# Patient Record
Sex: Male | Born: 1950
Health system: Southern US, Community
[De-identification: ages and names within clinical notes are randomized; demographics above are authoritative.]

## PROBLEM LIST (undated history)

## (undated) DIAGNOSIS — J9600 Acute respiratory failure, unspecified whether with hypoxia or hypercapnia: Secondary | ICD-10-CM

## (undated) DIAGNOSIS — R05 Cough: Secondary | ICD-10-CM

## (undated) DIAGNOSIS — K922 Gastrointestinal hemorrhage, unspecified: Secondary | ICD-10-CM

## (undated) DIAGNOSIS — I509 Heart failure, unspecified: Secondary | ICD-10-CM

## (undated) DIAGNOSIS — J189 Pneumonia, unspecified organism: Secondary | ICD-10-CM

## (undated) DIAGNOSIS — G4700 Insomnia, unspecified: Secondary | ICD-10-CM

## (undated) DIAGNOSIS — I729 Aneurysm of unspecified site: Secondary | ICD-10-CM

## (undated) DIAGNOSIS — E876 Hypokalemia: Secondary | ICD-10-CM

## (undated) DIAGNOSIS — R059 Cough, unspecified: Secondary | ICD-10-CM

## (undated) DIAGNOSIS — J449 Chronic obstructive pulmonary disease, unspecified: Secondary | ICD-10-CM

## (undated) DIAGNOSIS — M109 Gout, unspecified: Secondary | ICD-10-CM

## (undated) DIAGNOSIS — K219 Gastro-esophageal reflux disease without esophagitis: Secondary | ICD-10-CM

## (undated) DIAGNOSIS — R040 Epistaxis: Secondary | ICD-10-CM

## (undated) DIAGNOSIS — D649 Anemia, unspecified: Secondary | ICD-10-CM

## (undated) DIAGNOSIS — T7840XA Allergy, unspecified, initial encounter: Secondary | ICD-10-CM

## (undated) DIAGNOSIS — M6240 Contracture of muscle, unspecified site: Secondary | ICD-10-CM

## (undated) DIAGNOSIS — E785 Hyperlipidemia, unspecified: Secondary | ICD-10-CM

## (undated) DIAGNOSIS — J40 Bronchitis, not specified as acute or chronic: Secondary | ICD-10-CM

## (undated) DIAGNOSIS — I10 Essential (primary) hypertension: Secondary | ICD-10-CM

## (undated) DIAGNOSIS — I739 Peripheral vascular disease, unspecified: Secondary | ICD-10-CM

## (undated) DIAGNOSIS — L8990 Pressure ulcer of unspecified site, unspecified stage: Secondary | ICD-10-CM

## (undated) DIAGNOSIS — I82409 Acute embolism and thrombosis of unspecified deep veins of unspecified lower extremity: Secondary | ICD-10-CM

## (undated) HISTORY — PX: EYE SURGERY: SHX253

## (undated) HISTORY — DX: Allergy, unspecified, initial encounter: T78.40XA

## (undated) HISTORY — DX: Anemia, unspecified: D64.9

## (undated) HISTORY — DX: Gastrointestinal hemorrhage, unspecified: K92.2

## (undated) HISTORY — PX: CARDIAC CATHETERIZATION: SHX172

---

## 2001-10-25 ENCOUNTER — Inpatient Hospital Stay (HOSPITAL_COMMUNITY): Admission: EM | Admit: 2001-10-25 | Discharge: 2001-11-02 | Payer: Self-pay | Admitting: Emergency Medicine

## 2001-10-25 ENCOUNTER — Encounter: Payer: Self-pay | Admitting: Emergency Medicine

## 2001-10-26 ENCOUNTER — Encounter: Payer: Self-pay | Admitting: Internal Medicine

## 2001-10-29 ENCOUNTER — Encounter: Payer: Self-pay | Admitting: Internal Medicine

## 2012-11-29 ENCOUNTER — Emergency Department: Payer: Self-pay | Admitting: Emergency Medicine

## 2012-11-29 LAB — COMPREHENSIVE METABOLIC PANEL
Albumin: 3.7 g/dL (ref 3.4–5.0)
Calcium, Total: 9.1 mg/dL (ref 8.5–10.1)
Chloride: 104 mmol/L (ref 98–107)
Creatinine: 0.98 mg/dL (ref 0.60–1.30)
EGFR (Non-African Amer.): >60
Glucose: 129 mg/dL — ABNORMAL HIGH (ref 65–99)
Osmolality: 272 (ref 275–301)
Potassium: 5.3 mmol/L — ABNORMAL HIGH (ref 3.5–5.1)
SGPT (ALT): 28 U/L (ref 12–78)
Sodium: 135 mmol/L — ABNORMAL LOW (ref 136–145)
Total Protein: 8.2 g/dL (ref 6.4–8.2)

## 2012-11-29 LAB — URINALYSIS, COMPLETE
Hyaline Cast: 3
Ph: 5 (ref 4.5–8.0)
Protein: NEGATIVE
RBC,UR: 3 /HPF (ref 0–5)
Specific Gravity: 1.017 (ref 1.003–1.030)
Squamous Epithelial: NONE SEEN

## 2012-11-29 LAB — CBC
HGB: 15.1 g/dL (ref 13.0–18.0)
MCH: 28.4 pg (ref 26.0–34.0)
MCHC: 32.8 g/dL (ref 32.0–36.0)
Platelet: 230 10*3/uL (ref 150–440)
RBC: 5.32 10*6/uL (ref 4.40–5.90)
RDW: 15.6 % — ABNORMAL HIGH (ref 11.5–14.5)
WBC: 12.9 10*3/uL — ABNORMAL HIGH (ref 3.8–10.6)

## 2012-11-29 LAB — ETHANOL: Ethanol: <3 mg/dL

## 2013-03-18 ENCOUNTER — Ambulatory Visit: Payer: Self-pay | Admitting: Internal Medicine

## 2013-03-21 ENCOUNTER — Inpatient Hospital Stay: Payer: Self-pay | Admitting: Internal Medicine

## 2013-03-21 LAB — COMPREHENSIVE METABOLIC PANEL
Albumin: 1.5 g/dL — ABNORMAL LOW (ref 3.4–5.0)
Alkaline Phosphatase: 86 U/L (ref 50–136)
Anion Gap: 7 (ref 7–16)
BUN: 38 mg/dL — ABNORMAL HIGH (ref 7–18)
Bilirubin,Total: 0.8 mg/dL (ref 0.2–1.0)
Calcium, Total: 8.6 mg/dL (ref 8.5–10.1)
Chloride: 98 mmol/L (ref 98–107)
Co2: 29 mmol/L (ref 21–32)
Creatinine: 1.44 mg/dL — ABNORMAL HIGH (ref 0.60–1.30)
EGFR (African American): 60 — ABNORMAL LOW
EGFR (Non-African Amer.): 52 — ABNORMAL LOW
Glucose: 150 mg/dL — ABNORMAL HIGH (ref 65–99)
Osmolality: 280 (ref 275–301)
Potassium: 4.6 mmol/L (ref 3.5–5.1)
SGOT(AST): 63 U/L — ABNORMAL HIGH (ref 15–37)
SGPT (ALT): 32 U/L (ref 12–78)
Sodium: 134 mmol/L — ABNORMAL LOW (ref 136–145)
Total Protein: 8.2 g/dL (ref 6.4–8.2)

## 2013-03-21 LAB — TROPONIN I: Troponin-I: <0.02 ng/mL

## 2013-03-21 LAB — CBC
HCT: 32.9 % — ABNORMAL LOW (ref 40.0–52.0)
HGB: 11 g/dL — ABNORMAL LOW (ref 13.0–18.0)
MCH: 27.2 pg (ref 26.0–34.0)
MCHC: 33.4 g/dL (ref 32.0–36.0)
MCV: 81 fL (ref 80–100)
Platelet: 362 10*3/uL (ref 150–440)
RBC: 4.04 10*6/uL — ABNORMAL LOW (ref 4.40–5.90)
RDW: 13.7 % (ref 11.5–14.5)
WBC: 28 10*3/uL — ABNORMAL HIGH (ref 3.8–10.6)

## 2013-03-21 LAB — CK TOTAL AND CKMB (NOT AT ARMC)
CK, Total: 95 U/L (ref 35–232)
CK-MB: 1.5 ng/mL (ref 0.5–3.6)

## 2013-03-21 LAB — PRO B NATRIURETIC PEPTIDE: B-Type Natriuretic Peptide: 1862 pg/mL — ABNORMAL HIGH (ref 0–125)

## 2013-03-22 LAB — CBC WITH DIFFERENTIAL/PLATELET
Basophil %: 0.1 %
Eosinophil #: 0 10*3/uL (ref 0.0–0.7)
Eosinophil %: 0 %
HCT: 29.8 % — ABNORMAL LOW (ref 40.0–52.0)
Lymphocyte #: 0.9 10*3/uL — ABNORMAL LOW (ref 1.0–3.6)
Lymphocyte %: 3.7 %
MCH: 27 pg (ref 26.0–34.0)
MCHC: 33 g/dL (ref 32.0–36.0)
MCV: 82 fL (ref 80–100)
Monocyte #: 1.5 x10 3/mm — ABNORMAL HIGH (ref 0.2–1.0)
Monocyte %: 6.3 %
Neutrophil #: 21.7 10*3/uL — ABNORMAL HIGH (ref 1.4–6.5)
Platelet: 335 10*3/uL (ref 150–440)
RBC: 3.64 10*6/uL — ABNORMAL LOW (ref 4.40–5.90)
RDW: 13.8 % (ref 11.5–14.5)

## 2013-03-22 LAB — BASIC METABOLIC PANEL
Calcium, Total: 8 mg/dL — ABNORMAL LOW (ref 8.5–10.1)
Co2: 25 mmol/L (ref 21–32)
Creatinine: 1.03 mg/dL (ref 0.60–1.30)
EGFR (African American): >60
EGFR (Non-African Amer.): >60
Glucose: 157 mg/dL — ABNORMAL HIGH (ref 65–99)
Osmolality: 278 (ref 275–301)
Potassium: 4 mmol/L (ref 3.5–5.1)

## 2013-03-23 LAB — CBC WITH DIFFERENTIAL/PLATELET
Basophil %: 0.3 %
HCT: 31.4 % — ABNORMAL LOW (ref 40.0–52.0)
Lymphocyte #: 1 10*3/uL (ref 1.0–3.6)
Lymphocyte %: 4.9 %
MCV: 83 fL (ref 80–100)
Neutrophil #: 17.9 10*3/uL — ABNORMAL HIGH (ref 1.4–6.5)
RBC: 3.8 10*6/uL — ABNORMAL LOW (ref 4.40–5.90)
RDW: 14 % (ref 11.5–14.5)
WBC: 20.5 10*3/uL — ABNORMAL HIGH (ref 3.8–10.6)

## 2013-03-24 DIAGNOSIS — I369 Nonrheumatic tricuspid valve disorder, unspecified: Secondary | ICD-10-CM

## 2013-03-24 LAB — BASIC METABOLIC PANEL
Anion Gap: 6 — ABNORMAL LOW (ref 7–16)
BUN: 25 mg/dL — ABNORMAL HIGH (ref 7–18)
Calcium, Total: 8.2 mg/dL — ABNORMAL LOW (ref 8.5–10.1)
Co2: 31 mmol/L (ref 21–32)
Creatinine: 1.14 mg/dL (ref 0.60–1.30)
EGFR (African American): >60
EGFR (Non-African Amer.): >60
Glucose: 190 mg/dL — ABNORMAL HIGH (ref 65–99)
Osmolality: 287 (ref 275–301)

## 2013-03-24 LAB — PRO B NATRIURETIC PEPTIDE: B-Type Natriuretic Peptide: 7175 pg/mL — ABNORMAL HIGH (ref 0–125)

## 2013-03-25 LAB — CBC WITH DIFFERENTIAL/PLATELET
Basophil %: 0.2 %
Eosinophil #: 0 10*3/uL (ref 0.0–0.7)
Eosinophil %: 0 %
HCT: 29 % — ABNORMAL LOW (ref 40.0–52.0)
Lymphocyte #: 1.4 10*3/uL (ref 1.0–3.6)
MCHC: 33.1 g/dL (ref 32.0–36.0)
MCV: 81 fL (ref 80–100)
Neutrophil %: 89.4 %
Platelet: 354 10*3/uL (ref 150–440)
RBC: 3.59 10*6/uL — ABNORMAL LOW (ref 4.40–5.90)
WBC: 20.2 10*3/uL — ABNORMAL HIGH (ref 3.8–10.6)

## 2013-03-25 LAB — MAGNESIUM: Magnesium: 2.5 mg/dL — ABNORMAL HIGH

## 2013-03-25 LAB — PHOSPHORUS: Phosphorus: 2.5 mg/dL (ref 2.5–4.9)

## 2013-03-25 LAB — COMPREHENSIVE METABOLIC PANEL
Anion Gap: 6 — ABNORMAL LOW (ref 7–16)
BUN: 27 mg/dL — ABNORMAL HIGH (ref 7–18)
Bilirubin,Total: 0.3 mg/dL (ref 0.2–1.0)
Chloride: 101 mmol/L (ref 98–107)
Co2: 29 mmol/L (ref 21–32)
Creatinine: 1.07 mg/dL (ref 0.60–1.30)
EGFR (Non-African Amer.): >60
Glucose: 148 mg/dL — ABNORMAL HIGH (ref 65–99)
Total Protein: 6.5 g/dL (ref 6.4–8.2)

## 2013-03-25 LAB — VANCOMYCIN, TROUGH: Vancomycin, Trough: 24 ug/mL (ref 10–20)

## 2013-03-25 LAB — TRIGLYCERIDES: Triglycerides: 177 mg/dL (ref 0–200)

## 2013-03-26 LAB — BASIC METABOLIC PANEL
Anion Gap: 4 — ABNORMAL LOW (ref 7–16)
BUN: 22 mg/dL — ABNORMAL HIGH (ref 7–18)
Calcium, Total: 7.8 mg/dL — ABNORMAL LOW (ref 8.5–10.1)
Co2: 33 mmol/L — ABNORMAL HIGH (ref 21–32)
Creatinine: 1.05 mg/dL (ref 0.60–1.30)
EGFR (African American): >60
EGFR (Non-African Amer.): >60
Glucose: 167 mg/dL — ABNORMAL HIGH (ref 65–99)
Osmolality: 279 (ref 275–301)
Potassium: 3.7 mmol/L (ref 3.5–5.1)
Sodium: 136 mmol/L (ref 136–145)

## 2013-03-26 LAB — CBC WITH DIFFERENTIAL/PLATELET
Basophil %: 0.1 %
Eosinophil %: 0 %
HCT: 28.7 % — ABNORMAL LOW (ref 40.0–52.0)
HGB: 9.6 g/dL — ABNORMAL LOW (ref 13.0–18.0)
Lymphocyte #: 1.4 10*3/uL (ref 1.0–3.6)
Lymphocyte %: 7.3 %
MCH: 26.8 pg (ref 26.0–34.0)
MCHC: 33.4 g/dL (ref 32.0–36.0)
MCV: 80 fL (ref 80–100)
Monocyte %: 4 %
Neutrophil %: 88.6 %
RBC: 3.58 10*6/uL — ABNORMAL LOW (ref 4.40–5.90)
RDW: 13.8 % (ref 11.5–14.5)
WBC: 19 10*3/uL — ABNORMAL HIGH (ref 3.8–10.6)

## 2013-03-26 LAB — CULTURE, BLOOD (SINGLE)

## 2013-03-27 LAB — CBC WITH DIFFERENTIAL/PLATELET
Basophil #: 0.1 10*3/uL (ref 0.0–0.1)
Basophil %: 0.4 %
Eosinophil #: 0 10*3/uL (ref 0.0–0.7)
Eosinophil %: 0.1 %
HGB: 9.8 g/dL — ABNORMAL LOW (ref 13.0–18.0)
Lymphocyte %: 14.3 %
MCH: 27.1 pg (ref 26.0–34.0)
MCV: 80 fL (ref 80–100)
Neutrophil %: 80.4 %
Platelet: 400 10*3/uL (ref 150–440)
RBC: 3.62 10*6/uL — ABNORMAL LOW (ref 4.40–5.90)
RDW: 13.9 % (ref 11.5–14.5)
WBC: 18.4 10*3/uL — ABNORMAL HIGH (ref 3.8–10.6)

## 2013-03-27 LAB — BASIC METABOLIC PANEL
BUN: 28 mg/dL — ABNORMAL HIGH (ref 7–18)
Calcium, Total: 7.8 mg/dL — ABNORMAL LOW (ref 8.5–10.1)
Chloride: 98 mmol/L (ref 98–107)
Co2: 36 mmol/L — ABNORMAL HIGH (ref 21–32)
EGFR (African American): >60
Glucose: 100 mg/dL — ABNORMAL HIGH (ref 65–99)
Osmolality: 279 (ref 275–301)
Potassium: 3.5 mmol/L (ref 3.5–5.1)

## 2013-03-28 LAB — BASIC METABOLIC PANEL
Anion Gap: 7 (ref 7–16)
BUN: 29 mg/dL — ABNORMAL HIGH (ref 7–18)
Calcium, Total: 8 mg/dL — ABNORMAL LOW (ref 8.5–10.1)
Co2: 34 mmol/L — ABNORMAL HIGH (ref 21–32)
Creatinine: 1.13 mg/dL (ref 0.60–1.30)
Glucose: 131 mg/dL — ABNORMAL HIGH (ref 65–99)
Osmolality: 283 (ref 275–301)
Sodium: 138 mmol/L (ref 136–145)

## 2013-03-28 LAB — CBC WITH DIFFERENTIAL/PLATELET
Eosinophil %: 0.1 %
Lymphocyte #: 2.8 10*3/uL (ref 1.0–3.6)
MCV: 80 fL (ref 80–100)
Monocyte %: 5.6 %
RBC: 3.69 10*6/uL — ABNORMAL LOW (ref 4.40–5.90)
RDW: 14.1 % (ref 11.5–14.5)

## 2013-03-28 LAB — CLOSTRIDIUM DIFFICILE BY PCR

## 2013-03-29 LAB — CBC WITH DIFFERENTIAL/PLATELET
Lymphocytes: 15 %
MCHC: 33 g/dL (ref 32.0–36.0)
Platelet: 429 10*3/uL (ref 150–440)
RBC: 3.77 10*6/uL — ABNORMAL LOW (ref 4.40–5.90)
RDW: 13.8 % (ref 11.5–14.5)
Segmented Neutrophils: 74 %
Variant Lymphocyte - H1-Rlymph: 1 %
WBC: 25.2 10*3/uL — ABNORMAL HIGH (ref 3.8–10.6)

## 2013-03-29 LAB — BASIC METABOLIC PANEL
BUN: 29 mg/dL — ABNORMAL HIGH (ref 7–18)
Creatinine: 1.18 mg/dL (ref 0.60–1.30)
EGFR (African American): >60
EGFR (Non-African Amer.): >60
Osmolality: 285 (ref 275–301)
Sodium: 139 mmol/L (ref 136–145)

## 2013-03-29 LAB — MAGNESIUM: Magnesium: 2.4 mg/dL

## 2013-03-29 LAB — PHOSPHORUS: Phosphorus: 3.9 mg/dL (ref 2.5–4.9)

## 2013-03-30 LAB — CBC WITH DIFFERENTIAL/PLATELET
Bands: 2 %
HCT: 30.8 % — ABNORMAL LOW (ref 40.0–52.0)
Lymphocytes: 21 %
MCHC: 33.1 g/dL (ref 32.0–36.0)
MCV: 81 fL (ref 80–100)
Metamyelocyte: 2 %
Monocytes: 6 %
RBC: 3.82 10*6/uL — ABNORMAL LOW (ref 4.40–5.90)
Segmented Neutrophils: 67 %

## 2013-03-31 LAB — BASIC METABOLIC PANEL
Anion Gap: 5 — ABNORMAL LOW (ref 7–16)
BUN: 35 mg/dL — ABNORMAL HIGH (ref 7–18)
Calcium, Total: 8.3 mg/dL — ABNORMAL LOW (ref 8.5–10.1)
Co2: 34 mmol/L — ABNORMAL HIGH (ref 21–32)
EGFR (African American): >60
EGFR (Non-African Amer.): >60
Glucose: 105 mg/dL — ABNORMAL HIGH (ref 65–99)
Osmolality: 286 (ref 275–301)
Sodium: 139 mmol/L (ref 136–145)

## 2013-03-31 LAB — CBC WITH DIFFERENTIAL/PLATELET
Basophil #: 0 10*3/uL (ref 0.0–0.1)
Eosinophil #: 0.2 10*3/uL (ref 0.0–0.7)
HCT: 28.9 % — ABNORMAL LOW (ref 40.0–52.0)
HGB: 9.7 g/dL — ABNORMAL LOW (ref 13.0–18.0)
Lymphocyte #: 1.6 10*3/uL (ref 1.0–3.6)
MCH: 27 pg (ref 26.0–34.0)
MCHC: 33.7 g/dL (ref 32.0–36.0)
MCV: 80 fL (ref 80–100)
Monocyte #: 1 x10 3/mm (ref 0.2–1.0)
Neutrophil %: 87.8 %
Platelet: 432 10*3/uL (ref 150–440)
RBC: 3.61 10*6/uL — ABNORMAL LOW (ref 4.40–5.90)

## 2013-03-31 LAB — MAGNESIUM: Magnesium: 2.4 mg/dL

## 2013-04-01 LAB — POTASSIUM: Potassium: 3.1 mmol/L — ABNORMAL LOW (ref 3.5–5.1)

## 2013-04-01 LAB — MAGNESIUM: Magnesium: 2.2 mg/dL

## 2013-04-03 LAB — CBC WITH DIFFERENTIAL/PLATELET
Basophil #: 0.1 10*3/uL (ref 0.0–0.1)
Eosinophil %: 1.2 %
HCT: 30.2 % — ABNORMAL LOW (ref 40.0–52.0)
HGB: 10 g/dL — ABNORMAL LOW (ref 13.0–18.0)
Lymphocyte %: 16.1 %
MCHC: 32.9 g/dL (ref 32.0–36.0)
MCV: 81 fL (ref 80–100)
Monocyte %: 6.5 %
Neutrophil #: 14.8 10*3/uL — ABNORMAL HIGH (ref 1.4–6.5)
Neutrophil %: 75.7 %
Platelet: 362 10*3/uL (ref 150–440)
RBC: 3.73 10*6/uL — ABNORMAL LOW (ref 4.40–5.90)
RDW: 14.6 % — ABNORMAL HIGH (ref 11.5–14.5)

## 2013-04-03 LAB — EXPECTORATED SPUTUM ASSESSMENT W GRAM STAIN, RFLX TO RESP C

## 2013-04-03 LAB — CLOSTRIDIUM DIFFICILE BY PCR

## 2013-04-04 LAB — BASIC METABOLIC PANEL
Anion Gap: 5 — ABNORMAL LOW (ref 7–16)
BUN: 25 mg/dL — ABNORMAL HIGH (ref 7–18)
Calcium, Total: 8.2 mg/dL — ABNORMAL LOW (ref 8.5–10.1)
Chloride: 107 mmol/L (ref 98–107)
Co2: 31 mmol/L (ref 21–32)
EGFR (African American): >60
Osmolality: 288 (ref 275–301)
Potassium: 3.3 mmol/L — ABNORMAL LOW (ref 3.5–5.1)
Sodium: 143 mmol/L (ref 136–145)

## 2013-04-18 ENCOUNTER — Ambulatory Visit: Payer: Self-pay | Admitting: Internal Medicine

## 2013-04-28 ENCOUNTER — Ambulatory Visit: Payer: Self-pay | Admitting: Vascular Surgery

## 2013-04-28 LAB — BASIC METABOLIC PANEL
Anion Gap: 4 — ABNORMAL LOW (ref 7–16)
Calcium, Total: 9.4 mg/dL (ref 8.5–10.1)
Co2: 31 mmol/L (ref 21–32)
EGFR (African American): >60
Glucose: 88 mg/dL (ref 65–99)
Osmolality: 277 (ref 275–301)
Potassium: 4.3 mmol/L (ref 3.5–5.1)
Sodium: 139 mmol/L (ref 136–145)

## 2013-05-05 ENCOUNTER — Ambulatory Visit: Payer: Self-pay | Admitting: Vascular Surgery

## 2013-05-05 LAB — CREATININE, SERUM
Creatinine: 0.97 mg/dL (ref 0.60–1.30)
EGFR (African American): >60
EGFR (Non-African Amer.): >60

## 2013-05-05 LAB — BUN: BUN: 12 mg/dL (ref 7–18)

## 2013-07-04 ENCOUNTER — Ambulatory Visit: Payer: Self-pay | Admitting: Vascular Surgery

## 2013-07-04 LAB — CREATININE, SERUM
EGFR (African American): >60
EGFR (Non-African Amer.): >60

## 2013-08-22 ENCOUNTER — Ambulatory Visit: Payer: Self-pay | Admitting: Vascular Surgery

## 2013-08-22 LAB — CREATININE, SERUM
CREATININE: 0.98 mg/dL (ref 0.60–1.30)
EGFR (Non-African Amer.): >60

## 2013-08-22 LAB — BUN: BUN: 20 mg/dL — AB (ref 7–18)

## 2013-12-01 ENCOUNTER — Ambulatory Visit: Payer: Self-pay | Admitting: Vascular Surgery

## 2013-12-01 LAB — CBC
HCT: 43.9 % (ref 40.0–52.0)
HGB: 14.3 g/dL (ref 13.0–18.0)
MCH: 27.4 pg (ref 26.0–34.0)
MCHC: 32.5 g/dL (ref 32.0–36.0)
MCV: 84 fL (ref 80–100)
Platelet: 250 10*3/uL (ref 150–440)
RBC: 5.2 10*6/uL (ref 4.40–5.90)
RDW: 13.9 % (ref 11.5–14.5)
WBC: 11.1 10*3/uL — ABNORMAL HIGH (ref 3.8–10.6)

## 2013-12-01 LAB — BASIC METABOLIC PANEL
Anion Gap: 3 — ABNORMAL LOW (ref 7–16)
BUN: 19 mg/dL — AB (ref 7–18)
CHLORIDE: 100 mmol/L (ref 98–107)
Calcium, Total: 9.4 mg/dL (ref 8.5–10.1)
Co2: 33 mmol/L — ABNORMAL HIGH (ref 21–32)
Creatinine: 1.29 mg/dL (ref 0.60–1.30)
EGFR (African American): >60
EGFR (Non-African Amer.): 59 — ABNORMAL LOW
Glucose: 91 mg/dL (ref 65–99)
Osmolality: 274 (ref 275–301)
Potassium: 3.9 mmol/L (ref 3.5–5.1)
SODIUM: 136 mmol/L (ref 136–145)

## 2013-12-05 ENCOUNTER — Ambulatory Visit: Payer: Self-pay | Admitting: Vascular Surgery

## 2013-12-05 LAB — CREATININE, SERUM
CREATININE: 0.98 mg/dL (ref 0.60–1.30)
EGFR (African American): >60
EGFR (Non-African Amer.): >60

## 2013-12-05 LAB — BUN: BUN: 20 mg/dL — AB (ref 7–18)

## 2013-12-07 ENCOUNTER — Ambulatory Visit: Payer: Self-pay | Admitting: Vascular Surgery

## 2013-12-08 LAB — PATHOLOGY REPORT

## 2014-03-16 ENCOUNTER — Ambulatory Visit: Payer: Self-pay | Admitting: Vascular Surgery

## 2014-03-16 LAB — BUN: BUN: 11 mg/dL (ref 7–18)

## 2014-03-16 LAB — CREATININE, SERUM
Creatinine: 1.2 mg/dL (ref 0.60–1.30)
EGFR (Non-African Amer.): >60

## 2014-08-14 ENCOUNTER — Ambulatory Visit: Payer: Self-pay | Admitting: Vascular Surgery

## 2014-08-14 LAB — CREATININE, SERUM
Creatinine: 1.19 mg/dL (ref 0.60–1.30)
EGFR (African American): >60
EGFR (Non-African Amer.): >60

## 2014-08-14 LAB — BUN: BUN: 15 mg/dL (ref 7–18)

## 2014-09-25 ENCOUNTER — Ambulatory Visit: Payer: Self-pay | Admitting: Vascular Surgery

## 2014-09-25 LAB — CREATININE, SERUM
Creatinine: 1.29 mg/dL (ref 0.60–1.30)
EGFR (African American): >60
GFR CALC NON AF AMER: 60 — AB

## 2014-09-25 LAB — BUN: BUN: 24 mg/dL — ABNORMAL HIGH (ref 7–18)

## 2014-12-08 NOTE — Consult Note (Signed)
PATIENT NAME:  Timothy Juarez, Timothy Juarez MR#:  [id number] DATE OF BIRTH:  January 20, 1951  DATE OF CONSULTATION:  03/27/2013  REFERRING PHYSICIAN:  Dr. Posey Pronto CONSULTING PHYSICIAN:  Corey Skains, MD  REASON FOR CONSULTATION: Acute systolic congestive heart failure, septic shock, left pneumonia and respiratory failure.   CHIEF COMPLAINT: The patient is intubated.    HISTORY OF PRESENT ILLNESS: This is a 64 year old male who is disheveled and has had no apparent cardiovascular history who has had new onset left lower pneumonia and respiratory failure with hypotension and sepsis. During his hypotension and sepsis, the patient has been using Levophed and intravenous fluids, although has some cough by basilar pulmonary edema. The patient has had no evidence of myocardial infarction with a normal troponin and currently has some anemia with a hemoglobin of 9.8. The patient does have reasonable heart rate control at 70 beats per minute. The patient now has had reasonable oxygenation. He has had an echocardiogram showing severe LV systolic dysfunction with a global fashion with moderate mitral and tricuspid regurgitation likely contributing to his hypotension.   Remainder review of systems cannot be assessed due to intubation.   PAST MEDICAL HISTORY:  Pneumonia.   FAMILY HISTORY: No apparent family history of cardiovascular disease.   SOCIAL HISTORY: The patient has an unknown tobacco or alcohol use.   ALLERGIES: As listed.   MEDICATIONS: As listed.   PHYSICAL EXAMINATION: VITAL SIGNS: Blood pressure is 90/60, heart rate is 78 and regular.  GENERAL: He is an intubated, disheveled elderly male in no acute distress.  HEENT: No icterus, thyromegaly, ulcers, hemorrhage, or xanthelasma.   CARDIOVASCULAR: Regular rate and rhythm. Normal S1, S2. Two out of six apical murmur consistent with mitral regurgitation. PMI is diffuse. Carotid upstroke normal without bruit. Jugular venous pressure is normal.  LUNGS:  Lungs have bibasilar decreased breath sounds and crackles with some expiratory rhonchi.  ABDOMEN: Soft.  EXTREMITIES: Trace lower extremity edema, but mainly upper extremity of 1+. NEUROLOGIC: The patient is intubated and sedated.   ASSESSMENT: A 64 year old male with no apparent history of cardiovascular disease with left pneumonia and respiratory failure with sepsis without evidence of acute myocardial infarction with acute systolic congestive heart failure with pulmonary edema.   RECOMMENDATIONS: 1.  No additional beta blocker, ACE inhibitor at this time due to sepsis and hypotension.  2.  Gentle diuresis if able and/or if necessary at a later date if hypoxia has occurred, although with intubation, would continue oxygenation to help  above.  3.  Further treatment of sepsis and cardio and left lower pneumonia.  4.  No further cardiac diagnostics necessary at this time until further evaluation and after recovery from above.   ____________________________ Corey Skains, MD bjk:cc D: 03/27/2013 20:05:45 ET T: 03/27/2013 21:49:50 ET JOB#: [id number]  cc: Corey Skains, MD, <Dictator> Corey Skains MD ELECTRONICALLY SIGNED 03/28/2013 7:31

## 2014-12-08 NOTE — Discharge Summary (Signed)
PATIENT NAME:  Timothy Juarez, Timothy Juarez MR#:  [id number] DATE OF BIRTH:  1951/04/29  DATE OF ADMISSION:  03/21/2013 DATE OF DISCHARGE:  04/07/2013   Please refer to the interim discharge summary done on August 12th by Dr. Vianne Bulls and then Dr. Bridgette Habermann on August 20th.   ADMITTING DIAGNOSIS: Shortness of breath.   DISCHARGE DIAGNOSES:  1. Acute respiratory failure due to acute chronic obstructive pulmonary disease exacerbation with pneumonia, with acute systolic congestive heart failure.  2. Acute chronic obstructive pulmonary disease exacerbation.  3. Previous history of emphysema.  4. Septic shock secondary to pneumonia, now resolved.  5. Acute renal failure, felt to be due to acute tubular necrosis.  6. Hypokalemia with premature ventricular contractions.  7. Acute congestive heart failure with an ejection fraction of 20% to 25%, with the patient's blood pressure being low, unable to do angiotensin-converting enzyme or Coreg.  8. Tobacco abuse. The patient counseled during the hospitalization.   PERTINENT EVALUATIONS: Most recent BMP: Glucose was 79, BUN 25, creatinine 1.16, sodium 143, potassium 3.3, chloride was 107, CO2 was 31, calcium was 8.2. His WBC count was 19.6 on August 17, hemoglobin 10, platelet count 362. C. difficile was negative on August  17th. Sputum culture showed no growth. CT of the chest with contrast on August 19th showed COPD with areas of atelectasis, bronchiectasis with bullous changes in the left lung.   HOSPITAL COURSE: Please refer 2 interim summaries, done initially by Dr. Vianne Bulls and then Dr. Bridgette Habermann. Also refer to their labs and evaluations done by them.   The patient is a 64 year old African-American male who apparently was not on any medications, who presented to the hospital on August 4th with shortness of breath. He does have a history of tobacco abuse for 40 years. The patient's white blood cell count when he was brought to the ED was 28,000, with left upper  lobe infiltrate. The patient was admitted for acute respiratory failure, started on vancomycin and Zosyn. He was getting nebulizers. Then, his respiratory status got worse during the hospitalization, and he became unresponsive and had hypoxic hypercarbic respiratory failure requiring intubation. The patient was emergently transferred to the ICU. He was kept on the ventilator and was hard to wean off the ventilator. He failed multiple attempts. Dr. Mortimer Fries spoke with the family. They did not want trach or PEG. The patient was subsequently terminally extubated after being seen by palliative care. However, he started to improve, and now, he is on room air. The patient is very weak and deconditioned, and he needs further rehab. The patient also received IV antibiotics during the hospitalization for his pneumonia. He is not having any fevers. He also had an echocardiogram which showed a decreased EF on his echo, and due to his blood pressure being low, he could not tolerate ACE or Coreg. The patient also wanted to be a DNR, which is continued per his wishes. At this time, he is being arranged for discharge to a rehab facility. Discharge instructions for CHF given.   DISCHARGE MEDICATIONS:  1. Alprazolam 0.25 q.6 p.r.n. for anxiety. 2. Albuterol Atrovent nebulizers q.6 scheduled and use p.r.n. if he is doing okay with his respiratory status.  3. Advair 250/50 one puff b.i.d.  4. Tiotropium 18 mcg daily.  5. Guaifenesin 10 mL q.6 p.r.n.  6. Ensure 240 mL 3 times a day.   DIET: Low-sodium. Diet consistency: Mechanical soft, thin liquids, with aspiration precautions.   ACTIVITY: As tolerated. PT evaluation and treat.   FOLLOWUP:  Follow with [facility] in 2 to 4 weeks once discharged from the nursing home.    TIME SPENT: Note, 45 minutes spent on his discharge.   ____________________________ Lafonda Mosses. Posey Pronto, MD shp:OSi D: 04/07/2013 13:33:19 ET T: 04/07/2013 13:59:31  ET JOB#: [id number]  cc: Aubryanna Nesheim H. Posey Pronto, MD, <Dictator> Alric Seton MD ELECTRONICALLY SIGNED 04/12/2013 18:22

## 2014-12-08 NOTE — Op Note (Signed)
PATIENT NAME:  Timothy Juarez, Timothy Juarez MR#:  [id number] DATE OF BIRTH:  1951-03-01  DATE OF PROCEDURE:  07/04/2013  PREOPERATIVE DIAGNOSES: 1.  Peripheral arterial disease with gangrene, bilateral lower extremities.  2.  Hypertension  3.  Chronic obstructive pulmonary disease.   POSTOPERATIVE DIAGNOSES: 1.  Peripheral arterial disease with gangrene, bilateral lower extremities.  2.  Hypertension  3.  Chronic obstructive pulmonary disease.   PROCEDURES: 1.  Ultrasound guidance for vascular access, left femoral artery.  2.  Catheter placement to right anterior tibial and right peroneal arteries, left femoral approach.  3.  Aortogram and selective right lower extremity angiogram.  4.  Percutaneous transluminal angioplasty of right tibioperoneal trunk and peroneal artery with 3 mm diameter angioplasty balloon.  5.  Percutaneous transluminal angioplasty of proximal right anterior tibial artery.  6.  StarClose closure device, left femoral artery.   SURGEON: [name], M.D.   ANESTHESIA: Local with moderate conscious sedation.   ESTIMATED BLOOD LOSS: 25 mL.   FLUOROSCOPY TIME: Approximately 8 minutes and 70 mL of contrast was used.   INDICATION FOR PROCEDURE: This is a 64 year old gentleman with gangrenous feet. He has had bilateral lower extremity interventions. His left lower extremity perfusion appears good at this time. His right lower extremity has improved perfusion, but not normal.  We are attempting to improve this. Noninvasive study showed a couple of areas of stenosis that may be able to be improved upon, and he is brought in for angiography for further evaluation and treatment. Risks and benefits were discussed. Informed consent was obtained.   DESCRIPTION OF PROCEDURE: The patient is brought to the vascular interventional radiology suite. Groins were shaved and prepped and a sterile surgical field was created. Due to his previous accesses, ultrasound was used to visualize a patent left  femoral artery and access was obtained without difficulty with a Seldinger needle. A J-wire and 5-French sheath were placed. Pigtail catheter was placed in the aortic L1 and L2 level and AP aortogram is performed. This showed normal renal arteries, normal aorta and iliac segments. I then hooked the aortic bifurcation and advanced to the right femoral head. Selective right lower extremity angiogram was then performed. This demonstrated the right SFA to be patent. The previously placed SFA stent was patent. The popliteal artery had some mild irregularity but not flow limiting. He then had a moderate stenosis in the proximal portion of the anterior tibial artery. This is the dominant runoff distally. The peroneal artery had a near occlusive stenosis at its origin and in the tibioperoneal trunk. I decided to try to treat both tibial vessels to improve perfusion. The patient was heparinized. A 6-French Ansel sheath was placed over a Terumo Advantage wire and CXI catheter was exchanged for a 0.018 wire that was used. I initially got across the peroneal lesion and parked a wire into a the distal peroneal artery. A 3 mm diameter angioplasty balloon was inflated from the mid peroneal artery up encompassing the entire tibioperoneal trunk back to the popliteal artery. Waists were taken, which resolved with angioplasty, and an additional angiogram following this showed significantly improved flow through the peroneal artery. I then turned my attention to the anterior tibial artery, which was the dominant runoff initially. A wire and the CXI catheter were used to help gain access and cross the proximal stenosis. A 3 mm diameter angioplasty balloon was inflated in the anterior tibial artery with completion angiogram now showing the anterior tibial artery,  again, being the dominant flow  to the foot. At this point, I elected to terminate the procedure, and the sheath was pulled back to the ipsilateral external iliac artery and an  oblique arteriogram was performed. StarClose closure device was deployed in the usual fashion with excellent hemostatic result. The patient tolerated the procedure well and was taken to the recovery room in stable condition.     ____________________________ Algernon Huxley, MD jsd:dmm D: 07/04/2013 12:16:13 ET T: 07/04/2013 12:43:03 ET JOB#: [id number]  cc: Algernon Huxley, MD, <Dictator> Marisa Hua, MD Algernon Huxley MD ELECTRONICALLY SIGNED 07/06/2013 8:42

## 2014-12-08 NOTE — Op Note (Signed)
PATIENT NAME:  Timothy Juarez, Timothy Juarez MR#:  [id number] DATE OF BIRTH:  July 29, 1951  DATE OF PROCEDURE:  04/28/2013  PREOPERATIVE DIAGNOSES: 1.  Peripheral arterial disease with ulceration and gangrene to bilateral lower extremities.  2.  Chronic obstructive pulmonary disease.  3.  Urinary tract infections.  POSTOPERATIVE DIAGNOSES:  1.  Peripheral arterial disease with ulceration and gangrene to bilateral lower extremities.  2.  Chronic obstructive pulmonary disease.  3.  Urinary tract infections.  PROCEDURES: 1.  Ultrasound guidance for vascular access, left femoral artery.  2.  Catheter placement to right anterior tibial artery from left femoral approach.  3.  Aortogram and selective right lower extremity angiogram.  4.  Percutaneous transluminal angioplasty of nearly entire right anterior tibial artery with 3 mm diameter angioplasty balloon.  5.  Percutaneous transluminal angioplasty of right popliteal artery with 5 mm diameter angioplasty balloon.  6.  Percutaneous transluminal angioplasty of mid right superficial femoral artery for separate and distinct lesion with 5 mm diameter angioplasty balloon.  7.  Placement of 6 mm diameter self-expanding stent into the right superficial femoral artery for greater than 50% residual stenosis and dissection after angioplasty.  8.  StarClose closure device, left femoral artery.   SURGEON: Algernon Huxley, M.D.   ANESTHESIA: Local with moderate conscious sedation.   ESTIMATED BLOOD LOSS: Minimal.   INDICATION FOR PROCEDURE: This is a 64 year old African American male who has been in the hospital in critical condition for several weeks. He has developed severe bilateral foot ulcers and gangrenous changes to several toes. He has heal ulceration and ankle ulcerations. He has multiple wounds on both feet and is at high risk of limb loss. To address his arterial insufficiency, angiogram is performed in hope of limb salvage. The risks and benefits were  discussed and informed consent was obtained. It is known that the legs will have to be treated in a staged fashion.   DESCRIPTION OF PROCEDURE: The patient is brought to the vascular and interventional radiology suite. Groins were shaved and prepped and a sterile surgical field was created. The left femoral artery was visualized with ultrasound and found to widely patent. It was then accessed under direct ultrasound guidance without difficulty with a Seldinger needle. A J-wire and 5-French sheath were placed. A pigtail catheter was then placed in the aorta at the L1-L2 level and AP aortogram was performed. This showed no flow-limiting stenosis in the aortoiliac segments. I then hooked the aortic bifurcation and advanced to the right femoral head and selective right lower extremity angiogram was then performed. This demonstrated about a 70% stenosis in a focal segment of the mid superficial femoral artery. The popliteal artery had moderate disease in the 50 to 60% range around the knee and then there was severe tibial disease. All 3 vessels were occluded at some point. The peroneal artery and anterior tibial artery is reconstituted distally and the anterior tibial artery was the largest of 3 arteries. The patient was systemically heparinized and a 6-French Ansell sheath was placed over a Terumo Advantage wire. I crossed the SFA and popliteal lesions without difficulty. I probed the peroneal artery but was unable to gain access and cross the occlusions in the peroneal artery. I then turned my attention to the anterior tibial artery. With a CXI catheter and an 0.018 wire I was able to cross the occlusion and gain access all the way into the foot, in the dorsalis pedis artery. The 0.018 wire was then placed and the catheter removed  and a 3 mm diameter angioplasty balloon was inflated from the dorsalis pedis artery up to the origin of the anterior tibial artery. Multiple areas of wastes were taken which resolved with  angioplasty. Completion angiogram following angioplasty showed in-line flow in the anterior tibial artery into the foot. I then treated the popliteal artery and the superficial femoral artery which was a separate and distinct lesion both with inflations with a 5 mm diameter angioplasty balloon. Inflation in each location was held for approximately 1 minute. The popliteal artery had about a 40% residual stenosis after angioplasty that did not appear flow-limiting, and I elected not to treat this as it was right around the knee and not flow limiting. The mid superficial femoral artery had a dissection and greater than 50% residual stenosis after angioplasty with essentially no improvement in flow after the initial angioplasty. I elected to treat this area with a 6 mm diameter x 6 cm in length self-expanding stent. This was ironed out with a 5 mm balloon. Completion angiogram following stent placement showed this now to be widely patent with significantly improved flow, and I elected to terminate the procedure. The sheath was pulled back to the ipsilateral external iliac artery and oblique arteriogram was performed. StarClose closure device was deployed in the usual fashion with excellent hemostatic result. The patient tolerated the procedure well and was taken to the recovery room in stable condition.  ____________________________ Algernon Huxley, MD jsd:sb D: 04/28/2013 15:57:35 ET T: 04/28/2013 17:00:11 ET JOB#: [id number]  cc: Algernon Huxley, MD, <Dictator> Algernon Huxley MD ELECTRONICALLY SIGNED 05/11/2013 11:38

## 2014-12-08 NOTE — H&P (Signed)
PATIENT NAME:  Timothy Juarez, Timothy Juarez MR#:  [id number] DATE OF BIRTH:  1951-04-10  DATE OF ADMISSION:  03/21/2013  PRIMARY CARE PHYSICIAN: Does not have one.   CHIEF COMPLAINT: Shortness of breath.   HISTORY OF PRESENT ILLNESS: This is a 64 year old male who presented to the hospital due to shortness of breath, progressively getting worse over the past week or so. The patient has a long-time history of tobacco abuse for about 40 years. He has not seen a doctor in many, many years and denies any history of chronic medical problems. He presented to the hospital with worsening shortness of breath and audible wheezing and productive sputum, which has been green-yellow in color. The patient in the ER was noted to have a leukocytosis with white cell count of 28,000 and also noted to have a significant left upper lobe infiltrate. Hospital services were contacted for further treatment and evaluation. The patient does admit to a cough, which has been productive with green-yellow sputum, and does admit to some pleuritic chest pain. No nausea. No vomiting. No abdominal pain. No weight loss. No night sweats. No hemoptysis. No other associated symptoms presently.   REVIEW OF SYSTEMS:    CONSTITUTIONAL: No documented fever. Positive weakness and fatigue. No weight loss.  EYES: No blurred or double vision.  EARS, NOSE, THROAT: No tinnitus. No postnasal drip. No redness of the oropharynx.  RESPIRATORY: Positive cough. Positive wheeze. Positive COPD. No hemoptysis.  CARDIOVASCULAR: Positive pleuritic chest pain. No orthopnea, no palpitations, no syncope.  GASTROINTESTINAL: No nausea, no vomiting, no diarrhea. No abdominal pain, no melena, no hematochezia.  GENITOURINARY: No dysuria, no hematuria.  ENDOCRINE: No polyuria, nocturia, heat or heat intolerance.  HEMATOLOGIC: No anemia, no bruising, no bleeding.  INTEGUMENTARY: No rashes, no lesions.  MUSCULOSKELETAL: No arthritis, no swelling, no gout.  NEUROLOGIC: No  numbness or tingling. No ataxia. No seizure-type activity.  PSYCHIATRIC: No anxiety, no insomnia, no ADD.   PAST MEDICAL HISTORY: He has no significant past medical history.   ALLERGIES: No known drug allergies.   SOCIAL HISTORY: Does smoke about 1/2 pack per day, has smoked for the past 40+ years. Occasional alcohol use. No illicit drug abuse. Lives at home with his aunt.   FAMILY HISTORY: Mother died from an MI. Father died from a brain tumor.   CURRENT MEDICATIONS: He is currently on no medications.   PHYSICAL EXAMINATION:  VITAL SIGNS: Temperature is 98.5, pulse 110, respirations 20, blood pressure 103/55, sats 95% on 2 liters nasal cannula.  GENERAL: He is a very unkempt-appearing male, cachectic-appearing, but in no apparent distress.  HEENT: He is atraumatic, normocephalic. His extraocular muscles are intact. His pupils are equal and reactive to light. His sclerae are anicteric. There is no conjunctival injection. No oropharyngeal erythema. He has significant plaque in his dentition, very poor dentition.  NECK: Supple. There is no jugular venous distention. No bruits, no lymphadenopathy, no thyromegaly.  HEART: Tachycardic, regular. No murmurs, no rubs, no clicks.  LUNGS: He has some coarse rhonchi and wheezing diffusely, more on the left than the right. Negative use of accessory muscles. No dullness to percussion. Good air entry bilaterally.  ABDOMEN: Soft, flat, nontender, nondistended. Has good bowel sounds. No hepatosplenomegaly appreciated.  EXTREMITIES: No evidence of any cyanosis. Positive clubbing. No peripheral edema, +2 pedal and radial pulses bilaterally.  NEUROLOGICAL: The patient is alert, awake, oriented x3 with no focal motor or sensory deficits appreciated bilaterally.  SKIN: Moist and warm with no rashes appreciated.  LYMPHATIC:  There is no cervical or axillary lymphadenopathy.   LABORATORY AND RADIOLOGICAL DATA: Serum glucose of 150, BUN 38, creatinine 1.4, sodium  134, potassium 4.6, chloride 98, bicarbonate 29. The patient's albumin is 1.5. Troponin less than 0.02. White cell count 28, hemoglobin 11.0, hematocrit 32.9, platelet count 362. The patient did have a chest x-ray done, which showed extensive density in the left hemithorax concerning for possible underlying pneumonia, hazy contralateral density in the right mid lung. Malignancy is not excluded. The patient did have a CT scan of the chest done, the results of which are still currently pending.   ASSESSMENT AND PLAN: This is a 64 year old male who is very unkempt and has not seen a doctor in many, many years, presented to the hospital due to shortness of breath, cough and productive sputum.  1.  Acute respiratory failure: This is likely secondary to chronic obstructive pulmonary disease exacerbation with underlying pneumonia. I will start treatment with intravenous steroids, around-the-clock nebulizer treatments. Will also start him on some Symbicort, Spiriva. Will give him empiric antibiotics with vancomycin and Zosyn. Follow sputum and blood cultures and follow him clinically. 2.  Pneumonia: This is likely the cause of the patient's respiratory failure along with chronic obstructive pulmonary disease exacerbation. Will treat with broad-spectrum iv2 antibiotics with vancomycin and Zosyn and follow him clinically. Follow blood and sputum cultures. The patient is currently afebrile and hemodynamically stable. 3.  Chronic obstructive pulmonary disease exacerbation: This is likely secondary to underlying pneumonia. I will treat with intravenous steroids, around-the-clock nebulizer treatments. Start him on Symbicort, Spiriva. I will also give him empiric antibiotics as mentioned above with vancomycin and Zosyn. Follow sputum and blood cultures. I will get a pulmonary consult given his significantly abnormal CT scan, the results of which are officially still pending. The patient will likely need to be addressed for  home oxygen prior to discharge.  4.  Acute renal failure: This is likely secondary to dehydration and systemic inflammatory response syndrome. I will give him intravenous fluids. Follow BUN and creatinine and urine output.  5.  Leukocytosis: This is likely secondary to the pneumonia. I will treat him with intravenous antibiotics and follow white cell count. 6.  Systemic inflammatory response syndrome: This is likely secondary to the pneumonia. The patient presented with hypoxia, tachycardia, leukocytosis secondary to the pneumonia. I will give him intravenous fluids, intravenous antibiotics, and follow his hemodynamics and follow fever curve and white cell count. Follow blood and sputum cultures as mentioned.   CODE STATUS: The patient is a full code.   CRITICAL CARE TIME SPENT: 50 minutes.    ____________________________ Belia Heman. Verdell Carmine, MD vjs:jm D: 03/21/2013 15:27:25 ET T: 03/21/2013 15:59:51 ET JOB#: [id number]  cc: Belia Heman. Verdell Carmine, MD, <Dictator> Henreitta Leber MD ELECTRONICALLY SIGNED 03/29/2013 12:36

## 2014-12-08 NOTE — Consult Note (Signed)
   Comments   Follow up visit made. Pt is now extubated. Awake, slightly lethargic but answers questions appropriately and follows simple commands. Will d/c comfort care status. Will discuss restarting meds with Dr Leslye Peer.   Electronic Signatures: Hidaya Daniel, Izora Gala (MD)  (Signed 15-Aug-14 13:49)  Authored: Palliative Care   Last Updated: 15-Aug-14 13:49 by Dedric Ethington, Izora Gala (MD)

## 2014-12-08 NOTE — Discharge Summary (Signed)
PATIENT NAME:  BAILEY, WALDECKER MR#:  [id number] DATE OF BIRTH:  1950-11-27  DATE OF ADMISSION:  03/21/2013 DATE OF DISCHARGE:  04/08/2013   ADDENDUM: Please refer to the patient's discharge summary done yesterday.   ADMITTING DIAGNOSIS: Shortness of breath.   DISCHARGE DIAGNOSES: 1.  Acute respiratory failure due to acute on chronic, chronic obstructive pulmonary disease exacerbation with pneumonia with acute systolic congestive heart failure.  2.  Acute on chronic, chronic obstructive pulmonary disease exacerbation.  3.  Previous history of emphysema.  4.  Septic shock secondary to pneumonia, now resolved.  5.  Acute renal failure due to acute tubular necrosis.  6.  Hypokalemia with premature ventricular contractions. 7.  Acute congestive heart failure with ejection fraction of 20% to 25%.   Please refer to discharge summary done yesterday for details. The patient was kept an additional day in the hospital because there was no availability of a skilled nursing facility bed yesterday. At this time, arrangements have been made for rehab. The patient is stable for discharge. Discharge instructions and everything has been completed.   TIME SPENT: 35 minutes spent on the discharge.   ____________________________ Lafonda Mosses. Posey Pronto, MD shp:jm D: 04/08/2013 11:19:51 ET T: 04/08/2013 13:16:58 ET JOB#: [id number]  cc: Missie Gehrig H. Posey Pronto, MD, <Dictator> Alric Seton MD ELECTRONICALLY SIGNED 04/12/2013 18:22

## 2014-12-08 NOTE — Consult Note (Signed)
   Comments   I met with pt's son, brother and sister. We discussed conflict among family members. All are in agreement that pt would not want trach/PEG and that he would not even want to be on ventilator. They are ready to proceed with extubation as soon as they have visited with pt. Chaplain present and helped facilitate discussion.   Electronic Signatures: Iyona Pehrson, Izora Gala (MD)  (Signed 15-Aug-14 10:59)  Authored: Palliative Care   Last Updated: 15-Aug-14 10:59 by Tyrian Peart, Izora Gala (MD)

## 2014-12-08 NOTE — Op Note (Signed)
PATIENT NAME:  Timothy Juarez, Timothy Juarez MR#:  [id number] DATE OF BIRTH:  1951/01/02  DATE OF PROCEDURE:  05/05/2013  PREOPERATIVE DIAGNOSES:  1.  Peripheral arterial disease with ulceration and gangrene, bilateral lower extremities.  2.  Status post right lower extremity revascularization.  3.  Chronic obstructive pulmonary disease.  4.  Congestive heart failure.   POSTOPERATIVE DIAGNOSES: 1.  Peripheral arterial disease with ulceration and gangrene, bilateral lower extremities.  2.  Status post right lower extremity revascularization.  3.  Chronic obstructive pulmonary disease.  4.  Congestive heart failure.   PROCEDURES:  1.  Ultrasound guidance for vascular access, right femoral artery.  2.  Catheter placement into left dorsalis pedis artery from right femoral approach.  3.  Aortogram with selective left lower extremity angiogram.  4.  Percutaneous transluminal angioplasty of left dorsalis pedis artery and distal anterior tibial artery with a 3 mm diameter angioplasty balloon.  5.  Percutaneous transluminal angioplasty of proximal anterior tibial artery with 3 mm diameter angioplasty balloon.  6.  Percutaneous transluminal angioplasty of left distal superficial femoral artery and proximal popliteal artery with 5 mm diameter angioplasty balloon.  7.  Self-expanding stent placement to the distal left superficial femoral artery and popliteal artery with 6 mm diameter self-expanding stent for greater than 50% residual stenosis and dissection after angioplasty.  8.  Percutaneous transluminal angioplasty of proximal left superficial femoral artery with 5 mm diameter angioplasty balloon.  9.  Self-expanding stent placement for greater than 50% residual stenosis and dissection after angioplasty of the proximal superficial femoral artery.  10. StarClose closure device, right femoral artery.   SURGEON: [name], M.D.   ANESTHESIA: Local with moderate conscious sedation.   ESTIMATED BLOOD LOSS:  Approximately 25 mL.  FLUOROSCOPY TIME: About 10 minutes.   INDICATION FOR PROCEDURE: This 64 year old male with severe ulcerations and gangrenous changes of both lower extremities. He has undergone right lower extremity revascularization previously and is brought back for evaluation of his left lower extremity with possible treatment. Risks and benefits were discussed. Informed consent was obtained.   DESCRIPTION OF PROCEDURE: The patient is brought to the vascular interventional radiology suite. Groins were shaved and prepped and a sterile surgical field was created. The right femoral artery was localized with fluoroscopy. Ultrasound was used to access a patent right femoral artery. This was done without difficulty with a Seldinger needle under direct ultrasound guidance and a permanent image was recorded. A 5-French sheath was placed. Pigtail catheter was used and an aortogram was performed. I then crossed the aortic bifurcation and advanced to the left femoral head. Selective left lower extremity angiogram was then performed. This showed an approximately 70% to 80% stenosis in the proximal superficial femoral artery. The distal superficial femoral artery at Hunter's canal had a near occlusive stenosis. The anterior tibial artery was then the only runoff to the foot and it had an area of 70% to 75% stenosis at the curvature of the anterior tibial artery proximally. The distal anterior tibial artery was occluded into the dorsalis pedis artery. The patient was systemically heparinized. A 6-French Ansell sheath was placed over a Terumo Advantage wire. A Terumo Advantage wire and Kumpe catheter were used to gain access to the tibial vessels, and then I exchanged for a 0.018 wire. This was then used to cross into the dorsalis pedis artery with a CXI catheter to confirm intraluminal flow in the foot.   Angioplasty was then performed with a 3 mm diameter angioplasty balloon in the  foot and the dorsalis pedis  artery up to the ankle to the distal anterior tibial artery. The balloon was then pulled back to the proximal anterior tibial artery and treated as well. There was still spasm, but improved flow in the distal area and in the dorsalis pedis artery. The proximal anterior tibial artery was significantly improved with less than 30% residual stenosis. These were separate and distinct lesions that were treated. I then turned my attention to the two separate distinct lesions in the superficial femoral artery with the lower lesion tracking in the popliteal artery. These lesions were separately treated with a 5 mm diameter angioplasty balloon with waist taken, which resolved with angioplasty. Both lesions after angioplasty had significant residual stenosis greater than 50% and dissection and I elected to re-exchange for a 0.035 wire and place stents in both locations separately. A 6 mm diameter by 10 cm in length was placed distally initially and a 6 mm diameter by 10 cm in length stent was placed proximally. These were then post dilated with a 5 mm balloon. Completion angiogram following stent placement showed both these areas to be widely patent without significant residual stenosis. At this point, I elected to terminate the procedure. The sheath was pulled back to the ipsilateral external iliac artery and oblique arteriogram was performed. StarClose closure device was deployed in the usual fashion with excellent hemostatic result. The patient tolerated the procedure well and was taken to the recovery room in stable condition.    ____________________________ Algernon Huxley, MD jsd:aw D: 05/05/2013 11:47:27 ET T: 05/05/2013 12:03:53 ET JOB#: [id number]  cc: Algernon Huxley, MD, <Dictator> Algernon Huxley MD ELECTRONICALLY SIGNED 05/11/2013 11:39

## 2014-12-09 NOTE — Op Note (Signed)
PATIENT NAME:  Timothy Juarez, BROSH MR#:  [id number] DATE OF BIRTH:  [birth year]  DATE OF PROCEDURE:  12/07/2013  PREOPERATIVE DIAGNOSIS: Peripheral arterial disease with gangrene right second toe, status post multiple lower extremity revascularizations.  POSTOPERATIVE DIAGNOSIS: Peripheral arterial disease with gangrene right second toe, status post multiple lower extremity revascularizations.  PROCEDURE: Right second toe ray amputation.   SURGEON: [name], M.D.    ANESTHESIA: General.   ESTIMATED BLOOD LOSS: Minimal.   INDICATION FOR PROCEDURE: This is a gentleman well-known to me for his peripheral vascular disease, ulcerations and gangrene. His right second toe is very painful. It has gangrene at the tip and skin breakdown in the mid toe, now this is painful. He has non-salvageable tissue and amputation will be performed. Risks and benefits were discussed. Informed consent was obtained.   DESCRIPTION OF PROCEDURE: The patient is brought to the operative suite and after an adequate level of general anesthesia was obtained, the right foot was sterilely prepped and draped and a sterile surgical field was created. A fishmouth incision was created near the base of the right second toe. We dissected down removing the toe without difficulty with electrocautery and blunt dissection. The metatarsal head was then removed with rongeurs. The wound was then irrigated. It was closed with 3 interrupted 3-0 Vicryl sutures to close the deep space. The skin was closed with interrupted mattress nylon sutures. Sterile dressings were placed. The patient tolerated the procedure well and was taken to the recovery room in stable condition.   ____________________________ Algernon Huxley, MD jsd:aw D: 12/07/2013 08:36:50 ET T: 12/07/2013 08:49:33 ET JOB#: [id number]  cc: Algernon Huxley, MD, <Dictator> Algernon Huxley MD ELECTRONICALLY SIGNED 12/12/2013 9:36

## 2014-12-09 NOTE — Op Note (Signed)
PATIENT NAME:  Timothy Juarez, Timothy Juarez MR#:  [id number] DATE OF BIRTH:  12-22-1950  DATE OF PROCEDURE:  12/05/2013  PREOPERATIVE DIAGNOSES:  1. Peripheral arterial disease with ulceration and gangrenous changes to right foot.  2. Status post multiple previous interventions for peripheral arterial disease, bilateral lower extremities.   POSTOPERATIVE DIAGNOSES:  1. Peripheral arterial disease with ulceration and gangrenous changes to right foot.  2. Status post multiple previous interventions for peripheral arterial disease, bilateral lower extremities.   PROCEDURES:  1. Ultrasound guidance for vascular access, left femoral artery.  2. Catheter placement in the right anterior tibial artery from a left femoral approach.  3. Aortogram and selective right lower extremity angiogram.  4. Percutaneous transluminal angioplasty of the dorsalis pedis artery with 2 mm diameter angioplasty balloon.  5. Percutaneous transluminal angioplasty of right anterior tibial artery with 3 mm diameter angioplasty balloon.  6. Percutaneous transluminal angioplasty with drug-coated angioplasty balloon to the proximal anterior tibial artery and distal popliteal artery with 4 mm diameter angioplasty balloon.  7. Percutaneous transluminal angioplasty of right popliteal artery and distal SFA with 5 mm diameter angioplasty balloon.  8. Percutaneous transluminal angioplasty for separate and distinct lesion in the proximal SFA and previously placed stent with a 6 mm diameter angioplasty balloon.  9. Self-expanding stent placement to the right distal SFA and popliteal artery for greater than 50% residual stenosis and dissection after angioplasty.  10. Self-expanding stent placement to the right SFA for greater than 50% residual stenosis and dissection after angioplasty.  11. StarClose closure device, left femoral artery.   SURGEON: Algernon Huxley, MD   ANESTHESIA: Local with moderate conscious sedation.   BLOOD LOSS:  25  mL.  INDICATION FOR PROCEDURE: This is a 64 year old gentleman with severe peripheral vascular disease.  He has had multiple previous interventions. He has had some improvement in his feet; however, he has worsening ulceration of his right heel and still some gangrenous changes on the tip of his toe as well as a persistent ulcer in the metatarsal head and toe area. At this point, noninvasive study showed recurrent significant arterial insufficiency of the right lower extremity and is brought back for an attempt at revascularization for limb salvage. The risks and benefits were discussed. Informed consent was obtained.   DESCRIPTION OF THE PROCEDURE: The patient is brought to the vascular suite. Groins were shaved and prepped and a sterile surgical field was created. The left femoral artery was visualized with ultrasound and found to be widely patent, was then accessed under ultrasound guidance without difficulty with a Seldinger needle. A J-wire and a 5-French sheath were then placed.  Pigtail catheter was placed at the aorta at the L1-L2 level and AP aortogram was performed. This showed patent renal arteries bilaterally with no flow-limiting stenosis in the aorta within the aorta or iliac segments. I then crossed the aortic bifurcation and advanced to the right femoral head and selective right lower extremity angiogram was then performed. This showed severe disease with recurrent stenosis within the proximal SFA.  The stent in the mid SFA had a near occlusive stenosis in its proximal portion. There was then about an 8 cm-10 cm segment in the distal SFA that was patent. The popliteal artery then had occlusion above the knee, reconstituted around the knee, and then had disease below the knee with stenoses and potential occlusion, although it was difficult to discern due to the slow flow. The anterior tibial was the only runoff distally and it had a  stenosis proximally and an occlusion distally. At this point, I  tediously began working on getting across the occlusions, and with a Kumpe catheter and CXI catheter and an advantage wire and a V18 wire, I was able to gain access and confirm intraluminal flow in the dorsalis pedis artery, which was diseased.  I replaced the 0.018 wire and treated the dorsalis pedis artery with a 2 mm diameter angioplasty balloon. The anterior tibial artery from the ankle up to the origin was treated with a 3 mm diameter angioplasty balloon.  This  seemed to improve the flow but was still somewhat limited proximally. I used a 4 mm diameter Lutonix angioplasty balloon to treat the proximal anterior tibial artery and the below-knee popliteal artery.  A 5 mm diameter Lutonix angioplasty balloon was then used to treat the distal SFA and above-knee popliteal artery for the occlusion that was seen on the original imaging and a 6 mm diameter Lutonix angioplasty balloon was used to treat the recurrent in-stent stenosis and then the proximal SFA stenosis. Following this, there was still severe multilevel stenoses and occlusion. The anterior tibial artery had better flow.  I had to take a 6 mm diameter by 20 cm length self-expanding stent from the proximal SFA to the distal SFA. I then used a 6 mm diameter by a 20 cm length self-expanding stent from the distal SFA to the popliteal artery just below the knee. The separate areas of original stenoses and occlusion, neither responded particularly well to angioplasty. Following stent placement, these were post dilated with a 5 mm balloon distally and a 6 mm balloon proximally with a reasonably good angiographic completion result. At this point, I elected to terminate the procedure. The patient tolerated the procedure well and was taken to the recovery room in stable condition.    ____________________________ Algernon Huxley, MD jsd:dd D: 12/05/2013 16:30:26 ET T: 12/05/2013 20:09:16 ET JOB#: [id number]  cc: Algernon Huxley, MD, <Dictator> Algernon Huxley  MD ELECTRONICALLY SIGNED 12/12/2013 9:36

## 2014-12-09 NOTE — Op Note (Signed)
PATIENT NAME:  Timothy Juarez, Timothy Juarez MR#:  [id number] DATE OF BIRTH:  Feb 20, 1951  DATE OF PROCEDURE:  03/16/2014  PREOPERATIVE DIAGNOSES: 1.  Peripheral arterial disease with ulceration bilateral lower extremities.  2.  History of gangrenous changes and multiple previous revascularizations.   POSTOPERATIVE DIAGNOSES:  1.  Peripheral arterial disease with ulceration bilateral lower extremities.  2.  History of gangrenous changes and multiple previous revascularizations.   PROCEDURES PERFORMED: 1.  Ultrasound guidance for vascular access, right femoral artery.  2.  Catheter placement to left peroneal artery from right femoral approach.  3.  Aortogram and selective left lower extremity angiogram.  4.  Percutaneous transluminal angioplasty of peroneal artery with 2 and 3 mm diameter angioplasty balloon.  5.  percutaneous transluminal angioplasty of tibioperoneal trunk with 2 and 3 mm diameter angioplasty balloon.  6.  Percutaneous transluminal angioplasty of above-knee popliteal artery with 5 mm diameter Lutonix drug-coated angioplasty balloon.  7.  Percutaneous transluminal angioplasty of proximal superficial femoral artery with 6 mm diameter Lutonix drug-coated angioplasty balloon. 8.  StarClose closure device right femoral artery.   SURGEON: Algernon Huxley, MD  ANESTHESIA: Local with moderate conscious sedation.   ESTIMATED BLOOD LOSS: About 25 mL.   INDICATION FOR PROCEDURE: This is a gentleman well-known to [place] for his multiple previous revascularizations, severe peripheral vascular disease and bilateral nonhealing ulcerations. He is brought back today for improvement of his left lower extremity perfusion due to noninvasive studies showing reduced flow with persistent ulcerations. Risks and benefits were discussed. Informed consent was obtained.   DESCRIPTION OF PROCEDURE: The patient was brought to the vascular suite. His groins were shaved and prepped and a sterile surgical field was  created. The right femoral head was localized with fluoroscopy. The right femoral artery was visualized on ultrasound and found to be widely patent. It was then accessed under direct ultrasound guidance without difficulty with a Seldinger needle. A J-wire and 5-French sheath were placed. Pigtail catheter was placed in the aorta at the L1 level and AP aortogram was performed. This demonstrated normal flow within the aorta and iliac segments and patent renal arteries bilaterally. I then crossed the aortic bifurcation, advanced to the left femoral head and selective left lower extremity angiogram was then performed. This demonstrated about a 60 to 65% stenosis over about a 5 to 6 cm area, in the proximal SFA, prior to the previously placed stent and just at the top of the previously placed stent. Just below another stent, in the distal SFA and above-knee popliteal artery, was about a 75% to 80% stenosis and this tapered over about a 5 to 6 cm segment. The tibials were then re-occluded with the peroneal artery having a target distally. I then placed a 6-French Ansell sheath over a Terumo Advantage wire and crossed the SFA and popliteal occlusions. After switching out for an 0.018 Advantage Wire and a CXI catheter, I was able to cross the peroneal occlusion and confirm intraluminal flow at the ankle. I then replaced the 0.018 wire. A 2 mm diameter angioplasty balloon was initially inflated, and then we upsized to a 3 mm diameter angioplasty balloon for the majority of the peroneal artery and tibioperoneal trunk to treat the occlusion. Following angioplasty this was markedly improved. I treated the popliteal lesion with a 5 mm diameter angioplasty balloon and the SFA lesion that was separate and distinct with 6 mm diameter angioplasty balloon. Following this both were markedly improved with less than 20% residual stenosis. At this point,  I elected to terminate the procedure. The sheath was pulled back to the ipsilateral  external iliac artery and oblique arteriogram was performed. StarClose closure device was deployed in the usual fashion with excellent hemostatic result. The patient tolerated the procedure well and was taken to the recovery room in stable condition.  ____________________________ Algernon Huxley, MD jsd:sb D: 03/16/2014 15:31:31 ET T: 03/16/2014 15:46:11 ET JOB#: [id number]  cc: Algernon Huxley, MD, <Dictator>

## 2014-12-09 NOTE — Op Note (Signed)
PATIENT NAME:  Timothy Juarez, Timothy Juarez MR#:  [id number] DATE OF BIRTH:  [birth year]  DATE OF PROCEDURE:  08/22/2013  PREOPERATIVE DIAGNOSES: 1.  Peripheral arterial disease with gangrene, bilateral lower extremities.  2.  Status post previous revascularization, bilateral lower extremities.  3.  Congestive heart failure.  4.  Chronic obstructive pulmonary disease.   POSTOPERATIVE DIAGNOSES:    1.  Peripheral arterial disease with gangrene, bilateral lower extremities.  2.  Status post previous revascularization, bilateral lower extremities.  3.  Congestive heart failure.  4.  Chronic obstructive pulmonary disease.   PROCEDURES: 1.  Ultrasound guidance for vascular access to right femoral artery.  2.  Catheter placement into left anterior tibial artery in the foot from right femoral approach.  3.  Aortogram and selective left lower extremity angiogram.  4.  Percutaneous transluminal angioplasty of 3 mm diameter angioplasty balloon to distal anterior tibial artery.  5.  Percutaneous transluminal angioplasty for separate and distinct lesion of the proximal anterior tibial artery with 3 mm diameter angioplasty balloon, and then with 4 mm diameter drug-coated balloon.  6.  Percutaneous transluminal angioplasty of distal superficial femoral artery for in-stent stenosis with 6 mm diameter drug-coated balloon.  7.  Percutaneous transluminal angioplasty of proximal superficial femoral artery for separate and distinct lesion and recurrent in-stent stenosis with 6 mm diameter drug-coated angioplasty balloon.  8.  StarClose closure device, right femoral artery.   SURGEON: Algernon Huxley, M.D.   ANESTHESIA: Local with moderate conscious sedation.   ESTIMATED BLOOD LOSS: Minimal.   CONTRAST USED: 90 mL Visipaque.   INDICATION FOR PROCEDURE: A 64 year old African American male with severe peripheral vascular disease. He has undergone multiple interventions. He has gangrene of both feet that we are trying to  salvage as much tissue as possible. He initially had a good response to his left lower extremity revascularization last year but had a drop in his ABI on his most recent study with no improvement in his wounds over several weeks. He is brought in for angiography for further evaluation and potential treatment. Risks and benefits were discussed. Informed consent was obtained.   DESCRIPTION OF PROCEDURE: The patient is brought to the vascular radiology suite. The patient's groins were sterilely prepped and draped and a sterile surgical field was created. The right femoral artery was visualized with ultrasound found to be widely patent. It was then accessed under direct ultrasound guidance without difficulty with a Seldinger needle. A J wire and 5-French sheath were then placed. Pigtail catheter was placed in the aorta at the L1-L2 level and AP aortogram was performed. This showed patent renal arteries and patent aortoiliac segments without flow-limiting stenosis. I then crossed the aortic bifurcation with the pigtail catheter and advanced to the left femoral head. Selective left lower extremity angiogram was then performed. This showed significant recurrent stenosis in multiple locations. His proximal SFA stent had about a 75% to 80% recurrent stenosis and some diffuse intimal hyperplasia. The distal SFA stent had 60% to 65% stenosis through diffuse intimal hyperplasia with an area of about a 70% stenosis at the proximal leading edge of the stent. The anterior tibial artery had a short segment of occlusion proximally. It was then a reasonably robust artery through collaterals in the mid segment into the distal anterior tibial artery where it again occluded before  reconstituting in the foot as another separate and distinct occlusive lesion. The patient was systemically heparinized. A 6-French Ansel sheath was placed over a Terumo Advantage wire, [employer] Advantage  wire, and initially a Kumpe catheter, were used to  navigate into the anterior tibial artery. The SFA stenoses and the proximal anterior tibial occlusion were navigated without difficulty, and then I exchanged for a CXI catheter and 0.018 wire. I was able to cross the distal AT occlusion and confirm intraluminal flow into the foot, and then replace the 0.018 wire. A 3 mm diameter angioplasty balloon was inflated in the distal anterior tibial artery and dorsalis pedis artery with good angiographic completion result after imaging. The proximal anterior tibial artery was also treated with a 3 mm diameter conventional angioplasty balloon with a suboptimal result and a greater than 50% residual stenosis, and so I elected to treat this area with a 4 mm diameter Lutonix drug-coated balloon. Following this, there was no greater than 20% residual stenosis and significantly improved flow. I then treated the distal SFA stent with a 6 mm diameter Lutonix drug-coated angioplasty balloon for the recurrent stenosis within the stent with a good angiographic completion result and about 30% residual stenosis. The separate and distinct proximal superficial femoral artery stent was then treated also with a Lutonix angioplasty balloon, 6 mm in diameter. There was about a 40% residual stenosis after angioplasty there.  At this point, no lesion appeared to be flow limiting. I felt our backup option would be a very long covered stent in the SFA and was not prepared to place this today and knock off many collaterals, and so I elected to leave the non-flow-limiting stenoses within the SFA stents. Should he have recurrent stenosis, I would consider placement at that time of a long covered stent. At this point, the sheath was pulled back to the ipsilateral external iliac artery and oblique arteriogram was performed. StarClose closure device was deployed in the usual fashion with excellent hemostatic result. The patient tolerated the procedure well and was taken to the recovery room in stable  condition.     ____________________________ Algernon Huxley, MD jsd:dmm D: 08/22/2013 11:21:34 ET T: 08/22/2013 12:25:51 ET JOB#: [id number]  cc: Algernon Huxley, MD, <Dictator> Melvyn Novas. Daneen Schick, MD Algernon Huxley MD ELECTRONICALLY SIGNED 08/25/2013 13:36

## 2014-12-13 NOTE — Op Note (Signed)
PATIENT NAME:  Timothy Juarez, Timothy Juarez MR#:  [id number] DATE OF BIRTH:  21-May-1951  DATE OF PROCEDURE:  08/14/2014  PREOPERATIVE DIAGNOSES:  1.  Peripheral arterial disease with ulceration bilateral lower extremities.  2.  Status post multiple revascularizations bilateral lower extremities.  3.  Hypertension.  4.  Chronic obstructive pulmonary disease.     POSTOPERATIVE DIAGNOSES:  1.  Peripheral arterial disease with ulceration bilateral lower extremities.  2.  Status post multiple revascularizations bilateral lower extremities.  3.  Hypertension.  4.  Chronic obstructive pulmonary disease.     PROCEDURES:   1.  Ultrasound guidance for vascular access to right femoral artery.  2.  Catheter placement into left dorsalis pedis artery from right femoral approach.  3.  Aortogram and selective left lower extremity angiogram.  4.  Percutaneous transluminal angioplasty of dorsalis pedis artery and entire anterior tibial artery with 3 mm diameter angioplasty balloon.  5.  Percutaneous transluminal angioplasty of distal popliteal artery and proximal anterior tibial artery with 3 mm diameter conventional and 4 mm diameter drug-coated angioplasty balloon.  6.  Percutaneous transluminal angioplasty of popliteal artery with 5 mm diameter drug-coated angioplasty balloon.  7.  Percutaneous transluminal angioplasty of distal superficial femoral artery and above-knee popliteal artery with 5 mm diameter drug-coated and 6 mm diameter conventional angioplasty balloon.  8.  Percutaneous transluminal angioplasty of proximal superficial femoral artery with 6 mm diameter drug-coated angioplasty balloon  9.  StarClose closure device right femoral artery.   SURGEON: Algernon Huxley, MD.   ANESTHESIA: Local with moderate conscious sedation.    ESTIMATED BLOOD LOSS: Approximately 25 mL.    CONTRAST:  95 mL of contrast used.     FLUOROSCOPY TIME: Approximately 15 minutes.   INDICATION FOR PROCEDURE: This is a  64 year old gentleman with severe peripheral vascular disease. He has undergone multiple previous revascularizations of both lower extremities for attempt at wound healing.  His wounds have worsened and his noninvasive study showed significant areas of disease in both lower extremities with more focal stenoses identified on the left. He is brought in for angiography for further evaluation and potential treatment. Risks and benefits were discussed and informed consent was obtained.   DESCRIPTION OF PROCEDURE: The patient was brought to the vascular suite. Groins were shaved and prepped and a sterile surgical field was created. The right femoral artery was localized with fluoroscopy. The right femoral artery was visualized with ultrasound and found to be patent. It was then accessed under direct ultrasound guidance without difficulty with a Seldinger needle. A J-wire and 5 French sheath were then placed. Pigtail catheter was placed in the aorta at the L1-L2 level and AP aortogram was performed that showed normal renal artery flow bilaterally, normal aorta and iliac segments. I then crossed the aortic bifurcation and advanced to the left femoral head and selective left lower extremity angiogram was then performed. This showed multiple areas of stenoses, there was a proximal SFA stenosis that was about 70-75% above the previously placed stent only about 1-2 cm in the SFA. The stent had some intimal hyperplasia, there was then an area between stents which showed no significant stenosis. The distal SFA stent had another area of 75-80% stenosis in the distal edge of the stent down into the above-knee popliteal artery. The popliteal artery then normalized for a few centimeters with separate distinct stenoses near occlusive in the popliteal artery below the knee were seen. This then occluded and the distal popliteal artery reconstituted the anterior tibial artery  in the midsegment, it then occluded in the distal segment  and reconstituted in the dorsalis pedis pulse in the foot. There were multiple areas of stenoses and to try to improve his perfusion for wound healing we attempted to treat these. I used a 6 Restaurant manager, fast food and a Terumo Advantage wire to get our platform. He was given 4000 units of intravenous heparin. I used a 100 cm Kumpe catheter, then exchanged for a 11 CXI catheter when we were more distal. The proximal SFA stenosis, distal SFA, and above-knee popliteal stenoses were easily traversed. The separate distinct stenosis and then occlusion in the popliteal artery was a little more difficult, but  I was able to gain access into the anterior tibial artery, which was the best target distally. This stenoses and then occlusion were entered, the anterior tibial artery we gained access to the midsegment where it reconstituted, then it in a separate and distinct area had an occlusion in the distal anterior tibial artery. We were able to get a CXI catheter and an 0.018 wire into the foot in the dorsalis pedis artery and confirm intraluminal flow. I then replaced the 0.018 wire. A 3 mm diameter angioplasty balloon was inflated from the dorsalis pedis artery up into the distal popliteal artery. The mid and distal anterior tibial artery occlusion was well treated with this and now appeared patent. The proximal anterior tibial artery had a high-grade residual stenosis and the 3 mm vessel was not large enough for the distal popliteal artery. I used a 4 mm diameter Lutonix drug-coated angioplasty balloon in the proximal anterior tibial artery and distal popliteal artery with an improved result although there was about a 40% residual stenosis, I did not think placing a stent in this location was optimal. For the remainder of the popliteal artery below the knee and up to just above the knee I used a 5 mm diameter Lutonix drug-coated angioplasty balloon with a good angiographic result and about a 20% residual stenosis.  For the  separate and distinct lesion within the distal SFA and above-knee popliteal artery I used a 5 mm diameter Lutonix drug-coated angioplasty balloon and then a 6 mm diameter conventional angioplasty balloon, again with a good result. The distal SFA stent had some diffuse intimal hyperplasia and I treated the entirety of the stent as well with a good angiographic completion result and less than 30% residual stenosis. I then turned my attention to the separate distinct lesion in the proximal SFA. This was well removed from the other lesions. A 6 mm diameter Lutonix drug-coated angioplasty balloon was inflated in the proximal SFA going into the initial portions of the previously placed stent in the proximal SFA. Narrowing was seen with angioplasty which resolved at about 8-10 atmospheres with the balloon and inflation was held for 1 minute. Completion angiogram showed this area to be widely patent without significant residual stenosis. All in all we had treated 4 separate and distinct lesions and I had improved his perfusion significantly. At this point I elected to terminate the procedure. The sheath was pulled back to the ipsilateral external iliac artery and completion arteriogram was performed. StarClose closure device was deployed in the usual fashion with excellent hemostatic result. The patient tolerated the procedure well and was taken to the recovery room in stable condition.   ____________________________ Algernon Huxley, MD jsd:bu D: 08/14/2014 15:46:39 ET T: 08/14/2014 19:44:20 ET JOB#: [id number]  cc: Algernon Huxley, MD, <Dictator> Algernon Huxley MD ELECTRONICALLY SIGNED  08/21/2014 14:13 

## 2014-12-17 NOTE — Op Note (Signed)
PATIENT NAME:  Timothy Juarez, Timothy Juarez MR#:  [id number] DATE OF BIRTH:  [birth year]  DATE OF PROCEDURE:  09/25/2014  PREOPERATIVE DIAGNOSES:  1.  Peripheral arterial disease with ulceration bilateral lower extremities.  2.  Status post multiple previous interventions.  3.  Bilateral lower extremity ulcerations.    POSTOPERATIVE DIAGNOSIS: 1.  Peripheral arterial disease with ulceration bilateral lower extremities.  2.  Status post multiple previous interventions.  3.  Bilateral lower extremity ulcerations.    PROCEDURES:  1.  Ultrasound guidance for vascular access to left femoral artery.  2.  Catheter placement to right anterior tibial artery from left femoral approach.  3.  Right lower extremity angiogram.  4.  Percutaneous transluminal angioplasty of proximal anterior tibial artery and distal popliteal artery with 4 mm diameter drug-coated Lutonix angioplasty balloon.  5.  Percutaneous transluminal angioplasty of above-knee popliteal artery for separate distinct lesion in recurrent stenosis within the stent with 6 mm diameter Lutonix drug-coated angioplasty balloon.  6.  Percutaneous transluminal angioplasty of proximal SFA and common femoral artery with a 6 mm diameter Lutonix drug-coated angioplasty balloon.  7.  Percutaneous transluminal angioplasty of right common femoral artery with an 8 mm diameter conventional angioplasty balloon after residual stenosis after 6 mm balloon.  8.  StarClose closure device, left femoral artery.   SURGEON: Algernon Huxley, M.D.   ANESTHESIA: Local with moderate conscious sedation.   ESTIMATED BLOOD LOSS: Minimal.   INDICATION FOR PROCEDURE: A 64 year old gentleman with extensive severe peripheral vascular disease. He has undergone multiple previous revascularization procedures. His left lower extremity was revascularized towards the end of last year with good results and improvement in his wounds. He still has residual ulcerations of the right foot that are  not healing and a significantly reduced ABI. He is brought in for angiography for further evaluation and potential treatment. Risks, benefits were discussed. Informed consent was obtained.   DESCRIPTION OF PROCEDURE: The patient is brought to the vascular suite. Groins were shaved and prepped, and a sterile surgical field was created. The left femoral artery was visualized with ultrasound and accessed under direct ultrasound guidance without difficulty with a Seldinger needle. J-wire and 5 French sheath were then placed as we had already performed an aortogram within the past three months,  I went ahead and crossed the aortic bifurcation _____. There was not proximal stenosis in the aorta proximal iliac segments.  This was advanced to the right femoral head and selective right lower extremity angiogram was then performed. This demonstrated about a 70% stenosis in the common femoral artery. The proximal SFA also had about a 65% to 70% stenosis at its origin and then about a 75% to 80% stenosis in a few centimeters beyond this. There was then stents in the SFA that were patent throughout much of the SFA at [facility] canal, in the above-knee popliteal artery there was recurrent stenosis within the stent that was in the 75% to 80% range. The popliteal artery then normalized at the distal end of the stent just below the knee was patent, but then the distal popliteal artery and the anterior tibial artery had significant stenosis of the first 5 to 6 cm in the anterior tibial artery. This vessel occluded distally, but was well collateralized at the ankle and was really the only runoff distally. I heparinized the patient and placed a 6 [place] high Flex Ansel sheath over a [place] advantage wire across the common femoral proximal SFA, popliteal lesions and using a Kumpe catheter, navigated  the wire and catheter into the anterior tibial artery beyond the stenosis. I then replaced the wire through the generous size of the  proximal anterior tibial artery. I used a 4 mm diameter Lutonix drug-coated angioplasty balloon to treat the anterior tibial artery proximally with the balloon taken up to the distal popliteal artery to cover the entire stenosis. Narrowing was seen, which resolved with angioplasty and good angiographic result was less than 25% residual stenosis was seen after inflation. I then turned my attention to the separate distinct lesion in the above-knee popliteal artery at [facility] canal.  A 6 mm diameter x 15 cm in length Lutonix drug-coated angioplasty balloon was used to treat this lesion. Again, the balloon was inflated to about 10 atmospheres with resolution of the narrowing. Once the balloon was deflated imaging was performed, which showed about a 20% residual stenosis that was all that remained was markedly improved flow. I then turned my attention to the separate distinct lesion in the common femoral artery and proximal superficial femoral artery. This was treated with a 6 mm diameter Lutonix drug-coated angioplasty balloon as well. This was proximal to the previously placed stent in the SFA and up to the common femoral artery. After imaging here there was still residual stenosis in the common femoral artery of greater than 50% and I upsized to an 8 mm diameter angioplasty balloon with a significantly improved result and about a 30% to 35% residual stenosis in the common femoral artery that did not appear flow limiting. The proximal SFA had about a 20% residual stenosis.  At this point I terminated the procedure. The sheath was pulled back to the ipsilateral external iliac artery and oblique arteriogram was performed. StarClose closure device _____ usual fashion with excellent hemostatic result. The patient tolerated the procedure well and was taken to the recovery room in stable condition.     ____________________________ Algernon Huxley, MD jsd:at D: 09/25/2014 10:22:44 ET T: 09/25/2014 10:44:09  ET JOB#: [id number]  cc: Algernon Huxley, MD, <Dictator> Algernon Huxley MD ELECTRONICALLY SIGNED 09/27/2014 14:06

## 2015-01-18 ENCOUNTER — Ambulatory Visit
Admission: RE | Admit: 2015-01-18 | Discharge: 2015-01-18 | Disposition: A | Discharge status: Home / Self Care | Payer: Medicaid Other | Source: Ambulatory Visit | Attending: Vascular Surgery | Admitting: Vascular Surgery

## 2015-01-18 ENCOUNTER — Encounter: Payer: Self-pay | Admitting: *Deleted

## 2015-01-18 ENCOUNTER — Encounter
Admission: RE | Disposition: A | Discharge status: Home / Self Care | Payer: Self-pay | Source: Ambulatory Visit | Attending: Vascular Surgery

## 2015-01-18 DIAGNOSIS — I70249 Atherosclerosis of native arteries of left leg with ulceration of unspecified site: Secondary | ICD-10-CM | POA: Insufficient documentation

## 2015-01-18 DIAGNOSIS — I10 Essential (primary) hypertension: Secondary | ICD-10-CM | POA: Insufficient documentation

## 2015-01-18 DIAGNOSIS — L97509 Non-pressure chronic ulcer of other part of unspecified foot with unspecified severity: Secondary | ICD-10-CM | POA: Diagnosis not present

## 2015-01-18 DIAGNOSIS — J449 Chronic obstructive pulmonary disease, unspecified: Secondary | ICD-10-CM | POA: Insufficient documentation

## 2015-01-18 DIAGNOSIS — L97409 Non-pressure chronic ulcer of unspecified heel and midfoot with unspecified severity: Secondary | ICD-10-CM | POA: Diagnosis not present

## 2015-01-18 DIAGNOSIS — Z7902 Long term (current) use of antithrombotics/antiplatelets: Secondary | ICD-10-CM | POA: Diagnosis not present

## 2015-01-18 DIAGNOSIS — L98499 Non-pressure chronic ulcer of skin of other sites with unspecified severity: Secondary | ICD-10-CM

## 2015-01-18 DIAGNOSIS — Z79899 Other long term (current) drug therapy: Secondary | ICD-10-CM | POA: Insufficient documentation

## 2015-01-18 DIAGNOSIS — I509 Heart failure, unspecified: Secondary | ICD-10-CM | POA: Diagnosis not present

## 2015-01-18 DIAGNOSIS — I70209 Unspecified atherosclerosis of native arteries of extremities, unspecified extremity: Secondary | ICD-10-CM

## 2015-01-18 HISTORY — DX: Chronic obstructive pulmonary disease, unspecified: J44.9

## 2015-01-18 HISTORY — DX: Essential (primary) hypertension: I10

## 2015-01-18 HISTORY — DX: Peripheral vascular disease, unspecified: I73.9

## 2015-01-18 HISTORY — PX: PERIPHERAL VASCULAR CATHETERIZATION: SHX172C

## 2015-01-18 HISTORY — DX: Heart failure, unspecified: I50.9

## 2015-01-18 LAB — BUN: BUN: 19 mg/dL (ref 6–20)

## 2015-01-18 LAB — CREATININE, SERUM
Creatinine, Ser: 1.31 mg/dL — ABNORMAL HIGH (ref 0.61–1.24)
GFR calc non Af Amer: 56 mL/min — ABNORMAL LOW (ref >60)

## 2015-01-18 SURGERY — LOWER EXTREMITY ANGIOGRAPHY
Anesthesia: Moderate Sedation

## 2015-01-18 MED ORDER — HEPARIN SODIUM (PORCINE) 1000 UNIT/ML IJ SOLN
INTRAMUSCULAR | Status: DC | PRN
  Administered 2015-01-18: 4000 [IU] via INTRAVENOUS

## 2015-01-18 MED ORDER — IOPAMIDOL (ISOVUE-300) INJECTION 61%
INTRAVENOUS | Status: DC | PRN
  Administered 2015-01-18: 85 mL

## 2015-01-18 MED ORDER — HEPARIN SODIUM (PORCINE) 1000 UNIT/ML IJ SOLN
INTRAMUSCULAR | Status: AC
  Filled 2015-01-18: qty 1

## 2015-01-18 MED ORDER — MIDAZOLAM HCL 2 MG/2ML IJ SOLN
INTRAMUSCULAR | Status: AC
  Filled 2015-01-18: qty 2

## 2015-01-18 MED ORDER — FENTANYL CITRATE (PF) 100 MCG/2ML IJ SOLN
INTRAMUSCULAR | Status: AC
  Filled 2015-01-18: qty 2

## 2015-01-18 MED ORDER — ONDANSETRON HCL 4 MG/2ML IJ SOLN: 4.0000 mg | INTRAMUSCULAR | Status: DC | PRN

## 2015-01-18 MED ORDER — SODIUM CHLORIDE 0.9 % IV SOLN
INTRAVENOUS | Status: DC
  Administered 2015-01-18: 09:00:00 via INTRAVENOUS

## 2015-01-18 MED ORDER — MIDAZOLAM HCL 5 MG/5ML IJ SOLN
INTRAMUSCULAR | Status: AC
  Filled 2015-01-18: qty 5

## 2015-01-18 MED ORDER — LIDOCAINE-EPINEPHRINE (PF) 1 %-1:200000 IJ SOLN
INTRAMUSCULAR | Status: AC
Start: 2015-01-18 — End: 2015-01-18
  Filled 2015-01-18: qty 30

## 2015-01-18 MED ORDER — HYDROMORPHONE HCL 1 MG/ML IJ SOLN: 1.0000 mg | Freq: Once | INTRAMUSCULAR | Status: AC | PRN

## 2015-01-18 MED ORDER — FENTANYL CITRATE (PF) 100 MCG/2ML IJ SOLN
INTRAMUSCULAR | Status: DC | PRN
  Administered 2015-01-18 (×7): 50 ug via INTRAVENOUS

## 2015-01-18 MED ORDER — MIDAZOLAM HCL 2 MG/2ML IJ SOLN
INTRAMUSCULAR | Status: DC | PRN
  Administered 2015-01-18 (×5): 1 mg via INTRAVENOUS
  Administered 2015-01-18: 2 mg via INTRAVENOUS
  Administered 2015-01-18: 1 mg via INTRAVENOUS

## 2015-01-18 MED ORDER — CEFAZOLIN SODIUM 1-5 GM-% IV SOLN
INTRAVENOUS | Status: AC
Start: 2015-01-18 — End: 2015-01-18
  Filled 2015-01-18: qty 50

## 2015-01-18 MED ORDER — ATROPINE SULFATE 0.1 MG/ML IJ SOLN: 0.5000 mg | INTRAMUSCULAR | Status: DC | PRN

## 2015-01-18 MED ORDER — CEFAZOLIN SODIUM 1-5 GM-% IV SOLN
1.0000 g | Freq: Once | INTRAVENOUS | Status: AC
  Administered 2015-01-18: 1 g via INTRAVENOUS

## 2015-01-18 MED ORDER — HEPARIN (PORCINE) IN NACL 2-0.9 UNIT/ML-% IJ SOLN
INTRAMUSCULAR | Status: AC
  Filled 2015-01-18: qty 1000

## 2015-01-18 SURGICAL SUPPLY — 25 items
BALLN LUTONIX 4X150X130 (BALLOONS) ×4
BALLN ULTRVRSE 2.5X300X150 (BALLOONS) ×4
BALLN ULTRVRSE 4X100X150 (BALLOONS) ×4
BALLN ULTRVRSE 6X220X130 (BALLOONS) ×4
BALLOON LUTONIX 4X150X130 (BALLOONS) ×2 IMPLANT
BALLOON ULTRVRSE 2.5X300X150 (BALLOONS) ×2 IMPLANT
BALLOON ULTRVRSE 4X100X150 (BALLOONS) ×2 IMPLANT
BALLOON ULTRVRSE 6X220X130 (BALLOONS) ×2 IMPLANT
CATH CXI SUPP ANG 4FR 135 (MICROCATHETER) ×2 IMPLANT
CATH CXI SUPP ANG 4FR 135CM (MICROCATHETER) ×4
CATH KMP 5.0X100 (CATHETERS) ×4 IMPLANT
CATH ROYAL FLUSH PIG 5F 70CM (CATHETERS) ×4 IMPLANT
DEVICE PRESTO INFLATION (MISCELLANEOUS) ×4 IMPLANT
DEVICE STARCLOSE SE CLOSURE (Vascular Products) ×2 IMPLANT
GLIDEWIRE ADV .035X260CM (WIRE) ×4 IMPLANT
GUIDEWIRE PFTE-COATED .018X300 (WIRE) ×4 IMPLANT
PACK ANGIOGRAPHY (CUSTOM PROCEDURE TRAY) ×4 IMPLANT
SHEATH ANL2 6FRX45 HC (SHEATH) ×4 IMPLANT
SHEATH BRITE TIP 5FRX11 (SHEATH) ×4 IMPLANT
STENT VIABAHN 6X150X120 (Permanent Stent) ×2 IMPLANT
STENT VIABAHN 6X250X120 (Permanent Stent) ×2 IMPLANT
SYR MEDRAD MARK V 150ML (SYRINGE) ×4 IMPLANT
TUBING CONTRAST HIGH PRESS 72 (TUBING) ×4 IMPLANT
WIRE G V18X300CM (WIRE) ×4 IMPLANT
WIRE J 3MM .035X145CM (WIRE) ×4 IMPLANT

## 2015-01-18 NOTE — Discharge Instructions (Signed)

## 2015-01-18 NOTE — H&P (Signed)
[facility] History & Physical Update  The patient was interviewed and re-examined.  The patient's previous History and Physical has been reviewed and is unchanged.  There is no change in the plan of care.  Felita Bump, MD  01/18/2015, 8:06 AM

## 2015-01-18 NOTE — OR Nursing (Signed)
pta with stent placement to left at, popliteal, sfa, and tp trunk starclose to rfa, sheath removal by dr. Lucky Cowboy.  c timmons, rtr managing rfa site and dressing.  Fentanyl 358mcg, versed 8mg , ancef 1gm and heparin 4000u given during procedure.  To spr for recovery, report given to spr nursing staff at bedside.

## 2015-01-18 NOTE — CV Procedure (Signed)
[facility] Percutaneous Study/Intervention Procedural Note   Date of Surgery: 01/01/2015 Surgeon(s):Sakiya Stepka  Assistants:none Pre-operative Diagnosis: PAD with  Ulceration left lower extremity Post-operative diagnosis: Same  Procedure(s) Performed: 1. Ultrasound guidance for vascular access  right femoral artery 2. Catheter placement into  Left anterior tibial artery from right femoral approach 3. Aortogram and selective  left lower extremity angiogram 4. Percutaneous transluminal angioplasty of  Anterior tibial artery distally with 2.5 mm diameter angioplasty balloon and a 4 mm diameter angioplasty balloon proximally 5.  Percutaneous transluminal angioplasty of below-knee popliteal artery with 4 mm diameter Lutonix drug-coated angioplasty balloon  6.   Percutaneous transluminal angioplasty of above-knee popliteal artery and entire superficial femoral artery with 5 mm diameter angioplasty balloon  7.   Viabahn covered stent placement 2 to the superficial femoral artery and above-knee popliteal artery with 6 mm diameter Viabahn stents. 8. StarClose closure device  right femoral artery   Indications: Patient is a  64 year old male with  Severe and extensive peripheral vascular disease requiring multiple previous interventions. The patient has noninvasive study showing  Recurrent disease in the left lower extremity with nonhealing of his ulcerations and worsening pain. The patient is brought in for angiography for further evaluation and potential treatment. Risks and benefits are discussed and informed consent is obtained  Procedure: The patient was identified and appropriate procedural time out was performed. The patient was then placed supine on the table and prepped and draped in the usual sterile fashion. Ultrasound was used to evaluate the  right common femoral artery.  It was patent . A digital ultrasound image was acquired. A Seldinger needle was used to access the  right common femoral artery under direct ultrasound guidance and a permanent image was performed. A 0.035 J wire was advanced without resistance and a 5Fr sheath was placed. Pigtail catheter was placed into the aorta and an AP aortogram was performed. This demonstrated normal renal arteries and normal aorta and iliac segments without significant stenosis. I then crossed the aortic bifurcation and advanced to the  left femoral head. Selective  left lower extremity angiogram was then performed. This demonstrated  Recurrent occlusion of the superficial femoral artery about 4-5 cm beyond its origin with some reconstitution in the popliteal artery just above the knee. There also appeared to be recurrent occlusion of all tibial vessels. The patient was systemically heparinized and a 6 [place] Ansell sheath was then placed over the [employer] wire. I then used a Kumpe catheter and the advantage wire to cross into the superficial femoral artery and navigate through the occlusion.  I was able to gain access to the popliteal artery and confirming intraluminal flow. I then exchanged for a CXI catheter and ultimately an  0.018 advantage wire to navigate through the anterior tibial artery occlusion. There was a large collateral at the level of the knee which helps supply the lower leg and this was protected from harm. I was able to advance the wire into the foot and then balloon the anterior tibial artery with a 2.5 mm diameter angioplasty balloon from the foot up to the popliteal artery. There was a suboptimal result in the proximal anterior tibial artery and disease in the below-knee popliteal artery that were treated with a 4 mm diameter Lutonix drug-coated angioplasty balloon with improvement in flow and less than 30% residual stenosis. From just below the knee to the origin of the superficial femoral artery , I  treated these areas with angioplasty using 2  inflations with a long 5 mm diameter angioplasty balloon. However, after angioplasty there were still multiple areas of high-grade stenosis or occlusion throughout the SFA and into the above-knee popliteal artery. I elected to treat these areas with Viabahn covered stents. I used a 6 mm diameter by 25 cm length Viabahn covered stent starting just above the knee protecting the geniculate collaterals for the lower leg. This extended to the proximal to mid SFA. I then used a 6 mm diameter by 15 cm length Viabahn covered stent from the proximal SFA about 3-4 cm beyond its origin to the stent just placed in the distal SFA. These were postdilated with a 6 mm balloon with an excellent angiographic completion result and less than 20% residual stenosis throughout the SFA and popliteal artery. At this point, I felt we had improved his circulation as much as possible.I elected to terminate the procedure. The sheath was removed and StarClose closure device was deployed in the left femoral artery with excellent hemostatic result. The patient was taken to the recovery room in stable condition having tolerated the procedure well.  Findings:  Aortogram:  Normal renal arteries. No significant aortoiliac stenosis.  left Lower Extremity:  Long segment SFA and popliteal artery occlusion with severe tibial disease. Improved with intervention as above.     Disposition: Patient was taken to the recovery room in stable condition having tolerated the procedure well.

## 2015-01-19 ENCOUNTER — Encounter: Payer: Self-pay | Admitting: Vascular Surgery

## 2015-07-25 ENCOUNTER — Other Ambulatory Visit: Payer: Self-pay | Admitting: Vascular Surgery

## 2015-07-26 ENCOUNTER — Other Ambulatory Visit: Payer: Self-pay | Admitting: Vascular Surgery

## 2015-07-27 ENCOUNTER — Other Ambulatory Visit
Admission: RE | Admit: 2015-07-27 | Discharge: 2015-07-27 | Disposition: A | Discharge status: Home / Self Care | Payer: Medicaid Other | Source: Ambulatory Visit | Attending: Vascular Surgery | Admitting: Vascular Surgery

## 2015-07-27 DIAGNOSIS — Z029 Encounter for administrative examinations, unspecified: Secondary | ICD-10-CM | POA: Diagnosis present

## 2015-07-27 LAB — CREATININE, SERUM
Creatinine, Ser: 1.23 mg/dL (ref 0.61–1.24)
GFR calc Af Amer: >60 mL/min (ref >60)
GFR calc non Af Amer: >60 mL/min (ref >60)

## 2015-07-27 LAB — BUN: BUN: 16 mg/dL (ref 6–20)

## 2015-07-30 ENCOUNTER — Encounter
Admission: RE | Disposition: A | Discharge status: Skilled Nursing Facility | Payer: Self-pay | Source: Ambulatory Visit | Attending: Vascular Surgery

## 2015-07-30 ENCOUNTER — Ambulatory Visit
Admission: RE | Admit: 2015-07-30 | Discharge: 2015-07-30 | Disposition: A | Discharge status: Skilled Nursing Facility | Payer: Medicaid Other | Source: Ambulatory Visit | Attending: Vascular Surgery | Admitting: Vascular Surgery

## 2015-07-30 ENCOUNTER — Encounter: Payer: Self-pay | Admitting: *Deleted

## 2015-07-30 DIAGNOSIS — Z7902 Long term (current) use of antithrombotics/antiplatelets: Secondary | ICD-10-CM | POA: Diagnosis not present

## 2015-07-30 DIAGNOSIS — Z79899 Other long term (current) drug therapy: Secondary | ICD-10-CM | POA: Insufficient documentation

## 2015-07-30 DIAGNOSIS — Z7901 Long term (current) use of anticoagulants: Secondary | ICD-10-CM | POA: Insufficient documentation

## 2015-07-30 DIAGNOSIS — I70223 Atherosclerosis of native arteries of extremities with rest pain, bilateral legs: Secondary | ICD-10-CM | POA: Diagnosis not present

## 2015-07-30 DIAGNOSIS — I509 Heart failure, unspecified: Secondary | ICD-10-CM | POA: Diagnosis not present

## 2015-07-30 DIAGNOSIS — J449 Chronic obstructive pulmonary disease, unspecified: Secondary | ICD-10-CM | POA: Diagnosis not present

## 2015-07-30 DIAGNOSIS — I70245 Atherosclerosis of native arteries of left leg with ulceration of other part of foot: Secondary | ICD-10-CM | POA: Insufficient documentation

## 2015-07-30 DIAGNOSIS — L97529 Non-pressure chronic ulcer of other part of left foot with unspecified severity: Secondary | ICD-10-CM | POA: Insufficient documentation

## 2015-07-30 DIAGNOSIS — I10 Essential (primary) hypertension: Secondary | ICD-10-CM | POA: Diagnosis not present

## 2015-07-30 HISTORY — PX: PERIPHERAL VASCULAR CATHETERIZATION: SHX172C

## 2015-07-30 SURGERY — LOWER EXTREMITY INTERVENTION
Wound class: Clean

## 2015-07-30 MED ORDER — MIDAZOLAM HCL 5 MG/5ML IJ SOLN
INTRAMUSCULAR | Status: AC
Start: 2015-07-30 — End: 2015-07-30
  Filled 2015-07-30: qty 5

## 2015-07-30 MED ORDER — FENTANYL CITRATE (PF) 100 MCG/2ML IJ SOLN
INTRAMUSCULAR | Status: AC
  Filled 2015-07-30: qty 2

## 2015-07-30 MED ORDER — DEXTROSE 5 % IV SOLN
1.5000 g | INTRAVENOUS | Status: AC
  Administered 2015-07-30: 1.5 g via INTRAVENOUS

## 2015-07-30 MED ORDER — SODIUM CHLORIDE 0.9 % IV SOLN
INTRAVENOUS | Status: DC
Start: 2015-07-30 — End: 2015-07-30
  Administered 2015-07-30: 09:00:00 via INTRAVENOUS

## 2015-07-30 MED ORDER — MIDAZOLAM HCL 2 MG/2ML IJ SOLN
INTRAMUSCULAR | Status: AC
  Filled 2015-07-30: qty 2

## 2015-07-30 MED ORDER — OXYCODONE-ACETAMINOPHEN 5-325 MG PO TABS: 1.0000 | ORAL_TABLET | ORAL | Status: DC | PRN

## 2015-07-30 MED ORDER — LIDOCAINE-EPINEPHRINE (PF) 1 %-1:200000 IJ SOLN
INTRAMUSCULAR | Status: AC
  Filled 2015-07-30: qty 30

## 2015-07-30 MED ORDER — IOHEXOL 300 MG/ML  SOLN
INTRAMUSCULAR | Status: DC | PRN
Start: 2015-07-30 — End: 2015-07-30
  Administered 2015-07-30: 110 mL via INTRA_ARTERIAL

## 2015-07-30 MED ORDER — ACETAMINOPHEN 325 MG PO TABS
325.0000 mg | ORAL_TABLET | ORAL | Status: DC | PRN
Start: 2015-07-30 — End: 2015-07-30

## 2015-07-30 MED ORDER — MIDAZOLAM HCL 2 MG/2ML IJ SOLN
INTRAMUSCULAR | Status: DC | PRN
  Administered 2015-07-30: 1 mg via INTRAVENOUS
  Administered 2015-07-30 (×3): 2 mg via INTRAVENOUS

## 2015-07-30 MED ORDER — HEPARIN SODIUM (PORCINE) 1000 UNIT/ML IJ SOLN
INTRAMUSCULAR | Status: AC
  Filled 2015-07-30: qty 1

## 2015-07-30 MED ORDER — ONDANSETRON HCL 4 MG/2ML IJ SOLN: 4.0000 mg | Freq: Four times a day (QID) | INTRAMUSCULAR | Status: DC | PRN

## 2015-07-30 MED ORDER — LIDOCAINE-EPINEPHRINE (PF) 1 %-1:200000 IJ SOLN
INTRAMUSCULAR | Status: DC | PRN
  Administered 2015-07-30: 10 mL via INTRADERMAL

## 2015-07-30 MED ORDER — FAMOTIDINE 20 MG PO TABS: 40.0000 mg | ORAL_TABLET | ORAL | Status: DC | PRN

## 2015-07-30 MED ORDER — ACETAMINOPHEN 325 MG RE SUPP
325.0000 mg | RECTAL | Status: DC | PRN
  Filled 2015-07-30: qty 2

## 2015-07-30 MED ORDER — HEPARIN (PORCINE) IN NACL 2-0.9 UNIT/ML-% IJ SOLN
INTRAMUSCULAR | Status: AC
  Filled 2015-07-30: qty 1000

## 2015-07-30 MED ORDER — HYDRALAZINE HCL 20 MG/ML IJ SOLN: 5.0000 mg | INTRAMUSCULAR | Status: DC | PRN

## 2015-07-30 MED ORDER — METOPROLOL TARTRATE 1 MG/ML IV SOLN: 2.0000 mg | INTRAVENOUS | Status: DC | PRN

## 2015-07-30 MED ORDER — SODIUM CHLORIDE 0.9 % IV SOLN: 500.0000 mL | Freq: Once | INTRAVENOUS | Status: DC | PRN

## 2015-07-30 MED ORDER — HYDROMORPHONE HCL 1 MG/ML IJ SOLN: 1.0000 mg | Freq: Once | INTRAMUSCULAR | Status: DC

## 2015-07-30 MED ORDER — GUAIFENESIN-DM 100-10 MG/5ML PO SYRP
15.0000 mL | ORAL_SOLUTION | ORAL | Status: DC | PRN
  Filled 2015-07-30: qty 15

## 2015-07-30 MED ORDER — METHYLPREDNISOLONE SODIUM SUCC 125 MG IJ SOLR: 125.0000 mg | INTRAMUSCULAR | Status: DC | PRN

## 2015-07-30 MED ORDER — HEPARIN SODIUM (PORCINE) 1000 UNIT/ML IJ SOLN
INTRAMUSCULAR | Status: DC | PRN
  Administered 2015-07-30: 5000 [IU] via INTRAVENOUS

## 2015-07-30 MED ORDER — HYDROMORPHONE HCL 1 MG/ML IJ SOLN: 0.5000 mg | INTRAMUSCULAR | Status: DC | PRN

## 2015-07-30 MED ORDER — FENTANYL CITRATE (PF) 100 MCG/2ML IJ SOLN
INTRAMUSCULAR | Status: DC | PRN
  Administered 2015-07-30 (×5): 50 ug via INTRAVENOUS
  Administered 2015-07-30: 100 ug via INTRAVENOUS

## 2015-07-30 MED ORDER — LABETALOL HCL 5 MG/ML IV SOLN: 10.0000 mg | INTRAVENOUS | Status: DC | PRN

## 2015-07-30 MED ORDER — PHENOL 1.4 % MT LIQD
1.0000 | OROMUCOSAL | Status: DC | PRN
  Filled 2015-07-30: qty 177

## 2015-07-30 SURGICAL SUPPLY — 29 items
BALLN LUTONIX 4X150X130 (BALLOONS) ×5
BALLN LUTONIX 6X150X130 (BALLOONS) ×5
BALLN LUTONIX DCB 5X60X130 (BALLOONS) ×5
BALLN LUTONIX DCB 7X60X130 (BALLOONS) ×10
BALLN ULTRVRSE 2.5X300X150 (BALLOONS) ×5
BALLN ULTRVRSE 3.5X100X150 (BALLOONS) ×5
BALLN ULTRVRSE 5X300X150 (BALLOONS) ×5
BALLOON LUTONIX 4X150X130 (BALLOONS) ×1 IMPLANT
BALLOON LUTONIX 6X150X130 (BALLOONS) ×1 IMPLANT
BALLOON LUTONIX DCB 5X60X130 (BALLOONS) ×1 IMPLANT
BALLOON LUTONIX DCB 7X60X130 (BALLOONS) ×2 IMPLANT
BALLOON ULTRVRSE 2.5X300X150 (BALLOONS) ×1 IMPLANT
BALLOON ULTRVRSE 3.5X100X150 (BALLOONS) ×1 IMPLANT
BALLOON ULTRVRSE 5X300X150 (BALLOONS) ×1 IMPLANT
CATH CXI SUPP ANG 4FR 135 (MICROCATHETER) ×1 IMPLANT
CATH CXI SUPP ANG 4FR 135CM (MICROCATHETER) ×5
CATH PIG 70CM (CATHETERS) ×5 IMPLANT
CATH VERT 100CM (CATHETERS) ×3 IMPLANT
DEVICE PRESTO INFLATION (MISCELLANEOUS) ×3 IMPLANT
DEVICE STARCLOSE SE CLOSURE (Vascular Products) ×3 IMPLANT
GLIDEWIRE ADV .035X260CM (WIRE) ×3 IMPLANT
GUIDEWIRE PFTE-COATED .018X300 (WIRE) ×3 IMPLANT
PACK ANGIOGRAPHY (CUSTOM PROCEDURE TRAY) ×5 IMPLANT
SHEATH ANL2 6FRX45 HC (SHEATH) ×3 IMPLANT
SHEATH BRITE TIP 5FRX11 (SHEATH) ×5 IMPLANT
SYR MEDRAD MARK V 150ML (SYRINGE) ×5 IMPLANT
TUBING CONTRAST HIGH PRESS 72 (TUBING) ×5 IMPLANT
WIRE G V18X300CM (WIRE) ×3 IMPLANT
WIRE J 3MM .035X145CM (WIRE) ×5 IMPLANT

## 2015-07-30 NOTE — Discharge Instructions (Signed)
Angiogram, Care After °Refer to this sheet in the next few weeks. These instructions provide you with information about caring for yourself after your procedure. Your health care provider may also give you more specific instructions. Your treatment has been planned according to current medical practices, but problems sometimes occur. Call your health care provider if you have any problems or questions after your procedure. °WHAT TO EXPECT AFTER THE PROCEDURE °After your procedure, it is typical to have the following: °· Bruising at the catheter insertion site that usually fades within 1-2 weeks. °· Blood collecting in the tissue (hematoma) that may be painful to the touch. It should usually decrease in size and tenderness within 1-2 weeks. °HOME CARE INSTRUCTIONS °· Take medicines only as directed by your health care provider. °· You may shower 24-48 hours after the procedure or as directed by your health care provider. Remove the bandage (dressing) and gently wash the site with plain soap and water. Pat the area dry with a clean towel. Do not rub the site, because this may cause bleeding. °· Do not take baths, swim, or use a hot tub until your health care provider approves. °· Check your insertion site every day for redness, swelling, or drainage. °· Do not apply powder or lotion to the site. °· Do not lift over 10 lb (4.5 kg) for 5 days after your procedure or as directed by your health care provider. °· Ask your health care provider when it is okay to: °¨ Return to work or school. °¨ Resume usual physical activities or sports. °¨ Resume sexual activity. °· Do not drive home if you are discharged the same day as the procedure. Have someone else drive you. °· You may drive 24 hours after the procedure unless otherwise instructed by your health care provider. °· Do not operate machinery or power tools for 24 hours after the procedure or as directed by your health care provider. °· If your procedure was done as an  outpatient procedure, which means that you went home the same day as your procedure, a responsible adult should be with you for the first 24 hours after you arrive home. °· Keep all follow-up visits as directed by your health care provider. This is important. °SEEK MEDICAL CARE IF: °· You have a fever. °· You have chills. °· You have increased bleeding from the catheter insertion site. Hold pressure on the site. °SEEK IMMEDIATE MEDICAL CARE IF: °· You have unusual pain at the catheter insertion site. °· You have redness, warmth, or swelling at the catheter insertion site. °· You have drainage (other than a small amount of blood on the dressing) from the catheter insertion site. °· The catheter insertion site is bleeding, and the bleeding does not stop after 30 minutes of holding steady pressure on the site. °· The area near or just beyond the catheter insertion site becomes pale, cool, tingly, or numb. °  °This information is not intended to replace advice given to you by your health care provider. Make sure you discuss any questions you have with your health care provider. °  °Document Released: 02/20/2005 Document Revised: 08/25/2014 Document Reviewed: 01/05/2013 °Elsevier Interactive Patient Education ©2016 [employer] ° °

## 2015-07-30 NOTE — H&P (Signed)
  [facility] History & Physical Update  The patient was interviewed and re-examined.  The patient's previous History and Physical has been reviewed and is unchanged.  There is no change in the plan of care. We plan to proceed with the scheduled procedure.  DEW,JASON, MD  07/30/2015, 9:13 AM

## 2015-07-30 NOTE — Op Note (Signed)
[facility] Percutaneous Study/Intervention Procedural Note   Date of Surgery: 07/30/2015  Surgeon(s):Timothy Juarez   Assistants:none  Pre-operative Diagnosis: PAD with ulceration [name], pain BLE  Post-operative diagnosis: Same  Procedure(s) Performed: 1. Ultrasound guidance for vascular access right femoral artery 2. Catheter placement into left peroneal artery from right femoral approach 3. Aortogram and selective left lower extremity angiogram 4. Percutaneous transluminal angioplasty of right external iliac artery with 7 mm diameter by 6 cm length Lutonix drug-coated angioplasty balloon 5. Percutaneous transluminal angioplasty of left external iliac artery with 7 mm diameter by 6 cm length Lutonix drug-coated angioplasty balloon  6.  Percutaneous transluminal angioplasty of left peroneal artery with 2.5 mm diameter angioplasty balloon from the distal peroneal artery up through the tibioperoneal trunk.  7.  Percutaneous transluminal angioplasty of proximal peroneal artery and tibioperoneal trunk with 3.5 mm diameter angioplasty balloon  8.  Percutaneous transluminal angioplasty of left popliteal artery with 4 mm diameter Lutonix drug-coated angioplasty balloon and 5 mm diameter conventional angioplasty balloon  9.  Percutaneous transluminal angioplasty of entire SFA for in-stent occlusion with 5 mm diameter by 30 cm length angioplasty balloon  10. Percutaneous transluminal angioplasty of proximal left SFA and common femoral artery with 6 mm diameter by 15 cm length Lutonix drug-coated angioplasty balloon  11. Percutaneous transluminal angioplasty of proximal left profunda femoris artery with 5 mm diameter by 6 cm length Lutonix drug-coated angioplasty balloon 12. StarClose closure device right femoral artery  EBL: 25 cc  Indications: Patient is [name] with extensive  PAD, pain in his feet, and non-healing ulceration of the left foot. The patient has noninvasive study showing multiple areas of stenosis in the [name] and reduced ABIs bilaterally. The patient is brought in for angiography for further evaluation and potential treatment. Risks and benefits are discussed and informed consent is obtained  Procedure: The patient was identified and appropriate procedural time out was performed. The patient was then placed supine on the table and prepped and draped in the usual sterile fashion. Ultrasound was used to evaluate the right common femoral artery. It was patent . A digital ultrasound image was acquired. A Seldinger needle was used to access the right common femoral artery under direct ultrasound guidance and a permanent image was performed. A 0.035 J wire was advanced without resistance and a 5Fr sheath was placed. Pigtail catheter was placed into the aorta and an AP aortogram was performed including pelvic obliques. This demonstrated normal renal arteries and normal aorta with about 65-70% stenosis in the external iliac arteries bilaterally just below each iliac bifurcations. I then crossed the aortic bifurcation and advanced to the left femoral head. Selective left lower extremity angiogram was then performed. This demonstrated a long SFA occlusion, an 85-90% stenosis in the proximal profunda femorus artery, disease in the popliteal artery below the occlusion, and occlusion of all 3 tibial vessels. He had extremely extensive disease which was going to be very difficult to treat. The patient was systemically heparinized and a 6 [place] Ansell sheath was then placed over the [employer] wire. I then used a Kumpe catheter and the advantage wire to initially navigate through the SFA occlusion and confirm intraluminal flow in the popliteal artery. With the short sheath still in the groin, I elected to treat both external iliac arteries. The right external iliac artery  stenosis was treated with a 7 mm diameter by 6 cm length Lutonix drug-coated angioplasty balloon inflated to 10 atm for 1 minute.  Completion angiogram showed a small, nonflow limiting dissection with only about a 20% residual stenosis. The left external iliac artery stenosis was also treated with a 7 mm diameter by 6 cm length Lutonix drug-coated angioplasty balloon. This was inflated to 12 atm for 1 minute. Completion and gram following this showed only about a 15-20% residual stenosis which was not flow limiting. I then navigated through the popliteal occlusion and was able to get into the peroneal artery distally with a 0.018 advantage wire and a CXI catheter. The peroneal artery was treated with a 2.5 mm diameter angioplasty balloon from just above the ankle up to the popliteal artery. This was inflated to 12 atm for 1 minute. The more proximal peroneal artery and tibioperoneal trunk were treated with a 3 mm diameter angioplasty balloon. The popliteal artery was treated with a 4 mm diameter by 15 cm length Lutonix drug-coated angioplasty balloon inflated to 10 atm for 1 minute. The entire SFA was then treated with a 5 mm diameter by 30 cm length angioplasty balloon inflated twice from the above-knee popliteal artery up to the common femoral artery. This was inflated to 12 atm. The most proximal SFA was treated with a 6 mm diameter by 15 cm length Lutonix drug-coated angioplasty balloon from the common femoral artery down into the proximal superficial femoral artery. Despite all these interventions, the dense disease in the chronic thrombus made flow sluggish and there was seen to be extravasation in the peroneal artery distally. He had extensive collaterals from his diseased profunda femoris artery, and particularly with the extravasation distally and felt to try to treat this with at least improve the perfusion to his foot and allow [place] to consider an intervention on another day. I then pulled the wire back to  the common femoral artery and selectively cannulated the profunda femoris artery confirming intraluminal flow beyond the stenosis. I then used a 5 mm diameter by 6 cm length Lutonix drug-coated angioplasty balloon inflated to 6 atm in the proximal profunda femoris artery for 1 minute. Completion angiogram showed mild residual stenosis of about 30-40% that was not flow limiting, and I did not want to be more aggressive in this vessel. At this point, although extensive disease remained, I felt we had done all that we could do from a percutaneous standpoint today. The sheath was removed and StarClose closure device was deployed in the usual fashion with excellent hemostatic result. The patient was taken to the recovery room in stable condition having tolerated the procedure well.  Findings:  Aortogram: bilateral moderate external iliac artery stenosis Left Lower Extremity: extensive multilevel disease   Disposition: Patient was taken to the recovery room in stable condition having tolerated the procedure well.  Complications: None  Timothy Juarez 07/30/2015 11:06 AM

## 2015-07-31 ENCOUNTER — Encounter: Payer: Self-pay | Admitting: Vascular Surgery

## 2015-08-22 ENCOUNTER — Encounter: Payer: Self-pay | Admitting: *Deleted

## 2015-08-22 ENCOUNTER — Encounter
Admission: RE | Disposition: A | Discharge status: Home / Self Care | Payer: Self-pay | Source: Ambulatory Visit | Attending: Vascular Surgery

## 2015-08-22 ENCOUNTER — Ambulatory Visit
Admission: RE | Admit: 2015-08-22 | Discharge: 2015-08-22 | Disposition: A | Discharge status: Home / Self Care | Payer: Medicaid Other | Source: Ambulatory Visit | Attending: Vascular Surgery | Admitting: Vascular Surgery

## 2015-08-22 DIAGNOSIS — J449 Chronic obstructive pulmonary disease, unspecified: Secondary | ICD-10-CM | POA: Insufficient documentation

## 2015-08-22 DIAGNOSIS — Z7901 Long term (current) use of anticoagulants: Secondary | ICD-10-CM | POA: Diagnosis not present

## 2015-08-22 DIAGNOSIS — L97529 Non-pressure chronic ulcer of other part of left foot with unspecified severity: Secondary | ICD-10-CM | POA: Insufficient documentation

## 2015-08-22 DIAGNOSIS — I10 Essential (primary) hypertension: Secondary | ICD-10-CM | POA: Diagnosis not present

## 2015-08-22 DIAGNOSIS — Z79899 Other long term (current) drug therapy: Secondary | ICD-10-CM | POA: Insufficient documentation

## 2015-08-22 DIAGNOSIS — I70262 Atherosclerosis of native arteries of extremities with gangrene, left leg: Secondary | ICD-10-CM | POA: Diagnosis present

## 2015-08-22 DIAGNOSIS — Z87891 Personal history of nicotine dependence: Secondary | ICD-10-CM | POA: Insufficient documentation

## 2015-08-22 DIAGNOSIS — Z7902 Long term (current) use of antithrombotics/antiplatelets: Secondary | ICD-10-CM | POA: Diagnosis not present

## 2015-08-22 DIAGNOSIS — I509 Heart failure, unspecified: Secondary | ICD-10-CM | POA: Diagnosis not present

## 2015-08-22 HISTORY — DX: Insomnia, unspecified: G47.00

## 2015-08-22 HISTORY — DX: Hypokalemia: E87.6

## 2015-08-22 HISTORY — DX: Pressure ulcer of unspecified site, unspecified stage: L89.90

## 2015-08-22 HISTORY — PX: PERIPHERAL VASCULAR CATHETERIZATION: SHX172C

## 2015-08-22 LAB — GLUCOSE, CAPILLARY: Glucose-Capillary: 113 mg/dL — ABNORMAL HIGH (ref 65–99)

## 2015-08-22 SURGERY — LOWER EXTREMITY ANGIOGRAPHY
Anesthesia: Moderate Sedation | Laterality: Left

## 2015-08-22 MED ORDER — HYDROMORPHONE HCL 1 MG/ML IJ SOLN
1.0000 mg | INTRAMUSCULAR | Status: DC | PRN
  Administered 2015-08-22: 1 mg via INTRAVENOUS

## 2015-08-22 MED ORDER — GUAIFENESIN-DM 100-10 MG/5ML PO SYRP
15.0000 mL | ORAL_SOLUTION | ORAL | Status: DC | PRN
  Filled 2015-08-22: qty 15

## 2015-08-22 MED ORDER — OXYCODONE-ACETAMINOPHEN 5-325 MG PO TABS: 1.0000 | ORAL_TABLET | ORAL | Status: DC | PRN

## 2015-08-22 MED ORDER — MIDAZOLAM HCL 2 MG/2ML IJ SOLN
INTRAMUSCULAR | Status: DC | PRN
  Administered 2015-08-22 (×2): 1 mg via INTRAVENOUS
  Administered 2015-08-22: 2 mg via INTRAVENOUS
  Administered 2015-08-22: 1 mg via INTRAVENOUS

## 2015-08-22 MED ORDER — METOPROLOL TARTRATE 1 MG/ML IV SOLN: 2.0000 mg | INTRAVENOUS | Status: DC | PRN

## 2015-08-22 MED ORDER — DEXTROSE 5 % IV SOLN
1.5000 g | Freq: Once | INTRAVENOUS | Status: AC
  Administered 2015-08-22: 1.5 g via INTRAVENOUS

## 2015-08-22 MED ORDER — SODIUM CHLORIDE 0.9 % IV SOLN: INTRAVENOUS | Status: DC

## 2015-08-22 MED ORDER — LABETALOL HCL 5 MG/ML IV SOLN: 10.0000 mg | INTRAVENOUS | Status: DC | PRN

## 2015-08-22 MED ORDER — ACETAMINOPHEN 325 MG PO TABS: 325.0000 mg | ORAL_TABLET | ORAL | Status: DC | PRN

## 2015-08-22 MED ORDER — MIDAZOLAM HCL 5 MG/5ML IJ SOLN
INTRAMUSCULAR | Status: AC
  Filled 2015-08-22: qty 5

## 2015-08-22 MED ORDER — HEPARIN SODIUM (PORCINE) 1000 UNIT/ML IJ SOLN
INTRAMUSCULAR | Status: DC | PRN
  Administered 2015-08-22: 4000 [IU] via INTRAVENOUS

## 2015-08-22 MED ORDER — PHENOL 1.4 % MT LIQD
1.0000 | OROMUCOSAL | Status: DC | PRN
  Filled 2015-08-22: qty 177

## 2015-08-22 MED ORDER — HYDROMORPHONE HCL 1 MG/ML IJ SOLN
INTRAMUSCULAR | Status: AC
  Administered 2015-08-22: 1 mg via INTRAVENOUS
  Filled 2015-08-22: qty 1

## 2015-08-22 MED ORDER — HYDRALAZINE HCL 20 MG/ML IJ SOLN: 5.0000 mg | INTRAMUSCULAR | Status: DC | PRN

## 2015-08-22 MED ORDER — ACETAMINOPHEN 325 MG RE SUPP
325.0000 mg | RECTAL | Status: DC | PRN
Start: 2015-08-22 — End: 2015-08-22
  Filled 2015-08-22: qty 2

## 2015-08-22 MED ORDER — FENTANYL CITRATE (PF) 100 MCG/2ML IJ SOLN
INTRAMUSCULAR | Status: DC | PRN
  Administered 2015-08-22 (×4): 50 ug via INTRAVENOUS

## 2015-08-22 MED ORDER — HEPARIN SODIUM (PORCINE) 1000 UNIT/ML IJ SOLN
INTRAMUSCULAR | Status: AC
  Filled 2015-08-22: qty 1

## 2015-08-22 MED ORDER — FENTANYL CITRATE (PF) 100 MCG/2ML IJ SOLN
INTRAMUSCULAR | Status: AC
  Filled 2015-08-22: qty 2

## 2015-08-22 MED ORDER — LIDOCAINE-EPINEPHRINE (PF) 1 %-1:200000 IJ SOLN
INTRAMUSCULAR | Status: DC | PRN
  Administered 2015-08-22: 10 mL via INTRADERMAL

## 2015-08-22 MED ORDER — ONDANSETRON HCL 4 MG/2ML IJ SOLN: 4.0000 mg | Freq: Four times a day (QID) | INTRAMUSCULAR | Status: DC | PRN

## 2015-08-22 MED ORDER — SODIUM CHLORIDE 0.9 % IJ SOLN
INTRAMUSCULAR | Status: AC
  Filled 2015-08-22: qty 15

## 2015-08-22 MED ORDER — HEPARIN (PORCINE) IN NACL 2-0.9 UNIT/ML-% IJ SOLN
INTRAMUSCULAR | Status: AC
  Filled 2015-08-22: qty 1000

## 2015-08-22 MED ORDER — LIDOCAINE-EPINEPHRINE (PF) 1 %-1:200000 IJ SOLN
INTRAMUSCULAR | Status: AC
  Filled 2015-08-22: qty 30

## 2015-08-22 MED ORDER — SODIUM CHLORIDE 0.9 % IV SOLN: 500.0000 mL | Freq: Once | INTRAVENOUS | Status: DC | PRN

## 2015-08-22 MED ORDER — HYDROMORPHONE HCL 1 MG/ML IJ SOLN: 0.5000 mg | INTRAMUSCULAR | Status: DC | PRN

## 2015-08-22 MED ORDER — FENTANYL CITRATE (PF) 100 MCG/2ML IJ SOLN
INTRAMUSCULAR | Status: AC
Start: 2015-08-22 — End: 2015-08-22
  Filled 2015-08-22: qty 2

## 2015-08-22 MED ORDER — ONDANSETRON HCL 4 MG/2ML IJ SOLN: 4.0000 mg | INTRAMUSCULAR | Status: DC | PRN

## 2015-08-22 SURGICAL SUPPLY — 20 items
BALLN LUTONIX DCB 5X60X130 (BALLOONS) ×3
BALLN LUTONIX DCB 7X40X130 (BALLOONS) ×3
BALLN ULTRVRSE 7X60X75C (BALLOONS) ×3
BALLOON LUTONIX DCB 5X60X130 (BALLOONS) IMPLANT
BALLOON LUTONIX DCB 7X40X130 (BALLOONS) IMPLANT
BALLOON ULTRVRSE 7X60X75C (BALLOONS) IMPLANT
CATH PIG 70CM (CATHETERS) ×3 IMPLANT
DEVICE PRESTO INFLATION (MISCELLANEOUS) ×2 IMPLANT
DEVICE STARCLOSE SE CLOSURE (Vascular Products) ×2 IMPLANT
GLIDECATH NONTAPER ANGL 5FR (CATHETERS) ×3 IMPLANT
GLIDEWIRE ADV .035X260CM (WIRE) ×3 IMPLANT
PACK ANGIOGRAPHY (CUSTOM PROCEDURE TRAY) ×3 IMPLANT
SHEATH ANL2 6FRX45 HC (SHEATH) ×3 IMPLANT
SHEATH BRITE TIP 4FRX11 (SHEATH) ×3 IMPLANT
SHEATH BRITE TIP 5FRX11 (SHEATH) ×3 IMPLANT
STENT LIFESTAR 8X60X80 (Permanent Stent) ×2 IMPLANT
SYR MEDRAD MARK V 150ML (SYRINGE) ×3 IMPLANT
TUBING CONTRAST HIGH PRESS 72 (TUBING) ×3 IMPLANT
WIRE AMPLATZ SS .035X180 (WIRE) ×3 IMPLANT
WIRE J 3MM .035X145CM (WIRE) ×3 IMPLANT

## 2015-08-22 NOTE — H&P (Signed)
  [facility] History & Physical Update  The patient was interviewed and re-examined.  The patient's previous History and Physical has been reviewed and is unchanged.  There is no change in the plan of care. We plan to proceed with the scheduled procedure.  Lendell Gallick, MD  08/22/2015, 4:21 PM

## 2015-08-22 NOTE — Discharge Instructions (Signed)

## 2015-08-22 NOTE — Op Note (Signed)
[facility] Percutaneous Study/Intervention Procedural Note   Date of Surgery: 08/22/2015  Surgeon(s):Lorene Klimas   Assistants:none  Pre-operative Diagnosis: PAD with ulceration/gangrene [name]  Post-operative diagnosis: Same  Procedure(s) Performed: 1. Ultrasound guidance for vascular access right femoral artery 2. Catheter placement into left profunda femoris artery from right femoral approach 3. Aortogram and selective left lower extremity angiogram 4. Percutaneous transluminal angioplasty of left profunda femoris artery with 5 mm diameter x 6 cm length Lutonix drug coated angioplasty balloon 5. Percutaneous transluminal angioplasty of the left common femoral artery with 7 mm diameter x 4 cm length Lutonix drug coated angioplasty balloon  6.  Self expanding stent to the left external iliac artery with 8 mm diameter x 6 cm length stent 7. StarClose closure device right femoral artery  EBL: 25 cc  Contrast: 55 cc  Moderate conscious sedation time: approximately 45 minutes  Indications: Patient is a 65 yo male with extensive PAD and ulceration and gangrenous changes of the left foot. The patient has noninvasive study showing minimal flow in the Left leg. The patient is brought in for angiography for further evaluation and potential treatment. Risks and benefits are discussed and informed consent is obtained  Procedure: The patient was identified and appropriate procedural time out was performed. The patient was then placed supine on the table and prepped and draped in the usual sterile fashion. Ultrasound was used to evaluate the right common femoral artery. It was patent . A digital ultrasound image was acquired. A Seldinger needle was used to access the right common femoral artery under direct ultrasound guidance and a permanent image was performed. A 0.035 J wire  was advanced without resistance and a 5Fr sheath was placed. Pigtail catheter was placed into the aorta and an AP aortogram was performed. This demonstrated normal renal arteries and normal aorta without significant stenosis. The right iliac appeared patent. The left external iliac artery had what appeared to be significant stenosis of about 60-70% an either a dissection or chronic thrombus limiting the inflow. I then crossed the aortic bifurcation and advanced to the left femoral head. Selective left lower extremity angiogram was then performed. This demonstrated a long occlusion of the left SFA and popliteal artery with essentially no identifiable tibial reconstitution distally at this point. He also had a high-grade stenosis of the profunda femoris artery near its origin of about 80-85% as well as about a 60% stenosis in the proximal common femoral artery. The patient was systemically heparinized and a 6 [place] Ansell sheath was then placed over the [employer] wire. I then used a Kumpe catheter and the advantage wire to navigate across the profunda femoris artery stenosis and to confirm intraluminal flow with a Kumpe catheter and the profunda beyond the stenosis. The profunda femoris stenosis was treated with a 5 mm diameter by 6 cm length Lutonix drug-coated angioplasty balloon inflated to 12 atm for 1 minute. Angiogram following this showed about a 30-40% residual stenosis and I did not want to increase the size of the balloon any larger. I then treated the left external iliac artery chronic dissection/thrombus and stenosis with an 8 mm diameter by 6 cm length self-expanding stent postdilated with a 7 mm balloon to just above the top of the femoral head. Less than 10% residual stenosis was identified after stent placement. To address the common femoral lesion, a 7 mm diameter by 4 cm length Lutonix drug-coated angioplasty balloon was inflated to 12 atm for 1 minute with only about  a 20-30% residual  stenosis identified. I elected to terminate the procedure. The sheath was removed and StarClose closure device was deployed in the left femoral artery with excellent hemostatic result. The patient was taken to the recovery room in stable condition having tolerated the procedure well.  Findings:  Aortogram: Renal arteries patent aorta patent. Right iliac without significant stenosis identified today. Left external iliac artery with chronic thrombus and dissection and stenosis of 60-70%. Left Lower Extremity: Long SFA and popliteal occlusion with no good tibial target seen distally. Common femoral stenosis which was moderate and a high-grade stenosis of the origin of the profunda femoris artery.   Disposition: Patient was taken to the recovery room in stable condition having tolerated the procedure well.  Complications: None  Jacqui Headen 08/22/2015 4:23 PM

## 2015-08-22 NOTE — Procedures (Signed)
Report called to Gwenette Greet, Therapist, sports at [facility].

## 2015-08-23 ENCOUNTER — Encounter: Payer: Self-pay | Admitting: Vascular Surgery

## 2015-09-14 ENCOUNTER — Other Ambulatory Visit: Payer: Self-pay | Admitting: Vascular Surgery

## 2015-09-14 ENCOUNTER — Inpatient Hospital Stay
Admission: AD | Admit: 2015-09-14 | Discharge: 2015-09-24 | DRG: 240 | Disposition: A | Discharge status: Skilled Nursing Facility | Payer: Medicaid Other | Source: Ambulatory Visit | Attending: Vascular Surgery | Admitting: Vascular Surgery

## 2015-09-14 ENCOUNTER — Encounter: Payer: Self-pay | Admitting: *Deleted

## 2015-09-14 DIAGNOSIS — Z87891 Personal history of nicotine dependence: Secondary | ICD-10-CM

## 2015-09-14 DIAGNOSIS — I5032 Chronic diastolic (congestive) heart failure: Secondary | ICD-10-CM | POA: Diagnosis present

## 2015-09-14 DIAGNOSIS — Z79899 Other long term (current) drug therapy: Secondary | ICD-10-CM

## 2015-09-14 DIAGNOSIS — I70262 Atherosclerosis of native arteries of extremities with gangrene, left leg: Secondary | ICD-10-CM | POA: Diagnosis present

## 2015-09-14 DIAGNOSIS — I70222 Atherosclerosis of native arteries of extremities with rest pain, left leg: Secondary | ICD-10-CM | POA: Diagnosis present

## 2015-09-14 DIAGNOSIS — I70244 Atherosclerosis of native arteries of left leg with ulceration of heel and midfoot: Secondary | ICD-10-CM | POA: Diagnosis not present

## 2015-09-14 DIAGNOSIS — I998 Other disorder of circulatory system: Secondary | ICD-10-CM | POA: Diagnosis present

## 2015-09-14 DIAGNOSIS — L97529 Non-pressure chronic ulcer of other part of left foot with unspecified severity: Secondary | ICD-10-CM | POA: Diagnosis present

## 2015-09-14 DIAGNOSIS — J449 Chronic obstructive pulmonary disease, unspecified: Secondary | ICD-10-CM | POA: Diagnosis present

## 2015-09-14 DIAGNOSIS — L98499 Non-pressure chronic ulcer of skin of other sites with unspecified severity: Secondary | ICD-10-CM

## 2015-09-14 DIAGNOSIS — I7025 Atherosclerosis of native arteries of other extremities with ulceration: Secondary | ICD-10-CM | POA: Diagnosis present

## 2015-09-14 DIAGNOSIS — D72829 Elevated white blood cell count, unspecified: Secondary | ICD-10-CM | POA: Diagnosis not present

## 2015-09-14 DIAGNOSIS — I110 Hypertensive heart disease with heart failure: Secondary | ICD-10-CM | POA: Diagnosis present

## 2015-09-14 DIAGNOSIS — Z7901 Long term (current) use of anticoagulants: Secondary | ICD-10-CM

## 2015-09-14 DIAGNOSIS — Z79891 Long term (current) use of opiate analgesic: Secondary | ICD-10-CM | POA: Diagnosis not present

## 2015-09-14 DIAGNOSIS — Z8249 Family history of ischemic heart disease and other diseases of the circulatory system: Secondary | ICD-10-CM

## 2015-09-14 DIAGNOSIS — I70209 Unspecified atherosclerosis of native arteries of extremities, unspecified extremity: Secondary | ICD-10-CM | POA: Diagnosis present

## 2015-09-14 DIAGNOSIS — Z7902 Long term (current) use of antithrombotics/antiplatelets: Secondary | ICD-10-CM

## 2015-09-14 LAB — APTT
aPTT: 38 seconds — ABNORMAL HIGH (ref 24–36)
aPTT: 60 seconds — ABNORMAL HIGH (ref 24–36)

## 2015-09-14 LAB — BASIC METABOLIC PANEL
ANION GAP: 11 (ref 5–15)
BUN: 20 mg/dL (ref 6–20)
CALCIUM: 9.3 mg/dL (ref 8.9–10.3)
CO2: 24 mmol/L (ref 22–32)
Chloride: 105 mmol/L (ref 101–111)
Creatinine, Ser: 1.34 mg/dL — ABNORMAL HIGH (ref 0.61–1.24)
GFR, EST NON AFRICAN AMERICAN: 54 mL/min — AB (ref >60)
GLUCOSE: 122 mg/dL — AB (ref 65–99)
POTASSIUM: 4.1 mmol/L (ref 3.5–5.1)
Sodium: 140 mmol/L (ref 135–145)

## 2015-09-14 LAB — CBC
HEMATOCRIT: 36.4 % — AB (ref 40.0–52.0)
HEMOGLOBIN: 11.3 g/dL — AB (ref 13.0–18.0)
MCH: 22.9 pg — ABNORMAL LOW (ref 26.0–34.0)
MCHC: 31.2 g/dL — AB (ref 32.0–36.0)
MCV: 73.5 fL — ABNORMAL LOW (ref 80.0–100.0)
Platelets: 443 10*3/uL — ABNORMAL HIGH (ref 150–440)
RBC: 4.95 MIL/uL (ref 4.40–5.90)
RDW: 16 % — ABNORMAL HIGH (ref 11.5–14.5)
WBC: 21.1 10*3/uL — ABNORMAL HIGH (ref 3.8–10.6)

## 2015-09-14 LAB — MAGNESIUM: MAGNESIUM: 2.3 mg/dL (ref 1.7–2.4)

## 2015-09-14 LAB — ABO/RH: ABO/RH(D): A POS

## 2015-09-14 LAB — TYPE AND SCREEN
ABO/RH(D): A POS
ANTIBODY SCREEN: NEGATIVE

## 2015-09-14 LAB — HEPARIN LEVEL (UNFRACTIONATED)
HEPARIN UNFRACTIONATED: 2.38 [IU]/mL — AB (ref 0.30–0.70)
Heparin Unfractionated: 3.14 IU/mL — ABNORMAL HIGH (ref 0.30–0.70)

## 2015-09-14 LAB — PROTIME-INR
INR: 1.38
PROTHROMBIN TIME: 17.1 s — AB (ref 11.4–15.0)

## 2015-09-14 MED ORDER — OXYCODONE-ACETAMINOPHEN 5-325 MG PO TABS
2.0000 | ORAL_TABLET | ORAL | Status: DC | PRN
  Administered 2015-09-14 – 2015-09-16 (×8): 2 via ORAL
  Filled 2015-09-14 (×8): qty 2

## 2015-09-14 MED ORDER — MOMETASONE FURO-FORMOTEROL FUM 100-5 MCG/ACT IN AERO
2.0000 | INHALATION_SPRAY | Freq: Two times a day (BID) | RESPIRATORY_TRACT | Status: DC
  Administered 2015-09-14 – 2015-09-24 (×20): 2 via RESPIRATORY_TRACT
  Filled 2015-09-14: qty 8.8

## 2015-09-14 MED ORDER — PIPERACILLIN-TAZOBACTAM 3.375 G IVPB
3.3750 g | Freq: Three times a day (TID) | INTRAVENOUS | Status: DC
  Administered 2015-09-14 – 2015-09-19 (×16): 3.375 g via INTRAVENOUS
  Filled 2015-09-14 (×18): qty 50

## 2015-09-14 MED ORDER — PANTOPRAZOLE SODIUM 40 MG PO TBEC
40.0000 mg | DELAYED_RELEASE_TABLET | Freq: Every day | ORAL | Status: DC
  Administered 2015-09-14 – 2015-09-24 (×10): 40 mg via ORAL
  Filled 2015-09-14 (×12): qty 1

## 2015-09-14 MED ORDER — ACETAMINOPHEN 325 MG PO TABS
650.0000 mg | ORAL_TABLET | Freq: Four times a day (QID) | ORAL | Status: DC | PRN
  Administered 2015-09-18: 650 mg via ORAL
  Filled 2015-09-14: qty 2

## 2015-09-14 MED ORDER — BACLOFEN 10 MG PO TABS
5.0000 mg | ORAL_TABLET | Freq: Every day | ORAL | Status: DC
  Administered 2015-09-14 – 2015-09-23 (×10): 5 mg via ORAL
  Filled 2015-09-14 (×6): qty 1
  Filled 2015-09-14: qty 2
  Filled 2015-09-14 (×3): qty 1

## 2015-09-14 MED ORDER — TEMAZEPAM 15 MG PO CAPS: 15.0000 mg | ORAL_CAPSULE | Freq: Every evening | ORAL | Status: DC | PRN

## 2015-09-14 MED ORDER — PIPERACILLIN-TAZOBACTAM 3.375 G IVPB
3.3750 g | Freq: Four times a day (QID) | INTRAVENOUS | Status: DC
Start: 2015-09-14 — End: 2015-09-14

## 2015-09-14 MED ORDER — DOCUSATE SODIUM 100 MG PO CAPS
100.0000 mg | ORAL_CAPSULE | Freq: Two times a day (BID) | ORAL | Status: DC
  Administered 2015-09-14 – 2015-09-24 (×18): 100 mg via ORAL
  Filled 2015-09-14 (×19): qty 1

## 2015-09-14 MED ORDER — ACETAMINOPHEN 650 MG RE SUPP: 650.0000 mg | Freq: Four times a day (QID) | RECTAL | Status: DC | PRN

## 2015-09-14 MED ORDER — OXYCODONE-ACETAMINOPHEN 5-325 MG PO TABS: 1.0000 | ORAL_TABLET | ORAL | Status: DC | PRN

## 2015-09-14 MED ORDER — HEPARIN (PORCINE) IN NACL 100-0.45 UNIT/ML-% IJ SOLN: 12.0000 [IU]/kg/h | INTRAMUSCULAR | Status: DC

## 2015-09-14 MED ORDER — ALUM & MAG HYDROXIDE-SIMETH 200-200-20 MG/5ML PO SUSP: 30.0000 mL | Freq: Four times a day (QID) | ORAL | Status: DC | PRN

## 2015-09-14 MED ORDER — SODIUM CHLORIDE 0.9 % IV SOLN
INTRAVENOUS | Status: DC
  Administered 2015-09-14 – 2015-09-15 (×3): via INTRAVENOUS

## 2015-09-14 MED ORDER — HEPARIN (PORCINE) IN NACL 100-0.45 UNIT/ML-% IJ SOLN
1600.0000 [IU]/h | INTRAMUSCULAR | Status: DC
  Administered 2015-09-14: 1100 [IU]/h via INTRAVENOUS
  Administered 2015-09-15: 1200 [IU]/h via INTRAVENOUS
  Administered 2015-09-16 – 2015-09-17 (×2): 1300 [IU]/h via INTRAVENOUS
  Administered 2015-09-18: 1450 [IU]/h via INTRAVENOUS
  Filled 2015-09-14 (×11): qty 250

## 2015-09-14 MED ORDER — ASPIRIN EC 81 MG PO TBEC
81.0000 mg | DELAYED_RELEASE_TABLET | Freq: Every day | ORAL | Status: DC
  Administered 2015-09-15 – 2015-09-24 (×9): 81 mg via ORAL
  Filled 2015-09-14 (×9): qty 1

## 2015-09-14 MED ORDER — OXYCODONE HCL 5 MG PO TABS: 5.0000 mg | ORAL_TABLET | ORAL | Status: DC | PRN

## 2015-09-14 MED ORDER — POTASSIUM CHLORIDE CRYS ER 20 MEQ PO TBCR
20.0000 meq | EXTENDED_RELEASE_TABLET | Freq: Every day | ORAL | Status: DC
  Administered 2015-09-14 – 2015-09-24 (×10): 20 meq via ORAL
  Filled 2015-09-14 (×2): qty 1
  Filled 2015-09-14: qty 2
  Filled 2015-09-14 (×2): qty 1
  Filled 2015-09-14: qty 2
  Filled 2015-09-14 (×3): qty 1
  Filled 2015-09-14: qty 2
  Filled 2015-09-14 (×2): qty 1

## 2015-09-14 MED ORDER — FUROSEMIDE 40 MG PO TABS
40.0000 mg | ORAL_TABLET | Freq: Two times a day (BID) | ORAL | Status: DC
  Administered 2015-09-14 – 2015-09-18 (×9): 40 mg via ORAL
  Filled 2015-09-14 (×9): qty 1

## 2015-09-14 MED ORDER — ONDANSETRON HCL 4 MG PO TABS: 4.0000 mg | ORAL_TABLET | Freq: Four times a day (QID) | ORAL | Status: DC | PRN

## 2015-09-14 MED ORDER — POLYETHYLENE GLYCOL 3350 17 G PO PACK: 17.0000 g | PACK | Freq: Every day | ORAL | Status: DC | PRN

## 2015-09-14 MED ORDER — TIOTROPIUM BROMIDE MONOHYDRATE 18 MCG IN CAPS
18.0000 ug | ORAL_CAPSULE | Freq: Every day | RESPIRATORY_TRACT | Status: DC
  Administered 2015-09-15 – 2015-09-24 (×10): 18 ug via RESPIRATORY_TRACT
  Filled 2015-09-14 (×2): qty 5

## 2015-09-14 MED ORDER — GABAPENTIN 100 MG PO CAPS
100.0000 mg | ORAL_CAPSULE | Freq: Every day | ORAL | Status: DC
  Administered 2015-09-14 – 2015-09-23 (×10): 100 mg via ORAL
  Filled 2015-09-14 (×10): qty 1

## 2015-09-14 MED ORDER — ONDANSETRON HCL 4 MG/2ML IJ SOLN: 4.0000 mg | Freq: Four times a day (QID) | INTRAMUSCULAR | Status: DC | PRN

## 2015-09-14 NOTE — Progress Notes (Signed)
ANTICOAGULATION CONSULT NOTE - Initial Consult  Pharmacy Consult for Heparin drip Indication: Ischemic lower extremity  No Known Allergies  Patient Measurements: Height: 6\' 1"  (185.4 cm) Weight: 199 lb 14.4 oz (90.674 kg) IBW/kg (Calculated) : 79.9 Heparin Dosing Weight: 90.7 kg  Vital Signs: Temp: 98.2 F (36.8 C) (01/27 1202) Temp Source: Oral (01/27 1202) BP: 147/75 mmHg (01/27 1202) Pulse Rate: 96 (01/27 1202)  Labs: No results for input(s): HGB, HCT, PLT, APTT, LABPROT, INR, HEPARINUNFRC, HEPRLOWMOCWT, CREATININE, CKTOTAL, CKMB, TROPONINI in the last 72 hours.  CrCl cannot be calculated (Patient has no serum creatinine result on file.).   Medical History: Past Medical History  Diagnosis Date  . Hypertension   . Peripheral vascular disease ([place])   . CHF (congestive heart failure) ([place])   . COPD (chronic obstructive pulmonary disease) ([place])   . Hypokalemia   . Insomnia   . Pressure ulcer     Medications:  Prescriptions prior to admission  Medication Sig Dispense Refill Last Dose  . acetaminophen (TYLENOL) 325 MG tablet Take 650 mg by mouth at bedtime.    08/21/2015 at Unknown time  . amoxicillin-clavulanate (AUGMENTIN) 875-125 MG tablet Take 1 tablet by mouth 2 (two) times daily.   08/22/2015 at Unknown time  . apixaban (ELIQUIS) 2.5 MG TABS tablet Take 2.5 mg by mouth 2 (two) times daily.   08/22/2015 at Unknown time  . baclofen (LIORESAL) 10 MG tablet Take 5 mg by mouth at bedtime.   08/21/2015 at Unknown time  . benzocaine (AMERICAINE) 20 % oral spray Use as directed 1 application in the mouth or throat 4 (four) times daily as needed for throat irritation / pain.     . benzocaine (AMERICAINE) 20 % rectal ointment Place rectally every 3 (three) hours as needed for pain.     [name] clopidogrel (PLAVIX) 75 MG tablet Take 75 mg by mouth daily.   08/22/2015 at Unknown time  . Fluticasone-Salmeterol (ADVAIR) 250-50 MCG/DOSE AEPB Inhale 1 puff into the lungs 2 (two) times daily.    08/22/2015 at Unknown time  . furosemide (LASIX) 40 MG tablet Take 40 mg by mouth 2 (two) times daily.    08/22/2015 at Unknown time  . gabapentin (NEURONTIN) 100 MG capsule Take 100 mg by mouth at bedtime.   08/21/2015 at Unknown time  . guaiFENesin (ROBITUSSIN) 100 MG/5ML liquid Take 100 mg by mouth every 6 (six) hours as needed for cough.   08/21/2015 at Unknown time  . Omega-3 Fatty Acids (FISH OIL) 1000 MG CAPS Take 1,000 mg by mouth every morning.   08/22/2015 at Unknown time  . oxycodone (OXY-IR) 5 MG capsule Take 5 mg by mouth every 4 (four) hours as needed.   07/30/2015 at Unknown time  . oxyCODONE-acetaminophen (PERCOCET) 10-325 MG tablet Take 2 tablets by mouth every 6 (six) hours as needed for pain.   08/21/2015 at Unknown time  . potassium chloride (K-DUR) 10 MEQ tablet Take 20 mEq by mouth daily.    08/22/2015 at Unknown time  . sodium chloride (OCEAN) 0.65 % SOLN nasal spray Place 1 spray into both nostrils as needed for congestion.   07/29/2015 at Unknown time  . temazepam (RESTORIL) 15 MG capsule Take 15 mg by mouth at bedtime as needed for sleep.   08/21/2015 at Unknown time  . tiotropium (SPIRIVA) 18 MCG inhalation capsule Place 18 mcg into inhaler and inhale daily.   08/22/2015 at Unknown time   Infusions:  . sodium chloride    .  heparin      Assessment: Patient is a 65yo male admitted for ischemic lower extremity to be initiated on Heparin drip. Patient was appears to have been taking apixaban 5mg  bid prior to admission but patient is unsure when last dose was.   Goal of Therapy:  Heparin level 0.3-0.7 units/ml aPTT 68-109 seconds Monitor platelets by anticoagulation protocol: Yes   Plan:  Start heparin infusion at 1100 units/hr Continue to monitor H&H and platelets  Have ordered a baseline heparin level since patient may have been taking apixaban prior to admission. Will check heparin level as well as an aPTT in 6hr as anticipate that heparin level will be elevated due to  apixaban.  Paulina Fusi, PharmD, BCPS 09/14/2015 12:27 PM

## 2015-09-14 NOTE — Consult Note (Signed)
WOC wound consult note Reason for Consult: Vascular wounds with critical limb ischemia to left lower leg.  Attempts at re-vascularization have not been successful.  Wound type:vascular  Pressure Ulcer POA: N/A Measurement:9 cm x 7 cm x 0.1 cm to left dorsal foot 1 cm x 1 cm x 0.2 cm left heel Necrotic toes left lower extremity Wound [id number] tissue Drainage (amount, consistency, odor) Minimal serosanguinous drainage Periwound:Intact Dressing procedure/placement/frequency:[place] ulcer to left dorsal foot with NS and pat gently dry.  Apply Xeroform gauze to wound bed.  Cover with 4x4 and kerlix.  Change daily.  Awaiting surgical consult.  Will not follow at this time.  Please re-consult if needed.  Domenic Moras RN BSN [place] Pager [phone]

## 2015-09-14 NOTE — H&P (Signed)
[facility] Admission History & Physical  MRN : [id number]  Timothy Juarez is a 65 y.o. (07-04-1951) male who presents with chief complaint of left lower extremity pain and non-healing ulcerations.   History of Present Illness:  Patient well known to our practice. He has a past medical history of severe PAD (claudication, ulcerations, gangrene) with multiple endovascular intervention on his bilateral lower extremities. Past medical history includes hypertension, CHF and COPD. He presented to our clinic today for a three week follow up after undergoing PTA of the left profunda, left CFA and stent placement in the left external iliac artery on 08/22/15. This procedure was performed as there was no improvement in the patient's [name] ulcerations and pain after his prior intervention on 01/18/15. He presents today complaining with worsening left lower extremity pain and "wounds" that are "getting worse". Patient states he has been receiving local wound care from his nursing home however does not see any improvement. Denies any fever, nausea of vomiting. The patient underwent an ABI in our office today which showed Right ABI: 0.68 and Left 0.28 - consistent with critical limb ishcemia (on 08/17/15, Right ABI: 0.61 and Left: unable to assess as patient could not tolerate).   Current Facility-Administered Medications  Medication Dose Route Frequency Provider Last Rate Last Dose  . 0.9 %  sodium chloride infusion   Intravenous Continuous Kimberly A Stegmayer, PA-C      . acetaminophen (TYLENOL) tablet 650 mg  650 mg Oral Q6H PRN Kimberly A Stegmayer, PA-C       Or  . acetaminophen (TYLENOL) suppository 650 mg  650 mg Rectal Q6H PRN Janalyn Harder Stegmayer, PA-C      . alum & mag hydroxide-simeth (MAALOX/MYLANTA) 200-200-20 MG/5ML suspension 30 mL  30 mL Oral Q6H PRN Kimberly A Stegmayer, PA-C      . baclofen (LIORESAL) tablet 5 mg  5 mg Oral QHS Kimberly A Stegmayer, PA-C      .  docusate sodium (COLACE) capsule 100 mg  100 mg Oral BID Kimberly A Stegmayer, PA-C      . furosemide (LASIX) tablet 40 mg  40 mg Oral BID Kimberly A Stegmayer, PA-C      . gabapentin (NEURONTIN) capsule 100 mg  100 mg Oral QHS Kimberly A Stegmayer, PA-C      . heparin ADULT infusion 100 units/mL (25000 units/250 mL)  1,100 Units/hr Intravenous Continuous Kimberly A Stegmayer, PA-C      . mometasone-formoterol (DULERA) 100-5 MCG/ACT inhaler 2 puff  2 puff Inhalation BID Kimberly A Stegmayer, PA-C      . ondansetron (ZOFRAN) tablet 4 mg  4 mg Oral Q6H PRN Kimberly A Stegmayer, PA-C       Or  . ondansetron (ZOFRAN) injection 4 mg  4 mg Intravenous Q6H PRN Kimberly A Stegmayer, PA-C      . oxyCODONE-acetaminophen (PERCOCET/ROXICET) 5-325 MG per tablet 1 tablet  1 tablet Oral Q4H PRN Kimberly A Stegmayer, PA-C      . oxyCODONE-acetaminophen (PERCOCET/ROXICET) 5-325 MG per tablet 2 tablet  2 tablet Oral Q4H PRN Kimberly A Stegmayer, PA-C      . pantoprazole (PROTONIX) EC tablet 40 mg  40 mg Oral Daily Kimberly A Stegmayer, PA-C      . piperacillin-tazobactam (ZOSYN) IVPB 3.375 g  3.375 g Intravenous 3 times per day [employer], PA-C      . polyethylene glycol (MIRALAX / GLYCOLAX) packet 17 g  17 g Oral Daily PRN Sela Hua,  PA-C      . potassium chloride (K-DUR) CR tablet 20 mEq  20 mEq Oral Daily Kimberly A Stegmayer, PA-C      . temazepam (RESTORIL) capsule 15 mg  15 mg Oral QHS PRN Kimberly A Stegmayer, PA-C      . tiotropium (SPIRIVA) inhalation capsule 18 mcg  18 mcg Inhalation Daily Sela Hua, PA-C        Past Medical History  Diagnosis Date  . Hypertension   . Peripheral vascular disease ([place])   . CHF (congestive heart failure) ([place])   . COPD (chronic obstructive pulmonary disease) ([place])   . Hypokalemia   . Insomnia   . Pressure ulcer    Past Surgical History  Procedure Laterality Date  . Peripheral vascular catheterization Left 01/18/2015    Procedure:  Lower Extremity Angiography;  Surgeon: Algernon Huxley, MD;  Location: [facility];  Service: Cardiovascular;  Laterality: Left;  . Peripheral vascular catheterization N/A 01/18/2015    Procedure: Lower Extremity Intervention;  Surgeon: Algernon Huxley, MD;  Location: [facility];  Service: Cardiovascular;  Laterality: N/A;  . Peripheral vascular catheterization  07/30/2015    Procedure: Lower Extremity Intervention;  Surgeon: Algernon Huxley, MD;  Location: [facility];  Service: Cardiovascular;;  . Peripheral vascular catheterization N/A 07/30/2015    Procedure: Abdominal Aortogram w/Lower Extremity;  Surgeon: Algernon Huxley, MD;  Location: [facility];  Service: Cardiovascular;  Laterality: N/A;  . Peripheral vascular catheterization Left 08/22/2015    Procedure: Lower Extremity Angiography;  Surgeon: Algernon Huxley, MD;  Location: [facility];  Service: Cardiovascular;  Laterality: Left;  . Peripheral vascular catheterization Left 08/22/2015    Procedure: Lower Extremity Intervention;  Surgeon: Algernon Huxley, MD;  Location: [facility];  Service: Cardiovascular;  Laterality: Left;   Social History Social History  Substance Use Topics  . Smoking status: Former Smoker -- 1.00 packs/day for 44 years    Types: Cigarettes    Quit date: 11/17/2012  . Smokeless tobacco: Not on file  . Alcohol Use: No   Family History Patient endorses a family history of CAD, hypertension and brain cancer. Denies any family history of coagulation / bleeding disorders, PAD or renal disease.  No Known Allergies   REVIEW OF SYSTEMS (Negative unless checked)  Constitutional: [] Weight loss  [] Fever  [] Chills Cardiac: [] Chest pain   [] Chest pressure   [] Palpitations   [] Shortness of breath when laying flat   [] Shortness of breath at rest   [] Shortness of breath with exertion. Vascular:  [x] Pain in legs with walking   [x] Pain in legs at rest   [x] Pain in legs when laying flat    [x] Claudication   [x] Pain in feet when walking  [x] Pain in feet at rest  [x] Pain in feet when laying flat   [] History of DVT   [] Phlebitis   [x] Swelling in legs   [] Varicose veins   [x] Non-healing ulcers Pulmonary:   [] Uses home oxygen   [] Productive cough   [] Hemoptysis   [] Wheeze  [x] COPD   [] Asthma Neurologic:  [] Dizziness  [] Blackouts   [] Seizures   [] History of stroke   [] History of TIA  [] Aphasia   [] Temporary blindness   [] Dysphagia   [] Weakness or numbness in arms   [] Weakness or numbness in legs Musculoskeletal:  [] Arthritis   [] Joint swelling   [] Joint pain   [] Low back pain Hematologic:  [] Easy bruising  [] Easy bleeding   [] Hypercoagulable state   []   Anemic  [] Hepatitis Gastrointestinal:  [] Blood in stool   [] Vomiting blood  [] Gastroesophageal reflux/heartburn   [] Difficulty swallowing. Genitourinary:  [] Chronic kidney disease   [] Difficult urination  [] Frequent urination  [] Burning with urination   [] Blood in urine Skin:  [] Rashes   [x] Ulcers   [x] Wounds Psychological:  [] History of anxiety   []  History of major depression.  Physical Examination  Filed Vitals:   09/14/15 1202  BP: 147/75  Pulse: 96  Temp: 98.2 F (36.8 C)  TempSrc: Oral  Resp: 18  Height: 6\' 1"  (1.854 m)  Weight: 90.674 kg (199 lb 14.4 oz)  SpO2: 97%   Body mass index is 26.38 kg/(m^2).  Physical Exam: General: Build and Nutrition- Well developed and well nourished. Appearance-  No acute distress.  Integumentary: Inspection of the skin reveals - ulcerations on the lower extremities. Left Lower Extremity: Big Toe, 4th and 5th toe necrotic Forefoot: worsening ulceration, draining serous fluid Heel Ulceration: worsening Very tender to palpation. No infection or cellulitis traveling up leg.  Head and Neck: Trachea- Midline. Head Inspection- Normocephalic, atraumatic. Face General Assessment- No facial droop.  Eye: Eye- EOMI. Conjunctiva clear and Sclera  non-icteric.  ENMT: Ears Assessment of hearing - Intact. Mouth and throat  Oral Cavity/Oropharynx- Oral mucosa moist.  Chest and Lung Exam: Auscultation :Breath sounds- Equal breath sounds bilateral. Inspection - Inspection of the chest reveals: - Prominent veins - None present. Respiratory effort- No increase in respiratory effort.  Cardiovascular: Auscultation - Rhythm- Irregular. Inspection - No JVD.  Abdomen: Palpitation- Soft, non-tender, non-distended.  Peripheral Vascular: Upper Extremity: Palpation:Radial pulse - Bilateral- 2+. Inspection of the upper extremities reveals - Bilateral- No edema present and Rapid capillary refill.  Lower Extremity: Palpation:Dorsalis pedis pulse - Right- 1+. Posterior tibial pulse - Bilateral- Not palpable. Inspection of the lower extremities - Left- Ulcerations present (See Above.) and Gangrenous changes present (multiple toes). Bilateral- Sluggish capillary refill. Edema - Left- Moderate. Right- Note: minimal  Neurologic: Neurologic evaluation reveals - CN 2-12 grossly intact, grips equal, muscle strength in the upper and lower extremities intact.  Neuropsychiatric: Normal exam - Good historian with calm affect and A&Ox3.  Musculoskeletal Gait :- Uses wheelchair. Inspection of the upper extremities reveals: - No gross deformities. Inspection of the lower extremities reveals: - No gross deformities.  Diagnostic Studies 09/14/15 Right ABI: 0.68 and Left 0.28 - consistent with critical limb ishcemia (on 08/17/15, Right ABI: 0.61 and Left: unable to assess as patient could not tolerate).   CBC Lab Results  Component Value Date   WBC 21.1* 09/14/2015   HGB 11.3* 09/14/2015   HCT 36.4* 09/14/2015   MCV 73.5* 09/14/2015   PLT 443* 09/14/2015    BMET    Component Value Date/Time   NA 136 12/01/2013 1317   K 3.9 12/01/2013 1317   CL 100 12/01/2013 1317   CO2 33* 12/01/2013 1317   GLUCOSE 91  12/01/2013 1317   BUN 16 07/27/2015 0811   BUN 24* 09/25/2014 0846   CREATININE 1.23 07/27/2015 0811   CREATININE 1.29 09/25/2014 0846   CALCIUM 9.4 12/01/2013 1317   GFRNONAA >60 07/27/2015 0811   GFRNONAA 60* 09/25/2014 0846   GFRNONAA >60 03/16/2014 0750   GFRAA >60 07/27/2015 0811   GFRAA >60 09/25/2014 0846   GFRAA >60 03/16/2014 0750   CrCl cannot be calculated (Patient has no serum creatinine result on file.).  COAG No results found for: INR, PROTIME  Radiology No results found.  Assessment/Plan The patient is a 65 year  old male who presents to our clinic today after two lower left extremity endovascular intervention with worsening pain and ulceration with gangrene.  1) Admit to Dr. Bunnie Domino service for IV ABX to prevent infection / sepsis in setting of limb ischemia with gangrene. 2) At this point, the patient may need surgical intervention with a [name] BKA as previous endovascular attempts to revascularize have been unsuccessful. 3) Heparin gtt to promote blood flow to limb 4) IV zosyn 5) Pain Control 6) Wound Care Consult 7) Discussed with Dr. Mayme Genta, PA-C  09/14/2015 12:47 PM

## 2015-09-14 NOTE — Progress Notes (Signed)
ANTICOAGULATION CONSULT NOTE - Initial Consult  Pharmacy Consult for Heparin drip Indication: Ischemic lower extremity  No Known Allergies  Patient Measurements: Height: 6\' 1"  (185.4 cm) Weight: 199 lb 14.4 oz (90.674 kg) IBW/kg (Calculated) : 79.9 Heparin Dosing Weight: 90.7 kg  Vital Signs: Temp: 98 F (36.7 C) (01/27 2021) Temp Source: Oral (01/27 2021) BP: 113/54 mmHg (01/27 2021) Pulse Rate: 88 (01/27 2021)  Labs:  Recent Labs  09/14/15 1206 09/14/15 2034  HGB 11.3*  --   HCT 36.4*  --   PLT 443*  --   APTT 38* 60*  LABPROT 17.1*  --   INR 1.38  --   HEPARINUNFRC 3.14* 2.38*  CREATININE 1.34*  --     Estimated Creatinine Clearance: 62.9 mL/min (by C-G formula based on Cr of 1.34).   Medical History: Past Medical History  Diagnosis Date  . Hypertension   . Peripheral vascular disease ([place])   . CHF (congestive heart failure) ([place])   . COPD (chronic obstructive pulmonary disease) ([place])   . Hypokalemia   . Insomnia   . Pressure ulcer     Medications:  Prescriptions prior to admission  Medication Sig Dispense Refill Last Dose  . amoxicillin-clavulanate (AUGMENTIN) 875-125 MG tablet Take 1 tablet by mouth 2 (two) times daily.   09/13/2015 at Unknown time  . apixaban (ELIQUIS) 2.5 MG TABS tablet Take 2.5 mg by mouth 2 (two) times daily.   09/13/2015 at Unknown time  . acetaminophen (TYLENOL) 325 MG tablet Take 650 mg by mouth at bedtime.    08/21/2015 at Unknown time  . baclofen (LIORESAL) 10 MG tablet Take 5 mg by mouth at bedtime. Reported on 09/14/2015   08/21/2015 at Unknown time  . benzocaine (AMERICAINE) 20 % oral spray Use as directed 1 application in the mouth or throat 4 (four) times daily as needed for throat irritation / pain. Reported on 09/14/2015   Not Taking  . benzocaine (AMERICAINE) 20 % rectal ointment Place rectally every 3 (three) hours as needed for pain. Reported on 09/14/2015   Not Taking at Unknown time  . clopidogrel (PLAVIX) 75 MG tablet Take  75 mg by mouth daily.   08/22/2015 at Unknown time  . Fluticasone-Salmeterol (ADVAIR) 250-50 MCG/DOSE AEPB Inhale 1 puff into the lungs 2 (two) times daily.   08/22/2015 at Unknown time  . furosemide (LASIX) 40 MG tablet Take 40 mg by mouth 2 (two) times daily.    08/22/2015 at Unknown time  . gabapentin (NEURONTIN) 100 MG capsule Take 100 mg by mouth at bedtime.   08/21/2015 at Unknown time  . guaiFENesin (ROBITUSSIN) 100 MG/5ML liquid Take 100 mg by mouth every 6 (six) hours as needed for cough.   08/21/2015 at Unknown time  . Omega-3 Fatty Acids (FISH OIL) 1000 MG CAPS Take 1,000 mg by mouth every morning.   08/22/2015 at Unknown time  . oxycodone (OXY-IR) 5 MG capsule Take 5 mg by mouth every 4 (four) hours as needed.   07/30/2015 at Unknown time  . oxyCODONE-acetaminophen (PERCOCET) 10-325 MG tablet Take 2 tablets by mouth every 6 (six) hours as needed for pain.   08/21/2015 at Unknown time  . potassium chloride (K-DUR) 10 MEQ tablet Take 20 mEq by mouth daily.    08/22/2015 at Unknown time  . sodium chloride (OCEAN) 0.65 % SOLN nasal spray Place 1 spray into both nostrils as needed for congestion.   07/29/2015 at Unknown time  . temazepam (RESTORIL) 15 MG capsule Take 15  mg by mouth at bedtime as needed for sleep.   08/21/2015 at Unknown time  . tiotropium (SPIRIVA) 18 MCG inhalation capsule Place 18 mcg into inhaler and inhale daily.   08/22/2015 at Unknown time   Infusions:  . sodium chloride 75 mL/hr at 09/14/15 1437  . heparin 1,100 Units/hr (09/14/15 1421)    Assessment: Patient is a 65yo male admitted for ischemic lower extremity to be initiated on Heparin drip. Patient was appears to have been taking apixaban 5mg  bid prior to admission but patient is unsure when last dose was.   Goal of Therapy:  Heparin level 0.3-0.7 units/ml aPTT 68-109 seconds Monitor platelets by anticoagulation protocol: Yes   Plan:  Start heparin infusion at 1100 units/hr Continue to monitor H&H and platelets  Have  ordered a baseline heparin level since patient may have been taking apixaban prior to admission. Will check heparin level as well as an aPTT in 6hr as anticipate that heparin level will be elevated due to apixaban.  1/27:  HL @ 20:00 = 2.38           APTT @ 20:00 = 60  Will increase drip rate to 1200 units/hr and recheck aPTT 6 hrs after rate change.  Will recheck HL on 1/28 with AM labs.   Paulina Fusi, PharmD, BCPS 09/14/2015 9:30 PM

## 2015-09-14 NOTE — Plan of Care (Signed)
Problem: Tissue Perfusion: Goal: Risk factors for ineffective tissue perfusion will decrease Outcome: Not Progressing Pt presents with poor perfusion, especially to [name] with a vascular wound on the foot and necrotic toes.

## 2015-09-15 DIAGNOSIS — I70244 Atherosclerosis of native arteries of left leg with ulceration of heel and midfoot: Secondary | ICD-10-CM

## 2015-09-15 LAB — MAGNESIUM: MAGNESIUM: 2.1 mg/dL (ref 1.7–2.4)

## 2015-09-15 LAB — BASIC METABOLIC PANEL
Anion gap: 8 (ref 5–15)
BUN: 18 mg/dL (ref 6–20)
CALCIUM: 8.4 mg/dL — AB (ref 8.9–10.3)
CO2: 22 mmol/L (ref 22–32)
CREATININE: 1.32 mg/dL — AB (ref 0.61–1.24)
Chloride: 107 mmol/L (ref 101–111)
GFR calc Af Amer: >60 mL/min (ref >60)
GFR, EST NON AFRICAN AMERICAN: 55 mL/min — AB (ref >60)
GLUCOSE: 102 mg/dL — AB (ref 65–99)
Potassium: 4 mmol/L (ref 3.5–5.1)
Sodium: 137 mmol/L (ref 135–145)

## 2015-09-15 LAB — CBC
HCT: 31.2 % — ABNORMAL LOW (ref 40.0–52.0)
Hemoglobin: 10.1 g/dL — ABNORMAL LOW (ref 13.0–18.0)
MCH: 24 pg — AB (ref 26.0–34.0)
MCHC: 32.5 g/dL (ref 32.0–36.0)
MCV: 73.7 fL — ABNORMAL LOW (ref 80.0–100.0)
PLATELETS: 394 10*3/uL (ref 150–440)
RBC: 4.23 MIL/uL — ABNORMAL LOW (ref 4.40–5.90)
RDW: 16.1 % — AB (ref 11.5–14.5)
WBC: 16.3 10*3/uL — ABNORMAL HIGH (ref 3.8–10.6)

## 2015-09-15 LAB — APTT
APTT: 87 s — AB (ref 24–36)
APTT: 68 s — AB (ref 24–36)

## 2015-09-15 LAB — HEPARIN LEVEL (UNFRACTIONATED): HEPARIN UNFRACTIONATED: 1.79 [IU]/mL — AB (ref 0.30–0.70)

## 2015-09-15 MED ORDER — ENSURE ENLIVE PO LIQD
237.0000 mL | Freq: Two times a day (BID) | ORAL | Status: DC
  Administered 2015-09-15 – 2015-09-24 (×13): 237 mL via ORAL

## 2015-09-15 MED ORDER — METOPROLOL TARTRATE 25 MG PO TABS
12.5000 mg | ORAL_TABLET | Freq: Two times a day (BID) | ORAL | Status: DC
  Administered 2015-09-15 – 2015-09-24 (×18): 12.5 mg via ORAL
  Filled 2015-09-15 (×18): qty 1

## 2015-09-15 NOTE — Progress Notes (Signed)
ANTICOAGULATION CONSULT NOTE - Initial Consult  Pharmacy Consult for Heparin drip Indication: Ischemic lower extremity  No Known Allergies  Patient Measurements: Height: 6\' 1"  (185.4 cm) Weight: 199 lb (90.266 kg) IBW/kg (Calculated) : 79.9 Heparin Dosing Weight: 90.7 kg  Vital Signs: Temp: 98 F (36.7 C) (01/28 1232) Temp Source: Oral (01/28 1232) BP: 137/55 mmHg (01/28 1232) Pulse Rate: 87 (01/28 1232)  Labs:  Recent Labs  09/14/15 1206 09/14/15 2034 09/15/15 0642 09/15/15 1257  HGB 11.3*  --  10.1*  --   HCT 36.4*  --  31.2*  --   PLT 443*  --  394  --   APTT 38* 60* 87* 68*  LABPROT 17.1*  --   --   --   INR 1.38  --   --   --   HEPARINUNFRC 3.14* 2.38* 1.79*  --   CREATININE 1.34*  --  1.32*  --     Estimated Creatinine Clearance: 63.9 mL/min (by C-G formula based on Cr of 1.32).   Medical History: Past Medical History  Diagnosis Date  . Hypertension   . Peripheral vascular disease ([place])   . CHF (congestive heart failure) ([place])   . COPD (chronic obstructive pulmonary disease) ([place])   . Hypokalemia   . Insomnia   . Pressure ulcer     Medications:  Prescriptions prior to admission  Medication Sig Dispense Refill Last Dose  . amoxicillin-clavulanate (AUGMENTIN) 875-125 MG tablet Take 1 tablet by mouth 2 (two) times daily.   09/13/2015 at Unknown time  . apixaban (ELIQUIS) 2.5 MG TABS tablet Take 2.5 mg by mouth 2 (two) times daily.   09/13/2015 at Unknown time  . acetaminophen (TYLENOL) 325 MG tablet Take 650 mg by mouth at bedtime.    08/21/2015 at Unknown time  . baclofen (LIORESAL) 10 MG tablet Take 5 mg by mouth at bedtime. Reported on 09/14/2015   08/21/2015 at Unknown time  . benzocaine (AMERICAINE) 20 % oral spray Use as directed 1 application in the mouth or throat 4 (four) times daily as needed for throat irritation / pain. Reported on 09/14/2015   Not Taking  . benzocaine (AMERICAINE) 20 % rectal ointment Place rectally every 3 (three) hours as needed  for pain. Reported on 09/14/2015   Not Taking at Unknown time  . clopidogrel (PLAVIX) 75 MG tablet Take 75 mg by mouth daily.   08/22/2015 at Unknown time  . Fluticasone-Salmeterol (ADVAIR) 250-50 MCG/DOSE AEPB Inhale 1 puff into the lungs 2 (two) times daily.   08/22/2015 at Unknown time  . furosemide (LASIX) 40 MG tablet Take 40 mg by mouth 2 (two) times daily.    08/22/2015 at Unknown time  . gabapentin (NEURONTIN) 100 MG capsule Take 100 mg by mouth at bedtime.   08/21/2015 at Unknown time  . guaiFENesin (ROBITUSSIN) 100 MG/5ML liquid Take 100 mg by mouth every 6 (six) hours as needed for cough.   08/21/2015 at Unknown time  . Omega-3 Fatty Acids (FISH OIL) 1000 MG CAPS Take 1,000 mg by mouth every morning.   08/22/2015 at Unknown time  . oxycodone (OXY-IR) 5 MG capsule Take 5 mg by mouth every 4 (four) hours as needed.   07/30/2015 at Unknown time  . oxyCODONE-acetaminophen (PERCOCET) 10-325 MG tablet Take 2 tablets by mouth every 6 (six) hours as needed for pain.   08/21/2015 at Unknown time  . potassium chloride (K-DUR) 10 MEQ tablet Take 20 mEq by mouth daily.    08/22/2015 at Unknown  time  . sodium chloride (OCEAN) 0.65 % SOLN nasal spray Place 1 spray into both nostrils as needed for congestion.   07/29/2015 at Unknown time  . temazepam (RESTORIL) 15 MG capsule Take 15 mg by mouth at bedtime as needed for sleep.   08/21/2015 at Unknown time  . tiotropium (SPIRIVA) 18 MCG inhalation capsule Place 18 mcg into inhaler and inhale daily.   08/22/2015 at Unknown time   Infusions:  . sodium chloride 75 mL/hr at 09/15/15 0303  . heparin 1,200 Units/hr (09/15/15 1335)    Assessment: Patient is a 65yo male admitted for ischemic lower extremity to be initiated on Heparin drip. Patient was appears to have been taking apixaban 5mg  bid prior to admission but patient is unsure when last dose was.   Goal of Therapy:  Heparin level 0.3-0.7 units/ml aPTT 68-109 seconds Monitor platelets by anticoagulation protocol:  Yes   Plan:  APTT level remains therapeutic although at the low end of goal range and dropping. Will increase heparin drip by 1 unit/kg/hr to 1300 units/hr and check HL, aPTT, and CBC with AM labs.   Ulice Dash, PharmD Clinical Pharmacist   09/15/2015 2:25 PM

## 2015-09-15 NOTE — Progress Notes (Signed)
    Subjective  - HD#2  Complaining of left foot pain   Physical Exam:  Dressing remains intact her left foot Pedal pulses not palpable Respirations nonlabored   Assessment/Plan:    Appreciate assistance from podiatry and internal medicine. Continue IV antibiotics Discussed the possibility of below knee amputation.  Dr. Lucky Cowboy will further discuss this on Monday  [name] 09/15/2015 5:44 PM --  Filed Vitals:   09/15/15 0520 09/15/15 1232  BP: 129/56 137/55  Pulse: 85 87  Temp: 98.4 F (36.9 C) 98 F (36.7 C)  Resp: 16     Intake/Output Summary (Last 24 hours) at 09/15/15 1744 Last data filed at 09/15/15 1648  Gross per 24 hour  Intake 2810.73 ml  Output   1900 ml  Net 910.73 ml     Laboratory CBC    Component Value Date/Time   WBC 16.3* 09/15/2015 0642   WBC 11.1* 12/01/2013 1317   HGB 10.1* 09/15/2015 0642   HGB 14.3 12/01/2013 1317   HCT 31.2* 09/15/2015 0642   HCT 43.9 12/01/2013 1317   PLT 394 09/15/2015 0642   PLT 250 12/01/2013 1317    BMET    Component Value Date/Time   NA 137 09/15/2015 0642   NA 136 12/01/2013 1317   K 4.0 09/15/2015 0642   K 3.9 12/01/2013 1317   CL 107 09/15/2015 0642   CL 100 12/01/2013 1317   CO2 22 09/15/2015 0642   CO2 33* 12/01/2013 1317   GLUCOSE 102* 09/15/2015 0642   GLUCOSE 91 12/01/2013 1317   BUN 18 09/15/2015 0642   BUN 24* 09/25/2014 0846   CREATININE 1.32* 09/15/2015 0642   CREATININE 1.29 09/25/2014 0846   CALCIUM 8.4* 09/15/2015 0642   CALCIUM 9.4 12/01/2013 1317   GFRNONAA 55* 09/15/2015 0642   GFRNONAA 60* 09/25/2014 0846   GFRNONAA >60 03/16/2014 0750   GFRAA >60 09/15/2015 0642   GFRAA >60 09/25/2014 0846   GFRAA >60 03/16/2014 0750    [facility] Lab Results  Component Value Date   INR 1.38 09/14/2015   No results found for: PTT  Antibiotics Anti-infectives    Start     Dose/Rate Route Frequency Ordered Stop   09/14/15 1400  piperacillin-tazobactam (ZOSYN) IVPB 3.375 g     3.375  g 12.5 mL/hr over 240 Minutes Intravenous 3 times per day 09/14/15 1218     09/14/15 1200  piperacillin-tazobactam (ZOSYN) IVPB 3.375 g  Status:  Discontinued     3.375 g 12.5 mL/hr over 240 Minutes Intravenous 4 times per day 09/14/15 1145 09/14/15 1218       V. Leia Alf, M.D. [facility] Office: [phone] Pager:  [phone]

## 2015-09-15 NOTE — Consult Note (Signed)
Reason for Consult: No chief complaint on file.  Referring Physician: dr.Dew  Timothy Juarez is an 65 y.o. male.  HPI: The past medical history of severe peripheral arterial disease was admitted to vascular surgery service for left lower extent the pain and nonhealing ulcers. Hospitalist team is consulted for medical management of hypertension, CHF and COPD. During my examination patient is resting comfortably and watching TV. Denies any chest pain or shortness of breath. Could not recall his home medication list  Past Medical History  Diagnosis Date  . Hypertension   . Peripheral vascular disease ([facility])   . CHF (congestive heart failure) ([facility])   . COPD (chronic obstructive pulmonary disease) ([facility])   . Hypokalemia   . Insomnia   . Pressure ulcer     Past Surgical History  Procedure Laterality Date  . Peripheral vascular catheterization Left 01/18/2015    Procedure: Lower Extremity Angiography;  Surgeon: Annice Needy, MD;  Location: [facility];  Service: Cardiovascular;  Laterality: Left;  . Peripheral vascular catheterization N/A 01/18/2015    Procedure: Lower Extremity Intervention;  Surgeon: Annice Needy, MD;  Location: [facility];  Service: Cardiovascular;  Laterality: N/A;  . Peripheral vascular catheterization  07/30/2015    Procedure: Lower Extremity Intervention;  Surgeon: Annice Needy, MD;  Location: [facility];  Service: Cardiovascular;;  . Peripheral vascular catheterization N/A 07/30/2015    Procedure: Abdominal Aortogram w/Lower Extremity;  Surgeon: Annice Needy, MD;  Location: [facility];  Service: Cardiovascular;  Laterality: N/A;  . Peripheral vascular catheterization Left 08/22/2015    Procedure: Lower Extremity Angiography;  Surgeon: Annice Needy, MD;  Location: [facility];  Service: Cardiovascular;  Laterality: Left;  . Peripheral vascular catheterization Left 08/22/2015    Procedure: Lower Extremity Intervention;  Surgeon:  Annice Needy, MD;  Location: [facility];  Service: Cardiovascular;  Laterality: Left;    Family history-hypertension and heart condition runs  in his family Social History:  reports that he quit smoking about 2 years ago. His smoking use included Cigarettes. He has a 44 pack-year smoking history. He does not have any smokeless tobacco history on file. He reports that he does not drink alcohol or use illicit drugs.  Allergies: No Known Allergies  Medications: I have reviewed the patient's current medications.  Results for orders placed or performed during the hospital encounter of 09/14/15 (from the past 48 hour(s))  APTT     Status: Abnormal   Collection Time: 09/14/15 12:06 PM  Result Value Ref Range   aPTT 38 (H) 24 - 36 seconds    Comment:        IF BASELINE aPTT IS ELEVATED, SUGGEST PATIENT RISK ASSESSMENT BE USED TO DETERMINE APPROPRIATE ANTICOAGULANT THERAPY.   Basic metabolic panel     Status: Abnormal   Collection Time: 09/14/15 12:06 PM  Result Value Ref Range   Sodium 140 135 - 145 mmol/L   Potassium 4.1 3.5 - 5.1 mmol/L   Chloride 105 101 - 111 mmol/L   CO2 24 22 - 32 mmol/L   Glucose, Bld 122 (H) 65 - 99 mg/dL   BUN 20 6 - 20 mg/dL   Creatinine, Ser 1.55 (H) 0.61 - 1.24 mg/dL   Calcium 9.3 8.9 - 20.8 mg/dL   GFR calc non Af Amer 54 (L) >60 mL/min   GFR calc Af Amer >60 >60 mL/min    Comment: (NOTE) The eGFR has been  calculated using the CKD EPI equation. This calculation has not been validated in all clinical situations. eGFR's persistently <60 mL/min signify possible Chronic Kidney Disease.    Anion gap 11 5 - 15  CBC     Status: Abnormal   Collection Time: 09/14/15 12:06 PM  Result Value Ref Range   WBC 21.1 (H) 3.8 - 10.6 K/uL   RBC 4.95 4.40 - 5.90 MIL/uL   Hemoglobin 11.3 (L) 13.0 - 18.0 g/dL   HCT 36.4 (L) 40.0 - 52.0 %   MCV 73.5 (L) 80.0 - 100.0 fL   MCH 22.9 (L) 26.0 - 34.0 pg   MCHC 31.2 (L) 32.0 - 36.0 g/dL   RDW 16.0 (H) 11.5 - 14.5  %   Platelets 443 (H) 150 - 440 K/uL  Magnesium     Status: None   Collection Time: 09/14/15 12:06 PM  Result Value Ref Range   Magnesium 2.3 1.7 - 2.4 mg/dL  Protime-INR     Status: Abnormal   Collection Time: 09/14/15 12:06 PM  Result Value Ref Range   Prothrombin Time 17.1 (H) 11.4 - 15.0 seconds   INR 1.38   Type and screen     Status: None   Collection Time: 09/14/15 12:06 PM  Result Value Ref Range   ABO/RH(D) A POS    Antibody Screen NEG    Sample Expiration 09/17/2015   Heparin level (unfractionated)     Status: Abnormal   Collection Time: 09/14/15 12:06 PM  Result Value Ref Range   Heparin Unfractionated 3.14 (H) 0.30 - 0.70 IU/mL    Comment:        IF HEPARIN RESULTS ARE BELOW EXPECTED VALUES, AND PATIENT DOSAGE HAS BEEN CONFIRMED, SUGGEST FOLLOW UP TESTING OF ANTITHROMBIN III LEVELS. RESULTS CONFIRMED BY MANUAL DILUTION   ABO/Rh     Status: None   Collection Time: 09/14/15 12:06 PM  Result Value Ref Range   ABO/RH(D) A POS   Heparin level (unfractionated)     Status: Abnormal   Collection Time: 09/14/15  8:34 PM  Result Value Ref Range   Heparin Unfractionated 2.38 (H) 0.30 - 0.70 IU/mL    Comment: RESULTS CONFIRMED BY MANUAL DILUTION        IF HEPARIN RESULTS ARE BELOW EXPECTED VALUES, AND PATIENT DOSAGE HAS BEEN CONFIRMED, SUGGEST FOLLOW UP TESTING OF ANTITHROMBIN III LEVELS.   APTT     Status: Abnormal   Collection Time: 09/14/15  8:34 PM  Result Value Ref Range   aPTT 60 (H) 24 - 36 seconds    Comment:        IF BASELINE aPTT IS ELEVATED, SUGGEST PATIENT RISK ASSESSMENT BE USED TO DETERMINE APPROPRIATE ANTICOAGULANT THERAPY.   Basic metabolic panel     Status: Abnormal   Collection Time: 09/15/15  6:42 AM  Result Value Ref Range   Sodium 137 135 - 145 mmol/L   Potassium 4.0 3.5 - 5.1 mmol/L   Chloride 107 101 - 111 mmol/L   CO2 22 22 - 32 mmol/L   Glucose, Bld 102 (H) 65 - 99 mg/dL   BUN 18 6 - 20 mg/dL   Creatinine, Ser 1.32 (H) 0.61 -  1.24 mg/dL   Calcium 8.4 (L) 8.9 - 10.3 mg/dL   GFR calc non Af Amer 55 (L) >60 mL/min   GFR calc Af Amer >60 >60 mL/min    Comment: (NOTE) The eGFR has been calculated using the CKD EPI equation. This calculation has not been validated in all clinical  situations. eGFR's persistently <60 mL/min signify possible Chronic Kidney Disease.    Anion gap 8 5 - 15  CBC     Status: Abnormal   Collection Time: 09/15/15  6:42 AM  Result Value Ref Range   WBC 16.3 (H) 3.8 - 10.6 K/uL   RBC 4.23 (L) 4.40 - 5.90 MIL/uL   Hemoglobin 10.1 (L) 13.0 - 18.0 g/dL   HCT 31.2 (L) 40.0 - 52.0 %   MCV 73.7 (L) 80.0 - 100.0 fL   MCH 24.0 (L) 26.0 - 34.0 pg   MCHC 32.5 32.0 - 36.0 g/dL   RDW 16.1 (H) 11.5 - 14.5 %   Platelets 394 150 - 440 K/uL  Magnesium     Status: None   Collection Time: 09/15/15  6:42 AM  Result Value Ref Range   Magnesium 2.1 1.7 - 2.4 mg/dL  Heparin level (unfractionated)     Status: Abnormal   Collection Time: 09/15/15  6:42 AM  Result Value Ref Range   Heparin Unfractionated 1.79 (H) 0.30 - 0.70 IU/mL    Comment:        IF HEPARIN RESULTS ARE BELOW EXPECTED VALUES, AND PATIENT DOSAGE HAS BEEN CONFIRMED, SUGGEST FOLLOW UP TESTING OF ANTITHROMBIN III LEVELS.   APTT     Status: Abnormal   Collection Time: 09/15/15  6:42 AM  Result Value Ref Range   aPTT 87 (H) 24 - 36 seconds    Comment:        IF BASELINE aPTT IS ELEVATED, SUGGEST PATIENT RISK ASSESSMENT BE USED TO DETERMINE APPROPRIATE ANTICOAGULANT THERAPY.   APTT     Status: Abnormal   Collection Time: 09/15/15 12:57 PM  Result Value Ref Range   aPTT 68 (H) 24 - 36 seconds    Comment:        IF BASELINE aPTT IS ELEVATED, SUGGEST PATIENT RISK ASSESSMENT BE USED TO DETERMINE APPROPRIATE ANTICOAGULANT THERAPY.     No results found.  ROS:  CONSTITUTIONAL: Denies fevers, chills. Denies any fatigue, weakness.  EYES: Denies blurry vision, double vision, eye pain. EARS, NOSE, THROAT: Denies tinnitus, ear pain,  hearing loss. RESPIRATORY: Denies cough, wheeze, shortness of breath.  CARDIOVASCULAR: Denies chest pain, palpitations, edema.  GASTROINTESTINAL: Denies nausea, vomiting, diarrhea, abdominal pain. Denies bright red blood per rectum. GENITOURINARY: Denies dysuria, hematuria. ENDOCRINE: Denies nocturia or thyroid problems. HEMATOLOGIC AND LYMPHATIC: Denies easy bruising or bleeding. SKIN: Reports nonhealing ulcers on the left lower extremity MUSCULOSKELETAL: Denies pain in neck, back, shoulder, knees, hips or arthritic symptoms.  NEUROLOGIC: Denies paralysis, paresthesias.  PSYCHIATRIC: Denies anxiety or depressive symptoms. Blood pressure 137/55, pulse 87, temperature 98 F (36.7 C), temperature source Oral, resp. rate 16, height '6\' 1"'$  (1.854 m), weight 90.266 kg (199 lb), SpO2 94 %.   PHYSICAL EXAMINATION:  GENERAL: Well-nourished, well-developed currently in no acute distress.  HEAD: Normocephalic, atraumatic.  EYES: Pupils equal, round, and reactive to light. Extraocular muscles intact. No scleral icterus.  MOUTH: Moist mucosal membranes. Dentition intact. No abscess noted. EARS, NOSE, THROAT: Clear without exudates. No external lesions.  NECK: Supple. No thyromegaly. No nodules. No JVD.  PULMONARY: Clear to auscultation bilaterally without wheezes, rales, or rhonchi. No use of accessory muscles. Good respiratory effort. CHEST: Nontender to palpation.  CARDIOVASCULAR: S1, S2, regular rate and rhythm. No murmurs, rubs, or gallops.  GASTROINTESTINAL: Soft, nontender, nondistended. No masses. Positive bowel sounds. No hepatosplenomegaly. MUSCULOSKELETAL: No swelling, clubbing, edema. Range of motion full in all extremities. NEUROLOGIC: Cranial nerves II-XII intact. No gross  focal neurological deficits. Sensation intact. Reflexes intact. SKIN: Left lower extremity with necrotic ulcers on the great toe, fourth and fifth toes, some discharge is also noticed.. Turgor intact. PSYCHIATRIC:  Mood, affect within normal limits. Patient awake, alert, oriented x 3. Insight and judgment intact.   Assessment/Plan: 1. Essential hypertension- Currently blood pressure is stable. Continue Lasix and will add small dose beta blocker for cardioprotective action  2. Chronic history of congestive heart failure Currently not fluid overloaded, patient on IV fluids normal saline at 75 cc/h Continue Lasix 40 mg by mouth twice a day and aspirin Monitor daily weights, intake and output Add small dose beta blocker  3. Chronic history of COPD with past medical history of smoking No COPD exacerbation at this time. Continue Dulera and Spiriva  4. Nonhealing left lower extremity ulcers with peripheral artery disease Management by vascular and podiatry Continue heparin drip and IV antibiotic Zosyn  GI prophylaxis with Protonix DVT prophylaxis-patient is on heparin drip  Thank you Dr. Lucky Cowboy  for allowing hospitalist team to be part  of  patient's medical management    TOTAL TIME TAKING CARE OF THIS PATIENT: 41 minutes.  '@[facility]'$ @ Pager - [phone] 09/15/2015, 3:11 PM

## 2015-09-15 NOTE — Progress Notes (Signed)
Initial Nutrition Assessment  INTERVENTION:   Meals and Snacks: Cater to patient preferences Medical Food Supplement Therapy: pt likes Ensure; recommend Ensure Enlive po BID between meals, each supplement provides 350 kcal and 20 grams of protein to maximize nutrition intake due to possible wt loss, increased needs due to wound healing   NUTRITION DIAGNOSIS:   Increased nutrient needs related to acute illness, wound healing as evidenced by estimated needs.  GOAL:   Patient will meet greater than or equal to 90% of their needs   MONITOR:    (Energy Intake, Anthropometrics, Electrolyte/Renal Profile, Digestive system)  REASON FOR ASSESSMENT:    (pressure ulcer)    ASSESSMENT:    Pt admitted with Left LE pain and non-healing ulcerations with hx of severe PAD; pt with hx of multiple vascular interventions on bilateral LE. Pt admitted for IV abx to prevent infection/sepsis in setting of limb ischemia with gangrene, possible need for surgical intervention  Past Medical History  Diagnosis Date  . Hypertension   . Peripheral vascular disease ([place])   . CHF (congestive heart failure) ([place])   . COPD (chronic obstructive pulmonary disease) ([place])   . Hypokalemia   . Insomnia   . Pressure ulcer      Diet Order:  Diet Heart Room service appropriate?: Yes; Fluid consistency:: Thin   Energy Intake: pt ate 100% at breakfast this AM; pt reports his appetite depends on whether or not he is in pain, if pain free he has a great appetite  Food and nutrition related history: pt reports appetite at home as been great, eating well  Electrolyte and Renal Profile:  Recent Labs Lab 09/14/15 1206 09/15/15 0642  BUN 20 18  CREATININE 1.34* 1.32*  NA 140 137  K 4.1 4.0  MG 2.3 2.1   Glucose Profile: No results for input(s): GLUCAP in the last 72 hours. Nutritional Anemia Profile:  CBC Latest Ref Rng 09/15/2015 09/14/2015 12/01/2013  WBC 3.8 - 10.6 K/uL 16.3(H) 21.1(H) 11.1(H)  Hemoglobin  13.0 - 18.0 g/dL 10.1(L) 11.3(L) 14.3  Hematocrit 40.0 - 52.0 % 31.2(L) 36.4(L) 43.9  Platelets 150 - 440 K/uL 394 443(H) 250    Nutrition Focused Physical Exam:  Unable to complete Nutrition-Focused physical exam at this time. Pt in a lot of pain on visit today, waiting on pain medication; deferred physical exam  Meds: NS at 75 ml/hr, lasix, potassium chloride  Height:   Ht Readings from Last 1 Encounters:  09/14/15 6\' 1"  (1.854 m)    Weight: pt did not know if he has lost or gained any weight recently; per weight encounters, pt with 10.4% wt loss in <1 month  Wt Readings from Last 1 Encounters:  09/15/15 199 lb (90.266 kg)    Wt Readings from Last 10 Encounters:  09/15/15 199 lb (90.266 kg)  08/22/15 222 lb (100.699 kg)  07/30/15 227 lb (102.967 kg)  01/18/15 218 lb (98.884 kg    BMI:  Body mass index is 26.26 kg/(m^2).  Estimated Nutritional Needs:   Kcal:  [id number] kcals (BEE 1724, 1.3 AF, 1.1-1.3 IF)   Protein:  109-127 g (1.2-1.4 g/kg)   Fluid:  2275-2730 mL (25-30 ml/kg)     MODERATE Care Level  Kerman Passey MS, RD, LDN [phone] Pager  [phone] Weekend/On-Call Pager

## 2015-09-15 NOTE — Progress Notes (Signed)
ANTICOAGULATION CONSULT NOTE - Initial Consult  Pharmacy Consult for Heparin drip Indication: Ischemic lower extremity  No Known Allergies  Patient Measurements: Height: 6\' 1"  (185.4 cm) Weight: 199 lb (90.266 kg) IBW/kg (Calculated) : 79.9 Heparin Dosing Weight: 90.7 kg  Vital Signs: Temp: 98.4 F (36.9 C) (01/28 0520) Temp Source: Oral (01/28 0520) BP: 129/56 mmHg (01/28 0520) Pulse Rate: 85 (01/28 0520)  Labs:  Recent Labs  09/14/15 1206 09/14/15 2034 09/15/15 0642  HGB 11.3*  --  10.1*  HCT 36.4*  --  31.2*  PLT 443*  --  394  APTT 38* 60* 87*  LABPROT 17.1*  --   --   INR 1.38  --   --   HEPARINUNFRC 3.14* 2.38* 1.79*  CREATININE 1.34*  --  1.32*    Estimated Creatinine Clearance: 63.9 mL/min (by C-G formula based on Cr of 1.32).   Medical History: Past Medical History  Diagnosis Date  . Hypertension   . Peripheral vascular disease ([place])   . CHF (congestive heart failure) ([place])   . COPD (chronic obstructive pulmonary disease) ([place])   . Hypokalemia   . Insomnia   . Pressure ulcer     Medications:  Prescriptions prior to admission  Medication Sig Dispense Refill Last Dose  . amoxicillin-clavulanate (AUGMENTIN) 875-125 MG tablet Take 1 tablet by mouth 2 (two) times daily.   09/13/2015 at Unknown time  . apixaban (ELIQUIS) 2.5 MG TABS tablet Take 2.5 mg by mouth 2 (two) times daily.   09/13/2015 at Unknown time  . acetaminophen (TYLENOL) 325 MG tablet Take 650 mg by mouth at bedtime.    08/21/2015 at Unknown time  . baclofen (LIORESAL) 10 MG tablet Take 5 mg by mouth at bedtime. Reported on 09/14/2015   08/21/2015 at Unknown time  . benzocaine (AMERICAINE) 20 % oral spray Use as directed 1 application in the mouth or throat 4 (four) times daily as needed for throat irritation / pain. Reported on 09/14/2015   Not Taking  . benzocaine (AMERICAINE) 20 % rectal ointment Place rectally every 3 (three) hours as needed for pain. Reported on 09/14/2015   Not Taking at  Unknown time  . clopidogrel (PLAVIX) 75 MG tablet Take 75 mg by mouth daily.   08/22/2015 at Unknown time  . Fluticasone-Salmeterol (ADVAIR) 250-50 MCG/DOSE AEPB Inhale 1 puff into the lungs 2 (two) times daily.   08/22/2015 at Unknown time  . furosemide (LASIX) 40 MG tablet Take 40 mg by mouth 2 (two) times daily.    08/22/2015 at Unknown time  . gabapentin (NEURONTIN) 100 MG capsule Take 100 mg by mouth at bedtime.   08/21/2015 at Unknown time  . guaiFENesin (ROBITUSSIN) 100 MG/5ML liquid Take 100 mg by mouth every 6 (six) hours as needed for cough.   08/21/2015 at Unknown time  . Omega-3 Fatty Acids (FISH OIL) 1000 MG CAPS Take 1,000 mg by mouth every morning.   08/22/2015 at Unknown time  . oxycodone (OXY-IR) 5 MG capsule Take 5 mg by mouth every 4 (four) hours as needed.   07/30/2015 at Unknown time  . oxyCODONE-acetaminophen (PERCOCET) 10-325 MG tablet Take 2 tablets by mouth every 6 (six) hours as needed for pain.   08/21/2015 at Unknown time  . potassium chloride (K-DUR) 10 MEQ tablet Take 20 mEq by mouth daily.    08/22/2015 at Unknown time  . sodium chloride (OCEAN) 0.65 % SOLN nasal spray Place 1 spray into both nostrils as needed for congestion.   07/29/2015  at Unknown time  . temazepam (RESTORIL) 15 MG capsule Take 15 mg by mouth at bedtime as needed for sleep.   08/21/2015 at Unknown time  . tiotropium (SPIRIVA) 18 MCG inhalation capsule Place 18 mcg into inhaler and inhale daily.   08/22/2015 at Unknown time   Infusions:  . sodium chloride 75 mL/hr at 09/15/15 0303  . heparin 1,200 Units/hr (09/15/15 0303)    Assessment: Patient is a 65yo male admitted for ischemic lower extremity to be initiated on Heparin drip. Patient was appears to have been taking apixaban 5mg  bid prior to admission but patient is unsure when last dose was.   Goal of Therapy:  Heparin level 0.3-0.7 units/ml aPTT 68-109 seconds Monitor platelets by anticoagulation protocol: Yes   Plan:  Heparin level remains elevated but  APTT at goal. Will continue heparin infusion at current rate of 1200 units/hr and check a confirmatory APTT in 6 hours. For now, will plan on checking another HL with AM labs.   Ulice Dash, PharmD Clinical Pharmacist   09/15/2015 7:52 AM

## 2015-09-15 NOTE — Consult Note (Signed)
Patient Demographics  Sotirios Radden, is a 65 y.o. male   MRN: [id number]   DOB - Aug 08, 1951  Admit Date - 09/14/2015    Outpatient Primary MD for the patient is Marisa Hua, MD  Consult requested in the Hospital by Algernon Huxley, MD, On 09/15/2015    Reason for consult assess open wounds and ischemic changes to the left foot toes and heel.   With History of -  Past Medical History  Diagnosis Date  . Hypertension   . Peripheral vascular disease ([place])   . CHF (congestive heart failure) ([place])   . COPD (chronic obstructive pulmonary disease) ([place])   . Hypokalemia   . Insomnia   . Pressure ulcer       Past Surgical History  Procedure Laterality Date  . Peripheral vascular catheterization Left 01/18/2015    Procedure: Lower Extremity Angiography;  Surgeon: Algernon Huxley, MD;  Location: [facility];  Service: Cardiovascular;  Laterality: Left;  . Peripheral vascular catheterization N/A 01/18/2015    Procedure: Lower Extremity Intervention;  Surgeon: Algernon Huxley, MD;  Location: [facility];  Service: Cardiovascular;  Laterality: N/A;  . Peripheral vascular catheterization  07/30/2015    Procedure: Lower Extremity Intervention;  Surgeon: Algernon Huxley, MD;  Location: [facility];  Service: Cardiovascular;;  . Peripheral vascular catheterization N/A 07/30/2015    Procedure: Abdominal Aortogram w/Lower Extremity;  Surgeon: Algernon Huxley, MD;  Location: [facility];  Service: Cardiovascular;  Laterality: N/A;  . Peripheral vascular catheterization Left 08/22/2015    Procedure: Lower Extremity Angiography;  Surgeon: Algernon Huxley, MD;  Location: [facility];  Service: Cardiovascular;  Laterality: Left;  . Peripheral vascular catheterization Left 08/22/2015    Procedure: Lower  Extremity Intervention;  Surgeon: Algernon Huxley, MD;  Location: [facility];  Service: Cardiovascular;  Laterality: Left;    in for   No chief complaint on file.    HPI  Marcello Kaczka  is a 65 y.o. male, chief complaint of chronic pain and open wounds and ulceration drainage to his left foot. He's had attempts at vascular reconstruction i, but he continues to show back down with these vessels and have pain and tissue damage. Today he currently complains of open wounds and severe pain to his left foot.   Social History Social History  Substance Use Topics  . Smoking status: Former Smoker -- 1.00 packs/day for 44 years    Types: Cigarettes    Quit date: 11/17/2012  . Smokeless tobacco: Not on file  . Alcohol Use: No    Family History History reviewed. No pertinent family history.   Prior to Admission medications   Medication Sig Start Date End Date Taking? Authorizing Provider  amoxicillin-clavulanate (AUGMENTIN) 875-125 MG tablet Take 1 tablet by mouth 2 (two) times daily.   Yes Historical Provider, MD  apixaban (ELIQUIS) 2.5 MG TABS tablet Take 2.5 mg by mouth 2 (two) times daily.   Yes Historical Provider, MD  acetaminophen (TYLENOL) 325 MG tablet Take 650 mg by mouth at bedtime.     Historical Provider, MD  baclofen (LIORESAL) 10 MG tablet Take  5 mg by mouth at bedtime. Reported on 09/14/2015    Historical Provider, MD  benzocaine (AMERICAINE) 20 % oral spray Use as directed 1 application in the mouth or throat 4 (four) times daily as needed for throat irritation / pain. Reported on 09/14/2015    Historical Provider, MD  benzocaine (AMERICAINE) 20 % rectal ointment Place rectally every 3 (three) hours as needed for pain. Reported on 09/14/2015    Historical Provider, MD  clopidogrel (PLAVIX) 75 MG tablet Take 75 mg by mouth daily.    Historical Provider, MD  Fluticasone-Salmeterol (ADVAIR) 250-50 MCG/DOSE AEPB Inhale 1 puff into the lungs 2 (two) times daily.    Historical  Provider, MD  furosemide (LASIX) 40 MG tablet Take 40 mg by mouth 2 (two) times daily.     Historical Provider, MD  gabapentin (NEURONTIN) 100 MG capsule Take 100 mg by mouth at bedtime.    Historical Provider, MD  guaiFENesin (ROBITUSSIN) 100 MG/5ML liquid Take 100 mg by mouth every 6 (six) hours as needed for cough.    Historical Provider, MD  Omega-3 Fatty Acids (FISH OIL) 1000 MG CAPS Take 1,000 mg by mouth every morning.    Historical Provider, MD  oxycodone (OXY-IR) 5 MG capsule Take 5 mg by mouth every 4 (four) hours as needed.    Historical Provider, MD  oxyCODONE-acetaminophen (PERCOCET) 10-325 MG tablet Take 2 tablets by mouth every 6 (six) hours as needed for pain.    Historical Provider, MD  potassium chloride (K-DUR) 10 MEQ tablet Take 20 mEq by mouth daily.     Historical Provider, MD  sodium chloride (OCEAN) 0.65 % SOLN nasal spray Place 1 spray into both nostrils as needed for congestion.    Historical Provider, MD  temazepam (RESTORIL) 15 MG capsule Take 15 mg by mouth at bedtime as needed for sleep.    Historical Provider, MD  tiotropium (SPIRIVA) 18 MCG inhalation capsule Place 18 mcg into inhaler and inhale daily.    Historical Provider, MD    Anti-infectives    Start     Dose/Rate Route Frequency Ordered Stop   09/14/15 1400  piperacillin-tazobactam (ZOSYN) IVPB 3.375 g     3.375 g 12.5 mL/hr over 240 Minutes Intravenous 3 times per day 09/14/15 1218     09/14/15 1200  piperacillin-tazobactam (ZOSYN) IVPB 3.375 g  Status:  Discontinued     3.375 g 12.5 mL/hr over 240 Minutes Intravenous 4 times per day 09/14/15 1145 09/14/15 1218      Scheduled Meds: . aspirin EC  81 mg Oral Daily  . baclofen  5 mg Oral QHS  . docusate sodium  100 mg Oral BID  . feeding supplement (ENSURE ENLIVE)  237 mL Oral BID BM  . furosemide  40 mg Oral BID  . gabapentin  100 mg Oral QHS  . mometasone-formoterol  2 puff Inhalation BID  . pantoprazole  40 mg Oral Daily  .  piperacillin-tazobactam (ZOSYN)  IV  3.375 g Intravenous 3 times per day  . potassium chloride SA  20 mEq Oral Daily  . tiotropium  18 mcg Inhalation Daily   Continuous Infusions: . sodium chloride 75 mL/hr at 09/15/15 0303  . heparin 1,200 Units/hr (09/15/15 1335)   PRN Meds:.acetaminophen **OR** acetaminophen, alum & mag hydroxide-simeth, ondansetron **OR** ondansetron (ZOFRAN) IV, oxyCODONE-acetaminophen, oxyCODONE-acetaminophen, polyethylene glycol, temazepam  No Known Allergies  Physical Exam  Vitals  Blood pressure 137/55, pulse 87, temperature 98 F (36.7 C), temperature source Oral, resp. rate 16, height  6\' 1"  (1.854 m), weight 90.266 kg (199 lb), SpO2 94 %.  Lower Extremity exam:  Vascular: Nonpalpable bilaterally. Patient has had vascular intervention in the recent past by Dr. [name]. Was readmitted January 26 because of continued pain and ulceration to his left foot. Ulcers failed to improve.  Dermatological: dorsally on the left foot there is evidence of ischemia to the skin on the dorsum of the foot and also the toes. Drainage from several different areas with several small ulcers that appeared to penetrate down to deeper tissue. Posterior left heel has a full-thickness wound through the dermis , epidermis and fatty tissue and possibly down to the Achilles tendon area. Difficult to evaluate or probe these because they are exquisitely painful for the patient. Several toes. Have some gangrenous changes across the region.   Neurological: patient appears to be fully sensate  Ortho: uncertain as to level of damage with the Achilles tendon on the left heel. Appears to have minimal flow from midfoot distally. Tissues. Be very unhealthy likely extensive tissue damage involving skin and soft tissue structures to the distal two thirds of the foot.  Data Review  CBC  Recent Labs Lab 09/14/15 1206 09/15/15 0642  WBC 21.1* 16.3*  HGB 11.3* 10.1*  HCT 36.4* 31.2*  PLT 443*  394  MCV 73.5* 73.7*  MCH 22.9* 24.0*  MCHC 31.2* 32.5  RDW 16.0* 16.1*   ------------------------------------------------------------------------------------------------------------------  Chemistries   Recent Labs Lab 09/14/15 1206 09/15/15 0642  NA 140 137  K 4.1 4.0  CL 105 107  CO2 24 22  GLUCOSE 122* 102*  BUN 20 18  CREATININE 1.34* 1.32*  CALCIUM 9.3 8.4*  MG 2.3 2.1   ------ ---------------------------------------------------------------------------------------------------------------   Assessment & Plan:. Patient appears to have significant peripheral arterial disease and ischemia to the left foot. Noted skin and soft tissue damage to the left foot including some gangrenous changes to the dorsum of the foot and of the toes. Posterior full-thickness ulcer to the left posterior heel possibly involving the Achilles tendon. Significant ischemic pain to the left foot. Plan: Patient has had attempts at vascular reconstruction and he continues to have ischemia to the left foot resulting in significant tissue damage. No imaging studies are done but I would not encourage these unless surgical debridement or amputation is planned for the foot. I think the likelihood of any attempts at digital or transmetatarsal amputation with the futile.with his level of pain in the foot and unsuccessful revascularization a BKA would likely be his most reasonable resolution to his ischemic painful left lower extremity. If future attempts at vascular reconstruction are successful please let me know and I will be happy to reevaluate patient.   Active Problems:   Atherosclerotic peripheral vascular disease with ulceration (HCC)   Ischemia of lower extremity     Family Communication: Plan discussed with patient.   Perry Mount M.D on 09/15/2015 at 2:00 PM  Thank you for the consult, we will follow the patient with you in the Hospital.

## 2015-09-16 LAB — CBC
HCT: 31.9 % — ABNORMAL LOW (ref 40.0–52.0)
Hemoglobin: 9.8 g/dL — ABNORMAL LOW (ref 13.0–18.0)
MCH: 22.6 pg — ABNORMAL LOW (ref 26.0–34.0)
MCHC: 30.9 g/dL — ABNORMAL LOW (ref 32.0–36.0)
MCV: 73.3 fL — ABNORMAL LOW (ref 80.0–100.0)
PLATELETS: 408 10*3/uL (ref 150–440)
RBC: 4.35 MIL/uL — AB (ref 4.40–5.90)
RDW: 16.3 % — AB (ref 11.5–14.5)
WBC: 16.8 10*3/uL — AB (ref 3.8–10.6)

## 2015-09-16 LAB — HEPARIN LEVEL (UNFRACTIONATED): HEPARIN UNFRACTIONATED: 1.01 [IU]/mL — AB (ref 0.30–0.70)

## 2015-09-16 LAB — APTT: APTT: 78 s — AB (ref 24–36)

## 2015-09-16 MED ORDER — MORPHINE SULFATE (PF) 2 MG/ML IV SOLN
2.0000 mg | INTRAVENOUS | Status: DC | PRN
  Administered 2015-09-16 – 2015-09-19 (×12): 2 mg via INTRAVENOUS
  Filled 2015-09-16 (×12): qty 1

## 2015-09-16 MED ORDER — OXYCODONE-ACETAMINOPHEN 5-325 MG PO TABS
2.0000 | ORAL_TABLET | ORAL | Status: DC | PRN
  Administered 2015-09-16: 2 via ORAL
  Filled 2015-09-16: qty 2

## 2015-09-16 NOTE — Progress Notes (Signed)
ANTICOAGULATION CONSULT NOTE - Initial Consult  Pharmacy Consult for Heparin drip Indication: Ischemic lower extremity  No Known Allergies  Patient Measurements: Height: 6\' 1"  (185.4 cm) Weight: 201 lb 1.6 oz (91.218 kg) IBW/kg (Calculated) : 79.9 Heparin Dosing Weight: 90.7 kg  Vital Signs: Temp: 98.7 F (37.1 C) (01/29 0617) Temp Source: Oral (01/29 0617) BP: 104/53 mmHg (01/29 0617) Pulse Rate: 79 (01/29 0617)  Labs:  Recent Labs  09/14/15 1206 09/14/15 2034 09/15/15 0642 09/15/15 1257 09/16/15 0535  HGB 11.3*  --  10.1*  --  9.8*  HCT 36.4*  --  31.2*  --  31.9*  PLT 443*  --  394  --  408  APTT 38* 60* 87* 68* 78*  LABPROT 17.1*  --   --   --   --   INR 1.38  --   --   --   --   HEPARINUNFRC 3.14* 2.38* 1.79*  --  1.01*  CREATININE 1.34*  --  1.32*  --   --     Estimated Creatinine Clearance: 63.9 mL/min (by C-G formula based on Cr of 1.32).   Medical History: Past Medical History  Diagnosis Date  . Hypertension   . Peripheral vascular disease ([place])   . CHF (congestive heart failure) ([place])   . COPD (chronic obstructive pulmonary disease) ([place])   . Hypokalemia   . Insomnia   . Pressure ulcer     Medications:  Prescriptions prior to admission  Medication Sig Dispense Refill Last Dose  . amoxicillin-clavulanate (AUGMENTIN) 875-125 MG tablet Take 1 tablet by mouth 2 (two) times daily.   09/13/2015 at Unknown time  . apixaban (ELIQUIS) 2.5 MG TABS tablet Take 2.5 mg by mouth 2 (two) times daily.   09/13/2015 at Unknown time  . acetaminophen (TYLENOL) 325 MG tablet Take 650 mg by mouth at bedtime.    08/21/2015 at Unknown time  . baclofen (LIORESAL) 10 MG tablet Take 5 mg by mouth at bedtime. Reported on 09/14/2015   08/21/2015 at Unknown time  . benzocaine (AMERICAINE) 20 % oral spray Use as directed 1 application in the mouth or throat 4 (four) times daily as needed for throat irritation / pain. Reported on 09/14/2015   Not Taking  . benzocaine (AMERICAINE) 20 %  rectal ointment Place rectally every 3 (three) hours as needed for pain. Reported on 09/14/2015   Not Taking at Unknown time  . clopidogrel (PLAVIX) 75 MG tablet Take 75 mg by mouth daily.   08/22/2015 at Unknown time  . Fluticasone-Salmeterol (ADVAIR) 250-50 MCG/DOSE AEPB Inhale 1 puff into the lungs 2 (two) times daily.   08/22/2015 at Unknown time  . furosemide (LASIX) 40 MG tablet Take 40 mg by mouth 2 (two) times daily.    08/22/2015 at Unknown time  . gabapentin (NEURONTIN) 100 MG capsule Take 100 mg by mouth at bedtime.   08/21/2015 at Unknown time  . guaiFENesin (ROBITUSSIN) 100 MG/5ML liquid Take 100 mg by mouth every 6 (six) hours as needed for cough.   08/21/2015 at Unknown time  . Omega-3 Fatty Acids (FISH OIL) 1000 MG CAPS Take 1,000 mg by mouth every morning.   08/22/2015 at Unknown time  . oxycodone (OXY-IR) 5 MG capsule Take 5 mg by mouth every 4 (four) hours as needed.   07/30/2015 at Unknown time  . oxyCODONE-acetaminophen (PERCOCET) 10-325 MG tablet Take 2 tablets by mouth every 6 (six) hours as needed for pain.   08/21/2015 at Unknown time  .  potassium chloride (K-DUR) 10 MEQ tablet Take 20 mEq by mouth daily.    08/22/2015 at Unknown time  . sodium chloride (OCEAN) 0.65 % SOLN nasal spray Place 1 spray into both nostrils as needed for congestion.   07/29/2015 at Unknown time  . temazepam (RESTORIL) 15 MG capsule Take 15 mg by mouth at bedtime as needed for sleep.   08/21/2015 at Unknown time  . tiotropium (SPIRIVA) 18 MCG inhalation capsule Place 18 mcg into inhaler and inhale daily.   08/22/2015 at Unknown time   Infusions:  . sodium chloride 75 mL/hr at 09/15/15 1729  . heparin 1,300 Units/hr (09/15/15 1459)    Assessment: Patient is a 65yo male admitted for ischemic lower extremity to be initiated on Heparin drip. Patient was appears to have been taking apixaban 5mg  bid prior to admission but patient is unsure when last dose was.   Goal of Therapy:  Heparin level 0.3-0.7 units/ml aPTT  68-109 seconds Monitor platelets by anticoagulation protocol: Yes   Plan:  Heparin level remains elevated but APTT at goal. Will continue heparin infusion at current rate of 1200 units/hr and check a confirmatory APTT in 6 hours. For now, will plan on checking another HL with AM labs.   1/29 AM aPTT 78, heparin level 1.01. Continue current regimen and recheck heparin level and aPTT in AM.  Sim Boast, PharmD, BCPS  09/16/2015   7:06 AM

## 2015-09-16 NOTE — Progress Notes (Addendum)
[facility] [place] NAME: Timothy Juarez    MR#:  [id number]  DATE OF BIRTH:  1951-06-04  SUBJECTIVE:  CHIEF COMPLAINT: Pt is resting comfortable but lt foot pain is not well controlled   REVIEW OF SYSTEMS:  CONSTITUTIONAL: No fever, fatigue or weakness.  EYES: No blurred or double vision.  EARS, NOSE, AND THROAT: No tinnitus or ear pain.  RESPIRATORY: No cough, shortness of breath, wheezing or hemoptysis.  CARDIOVASCULAR: No chest pain, orthopnea, edema.  GASTROINTESTINAL: No nausea, vomiting, diarrhea or abdominal pain.  GENITOURINARY: No dysuria, hematuria.  ENDOCRINE: No polyuria, nocturia,  HEMATOLOGY: No anemia, easy bruising or bleeding SKIN: No rash or lesion. MUSCULOSKELETAL: No joint pain or arthritis.   NEUROLOGIC: No tingling, numbness, weakness.  PSYCHIATRY: No anxiety or depression.   DRUG ALLERGIES:  No Known Allergies  VITALS:  Blood pressure 118/61, pulse 79, temperature 98.5 F (36.9 C), temperature source Oral, resp. rate 17, height 6\' 1"  (1.854 m), weight 91.218 kg (201 lb 1.6 oz), SpO2 96 %.  PHYSICAL EXAMINATION:  GENERAL:  65 y.o.-year-old patient lying in the bed with no acute distress.  EYES: Pupils equal, round, reactive to light and accommodation. No scleral icterus. Extraocular muscles intact.  HEENT: Head atraumatic, normocephalic. Oropharynx and nasopharynx clear.  NECK:  Supple, no jugular venous distention. No thyroid enlargement, no tenderness.  LUNGS: Normal breath sounds bilaterally, no wheezing, rales,rhonchi or crepitation. No use of accessory muscles of respiration.  CARDIOVASCULAR: S1, S2 normal. No murmurs, rubs, or gallops.  ABDOMEN: Soft, nontender, nondistended. Bowel sounds present. No organomegaly or mass.  EXTREMITIES: Left lower extremity with necrotic ulcers on the great toe, fourth and fifth toes, some discharge is also noticed.. Turgor intact No pedal edema, cyanosis, or  clubbing.  NEUROLOGIC: Cranial nerves II through XII are intact. Muscle strength 5/5 in all extremities. Sensation intact. Gait not checked.  PSYCHIATRIC: The patient is alert and oriented x 3.  SKIN: No obvious rash, lesion, or ulcer.    LABORATORY PANEL:   CBC  Recent Labs Lab 09/16/15 0535  WBC 16.8*  HGB 9.8*  HCT 31.9*  PLT 408   ------------------------------------------------------------------------------------------------------------------  Chemistries   Recent Labs Lab 09/15/15 0642  NA 137  K 4.0  CL 107  CO2 22  GLUCOSE 102*  BUN 18  CREATININE 1.32*  CALCIUM 8.4*  MG 2.1   ------------------------------------------------------------------------------------------------------------------  Cardiac Enzymes No results for input(s): TROPONINI in the last 168 hours. ------------------------------------------------------------------------------------------------------------------  RADIOLOGY:  No results found.  EKG:   Orders placed or performed in visit on 03/21/13  . EKG 12-Lead    ASSESSMENT AND PLAN:   Assessment/Plan: 1. Essential hypertension- Currently blood pressure is stable. Continue Lasix and added small dose beta blocker for cardioprotective action  2. Chronic history of congestive heart failure Currently not fluid overloaded Saline lock IVF , pt is eating and drinking well  Continue Lasix 40 mg by mouth twice a day and aspirin Monitor daily weights, intake and output Added small dose beta blocker- metoprolol  3. Chronic history of COPD with past medical history of smoking No COPD exacerbation at this time. Continue Dulera and Spiriva  4. Nonhealing left lower extremity ulcers with peripheral artery disease Management by vascular and podiatry Morphine IV added for better pain control Continue heparin drip and IV antibiotic Zosyn   GI prophylaxis with Protonix DVT prophylaxis-patient is on heparin drip      All the records  are reviewed and  case discussed with Care Management/Social Workerr. Management plans discussed with the patient, family and they are in agreement.  CODE STATUS: fc  TOTAL TIME TAKING CARE OF THIS PATIENT: 35  minutes.     Nicholes Mango M.D on 09/16/2015 at 9:47 PM  Between 7am to 6pm - Pager - [phone] After 6pm go to [email or web address] - password [email or web address] [facility]  Office  [phone]  CC: Primary care physician; Marisa Hua, MD

## 2015-09-16 NOTE — Progress Notes (Signed)
Paged Dr Trula Slade, and notified him that Dr Margaretmary Eddy requested the he call her. Dr Trula Slade acknowledged and stated that he would call Dr Margaretmary Eddy

## 2015-09-16 NOTE — Progress Notes (Signed)
    Subjective  - HD#3  Pain meds changed by [name], which have improved his pain.  Otherwise, he is comfortable   Physical Exam:  CV:RRR Pulm:  CTA Xerofirm to wound with kerlix       Assessment/Plan:   Definitive plan by Dr. Lucky Cowboy tomorrow.  Discussed the possibility of BKA  Lacretia Tindall, [place] 09/16/2015 7:17 PM --  Filed Vitals:   09/16/15 0940 09/16/15 1418  BP: 152/80 118/61  Pulse: 100 79  Temp:  98.5 F (36.9 C)  Resp:      Intake/Output Summary (Last 24 hours) at 09/16/15 1917 Last data filed at 09/16/15 1614  Gross per 24 hour  Intake   2113 ml  Output   2150 ml  Net    -37 ml     Laboratory CBC    Component Value Date/Time   WBC 16.8* 09/16/2015 0535   WBC 11.1* 12/01/2013 1317   HGB 9.8* 09/16/2015 0535   HGB 14.3 12/01/2013 1317   HCT 31.9* 09/16/2015 0535   HCT 43.9 12/01/2013 1317   PLT 408 09/16/2015 0535   PLT 250 12/01/2013 1317    BMET    Component Value Date/Time   NA 137 09/15/2015 0642   NA 136 12/01/2013 1317   K 4.0 09/15/2015 0642   K 3.9 12/01/2013 1317   CL 107 09/15/2015 0642   CL 100 12/01/2013 1317   CO2 22 09/15/2015 0642   CO2 33* 12/01/2013 1317   GLUCOSE 102* 09/15/2015 0642   GLUCOSE 91 12/01/2013 1317   BUN 18 09/15/2015 0642   BUN 24* 09/25/2014 0846   CREATININE 1.32* 09/15/2015 0642   CREATININE 1.29 09/25/2014 0846   CALCIUM 8.4* 09/15/2015 0642   CALCIUM 9.4 12/01/2013 1317   GFRNONAA 55* 09/15/2015 0642   GFRNONAA 60* 09/25/2014 0846   GFRNONAA >60 03/16/2014 0750   GFRAA >60 09/15/2015 0642   GFRAA >60 09/25/2014 0846   GFRAA >60 03/16/2014 0750    COAG Lab Results  Component Value Date   INR 1.38 09/14/2015   No results found for: PTT  Antibiotics Anti-infectives    Start     Dose/Rate Route Frequency Ordered Stop   09/14/15 1400  piperacillin-tazobactam (ZOSYN) IVPB 3.375 g     3.375 g 12.5 mL/hr over 240 Minutes Intravenous 3 times per day 09/14/15 1218     09/14/15 1200   piperacillin-tazobactam (ZOSYN) IVPB 3.375 g  Status:  Discontinued     3.375 g 12.5 mL/hr over 240 Minutes Intravenous 4 times per day 09/14/15 1145 09/14/15 1218       V. Leia Alf, M.D. [facility] Office: [phone] Pager:  [phone]

## 2015-09-17 LAB — CBC
HEMATOCRIT: 32 % — AB (ref 40.0–52.0)
HEMOGLOBIN: 10 g/dL — AB (ref 13.0–18.0)
MCH: 23.1 pg — ABNORMAL LOW (ref 26.0–34.0)
MCHC: 31.3 g/dL — ABNORMAL LOW (ref 32.0–36.0)
MCV: 73.8 fL — ABNORMAL LOW (ref 80.0–100.0)
Platelets: 411 10*3/uL (ref 150–440)
RBC: 4.33 MIL/uL — AB (ref 4.40–5.90)
RDW: 16.1 % — ABNORMAL HIGH (ref 11.5–14.5)
WBC: 17.3 10*3/uL — ABNORMAL HIGH (ref 3.8–10.6)

## 2015-09-17 LAB — APTT: APTT: 63 s — AB (ref 24–36)

## 2015-09-17 LAB — HEPARIN LEVEL (UNFRACTIONATED)
HEPARIN UNFRACTIONATED: 0.33 [IU]/mL (ref 0.30–0.70)
HEPARIN UNFRACTIONATED: 0.34 [IU]/mL (ref 0.30–0.70)

## 2015-09-17 NOTE — Progress Notes (Signed)
ANTICOAGULATION CONSULT NOTE - Initial Consult  Pharmacy Consult for Heparin drip Indication: Ischemic lower extremity  No Known Allergies  Patient Measurements: Height: 6\' 1"  (185.4 cm) Weight: 200 lb 6.4 oz (90.901 kg) IBW/kg (Calculated) : 79.9 Heparin Dosing Weight: 90.7 kg  Vital Signs: Temp: 98.3 F (36.8 C) (01/30 1239) Temp Source: Oral (01/30 1239) BP: 113/60 mmHg (01/30 1239) Pulse Rate: 93 (01/30 1239)  Labs:  Recent Labs  09/15/15 0642 09/15/15 1257 09/16/15 0535 09/17/15 0710 09/17/15 1249  HGB 10.1*  --  9.8* 10.0*  --   HCT 31.2*  --  31.9* 32.0*  --   PLT 394  --  408 411  --   APTT 87* 68* 78* 63*  --   HEPARINUNFRC 1.79*  --  1.01* 0.33 0.34  CREATININE 1.32*  --   --   --   --     Estimated Creatinine Clearance: 63.9 mL/min (by C-G formula based on Cr of 1.32).   Medical History: Past Medical History  Diagnosis Date  . Hypertension   . Peripheral vascular disease ([place])   . CHF (congestive heart failure) ([place])   . COPD (chronic obstructive pulmonary disease) ([place])   . Hypokalemia   . Insomnia   . Pressure ulcer     Medications:  Prescriptions prior to admission  Medication Sig Dispense Refill Last Dose  . amoxicillin-clavulanate (AUGMENTIN) 875-125 MG tablet Take 1 tablet by mouth 2 (two) times daily.   09/13/2015 at Unknown time  . apixaban (ELIQUIS) 2.5 MG TABS tablet Take 2.5 mg by mouth 2 (two) times daily.   09/13/2015 at Unknown time  . acetaminophen (TYLENOL) 325 MG tablet Take 650 mg by mouth at bedtime.    08/21/2015 at Unknown time  . baclofen (LIORESAL) 10 MG tablet Take 5 mg by mouth at bedtime. Reported on 09/14/2015   08/21/2015 at Unknown time  . benzocaine (AMERICAINE) 20 % oral spray Use as directed 1 application in the mouth or throat 4 (four) times daily as needed for throat irritation / pain. Reported on 09/14/2015   Not Taking  . benzocaine (AMERICAINE) 20 % rectal ointment Place rectally every 3 (three) hours as needed for  pain. Reported on 09/14/2015   Not Taking at Unknown time  . clopidogrel (PLAVIX) 75 MG tablet Take 75 mg by mouth daily.   08/22/2015 at Unknown time  . Fluticasone-Salmeterol (ADVAIR) 250-50 MCG/DOSE AEPB Inhale 1 puff into the lungs 2 (two) times daily.   08/22/2015 at Unknown time  . furosemide (LASIX) 40 MG tablet Take 40 mg by mouth 2 (two) times daily.    08/22/2015 at Unknown time  . gabapentin (NEURONTIN) 100 MG capsule Take 100 mg by mouth at bedtime.   08/21/2015 at Unknown time  . guaiFENesin (ROBITUSSIN) 100 MG/5ML liquid Take 100 mg by mouth every 6 (six) hours as needed for cough.   08/21/2015 at Unknown time  . Omega-3 Fatty Acids (FISH OIL) 1000 MG CAPS Take 1,000 mg by mouth every morning.   08/22/2015 at Unknown time  . oxycodone (OXY-IR) 5 MG capsule Take 5 mg by mouth every 4 (four) hours as needed.   07/30/2015 at Unknown time  . oxyCODONE-acetaminophen (PERCOCET) 10-325 MG tablet Take 2 tablets by mouth every 6 (six) hours as needed for pain.   08/21/2015 at Unknown time  . potassium chloride (K-DUR) 10 MEQ tablet Take 20 mEq by mouth daily.    08/22/2015 at Unknown time  . sodium chloride (OCEAN) 0.65 %  SOLN nasal spray Place 1 spray into both nostrils as needed for congestion.   07/29/2015 at Unknown time  . temazepam (RESTORIL) 15 MG capsule Take 15 mg by mouth at bedtime as needed for sleep.   08/21/2015 at Unknown time  . tiotropium (SPIRIVA) 18 MCG inhalation capsule Place 18 mcg into inhaler and inhale daily.   08/22/2015 at Unknown time   Infusions:  . heparin 1,300 Units/hr (09/16/15 1322)    Assessment: Patient is a 65yo male admitted for ischemic lower extremity to be initiated on Heparin drip. Patient appears to have been taking apixaban 5mg  bid prior to admission but patient is unsure when last dose was.   1/30 0700 HL=0.33, aPTT=63 1/30 1249 HL=0.34  Goal of Therapy:  Heparin level 0.3-0.7 units/ml aPTT 68-109 seconds Monitor platelets by anticoagulation protocol: Yes    Plan:  Heparin level is within range. Will continue with current rate of 1300 units/hr and recheck a HL and CBC with AM labs.  Paulina Fusi, PharmD, BCPS 09/17/2015 1:40 PM

## 2015-09-17 NOTE — Progress Notes (Signed)
ANTICOAGULATION CONSULT NOTE - Initial Consult  Pharmacy Consult for Heparin drip Indication: Ischemic lower extremity  No Known Allergies  Patient Measurements: Height: 6\' 1"  (185.4 cm) Weight: 200 lb 6.4 oz (90.901 kg) IBW/kg (Calculated) : 79.9 Heparin Dosing Weight: 90.7 kg  Vital Signs: Temp: 98.7 F (37.1 C) (01/30 0434) Temp Source: Oral (01/30 0434) BP: 107/53 mmHg (01/30 0434) Pulse Rate: 73 (01/30 0434)  Labs:  Recent Labs  09/14/15 1206  09/15/15 0642 09/15/15 1257 09/16/15 0535 09/17/15 0710  HGB 11.3*  --  10.1*  --  9.8* 10.0*  HCT 36.4*  --  31.2*  --  31.9* 32.0*  PLT 443*  --  394  --  408 411  APTT 38*  < > 87* 68* 78* 63*  LABPROT 17.1*  --   --   --   --   --   INR 1.38  --   --   --   --   --   HEPARINUNFRC 3.14*  < > 1.79*  --  1.01* 0.33  CREATININE 1.34*  --  1.32*  --   --   --   < > = values in this interval not displayed.  Estimated Creatinine Clearance: 63.9 mL/min (by C-G formula based on Cr of 1.32).   Medical History: Past Medical History  Diagnosis Date  . Hypertension   . Peripheral vascular disease ([place])   . CHF (congestive heart failure) ([place])   . COPD (chronic obstructive pulmonary disease) ([place])   . Hypokalemia   . Insomnia   . Pressure ulcer     Medications:  Prescriptions prior to admission  Medication Sig Dispense Refill Last Dose  . amoxicillin-clavulanate (AUGMENTIN) 875-125 MG tablet Take 1 tablet by mouth 2 (two) times daily.   09/13/2015 at Unknown time  . apixaban (ELIQUIS) 2.5 MG TABS tablet Take 2.5 mg by mouth 2 (two) times daily.   09/13/2015 at Unknown time  . acetaminophen (TYLENOL) 325 MG tablet Take 650 mg by mouth at bedtime.    08/21/2015 at Unknown time  . baclofen (LIORESAL) 10 MG tablet Take 5 mg by mouth at bedtime. Reported on 09/14/2015   08/21/2015 at Unknown time  . benzocaine (AMERICAINE) 20 % oral spray Use as directed 1 application in the mouth or throat 4 (four) times daily as needed for throat  irritation / pain. Reported on 09/14/2015   Not Taking  . benzocaine (AMERICAINE) 20 % rectal ointment Place rectally every 3 (three) hours as needed for pain. Reported on 09/14/2015   Not Taking at Unknown time  . clopidogrel (PLAVIX) 75 MG tablet Take 75 mg by mouth daily.   08/22/2015 at Unknown time  . Fluticasone-Salmeterol (ADVAIR) 250-50 MCG/DOSE AEPB Inhale 1 puff into the lungs 2 (two) times daily.   08/22/2015 at Unknown time  . furosemide (LASIX) 40 MG tablet Take 40 mg by mouth 2 (two) times daily.    08/22/2015 at Unknown time  . gabapentin (NEURONTIN) 100 MG capsule Take 100 mg by mouth at bedtime.   08/21/2015 at Unknown time  . guaiFENesin (ROBITUSSIN) 100 MG/5ML liquid Take 100 mg by mouth every 6 (six) hours as needed for cough.   08/21/2015 at Unknown time  . Omega-3 Fatty Acids (FISH OIL) 1000 MG CAPS Take 1,000 mg by mouth every morning.   08/22/2015 at Unknown time  . oxycodone (OXY-IR) 5 MG capsule Take 5 mg by mouth every 4 (four) hours as needed.   07/30/2015 at Unknown time  .  oxyCODONE-acetaminophen (PERCOCET) 10-325 MG tablet Take 2 tablets by mouth every 6 (six) hours as needed for pain.   08/21/2015 at Unknown time  . potassium chloride (K-DUR) 10 MEQ tablet Take 20 mEq by mouth daily.    08/22/2015 at Unknown time  . sodium chloride (OCEAN) 0.65 % SOLN nasal spray Place 1 spray into both nostrils as needed for congestion.   07/29/2015 at Unknown time  . temazepam (RESTORIL) 15 MG capsule Take 15 mg by mouth at bedtime as needed for sleep.   08/21/2015 at Unknown time  . tiotropium (SPIRIVA) 18 MCG inhalation capsule Place 18 mcg into inhaler and inhale daily.   08/22/2015 at Unknown time   Infusions:  . heparin 1,300 Units/hr (09/16/15 1322)    Assessment: Patient is a 65yo male admitted for ischemic lower extremity to be initiated on Heparin drip. Patient appears to have been taking apixaban 5mg  bid prior to admission but patient is unsure when last dose was.   1/30 0700 HL=0.33,  aPTT=63  Goal of Therapy:  Heparin level 0.3-0.7 units/ml aPTT 68-109 seconds Monitor platelets by anticoagulation protocol: Yes   Plan:  Heparin level and aPTT appear to be correlating now. Heparin level is within range. Will continue with current rate of 1300 units/hr and recheck a HL in 6 hours.  Paulina Fusi, PharmD, BCPS 09/17/2015 9:09 AM

## 2015-09-17 NOTE — Progress Notes (Signed)
[facility]  Daily Progress Note   Subjective  - * No surgery date entered *  Pain control fair. Foot remains painful and patient understands that he needs to proceed with an amputation. No major events overnight.  Objective Filed Vitals:   09/16/15 2155 09/17/15 0434 09/17/15 0500 09/17/15 1239  BP: 110/48 107/53  113/60  Pulse: 81 73  93  Temp:  98.7 F (37.1 C)  98.3 F (36.8 C)  TempSrc:  Oral  Oral  Resp: 18 17  16   Height:      Weight:   90.901 kg (200 lb 6.4 oz)   SpO2: 98% 95%  96%    Intake/Output Summary (Last 24 hours) at 09/17/15 1854 Last data filed at 09/17/15 1522  Gross per 24 hour  Intake   1623 ml  Output   1875 ml  Net   -252 ml    PULM  CTAB CV  RRR VASC  gangrenous changes present to the toes on the left foot with open ulcerations. He is warm to the lower calf area.  Laboratory CBC    Component Value Date/Time   WBC 17.3* 09/17/2015 0710   WBC 11.1* 12/01/2013 1317   HGB 10.0* 09/17/2015 0710   HGB 14.3 12/01/2013 1317   HCT 32.0* 09/17/2015 0710   HCT 43.9 12/01/2013 1317   PLT 411 09/17/2015 0710   PLT 250 12/01/2013 1317    BMET    Component Value Date/Time   NA 137 09/15/2015 0642   NA 136 12/01/2013 1317   K 4.0 09/15/2015 0642   K 3.9 12/01/2013 1317   CL 107 09/15/2015 0642   CL 100 12/01/2013 1317   CO2 22 09/15/2015 0642   CO2 33* 12/01/2013 1317   GLUCOSE 102* 09/15/2015 0642   GLUCOSE 91 12/01/2013 1317   BUN 18 09/15/2015 0642   BUN 24* 09/25/2014 0846   CREATININE 1.32* 09/15/2015 0642   CREATININE 1.29 09/25/2014 0846   CALCIUM 8.4* 09/15/2015 0642   CALCIUM 9.4 12/01/2013 1317   GFRNONAA 55* 09/15/2015 0642   GFRNONAA 60* 09/25/2014 0846   GFRNONAA >60 03/16/2014 0750   GFRAA >60 09/15/2015 0642   GFRAA >60 09/25/2014 0846   GFRAA >60 03/16/2014 0750    Assessment/Planning:    Non-reconstructable peripheral vascular disease left lower extremity with rest pain and gangrene. He is  undergone multiple previous interventions including an angiogram earlier this month, and no distal target is seen for further intervention. He understands that at this point, our only option would be amputation or pain control and antibiotics which would ultimately still lead to an amputation. He is warm to the calf and I believe he will heal a below-knee amputation based off a patent profunda femoris artery at his last angiogram. He would like to go ahead and proceed with a below-knee amputation. This will be scheduled for Wednesday, February 1. Risks and benefits were discussed and he is agreeable to proceed.  Right lower extremity peripheral vascular disease which is stable and his last ABI was reduced but stable. Small ulcerations here stable as well. No intervention planned at this time.    DEW,JASON  09/17/2015, 6:54 PM

## 2015-09-17 NOTE — Progress Notes (Signed)
[place] [facility] [place] NAME: Timothy Juarez    MR#:  [id number]  DATE OF BIRTH:  05/13/1951  SUBJECTIVE:  CHIEF COMPLAINT: Pt is resting comfortable but lt foot pain is better with morphine. Watching TV  REVIEW OF SYSTEMS:  CONSTITUTIONAL: No fever, fatigue or weakness.  EYES: No blurred or double vision.  EARS, NOSE, AND THROAT: No tinnitus or ear pain.  RESPIRATORY: No cough, shortness of breath, wheezing or hemoptysis.  CARDIOVASCULAR: No chest pain, orthopnea, edema.  GASTROINTESTINAL: No nausea, vomiting, diarrhea or abdominal pain.  GENITOURINARY: No dysuria, hematuria.  ENDOCRINE: No polyuria, nocturia,  HEMATOLOGY: No anemia, easy bruising or bleeding SKIN: Left lower extremity with nonhealing ulcers MUSCULOSKELETAL: No joint pain or arthritis.   NEUROLOGIC: No tingling, numbness, weakness.  PSYCHIATRY: No anxiety or depression.   DRUG ALLERGIES:  No Known Allergies  VITALS:  Blood pressure 113/60, pulse 93, temperature 98.3 F (36.8 C), temperature source Oral, resp. rate 16, height 6\' 1"  (1.854 m), weight 90.901 kg (200 lb 6.4 oz), SpO2 96 %.  PHYSICAL EXAMINATION:  GENERAL:  65 y.o.-year-old patient lying in the bed with no acute distress.  EYES: Pupils equal, round, reactive to light and accommodation. No scleral icterus. Extraocular muscles intact.  HEENT: Head atraumatic, normocephalic. Oropharynx and nasopharynx clear.  NECK:  Supple, no jugular venous distention. No thyroid enlargement, no tenderness.  LUNGS: Normal breath sounds bilaterally, no wheezing, rales,rhonchi or crepitation. No use of accessory muscles of respiration.  CARDIOVASCULAR: S1, S2 normal. No murmurs, rubs, or gallops.  ABDOMEN: Soft, nontender, nondistended. Bowel sounds present. No organomegaly or mass.  EXTREMITIES: Left lower extremity with necrotic ulcers on the great toe, fourth and fifth toes, some discharge is also noticed.. Turgor  intact No pedal edema, cyanosis, or clubbing.  NEUROLOGIC: Cranial nerves II through XII are intact. Muscle strength 5/5 in all extremities. Sensation intact. Gait not checked.  PSYCHIATRIC: The patient is alert and oriented x 3.  SKIN: No obvious rash, lesion, or ulcer.    LABORATORY PANEL:   CBC  Recent Labs Lab 09/17/15 0710  WBC 17.3*  HGB 10.0*  HCT 32.0*  PLT 411   ------------------------------------------------------------------------------------------------------------------  Chemistries   Recent Labs Lab 09/15/15 0642  NA 137  K 4.0  CL 107  CO2 22  GLUCOSE 102*  BUN 18  CREATININE 1.32*  CALCIUM 8.4*  MG 2.1   ------------------------------------------------------------------------------------------------------------------  Cardiac Enzymes No results for input(s): TROPONINI in the last 168 hours. ------------------------------------------------------------------------------------------------------------------  RADIOLOGY:  No results found.  EKG:   Orders placed or performed in visit on 03/21/13  . EKG 12-Lead    ASSESSMENT AND PLAN:   Assessment/Plan: 1. Essential hypertension- Currently blood pressure is stable. Continue Lasix and on small dose beta blocker for cardioprotective action  2. Chronic history of congestive heart failure Currently not fluid overloaded Saline lock IVF , pt is eating and drinking well  Continue Lasix 40 mg by mouth twice a day and aspirin Monitor daily weights, intake and output Added small dose beta blocker- metoprolol Check a.m. labs  3. Chronic history of COPD with past medical history of smoking No COPD exacerbation at this time. Continue Dulera and Spiriva  4. Nonhealing left lower extremity ulcers with peripheral artery disease Management by vascular and podiatry Morphine IV as needed for severe pain Continue heparin drip and IV antibiotic Zosyn Check a.m. labs including CBC and BMP   GI  prophylaxis with Protonix DVT prophylaxis-patient is on  heparin drip      All the records are reviewed and case discussed with Care Management/Social Workerr. Management plans discussed with the patient, family and they are in agreement.  CODE STATUS: fc  TOTAL TIME TAKING CARE OF THIS PATIENT: 35  minutes.     Nicholes Mango M.D on 09/17/2015 at 4:04 PM  Between 7am to 6pm - Pager - [phone] After 6pm go to [email or web address] - password [email or web address] [facility]  Office  [phone]  CC: Primary care physician; Marisa Hua, MD

## 2015-09-18 ENCOUNTER — Inpatient Hospital Stay
Admission: AD | Admit: 2015-09-18 | Discharge: 2015-09-18 | Disposition: A | Discharge status: Home / Self Care | Payer: Medicaid Other | Source: Ambulatory Visit | Attending: Internal Medicine | Admitting: Internal Medicine

## 2015-09-18 LAB — BASIC METABOLIC PANEL
Anion gap: 12 (ref 5–15)
BUN: 17 mg/dL (ref 6–20)
CHLORIDE: 99 mmol/L — AB (ref 101–111)
CO2: 23 mmol/L (ref 22–32)
CREATININE: 1.19 mg/dL (ref 0.61–1.24)
Calcium: 8.6 mg/dL — ABNORMAL LOW (ref 8.9–10.3)
GFR calc Af Amer: >60 mL/min (ref >60)
GFR calc non Af Amer: >60 mL/min (ref >60)
Glucose, Bld: 97 mg/dL (ref 65–99)
Potassium: 3.1 mmol/L — ABNORMAL LOW (ref 3.5–5.1)
Sodium: 134 mmol/L — ABNORMAL LOW (ref 135–145)

## 2015-09-18 LAB — HEPARIN LEVEL (UNFRACTIONATED)
HEPARIN UNFRACTIONATED: 0.24 [IU]/mL — AB (ref 0.30–0.70)
Heparin Unfractionated: 0.28 IU/mL — ABNORMAL LOW (ref 0.30–0.70)
Heparin Unfractionated: 0.43 IU/mL (ref 0.30–0.70)

## 2015-09-18 LAB — CBC
HEMATOCRIT: 31.4 % — AB (ref 40.0–52.0)
HEMOGLOBIN: 10.1 g/dL — AB (ref 13.0–18.0)
MCH: 23 pg — AB (ref 26.0–34.0)
MCHC: 32.1 g/dL (ref 32.0–36.0)
MCV: 71.6 fL — ABNORMAL LOW (ref 80.0–100.0)
Platelets: 426 10*3/uL (ref 150–440)
RBC: 4.39 MIL/uL — ABNORMAL LOW (ref 4.40–5.90)
RDW: 16.5 % — ABNORMAL HIGH (ref 11.5–14.5)
WBC: 18 10*3/uL — ABNORMAL HIGH (ref 3.8–10.6)

## 2015-09-18 MED ORDER — SODIUM CHLORIDE 0.9 % IV SOLN
INTRAVENOUS | Status: DC
  Administered 2015-09-18: 23:00:00 via INTRAVENOUS

## 2015-09-18 MED ORDER — HEPARIN BOLUS VIA INFUSION
1350.0000 [IU] | Freq: Once | INTRAVENOUS | Status: AC
  Administered 2015-09-18: 1350 [IU] via INTRAVENOUS
  Filled 2015-09-18: qty 1350

## 2015-09-18 MED ORDER — CHLORHEXIDINE GLUCONATE CLOTH 2 % EX PADS: 6.0000 | MEDICATED_PAD | Freq: Once | CUTANEOUS | Status: DC

## 2015-09-18 MED ORDER — OXYCODONE-ACETAMINOPHEN 5-325 MG PO TABS
2.0000 | ORAL_TABLET | ORAL | Status: DC | PRN
Start: 2015-09-18 — End: 2015-09-24
  Administered 2015-09-19 – 2015-09-24 (×17): 2 via ORAL
  Filled 2015-09-18 (×18): qty 2

## 2015-09-18 MED ORDER — HEPARIN BOLUS VIA INFUSION
1000.0000 [IU] | Freq: Once | INTRAVENOUS | Status: AC
  Administered 2015-09-18: 1000 [IU] via INTRAVENOUS
  Filled 2015-09-18: qty 1000

## 2015-09-18 MED ORDER — POTASSIUM CHLORIDE CRYS ER 20 MEQ PO TBCR
40.0000 meq | EXTENDED_RELEASE_TABLET | Freq: Once | ORAL | Status: AC
  Administered 2015-09-18: 40 meq via ORAL
  Filled 2015-09-18: qty 2

## 2015-09-18 NOTE — Progress Notes (Signed)
ANTICOAGULATION CONSULT NOTE - Follow up [facility] for Heparin drip Indication: Ischemic lower extremity  No Known Allergies  Patient Measurements: Height: 6\' 1"  (185.4 cm) Weight: 193 lb 11.2 oz (87.862 kg) IBW/kg (Calculated) : 79.9 Heparin Dosing Weight: 90.7 kg  Vital Signs: Temp: 98.7 F (37.1 C) (01/31 1300) Temp Source: Oral (01/31 1300) BP: 127/53 mmHg (01/31 1740) Pulse Rate: 82 (01/31 1740)  Labs:  Recent Labs  09/16/15 0535 09/17/15 0710  09/18/15 0406 09/18/15 1159 09/18/15 1808  HGB 9.8* 10.0*  --  10.1*  --   --   HCT 31.9* 32.0*  --  31.4*  --   --   PLT 408 411  --  426  --   --   APTT 78* 63*  --   --   --   --   HEPARINUNFRC 1.01* 0.33  < > 0.28* 0.43 0.24*  CREATININE  --   --   --  1.19  --   --   < > = values in this interval not displayed.  Estimated Creatinine Clearance: 70.9 mL/min (by C-G formula based on Cr of 1.19).   Medical History: Past Medical History  Diagnosis Date  . Hypertension   . Peripheral vascular disease ([place])   . CHF (congestive heart failure) ([place])   . COPD (chronic obstructive pulmonary disease) ([place])   . Hypokalemia   . Insomnia   . Pressure ulcer     Assessment: Patient is a 65yo male admitted for ischemic lower extremity to be initiated on Heparin drip. Patient appears to have been taking apixaban prior to admission.  1/31 1800 HL subtherapeutic @ 0.24    Goal of Therapy:  Heparin level 0.3-0.7 units/ml aPTT 68-109 seconds Monitor platelets by anticoagulation protocol: Yes   Plan:  Bolus 1350 units (~15 units/kg) Increase heparin infusion rate to 1600 units/hr (~18 units/kg/hr) per protocol  Will order heparin level in 6 hours   Pharmacy will continue to follow.   Theodis Shove, PharmD Clinical Pharmacist 09/18/2015 7:12 PM

## 2015-09-18 NOTE — Progress Notes (Signed)
*  PRELIMINARY RESULTS* Echocardiogram 2D Echocardiogram has been performed.  Timothy Juarez 09/18/2015, 8:03 PM

## 2015-09-18 NOTE — Progress Notes (Signed)
ANTICOAGULATION CONSULT NOTE - Follow up [facility] for Heparin drip Indication: Ischemic lower extremity  No Known Allergies  Patient Measurements: Height: 6\' 1"  (185.4 cm) Weight: 193 lb 11.2 oz (87.862 kg) IBW/kg (Calculated) : 79.9 Heparin Dosing Weight: 90.7 kg  Vital Signs: Temp: 98.8 F (37.1 C) (01/31 0448) Temp Source: Oral (01/31 0448) BP: 129/56 mmHg (01/31 0448) Pulse Rate: 76 (01/31 0448)  Labs:  Recent Labs  09/15/15 1257  09/16/15 0535 09/17/15 0710 09/17/15 1249 09/18/15 0406 09/18/15 1159  HGB  --   < > 9.8* 10.0*  --  10.1*  --   HCT  --   --  31.9* 32.0*  --  31.4*  --   PLT  --   --  408 411  --  426  --   APTT 68*  --  78* 63*  --   --   --   HEPARINUNFRC  --   < > 1.01* 0.33 0.34 0.28* 0.43  CREATININE  --   --   --   --   --  1.19  --   < > = values in this interval not displayed.  Estimated Creatinine Clearance: 70.9 mL/min (by C-G formula based on Cr of 1.19).   Medical History: Past Medical History  Diagnosis Date  . Hypertension   . Peripheral vascular disease ([place])   . CHF (congestive heart failure) ([place])   . COPD (chronic obstructive pulmonary disease) ([place])   . Hypokalemia   . Insomnia   . Pressure ulcer     Assessment: Patient is a 65yo male admitted for ischemic lower extremity to be initiated on Heparin drip. Patient appears to have been taking apixaban prior to admission.  1/31 1159 HL 0.43 Per RN, no line issues, no s/sx of bleeding; RN confirms heparin running at 14.5 ml/hr.   Goal of Therapy:  Heparin level 0.3-0.7 units/ml aPTT 68-109 seconds Monitor platelets by anticoagulation protocol: Yes   Plan:  Heparin level therapeutic. Will continue current rate. Hgb and Plt are stable from yesterday.  Will recheck confirmatory level in 6 hours. CBC in AM  Pharmacy will continue to follow.   Rayna Sexton, PharmD, BCPS Clinical Pharmacist 09/18/2015 1:04 PM

## 2015-09-18 NOTE — Progress Notes (Signed)
[facility] [place] NAME: Timothy Juarez    MR#:  [id number]  DATE OF BIRTH:  Sep 18, 1950  SUBJECTIVE:  CHIEF COMPLAINT: Pt with no complaints today. Scheduled for surgery tomorrow. Denies any shortness of breath  REVIEW OF SYSTEMS:  CONSTITUTIONAL: No fever, fatigue or weakness.  EYES: No blurred or double vision.  EARS, NOSE, AND THROAT: No tinnitus or ear pain.  RESPIRATORY: No cough, shortness of breath, wheezing or hemoptysis.  CARDIOVASCULAR: No chest pain, orthopnea, edema.  GASTROINTESTINAL: No nausea, vomiting, diarrhea or abdominal pain.  GENITOURINARY: No dysuria, hematuria.  ENDOCRINE: No polyuria, nocturia,  HEMATOLOGY: No anemia, easy bruising or bleeding SKIN: Left lower extremity with nonhealing ulcers MUSCULOSKELETAL: No joint pain or arthritis.   NEUROLOGIC: No tingling, numbness, weakness.  PSYCHIATRY: No anxiety or depression.   DRUG ALLERGIES:  No Known Allergies  VITALS:  Blood pressure 129/56, pulse 76, temperature 98.8 F (37.1 C), temperature source Oral, resp. rate 18, height 6\' 1"  (1.854 m), weight 87.862 kg (193 lb 11.2 oz), SpO2 100 %.  PHYSICAL EXAMINATION:  GENERAL:  65 y.o.-year-old patient lying in the bed with no acute distress.  EYES: Pupils equal, round, reactive to light and accommodation. No scleral icterus. Extraocular muscles intact.  HEENT: Head atraumatic, normocephalic. Oropharynx and nasopharynx clear.  NECK:  Supple, no jugular venous distention. No thyroid enlargement, no tenderness.  LUNGS: Normal breath sounds bilaterally, no wheezing, rales,rhonchi or crepitation. No use of accessory muscles of respiration.  CARDIOVASCULAR: S1, S2 normal. No murmurs, rubs, or gallops.  ABDOMEN: Soft, nontender, nondistended. Bowel sounds present. No organomegaly or mass.  EXTREMITIES: Left lower extremity with necrotic ulcers on the great toe, fourth and fifth toes, some discharge is also  noticed.. Turgor intact No pedal edema, cyanosis, or clubbing.  NEUROLOGIC: Cranial nerves II through XII are intact. Muscle strength 5/5 in all extremities. Sensation intact. Gait not checked.  PSYCHIATRIC: The patient is alert and oriented x 3.  SKIN: No obvious rash, lesion, or ulcer.    LABORATORY PANEL:   CBC  Recent Labs Lab 09/18/15 0406  WBC 18.0*  HGB 10.1*  HCT 31.4*  PLT 426   ------------------------------------------------------------------------------------------------------------------  Chemistries   Recent Labs Lab 09/15/15 0642 09/18/15 0406  NA 137 134*  K 4.0 3.1*  CL 107 99*  CO2 22 23  GLUCOSE 102* 97  BUN 18 17  CREATININE 1.32* 1.19  CALCIUM 8.4* 8.6*  MG 2.1  --    ------------------------------------------------------------------------------------------------------------------  Cardiac Enzymes No results for input(s): TROPONINI in the last 168 hours. ------------------------------------------------------------------------------------------------------------------  RADIOLOGY:  No results found.  EKG:   Orders placed or performed in visit on 03/21/13  . EKG 12-Lead    ASSESSMENT AND PLAN:   Assessment/Plan: 1. Essential hypertension- Currently blood pressure is stable. Continue Lasix and on small dose beta blocker for cardioprotective action  2. Chronic history of congestive heart failure Currently not fluid overloaded Echo ordered Saline lock IVF , pt is eating and drinking well  Continue Lasix 40 mg by mouth twice a day and aspirin Monitor daily weights, intake and output Added small dose beta blocker- metoprolol Check a.m. Labs Echo ordered  3. Chronic history of COPD with past medical history of smoking No COPD exacerbation at this time. Continue Dulera and Spiriva  4. Nonhealing left lower extremity ulcers with peripheral artery disease Management by vascular and podiatry, for surgery in a.m. Morphine IV as needed  for severe pain Continue heparin drip  and IV antibiotic Zosyn Check a.m. labs including CBC and BMP   GI prophylaxis with Protonix DVT prophylaxis-patient is on heparin drip      All the records are reviewed and case discussed with Care Management/Social Workerr. Management plans discussed with the patient, family and they are in agreement.  CODE STATUS: fc  TOTAL TIME TAKING CARE OF THIS PATIENT: 35  minutes.     Nicholes Mango M.D on 09/18/2015 at 4:29 PM  Between 7am to 6pm - Pager - [phone] After 6pm go to [email or web address] - password [email or web address] [email or web address]  Office  [phone]  CC: Primary care physician; Marisa Hua, MD

## 2015-09-18 NOTE — NC FL2 (Signed)
[place] LEVEL OF CARE SCREENING TOOL     IDENTIFICATION  Patient Name: Timothy Juarez Birthdate: 12/09/1950 Sex: male Admission Date (Current Location): 09/14/2015  [place] and [place] Number:  Engineering geologist and Address:  [facility], [address] [place], [place] [address]      Provider Number: [id number]  Attending Physician Name and Address:  Algernon Huxley, MD  Relative Name and Phone Number:       Current Level of Care: Hospital Recommended Level of Care: [place] Prior Approval Number:    Date Approved/Denied:   PASRR Number:    Discharge Plan: SNF    Current Diagnoses: Patient Active Problem List   Diagnosis Date Noted  . Ischemia of lower extremity 09/14/2015  . Atherosclerotic peripheral vascular disease with ulceration ([place]) 01/18/2015    Orientation RESPIRATION BLADDER Height & Weight        Normal Continent Weight: 193 lb 11.2 oz (87.862 kg) Height:  6\' 1"  (185.4 cm)  BEHAVIORAL SYMPTOMS/MOOD NEUROLOGICAL BOWEL NUTRITION STATUS     (none) Continent    AMBULATORY STATUS COMMUNICATION OF NEEDS Skin   Extensive Assist Verbally Normal, PU Stage and Appropriate Care                       Personal Care Assistance Level of Assistance              Functional Limitations Info             SPECIAL CARE FACTORS FREQUENCY                       Contractures Contractures Info: Not present    Additional Factors Info                  Current Medications (09/18/2015):  This is the current hospital active medication list Current Facility-Administered Medications  Medication Dose Route Frequency Provider Last Rate Last Dose  . acetaminophen (TYLENOL) tablet 650 mg  650 mg Oral Q6H PRN Janalyn Harder Stegmayer, PA-C   650 mg at 09/18/15 [id number]   Or  . acetaminophen (TYLENOL) suppository 650 mg  650 mg Rectal Q6H PRN Janalyn Harder Stegmayer, PA-C      . alum & mag  hydroxide-simeth (MAALOX/MYLANTA) 200-200-20 MG/5ML suspension 30 mL  30 mL Oral Q6H PRN Janalyn Harder Stegmayer, PA-C      . aspirin EC tablet 81 mg  81 mg Oral Daily Kimberly A Stegmayer, PA-C   81 mg at 09/18/15 0948  . baclofen (LIORESAL) tablet 5 mg  5 mg Oral QHS Kimberly A Stegmayer, PA-C   5 mg at 09/17/15 2122  . docusate sodium (COLACE) capsule 100 mg  100 mg Oral BID Janalyn Harder Stegmayer, PA-C   100 mg at 09/18/15 0947  . feeding supplement (ENSURE ENLIVE) (ENSURE ENLIVE) liquid 237 mL  237 mL Oral BID BM Algernon Huxley, MD   237 mL at 09/17/15 1000  . furosemide (LASIX) tablet 40 mg  40 mg Oral BID Janalyn Harder Stegmayer, PA-C   40 mg at 09/18/15 0948  . gabapentin (NEURONTIN) capsule 100 mg  100 mg Oral QHS Kimberly A Stegmayer, PA-C   100 mg at 09/17/15 2123  . heparin ADULT infusion 100 units/mL (25000 units/250 mL)  1,450 Units/hr Intravenous Continuous Algernon Huxley, MD 14.5 mL/hr at 09/18/15 1017 1,450 Units/hr at 09/18/15 1017  . metoprolol tartrate (LOPRESSOR) tablet 12.5 mg  12.5 mg Oral BID Nicholes Mango, MD   12.5 mg at 09/18/15 0948  . mometasone-formoterol (DULERA) 100-5 MCG/ACT inhaler 2 puff  2 puff Inhalation BID Janalyn Harder Stegmayer, PA-C   2 puff at 09/18/15 [phone]  . morphine 2 MG/ML injection 2 mg  2 mg Intravenous Q4H PRN Nicholes Mango, MD   2 mg at 09/18/15 1308  . ondansetron (ZOFRAN) tablet 4 mg  4 mg Oral Q6H PRN Kimberly A Stegmayer, PA-C       Or  . ondansetron (ZOFRAN) injection 4 mg  4 mg Intravenous Q6H PRN Kimberly A Stegmayer, PA-C      . oxyCODONE-acetaminophen (PERCOCET/ROXICET) 5-325 MG per tablet 2 tablet  2 tablet Oral Q4H PRN Nicholes Mango, MD   2 tablet at 09/16/15 1524  . pantoprazole (PROTONIX) EC tablet 40 mg  40 mg Oral Daily Kimberly A Stegmayer, PA-C   40 mg at 09/18/15 0948  . piperacillin-tazobactam (ZOSYN) IVPB 3.375 g  3.375 g Intravenous 3 times per day Sela Hua, PA-C   3.375 g at 09/18/15 0653  . polyethylene glycol (MIRALAX / GLYCOLAX) packet  17 g  17 g Oral Daily PRN Kimberly A Stegmayer, PA-C      . potassium chloride SA (K-DUR,KLOR-CON) CR tablet 20 mEq  20 mEq Oral Daily Kimberly A Stegmayer, PA-C   20 mEq at 09/18/15 0948  . temazepam (RESTORIL) capsule 15 mg  15 mg Oral QHS PRN Janalyn Harder Stegmayer, PA-C      . tiotropium [facility]) inhalation capsule 18 mcg  18 mcg Inhalation Daily Janalyn Harder Stegmayer, PA-C   18 mcg at 09/18/15 [id number]     Discharge Medications: Please see discharge summary for a list of discharge medications.  Relevant Imaging Results:  Relevant Lab Results:   Additional Information    Shela Leff, LCSW

## 2015-09-18 NOTE — Clinical Social Work Note (Signed)
Clinical Social Work Assessment  Patient Details  Name: Timothy Juarez MRN: [id number] Date of Birth: 1951/02/07  Date of referral:                  Reason for consult:                   Permission sought to share information with:    Permission granted to share information::     Name::        Agency::     Relationship::     Contact Information:     Housing/Transportation Living arrangements for the past 2 months:    Source of Information:    Patient Interpreter Needed:    Criminal Activity/Legal Involvement Pertinent to Current Situation/Hospitalization:    Significant Relationships:    Lives with:    Do you feel safe going back to the place where you live?    Need for family participation in patient care:     Care giving concerns: None   Social Worker assessment / plan:  Patient is a long term resident at [facility]. CSW attempted to speak with patient this afternoon but he was sleeping soundly. [name] at [facility] stated that patient can return at discharge and that he has been with them for years. Patient does have his wheelchair in his hospital room currently. CSW will return to attempt to speak with patient when he is awake. FL2 completed and in epic.  Employment status:    Insurance information:    PT Recommendations:    Information / Referral to community resources:     Patient/Family's Response to care:  Unable to determine at this time.  Patient/Family's Understanding of and Emotional Response to Diagnosis, Current Treatment, and Prognosis: Unable to determine at this time.  Emotional Assessment Appearance:    Attitude/Demeanor/Rapport:    Affect (typically observed):    Orientation:    Alcohol / Substance use:    Psych involvement (Current and /or in the community):     Discharge Needs  Concerns to be addressed:    Readmission within the last 30 days:    Current discharge risk:    Barriers to Discharge:      Shela Leff,  LCSW 09/18/2015, 3:16 PM

## 2015-09-18 NOTE — Progress Notes (Signed)
ANTICOAGULATION CONSULT NOTE - Initial Consult  Pharmacy Consult for Heparin drip Indication: Ischemic lower extremity  No Known Allergies  Patient Measurements: Height: 6\' 1"  (185.4 cm) Weight: 193 lb 11.2 oz (87.862 kg) IBW/kg (Calculated) : 79.9 Heparin Dosing Weight: 90.7 kg  Vital Signs: Temp: 98.8 F (37.1 C) (01/31 0448) Temp Source: Oral (01/31 0448) BP: 129/56 mmHg (01/31 0448) Pulse Rate: 76 (01/31 0448)  Labs:  Recent Labs  09/15/15 0642 09/15/15 1257 09/16/15 0535 09/17/15 0710 09/17/15 1249 09/18/15 0406  HGB 10.1*  --  9.8* 10.0*  --  10.1*  HCT 31.2*  --  31.9* 32.0*  --  31.4*  PLT 394  --  408 411  --  426  APTT 87* 68* 78* 63*  --   --   HEPARINUNFRC 1.79*  --  1.01* 0.33 0.34 0.28*  CREATININE 1.32*  --   --   --   --  1.19    Estimated Creatinine Clearance: 70.9 mL/min (by C-G formula based on Cr of 1.19).   Medical History: Past Medical History  Diagnosis Date  . Hypertension   . Peripheral vascular disease ([place])   . CHF (congestive heart failure) ([place])   . COPD (chronic obstructive pulmonary disease) ([place])   . Hypokalemia   . Insomnia   . Pressure ulcer     Medications:  Prescriptions prior to admission  Medication Sig Dispense Refill Last Dose  . amoxicillin-clavulanate (AUGMENTIN) 875-125 MG tablet Take 1 tablet by mouth 2 (two) times daily.   09/13/2015 at Unknown time  . apixaban (ELIQUIS) 2.5 MG TABS tablet Take 2.5 mg by mouth 2 (two) times daily.   09/13/2015 at Unknown time  . acetaminophen (TYLENOL) 325 MG tablet Take 650 mg by mouth at bedtime.    08/21/2015 at Unknown time  . baclofen (LIORESAL) 10 MG tablet Take 5 mg by mouth at bedtime. Reported on 09/14/2015   08/21/2015 at Unknown time  . benzocaine (AMERICAINE) 20 % oral spray Use as directed 1 application in the mouth or throat 4 (four) times daily as needed for throat irritation / pain. Reported on 09/14/2015   Not Taking  . benzocaine (AMERICAINE) 20 % rectal ointment Place  rectally every 3 (three) hours as needed for pain. Reported on 09/14/2015   Not Taking at Unknown time  . clopidogrel (PLAVIX) 75 MG tablet Take 75 mg by mouth daily.   08/22/2015 at Unknown time  . Fluticasone-Salmeterol (ADVAIR) 250-50 MCG/DOSE AEPB Inhale 1 puff into the lungs 2 (two) times daily.   08/22/2015 at Unknown time  . furosemide (LASIX) 40 MG tablet Take 40 mg by mouth 2 (two) times daily.    08/22/2015 at Unknown time  . gabapentin (NEURONTIN) 100 MG capsule Take 100 mg by mouth at bedtime.   08/21/2015 at Unknown time  . guaiFENesin (ROBITUSSIN) 100 MG/5ML liquid Take 100 mg by mouth every 6 (six) hours as needed for cough.   08/21/2015 at Unknown time  . Omega-3 Fatty Acids (FISH OIL) 1000 MG CAPS Take 1,000 mg by mouth every morning.   08/22/2015 at Unknown time  . oxycodone (OXY-IR) 5 MG capsule Take 5 mg by mouth every 4 (four) hours as needed.   07/30/2015 at Unknown time  . oxyCODONE-acetaminophen (PERCOCET) 10-325 MG tablet Take 2 tablets by mouth every 6 (six) hours as needed for pain.   08/21/2015 at Unknown time  . potassium chloride (K-DUR) 10 MEQ tablet Take 20 mEq by mouth daily.    08/22/2015  at Unknown time  . sodium chloride (OCEAN) 0.65 % SOLN nasal spray Place 1 spray into both nostrils as needed for congestion.   07/29/2015 at Unknown time  . temazepam (RESTORIL) 15 MG capsule Take 15 mg by mouth at bedtime as needed for sleep.   08/21/2015 at Unknown time  . tiotropium (SPIRIVA) 18 MCG inhalation capsule Place 18 mcg into inhaler and inhale daily.   08/22/2015 at Unknown time   Infusions:  . heparin 1,300 Units/hr (09/17/15 1448)    Assessment: Patient is a 65yo male admitted for ischemic lower extremity to be initiated on Heparin drip. Patient appears to have been taking apixaban 5mg  bid prior to admission but patient is unsure when last dose was.   1/30 0700 HL=0.33, aPTT=63 1/30 1249 HL=0.34  Goal of Therapy:  Heparin level 0.3-0.7 units/ml aPTT 68-109 seconds Monitor  platelets by anticoagulation protocol: Yes   Plan:  Heparin level subtherapeutic. 1000 unit IV x 1 bolus and increase rate to 1450 units/hr. Will recheck level in 6 hours.  Madelina Sanda A. [place], [place].D., BCPS Clinical Pharmacist 09/18/2015 5:49 AM

## 2015-09-18 NOTE — Progress Notes (Signed)
[facility]  Daily Progress Note   Subjective  - * No surgery date entered *  Pain control fair.  No major events. Ready for surgery tomorrow  Objective Filed Vitals:   09/17/15 1239 09/17/15 2018 09/18/15 0317 09/18/15 0448  BP: 113/60 146/73  129/56  Pulse: 93 98  76  Temp: 98.3 F (36.8 C) 99 F (37.2 C)  98.8 F (37.1 C)  TempSrc: Oral Oral  Oral  Resp: 16 20  18   Height:      Weight:   87.862 kg (193 lb 11.2 oz)   SpO2: 96% 95%  100%    Intake/Output Summary (Last 24 hours) at 09/18/15 0849 Last data filed at 09/18/15 0615  Gross per 24 hour  Intake    440 ml  Output   1750 ml  Net  -1310 ml    PULM  CTAB CV  RRR VASC  Gangrenous changes several toes left foot and open ulcerations.  Laboratory CBC    Component Value Date/Time   WBC 18.0* 09/18/2015 0406   WBC 11.1* 12/01/2013 1317   HGB 10.1* 09/18/2015 0406   HGB 14.3 12/01/2013 1317   HCT 31.4* 09/18/2015 0406   HCT 43.9 12/01/2013 1317   PLT 426 09/18/2015 0406   PLT 250 12/01/2013 1317    BMET    Component Value Date/Time   NA 134* 09/18/2015 0406   NA 136 12/01/2013 1317   K 3.1* 09/18/2015 0406   K 3.9 12/01/2013 1317   CL 99* 09/18/2015 0406   CL 100 12/01/2013 1317   CO2 23 09/18/2015 0406   CO2 33* 12/01/2013 1317   GLUCOSE 97 09/18/2015 0406   GLUCOSE 91 12/01/2013 1317   BUN 17 09/18/2015 0406   BUN 24* 09/25/2014 0846   CREATININE 1.19 09/18/2015 0406   CREATININE 1.29 09/25/2014 0846   CALCIUM 8.6* 09/18/2015 0406   CALCIUM 9.4 12/01/2013 1317   GFRNONAA >60 09/18/2015 0406   GFRNONAA 60* 09/25/2014 0846   GFRNONAA >60 03/16/2014 0750   GFRAA >60 09/18/2015 0406   GFRAA >60 09/25/2014 0846   GFRAA >60 03/16/2014 0750    Assessment/Planning:    Gangrene left foot with severe PAD.  For BKA tomorrow.  Risks and benefits discussed and he is agreeable to proceed.  Right leg PAD stable.  Monitor ongoing as an  outpatient    DEW,JASON  09/18/2015, 8:49 AM

## 2015-09-19 ENCOUNTER — Encounter
Admission: AD | Disposition: A | Discharge status: Skilled Nursing Facility | Payer: Self-pay | Source: Ambulatory Visit | Attending: Vascular Surgery

## 2015-09-19 ENCOUNTER — Encounter: Payer: Self-pay | Admitting: *Deleted

## 2015-09-19 ENCOUNTER — Inpatient Hospital Stay: Payer: Medicaid Other | Admitting: Anesthesiology

## 2015-09-19 HISTORY — PX: AMPUTATION: SHX166

## 2015-09-19 LAB — BASIC METABOLIC PANEL
Anion gap: 9 (ref 5–15)
BUN: 20 mg/dL (ref 6–20)
CALCIUM: 9 mg/dL (ref 8.9–10.3)
CO2: 23 mmol/L (ref 22–32)
Chloride: 106 mmol/L (ref 101–111)
Creatinine, Ser: 1.15 mg/dL (ref 0.61–1.24)
GFR calc Af Amer: >60 mL/min (ref >60)
GLUCOSE: 94 mg/dL (ref 65–99)
Potassium: 3.7 mmol/L (ref 3.5–5.1)
Sodium: 138 mmol/L (ref 135–145)

## 2015-09-19 LAB — CBC
HEMATOCRIT: 32.8 % — AB (ref 40.0–52.0)
Hemoglobin: 10.3 g/dL — ABNORMAL LOW (ref 13.0–18.0)
MCH: 22.9 pg — AB (ref 26.0–34.0)
MCHC: 31.3 g/dL — AB (ref 32.0–36.0)
MCV: 73.1 fL — AB (ref 80.0–100.0)
PLATELETS: 422 10*3/uL (ref 150–440)
RBC: 4.48 MIL/uL (ref 4.40–5.90)
RDW: 16.5 % — AB (ref 11.5–14.5)
WBC: 18.5 10*3/uL — ABNORMAL HIGH (ref 3.8–10.6)

## 2015-09-19 LAB — HEPARIN LEVEL (UNFRACTIONATED): Heparin Unfractionated: <0.1 IU/mL — ABNORMAL LOW (ref 0.30–0.70)

## 2015-09-19 LAB — SURGICAL PCR SCREEN
MRSA, PCR: NEGATIVE
Staphylococcus aureus: NEGATIVE

## 2015-09-19 LAB — MAGNESIUM: Magnesium: 2.2 mg/dL (ref 1.7–2.4)

## 2015-09-19 SURGERY — AMPUTATION BELOW KNEE
Anesthesia: General | Laterality: Left

## 2015-09-19 SURGERY — AMPUTATION BELOW KNEE
Anesthesia: General | Laterality: Left | Wound class: Clean

## 2015-09-19 MED ORDER — ESMOLOL HCL 100 MG/10ML IV SOLN
INTRAVENOUS | Status: DC | PRN
  Administered 2015-09-19: 20 mg via INTRAVENOUS

## 2015-09-19 MED ORDER — FENTANYL CITRATE (PF) 100 MCG/2ML IJ SOLN
INTRAMUSCULAR | Status: DC | PRN
  Administered 2015-09-19: 100 ug via INTRAVENOUS
  Administered 2015-09-19 (×2): 50 ug via INTRAVENOUS

## 2015-09-19 MED ORDER — ROCURONIUM BROMIDE 100 MG/10ML IV SOLN
INTRAVENOUS | Status: DC | PRN
  Administered 2015-09-19: 40 mg via INTRAVENOUS

## 2015-09-19 MED ORDER — PHENYLEPHRINE HCL 10 MG/ML IJ SOLN
INTRAMUSCULAR | Status: DC | PRN
  Administered 2015-09-19: 200 ug via INTRAVENOUS
  Administered 2015-09-19: 100 ug via INTRAVENOUS

## 2015-09-19 MED ORDER — LIDOCAINE HCL (CARDIAC) 20 MG/ML IV SOLN
INTRAVENOUS | Status: DC | PRN
  Administered 2015-09-19: 80 mg via INTRAVENOUS

## 2015-09-19 MED ORDER — FENTANYL CITRATE (PF) 100 MCG/2ML IJ SOLN
25.0000 ug | INTRAMUSCULAR | Status: DC | PRN
  Administered 2015-09-19 (×2): 50 ug via INTRAVENOUS
  Administered 2015-09-19 (×2): 25 ug via INTRAVENOUS

## 2015-09-19 MED ORDER — ONDANSETRON HCL 4 MG/2ML IJ SOLN
INTRAMUSCULAR | Status: DC | PRN
  Administered 2015-09-19: 4 mg via INTRAVENOUS

## 2015-09-19 MED ORDER — MIDAZOLAM HCL 2 MG/2ML IJ SOLN
INTRAMUSCULAR | Status: DC | PRN
  Administered 2015-09-19: 2 mg via INTRAVENOUS

## 2015-09-19 MED ORDER — NEOSTIGMINE METHYLSULFATE 10 MG/10ML IV SOLN
INTRAVENOUS | Status: DC | PRN
  Administered 2015-09-19: 3 mg via INTRAVENOUS

## 2015-09-19 MED ORDER — GLYCOPYRROLATE 0.2 MG/ML IJ SOLN
INTRAMUSCULAR | Status: DC | PRN
  Administered 2015-09-19: .4 mg via INTRAVENOUS

## 2015-09-19 MED ORDER — CEFAZOLIN SODIUM 1 G IJ SOLR
INTRAMUSCULAR | Status: DC | PRN
  Administered 2015-09-19: 2 g via INTRAMUSCULAR

## 2015-09-19 MED ORDER — FENTANYL CITRATE (PF) 100 MCG/2ML IJ SOLN
INTRAMUSCULAR | Status: AC
  Administered 2015-09-19: 25 ug via INTRAVENOUS
  Filled 2015-09-19: qty 2

## 2015-09-19 MED ORDER — OXYCODONE HCL 5 MG/5ML PO SOLN: 5.0000 mg | Freq: Once | ORAL | Status: DC | PRN

## 2015-09-19 MED ORDER — OXYCODONE HCL 5 MG PO TABS: 5.0000 mg | ORAL_TABLET | Freq: Once | ORAL | Status: DC | PRN

## 2015-09-19 MED ORDER — CEFAZOLIN SODIUM 1-5 GM-% IV SOLN
1.0000 g | Freq: Three times a day (TID) | INTRAVENOUS | Status: AC
  Administered 2015-09-19 – 2015-09-20 (×2): 1 g via INTRAVENOUS
  Filled 2015-09-19 (×3): qty 50

## 2015-09-19 MED ORDER — PROPOFOL 10 MG/ML IV BOLUS
INTRAVENOUS | Status: DC | PRN
  Administered 2015-09-19 (×2): 50 mg via INTRAVENOUS
  Administered 2015-09-19: 150 mg via INTRAVENOUS

## 2015-09-19 MED ORDER — FENTANYL CITRATE (PF) 100 MCG/2ML IJ SOLN
INTRAMUSCULAR | Status: AC
  Administered 2015-09-19: 50 ug via INTRAVENOUS
  Filled 2015-09-19: qty 2

## 2015-09-19 MED ORDER — MORPHINE SULFATE (PF) 2 MG/ML IV SOLN
2.0000 mg | INTRAVENOUS | Status: DC | PRN
  Administered 2015-09-19 – 2015-09-21 (×10): 2 mg via INTRAVENOUS
  Filled 2015-09-19 (×10): qty 1

## 2015-09-19 SURGICAL SUPPLY — 36 items
BANDAGE ELASTIC 6 LF NS (GAUZE/BANDAGES/DRESSINGS) ×3 IMPLANT
BLADE SAGITTAL WIDE XTHICK NO (BLADE) ×3 IMPLANT
BNDG CMPR MED 5X6 ELC HKLP NS (GAUZE/BANDAGES/DRESSINGS) ×1
BNDG COHESIVE 4X5 TAN STRL (GAUZE/BANDAGES/DRESSINGS) ×3 IMPLANT
BNDG GAUZE 4.5X4.1 6PLY STRL (MISCELLANEOUS) ×6 IMPLANT
BRUSH SCRUB 4% CHG (MISCELLANEOUS) ×3 IMPLANT
CANISTER SUCT 1200ML W/VALVE (MISCELLANEOUS) ×3 IMPLANT
DRAIN PENROSE 1/4X12 LTX (DRAIN) ×3 IMPLANT
DRAPE INCISE IOBAN 66X45 STRL (DRAPES) ×2 IMPLANT
DURAPREP 26ML APPLICATOR (WOUND CARE) ×3 IMPLANT
ELECT CAUTERY BLADE 6.4 (BLADE) ×3 IMPLANT
ELECT REM PT RETURN 9FT ADLT (ELECTROSURGICAL) ×3
ELECTRODE REM PT RTRN 9FT ADLT (ELECTROSURGICAL) ×1 IMPLANT
GAUZE PETRO XEROFOAM 1X8 (MISCELLANEOUS) ×6 IMPLANT
GLOVE BIO SURGEON STRL SZ7 (GLOVE) ×3 IMPLANT
GOWN STRL REUS W/ TWL LRG LVL3 (GOWN DISPOSABLE) ×1 IMPLANT
GOWN STRL REUS W/ TWL XL LVL3 (GOWN DISPOSABLE) ×1 IMPLANT
GOWN STRL REUS W/TWL LRG LVL3 (GOWN DISPOSABLE) ×3
GOWN STRL REUS W/TWL XL LVL3 (GOWN DISPOSABLE) ×3
HANDLE YANKAUER SUCT BULB TIP (MISCELLANEOUS) ×3 IMPLANT
KIT RM TURNOVER STRD PROC AR (KITS) ×3 IMPLANT
LABEL OR SOLS (LABEL) ×1 IMPLANT
NS IRRIG 1000ML POUR BTL (IV SOLUTION) ×3 IMPLANT
PACK EXTREMITY ARMC (MISCELLANEOUS) ×3 IMPLANT
PAD ABD DERMACEA PRESS 5X9 (GAUZE/BANDAGES/DRESSINGS) ×6 IMPLANT
PAD PREP 24X41 OB/GYN DISP (PERSONAL CARE ITEMS) ×3 IMPLANT
SPONGE LAP 18X18 5 PK (GAUZE/BANDAGES/DRESSINGS) ×3 IMPLANT
STAPLER SKIN PROX 35W (STAPLE) ×3 IMPLANT
STOCKINETTE M/LG 89821 (MISCELLANEOUS) ×3 IMPLANT
SUT SILK 2 0 (SUTURE) ×3
SUT SILK 2 0 SH (SUTURE) ×6 IMPLANT
SUT SILK 2-0 18XBRD TIE 12 (SUTURE) ×1 IMPLANT
SUT SILK 3 0 (SUTURE) ×3
SUT SILK 3-0 18XBRD TIE 12 (SUTURE) ×1 IMPLANT
SUT VIC AB 0 CT1 36 (SUTURE) ×6 IMPLANT
SUT VIC AB 2-0 CT1 (SUTURE) ×6 IMPLANT

## 2015-09-19 NOTE — Transfer of Care (Signed)
Immediate Anesthesia Transfer of Care Note  Patient: Timothy Juarez  Procedure(s) Performed: Procedure(s): AMPUTATION BELOW KNEE (Left)  Patient Location: PACU  Anesthesia Type:General  Level of Consciousness: awake, alert , oriented and patient cooperative  Airway & Oxygen Therapy: Patient Spontanous Breathing and Patient connected to face mask oxygen  Post-op Assessment: Report given to RN, Post -op Vital signs reviewed and stable and Patient moving all extremities X 4  Post vital signs: Reviewed and stable  Last Vitals:  Filed Vitals:   09/19/15 1356 09/19/15 1551  BP: 133/73 161/88  Pulse: 80 100  Temp: 36.2 C 36.2 C  Resp: 18 30    Complications: No apparent anesthesia complications

## 2015-09-19 NOTE — Op Note (Signed)
   OPERATIVE NOTE   PROCEDURE: left below-the-knee amputation  PRE-OPERATIVE DIAGNOSIS: left foot gangrene  POST-OPERATIVE DIAGNOSIS: same as above  SURGEON: [name], MD  ASSISTANT(S): None  ANESTHESIA: general  ESTIMATED BLOOD LOSS: 100 cc  FINDING(S): Viable calf muscle  SPECIMEN(S):  left below-the-knee amputation  INDICATIONS:   Timothy Juarez is a 65 y.o. male who presents with left leg gangrene.  The patient is scheduled for a left below-the-knee amputation.  I discussed in depth with the patient the risks, benefits, and alternatives to this procedure.  The patient is aware that the risk of this operation included but are not limited to:  bleeding, infection, myocardial infarction, stroke, death, failure to heal amputation wound, and possible need for more proximal amputation.  The patient is aware of the risks and agrees proceed forward with the procedure.  DESCRIPTION:  After full informed written consent was obtained from the patient, the patient was brought back to the operating room, and placed supine upon the operating table.  Prior to induction, the patient received IV antibiotics.  The patient was then prepped and draped in the standard fashion for a below-the-knee amputation.  After obtaining adequate anesthesia, the patient was prepped and draped in the standard fashion for a below-the-knee amputation.  I marked out the anterior incision two finger breadths below the tibial tuberosity and then the marked out a posterior flap that was one third of the circumference of the calf in length.   I made the incisions for these flaps, and then dissected through the subcutaneous tissue, fascia, and muscle anteriorly.  I elevated  the periosteal tissue superiorly so that the tibia was about 3-4 cm shorter than the anterior skin flap.  I then transected the tibia with a power saw and then took a wedge off the tibia anteriorly with the power saw.  Then I smoothed out the rough  edges.  In a similar fashion, I cut back the fibula about two centimeters higher than the level of the tibia with a bone cutter.  I put a bone hook into the distal tibia and then used a large amputation knife to sharply develop a tissue plane through the muscle along the fibula.  In such fashion, the posterior flap was developed.  At this point, the specimen was passed off the field as the below-the-knee amputation.  At this point, I clamped all visibly bleeding arteries and veins using a combination of suture ligation with Silk suture and electrocautery.  Bleeding continued to be controlled with electrocautery and suture ligature.  The stump was washed off with sterile normal saline and no further active bleeding was noted.  I reapproximated the anterior and posterior fascia  with interrupted stitches of 0 Vicryl.  This was completed along the entire length of anterior and posterior fascia until there were no more loose space in the fascial line. I then placed a layer of 2-0 Vicryl sutures in the subcutaneous tissue. The skin was then  reapproximated with staples.  The stump was washed off and dried.  The incision was dressed with Xeroform and  then fluffs were applied.  Kerlix was wrapped around the leg and then gently an ACE wrap was applied.    COMPLICATIONS: none  CONDITION: stable   DEW,JASON  09/19/2015, 3:44 PM

## 2015-09-19 NOTE — Progress Notes (Signed)
Per Dr Lucky Cowboy place order for regular diet

## 2015-09-19 NOTE — Anesthesia Postprocedure Evaluation (Signed)
Anesthesia Post Note  Patient: Timothy Juarez  Procedure(s) Performed: Procedure(s) (LRB): AMPUTATION BELOW KNEE (Left)  Patient location during evaluation: PACU Anesthesia Type: General Level of consciousness: awake and alert Pain management: pain level controlled Vital Signs Assessment: post-procedure vital signs reviewed and stable Respiratory status: spontaneous breathing, nonlabored ventilation, respiratory function stable and patient connected to nasal cannula oxygen Cardiovascular status: blood pressure returned to baseline and stable Postop Assessment: no signs of nausea or vomiting Anesthetic complications: no    Last Vitals:  Filed Vitals:   09/19/15 1636 09/19/15 1704  BP: 140/82 134/81  Pulse: 99 95  Temp:  36.9 C  Resp: 15 17    Last Pain:  Filed Vitals:   09/19/15 1733  PainSc: 10-Worst pain ever                 Precious Haws S[place]onia Hoots

## 2015-09-19 NOTE — Progress Notes (Signed)
[facility] [place] NAME: Timothy Juarez    MR#:  [id number]  DATE OF BIRTH:  [birth year]  SUBJECTIVE:  CHIEF COMPLAINT:  No chief complaint on file.  patient states he is doing well, wonders what time his surgery is Denies pain fever or chills  REVIEW OF SYSTEMS:  CONSTITUTIONAL: No fever, fatigue or weakness.  EYES: No blurred or double vision.  EARS, NOSE, AND THROAT: No tinnitus or ear pain.  RESPIRATORY: No cough, shortness of breath, wheezing or hemoptysis.  CARDIOVASCULAR: No chest pain, orthopnea, edema.  GASTROINTESTINAL: No nausea, vomiting, diarrhea or abdominal pain.  GENITOURINARY: No dysuria, hematuria.  ENDOCRINE: No polyuria, nocturia,  HEMATOLOGY: No anemia, easy bruising or bleeding SKIN: No rash or lesion. MUSCULOSKELETAL: No joint pain or arthritis.   NEUROLOGIC: No tingling, numbness, weakness.  PSYCHIATRY: No anxiety or depression.   DRUG ALLERGIES:  No Known Allergies  VITALS:  Blood pressure 112/59, pulse 85, temperature 98.6 F (37 C), temperature source Oral, resp. rate 20, height 6\' 1"  (1.854 m), weight 85.639 kg (188 lb 12.8 oz), SpO2 100 %.  PHYSICAL EXAMINATION:  VITAL SIGNS: Filed Vitals:   09/18/15 2055 09/19/15 0456  BP: 125/61 112/59  Pulse: 89 85  Temp:  98.6 F (37 C)  Resp:  20   GENERAL:64 y.o.male currently in no acute distress.  HEAD: Normocephalic, atraumatic.  EYES: Pupils equal, round, reactive to light. Extraocular muscles intact. No scleral icterus.  MOUTH: Moist mucosal membrane. Dentition intact. No abscess noted.  EAR, NOSE, THROAT: Clear without exudates. No external lesions.  NECK: Supple. No thyromegaly. No nodules. No JVD.  PULMONARY: Clear to ascultation, without wheeze rails or rhonci. No use of accessory muscles, Good respiratory effort. good air entry bilaterally CHEST: Nontender to palpation.  CARDIOVASCULAR: S1 and S2. Regular rate and rhythm. No murmurs,  rubs, or gallops. No edema. Pedal pulses 2+ bilaterally.  GASTROINTESTINAL: Soft, nontender, nondistended. No masses. Positive bowel sounds. No hepatosplenomegaly.  MUSCULOSKELETAL: Right lower extremity in dressing some serosanguineous discharge right heel soaking through dressing No swelling, clubbing, or edema. Range of motion full in all extremities.  NEUROLOGIC: Cranial nerves II through XII are intact. No gross focal neurological deficits. Sensation intact. Reflexes intact.  SKIN: Right heel ulceration nonhealing otherwise No ulceration, lesions, rashes, or cyanosis. Skin warm and dry. Turgor intact.  PSYCHIATRIC: Mood, affect within normal limits. The patient is awake, alert and oriented x 3. Insight, judgment intact.      LABORATORY PANEL:   CBC  Recent Labs Lab 09/19/15 0413  WBC 18.5*  HGB 10.3*  HCT 32.8*  PLT 422   ------------------------------------------------------------------------------------------------------------------  Chemistries   Recent Labs Lab 09/19/15 0413  NA 138  K 3.7  CL 106  CO2 23  GLUCOSE 94  BUN 20  CREATININE 1.15  CALCIUM 9.0  MG 2.2   ------------------------------------------------------------------------------------------------------------------  Cardiac Enzymes No results for input(s): TROPONINI in the last 168 hours. ------------------------------------------------------------------------------------------------------------------  RADIOLOGY:  No results found.  EKG:   Orders placed or performed in visit on 03/21/13  . EKG 12-Lead    ASSESSMENT AND PLAN:  65 year old African American gentleman history of essential hypertension,  peripheral arterial disease, COPD non-option requiring admitted 09/14/2015 for nonhealing ulcers   Assessment/Plan: 1. Essential hypertension- Currently blood pressure is stable. Continue metoprolol perioperatively. Hold Lasix this morning can restart postoperatively  2. Chronic history  of congestive heart failure unspecified ejection fraction Currently not fluid overloaded Echo ordered Saline lock IVF ,  pt is eating and drinking well  Hold Lasix preoperatively can start after surgery   3. Chronic history of COPD with past medical history of smoking No COPD exacerbation at this time. Continue Dulera and Spiriva  4. Nonhealing left lower extremity ulcers with peripheral artery disease Management by vascular and podiatry, for surgery today Morphine IV as needed for severe pain Continue heparin drip and IV antibiotic Zosyn    GI prophylaxis with Protonix DVT prophylaxis-patient is on heparin drip     All the records are reviewed and case discussed with Care Management/Social Workerr. Management plans discussed with the patient, family and they are in agreement.  CODE STATUS: Full code  TOTAL TIME TAKING CARE OF THIS PATIENT: 30 minutes.   POSSIBLE D/C IN 3-4 DAYS, DEPENDING ON CLINICAL CONDITION.   Hower,  Karenann Cai.D on 09/19/2015 at 10:50 AM  Between 7am to 6pm - Pager - [phone]  After 6pm: House Pager: - [phone]  [facility]  Office  [phone]  CC: Primary care physician; Marisa Hua, MD

## 2015-09-19 NOTE — H&P (Signed)
  [facility] History & Physical Update  The patient was interviewed and re-examined.  The patient's previous History and Physical has been reviewed and is unchanged.  There is no change in the plan of care. We plan to proceed with the scheduled procedure.  Monty Spicher, MD  09/19/2015, 11:53 AM

## 2015-09-19 NOTE — Anesthesia Procedure Notes (Addendum)
Procedure Name: Intubation Performed by: Jaycie Kregel Pre-anesthesia Checklist: Patient identified, Patient being monitored, Timeout performed, Emergency Drugs available and Suction available Patient Re-evaluated:Patient Re-evaluated prior to inductionOxygen Delivery Method: Circle system utilized Preoxygenation: Pre-oxygenation with 100% oxygen Intubation Type: IV induction Ventilation: Mask ventilation without difficulty Laryngoscope Size: Mac and 4 Grade View: Grade I Tube type: Oral Tube size: 7.5 mm Number of attempts: 1 Airway Equipment and Method: Stylet Placement Confirmation: ETT inserted through vocal cords under direct vision,  positive ETCO2 and breath sounds checked- equal and bilateral Secured at: 23 cm Tube secured with: Tape Dental Injury: Teeth and Oropharynx as per pre-operative assessment        

## 2015-09-19 NOTE — Progress Notes (Signed)
ANTICOAGULATION CONSULT NOTE - Follow up [facility] for Heparin drip Indication: Ischemic lower extremity  No Known Allergies  Patient Measurements: Height: 6\' 1"  (185.4 cm) Weight: 188 lb (85.276 kg) IBW/kg (Calculated) : 79.9 Heparin Dosing Weight: 90.7 kg  Vital Signs: Temp: 97.2 F (36.2 C) (02/01 1356) Temp Source: Tympanic (02/01 1356) BP: 133/73 mmHg (02/01 1356) Pulse Rate: 80 (02/01 1356)  Labs:  Recent Labs  09/17/15 0710  09/18/15 0406 09/18/15 1159 09/18/15 1808 09/19/15 0041 09/19/15 0413  HGB 10.0*  --  10.1*  --   --   --  10.3*  HCT 32.0*  --  31.4*  --   --   --  32.8*  PLT 411  --  426  --   --   --  422  APTT 63*  --   --   --   --   --   --   HEPARINUNFRC 0.33  < > 0.28* 0.43 0.24* <0.10*  --   CREATININE  --   --  1.19  --   --   --  1.15  < > = values in this interval not displayed.  Estimated Creatinine Clearance: 73.3 mL/min (by C-G formula based on Cr of 1.15).   Medical History: Past Medical History  Diagnosis Date  . Hypertension   . Peripheral vascular disease ([place])   . CHF (congestive heart failure) ([place])   . COPD (chronic obstructive pulmonary disease) ([place])   . Hypokalemia   . Insomnia   . Pressure ulcer     Assessment: Patient is a 65yo male admitted for ischemic lower extremity to be initiated on Heparin drip. Patient appears to have been taking apixaban prior to admission.  1/31 1800 HL subtherapeutic @ 0.24    Goal of Therapy:  Heparin level 0.3-0.7 units/ml aPTT 68-109 seconds Monitor platelets by anticoagulation protocol: Yes   Plan:  Bolus 1350 units (~15 units/kg) Increase heparin infusion rate to 1600 units/hr (~18 units/kg/hr) per protocol Will order heparin level in 6 hours   2/1: Heparin drip stopped ~0000 d/t planned vascular surgery today 2/1. F/u if Heparin drip is to be resumed after surgery.  Pharmacy will continue to follow.   Chinita Greenland PharmD Clinical  Pharmacist 09/19/2015 3:14 PM

## 2015-09-19 NOTE — Anesthesia Preprocedure Evaluation (Signed)
Anesthesia Evaluation  Patient identified by MRN, date of birth, ID band Patient awake    Reviewed: Allergy & Precautions, H&P , NPO status , Patient's Chart, lab work & pertinent test results  History of Anesthesia Complications Negative for: history of anesthetic complications  Airway Mallampati: III  TM Distance: >3 FB Neck ROM: limited    Dental  (+) Poor Dentition, Chipped, Missing   Pulmonary COPD, former smoker,    Pulmonary exam normal breath sounds clear to auscultation       Cardiovascular Exercise Tolerance: Poor hypertension, (-) angina+ Peripheral Vascular Disease, +CHF and + DOE  (-) Past MI Normal cardiovascular exam Rhythm:regular Rate:Normal     Neuro/Psych negative neurological ROS  negative psych ROS   GI/Hepatic negative GI ROS, Neg liver ROS, neg GERD  ,  Endo/Other  negative endocrine ROS  Renal/GU negative Renal ROS  negative genitourinary   Musculoskeletal   Abdominal   Peds  Hematology negative hematology ROS (+)   Anesthesia Other Findings Past Medical History:   Hypertension                                                 Peripheral vascular disease (HCC)                            CHF (congestive heart failure) (HCC)                         COPD (chronic obstructive pulmonary disease) (*              Hypokalemia                                                  Insomnia                                                     Pressure ulcer                                              Past Surgical History:   PERIPHERAL VASCULAR CATHETERIZATION             Left 01/18/2015       Comment:Procedure: Lower Extremity Angiography;                Surgeon: Algernon Huxley, MD;  Location: [facility];  Service: Cardiovascular;                Laterality: Left;   PERIPHERAL VASCULAR CATHETERIZATION             N/A 01/18/2015       Comment:Procedure: Lower Extremity Intervention;                 Surgeon: Algernon Huxley, MD;  Location: [facility]  INVASIVE [facility];  Service: Cardiovascular;                Laterality: N/A;   PERIPHERAL VASCULAR CATHETERIZATION              07/30/2015     Comment:Procedure: Lower Extremity Intervention;                Surgeon: Algernon Huxley, MD;  Location: [facility];  Service: Cardiovascular;;   PERIPHERAL VASCULAR CATHETERIZATION             N/A 07/30/2015     Comment:Procedure: Abdominal Aortogram w/Lower               Extremity;  Surgeon: Algernon Huxley, MD;                Location: [facility];  Service:               Cardiovascular;  Laterality: N/A;   PERIPHERAL VASCULAR CATHETERIZATION             Left 08/22/2015       Comment:Procedure: Lower Extremity Angiography;                Surgeon: Algernon Huxley, MD;  Location: [facility];  Service: Cardiovascular;                Laterality: Left;   PERIPHERAL VASCULAR CATHETERIZATION             Left 08/22/2015       Comment:Procedure: Lower Extremity Intervention;                Surgeon: Algernon Huxley, MD;  Location: [facility];  Service: Cardiovascular;                Laterality: Left;  BMI    Body Mass Index   24.80 kg/m 2      Reproductive/Obstetrics negative OB ROS                             Anesthesia Physical Anesthesia Plan  ASA: III  Anesthesia Plan: General ETT   Post-op Pain Management:    Induction:   Airway Management Planned:   Additional Equipment:   Intra-op Plan:   Post-operative Plan:   Informed Consent: I have reviewed the patients History and Physical, chart, labs and discussed the procedure including the risks, benefits and alternatives for the proposed anesthesia with the patient or authorized representative who has indicated his/her understanding and acceptance.   Dental Advisory Given  Plan Discussed with: Anesthesiologist, CRNA and  Surgeon  Anesthesia Plan Comments:         Anesthesia Quick Evaluation

## 2015-09-20 ENCOUNTER — Encounter: Payer: Self-pay | Admitting: Vascular Surgery

## 2015-09-20 LAB — CBC
HEMATOCRIT: 30 % — AB (ref 40.0–52.0)
Hemoglobin: 9.7 g/dL — ABNORMAL LOW (ref 13.0–18.0)
MCH: 23.5 pg — ABNORMAL LOW (ref 26.0–34.0)
MCHC: 32.3 g/dL (ref 32.0–36.0)
MCV: 72.8 fL — AB (ref 80.0–100.0)
PLATELETS: 403 10*3/uL (ref 150–440)
RBC: 4.12 MIL/uL — AB (ref 4.40–5.90)
RDW: 16.5 % — ABNORMAL HIGH (ref 11.5–14.5)
WBC: 15.9 10*3/uL — AB (ref 3.8–10.6)

## 2015-09-20 LAB — BASIC METABOLIC PANEL
Anion gap: 7 (ref 5–15)
BUN: 17 mg/dL (ref 6–20)
CHLORIDE: 109 mmol/L (ref 101–111)
CO2: 24 mmol/L (ref 22–32)
Calcium: 8.7 mg/dL — ABNORMAL LOW (ref 8.9–10.3)
Creatinine, Ser: 1.1 mg/dL (ref 0.61–1.24)
Glucose, Bld: 98 mg/dL (ref 65–99)
POTASSIUM: 3.8 mmol/L (ref 3.5–5.1)
SODIUM: 140 mmol/L (ref 135–145)

## 2015-09-20 NOTE — Clinical Documentation Improvement (Signed)
Internal Medicine Vascular Surgery  Can the diagnosis of "chronic history of congested heart failure " be further specified? Thank you     Type - Systolic, Diastolic, Systolic and Diastolic  Other  Clinically Undetermined   Document any associated diagnoses/conditions  Treatment/Evaluation  Continued PO lasix 40 mg/ daily weights ordered    Please exercise your independent, professional judgment when responding. A specific answer is not anticipated or expected.   Thank You,  [place] [phone]

## 2015-09-20 NOTE — Progress Notes (Addendum)
[facility] [place] NAME: Timothy Juarez    MR#:  [id number]  DATE OF BIRTH:  August 27, 1950  SUBJECTIVE:  Postoperative day one left below knee amputation No complaints at this time, states pain is well-controlled, no issues with bowel movements  REVIEW OF SYSTEMS:  CONSTITUTIONAL: No fever, fatigue or weakness.  EYES: No blurred or double vision.  EARS, NOSE, AND THROAT: No tinnitus or ear pain.  RESPIRATORY: No cough, shortness of breath, wheezing or hemoptysis.  CARDIOVASCULAR: No chest pain, orthopnea, edema.  GASTROINTESTINAL: No nausea, vomiting, diarrhea or abdominal pain.  GENITOURINARY: No dysuria, hematuria.  ENDOCRINE: No polyuria, nocturia,  HEMATOLOGY: No anemia, easy bruising or bleeding SKIN: No rash or lesion. MUSCULOSKELETAL: No joint pain or arthritis.   NEUROLOGIC: No tingling, numbness, weakness.  PSYCHIATRY: No anxiety or depression.   DRUG ALLERGIES:  No Known Allergies  VITALS:  Blood pressure 119/56, pulse 72, temperature 98.2 F (36.8 C), temperature source Oral, resp. rate 18, height 6\' 1"  (1.854 m), weight 84.34 kg (185 lb 15 oz), SpO2 98 %.  PHYSICAL EXAMINATION:  VITAL SIGNS: Filed Vitals:   09/20/15 0900 09/20/15 1059  BP: 122/54 119/56  Pulse: 81 72  Temp: 98.2 F (36.8 C)   Resp: 18    GENERAL:65 y.o.male currently in no acute distress.  HEAD: Normocephalic, atraumatic.  EYES: Pupils equal, round, reactive to light. Extraocular muscles intact. No scleral icterus.  MOUTH: Moist mucosal membrane. Dentition intact. No abscess noted.  EAR, NOSE, THROAT: Clear without exudates. No external lesions.  NECK: Supple. No thyromegaly. No nodules. No JVD.  PULMONARY: Clear to ascultation, without wheeze rails or rhonci. No use of accessory muscles, Good respiratory effort. good air entry bilaterally CHEST: Nontender to palpation.  CARDIOVASCULAR: S1 and S2. Regular rate and rhythm. No murmurs,  rubs, or gallops. No edema. Pedal pulses 2+ bilaterally.  GASTROINTESTINAL: Soft, nontender, nondistended. No masses. Positive bowel sounds. No hepatosplenomegaly.  MUSCULOSKELETAL: Status post left below-knee amputation dressing in place NEUROLOGIC: No gross focal neurological deficits. Sensation intact. Reflexes intact.  SKIN: No ulceration, lesions, rashes, or cyanosis. Skin warm and dry. Turgor intact.  PSYCHIATRIC: Mood, affect within normal limits. The patient is awake, alert and oriented x 3. Insight, judgment intact.      LABORATORY PANEL:   CBC  Recent Labs Lab 09/20/15 0459  WBC 15.9*  HGB 9.7*  HCT 30.0*  PLT 403   ------------------------------------------------------------------------------------------------------------------  Chemistries   Recent Labs Lab 09/19/15 0413 09/20/15 0459  NA 138 140  K 3.7 3.8  CL 106 109  CO2 23 24  GLUCOSE 94 98  BUN 20 17  CREATININE 1.15 1.10  CALCIUM 9.0 8.7*  MG 2.2  --    ------------------------------------------------------------------------------------------------------------------  Cardiac Enzymes No results for input(s): TROPONINI in the last 168 hours. ------------------------------------------------------------------------------------------------------------------  RADIOLOGY:  No results found.  EKG:   Orders placed or performed in visit on 03/21/13  . EKG 12-Lead    ASSESSMENT AND PLAN:   65 year old African American gentleman history of essential hypertension, peripheral arterial disease, COPD non-option requiring admitted 09/14/2015 for nonhealing ulcers now postop day 1 left below-knee amputation   Assessment/Plan: 1. Essential hypertension- Currently blood pressure is stable. Continue metoprolol perioperatively. Restart her Lasix  2. Chronic history of congestive heart failure diastolic, ef [id number] Currently not fluid overloaded  3. Chronic history of COPD with past medical history of  smoking No COPD exacerbation at this time. Continue Dulera and Spiriva  4.  Nonhealing left lower extremity ulcers with peripheral artery disease Management by vascular and podiatry,  Postop day 1 left below-knee amputation Morphine IV as needed for severe pain       All the records are reviewed and case discussed with Care Management/Social Workerr. Management plans discussed with the patient, family and they are in agreement.  CODE STATUS: Full  TOTAL TIME TAKING CARE OF THIS PATIENT: 28 minutes.   POSSIBLE D/C IN 2-3 DAYS, DEPENDING ON CLINICAL CONDITION.   Hower,  Karenann Cai.D on 09/20/2015 at 12:14 PM  Between 7am to 6pm - Pager - [phone]  After 6pm: House Pager: [phone]  [facility]  Office  [phone]  CC: Primary care physician; Marisa Hua, MD

## 2015-09-20 NOTE — Evaluation (Signed)
Physical Therapy Evaluation Patient Details Name: Timothy Juarez MRN: [id number] DOB: 1951-04-19 Today's Date: 09/20/2015   History of Present Illness  The past medical history of severe peripheral arterial disease was admitted to vascular surgery service for left lower extent the pain and nonhealing ulcers. Pt underwent L BKA secondary t o L foot gangrene. Pt is POD#1 at time of evaluation. AOx4 at time of evaluation. Pt reports that prior to November he was ambulating full facility distances with a quad cane at [facility] where is a LTC resident. Since November he has had nonhealing ulcers with severe [name] pain which has made him transfers only to wheelchair  Clinical Impression  Pt demonstrates good UE and RLE strength during evaluation. He was ambulating fairly recently but has been limited due to nonhealing [name] ulcers and pain. Pt demonstrates independence with bed mobility. He requires minA+1 for transfers and modA+2 for short forward/backward ambulation at EOB. Pt would benefit from SNF placement but per CSW no SNF benefit. Please arrange PT at LTC facility. RN and pt educated about positioning as upon arrival pt with pillow under knee. Please keep towel under stump to prevent L knee flexion contractions and hip ER to make [name] prosthesis an option. Pt in agreement. Pt will benefit from skilled PT services to address deficits in strength, balance, and mobility in order to return to full function at home.     Follow Up Recommendations SNF (Per CSW, no SNF benefit. Please arrange [facility] at LTC)    Equipment Recommendations  None recommended by PT    Recommendations for Other Services Rehab consult     Precautions / Restrictions Precautions Precautions: Fall Restrictions Weight Bearing Restrictions: No      Mobility  Bed Mobility Overal bed mobility: Modified Independent             General bed mobility comments: Pt demonstrates good speed and sequencing with use of bed  rails for both phases of bed mobility  Transfers Overall transfer level: Needs assistance Equipment used: Rolling walker (2 wheeled) Transfers: Sit to/from Stand Sit to Stand: Min assist         General transfer comment: Pt demonstrates good RLE strength during transfer but difficulty with single leg balance. Cues for proper hand placement. MinA+1 for balance required. Once upright requires bilateral UE support for balance  Ambulation/Gait Ambulation/Gait assistance: Mod assist;+2 physical assistance Ambulation Distance (Feet): 3 Feet Assistive device: Rolling walker (2 wheeled)   Gait velocity: Decreased Gait velocity interpretation: <1.8 ft/sec, indicative of risk for recurrent falls General Gait Details: Pt able to take a few steps forward/backward with rolling walker and cues for sequencing. Pt demonstrates good UE strength and ability to hop on RLE. Pt reports increase in [name] pain due to dependent positioning.  Stairs            Wheelchair Mobility    Modified Rankin (Stroke Patients Only)       Balance Overall balance assessment: Needs assistance Sitting-balance support: No upper extremity supported Sitting balance-Leahy Scale: Good     Standing balance support: Bilateral upper extremity supported Standing balance-Leahy Scale: Poor Standing balance comment: Requires UE support in standing due to L BKA                             Pertinent Vitals/Pain Pain Assessment: 0-10 Pain Score: 8  Pain Location: [name] surgical site Pain Intervention(s): Limited activity within patient's tolerance;Monitored during  session;Repositioned    Home Living Family/patient expects to be discharged to:: Assisted living               Home Equipment: Gilford Rile - 2 wheels;Cane - quad;Wheelchair - manual      Prior Function Level of Independence: Independent with assistive device(s)         Comments: Prior to November he was ambulating full facility distances  with quad cane. Since that time he has been transfer to wheelchair due to [name] ulcers/pain. Pt reports continued independence with ADLs.      Hand Dominance   Dominant Hand: Right    Extremity/Trunk Assessment   Upper Extremity Assessment: Overall WFL for tasks assessed           Lower Extremity Assessment: [name] deficits/detail   [name] Deficits / Details: RLE strength is grossly WFL. At least 4+/5 throughout. L hip flexion 4+/5. Active L knee flexion/extension against gravity. L BKA     Communication   Communication: No difficulties  Cognition Arousal/Alertness: Awake/alert Behavior During Therapy: WFL for tasks assessed/performed Overall Cognitive Status: Within Functional Limits for tasks assessed                      General Comments      Exercises General Exercises - Lower Extremity Long Arc Quad: Strengthening;Both;10 reps;Seated Hip ABduction/ADduction: Strengthening;Both;10 reps;Seated Hip Flexion/Marching: Strengthening;Both;10 reps;Seated Other Exercises Other Exercises: Seated resisted RLE knee flexion, seated AROM L knee flexion x 10 each      Assessment/Plan    PT Assessment Patient needs continued PT services  PT Diagnosis Difficulty walking;Abnormality of gait;Generalized weakness;Acute pain   PT Problem List Decreased strength;Decreased activity tolerance;Decreased balance;Decreased mobility;Decreased knowledge of use of DME;Pain  PT Treatment Interventions DME instruction;Gait training;Therapeutic activities;Functional mobility training;Balance training;Therapeutic exercise;Neuromuscular re-education;Patient/family education;Wheelchair mobility training   PT Goals (Current goals can be found in the Care Plan section) Acute Rehab PT Goals Patient Stated Goal: Return to walking PT Goal Formulation: With patient Time For Goal Achievement: 10/04/15 Potential to Achieve Goals: Fair    Frequency 7X/week   Barriers to discharge         Co-evaluation               End of Session Equipment Utilized During Treatment: Gait belt Activity Tolerance: Patient limited by pain Patient left: in bed;with call bell/phone within reach;with bed alarm set;with nursing/sitter in room (Towel roll under stump, no pillow under knee) Nurse Communication: Mobility status         Time: [id number] PT Time Calculation (min) (ACUTE ONLY): 21 min   Charges:   PT Evaluation $PT Eval Low Complexity: 1 Procedure PT Treatments $Therapeutic Exercise: 8-22 mins   PT G Codes:       Lyndel Safe Kentrel Clevenger PT, DPT    Jaydin Boniface 09/20/2015, 12:04 PM

## 2015-09-20 NOTE — Progress Notes (Signed)
 Vein and Vascular Surgery  Daily Progress Note   Subjective  - 1 Day Post-Op  Pain control was not good last night, better today Worked with PT and did well Keeping leg pretty straight  Objective Filed Vitals:   09/20/15 0500 09/20/15 0558 09/20/15 0900 09/20/15 1059  BP:  135/62 122/54 119/56  Pulse:  84 81 72  Temp:  98.7 F (37.1 C) 98.2 F (36.8 C)   TempSrc:  Oral Oral   Resp:  16 18   Height:      Weight: 84.34 kg (185 lb 15 oz)     SpO2:  96% 98%     Intake/Output Summary (Last 24 hours) at 09/20/15 1411 Last data filed at 09/20/15 1100  Gross per 24 hour  Intake   1240 ml  Output    750 ml  Net    490 ml    PULM  CTAB CV  RRR VASC  Dressing C/d/i  Laboratory CBC    Component Value Date/Time   WBC 15.9* 09/20/2015 0459   WBC 11.1* 12/01/2013 1317   HGB 9.7* 09/20/2015 0459   HGB 14.3 12/01/2013 1317   HCT 30.0* 09/20/2015 0459   HCT 43.9 12/01/2013 1317   PLT 403 09/20/2015 0459   PLT 250 12/01/2013 1317    BMET    Component Value Date/Time   NA 140 09/20/2015 0459   NA 136 12/01/2013 1317   K 3.8 09/20/2015 0459   K 3.9 12/01/2013 1317   CL 109 09/20/2015 0459   CL 100 12/01/2013 1317   CO2 24 09/20/2015 0459   CO2 33* 12/01/2013 1317   GLUCOSE 98 09/20/2015 0459   GLUCOSE 91 12/01/2013 1317   BUN 17 09/20/2015 0459   BUN 24* 09/25/2014 0846   CREATININE 1.10 09/20/2015 0459   CREATININE 1.29 09/25/2014 0846   CALCIUM 8.7* 09/20/2015 0459   CALCIUM 9.4 12/01/2013 1317   GFRNONAA >60 09/20/2015 0459   GFRNONAA 60* 09/25/2014 0846   GFRNONAA >60 03/16/2014 0750   GFRAA >60 09/20/2015 0459   GFRAA >60 09/25/2014 0846   GFRAA >60 03/16/2014 0750    Assessment/Planning: POD #1 s/p left BKA   Pain control better today  Did well with PT  Likely back to SNF this weekend or first of the week    Timothy Juarez  09/20/2015, 2:11 PM

## 2015-09-21 MED ORDER — MORPHINE SULFATE (PF) 2 MG/ML IV SOLN
2.0000 mg | INTRAVENOUS | Status: DC | PRN
  Administered 2015-09-21 – 2015-09-22 (×2): 2 mg via INTRAVENOUS
  Filled 2015-09-21 (×3): qty 1

## 2015-09-21 MED ORDER — FUROSEMIDE 40 MG PO TABS
40.0000 mg | ORAL_TABLET | Freq: Every day | ORAL | Status: DC
  Administered 2015-09-21 – 2015-09-24 (×4): 40 mg via ORAL
  Filled 2015-09-21 (×4): qty 1

## 2015-09-21 MED ORDER — POLYETHYLENE GLYCOL 3350 17 G PO PACK
17.0000 g | PACK | Freq: Every day | ORAL | Status: DC
Start: 2015-09-21 — End: 2015-09-24
  Administered 2015-09-21 – 2015-09-23 (×3): 17 g via ORAL
  Filled 2015-09-21 (×4): qty 1

## 2015-09-21 MED ORDER — SENNA 8.6 MG PO TABS
2.0000 | ORAL_TABLET | Freq: Every day | ORAL | Status: DC
  Administered 2015-09-21 – 2015-09-23 (×3): 17.2 mg via ORAL
  Filled 2015-09-21 (×4): qty 2

## 2015-09-21 NOTE — Progress Notes (Signed)
Physical Therapy Treatment Patient Details Name: Timothy Juarez MRN: [id number] DOB: June 17, 1951 Today's Date: 09/21/2015    History of Present Illness The past medical history of severe peripheral arterial disease was admitted to vascular surgery service for left lower extent the pain and nonhealing ulcers. Pt underwent L BKA secondary t o L foot gangrene. Pt is POD#1 at time of evaluation. AOx4 at time of evaluation. Pt reports that prior to November he was ambulating full facility distances with a quad cane at [facility] where is a LTC resident. Since November he has had nonhealing ulcers with severe [name] pain which has made him transfers only to wheelchair    PT Comments    Pt demonstrated good tolerance to session, demonstrated decreased assistance for bed mobility and was able to roll to R side to participate in sidelying therex.  Pt required modA x 2 for sit to/from stand transfers with cues for PHP and to maintain balance.  He was able to hop fwd/bkwd x 4 with mod A x 2 for support with no LOB noted.  He participated in seated and sidelying therex as noted and would con't to benefit from skilled PT to improve strength, balance and safety for improved functional mobility  Follow Up Recommendations  SNF     Equipment Recommendations  None recommended by PT    Recommendations for Other Services Rehab consult     Precautions / Restrictions Precautions Precautions: Fall Restrictions Weight Bearing Restrictions: No    Mobility  Bed Mobility Overal bed mobility: Modified Independent             General bed mobility comments: Features used add'l time required  Transfers Overall transfer level: Needs assistance Equipment used: Rolling walker (2 wheeled) Transfers: Sit to/from Stand Sit to Stand: Min assist         General transfer comment: Pt required cues for PHP  Ambulation/Gait Ambulation/Gait assistance: Mod assist;+2 physical assistance Ambulation  Distance (Feet): 4 Feet Assistive device: Rolling walker (2 wheeled)       General Gait Details: Pt able to hop fwd and backwards demonstrating fair balance   Stairs            Wheelchair Mobility    Modified Rankin (Stroke Patients Only)       Balance Overall balance assessment: Needs assistance Sitting-balance support: No upper extremity supported Sitting balance-Leahy Scale: Good     Standing balance support: Bilateral upper extremity supported Standing balance-Leahy Scale: Poor Standing balance comment: +2 for maintaining balance with use of RW                     Cognition Arousal/Alertness: Awake/alert Behavior During Therapy: WFL for tasks assessed/performed Overall Cognitive Status: Within Functional Limits for tasks assessed                      Exercises Other Exercises Other Exercises: Sitting at EOB, LAQ, pillow squeeze, hip flexion x 20 b/l, sidelying L hip ext x 10 with AA     General Comments        Pertinent Vitals/Pain Pain Assessment: 0-10 Pain Score: 7  Pain Location: [name] residual limb Pain Intervention(s): Limited activity within patient's tolerance;Monitored during session;Patient requesting pain meds-RN notified    Home Living                      Prior Function            PT  Goals (current goals can now be found in the care plan section) Acute Rehab PT Goals Patient Stated Goal: Return to walking PT Goal Formulation: With patient/family Time For Goal Achievement: 10/04/15 Potential to Achieve Goals: Fair    Frequency  7X/week    PT Plan      Co-evaluation             End of Session Equipment Utilized During Treatment: Gait belt Activity Tolerance: Patient tolerated treatment well;Patient limited by pain Patient left: in bed;with call bell/phone within reach     Time: 1530-1555 PT Time Calculation (min) (ACUTE ONLY): 25 min  Charges:  $Gait Training: 8-22 mins $Therapeutic Exercise:  8-22 mins                    G Codes:      Braydyn Schultes 2015/10/21, 4:52 PM Ely Ballen, PTA

## 2015-09-21 NOTE — Progress Notes (Signed)
[facility]  Daily Progress Note   Subjective  - 2 Days Post-Op  Pain control a little better No events overnight Working well with PT  Objective Filed Vitals:   09/20/15 1059 09/20/15 2103 09/21/15 0459 09/21/15 0500  BP: 119/56 132/68 136/93   Pulse: 72 92 87   Temp:  98.9 F (37.2 C) 98.2 F (36.8 C)   TempSrc:  Oral Oral   Resp:  16 18   Height:      Weight:    86.592 kg (190 lb 14.4 oz)  SpO2:  94% 98%     Intake/Output Summary (Last 24 hours) at 09/21/15 0839 Last data filed at 09/21/15 0459  Gross per 24 hour  Intake   1200 ml  Output    725 ml  Net    475 ml    PULM  CTAB CV  RRR VASC  Dressing C/D/I. Knee able to straighten well  Laboratory CBC    Component Value Date/Time   WBC 15.9* 09/20/2015 0459   WBC 11.1* 12/01/2013 1317   HGB 9.7* 09/20/2015 0459   HGB 14.3 12/01/2013 1317   HCT 30.0* 09/20/2015 0459   HCT 43.9 12/01/2013 1317   PLT 403 09/20/2015 0459   PLT 250 12/01/2013 1317    BMET    Component Value Date/Time   NA 140 09/20/2015 0459   NA 136 12/01/2013 1317   K 3.8 09/20/2015 0459   K 3.9 12/01/2013 1317   CL 109 09/20/2015 0459   CL 100 12/01/2013 1317   CO2 24 09/20/2015 0459   CO2 33* 12/01/2013 1317   GLUCOSE 98 09/20/2015 0459   GLUCOSE 91 12/01/2013 1317   BUN 17 09/20/2015 0459   BUN 24* 09/25/2014 0846   CREATININE 1.10 09/20/2015 0459   CREATININE 1.29 09/25/2014 0846   CALCIUM 8.7* 09/20/2015 0459   CALCIUM 9.4 12/01/2013 1317   GFRNONAA >60 09/20/2015 0459   GFRNONAA 60* 09/25/2014 0846   GFRNONAA >60 03/16/2014 0750   GFRAA >60 09/20/2015 0459   GFRAA >60 09/25/2014 0846   GFRAA >60 03/16/2014 0750    Assessment/Planning: POD #2 s/p left BKA   Dressing can come off tomorrow or Sunday  Can go back to SNF either this weekend or first of the week depending on how he does with PT and bed availability  Will need follow up visit in 3 weeks for staple  removal    Herny Scurlock  09/21/2015, 8:39 AM

## 2015-09-21 NOTE — Progress Notes (Deleted)
Patient discharged to home. Discharge instructions given to patient and her husband. Patient instructed to keep follow up appts. as ordered. Prescriptions given as ordered. No acute distress noted. Patient taken out via wheelchair to awaiting car.

## 2015-09-21 NOTE — Progress Notes (Signed)
[facility] at [place] NAME: Timothy Juarez    MR#:  [id number]  DATE OF BIRTH:  09-Dec-1950  SUBJECTIVE:  Complaints of pain this morning at surgery site described only as pain intensity 6/10 worse with movement and relieved with pain medications Work physical therapy yesterday  REVIEW OF SYSTEMS:  CONSTITUTIONAL: No fever, fatigue or weakness.  EYES: No blurred or double vision.  EARS, NOSE, AND THROAT: No tinnitus or ear pain.  RESPIRATORY: No cough, shortness of breath, wheezing or hemoptysis.  CARDIOVASCULAR: No chest pain, orthopnea, edema.  GASTROINTESTINAL: No nausea, vomiting, diarrhea or abdominal pain.  GENITOURINARY: No dysuria, hematuria.  ENDOCRINE: No polyuria, nocturia,  HEMATOLOGY: No anemia, easy bruising or bleeding SKIN: No rash or lesion. MUSCULOSKELETAL: No joint pain or arthritis.   NEUROLOGIC: No tingling, numbness, weakness.  PSYCHIATRY: No anxiety or depression.   DRUG ALLERGIES:  No Known Allergies  VITALS:  Blood pressure 140/90, pulse 87, temperature 98.2 F (36.8 C), temperature source Oral, resp. rate 18, height 6\' 1"  (1.854 m), weight 86.592 kg (190 lb 14.4 oz), SpO2 98 %.  PHYSICAL EXAMINATION:  VITAL SIGNS: Filed Vitals:   09/21/15 0459 09/21/15 0932  BP: 136/93 140/90  Pulse: 87   Temp: 98.2 F (36.8 C)   Resp: 18    GENERAL:64 y.o.male currently in no acute distress.  HEAD: Normocephalic, atraumatic.  EYES: Pupils equal, round, reactive to light. Extraocular muscles intact. No scleral icterus.  MOUTH: Moist mucosal membrane. Dentition intact. No abscess noted.  EAR, NOSE, THROAT: Clear without exudates. No external lesions.  NECK: Supple. No thyromegaly. No nodules. No JVD.  PULMONARY: Clear to ascultation, without wheeze rails or rhonci. No use of accessory muscles, Good respiratory effort. good air entry bilaterally CHEST: Nontender to palpation.  CARDIOVASCULAR: S1 and S2. Regular  rate and rhythm. No murmurs, rubs, or gallops. No edema. Pedal pulses 2+ bilaterally.  GASTROINTESTINAL: Soft, nontender, nondistended. No masses. Positive bowel sounds. No hepatosplenomegaly.  MUSCULOSKELETAL: Status post left BKA NEUROLOGIC: No gross focal neurological deficits.  SKIN: No ulceration, lesions, rashes, or cyanosis. Skin warm and dry. Turgor intact.  PSYCHIATRIC: Mood, affect within normal limits. The patient is awake, alert and oriented x 3. Insight, judgment intact.      LABORATORY PANEL:   CBC  Recent Labs Lab 09/20/15 0459  WBC 15.9*  HGB 9.7*  HCT 30.0*  PLT 403   ------------------------------------------------------------------------------------------------------------------  Chemistries   Recent Labs Lab 09/19/15 0413 09/20/15 0459  NA 138 140  K 3.7 3.8  CL 106 109  CO2 23 24  GLUCOSE 94 98  BUN 20 17  CREATININE 1.15 1.10  CALCIUM 9.0 8.7*  MG 2.2  --    ------------------------------------------------------------------------------------------------------------------  Cardiac Enzymes No results for input(s): TROPONINI in the last 168 hours. ------------------------------------------------------------------------------------------------------------------  RADIOLOGY:  No results found.  EKG:   Orders placed or performed in visit on 03/21/13  . EKG 12-Lead    ASSESSMENT AND PLAN:   65 year old African American gentleman history of essential hypertension, peripheral arterial disease, COPD non-option requiring admitted 09/14/2015 for nonhealing ulcers now postop day 1 left below-knee amputation   Assessment/Plan: 1. Essential hypertension- Currently blood pressure is stable. Continue metoprolol Lasix  2. Chronic history of congestive heart failure diastolic, ef [id number] Currently not fluid overloaded  3. Chronic history of COPD with past medical history of smoking No COPD exacerbation at this time. Continue Dulera and  Spiriva  4. Nonhealing left lower extremity ulcers with  peripheral artery disease Management by vascular and podiatry,  Postop day 2 left below-knee amputation Morphine IV as needed for severe pain  Disposition suspect discharge soon     All the records are reviewed and case discussed with Care Management/Social Workerr. Management plans discussed with the patient, family and they are in agreement.  CODE STATUS: full  TOTAL TIME TAKING CARE OF THIS PATIENT: 28 minutes.   POSSIBLE D/C IN 1 DAYS, DEPENDING ON CLINICAL CONDITION.   Hower,  Karenann Cai.D on 09/21/2015 at 12:32 PM  Between 7am to 6pm - Pager - [phone]  After 6pm: House Pager: - [phone]  [facility]  Office  [phone]  CC: Primary care physician; Marisa Hua, MD

## 2015-09-22 NOTE — Progress Notes (Signed)
[facility] at [place] NAME: Timothy Juarez    MR#:  [id number]  DATE OF BIRTH:  02-14-1951  SUBJECTIVE:  No complaints of significant pain or discomfort, feels okay Work physical therapy yesterday skilled nursing facility placement was recommended,   REVIEW OF SYSTEMS:  CONSTITUTIONAL: No fever, fatigue or weakness.  EYES: No blurred or double vision.  EARS, NOSE, AND THROAT: No tinnitus or ear pain.  RESPIRATORY: No cough, shortness of breath, wheezing or hemoptysis.  CARDIOVASCULAR: No chest pain, orthopnea, edema.  GASTROINTESTINAL: No nausea, vomiting, diarrhea or abdominal pain.  GENITOURINARY: No dysuria, hematuria.  ENDOCRINE: No polyuria, nocturia,  HEMATOLOGY: No anemia, easy bruising or bleeding SKIN: No rash or lesion. MUSCULOSKELETAL: No joint pain or arthritis.   NEUROLOGIC: No tingling, numbness, weakness.  PSYCHIATRY: No anxiety or depression.   DRUG ALLERGIES:  No Known Allergies  VITALS:  Blood pressure 112/68, pulse 81, temperature 98.2 F (36.8 C), temperature source Oral, resp. rate 16, height 6\' 1"  (1.854 m), weight 87.136 kg (192 lb 1.6 oz), SpO2 95 %.  PHYSICAL EXAMINATION:  VITAL SIGNS: Filed Vitals:   09/22/15 0534 09/22/15 1323  BP: 128/57 112/68  Pulse: 72 81  Temp: 98.1 F (36.7 C) 98.2 F (36.8 C)  Resp: 16 16   GENERAL:65 y.o.male currently in no acute distress.  HEAD: Normocephalic, atraumatic.  EYES: Pupils equal, round, reactive to light. Extraocular muscles intact. No scleral icterus.  MOUTH: Moist mucosal membrane. Dentition intact. No abscess noted.  EAR, NOSE, THROAT: Clear without exudates. No external lesions.  NECK: Supple. No thyromegaly. No nodules. No JVD.  PULMONARY: Clear to ascultation, without wheeze rails or rhonci. No use of accessory muscles, Good respiratory effort. good air entry bilaterally CHEST: Nontender to palpation.  CARDIOVASCULAR: S1 and S2. Regular rate  and rhythm. No murmurs, rubs, or gallops. No edema. Pedal pulses 2+ bilaterally.  GASTROINTESTINAL: Soft, nontender, nondistended. No masses. Positive bowel sounds. No hepatosplenomegaly.  MUSCULOSKELETAL: Status post left BKA NEUROLOGIC: No gross focal neurological deficits.  SKIN: No ulceration, lesions, rashes, or cyanosis. Skin warm and dry. Turgor intact.  PSYCHIATRIC: Mood, affect within normal limits. The patient is awake, alert and oriented x 3. Insight, judgment intact.      LABORATORY PANEL:   CBC  Recent Labs Lab 09/20/15 0459  WBC 15.9*  HGB 9.7*  HCT 30.0*  PLT 403   ------------------------------------------------------------------------------------------------------------------  Chemistries   Recent Labs Lab 09/19/15 0413 09/20/15 0459  NA 138 140  K 3.7 3.8  CL 106 109  CO2 23 24  GLUCOSE 94 98  BUN 20 17  CREATININE 1.15 1.10  CALCIUM 9.0 8.7*  MG 2.2  --    ------------------------------------------------------------------------------------------------------------------  Cardiac Enzymes No results for input(s): TROPONINI in the last 168 hours. ------------------------------------------------------------------------------------------------------------------  RADIOLOGY:  No results found.  EKG:   Orders placed or performed in visit on 03/21/13  . EKG 12-Lead    ASSESSMENT AND PLAN:   65 year old African American gentleman history of essential hypertension, peripheral arterial disease, COPD non-option requiring admitted 09/14/2015 for nonhealing ulcers now postop day 1 left below-knee amputation   Assessment/Plan: 1. Essential hypertension- Currently blood pressure is stable. Continue metoprolol Lasix  2. Chronic diastolic congestive heart  failure , ef 60% Currently not fluid overloaded  3. Chronic history of COPD with past medical history of smoking No COPD exacerbation at this time. Continue Dulera and Spiriva  4. Nonhealing  left lower extremity ulcers with peripheral artery disease,  gangrene , status post BKA  Management by vascular and podiatry,  Postop day 3 left below-knee amputation, Oxycodone orally as needed for severe painPhysical therapist recommended skilled nursing facility placement, likely next week .   Disposition suspect discharge soon     All the records are reviewed and case discussed with Care Management/Social Workerr. Management plans discussed with the patient, family and they are in agreement.  CODE STATUS: full  TOTAL TIME TAKING CARE OF THIS PATIENT:35 minutes .   POSSIBLE D/C IN 1-2 DAYS, DEPENDING ON CLINICAL CONDITION.   [name] M.D on 09/22/2015 at 1:26 PM  Between 7am to 6pm - Pager - [phone]  After 6pm: House Pager: - [phone]  [facility]  Office  [phone]  CC: Primary care physician; Marisa Hua, MD

## 2015-09-22 NOTE — Progress Notes (Signed)
Physical Therapy Treatment Patient Details Name: Timothy Juarez MRN: [id number] DOB: 05-Mar-1951 Today's Date: 09/22/2015    History of Present Illness The past medical history of severe peripheral arterial disease was admitted to vascular surgery service for left lower extent the pain and nonhealing ulcers. Pt underwent L BKA secondary t o L foot gangrene. Pt is POD#1 at time of evaluation. AOx4 at time of evaluation. Pt reports that prior to November he was ambulating full facility distances with a quad cane at [facility] where is a LTC resident. Since November he has had nonhealing ulcers with severe [name] pain which has made him transfers only to wheelchair    PT Comments    Patient demonstrates improved tolerance for standing transfers and attempts at mobility. At this time he is too limited by pain as well as weakness in RLE and UEs to attempt any further mobility than at the edge of the bed. He is able to complete scooting relatively easily, indicating he is likely most limited by pain with standing at this time. He is progressing with total time in standing as well as general RLE strength, and would continue to benefit from skilled PT services to address the listed mobility deficits.   Follow Up Recommendations  SNF     Equipment Recommendations  Rolling walker with 5" wheels    Recommendations for Other Services Rehab consult     Precautions / Restrictions Precautions Precautions: Fall Restrictions Weight Bearing Restrictions: No    Mobility  Bed Mobility Overal bed mobility: Modified Independent             General bed mobility comments: Patient relies heavily on UEs as he demonstrates RLE weakness.   Transfers Overall transfer level: Needs assistance Equipment used: Rolling walker (2 wheeled) Transfers: Sit to/from Stand Sit to Stand: Min assist;Mod assist;From elevated surface         General transfer comment: Patient required cuing for hand  placement, leaning forward, and elevated surface to complete.   Ambulation/Gait Ambulation/Gait assistance: Min guard Ambulation Distance (Feet): 4 Feet Assistive device: Rolling walker (2 wheeled)       General Gait Details: Lateral "hopping' performed x 3 steps in each direction, patient required cga x 1 for balance and cuing for minimal hop lengths.    Stairs            Wheelchair Mobility    Modified Rankin (Stroke Patients Only)       Balance Overall balance assessment: Needs assistance Sitting-balance support: No upper extremity supported Sitting balance-Leahy Scale: Good     Standing balance support: Bilateral upper extremity supported Standing balance-Leahy Scale: Fair Standing balance comment: No physical assistance required for maintaining balance from PT.                     Cognition Arousal/Alertness: Awake/alert Behavior During Therapy: WFL for tasks assessed/performed Overall Cognitive Status: Within Functional Limits for tasks assessed                      Exercises General Exercises - Lower Extremity Long Arc Quad: AROM;Right;10 reps Hip ABduction/ADduction: AROM;Both;10 reps Straight Leg Raises: AROM;Right;10 reps Hip Flexion/Marching: AROM;Right;10 reps;Seated Other Exercises Other Exercises: Scooting at EOB in sitting completed 2 sets of 2 repetitions with use of UEs and RLE. Bridging in supine with use of UEs and RLE to bring hips off bed.     General Comments        Pertinent Vitals/Pain  Pain Assessment: Faces Faces Pain Scale: Hurts whole lot Pain Location: [name] residual limb. Pain Descriptors / Indicators: Aching;Throbbing Pain Intervention(s): Limited activity within patient's tolerance;Monitored during session;Repositioned;Patient requesting pain meds-RN notified    Home Living                      Prior Function            PT Goals (current goals can now be found in the care plan section) Acute  Rehab PT Goals Patient Stated Goal: Return to walking PT Goal Formulation: With patient/family Time For Goal Achievement: 10/04/15 Potential to Achieve Goals: Fair Progress towards PT goals: Progressing toward goals    Frequency  7X/week    PT Plan Current plan remains appropriate    Co-evaluation             End of Session Equipment Utilized During Treatment: Gait belt Activity Tolerance: Patient tolerated treatment well;Patient limited by pain Patient left: in bed;with call bell/phone within reach     Time: 1347-1413 PT Time Calculation (min) (ACUTE ONLY): 26 min  Charges:  $Gait Training: 8-22 mins $Therapeutic Exercise: 8-22 mins                     G Codes:      Kerman Passey, PT, DPT    09/22/2015, 5:59 PM

## 2015-09-22 NOTE — Progress Notes (Signed)
09/22/2015  10:59 AM  Soap suds ordered for pt, but he had a small BM.  Pt desires to wait until later in the day to take the enema to see if he can have a larger BM first.  [name], RN

## 2015-09-23 LAB — CBC
HCT: 29.6 % — ABNORMAL LOW (ref 40.0–52.0)
Hemoglobin: 9.4 g/dL — ABNORMAL LOW (ref 13.0–18.0)
MCH: 23.1 pg — AB (ref 26.0–34.0)
MCHC: 31.9 g/dL — ABNORMAL LOW (ref 32.0–36.0)
MCV: 72.4 fL — AB (ref 80.0–100.0)
PLATELETS: 415 10*3/uL (ref 150–440)
RBC: 4.09 MIL/uL — ABNORMAL LOW (ref 4.40–5.90)
RDW: 16.7 % — AB (ref 11.5–14.5)
WBC: 18.5 10*3/uL — AB (ref 3.8–10.6)

## 2015-09-23 NOTE — Clinical Social Work Note (Signed)
Clinical Social Work Assessment  Patient Details  Name: Timothy Juarez MRN: [id number] Date of Birth: 02-Dec-1950  Date of referral:  09/23/15               Reason for consult:   (from [facility])                Permission sought to share information with:  Facility Art therapist granted to share information::  Yes, Verbal Permission Granted  Name::        Agency::     Relationship::     Contact Information:     Housing/Transportation Living arrangements for the past 2 months:  [place] of Information:  Patient, Medical Team Patient Interpreter Needed:  None Criminal Activity/Legal Involvement Pertinent to Current Situation/Hospitalization:    Significant Relationships:  Spouse, Adult Children Lives with:  Facility Resident Do you feel safe going back to the place where you live?  Yes Need for family participation in patient care:  No (Coment)  Care giving concerns:  None at this time   Facilities manager / plan:  Holiday representative ([place]) consult, patient is from [place].   Patient was alert, oriented  and engaged in conversation with CSW. CSW introduced self and explained role of CSW department.  Patient states he currently lives at [place] and plans to return at discharge.  He has lived there for about three years.  Patient's support system is his wife who lives local.  Patient states prior level of function he used a walker but will now use both wheel chair and walker.  Call to [facility] to confirm patient is a resident and able to return.     CSW will complete FL2, in anticipation of patient returning to [facility] at discharge.       Employment status:  Disabled (Comment on whether or not currently receiving Disability) Insurance information:  Medicaid In [place] PT Recommendations:  [place] / Referral to community resources:  [place]  Patient/Family's Response to care:  Patient was appreciative of talking with CSW.  Patient/Family's Understanding of and Emotional Response to Diagnosis, Current Treatment, and Prognosis:  Patient understands that he is under continued medical work up at this time.  Once medically stable he will discharge back to [facility].  Emotional Assessment Appearance:  Appears stated age Attitude/Demeanor/Rapport:    Affect (typically observed):  Accepting, Adaptable, Calm, Pleasant, Appropriate Orientation:  Oriented to Self, Oriented to Place, Oriented to  Time, Oriented to Situation Alcohol / Substance use:    Psych involvement (Current and /or in the community):  No (Comment)  Discharge Needs  Concerns to be addressed:  Care Coordination, Discharge Planning Concerns Readmission within the last 30 days:  No Current discharge risk:  Chronically ill, Dependent with Mobility Barriers to Discharge:  Continued Medical Work up, No Barriers Identified   Maurine Cane, LCSW 09/23/2015, 4:17 PM

## 2015-09-23 NOTE — Progress Notes (Signed)
Physical Therapy Treatment Patient Details Name: Timothy Juarez MRN: [id number] DOB: 1950-09-08 Today's Date: 09/23/2015    History of Present Illness The past medical history of severe peripheral arterial disease was admitted to vascular surgery service for left lower extent the pain and nonhealing ulcers. Pt underwent L BKA secondary t o L foot gangrene. Pt is POD#1 at time of evaluation. AOx4 at time of evaluation. Pt reports that prior to November he was ambulating full facility distances with a quad cane at [facility] where is a LTC resident. Since November he has had nonhealing ulcers with severe [name] pain which has made him transfers only to wheelchair    PT Comments    Patient tolerated his exercises well today, but is limited by pain in his [name]. He was I with bed mobility, but did need minA for sit to stand transfers due to balance and wanting to lean towards his [name]. He did have to sit after <30 seconds of standing due to increased pain in his [name]. Spoke with RN about increased pain following treatment.   Follow Up Recommendations  SNF     Equipment Recommendations  Rolling walker with 5" wheels    Recommendations for Other Services       Precautions / Restrictions Precautions Precautions: Fall Restrictions Weight Bearing Restrictions: No    Mobility  Bed Mobility Overal bed mobility: Independent             General bed mobility comments: Patient relies heavily on UEs as he demonstrates RLE weakness.   Transfers Overall transfer level: Needs assistance Equipment used: Standard walker Transfers: Sit to/from Stand Sit to Stand: Min assist         General transfer comment: Bed elevated slightly; pt. did appear annoyed when PT was holding gait belt-he felt that PT was helping him and he was going backwards instead of standing up. PT reassured pt. that she was just holding for safety and wouldn't physically help until he needed  it  Ambulation/Gait Ambulation/Gait assistance:  (Unable due to increased [name] pain)               Stairs            Wheelchair Mobility    Modified Rankin (Stroke Patients Only)       Balance Overall balance assessment: Needs assistance (with standing balance) Sitting-balance support: No upper extremity supported Sitting balance-Leahy Scale: Good     Standing balance support: Bilateral upper extremity supported Standing balance-Leahy Scale: Fair Standing balance comment: No physical assistance needed for maintaining balance from PT; increased assistance needed when going from sitting to standing due to decreased balance/needing to shift more towards RLE                    Cognition Arousal/Alertness: Awake/alert Behavior During Therapy: WFL for tasks assessed/performed Overall Cognitive Status: Within Functional Limits for tasks assessed                      Exercises General Exercises - Lower Extremity Hip ABduction/ADduction: Strengthening;Both;10 reps Straight Leg Raises: Strengthening;Both;10 reps Other Exercises Other Exercises:  (quad sets 10x each leg, strengthening)    General Comments        Pertinent Vitals/Pain Pain Assessment: No/denies pain Pain Score: 0-No pain (reports that his pain will increase in his [name] with standing)    Home Living  Prior Function            PT Goals (current goals can now be found in the care plan section) Acute Rehab PT Goals Potential to Achieve Goals: Fair Progress towards PT goals: Progressing toward goals    Frequency  7X/week    PT Plan Current plan remains appropriate    Co-evaluation             End of Session Equipment Utilized During Treatment: Gait belt Activity Tolerance: Patient limited by pain Patient left: with bed alarm set     Time: 1428-1440 PT Time Calculation (min) (ACUTE ONLY): 12 min  Charges:  $Therapeutic Exercise: 8-22  mins                    G Codes:      Timothy Juarez,PT, CWCE 09/23/2015, 3:45 PM

## 2015-09-23 NOTE — Progress Notes (Signed)
    Subjective  -   Pain controlled   Physical Exam:  Dressing dry       Assessment/Plan:  S/p L BKA  Pt doing well with PT Pain controlled on oral meds Hopefully to rehab soon  Timothy Juarez 09/23/2015 4:39 PM --  Filed Vitals:   09/23/15 0545 09/23/15 1259  BP: 128/61 100/61  Pulse: 85 86  Temp: 98.9 F (37.2 C) 97.7 F (36.5 C)  Resp: 17 19    Intake/Output Summary (Last 24 hours) at 09/23/15 1639 Last data filed at 09/23/15 1001  Gross per 24 hour  Intake    300 ml  Output      1 ml  Net    299 ml     Laboratory CBC    Component Value Date/Time   WBC 18.5* 09/23/2015 0623   WBC 11.1* 12/01/2013 1317   HGB 9.4* 09/23/2015 0623   HGB 14.3 12/01/2013 1317   HCT 29.6* 09/23/2015 0623   HCT 43.9 12/01/2013 1317   PLT 415 09/23/2015 0623   PLT 250 12/01/2013 1317    BMET    Component Value Date/Time   NA 140 09/20/2015 0459   NA 136 12/01/2013 1317   K 3.8 09/20/2015 0459   K 3.9 12/01/2013 1317   CL 109 09/20/2015 0459   CL 100 12/01/2013 1317   CO2 24 09/20/2015 0459   CO2 33* 12/01/2013 1317   GLUCOSE 98 09/20/2015 0459   GLUCOSE 91 12/01/2013 1317   BUN 17 09/20/2015 0459   BUN 24* 09/25/2014 0846   CREATININE 1.10 09/20/2015 0459   CREATININE 1.29 09/25/2014 0846   CALCIUM 8.7* 09/20/2015 0459   CALCIUM 9.4 12/01/2013 1317   GFRNONAA >60 09/20/2015 0459   GFRNONAA 60* 09/25/2014 0846   GFRNONAA >60 03/16/2014 0750   GFRAA >60 09/20/2015 0459   GFRAA >60 09/25/2014 0846   GFRAA >60 03/16/2014 0750    COAG Lab Results  Component Value Date   INR 1.38 09/14/2015   No results found for: PTT  Antibiotics Anti-infectives    Start     Dose/Rate Route Frequency Ordered Stop   09/19/15 1715  ceFAZolin (ANCEF) IVPB 1 g/50 mL premix    Comments:  Send with pt to OR   1 g 100 mL/hr over 30 Minutes Intravenous 3 times per day 09/19/15 1709 09/20/15 1359   09/14/15 1400  piperacillin-tazobactam (ZOSYN) IVPB 3.375 g  Status:   Discontinued     3.375 g 12.5 mL/hr over 240 Minutes Intravenous 3 times per day 09/14/15 1218 09/19/15 1709   09/14/15 1200  piperacillin-tazobactam (ZOSYN) IVPB 3.375 g  Status:  Discontinued     3.375 g 12.5 mL/hr over 240 Minutes Intravenous 4 times per day 09/14/15 1145 09/14/15 1218       V. Leia Alf, M.D. [facility] Office: [phone] Pager:  [phone]

## 2015-09-23 NOTE — Progress Notes (Addendum)
[facility] at [place] NAME: Timothy Juarez    MR#:  [id number]  DATE OF BIRTH:  [birth year]  SUBJECTIVE:  No complaints of significant pain or discomfort, feels okay Work physical therapy yesterday skilled nursing facility placement was recommended,  Patient feels good today. Denies any significant pain. He is not sure when his dressing change will be done., She does not know when he is supposed to go to skilled nursing facility  REVIEW OF SYSTEMS:  CONSTITUTIONAL: No fever, fatigue or weakness.  EYES: No blurred or double vision.  EARS, NOSE, AND THROAT: No tinnitus or ear pain.  RESPIRATORY: No cough, shortness of breath, wheezing or hemoptysis.  CARDIOVASCULAR: No chest pain, orthopnea, edema.  GASTROINTESTINAL: No nausea, vomiting, diarrhea or abdominal pain.  GENITOURINARY: No dysuria, hematuria.  ENDOCRINE: No polyuria, nocturia,  HEMATOLOGY: No anemia, easy bruising or bleeding SKIN: No rash or lesion. MUSCULOSKELETAL: No joint pain or arthritis.   NEUROLOGIC: No tingling, numbness, weakness.  PSYCHIATRY: No anxiety or depression.   DRUG ALLERGIES:  No Known Allergies  VITALS:  Blood pressure 100/61, pulse 86, temperature 97.7 F (36.5 C), temperature source Oral, resp. rate 19, height 6\' 1"  (1.854 m), weight 87 kg (191 lb 12.8 oz), SpO2 94 %.  PHYSICAL EXAMINATION:  VITAL SIGNS: Filed Vitals:   09/23/15 0545 09/23/15 1259  BP: 128/61 100/61  Pulse: 85 86  Temp: 98.9 F (37.2 C) 97.7 F (36.5 C)  Resp: 17 19   GENERAL:65 y.o.male currently in no acute distress.  HEAD: Normocephalic, atraumatic.  EYES: Pupils equal, round, reactive to light. Extraocular muscles intact. No scleral icterus.  MOUTH: Moist mucosal membrane. Dentition intact. No abscess noted.  EAR, NOSE, THROAT: Clear without exudates. No external lesions.  NECK: Supple. No thyromegaly. No nodules. No JVD.  PULMONARY: Clear to ascultation, without  wheeze rails or rhonci. No use of accessory muscles, Good respiratory effort. good air entry bilaterally CHEST: Nontender to palpation.  CARDIOVASCULAR: S1 and S2. Regular rate and rhythm. No murmurs, rubs, or gallops. No edema. Pedal pulses 2+ bilaterally.  GASTROINTESTINAL: Soft, nontender, nondistended. No masses. Positive bowel sounds. No hepatosplenomegaly.  MUSCULOSKELETAL: Status post left BKA NEUROLOGIC: No gross focal neurological deficits.  SKIN: No ulceration, lesions, rashes, or cyanosis. Skin warm and dry. Turgor intact.  PSYCHIATRIC: Mood, affect within normal limits. The patient is awake, alert and oriented x 3. Insight, judgment intact.      LABORATORY PANEL:   CBC  Recent Labs Lab 09/23/15 0623  WBC 18.5*  HGB 9.4*  HCT 29.6*  PLT 415   ------------------------------------------------------------------------------------------------------------------  Chemistries   Recent Labs Lab 09/19/15 0413 09/20/15 0459  NA 138 140  K 3.7 3.8  CL 106 109  CO2 23 24  GLUCOSE 94 98  BUN 20 17  CREATININE 1.15 1.10  CALCIUM 9.0 8.7*  MG 2.2  --    ------------------------------------------------------------------------------------------------------------------  Cardiac Enzymes No results for input(s): TROPONINI in the last 168 hours. ------------------------------------------------------------------------------------------------------------------  RADIOLOGY:  No results found.  EKG:   Orders placed or performed in visit on 03/21/13  . EKG 12-Lead    ASSESSMENT AND PLAN:   65 year old African American gentleman history of essential hypertension, peripheral arterial disease, COPD non-option requiring admitted 09/14/2015 for nonhealing ulcers now postop day 1 left below-knee amputation   Assessment/Plan: 1. Essential hypertension- Currently blood pressure is well controlled. Continue metoprolol Lasix  2. Chronic diastolic congestive heart  failure ,  ef 60% Currently not fluid  overloaded. Continue Lasix as well as metoprolol  3. Chronic history of COPD with past medical history of smoking No COPD exacerbation at this time. Continue Dulera and Spiriva  4. Nonhealing left lower extremity ulcers with peripheral artery disease, gangrene , status post BKA  Management by vascular and podiatry,  Postop day 4 left below-knee amputation, Oxycodone orally as needed for severe painPhysical therapist recommended skilled nursing facility placement, likely next week .  5. Leukocytosis, etiology is unclear. Patient is afebrile, he is off antibiotic as he is status post BKA, getting CBC tomorrow.  Morning. Obtain blood cultures if patient becomes febrile Disposition suspect discharge soon     All the records are reviewed and case discussed with Care Management/Social Workerr. Management plans discussed with the patient, family and they are in agreement.  CODE STATUS: full  TOTAL TIME TAKING CARE OF THIS PATIENT:30 minutes .   POSSIBLE D/C IN 1-2 DAYS, DEPENDING ON CLINICAL CONDITION.   [name] M.D on 09/23/2015 at 1:07 PM  Between 7am to 6pm - Pager - [phone]  After 6pm: House Pager: - [phone]  [facility]  Office  [phone]  CC: Primary care physician; Marisa Hua, MD

## 2015-09-23 NOTE — Progress Notes (Signed)
    Subjective  -   No complaints   Physical Exam:  Dressing changed Incision looks good       Assessment/Plan:    Continue post op care PT/OT  [name] 09/23/2015 4:41 PM --  Filed Vitals:   09/23/15 0545 09/23/15 1259  BP: 128/61 100/61  Pulse: 85 86  Temp: 98.9 F (37.2 C) 97.7 F (36.5 C)  Resp: 17 19    Intake/Output Summary (Last 24 hours) at 09/23/15 1641 Last data filed at 09/23/15 1001  Gross per 24 hour  Intake    300 ml  Output      1 ml  Net    299 ml     Laboratory CBC    Component Value Date/Time   WBC 18.5* 09/23/2015 0623   WBC 11.1* 12/01/2013 1317   HGB 9.4* 09/23/2015 0623   HGB 14.3 12/01/2013 1317   HCT 29.6* 09/23/2015 0623   HCT 43.9 12/01/2013 1317   PLT 415 09/23/2015 0623   PLT 250 12/01/2013 1317    BMET    Component Value Date/Time   NA 140 09/20/2015 0459   NA 136 12/01/2013 1317   K 3.8 09/20/2015 0459   K 3.9 12/01/2013 1317   CL 109 09/20/2015 0459   CL 100 12/01/2013 1317   CO2 24 09/20/2015 0459   CO2 33* 12/01/2013 1317   GLUCOSE 98 09/20/2015 0459   GLUCOSE 91 12/01/2013 1317   BUN 17 09/20/2015 0459   BUN 24* 09/25/2014 0846   CREATININE 1.10 09/20/2015 0459   CREATININE 1.29 09/25/2014 0846   CALCIUM 8.7* 09/20/2015 0459   CALCIUM 9.4 12/01/2013 1317   GFRNONAA >60 09/20/2015 0459   GFRNONAA 60* 09/25/2014 0846   GFRNONAA >60 03/16/2014 0750   GFRAA >60 09/20/2015 0459   GFRAA >60 09/25/2014 0846   GFRAA >60 03/16/2014 0750    COAG Lab Results  Component Value Date   INR 1.38 09/14/2015   No results found for: PTT  Antibiotics Anti-infectives    Start     Dose/Rate Route Frequency Ordered Stop   09/19/15 1715  ceFAZolin (ANCEF) IVPB 1 g/50 mL premix    Comments:  Send with pt to OR   1 g 100 mL/hr over 30 Minutes Intravenous 3 times per day 09/19/15 1709 09/20/15 1359   09/14/15 1400  piperacillin-tazobactam (ZOSYN) IVPB 3.375 g  Status:  Discontinued     3.375 g 12.5 mL/hr  over 240 Minutes Intravenous 3 times per day 09/14/15 1218 09/19/15 1709   09/14/15 1200  piperacillin-tazobactam (ZOSYN) IVPB 3.375 g  Status:  Discontinued     3.375 g 12.5 mL/hr over 240 Minutes Intravenous 4 times per day 09/14/15 1145 09/14/15 1218       V. Leia Alf, M.D. [facility] Office: [phone] Pager:  [phone]

## 2015-09-24 LAB — COMPREHENSIVE METABOLIC PANEL
ALBUMIN: 2.8 g/dL — AB (ref 3.5–5.0)
ALK PHOS: 117 U/L (ref 38–126)
ALT: 39 U/L (ref 17–63)
AST: 75 U/L — AB (ref 15–41)
Anion gap: 7 (ref 5–15)
BUN: 19 mg/dL (ref 6–20)
CALCIUM: 8.8 mg/dL — AB (ref 8.9–10.3)
CO2: 26 mmol/L (ref 22–32)
CREATININE: 0.88 mg/dL (ref 0.61–1.24)
Chloride: 105 mmol/L (ref 101–111)
GFR calc non Af Amer: >60 mL/min (ref >60)
GLUCOSE: 114 mg/dL — AB (ref 65–99)
Potassium: 4.2 mmol/L (ref 3.5–5.1)
Sodium: 138 mmol/L (ref 135–145)
Total Bilirubin: 0.3 mg/dL (ref 0.3–1.2)
Total Protein: 7.4 g/dL (ref 6.5–8.1)

## 2015-09-24 LAB — CBC
HCT: 28.6 % — ABNORMAL LOW (ref 40.0–52.0)
Hemoglobin: 8.8 g/dL — ABNORMAL LOW (ref 13.0–18.0)
MCH: 22.7 pg — AB (ref 26.0–34.0)
MCHC: 30.8 g/dL — AB (ref 32.0–36.0)
MCV: 73.8 fL — ABNORMAL LOW (ref 80.0–100.0)
PLATELETS: 419 10*3/uL (ref 150–440)
RBC: 3.88 MIL/uL — ABNORMAL LOW (ref 4.40–5.90)
RDW: 17.1 % — AB (ref 11.5–14.5)
WBC: 17.2 10*3/uL — ABNORMAL HIGH (ref 3.8–10.6)

## 2015-09-24 LAB — SURGICAL PATHOLOGY

## 2015-09-24 MED ORDER — OXYCODONE-ACETAMINOPHEN 5-325 MG PO TABS: 2.0000 | ORAL_TABLET | ORAL | Status: DC | PRN

## 2015-09-24 MED ORDER — ASPIRIN 81 MG PO TBEC: 81.0000 mg | DELAYED_RELEASE_TABLET | Freq: Every day | ORAL | Status: DC

## 2015-09-24 MED ORDER — METOPROLOL TARTRATE 25 MG PO TABS: 12.5000 mg | ORAL_TABLET | Freq: Two times a day (BID) | ORAL | Status: DC

## 2015-09-24 NOTE — Progress Notes (Signed)
[facility] at [place] NAME: Timothy Juarez    MR#:  [id number]  DATE OF BIRTH:  August 01, 1951  SUBJECTIVE:  No complaints of significant pain or discomfort, feels okay Work physical therapy yesterday skilled nursing facility placement was recommended,  Patient feels good today. Denies any pain. Left BKA stump was redressed yesterday by Dr. Trula Slade who felt that incision looked good.   REVIEW OF SYSTEMS:  CONSTITUTIONAL: No fever, fatigue or weakness.  EYES: No blurred or double vision.  EARS, NOSE, AND THROAT: No tinnitus or ear pain.  RESPIRATORY: No cough, shortness of breath, wheezing or hemoptysis.  CARDIOVASCULAR: No chest pain, orthopnea, edema.  GASTROINTESTINAL: No nausea, vomiting, diarrhea or abdominal pain.  GENITOURINARY: No dysuria, hematuria.  ENDOCRINE: No polyuria, nocturia,  HEMATOLOGY: No anemia, easy bruising or bleeding SKIN: No rash or lesion. MUSCULOSKELETAL: No joint pain or arthritis.   NEUROLOGIC: No tingling, numbness, weakness.  PSYCHIATRY: No anxiety or depression.   DRUG ALLERGIES:  No Known Allergies  VITALS:  Blood pressure 116/60, pulse 81, temperature 98.3 F (36.8 C), temperature source Oral, resp. rate 20, height 6\' 1"  (1.854 m), weight 88.043 kg (194 lb 1.6 oz), SpO2 92 %.  PHYSICAL EXAMINATION:  VITAL SIGNS: Filed Vitals:   09/24/15 0518 09/24/15 1022  BP: 113/57 116/60  Pulse: 76 81  Temp: 98.3 F (36.8 C)   Resp: 20    GENERAL:64 y.o.male currently in no acute distress.  HEAD: Normocephalic, atraumatic.  EYES: Pupils equal, round, reactive to light. Extraocular muscles intact. No scleral icterus.  MOUTH: Moist mucosal membrane. Dentition intact. No abscess noted.  EAR, NOSE, THROAT: Clear without exudates. No external lesions.  NECK: Supple. No thyromegaly. No nodules. No JVD.  PULMONARY: Clear to ascultation, without wheeze rails or rhonci. No use of accessory muscles, Good  respiratory effort. good air entry bilaterally CHEST: Nontender to palpation.  CARDIOVASCULAR: S1 and S2. Regular rate and rhythm. No murmurs, rubs, or gallops. No edema. Pedal pulses 2+ bilaterally.  GASTROINTESTINAL: Soft, nontender, nondistended. No masses. Positive bowel sounds. No hepatosplenomegaly.  MUSCULOSKELETAL: Status post left BKA, dressing was changed and no bleeding or drainage was noted. No significant swelling around the stump NEUROLOGIC: No gross focal neurological deficits.  SKIN: No ulceration, lesions, rashes, or cyanosis. Skin warm and dry. Turgor intact.  PSYCHIATRIC: Mood, affect within normal limits. The patient is awake, alert and oriented x 3. Insight, judgment intact.      LABORATORY PANEL:   CBC  Recent Labs Lab 09/24/15 0510  WBC 17.2*  HGB 8.8*  HCT 28.6*  PLT 419   ------------------------------------------------------------------------------------------------------------------  Chemistries   Recent Labs Lab 09/19/15 0413  09/24/15 0510  NA 138  < > 138  K 3.7  < > 4.2  CL 106  < > 105  CO2 23  < > 26  GLUCOSE 94  < > 114*  BUN 20  < > 19  CREATININE 1.15  < > 0.88  CALCIUM 9.0  < > 8.8*  MG 2.2  --   --   AST  --   --  75*  ALT  --   --  39  ALKPHOS  --   --  117  BILITOT  --   --  0.3  < > = values in this interval not displayed. ------------------------------------------------------------------------------------------------------------------  Cardiac Enzymes No results for input(s): TROPONINI in the last 168 hours. ------------------------------------------------------------------------------------------------------------------  RADIOLOGY:  No results found.  EKG:   Orders  placed or performed in visit on 03/21/13  . EKG 12-Lead    ASSESSMENT AND PLAN:   65 year old African American gentleman history of essential hypertension, peripheral arterial disease, COPD non-option requiring admitted 09/14/2015 for nonhealing  ulcers now postop day 1 left below-knee amputation   Assessment/Plan: 1. Essential hypertension- Currently blood pressure is well controlled. Continue metoprolol Lasix  2. Chronic diastolic congestive heart  failure , ef 60% Currently not fluid overloaded. Continue Lasix as well as metoprolol  3. Chronic history of COPD with past medical history of smoking No COPD exacerbation at this time. Continue Dulera and Spiriva  4. Nonhealing left lower extremity ulcers with peripheral artery disease, gangrene , status post BKA  Management by vascular and podiatry,  Postop day 4 left below-knee amputation, Oxycodone orally as needed for severe painPhysical therapist recommended skilled nursing facility placement, likely next week .   5. Leukocytosis, etiology is unclear. Patient is afebrile, he is off antibiotic as he is status post BKA, WBC count is stable at present , following periodically . Obtain blood cultures if patient becomes febrile Disposition suspect discharge soon to skilled nursing facility, discussed with care management, social worker consult order was placed     All the records are reviewed and case discussed with Care Management/Social Workerr. Management plans discussed with the patient, family and they are in agreement.  CODE STATUS: full  TOTAL TIME TAKING CARE OF THIS PATIENT:30 minutes .   POSSIBLE D/C IN 1-2 DAYS, DEPENDING ON CLINICAL CONDITION.   [name] M.D on 09/24/2015 at 12:18 PM  Between 7am to 6pm - Pager - [phone]  After 6pm: House Pager: - [phone]  [facility]  Office  [phone]  CC: Primary care physician; Marisa Hua, MD

## 2015-09-24 NOTE — Progress Notes (Signed)
Physical Therapy Treatment Patient Details Name: Florent Berhow MRN: [id number] DOB: December 14, 1950 Today's Date: 09/24/2015    History of Present Illness The past medical history of severe peripheral arterial disease was admitted to vascular surgery service for left lower extent the pain and nonhealing ulcers. Pt underwent L BKA secondary t o L foot gangrene. Pt is POD#1 at time of evaluation. AOx4 at time of evaluation. Pt reports that prior to November he was ambulating full facility distances with a quad cane at [facility] where is a LTC resident. Since November he has had nonhealing ulcers with severe [name] pain which has made him transfers only to wheelchair    PT Comments    Pt notes he was just up and out of bed to use the bathroom and leg pain is 7/10. Pt requesting pain medication. Pt agreeable to bed exercises. Nursing provides pain medication during session. Pt participates well with exercises. Progressing quad set with continued repetitions on the left. Resistance provided to most exercises on right for increased strengthening. Pt now has a discharge order back to [facility] today.   Follow Up Recommendations  SNF     Equipment Recommendations  Rolling walker with 5" wheels    Recommendations for Other Services       Precautions / Restrictions Precautions Precautions: Fall Restrictions Weight Bearing Restrictions:  ([name] NWB)    Mobility  Bed Mobility               General bed mobility comments: Pt notes he was just up to use the bathrrom and left leg is hurting; deferred out of bed  Transfers                 General transfer comment: Deferred; nursing notes during session that pt is now being discharged today.   Ambulation/Gait                 Stairs            Wheelchair Mobility    Modified Rankin (Stroke Patients Only)       Balance                                    Cognition Arousal/Alertness:  Awake/alert Behavior During Therapy: WFL for tasks assessed/performed Overall Cognitive Status: Within Functional Limits for tasks assessed                      Exercises General Exercises - Lower Extremity Ankle Circles/Pumps: AROM;Right;20 reps;Supine Quad Sets: Strengthening;Both;20 reps;Supine Gluteal Sets: Strengthening;Both;20 reps;Supine Short Arc Quad: AROM;Both;20 reps;Supine Heel Slides: AROM;Both;20 reps;Supine (resist on R) Hip ABduction/ADduction: AROM;Both;20 reps;Supine (resist on R) Straight Leg Raises: AROM;Both;20 reps;Supine    General Comments        Pertinent Vitals/Pain Pain Assessment: 0-10 Pain Score: 7  Pain Location: [name] Pain Intervention(s): Patient requesting pain meds-RN notified;RN gave pain meds during session;Limited activity within patient's tolerance    Home Living                      Prior Function            PT Goals (current goals can now be found in the care plan section) Progress towards PT goals: Progressing toward goals    Frequency  7X/week    PT Plan Current plan remains appropriate    Co-evaluation  End of Session   Activity Tolerance: Patient tolerated treatment well;No increased pain Patient left: in bed;with call bell/phone within reach;with bed alarm set     Time: [id number] PT Time Calculation (min) (ACUTE ONLY): 23 min  Charges:  $Therapeutic Exercise: 23-37 mins                    G Codes:      Charlaine Dalton 09/24/2015, 2:48 PM

## 2015-09-24 NOTE — Discharge Instructions (Signed)
Keep dry dressing on stump Resume normal activities as tolerated and can work with PT/OT RTC three weeks for staple removal and wound check

## 2015-09-24 NOTE — Progress Notes (Signed)
Initial Nutrition Assessment     INTERVENTION:  Meals and snacks: Cater to pt preferences Medical Nutrition Supplement Therapy:  Continue ensure for added nutrition   NUTRITION DIAGNOSIS:   Increased nutrient needs related to acute illness, wound healing as evidenced by estimated needs.    GOAL:   Patient will meet greater than or equal to 90% of their needs    MONITOR:    (Energy Intake, Anthropometrics, Electrolyte/Renal Profile, Digestive system)  REASON FOR ASSESSMENT:    (pressure ulcer)    ASSESSMENT:     Pt post op left BKA   Current Nutrition: eating 43% of meals and drinking 2 ensure per day per pt   Gastrointestinal Profile: Last BM: 2/5   Scheduled Medications:  . aspirin EC  81 mg Oral Daily  . baclofen  5 mg Oral QHS  . docusate sodium  100 mg Oral BID  . feeding supplement (ENSURE ENLIVE)  237 mL Oral BID BM  . furosemide  40 mg Oral Daily  . gabapentin  100 mg Oral QHS  . metoprolol tartrate  12.5 mg Oral BID  . mometasone-formoterol  2 puff Inhalation BID  . pantoprazole  40 mg Oral Daily  . polyethylene glycol  17 g Oral Daily  . potassium chloride SA  20 mEq Oral Daily  . senna  2 tablet Oral Daily  . tiotropium  18 mcg Inhalation Daily    Electrolyte/Renal Profile and Glucose Profile:   Recent Labs Lab 09/19/15 0413 09/20/15 0459 09/24/15 0510  NA 138 140 138  K 3.7 3.8 4.2  CL 106 109 105  CO2 23 24 26   BUN 20 17 19   CREATININE 1.15 1.10 0.88  CALCIUM 9.0 8.7* 8.8*  MG 2.2  --   --   GLUCOSE 94 98 114*   Protein Profile:   Recent Labs Lab 09/24/15 0510  ALBUMIN 2.8*      Weight Trend since Admission: Filed Weights   09/22/15 0534 09/23/15 0500 09/24/15 0237  Weight: 192 lb 1.6 oz (87.136 kg) 191 lb 12.8 oz (87 kg) 194 lb 1.6 oz (88.043 kg)       Diet Order:  Diet regular Room service appropriate?: Yes; Fluid consistency:: Thin  Skin:   (stage II pressure ulcer on foot documented)   Height:   Ht  Readings from Last 1 Encounters:  09/19/15 6\' 1"  (1.854 m)    Weight:   Wt Readings from Last 1 Encounters:  09/24/15 194 lb 1.6 oz (88.043 kg)    Ideal Body Weight:     BMI:  Body mass index is 25.61 kg/(m^2).  Estimated Nutritional Needs:   Kcal:  [id number] kcals (BEE 1724, 1.3 AF, 1.1-1.3 IF)   Protein:  109-127 g (1.2-1.4 g/kg)   Fluid:  2275-2730 mL (25-30 ml/kg)   EDUCATION NEEDS:   No education needs identified at this time  LOW Care Level  Jaymarie Yeakel B. Zenia Resides, [place], [place] (pager) Weekend/On-Call pager [phone])

## 2015-09-24 NOTE — Progress Notes (Signed)
Clinical Social Worker informed by Algernon Huxley, MD that patient is medically ready to discharge back to SNF. Patient is in a agreement with plan. Patient does not wish for CSW to call any family or friends to inform them that he will discharge back to [facility].     Call to [facility] to confirm that patient's bed is ready. Provided patient's room number 329-B and number to call for report [phone] . All discharge information faxed to  Facility. Rx's added to discharge packet.   RN will call report and patient will discharge to [facility] via [employer].  Casimer Lanius. [facility] Work Department [phone] 3:53 PM

## 2015-09-24 NOTE — Progress Notes (Signed)
Pt A and O x 4. VSS. Pt tolerating diet well. Complaints of pain with meds given to control. IV removed intact, prescriptions given. Pt had packet sent with him which contained prescription for pain meds. . Pt discharged via wheelchair with nurse aide and nurse tech. Pt picked up by [facility] transport. Report called to Tawni Levy at [facility].

## 2015-09-24 NOTE — Discharge Summary (Signed)
[facility]    Discharge Summary    Patient ID:  Timothy Juarez MRN: [id number] DOB/AGE: 08-31-1950 65 y.o.  Admit date: 09/14/2015 Discharge date: 09/24/2015 Date of Surgery: 09/14/2015 - 09/19/2015 Surgeon: Juliann Mule): Algernon Huxley, MD  Admission Diagnosis: AOS - with ulceration and necrotic toes gangrene left foot  Discharge Diagnoses:  AOS - with ulceration and necrotic toes gangrene left foot  Secondary Diagnoses: Past Medical History  Diagnosis Date  . Hypertension   . Peripheral vascular disease ([place])   . CHF (congestive heart failure) ([place])   . COPD (chronic obstructive pulmonary disease) ([place])   . Hypokalemia   . Insomnia   . Pressure ulcer     Procedure(s): AMPUTATION BELOW KNEE  Discharged Condition: good  HPI:  Patient admitted with non-reconstructable vascular disease and rest pain with gangrenous changes.  Little improvement with anticoagulation and antibiotics.  Proceeded with left BKA on 09/20/15  Hospital Course:  Timothy Juarez is a 65 y.o. male is S/P Left Procedure(s): AMPUTATION BELOW KNEE Extubated:in OR Physical exam: stump C/D/I Post-op wounds healing well Pt. Ambulating, voiding and taking PO diet without difficulty. Pt pain controlled with PO pain meds. Labs as below Complications:none Consults:  Treatment Team:  Albertine Patricia, DPM Nicholes Mango, MD  Significant Diagnostic Studies: CBC Lab Results  Component Value Date   WBC 17.2* 09/24/2015   HGB 8.8* 09/24/2015   HCT 28.6* 09/24/2015   MCV 73.8* 09/24/2015   PLT 419 09/24/2015    BMET    Component Value Date/Time   NA 138 09/24/2015 0510   NA 136 12/01/2013 1317   K 4.2 09/24/2015 0510   K 3.9 12/01/2013 1317   CL 105 09/24/2015 0510   CL 100 12/01/2013 1317   CO2 26 09/24/2015 0510   CO2 33* 12/01/2013 1317   GLUCOSE 114* 09/24/2015 0510   GLUCOSE 91 12/01/2013 1317   BUN 19 09/24/2015 0510   BUN 24* 09/25/2014 0846   CREATININE 0.88 09/24/2015 0510   CREATININE 1.29 09/25/2014 0846   CALCIUM 8.8* 09/24/2015 0510   CALCIUM 9.4 12/01/2013 1317   GFRNONAA >60 09/24/2015 0510   GFRNONAA 60* 09/25/2014 0846   GFRNONAA >60 03/16/2014 0750   GFRAA >60 09/24/2015 0510   GFRAA >60 09/25/2014 0846   GFRAA >60 03/16/2014 0750   COAG Lab Results  Component Value Date   INR 1.38 09/14/2015     Disposition:  Discharge to :[facility]    Medication List    STOP taking these medications        amoxicillin-clavulanate 875-125 MG tablet  Commonly known as:  AUGMENTIN     clopidogrel 75 MG tablet  Commonly known as:  PLAVIX     oxyCODONE-acetaminophen 10-325 MG tablet  Commonly known as:  PERCOCET  Replaced by:  oxyCODONE-acetaminophen 5-325 MG tablet      TAKE these medications        acetaminophen 325 MG tablet  Commonly known as:  TYLENOL  Take 650 mg by mouth at bedtime.     AMERICAINE 20 % oral spray  Generic drug:  benzocaine  Use as directed 1 application in the mouth or throat 4 (four) times daily as needed for throat irritation / pain. Reported on 09/14/2015     AMERICAINE 20 % rectal ointment  Generic drug:  benzocaine  Place rectally every 3 (three) hours as needed for pain. Reported on 09/14/2015     apixaban 2.5 MG Tabs tablet  Commonly known as:  ELIQUIS  Take 2.5 mg by mouth 2 (two) times daily.     aspirin 81 MG EC tablet  Take 1 tablet (81 mg total) by mouth daily.     baclofen 10 MG tablet  Commonly known as:  LIORESAL  Take 5 mg by mouth at bedtime. Reported on 09/14/2015     Fish Oil 1000 MG Caps  Take 1,000 mg by mouth every morning.     Fluticasone-Salmeterol 250-50 MCG/DOSE Aepb  Commonly known as:  ADVAIR  Inhale 1 puff into the lungs 2 (two) times daily.     furosemide 40 MG tablet  Commonly known as:  LASIX  Take 40 mg by mouth 2 (two) times daily.     gabapentin 100 MG capsule  Commonly known as:  NEURONTIN  Take 100 mg by mouth at bedtime.      guaiFENesin 100 MG/5ML liquid  Commonly known as:  ROBITUSSIN  Take 100 mg by mouth every 6 (six) hours as needed for cough.     metoprolol tartrate 25 MG tablet  Commonly known as:  LOPRESSOR  Take 0.5 tablets (12.5 mg total) by mouth 2 (two) times daily.     oxycodone 5 MG capsule  Commonly known as:  OXY-IR  Take 5 mg by mouth every 4 (four) hours as needed.     oxyCODONE-acetaminophen 5-325 MG tablet  Commonly known as:  PERCOCET/ROXICET  Take 2 tablets by mouth every 4 (four) hours as needed for moderate pain.     potassium chloride 10 MEQ tablet  Commonly known as:  K-DUR  Take 20 mEq by mouth daily.     sodium chloride 0.65 % Soln nasal spray  Commonly known as:  OCEAN  Place 1 spray into both nostrils as needed for congestion.     temazepam 15 MG capsule  Commonly known as:  RESTORIL  Take 15 mg by mouth at bedtime as needed for sleep.     tiotropium 18 MCG inhalation capsule  Commonly known as:  SPIRIVA  Place 18 mcg into inhaler and inhale daily.       Verbal and written Discharge instructions given to the patient. Wound care per Discharge AVS     Follow-up Information    Follow up with [facility] A STEGMAYER, PA-C In 3 weeks.   Specialty:  Physician Assistant   Contact information:   [place] [address] [phone]       Follow up with [facility] .   Specialty:  [place] information:   [address] [place] [phone]      Follow up with [employer] A STEGMAYER, PA-C In 3 weeks.   Specialty:  Physician Assistant   Why:  for wound check and staple removal   Contact information:   [address] [address] [id number]       Signed: [name], MD  09/24/2015, 2:40 PM

## 2015-10-11 ENCOUNTER — Other Ambulatory Visit: Payer: Self-pay | Admitting: Vascular Surgery

## 2015-10-11 ENCOUNTER — Encounter: Payer: Self-pay | Admitting: *Deleted

## 2015-10-11 NOTE — Patient Instructions (Signed)
  Your procedure is scheduled on: 10/18/15 Report to Day Surgery.[facility]OND FLOOR To find out your arrival time please call [phone] between 1PM - 3PM on3/1/17  Remember: Instructions that are not followed completely may result in serious medical risk, up to and including death, or upon the discretion of your surgeon and anesthesiologist your surgery may need to be rescheduled.    __X__ 1. Do not eat food or drink liquids after midnight. No gum chewing or hard candies.     __X__ 2. No Alcohol for 24 hours before or after surgery.   ____ 3. Bring all medications with you on the day of surgery if instructed.    X____ 4. Notify your doctor if there is any change in your medical condition     (cold, fever, infections).     Do not wear jewelry, make-up, hairpins, clips or nail polish.  Do not wear lotions, powders, or perfumes. You may wear deodorant.  Do not shave 48 hours prior to surgery. Men may shave face and neck.  Do not bring valuables to the hospital.    [facility] is not responsible for any belongings or valuables.               Contacts, dentures or bridgework may not be worn into surgery.  Leave your suitcase in the car. After surgery it may be brought to your room.  For patients admitted to the hospital, discharge time is determined by your                treatment team.   Patients discharged the day of surgery will not be allowed to drive home.   Please read over the following fact sheets that you were given:   Surgical Site Infection Prevention   __X__ Take these medicines the morning of surgery with A SIP OF WATER:    1. METOPROLOL  2.   3.   4.  5.  6.  ____ Fleet Enema (as directed)   ____ Use CHG Soap as directed  __X__ Use inhalers on the day of surgery  ____ Stop metformin 2 days prior to surgery    ____ Take 1/2 of usual insulin dose the night before surgery and none on the morning of surgery.   ____ Stop Coumadin/Plavix/aspirin  on  ____ Stop Anti-inflammatories on    ____ Stop supplements until after surgery.    ____ Bring C-Pap to the hospital.

## 2015-10-11 NOTE — Pre-Procedure Instructions (Signed)
INSTRUCTIONS FAXED TO [facility]

## 2015-10-18 ENCOUNTER — Ambulatory Visit
Admission: RE | Admit: 2015-10-18 | Discharge: 2015-10-18 | Disposition: A | Discharge status: Skilled Nursing Facility | Payer: Medicaid Other | Source: Ambulatory Visit | Attending: Vascular Surgery | Admitting: Vascular Surgery

## 2015-10-18 ENCOUNTER — Encounter: Payer: Self-pay | Admitting: *Deleted

## 2015-10-18 ENCOUNTER — Ambulatory Visit: Payer: Medicaid Other | Admitting: Anesthesiology

## 2015-10-18 ENCOUNTER — Encounter
Admission: RE | Disposition: A | Discharge status: Skilled Nursing Facility | Payer: Self-pay | Source: Ambulatory Visit | Attending: Vascular Surgery

## 2015-10-18 DIAGNOSIS — Z8249 Family history of ischemic heart disease and other diseases of the circulatory system: Secondary | ICD-10-CM | POA: Diagnosis not present

## 2015-10-18 DIAGNOSIS — I509 Heart failure, unspecified: Secondary | ICD-10-CM | POA: Diagnosis not present

## 2015-10-18 DIAGNOSIS — Z89512 Acquired absence of left leg below knee: Secondary | ICD-10-CM | POA: Insufficient documentation

## 2015-10-18 DIAGNOSIS — Z9861 Coronary angioplasty status: Secondary | ICD-10-CM | POA: Diagnosis not present

## 2015-10-18 DIAGNOSIS — I739 Peripheral vascular disease, unspecified: Secondary | ICD-10-CM | POA: Insufficient documentation

## 2015-10-18 DIAGNOSIS — T8781 Dehiscence of amputation stump: Secondary | ICD-10-CM | POA: Diagnosis present

## 2015-10-18 DIAGNOSIS — I10 Essential (primary) hypertension: Secondary | ICD-10-CM | POA: Diagnosis not present

## 2015-10-18 DIAGNOSIS — Z79899 Other long term (current) drug therapy: Secondary | ICD-10-CM | POA: Insufficient documentation

## 2015-10-18 DIAGNOSIS — Z87891 Personal history of nicotine dependence: Secondary | ICD-10-CM | POA: Diagnosis not present

## 2015-10-18 DIAGNOSIS — T8744 Infection of amputation stump, left lower extremity: Secondary | ICD-10-CM | POA: Insufficient documentation

## 2015-10-18 DIAGNOSIS — X58XXXA Exposure to other specified factors, initial encounter: Secondary | ICD-10-CM | POA: Insufficient documentation

## 2015-10-18 DIAGNOSIS — Z7901 Long term (current) use of anticoagulants: Secondary | ICD-10-CM | POA: Insufficient documentation

## 2015-10-18 DIAGNOSIS — J449 Chronic obstructive pulmonary disease, unspecified: Secondary | ICD-10-CM | POA: Diagnosis not present

## 2015-10-18 DIAGNOSIS — Z808 Family history of malignant neoplasm of other organs or systems: Secondary | ICD-10-CM | POA: Insufficient documentation

## 2015-10-18 HISTORY — PX: WOUND DEBRIDEMENT: SHX247

## 2015-10-18 HISTORY — PX: APPLICATION OF WOUND VAC: SHX5189

## 2015-10-18 LAB — BASIC METABOLIC PANEL
ANION GAP: 9 (ref 5–15)
BUN: 14 mg/dL (ref 6–20)
CALCIUM: 8.9 mg/dL (ref 8.9–10.3)
CHLORIDE: 103 mmol/L (ref 101–111)
CO2: 25 mmol/L (ref 22–32)
CREATININE: 1.02 mg/dL (ref 0.61–1.24)
GFR calc non Af Amer: >60 mL/min (ref >60)
Glucose, Bld: 102 mg/dL — ABNORMAL HIGH (ref 65–99)
Potassium: 3.6 mmol/L (ref 3.5–5.1)
SODIUM: 137 mmol/L (ref 135–145)

## 2015-10-18 LAB — CBC WITH DIFFERENTIAL/PLATELET
BASOS PCT: 1 %
Basophils Absolute: 0.1 10*3/uL (ref 0–0.1)
EOS ABS: 0.4 10*3/uL (ref 0–0.7)
EOS PCT: 3 %
HCT: 32 % — ABNORMAL LOW (ref 40.0–52.0)
Hemoglobin: 10.1 g/dL — ABNORMAL LOW (ref 13.0–18.0)
LYMPHS ABS: 2.1 10*3/uL (ref 1.0–3.6)
Lymphocytes Relative: 14 %
MCH: 22.4 pg — AB (ref 26.0–34.0)
MCHC: 31.8 g/dL — AB (ref 32.0–36.0)
MCV: 70.5 fL — ABNORMAL LOW (ref 80.0–100.0)
MONO ABS: 1.2 10*3/uL — AB (ref 0.2–1.0)
MONOS PCT: 8 %
Neutro Abs: 11 10*3/uL — ABNORMAL HIGH (ref 1.4–6.5)
Neutrophils Relative %: 74 %
PLATELETS: 339 10*3/uL (ref 150–440)
RBC: 4.53 MIL/uL (ref 4.40–5.90)
RDW: 18.1 % — AB (ref 11.5–14.5)
WBC: 14.7 10*3/uL — ABNORMAL HIGH (ref 3.8–10.6)

## 2015-10-18 LAB — PROTIME-INR
INR: 1.17
PROTHROMBIN TIME: 15.1 s — AB (ref 11.4–15.0)

## 2015-10-18 LAB — TYPE AND SCREEN
ABO/RH(D): A POS
Antibody Screen: NEGATIVE

## 2015-10-18 LAB — APTT: aPTT: 36 seconds (ref 24–36)

## 2015-10-18 SURGERY — DEBRIDEMENT, WOUND
Anesthesia: General | Laterality: Left

## 2015-10-18 MED ORDER — FAMOTIDINE 20 MG PO TABS
ORAL_TABLET | ORAL | Status: AC
  Administered 2015-10-18: 20 mg via ORAL
  Filled 2015-10-18: qty 1

## 2015-10-18 MED ORDER — CEFAZOLIN SODIUM-DEXTROSE 2-3 GM-% IV SOLR
INTRAVENOUS | Status: AC
  Filled 2015-10-18: qty 50

## 2015-10-18 MED ORDER — ONDANSETRON HCL 4 MG/2ML IJ SOLN: 4.0000 mg | Freq: Once | INTRAMUSCULAR | Status: DC | PRN

## 2015-10-18 MED ORDER — HYDROCODONE-ACETAMINOPHEN 5-325 MG PO TABS
1.0000 | ORAL_TABLET | Freq: Four times a day (QID) | ORAL | Status: DC | PRN
  Administered 2015-10-18: 1 via ORAL

## 2015-10-18 MED ORDER — CEFAZOLIN SODIUM-DEXTROSE 2-3 GM-% IV SOLR
2.0000 g | INTRAVENOUS | Status: AC
  Administered 2015-10-18: 2 g via INTRAVENOUS

## 2015-10-18 MED ORDER — PROPOFOL 10 MG/ML IV BOLUS
INTRAVENOUS | Status: DC | PRN
  Administered 2015-10-18: 150 mg via INTRAVENOUS

## 2015-10-18 MED ORDER — HYDROMORPHONE HCL 1 MG/ML IJ SOLN
0.5000 mg | INTRAMUSCULAR | Status: AC | PRN
  Administered 2015-10-18 (×4): 0.5 mg via INTRAVENOUS

## 2015-10-18 MED ORDER — HYDROCODONE-ACETAMINOPHEN 5-325 MG PO TABS: 1.0000 | ORAL_TABLET | Freq: Four times a day (QID) | ORAL | Status: DC | PRN

## 2015-10-18 MED ORDER — FENTANYL CITRATE (PF) 100 MCG/2ML IJ SOLN
INTRAMUSCULAR | Status: AC
  Administered 2015-10-18: 25 ug via INTRAVENOUS
  Filled 2015-10-18: qty 2

## 2015-10-18 MED ORDER — FAMOTIDINE 20 MG PO TABS
20.0000 mg | ORAL_TABLET | Freq: Once | ORAL | Status: AC
Start: 2015-10-18 — End: 2015-10-18
  Administered 2015-10-18: 20 mg via ORAL

## 2015-10-18 MED ORDER — PHENYLEPHRINE HCL 10 MG/ML IJ SOLN
INTRAMUSCULAR | Status: DC | PRN
  Administered 2015-10-18 (×3): 100 ug via INTRAVENOUS

## 2015-10-18 MED ORDER — FENTANYL CITRATE (PF) 100 MCG/2ML IJ SOLN
INTRAMUSCULAR | Status: DC | PRN
  Administered 2015-10-18: 25 ug via INTRAVENOUS
  Administered 2015-10-18: 50 ug via INTRAVENOUS
  Administered 2015-10-18: 25 ug via INTRAVENOUS

## 2015-10-18 MED ORDER — HYDROCODONE-ACETAMINOPHEN 5-325 MG PO TABS
ORAL_TABLET | ORAL | Status: AC
  Filled 2015-10-18: qty 1

## 2015-10-18 MED ORDER — FENTANYL CITRATE (PF) 100 MCG/2ML IJ SOLN
25.0000 ug | INTRAMUSCULAR | Status: AC | PRN
  Administered 2015-10-18 (×6): 25 ug via INTRAVENOUS

## 2015-10-18 MED ORDER — ONDANSETRON HCL 4 MG/2ML IJ SOLN
INTRAMUSCULAR | Status: DC | PRN
  Administered 2015-10-18: 4 mg via INTRAVENOUS

## 2015-10-18 MED ORDER — HYDROMORPHONE HCL 1 MG/ML IJ SOLN
INTRAMUSCULAR | Status: AC
  Administered 2015-10-18: 0.5 mg via INTRAVENOUS
  Filled 2015-10-18: qty 1

## 2015-10-18 MED ORDER — LACTATED RINGERS IV SOLN
INTRAVENOUS | Status: DC
  Administered 2015-10-18: 13:00:00 via INTRAVENOUS

## 2015-10-18 MED ORDER — EPHEDRINE SULFATE 50 MG/ML IJ SOLN
INTRAMUSCULAR | Status: DC | PRN
  Administered 2015-10-18 (×2): 5 mg via INTRAVENOUS

## 2015-10-18 MED ORDER — SODIUM CHLORIDE 0.9 % IR SOLN
Status: DC | PRN
  Administered 2015-10-18: 1000 mL

## 2015-10-18 MED ORDER — MIDAZOLAM HCL 2 MG/2ML IJ SOLN
INTRAMUSCULAR | Status: DC | PRN
  Administered 2015-10-18 (×2): 1 mg via INTRAVENOUS

## 2015-10-18 SURGICAL SUPPLY — 38 items
BRUSH SCRUB 4% CHG (MISCELLANEOUS) ×1 IMPLANT
CANISTER SUCT 1200ML W/VALVE (MISCELLANEOUS) ×3 IMPLANT
CHLORAPREP W/TINT 26ML (MISCELLANEOUS) ×1 IMPLANT
DRAPE INCISE IOBAN 66X45 STRL (DRAPES) ×5 IMPLANT
DRAPE LAPAROTOMY T 102X78X121 (DRAPES) ×3 IMPLANT
DRSG VAC ATS MED SENSATRAC (GAUZE/BANDAGES/DRESSINGS) ×3 IMPLANT
ELECT CAUTERY BLADE 6.4 (BLADE) ×3 IMPLANT
ELECT REM PT RETURN 9FT ADLT (ELECTROSURGICAL) ×3
ELECTRODE REM PT RTRN 9FT ADLT (ELECTROSURGICAL) ×1 IMPLANT
GLOVE BIO SURGEON STRL SZ7 (GLOVE) ×11 IMPLANT
GOWN STRL REUS W/ TWL LRG LVL3 (GOWN DISPOSABLE) ×1 IMPLANT
GOWN STRL REUS W/ TWL XL LVL3 (GOWN DISPOSABLE) ×1 IMPLANT
GOWN STRL REUS W/TWL LRG LVL3 (GOWN DISPOSABLE) ×9
GOWN STRL REUS W/TWL XL LVL3 (GOWN DISPOSABLE) ×3
HANDPIECE SUCTION TUBG SURGILV (MISCELLANEOUS) ×3 IMPLANT
IV NS 1000ML (IV SOLUTION) ×3
IV NS 1000ML BAXH (IV SOLUTION) ×1 IMPLANT
KIT RM TURNOVER STRD PROC AR (KITS) ×3 IMPLANT
LABEL OR SOLS (LABEL) ×3 IMPLANT
NS IRRIG 1000ML POUR BTL (IV SOLUTION) ×3 IMPLANT
NS IRRIG 500ML POUR BTL (IV SOLUTION) ×3 IMPLANT
PACK BASIN MINOR ARMC (MISCELLANEOUS) ×1 IMPLANT
PACK EXTREMITY ARMC (MISCELLANEOUS) ×3 IMPLANT
PAD PREP 24X41 OB/GYN DISP (PERSONAL CARE ITEMS) ×3 IMPLANT
REMOVER STAPLE SKIN (DISPOSABLE) ×2 IMPLANT
SOL PREP PVP 2OZ (MISCELLANEOUS)
SOLUTION PREP PVP 2OZ (MISCELLANEOUS) ×1 IMPLANT
SPONGE LAP 18X18 5 PK (GAUZE/BANDAGES/DRESSINGS) ×3 IMPLANT
SUT ETHILON 4-0 (SUTURE)
SUT ETHILON 4-0 FS2 18XMFL BLK (SUTURE)
SUT VIC AB 3-0 SH 27 (SUTURE)
SUT VIC AB 3-0 SH 27X BRD (SUTURE) ×2 IMPLANT
SUTURE ETHLN 4-0 FS2 18XMF BLK (SUTURE) ×1 IMPLANT
SWAB CULTURE AMIES ANAERIB BLU (MISCELLANEOUS) ×1 IMPLANT
SYR BULB EAR ULCER 3OZ GRN STR (SYRINGE) ×3 IMPLANT
SYS IRRIGATION SURGILAV PLUS (MISCELLANEOUS) ×2 IMPLANT
TRAY PREP VAG/GEN (MISCELLANEOUS) ×2 IMPLANT
WND VAC CANISTER 500ML (MISCELLANEOUS) ×1 IMPLANT

## 2015-10-18 NOTE — Anesthesia Procedure Notes (Signed)
Procedure Name: LMA Insertion Performed by: Sherlonda Flater Pre-anesthesia Checklist: Patient identified, Patient being monitored, Timeout performed, Emergency Drugs available and Suction available Patient Re-evaluated:Patient Re-evaluated prior to inductionOxygen Delivery Method: Circle system utilized Preoxygenation: Pre-oxygenation with 100% oxygen Intubation Type: IV induction Ventilation: Mask ventilation without difficulty LMA: LMA inserted LMA Size: 4.0 Tube type: Oral Number of attempts: 1 Placement Confirmation: positive ETCO2 and breath sounds checked- equal and bilateral Tube secured with: Tape Dental Injury: Teeth and Oropharynx as per pre-operative assessment        

## 2015-10-18 NOTE — Discharge Instructions (Signed)

## 2015-10-18 NOTE — Op Note (Signed)
    OPERATIVE NOTE   PROCEDURE: 1. Irrigation and debridement of left below-knee amputation stump for skin, soft tissue, and muscle for approximately 75 cm with negative pressure dressing placement   PRE-OPERATIVE DIAGNOSIS: Nonviable tissue and infection of the left below-knee amputation stump with muscle necrosis  POST-OPERATIVE DIAGNOSIS: Same as above  SURGEON: [name], MD  ASSISTANT(S): Hezzie Bump, PA-C  ANESTHESIA: Gen  ESTIMATED BLOOD LOSS: Minimal  FINDING(S): None  SPECIMEN(S):  None  INDICATIONS:   Timothy Juarez is a 65 y.o. male who presents with dehiscence of his left below-knee amputation stump with nonviable tissue and likely infection. He has necrosis of some of the visible muscle particularly laterally. He is brought to the operating room for surgical debridement and negative pressure dressing placement. Risks and benefits were discussed and informed consent was obtained.  DESCRIPTION: After obtaining full informed written consent, the patient was brought back to the operating room and placed supine upon the operating table.  The patient received IV antibiotics prior to induction.  After obtaining adequate anesthesia, the patient was prepped and draped in the standard fashion for.  The wound was then opened and excisional debridement was performed to the left below-knee amputation stump to remove all clearly non-viable tissue.  The tissue was taken back to bleeding tissue that appeared potentially although there was significant muscle that was marginal remaining viable.  The debridement was performed with a combination of electrocautery, scalpel, and Mayo scissors and encompassed an area of approximately 75 cm2. The wound was about 16-18 cm in length and about 5-6 cm wide with several centimeters of depth present as well and was somewhat elliptical in its shape. The wound was irrigated copiously with copious amounts of pulse lavage saline.  After all clearly  non-viable tissue was removed, I placed three 2-0 nylon sutures in a mattress fashion in the skin overlying the tibia to get better coverage . I then cut a negative pressure dressing in a fashion to fit the wound tucking it into the wound itself laterally and covering the nearly closed skin incision medially. Strips of out and were used to create an occlusive seal which was done without difficulty. This was connected to suction with a good seal obtained. The patient was then awakened from anesthesia and taken to the recovery room in stable condition having tolerated the procedure well.  COMPLICATIONS: none  CONDITION: stable  Timothy Juarez  10/18/2015, 2:43 PM

## 2015-10-18 NOTE — Anesthesia Preprocedure Evaluation (Signed)
Anesthesia Evaluation  Patient identified by MRN, date of birth, ID band Patient awake    Reviewed: Allergy & Precautions, H&P , NPO status , Patient's Chart, lab work & pertinent test results, reviewed documented beta blocker date and time   Airway Mallampati: II  TM Distance: >3 FB Neck ROM: full    Dental  (+) Teeth Intact, Poor Dentition   Pulmonary neg pulmonary ROS, COPD, former smoker,    Pulmonary exam normal        Cardiovascular Exercise Tolerance: Good hypertension, + Peripheral Vascular Disease and +CHF  negative cardio ROS Normal cardiovascular exam Rate:Normal     Neuro/Psych negative neurological ROS  negative psych ROS   GI/Hepatic negative GI ROS, Neg liver ROS,   Endo/Other  negative endocrine ROS  Renal/GU negative Renal ROS  negative genitourinary   Musculoskeletal   Abdominal   Peds  Hematology negative hematology ROS (+)   Anesthesia Other Findings   Reproductive/Obstetrics negative OB ROS                             Anesthesia Physical Anesthesia Plan  ASA: III  Anesthesia Plan: General LMA   Post-op Pain Management:    Induction:   Airway Management Planned:   Additional Equipment:   Intra-op Plan:   Post-operative Plan:   Informed Consent: I have reviewed the patients History and Physical, chart, labs and discussed the procedure including the risks, benefits and alternatives for the proposed anesthesia with the patient or authorized representative who has indicated his/her understanding and acceptance.   Dental Advisory Given  Plan Discussed with: CRNA  Anesthesia Plan Comments:         Anesthesia Quick Evaluation

## 2015-10-18 NOTE — Anesthesia Postprocedure Evaluation (Signed)
Anesthesia Post Note  Patient: Tonia Clowe  Procedure(s) Performed: Procedure(s) (LRB): DEBRIDEMENT WOUND   ( LEFT BKA DEBRIDEMENT ) (Left) APPLICATION OF WOUND VAC (Left)  Patient location during evaluation: PACU Anesthesia Type: General Level of consciousness: awake and alert Pain management: pain level controlled Vital Signs Assessment: post-procedure vital signs reviewed and stable Respiratory status: spontaneous breathing, nonlabored ventilation, respiratory function stable and patient connected to nasal cannula oxygen Cardiovascular status: blood pressure returned to baseline and stable Postop Assessment: no signs of nausea or vomiting Anesthetic complications: no    Last Vitals:  Filed Vitals:   10/18/15 1230 10/18/15 1445  BP: 129/55 128/67  Pulse: 71 58  Temp: 36.6 C 36.6 C  Resp:  14    Last Pain: There were no vitals filed for this visit.               Molli Barrows

## 2015-10-18 NOTE — H&P (Signed)
  [facility] History & Physical Update  The patient was interviewed and re-examined.  The patient's previous History and Physical has been reviewed and is unchanged.  There is no change in the plan of care. We plan to proceed with the scheduled procedure.  Vivan Agostino, MD  10/18/2015, 12:10 PM

## 2015-10-18 NOTE — Transfer of Care (Signed)
Immediate Anesthesia Transfer of Care Note  Patient: Timothy Juarez  Procedure(s) Performed: Procedure(s): DEBRIDEMENT WOUND   ( LEFT BKA DEBRIDEMENT ) (Left) APPLICATION OF WOUND VAC (Left)  Patient Location: PACU  Anesthesia Type:General  Level of Consciousness: sedated and responds to stimulation  Airway & Oxygen Therapy: Patient Spontanous Breathing and Patient connected to face mask oxygen  Post-op Assessment: Report given to RN and Post -op Vital signs reviewed and stable  Post vital signs: Reviewed and stable  Last Vitals:  Filed Vitals:   10/18/15 1230 10/18/15 1445  BP: 129/55 128/67  Pulse: 71 58  Temp: 36.6 C 36.6 C  Resp:  14    Complications: No apparent anesthesia complications

## 2015-10-22 ENCOUNTER — Encounter: Payer: Self-pay | Admitting: Vascular Surgery

## 2015-10-25 ENCOUNTER — Other Ambulatory Visit
Admission: RE | Admit: 2015-10-25 | Discharge: 2015-10-25 | Disposition: A | Discharge status: Home / Self Care | Payer: Medicaid Other | Source: Ambulatory Visit | Attending: Physician Assistant | Admitting: Physician Assistant

## 2015-10-25 DIAGNOSIS — R509 Fever, unspecified: Secondary | ICD-10-CM | POA: Insufficient documentation

## 2015-10-25 LAB — CBC WITH DIFFERENTIAL/PLATELET
BASOS ABS: 0.1 10*3/uL (ref 0–0.1)
Basophils Relative: 0 %
EOS PCT: 5 %
Eosinophils Absolute: 0.6 10*3/uL (ref 0–0.7)
HCT: 30.5 % — ABNORMAL LOW (ref 40.0–52.0)
Hemoglobin: 9.5 g/dL — ABNORMAL LOW (ref 13.0–18.0)
LYMPHS PCT: 17 %
Lymphs Abs: 2.2 10*3/uL (ref 1.0–3.6)
MCH: 21.9 pg — ABNORMAL LOW (ref 26.0–34.0)
MCHC: 31.2 g/dL — ABNORMAL LOW (ref 32.0–36.0)
MCV: 70.2 fL — ABNORMAL LOW (ref 80.0–100.0)
Monocytes Absolute: 1 10*3/uL (ref 0.2–1.0)
Monocytes Relative: 8 %
NEUTROS PCT: 70 %
Neutro Abs: 9 10*3/uL — ABNORMAL HIGH (ref 1.4–6.5)
PLATELETS: 389 10*3/uL (ref 150–440)
RBC: 4.35 MIL/uL — AB (ref 4.40–5.90)
RDW: 18.3 % — ABNORMAL HIGH (ref 11.5–14.5)
WBC: 12.8 10*3/uL — AB (ref 3.8–10.6)

## 2015-10-25 LAB — BASIC METABOLIC PANEL
ANION GAP: 7 (ref 5–15)
BUN: 15 mg/dL (ref 6–20)
CALCIUM: 8.8 mg/dL — AB (ref 8.9–10.3)
CO2: 31 mmol/L (ref 22–32)
Chloride: 102 mmol/L (ref 101–111)
Creatinine, Ser: 1.31 mg/dL — ABNORMAL HIGH (ref 0.61–1.24)
GFR, EST NON AFRICAN AMERICAN: 56 mL/min — AB (ref >60)
GLUCOSE: 98 mg/dL (ref 65–99)
Potassium: 3.9 mmol/L (ref 3.5–5.1)
SODIUM: 140 mmol/L (ref 135–145)

## 2015-10-25 LAB — SEDIMENTATION RATE: SED RATE: 76 mm/h — AB (ref 0–20)

## 2015-10-28 LAB — WOUND CULTURE

## 2015-10-29 ENCOUNTER — Other Ambulatory Visit: Payer: Self-pay | Admitting: Vascular Surgery

## 2015-10-29 LAB — ANAEROBIC CULTURE

## 2015-10-30 ENCOUNTER — Encounter
Admission: RE | Admit: 2015-10-30 | Discharge: 2015-10-30 | Disposition: A | Discharge status: Home / Self Care | Payer: Medicaid Other | Source: Ambulatory Visit | Attending: Vascular Surgery | Admitting: Vascular Surgery

## 2015-10-30 DIAGNOSIS — Z01812 Encounter for preprocedural laboratory examination: Secondary | ICD-10-CM | POA: Diagnosis present

## 2015-10-30 HISTORY — DX: Contracture of muscle, unspecified site: M62.40

## 2015-10-30 HISTORY — DX: Acute respiratory failure, unspecified whether with hypoxia or hypercapnia: J96.00

## 2015-10-30 LAB — CBC WITH DIFFERENTIAL/PLATELET
BASOS ABS: 0.1 10*3/uL (ref 0–0.1)
Basophils Relative: 1 %
EOS PCT: 3 %
Eosinophils Absolute: 0.3 10*3/uL (ref 0–0.7)
HEMATOCRIT: 33.2 % — AB (ref 40.0–52.0)
Hemoglobin: 10.1 g/dL — ABNORMAL LOW (ref 13.0–18.0)
Lymphocytes Relative: 17 %
Lymphs Abs: 2 10*3/uL (ref 1.0–3.6)
MCH: 21.3 pg — AB (ref 26.0–34.0)
MCHC: 30.3 g/dL — AB (ref 32.0–36.0)
MCV: 70.1 fL — ABNORMAL LOW (ref 80.0–100.0)
MONO ABS: 0.8 10*3/uL (ref 0.2–1.0)
MONOS PCT: 7 %
Neutro Abs: 8.9 10*3/uL — ABNORMAL HIGH (ref 1.4–6.5)
Neutrophils Relative %: 74 %
PLATELETS: 448 10*3/uL — AB (ref 150–440)
RBC: 4.73 MIL/uL (ref 4.40–5.90)
RDW: 18.2 % — AB (ref 11.5–14.5)
WBC: 12.2 10*3/uL — ABNORMAL HIGH (ref 3.8–10.6)

## 2015-10-30 LAB — BASIC METABOLIC PANEL
ANION GAP: 3 — AB (ref 5–15)
BUN: 17 mg/dL (ref 6–20)
CALCIUM: 9.2 mg/dL (ref 8.9–10.3)
CO2: 28 mmol/L (ref 22–32)
CREATININE: 0.95 mg/dL (ref 0.61–1.24)
Chloride: 106 mmol/L (ref 101–111)
GFR calc Af Amer: >60 mL/min (ref >60)
GLUCOSE: 110 mg/dL — AB (ref 65–99)
Potassium: 3.9 mmol/L (ref 3.5–5.1)
Sodium: 137 mmol/L (ref 135–145)

## 2015-10-30 LAB — PROTIME-INR
INR: 1.2
Prothrombin Time: 15.4 seconds — ABNORMAL HIGH (ref 11.4–15.0)

## 2015-10-30 LAB — APTT: APTT: 37 s — AB (ref 24–36)

## 2015-10-30 NOTE — Patient Instructions (Signed)
  Your procedure is scheduled on:11/01/15 Report to Day Surgery. [facility]OND FLOOR To find out your arrival time please call [phone] between 1PM - 3PM on3/15/17  Remember: Instructions that are not followed completely may result in serious medical risk, up to and including death, or upon the discretion of your surgeon and anesthesiologist your surgery may need to be rescheduled.    _X___ 1. Do not eat food or drink liquids after midnight. No gum chewing or hard candies.     __X__ 2. No Alcohol for 24 hours before or after surgery.   ____ 3. Bring all medications with you on the day of surgery if instructed.    __X__ 4. Notify your doctor if there is any change in your medical condition     (cold, fever, infections).     Do not wear jewelry, make-up, hairpins, clips or nail polish.  Do not wear lotions, powders, or perfumes. You may wear deodorant.  Do not shave 48 hours prior to surgery. Men may shave face and neck.  Do not bring valuables to the hospital.    [facility] is not responsible for any belongings or valuables.               Contacts, dentures or bridgework may not be worn into surgery.  Leave your suitcase in the car. After surgery it may be brought to your room.  For patients admitted to the hospital, discharge time is determined by your                treatment team.   Patients discharged the day of surgery will not be allowed to drive home.   Please read over the following fact sheets that you were given:   Surgical Site Infection Prevention   ____ Take these medicines the morning of surgery with A SIP OF WATER:    1. BACLOFEN  2. METOPROLOL  3.   4.  5.  6.  ____ Fleet Enema (as directed)   ____ Use CHG Soap as directed  __X__ Use inhalers on the day of surgery  ____ Stop metformin 2 days prior to surgery    ____ Take 1/2 of usual insulin dose the night before surgery and none on the morning of surgery.   _X___ Stop  Coumadin/Plavix/aspirin on   CALL DR DEW  [phone] IF YOU DO NOT HEAR FROM THEIR OFFICE BY THIS AFTERNOON ABOUT IF AND WHEN TO STOP ELIQUIS  ____ Stop Anti-inflammatories on   ____ Stop supplements until after surgery.    ____ Bring C-Pap to the hospital.

## 2015-11-01 ENCOUNTER — Inpatient Hospital Stay: Payer: Medicaid Other | Admitting: Anesthesiology

## 2015-11-01 ENCOUNTER — Inpatient Hospital Stay
Admission: RE | Admit: 2015-11-01 | Discharge: 2015-11-12 | DRG: 241 | Disposition: A | Discharge status: Skilled Nursing Facility | Payer: Medicaid Other | Source: Ambulatory Visit | Attending: Vascular Surgery | Admitting: Vascular Surgery

## 2015-11-01 ENCOUNTER — Encounter: Payer: Self-pay | Admitting: *Deleted

## 2015-11-01 ENCOUNTER — Encounter
Admission: RE | Disposition: A | Discharge status: Skilled Nursing Facility | Payer: Self-pay | Source: Ambulatory Visit | Attending: Vascular Surgery

## 2015-11-01 DIAGNOSIS — D649 Anemia, unspecified: Secondary | ICD-10-CM | POA: Diagnosis present

## 2015-11-01 DIAGNOSIS — Z7901 Long term (current) use of anticoagulants: Secondary | ICD-10-CM | POA: Diagnosis not present

## 2015-11-01 DIAGNOSIS — R059 Cough, unspecified: Secondary | ICD-10-CM

## 2015-11-01 DIAGNOSIS — J449 Chronic obstructive pulmonary disease, unspecified: Secondary | ICD-10-CM | POA: Diagnosis present

## 2015-11-01 DIAGNOSIS — I96 Gangrene, not elsewhere classified: Principal | ICD-10-CM | POA: Diagnosis present

## 2015-11-01 DIAGNOSIS — I110 Hypertensive heart disease with heart failure: Secondary | ICD-10-CM | POA: Diagnosis present

## 2015-11-01 DIAGNOSIS — Z8249 Family history of ischemic heart disease and other diseases of the circulatory system: Secondary | ICD-10-CM

## 2015-11-01 DIAGNOSIS — Z7951 Long term (current) use of inhaled steroids: Secondary | ICD-10-CM | POA: Diagnosis not present

## 2015-11-01 DIAGNOSIS — Z9889 Other specified postprocedural states: Secondary | ICD-10-CM

## 2015-11-01 DIAGNOSIS — I509 Heart failure, unspecified: Secondary | ICD-10-CM | POA: Diagnosis present

## 2015-11-01 DIAGNOSIS — I70262 Atherosclerosis of native arteries of extremities with gangrene, left leg: Secondary | ICD-10-CM | POA: Diagnosis present

## 2015-11-01 DIAGNOSIS — Z79899 Other long term (current) drug therapy: Secondary | ICD-10-CM

## 2015-11-01 DIAGNOSIS — R05 Cough: Secondary | ICD-10-CM

## 2015-11-01 DIAGNOSIS — Z808 Family history of malignant neoplasm of other organs or systems: Secondary | ICD-10-CM

## 2015-11-01 DIAGNOSIS — Z87891 Personal history of nicotine dependence: Secondary | ICD-10-CM

## 2015-11-01 DIAGNOSIS — R509 Fever, unspecified: Secondary | ICD-10-CM

## 2015-11-01 HISTORY — PX: AMPUTATION: SHX166

## 2015-11-01 LAB — SURGICAL PCR SCREEN
MRSA, PCR: NEGATIVE
Staphylococcus aureus: NEGATIVE

## 2015-11-01 SURGERY — AMPUTATION, ABOVE KNEE
Anesthesia: General | Laterality: Left | Wound class: Dirty or Infected

## 2015-11-01 MED ORDER — TEMAZEPAM 15 MG PO CAPS: 15.0000 mg | ORAL_CAPSULE | Freq: Every evening | ORAL | Status: DC | PRN

## 2015-11-01 MED ORDER — HYDRALAZINE HCL 20 MG/ML IJ SOLN: 5.0000 mg | INTRAMUSCULAR | Status: DC | PRN

## 2015-11-01 MED ORDER — OXYCODONE HCL 5 MG PO TABS
5.0000 mg | ORAL_TABLET | Freq: Once | ORAL | Status: AC | PRN
  Administered 2015-11-01: 5 mg via ORAL

## 2015-11-01 MED ORDER — GABAPENTIN 100 MG PO CAPS
100.0000 mg | ORAL_CAPSULE | Freq: Every day | ORAL | Status: DC
  Administered 2015-11-01 – 2015-11-11 (×11): 100 mg via ORAL
  Filled 2015-11-01 (×11): qty 1

## 2015-11-01 MED ORDER — FAMOTIDINE IN NACL 20-0.9 MG/50ML-% IV SOLN
20.0000 mg | Freq: Two times a day (BID) | INTRAVENOUS | Status: DC
  Administered 2015-11-01 – 2015-11-03 (×4): 20 mg via INTRAVENOUS
  Filled 2015-11-01 (×5): qty 50

## 2015-11-01 MED ORDER — BACLOFEN 10 MG PO TABS
5.0000 mg | ORAL_TABLET | Freq: Three times a day (TID) | ORAL | Status: DC
  Administered 2015-11-01 – 2015-11-12 (×32): 5 mg via ORAL
  Filled 2015-11-01 (×32): qty 1

## 2015-11-01 MED ORDER — HYDROMORPHONE HCL 1 MG/ML IJ SOLN
INTRAMUSCULAR | Status: AC
  Administered 2015-11-01: 0.5 mg via INTRAVENOUS
  Filled 2015-11-01: qty 1

## 2015-11-01 MED ORDER — APIXABAN 2.5 MG PO TABS
2.5000 mg | ORAL_TABLET | Freq: Two times a day (BID) | ORAL | Status: DC
  Administered 2015-11-01 – 2015-11-12 (×22): 2.5 mg via ORAL
  Filled 2015-11-01 (×22): qty 1

## 2015-11-01 MED ORDER — MOMETASONE FURO-FORMOTEROL FUM 200-5 MCG/ACT IN AERO
2.0000 | INHALATION_SPRAY | Freq: Two times a day (BID) | RESPIRATORY_TRACT | Status: DC
  Administered 2015-11-01 – 2015-11-12 (×22): 2 via RESPIRATORY_TRACT
  Filled 2015-11-01: qty 8.8

## 2015-11-01 MED ORDER — POTASSIUM CHLORIDE CRYS ER 20 MEQ PO TBCR: 20.0000 meq | EXTENDED_RELEASE_TABLET | Freq: Every day | ORAL | Status: DC | PRN

## 2015-11-01 MED ORDER — FAMOTIDINE 20 MG PO TABS
ORAL_TABLET | ORAL | Status: AC
  Administered 2015-11-01: 20 mg via ORAL
  Filled 2015-11-01: qty 1

## 2015-11-01 MED ORDER — LABETALOL HCL 5 MG/ML IV SOLN
10.0000 mg | INTRAVENOUS | Status: DC | PRN
  Filled 2015-11-01: qty 4

## 2015-11-01 MED ORDER — FENTANYL CITRATE (PF) 100 MCG/2ML IJ SOLN: 25.0000 ug | INTRAMUSCULAR | Status: DC | PRN

## 2015-11-01 MED ORDER — CEFAZOLIN SODIUM-DEXTROSE 2-3 GM-% IV SOLR
INTRAVENOUS | Status: AC
  Filled 2015-11-01: qty 50

## 2015-11-01 MED ORDER — ACETAMINOPHEN 325 MG PO TABS
650.0000 mg | ORAL_TABLET | Freq: Every day | ORAL | Status: DC
  Administered 2015-11-01 – 2015-11-11 (×9): 650 mg via ORAL
  Filled 2015-11-01 (×10): qty 2

## 2015-11-01 MED ORDER — ONDANSETRON HCL 4 MG/2ML IJ SOLN
INTRAMUSCULAR | Status: DC | PRN
  Administered 2015-11-01: 4 mg via INTRAVENOUS

## 2015-11-01 MED ORDER — ASPIRIN EC 81 MG PO TBEC
81.0000 mg | DELAYED_RELEASE_TABLET | Freq: Every day | ORAL | Status: DC
  Administered 2015-11-02 – 2015-11-12 (×11): 81 mg via ORAL
  Filled 2015-11-01 (×11): qty 1

## 2015-11-01 MED ORDER — DOXYCYCLINE HYCLATE 100 MG PO TABS
100.0000 mg | ORAL_TABLET | Freq: Two times a day (BID) | ORAL | Status: DC
  Administered 2015-11-01 – 2015-11-12 (×22): 100 mg via ORAL
  Filled 2015-11-01 (×22): qty 1

## 2015-11-01 MED ORDER — SODIUM CHLORIDE 0.9 % IV SOLN
INTRAVENOUS | Status: DC
  Administered 2015-11-01 – 2015-11-02 (×2): via INTRAVENOUS

## 2015-11-01 MED ORDER — METOPROLOL TARTRATE 25 MG PO TABS
12.5000 mg | ORAL_TABLET | Freq: Two times a day (BID) | ORAL | Status: DC
  Administered 2015-11-01 – 2015-11-11 (×20): 12.5 mg via ORAL
  Filled 2015-11-01 (×22): qty 1

## 2015-11-01 MED ORDER — BUPIVACAINE HCL (PF) 0.5 % IJ SOLN
INTRAMUSCULAR | Status: AC
  Filled 2015-11-01: qty 30

## 2015-11-01 MED ORDER — DEXTROSE 5 % IV SOLN
1.5000 g | Freq: Two times a day (BID) | INTRAVENOUS | Status: AC
  Administered 2015-11-01 – 2015-11-02 (×2): 1.5 g via INTRAVENOUS
  Filled 2015-11-01 (×2): qty 1.5

## 2015-11-01 MED ORDER — HYDROMORPHONE HCL 1 MG/ML IJ SOLN
0.5000 mg | INTRAMUSCULAR | Status: AC | PRN
  Administered 2015-11-01 (×4): 0.5 mg via INTRAVENOUS

## 2015-11-01 MED ORDER — FENTANYL CITRATE (PF) 100 MCG/2ML IJ SOLN
INTRAMUSCULAR | Status: DC | PRN
  Administered 2015-11-01 (×10): 50 ug via INTRAVENOUS

## 2015-11-01 MED ORDER — POLYETHYLENE GLYCOL 3350 17 G PO PACK
17.0000 g | PACK | Freq: Every day | ORAL | Status: DC | PRN
  Administered 2015-11-05: 17 g via ORAL
  Filled 2015-11-01: qty 1

## 2015-11-01 MED ORDER — LACTATED RINGERS IV SOLN
INTRAVENOUS | Status: DC
  Administered 2015-11-01: 15:00:00 via INTRAVENOUS

## 2015-11-01 MED ORDER — OXYCODONE HCL 5 MG PO TABS
ORAL_TABLET | ORAL | Status: AC
  Administered 2015-11-01: 5 mg via ORAL
  Filled 2015-11-01: qty 1

## 2015-11-01 MED ORDER — ALUM & MAG HYDROXIDE-SIMETH 200-200-20 MG/5ML PO SUSP: 15.0000 mL | ORAL | Status: DC | PRN

## 2015-11-01 MED ORDER — LACTATED RINGERS IV SOLN
INTRAVENOUS | Status: DC | PRN
Start: 2015-11-01 — End: 2015-11-01
  Administered 2015-11-01: 15:00:00 via INTRAVENOUS

## 2015-11-01 MED ORDER — OXYCODONE HCL 5 MG/5ML PO SOLN: 5.0000 mg | Freq: Once | ORAL | Status: AC | PRN

## 2015-11-01 MED ORDER — DOCUSATE SODIUM 100 MG PO CAPS
100.0000 mg | ORAL_CAPSULE | Freq: Every day | ORAL | Status: DC
  Administered 2015-11-02 – 2015-11-10 (×8): 100 mg via ORAL
  Filled 2015-11-01 (×11): qty 1

## 2015-11-01 MED ORDER — PHENOL 1.4 % MT LIQD
1.0000 | OROMUCOSAL | Status: DC | PRN
Start: 2015-11-01 — End: 2015-11-12
  Filled 2015-11-01: qty 177

## 2015-11-01 MED ORDER — FAMOTIDINE 20 MG PO TABS
20.0000 mg | ORAL_TABLET | Freq: Once | ORAL | Status: AC
  Administered 2015-11-01: 20 mg via ORAL

## 2015-11-01 MED ORDER — TIOTROPIUM BROMIDE MONOHYDRATE 18 MCG IN CAPS
18.0000 ug | ORAL_CAPSULE | Freq: Every day | RESPIRATORY_TRACT | Status: DC
  Administered 2015-11-02 – 2015-11-12 (×11): 18 ug via RESPIRATORY_TRACT
  Filled 2015-11-01 (×3): qty 5

## 2015-11-01 MED ORDER — OXYCODONE-ACETAMINOPHEN 5-325 MG PO TABS
1.0000 | ORAL_TABLET | ORAL | Status: DC | PRN
  Administered 2015-11-02 (×2): 2 via ORAL
  Administered 2015-11-02: 1 via ORAL
  Administered 2015-11-03 – 2015-11-04 (×8): 2 via ORAL
  Administered 2015-11-05: 1 via ORAL
  Administered 2015-11-05 (×2): 2 via ORAL
  Administered 2015-11-06 (×2): 1 via ORAL
  Administered 2015-11-06 – 2015-11-07 (×2): 2 via ORAL
  Administered 2015-11-08: 1 via ORAL
  Administered 2015-11-08: 2 via ORAL
  Administered 2015-11-09 (×2): 1 via ORAL
  Administered 2015-11-10 (×2): 2 via ORAL
  Administered 2015-11-11: 1 via ORAL
  Administered 2015-11-11: 2 via ORAL
  Filled 2015-11-01 (×4): qty 2
  Filled 2015-11-01 (×2): qty 1
  Filled 2015-11-01 (×2): qty 2
  Filled 2015-11-01: qty 1
  Filled 2015-11-01: qty 2
  Filled 2015-11-01: qty 1
  Filled 2015-11-01 (×2): qty 2
  Filled 2015-11-01: qty 1
  Filled 2015-11-01 (×7): qty 2
  Filled 2015-11-01 (×3): qty 1
  Filled 2015-11-01 (×2): qty 2

## 2015-11-01 MED ORDER — PROPOFOL 10 MG/ML IV BOLUS
INTRAVENOUS | Status: DC | PRN
  Administered 2015-11-01: 150 mg via INTRAVENOUS

## 2015-11-01 MED ORDER — ACETAMINOPHEN 325 MG PO TABS
325.0000 mg | ORAL_TABLET | ORAL | Status: DC | PRN
  Administered 2015-11-09: 650 mg via ORAL
  Filled 2015-11-01: qty 2

## 2015-11-01 MED ORDER — HYDROMORPHONE HCL 1 MG/ML IJ SOLN
0.2500 mg | INTRAMUSCULAR | Status: DC | PRN
  Administered 2015-11-01 (×4): 0.5 mg via INTRAVENOUS

## 2015-11-01 MED ORDER — SORBITOL 70 % SOLN
30.0000 mL | Freq: Every day | Status: DC | PRN
  Filled 2015-11-01: qty 30

## 2015-11-01 MED ORDER — LIDOCAINE HCL (PF) 1 % IJ SOLN
INTRAMUSCULAR | Status: AC
  Filled 2015-11-01: qty 30

## 2015-11-01 MED ORDER — POTASSIUM CHLORIDE CRYS ER 20 MEQ PO TBCR
20.0000 meq | EXTENDED_RELEASE_TABLET | Freq: Every day | ORAL | Status: DC
  Administered 2015-11-02 – 2015-11-12 (×11): 20 meq via ORAL
  Filled 2015-11-01 (×5): qty 2
  Filled 2015-11-01: qty 1
  Filled 2015-11-01 (×2): qty 2
  Filled 2015-11-01: qty 1
  Filled 2015-11-01 (×3): qty 2
  Filled 2015-11-01 (×5): qty 1

## 2015-11-01 MED ORDER — GUAIFENESIN-DM 100-10 MG/5ML PO SYRP: 15.0000 mL | ORAL_SOLUTION | ORAL | Status: DC | PRN

## 2015-11-01 MED ORDER — MORPHINE SULFATE (PF) 2 MG/ML IV SOLN
2.0000 mg | INTRAVENOUS | Status: DC | PRN
  Administered 2015-11-01: 4 mg via INTRAVENOUS
  Administered 2015-11-01 – 2015-11-02 (×2): 2 mg via INTRAVENOUS
  Administered 2015-11-03: 4 mg via INTRAVENOUS
  Filled 2015-11-01 (×3): qty 2
  Filled 2015-11-01: qty 1

## 2015-11-01 MED ORDER — METOPROLOL TARTRATE 1 MG/ML IV SOLN: 2.0000 mg | INTRAVENOUS | Status: DC | PRN

## 2015-11-01 MED ORDER — MAGNESIUM SULFATE 2 GM/50ML IV SOLN
2.0000 g | Freq: Every day | INTRAVENOUS | Status: DC | PRN
  Filled 2015-11-01: qty 50

## 2015-11-01 MED ORDER — ONDANSETRON HCL 4 MG/2ML IJ SOLN: 4.0000 mg | Freq: Four times a day (QID) | INTRAMUSCULAR | Status: DC | PRN

## 2015-11-01 MED ORDER — SALINE SPRAY 0.65 % NA SOLN
1.0000 | NASAL | Status: DC | PRN
  Filled 2015-11-01: qty 44

## 2015-11-01 MED ORDER — CEFAZOLIN SODIUM-DEXTROSE 2-3 GM-% IV SOLR
2.0000 g | INTRAVENOUS | Status: AC
  Administered 2015-11-01: 2 g via INTRAVENOUS

## 2015-11-01 MED ORDER — MIDAZOLAM HCL 5 MG/5ML IJ SOLN
INTRAMUSCULAR | Status: DC | PRN
  Administered 2015-11-01: 2 mg via INTRAVENOUS

## 2015-11-01 MED ORDER — LIDOCAINE HCL (CARDIAC) 20 MG/ML IV SOLN
INTRAVENOUS | Status: DC | PRN
  Administered 2015-11-01: 60 mg via INTRAVENOUS

## 2015-11-01 MED ORDER — ACETAMINOPHEN 325 MG RE SUPP: 325.0000 mg | RECTAL | Status: DC | PRN

## 2015-11-01 MED ORDER — OXYCODONE-ACETAMINOPHEN 5-325 MG PO TABS: 2.0000 | ORAL_TABLET | ORAL | Status: DC | PRN

## 2015-11-01 MED ORDER — FUROSEMIDE 40 MG PO TABS
40.0000 mg | ORAL_TABLET | Freq: Two times a day (BID) | ORAL | Status: DC
  Administered 2015-11-02 – 2015-11-12 (×21): 40 mg via ORAL
  Filled 2015-11-01 (×22): qty 1

## 2015-11-01 SURGICAL SUPPLY — 55 items
BANDAGE ELASTIC 6 LF NS (GAUZE/BANDAGES/DRESSINGS) ×3 IMPLANT
BLADE SAGITTAL WIDE XTHICK NO (BLADE) ×3 IMPLANT
BLADE SAW GIGLI 510 (BLADE) ×1 IMPLANT
BLADE SAW GIGLI 510MM (BLADE)
BNDG CMPR MED 5X6 ELC HKLP NS (GAUZE/BANDAGES/DRESSINGS) ×1
BNDG COHESIVE 4X5 TAN STRL (GAUZE/BANDAGES/DRESSINGS) ×1 IMPLANT
BNDG GAUZE 4.5X4.1 6PLY STRL (MISCELLANEOUS) ×6 IMPLANT
BRUSH SCRUB 4% CHG (MISCELLANEOUS) ×1 IMPLANT
CANISTER SUCT 1200ML W/VALVE (MISCELLANEOUS) ×3 IMPLANT
CHLORAPREP W/TINT 26ML (MISCELLANEOUS) ×3 IMPLANT
DRAPE INCISE IOBAN 66X45 STRL (DRAPES) ×3 IMPLANT
DRAPE INCISE IOBAN 66X60 STRL (DRAPES) ×3 IMPLANT
DRAPE LAPAROTOMY T 102X78X121 (DRAPES) ×3 IMPLANT
DRSG VAC ATS MED SENSATRAC (GAUZE/BANDAGES/DRESSINGS) ×1 IMPLANT
DURAPREP 26ML APPLICATOR (WOUND CARE) ×3 IMPLANT
ELECT CAUTERY BLADE 6.4 (BLADE) ×3 IMPLANT
ELECT REM PT RETURN 9FT ADLT (ELECTROSURGICAL) ×3
ELECTRODE REM PT RTRN 9FT ADLT (ELECTROSURGICAL) ×1 IMPLANT
GAUZE PETRO XEROFOAM 1X8 (MISCELLANEOUS) ×6 IMPLANT
GLOVE BIO SURGEON STRL SZ7 (GLOVE) ×6 IMPLANT
GLOVE INDICATOR 7.5 STRL GRN (GLOVE) ×3 IMPLANT
GOWN STRL REUS W/ TWL LRG LVL3 (GOWN DISPOSABLE) ×3 IMPLANT
GOWN STRL REUS W/ TWL XL LVL3 (GOWN DISPOSABLE) ×1 IMPLANT
GOWN STRL REUS W/TWL LRG LVL3 (GOWN DISPOSABLE) ×9
GOWN STRL REUS W/TWL XL LVL3 (GOWN DISPOSABLE) ×3
HANDLE YANKAUER SUCT BULB TIP (MISCELLANEOUS) ×3 IMPLANT
HANDPIECE SUCTION TUBG SURGILV (MISCELLANEOUS) ×1 IMPLANT
IV NS 1000ML (IV SOLUTION) ×3
IV NS 1000ML BAXH (IV SOLUTION) ×1 IMPLANT
KIT RM TURNOVER STRD PROC AR (KITS) ×3 IMPLANT
LABEL OR SOLS (LABEL) ×3 IMPLANT
NS IRRIG 1000ML POUR BTL (IV SOLUTION) ×3 IMPLANT
NS IRRIG 500ML POUR BTL (IV SOLUTION) ×3 IMPLANT
PACK BASIN MINOR ARMC (MISCELLANEOUS) ×3 IMPLANT
PACK EXTREMITY ARMC (MISCELLANEOUS) ×3 IMPLANT
PAD ABD DERMACEA PRESS 5X9 (GAUZE/BANDAGES/DRESSINGS) ×6 IMPLANT
PAD PREP 24X41 OB/GYN DISP (PERSONAL CARE ITEMS) ×3 IMPLANT
SOL PREP PVP 2OZ (MISCELLANEOUS) ×3
SOLUTION PREP PVP 2OZ (MISCELLANEOUS) ×1 IMPLANT
SPONGE LAP 18X18 5 PK (GAUZE/BANDAGES/DRESSINGS) ×7 IMPLANT
STAPLER SKIN PROX 35W (STAPLE) ×5 IMPLANT
STOCKINETTE M/LG 89821 (MISCELLANEOUS) ×3 IMPLANT
SUT ETHILON 4-0 (SUTURE)
SUT ETHILON 4-0 FS2 18XMFL BLK (SUTURE)
SUT SILK 2 0 (SUTURE) ×12
SUT SILK 2 0 SH (SUTURE) ×10 IMPLANT
SUT SILK 2-0 18XBRD TIE 12 (SUTURE) ×1 IMPLANT
SUT VIC AB 0 CT1 36 (SUTURE) ×10 IMPLANT
SUT VIC AB 2-0 CT1 (SUTURE) ×8 IMPLANT
SUT VIC AB 3-0 SH 27 (SUTURE) ×6
SUT VIC AB 3-0 SH 27X BRD (SUTURE) ×2 IMPLANT
SUTURE ETHLN 4-0 FS2 18XMF BLK (SUTURE) ×1 IMPLANT
SWAB CULTURE AMIES ANAERIB BLU (MISCELLANEOUS) ×1 IMPLANT
SYR BULB EAR ULCER 3OZ GRN STR (SYRINGE) ×3 IMPLANT
WND VAC CANISTER 500ML (MISCELLANEOUS) ×3 IMPLANT

## 2015-11-01 NOTE — Anesthesia Procedure Notes (Signed)
Procedure Name: LMA Insertion Date/Time: 11/01/2015 3:40 PM Performed by: Dionne Bucy Pre-anesthesia Checklist: Patient identified, Patient being monitored, Timeout performed, Emergency Drugs available and Suction available Patient Re-evaluated:Patient Re-evaluated prior to inductionOxygen Delivery Method: Circle system utilized Preoxygenation: Pre-oxygenation with 100% oxygen Intubation Type: IV induction Ventilation: Mask ventilation without difficulty LMA: LMA inserted LMA Size: 4.5 Tube type: Oral Number of attempts: 1 Placement Confirmation: positive ETCO2 and breath sounds checked- equal and bilateral Tube secured with: Tape Dental Injury: Teeth and Oropharynx as per pre-operative assessment

## 2015-11-01 NOTE — Transfer of Care (Signed)
Immediate Anesthesia Transfer of Care Note  Patient: Timothy Juarez  Procedure(s) Performed: Procedure(s): AMPUTATION ABOVE KNEE (Left)  Patient Location: PACU  Anesthesia Type:General  Level of Consciousness: sedated  Airway & Oxygen Therapy: Patient Spontanous Breathing and Patient connected to face mask oxygen  Post-op Assessment: Report given to RN  Post vital signs: Reviewed and stable  Last Vitals:  Filed Vitals:   11/01/15 1350 11/01/15 1657  BP: 112/64 111/69  Pulse: 82 84  Temp: 36.7 C 37.3 C  Resp: 16 25    Complications: No apparent anesthesia complications

## 2015-11-01 NOTE — Anesthesia Preprocedure Evaluation (Signed)
Anesthesia Evaluation  Patient identified by MRN, date of birth, ID band Patient awake    Reviewed: Allergy & Precautions, H&P , NPO status , Patient's Chart, lab work & pertinent test results  History of Anesthesia Complications Negative for: history of anesthetic complications  Airway Mallampati: III  TM Distance: >3 FB Neck ROM: limited    Dental  (+) Poor Dentition, Chipped, Missing   Pulmonary shortness of breath and with exertion, COPD, former smoker,    Pulmonary exam normal breath sounds clear to auscultation       Cardiovascular Exercise Tolerance: Poor hypertension, (-) angina+ Peripheral Vascular Disease, +CHF, + Orthopnea and + DOE  Normal cardiovascular exam Rhythm:regular Rate:Normal     Neuro/Psych  Neuromuscular disease negative psych ROS   GI/Hepatic negative GI ROS, Neg liver ROS,   Endo/Other  negative endocrine ROS  Renal/GU negative Renal ROS  negative genitourinary   Musculoskeletal   Abdominal   Peds  Hematology negative hematology ROS (+)   Anesthesia Other Findings Past Medical History:   Hypertension                                                 Peripheral vascular disease (HCC)                            CHF (congestive heart failure) (HCC)                         COPD (chronic obstructive pulmonary disease) (*              Hypokalemia                                                  Insomnia                                                     Pressure ulcer                                               ARF (acute respiratory failure) (HCC)                          Comment:H/O   Muscle contracture                                             Comment:MUSCLE SPASMS  Past Surgical History:   PERIPHERAL VASCULAR CATHETERIZATION             Left 01/18/2015       Comment:Procedure: Lower Extremity Angiography;                Surgeon: Algernon Huxley, MD;  Location: [place] [facility];  Service: Cardiovascular;                Laterality: Left;   PERIPHERAL VASCULAR CATHETERIZATION             N/A 01/18/2015       Comment:Procedure: Lower Extremity Intervention;                Surgeon: Algernon Huxley, MD;  Location: [place] [facility];  Service: Cardiovascular;                Laterality: N/A;   PERIPHERAL VASCULAR CATHETERIZATION              07/30/2015     Comment:Procedure: Lower Extremity Intervention;                Surgeon: Algernon Huxley, MD;  Location: [place] [facility];  Service: Cardiovascular;;   PERIPHERAL VASCULAR CATHETERIZATION             N/A 07/30/2015     Comment:Procedure: Abdominal Aortogram w/Lower               Extremity;  Surgeon: Algernon Huxley, MD;                Location: [place] [facility];  Service:               Cardiovascular;  Laterality: N/A;   PERIPHERAL VASCULAR CATHETERIZATION             Left 08/22/2015       Comment:Procedure: Lower Extremity Angiography;                Surgeon: Algernon Huxley, MD;  Location: [place] [facility];  Service: Cardiovascular;                Laterality: Left;   PERIPHERAL VASCULAR CATHETERIZATION             Left 08/22/2015       Comment:Procedure: Lower Extremity Intervention;                Surgeon: Algernon Huxley, MD;  Location: [place] [facility];  Service: Cardiovascular;                Laterality: Left;   AMPUTATION                                      Left 09/19/2015       Comment:Procedure: AMPUTATION BELOW KNEE;  Surgeon:               Algernon Huxley, MD;  Location: [facility];  Service:              Vascular;  Laterality: Left;   WOUND DEBRIDEMENT                               Left 10/18/2015       Comment:Procedure: DEBRIDEMENT WOUND   ( LEFT BKA  DEBRIDEMENT );  Surgeon: Algernon Huxley, MD;                Location: [facility];  Service: Vascular;                Laterality: Left;   APPLICATION OF WOUND VAC                         Left 10/18/2015       Comment:Procedure: APPLICATION OF WOUND VAC;  Surgeon:               Algernon Huxley, MD;  Location: [facility];  Service:              Vascular;  Laterality: Left;     Reproductive/Obstetrics negative OB ROS                             Anesthesia Physical Anesthesia Plan  ASA: IV  Anesthesia Plan: General ETT   Post-op Pain Management:    Induction:   Airway Management Planned:   Additional Equipment:   Intra-op Plan:   Post-operative Plan:   Informed Consent: I have reviewed the patients History and Physical, chart, labs and discussed the procedure including the risks, benefits and alternatives for the proposed anesthesia with the patient or authorized representative who has indicated his/her understanding and acceptance.   Dental Advisory Given  Plan Discussed with: Anesthesiologist, CRNA and Surgeon  Anesthesia Plan Comments:         Anesthesia Quick Evaluation

## 2015-11-01 NOTE — Anesthesia Postprocedure Evaluation (Signed)
Anesthesia Post Note  Patient: Timothy Juarez  Procedure(s) Performed: Procedure(s) (LRB): AMPUTATION ABOVE KNEE (Left)  Patient location during evaluation: PACU Anesthesia Type: General Level of consciousness: awake and alert Pain management: pain level controlled Vital Signs Assessment: post-procedure vital signs reviewed and stable Respiratory status: spontaneous breathing, nonlabored ventilation, respiratory function stable and patient connected to nasal cannula oxygen Cardiovascular status: blood pressure returned to baseline and stable Postop Assessment: no signs of nausea or vomiting Anesthetic complications: no    Last Vitals:  Filed Vitals:   11/01/15 1814 11/01/15 1857  BP: 164/76 158/80  Pulse: 103 111  Temp:  36.8 C  Resp:  17    Last Pain:  Filed Vitals:   11/01/15 1857  PainSc: [place]

## 2015-11-01 NOTE — H&P (Signed)
  [facility] History & Physical Update  The patient was interviewed and re-examined.  The patient's previous History and Physical has been reviewed and is unchanged.  There is no change in the plan of care. We plan to proceed with the scheduled procedure.  DEW,JASON, MD  11/01/2015, 2:12 PM

## 2015-11-01 NOTE — Op Note (Signed)
   Vein  and Vascular Surgery   OPERATIVE NOTE   PROCEDURE: left above-the-knee amputation  PRE-OPERATIVE DIAGNOSIS: left foot gangrene s/p left BKA with wound breakdown and necrosis  POST-OPERATIVE DIAGNOSIS: same as above  SURGEON:  [name], MD  ASSISTANT(S): Hezzie Bump, PA-C  ANESTHESIA: general  ESTIMATED BLOOD LOSS: 150 cc  FINDING(S): Viable tissue above the knee, not enough viable tissue to salvage the BKA  SPECIMEN(S):  left above-the-knee amputation  INDICATIONS:   Timothy Juarez is a 65 y.o. male who presents with left leg gangrene. He had a BKA but has opening of his wound with necrotic muscle and exposed bone and not enough tissue to salvage the BKA.  The patient is scheduled for a left above-the-knee amputation.  I discussed in depth with the patient the risks, benefits, and alternatives to this procedure.  The patient is aware that the risk of this operation included but are not limited to:  bleeding, infection, myocardial infarction, stroke, death, failure to heal amputation wound, and possible need for more proximal amputation.  The patient is aware of the risks and agrees proceed forward with the procedure.  DESCRIPTION: After full informed written consent was obtained from the patient, the patient was taken to the operating room, and placed supine upon the operating table.  Prior to induction, the patient received IV antibiotics.  The patient was then prepped and draped in the standard fashion for an above-the-knee amputation.  After obtaining adequate anesthesia, the patient was prepped and draped in the standard fashion for a above-the-knee amputation.  I marked out the anterior and posterior flaps for a fish-mouth type of amputation.  I made the incisions for these flaps, and then dissected through the subcutaneous tissue, fascia, and muscles circumferentially.  I elevated  the periosteal tissue 4-5 cm more proximal than the anterior skin flap.  I  then transected the femur with a power saw at this level.  Then I smoothed out the rough edges of the bone.  At this point, the specimen was passed off the field as the above-the-knee amputation.  At this point, I clamped all visibly bleeding arteries and veins using a combination of suture ligation with silk suture and electrocautery.   Bleeding continued to be controlled with electrocautery and suture ligature.  The stump was washed off with sterile normal saline and no further active bleeding was noted.  I reapproximated the anterior and posterior fascia  with interrupted stitches of 0 Vicryl.  This was completed along the entire length of anterior and posterior fascia until there were no more loose space in the fascial line. The subcutaneous tissue was then approximated with 2-0 vicryl sutures. The skin was then  reapproximated with staples.  The stump was washed off and dried.  The incision was dressed with Xeroform and ABD pads, and  then fluffs were applied.  Kerlix was wrapped around the leg and then gently an ACE wrap was applied.  A large Ioban was then placed over the ACE wrap to secure the dressing. The patient was then awakened and take to the recovery room in stable condition.   COMPLICATIONS: none  CONDITION: stable  DEW,JASON  11/01/2015, 4:47 PM

## 2015-11-01 NOTE — Progress Notes (Signed)
Discussed medications and last dosages with [place] via tele @2pm  [phone]).  Advises meds given w/sip water only and that patient has been npo otherwise since midnight

## 2015-11-02 ENCOUNTER — Encounter: Payer: Self-pay | Admitting: Vascular Surgery

## 2015-11-02 LAB — CBC WITH DIFFERENTIAL/PLATELET
BASOS ABS: 0.2 10*3/uL — AB (ref 0–0.1)
Basophils Relative: 1 %
EOS ABS: 0 10*3/uL (ref 0–0.7)
Eosinophils Relative: 0 %
HCT: 23.8 % — ABNORMAL LOW (ref 40.0–52.0)
HEMOGLOBIN: 7.9 g/dL — AB (ref 13.0–18.0)
LYMPHS PCT: 21 %
Lymphs Abs: 3.6 10*3/uL (ref 1.0–3.6)
MCH: 24.7 pg — ABNORMAL LOW (ref 26.0–34.0)
MCHC: 33.1 g/dL (ref 32.0–36.0)
MCV: 74.5 fL — ABNORMAL LOW (ref 80.0–100.0)
MONOS PCT: 13 %
Monocytes Absolute: 2.2 10*3/uL — ABNORMAL HIGH (ref 0.2–1.0)
NEUTROS ABS: 11.2 10*3/uL — AB (ref 1.4–6.5)
NEUTROS PCT: 65 %
PLATELETS: 273 10*3/uL (ref 150–440)
RBC: 3.19 MIL/uL — AB (ref 4.40–5.90)
RDW: 18.1 % — ABNORMAL HIGH (ref 11.5–14.5)
WBC: 17.2 10*3/uL — ABNORMAL HIGH (ref 3.8–10.6)

## 2015-11-02 LAB — CBC
HCT: 19.9 % — ABNORMAL LOW (ref 40.0–52.0)
Hemoglobin: 6.2 g/dL — ABNORMAL LOW (ref 13.0–18.0)
MCH: 22 pg — ABNORMAL LOW (ref 26.0–34.0)
MCHC: 31.2 g/dL — ABNORMAL LOW (ref 32.0–36.0)
MCV: 70.6 fL — AB (ref 80.0–100.0)
PLATELETS: 373 10*3/uL (ref 150–440)
RBC: 2.82 MIL/uL — AB (ref 4.40–5.90)
RDW: 18 % — ABNORMAL HIGH (ref 11.5–14.5)
WBC: 15.9 10*3/uL — AB (ref 3.8–10.6)

## 2015-11-02 LAB — BASIC METABOLIC PANEL
Anion gap: 4 — ABNORMAL LOW (ref 5–15)
BUN: 19 mg/dL (ref 6–20)
CHLORIDE: 111 mmol/L (ref 101–111)
CO2: 23 mmol/L (ref 22–32)
CREATININE: 0.99 mg/dL (ref 0.61–1.24)
Calcium: 7.7 mg/dL — ABNORMAL LOW (ref 8.9–10.3)
Glucose, Bld: 150 mg/dL — ABNORMAL HIGH (ref 65–99)
Potassium: 3.9 mmol/L (ref 3.5–5.1)
SODIUM: 138 mmol/L (ref 135–145)

## 2015-11-02 LAB — PREPARE RBC (CROSSMATCH)

## 2015-11-02 LAB — HEMOGLOBIN AND HEMATOCRIT, BLOOD
HEMATOCRIT: 22.3 % — AB (ref 40.0–52.0)
HEMOGLOBIN: 7 g/dL — AB (ref 13.0–18.0)

## 2015-11-02 MED ORDER — SODIUM CHLORIDE 0.9 % IV SOLN: Freq: Once | INTRAVENOUS | Status: DC

## 2015-11-02 MED ORDER — SODIUM CHLORIDE 0.9 % IV SOLN
Freq: Once | INTRAVENOUS | Status: AC
  Administered 2015-11-02: 10:00:00 via INTRAVENOUS

## 2015-11-02 NOTE — NC FL2 (Signed)
[place] LEVEL OF CARE SCREENING TOOL     IDENTIFICATION  Patient Name: Timothy Juarez Birthdate: 1951-08-16 Sex: male Admission Date (Current Location): 11/01/2015  [facility] and [place] Number:  Engineering geologist and Address:  [facility], [address] [place], [place] [address]      Provider Number: [id number]  Attending Physician Name and Address:  Algernon Huxley, MD  Relative Name and Phone Number:       Current Level of Care: Hospital Recommended Level of Care: [facility] Prior Approval Number:    Date Approved/Denied:   PASRR Number:    Discharge Plan: SNF    Current Diagnoses: Patient Active Problem List   Diagnosis Date Noted  . Atherosclerosis of native arteries of extremities with gangrene, left leg ([place]) 11/01/2015  . Status post below knee amputation ([place]) 09/20/2015  . Ischemia of lower extremity 09/14/2015  . Atherosclerotic peripheral vascular disease with ulceration ([place]) 01/18/2015    Orientation RESPIRATION BLADDER Height & Weight     Self, Time, Situation, Place  Normal Continent Weight: 190 lb (86.183 kg) Height:  6' (182.9 cm)  BEHAVIORAL SYMPTOMS/MOOD NEUROLOGICAL BOWEL NUTRITION STATUS   (none)  (none) Continent Diet (regular)  AMBULATORY STATUS COMMUNICATION OF NEEDS Skin   Total Care Verbally Surgical wounds                       Personal Care Assistance Level of Assistance  Dressing, Bathing Bathing Assistance: Limited assistance   Dressing Assistance: Limited assistance     Functional Limitations Info             SPECIAL CARE FACTORS FREQUENCY                       Contractures Contractures Info: Not present    Additional Factors Info                  Current Medications (11/02/2015):  This is the current hospital active medication list Current Facility-Administered Medications  Medication Dose Route Frequency Provider Last Rate Last Dose  .  0.9 %  sodium chloride infusion   Intravenous Continuous Algernon Huxley, MD 75 mL/hr at 11/02/15 [phone]    . 0.9 %  sodium chloride infusion   Intravenous Once Algernon Huxley, MD      . acetaminophen (TYLENOL) tablet 325-650 mg  325-650 mg Oral Q4H PRN Algernon Huxley, MD       Or  . acetaminophen (TYLENOL) suppository 325-650 mg  325-650 mg Rectal Q4H PRN Algernon Huxley, MD      . acetaminophen (TYLENOL) tablet 650 mg  650 mg Oral QHS Algernon Huxley, MD   650 mg at 11/01/15 2255  . alum & mag hydroxide-simeth (MAALOX/MYLANTA) 200-200-20 MG/5ML suspension 15-30 mL  15-30 mL Oral Q2H PRN Algernon Huxley, MD      . apixaban Arne [place]) tablet 2.5 mg  2.5 mg Oral BID Algernon Huxley, MD   2.5 mg at 11/02/15 0945  . aspirin EC tablet 81 mg  81 mg Oral Daily Algernon Huxley, MD   81 mg at 11/02/15 0944  . baclofen (LIORESAL) tablet 5 mg  5 mg Oral TID Algernon Huxley, MD   5 mg at 11/02/15 0944  . docusate sodium (COLACE) capsule 100 mg  100 mg Oral Daily Algernon Huxley, MD   100 mg at 11/02/15 0944  . doxycycline (  VIBRA-TABS) tablet 100 mg  100 mg Oral Q12H Algernon Huxley, MD   100 mg at 11/02/15 0944  . famotidine (PEPCID) IVPB 20 mg premix  20 mg Intravenous Q12H Algernon Huxley, MD   20 mg at 11/02/15 1224  . furosemide (LASIX) tablet 40 mg  40 mg Oral BID Algernon Huxley, MD   40 mg at 11/02/15 0944  . gabapentin (NEURONTIN) capsule 100 mg  100 mg Oral QHS Algernon Huxley, MD   100 mg at 11/01/15 2256  . guaiFENesin-dextromethorphan (ROBITUSSIN DM) 100-10 MG/5ML syrup 15 mL  15 mL Oral Q4H PRN Algernon Huxley, MD      . hydrALAZINE (APRESOLINE) injection 5 mg  5 mg Intravenous Q20 Min PRN Algernon Huxley, MD      . labetalol (NORMODYNE,TRANDATE) injection 10 mg  10 mg Intravenous Q10 min PRN Algernon Huxley, MD      . magnesium sulfate IVPB 2 g 50 mL  2 g Intravenous Daily PRN Algernon Huxley, MD      . metoprolol (LOPRESSOR) injection 2-5 mg  2-5 mg Intravenous Q2H PRN Algernon Huxley, MD      . metoprolol tartrate (LOPRESSOR) tablet 12.5 mg  12.5 mg Oral BID Algernon Huxley, MD   12.5 mg at 11/02/15 0945  . mometasone-formoterol (DULERA) 200-5 MCG/ACT inhaler 2 puff  2 puff Inhalation BID Algernon Huxley, MD   2 puff at 11/02/15 0933  . morphine 2 MG/ML injection 2-5 mg  2-5 mg Intravenous Q1H PRN Algernon Huxley, MD   4 mg at 11/01/15 2250  . ondansetron (ZOFRAN) injection 4 mg  4 mg Intravenous Q6H PRN Algernon Huxley, MD      . oxyCODONE-acetaminophen (PERCOCET/ROXICET) 5-325 MG per tablet 1-2 tablet  1-2 tablet Oral Q4H PRN Algernon Huxley, MD   1 tablet at 11/02/15 0700  . phenol (CHLORASEPTIC) mouth spray 1 spray  1 spray Mouth/Throat PRN Algernon Huxley, MD      . polyethylene glycol (MIRALAX / GLYCOLAX) packet 17 g  17 g Oral Daily PRN Algernon Huxley, MD      . potassium chloride (K-DUR) CR tablet 20 mEq  20 mEq Oral Daily Algernon Huxley, MD   20 mEq at 11/02/15 0945  . potassium chloride SA (K-DUR,KLOR-CON) CR tablet 20-40 mEq  20-40 mEq Oral Daily PRN Algernon Huxley, MD      . sodium chloride (OCEAN) 0.65 % nasal spray 1 spray  1 spray Each Nare PRN Algernon Huxley, MD      . sorbitol 70 % solution 30 mL  30 mL Oral Daily PRN Algernon Huxley, MD      . temazepam (RESTORIL) capsule 15 mg  15 mg Oral QHS PRN Algernon Huxley, MD      . tiotropium [facility]) inhalation capsule 18 mcg  18 mcg Inhalation Daily Algernon Huxley, MD   18 mcg at 11/02/15 [id number]     Discharge Medications: Please see discharge summary for a list of discharge medications.  Relevant Imaging Results:  Relevant Lab Results:   Additional Information    Shela Leff, LCSW

## 2015-11-02 NOTE — Evaluation (Signed)
Physical Therapy Evaluation Patient Details Name: Timothy Juarez MRN: [id number] DOB: 06-09-1951 Today's Date: 11/02/2015   History of Present Illness  Pt is admitted for L LE amputation. Pt initially with L foot gangere->BKA and is now s/p AKA secondary to wound complications. Pt currently receving blood transfusion with Hgb at 6.2.  Clinical Impression  Pt is a pleasant 65 year old male who was admitted for L LE AKA. Pt unable to perform OOB mobility at this time secondary to medical reason. Pt very anxious with all movements reporting cramping on L LE as well as phantom limb pain. Pt demonstrates deficits with strength/pain/mobility. Good endurance with there-ex and progress as able for transfers in/out of recliner. Would benefit from skilled PT to address above deficits and promote optimal return to PLOF; recommend transition to STR upon discharge from acute hospitalization.       Follow Up Recommendations SNF    Equipment Recommendations       Recommendations for Other Services       Precautions / Restrictions Precautions Precautions: Fall Restrictions Weight Bearing Restrictions: Yes [name] Weight Bearing: Non weight bearing      Mobility  Bed Mobility               General bed mobility comments: unable at this time as pt receiving blood transfusion  Transfers                    Ambulation/Gait                Stairs            Wheelchair Mobility    Modified Rankin (Stroke Patients Only)       Balance                                             Pertinent Vitals/Pain Pain Assessment: 0-10 Pain Score: 8  Pain Location: L LE Pain Descriptors / Indicators: Operative site guarding Pain Intervention(s): Limited activity within patient's tolerance    Home Living Family/patient expects to be discharged to:: Assisted living               Home Equipment: Walker - 2 wheels;Cane - quad;Wheelchair -  manual Additional Comments: lives at [facility] in [place]    Prior Function Level of Independence: Independent with assistive device(s)         Comments: Pt transfers in/out WC independently, however does not walk anymore     Hand Dominance        Extremity/Trunk Assessment   Upper Extremity Assessment: Overall WFL for tasks assessed           Lower Extremity Assessment: Generalized weakness (L LE grossly 2/5; R LE grossly 4+/5)         Communication   Communication: No difficulties  Cognition Arousal/Alertness: Awake/alert Behavior During Therapy: WFL for tasks assessed/performed Overall Cognitive Status: Within Functional Limits for tasks assessed                      General Comments      Exercises Other Exercises Other Exercises: Supine ther-ex performed including L LE isometrics including hip add squeezes, hip extension and R LE quad sets and ankle pumps. All ther-ex performed x 10 reps with cues for correct technique. Supervision given for exercises as well as further education  on positioning in bed and avoidance of contractures.      Assessment/Plan    PT Assessment Patient needs continued PT services  PT Diagnosis Difficulty walking;Generalized weakness;Acute pain   PT Problem List Decreased strength;Decreased activity tolerance;Decreased balance;Decreased mobility;Decreased knowledge of use of DME;Pain  PT Treatment Interventions DME instruction;Therapeutic exercise   PT Goals (Current goals can be found in the Care Plan section) Acute Rehab PT Goals Patient Stated Goal: to get stronger PT Goal Formulation: With patient Time For Goal Achievement: 11/16/15 Potential to Achieve Goals: Good    Frequency 7X/week   Barriers to discharge        Co-evaluation               End of Session   Activity Tolerance: Patient tolerated treatment well Patient left: in bed;with bed alarm set Nurse Communication: Mobility status          Time: [id number] PT Time Calculation (min) (ACUTE ONLY): 26 min   Charges:   PT Evaluation $PT Eval Moderate Complexity: 1 Procedure PT Treatments $Therapeutic Exercise: 8-22 mins   PT G Codes:        Foday Cone 13-Nov-2015, 5:04 PM  Greggory Stallion, PT, DPT [phone]

## 2015-11-02 NOTE — Progress Notes (Signed)
OT Cancellation Note  Patient Details Name: JAIVON NICOLOFF MRN: [id number] DOB: June 04, 1951   Cancelled Treatment:    Reason Eval/Treat Not Completed: Other (comment) (Pt. with low hemoglobin. Pt. was preparing to receive an additional unit of blood upon arrival. Will attempt  Eval on the next available treatment day.  Harrel Carina, MS, OTR/L  Harrel Carina 11/02/2015, 3:51 PM

## 2015-11-02 NOTE — Progress Notes (Signed)
Pharmacy Antibiotic Note  Timothy Juarez is a 65 y.o. male admitted on 11/01/2015 with surgey for AKA  Pharmacy has been consulted for Renal antibiotic dosing.  Plan: Patient currently on Doxycyline 100mg  po Q12h.  Doxycycline does not require renal adjustment  Height: 6' (182.9 cm) Weight: 190 lb (86.183 kg) IBW/kg (Calculated) : 77.6  Temp (24hrs), Avg:98.6 F (37 C), Min:97.9 F (36.6 C), Max:99.5 F (37.5 C)   Recent Labs Lab 10/30/15 0928 11/02/15 0436  WBC 12.2* 15.9*  CREATININE 0.95 0.99    Estimated Creatinine Clearance: 81.6 mL/min (by C-G formula based on Cr of 0.99).    No Known Allergies  Antimicrobials this admission: Cefuroxime x 2 doses Doxycycline  3/16 >>   Dose adjustments this admission:   Microbiology results: BCx: UCx:  Sputum: 3/16 MRSA PCR: neg.  Thank you for allowing pharmacy to be a part of this patient's care.  Mariyah Upshaw A 11/02/2015 3:39 PM

## 2015-11-02 NOTE — Clinical Social Work Note (Signed)
Clinical Social Work Assessment  Patient Details  Name: Timothy Juarez MRN: [id number] Date of Birth: 10-Oct-1950  Date of referral:  11/02/15               Reason for consult:  Facility Placement                Permission sought to share information with:  Facility Art therapist granted to share information::  Yes, Verbal Permission Granted  Name::        Agency::     Relationship::     Contact Information:     Housing/Transportation Living arrangements for the past 2 months:  [place] of Information:  Patient Patient Interpreter Needed:  None Criminal Activity/Legal Involvement Pertinent to Current Situation/Hospitalization:  No - Comment as needed Significant Relationships:  Adult Children Lives with:  Facility Resident Do you feel safe going back to the place where you live?  Yes Need for family participation in patient care:  No (Coment)  Care giving concerns:  None: Patient resides at [facility].   Social Worker assessment / plan:  CSW met with patient and patient was agreeable to speak with CSW but did not make much eye contact. Patient confirmed that he was from [facility] and that he had been there for 3 years. Patient reports he does have friends and family that visit as a support system. Patient is agreeable to return to [facility] when time.  Employment status:  Retired, Disabled (Comment on whether or not currently receiving Disability) Insurance information:    PT Recommendations:  Not assessed at this time Information / Referral to community resources:     Patient/Family's Response to care:  Patient expressed appreciation for CSW visit.   Patient/Family's Understanding of and Emotional Response to Diagnosis, Current Treatment, and Prognosis:  Patient with flat affect and showed little emotion during CSW assessment. Would answer questions willingly but did not initiate any conversation.  Emotional Assessment Appearance:   Appears stated age Attitude/Demeanor/Rapport:   (pleasant and cooperative) Affect (typically observed):  Accepting, Calm Orientation:  Oriented to Self, Oriented to Place, Oriented to  Time, Oriented to Situation Alcohol / Substance use:  Not Applicable Psych involvement (Current and /or in the community):  No (Comment)  Discharge Needs  Concerns to be addressed:  Care Coordination Readmission within the last 30 days:  No Current discharge risk:  None Barriers to Discharge:  No Barriers Identified   Shela Leff, LCSW 11/02/2015, 1:55 PM

## 2015-11-02 NOTE — Progress Notes (Addendum)
[facility]  Daily Progress Note  Subjective: 1 Day Post-Op: left bove-the-knee amputation   Patient without complaint. Resting comfortably in bed. Pain controlled with PO pain medications.   Objective: Filed Vitals:   11/02/15 0531 11/02/15 0922 11/02/15 0954 11/02/15 1210  BP: 95/55 91/54 99/57  110/54  Pulse: 115 120 114 110  Temp: 97.9 F (36.6 C) 98.9 F (37.2 C) 98.6 F (37 C) 98.3 F (36.8 C)  TempSrc: Oral Oral Oral Oral  Resp: 16 18 18 19   Height:      Weight:      SpO2: 93% 100% 96% 97%    Intake/Output Summary (Last 24 hours) at 11/02/15 1323 Last data filed at 11/02/15 1245  Gross per 24 hour  Intake   1875 ml  Output    450 ml  Net   1425 ml   Physical Exam: A&Ox3, NAD CV: RRR Pulmonary: CTA Bilaterally Abdomen: Soft, Nontender, Nondistended Vascular:  Left Lower Extremity: OR dressing in place - clean and dry. Minimal to moderate swelling noted of thigh.    Laboratory: CBC    Component Value Date/Time   WBC 15.9* 11/02/2015 0436   WBC 11.1* 12/01/2013 1317   HGB 6.2* 11/02/2015 0436   HGB 14.3 12/01/2013 1317   HCT 19.9* 11/02/2015 0436   HCT 43.9 12/01/2013 1317   PLT 373 11/02/2015 0436   PLT 250 12/01/2013 1317   BMET    Component Value Date/Time   NA 138 11/02/2015 0436   NA 136 12/01/2013 1317   K 3.9 11/02/2015 0436   K 3.9 12/01/2013 1317   CL 111 11/02/2015 0436   CL 100 12/01/2013 1317   CO2 23 11/02/2015 0436   CO2 33* 12/01/2013 1317   GLUCOSE 150* 11/02/2015 0436   GLUCOSE 91 12/01/2013 1317   BUN 19 11/02/2015 0436   BUN 24* 09/25/2014 0846   CREATININE 0.99 11/02/2015 0436   CREATININE 1.29 09/25/2014 0846   CALCIUM 7.7* 11/02/2015 0436   CALCIUM 9.4 12/01/2013 1317   GFRNONAA >60 11/02/2015 0436   GFRNONAA 60* 09/25/2014 0846   GFRNONAA >60 03/16/2014 0750   GFRAA >60 11/02/2015 0436   GFRAA >60 09/25/2014 0846   GFRAA >60 03/16/2014 0750   Assessment/Planning: The patient is 65 year old  male s/p 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 Discussed with Dr. Lucky Cowboy Will follow up post transfusion Hbg

## 2015-11-03 LAB — CBC
HEMATOCRIT: 22.5 % — AB (ref 40.0–52.0)
HEMOGLOBIN: 7.4 g/dL — AB (ref 13.0–18.0)
MCH: 24.4 pg — AB (ref 26.0–34.0)
MCHC: 32.8 g/dL (ref 32.0–36.0)
MCV: 74.3 fL — ABNORMAL LOW (ref 80.0–100.0)
Platelets: 258 10*3/uL (ref 150–440)
RBC: 3.03 MIL/uL — ABNORMAL LOW (ref 4.40–5.90)
RDW: 18.6 % — AB (ref 11.5–14.5)
WBC: 18.2 10*3/uL — ABNORMAL HIGH (ref 3.8–10.6)

## 2015-11-03 LAB — BASIC METABOLIC PANEL
Anion gap: 3 — ABNORMAL LOW (ref 5–15)
BUN: 25 mg/dL — AB (ref 6–20)
CALCIUM: 7.7 mg/dL — AB (ref 8.9–10.3)
CO2: 22 mmol/L (ref 22–32)
Chloride: 114 mmol/L — ABNORMAL HIGH (ref 101–111)
Creatinine, Ser: 0.84 mg/dL (ref 0.61–1.24)
GFR calc Af Amer: >60 mL/min (ref >60)
GLUCOSE: 120 mg/dL — AB (ref 65–99)
Potassium: 3.5 mmol/L (ref 3.5–5.1)
Sodium: 139 mmol/L (ref 135–145)

## 2015-11-03 LAB — TYPE AND SCREEN
ABO/RH(D): A POS
Antibody Screen: NEGATIVE
UNIT DIVISION: 0
Unit division: 0

## 2015-11-03 LAB — MAGNESIUM: MAGNESIUM: 1.8 mg/dL (ref 1.7–2.4)

## 2015-11-03 MED ORDER — FAMOTIDINE 20 MG PO TABS
20.0000 mg | ORAL_TABLET | Freq: Two times a day (BID) | ORAL | Status: DC
  Administered 2015-11-03 – 2015-11-12 (×18): 20 mg via ORAL
  Filled 2015-11-03 (×18): qty 1

## 2015-11-03 NOTE — Progress Notes (Signed)
Physical Therapy Treatment Patient Details Name: Timothy Juarez MRN: [id number] DOB: [birth year] Today's Date: 11/03/2015    History of Present Illness Pt is admitted for L  AKA. Pt initially with L foot gangere->BKA  few months ago and is now s/p AKA secondary to wound complications.     PT Comments    Patient with improved Hgb levels today and is appropriate for ability to transfer from bed to chair. Patient insists on completing bed mobility and lateral scoot transfer independently. Patient is able to use UEs to complete sit to stand transfer multiple times without loss of balance with RW and is able to laterally rotate with use of RW to transfer from bed to chair. Patient demonstrating improved strength and ability to perform OOB mobility without strength assistance, more balance in this session. He would benefit from STR upon discharge to assist with use of RW for more long distance mobility given his AKA (or education on prosthetic pending future mobility decisions).   Follow Up Recommendations  SNF     Equipment Recommendations  Rolling walker with 5" wheels;Wheelchair (measurements PT)    Recommendations for Other Services       Precautions / Restrictions Precautions Precautions: Fall Restrictions Weight Bearing Restrictions: Yes [name] Weight Bearing: Non weight bearing    Mobility  Bed Mobility Overal bed mobility: Needs Assistance Bed Mobility: Supine to Sit     Supine to sit: Supervision     General bed mobility comments: Patient declines assistance, he is able to bring himself upright with use of UEs.   Transfers Overall transfer level: Needs assistance Equipment used: Rolling walker (2 wheeled) Transfers: Sit to/from Stand;Lateral/Scoot Transfers Sit to Stand: Supervision        Lateral/Scoot Transfers: Min guard General transfer comment: Patient transferred from bed to chair on his R side via scooting transfer. He completes with UEs and RLE with no  assistance from PT. Patient also educated on sit to stand transfer, which he can complete with cuing for hand placement and cga x 1 for balance by using UEs to push through recliner hand rails. Patient able to take several  lateral pivots but no hopping steps on RLE.   Ambulation/Gait                 Stairs            Wheelchair Mobility    Modified Rankin (Stroke Patients Only)       Balance Overall balance assessment: Needs assistance Sitting-balance support: No upper extremity supported Sitting balance-Leahy Scale: Good     Standing balance support: Bilateral upper extremity supported Standing balance-Leahy Scale: Fair                      Cognition Arousal/Alertness: Awake/alert Behavior During Therapy: WFL for tasks assessed/performed Overall Cognitive Status: Within Functional Limits for tasks assessed                      Exercises General Exercises - Lower Extremity Ankle Circles/Pumps: AROM;Right;10 reps Gluteal Sets: AROM;Both;10 reps Long Arc Quad: AROM;Right;10 reps Hip ABduction/ADduction: AROM;Both;5 reps Straight Leg Raises: AROM;Right;5 reps Other Exercises Other Exercises: Bilateral UE ROM and strength WNL - pt did had some crepitis in R shoulder during some ther ex assessment overhead  Other Exercises: Pt was Ind in supine<> sit at EOB Other Exercises: UB dressing pt ind with setup - no LOB  Other Exercises: LB dressing max A  General Comments General comments (skin integrity, edema, etc.): Stump with bandage in place, no active bleeding noted.       Pertinent Vitals/Pain Pain Assessment:  (Patient reports intermittent onset of cramping sensation in [name], around stump. Patient asked for pain meds after session, PT informed RN.) Pain Score: 6  Pain Location: L stump    Home Living Family/patient expects to be discharged to:: Assisted living             Home Equipment: Walker - 2 wheels;Cane - quad;Wheelchair -  manual Additional Comments: lives at [facility] in [place]    Prior Function Level of Independence: Independent with assistive device(s)      Comments: Pt did own dressing and bathing - did not use walker - transfer from and used w/c   PT Goals (current goals can now be found in the care plan section) Acute Rehab PT Goals Patient Stated Goal: to get pain better , stronger - bend down again to put sock and shoe on R PT Goal Formulation: With patient Time For Goal Achievement: 11/16/15 Potential to Achieve Goals: Good Progress towards PT goals: Progressing toward goals    Frequency  7X/week    PT Plan Current plan remains appropriate    Co-evaluation             End of Session Equipment Utilized During Treatment: Gait belt Activity Tolerance: Patient tolerated treatment well Patient left: in chair;with call bell/phone within reach;with chair alarm set     Time: [id number] PT Time Calculation (min) (ACUTE ONLY): 29 min  Charges:  $Therapeutic Exercise: 8-22 mins $Therapeutic Activity: 8-22 mins                    G Codes:      Kerman Passey, PT, DPT    11/03/2015, 4:50 PM

## 2015-11-03 NOTE — Progress Notes (Signed)
POD 2 s/p left AKA  Pain is adequately controled  AF VSS Dressing CD&I  ASO bilateral lower extremities s/p left AKA  Continue as ordered

## 2015-11-03 NOTE — Evaluation (Signed)
Occupational Therapy Evaluation Patient Details Name: Timothy Juarez MRN: [id number] DOB: 11/03/1950 Today's Date: 11/03/2015    History of Present Illness Pt is admitted for L  AKA. Pt initially with L foot gangere->BKA  few months ago and is now s/p AKA secondary to wound complications.    Clinical Impression   Pt present with good bed mobility and sitting balance on EOB- decrease balance for performing LB dressing and bathing as well as transfers. Increase pain still this date but willing to participate - pt did had UE ROM and strength WNL - but with crepitus in R shoulder during R shoulder AROM. Pt can benefit from OT services to increase independency in ADL' S and transfers in ADL's.     Follow Up Recommendations  SNF    Equipment Recommendations       Recommendations for Other Services PT consult     Precautions / Restrictions        Mobility Bed Mobility                  Transfers                      Balance                                            ADL                                               Vision     Perception     Praxis      Pertinent Vitals/Pain Pain Assessment: 0-10 Pain Score: 6  Pain Location: L stump     Hand Dominance Right   Extremity/Trunk Assessment             Communication Communication Communication: No difficulties   Cognition Arousal/Alertness: Awake/alert Behavior During Therapy: WFL for tasks assessed/performed Overall Cognitive Status: Within Functional Limits for tasks assessed                     General Comments       Exercises   Other Exercises Other Exercises: Bilateral UE ROM and strength WNL - pt did had some crepitis in R shoulder during some ther ex assessment overhead  Other Exercises: Pt was Ind in supine<> sit at EOB Other Exercises: UB dressing pt ind with setup - no LOB  Other Exercises: LB dressing max A    Shoulder  Instructions      Home Living Family/patient expects to be discharged to:: Assisted living                             Home Equipment: Walker - 2 wheels;Cane - quad;Wheelchair - manual   Additional Comments: lives at [facility] in [place]      Prior Functioning/Environment Level of Independence: Independent with assistive device(s)        Comments: Pt did own dressing and bathing - did not use walker - transfer from and used w/c    OT Diagnosis: Generalized weakness   OT Problem List: Decreased strength;Decreased activity tolerance;Impaired balance (sitting and/or standing);Decreased knowledge of use of DME or AE;Impaired UE functional use  OT Treatment/Interventions: Self-care/ADL training;Balance training;DME and/or AE instruction;Patient/family education    OT Goals(Current goals can be found in the care plan section) Acute Rehab OT Goals Patient Stated Goal: to get pain better , stronger - bend down again to put sock and shoe on R OT Goal Formulation: With patient Time For Goal Achievement: 11/17/15 Potential to Achieve Goals: Good  OT Frequency: Min 3X/week   Barriers to D/C:            Co-evaluation              End of Session    Activity Tolerance: Patient tolerated treatment well Patient left: in bed;with bed alarm set;with call bell/phone within reach   Time: 0930-1001 OT Time Calculation (min): 31 min Charges:  OT Evaluation $OT Eval Low Complexity: 1 Procedure G-Codes:    Rosalyn Gess 2015-11-18, 1:40 PM

## 2015-11-04 LAB — BASIC METABOLIC PANEL
Anion gap: 3 — ABNORMAL LOW (ref 5–15)
BUN: 19 mg/dL (ref 6–20)
CHLORIDE: 112 mmol/L — AB (ref 101–111)
CO2: 24 mmol/L (ref 22–32)
CREATININE: 0.91 mg/dL (ref 0.61–1.24)
Calcium: 7.6 mg/dL — ABNORMAL LOW (ref 8.9–10.3)
GFR calc Af Amer: >60 mL/min (ref >60)
GFR calc non Af Amer: >60 mL/min (ref >60)
GLUCOSE: 104 mg/dL — AB (ref 65–99)
POTASSIUM: 3.3 mmol/L — AB (ref 3.5–5.1)
Sodium: 139 mmol/L (ref 135–145)

## 2015-11-04 LAB — CBC
HEMATOCRIT: 21.1 % — AB (ref 40.0–52.0)
Hemoglobin: 6.9 g/dL — ABNORMAL LOW (ref 13.0–18.0)
MCH: 24.6 pg — ABNORMAL LOW (ref 26.0–34.0)
MCHC: 32.6 g/dL (ref 32.0–36.0)
MCV: 75.5 fL — AB (ref 80.0–100.0)
PLATELETS: 254 10*3/uL (ref 150–440)
RBC: 2.79 MIL/uL — ABNORMAL LOW (ref 4.40–5.90)
RDW: 19.2 % — AB (ref 11.5–14.5)
WBC: 16.9 10*3/uL — AB (ref 3.8–10.6)

## 2015-11-04 LAB — MAGNESIUM: Magnesium: 1.9 mg/dL (ref 1.7–2.4)

## 2015-11-04 NOTE — Progress Notes (Signed)
PT Cancellation Note  Patient Details Name: Timothy Juarez MRN: [id number] DOB: 1950-08-31   Cancelled Treatment:    Reason Eval/Treat Not Completed: Medical issues which prohibited therapy. Upon thorough chart review, it was determined that patient is currently contraindicated for physical therapy due to hemoglobin levels. Will check back later to determine if treatment appropriate.   Dorice Lamas, PT, DPT 11/04/2015, 8:25 AM

## 2015-11-04 NOTE — Progress Notes (Signed)
POD 3 s/p left AKA  Pain is adequately controled he did not like his lunch but refused when I said we could get something else for him  AF VSS Dressing CD&I  ASO bilateral lower extremities s/p left AKA  Continue as ordered

## 2015-11-05 LAB — BASIC METABOLIC PANEL
ANION GAP: 3 — AB (ref 5–15)
BUN: 16 mg/dL (ref 6–20)
CALCIUM: 7.9 mg/dL — AB (ref 8.9–10.3)
CO2: 26 mmol/L (ref 22–32)
CREATININE: 0.93 mg/dL (ref 0.61–1.24)
Chloride: 108 mmol/L (ref 101–111)
GFR calc Af Amer: >60 mL/min (ref >60)
GFR calc non Af Amer: >60 mL/min (ref >60)
GLUCOSE: 103 mg/dL — AB (ref 65–99)
Potassium: 3.2 mmol/L — ABNORMAL LOW (ref 3.5–5.1)
Sodium: 137 mmol/L (ref 135–145)

## 2015-11-05 LAB — CBC
HCT: 23.1 % — ABNORMAL LOW (ref 40.0–52.0)
HEMOGLOBIN: 7.4 g/dL — AB (ref 13.0–18.0)
MCH: 24 pg — AB (ref 26.0–34.0)
MCHC: 32 g/dL (ref 32.0–36.0)
MCV: 75.1 fL — ABNORMAL LOW (ref 80.0–100.0)
Platelets: 339 10*3/uL (ref 150–440)
RBC: 3.07 MIL/uL — ABNORMAL LOW (ref 4.40–5.90)
RDW: 20 % — ABNORMAL HIGH (ref 11.5–14.5)
WBC: 19 10*3/uL — ABNORMAL HIGH (ref 3.8–10.6)

## 2015-11-05 LAB — SURGICAL PATHOLOGY

## 2015-11-05 LAB — MAGNESIUM: MAGNESIUM: 2 mg/dL (ref 1.7–2.4)

## 2015-11-05 NOTE — Clinical Documentation Improvement (Signed)
Vascular Surgery  Can the diagnosis of CHF be further specified?    Acuity - Acute, Chronic, Acute on Chronic   Type - Systolic, Diastolic, Systolic and Diastolic  Other  Clinically Undetermined   Document any associated diagnoses/conditions Please update your documentation within the medical record to reflect your response to this query. Thank you.  Supporting Information:(As per notes) " Congestive Heart Failure"  Please exercise your independent, professional judgment when responding. A specific answer is not anticipated or expected.  Thank You, Alessandra Grout, RN, BSN, CCDS,Clinical Documentation Specialist:  [phone]  [phone]=[facility] [place]- Health Information Management

## 2015-11-05 NOTE — Progress Notes (Signed)
Physical Therapy Treatment Patient Details Name: Timothy Juarez MRN: [id number] DOB: Apr 22, 1951 Today's Date: 11/05/2015    History of Present Illness Pt is admitted for L  AKA. Pt initially with L foot gangere->BKA  few months ago and is now s/p AKA secondary to wound complications.     PT Comments    Pt agreeable to PT, but refused out of bed. Pt notes cramping pain in Left lower extremity with movement. Pt encouraged to move within more comfortable ranges to prevent cramping pain. Performs bed exercises well with some effort and discomfort, but improved control over cramping. Encouraged continued work on exercises throughout the day as tolerated. Continue PT and encourage up in chair tomorrow. Progress range of motion, strength to improve functional mobility.   Follow Up Recommendations  SNF     Equipment Recommendations  Rolling walker with 5" wheels;Wheelchair (measurements PT)    Recommendations for Other Services       Precautions / Restrictions Precautions Precautions: Fall Restrictions Weight Bearing Restrictions: Yes [name] Weight Bearing: Non weight bearing    Mobility  Bed Mobility               General bed mobility comments: Modified independence for readjusting upward in bed.   Transfers                 General transfer comment: Refused out of bed  Ambulation/Gait                 Stairs            Wheelchair Mobility    Modified Rankin (Stroke Patients Only)       Balance                                    Cognition Arousal/Alertness: Awake/alert Behavior During Therapy: WFL for tasks assessed/performed Overall Cognitive Status: Within Functional Limits for tasks assessed                      Exercises General Exercises - Lower Extremity Ankle Circles/Pumps: AROM;Right;20 reps;Supine Quad Sets: Strengthening;Right;20 reps;Supine Gluteal Sets: Strengthening;Both;20 reps;Supine Short Arc Quad:  AROM;Right;20 reps;Supine Heel Slides: AROM;Right;20 reps;Supine (light resisted extension) Hip ABduction/ADduction: AROM;Both;20 reps;Supine (limited range on L) Straight Leg Raises: AROM;Both;20 reps;Supine (limited range on L) Other Exercises Other Exercises: L isometric hip extension 20x  Other Exercises: adductor squeeze 20x; pillow    General Comments General comments (skin integrity, edema, etc.): [name] bandage intact      Pertinent Vitals/Pain Pain Assessment: 0-10 Pain Score: 10-Worst pain ever (with cramping in L with more moving/resist on R ) Pain Location: [name] Pain Descriptors / Indicators: Cramping Pain Intervention(s): Limited activity within patient's tolerance    Home Living                      Prior Function            PT Goals (current goals can now be found in the care plan section) Progress towards PT goals: Progressing toward goals (slowly)    Frequency  7X/week    PT Plan Current plan remains appropriate    Co-evaluation             End of Session   Activity Tolerance: Patient limited by fatigue;Patient limited by pain Patient left: in bed;with bed alarm set;with call bell/phone within reach     Time:  [id number] PT Time Calculation (min) (ACUTE ONLY): 20 min  Charges:  $Therapeutic Exercise: 8-22 mins                    G Codes:      Charlaine Dalton, PTA 11/05/2015, 3:35 PM

## 2015-11-05 NOTE — Progress Notes (Signed)
Patient with fair pain control. Some foul-smelling drainage was on his dressing which was removed today. The incision itself does appear clean and dry and intact, but the purulent odor is worrisome for some underlying infection. We'll leave dressing off for now. Recheck CBC tomorrow and if the wound dries up he may be applicable to go back to his skilled nursing facility tomorrow.

## 2015-11-05 NOTE — Progress Notes (Signed)
Patient refused to get out of the bed to sit on the chair.

## 2015-11-06 LAB — CBC
HCT: 24.2 % — ABNORMAL LOW (ref 40.0–52.0)
HEMOGLOBIN: 7.8 g/dL — AB (ref 13.0–18.0)
MCH: 24.7 pg — AB (ref 26.0–34.0)
MCHC: 32.4 g/dL (ref 32.0–36.0)
MCV: 76.1 fL — AB (ref 80.0–100.0)
Platelets: 405 10*3/uL (ref 150–440)
RBC: 3.17 MIL/uL — AB (ref 4.40–5.90)
RDW: 20.2 % — ABNORMAL HIGH (ref 11.5–14.5)
WBC: 21.3 10*3/uL — ABNORMAL HIGH (ref 3.8–10.6)

## 2015-11-06 MED ORDER — PIPERACILLIN-TAZOBACTAM 3.375 G IVPB
3.3750 g | Freq: Four times a day (QID) | INTRAVENOUS | Status: DC
  Filled 2015-11-06 (×3): qty 50

## 2015-11-06 MED ORDER — PIPERACILLIN-TAZOBACTAM 3.375 G IVPB
3.3750 g | Freq: Three times a day (TID) | INTRAVENOUS | Status: DC
  Administered 2015-11-06 – 2015-11-09 (×9): 3.375 g via INTRAVENOUS
  Filled 2015-11-06 (×12): qty 50

## 2015-11-06 NOTE — Progress Notes (Signed)
[facility]  Daily Progress Note   Subjective  - 5 Days Post-Op  Moderate pain, control is fair. No fever overnight but WBC is up  Objective Filed Vitals:   11/05/15 1255 11/05/15 1515 11/05/15 2049 11/06/15 0542  BP: 102/51  122/63 116/58  Pulse: 85 85 101 93  Temp: 98.9 F (37.2 C)  98.6 F (37 C) 99.6 F (37.6 C)  TempSrc: Oral  Oral Oral  Resp: 17  17 16   Height:      Weight:      SpO2: 100% 99% 99% 99%    Intake/Output Summary (Last 24 hours) at 11/06/15 0846 Last data filed at 11/06/15 0542  Gross per 24 hour  Intake    720 ml  Output   1950 ml  Net  -1230 ml    PULM  CTAB CV  RRR VASC  Incision with mild foul odor, C/D/I  Laboratory CBC    Component Value Date/Time   WBC 21.3* 11/06/2015 0621   WBC 11.1* 12/01/2013 1317   HGB 7.8* 11/06/2015 0621   HGB 14.3 12/01/2013 1317   HCT 24.2* 11/06/2015 0621   HCT 43.9 12/01/2013 1317   PLT 405 11/06/2015 0621   PLT 250 12/01/2013 1317    BMET    Component Value Date/Time   NA 137 11/05/2015 0602   NA 136 12/01/2013 1317   K 3.2* 11/05/2015 0602   K 3.9 12/01/2013 1317   CL 108 11/05/2015 0602   CL 100 12/01/2013 1317   CO2 26 11/05/2015 0602   CO2 33* 12/01/2013 1317   GLUCOSE 103* 11/05/2015 0602   GLUCOSE 91 12/01/2013 1317   BUN 16 11/05/2015 0602   BUN 24* 09/25/2014 0846   CREATININE 0.93 11/05/2015 0602   CREATININE 1.29 09/25/2014 0846   CALCIUM 7.9* 11/05/2015 0602   CALCIUM 9.4 12/01/2013 1317   GFRNONAA >60 11/05/2015 0602   GFRNONAA 60* 09/25/2014 0846   GFRNONAA >60 03/16/2014 0750   GFRAA >60 11/05/2015 0602   GFRAA >60 09/25/2014 0846   GFRAA >60 03/16/2014 0750    Assessment/Planning: POD #5 s/p left AKA   WBC going up.  Foul smell yesterday on dressing, better today but with increasing WBC worry about infection in the stump  Will start IV Abx and recheck cbc tomorrow.    Hold discharge for now    DEW,JASON  11/06/2015, 8:46  AM

## 2015-11-06 NOTE — Consult Note (Signed)
Pharmacy Antibiotic Note  Timothy Juarez is a 65 y.o. male admitted on 11/01/2015 with wound infection.  Pharmacy has been consulted for zosyn dosing.  Plan: Zosyn 3.375g IV q8h (4 hour infusion).  Height: 6' (182.9 cm) Weight: 190 lb (86.183 kg) IBW/kg (Calculated) : 77.6  Temp (24hrs), Avg:99 F (37.2 C), Min:98.6 F (37 C), Max:99.6 F (37.6 C)   Recent Labs Lab 11/02/15 0436 11/02/15 1932 11/03/15 0657 11/04/15 0626 11/05/15 0602 11/06/15 0621  WBC 15.9* 17.2* 18.2* 16.9* 19.0* 21.3*  CREATININE 0.99  --  0.84 0.91 0.93  --     Estimated Creatinine Clearance: 86.9 mL/min (by C-G formula based on Cr of 0.93).    No Known Allergies  Antimicrobials this admission: zosyn 3/21 >>  Doxycycline 3/16>>  Dose adjustments this admission:   Microbiology results:   Thank you for allowing pharmacy to be a part of this patient's care.  Ramond Dial 11/06/2015 11:16 AM

## 2015-11-06 NOTE — Progress Notes (Signed)
Physical Therapy Treatment Patient Details Name: Timothy Juarez MRN: [id number] DOB: Mar 19, 1951 Today's Date: 11/06/2015    History of Present Illness Pt is admitted for L  AKA. Pt initially with L foot gangere->BKA  few months ago and is now s/p AKA secondary to wound complications.     PT Comments    Pt currently on IV antibiotics for suspected infection in Left lower extremity. Pt continues with cramping pain up to 8/10 with movement. Pt refuses out of bed again today due to pain and general malaise. Pt does participate well in supine bed exercises without increased pain demonstrating improved range in Left lower extremity. Added isometric ham curl to Right lower extremity today. Continue PT to progress strength, range of motion and up in bed out of bed activities to improve functional mobility.   Follow Up Recommendations  SNF     Equipment Recommendations  Rolling walker with 5" wheels;Wheelchair (measurements PT)    Recommendations for Other Services       Precautions / Restrictions Precautions Precautions: Fall Restrictions Weight Bearing Restrictions: Yes [name] Weight Bearing: Non weight bearing    Mobility  Bed Mobility               General bed mobility comments: Not tested; refused out of bed   Transfers                    Ambulation/Gait                 Stairs            Wheelchair Mobility    Modified Rankin (Stroke Patients Only)       Balance                                    Cognition Arousal/Alertness: Awake/alert Behavior During Therapy: WFL for tasks assessed/performed Overall Cognitive Status: Within Functional Limits for tasks assessed                      Exercises General Exercises - Lower Extremity Ankle Circles/Pumps: AROM;Right;20 reps;Supine Quad Sets: Strengthening;Right;20 reps;Supine Gluteal Sets: Strengthening;Both;20 reps;Supine Short Arc Quad: AROM;Right;20  reps;Supine Heel Slides: AROM;Right;20 reps;Supine Hip ABduction/ADduction: AROM;Both;20 reps;Supine Straight Leg Raises: AROM;Both;20 reps;Supine Other Exercises Other Exercises: L isometric hip extension 20x  Other Exercises: adductor squeeze 20x; pillow Other Exercises: R ham curl isometric 20x 5 sec hold    General Comments General comments (skin integrity, edema, etc.): [name] bandage with increased drainage noted under [name] on pad      Pertinent Vitals/Pain Pain Assessment: 0-10 Pain Score: 8  Pain Location: [name] (With cramping pain) Pain Descriptors / Indicators: Cramping Pain Intervention(s): Limited activity within patient's tolerance;Premedicated before session    Home Living                      Prior Function            PT Goals (current goals can now be found in the care plan section) Progress towards PT goals: Progressing toward goals (slowly)    Frequency  7X/week    PT Plan Current plan remains appropriate    Co-evaluation             End of Session   Activity Tolerance: No increased pain;Patient limited by pain Patient left: in bed;with bed alarm set;with call bell/phone within reach  Time: [id number] PT Time Calculation (min) (ACUTE ONLY): 24 min  Charges:  $Therapeutic Exercise: 23-37 mins                    G Codes:      Charlaine Dalton, PTA 11/06/2015, 1:31 PM

## 2015-11-07 ENCOUNTER — Inpatient Hospital Stay: Payer: Medicaid Other

## 2015-11-07 LAB — URINALYSIS COMPLETE WITH MICROSCOPIC (ARMC ONLY)
BACTERIA UA: NONE SEEN
Bilirubin Urine: NEGATIVE
Glucose, UA: NEGATIVE mg/dL
Leukocytes, UA: NEGATIVE
Nitrite: NEGATIVE
PH: 5 (ref 5.0–8.0)
PROTEIN: 30 mg/dL — AB
SQUAMOUS EPITHELIAL / LPF: NONE SEEN
Specific Gravity, Urine: 1.024 (ref 1.005–1.030)

## 2015-11-07 LAB — CBC
HEMATOCRIT: 24 % — AB (ref 40.0–52.0)
HEMOGLOBIN: 7.8 g/dL — AB (ref 13.0–18.0)
MCH: 24.4 pg — AB (ref 26.0–34.0)
MCHC: 32.7 g/dL (ref 32.0–36.0)
MCV: 74.5 fL — AB (ref 80.0–100.0)
Platelets: 450 10*3/uL — ABNORMAL HIGH (ref 150–440)
RBC: 3.22 MIL/uL — ABNORMAL LOW (ref 4.40–5.90)
RDW: 20.9 % — ABNORMAL HIGH (ref 11.5–14.5)
WBC: 24.8 10*3/uL — ABNORMAL HIGH (ref 3.8–10.6)

## 2015-11-07 NOTE — Progress Notes (Signed)
Occupational Therapy Treatment Patient Details Name: Timothy Juarez MRN: [id number] DOB: [birth year] Today's Date: 11/07/2015    History of present illness Pt is admitted for L  AKA. Pt initially with L foot gangere->BKA  few months ago and is now s/p AKA secondary to wound complications.    OT comments  Pt. Was able to demonstrate proper use of A/E use of for the retrieval of items from the bedside table, and manipulate reacher for arranging bed covers, and linens. Pt. continues to benefit from skilled OT services to work on improving reacher use for LE dressing skills.   Follow Up Recommendations  SNF    Equipment Recommendations       Recommendations for Other Services      Precautions / Restrictions Precautions Precautions: Fall Restrictions Weight Bearing Restrictions: Yes [name] Weight Bearing: Non weight bearing       Mobility Bed Mobility Overal bed mobility: Modified Independent Bed Mobility: Supine to Sit;Sit to Supine     Supine to sit: Modified independent (Device/Increase time) Sit to supine: Modified independent (Device/Increase time)   General bed mobility comments: Use of rails; increased time  Transfers Overall transfer level: Needs assistance Equipment used: Rolling walker (2 wheeled) Transfers: Sit to/from Stand Sit to Stand: Min assist;From elevated surface         General transfer comment: Bed slightly elevated, cues for hands    Balance   Sitting-balance support: Single extremity supported;Bilateral upper extremity supported Sitting balance-Leahy Scale: Good     Standing balance support: Bilateral upper extremity supported Standing balance-Leahy Scale: Fair                     ADL                                         General ADL Comments: Pt. education was provided about the general use of A/E for the retrieval of items and LE dressing. Pt. was able to demonstrate proper A/E use for tasks such as managing  covers, and retrieving items from the bedside table.       Vision                     Perception     Praxis      Cognition   Behavior During Therapy: [facility] for tasks assessed/performed Overall Cognitive Status: Within Functional Limits for tasks assessed                       Extremity/Trunk Assessment               Exercises General Exercises - Lower Extremity Long Arc Quad: Strengthening;Right;20 reps;Seated (2 sets of 10) Hip ABduction/ADduction: Strengthening;20 reps;Seated;Right (2 sets of 10; L AROM) Hip Flexion/Marching: Strengthening;Right;20 reps;Seated (2 sets of 10) Toe Raises: AROM;Right;20 reps;Seated Heel Raises: AROM;Right;20 reps;Seated Other Exercises Other Exercises: Adductor squeeze 20x Other Exercises: R resistd ham curls 20x    Shoulder Instructions       General Comments      Pertinent Vitals/ Pain       Pain Assessment: No/denies pain Pain Score: 4  Pain Location: [name] Pain Intervention(s): Monitored during session  Home Living  Prior Functioning/Environment              Frequency Min 1X/week     Progress Toward Goals  OT Goals(current goals can now be found in the care plan section)     Acute Rehab OT Goals Patient Stated Goal: to get pain better , stronger - bend down again to put sock and shoe on R OT Goal Formulation: With patient Time For Goal Achievement: 11/17/15 Potential to Achieve Goals: Good  Plan Discharge plan remains appropriate    Co-evaluation                 End of Session     Activity Tolerance Patient tolerated treatment well   Patient Left  Pt. Left with the bed alarm in place.   Nurse Communication          Time: [id number] OT Time Calculation (min): 13 min  Charges: OT General Charges $OT Visit: 1 Procedure OT Treatments $Self Care/Home Management : 8-22 mins   Harrel Carina, MS, OTR/L  Harrel Carina 11/07/2015, 11:25 AM

## 2015-11-07 NOTE — Progress Notes (Signed)
[facility]  Daily Progress Note   Subjective  - 6 Days Post-Op  White blood cell count higher today. Had a temperature of 100.5 overnight. He says that his stump is actually feeling better and his pain is going down. He does not have specific complaints  Objective Filed Vitals:   11/06/15 2044 11/07/15 0446 11/07/15 1027 11/07/15 1328  BP: 105/57 117/51  101/56  Pulse: 107 91 122 101  Temp: 100.5 F (38.1 C) 99.4 F (37.4 C)  99.5 F (37.5 C)  TempSrc: Oral Oral  Oral  Resp: 16 18  17   Height:      Weight:      SpO2: 100% 98% 98% 99%    Intake/Output Summary (Last 24 hours) at 11/07/15 1628 Last data filed at 11/07/15 1326  Gross per 24 hour  Intake    580 ml  Output   1300 ml  Net   -720 ml    PULM  CTAB CV  RRR VASC  Stump is clean, dry, and intact without any foul odor, erythema, or drainage at this point  Laboratory CBC    Component Value Date/Time   WBC 24.8* 11/07/2015 0937   WBC 11.1* 12/01/2013 1317   HGB 7.8* 11/07/2015 0937   HGB 14.3 12/01/2013 1317   HCT 24.0* 11/07/2015 0937   HCT 43.9 12/01/2013 1317   PLT 450* 11/07/2015 0937   PLT 250 12/01/2013 1317    BMET    Component Value Date/Time   NA 137 11/05/2015 0602   NA 136 12/01/2013 1317   K 3.2* 11/05/2015 0602   K 3.9 12/01/2013 1317   CL 108 11/05/2015 0602   CL 100 12/01/2013 1317   CO2 26 11/05/2015 0602   CO2 33* 12/01/2013 1317   GLUCOSE 103* 11/05/2015 0602   GLUCOSE 91 12/01/2013 1317   BUN 16 11/05/2015 0602   BUN 24* 09/25/2014 0846   CREATININE 0.93 11/05/2015 0602   CREATININE 1.29 09/25/2014 0846   CALCIUM 7.9* 11/05/2015 0602   CALCIUM 9.4 12/01/2013 1317   GFRNONAA >60 11/05/2015 0602   GFRNONAA 60* 09/25/2014 0846   GFRNONAA >60 03/16/2014 0750   GFRAA >60 11/05/2015 0602   GFRAA >60 09/25/2014 0846   GFRAA >60 03/16/2014 0750    Assessment/Planning: POD #6 s/p left AKA   White blood cell count continues to go up. Etiology is not  clear as the stump itself actually looks pretty good with no drainage, erythema, or foul odor at this point.  I'm going to get a full set of cultures as well as a chest x-ray. I am also worried about C. difficile given his multiple hospital admissions and previous antibiotics.  Not stable for discharge this point with increasing white blood cell count and now with low-grade fever.  Recheck CBC tomorrow. Continue IV antibiotics empirically at this point.    DEW,JASON  11/07/2015, 4:28 PM

## 2015-11-07 NOTE — Progress Notes (Signed)
Initial Nutrition Assessment     INTERVENTION:  Meals and snacks: Cater to pt preferences, discussed options, menus with pt.  Encouraged pt to call kitchen to place food order so kitchen would send up foods he likes. Offered to have kitchen chop foods and pt refused   NUTRITION DIAGNOSIS:    (none at this time) related to   as evidenced by  .    GOAL:   Patient will meet greater than or equal to 90% of their needs    MONITOR:   PO intake  REASON FOR ASSESSMENT:   LOS    ASSESSMENT:      Pt s/p left AKA with possible stump infection at this time, elevated WBC and started on antibiotics  Past Medical History  Diagnosis Date  . Hypertension   . Peripheral vascular disease ([place])   . CHF (congestive heart failure) ([place])   . COPD (chronic obstructive pulmonary disease) ([place])   . Hypokalemia   . Insomnia   . Pressure ulcer   . ARF (acute respiratory failure) ([place])     H/O  . Muscle contracture     MUSCLE SPASMS    Current Nutrition: eating on average 54% of meals per I and O sheet.  Pt does not really like hospital foods.    Food/Nutrition-Related History: normal intake prior to admission   Scheduled Medications:  . acetaminophen  650 mg Oral QHS  . apixaban  2.5 mg Oral BID  . aspirin EC  81 mg Oral Daily  . baclofen  5 mg Oral TID  . docusate sodium  100 mg Oral Daily  . doxycycline  100 mg Oral Q12H  . famotidine  20 mg Oral BID  . furosemide  40 mg Oral BID  . gabapentin  100 mg Oral QHS  . metoprolol tartrate  12.5 mg Oral BID  . mometasone-formoterol  2 puff Inhalation BID  . piperacillin-tazobactam (ZOSYN)  IV  3.375 g Intravenous 3 times per day  . potassium chloride SA  20 mEq Oral Daily  . tiotropium  18 mcg Inhalation Daily      Electrolyte/Renal Profile and Glucose Profile:   Recent Labs Lab 11/03/15 0657 11/04/15 0626 11/05/15 0602  NA 139 139 137  K 3.5 3.3* 3.2*  CL 114* 112* 108  CO2 22 24 26   BUN 25* 19 16  CREATININE 0.84  0.91 0.93  CALCIUM 7.7* 7.6* 7.9*  MG 1.8 1.9 2.0  GLUCOSE 120* 104* 103*    Gastrointestinal Profile: Last BM: 3/22    Weight Change: stable wt per pt    Diet Order:  Diet regular Room service appropriate?: Yes; Fluid consistency:: Thin  Skin:   reviewed   Height:   Ht Readings from Last 1 Encounters:  11/01/15 6' (1.829 m)    Weight:   Wt Readings from Last 1 Encounters:  11/01/15 190 lb (86.183 kg)    Ideal Body Weight:     BMI:  Body mass index is 25.76 kg/(m^2).   EDUCATION NEEDS:   No education needs identified at this time  LOW Care Level  Astaria Nanez B. Zenia Resides, [place], [place] (pager) Weekend/On-Call pager [phone])

## 2015-11-07 NOTE — Discharge Instructions (Addendum)
You may shower.

## 2015-11-07 NOTE — Progress Notes (Addendum)
Physical Therapy Treatment Patient Details Name: Timothy Juarez MRN: [id number] DOB: [birth year] Today's Date: 11/07/2015    History of Present Illness Pt is admitted for L  AKA. Pt initially with L foot gangere->BKA  few months ago and is now s/p AKA secondary to wound complications.     PT Comments    Pt reluctant to participate with therapy due to general malaise and weak feeling. Pt notes being up on commode today for approximately 30 minutes this morn. With gentle encouragement, agreeable. Pt denies pain at rest, but pain does increase to a 7 with exercise/stand. Pt sits edge of bed for exercises and tolerates resistive exercises on right. Pt denies cramping pain in left lower extremity that had been issue the past several days. Pt stands x 1 for 22 seconds with rolling walker. Min A to stand from elevated surface; Min guard during static stand. Pt refused up in chair and wished back to bed. Heart rate between 119 and 122 beats per minute throughout session; O2 saturation 92%.  Continue PT progressing strength and all functional mobility. Continue to encourage up in chair.    Follow Up Recommendations  SNF     Equipment Recommendations  Rolling walker with 5" wheels;Wheelchair (measurements PT)    Recommendations for Other Services       Precautions / Restrictions Precautions Precautions: Fall Restrictions Weight Bearing Restrictions: Yes [name] Weight Bearing: Non weight bearing    Mobility  Bed Mobility Overal bed mobility: Modified Independent Bed Mobility: Supine to Sit;Sit to Supine     Supine to sit: Modified independent (Device/Increase time) Sit to supine: Modified independent (Device/Increase time)   General bed mobility comments: Use of rails; increased time  Transfers Overall transfer level: Needs assistance Equipment used: Rolling walker (2 wheeled) Transfers: Sit to/from Stand Sit to Stand: Min assist;From elevated surface         General transfer  comment: Bed slightly elevated, cues for hands  Ambulation/Gait                 Stairs            Wheelchair Mobility    Modified Rankin (Stroke Patients Only)       Balance   Sitting-balance support: Single extremity supported;Bilateral upper extremity supported Sitting balance-Leahy Scale: Good     Standing balance support: Bilateral upper extremity supported Standing balance-Leahy Scale: Fair                      Cognition Arousal/Alertness: Awake/alert Behavior During Therapy: WFL for tasks assessed/performed Overall Cognitive Status: Within Functional Limits for tasks assessed                      Exercises General Exercises - Lower Extremity Long Arc Quad: Strengthening;Right;20 reps;Seated (2 sets of 10) Hip ABduction/ADduction: Strengthening;20 reps;Seated;Right (2 sets of 10; L AROM) Hip Flexion/Marching: Strengthening;Right;20 reps;Seated (2 sets of 10) Toe Raises: AROM;Right;20 reps;Seated Heel Raises: AROM;Right;20 reps;Seated Other Exercises Other Exercises: Adductor squeeze 20x Other Exercises: R resistd ham curls 20x     General Comments General comments (skin integrity, edema, etc.): incision dry/clean; intact      Pertinent Vitals/Pain Pain Assessment: No/denies pain Pain Score: 4  Pain Location: [name] Pain Intervention(s): Monitored during session    Home Living                      Prior Function  PT Goals (current goals can now be found in the care plan section) Progress towards PT goals: Progressing toward goals    Frequency  7X/week    PT Plan Current plan remains appropriate    Co-evaluation             End of Session   Activity Tolerance: Patient limited by fatigue (Pain increased to 7 with exercise/stand) Patient left: in bed;with call bell/phone within reach;with bed alarm set     Time: [id number] PT Time Calculation (min) (ACUTE ONLY): 29 min  Charges:   $Therapeutic Exercise: 8-22 mins $Therapeutic Activity: 8-22 mins                    G Codes:      Charlaine Dalton, PTA 11/07/2015, 11:24 AM

## 2015-11-08 LAB — CBC
HCT: 22.8 % — ABNORMAL LOW (ref 40.0–52.0)
HEMOGLOBIN: 7.4 g/dL — AB (ref 13.0–18.0)
MCH: 24.5 pg — AB (ref 26.0–34.0)
MCHC: 32.4 g/dL (ref 32.0–36.0)
MCV: 75.5 fL — ABNORMAL LOW (ref 80.0–100.0)
PLATELETS: 435 10*3/uL (ref 150–440)
RBC: 3.02 MIL/uL — ABNORMAL LOW (ref 4.40–5.90)
RDW: 21.4 % — AB (ref 11.5–14.5)
WBC: 20.5 10*3/uL — ABNORMAL HIGH (ref 3.8–10.6)

## 2015-11-08 LAB — C DIFFICILE QUICK SCREEN W PCR REFLEX
C DIFFICILE (CDIFF) INTERP: NEGATIVE
C DIFFICILE (CDIFF) TOXIN: NEGATIVE
C Diff antigen: NEGATIVE

## 2015-11-08 NOTE — Progress Notes (Signed)
[facility]  Daily Progress Note   Subjective  - 7 Days Post-Op  Feeling better.  Pain control adequate.  No events overnight.  No fever last 24 hours  Objective Filed Vitals:   11/07/15 1328 11/07/15 2022 11/08/15 0544 11/08/15 1154  BP: 101/56 128/57 110/51 101/49  Pulse: 101 102 98 98  Temp: 99.5 F (37.5 C) 98.9 F (37.2 C) 99.6 F (37.6 C) 98.6 F (37 C)  TempSrc: Oral Oral Oral Oral  Resp: 17 20  22   Height:      Weight:      SpO2: 99% 100% 97% 96%    Intake/Output Summary (Last 24 hours) at 11/08/15 1553 Last data filed at 11/08/15 1037  Gross per 24 hour  Intake    250 ml  Output    500 ml  Net   -250 ml    PULM  CTAB CV  RRR VASC  Stump is C/D/I  Laboratory CBC    Component Value Date/Time   WBC 20.5* 11/08/2015 0514   WBC 11.1* 12/01/2013 1317   HGB 7.4* 11/08/2015 0514   HGB 14.3 12/01/2013 1317   HCT 22.8* 11/08/2015 0514   HCT 43.9 12/01/2013 1317   PLT 435 11/08/2015 0514   PLT 250 12/01/2013 1317    BMET    Component Value Date/Time   NA 137 11/05/2015 0602   NA 136 12/01/2013 1317   K 3.2* 11/05/2015 0602   K 3.9 12/01/2013 1317   CL 108 11/05/2015 0602   CL 100 12/01/2013 1317   CO2 26 11/05/2015 0602   CO2 33* 12/01/2013 1317   GLUCOSE 103* 11/05/2015 0602   GLUCOSE 91 12/01/2013 1317   BUN 16 11/05/2015 0602   BUN 24* 09/25/2014 0846   CREATININE 0.93 11/05/2015 0602   CREATININE 1.29 09/25/2014 0846   CALCIUM 7.9* 11/05/2015 0602   CALCIUM 9.4 12/01/2013 1317   GFRNONAA >60 11/05/2015 0602   GFRNONAA 60* 09/25/2014 0846   GFRNONAA >60 03/16/2014 0750   GFRAA >60 11/05/2015 0602   GFRAA >60 09/25/2014 0846   GFRAA >60 03/16/2014 0750    Assessment/Planning: POD #7 s/p left AKA   Plan to recheck CBC tomorrow and if WBC trending down, discharge  Continue IV Abx for now     DEW,JASON  11/08/2015, 3:53 PM

## 2015-11-08 NOTE — Clinical Social Work Note (Signed)
Update provided to Timothy Juarez at [facility]. Patient may be nearing ready for discharge and wbc's are trending down. Shela Leff MSW,LCSW [phone]

## 2015-11-08 NOTE — Progress Notes (Signed)
Physical Therapy Treatment Patient Details Name: Timothy Juarez MRN: [id number] DOB: [birth year] Today's Date: 11/08/2015    History of Present Illness Pt is admitted for L  AKA. Pt initially with L foot gangere->BKA  few months ago and is now s/p AKA secondary to wound complications.     PT Comments    Pt now on enteric precautions. Pt begrudgingly agreeable to PT. Performs supine/seated exercises well without complains of pain in Left lower extremity. Pt does, however, note pain when gown brushes over residual limb. Pt reluctant to sit in chair and questions why therapist "pushes" this each day. Pt education provided on benefits to overall strength/well being by sitting up throughout the day. Pt encouraged to participate in stand/ambulation to chair versus sliding over and pt initially wishes, as pt did demonstrate ability to stand last session. Pt able to demonstrate several hops to chair with rolling walker and Min guard assist. Once situated in chair comfortably, pt questions how long he must sit and verbalizes disapproval of being "made" to sit in chair. Nursing assistant notified of pt status. Continue PT to progress strength and out of bed activities to improve all functional mobility.   Follow Up Recommendations  SNF     Equipment Recommendations  Rolling walker with 5" wheels;Wheelchair (measurements PT)    Recommendations for Other Services       Precautions / Restrictions Restrictions Weight Bearing Restrictions: Yes [name] Weight Bearing: Non weight bearing    Mobility  Bed Mobility Overal bed mobility: Modified Independent Bed Mobility: Supine to Sit     Supine to sit: Modified independent (Device/Increase time)     General bed mobility comments: Use of rails; increased time  Transfers Overall transfer level: Needs assistance Equipment used: Rolling walker (2 wheeled) Transfers: Sit to/from Stand Sit to Stand: Min assist;From elevated surface          General transfer comment: Slow to rise/attain full upright posture  Ambulation/Gait Ambulation/Gait assistance: Min guard Ambulation Distance (Feet): 3 Feet Assistive device: Rolling walker (2 wheeled) Gait Pattern/deviations:  (several hops RLE) Gait velocity: decreased Gait velocity interpretation: <1.8 ft/sec, indicative of risk for recurrent falls General Gait Details: Takes several hops to the R bed to chair   Stairs            Wheelchair Mobility    Modified Rankin (Stroke Patients Only)       Balance Overall balance assessment: Needs assistance Sitting-balance support: No upper extremity supported (R foot on floor) Sitting balance-Leahy Scale: Good     Standing balance support: Bilateral upper extremity supported Standing balance-Leahy Scale: Fair                      Cognition Arousal/Alertness: Awake/alert Behavior During Therapy: WFL for tasks assessed/performed Overall Cognitive Status: Within Functional Limits for tasks assessed                      Exercises General Exercises - Lower Extremity Ankle Circles/Pumps: AROM;Right;20 reps;Supine Quad Sets: Strengthening;Right;20 reps;Supine Gluteal Sets: Strengthening;Both;20 reps;Supine Long Arc Quad: Strengthening;Right;20 reps;Seated Hip ABduction/ADduction: AROM;Right;20 reps;Supine Straight Leg Raises: AROM;Both;20 reps;Supine Hip Flexion/Marching: Strengthening;Right;20 reps;Seated Other Exercises Other Exercises: R resistd ham curls 20x     General Comments General comments (skin integrity, edema, etc.): incision dry, clean, intact; sensitive to gown touching      Pertinent Vitals/Pain Pain Assessment: 0-10 Pain Score: 5  (with movement/touch; 0/10 at rest) Pain Location: [name]    Home Living  Prior Function            PT Goals (current goals can now be found in the care plan section) Progress towards PT goals: Progressing toward goals  (slowly)    Frequency  7X/week    PT Plan Current plan remains appropriate    Co-evaluation             End of Session Equipment Utilized During Treatment: Gait belt Activity Tolerance: Patient tolerated treatment well Patient left: in chair;with call bell/phone within reach;with chair alarm set     Time: [id number] PT Time Calculation (min) (ACUTE ONLY): 29 min  Charges:  $Gait Training: 8-22 mins $Therapeutic Exercise: 8-22 mins                    G Codes:      Charlaine Dalton, PTA 11/08/2015, 10:30 AM

## 2015-11-09 LAB — URINE CULTURE: Culture: NO GROWTH

## 2015-11-09 LAB — CBC
HEMATOCRIT: 23.2 % — AB (ref 40.0–52.0)
Hemoglobin: 7.4 g/dL — ABNORMAL LOW (ref 13.0–18.0)
MCH: 23.7 pg — AB (ref 26.0–34.0)
MCHC: 32 g/dL (ref 32.0–36.0)
MCV: 73.9 fL — AB (ref 80.0–100.0)
PLATELETS: 439 10*3/uL (ref 150–440)
RBC: 3.14 MIL/uL — ABNORMAL LOW (ref 4.40–5.90)
RDW: 21.2 % — AB (ref 11.5–14.5)
WBC: 14.8 10*3/uL — AB (ref 3.8–10.6)

## 2015-11-09 MED ORDER — AMOXICILLIN-POT CLAVULANATE 875-125 MG PO TABS
1.0000 | ORAL_TABLET | Freq: Two times a day (BID) | ORAL | Status: DC
  Administered 2015-11-09 – 2015-11-12 (×7): 1 via ORAL
  Filled 2015-11-09 (×7): qty 1

## 2015-11-09 MED ORDER — AMOXICILLIN-POT CLAVULANATE 875-125 MG PO TABS: 1.0000 | ORAL_TABLET | Freq: Two times a day (BID) | ORAL | Status: DC

## 2015-11-09 MED ORDER — OXYCODONE-ACETAMINOPHEN 5-325 MG PO TABS: 1.0000 | ORAL_TABLET | ORAL | Status: DC | PRN

## 2015-11-09 NOTE — Progress Notes (Signed)
MD ordered patient to be discharged back to SNF.  Report was called to Corene Cornea, Therapist, sports at [facility] and he voiced understanding.  Prescription and follow-up information was placed in the packet that will go with EMS.  IV was removed with catheter intact.  Waiting on EMS to get here to transport the patient.

## 2015-11-09 NOTE — Discharge Summary (Addendum)
[facility]    Discharge Summary    Patient ID:  Timothy Juarez MRN: [id number] DOB/AGE: 1950-08-24 65 y.o.  Admit date: 11/01/2015 Discharge date: 11/09/2015 Date of Surgery: 11/01/2015 Surgeon: Surgeon(s): Algernon Huxley, MD  Admission Diagnosis: ASO W ULCERATION , STUMP WOUND  Discharge Diagnoses:  ASO W ULCERATION , STUMP WOUND  Secondary Diagnoses: Past Medical History  Diagnosis Date  . Hypertension   . Peripheral vascular disease ([place])   . CHF (congestive heart failure) ([place])   . COPD (chronic obstructive pulmonary disease) ([place])   . Hypokalemia   . Insomnia   . Pressure ulcer   . ARF (acute respiratory failure) ([place])     H/O  . Muscle contracture     MUSCLE SPASMS    Procedure(s): AMPUTATION ABOVE KNEE  Discharged Condition: good  HPI:  The patient is a 65 year old male who presents with left BKA wound breakdown and necrosis who subsequently underwent a left above-the-knee amputation on 11/01/15. He tolerated the procedure well and was transferred from the PACU to the surgical floor without complication. Patient received two units PRBC for symptomatic anemia with a Hbg of 6.2 on POD #1. Patient with elevated WBC count on POD #5, low grade fever 100.5 on POD #6: all cultures and xray negative no signs of infection from stump however patient placed on IV Zosyn which was transitioned to Augmentin upon discharge. Patient received both occupation and physical therapy while a inpatient. POD#7 patient WBC trending down.   Hospital Course:  Timothy Juarez is a 65 y.o. male is S/P Left Procedure(s): AMPUTATION ABOVE KNEE  Extubated: POD # 0  Physical exam:  A&Ox3, NAD CV: RRR Pulmonary: CTA Bilaterally Abdomen: Soft, Nontender, Nondistended Vascular: Left Lower Extremity: staples intact, clean and dry. Minimal to moderate swelling noted of thigh. No drainage, no erythema, no signs of infection noted.   Post-op wounds  clean, dry, intact or healing well  Pt. Ambulating, voiding and taking PO diet without difficulty. Pt pain controlled with PO pain meds.  Labs as below  Complications:none  Consults:   Physical Therapy Occupational Therapy  Significant Diagnostic Studies: CBC Lab Results  Component Value Date   WBC 14.8* 11/09/2015   HGB 7.4* 11/09/2015   HCT 23.2* 11/09/2015   MCV 73.9* 11/09/2015   PLT 439 11/09/2015   BMET    Component Value Date/Time   NA 137 11/05/2015 0602   NA 136 12/01/2013 1317   K 3.2* 11/05/2015 0602   K 3.9 12/01/2013 1317   CL 108 11/05/2015 0602   CL 100 12/01/2013 1317   CO2 26 11/05/2015 0602   CO2 33* 12/01/2013 1317   GLUCOSE 103* 11/05/2015 0602   GLUCOSE 91 12/01/2013 1317   BUN 16 11/05/2015 0602   BUN 24* 09/25/2014 0846   CREATININE 0.93 11/05/2015 0602   CREATININE 1.29 09/25/2014 0846   CALCIUM 7.9* 11/05/2015 0602   CALCIUM 9.4 12/01/2013 1317   GFRNONAA >60 11/05/2015 0602   GFRNONAA 60* 09/25/2014 0846   GFRNONAA >60 03/16/2014 0750   GFRAA >60 11/05/2015 0602   GFRAA >60 09/25/2014 0846   GFRAA >60 03/16/2014 0750   COAG Lab Results  Component Value Date   INR 1.20 10/30/2015   INR 1.17 10/18/2015   INR 1.38 09/14/2015   Disposition:  Discharge to : SNF    Medication List    TAKE these medications        acetaminophen 325 MG tablet  Commonly  known as:  TYLENOL  Take 650 mg by mouth at bedtime.     amoxicillin-clavulanate 875-125 MG tablet  Commonly known as:  AUGMENTIN  Take 1 tablet by mouth every 12 (twelve) hours.     apixaban 2.5 MG Tabs tablet  Commonly known as:  ELIQUIS  Take 2.5 mg by mouth 2 (two) times daily.     aspirin 81 MG EC tablet  Take 1 tablet (81 mg total) by mouth daily.     baclofen 10 MG tablet  Commonly known as:  LIORESAL  Take 5 mg by mouth 3 (three) times daily. Reported on 09/14/2015     doxycycline 100 MG tablet  Commonly known as:  VIBRA-TABS  Take 100 mg by mouth.     Fish  Oil 1000 MG Caps  Take 1,000 mg by mouth every morning.     Fluticasone-Salmeterol 250-50 MCG/DOSE Aepb  Commonly known as:  ADVAIR  Inhale 1 puff into the lungs 2 (two) times daily.     furosemide 40 MG tablet  Commonly known as:  LASIX  Take 40 mg by mouth 2 (two) times daily.     gabapentin 100 MG capsule  Commonly known as:  NEURONTIN  Take 100 mg by mouth at bedtime.     metoprolol tartrate 25 MG tablet  Commonly known as:  LOPRESSOR  Take 0.5 tablets (12.5 mg total) by mouth 2 (two) times daily.     oxyCODONE-acetaminophen 5-325 MG tablet  Commonly known as:  PERCOCET/ROXICET  Take 2 tablets by mouth every 4 (four) hours as needed for moderate pain.     oxyCODONE-acetaminophen 5-325 MG tablet  Commonly known as:  PERCOCET/ROXICET  Take 1-2 tablets by mouth every 4 (four) hours as needed for moderate pain.     potassium chloride 10 MEQ tablet  Commonly known as:  K-DUR  Take 20 mEq by mouth daily.     sodium chloride 0.65 % Soln nasal spray  Commonly known as:  OCEAN  Place 1 spray into both nostrils as needed for congestion.     temazepam 15 MG capsule  Commonly known as:  RESTORIL  Take 15 mg by mouth at bedtime as needed for sleep.     tiotropium 18 MCG inhalation capsule  Commonly known as:  SPIRIVA  Place 18 mcg into inhaler and inhale daily.       Verbal and written Discharge instructions given to the patient. Wound care per Discharge AVS Follow-up Information    Follow up with DEW,JASON, MD In 1 week.   Specialties:  Vascular Surgery, Radiology, Interventional Cardiology   Why:  Staple Removal   Contact information:   [place] [address] [phone]      Signed: Sela Hua, PA-C  11/09/2015, 11:59 AM   Addendum. The patient's discharge was held for fevers. He began having fevers on March 24. At that time, his white blood cell count was elevated. This has since normalized. He has had 2 chest x-rays, urinalysis, C.  difficile, and blood cultures which were all negative. His stump is looked fine. I believe now he can be discharged with an exhaustive workup for his fevers that was unrevealing.

## 2015-11-09 NOTE — Clinical Social Work Note (Signed)
Patient to discharge to return to [facility]. CSW has sent discharge information and nurse to call report. Patient is aware and in agreement for return to [facility]. Shela Leff MSW,LCSW [phone]

## 2015-11-09 NOTE — Progress Notes (Signed)
EMS arrived on the unit to pick up the patient.  We obtained a set of vital signs prior to transfer and the patient had spiked a fever of 101.9.  I called Dr. Lucky Cowboy to let him know and he gave a verbal order to hold the discharge.  He also said it was ok to leave the IV out.  [place], SW called [facility] to let them know.

## 2015-11-09 NOTE — Progress Notes (Signed)
[facility]  Daily Progress Note   Subjective: 8 Days Post-Op: left above-the-knee amputation  Patient pain controlled through PO medication. Zosyn transitioned to PO Augmentin in preparation for discharge home today. WBC trending down.   Objective: Filed Vitals:   11/08/15 2053 11/09/15 0434 11/09/15 0954 11/09/15 0954  BP: 112/51 110/47 110/54 110/54  Pulse: 92 81 109 109  Temp: 100.2 F (37.9 C) 98.3 F (36.8 C)    TempSrc: Oral Oral    Resp: 20 20    Height:      Weight:      SpO2: 99% 98%      Intake/Output Summary (Last 24 hours) at 11/09/15 1144 Last data filed at 11/09/15 0900  Gross per 24 hour  Intake  257.3 ml  Output    775 ml  Net -517.7 ml   Physical Exam: Physical exam:  A&Ox3, NAD CV: RRR Pulmonary: CTA Bilaterally Abdomen: Soft, Nontender, Nondistended Vascular: Left Lower Extremity: staples intact, clean and dry. Minimal to moderate swelling noted of thigh. No drainage, no erythema, no signs of infection noted.    Laboratory: CBC    Component Value Date/Time   WBC 14.8* 11/09/2015 0442   WBC 11.1* 12/01/2013 1317   HGB 7.4* 11/09/2015 0442   HGB 14.3 12/01/2013 1317   HCT 23.2* 11/09/2015 0442   HCT 43.9 12/01/2013 1317   PLT 439 11/09/2015 0442   PLT 250 12/01/2013 1317   BMET    Component Value Date/Time   NA 137 11/05/2015 0602   NA 136 12/01/2013 1317   K 3.2* 11/05/2015 0602   K 3.9 12/01/2013 1317   CL 108 11/05/2015 0602   CL 100 12/01/2013 1317   CO2 26 11/05/2015 0602   CO2 33* 12/01/2013 1317   GLUCOSE 103* 11/05/2015 0602   GLUCOSE 91 12/01/2013 1317   BUN 16 11/05/2015 0602   BUN 24* 09/25/2014 0846   CREATININE 0.93 11/05/2015 0602   CREATININE 1.29 09/25/2014 0846   CALCIUM 7.9* 11/05/2015 0602   CALCIUM 9.4 12/01/2013 1317   GFRNONAA >60 11/05/2015 0602   GFRNONAA 60* 09/25/2014 0846   GFRNONAA >60 03/16/2014 0750   GFRAA >60 11/05/2015 0602   GFRAA >60 09/25/2014 0846   GFRAA >60 03/16/2014 0750   Assessment/Planning: The patient is a 65 year old male who presents left BKA wound breakdown and necrosis who subsequently underwent a left above-the-knee amputation on 11/01/15. He tolerated the procedure well and was transferred from the PACU to the surgical floor without complication. Patient received two units PRBC for symptomatic anemia with a Hbg of 6.2 on POD #1. Patient with elevated WBC count on POD #5, low grade fever 100.5 on POD #6: all cultures and xray negative no signs of infection from stump however patient placed on IV Zosyn which was transitioned to Augmentin upon discharge. Patient received both occupation and physical therapy while a inpatient. POD#7 patient WBC trending down.  1) OK to discharge back to nursing home. 2) Zosyn to Augmentin 3) Discussed with Dr. Ellis Parents [facility] PA-C 11/09/2015 11:44 AM

## 2015-11-10 ENCOUNTER — Inpatient Hospital Stay: Payer: Medicaid Other

## 2015-11-10 LAB — CREATININE, SERUM
CREATININE: 1.06 mg/dL (ref 0.61–1.24)
GFR calc Af Amer: >60 mL/min (ref >60)
GFR calc non Af Amer: >60 mL/min (ref >60)

## 2015-11-10 NOTE — Progress Notes (Signed)
PT Cancellation Note  Patient Details Name: Timothy Juarez MRN: [id number] DOB: [birth year]   Cancelled Treatment:    Reason Eval/Treat Not Completed: Patient at procedure or test/unavailable PT attempted treatment today. Pt was not in room. Nursing stated he is having a  Chest Xray.    Gorden Harms. Garrick Midgley, PT, DPT [phone]  Kaysea Raya 11/10/2015, 2:17 PM

## 2015-11-10 NOTE — Progress Notes (Signed)
Subjective  -   C/o cough and pain at R AKA stump   Physical Exam:  R AKA stump tender to touch.  No skin breakdown, No drainage       Assessment/Plan:    Continues to have low grade fevers, will check CXR given cough Will stay in hospital overnight  [place], [place] 11/10/2015 1:20 PM --  Filed Vitals:   11/10/15 0452 11/10/15 1054  BP: 102/42 115/58  Pulse: 78 99  Temp: 98.9 F (37.2 C) 100.6 F (38.1 C)  Resp: 20     Intake/Output Summary (Last 24 hours) at 11/10/15 1320 Last data filed at 11/10/15 1100  Gross per 24 hour  Intake    300 ml  Output    500 ml  Net   -200 ml     Laboratory CBC    Component Value Date/Time   WBC 14.8* 11/09/2015 0442   WBC 11.1* 12/01/2013 1317   HGB 7.4* 11/09/2015 0442   HGB 14.3 12/01/2013 1317   HCT 23.2* 11/09/2015 0442   HCT 43.9 12/01/2013 1317   PLT 439 11/09/2015 0442   PLT 250 12/01/2013 1317    BMET    Component Value Date/Time   NA 137 11/05/2015 0602   NA 136 12/01/2013 1317   K 3.2* 11/05/2015 0602   K 3.9 12/01/2013 1317   CL 108 11/05/2015 0602   CL 100 12/01/2013 1317   CO2 26 11/05/2015 0602   CO2 33* 12/01/2013 1317   GLUCOSE 103* 11/05/2015 0602   GLUCOSE 91 12/01/2013 1317   BUN 16 11/05/2015 0602   BUN 24* 09/25/2014 0846   CREATININE 1.06 11/10/2015 0746   CREATININE 1.29 09/25/2014 0846   CALCIUM 7.9* 11/05/2015 0602   CALCIUM 9.4 12/01/2013 1317   GFRNONAA >60 11/10/2015 0746   GFRNONAA 60* 09/25/2014 0846   GFRNONAA >60 03/16/2014 0750   GFRAA >60 11/10/2015 0746   GFRAA >60 09/25/2014 0846   GFRAA >60 03/16/2014 0750    COAG Lab Results  Component Value Date   INR 1.20 10/30/2015   INR 1.17 10/18/2015   INR 1.38 09/14/2015   No results found for: PTT  Antibiotics Anti-infectives    Start     Dose/Rate Route Frequency Ordered Stop   11/09/15 1145  amoxicillin-clavulanate (AUGMENTIN) 875-125 MG per tablet 1 tablet     1 tablet Oral Every 12 hours 11/09/15 1142      11/09/15 0000  amoxicillin-clavulanate (AUGMENTIN) 875-125 MG tablet     1 tablet Oral Every 12 hours 11/09/15 1152     11/06/15 1000  piperacillin-tazobactam (ZOSYN) IVPB 3.375 g  Status:  Discontinued     3.375 g 12.5 mL/hr over 240 Minutes Intravenous Every 6 hours 11/06/15 0845 11/06/15 0855   11/06/15 0900  piperacillin-tazobactam (ZOSYN) IVPB 3.375 g  Status:  Discontinued     3.375 g 12.5 mL/hr over 240 Minutes Intravenous 3 times per day 11/06/15 0855 11/09/15 1143   11/01/15 2200  doxycycline (VIBRA-TABS) tablet 100 mg     100 mg Oral Every 12 hours 11/01/15 1816     11/01/15 1930  cefUROXime (ZINACEF) 1.5 g in dextrose 5 % 50 mL IVPB     1.5 g 100 mL/hr over 30 Minutes Intravenous Every 12 hours 11/01/15 1816 11/02/15 0730   11/01/15 1343  ceFAZolin (ANCEF) 2-3 GM-% IVPB SOLR    Comments:  Idamae Lusher: cabinet override      11/01/15 1343 11/02/15 0144   11/01/15 [id number]  ceFAZolin (ANCEF) IVPB 2 g/50 mL premix     2 g 100 mL/hr over 30 Minutes Intravenous On call to O.R. 11/01/15 [id number] 11/01/15 1553       V. Leia Alf, M.D. [facility] Office: [phone] Pager:  [phone]

## 2015-11-11 LAB — CBC
HEMATOCRIT: 23.2 % — AB (ref 40.0–52.0)
HEMOGLOBIN: 7.6 g/dL — AB (ref 13.0–18.0)
MCH: 24.2 pg — ABNORMAL LOW (ref 26.0–34.0)
MCHC: 32.6 g/dL (ref 32.0–36.0)
MCV: 74.3 fL — ABNORMAL LOW (ref 80.0–100.0)
Platelets: 421 10*3/uL (ref 150–440)
RBC: 3.12 MIL/uL — ABNORMAL LOW (ref 4.40–5.90)
RDW: 21.4 % — ABNORMAL HIGH (ref 11.5–14.5)
WBC: 10.6 10*3/uL (ref 3.8–10.6)

## 2015-11-11 NOTE — Progress Notes (Signed)
Physical Therapy Treatment Patient Details Name: Timothy Juarez MRN: [id number] DOB: 18-Mar-1951 Today's Date: 11/11/2015    History of Present Illness Pt is admitted for L  AKA. Pt initially with L foot gangere->BKA  few months ago and is now s/p AKA secondary to wound complications.     PT Comments    Pt is not excited about doing too much with PT, but is willing to participate with exercises.  When we were going to get up PT noticed pt had a small stool and pt said he needed to go to use the [facility].  He did well hopping to get to commode, further PT/mobiltiy deferred.  Follow Up Recommendations  SNF     Equipment Recommendations  Rolling walker with 5" wheels;Wheelchair (measurements PT)    Recommendations for Other Services       Precautions / Restrictions Precautions Precautions: Fall Restrictions [name] Weight Bearing: Non weight bearing    Mobility  Bed Mobility Overal bed mobility: Modified Independent Bed Mobility: Supine to Sit     Supine to sit: Modified independent (Device/Increase time)     General bed mobility comments: Pt continues to be slow and cautious with getting up to EOB  Transfers Overall transfer level: Needs assistance Equipment used: Rolling walker (2 wheeled) Transfers: Sit to/from Stand Sit to Stand: Mod assist;Min assist         General transfer comment: Pt needs assist to rise from bed and maintain balance.  Pt does show good strength/control with the L LE.  Ambulation/Gait             General Gait Details: Pt is able to take a few little hopping steps to get to the commode, no true ambulation.   Stairs            Wheelchair Mobility    Modified Rankin (Stroke Patients Only)       Balance                                    Cognition Arousal/Alertness: Awake/alert Behavior During Therapy: WFL for tasks assessed/performed Overall Cognitive Status: Within Functional Limits for tasks assessed                       Exercises General Exercises - Lower Extremity Ankle Circles/Pumps: 10 reps;Right;AROM Long Arc Quad: Right;10 reps;Strengthening Hip ABduction/ADduction: Strengthening;Both;10 reps Hip Flexion/Marching: Strengthening;Both;10 reps    General Comments        Pertinent Vitals/Pain Pain Assessment:  (not rated, is sensitive with L limp movement/touch)    Home Living                      Prior Function            PT Goals (current goals can now be found in the care plan section) Progress towards PT goals: Progressing toward goals    Frequency  7X/week    PT Plan Current plan remains appropriate    Co-evaluation             End of Session Equipment Utilized During Treatment: Gait belt Activity Tolerance: Patient tolerated treatment well Patient left: in chair;with call bell/phone within reach;with nursing/sitter in room     Time: 1355-1422 PT Time Calculation (min) (ACUTE ONLY): 27 min  Charges:  $Therapeutic Exercise: 8-22 mins $Therapeutic Activity: 8-22 mins  G Codes:     Wayne Both, PT, DPT [phone]  Kreg Shropshire 11/11/2015, 3:51 PM

## 2015-11-11 NOTE — Progress Notes (Signed)
Subjective  -   A little better today   Physical Exam:  Stump still tender No drainage or erythema      Assessment/Plan:    CXR without infiltrate Fever curve better Anticipate d/c tomorrow  [name] 11/11/2015 12:07 PM --  Filed Vitals:   11/10/15 2002 11/11/15 0553  BP: 108/57 107/45  Pulse: 97 78  Temp: 99.3 F (37.4 C) 98.6 F (37 C)  Resp: 20 20    Intake/Output Summary (Last 24 hours) at 11/11/15 1207 Last data filed at 11/11/15 0830  Gross per 24 hour  Intake    460 ml  Output    200 ml  Net    260 ml     Laboratory CBC    Component Value Date/Time   WBC 10.6 11/11/2015 0734   WBC 11.1* 12/01/2013 1317   HGB 7.6* 11/11/2015 0734   HGB 14.3 12/01/2013 1317   HCT 23.2* 11/11/2015 0734   HCT 43.9 12/01/2013 1317   PLT 421 11/11/2015 0734   PLT 250 12/01/2013 1317    BMET    Component Value Date/Time   NA 137 11/05/2015 0602   NA 136 12/01/2013 1317   K 3.2* 11/05/2015 0602   K 3.9 12/01/2013 1317   CL 108 11/05/2015 0602   CL 100 12/01/2013 1317   CO2 26 11/05/2015 0602   CO2 33* 12/01/2013 1317   GLUCOSE 103* 11/05/2015 0602   GLUCOSE 91 12/01/2013 1317   BUN 16 11/05/2015 0602   BUN 24* 09/25/2014 0846   CREATININE 1.06 11/10/2015 0746   CREATININE 1.29 09/25/2014 0846   CALCIUM 7.9* 11/05/2015 0602   CALCIUM 9.4 12/01/2013 1317   GFRNONAA >60 11/10/2015 0746   GFRNONAA 60* 09/25/2014 0846   GFRNONAA >60 03/16/2014 0750   GFRAA >60 11/10/2015 0746   GFRAA >60 09/25/2014 0846   GFRAA >60 03/16/2014 0750    COAG Lab Results  Component Value Date   INR 1.20 10/30/2015   INR 1.17 10/18/2015   INR 1.38 09/14/2015   No results found for: PTT  Antibiotics Anti-infectives    Start     Dose/Rate Route Frequency Ordered Stop   11/09/15 1145  amoxicillin-clavulanate (AUGMENTIN) 875-125 MG per tablet 1 tablet     1 tablet Oral Every 12 hours 11/09/15 1142     11/09/15 0000  amoxicillin-clavulanate (AUGMENTIN)  875-125 MG tablet     1 tablet Oral Every 12 hours 11/09/15 1152     11/06/15 1000  piperacillin-tazobactam (ZOSYN) IVPB 3.375 g  Status:  Discontinued     3.375 g 12.5 mL/hr over 240 Minutes Intravenous Every 6 hours 11/06/15 0845 11/06/15 0855   11/06/15 0900  piperacillin-tazobactam (ZOSYN) IVPB 3.375 g  Status:  Discontinued     3.375 g 12.5 mL/hr over 240 Minutes Intravenous 3 times per day 11/06/15 0855 11/09/15 1143   11/01/15 2200  doxycycline (VIBRA-TABS) tablet 100 mg     100 mg Oral Every 12 hours 11/01/15 1816     11/01/15 1930  cefUROXime (ZINACEF) 1.5 g in dextrose 5 % 50 mL IVPB     1.5 g 100 mL/hr over 30 Minutes Intravenous Every 12 hours 11/01/15 1816 11/02/15 0730   11/01/15 1343  ceFAZolin (ANCEF) 2-3 GM-% IVPB SOLR    Comments:  Idamae Lusher: cabinet override      11/01/15 1343 11/02/15 0144   11/01/15 0208  ceFAZolin (ANCEF) IVPB 2 g/50 mL premix     2 g 100 mL/hr over  30 Minutes Intravenous On call to O.R. 11/01/15 [id number] 11/01/15 1553       V. Leia Alf, M.D. [facility] Office: [phone] Pager:  [phone]

## 2015-11-12 LAB — CULTURE, BLOOD (SINGLE): CULTURE: NO GROWTH

## 2015-11-12 NOTE — Progress Notes (Signed)
Per Dr. Lucky Cowboy hold metoprolol for low BP and HR of 100

## 2015-11-12 NOTE — Progress Notes (Signed)
Pt A and O x 4. VSS. Pt tolerating diet well. No complaints of pain or nausea.  Report called to Guerry Minors at [facility]. Packet given to EMS. Pt transferred. Incision is clean dry and intact with staples in place.

## 2015-11-12 NOTE — Progress Notes (Signed)
Physical Therapy Treatment Patient Details Name: Timothy Juarez MRN: [id number] DOB: 05/29/1951 Today's Date: 11/12/2015    History of Present Illness Pt is admitted for L  AKA. Pt initially with L foot gangere->BKA  few months ago and is now s/p AKA secondary to wound complications.     PT Comments    Pt. is 66 y.o. M with L AKA POD #11. Pt able to perform bed mobility with modified independence, demonstrating increased time and use of UE. Pt. able to perform sit-to-stand transfer using RW and min. assist. Pt required cueing for hand placement on walker and bed for transfer. Pt. able to ambulate ~3 ft by hopping on R LE to WC using RW and min assist. Pt. self-propelled WC approx. 160 ft, stopping twice due to fatigue. Pt performed supine there-ex on B LE with no assist.   Follow Up Recommendations  SNF     Equipment Recommendations  Rolling walker with 5" wheels;Wheelchair (measurements PT)    Recommendations for Other Services       Precautions / Restrictions Precautions Precautions: Fall Restrictions Weight Bearing Restrictions: Yes [name] Weight Bearing: Non weight bearing    Mobility  Bed Mobility Overal bed mobility: Modified Independent Bed Mobility: Supine to Sit     Supine to sit: Modified independent (Device/Increase time)     General bed mobility comments: Pt continues to be slow and cautious with getting up to EOB  Transfers Overall transfer level: Needs assistance Equipment used: Rolling walker (2 wheeled) Transfers: Sit to/from Stand Sit to Stand: Min assist         General transfer comment: Pt needs assist to rise from bed and maintain balance. Pt needed cues for proper hand placement. Pt shows good strength/control once standing.   Ambulation/Gait Ambulation/Gait assistance: Min assist Ambulation Distance (Feet): 3 Feet Assistive device: Rolling walker (2 wheeled)       General Gait Details: Pt. able to take a few hopping steps to WC.     Stairs            Information systems manager mobility: Yes Wheelchair propulsion: Both upper extremities;Right lower extremity Wheelchair parts: Supervision/cueing Distance: 160 ft Wheelchair Assistance Details (indicate cue type and reason): ambulates slowly, stopped twice due to fatigue   Modified Rankin (Stroke Patients Only)       Balance                                    Cognition Arousal/Alertness: Awake/alert Behavior During Therapy: WFL for tasks assessed/performed Overall Cognitive Status: Within Functional Limits for tasks assessed                      Exercises Other Exercises Other Exercises: Pt. performed supine ther-ex including R LE ankle pumps, SLR, and hip ab/ad with no assist. Pt performed R LE hip flexion and ab/ad with no assist. All ther-ex peformed x10 reps. Education provided regarding positioning in bed and avoidance of contractures.      General Comments General comments (skin integrity, edema, etc.): bandage removed, staples in place, no active bleeding noted, skin dry      Pertinent Vitals/Pain Pain Assessment: No/denies pain (no value, stated phantom limb pain only occasionally) Pain Location: L LE    Home Living                      Prior Function  PT Goals (current goals can now be found in the care plan section) Progress towards PT goals: Progressing toward goals    Frequency  7X/week    PT Plan Current plan remains appropriate    Co-evaluation             End of Session Equipment Utilized During Treatment: Gait belt Activity Tolerance: Patient tolerated treatment well Patient left: in bed;with call bell/phone within reach;with bed alarm set     Time: [id number] PT Time Calculation (min) (ACUTE ONLY): 24 min  Charges:                       G Codes:      Sherral Hammers Dec 06, 2015, 12:04 PM M. Barnett Abu, SPT

## 2015-11-12 NOTE — Clinical Social Work Note (Signed)
Patient discharging to [facility] today. Discharge information sent and nurse to call report. Patient is aware of his discharge and in agreement.  Patient to go via EMS and CSW has notified Mureena at [place] [facility] that patient may be a late admission. Shela Leff MSW,LCSW [phone]

## 2016-03-19 ENCOUNTER — Other Ambulatory Visit
Admission: RE | Admit: 2016-03-19 | Discharge: 2016-03-19 | Disposition: A | Discharge status: Home / Self Care | Payer: Medicaid Other | Source: Ambulatory Visit | Attending: Nurse Practitioner | Admitting: Nurse Practitioner

## 2016-03-19 DIAGNOSIS — D649 Anemia, unspecified: Secondary | ICD-10-CM | POA: Diagnosis not present

## 2016-03-19 LAB — CBC WITH DIFFERENTIAL/PLATELET
BASOS ABS: 0.2 10*3/uL — AB (ref 0–0.1)
BASOS PCT: 1 %
EOS ABS: 0.3 10*3/uL (ref 0–0.7)
EOS PCT: 2 %
HCT: 30.2 % — ABNORMAL LOW (ref 40.0–52.0)
Hemoglobin: 9.4 g/dL — ABNORMAL LOW (ref 13.0–18.0)
LYMPHS PCT: 15 %
Lymphs Abs: 2.3 10*3/uL (ref 1.0–3.6)
MCH: 19.2 pg — ABNORMAL LOW (ref 26.0–34.0)
MCHC: 31.3 g/dL — ABNORMAL LOW (ref 32.0–36.0)
MCV: 61.5 fL — ABNORMAL LOW (ref 80.0–100.0)
Monocytes Absolute: 0.9 10*3/uL (ref 0.2–1.0)
Monocytes Relative: 6 %
Neutro Abs: 12.1 10*3/uL — ABNORMAL HIGH (ref 1.4–6.5)
Neutrophils Relative %: 76 %
PLATELETS: 442 10*3/uL — AB (ref 150–440)
RBC: 4.91 MIL/uL (ref 4.40–5.90)
RDW: 21.4 % — ABNORMAL HIGH (ref 11.5–14.5)
WBC: 15.8 10*3/uL — AB (ref 3.8–10.6)

## 2016-03-21 DIAGNOSIS — I82412 Acute embolism and thrombosis of left femoral vein: Secondary | ICD-10-CM | POA: Diagnosis not present

## 2016-03-25 ENCOUNTER — Other Ambulatory Visit: Payer: Self-pay | Admitting: Vascular Surgery

## 2016-03-25 DIAGNOSIS — I739 Peripheral vascular disease, unspecified: Secondary | ICD-10-CM | POA: Diagnosis not present

## 2016-03-28 ENCOUNTER — Other Ambulatory Visit
Admission: RE | Admit: 2016-03-28 | Discharge: 2016-03-28 | Disposition: A | Discharge status: Home / Self Care | Payer: Medicaid Other | Source: Ambulatory Visit | Attending: Vascular Surgery | Admitting: Vascular Surgery

## 2016-03-28 DIAGNOSIS — Z0000 Encounter for general adult medical examination without abnormal findings: Secondary | ICD-10-CM | POA: Diagnosis present

## 2016-03-28 LAB — CREATININE, SERUM
Creatinine, Ser: 0.9 mg/dL (ref 0.61–1.24)
GFR calc non Af Amer: >60 mL/min (ref >60)

## 2016-03-28 LAB — BUN: BUN: 18 mg/dL (ref 6–20)

## 2016-03-31 ENCOUNTER — Encounter
Admission: RE | Disposition: A | Discharge status: Home / Self Care | Payer: Self-pay | Source: Ambulatory Visit | Attending: Vascular Surgery

## 2016-03-31 ENCOUNTER — Ambulatory Visit
Admission: RE | Admit: 2016-03-31 | Discharge: 2016-03-31 | Disposition: A | Discharge status: Home / Self Care | Payer: Medicaid Other | Source: Ambulatory Visit | Attending: Vascular Surgery | Admitting: Vascular Surgery

## 2016-03-31 DIAGNOSIS — M7989 Other specified soft tissue disorders: Secondary | ICD-10-CM | POA: Insufficient documentation

## 2016-03-31 DIAGNOSIS — J449 Chronic obstructive pulmonary disease, unspecified: Secondary | ICD-10-CM | POA: Diagnosis not present

## 2016-03-31 DIAGNOSIS — Z87891 Personal history of nicotine dependence: Secondary | ICD-10-CM | POA: Diagnosis not present

## 2016-03-31 DIAGNOSIS — Z7951 Long term (current) use of inhaled steroids: Secondary | ICD-10-CM | POA: Diagnosis not present

## 2016-03-31 DIAGNOSIS — I110 Hypertensive heart disease with heart failure: Secondary | ICD-10-CM | POA: Diagnosis not present

## 2016-03-31 DIAGNOSIS — Z8249 Family history of ischemic heart disease and other diseases of the circulatory system: Secondary | ICD-10-CM | POA: Diagnosis not present

## 2016-03-31 DIAGNOSIS — M2560 Stiffness of unspecified joint, not elsewhere classified: Secondary | ICD-10-CM | POA: Diagnosis not present

## 2016-03-31 DIAGNOSIS — Z808 Family history of malignant neoplasm of other organs or systems: Secondary | ICD-10-CM | POA: Insufficient documentation

## 2016-03-31 DIAGNOSIS — I70238 Atherosclerosis of native arteries of right leg with ulceration of other part of lower right leg: Secondary | ICD-10-CM | POA: Insufficient documentation

## 2016-03-31 DIAGNOSIS — Z89612 Acquired absence of left leg above knee: Secondary | ICD-10-CM | POA: Insufficient documentation

## 2016-03-31 DIAGNOSIS — I509 Heart failure, unspecified: Secondary | ICD-10-CM | POA: Insufficient documentation

## 2016-03-31 DIAGNOSIS — I739 Peripheral vascular disease, unspecified: Secondary | ICD-10-CM | POA: Diagnosis not present

## 2016-03-31 DIAGNOSIS — M79609 Pain in unspecified limb: Secondary | ICD-10-CM | POA: Insufficient documentation

## 2016-03-31 HISTORY — PX: PERIPHERAL VASCULAR CATHETERIZATION: SHX172C

## 2016-03-31 SURGERY — LOWER EXTREMITY ANGIOGRAPHY
Anesthesia: Moderate Sedation | Laterality: Right

## 2016-03-31 MED ORDER — ACETAMINOPHEN 325 MG RE SUPP: 325.0000 mg | RECTAL | Status: DC | PRN

## 2016-03-31 MED ORDER — MIDAZOLAM HCL 2 MG/2ML IJ SOLN
INTRAMUSCULAR | Status: AC
  Filled 2016-03-31: qty 2

## 2016-03-31 MED ORDER — IOPAMIDOL (ISOVUE-300) INJECTION 61%
INTRAVENOUS | Status: DC | PRN
  Administered 2016-03-31: 85 mL via INTRAVENOUS

## 2016-03-31 MED ORDER — DIPHENHYDRAMINE HCL 50 MG/ML IJ SOLN
INTRAMUSCULAR | Status: DC | PRN
  Administered 2016-03-31: 50 mg via INTRAVENOUS

## 2016-03-31 MED ORDER — DIPHENHYDRAMINE HCL 50 MG/ML IJ SOLN
INTRAMUSCULAR | Status: AC
  Filled 2016-03-31: qty 1

## 2016-03-31 MED ORDER — ATORVASTATIN CALCIUM 20 MG PO TABS: 20.0000 mg | ORAL_TABLET | Freq: Every day | ORAL | 3 refills | Status: DC

## 2016-03-31 MED ORDER — LABETALOL HCL 5 MG/ML IV SOLN: 10.0000 mg | INTRAVENOUS | Status: DC | PRN

## 2016-03-31 MED ORDER — MIDAZOLAM HCL 2 MG/2ML IJ SOLN
INTRAMUSCULAR | Status: AC
  Filled 2016-03-31: qty 4

## 2016-03-31 MED ORDER — HYDROMORPHONE HCL 1 MG/ML IJ SOLN: 1.0000 mg | Freq: Once | INTRAMUSCULAR | Status: DC

## 2016-03-31 MED ORDER — HYDRALAZINE HCL 20 MG/ML IJ SOLN: 5.0000 mg | INTRAMUSCULAR | Status: DC | PRN

## 2016-03-31 MED ORDER — FAMOTIDINE 20 MG PO TABS: 40.0000 mg | ORAL_TABLET | ORAL | Status: DC | PRN

## 2016-03-31 MED ORDER — HEPARIN SODIUM (PORCINE) 1000 UNIT/ML IJ SOLN
INTRAMUSCULAR | Status: AC
  Filled 2016-03-31: qty 1

## 2016-03-31 MED ORDER — LIDOCAINE-EPINEPHRINE (PF) 1 %-1:200000 IJ SOLN
INTRAMUSCULAR | Status: AC
  Filled 2016-03-31: qty 30

## 2016-03-31 MED ORDER — SODIUM CHLORIDE 0.9 % IV SOLN: 500.0000 mL | Freq: Once | INTRAVENOUS | Status: DC | PRN

## 2016-03-31 MED ORDER — HEPARIN SODIUM (PORCINE) 1000 UNIT/ML IJ SOLN
INTRAMUSCULAR | Status: DC | PRN
  Administered 2016-03-31: 5000 [IU] via INTRAVENOUS

## 2016-03-31 MED ORDER — ONDANSETRON HCL 4 MG/2ML IJ SOLN: 4.0000 mg | Freq: Four times a day (QID) | INTRAMUSCULAR | Status: DC | PRN

## 2016-03-31 MED ORDER — OXYCODONE-ACETAMINOPHEN 5-325 MG PO TABS: 1.0000 | ORAL_TABLET | ORAL | Status: DC | PRN

## 2016-03-31 MED ORDER — HEPARIN (PORCINE) IN NACL 2-0.9 UNIT/ML-% IJ SOLN
INTRAMUSCULAR | Status: AC
  Filled 2016-03-31: qty 1000

## 2016-03-31 MED ORDER — FENTANYL CITRATE (PF) 100 MCG/2ML IJ SOLN
INTRAMUSCULAR | Status: AC
  Filled 2016-03-31: qty 2

## 2016-03-31 MED ORDER — DEXTROSE 5 % IV SOLN
1.5000 g | INTRAVENOUS | Status: AC
  Administered 2016-03-31: 1.5 g via INTRAVENOUS

## 2016-03-31 MED ORDER — SODIUM CHLORIDE 0.9 % IV SOLN: INTRAVENOUS | Status: DC

## 2016-03-31 MED ORDER — FENTANYL CITRATE (PF) 100 MCG/2ML IJ SOLN
INTRAMUSCULAR | Status: AC
Start: 2016-03-31 — End: 2016-03-31
  Filled 2016-03-31: qty 2

## 2016-03-31 MED ORDER — ACETAMINOPHEN 325 MG PO TABS: 325.0000 mg | ORAL_TABLET | ORAL | Status: DC | PRN

## 2016-03-31 MED ORDER — METOPROLOL TARTRATE 5 MG/5ML IV SOLN: 2.0000 mg | INTRAVENOUS | Status: DC | PRN

## 2016-03-31 MED ORDER — METHYLPREDNISOLONE SODIUM SUCC 125 MG IJ SOLR: 125.0000 mg | INTRAMUSCULAR | Status: DC | PRN

## 2016-03-31 MED ORDER — SODIUM CHLORIDE FLUSH 0.9 % IV SOLN
INTRAVENOUS | Status: AC
  Filled 2016-03-31: qty 10

## 2016-03-31 MED ORDER — MIDAZOLAM HCL 2 MG/2ML IJ SOLN
INTRAMUSCULAR | Status: DC | PRN
  Administered 2016-03-31 (×4): 2 mg via INTRAVENOUS

## 2016-03-31 MED ORDER — FENTANYL CITRATE (PF) 100 MCG/2ML IJ SOLN
INTRAMUSCULAR | Status: DC | PRN
  Administered 2016-03-31 (×6): 50 ug via INTRAVENOUS

## 2016-03-31 MED ORDER — GUAIFENESIN-DM 100-10 MG/5ML PO SYRP: 15.0000 mL | ORAL_SOLUTION | ORAL | Status: DC | PRN

## 2016-03-31 MED ORDER — HYDROMORPHONE HCL 1 MG/ML IJ SOLN: 0.5000 mg | INTRAMUSCULAR | Status: DC | PRN

## 2016-03-31 MED ORDER — PHENOL 1.4 % MT LIQD: 1.0000 | OROMUCOSAL | Status: DC | PRN

## 2016-03-31 SURGICAL SUPPLY — 29 items
BALLN DORADO 6X200X135 (BALLOONS) ×4
BALLN LUTONIX 5X150X130 (BALLOONS) ×4
BALLN LUTONIX 6X150X130 (BALLOONS) ×4
BALLN LUTONIX DCB 7X60X130 (BALLOONS) ×4
BALLN ULTRVRSE 2X150X150 (BALLOONS) ×4
BALLN ULTRVRSE 2X150X150 OTW (BALLOONS) ×2
BALLN ULTRVRSE 3X300X150 (BALLOONS) ×4
BALLN ULTRVRSE 3X300X150 OTW (BALLOONS) ×2
BALLOON DORADO 6X200X135 (BALLOONS) IMPLANT
BALLOON LUTONIX 5X150X130 (BALLOONS) IMPLANT
BALLOON LUTONIX 6X150X130 (BALLOONS) IMPLANT
BALLOON LUTONIX DCB 7X60X130 (BALLOONS) IMPLANT
BALLOON ULTRVRSE 2X150X150 OTW (BALLOONS) IMPLANT
BALLOON ULTRVRSE 3X300X150 OTW (BALLOONS) IMPLANT
CATH CXI SUPP ANG 4FR 135 (MICROCATHETER) IMPLANT
CATH CXI SUPP ANG 4FR 135CM (MICROCATHETER) ×4
CATH PIG 70CM (CATHETERS) ×4 IMPLANT
CATH VERT 100CM (CATHETERS) ×2 IMPLANT
DEVICE PRESTO INFLATION (MISCELLANEOUS) ×2 IMPLANT
DEVICE STARCLOSE SE CLOSURE (Vascular Products) ×2 IMPLANT
GLIDEWIRE ADV .035X260CM (WIRE) ×2 IMPLANT
PACK ANGIOGRAPHY (CUSTOM PROCEDURE TRAY) ×4 IMPLANT
SHEATH ANL2 6FRX45 HC (SHEATH) ×2 IMPLANT
SHEATH BRITE TIP 5FRX11 (SHEATH) ×4 IMPLANT
STENT VIABAHN 6X250X120 (Permanent Stent) ×2 IMPLANT
SYR MEDRAD MARK V 150ML (SYRINGE) ×4 IMPLANT
TUBING CONTRAST HIGH PRESS 72 (TUBING) ×4 IMPLANT
WIRE G V18X300CM (WIRE) ×2 IMPLANT
WIRE J 3MM .035X145CM (WIRE) ×4 IMPLANT

## 2016-03-31 NOTE — H&P (Signed)
  [facility] History & Physical Update  The patient was interviewed and re-examined.  The patient's previous History and Physical has been reviewed and is unchanged.  There is no change in the plan of care. We plan to proceed with the scheduled procedure.  DEW,JASON, MD  03/31/2016, 8:13 AM

## 2016-03-31 NOTE — Op Note (Signed)
[facility] Percutaneous Study/Intervention Procedural Note   Date of Surgery: 03/31/2016  Surgeon(s):DEW,JASON   Assistants:none  Pre-operative Diagnosis: PAD with ulceration right lower extremity  Post-operative diagnosis: Same  Procedure(s) Performed: 1. Ultrasound guidance for vascular access left femoral artery 2. Catheter placement into right anterior tibial artery from left femoral approach 3. Aortogram and selective right lower extremity angiogram 4. Percutaneous transluminal angioplasty of the right anterior tibial artery with a 2 mm diameter angioplasty balloon distally and a 3 mm diameter angioplasty balloon proximally 5. Percutaneous transluminal angioplasty of the right popliteal artery with 5 mm diameter Lutonix drug-coated angioplasty balloon, mid and distal SFA with 5 mm diameter angioplasty balloon, and proximal SFA with 6 mm diameter Lutonix drug-coated angioplasty balloon  6.  Viabahn covered stent placement to the right SFA for residual stenosis after angioplasty using a 6 mm diameter by 25 cm length stent  7.  Percutaneous transluminal angioplasty of the right external iliac artery with 7 mm diameter by 6 cm length Lutonix drug-coated angioplasty balloon 8. StarClose closure device left femoral artery  EBL: 25 cc  Contrast: 85 cc  Fluoro Time: 10.7 minutes  Moderate Conscious Sedation Time: approximately 60 minutes using 8 mg of Versed and 300 mcg of Fentanyl  Indications: Patient is a 65 y.o.male with recurrent nonhealing ulcerations of the right leg despite multiple previous interventions. The patient has noninvasive study showing reduced perfusion in the right leg. The patient is brought in for angiography for further evaluation and potential treatment. Risks and benefits are discussed and informed consent is obtained  Procedure: The patient  was identified and appropriate procedural time out was performed. The patient was then placed supine on the table and prepped and draped in the usual sterile fashion.Moderate conscious sedation was administered during a face to face encounter with the patient throughout the procedure with my supervision of the RN administering medicines and monitoring the patient's vital signs, pulse oximetry, telemetry and mental status throughout from the start of the procedure until the patient was taken to the recovery room. Ultrasound was used to evaluate the left common femoral artery. It was patent . A digital ultrasound image was acquired. A Seldinger needle was used to access the left common femoral artery under direct ultrasound guidance and a permanent image was performed. A 0.035 J wire was advanced without resistance and a 5Fr sheath was placed. Pigtail catheter was placed into the aorta and an AP aortogram was performed. This demonstrated normal renal arteries and normal aorta and iliac segments without significant stenosis on the left and a moderate stenosis of about 60-65% in the right external iliac artery. I then crossed the aortic bifurcation and advanced to the right femoral head. Selective right lower extremity angiogram was then performed. This demonstrated occlusion of the SFA a couple centimeters beyond its origin above the previously placed stents with reconstitution of the popliteal artery. There was then 1 appeared to be a high-grade stenosis or occlusion in the proximal anterior tibial artery as well as an occlusion of the distal anterior tibial artery just above the ankle. This was the only runoff distally. The patient was systemically heparinized and a 6 [place] Ansell sheath was then placed over the [employer] wire. I then used a Kumpe catheter and the advantage wire to navigate through the SFA occlusion and confirm intraluminal flow in the anterior tibial artery after crossing the  high-grade stenosis or occlusion in the anterior tibial artery proximally as well. This helped demonstrate  the distal anterior tibial artery occlusion. I then exchanged for a 0.018 wire and a CXI catheter and was able to cross the occlusion in gaining access into the foot. I then proceed with treatment. The distal anterior tibial artery was treated with a 2 mm diameter by 15 cm length angioplasty balloon inflated to 10 atm for 1 minute. The proximal anterior tibial artery was treated with a 3 mm diameter by 30 cm length angioplasty balloon inflated to 12 atm for 1 minute. I then turned my attention to the popliteal and superficial femoral artery. I started with a 5 mm diameter by 15 cm length Lutonix drug-coated angioplasty balloon in the popliteal artery. This was inflated 8 atm for 1 minute. The same balloon was then pulled into the SFA to treat the distal SFA and mid SFA with inflations to 12-14 atm for 1 minute. The proximal SFA was treated with a 6 mm diameter by 15 cm length Lutonix drug-coated angioplasty balloon inflated to 8 atm for 1 minute. Completion angiogram showed moderate residual stenosis about 60% in the proximal SFA and within the previously placed stents in the mid SFA were 70-80% stenoses that extended for several centimeters. I elected to cover these areas with a covered stent to treat all of the residual stenosis. 6 mm diameter by 25 cm length stent was then deployed and postdilated with a 6 mm [place] balloon with less than 20% residual stenosis identified. The distal opacification was difficult due to continuous patient motion but there appeared to be a mild to moderate residual stenosis in the proximal anterior tibial artery as well as the distal anterior tibial artery. Although was unclear if this was more than 50%, flow was much more brisk than it was previously. I decided to treat his inflow with a 7 mm diameter by 6 cm length Lutonix drug-coated angioplasty balloon in the right external  iliac artery to treat the moderate stenosis in this location. This was inflated to 10 atm 1 minute. Completion angiogram showed about a 20% residual stenosis in the right external iliac artery with a nonflow limiting dissection, and I elected not to place a stent in this location. I elected to terminate the procedure. The sheath was removed and StarClose closure device was deployed in the left femoral artery with excellent hemostatic result. The patient was taken to the recovery room in stable condition having tolerated the procedure well.  Findings:  Aortogram: This demonstrated normal renal arteries and normal aorta and iliac segments without significant stenosis on the left and a moderate stenosis of about 60-65% in the right external iliac artery Right Lower Extremity: Occlusion of the SFA a couple centimeters beyond its origin above the previously placed stents with reconstitution of the popliteal artery. There was then 1 appeared to be a high-grade stenosis or occlusion in the proximal anterior tibial artery as well as an occlusion of the distal anterior tibial artery just above the ankle. This was the only runoff distally.   Disposition: Patient was taken to the recovery room in stable condition having tolerated the procedure well.  Complications: None  DEW,JASON 03/31/2016 10:52 AM

## 2016-03-31 NOTE — Discharge Instructions (Signed)

## 2016-04-03 ENCOUNTER — Emergency Department
Admission: EM | Admit: 2016-04-03 | Discharge: 2016-04-03 | Disposition: A | Discharge status: Home / Self Care | Payer: Medicaid Other | Attending: Student in an Organized Health Care Education/Training Program | Admitting: Student in an Organized Health Care Education/Training Program

## 2016-04-03 ENCOUNTER — Encounter: Payer: Self-pay | Admitting: Emergency Medicine

## 2016-04-03 ENCOUNTER — Emergency Department: Payer: Medicaid Other

## 2016-04-03 DIAGNOSIS — M79661 Pain in right lower leg: Secondary | ICD-10-CM | POA: Diagnosis present

## 2016-04-03 DIAGNOSIS — I130 Hypertensive heart and chronic kidney disease with heart failure and stage 1 through stage 4 chronic kidney disease, or unspecified chronic kidney disease: Secondary | ICD-10-CM | POA: Diagnosis not present

## 2016-04-03 DIAGNOSIS — N189 Chronic kidney disease, unspecified: Secondary | ICD-10-CM | POA: Insufficient documentation

## 2016-04-03 DIAGNOSIS — L97519 Non-pressure chronic ulcer of other part of right foot with unspecified severity: Secondary | ICD-10-CM | POA: Diagnosis not present

## 2016-04-03 DIAGNOSIS — G8918 Other acute postprocedural pain: Secondary | ICD-10-CM

## 2016-04-03 DIAGNOSIS — M7989 Other specified soft tissue disorders: Secondary | ICD-10-CM

## 2016-04-03 DIAGNOSIS — J449 Chronic obstructive pulmonary disease, unspecified: Secondary | ICD-10-CM | POA: Diagnosis not present

## 2016-04-03 DIAGNOSIS — I724 Aneurysm of artery of lower extremity: Secondary | ICD-10-CM | POA: Diagnosis not present

## 2016-04-03 DIAGNOSIS — Z87891 Personal history of nicotine dependence: Secondary | ICD-10-CM | POA: Insufficient documentation

## 2016-04-03 DIAGNOSIS — I509 Heart failure, unspecified: Secondary | ICD-10-CM | POA: Insufficient documentation

## 2016-04-03 DIAGNOSIS — M79604 Pain in right leg: Secondary | ICD-10-CM

## 2016-04-03 DIAGNOSIS — Z791 Long term (current) use of non-steroidal anti-inflammatories (NSAID): Secondary | ICD-10-CM | POA: Insufficient documentation

## 2016-04-03 DIAGNOSIS — Z79899 Other long term (current) drug therapy: Secondary | ICD-10-CM | POA: Diagnosis not present

## 2016-04-03 DIAGNOSIS — I739 Peripheral vascular disease, unspecified: Secondary | ICD-10-CM | POA: Diagnosis not present

## 2016-04-03 DIAGNOSIS — Z89512 Acquired absence of left leg below knee: Secondary | ICD-10-CM | POA: Insufficient documentation

## 2016-04-03 DIAGNOSIS — Z7982 Long term (current) use of aspirin: Secondary | ICD-10-CM | POA: Insufficient documentation

## 2016-04-03 LAB — BASIC METABOLIC PANEL
Anion gap: 9 (ref 5–15)
BUN: 21 mg/dL — AB (ref 6–20)
CALCIUM: 8.8 mg/dL — AB (ref 8.9–10.3)
CHLORIDE: 101 mmol/L (ref 101–111)
CO2: 26 mmol/L (ref 22–32)
CREATININE: 0.99 mg/dL (ref 0.61–1.24)
GFR calc non Af Amer: >60 mL/min (ref >60)
GLUCOSE: 94 mg/dL (ref 65–99)
Potassium: 4 mmol/L (ref 3.5–5.1)
Sodium: 136 mmol/L (ref 135–145)

## 2016-04-03 LAB — CBC WITH DIFFERENTIAL/PLATELET
BASOS PCT: 1 %
Basophils Absolute: 0.1 10*3/uL (ref 0–0.1)
Eosinophils Absolute: 0.6 10*3/uL (ref 0–0.7)
Eosinophils Relative: 4 %
HEMATOCRIT: 27.6 % — AB (ref 40.0–52.0)
Hemoglobin: 8.7 g/dL — ABNORMAL LOW (ref 13.0–18.0)
LYMPHS ABS: 2.1 10*3/uL (ref 1.0–3.6)
Lymphocytes Relative: 13 %
MCH: 19.5 pg — AB (ref 26.0–34.0)
MCHC: 31.6 g/dL — AB (ref 32.0–36.0)
MCV: 61.8 fL — ABNORMAL LOW (ref 80.0–100.0)
MONO ABS: 1.5 10*3/uL — AB (ref 0.2–1.0)
MONOS PCT: 9 %
NEUTROS ABS: 12.6 10*3/uL — AB (ref 1.4–6.5)
Neutrophils Relative %: 75 %
Platelets: 414 10*3/uL (ref 150–440)
RBC: 4.46 MIL/uL (ref 4.40–5.90)
RDW: 20.9 % — AB (ref 11.5–14.5)
WBC: 16.8 10*3/uL — ABNORMAL HIGH (ref 3.8–10.6)

## 2016-04-03 LAB — C-REACTIVE PROTEIN: CRP: 21.4 mg/dL — AB (ref <1.0)

## 2016-04-03 MED ORDER — AMOXICILLIN-POT CLAVULANATE 875-125 MG PO TABS: 1.0000 | ORAL_TABLET | Freq: Two times a day (BID) | ORAL | 0 refills | Status: DC

## 2016-04-03 MED ORDER — AMOXICILLIN-POT CLAVULANATE 875-125 MG PO TABS
1.0000 | ORAL_TABLET | Freq: Once | ORAL | Status: AC
  Administered 2016-04-03: 1 via ORAL
  Filled 2016-04-03: qty 1

## 2016-04-03 NOTE — ED Triage Notes (Signed)
Pt arrived via ems from [facility] with complaints of infected right foot. Upon assessment foot is swollen but pt denies pain.

## 2016-04-03 NOTE — Discharge Instructions (Signed)
Please call Dr. Bunnie Domino office in the a.m. for a same-day appointment to evaluate pseudoaneurysm in the left leg and for wound evaluation.

## 2016-04-03 NOTE — ED Notes (Signed)
Pt awaiting ems transportation, asked if he wanted any food or drink. Pt states he doesn't want any food or drink just wants to "get up out of here."

## 2016-04-03 NOTE — ED Provider Notes (Signed)
[facility] Emergency Department Provider Note    First MD Initiated Contact with Patient 04/03/16 1441     (approximate)  I have reviewed the triage vital signs and the nursing notes.   HISTORY  Chief Complaint Foot Pain    HPI Timothy Juarez is a 65 y.o. male [place] peripheral vascular disease as well as chronic kidney disease and congestive heart failure presents to the ER 3 days post right femoral artery angiography and stent due to severe right lower extremity S face stenosis. Patient was sent to the ER due to worsening swelling, pain and drainage from the wounds. She denies any fevers. No shortness of breath. States of the right lower extremity has started to swell more than previously. He is on Eliquis and has been taking that as directed.States that the nursing facility was providing basic wound care today and noted that he had increased weeping drainage from the foot and that his right great toe fell off today. Based on this they sent him to the ER for further evaluation.   Past Medical History:  Diagnosis Date  . ARF (acute respiratory failure) (HCC)    H/O  . CHF (congestive heart failure) ([place])   . COPD (chronic obstructive pulmonary disease) ([place])   . Hypertension   . Hypokalemia   . Insomnia   . Muscle contracture    MUSCLE SPASMS  . Peripheral vascular disease ([place])   . Pressure ulcer     Patient Active Problem List   Diagnosis Date Noted  . Atherosclerosis of native arteries of extremities with gangrene, left leg ([place]) 11/01/2015  . Status post below knee amputation ([place]) 09/20/2015  . Ischemia of lower extremity 09/14/2015  . Atherosclerotic peripheral vascular disease with ulceration ([place]) 01/18/2015    Past Surgical History:  Procedure Laterality Date  . AMPUTATION Left 09/19/2015   Procedure: AMPUTATION BELOW KNEE;  Surgeon: Algernon Huxley, MD;  Location: [facility] ORS;  Service: Vascular;  Laterality: Left;  . AMPUTATION Left  11/01/2015   Procedure: AMPUTATION ABOVE KNEE;  Surgeon: Algernon Huxley, MD;  Location: [facility] ORS;  Service: Vascular;  Laterality: Left;  . APPLICATION OF WOUND VAC Left 10/18/2015   Procedure: APPLICATION OF WOUND VAC;  Surgeon: Algernon Huxley, MD;  Location: [facility] ORS;  Service: Vascular;  Laterality: Left;  . PERIPHERAL VASCULAR CATHETERIZATION Left 01/18/2015   Procedure: Lower Extremity Angiography;  Surgeon: Algernon Huxley, MD;  Location: [place] [facility];  Service: Cardiovascular;  Laterality: Left;  . PERIPHERAL VASCULAR CATHETERIZATION N/A 01/18/2015   Procedure: Lower Extremity Intervention;  Surgeon: Algernon Huxley, MD;  Location: [place] [facility];  Service: Cardiovascular;  Laterality: N/A;  . PERIPHERAL VASCULAR CATHETERIZATION  07/30/2015   Procedure: Lower Extremity Intervention;  Surgeon: Algernon Huxley, MD;  Location: [place] [facility];  Service: Cardiovascular;;  . PERIPHERAL VASCULAR CATHETERIZATION N/A 07/30/2015   Procedure: Abdominal Aortogram w/Lower Extremity;  Surgeon: Algernon Huxley, MD;  Location: [place] [facility];  Service: Cardiovascular;  Laterality: N/A;  . PERIPHERAL VASCULAR CATHETERIZATION Left 08/22/2015   Procedure: Lower Extremity Angiography;  Surgeon: Algernon Huxley, MD;  Location: [place] [facility];  Service: Cardiovascular;  Laterality: Left;  . PERIPHERAL VASCULAR CATHETERIZATION Left 08/22/2015   Procedure: Lower Extremity Intervention;  Surgeon: Algernon Huxley, MD;  Location: [place] [facility];  Service: Cardiovascular;  Laterality: Left;  . PERIPHERAL VASCULAR CATHETERIZATION Right 03/31/2016   Procedure: Lower Extremity Angiography;  Surgeon: Corene Cornea  Bunnie Domino, MD;  Location: [place] [facility];  Service: Cardiovascular;  Laterality: Right;  . PERIPHERAL VASCULAR CATHETERIZATION  03/31/2016   Procedure: Lower Extremity Intervention;  Surgeon: Algernon Huxley, MD;  Location: [place] [facility];  Service: Cardiovascular;;  . WOUND DEBRIDEMENT Left 10/18/2015   Procedure:  DEBRIDEMENT WOUND   ( LEFT BKA DEBRIDEMENT );  Surgeon: Algernon Huxley, MD;  Location: [facility] ORS;  Service: Vascular;  Laterality: Left;    Prior to Admission medications   Medication Sig Start Date End Date Taking? Authorizing Provider  acetaminophen (TYLENOL) 325 MG tablet Take 650 mg by mouth at bedtime.     Historical Provider, MD  apixaban (ELIQUIS) 2.5 MG TABS tablet Take 2.5 mg by mouth 2 (two) times daily.    Historical Provider, MD  aspirin EC 81 MG EC tablet Take 1 tablet (81 mg total) by mouth daily. Patient not taking: Reported on 03/26/2016 09/24/15   Algernon Huxley, MD  atorvastatin (LIPITOR) 20 MG tablet Take 1 tablet (20 mg total) by mouth daily. 03/31/16   Algernon Huxley, MD  baclofen (LIORESAL) 10 MG tablet Take 5 mg by mouth 3 (three) times daily. Reported on 09/14/2015    Historical Provider, MD  doxycycline (VIBRA-TABS) 100 MG tablet Take 100 mg by mouth 2 (two) times daily.     Historical Provider, MD  ferrous sulfate 324 (65 Fe) MG TBEC Take 1 tablet by mouth 2 (two) times daily.    Historical Provider, MD  Fluticasone-Salmeterol (ADVAIR) 250-50 MCG/DOSE AEPB Inhale 1 puff into the lungs 2 (two) times daily.    Historical Provider, MD  furosemide (LASIX) 40 MG tablet Take 40 mg by mouth 2 (two) times daily.     Historical Provider, MD  gabapentin (NEURONTIN) 100 MG capsule Take 100 mg by mouth at bedtime.    Historical Provider, MD  metoprolol tartrate (LOPRESSOR) 25 MG tablet Take 0.5 tablets (12.5 mg total) by mouth 2 (two) times daily. 09/24/15   Algernon Huxley, MD  Omega-3 Fatty Acids (FISH OIL) 1000 MG CAPS Take 1,000 mg by mouth every morning.    Historical Provider, MD  oxyCODONE-acetaminophen (PERCOCET/ROXICET) 5-325 MG tablet Take 2 tablets by mouth every 4 (four) hours as needed for moderate pain. 09/24/15   Algernon Huxley, MD  oxyCODONE-acetaminophen (PERCOCET/ROXICET) 5-325 MG tablet Take 1-2 tablets by mouth every 4 (four) hours as needed for moderate pain. Patient taking differently:  Take 1 tablet by mouth 3 (three) times daily.  11/09/15   Kimberly A Stegmayer, PA-C  potassium chloride (K-DUR) 10 MEQ tablet Take 20 mEq by mouth daily.     Historical Provider, MD  sodium chloride (OCEAN) 0.65 % SOLN nasal spray Place 1 spray into both nostrils 4 (four) times daily as needed for congestion.     Historical Provider, MD  temazepam (RESTORIL) 15 MG capsule Take 15 mg by mouth at bedtime as needed for sleep.    Historical Provider, MD  tiotropium (SPIRIVA) 18 MCG inhalation capsule Place 18 mcg into inhaler and inhale daily.    Historical Provider, MD    Allergies Review of patient's allergies indicates no known allergies.  No family history on file.  Social History Social History  Substance Use Topics  . Smoking status: Former Smoker    Packs/day: 1.00    Years: 44.00    Types: Cigarettes    Quit date: 11/17/2012  . Smokeless tobacco: Never Used  . Alcohol use No    Review  of Systems Patient denies headaches, rhinorrhea, blurry vision, numbness, shortness of breath, chest pain, edema, cough, abdominal pain, nausea, vomiting, diarrhea, dysuria, fevers, rashes or hallucinations unless otherwise stated above in HPI. ____________________________________________   PHYSICAL EXAM:  VITAL SIGNS: Vitals:   04/03/16 1441  BP: 122/62  Pulse: 84  Resp: 18  Temp: 98.3 F (36.8 C)    Constitutional: Alert and oriented. Well appearing and in no acute distress. Eyes: Conjunctivae are normal. PERRL. EOMI. Head: Atraumatic. Nose: No congestion/rhinnorhea. Mouth/Throat: Mucous membranes are moist.  Oropharynx non-erythematous. Neck: No stridor. Painless ROM. No cervical spine tenderness to palpation Hematological/Lymphatic/Immunilogical: No cervical lymphadenopathy. Cardiovascular: Normal rate, regular rhythm. Grossly normal heart sounds.  Good peripheral circulation. Respiratory: Normal respiratory effort.  No retractions. Lungs CTAB. Gastrointestinal: Soft and  nontender. No distention. No abdominal bruits. No CVA tenderness.  Musculoskeletal: sp left aka,  Right le with 2+  Edema to above the knee.  Chronic venous stasis.  Chronic ischemic lesions to plantar aspect of foot, serous drainage.  No palpable pulses but biphasic PT signal on doppler and monophasic on Dp. cap  Neurologic:  Normal speech and language. No gross focal neurologic deficits are appreciated. No gait instability. Skin:  Skin is warm, dry and intact. No rash noted. Psychiatric: Mood and affect are normal. Speech and behavior are normal.  ____________________________________________   LABS (all labs ordered are listed, but only abnormal results are displayed)  Results for orders placed or performed during the hospital encounter of 04/03/16 (from the past 24 hour(s))  CBC with Differential/Platelet     Status: Abnormal   Collection Time: 04/03/16  4:08 PM  Result Value Ref Range   WBC 16.8 (H) 3.8 - 10.6 K/uL   RBC 4.46 4.40 - 5.90 MIL/uL   Hemoglobin 8.7 (L) 13.0 - 18.0 g/dL   HCT 27.6 (L) 40.0 - 52.0 %   MCV 61.8 (L) 80.0 - 100.0 fL   MCH 19.5 (L) 26.0 - 34.0 pg   MCHC 31.6 (L) 32.0 - 36.0 g/dL   RDW 20.9 (H) 11.5 - 14.5 %   Platelets 414 150 - 440 K/uL   Neutrophils Relative % 75 %   Neutro Abs 12.6 (H) 1.4 - 6.5 K/uL   Lymphocytes Relative 13 %   Lymphs Abs 2.1 1.0 - 3.6 K/uL   Monocytes Relative 9 %   Monocytes Absolute 1.5 (H) 0.2 - 1.0 K/uL   Eosinophils Relative 4 %   Eosinophils Absolute 0.6 0 - 0.7 K/uL   Basophils Relative 1 %   Basophils Absolute 0.1 0 - 0.1 K/uL   ____________________________________________   ____________________________________________  RADIOLOGY   ____________________________________________   PROCEDURES  Procedure(s) performed: none    Critical Care performed: no ____________________________________________   INITIAL IMPRESSION / ASSESSMENT AND PLAN / ED COURSE  Pertinent labs & imaging results that were available  during my care of the patient were reviewed by me and considered in my medical decision making (see chart for details).  DDX: DVT, hematoma, limits ischemia, cellulitis, abscess   Timothy Juarez is a 65 y.o. who presents to the ED with concern for worsening chronic foot ulcer several days status post femoral artery angiography and stenting. Patient is on Eliquis but does have worsening lower extremity edema. Will order ultrasound to evaluate for DVT. Patient does not have any crepitus or blistering of the lower extremity is afebrile without severe pain. I have a lower suspicion for necrotizing soft tissue infection based on this presentation. He does have  dopplerable pulses and brisk cap refill therefore a lower suspicion for acute ischemic limb particular status post stent. Based on presentation with this ulcer Will obtain wound culture as well as blood work in anticipation of starting antibiotics for infected ulcer.  Clinical Course  Comment By Time  I spoke with Dr. Lysbeth Galas regarding findings on ultrasound suggested the pseudoaneurysm of the left femoral artery status post AKA and left leg from March. Dr. Franchot Gallo  has recommended contacting Dr. Bunnie Domino office in the morning for same-day appointment to evaluate pseudoaneurysm.  Patient does have a leukocytosis which is mildly increased from previous. Based on these findings will start patient on Augmentin given the concern for infection. As the patient is otherwise afebrile and hemodynamically stable feel patient is appropriate for follow-up in clinic tomorrow.  The patient will be signed out to Dr. Joni Fears pending results of BMP. Anticipate discharge to [facility]. Merlyn Lot, MD 08/17 1705     ____________________________________________   FINAL CLINICAL IMPRESSION(S) / ED DIAGNOSES  Final diagnoses:  Peripheral arterial disease ([place])  Pseudoaneurysm of left femoral artery (HCC)  Chronic foot ulcer, right, with unspecified severity  ([place])      NEW MEDICATIONS STARTED DURING THIS VISIT:  New Prescriptions   No medications on file     Note:  This document was prepared using Dragon voice recognition software and may include unintentional dictation errors.    Merlyn Lot, MD 04/03/16 [phone]

## 2016-04-04 ENCOUNTER — Other Ambulatory Visit: Payer: Self-pay | Admitting: Vascular Surgery

## 2016-04-04 DIAGNOSIS — I724 Aneurysm of artery of lower extremity: Secondary | ICD-10-CM

## 2016-04-07 LAB — AEROBIC CULTURE W GRAM STAIN (SUPERFICIAL SPECIMEN)

## 2016-04-07 LAB — AEROBIC CULTURE  (SUPERFICIAL SPECIMEN)

## 2016-04-08 NOTE — Progress Notes (Signed)
65 yo male   Allergies:NKDA Visit Date: 04/03/17 CC: foot pain  Culture type: wound Culture results: staphylococcus aureus Original abx given: Augmentin Original abx sensitive, intermediate, resistant: sensitive Recommended abx: on appropriate therapy ED Physician: Contacted pt: Prescription called into:   Loree Fee, [facility] 1:45 PM 04/08/2016

## 2016-04-10 ENCOUNTER — Encounter
Admission: EM | Disposition: A | Discharge status: Home / Self Care | Payer: Self-pay | Source: Home / Self Care | Attending: Emergency Medicine

## 2016-04-10 ENCOUNTER — Encounter: Payer: Self-pay | Admitting: Emergency Medicine

## 2016-04-10 ENCOUNTER — Observation Stay
Admission: EM | Admit: 2016-04-10 | Discharge: 2016-04-11 | Disposition: A | Discharge status: Home / Self Care | Payer: Medicaid Other | Attending: Vascular Surgery | Admitting: Vascular Surgery

## 2016-04-10 ENCOUNTER — Emergency Department: Payer: Medicaid Other

## 2016-04-10 DIAGNOSIS — Z7982 Long term (current) use of aspirin: Secondary | ICD-10-CM | POA: Insufficient documentation

## 2016-04-10 DIAGNOSIS — J449 Chronic obstructive pulmonary disease, unspecified: Secondary | ICD-10-CM | POA: Diagnosis not present

## 2016-04-10 DIAGNOSIS — N189 Chronic kidney disease, unspecified: Secondary | ICD-10-CM | POA: Insufficient documentation

## 2016-04-10 DIAGNOSIS — Z9889 Other specified postprocedural states: Secondary | ICD-10-CM | POA: Diagnosis not present

## 2016-04-10 DIAGNOSIS — Z87891 Personal history of nicotine dependence: Secondary | ICD-10-CM | POA: Insufficient documentation

## 2016-04-10 DIAGNOSIS — I509 Heart failure, unspecified: Secondary | ICD-10-CM | POA: Diagnosis not present

## 2016-04-10 DIAGNOSIS — I724 Aneurysm of artery of lower extremity: Principal | ICD-10-CM | POA: Insufficient documentation

## 2016-04-10 DIAGNOSIS — E876 Hypokalemia: Secondary | ICD-10-CM | POA: Diagnosis not present

## 2016-04-10 DIAGNOSIS — L97519 Non-pressure chronic ulcer of other part of right foot with unspecified severity: Secondary | ICD-10-CM | POA: Diagnosis not present

## 2016-04-10 DIAGNOSIS — I7025 Atherosclerosis of native arteries of other extremities with ulceration: Secondary | ICD-10-CM | POA: Diagnosis not present

## 2016-04-10 DIAGNOSIS — I729 Aneurysm of unspecified site: Secondary | ICD-10-CM

## 2016-04-10 DIAGNOSIS — J9600 Acute respiratory failure, unspecified whether with hypoxia or hypercapnia: Secondary | ICD-10-CM | POA: Diagnosis not present

## 2016-04-10 DIAGNOSIS — Z89612 Acquired absence of left leg above knee: Secondary | ICD-10-CM | POA: Diagnosis not present

## 2016-04-10 DIAGNOSIS — L8990 Pressure ulcer of unspecified site, unspecified stage: Secondary | ICD-10-CM | POA: Insufficient documentation

## 2016-04-10 DIAGNOSIS — I130 Hypertensive heart and chronic kidney disease with heart failure and stage 1 through stage 4 chronic kidney disease, or unspecified chronic kidney disease: Secondary | ICD-10-CM | POA: Diagnosis not present

## 2016-04-10 DIAGNOSIS — Z89512 Acquired absence of left leg below knee: Secondary | ICD-10-CM | POA: Diagnosis not present

## 2016-04-10 DIAGNOSIS — I70229 Atherosclerosis of native arteries of extremities with rest pain, unspecified extremity: Secondary | ICD-10-CM | POA: Diagnosis present

## 2016-04-10 DIAGNOSIS — I739 Peripheral vascular disease, unspecified: Secondary | ICD-10-CM | POA: Diagnosis present

## 2016-04-10 HISTORY — PX: PERIPHERAL VASCULAR CATHETERIZATION: SHX172C

## 2016-04-10 LAB — COMPREHENSIVE METABOLIC PANEL
ALT: 22 U/L (ref 17–63)
ANION GAP: 6 (ref 5–15)
AST: 30 U/L (ref 15–41)
Albumin: 2.9 g/dL — ABNORMAL LOW (ref 3.5–5.0)
Alkaline Phosphatase: 100 U/L (ref 38–126)
BILIRUBIN TOTAL: 0.4 mg/dL (ref 0.3–1.2)
BUN: 15 mg/dL (ref 6–20)
CO2: 28 mmol/L (ref 22–32)
Calcium: 8.6 mg/dL — ABNORMAL LOW (ref 8.9–10.3)
Chloride: 103 mmol/L (ref 101–111)
Creatinine, Ser: 0.91 mg/dL (ref 0.61–1.24)
Glucose, Bld: 105 mg/dL — ABNORMAL HIGH (ref 65–99)
Potassium: 3.8 mmol/L (ref 3.5–5.1)
Sodium: 137 mmol/L (ref 135–145)
TOTAL PROTEIN: 7.7 g/dL (ref 6.5–8.1)

## 2016-04-10 LAB — CBC WITH DIFFERENTIAL/PLATELET
BASOS ABS: 0.2 10*3/uL — AB (ref 0–0.1)
Basophils Relative: 1 %
EOS PCT: 1 %
Eosinophils Absolute: 0.2 10*3/uL (ref 0–0.7)
HCT: 23.5 % — ABNORMAL LOW (ref 40.0–52.0)
HEMOGLOBIN: 7.5 g/dL — AB (ref 13.0–18.0)
LYMPHS PCT: 10 %
Lymphs Abs: 1.8 10*3/uL (ref 1.0–3.6)
MCH: 19.7 pg — ABNORMAL LOW (ref 26.0–34.0)
MCHC: 32 g/dL (ref 32.0–36.0)
MCV: 61.7 fL — AB (ref 80.0–100.0)
Monocytes Absolute: 1.2 10*3/uL — ABNORMAL HIGH (ref 0.2–1.0)
Monocytes Relative: 7 %
NEUTROS PCT: 81 %
Neutro Abs: 14.5 10*3/uL — ABNORMAL HIGH (ref 1.4–6.5)
PLATELETS: 601 10*3/uL — AB (ref 150–440)
RBC: 3.81 MIL/uL — AB (ref 4.40–5.90)
RDW: 20.6 % — ABNORMAL HIGH (ref 11.5–14.5)
WBC: 17.9 10*3/uL — AB (ref 3.8–10.6)

## 2016-04-10 LAB — TYPE AND SCREEN
ABO/RH(D): A POS
Antibody Screen: NEGATIVE

## 2016-04-10 LAB — PROTIME-INR
INR: 1.39
PROTHROMBIN TIME: 17.2 s — AB (ref 11.4–15.2)

## 2016-04-10 LAB — APTT: APTT: 41 s — AB (ref 24–36)

## 2016-04-10 SURGERY — LOWER EXTREMITY ANGIOGRAPHY
Anesthesia: Moderate Sedation | Laterality: Left

## 2016-04-10 MED ORDER — ACETAMINOPHEN 325 MG PO TABS: 325.0000 mg | ORAL_TABLET | ORAL | Status: DC | PRN

## 2016-04-10 MED ORDER — DIPHENHYDRAMINE HCL 50 MG/ML IJ SOLN
INTRAMUSCULAR | Status: AC
  Filled 2016-04-10: qty 1

## 2016-04-10 MED ORDER — MIDAZOLAM HCL 2 MG/2ML IJ SOLN
INTRAMUSCULAR | Status: DC | PRN
  Administered 2016-04-10 (×2): 2 mg via INTRAVENOUS

## 2016-04-10 MED ORDER — BACLOFEN 10 MG PO TABS
10.0000 mg | ORAL_TABLET | Freq: Three times a day (TID) | ORAL | Status: DC
  Administered 2016-04-10 – 2016-04-11 (×3): 10 mg via ORAL
  Filled 2016-04-10 (×3): qty 1

## 2016-04-10 MED ORDER — HEPARIN SODIUM (PORCINE) 1000 UNIT/ML IJ SOLN
INTRAMUSCULAR | Status: AC
  Filled 2016-04-10: qty 1

## 2016-04-10 MED ORDER — FERROUS SULFATE 325 (65 FE) MG PO TABS
325.0000 mg | ORAL_TABLET | Freq: Two times a day (BID) | ORAL | Status: DC
  Administered 2016-04-10 – 2016-04-11 (×2): 325 mg via ORAL
  Filled 2016-04-10 (×2): qty 1

## 2016-04-10 MED ORDER — IOPAMIDOL (ISOVUE-300) INJECTION 61%
INTRAVENOUS | Status: DC | PRN
  Administered 2016-04-10: 30 mL via INTRA_ARTERIAL

## 2016-04-10 MED ORDER — DIPHENHYDRAMINE HCL 50 MG/ML IJ SOLN
INTRAMUSCULAR | Status: DC | PRN
  Administered 2016-04-10: 50 mg via INTRAVENOUS

## 2016-04-10 MED ORDER — HYDROMORPHONE HCL 1 MG/ML IJ SOLN
INTRAMUSCULAR | Status: AC
  Administered 2016-04-10: 1 mg
  Filled 2016-04-10: qty 1

## 2016-04-10 MED ORDER — ACETAMINOPHEN 325 MG RE SUPP
325.0000 mg | RECTAL | Status: DC | PRN
  Filled 2016-04-10: qty 2

## 2016-04-10 MED ORDER — HYDROMORPHONE HCL 1 MG/ML IJ SOLN: 1.0000 mg | Freq: Once | INTRAMUSCULAR | Status: DC

## 2016-04-10 MED ORDER — PANTOPRAZOLE SODIUM 40 MG PO TBEC
40.0000 mg | DELAYED_RELEASE_TABLET | Freq: Every day | ORAL | Status: DC
  Administered 2016-04-10 – 2016-04-11 (×2): 40 mg via ORAL
  Filled 2016-04-10 (×2): qty 1

## 2016-04-10 MED ORDER — HEPARIN (PORCINE) IN NACL 2-0.9 UNIT/ML-% IJ SOLN
INTRAMUSCULAR | Status: AC
  Filled 2016-04-10: qty 1000

## 2016-04-10 MED ORDER — HYDROMORPHONE HCL 1 MG/ML IJ SOLN
1.0000 mg | Freq: Once | INTRAMUSCULAR | Status: AC
  Administered 2016-04-10: 1 mg via INTRAVENOUS
  Filled 2016-04-10: qty 1

## 2016-04-10 MED ORDER — ASPIRIN EC 81 MG PO TBEC
81.0000 mg | DELAYED_RELEASE_TABLET | Freq: Every day | ORAL | Status: DC
  Administered 2016-04-10 – 2016-04-11 (×2): 81 mg via ORAL
  Filled 2016-04-10 (×2): qty 1

## 2016-04-10 MED ORDER — ONDANSETRON HCL 4 MG/2ML IJ SOLN: 4.0000 mg | Freq: Four times a day (QID) | INTRAMUSCULAR | Status: DC | PRN

## 2016-04-10 MED ORDER — APIXABAN 2.5 MG PO TABS: 2.5000 mg | ORAL_TABLET | Freq: Two times a day (BID) | ORAL | Status: DC

## 2016-04-10 MED ORDER — LABETALOL HCL 5 MG/ML IV SOLN
10.0000 mg | INTRAVENOUS | Status: DC | PRN
Start: 2016-04-10 — End: 2016-04-11

## 2016-04-10 MED ORDER — MOMETASONE FURO-FORMOTEROL FUM 200-5 MCG/ACT IN AERO
2.0000 | INHALATION_SPRAY | Freq: Two times a day (BID) | RESPIRATORY_TRACT | Status: DC
  Administered 2016-04-10 – 2016-04-11 (×2): 2 via RESPIRATORY_TRACT
  Filled 2016-04-10: qty 8.8

## 2016-04-10 MED ORDER — TIOTROPIUM BROMIDE MONOHYDRATE 18 MCG IN CAPS
18.0000 ug | ORAL_CAPSULE | Freq: Every day | RESPIRATORY_TRACT | Status: DC
  Administered 2016-04-11: 18 ug via RESPIRATORY_TRACT
  Filled 2016-04-10: qty 5

## 2016-04-10 MED ORDER — FUROSEMIDE 40 MG PO TABS
40.0000 mg | ORAL_TABLET | Freq: Two times a day (BID) | ORAL | Status: DC
  Administered 2016-04-10 – 2016-04-11 (×2): 40 mg via ORAL
  Filled 2016-04-10 (×2): qty 1

## 2016-04-10 MED ORDER — SODIUM CHLORIDE 0.9 % IV SOLN: INTRAVENOUS | Status: DC

## 2016-04-10 MED ORDER — ATORVASTATIN CALCIUM 20 MG PO TABS
20.0000 mg | ORAL_TABLET | Freq: Every day | ORAL | Status: DC
  Administered 2016-04-10 – 2016-04-11 (×2): 20 mg via ORAL
  Filled 2016-04-10 (×2): qty 1

## 2016-04-10 MED ORDER — IOPAMIDOL (ISOVUE-370) INJECTION 76%
100.0000 mL | Freq: Once | INTRAVENOUS | Status: AC | PRN
  Administered 2016-04-10: 100 mL via INTRAVENOUS

## 2016-04-10 MED ORDER — HYDRALAZINE HCL 20 MG/ML IJ SOLN
5.0000 mg | INTRAMUSCULAR | Status: DC | PRN
Start: 2016-04-10 — End: 2016-04-11

## 2016-04-10 MED ORDER — MIDAZOLAM HCL 5 MG/5ML IJ SOLN
INTRAMUSCULAR | Status: AC
  Filled 2016-04-10: qty 5

## 2016-04-10 MED ORDER — TEMAZEPAM 15 MG PO CAPS: 15.0000 mg | ORAL_CAPSULE | Freq: Every evening | ORAL | Status: DC | PRN

## 2016-04-10 MED ORDER — POTASSIUM CHLORIDE CRYS ER 20 MEQ PO TBCR
20.0000 meq | EXTENDED_RELEASE_TABLET | Freq: Every day | ORAL | Status: DC
  Administered 2016-04-11: 20 meq via ORAL
  Filled 2016-04-10: qty 2
  Filled 2016-04-10: qty 1

## 2016-04-10 MED ORDER — MORPHINE SULFATE (PF) 2 MG/ML IV SOLN
2.0000 mg | INTRAVENOUS | Status: DC | PRN
  Administered 2016-04-11: 2 mg via INTRAVENOUS
  Filled 2016-04-10: qty 1

## 2016-04-10 MED ORDER — METOPROLOL TARTRATE 25 MG PO TABS
12.5000 mg | ORAL_TABLET | Freq: Two times a day (BID) | ORAL | Status: DC
  Administered 2016-04-11: 12.5 mg via ORAL
  Filled 2016-04-10 (×3): qty 1

## 2016-04-10 MED ORDER — METOPROLOL TARTRATE 5 MG/5ML IV SOLN: 2.0000 mg | INTRAVENOUS | Status: DC | PRN

## 2016-04-10 MED ORDER — GUAIFENESIN-DM 100-10 MG/5ML PO SYRP
15.0000 mL | ORAL_SOLUTION | ORAL | Status: DC | PRN
Start: 2016-04-10 — End: 2016-04-11

## 2016-04-10 MED ORDER — OXYCODONE HCL 5 MG PO TABS
5.0000 mg | ORAL_TABLET | ORAL | Status: DC | PRN
  Administered 2016-04-10 – 2016-04-11 (×3): 10 mg via ORAL
  Filled 2016-04-10 (×3): qty 2

## 2016-04-10 MED ORDER — ALUM & MAG HYDROXIDE-SIMETH 200-200-20 MG/5ML PO SUSP: 15.0000 mL | ORAL | Status: DC | PRN

## 2016-04-10 MED ORDER — LIDOCAINE-EPINEPHRINE (PF) 1 %-1:200000 IJ SOLN
INTRAMUSCULAR | Status: AC
  Filled 2016-04-10: qty 30

## 2016-04-10 MED ORDER — FENTANYL CITRATE (PF) 100 MCG/2ML IJ SOLN
INTRAMUSCULAR | Status: AC
  Filled 2016-04-10: qty 2

## 2016-04-10 MED ORDER — PHENOL 1.4 % MT LIQD
1.0000 | OROMUCOSAL | Status: DC | PRN
  Filled 2016-04-10: qty 177

## 2016-04-10 MED ORDER — FENTANYL CITRATE (PF) 100 MCG/2ML IJ SOLN
INTRAMUSCULAR | Status: DC | PRN
  Administered 2016-04-10 (×2): 50 ug via INTRAVENOUS

## 2016-04-10 MED ORDER — GABAPENTIN 100 MG PO CAPS
100.0000 mg | ORAL_CAPSULE | Freq: Every day | ORAL | Status: DC
  Administered 2016-04-10: 100 mg via ORAL
  Filled 2016-04-10: qty 1

## 2016-04-10 SURGICAL SUPPLY — 15 items
CATH PIG 70CM (CATHETERS) ×3 IMPLANT
CATH RIM 65CM (CATHETERS) ×2 IMPLANT
DEVICE PRESTO INFLATION (MISCELLANEOUS) ×2 IMPLANT
DEVICE STARCLOSE SE CLOSURE (Vascular Products) ×2 IMPLANT
GLIDEWIRE ADV .035X260CM (WIRE) ×2 IMPLANT
GUIDEWIRE SUPER STIFF .035X180 (WIRE) ×2 IMPLANT
PACK ANGIOGRAPHY (CUSTOM PROCEDURE TRAY) ×3 IMPLANT
SHEATH ANL2 6FRX45 HC (SHEATH) ×2 IMPLANT
SHEATH BRITE TIP 4FRX11 (SHEATH) ×2 IMPLANT
SHEATH BRITE TIP 5FRX11 (SHEATH) ×3 IMPLANT
STENT VIABAHN 5X25X120 (Permanent Stent) ×2 IMPLANT
SYR MEDRAD MARK V 150ML (SYRINGE) ×3 IMPLANT
TUBING CONTRAST HIGH PRESS 72 (TUBING) ×3 IMPLANT
WIRE G V18X300CM (WIRE) ×2 IMPLANT
WIRE J 3MM .035X145CM (WIRE) ×3 IMPLANT

## 2016-04-10 NOTE — ED Notes (Signed)
Pt to specials at this time

## 2016-04-10 NOTE — H&P (Signed)
  [place] [facility] History & Physical Update  The patient was interviewed and re-examined.  The patient's previous PA History and Physical has been reviewed and is unchanged.  There is no change in the plan of care. We plan to proceed with the scheduled procedure to treat his left leg pseudoaneurysm.  Kimoni Pagliarulo, MD  04/10/2016, 4:13 PM

## 2016-04-10 NOTE — Op Note (Signed)
[facility] Percutaneous Study/Intervention Procedural Note   Date of Surgery: 04/10/2016  Surgeon(s):DEW,JASON   Assistants:none  Pre-operative Diagnosis: Pseudoaneurysm left femoral artery, left leg amputation and PAD  Post-operative diagnosis: Same  Procedure(s) Performed: 1. Ultrasound guidance for vascular access right femoral artery 2. Catheter placement into left profunda femorus artery from right femoral approach 3. Selective left lower extremity angiogram 4. Viabahn covered stent placement to the left profunda femorus artery with 5 mm x 25 mm length covered stent 5. StarClose closure device right femoral artery  EBL: 15 cc  Contrast: 30 cc  Fluoro Time: 2.4 minutes  Moderate Conscious Sedation Time: approximately 25 minutes using 4 mg of Versed and 100 mcg of Fentanyl  Indications: Patient is a 65 y.o.male with an expanding pseudoaneurysm of the left femoral artery. The patient has a CT study showing the pseudoaneurysm, but the origin was not able to be determined. The patient is brought in for angiography for further evaluation and potential treatment. Risks and benefits are discussed and informed consent is obtained  Procedure: The patient was identified and appropriate procedural time out was performed. The patient was then placed supine on the table and prepped and draped in the usual sterile fashion.Moderate conscious sedation was administered during a face to face encounter with the patient throughout the procedure with my supervision of the RN administering medicines and monitoring the patient's vital signs, pulse oximetry, telemetry and mental status throughout from the start of the procedure until the patient was taken to the recovery room. Ultrasound was used to evaluate the right common femoral artery. It was patent . A digital ultrasound image was  acquired. A Seldinger needle was used to access the right common femoral artery under direct ultrasound guidance and a permanent image was performed. A 0.035 J wire was advanced without resistance and a 5Fr sheath was placed. I then crossed the aortic bifurcation and advanced to the left femoral head. Selective left lower extremity angiogram was then performed. This demonstrated pseudoaneurysm seen on the proximal profunda femorus artery with high grade stenosis just distal to the pseudoaneurysm.  SFA was occluded. The patient was lightly heparinized and a 6 [place] [employer] sheath was then placed over the [employer] wire. I then used a Kumpe catheter and the advantage wire to navigate across the pseudoaneurysm stenosis in the profunda femoris artery and confirm intraluminal flow in the secondary profunda femoris branches. I then exchanged for a 0.018 wire. A 5 mm diameter by 25 cm length covered stent was used to treat both the stenosis of the profunda femoris artery as well as the site of active extravasation. This was deployed in the proximal profunda femoris artery and postdilated with a 5 mm balloon with excellent angiographic completion result and less than 20% residual stenosis and no residual blush or sign of pseudoaneurysm. I elected to terminate the procedure. The sheath was removed and StarClose closure device was deployed in the right femoral artery with excellent hemostatic result. The patient was taken to the recovery room in stable condition having tolerated the procedure well.  Findings:   Left Lower Extremity: pseudoaneurysm seen on the proximal profunda femorus artery with high grade stenosis just distal to the pseudoaneurysm.  SFA was occluded.   Disposition: Patient was taken to the recovery room in stable condition having tolerated the procedure well.  Complications: None  DEW,JASON 04/10/2016 4:51 PM

## 2016-04-10 NOTE — H&P (Signed)
[facility] Admission History & Physical  MRN : [id number]  Timothy Juarez is a 65 y.o. (16-Feb-1951) male who presents with chief complaint of  Chief Complaint  Patient presents with  . Groin Pain   History of Present Illness:  Timothy Juarez is a 65 year old male well known to our practice with a past medical history of  peripheral vascular disease, chronic kidney disease, HTN, COPD and congestive heart failure. He presents to the [facility] ED s/p ultrasound guidance for vascular access left femoral artery, catheter placement into right anterior tibial artery from left femoral approach, aortogram and selective right lower extremity angiogram, percutaneous transluminal angioplasty of the right anterior tibial artery with a 2 mm diameter angioplasty balloon distally and a 3 mm diameter angioplasty balloon proximally, percutaneous transluminal angioplasty of the right popliteal artery with 5 mm diameter Lutonix drug-coated angioplasty balloon, mid and distal SFA with 5 mm diameter angioplasty balloon, and proximal SFA with 6 mm diameter Lutonix drug-coated angioplasty balloon, viabahn covered stent placement to the right SFA for residual stenosis after angioplasty using a 6 mm diameter by 25 cm length stent, percutaneous transluminal angioplasty of the right external iliac artery with 7 mm diameter by 6 cm length Lutonix drug-coated angioplasty balloon with StarClose closure device left femoral artery on 03/31/16 for peripheral artery disease with right lower extremity foot ulceration.   Patient presents to the [facility] ED with a chief complaint of "right lower extremity pain". Patient endorses a history of progressively worsening right lower extremity pain since his procedure on 03/31/16. He was seen in our ER on 04/03/16 found to have a pseudoaneurysm of the left femoral artery on ultrasound. Patient was scheduled to follow up with Dr. Lucky Cowboy tomorrow in our clinic for further evaluation.  He presents today with worsening pain located predominately in his left groin. Pain does not radiate. No alleviating factors. Activity worsens the pain. The patient pain has significantly worsened prompting him to seek medical attention in the ED. Patient denies any fever, nausea, vomiting, rest pain or ulceration to his right lower extremity.   CT scan completed today notable for "large hematoma is noted in left inguinal region measuring 21 x 9 x 8cm consistent with pseudoaneurysm mention the patient's history. Active extravasation of contrast into the pseudoaneurysm is noted."  Patient being admitted for pseudoaneurysm repair and possible hematoma evacuation.   Current Facility-Administered Medications  Medication Dose Route Frequency Provider Last Rate Last Dose  . acetaminophen (TYLENOL) tablet 325-650 mg  325-650 mg Oral Q4H PRN Janalyn Harder Ashwin Tibbs, PA-C       Or  . acetaminophen (TYLENOL) suppository 325-650 mg  325-650 mg Rectal Q4H PRN Janalyn Harder Galen Malkowski, PA-C      . alum & mag hydroxide-simeth (MAALOX/MYLANTA) 200-200-20 MG/5ML suspension 15-30 mL  15-30 mL Oral Q2H PRN Tykira Wachs A Nivin Braniff, PA-C      . apixaban (ELIQUIS) tablet 2.5 mg  2.5 mg Oral BID Astin Sayre A Aminah Zabawa, PA-C      . aspirin EC tablet 81 mg  81 mg Oral Daily Graciela Plato A Domnick Chervenak, PA-C      . atorvastatin (LIPITOR) tablet 20 mg  20 mg Oral Daily Shalee Paolo A Glenette Bookwalter, PA-C      . baclofen (LIORESAL) tablet 10 mg  10 mg Oral TID Janalyn Harder Siler Mavis, PA-C      . ferrous sulfate TBEC 325 mg  1 tablet Oral BID Tanay Misuraca A Ethelene Closser, PA-C      . furosemide (LASIX) tablet  40 mg  40 mg Oral BID Ardyce Heyer A Devan Danzer, PA-C      . gabapentin (NEURONTIN) capsule 100 mg  100 mg Oral QHS Clementina Mareno A Bibiana Gillean, PA-C      . guaiFENesin-dextromethorphan (ROBITUSSIN DM) 100-10 MG/5ML syrup 15 mL  15 mL Oral Q4H PRN Aidenjames Heckmann A Catricia Scheerer, PA-C      . hydrALAZINE (APRESOLINE) injection 5 mg  5 mg Intravenous Q20 Min PRN Janalyn Harder  Afrah Burlison, PA-C      . HYDROmorphone (DILAUDID) injection 1 mg  1 mg Intravenous Once Schuyler Amor, MD      . labetalol (NORMODYNE,TRANDATE) injection 10 mg  10 mg Intravenous Q10 min PRN Janalyn Harder Trenden Hazelrigg, PA-C      . metoprolol (LOPRESSOR) injection 2-5 mg  2-5 mg Intravenous Q2H PRN Junior Kenedy A Jelisha Weed, PA-C      . metoprolol tartrate (LOPRESSOR) tablet 12.5 mg  12.5 mg Oral BID Kastiel Simonian A Lilymae Swiech, PA-C      . mometasone-formoterol (DULERA) 200-5 MCG/ACT inhaler 2 puff  2 puff Inhalation BID Stanislav Gervase A Tiffiany Beadles, PA-C      . morphine 2 MG/ML injection 2 mg  2 mg Intravenous Q3H PRN Meshilem Machuca A Renesha Lizama, PA-C      . ondansetron (ZOFRAN) injection 4 mg  4 mg Intravenous Q6H PRN Jahni Paul A Macoy Rodwell, PA-C      . oxyCODONE (Oxy IR/ROXICODONE) immediate release tablet 5-10 mg  5-10 mg Oral Q4H PRN Sharlene Mccluskey A Tyrease Vandeberg, PA-C      . pantoprazole (PROTONIX) EC tablet 40 mg  40 mg Oral Daily Markesha Hannig A Malisha Mabey, PA-C      . phenol (CHLORASEPTIC) mouth spray 1 spray  1 spray Mouth/Throat PRN Kristl Morioka A Jacora Hopkins, PA-C      . potassium chloride (K-DUR) CR tablet 20 mEq  20 mEq Oral Daily Jacia Sickman A Rudine Rieger, PA-C      . temazepam (RESTORIL) capsule 15 mg  15 mg Oral QHS PRN Shaakira Borrero A Acen Craun, PA-C      . tiotropium (SPIRIVA) inhalation capsule 18 mcg  18 mcg Inhalation Daily Sela Hua, PA-C       Current Outpatient Prescriptions  Medication Sig Dispense Refill  . acetaminophen (TYLENOL) 325 MG tablet Take 650 mg by mouth at bedtime.     [name] amoxicillin-clavulanate (AUGMENTIN) 875-125 MG tablet Take 1 tablet by mouth 2 (two) times daily.    [name] apixaban (ELIQUIS) 2.5 MG TABS tablet Take 2.5 mg by mouth 2 (two) times daily.    [name] atorvastatin (LIPITOR) 20 MG tablet Take 1 tablet (20 mg total) by mouth daily. 30 tablet 3  . baclofen (LIORESAL) 10 MG tablet Take 10 mg by mouth 3 (three) times daily. Hold for sedation    . ferrous sulfate 324 (65 Fe) MG TBEC Take 1 tablet by mouth 2  (two) times daily.    . Fluticasone-Salmeterol (ADVAIR) 250-50 MCG/DOSE AEPB Inhale 1 puff into the lungs 2 (two) times daily.    . furosemide (LASIX) 40 MG tablet Take 40 mg by mouth 2 (two) times daily.     [name] gabapentin (NEURONTIN) 100 MG capsule Take 100 mg by mouth at bedtime.    . metoprolol tartrate (LOPRESSOR) 25 MG tablet Take 0.5 tablets (12.5 mg total) by mouth 2 (two) times daily. 60 tablet 5  . Omega-3 Fatty Acids (FISH OIL) 1000 MG CAPS Take 1,000 mg by mouth every morning.    [name] oxyCODONE (ROXICODONE) 15 MG immediate release tablet Take 15 mg by mouth 3 (three) times daily.    [name]  oxyCODONE-acetaminophen (PERCOCET/ROXICET) 5-325 MG tablet Take 1 tablet by mouth 2 (two) times daily as needed for severe pain.    . potassium chloride (K-DUR) 10 MEQ tablet Take 20 mEq by mouth daily.     . sodium chloride (OCEAN) 0.65 % SOLN nasal spray Place 1 spray into both nostrils 4 (four) times daily as needed for congestion.     . temazepam (RESTORIL) 15 MG capsule Take 15 mg by mouth at bedtime as needed for sleep.    [name] tiotropium (SPIRIVA) 18 MCG inhalation capsule Place 18 mcg into inhaler and inhale daily.    [name] aspirin EC 81 MG EC tablet Take 1 tablet (81 mg total) by mouth daily. (Patient not taking: Reported on 03/26/2016) 30 tablet 11  . doxycycline (VIBRA-TABS) 100 MG tablet Take 100 mg by mouth 2 (two) times daily.      Past Medical History:  Diagnosis Date  . ARF (acute respiratory failure) (HCC)    H/O  . CHF (congestive heart failure) ([place])   . COPD (chronic obstructive pulmonary disease) ([place])   . Hypertension   . Hypokalemia   . Insomnia   . Muscle contracture    MUSCLE SPASMS  . Peripheral vascular disease ([place])   . Pressure ulcer    Past Surgical History:  Procedure Laterality Date  . AMPUTATION Left 09/19/2015   Procedure: AMPUTATION BELOW KNEE;  Surgeon: Algernon Huxley, MD;  Location: [facility];  Service: Vascular;  Laterality: Left;  . AMPUTATION Left 11/01/2015   Procedure:  AMPUTATION ABOVE KNEE;  Surgeon: Algernon Huxley, MD;  Location: [facility];  Service: Vascular;  Laterality: Left;  . APPLICATION OF WOUND VAC Left 10/18/2015   Procedure: APPLICATION OF WOUND VAC;  Surgeon: Algernon Huxley, MD;  Location: [facility];  Service: Vascular;  Laterality: Left;  . PERIPHERAL VASCULAR CATHETERIZATION Left 01/18/2015   Procedure: Lower Extremity Angiography;  Surgeon: Algernon Huxley, MD;  Location: [place] [facility];  Service: Cardiovascular;  Laterality: Left;  . PERIPHERAL VASCULAR CATHETERIZATION N/A 01/18/2015   Procedure: Lower Extremity Intervention;  Surgeon: Algernon Huxley, MD;  Location: [place] [facility];  Service: Cardiovascular;  Laterality: N/A;  . PERIPHERAL VASCULAR CATHETERIZATION  07/30/2015   Procedure: Lower Extremity Intervention;  Surgeon: Algernon Huxley, MD;  Location: [place] [facility];  Service: Cardiovascular;;  . PERIPHERAL VASCULAR CATHETERIZATION N/A 07/30/2015   Procedure: Abdominal Aortogram w/Lower Extremity;  Surgeon: Algernon Huxley, MD;  Location: [place] [facility];  Service: Cardiovascular;  Laterality: N/A;  . PERIPHERAL VASCULAR CATHETERIZATION Left 08/22/2015   Procedure: Lower Extremity Angiography;  Surgeon: Algernon Huxley, MD;  Location: [place] [facility];  Service: Cardiovascular;  Laterality: Left;  . PERIPHERAL VASCULAR CATHETERIZATION Left 08/22/2015   Procedure: Lower Extremity Intervention;  Surgeon: Algernon Huxley, MD;  Location: [place] [facility];  Service: Cardiovascular;  Laterality: Left;  . PERIPHERAL VASCULAR CATHETERIZATION Right 03/31/2016   Procedure: Lower Extremity Angiography;  Surgeon: Algernon Huxley, MD;  Location: [place] [facility];  Service: Cardiovascular;  Laterality: Right;  . PERIPHERAL VASCULAR CATHETERIZATION  03/31/2016   Procedure: Lower Extremity Intervention;  Surgeon: Algernon Huxley, MD;  Location: [place] [facility];  Service: Cardiovascular;;  . WOUND DEBRIDEMENT Left 10/18/2015   Procedure: DEBRIDEMENT WOUND   ( LEFT  BKA DEBRIDEMENT );  Surgeon: Algernon Huxley, MD;  Location: [facility];  Service: Vascular;  Laterality: Left;   Social History Social History  Substance Use Topics  .  Smoking status: Former Smoker    Packs/day: 1.00    Years: 44.00    Types: Cigarettes    Quit date: 11/17/2012  . Smokeless tobacco: Never Used  . Alcohol use No   Family History Denies family history of bleeding / clotting disorders, PAD or renal disease.   No Known Allergies  REVIEW OF SYSTEMS (Negative unless checked)  Constitutional: [] Weight loss  [] Fever  [] Chills Cardiac: [] Chest pain   [] Chest pressure   [] Palpitations   [] Shortness of breath when laying flat   [] Shortness of breath at rest   [x] Shortness of breath with exertion. Vascular:  [x] Pain in legs with walking   [x] Pain in legs at rest   [x] Pain in legs when laying flat   [] Claudication   [x] Pain in feet when walking  [x] Pain in feet at rest  [x] Pain in feet when laying flat   [] History of DVT   [] Phlebitis   [x] Swelling in legs   [] Varicose veins   [x] Non-healing ulcers Pulmonary:   [] Uses home oxygen   [] Productive cough   [] Hemoptysis   [] Wheeze  [] COPD   [] Asthma Neurologic:  [] Dizziness  [] Blackouts   [] Seizures   [] History of stroke   [] History of TIA  [] Aphasia   [] Temporary blindness   [] Dysphagia   [] Weakness or numbness in arms   [] Weakness or numbness in legs Musculoskeletal:  [] Arthritis   [] Joint swelling   [] Joint pain   [] Low back pain Hematologic:  [] Easy bruising  [] Easy bleeding   [] Hypercoagulable state   [] Anemic  [] Hepatitis Gastrointestinal:  [] Blood in stool   [] Vomiting blood  [] Gastroesophageal reflux/heartburn   [] Difficulty swallowing. Genitourinary:  [x] Chronic kidney disease   [] Difficult urination  [] Frequent urination  [] Burning with urination   [] Blood in urine Skin:  [] Rashes   [] Ulcers   [] Wounds Psychological:  [] History of anxiety   []  History of major depression.  Physical Examination  Vitals:   04/10/16 1140 04/10/16  1141 04/10/16 1200  BP: 118/70  113/65  Pulse: 92  88  Resp: 20    Temp: 98.2 F (36.8 C)    TempSrc: Oral    SpO2: 100%  100%  Weight:  81.6 kg (180 lb)   Height:  6\' 1"  (1.854 m)    Body mass index is 23.75 kg/m. Gen: WD/WN, NAD Head: [place]/AT, No temporalis wasting.  Ear/Nose/Throat: Hearing grossly intact, nares w/o erythema or drainage, oropharynx w/o Erythema/Exudate, Eyes: PERRLA, EOMI.  Neck: Supple, no nuchal rigidity.  No bruit or JVD.  Pulmonary:  Good air movement, clear to auscultation bilaterally, no increased work of respiration or use of accessory muscles  Cardiac: RRR, normal S1, S2, no Murmurs, rubs or gallops. Vascular:  Vessel Right Left  Radial Palpable Palpable  Ulnar Palpable Palpable  Brachial Palpable Palpable  Carotid Palpable, without bruit Palpable, without bruit  Aorta Not palpable N/A  Femoral Palpable Palpable  Popliteal Palpable Palpable  PT Palpable Palpable  DP Palpable Palpable   Left Lower Extremity: No ulcer formation. AKA - stump healthy.   Left Groin: Palpable pulse noted in left groin. Tender to palpation. Swelling noted. No  drainage.   Gastrointestinal: soft, non-tender/non-distended. No guarding/reflex. No masses, surgical incisions, or scars. Neurologic: CN 2-12 intact. Pain and light touch intact in extremities.  Symmetrical.  Speech is fluent. Motor exam as listed above. Psychiatric: Judgment intact, Mood & affect appropriate for pt's clinical situation. Lymph : No Cervical, Axillary, or Inguinal lymphadenopathy.  CBC Lab Results  Component Value Date   WBC  17.9 (H) 04/10/2016   HGB 7.5 (L) 04/10/2016   HCT 23.5 (L) 04/10/2016   MCV 61.7 (L) 04/10/2016   PLT 601 (H) 04/10/2016   BMET    Component Value Date/Time   NA 137 04/10/2016 1219   NA 136 12/01/2013 1317   K 3.8 04/10/2016 1219   K 3.9 12/01/2013 1317   CL 103 04/10/2016 1219   CL 100 12/01/2013 1317   CO2 28 04/10/2016 1219   CO2 33 (H) 12/01/2013 1317    GLUCOSE 105 (H) 04/10/2016 1219   GLUCOSE 91 12/01/2013 1317   BUN 15 04/10/2016 1219   BUN 24 (H) 09/25/2014 0846   CREATININE 0.91 04/10/2016 1219   CREATININE 1.29 09/25/2014 0846   CALCIUM 8.6 (L) 04/10/2016 1219   CALCIUM 9.4 12/01/2013 1317   GFRNONAA >60 04/10/2016 1219   GFRNONAA 60 (L) 09/25/2014 0846   GFRNONAA >60 03/16/2014 0750   GFRAA >60 04/10/2016 1219   GFRAA >60 09/25/2014 0846   GFRAA >60 03/16/2014 0750   Estimated Creatinine Clearance: 91.5 mL/min (by C-G formula based on SCr of 0.91 mg/dL).  COAG Lab Results  Component Value Date   INR 1.39 04/10/2016   INR 1.20 10/30/2015   INR 1.17 10/18/2015    Radiology US Venous Img Lower Unilateral Right  Result Date: 04/03/2016 CLINICAL DATA:  Right leg pain and swelling, peripheral vascular disease, recent right lower extremity vascular prevention via of left femoral access EXAM: RIGHT LOWER EXTREMITY VENOUS DOPPLER ULTRASOUND TECHNIQUE: Gray-scale sonography with graded compression, as well as color Doppler and duplex ultrasound were performed to evaluate the lower extremity deep venous systems from the level of the common femoral vein and including the common femoral, femoral, profunda femoral, popliteal and calf veins including the posterior tibial, peroneal and gastrocnemius veins when visible. The superficial great saphenous vein was also interrogated. Spectral Doppler was utilized to evaluate flow at rest and with distal augmentation maneuvers in the common femoral, femoral and popliteal veins. COMPARISON:  None. FINDINGS: Contralateral Common Femoral Vein: Respiratory phasicity is normal and symmetric with the symptomatic side. No evidence of thrombus. Normal compressibility. Common Femoral Vein: No evidence of thrombus. Normal compressibility, respiratory phasicity and response to augmentation. Saphenofemoral Junction: No evidence of thrombus. Normal compressibility and flow on color Doppler imaging. Profunda Femoral  Vein: No evidence of thrombus. Normal compressibility and flow on color Doppler imaging. Femoral Vein: No evidence of thrombus. Normal compressibility, respiratory phasicity and response to augmentation. Popliteal Vein: No evidence of thrombus. Normal compressibility, respiratory phasicity and response to augmentation. Calf Veins: No evidence of thrombus. Normal compressibility and flow on color Doppler imaging. Superficial Great Saphenous Vein: No evidence of thrombus. Normal compressibility and flow on color Doppler imaging. Venous Reflux:  None. Other Findings: In the left inguinal region, there is a large complex vascular abnormality with residual internal flow and partial thrombosis compatible with a femoral pseudoaneurysm. There is surrounding hematoma throughout the left inguinal region. The entire hematoma/partially thrombosed pseudoaneurysm complex measures 15 x 5.3 x 6.8 cm by ultrasound. IMPRESSION: Left inguinal/femoral partially thrombosed pseudoaneurysm/hematoma measuring 15 x 5.3 x 6.8 cm. Negative for right lower extremity DVT. These results will be called to the ordering clinician or representative by the Radiology Department at the imaging location. Electronically Signed   By: Jerilynn Mages.  Shick M.D.   On: 04/03/2016 16:38   Ct Angio Abd/pel W And/or Wo Contrast  Result Date: 04/10/2016 CLINICAL DATA:  Pain secondary to left inguinal pseudoaneurysm. EXAM: CTA ABDOMEN AND PELVIS  wITHOUT AND WITH CONTRAST TECHNIQUE: Multidetector CT imaging of the abdomen and pelvis was performed using the standard protocol during bolus administration of intravenous contrast. Multiplanar reconstructed images and MIPs were obtained and reviewed to evaluate the vascular anatomy. CONTRAST:  100 mL of Isovue 370 intravenously. COMPARISON:  None. FINDINGS: VASCULAR Aorta: Atherosclerosis is noted without aneurysm or dissection. Celiac: Without significant stenosis. SMA: Mild calcified plaque is noted without significant  stenosis. Renals: Without significant stenosis. IMA: Patent. Inflow: Atherosclerosis of iliac arteries is noted with moderate focal stenosis seen in proximal right external iliac artery secondary to calcified plaque. Proximal Outflow: Patent stent is seen in left external iliac artery. Large hematoma is noted in left inguinal region measuring 21 x 9 x 8 cm. This is consistent with due to aneurysm as mentioned and patient's history. There appears to be occlusion of the proximal superficial femoral artery. Active extravasation of contrast into the pseudoaneurysm is noted. Veins: Portal, splenic and superior mesenteric veins are patent. Review of the MIP images confirms the above findings. NON-VASCULAR Lower chest: No abnormality seen. Hepatobiliary: No gallstones are noted.  Liver appears normal. Pancreas: Normal. Spleen: Normal. Adrenals/Urinary Tract: Adrenal glands and kidneys appear normal. Stomach/Bowel: There is no evidence of bowel obstruction. The appendix appears normal. Lymphatic: No significant adenopathy is noted. Reproductive: Prostate appears normal. 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 Other: Urinary bladder appears normal. IMPRESSION: Vascular Atherosclerosis of abdominal aorta without aneurysm or dissection. No significant mesenteric or renal artery stenosis is noted. Atherosclerosis of iliac arteries is noted, with moderate focal stenosis seen in proximal right external iliac artery and patent stent seen in left external iliac artery. Large hematoma is noted in left inguinal region measuring 21 x 9 x 8 cm consistent with pseudoaneurysm mention the patient's history. Active extravasation of contrast into the pseudoaneurysm is noted. The proximal left superficial femoral artery appears to be occluded. Multiple stents are noted which are tortuous in the proximal thigh, which potentially may represent the superficial femoral artery. 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 Critical Value/emergent results were called by telephone at the time of interpretation on 04/10/2016 at 2:46 pm to Dr. Charlotte Crumb , who verbally acknowledged these results. Electronically Signed   By: Marijo Conception, M.D.   On: 04/10/2016 14:47    Assessment/Plan Timothy Juarez is a 65 year old male well known to our practice with a past medical history of  peripheral vascular disease, chronic kidney disease, HTN, COPD and congestive heart failure s/p a right lower extremity angiogram 03/31/16 for severe PAD with right foot ulcer formation now with hematoma and left extremity pseudoaneurysm 1) To specials for endovascular repair of left lower extremity pseudoaneurysm. 2) Possible OR in near future for hematoma evacuation. 3) Pain control 4) HTN - home medication regimen restarted 5) COPD - home medication regimen restarted 6) Admit to med / surg 7) Discussed with Dr. Mayme Genta, PA-C  04/10/2016 3:52 PM

## 2016-04-10 NOTE — ED Triage Notes (Signed)
Pt was seen 8/17 in ED, followed up with Dr. Lucky Cowboy on 8/18 for evaluation of pseudo-aneurysm.  Pt continues with left groin pain.  Pt from [facility].

## 2016-04-10 NOTE — ED Provider Notes (Addendum)
[facility] Emergency Department Provider Note  ____________________________________________   I have reviewed the triage vital signs and the nursing notes.   HISTORY  Chief Complaint Groin Pain    HPI Timothy Juarez is a 65 y.o. male who presents today complaining of pain in his known left inguinal pseudoaneurysm. Patient had a procedure done on the 14th, in that region, stenting, which then was followed by an ultrasound on the 17th of this month which showed a senior aneurysm. Patient states his pain is getting worse he cannot bear it. He denies any other new or worrisome symptoms. Patient is somewhat of a poor historian.He denies any numbness or weakness distal to this area, he states that he is supposed to have an outpatient CT but could not wait.    Past Medical History:  Diagnosis Date  . ARF (acute respiratory failure) (HCC)    H/O  . CHF (congestive heart failure) ([place])   . COPD (chronic obstructive pulmonary disease) ([place])   . Hypertension   . Hypokalemia   . Insomnia   . Muscle contracture    MUSCLE SPASMS  . Peripheral vascular disease ([place])   . Pressure ulcer     Patient Active Problem List   Diagnosis Date Noted  . Atherosclerosis of native arteries of extremities with gangrene, left leg ([place]) 11/01/2015  . Status post below knee amputation ([place]) 09/20/2015  . Ischemia of lower extremity 09/14/2015  . Atherosclerotic peripheral vascular disease with ulceration ([place]) 01/18/2015    Past Surgical History:  Procedure Laterality Date  . AMPUTATION Left 09/19/2015   Procedure: AMPUTATION BELOW KNEE;  Surgeon: Algernon Huxley, MD;  Location: [facility] ORS;  Service: Vascular;  Laterality: Left;  . AMPUTATION Left 11/01/2015   Procedure: AMPUTATION ABOVE KNEE;  Surgeon: Algernon Huxley, MD;  Location: [facility] ORS;  Service: Vascular;  Laterality: Left;  . APPLICATION OF WOUND VAC Left 10/18/2015   Procedure: APPLICATION OF WOUND VAC;  Surgeon: Algernon Huxley,  MD;  Location: [facility] ORS;  Service: Vascular;  Laterality: Left;  . PERIPHERAL VASCULAR CATHETERIZATION Left 01/18/2015   Procedure: Lower Extremity Angiography;  Surgeon: Algernon Huxley, MD;  Location: [place] [facility];  Service: Cardiovascular;  Laterality: Left;  . PERIPHERAL VASCULAR CATHETERIZATION N/A 01/18/2015   Procedure: Lower Extremity Intervention;  Surgeon: Algernon Huxley, MD;  Location: [place] [facility];  Service: Cardiovascular;  Laterality: N/A;  . PERIPHERAL VASCULAR CATHETERIZATION  07/30/2015   Procedure: Lower Extremity Intervention;  Surgeon: Algernon Huxley, MD;  Location: [place] [facility];  Service: Cardiovascular;;  . PERIPHERAL VASCULAR CATHETERIZATION N/A 07/30/2015   Procedure: Abdominal Aortogram w/Lower Extremity;  Surgeon: Algernon Huxley, MD;  Location: [place] [facility] LAB;  Service: Cardiovascular;  Laterality: N/A;  . PERIPHERAL VASCULAR CATHETERIZATION Left 08/22/2015   Procedure: Lower Extremity Angiography;  Surgeon: Algernon Huxley, MD;  Location: [place] [facility];  Service: Cardiovascular;  Laterality: Left;  . PERIPHERAL VASCULAR CATHETERIZATION Left 08/22/2015   Procedure: Lower Extremity Intervention;  Surgeon: Algernon Huxley, MD;  Location: [place] [facility] LAB;  Service: Cardiovascular;  Laterality: Left;  . PERIPHERAL VASCULAR CATHETERIZATION Right 03/31/2016   Procedure: Lower Extremity Angiography;  Surgeon: Algernon Huxley, MD;  Location: [place] [facility];  Service: Cardiovascular;  Laterality: Right;  . PERIPHERAL VASCULAR CATHETERIZATION  03/31/2016   Procedure: Lower Extremity Intervention;  Surgeon: Algernon Huxley, MD;  Location: [place] [facility];  Service: Cardiovascular;;  . WOUND DEBRIDEMENT  Left 10/18/2015   Procedure: DEBRIDEMENT WOUND   ( LEFT BKA DEBRIDEMENT );  Surgeon: Algernon Huxley, MD;  Location: [facility] ORS;  Service: Vascular;  Laterality: Left;    Prior to Admission medications   Medication Sig Start Date End Date Taking? Authorizing Provider   acetaminophen (TYLENOL) 325 MG tablet Take 650 mg by mouth at bedtime.     Historical Provider, MD  amoxicillin-clavulanate (AUGMENTIN) 875-125 MG tablet Take 1 tablet by mouth 2 (two) times daily. 04/03/16 04/13/16  Merlyn Lot, MD  apixaban (ELIQUIS) 2.5 MG TABS tablet Take 2.5 mg by mouth 2 (two) times daily.    Historical Provider, MD  aspirin EC 81 MG EC tablet Take 1 tablet (81 mg total) by mouth daily. Patient not taking: Reported on 03/26/2016 09/24/15   Algernon Huxley, MD  atorvastatin (LIPITOR) 20 MG tablet Take 1 tablet (20 mg total) by mouth daily. 03/31/16   Algernon Huxley, MD  baclofen (LIORESAL) 10 MG tablet Take 5 mg by mouth 3 (three) times daily. Reported on 09/14/2015    Historical Provider, MD  doxycycline (VIBRA-TABS) 100 MG tablet Take 100 mg by mouth 2 (two) times daily.     Historical Provider, MD  ferrous sulfate 324 (65 Fe) MG TBEC Take 1 tablet by mouth 2 (two) times daily.    Historical Provider, MD  Fluticasone-Salmeterol (ADVAIR) 250-50 MCG/DOSE AEPB Inhale 1 puff into the lungs 2 (two) times daily.    Historical Provider, MD  furosemide (LASIX) 40 MG tablet Take 40 mg by mouth 2 (two) times daily.     Historical Provider, MD  gabapentin (NEURONTIN) 100 MG capsule Take 100 mg by mouth at bedtime.    Historical Provider, MD  metoprolol tartrate (LOPRESSOR) 25 MG tablet Take 0.5 tablets (12.5 mg total) by mouth 2 (two) times daily. 09/24/15   Algernon Huxley, MD  Omega-3 Fatty Acids (FISH OIL) 1000 MG CAPS Take 1,000 mg by mouth every morning.    Historical Provider, MD  oxyCODONE-acetaminophen (PERCOCET/ROXICET) 5-325 MG tablet Take 2 tablets by mouth every 4 (four) hours as needed for moderate pain. 09/24/15   Algernon Huxley, MD  oxyCODONE-acetaminophen (PERCOCET/ROXICET) 5-325 MG tablet Take 1-2 tablets by mouth every 4 (four) hours as needed for moderate pain. Patient taking differently: Take 1 tablet by mouth 3 (three) times daily.  11/09/15   Kimberly A Stegmayer, PA-C  potassium  chloride (K-DUR) 10 MEQ tablet Take 20 mEq by mouth daily.     Historical Provider, MD  sodium chloride (OCEAN) 0.65 % SOLN nasal spray Place 1 spray into both nostrils 4 (four) times daily as needed for congestion.     Historical Provider, MD  temazepam (RESTORIL) 15 MG capsule Take 15 mg by mouth at bedtime as needed for sleep.    Historical Provider, MD  tiotropium (SPIRIVA) 18 MCG inhalation capsule Place 18 mcg into inhaler and inhale daily.    Historical Provider, MD    Allergies Review of patient's allergies indicates no known allergies.  No family history on file.  Social History Social History  Substance Use Topics  . Smoking status: Former Smoker    Packs/day: 1.00    Years: 44.00    Types: Cigarettes    Quit date: 11/17/2012  . Smokeless tobacco: Never Used  . Alcohol use No    Review of Systems Constitutional: No fever/chills Eyes: No visual changes. ENT: No sore throat. No stiff neck no neck pain Cardiovascular: Denies chest pain. Respiratory: Denies shortness  of breath. Gastrointestinal:   no vomiting.  No diarrhea.  No constipation. Genitourinary: Negative for dysuria. Musculoskeletal: Negative lower extremity swelling Skin: Negative for rash. Neurological: Negative for severe headaches, focal weakness or numbness. 10-point ROS otherwise negative.  ____________________________________________   PHYSICAL EXAM:  VITAL SIGNS: ED Triage Vitals  Enc Vitals Group     BP 04/10/16 1140 118/70     Pulse Rate 04/10/16 1140 92     Resp 04/10/16 1140 20     Temp 04/10/16 1140 98.2 F (36.8 C)     Temp Source 04/10/16 1140 Oral     SpO2 04/10/16 1140 100 %     Weight 04/10/16 1141 180 lb (81.6 kg)     Height 04/10/16 1141 6\' 1"  (1.854 m)     Head Circumference --      Peak Flow --      Pain Score 04/10/16 1141 10     Pain Loc --      Pain Edu? --      Excl. in [place]? --     Constitutional: Alert and oriented. Well appearing and in no acute distress. Neck: No  stridor.   Nontender with no meningismus Cardiovascular: Normal rate, regular rhythm. Grossly normal heart sounds.  Good peripheral circulation. Respiratory: Normal respiratory effort.  No retractions. Lungs CTAB. Abdominal: Soft and nontender. No distention. No guarding no rebound Back:  There is no focal tenderness or step off.  there is no midline tenderness there are no lesions noted. there is no CVA tenderness There is a left inguinal/possible fight swelling which is tender, pulses are palpated, it is not red or hot to touch. Musculoskeletal: No lower extremity tenderness, no upper extremity tenderness. No joint effusions, no DVT signs strong distal pulses no edema, good pulses noted on the right, on the left, there is an AKA which appears warm and perfused. Neurologic:  Normal speech and language. No gross focal neurologic deficits are appreciated.  Skin:  Skin is warm, dry and intact. No rash noted. Psychiatric: Mood and affect are normal. Speech and behavior are normal.  ____________________________________________   LABS (all labs ordered are listed, but only abnormal results are displayed)  Labs Reviewed  COMPREHENSIVE METABOLIC PANEL - Abnormal; Notable for the following:       Result Value   Glucose, Bld 105 (*)    Calcium 8.6 (*)    Albumin 2.9 (*)    All other components within normal limits  CBC WITH DIFFERENTIAL/PLATELET - Abnormal; Notable for the following:    WBC 17.9 (*)    RBC 3.81 (*)    Hemoglobin 7.5 (*)    HCT 23.5 (*)    MCV 61.7 (*)    MCH 19.7 (*)    RDW 20.6 (*)    Platelets 601 (*)    Neutro Abs 14.5 (*)    Monocytes Absolute 1.2 (*)    Basophils Absolute 0.2 (*)    All other components within normal limits  PROTIME-INR - Abnormal; Notable for the following:    Prothrombin Time 17.2 (*)    All other components within normal limits  APTT - Abnormal; Notable for the following:    aPTT 41 (*)    All other components within normal limits  TYPE AND  SCREEN   ____________________________________________  EKG  I personally interpreted any EKGs ordered by me or triage  ____________________________________________  RADIOLOGY  I reviewed any imaging ordered by me or triage that were performed during my shift and, if  possible, patient and/or family made aware of any abnormal findings. ____________________________________________   PROCEDURES  Procedure(s) performed: None  Procedures  Critical Care performed: None  ____________________________________________   INITIAL IMPRESSION / ASSESSMENT AND PLAN / ED COURSE  Pertinent labs & imaging results that were available during my care of the patient were reviewed by me and considered in my medical decision making (see chart for details).  Patient with pain from a known pseudoaneurysm. I discussed with Dr. dew who requested blood work and a CT angiogram to evaluate the area which we will performed. Patient was given pain medication and feels better at this time.  ----------------------------------------- 3:17 PM on 04/10/2016 -----------------------------------------  Dr. Lucky Cowboy aware of ct findings and will admit pt.   Clinical Course   ____________________________________________   FINAL CLINICAL IMPRESSION(S) / ED DIAGNOSES  Final diagnoses:  None      This chart was dictated using voice recognition software.  Despite best efforts to proofread,  errors can occur which can change meaning.      Schuyler Amor, MD 04/10/16 [place], MD 04/10/16 [phone]

## 2016-04-11 ENCOUNTER — Encounter: Payer: Self-pay | Admitting: Vascular Surgery

## 2016-04-11 DIAGNOSIS — L8990 Pressure ulcer of unspecified site, unspecified stage: Secondary | ICD-10-CM | POA: Insufficient documentation

## 2016-04-11 DIAGNOSIS — I724 Aneurysm of artery of lower extremity: Secondary | ICD-10-CM | POA: Diagnosis present

## 2016-04-11 LAB — MRSA PCR SCREENING: MRSA by PCR: NEGATIVE

## 2016-04-11 MED ORDER — OXYCODONE HCL 5 MG PO TABS: 5.0000 mg | ORAL_TABLET | ORAL | 0 refills | Status: DC | PRN

## 2016-04-11 MED ORDER — APIXABAN 2.5 MG PO TABS
2.5000 mg | ORAL_TABLET | Freq: Two times a day (BID) | ORAL | Status: DC
  Administered 2016-04-11: 2.5 mg via ORAL
  Filled 2016-04-11: qty 1

## 2016-04-11 NOTE — Progress Notes (Signed)
[facility]  Daily Progress Note   Subjective: 1 Day Post-Op: Ultrasound guidance for vascular access right femoral artery, Catheter placement into left profunda femorus artery from right femoral approach, Selective left lower extremity angiogram, Viabahn covered stent placement to the left profunda femorus artery with 5 mm x 25 mm length covered stent and StarClose closure device right femoral artery for pseudoaneurysm left femoral artery with hematoma formation.   Patient without complaint this AM. Pain greatly improved since intervention. Asking for discharge home.   Objective: Vitals:   04/10/16 1825 04/10/16 2055 04/11/16 0010 04/11/16 0433  BP: (!) 108/58 (!) 105/50 (!) 111/51 123/61  Pulse: 94 94 89 81  Resp: 18 (!) 22 (!) 22 20  Temp: 98.2 F (36.8 C) 97.4 F (36.3 C) 98.2 F (36.8 C) 97.8 F (36.6 C)  TempSrc: Oral Oral Oral Oral  SpO2: 96% 96% 99% 98%  Weight:      Height:        Intake/Output Summary (Last 24 hours) at 04/11/16 1050 Last data filed at 04/11/16 [id number]  Gross per 24 hour  Intake                0 ml  Output              900 ml  Net             -900 ml   Physical Exam: A&Ox3, NAD CV: RRR Pulmonary: CTA Bilaterally Abdomen: Soft, Nontender, Nondistended Right Groin: OR dressing in place. No swelling or drainage noted. Left: Groin: No pulsating mass noted. Minimal swelling noted.  Vascular:  Right Lower Extremity: Stable right foot ulcerations.  Left Stump: Healthy no skin breakdown.   Laboratory: CBC    Component Value Date/Time   WBC 17.9 (H) 04/10/2016 1219   HGB 7.5 (L) 04/10/2016 1219   HGB 14.3 12/01/2013 1317   HCT 23.5 (L) 04/10/2016 1219   HCT 43.9 12/01/2013 1317   PLT 601 (H) 04/10/2016 1219   PLT 250 12/01/2013 1317   BMET    Component Value Date/Time   NA 137 04/10/2016 1219   NA 136 12/01/2013 1317   K 3.8 04/10/2016 1219   K 3.9 12/01/2013 1317   CL 103 04/10/2016 1219   CL 100 12/01/2013 1317   CO2 28 04/10/2016 1219   CO2 33 (H) 12/01/2013 1317   GLUCOSE 105 (H) 04/10/2016 1219   GLUCOSE 91 12/01/2013 1317   BUN 15 04/10/2016 1219   BUN 24 (H) 09/25/2014 0846   CREATININE 0.91 04/10/2016 1219   CREATININE 1.29 09/25/2014 0846   CALCIUM 8.6 (L) 04/10/2016 1219   CALCIUM 9.4 12/01/2013 1317   GFRNONAA >60 04/10/2016 1219   GFRNONAA 60 (L) 09/25/2014 0846   GFRNONAA >60 03/16/2014 0750   GFRAA >60 04/10/2016 1219   GFRAA >60 09/25/2014 0846   GFRAA >60 03/16/2014 0750   Assessment/Planning: Timothy Juarez a 15 year old[place]ewell known to our practice with a past medical history of peripheral vascular disease, chronic kidney disease, HTN, COPD and congestive heart failure s/p a right lower extremity angiogram 03/31/16 for severe PAD with right foot ulcer formation now with hematoma and left extremity pseudoaneurysm s/p left lower extremity angiogram with pseudoaneurysm repair - Stable 1) Repair of pseudoaneurysm and blood flow into hematoma 2) Patient with significant improvement in symptoms. 3) Will watch hematoma and hope body with reabsord. Patient will follow up in our office for hematoma check in one week - if any  signs of infection or worsening will plan on hematoma evacuation in OR. 4) Discussed with Dr. Lucky Cowboy 5) OK to discharge home   Marcelle Overlie PA-C 04/11/2016 10:50 AM

## 2016-04-11 NOTE — Discharge Instructions (Signed)
Patient may remove groin dressing and shower as of Sunday. No driving while on pain medication.

## 2016-04-11 NOTE — Consult Note (Signed)
[place] [place] Nurse wound consult note Reason for Consult: Chronic arterial ulcer to right foot/transmetatrsal.   Wound type:Chronic vascular ulcers between toes, right foot.  Previous amputation of right second toe.  Pressure Ulcer POA: Yes Measurement: 1 cm x 1 cm x 0.1 cm denuded skin between toes.  Devitalized tissue present and malodorous, chronic.  Stage  3 pressure injury sacrum 1 cm x 1 cm x 0.3 cm  Wound bed: pink moist with devitalized tissue to right foot.  Drainage (amount, consistency, odor) Moderate serosanguinous  Musty odor.  Periwound: Macerated sacrum.  Dressing procedure/placement/frequency:Cleanse right foot with NS and pat gently dry.  Apply Xeroform gauze between toes.  Cover with ABD pad and kerlix.  Tape.   Cleanse sacral wound with Ns and apply sacral silicone border foam dressing.  Change every 3 days and PRN soilage.  Will not follow at this time.  Please re-consult if needed.  Domenic Moras RN BSN [place] Pager [phone]

## 2016-04-11 NOTE — Progress Notes (Signed)
04/11/2016  3:08 PM  Spoke with Tawni Levy, receiving nurse at [facility].  She questioned why pt did not have orders for antibiotics despite WBC of 17.9.  Spoke with Dr. Lucky Cowboy.  His reasoning was that the elevated WBC was likely due to a stress response from his pseudoaneurysm and subsequent surgery.  Dr. Lucky Cowboy feels there is no need for antibiotics at this time.  Informed Tawni Levy.  [name], RN

## 2016-04-11 NOTE — Clinical Social Work Note (Signed)
Clinical Social Work Assessment  Patient Details  Name: Timothy Juarez MRN: [id number] Date of Birth: [birth year]  Date of referral:  04/11/16               Reason for consult:  Facility Placement                Permission sought to share information with:    Permission granted to share information::     Name::        Agency::     Relationship::     Contact Information:     Housing/Transportation Living arrangements for the past 2 months:  [place] of Information:  Adult Children Patient Interpreter Needed:  None Criminal Activity/Legal Involvement Pertinent to Current Situation/Hospitalization:  No - Comment as needed Significant Relationships:  Adult Children Lives with:  Facility Resident Do you feel safe going back to the place where you live?    Need for family participation in patient care:     Care giving concerns:  Patient is a long term resident at [facility]   Social Worker assessment / plan:  Patient admitted late yesterday and discharging today back to his facility. Patient is aware and patient's daughter has been contacted and aware of discharge today. Tiffany at [facility] is aware as well and discharge information sent.   Employment status:  Disabled (Comment on whether or not currently receiving Disability) Insurance information:  Medicaid In [place] PT Recommendations:    Information / Referral to community resources:     Patient/Family's Response to care:  Patient's daughter expressed appreciation for call.  Patient/Family's Understanding of and Emotional Response to Diagnosis, Current Treatment, and Prognosis:  Patient's daughter wanted to know how patient's procedure went and CSW offered to get the patient's nurse but she said she would call back in a little while before patient left.  Emotional Assessment Appearance:    Attitude/Demeanor/Rapport:    Affect (typically observed):    Orientation:    Alcohol / Substance  use:  Not Applicable Psych involvement (Current and /or in the community):  No (Comment)  Discharge Needs  Concerns to be addressed:  Care Coordination Readmission within the last 30 days:  No Current discharge risk:  None Barriers to Discharge:  No Barriers Identified   Shela Leff, LCSW 04/11/2016, 2:50 PM

## 2016-04-11 NOTE — Progress Notes (Signed)
04/11/2016 4:12 PM  Timothy Juarez to be D/C'd Skilled nursing facility per MD order.  Discussed prescriptions and follow up appointments with the patient. Prescriptions given to patient, medication list explained in detail. Pt verbalized understanding.    Medication List    STOP taking these medications   amoxicillin-clavulanate 875-125 MG tablet Commonly known as:  AUGMENTIN   doxycycline 100 MG tablet Commonly known as:  VIBRA-TABS     TAKE these medications   acetaminophen 325 MG tablet Commonly known as:  TYLENOL Take 650 mg by mouth at bedtime.   apixaban 2.5 MG Tabs tablet Commonly known as:  ELIQUIS Take 2.5 mg by mouth 2 (two) times daily.   aspirin 81 MG EC tablet Take 1 tablet (81 mg total) by mouth daily.   atorvastatin 20 MG tablet Commonly known as:  LIPITOR Take 1 tablet (20 mg total) by mouth daily.   baclofen 10 MG tablet Commonly known as:  LIORESAL Take 10 mg by mouth 3 (three) times daily. Hold for sedation   ferrous sulfate 324 (65 Fe) MG Tbec Take 1 tablet by mouth 2 (two) times daily.   Fish Oil 1000 MG Caps Take 1,000 mg by mouth every morning.   Fluticasone-Salmeterol 250-50 MCG/DOSE Aepb Commonly known as:  ADVAIR Inhale 1 puff into the lungs 2 (two) times daily.   furosemide 40 MG tablet Commonly known as:  LASIX Take 40 mg by mouth 2 (two) times daily.   gabapentin 100 MG capsule Commonly known as:  NEURONTIN Take 100 mg by mouth at bedtime.   metoprolol tartrate 25 MG tablet Commonly known as:  LOPRESSOR Take 0.5 tablets (12.5 mg total) by mouth 2 (two) times daily.   oxyCODONE 5 MG immediate release tablet Commonly known as:  Oxy IR/ROXICODONE Take 1-2 tablets (5-10 mg total) by mouth every 4 (four) hours as needed for moderate pain or severe pain.   potassium chloride 10 MEQ tablet Commonly known as:  K-DUR Take 20 mEq by mouth daily.   sodium chloride 0.65 % Soln nasal spray Commonly known as:  OCEAN Place 1 spray  into both nostrils 4 (four) times daily as needed for congestion.   temazepam 15 MG capsule Commonly known as:  RESTORIL Take 15 mg by mouth at bedtime as needed for sleep.   tiotropium 18 MCG inhalation capsule Commonly known as:  SPIRIVA Place 18 mcg into inhaler and inhale daily.       Vitals:   04/11/16 0433 04/11/16 1300  BP: 123/61 124/62  Pulse: 81 80  Resp: 20 20  Temp: 97.8 F (36.6 C) 98 F (36.7 C)    Skin clean, dry and intact without evidence of skin break down, no evidence of skin tears noted. IV catheter discontinued intact. Site without signs and symptoms of complications. Dressing and pressure applied. Pt denies pain at this time. No complaints noted.  An After Visit Summary was printed and given to the patient. Patient escorted and D?C via EMS  [name]

## 2016-04-11 NOTE — Progress Notes (Signed)
04/11/2016 2:16 PM  Called report to receiving nurse at [facility].  Answered all of her questions and gave phone number if additional information is required.  [name], RN

## 2016-04-11 NOTE — Discharge Summary (Signed)
[facility]    Discharge Summary   Patient ID:  Timothy Juarez MRN: [id number] DOB/AGE: 17-Dec-1950 65 y.o.  Admit date: 04/10/2016 Discharge date: 04/11/2016 Date of Surgery: 04/10/2016 Surgeon: Surgeon(s): Algernon Huxley, MD  Admission Diagnosis: Pseudoaneurysm [facility]) [I72.9]  Discharge Diagnoses:  Pseudoaneurysm ([place]) [I72.9]  Secondary Diagnoses: Past Medical History:  Diagnosis Date  . ARF (acute respiratory failure) (HCC)    H/O  . CHF (congestive heart failure) ([place])   . COPD (chronic obstructive pulmonary disease) ([place])   . Hypertension   . Hypokalemia   . Insomnia   . Muscle contracture    MUSCLE SPASMS  . Peripheral vascular disease ([place])   . Pressure ulcer     Procedure(s): Lower Extremity Angiography  Discharged Condition: good  HPI:  Timothy Juarez a 65 year oldmalewell known to our practice with a past medical history of  peripheral vascular disease, chronic kidney disease, HTN, COPD and congestive heart failure. He presents to the [facility] ED s/p ultrasound guidance for vascular access leftfemoral artery, catheter placement into right anterior tibial artery from left femoral approach, aortogram and selective rightlower extremity angiogram, percutaneous transluminal angioplasty of the right anterior tibial artery with a 2 mm diameter angioplasty balloon distally and a 3 mm diameter angioplasty balloon proximally, percutaneous transluminal angioplasty of the right popliteal artery with 5 mm diameter Lutonix drug-coated angioplasty balloon, mid and distal SFA with 5 mm diameter angioplasty balloon, and proximal SFA with 6 mm diameter Lutonix drug-coated angioplasty balloon, viabahn covered stent placement to the right SFA for residual stenosis after angioplasty using a 6 mm diameter by 25 cm length stent, percutaneous transluminal angioplasty of the right external iliac artery with 7 mm diameter by 6 cm length Lutonix drug-coated  angioplasty balloon with StarClose closure device leftfemoral artery on 03/31/16 for peripheral artery disease with right lower extremity foot ulceration.   Patient presents to the [facility] ED with a chief complaint of "right lower extremity pain". Patient endorses a history of progressively worsening right lower extremity pain since his procedure on 03/31/16. He was seen in our ER on 04/03/16 found to have a pseudoaneurysm of the left femoral artery on ultrasound. Patient was scheduled to follow up with Dr. Lucky Cowboy tomorrow in our clinic for further evaluation. He presents today with worsening pain located predominately in his left groin. Pain does not radiate. No alleviating factors. Activity worsens the pain. The patient pain has significantly worsened prompting him to seek medical attention in the ED. Patient denies any fever, nausea, vomiting, rest pain or ulceration to his right lower extremity.   CT scan completed today notable for "large hematoma is noted in left inguinal region measuring 21 x 9 x 8cm consistent with pseudoaneurysm mention the patient's history. Active extravasation of contrast into the pseudoaneurysm is noted."  On 04/10/16, patient taken to the endovascular suite and underwent a ultrasound guidance for vascular access rightfemoral artery, Catheter placement into left profunda femorus artery from right femoral approach, Selective leftlower extremity angiogram, Viabahn covered stent placement to the left profunda femorus artery with 5 mm x 25 mm length covered stent and StarClose closure device rightfemoral artery for pseudoaneurysm left femoral artery with hematoma formation.  Patient tolerated the procedure well and was transferred to the surgical floor for overnight observation without issue. Patient night of surgery was unremarkable. POD#1 patient with repair of pseudoaneurysm stopping blood flow into hematoma. Patient with significant improvement in symptoms. Will watch hematoma and  hope body with reabsorb.  Patient will follow up in our office for hematoma check in one week - if any signs of infection or worsening will plan on hematoma evacuation in OR.  Hospital Course:  Timothy Juarez is a 65 y.o. male is S/P Left Procedure(s): Lower Extremity Angiography  Physical exam:  A&Ox3, NAD CV: RRR Pulmonary: CTA Bilaterally Abdomen: Soft, Nontender, Nondistended Right Groin: OR dressing in place. No swelling or drainage noted. Left: Groin: No pulsating mass noted. Minimal swelling noted.  Vascular:                       Right Lower Extremity: Stable right foot ulcerations.                       Left Stump: Healthy no skin breakdown.  Post-op wounds clean, dry, intact or healing well  Pt. Ambulating, voiding and taking PO diet without difficulty.  Pt pain controlled with PO pain meds.  Labs as below  Complications:none  Consults:  None  Significant Diagnostic Studies: CBC Lab Results  Component Value Date   WBC 17.9 (H) 04/10/2016   HGB 7.5 (L) 04/10/2016   HCT 23.5 (L) 04/10/2016   MCV 61.7 (L) 04/10/2016   PLT 601 (H) 04/10/2016   BMET    Component Value Date/Time   NA 137 04/10/2016 1219   NA 136 12/01/2013 1317   K 3.8 04/10/2016 1219   K 3.9 12/01/2013 1317   CL 103 04/10/2016 1219   CL 100 12/01/2013 1317   CO2 28 04/10/2016 1219   CO2 33 (H) 12/01/2013 1317   GLUCOSE 105 (H) 04/10/2016 1219   GLUCOSE 91 12/01/2013 1317   BUN 15 04/10/2016 1219   BUN 24 (H) 09/25/2014 0846   CREATININE 0.91 04/10/2016 1219   CREATININE 1.29 09/25/2014 0846   CALCIUM 8.6 (L) 04/10/2016 1219   CALCIUM 9.4 12/01/2013 1317   GFRNONAA >60 04/10/2016 1219   GFRNONAA 60 (L) 09/25/2014 0846   GFRNONAA >60 03/16/2014 0750   GFRAA >60 04/10/2016 1219   GFRAA >60 09/25/2014 0846   GFRAA >60 03/16/2014 0750   COAG Lab Results  Component Value Date   INR 1.39 04/10/2016   INR 1.20 10/30/2015   INR 1.17 10/18/2015   Disposition:  Discharge to  :Nursing Home    Medication List    STOP taking these medications   amoxicillin-clavulanate 875-125 MG tablet Commonly known as:  AUGMENTIN   doxycycline 100 MG tablet Commonly known as:  VIBRA-TABS     TAKE these medications   acetaminophen 325 MG tablet Commonly known as:  TYLENOL Take 650 mg by mouth at bedtime.   apixaban 2.5 MG Tabs tablet Commonly known as:  ELIQUIS Take 2.5 mg by mouth 2 (two) times daily.   aspirin 81 MG EC tablet Take 1 tablet (81 mg total) by mouth daily.   atorvastatin 20 MG tablet Commonly known as:  LIPITOR Take 1 tablet (20 mg total) by mouth daily.   baclofen 10 MG tablet Commonly known as:  LIORESAL Take 10 mg by mouth 3 (three) times daily. Hold for sedation   ferrous sulfate 324 (65 Fe) MG Tbec Take 1 tablet by mouth 2 (two) times daily.   Fish Oil 1000 MG Caps Take 1,000 mg by mouth every morning.   Fluticasone-Salmeterol 250-50 MCG/DOSE Aepb Commonly known as:  ADVAIR Inhale 1 puff into the lungs 2 (two) times daily.   furosemide 40 MG tablet Commonly known as:  LASIX Take 40 mg by mouth 2 (two) times daily.   gabapentin 100 MG capsule Commonly known as:  NEURONTIN Take 100 mg by mouth at bedtime.   metoprolol tartrate 25 MG tablet Commonly known as:  LOPRESSOR Take 0.5 tablets (12.5 mg total) by mouth 2 (two) times daily.   oxyCODONE 5 MG immediate release tablet Commonly known as:  Oxy IR/ROXICODONE Take 1-2 tablets (5-10 mg total) by mouth every 4 (four) hours as needed for moderate pain or severe pain.   potassium chloride 10 MEQ tablet Commonly known as:  K-DUR Take 20 mEq by mouth daily.   sodium chloride 0.65 % Soln nasal spray Commonly known as:  OCEAN Place 1 spray into both nostrils 4 (four) times daily as needed for congestion.   temazepam 15 MG capsule Commonly known as:  RESTORIL Take 15 mg by mouth at bedtime as needed for sleep.   tiotropium 18 MCG inhalation capsule Commonly known as:   SPIRIVA Place 18 mcg into inhaler and inhale daily.      Verbal and written Discharge instructions given to the patient. Wound care per Discharge AVS Follow-up Information    DEW,JASON, MD Follow up in 1 week(s).   Specialties:  Vascular Surgery, Radiology, Interventional Cardiology Why:  Hematoma Check Contact information: [place] [address] [phone]          Signed: Sela Hua, PA-C  04/11/2016, 11:07 AM

## 2016-04-11 NOTE — Progress Notes (Signed)
PT Cancellation Note  Patient Details Name: Timothy Juarez MRN: [id number] DOB: [birth year]   Cancelled Treatment:    Reason Eval/Treat Not Completed: Patient declined, no reason specified. Order received, chart reviewed. Pt resting supine upon room entry, declines to participate in PT evaluation. PT encouraged pt to participate in bed level exercises or transfer training, pt reports "I just have to transfer from one bed to another". Will re-attempt PT evaluation at later date/time as able.   Cuauhtemoc Huegel, SPT 04/11/2016, 2:37 PM

## 2016-04-17 ENCOUNTER — Ambulatory Visit: Payer: Medicaid Other

## 2016-06-24 DIAGNOSIS — I7389 Other specified peripheral vascular diseases: Secondary | ICD-10-CM | POA: Diagnosis not present

## 2016-07-04 DIAGNOSIS — M6281 Muscle weakness (generalized): Secondary | ICD-10-CM | POA: Diagnosis not present

## 2016-07-06 DIAGNOSIS — M6281 Muscle weakness (generalized): Secondary | ICD-10-CM | POA: Diagnosis not present

## 2016-07-07 DIAGNOSIS — M6281 Muscle weakness (generalized): Secondary | ICD-10-CM | POA: Diagnosis not present

## 2016-07-08 DIAGNOSIS — M6281 Muscle weakness (generalized): Secondary | ICD-10-CM | POA: Diagnosis not present

## 2016-07-09 DIAGNOSIS — M6281 Muscle weakness (generalized): Secondary | ICD-10-CM | POA: Diagnosis not present

## 2016-07-12 DIAGNOSIS — M6281 Muscle weakness (generalized): Secondary | ICD-10-CM | POA: Diagnosis not present

## 2016-07-14 DIAGNOSIS — M6281 Muscle weakness (generalized): Secondary | ICD-10-CM | POA: Diagnosis not present

## 2016-07-15 DIAGNOSIS — M6281 Muscle weakness (generalized): Secondary | ICD-10-CM | POA: Diagnosis not present

## 2016-07-16 DIAGNOSIS — M6281 Muscle weakness (generalized): Secondary | ICD-10-CM | POA: Diagnosis not present

## 2016-07-17 DIAGNOSIS — M6281 Muscle weakness (generalized): Secondary | ICD-10-CM | POA: Diagnosis not present

## 2016-07-18 DIAGNOSIS — M6281 Muscle weakness (generalized): Secondary | ICD-10-CM | POA: Diagnosis not present

## 2016-07-20 DIAGNOSIS — M6281 Muscle weakness (generalized): Secondary | ICD-10-CM | POA: Diagnosis not present

## 2016-07-21 DIAGNOSIS — M6281 Muscle weakness (generalized): Secondary | ICD-10-CM | POA: Diagnosis not present

## 2016-07-22 DIAGNOSIS — M6281 Muscle weakness (generalized): Secondary | ICD-10-CM | POA: Diagnosis not present

## 2016-07-23 DIAGNOSIS — M6281 Muscle weakness (generalized): Secondary | ICD-10-CM | POA: Diagnosis not present

## 2016-07-25 DIAGNOSIS — M6281 Muscle weakness (generalized): Secondary | ICD-10-CM | POA: Diagnosis not present

## 2016-07-28 DIAGNOSIS — M6281 Muscle weakness (generalized): Secondary | ICD-10-CM | POA: Diagnosis not present

## 2016-07-29 ENCOUNTER — Encounter (INDEPENDENT_AMBULATORY_CARE_PROVIDER_SITE_OTHER): Payer: Self-pay

## 2016-07-29 ENCOUNTER — Ambulatory Visit (INDEPENDENT_AMBULATORY_CARE_PROVIDER_SITE_OTHER): Payer: Self-pay | Admitting: Vascular Surgery

## 2016-07-29 ENCOUNTER — Encounter (INDEPENDENT_AMBULATORY_CARE_PROVIDER_SITE_OTHER): Payer: Medicaid Other

## 2016-07-29 DIAGNOSIS — M6281 Muscle weakness (generalized): Secondary | ICD-10-CM | POA: Diagnosis not present

## 2016-07-30 DIAGNOSIS — M6281 Muscle weakness (generalized): Secondary | ICD-10-CM | POA: Diagnosis not present

## 2016-07-31 ENCOUNTER — Other Ambulatory Visit (INDEPENDENT_AMBULATORY_CARE_PROVIDER_SITE_OTHER): Payer: Self-pay | Admitting: Vascular Surgery

## 2016-07-31 DIAGNOSIS — M6281 Muscle weakness (generalized): Secondary | ICD-10-CM | POA: Diagnosis not present

## 2016-07-31 DIAGNOSIS — M79605 Pain in left leg: Secondary | ICD-10-CM

## 2016-07-31 DIAGNOSIS — M79604 Pain in right leg: Secondary | ICD-10-CM

## 2016-07-31 DIAGNOSIS — I739 Peripheral vascular disease, unspecified: Secondary | ICD-10-CM

## 2016-08-01 ENCOUNTER — Ambulatory Visit (INDEPENDENT_AMBULATORY_CARE_PROVIDER_SITE_OTHER): Payer: Medicare Other

## 2016-08-01 ENCOUNTER — Encounter (INDEPENDENT_AMBULATORY_CARE_PROVIDER_SITE_OTHER): Payer: Self-pay | Admitting: Vascular Surgery

## 2016-08-01 ENCOUNTER — Ambulatory Visit (INDEPENDENT_AMBULATORY_CARE_PROVIDER_SITE_OTHER): Payer: Medicare Other | Admitting: Vascular Surgery

## 2016-08-01 VITALS — BP 114/62 | HR 60 | Resp 16 | Ht 73.0 in | Wt 174.0 lb

## 2016-08-01 DIAGNOSIS — M79605 Pain in left leg: Secondary | ICD-10-CM

## 2016-08-01 DIAGNOSIS — M6281 Muscle weakness (generalized): Secondary | ICD-10-CM | POA: Diagnosis not present

## 2016-08-01 DIAGNOSIS — I70235 Atherosclerosis of native arteries of right leg with ulceration of other part of foot: Secondary | ICD-10-CM

## 2016-08-01 DIAGNOSIS — Z89512 Acquired absence of left leg below knee: Secondary | ICD-10-CM | POA: Diagnosis not present

## 2016-08-01 DIAGNOSIS — I724 Aneurysm of artery of lower extremity: Secondary | ICD-10-CM | POA: Diagnosis not present

## 2016-08-01 DIAGNOSIS — M79604 Pain in right leg: Secondary | ICD-10-CM | POA: Diagnosis not present

## 2016-08-01 DIAGNOSIS — I739 Peripheral vascular disease, unspecified: Secondary | ICD-10-CM | POA: Diagnosis not present

## 2016-08-01 NOTE — Assessment & Plan Note (Signed)
Much better after previous stent placement.  About a third of the size as original area. No more pain.

## 2016-08-01 NOTE — Progress Notes (Signed)
MRN : [id number]  Timothy Juarez is a 65 y.o. ([birth year]) male who presents with chief complaint of  Chief Complaint  Patient presents with  . Re-evaluation    3 month ultrasound follow up  .  History of Present Illness: Patient returns today in follow up of His severe peripheral vascular disease. He is status post amputation on the left leg and has undergone multiple previous interventions on both lower extremities. He is also undergone treatment for a femoral pseudoaneurysm on the left leg and this appears much better today. He is currently not having ischemic rest pain. His ABIs today are stable at 0.66 on the right with pretty decent waveforms. Although duplex shows some diminished flow distally, his right lower extremity stents are patent. There appears to be moderate stenosis in the right external iliac artery. These are all stable from his previous studies. His ulcers are actually doing pretty well in many have healed although he still has a couple of small shallow ulcerations present.  Current Outpatient Prescriptions  Medication Sig Dispense Refill  . acetaminophen (TYLENOL) 325 MG tablet Take 650 mg by mouth at bedtime.     [name] apixaban (ELIQUIS) 2.5 MG TABS tablet Take 2.5 mg by mouth 2 (two) times daily.    [name] aspirin EC 81 MG EC tablet Take 1 tablet (81 mg total) by mouth daily. 30 tablet 11  . baclofen (LIORESAL) 10 MG tablet Take 10 mg by mouth 3 (three) times daily. Hold for sedation    . ferrous sulfate 324 (65 Fe) MG TBEC Take 1 tablet by mouth 2 (two) times daily.    . Fluticasone-Salmeterol (ADVAIR) 250-50 MCG/DOSE AEPB Inhale 1 puff into the lungs 2 (two) times daily.    . furosemide (LASIX) 40 MG tablet Take 40 mg by mouth 2 (two) times daily.     [name] gabapentin (NEURONTIN) 100 MG capsule Take 100 mg by mouth at bedtime.    . metoprolol tartrate (LOPRESSOR) 25 MG tablet Take 0.5 tablets (12.5 mg total) by mouth 2 (two) times daily. 60 tablet 5  . Omega-3 Fatty Acids  (FISH OIL) 1000 MG CAPS Take 1,000 mg by mouth every morning.    [name] oxyCODONE (OXY IR/ROXICODONE) 5 MG immediate release tablet Take 1-2 tablets (5-10 mg total) by mouth every 4 (four) hours as needed for moderate pain or severe pain. 60 tablet 0  . potassium chloride (K-DUR) 10 MEQ tablet Take 20 mEq by mouth daily.     . sodium chloride (OCEAN) 0.65 % SOLN nasal spray Place 1 spray into both nostrils 4 (four) times daily as needed for congestion.     . temazepam (RESTORIL) 15 MG capsule Take 15 mg by mouth at bedtime as needed for sleep.    [name] tiotropium (SPIRIVA) 18 MCG inhalation capsule Place 18 mcg into inhaler and inhale daily.    [name] atorvastatin (LIPITOR) 20 MG tablet Take 1 tablet (20 mg total) by mouth daily. (Patient not taking: Reported on 08/01/2016) 30 tablet 3   No current facility-administered medications for this visit.     Past Medical History:  Diagnosis Date  . ARF (acute respiratory failure) (HCC)    H/O  . CHF (congestive heart failure) ([place])   . COPD (chronic obstructive pulmonary disease) ([place])   . Hypertension   . Hypokalemia   . Insomnia   . Muscle contracture    MUSCLE SPASMS  . Peripheral vascular disease ([place])   . Pressure ulcer  Past Surgical History:  Procedure Laterality Date  . AMPUTATION Left 09/19/2015   Procedure: AMPUTATION BELOW KNEE;  Surgeon: Algernon Huxley, MD;  Location: [facility];  Service: Vascular;  Laterality: Left;  . AMPUTATION Left 11/01/2015   Procedure: AMPUTATION ABOVE KNEE;  Surgeon: Algernon Huxley, MD;  Location: [facility];  Service: Vascular;  Laterality: Left;  . APPLICATION OF WOUND VAC Left 10/18/2015   Procedure: APPLICATION OF WOUND VAC;  Surgeon: Algernon Huxley, MD;  Location: [facility];  Service: Vascular;  Laterality: Left;  . PERIPHERAL VASCULAR CATHETERIZATION Left 01/18/2015   Procedure: Lower Extremity Angiography;  Surgeon: Algernon Huxley, MD;  Location: [place] [facility];  Service: Cardiovascular;  Laterality: Left;  . PERIPHERAL  VASCULAR CATHETERIZATION N/A 01/18/2015   Procedure: Lower Extremity Intervention;  Surgeon: Algernon Huxley, MD;  Location: [place] [facility];  Service: Cardiovascular;  Laterality: N/A;  . PERIPHERAL VASCULAR CATHETERIZATION  07/30/2015   Procedure: Lower Extremity Intervention;  Surgeon: Algernon Huxley, MD;  Location: [place] [facility];  Service: Cardiovascular;;  . PERIPHERAL VASCULAR CATHETERIZATION N/A 07/30/2015   Procedure: Abdominal Aortogram w/Lower Extremity;  Surgeon: Algernon Huxley, MD;  Location: [place] [facility];  Service: Cardiovascular;  Laterality: N/A;  . PERIPHERAL VASCULAR CATHETERIZATION Left 08/22/2015   Procedure: Lower Extremity Angiography;  Surgeon: Algernon Huxley, MD;  Location: [place] [facility];  Service: Cardiovascular;  Laterality: Left;  . PERIPHERAL VASCULAR CATHETERIZATION Left 08/22/2015   Procedure: Lower Extremity Intervention;  Surgeon: Algernon Huxley, MD;  Location: [place] [facility];  Service: Cardiovascular;  Laterality: Left;  . PERIPHERAL VASCULAR CATHETERIZATION Right 03/31/2016   Procedure: Lower Extremity Angiography;  Surgeon: Algernon Huxley, MD;  Location: [place] [facility];  Service: Cardiovascular;  Laterality: Right;  . PERIPHERAL VASCULAR CATHETERIZATION  03/31/2016   Procedure: Lower Extremity Intervention;  Surgeon: Algernon Huxley, MD;  Location: [place] [facility];  Service: Cardiovascular;;  . PERIPHERAL VASCULAR CATHETERIZATION Left 04/10/2016   Procedure: Lower Extremity Angiography;  Surgeon: Algernon Huxley, MD;  Location: [place] [facility];  Service: Cardiovascular;  Laterality: Left;  . WOUND DEBRIDEMENT Left 10/18/2015   Procedure: DEBRIDEMENT WOUND   ( LEFT BKA DEBRIDEMENT );  Surgeon: Algernon Huxley, MD;  Location: [facility];  Service: Vascular;  Laterality: Left;    Social History Social History  Substance Use Topics  . Smoking status: Former Smoker    Packs/day: 1.00    Years: 44.00    Types: Cigarettes    Quit date: 11/17/2012  . Smokeless  tobacco: Never Used  . Alcohol use No     Family History No bleeding disorders or clotting disorders.  No Known Allergies   REVIEW OF SYSTEMS (Negative unless checked)  Constitutional: [] Weight loss  [] Fever  [] Chills Cardiac: [] Chest pain   [] Chest pressure   [] Palpitations   [] Shortness of breath when laying flat   [] Shortness of breath at rest   [] Shortness of breath with exertion. Vascular:  [] Pain in legs with walking   [] Pain in legs at rest   [] Pain in legs when laying flat   [] Claudication   [] Pain in feet when walking  [] Pain in feet at rest  [] Pain in feet when laying flat   [] History of DVT   [] Phlebitis   [] Swelling in legs   [] Varicose veins   [x] Non-healing ulcers Pulmonary:   [] Uses home oxygen   [] Productive cough   [] Hemoptysis   [] Wheeze  [] COPD   []   Asthma Neurologic:  [] Dizziness  [] Blackouts   [] Seizures   [] History of stroke   [] History of TIA  [] Aphasia   [] Temporary blindness   [] Dysphagia   [] Weakness or numbness in arms   [] Weakness or numbness in legs Musculoskeletal:  [] Arthritis   [] Joint swelling   [] Joint pain   [] Low back pain Hematologic:  [] Easy bruising  [] Easy bleeding   [] Hypercoagulable state   [] Anemic   Gastrointestinal:  [] Blood in stool   [] Vomiting blood  [] Gastroesophageal reflux/heartburn   [] Abdominal pain Genitourinary:  [] Chronic kidney disease   [] Difficult urination  [] Frequent urination  [] Burning with urination   [] Hematuria Skin:  [] Rashes   [x] Ulcers   [x] Wounds Psychological:  [] History of anxiety   []  History of major depression.  Physical Examination  BP 114/62 (BP Location: Right Arm)   Pulse 60   Resp 16   Ht 6\' 1"  (1.854 m)   Wt 174 lb (78.9 kg)   BMI 22.96 kg/m  Gen:  WD/WN, NAD  Head: /AT, No temporalis wasting. Ear/Nose/Throat: Hearing grossly intact, nares w/o erythema or drainage, trachea midline Eyes: Conjunctiva clear. Sclera non-icteric Neck: Supple.  No JVD.  Pulmonary:  Good air movement, no use of accessory  muscles.  Cardiac: RRR, normal S1, S2 Vascular:  Vessel Right Left  Radial Palpable Palpable  Ulnar Palpable Palpable  Brachial Palpable Palpable  Carotid Palpable, without bruit Palpable, without bruit  Aorta Not palpable N/A  Femoral Palpable Palpable  Popliteal Not Palpable Palpable  PT 1+ Palpable Palpable  DP Not Palpable Palpable   Gastrointestinal: soft, non-tender/non-distended. No guarding/reflex.  Musculoskeletal: M/S 5/5 throughout.  Left leg amputation. Gets around in a wheelchair. Small ulceration on the midportion of the medial aspect of the right foot. Several small clean toe ulcerations as well. Neurologic: Sensation grossly intact in extremities.  Symmetrical.  Speech is fluent.  Psychiatric: Judgment intact, Mood & affect appropriate for pt's clinical situation. Dermatologic: Ulcerations on the right foot as described above Lymph : No Cervical, Axillary, or Inguinal lymphadenopathy.      Labs No results found for this or any previous visit (from the past 2160 hour(s)).  Radiology No results found.    Assessment/Plan  Status post below knee amputation ([place]) Stable and healed  Pseudoaneurysm of left femoral artery (HCC) Much better after previous stent placement.  About a third of the size as original area. No more pain.  Atherosclerotic peripheral vascular disease with ulceration (HCC) His ABIs today are stable at 0.66 on the right with pretty decent waveforms. Although duplex shows some diminished flow distally, his right lower extremity stents are patent. There appears to be moderate stenosis in the right external iliac artery. These are all stable from his previous studies. His ulcers are actually doing pretty well in many have healed although he still has a couple of small shallow ulcerations present. We will continue to monitor this closely because of his severe disease and multiple previous interventions as well as persistent ulceration. I will plan to  see him back in 3 months.    [name], MD  08/01/2016 11:33 AM    This note was created with Dragon medical transcription system.  Any errors from dictation are purely unintentional

## 2016-08-01 NOTE — Assessment & Plan Note (Signed)
Stable and healed

## 2016-08-01 NOTE — Assessment & Plan Note (Signed)
His ABIs today are stable at 0.66 on the right with pretty decent waveforms. Although duplex shows some diminished flow distally, his right lower extremity stents are patent. There appears to be moderate stenosis in the right external iliac artery. These are all stable from his previous studies. His ulcers are actually doing pretty well in many have healed although he still has a couple of small shallow ulcerations present. We will continue to monitor this closely because of his severe disease and multiple previous interventions as well as persistent ulceration. I will plan to see him back in 3 months.

## 2016-08-03 DIAGNOSIS — M6281 Muscle weakness (generalized): Secondary | ICD-10-CM | POA: Diagnosis not present

## 2016-08-04 DIAGNOSIS — M6281 Muscle weakness (generalized): Secondary | ICD-10-CM | POA: Diagnosis not present

## 2016-08-05 DIAGNOSIS — M6281 Muscle weakness (generalized): Secondary | ICD-10-CM | POA: Diagnosis not present

## 2016-08-07 DIAGNOSIS — Z79899 Other long term (current) drug therapy: Secondary | ICD-10-CM | POA: Diagnosis not present

## 2016-09-06 DIAGNOSIS — R05 Cough: Secondary | ICD-10-CM | POA: Diagnosis not present

## 2016-09-06 DIAGNOSIS — R509 Fever, unspecified: Secondary | ICD-10-CM | POA: Diagnosis not present

## 2016-09-07 ENCOUNTER — Other Ambulatory Visit
Admission: RE | Admit: 2016-09-07 | Discharge: 2016-09-07 | Disposition: A | Discharge status: Home / Self Care | Payer: Medicare Other | Source: Ambulatory Visit | Attending: Family Medicine | Admitting: Family Medicine

## 2016-09-07 DIAGNOSIS — Z89612 Acquired absence of left leg above knee: Secondary | ICD-10-CM | POA: Diagnosis not present

## 2016-09-07 LAB — URINALYSIS, COMPLETE (UACMP) WITH MICROSCOPIC
BILIRUBIN URINE: NEGATIVE
Bacteria, UA: NONE SEEN
GLUCOSE, UA: NEGATIVE mg/dL
HGB URINE DIPSTICK: NEGATIVE
Ketones, ur: NEGATIVE mg/dL
LEUKOCYTES UA: NEGATIVE
NITRITE: POSITIVE — AB
PH: 5 (ref 5.0–8.0)
Protein, ur: NEGATIVE mg/dL
SPECIFIC GRAVITY, URINE: 1.017 (ref 1.005–1.030)
SQUAMOUS EPITHELIAL / LPF: NONE SEEN

## 2016-09-08 LAB — URINE CULTURE

## 2016-09-11 DIAGNOSIS — R509 Fever, unspecified: Secondary | ICD-10-CM | POA: Diagnosis not present

## 2016-09-11 DIAGNOSIS — N390 Urinary tract infection, site not specified: Secondary | ICD-10-CM | POA: Diagnosis not present

## 2016-09-11 DIAGNOSIS — R319 Hematuria, unspecified: Secondary | ICD-10-CM | POA: Diagnosis not present

## 2016-09-16 DIAGNOSIS — I7389 Other specified peripheral vascular diseases: Secondary | ICD-10-CM | POA: Diagnosis not present

## 2016-09-16 DIAGNOSIS — J984 Other disorders of lung: Secondary | ICD-10-CM | POA: Diagnosis not present

## 2016-09-16 DIAGNOSIS — D6489 Other specified anemias: Secondary | ICD-10-CM | POA: Diagnosis not present

## 2016-09-16 DIAGNOSIS — I5022 Chronic systolic (congestive) heart failure: Secondary | ICD-10-CM | POA: Diagnosis not present

## 2016-09-19 DIAGNOSIS — R2681 Unsteadiness on feet: Secondary | ICD-10-CM | POA: Diagnosis not present

## 2016-09-25 DIAGNOSIS — J984 Other disorders of lung: Secondary | ICD-10-CM | POA: Diagnosis not present

## 2016-09-25 DIAGNOSIS — I7389 Other specified peripheral vascular diseases: Secondary | ICD-10-CM | POA: Diagnosis not present

## 2016-09-25 DIAGNOSIS — D508 Other iron deficiency anemias: Secondary | ICD-10-CM | POA: Diagnosis not present

## 2016-09-25 DIAGNOSIS — I5022 Chronic systolic (congestive) heart failure: Secondary | ICD-10-CM | POA: Diagnosis not present

## 2016-10-08 IMAGING — US US EXTREM LOW VENOUS*R*
2 series · 13 of 24 positions shown · non-contrast
Comparison: None.

CLINICAL DATA: Right leg pain and swelling, peripheral vascular
disease, recent right lower extremity vascular prevention via of
left femoral access



[Series 1: us extrem low venous*right* · 0.15mm/px · 11 of 40 slices shown (1 of 2)]
[im 1/40]
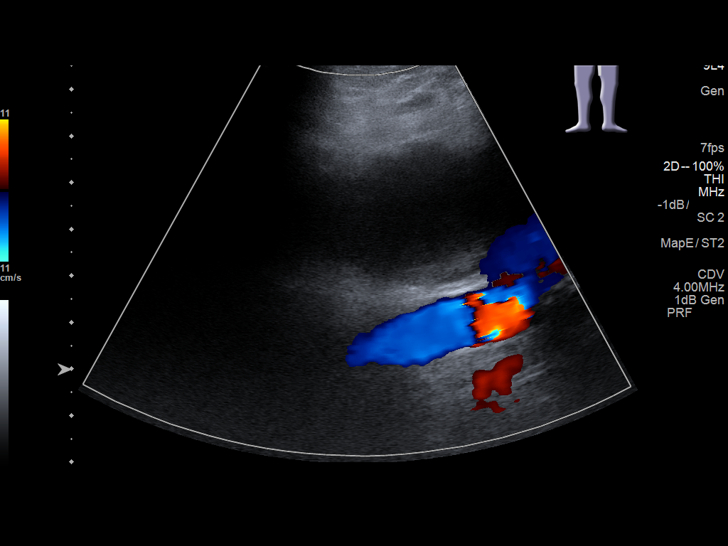
[im 4/40]
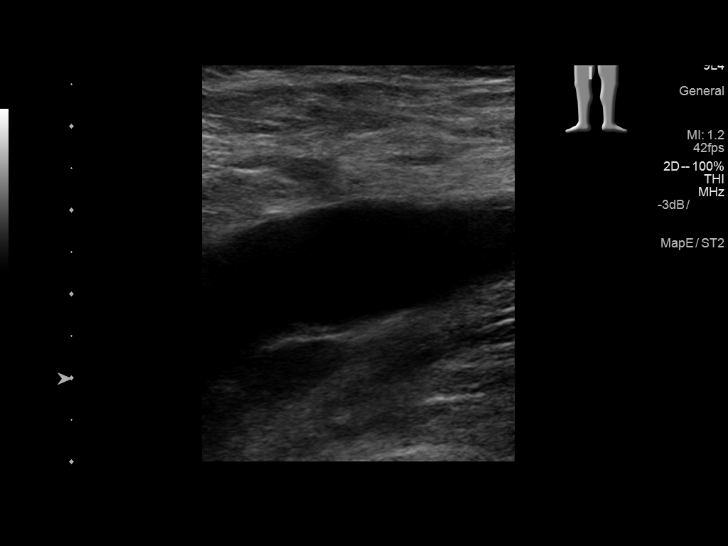
[im 8/40]
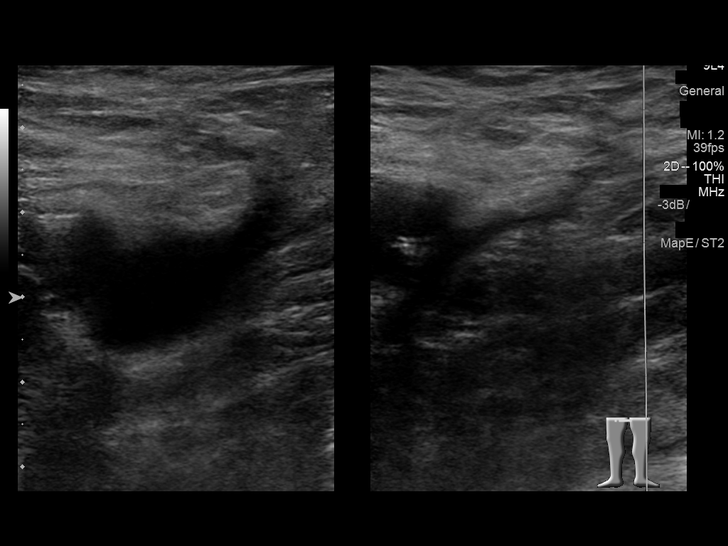
[im 14/40]
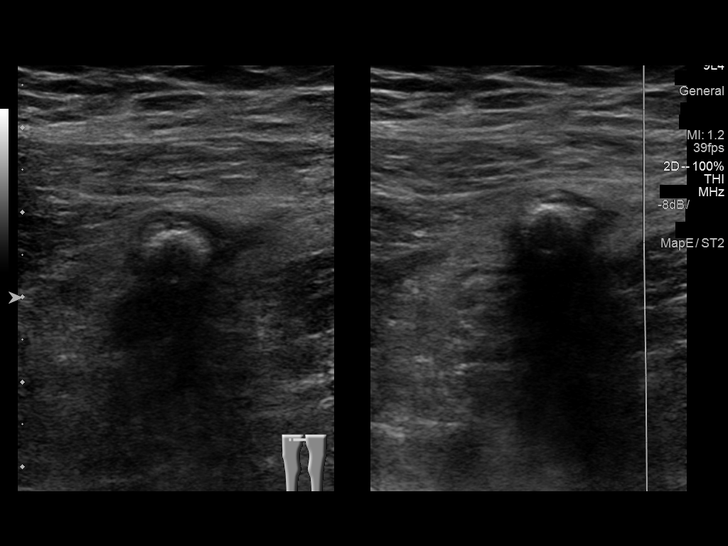
[im 17/40]
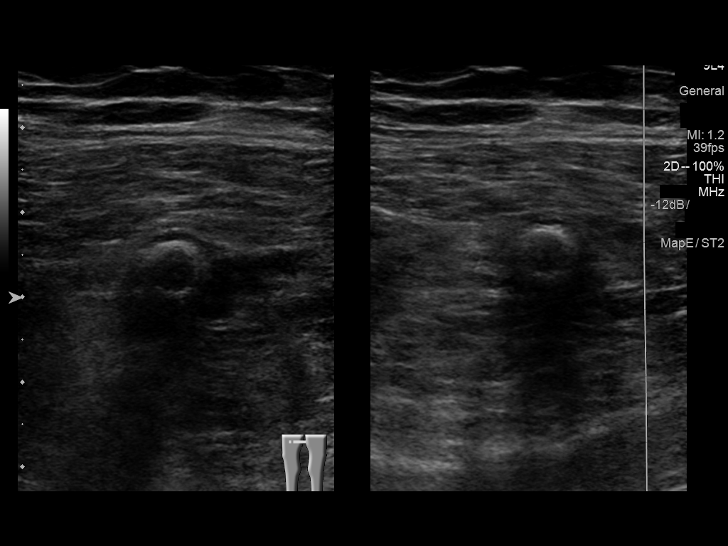
[im 21/40]
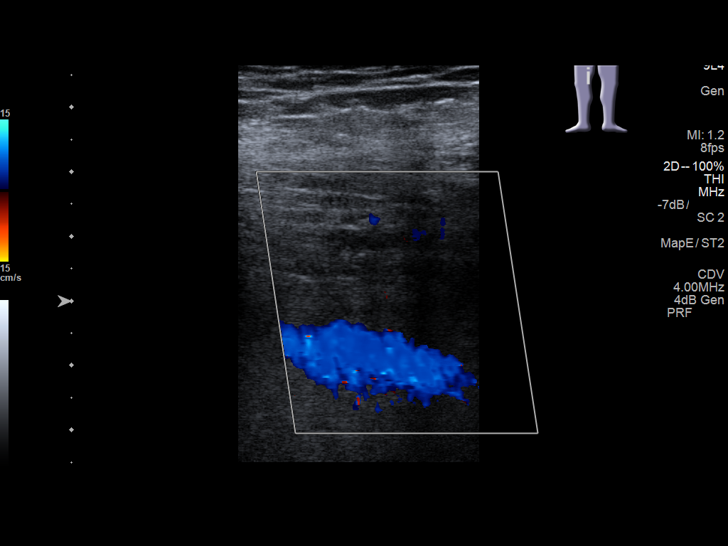
[im 25/40]
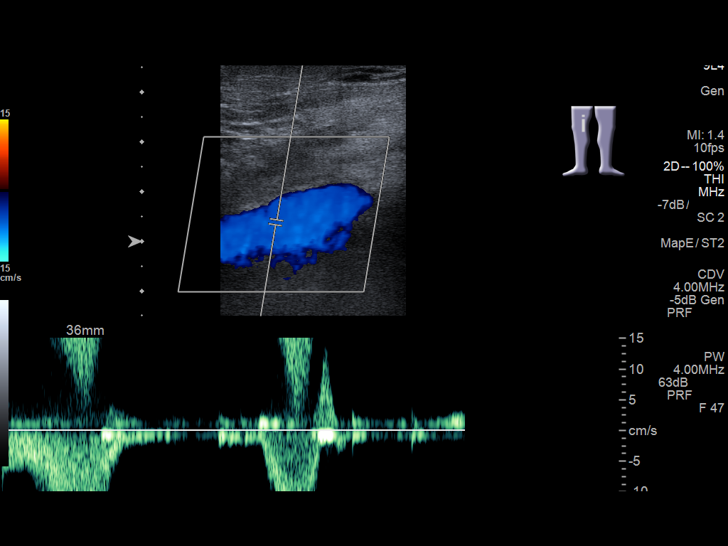
[im 27/40]
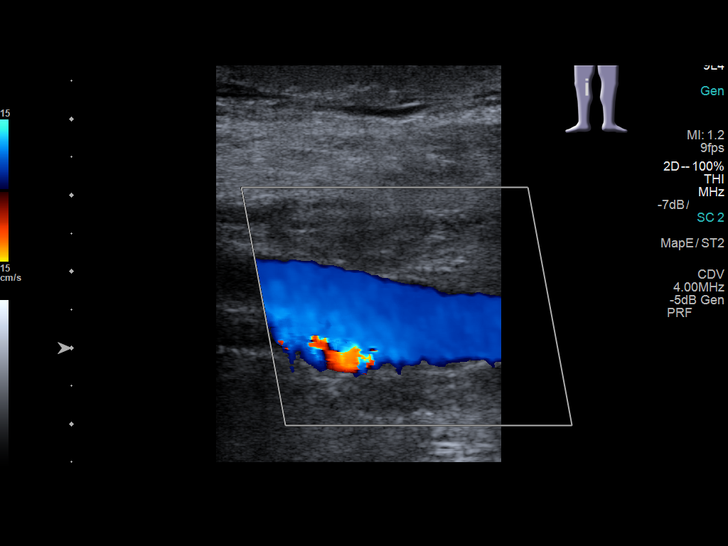
[im 30/40]
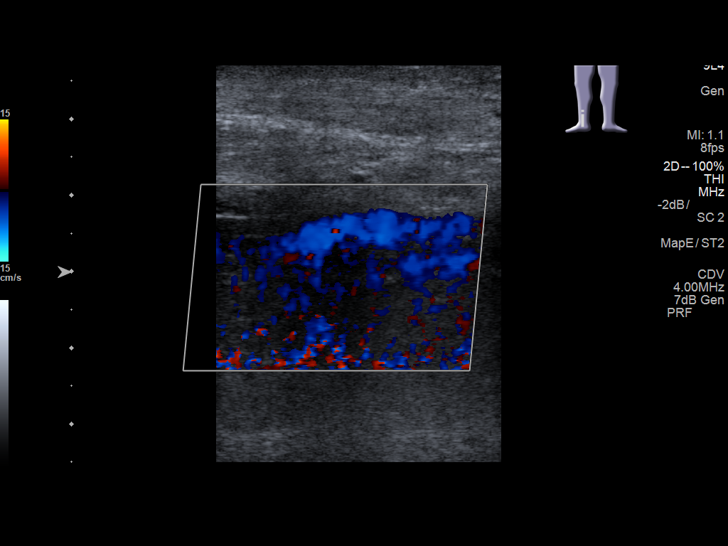
[im 34/40]
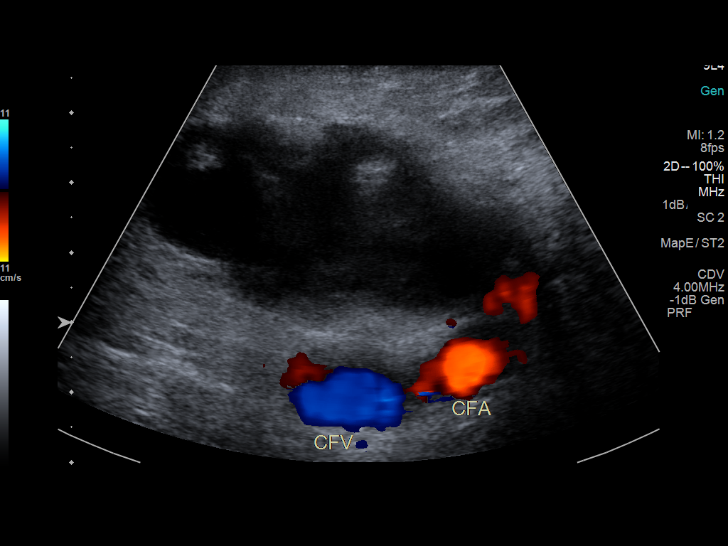
[im 38/40]
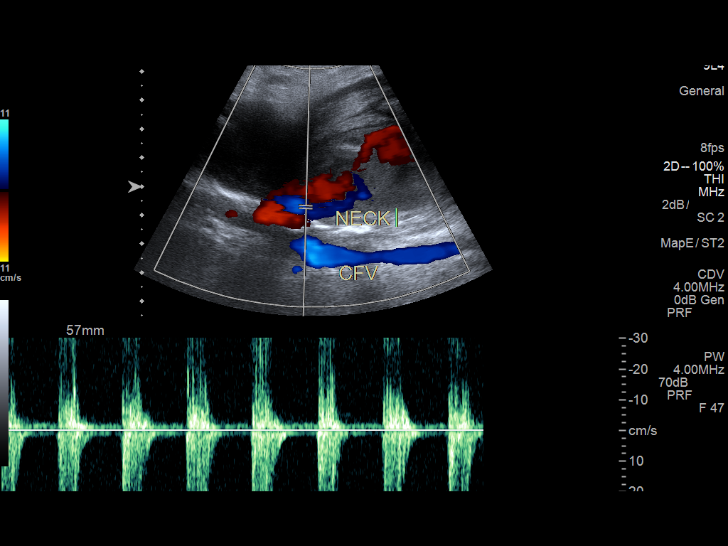

[Series 2: us extrem low venous*right* · 4 acquisitions, 2 frames shown (2 of 2)]
[im 1/4]
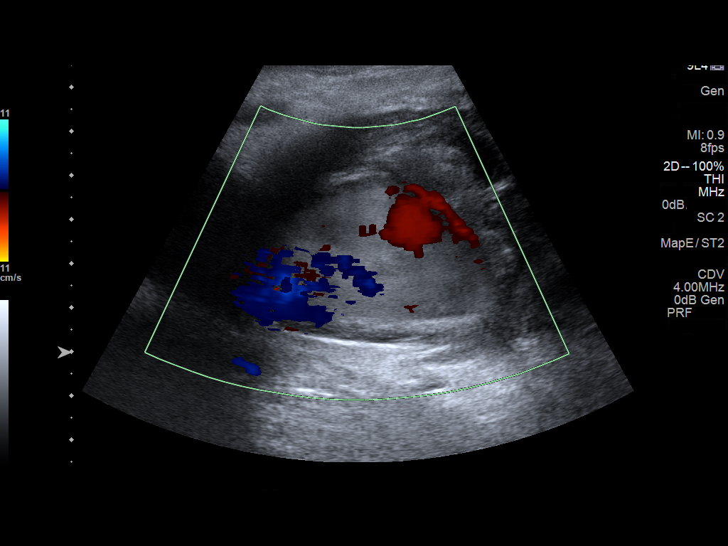
[im 4/4]
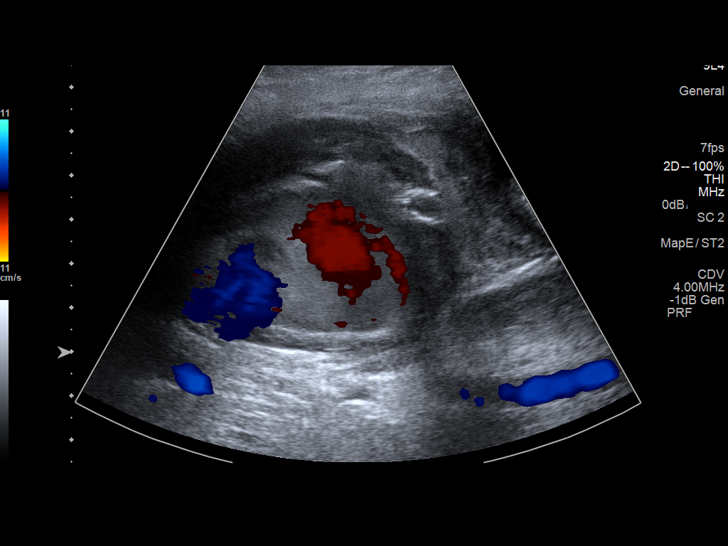

[13 of 24 positions shown; findings below may reference images not displayed]

FINDINGS: Contralateral Common Femoral Vein: Respiratory phasicity is normal
and symmetric with the symptomatic side. No evidence of thrombus.
Normal compressibility.

Common Femoral Vein: No evidence of thrombus. Normal
compressibility, respiratory phasicity and response to augmentation.

Saphenofemoral Junction: No evidence of thrombus. Normal
compressibility and flow on color Doppler imaging.

Profunda Femoral Vein: No evidence of thrombus. Normal
compressibility and flow on color Doppler imaging.

Femoral Vein: No evidence of thrombus. Normal compressibility,
respiratory phasicity and response to augmentation.

Popliteal Vein: No evidence of thrombus. Normal compressibility,
respiratory phasicity and response to augmentation.

Calf Veins: No evidence of thrombus. Normal compressibility and flow
on color Doppler imaging.

Superficial Great Saphenous Vein: No evidence of thrombus. Normal
compressibility and flow on color Doppler imaging.

Venous Reflux:  None.

Other Findings: In the left inguinal region, there is a large
complex vascular abnormality with residual internal flow and partial
thrombosis compatible with a femoral pseudoaneurysm. There is
surrounding hematoma throughout the left inguinal region. The entire
hematoma/partially thrombosed pseudoaneurysm complex measures 15 x
5.3 x 6.8 cm by ultrasound.
IMPRESSION: Left inguinal/femoral partially thrombosed pseudoaneurysm/hematoma
measuring 15 x 5.3 x 6.8 cm.

Negative for right lower extremity DVT.

These results will be called to the ordering clinician or
representative by the [HOSPITAL] at the imaging location.

## 2016-10-26 DIAGNOSIS — R509 Fever, unspecified: Secondary | ICD-10-CM | POA: Diagnosis not present

## 2016-10-28 DIAGNOSIS — J988 Other specified respiratory disorders: Secondary | ICD-10-CM | POA: Diagnosis not present

## 2016-10-28 DIAGNOSIS — D649 Anemia, unspecified: Secondary | ICD-10-CM | POA: Diagnosis not present

## 2016-10-30 DIAGNOSIS — D649 Anemia, unspecified: Secondary | ICD-10-CM | POA: Diagnosis not present

## 2016-11-04 ENCOUNTER — Encounter (INDEPENDENT_AMBULATORY_CARE_PROVIDER_SITE_OTHER): Payer: Self-pay | Admitting: Vascular Surgery

## 2016-11-04 ENCOUNTER — Ambulatory Visit (INDEPENDENT_AMBULATORY_CARE_PROVIDER_SITE_OTHER): Payer: Medicare Other

## 2016-11-04 ENCOUNTER — Ambulatory Visit (INDEPENDENT_AMBULATORY_CARE_PROVIDER_SITE_OTHER): Payer: Medicare Other | Admitting: Vascular Surgery

## 2016-11-04 VITALS — BP 101/60 | HR 59 | Resp 16 | Ht 73.0 in | Wt 181.0 lb

## 2016-11-04 DIAGNOSIS — E785 Hyperlipidemia, unspecified: Secondary | ICD-10-CM | POA: Diagnosis not present

## 2016-11-04 DIAGNOSIS — I739 Peripheral vascular disease, unspecified: Secondary | ICD-10-CM

## 2016-11-04 DIAGNOSIS — I70235 Atherosclerosis of native arteries of right leg with ulceration of other part of foot: Secondary | ICD-10-CM

## 2016-11-04 DIAGNOSIS — Z89512 Acquired absence of left leg below knee: Secondary | ICD-10-CM | POA: Diagnosis not present

## 2016-11-04 NOTE — Progress Notes (Signed)
Subjective:    Patient ID: Timothy Juarez, male    DOB: 1950/09/25, 66 y.o.   MRN: [id number] Chief Complaint  Patient presents with  . Re-evaluation    3-4 month follow up   Patient presents for a four month follow up for PAD. He is without complaint today. Denies any new ulcer formation. He also denies any claudication / rest pain to the RLE. States his left AKA stump is intact - no breakdown of skin. The patient underwent an ABI which showed Right ABI: 0.61 and Left AKA (on 08/01/16, Right ABI: 0.66 and Left AKA). A right lower extremity arterial duplex is notable for mixed biphasic and monophasic waveforms / patent stents. The patient denies any claudication like symptoms, rest pain or ulcers to his feet / toes.    Review of Systems  Constitutional: Negative.   HENT: Negative.   Eyes: Negative.   Respiratory: Negative.   Cardiovascular: Negative.   Gastrointestinal: Negative.   Endocrine: Negative.   Genitourinary: Negative.   Skin: Negative.   Allergic/Immunologic: Negative.   Neurological: Negative.   Hematological: Negative.   Psychiatric/Behavioral: Negative.        Objective:   Physical Exam  Constitutional: He is oriented to person, place, and time. He appears well-developed and well-nourished.  HENT:  Head: Normocephalic and atraumatic.  Right Ear: External ear normal.  Left Ear: External ear normal.  Eyes: Conjunctivae and EOM are normal. Pupils are equal, round, and reactive to light.  Neck: Normal range of motion.  Cardiovascular: Normal rate, regular rhythm, normal heart sounds and intact distal pulses.   Pulses:      Radial pulses are 2+ on the right side, and 2+ on the left side.       Dorsalis pedis pulses are 1+ on the right side.       Posterior tibial pulses are 1+ on the right side.  Left AKA: Stump healthy. Skin intact.   Pulmonary/Chest: Effort normal.  Musculoskeletal: Normal range of motion.  Neurological: He is alert and oriented to person,  place, and time.  Skin: Skin is warm and dry.  Psychiatric: He has a normal mood and affect. His behavior is normal. Judgment and thought content normal.   BP 101/60 (BP Location: Left Arm)   Pulse (!) 59   Resp 16   Ht 6\' 1"  (1.854 m)   Wt 181 lb (82.1 kg)   BMI 23.88 kg/m   Past Medical History:  Diagnosis Date  . ARF (acute respiratory failure) (HCC)    H/O  . CHF (congestive heart failure) ([place])   . COPD (chronic obstructive pulmonary disease) ([place])   . Hypertension   . Hypokalemia   . Insomnia   . Muscle contracture    MUSCLE SPASMS  . Peripheral vascular disease ([place])   . Pressure ulcer    Social History   Social History  . Marital status: Legally Separated    Spouse name: N/A  . Number of children: N/A  . Years of education: N/A   Occupational History  . Not on file.   Social History Main Topics  . Smoking status: Former Smoker    Packs/day: 1.00    Years: 44.00    Types: Cigarettes    Quit date: 11/17/2012  . Smokeless tobacco: Never Used  . Alcohol use No  . Drug use: No  . Sexual activity: Not on file   Other Topics Concern  . Not on file   Social History Narrative  .  No narrative on file   Past Surgical History:  Procedure Laterality Date  . AMPUTATION Left 09/19/2015   Procedure: AMPUTATION BELOW KNEE;  Surgeon: Algernon Huxley, MD;  Location: [facility];  Service: Vascular;  Laterality: Left;  . AMPUTATION Left 11/01/2015   Procedure: AMPUTATION ABOVE KNEE;  Surgeon: Algernon Huxley, MD;  Location: [facility];  Service: Vascular;  Laterality: Left;  . APPLICATION OF WOUND VAC Left 10/18/2015   Procedure: APPLICATION OF WOUND VAC;  Surgeon: Algernon Huxley, MD;  Location: [facility];  Service: Vascular;  Laterality: Left;  . PERIPHERAL VASCULAR CATHETERIZATION Left 01/18/2015   Procedure: Lower Extremity Angiography;  Surgeon: Algernon Huxley, MD;  Location: [place] [facility];  Service: Cardiovascular;  Laterality: Left;  . PERIPHERAL VASCULAR CATHETERIZATION N/A  01/18/2015   Procedure: Lower Extremity Intervention;  Surgeon: Algernon Huxley, MD;  Location: [place] [facility];  Service: Cardiovascular;  Laterality: N/A;  . PERIPHERAL VASCULAR CATHETERIZATION  07/30/2015   Procedure: Lower Extremity Intervention;  Surgeon: Algernon Huxley, MD;  Location: [place] [facility];  Service: Cardiovascular;;  . PERIPHERAL VASCULAR CATHETERIZATION N/A 07/30/2015   Procedure: Abdominal Aortogram w/Lower Extremity;  Surgeon: Algernon Huxley, MD;  Location: [place] [facility];  Service: Cardiovascular;  Laterality: N/A;  . PERIPHERAL VASCULAR CATHETERIZATION Left 08/22/2015   Procedure: Lower Extremity Angiography;  Surgeon: Algernon Huxley, MD;  Location: [place] [facility];  Service: Cardiovascular;  Laterality: Left;  . PERIPHERAL VASCULAR CATHETERIZATION Left 08/22/2015   Procedure: Lower Extremity Intervention;  Surgeon: Algernon Huxley, MD;  Location: [place] [facility];  Service: Cardiovascular;  Laterality: Left;  . PERIPHERAL VASCULAR CATHETERIZATION Right 03/31/2016   Procedure: Lower Extremity Angiography;  Surgeon: Algernon Huxley, MD;  Location: [place] [facility];  Service: Cardiovascular;  Laterality: Right;  . PERIPHERAL VASCULAR CATHETERIZATION  03/31/2016   Procedure: Lower Extremity Intervention;  Surgeon: Algernon Huxley, MD;  Location: [place] [facility];  Service: Cardiovascular;;  . PERIPHERAL VASCULAR CATHETERIZATION Left 04/10/2016   Procedure: Lower Extremity Angiography;  Surgeon: Algernon Huxley, MD;  Location: [place] [facility];  Service: Cardiovascular;  Laterality: Left;  . WOUND DEBRIDEMENT Left 10/18/2015   Procedure: DEBRIDEMENT WOUND   ( LEFT BKA DEBRIDEMENT );  Surgeon: Algernon Huxley, MD;  Location: [facility];  Service: Vascular;  Laterality: Left;   No family history on file.  No Known Allergies     Assessment & Plan:  Patient presents for a four month follow up for PAD. He is without complaint today. Denies any new ulcer formation. He also denies any  claudication / rest pain to the RLE. States his left AKA stump is intact - no breakdown of skin. The patient underwent an ABI which showed Right ABI: 0.61 and Left AKA (on 08/01/16, Right ABI: 0.66 and Left AKA). A right lower extremity arterial duplex is notable for mixed biphasic and monophasic waveforms / patent stents. The patient denies any claudication like symptoms, rest pain or ulcers to his feet / toes.   1. PAD (peripheral artery disease) ([place]) - Stable Patient asymptomatic. Stable ABI and duplex. Will bring patient back in six months and keep monitoring.  Patient will most likely need angiogram in the future.  I have discussed with the patient at length the risk factors for and pathogenesis of atherosclerotic disease and encouraged a healthy diet, regular exercise regimen and blood pressure / glucose control.  The patient was encouraged to call the  office in the interim if he experiences any claudication like symptoms, rest pain or ulcers to his feet / toes.  2. Status post below knee amputation of left lower extremity (HCC) - Stable Stump healthy. No indication for intervention.  3. Hyperlipidemia, unspecified hyperlipidemia type - Stable Encouraged good control as its slows the progression of atherosclerotic disease.  Current Outpatient Prescriptions on File Prior to Visit  Medication Sig Dispense Refill  . acetaminophen (TYLENOL) 325 MG tablet Take 650 mg by mouth at bedtime.     [name] apixaban (ELIQUIS) 2.5 MG TABS tablet Take 2.5 mg by mouth 2 (two) times daily.    [name] aspirin EC 81 MG EC tablet Take 1 tablet (81 mg total) by mouth daily. 30 tablet 11  . atorvastatin (LIPITOR) 20 MG tablet Take 1 tablet (20 mg total) by mouth daily. 30 tablet 3  . baclofen (LIORESAL) 10 MG tablet Take 10 mg by mouth 3 (three) times daily. Hold for sedation    . ferrous sulfate 324 (65 Fe) MG TBEC Take 1 tablet by mouth 2 (two) times daily.    . Fluticasone-Salmeterol (ADVAIR) 250-50 MCG/DOSE AEPB  Inhale 1 puff into the lungs 2 (two) times daily.    . furosemide (LASIX) 40 MG tablet Take 40 mg by mouth 2 (two) times daily.     [name] gabapentin (NEURONTIN) 100 MG capsule Take 100 mg by mouth at bedtime.    . metoprolol tartrate (LOPRESSOR) 25 MG tablet Take 0.5 tablets (12.5 mg total) by mouth 2 (two) times daily. 60 tablet 5  . Omega-3 Fatty Acids (FISH OIL) 1000 MG CAPS Take 1,000 mg by mouth every morning.    [name] oxyCODONE (OXY IR/ROXICODONE) 5 MG immediate release tablet Take 1-2 tablets (5-10 mg total) by mouth every 4 (four) hours as needed for moderate pain or severe pain. 60 tablet 0  . potassium chloride (K-DUR) 10 MEQ tablet Take 20 mEq by mouth daily.     . sodium chloride (OCEAN) 0.65 % SOLN nasal spray Place 1 spray into both nostrils 4 (four) times daily as needed for congestion.     . temazepam (RESTORIL) 15 MG capsule Take 15 mg by mouth at bedtime as needed for sleep.    [name] tiotropium (SPIRIVA) 18 MCG inhalation capsule Place 18 mcg into inhaler and inhale daily.     No current facility-administered medications on file prior to visit.     There are no Patient Instructions on file for this visit. No Follow-up on file.   KIMBERLY A STEGMAYER, PA-C

## 2016-11-06 DIAGNOSIS — I10 Essential (primary) hypertension: Secondary | ICD-10-CM | POA: Diagnosis not present

## 2016-11-06 DIAGNOSIS — I7389 Other specified peripheral vascular diseases: Secondary | ICD-10-CM | POA: Diagnosis not present

## 2016-11-25 DIAGNOSIS — M79605 Pain in left leg: Secondary | ICD-10-CM | POA: Diagnosis not present

## 2016-11-25 DIAGNOSIS — I7389 Other specified peripheral vascular diseases: Secondary | ICD-10-CM | POA: Diagnosis not present

## 2016-11-26 DIAGNOSIS — I82412 Acute embolism and thrombosis of left femoral vein: Secondary | ICD-10-CM | POA: Diagnosis not present

## 2016-11-27 DIAGNOSIS — D6489 Other specified anemias: Secondary | ICD-10-CM | POA: Diagnosis not present

## 2016-11-27 DIAGNOSIS — I82402 Acute embolism and thrombosis of unspecified deep veins of left lower extremity: Secondary | ICD-10-CM | POA: Diagnosis not present

## 2016-12-11 DIAGNOSIS — I7389 Other specified peripheral vascular diseases: Secondary | ICD-10-CM | POA: Diagnosis not present

## 2016-12-11 DIAGNOSIS — M25571 Pain in right ankle and joints of right foot: Secondary | ICD-10-CM | POA: Diagnosis not present

## 2016-12-11 DIAGNOSIS — I82402 Acute embolism and thrombosis of unspecified deep veins of left lower extremity: Secondary | ICD-10-CM | POA: Diagnosis not present

## 2016-12-11 DIAGNOSIS — S81802D Unspecified open wound, left lower leg, subsequent encounter: Secondary | ICD-10-CM | POA: Diagnosis not present

## 2017-01-06 ENCOUNTER — Other Ambulatory Visit (INDEPENDENT_AMBULATORY_CARE_PROVIDER_SITE_OTHER): Payer: Self-pay

## 2017-01-14 DIAGNOSIS — M6281 Muscle weakness (generalized): Secondary | ICD-10-CM | POA: Diagnosis not present

## 2017-01-15 DIAGNOSIS — M6281 Muscle weakness (generalized): Secondary | ICD-10-CM | POA: Diagnosis not present

## 2017-01-16 DIAGNOSIS — M6281 Muscle weakness (generalized): Secondary | ICD-10-CM | POA: Diagnosis not present

## 2017-01-18 DIAGNOSIS — M6281 Muscle weakness (generalized): Secondary | ICD-10-CM | POA: Diagnosis not present

## 2017-01-20 DIAGNOSIS — M6281 Muscle weakness (generalized): Secondary | ICD-10-CM | POA: Diagnosis not present

## 2017-01-21 DIAGNOSIS — M6281 Muscle weakness (generalized): Secondary | ICD-10-CM | POA: Diagnosis not present

## 2017-01-22 DIAGNOSIS — M6281 Muscle weakness (generalized): Secondary | ICD-10-CM | POA: Diagnosis not present

## 2017-01-23 DIAGNOSIS — M6281 Muscle weakness (generalized): Secondary | ICD-10-CM | POA: Diagnosis not present

## 2017-01-26 DIAGNOSIS — M6281 Muscle weakness (generalized): Secondary | ICD-10-CM | POA: Diagnosis not present

## 2017-01-26 IMAGING — CR DG CHEST 2V
2 series · 3 of 3 positions shown · non-contrast
Comparison: 11/07/2015

CLINICAL DATA: Cough, above the knee left knee amputation [REDACTED]

EXAM:
CHEST  2 VIEW

[Series 2: chest lat · 0.14mm/px · 2 of 2 slices shown]
[im 1/2]
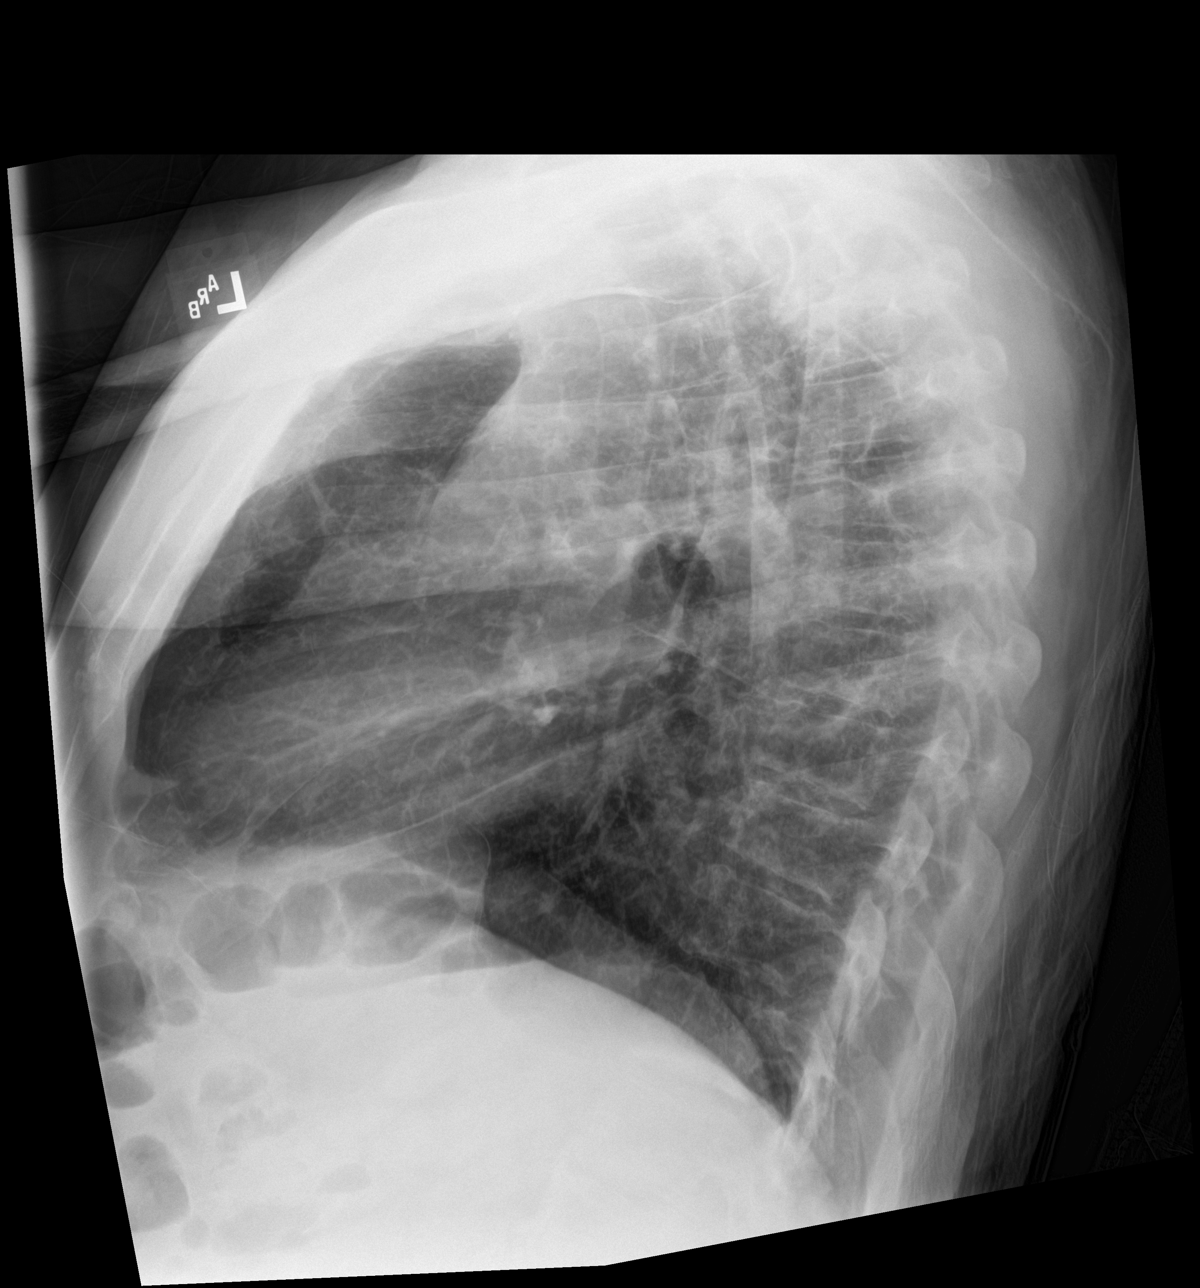
[im 2/2]
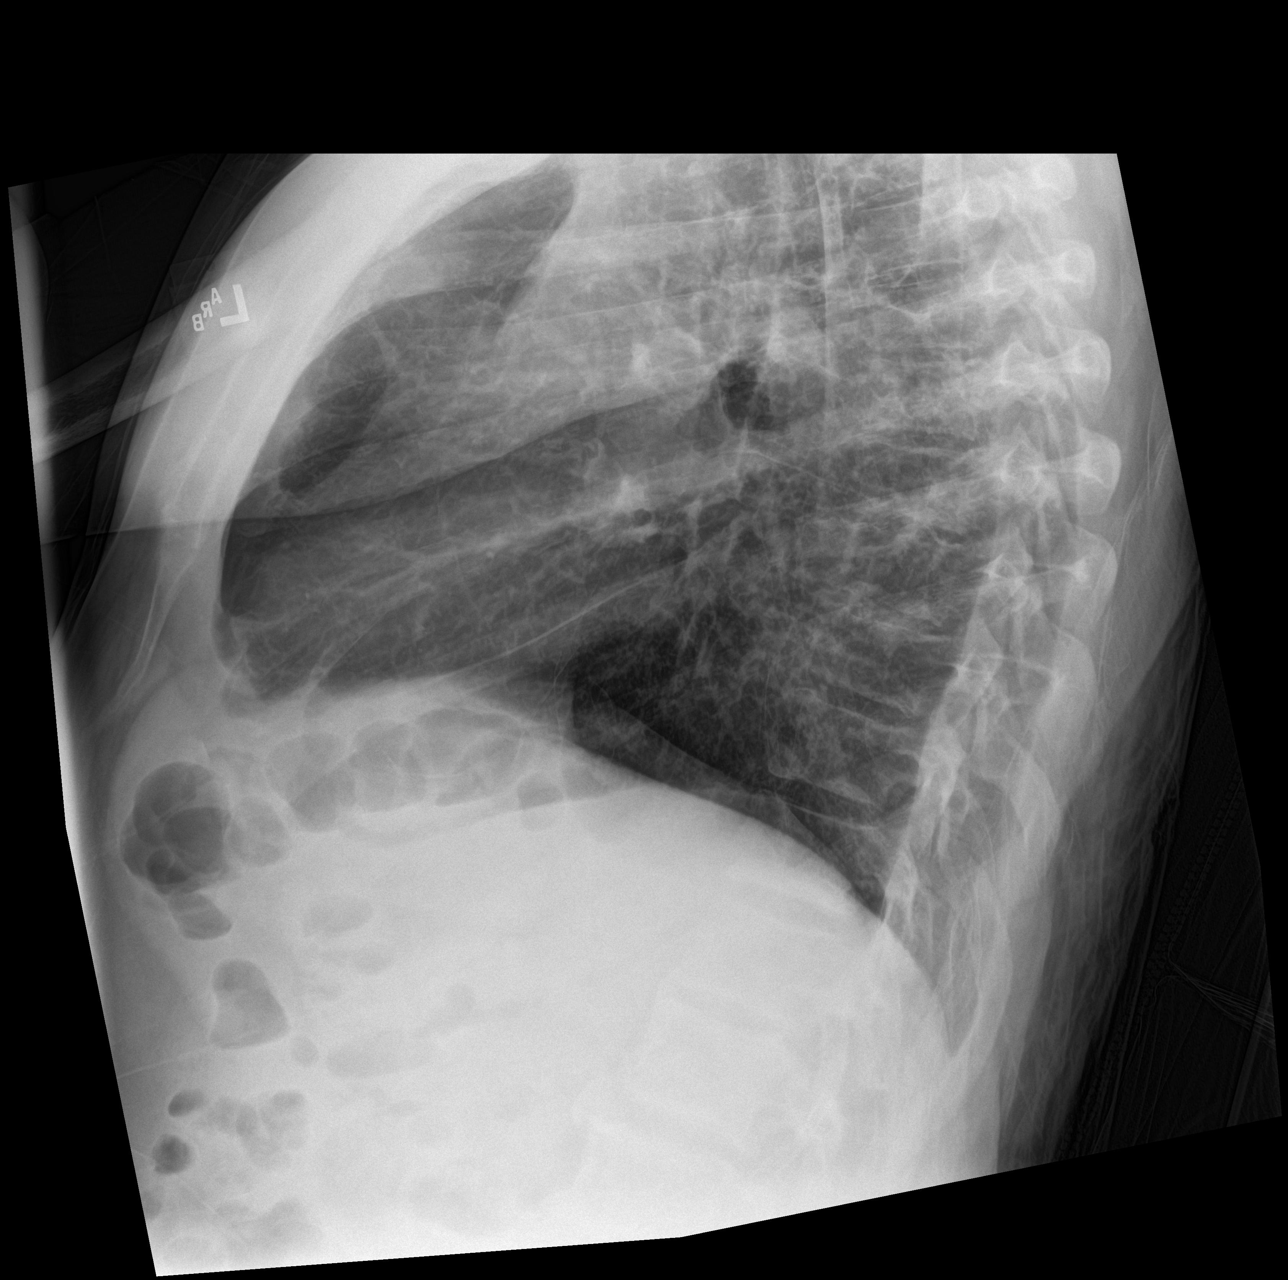

[chest ap]
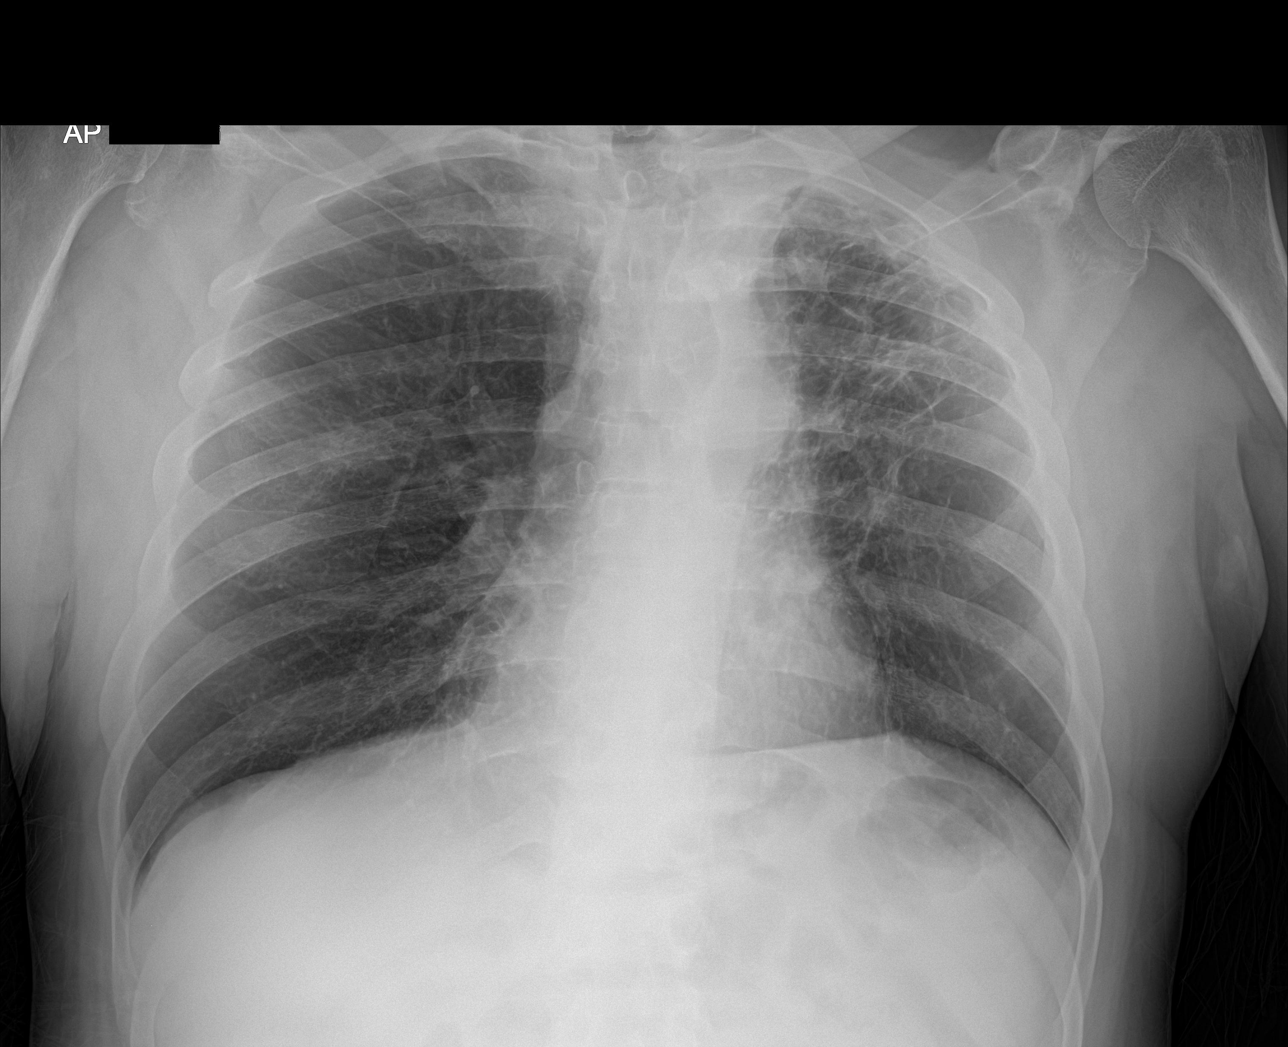

[3 of 3 positions shown; findings below may reference images not displayed]

FINDINGS: Cardiomediastinal silhouette is stable. Stable bullous changes and
scarring in left upper lobe. No definite superimposed infiltrate or
pulmonary edema. Bony thorax is stable.
IMPRESSION: Stable bullous changes and scarring in left upper lobe. No definite
superimposed infiltrate or pulmonary edema.

## 2017-01-27 DIAGNOSIS — M6281 Muscle weakness (generalized): Secondary | ICD-10-CM | POA: Diagnosis not present

## 2017-01-28 DIAGNOSIS — M6281 Muscle weakness (generalized): Secondary | ICD-10-CM | POA: Diagnosis not present

## 2017-01-29 DIAGNOSIS — I82402 Acute embolism and thrombosis of unspecified deep veins of left lower extremity: Secondary | ICD-10-CM | POA: Diagnosis not present

## 2017-01-29 DIAGNOSIS — M6281 Muscle weakness (generalized): Secondary | ICD-10-CM | POA: Diagnosis not present

## 2017-01-29 DIAGNOSIS — I7389 Other specified peripheral vascular diseases: Secondary | ICD-10-CM | POA: Diagnosis not present

## 2017-01-29 DIAGNOSIS — I5189 Other ill-defined heart diseases: Secondary | ICD-10-CM | POA: Diagnosis not present

## 2017-01-29 DIAGNOSIS — J984 Other disorders of lung: Secondary | ICD-10-CM | POA: Diagnosis not present

## 2017-02-01 DIAGNOSIS — M6281 Muscle weakness (generalized): Secondary | ICD-10-CM | POA: Diagnosis not present

## 2017-02-02 DIAGNOSIS — M6281 Muscle weakness (generalized): Secondary | ICD-10-CM | POA: Diagnosis not present

## 2017-02-03 DIAGNOSIS — M6281 Muscle weakness (generalized): Secondary | ICD-10-CM | POA: Diagnosis not present

## 2017-02-04 DIAGNOSIS — M6281 Muscle weakness (generalized): Secondary | ICD-10-CM | POA: Diagnosis not present

## 2017-02-05 DIAGNOSIS — I7389 Other specified peripheral vascular diseases: Secondary | ICD-10-CM | POA: Diagnosis not present

## 2017-02-05 DIAGNOSIS — M6281 Muscle weakness (generalized): Secondary | ICD-10-CM | POA: Diagnosis not present

## 2017-02-05 DIAGNOSIS — I5189 Other ill-defined heart diseases: Secondary | ICD-10-CM | POA: Diagnosis not present

## 2017-02-06 DIAGNOSIS — M6281 Muscle weakness (generalized): Secondary | ICD-10-CM | POA: Diagnosis not present

## 2017-02-06 DIAGNOSIS — I10 Essential (primary) hypertension: Secondary | ICD-10-CM | POA: Diagnosis not present

## 2017-02-09 DIAGNOSIS — M6281 Muscle weakness (generalized): Secondary | ICD-10-CM | POA: Diagnosis not present

## 2017-02-10 DIAGNOSIS — M6281 Muscle weakness (generalized): Secondary | ICD-10-CM | POA: Diagnosis not present

## 2017-02-22 DIAGNOSIS — D649 Anemia, unspecified: Secondary | ICD-10-CM | POA: Diagnosis not present

## 2017-02-22 DIAGNOSIS — I509 Heart failure, unspecified: Secondary | ICD-10-CM | POA: Diagnosis not present

## 2017-02-22 DIAGNOSIS — I4891 Unspecified atrial fibrillation: Secondary | ICD-10-CM | POA: Diagnosis not present

## 2017-03-03 DIAGNOSIS — I82412 Acute embolism and thrombosis of left femoral vein: Secondary | ICD-10-CM | POA: Diagnosis not present

## 2017-03-05 DIAGNOSIS — I82402 Acute embolism and thrombosis of unspecified deep veins of left lower extremity: Secondary | ICD-10-CM | POA: Diagnosis not present

## 2017-03-11 ENCOUNTER — Encounter: Payer: Self-pay | Admitting: Oncology

## 2017-03-11 ENCOUNTER — Inpatient Hospital Stay: Payer: Medicare Other | Attending: Oncology | Admitting: Oncology

## 2017-03-11 ENCOUNTER — Other Ambulatory Visit: Payer: Self-pay | Admitting: *Deleted

## 2017-03-11 ENCOUNTER — Inpatient Hospital Stay: Payer: Medicare Other

## 2017-03-11 VITALS — BP 117/66 | HR 53 | Temp 96.7°F | Wt 185.0 lb

## 2017-03-11 DIAGNOSIS — I739 Peripheral vascular disease, unspecified: Secondary | ICD-10-CM | POA: Diagnosis not present

## 2017-03-11 DIAGNOSIS — D509 Iron deficiency anemia, unspecified: Secondary | ICD-10-CM

## 2017-03-11 DIAGNOSIS — I82409 Acute embolism and thrombosis of unspecified deep veins of unspecified lower extremity: Secondary | ICD-10-CM | POA: Diagnosis not present

## 2017-03-11 DIAGNOSIS — Z7982 Long term (current) use of aspirin: Secondary | ICD-10-CM | POA: Insufficient documentation

## 2017-03-11 DIAGNOSIS — Z89512 Acquired absence of left leg below knee: Secondary | ICD-10-CM | POA: Diagnosis not present

## 2017-03-11 DIAGNOSIS — Z87891 Personal history of nicotine dependence: Secondary | ICD-10-CM | POA: Diagnosis not present

## 2017-03-11 DIAGNOSIS — Z86718 Personal history of other venous thrombosis and embolism: Secondary | ICD-10-CM

## 2017-03-11 DIAGNOSIS — Z720 Tobacco use: Secondary | ICD-10-CM

## 2017-03-11 DIAGNOSIS — Z7901 Long term (current) use of anticoagulants: Secondary | ICD-10-CM | POA: Diagnosis not present

## 2017-03-11 DIAGNOSIS — J449 Chronic obstructive pulmonary disease, unspecified: Secondary | ICD-10-CM | POA: Diagnosis not present

## 2017-03-11 DIAGNOSIS — I509 Heart failure, unspecified: Secondary | ICD-10-CM | POA: Insufficient documentation

## 2017-03-11 DIAGNOSIS — G4700 Insomnia, unspecified: Secondary | ICD-10-CM | POA: Diagnosis not present

## 2017-03-11 DIAGNOSIS — I110 Hypertensive heart disease with heart failure: Secondary | ICD-10-CM | POA: Diagnosis not present

## 2017-03-11 LAB — IRON AND TIBC
Iron: 49 ug/dL (ref 45–182)
SATURATION RATIOS: 14 % — AB (ref 17.9–39.5)
TIBC: 349 ug/dL (ref 250–450)
UIBC: 300 ug/dL

## 2017-03-11 LAB — CBC WITH DIFFERENTIAL/PLATELET
BASOS ABS: 0.1 10*3/uL (ref 0–0.1)
BASOS PCT: 1 %
EOS ABS: 0.3 10*3/uL (ref 0–0.7)
Eosinophils Relative: 3 %
HCT: 38.3 % — ABNORMAL LOW (ref 40.0–52.0)
HEMOGLOBIN: 12.6 g/dL — AB (ref 13.0–18.0)
Lymphocytes Relative: 22 %
Lymphs Abs: 2.9 10*3/uL (ref 1.0–3.6)
MCH: 25.5 pg — ABNORMAL LOW (ref 26.0–34.0)
MCHC: 33 g/dL (ref 32.0–36.0)
MCV: 77.3 fL — ABNORMAL LOW (ref 80.0–100.0)
Monocytes Absolute: 0.8 10*3/uL (ref 0.2–1.0)
Monocytes Relative: 6 %
NEUTROS ABS: 8.7 10*3/uL — AB (ref 1.4–6.5)
NEUTROS PCT: 68 %
Platelets: 319 10*3/uL (ref 150–440)
RBC: 4.95 MIL/uL (ref 4.40–5.90)
RDW: 16.7 % — ABNORMAL HIGH (ref 11.5–14.5)
WBC: 12.8 10*3/uL — AB (ref 3.8–10.6)

## 2017-03-11 LAB — COMPREHENSIVE METABOLIC PANEL
ALBUMIN: 4.2 g/dL (ref 3.5–5.0)
ALT: 22 U/L (ref 17–63)
AST: 24 U/L (ref 15–41)
Alkaline Phosphatase: 96 U/L (ref 38–126)
Anion gap: 7 (ref 5–15)
BUN: 18 mg/dL (ref 6–20)
CHLORIDE: 101 mmol/L (ref 101–111)
CO2: 29 mmol/L (ref 22–32)
CREATININE: 1.13 mg/dL (ref 0.61–1.24)
Calcium: 9.1 mg/dL (ref 8.9–10.3)
GFR calc Af Amer: >60 mL/min (ref >60)
GFR calc non Af Amer: >60 mL/min (ref >60)
GLUCOSE: 82 mg/dL (ref 65–99)
Potassium: 3.5 mmol/L (ref 3.5–5.1)
SODIUM: 137 mmol/L (ref 135–145)
Total Bilirubin: 0.7 mg/dL (ref 0.3–1.2)
Total Protein: 8.3 g/dL — ABNORMAL HIGH (ref 6.5–8.1)

## 2017-03-11 LAB — LACTATE DEHYDROGENASE: LDH: 142 U/L (ref 98–192)

## 2017-03-11 LAB — FERRITIN: FERRITIN: 40 ng/mL (ref 24–336)

## 2017-03-11 NOTE — Progress Notes (Signed)
[facility] Initial Visit:  Patient Care Team: Alvester Morin, MD as PCP - General (Family Medicine)  CHIEF COMPLAINTS/PURPOSE OF CONSULTATION: Recurrent DVT  HISTORY OF PRESENTING ILLNESS: Timothy Juarez 66 y.o. male with PMH listed as below is here for evaluation of recurrent DVT and management. Patient is accompanied by RN from [facility].  Patient is a poor historian. He remembers having leg clots for more than one time. He remembers taking Eliquis in the past and recently his anticoagulation has been switched to [place].  Reviewed the medical records from nursing home by patient's PCP Dr.Slade-Hartman, patient had been on Eliquis 38m BID for previous DVTs. His medical records showed he had acute DVT at the left common femoral vein on 11/26/2016, and he was placed on Eliquis 2.57mBID. Later when another venous doppler was obtained on 02/15/2017 due to persistent pain and swelling, doppler showed persist deep vein thrombosis involving proxima to distal femoral vein. Common femoral vein has normal compression. There was a note on the second doppler report on 03/03/2017 that patient was taking Eliquis 110m68mID, which was stopped on 7/19 and patient was placed on Lovenox. Patient had swelling and pain of his left stump and symptoms does not improve with being on Lovenox. Xarelto 1110m71mD was started on 03/05/2017 and Lovenox was discontinued on 03/09/2017 with a plan to switch to Xarelto 20mg54mly after 21 days.  It was mentioned in nursing home note that patient had been on Warfarin previously and acquired blood clot while on warfarin. Patient does not remember this and the details of his?warfarin resistance is unclear.  Patient reports he still have pain at his left stump.      Review of Systems  Constitutional: Negative.   HENT:  Negative.   Eyes: Negative.   Respiratory: Negative.   Cardiovascular: Negative.   Gastrointestinal:  Negative.   Endocrine: Negative.   Genitourinary: Negative.    Musculoskeletal:       Left stump pain and swelling.   Skin: Negative.   Neurological: Negative.   Hematological: Negative.   Psychiatric/Behavioral: Negative.     MEDICAL HISTORY: Past Medical History:  Diagnosis Date  . Allergy   . Anemia   . ARF (acute respiratory failure) (HCC)    H/O  . CHF (congestive heart failure) (HCC) [place] COPD (chronic obstructive pulmonary disease) (HCC) [place] Hypertension   . Hypokalemia   . Insomnia   . Muscle contracture    MUSCLE SPASMS  . Peripheral vascular disease (HCC) [place] Pressure ulcer     SURGICAL HISTORY: Past Surgical History:  Procedure Laterality Date  . AMPUTATION Left 09/19/2015   Procedure: AMPUTATION BELOW KNEE;  Surgeon: JasonAlgernon Huxley  Location: [facility];  Service: Vascular;  Laterality: Left;  . AMPUTATION Left 11/01/2015   Procedure: AMPUTATION ABOVE KNEE;  Surgeon: JasonAlgernon Huxley  Location: [facility];  Service: Vascular;  Laterality: Left;  . APPLICATION OF WOUND VAC Left 10/18/2015   Procedure: APPLICATION OF WOUND VAC;  Surgeon: JasonAlgernon Huxley  Location: [facility];  Service: Vascular;  Laterality: Left;  . PERIPHERAL VASCULAR CATHETERIZATION Left 01/18/2015   Procedure: Lower Extremity Angiography;  Surgeon: JasonAlgernon Huxley  [facility];  Service: Cardiovascular;  Laterality: Left;  . PERIPHERAL VASCULAR CATHETERIZATION N/A 01/18/2015   Procedure: Lower Extremity Intervention;  Surgeon: JasonAlgernon Huxley  [facility];  Service: Cardiovascular;  Laterality: N/A;  . PERIPHERAL VASCULAR CATHETERIZATION  07/30/2015   Procedure: Lower Extremity Intervention;  Surgeon: Algernon Huxley, MD;  Location: [place] [facility];  Service: Cardiovascular;;  . PERIPHERAL VASCULAR CATHETERIZATION N/A 07/30/2015   Procedure: Abdominal Aortogram w/Lower Extremity;  Surgeon: Algernon Huxley, MD;  Location: [place] [facility];  Service: Cardiovascular;   Laterality: N/A;  . PERIPHERAL VASCULAR CATHETERIZATION Left 08/22/2015   Procedure: Lower Extremity Angiography;  Surgeon: Algernon Huxley, MD;  Location: [place] [facility];  Service: Cardiovascular;  Laterality: Left;  . PERIPHERAL VASCULAR CATHETERIZATION Left 08/22/2015   Procedure: Lower Extremity Intervention;  Surgeon: Algernon Huxley, MD;  Location: [place] [facility];  Service: Cardiovascular;  Laterality: Left;  . PERIPHERAL VASCULAR CATHETERIZATION Right 03/31/2016   Procedure: Lower Extremity Angiography;  Surgeon: Algernon Huxley, MD;  Location: [place] [facility];  Service: Cardiovascular;  Laterality: Right;  . PERIPHERAL VASCULAR CATHETERIZATION  03/31/2016   Procedure: Lower Extremity Intervention;  Surgeon: Algernon Huxley, MD;  Location: [place] [facility];  Service: Cardiovascular;;  . PERIPHERAL VASCULAR CATHETERIZATION Left 04/10/2016   Procedure: Lower Extremity Angiography;  Surgeon: Algernon Huxley, MD;  Location: [place] [facility];  Service: Cardiovascular;  Laterality: Left;  . WOUND DEBRIDEMENT Left 10/18/2015   Procedure: DEBRIDEMENT WOUND   ( LEFT BKA DEBRIDEMENT );  Surgeon: Algernon Huxley, MD;  Location: [facility];  Service: Vascular;  Laterality: Left;    SOCIAL HISTORY: Social History   Social History  . Marital status: Legally Separated    Spouse name: N/A  . Number of children: N/A  . Years of education: N/A   Occupational History  . Not on file.   Social History Main Topics  . Smoking status: Former Smoker    Packs/day: 1.00    Years: 44.00    Types: Cigarettes    Quit date: 11/17/2012  . Smokeless tobacco: Never Used  . Alcohol use No  . Drug use: No  . Sexual activity: Not on file   Other Topics Concern  . Not on file   Social History Narrative  . No narrative on file    FAMILY HISTORY History reviewed. No pertinent family history.  ALLERGIES:  has No Known Allergies.  MEDICATIONS:  Current Outpatient Prescriptions  Medication Sig Dispense Refill   . acetaminophen (TYLENOL) 325 MG tablet Take 650 mg by mouth at bedtime.     [name] ADVAIR DISKUS 250-50 MCG/DOSE AEPB     . aspirin EC 81 MG EC tablet Take 1 tablet (81 mg total) by mouth daily. 30 tablet 11  . baclofen (LIORESAL) 10 MG tablet Take 10 mg by mouth 3 (three) times daily. Hold for sedation    . fentaNYL (DURAGESIC - DOSED MCG/HR) 12 MCG/HR     . furosemide (LASIX) 40 MG tablet Take 40 mg by mouth 2 (two) times daily.     [name] gabapentin (NEURONTIN) 100 MG capsule Take 100 mg by mouth at bedtime.    . iron polysaccharides (NIFEREX) 150 MG capsule Take 150 mg by mouth daily.    . Melatonin 5 MG CAPS Take 1 capsule by mouth at bedtime as needed.    . metoprolol tartrate (LOPRESSOR) 25 MG tablet Take 0.5 tablets (12.5 mg total) by mouth 2 (two) times daily. 60 tablet 5  . Omega-3 Fatty Acids (FISH OIL) 1000 MG CAPS Take 1,000 mg by mouth every morning.    . Oxycodone HCl 10 MG TABS  Take 10 mg by mouth 3 (three) times daily.    . potassium chloride (K-DUR) 10 MEQ tablet Take 20 mEq by mouth daily.     [name] tiotropium (SPIRIVA) 18 MCG inhalation capsule Place 18 mcg into inhaler and inhale daily.     No current facility-administered medications for this visit.     PHYSICAL EXAMINATION:  ECOG PERFORMANCE STATUS: 1 - Symptomatic but completely ambulatory   Vitals:   03/11/17 1355  BP: 117/66  Pulse: (!) 53  Temp: (!) 96.7 F (35.9 C)    Filed Weights   03/11/17 1355  Weight: 185 lb (83.9 kg)     Physical Exam GENERAL: No distress, well nourished.  SKIN:  No rashes or significant lesions  HEAD: Normocephalic, No masses, lesions, tenderness or abnormalities  EYES: Conjunctiva are pink, non icteric ENT: External ears normal ,lips , buccal mucosa, and tongue normal and mucous membranes are moist  LYMPH: No palpable cervical and axillary lymphadenopathy  LUNGS: Clear to auscultation, no crackles or wheezes HEART: Regular rate & rhythm, no murmurs, no gallops, S1 normal and S2  normal  ABDOMEN: Abdomen soft, non-tender, normal bowel sounds, I did not appreciate any  masses or organomegaly  MUSCULOSKELETAL: No CVA tenderness and no tenderness on percussion of the back or rib cage.  EXTREMITIES: left above knee amputation stump swelling and tender. No edema at right lower extremity.  NEURO: Alert & oriented, no focal motor/sensory deficits.   LABORATORY DATA: I have personally reviewed the data as listed:  Appointment on 03/11/2017  Component Date Value Ref Range Status  . WBC 03/11/2017 12.8* 3.8 - 10.6 K/uL Final  . RBC 03/11/2017 4.95  4.40 - 5.90 MIL/uL Final  . Hemoglobin 03/11/2017 12.6* 13.0 - 18.0 g/dL Final  . HCT 03/11/2017 38.3* 40.0 - 52.0 % Final  . MCV 03/11/2017 77.3* 80.0 - 100.0 fL Final  . MCH 03/11/2017 25.5* 26.0 - 34.0 pg Final  . MCHC 03/11/2017 33.0  32.0 - 36.0 g/dL Final  . RDW 03/11/2017 16.7* 11.5 - 14.5 % Final  . Platelets 03/11/2017 319  150 - 440 K/uL Final  . Neutrophils Relative % 03/11/2017 68  % Final  . Neutro Abs 03/11/2017 8.7* 1.4 - 6.5 K/uL Final  . Lymphocytes Relative 03/11/2017 22  % Final  . Lymphs Abs 03/11/2017 2.9  1.0 - 3.6 K/uL Final  . Monocytes Relative 03/11/2017 6  % Final  . Monocytes Absolute 03/11/2017 0.8  0.2 - 1.0 K/uL Final  . Eosinophils Relative 03/11/2017 3  % Final  . Eosinophils Absolute 03/11/2017 0.3  0 - 0.7 K/uL Final  . Basophils Relative 03/11/2017 1  % Final  . Basophils Absolute 03/11/2017 0.1  0 - 0.1 K/uL Final  . LDH 03/11/2017 142  98 - 192 U/L Final  . Sodium 03/11/2017 137  135 - 145 mmol/L Final  . Potassium 03/11/2017 3.5  3.5 - 5.1 mmol/L Final  . Chloride 03/11/2017 101  101 - 111 mmol/L Final  . CO2 03/11/2017 29  22 - 32 mmol/L Final  . Glucose, Bld 03/11/2017 82  65 - 99 mg/dL Final  . BUN 03/11/2017 18  6 - 20 mg/dL Final  . Creatinine, Ser 03/11/2017 1.13  0.61 - 1.24 mg/dL Final  . Calcium 03/11/2017 9.1  8.9 - 10.3 mg/dL Final  . Total Protein 03/11/2017 8.3* 6.5  - 8.1 g/dL Final  . Albumin 03/11/2017 4.2  3.5 - 5.0 g/dL Final  . AST 03/11/2017 24  15 -  41 U/L Final  . ALT 03/11/2017 22  17 - 63 U/L Final  . Alkaline Phosphatase 03/11/2017 96  38 - 126 U/L Final  . Total Bilirubin 03/11/2017 0.7  0.3 - 1.2 mg/dL Final  . GFR calc non Af Amer 03/11/2017 >60  >60 mL/min Final  . GFR calc Af Amer 03/11/2017 >60  >60 mL/min Final   Comment: (NOTE) The eGFR has been calculated using the CKD EPI equation. This calculation has not been validated in all clinical situations. eGFR's persistently <60 mL/min signify possible Chronic Kidney Disease.   . Anion gap 03/11/2017 7  5 - 15 Final    RADIOGRAPHIC STUDIES: I have personally reviewed the radiological images as listed and agree with the findings in the report 04/03/2017 US venous doppler IMPRESSION: Left inguinal/femoral partially thrombosed pseudoaneurysm/hematoma measuring 15 x 5.3 x 6.8 cm.  Reviewed image records from [facility] 11/26/2016 patient had a venous Doppler extremity left done which showed no occluding acute DVT at the left common femoral vein. 03/03/2017 patient had another venous Doppler extremity left which showed deep vein thromboses involving the proximal to distal femoral vein the common femoral artery now has normal compression.  04/05/2013 CT chest with contrast    Chronic pulmonary disease with areas of atelectasis and bronchiectasis. Continued significant opacification and bullous change in the left lung especially in the upper lobe and superior segment of the  left lower lobe with other patchy areas bilaterally. There may be mild interval improvement in the area of the lower lingula. Malignancy cannot be excluded in the abnormal areas of the lungs. No central pulmonary arterial filling defect is seen. The bolus is not optimal for evaluation of the lungs    otherwise.  ASSESSMENT/PLAN 1. Recurrent deep vein thrombosis (DVT) ([place])   2. Tobacco abuse    3. PAD (peripheral artery disease) ([place])   4. Status post below knee amputation of left lower extremity ([place])   5. Microcytic anemia   . #check hypercoagulable state work up. Can not exclude underlying malignancy. Check CBC, CMP today. And will order CT chest w contrast at next visit. Check iron TIBC and ferritin.  Recommend vascular surgery consultation. Will consider contrast CT scans to visualize if any vascular stasis caused by anatomy.    Orders Placed This Encounter  Procedures  . ANTIPHOSPHOLIPID SYNDROME PROF    Standing Status:   Future    Number of Occurrences:   1    Standing Expiration Date:   03/11/2018  . Prothrombin gene mutation    Standing Status:   Future    Number of Occurrences:   1    Standing Expiration Date:   03/11/2018  . Factor 5 leiden    Standing Status:   Future    Number of Occurrences:   1    Standing Expiration Date:   03/11/2018  . Protein S activity    Standing Status:   Future    Number of Occurrences:   1    Standing Expiration Date:   03/11/2018  . Protein C activity    Standing Status:   Future    Number of Occurrences:   1    Standing Expiration Date:   03/11/2018  . CBC with Differential/Platelet    Standing Status:   Future    Number of Occurrences:   1    Standing Expiration Date:   03/11/2018  . Lactate dehydrogenase    Standing Status:   Future  Number of Occurrences:   1    Standing Expiration Date:   03/11/2018  . Comprehensive metabolic panel    Standing Status:   Future    Number of Occurrences:   1    Standing Expiration Date:   03/11/2018    All questions were answered. The patient knows to call the clinic with any problems, questions or concerns. See me in 3 weeks.   Earlie Server, MD  03/11/2017 2:16 PM

## 2017-03-13 ENCOUNTER — Encounter (INDEPENDENT_AMBULATORY_CARE_PROVIDER_SITE_OTHER): Payer: Self-pay | Admitting: Vascular Surgery

## 2017-03-13 ENCOUNTER — Ambulatory Visit (INDEPENDENT_AMBULATORY_CARE_PROVIDER_SITE_OTHER): Payer: Medicare Other | Admitting: Vascular Surgery

## 2017-03-13 VITALS — BP 136/60 | HR 67 | Resp 16 | Wt 185.0 lb

## 2017-03-13 DIAGNOSIS — I82409 Acute embolism and thrombosis of unspecified deep veins of unspecified lower extremity: Secondary | ICD-10-CM

## 2017-03-13 DIAGNOSIS — I70235 Atherosclerosis of native arteries of right leg with ulceration of other part of foot: Secondary | ICD-10-CM

## 2017-03-13 DIAGNOSIS — E785 Hyperlipidemia, unspecified: Secondary | ICD-10-CM

## 2017-03-13 DIAGNOSIS — I739 Peripheral vascular disease, unspecified: Secondary | ICD-10-CM

## 2017-03-13 DIAGNOSIS — I10 Essential (primary) hypertension: Secondary | ICD-10-CM

## 2017-03-13 DIAGNOSIS — I724 Aneurysm of artery of lower extremity: Secondary | ICD-10-CM | POA: Diagnosis not present

## 2017-03-13 LAB — ANTIPHOSPHOLIPID SYNDROME PROF
Anticardiolipin IgM: <9 MPL U/mL (ref 0–12)
DRVVT: 126.3 s — AB (ref 0.0–47.0)
PTT LA: 61.2 s — AB (ref 0.0–51.9)

## 2017-03-13 LAB — DRVVT CONFIRM: dRVVT Confirm: 1.6 ratio — ABNORMAL HIGH (ref 0.8–1.2)

## 2017-03-13 LAB — PTT-LA MIX: PTT-LA MIX: 52.3 s — AB (ref 0.0–48.9)

## 2017-03-13 LAB — PROTEIN S ACTIVITY: PROTEIN S ACTIVITY: 121 % (ref 63–140)

## 2017-03-13 LAB — DRVVT MIX: dRVVT Mix: 97.3 s — ABNORMAL HIGH (ref 0.0–47.0)

## 2017-03-13 LAB — HEXAGONAL PHASE PHOSPHOLIPID: HEXAGONAL PHASE PHOSPHOLIPID: 1 s (ref 0–11)

## 2017-03-13 LAB — PROTEIN C ACTIVITY: Protein C Activity: 142 % (ref 73–180)

## 2017-03-13 NOTE — Assessment & Plan Note (Signed)
blood pressure control important in reducing the progression of atherosclerotic disease. On appropriate oral medications.  

## 2017-03-13 NOTE — Progress Notes (Signed)
MRN : [id number]  Timothy Juarez is a 66 y.o. (February 10, 1951) male who presents with chief complaint of  Chief Complaint  Patient presents with  . DVT  .  History of Present Illness: Patient returns today in follow up Today at the request of his hematologist for a recurrent left lower extremity DVT. This is second DVT and he is back on anticoagulation which she is tolerating well. This is in his amputated leg. He is status post left above-knee amputation previously. He was walking with his prosthesis until he began developing pain and swelling in that stump and the swelling is quite prominent. This is the second time it has now happened past year. He is scheduled to have a CT scan of the abdomen and pelvis a couple of weeks requesting his hematologist. He still has a lot of pain. His activity is minimal malady cannot wear his prosthesis. It is difficult to elevate the stump. He does not have open ulceration or infection. He has no fever or chills.  Current Outpatient Prescriptions  Medication Sig Dispense Refill  . acetaminophen (TYLENOL) 325 MG tablet Take 650 mg by mouth at bedtime.     [name] allopurinol (ZYLOPRIM) 100 MG tablet Take 100 mg by mouth daily.    [name] aspirin EC 81 MG EC tablet Take 1 tablet (81 mg total) by mouth daily. 30 tablet 11  . baclofen (LIORESAL) 10 MG tablet Take 10 mg by mouth 3 (three) times daily. Hold for sedation    . budesonide-formoterol (SYMBICORT) 80-4.5 MCG/ACT inhaler Inhale 2 puffs into the lungs 2 (two) times daily.    . fentaNYL (DURAGESIC - DOSED MCG/HR) 12 MCG/HR     . furosemide (LASIX) 40 MG tablet Take 40 mg by mouth 2 (two) times daily.     [name] gabapentin (NEURONTIN) 100 MG capsule Take 100 mg by mouth at bedtime.    . iron polysaccharides (NIFEREX) 150 MG capsule Take 150 mg by mouth daily.    . Melatonin 5 MG CAPS Take 1 capsule by mouth at bedtime as needed.    . metoprolol tartrate (LOPRESSOR) 25 MG tablet Take 0.5 tablets (12.5 mg total) by mouth 2  (two) times daily. 60 tablet 5  . Omega-3 Fatty Acids (FISH OIL) 1000 MG CAPS Take 1,000 mg by mouth every morning.    . Oxycodone HCl 10 MG TABS Take 10 mg by mouth 3 (three) times daily.    . potassium chloride (K-DUR) 10 MEQ tablet Take 20 mEq by mouth daily.     . Rivaroxaban (XARELTO) 15 MG TABS tablet Take 15 mg by mouth 2 (two) times daily with a meal.    . tiotropium (SPIRIVA) 18 MCG inhalation capsule Place 18 mcg into inhaler and inhale daily.    [name] ADVAIR DISKUS 250-50 MCG/DOSE AEPB      No current facility-administered medications for this visit.     Past Medical History:  Diagnosis Date  . Allergy   . Anemia   . ARF (acute respiratory failure) (HCC)    H/O  . CHF (congestive heart failure) ([place])   . COPD (chronic obstructive pulmonary disease) ([place])   . Hypertension   . Hypokalemia   . Insomnia   . Muscle contracture    MUSCLE SPASMS  . Peripheral vascular disease ([place])   . Pressure ulcer     Past Surgical History:  Procedure Laterality Date  . AMPUTATION Left 09/19/2015   Procedure: AMPUTATION BELOW KNEE;  Surgeon: Algernon Huxley,  MD;  Location: [facility];  Service: Vascular;  Laterality: Left;  . AMPUTATION Left 11/01/2015   Procedure: AMPUTATION ABOVE KNEE;  Surgeon: Algernon Huxley, MD;  Location: [facility];  Service: Vascular;  Laterality: Left;  . APPLICATION OF WOUND VAC Left 10/18/2015   Procedure: APPLICATION OF WOUND VAC;  Surgeon: Algernon Huxley, MD;  Location: [facility];  Service: Vascular;  Laterality: Left;  . PERIPHERAL VASCULAR CATHETERIZATION Left 01/18/2015   Procedure: Lower Extremity Angiography;  Surgeon: Algernon Huxley, MD;  Location: [place];  Service: Cardiovascular;  Laterality: Left;  . PERIPHERAL VASCULAR CATHETERIZATION N/A 01/18/2015   Procedure: Lower Extremity Intervention;  Surgeon: Algernon Huxley, MD;  Location: [facility];  Service: Cardiovascular;  Laterality: N/A;  . PERIPHERAL VASCULAR CATHETERIZATION  07/30/2015   Procedure: Lower  Extremity Intervention;  Surgeon: Algernon Huxley, MD;  Location: [facility];  Service: Cardiovascular;;  . PERIPHERAL VASCULAR CATHETERIZATION N/A 07/30/2015   Procedure: Abdominal Aortogram w/Lower Extremity;  Surgeon: Algernon Huxley, MD;  Location: [facility];  Service: Cardiovascular;  Laterality: N/A;  . PERIPHERAL VASCULAR CATHETERIZATION Left 08/22/2015   Procedure: Lower Extremity Angiography;  Surgeon: Algernon Huxley, MD;  Location: [facility];  Service: Cardiovascular;  Laterality: Left;  . PERIPHERAL VASCULAR CATHETERIZATION Left 08/22/2015   Procedure: Lower Extremity Intervention;  Surgeon: Algernon Huxley, MD;  Location: [facility];  Service: Cardiovascular;  Laterality: Left;  . PERIPHERAL VASCULAR CATHETERIZATION Right 03/31/2016   Procedure: Lower Extremity Angiography;  Surgeon: Algernon Huxley, MD;  Location: [place];  Service: Cardiovascular;  Laterality: Right;  . PERIPHERAL VASCULAR CATHETERIZATION  03/31/2016   Procedure: Lower Extremity Intervention;  Surgeon: Algernon Huxley, MD;  Location: [facility];  Service: Cardiovascular;;  . PERIPHERAL VASCULAR CATHETERIZATION Left 04/10/2016   Procedure: Lower Extremity Angiography;  Surgeon: Algernon Huxley, MD;  Location: [place] [facility];  Service: Cardiovascular;  Laterality: Left;  . WOUND DEBRIDEMENT Left 10/18/2015   Procedure: DEBRIDEMENT WOUND   ( LEFT BKA DEBRIDEMENT );  Surgeon: Algernon Huxley, MD;  Location: [facility];  Service: Vascular;  Laterality: Left;    Social History Social History  Substance Use Topics  . Smoking status: Former Smoker    Packs/day: 1.00    Years: 44.00    Types: Cigarettes    Quit date: 11/17/2012  . Smokeless tobacco: Never Used  . Alcohol use No  Lives in a facility  Family History No bleeding disorders, clotting disorders, autoimmune diseases, or aneurysms  No Known Allergies   REVIEW OF SYSTEMS (Negative unless checked)  Constitutional: '[]'$ Weight  loss  '[]'$ Fever  '[]'$ Chills Cardiac: '[]'$ Chest pain   '[]'$ Chest pressure   '[]'$ Palpitations   '[]'$ Shortness of breath when laying flat   '[]'$ Shortness of breath at rest   '[]'$ Shortness of breath with exertion. Vascular:  '[]'$ Pain in legs with walking   '[]'$ Pain in legs at rest   '[]'$ Pain in legs when laying flat   '[x]'$ Claudication   '[]'$ Pain in feet when walking  '[]'$ Pain in feet at rest  '[]'$ Pain in feet when laying flat   '[x]'$ History of DVT   '[]'$ Phlebitis   '[x]'$ Swelling in legs   '[]'$ Varicose veins   '[]'$ Non-healing ulcers Pulmonary:   '[]'$ Uses home oxygen   '[]'$ Productive cough   '[]'$ Hemoptysis   '[]'$ Wheeze  '[x]'$ COPD   '[]'$ Asthma Neurologic:  '[]'$ Dizziness  '[]'$ Blackouts   '[]'$ Seizures   '[]'$ History of stroke   '[]'$ History  of TIA  '[]'$ Aphasia   '[]'$ Temporary blindness   '[]'$ Dysphagia   '[]'$ Weakness or numbness in arms   '[]'$ Weakness or numbness in legs Musculoskeletal:  '[x]'$ Arthritis   '[]'$ Joint swelling   '[]'$ Joint pain   '[]'$ Low back pain Hematologic:  '[]'$ Easy bruising  '[]'$ Easy bleeding   '[]'$ Hypercoagulable state   '[]'$ Anemic   Gastrointestinal:  '[]'$ Blood in stool   '[]'$ Vomiting blood  '[]'$ Gastroesophageal reflux/heartburn   '[]'$ Abdominal pain Genitourinary:  '[]'$ Chronic kidney disease   '[]'$ Difficult urination  '[]'$ Frequent urination  '[]'$ Burning with urination   '[]'$ Hematuria Skin:  '[]'$ Rashes   '[]'$ Ulcers   '[]'$ Wounds Psychological:  '[]'$ History of anxiety   '[]'$  History of major depression.  Physical Examination  BP 136/60   Pulse 67   Resp 16   Wt 83.9 kg (185 lb)   BMI 24.41 kg/m  Gen:  WD/WN, NAD Head: [place]/AT, No temporalis wasting. Ear/Nose/Throat: Hearing grossly intact, nares w/o erythema or drainage, trachea midline Eyes: Conjunctiva clear. Sclera non-icteric Neck: Supple.  No JVD.  Pulmonary:  Good air movement, no use of accessory muscles.  Cardiac: RRR, normal S1, S2 Vascular:  Vessel Right Left  Radial Palpable Palpable                                    Musculoskeletal: M/S 5/5 throughout. Right transmetatarsal amputation and left above-knee amputation. Right  lower extremity with 1+ edema. Left lower extremity stump with 3+ edema. Neurologic: Sensation grossly intact in extremities.  Symmetrical.  Speech is fluent.  Psychiatric: Judgment intact, Mood & affect appropriate for pt's clinical situation. Dermatologic: No rashes or ulcers noted.  No cellulitis or open wounds.       Labs Recent Results (from the past 2160 hour(s))  ANTIPHOSPHOLIPID SYNDROME PROF     Status: Abnormal   Collection Time: 03/11/17  2:00 PM  Result Value Ref Range   Anticardiolipin IgG <9 0 - 14 GPL U/mL    Comment: (NOTE)                          Negative:              <15                          Indeterminate:     15 - 20                          Low-Med Positive: >20 - 80                          High Positive:         >80    Anticardiolipin IgM <9 0 - 12 MPL U/mL    Comment: (NOTE)                          Negative:              <13                          Indeterminate:     13 - 20                          Low-Med Positive: >20 -  80                          High Positive:         >80    PTT Lupus Anticoagulant 61.2 (H) 0.0 - 51.9 sec   DRVVT 126.3 (H) 0.0 - 47.0 sec   Lupus Anticoag Interp Comment:     Comment: (NOTE) Results are consistent with the presence of a lupus anticoagulant. NOTE: Only persistent lupus anticoagulants are thought to be of clinical significance. For this reason, repeat testing in 12 or more weeks after an initial positive result should be considered to confirm or refute the presence of a lupus anticoagulant, depending on clinical presentation. Results of lupus anticoagulant tests may be falsely positive in the presence of certain anticoagulant therapies. Performed At: [facility] [place], [place] [id number] Lindon Romp MD [id number]   Protein S activity     Status: None   Collection Time: 03/11/17  2:00 PM  Result Value Ref Range   Protein S Activity 121 63 - 140 %    Comment:  (NOTE) Protein S activity may be falsely increased (masking an abnormal, low result) in patients receiving direct Xa inhibitor (e.g., rivaroxaban, apixaban, edoxaban) or a direct thrombin inhibitor (e.g., dabigatran) anticoagulant treatment due to assay interference by these drugs. Performed At: [facility] [place], [place] [id number] Lindon Romp MD [id number]   Protein C activity     Status: None   Collection Time: 03/11/17  2:00 PM  Result Value Ref Range   Protein C Activity 142 73 - 180 %    Comment: (NOTE) Performed At: [facility] [place], [place] [id number] Lindon Romp MD [id number]   CBC with Differential/Platelet     Status: Abnormal   Collection Time: 03/11/17  2:00 PM  Result Value Ref Range   WBC 12.8 (H) 3.8 - 10.6 K/uL   RBC 4.95 4.40 - 5.90 MIL/uL   Hemoglobin 12.6 (L) 13.0 - 18.0 g/dL   HCT 38.3 (L) 40.0 - 52.0 %   MCV 77.3 (L) 80.0 - 100.0 fL   MCH 25.5 (L) 26.0 - 34.0 pg   MCHC 33.0 32.0 - 36.0 g/dL   RDW 16.7 (H) 11.5 - 14.5 %   Platelets 319 150 - 440 K/uL   Neutrophils Relative % 68 %   Neutro Abs 8.7 (H) 1.4 - 6.5 K/uL   Lymphocytes Relative 22 %   Lymphs Abs 2.9 1.0 - 3.6 K/uL   Monocytes Relative 6 %   Monocytes Absolute 0.8 0.2 - 1.0 K/uL   Eosinophils Relative 3 %   Eosinophils Absolute 0.3 0 - 0.7 K/uL   Basophils Relative 1 %   Basophils Absolute 0.1 0 - 0.1 K/uL  Lactate dehydrogenase     Status: None   Collection Time: 03/11/17  2:00 PM  Result Value Ref Range   LDH 142 98 - 192 U/L  Comprehensive metabolic panel     Status: Abnormal   Collection Time: 03/11/17  2:00 PM  Result Value Ref Range   Sodium 137 135 - 145 mmol/L   Potassium 3.5 3.5 - 5.1 mmol/L   Chloride 101 101 - 111 mmol/L   CO2 29 22 - 32 mmol/L   Glucose, Bld 82 65 - 99 mg/dL   BUN 18 6 - 20 mg/dL   Creatinine, Ser 1.13 0.61 - 1.24 mg/dL   Calcium 9.1 8.9 -  10.3 mg/dL   Total Protein 8.3 (H) 6.5 - 8.1  g/dL   Albumin 4.2 3.5 - 5.0 g/dL   AST 24 15 - 41 U/L   ALT 22 17 - 63 U/L   Alkaline Phosphatase 96 38 - 126 U/L   Total Bilirubin 0.7 0.3 - 1.2 mg/dL   GFR calc non Af Amer >60 >60 mL/min   GFR calc Af Amer >60 >60 mL/min    Comment: (NOTE) The eGFR has been calculated using the CKD EPI equation. This calculation has not been validated in all clinical situations. eGFR's persistently <60 mL/min signify possible Chronic Kidney Disease.    Anion gap 7 5 - 15  PTT-LA Mix     Status: Abnormal   Collection Time: 03/11/17  2:00 PM  Result Value Ref Range   PTT-LA Mix 52.3 (H) 0.0 - 48.9 sec    Comment: (NOTE) Performed At: [facility] [place], [place] [id number] Lindon Romp MD UD:[id number]   Hexagonal Phase Phospholipid     Status: None   Collection Time: 03/11/17  2:00 PM  Result Value Ref Range   Hexagonal Phase Phospholipid 1 0 - 11 sec    Comment: (NOTE) Performed At: [facility] [place], [place] [id number] Lindon Romp MD [id number]   dRVVT Mix     Status: Abnormal   Collection Time: 03/11/17  2:00 PM  Result Value Ref Range   dRVVT Mix 97.3 (H) 0.0 - 47.0 sec    Comment: (NOTE) Performed At: [facility] [place], [place] [id number] Lindon Romp MD [id number]   dRVVT Confirm     Status: Abnormal   Collection Time: 03/11/17  2:00 PM  Result Value Ref Range   dRVVT Confirm 1.6 (H) 0.8 - 1.2 ratio    Comment: (NOTE) Performed At: [facility] [place], [place] [id number] Lindon Romp MD [id number]   Iron and TIBC     Status: Abnormal   Collection Time: 03/11/17  2:06 PM  Result Value Ref Range   Iron 49 45 - 182 ug/dL   TIBC 349 250 - 450 ug/dL   Saturation Ratios 14 (L) 17.9 - 39.5 %   UIBC 300 ug/dL  Ferritin     Status: None   Collection Time: 03/11/17  2:06 PM  Result Value Ref Range   Ferritin 40 24 - 336 ng/mL    Radiology No  results found.   Assessment/Plan  Pseudoaneurysm of left femoral artery (HCC) It is possible that residual hematoma has cause compressive symptoms. That would be unlikely.  PAD (peripheral artery disease) (HCC) Extensive. Status post multiple interventions. 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  Hypertension blood pressure control important in reducing the progression of atherosclerotic disease. On appropriate oral medications.   Recurrent deep vein thrombosis (DVT) (HCC) 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 He is getting his CT scan in a couple of weeks and he is scheduled to see me already in the middle of next month. That would be a few days after the CT scan and we can review those results. I suspect he would benefit from a left lower extremity venogram with an expected left iliac intervention. Patient voices his understanding.    [name], MD  03/13/2017 2:03 PM    This  note was created with Dragon medical transcription system.  Any errors from dictation are purely unintentional

## 2017-03-13 NOTE — Patient Instructions (Signed)
Deep Vein Thrombosis A deep vein thrombosis (DVT) is a blood clot (thrombus) that usually occurs in a deep, larger vein of the lower leg or the pelvis, or in an upper extremity such as the arm. These are dangerous and can lead to serious and even life-threatening complications if the clot travels to the lungs. A DVT can damage the valves in your leg veins so that instead of flowing upward, the blood pools in the lower leg. This is called post-thrombotic syndrome, and it can result in pain, swelling, discoloration, and sores on the leg. What are the causes? A DVT is caused by the formation of a blood clot in your leg, pelvis, or arm. Usually, several things contribute to the formation of blood clots. A clot may develop when:  Your blood flow slows down.  Your vein becomes damaged in some way.  You have a condition that makes your blood clot more easily.  What increases the risk? A DVT is more likely to develop in:  People who are older, especially over 60 years of age.  People who are overweight (obese).  People who sit or lie still for a long time, such as during long-distance travel (over 4 hours), bed rest, hospitalization, or during recovery from certain medical conditions like a stroke.  People who do not engage in much physical activity (sedentary lifestyle).  People who have chronic breathing disorders.  People who have a personal or family history of blood clots or blood clotting disease.  People who have peripheral vascular disease (PVD), diabetes, or some types of cancer.  People who have heart disease, especially if the person had a recent heart attack or has congestive heart failure.  People who have neurological diseases that affect the legs (leg paresis).  People who have had a traumatic injury, such as breaking a hip or leg.  People who have recently had major or lengthy surgery, especially on the hip, knee, or abdomen.  People who have had a central line placed  inside a large vein.  People who take medicines that contain the hormone estrogen. These include birth control pills and hormone replacement therapy.  Pregnancy or during childbirth or the postpartum period.  Long plane flights (over 8 hours).  What are the signs or symptoms?  Symptoms of a DVT can include:  Swelling of your leg or arm, especially if one side is much worse.  Warmth and redness of your leg or arm, especially if one side is much worse.  Pain in your arm or leg. If the clot is in your leg, symptoms may be more noticeable or worse when you stand or walk.  A feeling of pins and needles, if the clot is in the arm.  The symptoms of a DVT that has traveled to the lungs (pulmonary embolism, PE) usually start suddenly and include:  Shortness of breath while active or at rest.  Coughing or coughing up blood or blood-tinged mucus.  Chest pain that is often worse with deep breaths.  Rapid or irregular heartbeat.  Feeling light-headed or dizzy.  Fainting.  Feeling anxious.  Sweating.  There may also be pain and swelling in a leg if that is where the blood clot started. These symptoms may represent a serious problem that is an emergency. Do not wait to see if the symptoms will go away. Get medical help right away. Call your local emergency services (911 in the [place] Do not drive yourself to the hospital. How is this diagnosed? Your health   care provider will take a medical history and perform a physical exam. You may also have other tests, including:  Blood tests to assess the clotting properties of your blood.  Imaging tests, such as CT, ultrasound, MRI, X-ray, and other tests to see if you have clots anywhere in your body.  How is this treated? After a DVT is identified, it can be treated. The type of treatment that you receive depends on many factors, such as the cause of your DVT, your risk for bleeding or developing more clots, and other medical conditions that  you have. Sometimes, a combination of treatments is necessary. Treatment options may be combined and include:  Monitoring the blood clot with ultrasound.  Taking medicines by mouth, such as newer blood thinners (anticoagulants), thrombolytics, or warfarin.  Taking anticoagulant medicine by injection or through an IV tube.  Wearing compression stockings or using different types ofdevices.  Surgery (rare) to remove the blood clot or to place a filter in your abdomen to stop the blood clot from traveling to your lungs.  Treatments for a DVT are often divided into immediate treatment and long-term treatment (up to 3 months after DVT). You can work with your health care provider to choose the treatment program that is best for you. Follow these instructions at home: If you are taking a newer oral anticoagulant:  Take the medicine every single day at the same time each day.  Understand what foods and drugs interact with this medicine.  Understand that there are no regular blood tests required when using this medicine.  Understand the side effects of this medicine, including excessive bruising or bleeding. Ask your health care provider or pharmacist about other possible side effects. If you are taking warfarin:  Understand how to take warfarin and know which foods can affect how warfarin works in your body.  Understand that it is dangerous to take too much or too little warfarin. Too much warfarin increases the risk of bleeding. Too little warfarin continues to allow the risk for blood clots.  Follow your PT and INR blood testing schedule. The PT and INR results allow your health care provider to adjust your dose of warfarin. It is very important that you have your PT and INR tested as often as told by your health care provider.  Avoid major changes in your diet, or tell your health care provider before you change your diet. Arrange a visit with a registered dietitian to answer your  questions. Many foods, especially foods that are high in vitamin K, can interfere with warfarin and affect the PT and INR results. Eat a consistent amount of foods that are high in vitamin K, such as: ? Spinach, kale, broccoli, cabbage, collard greens, turnip greens, Brussels sprouts, peas, cauliflower, seaweed, and parsley. ? Beef liver and pork liver. ? Green tea. ? Soybean oil.  Tell your health care provider about any and all medicines, vitamins, and supplements that you take, including aspirin and other over-the-counter anti-inflammatory medicines. Be especially cautious with aspirin and anti-inflammatory medicines. Do not take those before you ask your health care provider if it is safe to do so. This is important because many medicines can interfere with warfarin and affect the PT and INR results.  Do not start or stop taking any over-the-counter or prescription medicine unless your health care provider or pharmacist tells you to do so. If you take warfarin, you will also need to do these things:  Hold pressure over cuts for longer than   usual.  Tell your dentist and other health care providers that you are taking warfarin before you have any procedures in which bleeding may occur.  Avoid alcohol or drink very small amounts. Tell your health care provider if you change your alcohol intake.  Do not use tobacco products, including cigarettes, chewing tobacco, and e-cigarettes. If you need help quitting, ask your health care provider.  Avoid contact sports.  General instructions  Take over-the-counter and prescription medicines only as told by your health care provider. Anticoagulant medicines can have side effects, including easy bruising and difficulty stopping bleeding. If you are prescribed an anticoagulant, you will also need to do these things: ? Hold pressure over cuts for longer than usual. ? Tell your dentist and other health care providers that you are taking anticoagulants  before you have any procedures in which bleeding may occur. ? Avoid contact sports.  Wear a medical alert bracelet or carry a medical alert card that says you have had a PE.  Ask your health care provider how soon you can go back to your normal activities. Stay active to prevent new blood clots from forming.  Make sure to exercise while traveling or when you have been sitting or standing for a long period of time. It is very important to exercise. Exercise your legs by walking or by tightening and relaxing your leg muscles often. Take frequent walks.  Wear compression stockings as told by your health care provider to help prevent more blood clots from forming.  Do not use tobacco products, including cigarettes, chewing tobacco, and e-cigarettes. If you need help quitting, ask your health care provider.  Keep all follow-up appointments with your health care provider. This is important. How is this prevented? Take these actions to decrease your risk of developing another DVT:  Exercise regularly. For at least 30 minutes every day, engage in: ? Activity that involves moving your arms and legs. ? Activity that encourages good blood flow through your body by increasing your heart rate.  Exercise your arms and legs every hour during long-distance travel (over 4 hours). Drink plenty of water and avoid drinking alcohol while traveling.  Avoid sitting or lying in bed for long periods of time without moving your legs.  Maintain a weight that is appropriate for your height. Ask your health care provider what weight is healthy for you.  If you are a woman who is over 35 years of age, avoid unnecessary use of medicines that contain estrogen. These include birth control pills.  Do not smoke, especially if you take estrogen medicines. If you need help quitting, ask your health care provider.  If you are hospitalized, prevention measures may include:  Early walking after surgery, as soon as your  health care provider says that it is safe.  Receiving anticoagulants to prevent blood clots.If you cannot take anticoagulants, other options may be available, such as wearing compression stockings or using different types of devices.  Get help right away if:  You have new or increased pain, swelling, or redness in an arm or leg.  You have numbness or tingling in an arm or leg.  You have shortness of breath while active or at rest.  You have chest pain.  You have a rapid or irregular heartbeat.  You feel light-headed or dizzy.  You cough up blood.  You notice blood in your vomit, bowel movement, or urine. These symptoms may represent a serious problem that is an emergency. Do not wait to see   if the symptoms will go away. Get medical help right away. Call your local emergency services (911 in the [place] Do not drive yourself to the hospital. This information is not intended to replace advice given to you by your health care provider. Make sure you discuss any questions you have with your health care provider. Document Released: 08/04/2005 Document Revised: 01/10/2016 Document Reviewed: 11/29/2014 [employer] Interactive Patient Education  2017 [employer]  

## 2017-03-13 NOTE — Assessment & Plan Note (Signed)
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 He is getting his CT scan in a couple of weeks and he is scheduled to see me already in the middle of next month. That would be a few days after the CT scan and we can review those results. I suspect he would benefit from a left lower extremity venogram with an expected left iliac intervention. Patient voices his understanding.

## 2017-03-13 NOTE — Assessment & Plan Note (Signed)
It is possible that residual hematoma has cause compressive symptoms. That would be unlikely.

## 2017-03-13 NOTE — Assessment & Plan Note (Signed)
Extensive. Status post multiple interventions. 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

## 2017-03-16 LAB — FACTOR 5 LEIDEN

## 2017-03-16 LAB — PROTHROMBIN GENE MUTATION

## 2017-03-23 ENCOUNTER — Inpatient Hospital Stay: Payer: Medicare Other

## 2017-03-23 ENCOUNTER — Encounter: Payer: Self-pay | Admitting: Oncology

## 2017-03-23 ENCOUNTER — Inpatient Hospital Stay: Payer: Medicare Other | Attending: Oncology | Admitting: Oncology

## 2017-03-23 VITALS — BP 118/61 | HR 53 | Temp 97.6°F | Resp 16 | Wt 200.3 lb

## 2017-03-23 DIAGNOSIS — I739 Peripheral vascular disease, unspecified: Secondary | ICD-10-CM | POA: Insufficient documentation

## 2017-03-23 DIAGNOSIS — Z89512 Acquired absence of left leg below knee: Secondary | ICD-10-CM | POA: Diagnosis not present

## 2017-03-23 DIAGNOSIS — I82409 Acute embolism and thrombosis of unspecified deep veins of unspecified lower extremity: Secondary | ICD-10-CM | POA: Diagnosis not present

## 2017-03-23 DIAGNOSIS — Z7901 Long term (current) use of anticoagulants: Secondary | ICD-10-CM | POA: Insufficient documentation

## 2017-03-23 DIAGNOSIS — G4700 Insomnia, unspecified: Secondary | ICD-10-CM | POA: Insufficient documentation

## 2017-03-23 DIAGNOSIS — E876 Hypokalemia: Secondary | ICD-10-CM | POA: Diagnosis not present

## 2017-03-23 DIAGNOSIS — D509 Iron deficiency anemia, unspecified: Secondary | ICD-10-CM | POA: Diagnosis not present

## 2017-03-23 DIAGNOSIS — I110 Hypertensive heart disease with heart failure: Secondary | ICD-10-CM | POA: Insufficient documentation

## 2017-03-23 DIAGNOSIS — J449 Chronic obstructive pulmonary disease, unspecified: Secondary | ICD-10-CM | POA: Diagnosis not present

## 2017-03-23 DIAGNOSIS — Z87891 Personal history of nicotine dependence: Secondary | ICD-10-CM | POA: Diagnosis not present

## 2017-03-23 DIAGNOSIS — Z7982 Long term (current) use of aspirin: Secondary | ICD-10-CM | POA: Diagnosis not present

## 2017-03-23 MED ORDER — POLYSACCHARIDE IRON COMPLEX 150 MG PO CAPS: 150.0000 mg | ORAL_CAPSULE | Freq: Two times a day (BID) | ORAL | 3 refills | Status: DC

## 2017-03-23 NOTE — Progress Notes (Signed)
[facility] Initial Visit:  Patient Care Team: Alvester Morin, MD as PCP - General (Family Medicine)  CHIEF COMPLAINTS/PURPOSE OF CONSULTATION: Recurrent DVT  HISTORY OF PRESENTING ILLNESS: Timothy Juarez 66 y.o. male with PMH listed as below is here for evaluation of recurrent DVT and management. Patient is accompanied by RN from [facility] at [facility].  Patient is a poor historian. He remembers having leg clots for more than one time. He remembers taking Eliquis in the past and recently his anticoagulation has been switched to [place].  Reviewed the medical records from nursing home by patient's PCP Dr.Slade-Hartman, patient had been on Eliquis 32m BID for previous DVTs. His medical records showed he had acute DVT at the left common femoral vein on 11/26/2016, and he was placed on Eliquis 2.592mBID. Later when another venous doppler was obtained on 02/15/2017 due to persistent pain and swelling, doppler showed persist deep vein thrombosis involving proxima to distal femoral vein. Common femoral vein has normal compression. There was a note on the second doppler report on 03/03/2017 that patient was taking Eliquis 92m39mID, which was stopped on 7/19 and patient was placed on Lovenox. Patient had swelling and pain of his left stump and symptoms does not improve with being on Lovenox. Xarelto 192m37mD was started on 03/05/2017 and Lovenox was discontinued on 03/09/2017 with a plan to switch to Xarelto 20mg83mly after 21 days.  It was mentioned in nursing home note that patient had been on Warfarin previously and acquired blood clot while on warfarin. Patient does not remember this and the details of his?warfarin resistance is unclear.    INTERVAL HISTORY Today patient came back for follow-up and discuss about lab results. He has been on Xarelto 192mg 49m He reports his left stump pain and swelling has dramatically improved No more pain today. He denies  any bleeding events.he was seen by vascular surgeon Dr. Dew anLucky CowboyT anginal abdomen pelvis was ordered.   Review of Systems  Constitutional: Negative.   HENT:  Negative.   Eyes: Negative.   Respiratory: Negative.   Cardiovascular: Negative.   Gastrointestinal: Negative.   Endocrine: Negative.   Genitourinary: Negative.    Musculoskeletal:       Left stump pain and swelling much improved.   Skin: Negative.   Neurological: Negative.   Hematological: Negative.   Psychiatric/Behavioral: Negative.     MEDICAL HISTORY: Past Medical History:  Diagnosis Date  . Allergy   . Anemia   . ARF (acute respiratory failure) (HCC)    H/O  . CHF (congestive heart failure) (HCC)  BeckerCOPD (chronic obstructive pulmonary disease) (HCC)  [place]pertension   . Hypokalemia   . Insomnia   . Muscle contracture    MUSCLE SPASMS  . Peripheral vascular disease (HCC)  [place]ure ulcer     SURGICAL HISTORY: Past Surgical History:  Procedure Laterality Date  . AMPUTATION Left 09/19/2015   Procedure: AMPUTATION BELOW KNEE;  Surgeon: Jason Algernon Huxley Location: [facility] ORS;  Service: Vascular;  Laterality: Left;  . AMPUTATION Left 11/01/2015   Procedure: AMPUTATION ABOVE KNEE;  Surgeon: Jason Algernon Huxley Location: [facility] ORS;  Service: Vascular;  Laterality: Left;  . APPLICATION OF WOUND VAC Left 10/18/2015   Procedure: APPLICATION OF WOUND VAC;  Surgeon: Jason Algernon Huxley Location: [facility] ORS;  Service: Vascular;  Laterality: Left;  . PERIPHERAL VASCULAR CATHETERIZATION Left 01/18/2015   Procedure: Lower  Extremity Angiography;  Surgeon: Algernon Huxley, MD;  Location: [place] [facility];  Service: Cardiovascular;  Laterality: Left;  . PERIPHERAL VASCULAR CATHETERIZATION N/A 01/18/2015   Procedure: Lower Extremity Intervention;  Surgeon: Algernon Huxley, MD;  Location: [place] [facility];  Service: Cardiovascular;  Laterality: N/A;  . PERIPHERAL VASCULAR CATHETERIZATION  07/30/2015   Procedure: Lower Extremity  Intervention;  Surgeon: Algernon Huxley, MD;  Location: [place] [facility];  Service: Cardiovascular;;  . PERIPHERAL VASCULAR CATHETERIZATION N/A 07/30/2015   Procedure: Abdominal Aortogram w/Lower Extremity;  Surgeon: Algernon Huxley, MD;  Location: [place] [facility];  Service: Cardiovascular;  Laterality: N/A;  . PERIPHERAL VASCULAR CATHETERIZATION Left 08/22/2015   Procedure: Lower Extremity Angiography;  Surgeon: Algernon Huxley, MD;  Location: [place] [facility];  Service: Cardiovascular;  Laterality: Left;  . PERIPHERAL VASCULAR CATHETERIZATION Left 08/22/2015   Procedure: Lower Extremity Intervention;  Surgeon: Algernon Huxley, MD;  Location: [place] [facility];  Service: Cardiovascular;  Laterality: Left;  . PERIPHERAL VASCULAR CATHETERIZATION Right 03/31/2016   Procedure: Lower Extremity Angiography;  Surgeon: Algernon Huxley, MD;  Location: [place] [facility];  Service: Cardiovascular;  Laterality: Right;  . PERIPHERAL VASCULAR CATHETERIZATION  03/31/2016   Procedure: Lower Extremity Intervention;  Surgeon: Algernon Huxley, MD;  Location: [place] [facility];  Service: Cardiovascular;;  . PERIPHERAL VASCULAR CATHETERIZATION Left 04/10/2016   Procedure: Lower Extremity Angiography;  Surgeon: Algernon Huxley, MD;  Location: [place] [facility];  Service: Cardiovascular;  Laterality: Left;  . WOUND DEBRIDEMENT Left 10/18/2015   Procedure: DEBRIDEMENT WOUND   ( LEFT BKA DEBRIDEMENT );  Surgeon: Algernon Huxley, MD;  Location: [facility] ORS;  Service: Vascular;  Laterality: Left;    SOCIAL HISTORY: Social History   Social History  . Marital status: Legally Separated    Spouse name: N/A  . Number of children: N/A  . Years of education: N/A   Occupational History  . Not on file.   Social History Main Topics  . Smoking status: Former Smoker    Packs/day: 1.00    Years: 44.00    Types: Cigarettes    Quit date: 11/17/2012  . Smokeless tobacco: Never Used  . Alcohol use No  . Drug use: No  . Sexual activity: Not  on file   Other Topics Concern  . Not on file   Social History Narrative  . No narrative on file    FAMILY HISTORY No family history on file.  ALLERGIES:  has No Known Allergies.  MEDICATIONS:  Current Outpatient Prescriptions  Medication Sig Dispense Refill  . acetaminophen (TYLENOL) 325 MG tablet Take 650 mg by mouth at bedtime.     [name] allopurinol (ZYLOPRIM) 100 MG tablet Take 100 mg by mouth daily.    [name] aspirin EC 81 MG EC tablet Take 1 tablet (81 mg total) by mouth daily. 30 tablet 11  . baclofen (LIORESAL) 10 MG tablet Take 10 mg by mouth 3 (three) times daily. Hold for sedation    . budesonide-formoterol (SYMBICORT) 80-4.5 MCG/ACT inhaler Inhale 2 puffs into the lungs 2 (two) times daily.    . fentaNYL (DURAGESIC - DOSED MCG/HR) 12 MCG/HR     . furosemide (LASIX) 40 MG tablet Take 40 mg by mouth 2 (two) times daily.     [name] gabapentin (NEURONTIN) 100 MG capsule Take 100 mg by mouth at bedtime.    . iron polysaccharides (NIFEREX) 150 MG capsule Take 1 capsule (150 mg  total) by mouth 2 (two) times daily. 60 capsule 3  . Melatonin 5 MG CAPS Take 1 capsule by mouth at bedtime as needed.    . metoprolol tartrate (LOPRESSOR) 25 MG tablet Take 0.5 tablets (12.5 mg total) by mouth 2 (two) times daily. 60 tablet 5  . Omega-3 Fatty Acids (FISH OIL) 1000 MG CAPS Take 1,000 mg by mouth every morning.    . Oxycodone HCl 10 MG TABS Take 10 mg by mouth 3 (three) times daily.    . potassium chloride (K-DUR) 10 MEQ tablet Take 20 mEq by mouth daily.     . Rivaroxaban (XARELTO) 15 MG TABS tablet Take 15 mg by mouth 2 (two) times daily with a meal.    . tiotropium (SPIRIVA) 18 MCG inhalation capsule Place 18 mcg into inhaler and inhale daily.    [name] ADVAIR DISKUS 250-50 MCG/DOSE AEPB      No current facility-administered medications for this visit.     PHYSICAL EXAMINATION:  ECOG PERFORMANCE STATUS: 1 - Symptomatic but completely ambulatory   Vitals:   03/23/17 0958  BP: 118/61  Pulse:  (!) 53  Resp: 16  Temp: 97.6 F (36.4 C)    Filed Weights   03/23/17 0958  Weight: 200 lb 5 oz (90.9 kg)     Physical Exam GENERAL: No distress, well nourished.  SKIN:  No rashes or significant lesions  HEAD: Normocephalic, No masses, lesions, tenderness or abnormalities  EYES: Conjunctiva are pink, non icteric ENT: External ears normal ,lips , buccal mucosa, and tongue normal and mucous membranes are moist  LYMPH: No palpable cervical and axillary lymphadenopathy  LUNGS: Clear to auscultation, no crackles or wheezes HEART: Regular rate & rhythm, no murmurs, no gallops, S1 normal and S2 normal  ABDOMEN: Abdomen soft, non-tender, normal bowel sounds, I did not appreciate any  masses or organomegaly  MUSCULOSKELETAL: No CVA tenderness and no tenderness on percussion of the back or rib cage.  EXTREMITIES: left above knee amputation stump non tender. Swelling has improved since last visit. No edema at right lower extremity.  NEURO: Alert & oriented, no focal motor/sensory deficits.   LABORATORY DATA: I have personally reviewed the data as listed:  Orders Only on 03/11/2017  Component Date Value Ref Range Status  . Iron 03/11/2017 49  45 - 182 ug/dL Final  . TIBC 03/11/2017 349  250 - 450 ug/dL Final  . Saturation Ratios 03/11/2017 14* 17.9 - 39.5 % Final  . UIBC 03/11/2017 300  ug/dL Final  . Ferritin 03/11/2017 40  24 - 336 ng/mL Final  Clinical Support on 03/11/2017  Component Date Value Ref Range Status  . Anticardiolipin IgG 03/11/2017 <9  0 - 14 GPL U/mL Final   Comment: (NOTE)                          Negative:              <15                          Indeterminate:     15 - 20                          Low-Med Positive: >20 - 80                          High Positive:         >  80   . Anticardiolipin IgM 03/11/2017 <9  0 - 12 MPL U/mL Final   Comment: (NOTE)                          Negative:              <13                          Indeterminate:     13 - 20                           Low-Med Positive: >20 - 80                          High Positive:         >80   . PTT Lupus Anticoagulant 03/11/2017 61.2* 0.0 - 51.9 sec Final  . DRVVT 03/11/2017 126.3* 0.0 - 47.0 sec Final  . Lupus Anticoag Interp 03/11/2017 Comment:   Final   Comment: (NOTE) Results are consistent with the presence of a lupus anticoagulant. NOTE: Only persistent lupus anticoagulants are thought to be of clinical significance. For this reason, repeat testing in 12 or more weeks after an initial positive result should be considered to confirm or refute the presence of a lupus anticoagulant, depending on clinical presentation. Results of lupus anticoagulant tests may be falsely positive in the presence of certain anticoagulant therapies. Performed At: [facility] [place], [place] [id number] Lindon Romp MD FB:[id number]   . Recommendations-PTGENE: 03/11/2017 Comment   Final   Comment: (NOTE) NEGATIVE No mutation identified. Comment: A point mutation (G20210A) in the factor II (prothrombin) gene is the second most common cause of inherited thrombophilia. The incidence of this mutation in the [place] Caucasian population is about 2% and in the [place] American population it is approximately 0.5%. This mutation is rare in the [place] and Native American population. Being heterozygous for a prothrombin mutation increases the risk for developing venous thrombosis about 2 to 3 times above the general population risk. Being homozygous for the prothrombin gene mutation increases the relative risk for venous thrombosis further, although it is not yet known how much further the risk is increased. In women heterozygous for the prothrombin gene mutation, the use of estrogen containing oral contraceptives increases the relative risk of venous thrombosis about 16 times and the risk of developing cerebral thrombosis is also significantly increased. In pregnancy  the pr                          othrombin gene mutation increases risk for venous thrombosis and may increase risk for stillbirth, placental abruption, pre-eclampsia and fetal growth restriction. If the patient possesses two or more congenital or acquired thrombophilic risk factors, the risk for thrombosis may rise to more than the sum of the risk ratios for the individual mutations. This assay detects only the prothrombin G20210A mutation and does not measure genetic abnormalities elsewhere in the genome. Other thrombotic risk factors may be pursued through systematic clinical laboratory analysis. These factors include the R506Q (Leiden) mutation in the Factor V gene, plasma homocysteine levels, as well as testing for deficiencies of antithrombin III, protein C and protein S. Genetic Counselors are available for health care providers to discuss results at [phone] [phone]). Methodology: DNA analysis of  the Factor II gene was performed by PCR amplification followed by restriction analysis. The di                          agnostic sensitivity is >99% for both. All the tests must be combined with clinical information for the most accurate interpretation. Molecular-based testing is highly accurate, but as in any laboratory test, diagnostic errors may occur. This test was developed and its performance characteristics determined by [facility]. It has not been cleared or approved by the Food and Drug Administration. Poort SR, et al. Blood. 1996; 67:8938-1017. Varga EA. Circulation. 2004; 510:C58-N27. Mervin Hack, et [place]; 19:700-703. Allison Quarry, PhD, [facility] Ruben Reason, PhD, [facility] Annetta Maw, M.S., PhD, [facility] Alfredo Bach, PhD, [facility] Norva Riffle, PhD, [facility] Earlean Polka, PhD, [facility] Performed At: [facility] [address] [place], [place] [id number] Nechama Guard MD RW:[id number]   . Recommendations-F5LEID:  03/11/2017 Comment   Final   Comment: (NOTE) Result:  Negative (no mutation found) Factor V Leiden is a specific mutation (R506Q) in the factor V gene that is associated with an increased risk of venous thrombosis. Factor V Leiden is more resistant to inactivation by activated protein C.  As a result, factor V persists in the circulation leading to a mild hyper- coagulable state.  The Leiden mutation accounts for 90% - 95% of APC resistance.  Factor V Leiden has been reported in patients with deep vein thrombosis, pulmonary embolus, central retinal vein occlusion, cerebral sinus thrombosis and hepatic vein thrombosis. Other risk factors to be considered in the workup for venous thrombosis include the G20210A mutation in the factor II (prothrombin) gene, protein S and C deficiency, and antithrombin deficiencies. Anticardiolipin antibody and lupus anticoagulant analysis may be appropriate for certain patients, as well as homocysteine levels. Contact your local [facility] for information on how to order additi                          onal testing if desired. **Genetic counselors are available for health care providers to**  discuss results at [phone]). Methodology: DNA analysis of the Factor V gene was performed by allele-specific PCR. The diagnostic sensitivity and specificity is >99% for both. Molecular-based testing is highly accurate, but as in any laboratory test, diagnostic errors may occur. All test results must be combined with clinical information for the most accurate interpretation. This test was developed and its performance characteristics determined by [facility]. It has not been cleared or approved by the Food and Drug Administration. References: Voelkerding K (1996).  Clin Lab Med [phone]. Allison Quarry, PhD, [facility] Ruben Reason, PhD, [facility] Annetta Maw, M.S., PhD, [facility] Alfredo Bach, PhD, [facility] Norva Riffle, PhD, [facility] Earlean Polka PhD,  [facility] Performed At: [facility] [address] [place], [place] [id number] Nechama Guard MD AS:[id number]                          4   . Protein S Activity 03/11/2017 121  63 - 140 % Final   Comment: (NOTE) Protein S activity may be falsely increased (masking an abnormal, low result) in patients receiving direct Xa inhibitor (e.g., rivaroxaban, apixaban, edoxaban) or a direct thrombin inhibitor (e.g., dabigatran) anticoagulant treatment due to assay interference by these drugs. Performed At: [facility] [address] [place], [place] [id number] Evette Doffing  Darrick Penna MD LX:[id number]   . Protein C Activity 03/11/2017 142  73 - 180 % Final   Comment: (NOTE) Performed At: [facility] [place], [place] [id number] Lindon Romp MD IW:[id number]   . WBC 03/11/2017 12.8* 3.8 - 10.6 K/uL Final  . RBC 03/11/2017 4.95  4.40 - 5.90 MIL/uL Final  . Hemoglobin 03/11/2017 12.6* 13.0 - 18.0 g/dL Final  . HCT 03/11/2017 38.3* 40.0 - 52.0 % Final  . MCV 03/11/2017 77.3* 80.0 - 100.0 fL Final  . MCH 03/11/2017 25.5* 26.0 - 34.0 pg Final  . MCHC 03/11/2017 33.0  32.0 - 36.0 g/dL Final  . RDW 03/11/2017 16.7* 11.5 - 14.5 % Final  . Platelets 03/11/2017 319  150 - 440 K/uL Final  . Neutrophils Relative % 03/11/2017 68  % Final  . Neutro Abs 03/11/2017 8.7* 1.4 - 6.5 K/uL Final  . Lymphocytes Relative 03/11/2017 22  % Final  . Lymphs Abs 03/11/2017 2.9  1.0 - 3.6 K/uL Final  . Monocytes Relative 03/11/2017 6  % Final  . Monocytes Absolute 03/11/2017 0.8  0.2 - 1.0 K/uL Final  . Eosinophils Relative 03/11/2017 3  % Final  . Eosinophils Absolute 03/11/2017 0.3  0 - 0.7 K/uL Final  . Basophils Relative 03/11/2017 1  % Final  . Basophils Absolute 03/11/2017 0.1  0 - 0.1 K/uL Final  . LDH 03/11/2017 142  98 - 192 U/L Final  . Sodium 03/11/2017 137  135 - 145 mmol/L Final  . Potassium 03/11/2017 3.5  3.5 - 5.1 mmol/L Final  . Chloride 03/11/2017 101  101 - 111  mmol/L Final  . CO2 03/11/2017 29  22 - 32 mmol/L Final  . Glucose, Bld 03/11/2017 82  65 - 99 mg/dL Final  . BUN 03/11/2017 18  6 - 20 mg/dL Final  . Creatinine, Ser 03/11/2017 1.13  0.61 - 1.24 mg/dL Final  . Calcium 03/11/2017 9.1  8.9 - 10.3 mg/dL Final  . Total Protein 03/11/2017 8.3* 6.5 - 8.1 g/dL Final  . Albumin 03/11/2017 4.2  3.5 - 5.0 g/dL Final  . AST 03/11/2017 24  15 - 41 U/L Final  . ALT 03/11/2017 22  17 - 63 U/L Final  . Alkaline Phosphatase 03/11/2017 96  38 - 126 U/L Final  . Total Bilirubin 03/11/2017 0.7  0.3 - 1.2 mg/dL Final  . GFR calc non Af Amer 03/11/2017 >60  >60 mL/min Final  . GFR calc Af Amer 03/11/2017 >60  >60 mL/min Final   Comment: (NOTE) The eGFR has been calculated using the CKD EPI equation. This calculation has not been validated in all clinical situations. eGFR's persistently <60 mL/min signify possible Chronic Kidney Disease.   . Anion gap 03/11/2017 7  5 - 15 Final  . PTT-LA Mix 03/11/2017 52.3* 0.0 - 48.9 sec Final   Comment: (NOTE) Performed At: [facility] [place], [place] [id number] Lindon Romp MD QB:[id number]   . Hexagonal Phase Phospholipid 03/11/2017 1  0 - 11 sec Final   Comment: (NOTE) Performed At: [facility] [place], [place] [id number] Lindon Romp MD HX:[id number]   . dRVVT Mix 03/11/2017 97.3* 0.0 - 47.0 sec Final   Comment: (NOTE) Performed At: [facility] [place], [place] [id number] Lindon Romp MD LM:[id number]   . dRVVT Confirm 03/11/2017 1.6* 0.8 - 1.2 ratio Final   Comment: (NOTE) Performed At: [facility] [place], [place]  [id number] Lindon Romp MD MV:[id number]     RADIOGRAPHIC STUDIES: I have personally reviewed the radiological images as listed and agree with the findings in the report 04/03/2017 US venous doppler IMPRESSION: Left inguinal/femoral partially thrombosed  pseudoaneurysm/hematoma measuring 15 x 5.3 x 6.8 cm.  Reviewed image records from [facility] 11/26/2016 patient had a venous Doppler extremity left done which showed no occluding acute DVT at the left common femoral vein. 03/03/2017 patient had another venous Doppler extremity left which showed deep vein thromboses involving the proximal to distal femoral vein the common femoral artery now has normal compression.  04/05/2013 CT chest with contrast    Chronic pulmonary disease with areas of atelectasis and bronchiectasis. Continued significant opacification and bullous change in the left lung especially in the upper lobe and superior segment of the  left lower lobe with other patchy areas bilaterally. There may be mild interval improvement in the area of the lower lingula. Malignancy cannot be excluded in the abnormal areas of the lungs. No central pulmonary arterial filling defect is seen. The bolus is not optimal for evaluation of the lungs    otherwise.  ASSESSMENT/PLAN 1. Recurrent deep vein thrombosis (DVT) ([place])   2. Iron deficiency anemia, unspecified iron deficiency anemia type   . # I discussed with patient about his hypercoagulable workup. It showed that he has anti-lupus antibody. It's difficult to say for sure that he has antiphospholipid syndrome as this test needs to be repeated in 12 weeks.Also being on DOAC can potentially interfere with the test results.I discussed with him that if he truly has antiphospholipid syndrome, studies have shown that warfarin is superior than DOAC in preventing recurrent DVT/PE. Patient has cleared history of questionable developing DVT while taking Coumadin. The context was not clear whether he was subtherapeutic or not at that time. I suggest that he be started on Lovenox 1 mg /kg twice a day for now, while looking to his history of questionable warfarin resistance. Patient voices understanding about the recommendation and he  tells me he prefers staying on Xarelto as he is not interested in going back to Lovenox shots. I explained to him that Xarelto may not prevent clots coming back as efficiently as warfarin or Lovenox and he may be at risk of developing another clot, and sometimes can be life threatening if the clot travels to his lungs. Patient voices understanding and he is willing to take risks.  Patient is willing to be switched to Lovenox if he develops VTE on Xarelto in the future. Considering improved symptoms and patient's preference, continue Xarelto with close monitoring.   # patient doesn't remember when was his last colonoscopy. His blood work showed he has iron deficiency. I recommend to increase his iron supplementation to twice a day. Occult stool x 3. Check hemoglobinopathy.    Orders Placed This Encounter  Procedures  . Occult blood card to lab, stool    Standing Status:   Future    Standing Expiration Date:   03/23/2018  . Occult blood card to lab, stool    Standing Status:   Future    Standing Expiration Date:   03/23/2018  . Occult blood card to lab, stool    Standing Status:   Future    Standing Expiration Date:   03/23/2018  . Hemoglobinopathy evaluation    Standing Status:   Future    Number of Occurrences:   1    Standing Expiration Date:   03/23/2018  .  CBC with Differential/Platelet    Standing Status:   Future    Standing Expiration Date:   03/23/2018    All questions were answered. The patient knows to call the clinic with any problems, questions or concerns. Total face to face encounter time for this patient visit was 40 min. >50% of the time was  spent in counseling and coordination of care.  See me in 4 weeks.   Earlie Server, MD  03/23/2017 10:43 AM

## 2017-03-23 NOTE — Progress Notes (Signed)
Patient here today for follow up.  Patient states no new concerns today  

## 2017-03-24 LAB — HEMOGLOBINOPATHY EVALUATION
HGB A2 QUANT: 2.5 % (ref 1.8–3.2)
HGB A: 97.5 % (ref 96.4–98.8)
HGB S QUANTITAION: 0 %
Hgb C: 0 %
Hgb F Quant: 0 % (ref 0.0–2.0)
Hgb Variant: 0 %

## 2017-03-27 DIAGNOSIS — I82409 Acute embolism and thrombosis of unspecified deep veins of unspecified lower extremity: Secondary | ICD-10-CM | POA: Diagnosis not present

## 2017-03-27 DIAGNOSIS — D509 Iron deficiency anemia, unspecified: Secondary | ICD-10-CM | POA: Diagnosis not present

## 2017-03-27 DIAGNOSIS — I110 Hypertensive heart disease with heart failure: Secondary | ICD-10-CM | POA: Diagnosis not present

## 2017-03-27 DIAGNOSIS — Z7901 Long term (current) use of anticoagulants: Secondary | ICD-10-CM | POA: Diagnosis not present

## 2017-03-27 DIAGNOSIS — E876 Hypokalemia: Secondary | ICD-10-CM | POA: Diagnosis not present

## 2017-03-27 DIAGNOSIS — J449 Chronic obstructive pulmonary disease, unspecified: Secondary | ICD-10-CM | POA: Diagnosis not present

## 2017-03-28 DIAGNOSIS — D509 Iron deficiency anemia, unspecified: Secondary | ICD-10-CM | POA: Diagnosis not present

## 2017-03-28 DIAGNOSIS — J449 Chronic obstructive pulmonary disease, unspecified: Secondary | ICD-10-CM | POA: Diagnosis not present

## 2017-03-28 DIAGNOSIS — E876 Hypokalemia: Secondary | ICD-10-CM | POA: Diagnosis not present

## 2017-03-28 DIAGNOSIS — I82409 Acute embolism and thrombosis of unspecified deep veins of unspecified lower extremity: Secondary | ICD-10-CM | POA: Diagnosis not present

## 2017-03-28 DIAGNOSIS — Z7901 Long term (current) use of anticoagulants: Secondary | ICD-10-CM | POA: Diagnosis not present

## 2017-03-28 DIAGNOSIS — I110 Hypertensive heart disease with heart failure: Secondary | ICD-10-CM | POA: Diagnosis not present

## 2017-03-30 DIAGNOSIS — I82409 Acute embolism and thrombosis of unspecified deep veins of unspecified lower extremity: Secondary | ICD-10-CM | POA: Diagnosis not present

## 2017-03-30 DIAGNOSIS — D509 Iron deficiency anemia, unspecified: Secondary | ICD-10-CM | POA: Diagnosis not present

## 2017-03-30 DIAGNOSIS — Z7901 Long term (current) use of anticoagulants: Secondary | ICD-10-CM | POA: Diagnosis not present

## 2017-03-30 DIAGNOSIS — I110 Hypertensive heart disease with heart failure: Secondary | ICD-10-CM | POA: Diagnosis not present

## 2017-03-30 DIAGNOSIS — E876 Hypokalemia: Secondary | ICD-10-CM | POA: Diagnosis not present

## 2017-03-30 DIAGNOSIS — J449 Chronic obstructive pulmonary disease, unspecified: Secondary | ICD-10-CM | POA: Diagnosis not present

## 2017-03-31 ENCOUNTER — Other Ambulatory Visit: Payer: Self-pay | Admitting: *Deleted

## 2017-03-31 DIAGNOSIS — I82409 Acute embolism and thrombosis of unspecified deep veins of unspecified lower extremity: Secondary | ICD-10-CM

## 2017-03-31 LAB — OCCULT BLOOD X 1 CARD TO LAB, STOOL
FECAL OCCULT BLD: POSITIVE — AB
Fecal Occult Bld: POSITIVE — AB
Fecal Occult Bld: POSITIVE — AB

## 2017-04-01 ENCOUNTER — Ambulatory Visit: Payer: Medicare Other | Admitting: Oncology

## 2017-04-01 ENCOUNTER — Other Ambulatory Visit: Payer: Self-pay | Admitting: Oncology

## 2017-04-01 DIAGNOSIS — R195 Other fecal abnormalities: Secondary | ICD-10-CM

## 2017-04-03 ENCOUNTER — Ambulatory Visit (INDEPENDENT_AMBULATORY_CARE_PROVIDER_SITE_OTHER): Payer: Medicare Other | Admitting: Vascular Surgery

## 2017-04-03 ENCOUNTER — Other Ambulatory Visit: Payer: Self-pay | Admitting: *Deleted

## 2017-04-03 DIAGNOSIS — I82409 Acute embolism and thrombosis of unspecified deep veins of unspecified lower extremity: Secondary | ICD-10-CM

## 2017-04-21 ENCOUNTER — Inpatient Hospital Stay: Payer: Medicare Other | Attending: Oncology | Admitting: Oncology

## 2017-04-21 ENCOUNTER — Encounter: Payer: Self-pay | Admitting: Oncology

## 2017-04-21 ENCOUNTER — Inpatient Hospital Stay: Payer: Medicare Other

## 2017-04-21 VITALS — BP 129/67 | HR 85 | Temp 98.5°F | Wt 199.0 lb

## 2017-04-21 DIAGNOSIS — Z87891 Personal history of nicotine dependence: Secondary | ICD-10-CM | POA: Diagnosis not present

## 2017-04-21 DIAGNOSIS — Z79899 Other long term (current) drug therapy: Secondary | ICD-10-CM

## 2017-04-21 DIAGNOSIS — I110 Hypertensive heart disease with heart failure: Secondary | ICD-10-CM | POA: Insufficient documentation

## 2017-04-21 DIAGNOSIS — E876 Hypokalemia: Secondary | ICD-10-CM | POA: Diagnosis not present

## 2017-04-21 DIAGNOSIS — Z7982 Long term (current) use of aspirin: Secondary | ICD-10-CM | POA: Diagnosis not present

## 2017-04-21 DIAGNOSIS — Z89512 Acquired absence of left leg below knee: Secondary | ICD-10-CM | POA: Diagnosis not present

## 2017-04-21 DIAGNOSIS — D509 Iron deficiency anemia, unspecified: Secondary | ICD-10-CM

## 2017-04-21 DIAGNOSIS — I739 Peripheral vascular disease, unspecified: Secondary | ICD-10-CM | POA: Insufficient documentation

## 2017-04-21 DIAGNOSIS — Z7901 Long term (current) use of anticoagulants: Secondary | ICD-10-CM | POA: Insufficient documentation

## 2017-04-21 DIAGNOSIS — G4700 Insomnia, unspecified: Secondary | ICD-10-CM | POA: Insufficient documentation

## 2017-04-21 DIAGNOSIS — I82402 Acute embolism and thrombosis of unspecified deep veins of left lower extremity: Secondary | ICD-10-CM | POA: Insufficient documentation

## 2017-04-21 DIAGNOSIS — I82409 Acute embolism and thrombosis of unspecified deep veins of unspecified lower extremity: Secondary | ICD-10-CM

## 2017-04-21 DIAGNOSIS — D500 Iron deficiency anemia secondary to blood loss (chronic): Secondary | ICD-10-CM | POA: Insufficient documentation

## 2017-04-21 DIAGNOSIS — J449 Chronic obstructive pulmonary disease, unspecified: Secondary | ICD-10-CM | POA: Insufficient documentation

## 2017-04-21 DIAGNOSIS — D6862 Lupus anticoagulant syndrome: Secondary | ICD-10-CM | POA: Diagnosis not present

## 2017-04-21 DIAGNOSIS — I509 Heart failure, unspecified: Secondary | ICD-10-CM | POA: Insufficient documentation

## 2017-04-21 HISTORY — DX: Iron deficiency anemia, unspecified: D50.9

## 2017-04-21 LAB — CBC WITH DIFFERENTIAL/PLATELET
Basophils Absolute: 0.1 10*3/uL (ref 0–0.1)
Basophils Relative: 1 %
Eosinophils Absolute: 0.3 10*3/uL (ref 0–0.7)
Eosinophils Relative: 2 %
HEMATOCRIT: 35.9 % — AB (ref 40.0–52.0)
HEMOGLOBIN: 11.8 g/dL — AB (ref 13.0–18.0)
LYMPHS ABS: 2.1 10*3/uL (ref 1.0–3.6)
Lymphocytes Relative: 18 %
MCH: 25.5 pg — ABNORMAL LOW (ref 26.0–34.0)
MCHC: 33 g/dL (ref 32.0–36.0)
MCV: 77.4 fL — ABNORMAL LOW (ref 80.0–100.0)
MONO ABS: 0.8 10*3/uL (ref 0.2–1.0)
MONOS PCT: 7 %
NEUTROS ABS: 8.5 10*3/uL — AB (ref 1.4–6.5)
NEUTROS PCT: 72 %
Platelets: 310 10*3/uL (ref 150–440)
RBC: 4.63 MIL/uL (ref 4.40–5.90)
RDW: 17.4 % — AB (ref 11.5–14.5)
WBC: 11.8 10*3/uL — ABNORMAL HIGH (ref 3.8–10.6)

## 2017-04-21 NOTE — Progress Notes (Signed)
[facility] Initial Visit:  Patient Care Team: Alvester Morin, MD as PCP - General (Family Medicine)  CHIEF COMPLAINTS/PURPOSE OF CONSULTATION: Recurrent DVT  HISTORY OF PRESENTING ILLNESS: Timothy Juarez 66 y.o. male with PMH listed as below is here for evaluation of recurrent DVT and management. Patient is accompanied by RN from [facility].  Patient is a poor historian. He remembers having leg clots for more than one time. He remembers taking Eliquis in the past and recently his anticoagulation has been switched to [place].  Reviewed the medical records from nursing home by patient's PCP Dr.Slade-Hartman, patient had been on Eliquis 5mg  BID for previous DVTs. His medical records showed he had acute DVT at the left common femoral vein on 11/26/2016, and he was placed on Eliquis 2.5mg  BID. Later when another venous doppler was obtained on 02/15/2017 due to persistent pain and swelling, doppler showed persist deep vein thrombosis involving proxima to distal femoral vein. Common femoral vein has normal compression. There was a note on the second doppler report on 03/03/2017 that patient was taking Eliquis 5mg  BID, which was stopped on 7/19 and patient was placed on Lovenox. Patient had swelling and pain of his left stump and symptoms does not improve with being on Lovenox. Xarelto 15mg  BID was started on 03/05/2017 and Lovenox was discontinued on 03/09/2017 with a plan to switch to Xarelto 20mg  daily after 21 days.  It was mentioned in nursing home note that patient had been on Warfarin previously and acquired blood clot while on warfarin. Patient does not remember this and the details of his?warfarin resistance is unclear.    INTERVAL HISTORY Today patient came back for follow-up. He has been on Xarelto 20mg  daily.  He reports his left stump pain and swelling has dramatically improved No more pain today. He denies any bleeding events.he was  seen by vascular surgeon Dr. Lucky Cowboy and CT anginal abdomen pelvis was ordered and not being done yet.    Review of Systems  Constitutional: Negative.   HENT:  Negative.   Eyes: Negative.   Respiratory: Negative.   Cardiovascular: Negative.   Gastrointestinal: Negative.   Endocrine: Negative.   Genitourinary: Negative.    Musculoskeletal:       Left stump pain and swelling much improved.   Skin: Negative.   Neurological: Negative.   Hematological: Negative.   Psychiatric/Behavioral: Negative.     MEDICAL HISTORY: Past Medical History:  Diagnosis Date  . Allergy   . Anemia   . ARF (acute respiratory failure) (HCC)    H/O  . CHF (congestive heart failure) ([place])   . COPD (chronic obstructive pulmonary disease) ([place])   . Hypertension   . Hypokalemia   . Insomnia   . Muscle contracture    MUSCLE SPASMS  . Peripheral vascular disease ([place])   . Pressure ulcer     SURGICAL HISTORY: Past Surgical History:  Procedure Laterality Date  . AMPUTATION Left 09/19/2015   Procedure: AMPUTATION BELOW KNEE;  Surgeon: Algernon Huxley, MD;  Location: [facility];  Service: Vascular;  Laterality: Left;  . AMPUTATION Left 11/01/2015   Procedure: AMPUTATION ABOVE KNEE;  Surgeon: Algernon Huxley, MD;  Location: [facility];  Service: Vascular;  Laterality: Left;  . APPLICATION OF WOUND VAC Left 10/18/2015   Procedure: APPLICATION OF WOUND VAC;  Surgeon: Algernon Huxley, MD;  Location: [facility];  Service: Vascular;  Laterality: Left;  . PERIPHERAL VASCULAR CATHETERIZATION Left 01/18/2015  Procedure: Lower Extremity Angiography;  Surgeon: Algernon Huxley, MD;  Location: [place] [facility];  Service: Cardiovascular;  Laterality: Left;  . PERIPHERAL VASCULAR CATHETERIZATION N/A 01/18/2015   Procedure: Lower Extremity Intervention;  Surgeon: Algernon Huxley, MD;  Location: [place] [facility];  Service: Cardiovascular;  Laterality: N/A;  . PERIPHERAL VASCULAR CATHETERIZATION  07/30/2015   Procedure: Lower Extremity Intervention;   Surgeon: Algernon Huxley, MD;  Location: [place] [facility];  Service: Cardiovascular;;  . PERIPHERAL VASCULAR CATHETERIZATION N/A 07/30/2015   Procedure: Abdominal Aortogram w/Lower Extremity;  Surgeon: Algernon Huxley, MD;  Location: [place] [facility];  Service: Cardiovascular;  Laterality: N/A;  . PERIPHERAL VASCULAR CATHETERIZATION Left 08/22/2015   Procedure: Lower Extremity Angiography;  Surgeon: Algernon Huxley, MD;  Location: [place] [facility];  Service: Cardiovascular;  Laterality: Left;  . PERIPHERAL VASCULAR CATHETERIZATION Left 08/22/2015   Procedure: Lower Extremity Intervention;  Surgeon: Algernon Huxley, MD;  Location: [place] [facility];  Service: Cardiovascular;  Laterality: Left;  . PERIPHERAL VASCULAR CATHETERIZATION Right 03/31/2016   Procedure: Lower Extremity Angiography;  Surgeon: Algernon Huxley, MD;  Location: [place] [facility];  Service: Cardiovascular;  Laterality: Right;  . PERIPHERAL VASCULAR CATHETERIZATION  03/31/2016   Procedure: Lower Extremity Intervention;  Surgeon: Algernon Huxley, MD;  Location: [place] [facility];  Service: Cardiovascular;;  . PERIPHERAL VASCULAR CATHETERIZATION Left 04/10/2016   Procedure: Lower Extremity Angiography;  Surgeon: Algernon Huxley, MD;  Location: [place] [facility];  Service: Cardiovascular;  Laterality: Left;  . WOUND DEBRIDEMENT Left 10/18/2015   Procedure: DEBRIDEMENT WOUND   ( LEFT BKA DEBRIDEMENT );  Surgeon: Algernon Huxley, MD;  Location: [facility];  Service: Vascular;  Laterality: Left;    SOCIAL HISTORY: Social History   Social History  . Marital status: Legally Separated    Spouse name: N/A  . Number of children: N/A  . Years of education: N/A   Occupational History  . Not on file.   Social History Main Topics  . Smoking status: Former Smoker    Packs/day: 1.00    Years: 44.00    Types: Cigarettes    Quit date: 11/17/2012  . Smokeless tobacco: Never Used  . Alcohol use No  . Drug use: No  . Sexual activity: Not on file    Other Topics Concern  . Not on file   Social History Narrative  . No narrative on file    FAMILY HISTORY History reviewed. No pertinent family history.  ALLERGIES:  has No Known Allergies.  MEDICATIONS:  Current Outpatient Prescriptions  Medication Sig Dispense Refill  . acetaminophen (TYLENOL) 325 MG tablet Take 650 mg by mouth at bedtime.     [name] aspirin EC 81 MG EC tablet Take 1 tablet (81 mg total) by mouth daily. 30 tablet 11  . baclofen (LIORESAL) 10 MG tablet Take 10 mg by mouth 3 (three) times daily. Hold for sedation    . budesonide-formoterol (SYMBICORT) 80-4.5 MCG/ACT inhaler Inhale 2 puffs into the lungs 2 (two) times daily.    . fentaNYL (DURAGESIC - DOSED MCG/HR) 12 MCG/HR     . furosemide (LASIX) 40 MG tablet Take 40 mg by mouth 2 (two) times daily.     [name] gabapentin (NEURONTIN) 100 MG capsule Take 100 mg by mouth at bedtime.    . iron polysaccharides (NIFEREX) 150 MG capsule Take 1 capsule (150 mg total) by mouth 2 (two) times daily. 60 capsule 3  .  Melatonin 5 MG CAPS Take 1 capsule by mouth at bedtime as needed.    . metoprolol tartrate (LOPRESSOR) 25 MG tablet Take 0.5 tablets (12.5 mg total) by mouth 2 (two) times daily. 60 tablet 5  . Omega-3 Fatty Acids (FISH OIL) 1000 MG CAPS Take 1,000 mg by mouth every morning.    . Oxycodone HCl 10 MG TABS Take 10 mg by mouth 3 (three) times daily.    . potassium chloride (K-DUR) 10 MEQ tablet Take 20 mEq by mouth daily.     [name] tiotropium (SPIRIVA) 18 MCG inhalation capsule Place 18 mcg into inhaler and inhale daily.    Alveda Reasons 20 MG TABS tablet     . ADVAIR DISKUS 250-50 MCG/DOSE AEPB      No current facility-administered medications for this visit.     PHYSICAL EXAMINATION:  ECOG PERFORMANCE STATUS: 1 - Symptomatic but completely ambulatory   Vitals:   04/21/17 1050  BP: 129/67  Pulse: 85  Temp: 98.5 F (36.9 C)    Filed Weights   04/21/17 1050  Weight: 199 lb (90.3 kg)     Physical Exam GENERAL: No  distress, well nourished.  SKIN:  No rashes or significant lesions  HEAD: Normocephalic, No masses, lesions, tenderness or abnormalities  EYES: Conjunctiva are pink, non icteric ENT: External ears normal ,lips , buccal mucosa, and tongue normal and mucous membranes are moist  LYMPH: No palpable cervical and axillary lymphadenopathy  LUNGS: Clear to auscultation, no crackles or wheezes HEART: Regular rate & rhythm, no murmurs, no gallops, S1 normal and S2 normal  ABDOMEN: Abdomen soft, non-tender, normal bowel sounds, I did not appreciate any  masses or organomegaly  MUSCULOSKELETAL: No CVA tenderness and no tenderness on percussion of the back or rib cage.  EXTREMITIES: left above knee amputation stump non tender. Swelling has improved since last visit. No edema at right lower extremity.  NEURO: Alert & oriented, no focal motor/sensory deficits.   LABORATORY DATA: I have personally reviewed the data as listed:  Appointment on 04/21/2017  Component Date Value Ref Range Status  . WBC 04/21/2017 11.8* 3.8 - 10.6 K/uL Final  . RBC 04/21/2017 4.63  4.40 - 5.90 MIL/uL Final  . Hemoglobin 04/21/2017 11.8* 13.0 - 18.0 g/dL Final  . HCT 04/21/2017 35.9* 40.0 - 52.0 % Final  . MCV 04/21/2017 77.4* 80.0 - 100.0 fL Final  . MCH 04/21/2017 25.5* 26.0 - 34.0 pg Final  . MCHC 04/21/2017 33.0  32.0 - 36.0 g/dL Final  . RDW 04/21/2017 17.4* 11.5 - 14.5 % Final  . Platelets 04/21/2017 310  150 - 440 K/uL Final  . Neutrophils Relative % 04/21/2017 72  % Final  . Neutro Abs 04/21/2017 8.5* 1.4 - 6.5 K/uL Final  . Lymphocytes Relative 04/21/2017 18  % Final  . Lymphs Abs 04/21/2017 2.1  1.0 - 3.6 K/uL Final  . Monocytes Relative 04/21/2017 7  % Final  . Monocytes Absolute 04/21/2017 0.8  0.2 - 1.0 K/uL Final  . Eosinophils Relative 04/21/2017 2  % Final  . Eosinophils Absolute 04/21/2017 0.3  0 - 0.7 K/uL Final  . Basophils Relative 04/21/2017 1  % Final  . Basophils Absolute 04/21/2017 0.1  0 - 0.1  K/uL Final  Orders Only on 03/31/2017  Component Date Value Ref Range Status  . Fecal Occult Bld 03/27/2017 POSITIVE* NEGATIVE Final  . Fecal Occult Bld 03/28/2017 POSITIVE* NEGATIVE Final  . Fecal Occult Bld 03/30/2017 POSITIVE* NEGATIVE Final  Appointment on 03/23/2017  Component Date Value Ref Range Status  . Hgb A2 Quant 03/23/2017 2.5  1.8 - 3.2 % Final  . Hgb F Quant 03/23/2017 0.0  0.0 - 2.0 % Final  . Hgb S Quant 03/23/2017 0.0  0.0 % Final  . Hgb C 03/23/2017 0.0  0.0 % Corrected  . Hgb A 03/23/2017 97.5  96.4 - 98.8 % Final  . Hgb Variant 03/23/2017 0.0  0.0 % Corrected  . Please Note: 03/23/2017 Comment   Corrected   Comment: (NOTE) Normal adult hemoglobin present. Performed At: [facility] [place], [place] [id number] Lindon Romp MD RC:[id number]     RADIOGRAPHIC STUDIES: I have personally reviewed the radiological images as listed and agree with the findings in the report 04/03/2017 US venous doppler IMPRESSION: Left inguinal/femoral partially thrombosed pseudoaneurysm/hematoma measuring 15 x 5.3 x 6.8 cm.  Reviewed image records from [facility] home 11/26/2016 patient had a venous Doppler extremity left done which showed no occluding acute DVT at the left common femoral vein. 03/03/2017 patient had another venous Doppler extremity left which showed deep vein thromboses involving the proximal to distal femoral vein the common femoral artery now has normal compression.  04/05/2013 CT chest with contrast    Chronic pulmonary disease with areas of atelectasis and bronchiectasis. Continued significant opacification and bullous change in the left lung especially in the upper lobe and superior segment of the  left lower lobe with other patchy areas bilaterally. There may be mild interval improvement in the area of the lower lingula. Malignancy cannot be excluded in the abnormal areas of the lungs. No central pulmonary arterial  filling defect is seen. The bolus is not optimal for evaluation of the lungs    otherwise.  ASSESSMENT/PLAN 1. Recurrent deep vein thrombosis (DVT) ([place])   2. Iron deficiency anemia, unspecified iron deficiency anemia type   3. PAD (peripheral artery disease) ([place])   4. Iron deficiency anemia due to chronic blood loss   . # Continue Xarelto for anticoagulation. (patient preference). Lupus anticoagulant can be false positive due to Xarelto.  # Hb trending down despite being on oral iron supplementation. Stool occult positive. Plan IV venofer weekly x 4 doses. Side effects including allergic reactions/anaphylactic reactions discussed with patient and he is willing to proceed.  # Chronic blood loss from GI: patient doesn't remember when was his last colonoscopy. Referred to GI.  Orders Placed This Encounter  Procedures  . CBC with Differential/Platelet    Standing Status:   Future    Standing Expiration Date:   04/21/2018  . Iron and TIBC    Standing Status:   Future    Standing Expiration Date:   04/21/2018  . Ferritin    Standing Status:   Future    Standing Expiration Date:   04/21/2018  . Ambulatory referral to Gastroenterology    Referral Priority:   Routine    Referral Type:   Consultation    Referral Reason:   Specialty Services Required    Referred to Provider:   Jonathon Bellows, MD    Number of Visits Requested:   1    All questions were answered. The patient knows to call the clinic with any problems, questions or concerns. Total face to face encounter time for this patient visit was 25 min. >50% of the time was  spent in counseling and coordination of care.  See me in 5 weeks. Repeat CBC iron TIBC, ferritin.  Earlie Server, MD  04/21/2017 3:42 PM

## 2017-04-24 ENCOUNTER — Inpatient Hospital Stay: Payer: Medicare Other

## 2017-04-24 VITALS — BP 113/68 | HR 65

## 2017-04-24 DIAGNOSIS — D500 Iron deficiency anemia secondary to blood loss (chronic): Secondary | ICD-10-CM

## 2017-04-24 DIAGNOSIS — I82402 Acute embolism and thrombosis of unspecified deep veins of left lower extremity: Secondary | ICD-10-CM | POA: Diagnosis not present

## 2017-04-24 DIAGNOSIS — D509 Iron deficiency anemia, unspecified: Secondary | ICD-10-CM | POA: Diagnosis not present

## 2017-04-24 DIAGNOSIS — Z7901 Long term (current) use of anticoagulants: Secondary | ICD-10-CM | POA: Diagnosis not present

## 2017-04-24 DIAGNOSIS — I739 Peripheral vascular disease, unspecified: Secondary | ICD-10-CM | POA: Diagnosis not present

## 2017-04-24 DIAGNOSIS — I110 Hypertensive heart disease with heart failure: Secondary | ICD-10-CM | POA: Diagnosis not present

## 2017-04-24 MED ORDER — SODIUM CHLORIDE 0.9 % IV SOLN: 200.0000 mg | Freq: Once | INTRAVENOUS | Status: DC

## 2017-04-24 MED ORDER — IRON SUCROSE 20 MG/ML IV SOLN
200.0000 mg | Freq: Once | INTRAVENOUS | Status: AC
  Administered 2017-04-24: 200 mg via INTRAVENOUS
  Filled 2017-04-24: qty 10

## 2017-04-24 MED ORDER — SODIUM CHLORIDE 0.9 % IV SOLN
Freq: Once | INTRAVENOUS | Status: AC
  Administered 2017-04-24: 14:00:00 via INTRAVENOUS
  Filled 2017-04-24: qty 1000

## 2017-05-01 ENCOUNTER — Inpatient Hospital Stay: Payer: Medicare Other

## 2017-05-01 VITALS — BP 123/73 | HR 75 | Temp 96.2°F | Resp 20

## 2017-05-01 DIAGNOSIS — I739 Peripheral vascular disease, unspecified: Secondary | ICD-10-CM | POA: Diagnosis not present

## 2017-05-01 DIAGNOSIS — Z7901 Long term (current) use of anticoagulants: Secondary | ICD-10-CM | POA: Diagnosis not present

## 2017-05-01 DIAGNOSIS — D500 Iron deficiency anemia secondary to blood loss (chronic): Secondary | ICD-10-CM | POA: Diagnosis not present

## 2017-05-01 DIAGNOSIS — I82402 Acute embolism and thrombosis of unspecified deep veins of left lower extremity: Secondary | ICD-10-CM | POA: Diagnosis not present

## 2017-05-01 DIAGNOSIS — D509 Iron deficiency anemia, unspecified: Secondary | ICD-10-CM | POA: Diagnosis not present

## 2017-05-01 DIAGNOSIS — I110 Hypertensive heart disease with heart failure: Secondary | ICD-10-CM | POA: Diagnosis not present

## 2017-05-01 MED ORDER — SODIUM CHLORIDE 0.9 % IV SOLN
Freq: Once | INTRAVENOUS | Status: AC
  Administered 2017-05-01: 14:00:00 via INTRAVENOUS
  Filled 2017-05-01: qty 1000

## 2017-05-01 MED ORDER — IRON SUCROSE 20 MG/ML IV SOLN
200.0000 mg | Freq: Once | INTRAVENOUS | Status: AC
  Administered 2017-05-01: 200 mg via INTRAVENOUS
  Filled 2017-05-01: qty 10

## 2017-05-07 DIAGNOSIS — I5189 Other ill-defined heart diseases: Secondary | ICD-10-CM | POA: Diagnosis not present

## 2017-05-07 DIAGNOSIS — J984 Other disorders of lung: Secondary | ICD-10-CM | POA: Diagnosis not present

## 2017-05-07 DIAGNOSIS — D508 Other iron deficiency anemias: Secondary | ICD-10-CM | POA: Diagnosis not present

## 2017-05-07 DIAGNOSIS — I82402 Acute embolism and thrombosis of unspecified deep veins of left lower extremity: Secondary | ICD-10-CM | POA: Diagnosis not present

## 2017-05-08 ENCOUNTER — Inpatient Hospital Stay: Payer: Medicare Other

## 2017-05-08 VITALS — BP 126/72 | HR 59 | Temp 97.3°F | Resp 20

## 2017-05-08 DIAGNOSIS — D500 Iron deficiency anemia secondary to blood loss (chronic): Secondary | ICD-10-CM

## 2017-05-08 DIAGNOSIS — D509 Iron deficiency anemia, unspecified: Secondary | ICD-10-CM | POA: Diagnosis not present

## 2017-05-08 DIAGNOSIS — I739 Peripheral vascular disease, unspecified: Secondary | ICD-10-CM | POA: Diagnosis not present

## 2017-05-08 DIAGNOSIS — Z7901 Long term (current) use of anticoagulants: Secondary | ICD-10-CM | POA: Diagnosis not present

## 2017-05-08 DIAGNOSIS — I82402 Acute embolism and thrombosis of unspecified deep veins of left lower extremity: Secondary | ICD-10-CM | POA: Diagnosis not present

## 2017-05-08 DIAGNOSIS — I110 Hypertensive heart disease with heart failure: Secondary | ICD-10-CM | POA: Diagnosis not present

## 2017-05-08 MED ORDER — IRON SUCROSE 20 MG/ML IV SOLN
200.0000 mg | Freq: Once | INTRAVENOUS | Status: AC
  Administered 2017-05-08: 200 mg via INTRAVENOUS
  Filled 2017-05-08: qty 10

## 2017-05-08 MED ORDER — SODIUM CHLORIDE 0.9 % IV SOLN
Freq: Once | INTRAVENOUS | Status: AC
Start: 2017-05-08 — End: 2017-05-08
  Administered 2017-05-08: 14:00:00 via INTRAVENOUS
  Filled 2017-05-08: qty 1000

## 2017-05-12 ENCOUNTER — Encounter (INDEPENDENT_AMBULATORY_CARE_PROVIDER_SITE_OTHER): Payer: Self-pay

## 2017-05-12 ENCOUNTER — Ambulatory Visit (INDEPENDENT_AMBULATORY_CARE_PROVIDER_SITE_OTHER): Payer: Medicare Other | Admitting: Gastroenterology

## 2017-05-12 ENCOUNTER — Other Ambulatory Visit: Payer: Self-pay

## 2017-05-12 ENCOUNTER — Encounter: Payer: Self-pay | Admitting: Gastroenterology

## 2017-05-12 VITALS — BP 129/73 | HR 59 | Temp 98.3°F | Wt 202.0 lb

## 2017-05-12 DIAGNOSIS — D509 Iron deficiency anemia, unspecified: Secondary | ICD-10-CM

## 2017-05-12 DIAGNOSIS — I70235 Atherosclerosis of native arteries of right leg with ulceration of other part of foot: Secondary | ICD-10-CM | POA: Diagnosis not present

## 2017-05-12 NOTE — Addendum Note (Signed)
Addended by: Peggye Ley on: 05/12/2017 01:56 PM   Modules accepted: Orders

## 2017-05-12 NOTE — Progress Notes (Signed)
Jonathon Bellows MD, MRCP(U.K) [address]  [place]  [place], [place] [address]  Main: [phone]  Fax: [phone]   Gastroenterology Consultation  Referring Provider:     Earlie Server, MD Primary Care Physician:  Alvester Morin, MD Primary Gastroenterologist:  Dr. Jonathon Bellows  Reason for Consultation:     Blood in stool         HPI:   Timothy Juarez is a 66 y.o. y/o male referred for consultation & management  by Dr Tasia Catchings. He has a history of recurrent DVT , lives at a nursing home , on eloquis . Recent labs suggest microcytic anemia. FOBT positive x 3 . He is being evaluated for iron deficiency anemia. Hb has been trending down .He denies any change in bowel habits, no blood in stool , never had a colonoscopy . No family history of colon cancer. No weight loss. Cant recall which blood thinner he is on .    CBC Latest Ref Rng & Units 04/21/2017 03/11/2017 04/10/2016  WBC 3.8 - 10.6 K/uL 11.8(H) 12.8(H) 17.9(H)  Hemoglobin 13.0 - 18.0 g/dL 11.8(L) 12.6(L) 7.5(L)  Hematocrit 40.0 - 52.0 % 35.9(L) 38.3(L) 23.5(L)  Platelets 150 - 440 K/uL 310 319 601(H)     Past Medical History:  Diagnosis Date  . Allergy   . Anemia   . ARF (acute respiratory failure) (HCC)    H/O  . CHF (congestive heart failure) ([place])   . COPD (chronic obstructive pulmonary disease) ([place])   . Hypertension   . Hypokalemia   . Insomnia   . Muscle contracture    MUSCLE SPASMS  . Peripheral vascular disease ([place])   . Pressure ulcer     Past Surgical History:  Procedure Laterality Date  . AMPUTATION Left 09/19/2015   Procedure: AMPUTATION BELOW KNEE;  Surgeon: Algernon Huxley, MD;  Location: [facility];  Service: Vascular;  Laterality: Left;  . AMPUTATION Left 11/01/2015   Procedure: AMPUTATION ABOVE KNEE;  Surgeon: Algernon Huxley, MD;  Location: [facility];  Service: Vascular;  Laterality: Left;  . APPLICATION OF WOUND VAC Left 10/18/2015   Procedure: APPLICATION OF WOUND VAC;  Surgeon: Algernon Huxley, MD;   Location: [facility];  Service: Vascular;  Laterality: Left;  . PERIPHERAL VASCULAR CATHETERIZATION Left 01/18/2015   Procedure: Lower Extremity Angiography;  Surgeon: Algernon Huxley, MD;  Location: [place] [facility];  Service: Cardiovascular;  Laterality: Left;  . PERIPHERAL VASCULAR CATHETERIZATION N/A 01/18/2015   Procedure: Lower Extremity Intervention;  Surgeon: Algernon Huxley, MD;  Location: [place] [facility];  Service: Cardiovascular;  Laterality: N/A;  . PERIPHERAL VASCULAR CATHETERIZATION  07/30/2015   Procedure: Lower Extremity Intervention;  Surgeon: Algernon Huxley, MD;  Location: [place] [facility];  Service: Cardiovascular;;  . PERIPHERAL VASCULAR CATHETERIZATION N/A 07/30/2015   Procedure: Abdominal Aortogram w/Lower Extremity;  Surgeon: Algernon Huxley, MD;  Location: [place] [facility];  Service: Cardiovascular;  Laterality: N/A;  . PERIPHERAL VASCULAR CATHETERIZATION Left 08/22/2015   Procedure: Lower Extremity Angiography;  Surgeon: Algernon Huxley, MD;  Location: [place] [facility];  Service: Cardiovascular;  Laterality: Left;  . PERIPHERAL VASCULAR CATHETERIZATION Left 08/22/2015   Procedure: Lower Extremity Intervention;  Surgeon: Algernon Huxley, MD;  Location: [place] [facility];  Service: Cardiovascular;  Laterality: Left;  . PERIPHERAL VASCULAR CATHETERIZATION Right 03/31/2016   Procedure: Lower Extremity Angiography;  Surgeon: Algernon Huxley, MD;  Location: [place] [facility];  Service: Cardiovascular;  Laterality: Right;  . PERIPHERAL VASCULAR CATHETERIZATION  03/31/2016   Procedure: Lower Extremity Intervention;  Surgeon: Algernon Huxley, MD;  Location: [place] [facility];  Service: Cardiovascular;;  . PERIPHERAL VASCULAR CATHETERIZATION Left 04/10/2016   Procedure: Lower Extremity Angiography;  Surgeon: Algernon Huxley, MD;  Location: [place] [facility];  Service: Cardiovascular;  Laterality: Left;  . WOUND DEBRIDEMENT Left 10/18/2015   Procedure: DEBRIDEMENT WOUND   ( LEFT BKA DEBRIDEMENT );   Surgeon: Algernon Huxley, MD;  Location: [facility];  Service: Vascular;  Laterality: Left;    Prior to Admission medications   Medication Sig Start Date End Date Taking? Authorizing Provider  acetaminophen (TYLENOL) 325 MG tablet Take 650 mg by mouth at bedtime.     [provider]  ADVAIR DISKUS 250-50 MCG/DOSE AEPB  12/15/16   [provider]  aspirin EC 81 MG EC tablet Take 1 tablet (81 mg total) by mouth daily. 09/24/15   Algernon Huxley, MD  baclofen (LIORESAL) 10 MG tablet Take 10 mg by mouth 3 (three) times daily. Hold for sedation    [provider]  budesonide-formoterol (SYMBICORT) 80-4.5 MCG/ACT inhaler Inhale 2 puffs into the lungs 2 (two) times daily.    [provider]  fentaNYL (DURAGESIC - DOSED MCG/HR) 12 MCG/HR  10/28/16   [provider]  furosemide (LASIX) 40 MG tablet Take 40 mg by mouth 2 (two) times daily.     [provider]  gabapentin (NEURONTIN) 100 MG capsule Take 100 mg by mouth at bedtime.    [provider]  iron polysaccharides (NIFEREX) 150 MG capsule Take 1 capsule (150 mg total) by mouth 2 (two) times daily. 03/23/17   Earlie Server, MD  Melatonin 5 MG CAPS Take 1 capsule by mouth at bedtime as needed.    [provider]  metoprolol tartrate (LOPRESSOR) 25 MG tablet Take 0.5 tablets (12.5 mg total) by mouth 2 (two) times daily. 09/24/15   Algernon Huxley, MD  Omega-3 Fatty Acids (FISH OIL) 1000 MG CAPS Take 1,000 mg by mouth every morning.    [provider]  Oxycodone HCl 10 MG TABS Take 10 mg by mouth 3 (three) times daily.    [provider]  potassium chloride (K-DUR) 10 MEQ tablet Take 20 mEq by mouth daily.     [provider]  tiotropium (SPIRIVA) 18 MCG inhalation capsule Place 18 mcg into inhaler and inhale daily.    [provider]  XARELTO 20 MG TABS tablet  04/16/17   [provider]    No family history on file.   Social History  Substance Use Topics    . Smoking status: Former Smoker    Packs/day: 1.00    Years: 44.00    Types: Cigarettes    Quit date: 11/17/2012  . Smokeless tobacco: Never Used  . Alcohol use No    Allergies as of 05/12/2017  . (No Known Allergies)    Review of Systems:    All systems reviewed and negative except where noted in HPI.   Physical Exam:  There were no vitals taken for this visit. No LMP for male patient. Psych:  Alert and cooperative. Normal mood and affect.In a wheelchair  General:   Alert,  Well-developed, well-nourished, pleasant and cooperative in NAD Head:  Normocephalic and atraumatic. Eyes:  Sclera clear, no icterus.   Conjunctiva pink. Ears:  Normal auditory acuity. Nose:  No deformity, discharge, or lesions. Mouth:  No deformity  or lesions,oropharynx pink & moist. Neck:  Supple; no masses or thyromegaly. Lungs:  Respirations even and unlabored.  Clear throughout to auscultation.   No wheezes, crackles, or rhonchi. No acute distress. Heart:  Regular rate and rhythm; no murmurs, clicks, rubs, or gallops. Abdomen:  Normal bowel sounds.  No bruits.  Soft, non-tender and non-distended without masses, hepatosplenomegaly or hernias noted.  No guarding or rebound tenderness.    Extremities:  Left AKA Neurologic:  Alert and oriented x3;  grossly normal neurologically. Skin:  Intact without significant lesions or rashes. No jaundice. Lymph Nodes:  No significant cervical adenopathy. Psych:  Alert and cooperative. Normal mood and affect.  Imaging Studies: No results found.  Assessment and Plan:   KESTER STIMPSON is a 66 y.o. y/o male has been referred for iron deficiency anemia and FOBT positive x 3 . Follows with Dr Tasia Catchings for iron deficiency anemia and recurrent DVT's.   Plan  1. Celiac serology 2. EGD+colonoscopy to evaluate anemia. If negative will need a capsule study of the small bowel .  3. Xarelto holding instructions from Dr Tasia Catchings office   I have discussed alternative options, risks &  benefits,  which include, but are not limited to, bleeding, infection, perforation,respiratory complication & drug reaction.  The patient agrees with this plan & written consent will be obtained.    Follow up 3 months   Dr Jonathon Bellows MD,MRCP(U.K)

## 2017-05-13 ENCOUNTER — Telehealth: Payer: Self-pay | Admitting: Gastroenterology

## 2017-05-13 NOTE — Telephone Encounter (Signed)
Please call [name] at [facility] [phone] to reschedule patients colonoscopy. He has an infusion that day.

## 2017-05-14 NOTE — Telephone Encounter (Signed)
Nursing facility called to change patient's procedure from 10/2 to 10/8 due to infusion pre-set appointment.

## 2017-05-15 ENCOUNTER — Inpatient Hospital Stay: Payer: Medicare Other

## 2017-05-15 ENCOUNTER — Encounter (INDEPENDENT_AMBULATORY_CARE_PROVIDER_SITE_OTHER): Payer: Medicare Other

## 2017-05-15 ENCOUNTER — Ambulatory Visit (INDEPENDENT_AMBULATORY_CARE_PROVIDER_SITE_OTHER): Payer: Medicare Other | Admitting: Vascular Surgery

## 2017-05-15 ENCOUNTER — Other Ambulatory Visit: Payer: Self-pay | Admitting: Hematology and Oncology

## 2017-05-15 VITALS — BP 132/69 | HR 54 | Resp 20

## 2017-05-15 DIAGNOSIS — D500 Iron deficiency anemia secondary to blood loss (chronic): Secondary | ICD-10-CM | POA: Diagnosis not present

## 2017-05-15 DIAGNOSIS — I82402 Acute embolism and thrombosis of unspecified deep veins of left lower extremity: Secondary | ICD-10-CM | POA: Diagnosis not present

## 2017-05-15 DIAGNOSIS — I110 Hypertensive heart disease with heart failure: Secondary | ICD-10-CM | POA: Diagnosis not present

## 2017-05-15 DIAGNOSIS — I739 Peripheral vascular disease, unspecified: Secondary | ICD-10-CM | POA: Diagnosis not present

## 2017-05-15 DIAGNOSIS — D509 Iron deficiency anemia, unspecified: Secondary | ICD-10-CM | POA: Diagnosis not present

## 2017-05-15 DIAGNOSIS — Z7901 Long term (current) use of anticoagulants: Secondary | ICD-10-CM | POA: Diagnosis not present

## 2017-05-15 MED ORDER — IRON SUCROSE 20 MG/ML IV SOLN
200.0000 mg | Freq: Once | INTRAVENOUS | Status: AC
  Administered 2017-05-15: 200 mg via INTRAVENOUS
  Filled 2017-05-15: qty 10

## 2017-05-15 MED ORDER — SODIUM CHLORIDE 0.9 % IV SOLN
Freq: Once | INTRAVENOUS | Status: AC
  Administered 2017-05-15: 11:00:00 via INTRAVENOUS
  Filled 2017-05-15: qty 1000

## 2017-05-19 ENCOUNTER — Inpatient Hospital Stay: Payer: Medicare Other | Attending: Oncology | Admitting: Oncology

## 2017-05-19 ENCOUNTER — Encounter: Payer: Self-pay | Admitting: Oncology

## 2017-05-19 ENCOUNTER — Inpatient Hospital Stay: Payer: Medicare Other

## 2017-05-19 VITALS — BP 126/62 | HR 60 | Temp 97.0°F

## 2017-05-19 DIAGNOSIS — Z79899 Other long term (current) drug therapy: Secondary | ICD-10-CM | POA: Insufficient documentation

## 2017-05-19 DIAGNOSIS — I509 Heart failure, unspecified: Secondary | ICD-10-CM | POA: Insufficient documentation

## 2017-05-19 DIAGNOSIS — I739 Peripheral vascular disease, unspecified: Secondary | ICD-10-CM | POA: Diagnosis not present

## 2017-05-19 DIAGNOSIS — Z7901 Long term (current) use of anticoagulants: Secondary | ICD-10-CM | POA: Insufficient documentation

## 2017-05-19 DIAGNOSIS — Z5181 Encounter for therapeutic drug level monitoring: Secondary | ICD-10-CM

## 2017-05-19 DIAGNOSIS — K921 Melena: Secondary | ICD-10-CM | POA: Insufficient documentation

## 2017-05-19 DIAGNOSIS — J449 Chronic obstructive pulmonary disease, unspecified: Secondary | ICD-10-CM | POA: Diagnosis not present

## 2017-05-19 DIAGNOSIS — Z89512 Acquired absence of left leg below knee: Secondary | ICD-10-CM | POA: Insufficient documentation

## 2017-05-19 DIAGNOSIS — D509 Iron deficiency anemia, unspecified: Secondary | ICD-10-CM

## 2017-05-19 DIAGNOSIS — R195 Other fecal abnormalities: Secondary | ICD-10-CM

## 2017-05-19 DIAGNOSIS — I110 Hypertensive heart disease with heart failure: Secondary | ICD-10-CM | POA: Insufficient documentation

## 2017-05-19 DIAGNOSIS — Z7982 Long term (current) use of aspirin: Secondary | ICD-10-CM | POA: Diagnosis not present

## 2017-05-19 DIAGNOSIS — Z86718 Personal history of other venous thrombosis and embolism: Secondary | ICD-10-CM | POA: Diagnosis not present

## 2017-05-19 DIAGNOSIS — D500 Iron deficiency anemia secondary to blood loss (chronic): Secondary | ICD-10-CM | POA: Insufficient documentation

## 2017-05-19 DIAGNOSIS — D6862 Lupus anticoagulant syndrome: Secondary | ICD-10-CM | POA: Diagnosis not present

## 2017-05-19 DIAGNOSIS — I82409 Acute embolism and thrombosis of unspecified deep veins of unspecified lower extremity: Secondary | ICD-10-CM

## 2017-05-19 DIAGNOSIS — Z87891 Personal history of nicotine dependence: Secondary | ICD-10-CM | POA: Insufficient documentation

## 2017-05-19 DIAGNOSIS — G4700 Insomnia, unspecified: Secondary | ICD-10-CM | POA: Insufficient documentation

## 2017-05-19 DIAGNOSIS — E876 Hypokalemia: Secondary | ICD-10-CM | POA: Insufficient documentation

## 2017-05-19 DIAGNOSIS — J479 Bronchiectasis, uncomplicated: Secondary | ICD-10-CM | POA: Insufficient documentation

## 2017-05-19 LAB — CBC WITH DIFFERENTIAL/PLATELET
Basophils Absolute: 0.1 10*3/uL (ref 0–0.1)
Basophils Relative: 1 %
EOS ABS: 0.2 10*3/uL (ref 0–0.7)
Eosinophils Relative: 2 %
HCT: 35.4 % — ABNORMAL LOW (ref 40.0–52.0)
Hemoglobin: 11.4 g/dL — ABNORMAL LOW (ref 13.0–18.0)
LYMPHS ABS: 2 10*3/uL (ref 1.0–3.6)
LYMPHS PCT: 16 %
MCH: 25.5 pg — AB (ref 26.0–34.0)
MCHC: 32.2 g/dL (ref 32.0–36.0)
MCV: 79.4 fL — AB (ref 80.0–100.0)
MONOS PCT: 6 %
Monocytes Absolute: 0.7 10*3/uL (ref 0.2–1.0)
NEUTROS PCT: 75 %
Neutro Abs: 9.3 10*3/uL — ABNORMAL HIGH (ref 1.4–6.5)
Platelets: 323 10*3/uL (ref 150–440)
RBC: 4.47 MIL/uL (ref 4.40–5.90)
RDW: 17.4 % — ABNORMAL HIGH (ref 11.5–14.5)
WBC: 12.3 10*3/uL — ABNORMAL HIGH (ref 3.8–10.6)

## 2017-05-19 LAB — IRON AND TIBC
IRON: 31 ug/dL — AB (ref 45–182)
Saturation Ratios: 10 % — ABNORMAL LOW (ref 17.9–39.5)
TIBC: 323 ug/dL (ref 250–450)
UIBC: 292 ug/dL

## 2017-05-19 LAB — FERRITIN: FERRITIN: 134 ng/mL (ref 24–336)

## 2017-05-19 MED ORDER — ENOXAPARIN SODIUM 100 MG/ML ~~LOC~~ SOLN
100.0000 mg | Freq: Two times a day (BID) | SUBCUTANEOUS | 0 refills | Status: DC
Start: 2017-05-19 — End: 2017-06-30

## 2017-05-19 NOTE — Addendum Note (Signed)
Addended by: Vernetta Honey on: 05/19/2017 02:08 PM   Modules accepted: Orders

## 2017-05-19 NOTE — Progress Notes (Signed)
Patient here today for follow up.  Patient states no new concerns today  

## 2017-05-19 NOTE — Progress Notes (Addendum)
[facility] Follow up Visit:  Patient Care Team: Alvester Morin, MD as PCP - General (Family Medicine)  REASON FOR VISIT Follow up for treatment of Recurrent DVT and recommendation of perioperative anticoagulation instructions.  PERTINENT HEMATOLOGY HISTORY 1Robert L Warsame 66 y.o. male with PMH listed as below is here for evaluation of recurrent DVT and management. Patient is accompanied by RN from [facility] at [facility].  Patient is a poor historian. He remembers having leg clots for more than one time. He remembers taking Eliquis in the past and recently his anticoagulation has been switched to [place].  2 Medical records from nursing home by patient's PCP Dr.Slade-Hartman, patient had been on Eliquis 5mg  BID for previous DVTs. His medical records showed he had acute DVT at the left common femoral vein on 11/26/2016, and he was placed on Eliquis 2.5mg  BID. Later when another venous doppler was obtained on 02/15/2017 due to persistent pain and swelling, doppler showed persist deep vein thrombosis involving proxima to distal femoral vein. Common femoral vein has normal compression. There was a note on the second doppler report on 03/03/2017 that patient was taking Eliquis 5mg  BID, which was stopped on 7/19 and patient was placed on Lovenox. Patient had swelling and pain of his left stump and symptoms does not improve with being on Lovenox. Xarelto 15mg  BID was started on 03/05/2017 and Lovenox was discontinued on 03/09/2017 with a plan to switch to Xarelto 20mg  daily after 21 days.  It was mentioned in nursing home note that patient had been on Warfarin previously and acquired blood clot while on warfarin. Patient does not remember this and the details of his?warfarin resistance is unclear.  3 His Lupus anticoagulant testing was positive due to prolonged DRVVT, although possibly falsely positive due to [place]. He was offered to be switched to Lovenox shots  as alternative for treatment of possible lupus anticoagulant hypercoagulable state, patient prefers to stay on Xalreto 20mg  daily and has been doing well on that.  4 Iron deficiency anemia: Stool occult positive. Low ferritin. He is s/p IV venofer weekly x 4 doses.   Today patient who has above hematology history reviewed by me today presents to follow up on the iron deficiency treatment, as well as management of his chronic anticoagulation with Xarelto.  Chronic anticoagulation for recurrent DVT, stable,  he has been on Xarelto 20mg  daily tolerating well. No bleeding events.  He reports his left stump pain and swelling has dramatically improved, still have intermittent muscle spasm. He was evaluated by GI Dr.Anna, plan for EGD + colonoscopy next week. Patient.Needs perioperative anticoagulation instruction.   Iron deficiency anemia s/p IV iron infusion. Patient reports that his energy level has stayed the same after IV iron. He is not able to tell any difference.  Peripheral vascular disease: stable. Last seen by Dr. Dian Situ in July 2018.   Review of Systems  Constitutional: Negative.   HENT:  Negative.   Eyes: Negative.   Respiratory: Negative.   Cardiovascular: Negative.   Gastrointestinal: Negative.   Endocrine: Negative.   Genitourinary: Negative.    Musculoskeletal:       Left stump no swelling or pain except intermittent spasm.   Skin: Negative.   Neurological: Negative.   Hematological: Negative.   Psychiatric/Behavioral: Negative.     MEDICAL HISTORY: Past Medical History:  Diagnosis Date  . Allergy   . Anemia   . ARF (acute respiratory failure) (HCC)    H/O  . CHF (  congestive heart failure) ([place])   . COPD (chronic obstructive pulmonary disease) ([place])   . Hypertension   . Hypokalemia   . Insomnia   . Muscle contracture    MUSCLE SPASMS  . Peripheral vascular disease ([place])   . Pressure ulcer     SURGICAL HISTORY: Past Surgical History:  Procedure Laterality Date  .  AMPUTATION Left 09/19/2015   Procedure: AMPUTATION BELOW KNEE;  Surgeon: Algernon Huxley, MD;  Location: [facility] ORS;  Service: Vascular;  Laterality: Left;  . AMPUTATION Left 11/01/2015   Procedure: AMPUTATION ABOVE KNEE;  Surgeon: Algernon Huxley, MD;  Location: [facility] OR[facility];  Service: Vascular;  Laterality: Left;  . APPLICATION OF WOUND VAC Left 10/18/2015   Procedure: APPLICATION OF WOUND VAC;  Surgeon: Algernon Huxley, MD;  Location: [facility];  Service: Vascular;  Laterality: Left;  . PERIPHERAL VASCULAR CATHETERIZATION Left 01/18/2015   Procedure: Lower Extremity Angiography;  Surgeon: Algernon Huxley, MD;  Location: [place] [facility];  Service: Cardiovascular;  Laterality: Left;  . PERIPHERAL VASCULAR CATHETERIZATION N/A 01/18/2015   Procedure: Lower Extremity Intervention;  Surgeon: Algernon Huxley, MD;  Location: [place] [facility];  Service: Cardiovascular;  Laterality: N/A;  . PERIPHERAL VASCULAR CATHETERIZATION  07/30/2015   Procedure: Lower Extremity Intervention;  Surgeon: Algernon Huxley, MD;  Location: [place] [facility];  Service: Cardiovascular;;  . PERIPHERAL VASCULAR CATHETERIZATION N/A 07/30/2015   Procedure: Abdominal Aortogram w/Lower Extremity;  Surgeon: Algernon Huxley, MD;  Location: [place] [facility];  Service: Cardiovascular;  Laterality: N/A;  . PERIPHERAL VASCULAR CATHETERIZATION Left 08/22/2015   Procedure: Lower Extremity Angiography;  Surgeon: Algernon Huxley, MD;  Location: [place] [facility];  Service: Cardiovascular;  Laterality: Left;  . PERIPHERAL VASCULAR CATHETERIZATION Left 08/22/2015   Procedure: Lower Extremity Intervention;  Surgeon: Algernon Huxley, MD;  Location: [place] [facility];  Service: Cardiovascular;  Laterality: Left;  . PERIPHERAL VASCULAR CATHETERIZATION Right 03/31/2016   Procedure: Lower Extremity Angiography;  Surgeon: Algernon Huxley, MD;  Location: [place] [facility];  Service: Cardiovascular;  Laterality: Right;  . PERIPHERAL VASCULAR CATHETERIZATION  03/31/2016   Procedure:  Lower Extremity Intervention;  Surgeon: Algernon Huxley, MD;  Location: [place] [facility];  Service: Cardiovascular;;  . PERIPHERAL VASCULAR CATHETERIZATION Left 04/10/2016   Procedure: Lower Extremity Angiography;  Surgeon: Algernon Huxley, MD;  Location: [place] [facility];  Service: Cardiovascular;  Laterality: Left;  . WOUND DEBRIDEMENT Left 10/18/2015   Procedure: DEBRIDEMENT WOUND   ( LEFT BKA DEBRIDEMENT );  Surgeon: Algernon Huxley, MD;  Location: [facility];  Service: Vascular;  Laterality: Left;    SOCIAL HISTORY: Social History   Social History  . Marital status: Legally Separated    Spouse name: N/A  . Number of children: N/A  . Years of education: N/A   Occupational History  . Not on file.   Social History Main Topics  . Smoking status: Former Smoker    Packs/day: 1.00    Years: 44.00    Types: Cigarettes    Quit date: 11/17/2012  . Smokeless tobacco: Never Used  . Alcohol use No  . Drug use: No  . Sexual activity: Not on file   Other Topics Concern  . Not on file   Social History Narrative  . No narrative on file    FAMILY HISTORY: Mother died from an MI. Father died from a brain tumor  ALLERGIES:  has No Known Allergies.  MEDICATIONS:  Current Outpatient Prescriptions  Medication Sig Dispense Refill  . acetaminophen (TYLENOL) 325 MG tablet Take 650 mg by mouth at bedtime.     [name] aspirin EC 81 MG EC tablet Take 1 tablet (81 mg total) by mouth daily. 30 tablet 11  . baclofen (LIORESAL) 10 MG tablet Take 10 mg by mouth 3 (three) times daily. Hold for sedation    . budesonide-formoterol (SYMBICORT) 80-4.5 MCG/ACT inhaler Inhale 2 puffs into the lungs 2 (two) times daily.    . fentaNYL (DURAGESIC - DOSED MCG/HR) 12 MCG/HR Place 12 mcg onto the skin every 3 (three) days.     . furosemide (LASIX) 40 MG tablet Take 40 mg by mouth 2 (two) times daily.     [name] gabapentin (NEURONTIN) 100 MG capsule Take 100 mg by mouth at bedtime.    . Melatonin 5 MG CAPS Take 1 capsule by mouth  at bedtime as needed.    . metoprolol tartrate (LOPRESSOR) 25 MG tablet Take 0.5 tablets (12.5 mg total) by mouth 2 (two) times daily. 60 tablet 5  . Omega-3 Fatty Acids (FISH OIL) 1000 MG CAPS Take 1,000 mg by mouth every morning.    . Oxycodone HCl 10 MG TABS Take 10 mg by mouth 3 (three) times daily.    . potassium chloride (K-DUR) 10 MEQ tablet Take 20 mEq by mouth daily.     [name] tiotropium (SPIRIVA) 18 MCG inhalation capsule Place 18 mcg into inhaler and inhale daily.    Alveda Reasons 20 MG TABS tablet     . enoxaparin (LOVENOX) 100 MG/ML injection Inject 1 mL (100 mg total) into the skin every 12 (twelve) hours. 3 Syringe 0   No current facility-administered medications for this visit.     PHYSICAL EXAMINATION:  ECOG PERFORMANCE STATUS: 1 - Symptomatic but completely ambulatory  Vitals:   05/19/17 1016  BP: 126/62  Pulse: 60  Temp: (!) 97 F (36.1 C)    Filed Weights   Physical Exam GENERAL: No distress, well nourished.  SKIN:  No rashes or significant lesions  HEAD: Normocephalic, No masses, lesions, tenderness or abnormalities  EYES: Conjunctiva are pink, non icteric ENT: External ears normal ,lips , buccal mucosa, and tongue normal and mucous membranes are moist  LYMPH: No palpable cervical and axillary lymphadenopathy  LUNGS: Clear to auscultation, no crackles or wheezes HEART: Regular rate & rhythm, no murmurs, no gallops, S1 normal and S2 normal  ABDOMEN: Abdomen soft, non-tender, normal bowel sounds, I did not appreciate any  masses or organomegaly  MUSCULOSKELETAL: No CVA tenderness and no tenderness on percussion of the back or rib cage.  EXTREMITIES: left above knee amputation stump non tender or swelling. No edema at right lower extremity.  NEURO: Alert & oriented, no focal motor/sensory deficits.   LABORATORY DATA: I have personally reviewed the data as listed:  Appointment on 05/19/2017  Component Date Value Ref Range Status  . WBC 05/19/2017 12.3* 3.8 -  10.6 K/uL Final  . RBC 05/19/2017 4.47  4.40 - 5.90 MIL/uL Final  . Hemoglobin 05/19/2017 11.4* 13.0 - 18.0 g/dL Final  . HCT 05/19/2017 35.4* 40.0 - 52.0 % Final  . MCV 05/19/2017 79.4* 80.0 - 100.0 fL Final  . MCH 05/19/2017 25.5* 26.0 - 34.0 pg Final  . MCHC 05/19/2017 32.2  32.0 - 36.0 g/dL Final  . RDW 05/19/2017 17.4* 11.5 - 14.5 % Final  . Platelets 05/19/2017 323  150 - 440 K/uL Final  . Neutrophils Relative % 05/19/2017 75  %  Final  . Neutro Abs 05/19/2017 9.3* 1.4 - 6.5 K/uL Final  . Lymphocytes Relative 05/19/2017 16  % Final  . Lymphs Abs 05/19/2017 2.0  1.0 - 3.6 K/uL Final  . Monocytes Relative 05/19/2017 6  % Final  . Monocytes Absolute 05/19/2017 0.7  0.2 - 1.0 K/uL Final  . Eosinophils Relative 05/19/2017 2  % Final  . Eosinophils Absolute 05/19/2017 0.2  0 - 0.7 K/uL Final  . Basophils Relative 05/19/2017 1  % Final  . Basophils Absolute 05/19/2017 0.1  0 - 0.1 K/uL Final  Appointment on 04/21/2017  Component Date Value Ref Range Status  . WBC 04/21/2017 11.8* 3.8 - 10.6 K/uL Final  . RBC 04/21/2017 4.63  4.40 - 5.90 MIL/uL Final  . Hemoglobin 04/21/2017 11.8* 13.0 - 18.0 g/dL Final  . HCT 04/21/2017 35.9* 40.0 - 52.0 % Final  . MCV 04/21/2017 77.4* 80.0 - 100.0 fL Final  . MCH 04/21/2017 25.5* 26.0 - 34.0 pg Final  . MCHC 04/21/2017 33.0  32.0 - 36.0 g/dL Final  . RDW 04/21/2017 17.4* 11.5 - 14.5 % Final  . Platelets 04/21/2017 310  150 - 440 K/uL Final  . Neutrophils Relative % 04/21/2017 72  % Final  . Neutro Abs 04/21/2017 8.5* 1.4 - 6.5 K/uL Final  . Lymphocytes Relative 04/21/2017 18  % Final  . Lymphs Abs 04/21/2017 2.1  1.0 - 3.6 K/uL Final  . Monocytes Relative 04/21/2017 7  % Final  . Monocytes Absolute 04/21/2017 0.8  0.2 - 1.0 K/uL Final  . Eosinophils Relative 04/21/2017 2  % Final  . Eosinophils Absolute 04/21/2017 0.3  0 - 0.7 K/uL Final  . Basophils Relative 04/21/2017 1  % Final  . Basophils Absolute 04/21/2017 0.1  0 - 0.1 K/uL Final     RADIOGRAPHIC STUDIES: I have personally reviewed the radiological images as listed and agree with the findings in the report 04/03/2017 US venous doppler IMPRESSION: Left inguinal/femoral partially thrombosed pseudoaneurysm/hematoma measuring 15 x 5.3 x 6.8 cm.  Reviewed image records from [facility] home 11/26/2016 patient had a venous Doppler extremity left done which showed no occluding acute DVT at the left common femoral vein. 03/03/2017 patient had another venous Doppler extremity left which showed deep vein thromboses involving the proximal to distal femoral vein the common femoral artery now has normal compression.  04/05/2013 CT chest with contrast    Chronic pulmonary disease with areas of atelectasis and bronchiectasis. Continued significant opacification and bullous change in the left lung especially in the upper lobe and superior segment of the  left lower lobe with other patchy areas bilaterally. There may be mild interval improvement in the area of the lower lingula. Malignancy cannot be excluded in the abnormal areas of the lungs. No central pulmonary arterial filling defect is seen. The bolus is not optimal for evaluation of the lungs  otherwise.    ASSESSMENT/PLAN 1. Recurrent deep vein thrombosis (DVT) ([place])   2. Anticoagulation management encounter   3. Iron deficiency anemia due to chronic blood loss   4. Microcytic anemia   5. Positive occult stool blood test   . 1 Continue Xarelto for anticoagulation. (patient preference). Lupus anticoagulant can be false positive due to Xarelto.  2 For the upcoming colonoscopy/EGD +/- biopsy, as his recent DVT episode was about 3 months ago which put him in high risk thrombotic risk group.     Diagnostic EGD/Colonosocpy +/- biopsy considered to be low-moderate bleeding risk.  Recommend bridging with Lovenox.     Advise patient to stop Xarelto 2 days before scheduled procedure, and start Lovenox 1mg /kg  every 12 hours. Hold the evening dose of Lovenox the night before procedure. Xarelto 20mg  daily can be resumed postoperatively when hemostasis has achieved.    Above instruction explained to patient and nursing home staff who accompanied him today. Lovenox Rx (3 doses) was provided to them as well. A copy of today's note will be faxed to nursing home.  3, 4  Hemoglobin improved after IV iron. Iron panel reviewed. No need for additional IV iron for now.  5: he has appointment for EGD and colonoscopy.   All questions were answered. The patient knows to call the clinic with any problems, questions or concerns. A copy of the note will be routed to GI Dr.Anna. See me in 4 weeks.    Earlie Server, MD  05/19/2017 11:59 AM

## 2017-05-21 ENCOUNTER — Ambulatory Visit: Payer: Medicare Other | Admitting: Oncology

## 2017-05-25 ENCOUNTER — Encounter
Admission: RE | Disposition: A | Discharge status: Home / Self Care | Payer: Self-pay | Source: Ambulatory Visit | Attending: Gastroenterology

## 2017-05-25 ENCOUNTER — Ambulatory Visit
Admission: RE | Admit: 2017-05-25 | Discharge: 2017-05-25 | Disposition: A | Discharge status: Home / Self Care | Payer: Medicare Other | Source: Ambulatory Visit | Attending: Gastroenterology | Admitting: Gastroenterology

## 2017-05-25 ENCOUNTER — Encounter: Payer: Self-pay | Admitting: *Deleted

## 2017-05-25 ENCOUNTER — Ambulatory Visit: Payer: Medicare Other | Admitting: Anesthesiology

## 2017-05-25 DIAGNOSIS — K449 Diaphragmatic hernia without obstruction or gangrene: Secondary | ICD-10-CM | POA: Diagnosis not present

## 2017-05-25 DIAGNOSIS — Z89512 Acquired absence of left leg below knee: Secondary | ICD-10-CM | POA: Insufficient documentation

## 2017-05-25 DIAGNOSIS — I739 Peripheral vascular disease, unspecified: Secondary | ICD-10-CM | POA: Diagnosis not present

## 2017-05-25 DIAGNOSIS — Z79891 Long term (current) use of opiate analgesic: Secondary | ICD-10-CM | POA: Diagnosis not present

## 2017-05-25 DIAGNOSIS — D124 Benign neoplasm of descending colon: Secondary | ICD-10-CM | POA: Diagnosis not present

## 2017-05-25 DIAGNOSIS — G4700 Insomnia, unspecified: Secondary | ICD-10-CM | POA: Insufficient documentation

## 2017-05-25 DIAGNOSIS — J449 Chronic obstructive pulmonary disease, unspecified: Secondary | ICD-10-CM | POA: Diagnosis not present

## 2017-05-25 DIAGNOSIS — Z7982 Long term (current) use of aspirin: Secondary | ICD-10-CM | POA: Diagnosis not present

## 2017-05-25 DIAGNOSIS — I110 Hypertensive heart disease with heart failure: Secondary | ICD-10-CM | POA: Insufficient documentation

## 2017-05-25 DIAGNOSIS — B3781 Candidal esophagitis: Secondary | ICD-10-CM | POA: Diagnosis not present

## 2017-05-25 DIAGNOSIS — Z89611 Acquired absence of right leg above knee: Secondary | ICD-10-CM | POA: Insufficient documentation

## 2017-05-25 DIAGNOSIS — D126 Benign neoplasm of colon, unspecified: Secondary | ICD-10-CM | POA: Diagnosis not present

## 2017-05-25 DIAGNOSIS — Z87891 Personal history of nicotine dependence: Secondary | ICD-10-CM | POA: Insufficient documentation

## 2017-05-25 DIAGNOSIS — D122 Benign neoplasm of ascending colon: Secondary | ICD-10-CM | POA: Diagnosis not present

## 2017-05-25 DIAGNOSIS — D509 Iron deficiency anemia, unspecified: Secondary | ICD-10-CM | POA: Insufficient documentation

## 2017-05-25 DIAGNOSIS — D123 Benign neoplasm of transverse colon: Secondary | ICD-10-CM

## 2017-05-25 DIAGNOSIS — K2900 Acute gastritis without bleeding: Secondary | ICD-10-CM | POA: Insufficient documentation

## 2017-05-25 DIAGNOSIS — D649 Anemia, unspecified: Secondary | ICD-10-CM | POA: Diagnosis not present

## 2017-05-25 DIAGNOSIS — K635 Polyp of colon: Secondary | ICD-10-CM | POA: Diagnosis not present

## 2017-05-25 DIAGNOSIS — I509 Heart failure, unspecified: Secondary | ICD-10-CM | POA: Diagnosis not present

## 2017-05-25 DIAGNOSIS — Z79899 Other long term (current) drug therapy: Secondary | ICD-10-CM | POA: Insufficient documentation

## 2017-05-25 HISTORY — PX: ESOPHAGOGASTRODUODENOSCOPY (EGD) WITH PROPOFOL: SHX5813

## 2017-05-25 HISTORY — PX: COLONOSCOPY WITH PROPOFOL: SHX5780

## 2017-05-25 LAB — KOH PREP

## 2017-05-25 SURGERY — COLONOSCOPY WITH PROPOFOL
Anesthesia: General

## 2017-05-25 MED ORDER — PROPOFOL 10 MG/ML IV BOLUS
INTRAVENOUS | Status: DC | PRN
  Administered 2017-05-25: 60 mg via INTRAVENOUS

## 2017-05-25 MED ORDER — LIDOCAINE HCL (CARDIAC) 20 MG/ML IV SOLN
INTRAVENOUS | Status: DC | PRN
  Administered 2017-05-25: 40 mg via INTRAVENOUS

## 2017-05-25 MED ORDER — MIDAZOLAM HCL 2 MG/2ML IJ SOLN
INTRAMUSCULAR | Status: DC | PRN
  Administered 2017-05-25: 2 mg via INTRAVENOUS

## 2017-05-25 MED ORDER — PROPOFOL 500 MG/50ML IV EMUL
INTRAVENOUS | Status: AC
  Filled 2017-05-25: qty 50

## 2017-05-25 MED ORDER — MIDAZOLAM HCL 2 MG/2ML IJ SOLN
INTRAMUSCULAR | Status: AC
Start: 2017-05-25 — End: 2017-05-25
  Filled 2017-05-25: qty 2

## 2017-05-25 MED ORDER — PHENYLEPHRINE HCL 10 MG/ML IJ SOLN
INTRAMUSCULAR | Status: DC | PRN
  Administered 2017-05-25 (×3): 100 ug via INTRAVENOUS

## 2017-05-25 MED ORDER — GLYCOPYRROLATE 0.2 MG/ML IJ SOLN
INTRAMUSCULAR | Status: DC | PRN
  Administered 2017-05-25: 0.2 mg via INTRAVENOUS

## 2017-05-25 MED ORDER — GLYCOPYRROLATE 0.2 MG/ML IJ SOLN
INTRAMUSCULAR | Status: AC
  Filled 2017-05-25: qty 1

## 2017-05-25 MED ORDER — SODIUM CHLORIDE 0.9 % IV SOLN
INTRAVENOUS | Status: DC
  Administered 2017-05-25: 1000 mL via INTRAVENOUS
  Administered 2017-05-25: 11:00:00 via INTRAVENOUS

## 2017-05-25 MED ORDER — PROPOFOL 10 MG/ML IV BOLUS
INTRAVENOUS | Status: AC
Start: 2017-05-25 — End: 2017-05-25
  Filled 2017-05-25: qty 20

## 2017-05-25 MED ORDER — PROPOFOL 500 MG/50ML IV EMUL
INTRAVENOUS | Status: DC | PRN
  Administered 2017-05-25: 150 ug/kg/min via INTRAVENOUS

## 2017-05-25 NOTE — Anesthesia Post-op Follow-up Note (Signed)
Anesthesia QCDR form completed.        

## 2017-05-25 NOTE — Op Note (Signed)
[facility] Gastroenterology Patient Name: Timothy Juarez Procedure Date: 05/25/2017 10:52 AM MRN: [id number] Account #: [id number] Date of Birth: [birth year] Admit Type: Outpatient Age: 66 Room: [facility] ENDO ROOM 4 Gender: Male Note Status: Finalized Procedure:            Colonoscopy Indications:          Iron deficiency anemia Providers:            Jonathon Bellows MD, MD Medicines:            Monitored Anesthesia Care Complications:        No immediate complications. Procedure:            Pre-Anesthesia Assessment:                       - Prior to the procedure, a History and Physical was                        performed, and patient medications, allergies and                        sensitivities were reviewed. The patient's tolerance of                        previous anesthesia was reviewed.                       - The risks and benefits of the procedure and the                        sedation options and risks were discussed with the                        patient. All questions were answered and informed                        consent was obtained.                       - ASA Grade Assessment: III - A patient with severe                        systemic disease.                       After obtaining informed consent, the colonoscope was                        passed under direct vision. Throughout the procedure,                        the patient's blood pressure, pulse, and oxygen                        saturations were monitored continuously. The                        Colonoscope was introduced through the anus and                        advanced to the the cecum, identified by the  appendiceal orifice, IC valve and transillumination.                        The colonoscopy was performed with ease. The patient                        tolerated the procedure well. The quality of the bowel                        preparation was  good. Findings:      The perianal and digital rectal examinations were normal.      A 20 mm polyp was found in the ascending colon. The polyp was sessile.       Preparations were made for mucosal resection. Eleview 10 cc was injected       to raise the lesion. Snare mucosal resection was performed. Resection       and retrieval were complete. To close a defect after mucosal resection,       three hemostatic clips were successfully placed. There was no bleeding       during, or at the end, of the procedure.      A 10 mm polyp was found in the descending colon. The polyp was       semi-sessile. The polyp was removed with a hot snare. Resection and       retrieval were complete. To prevent bleeding after the polypectomy, one       hemostatic clip was successfully placed. There was no bleeding during,       or at the end, of the procedure.      A 8 mm polyp was found in the transverse colon. The polyp was sessile.       The polyp was removed with a hot snare. Resection and retrieval were       complete. To prevent bleeding after the polypectomy, one hemostatic clip       was successfully placed. There was no bleeding during, or at the end, of       the procedure.      Two sessile polyps were found in the transverse colon. The polyps were 5       to 8 mm in size. These polyps were removed with a cold snare. Resection       and retrieval were complete. To prevent bleeding after the polypectomy,       one hemostatic clip was successfully placed. There was no bleeding       during, or at the end, of the procedure.      The exam was otherwise without abnormality on direct and retroflexion       views. Impression:           - One 20 mm polyp in the ascending colon, removed with                        mucosal resection. Resected and retrieved. Clips were                        placed.                       - One 10 mm polyp in the descending colon, removed with  a hot snare.  Resected and retrieved. Clip was placed.                       - One 8 mm polyp in the transverse colon, removed with                        a hot snare. Resected and retrieved. Clip was placed.                       - Two 5 to 8 mm polyps in the transverse colon, removed                        with a cold snare. Resected and retrieved. Clip was                        placed.                       - The examination was otherwise normal on direct and                        retroflexion views.                       - Mucosal resection was performed. Resection and                        retrieval were complete. Recommendation:       - Discharge patient to home (with escort).                       - Resume previous diet.                       - Continue present medications.                       - Await pathology results.                       - Repeat colonoscopy in 6 months for surveillance after                        piecemeal polypectomy.                       - Return to GI office in 4 weeks. Procedure Code(s):    --- Professional ---                       [phone], 54, Colonoscopy, flexible; with endoscopic                        mucosal resection                       [phone], Colonoscopy, flexible; with removal of tumor(s),                        polyp(s), or other lesion(s) by snare technique Diagnosis Code(s):    --- Professional ---  D12.2, Benign neoplasm of ascending colon                       D12.4, Benign neoplasm of descending colon                       D12.3, Benign neoplasm of transverse colon (hepatic                        flexure or splenic flexure)                       D50.9, Iron deficiency anemia, unspecified CPT copyright 2016 American Medical Association. All rights reserved. The codes documented in this report are preliminary and upon coder review may  be revised to meet current compliance requirements. Jonathon Bellows, MD Jonathon Bellows MD,  MD 05/25/2017 11:55:06 AM This report has been signed electronically. Number of Addenda: 0 Note Initiated On: 05/25/2017 10:52 AM Scope Withdrawal Time: 0 hours 27 minutes 5 seconds  Total Procedure Duration: 0 hours 34 minutes 15 seconds       [facility]

## 2017-05-25 NOTE — Transfer of Care (Signed)
Immediate Anesthesia Transfer of Care Note  Patient: Timothy Juarez  Procedure(s) Performed: COLONOSCOPY WITH PROPOFOL (N/A ) ESOPHAGOGASTRODUODENOSCOPY (EGD) WITH PROPOFOL (N/A )  Patient Location: PACU  Anesthesia Type:General  Level of Consciousness: sedated  Airway & Oxygen Therapy: Patient Spontanous Breathing and Patient connected to nasal cannula oxygen  Post-op Assessment: Report given to RN and Post -op Vital signs reviewed and stable  Post vital signs: Reviewed and stable  Last Vitals:  Vitals:   05/25/17 0959  BP: (!) 133/54  Pulse: (!) 49  Resp: 18  Temp: (!) 36.2 C  SpO2: 98%    Last Pain:  Vitals:   05/25/17 0959  TempSrc: Tympanic         Complications: No apparent anesthesia complications

## 2017-05-25 NOTE — Op Note (Signed)
[facility] Gastroenterology Patient Name: Timothy Juarez Procedure Date: 05/25/2017 10:55 AM MRN: [id number] Account #: [id number] Date of Birth: [birth year] Admit Type: Outpatient Age: 66 Room: [facility] ENDO ROOM 4 Gender: Male Note Status: Finalized Procedure:            Upper GI endoscopy Indications:          Iron deficiency anemia Providers:            Jonathon Bellows MD, MD Medicines:            Monitored Anesthesia Care Complications:        No immediate complications. Procedure:            Pre-Anesthesia Assessment:                       - Prior to the procedure, a History and Physical was                        performed, and patient medications, allergies and                        sensitivities were reviewed. The patient's tolerance of                        previous anesthesia was reviewed.                       - The risks and benefits of the procedure and the                        sedation options and risks were discussed with the                        patient. All questions were answered and informed                        consent was obtained.                       - ASA Grade Assessment: III - A patient with severe                        systemic disease.                       After obtaining informed consent, the endoscope was                        passed under direct vision. Throughout the procedure,                        the patient's blood pressure, pulse, and oxygen                        saturations were monitored continuously. The                        Colonoscope was introduced through the mouth, and                        advanced to the third part of duodenum. The upper GI  endoscopy was accomplished with ease. The patient                        tolerated the procedure well. Findings:      The examined duodenum was normal.      The examined duodenum was normal. Biopsies for histology were taken with       a cold  forceps for evaluation of celiac disease.      A small hiatal hernia was present.      Patchy moderate inflammation characterized by congestion (edema) and       erythema was found in the gastric antrum. Biopsies were taken with a       cold forceps for histology.      Diffuse candidiasis was found in the entire esophagus. Brushings were       obtained in the entire esophagus. Impression:           - Normal examined duodenum.                       - Normal examined duodenum. Biopsied.                       - Small hiatal hernia.                       - Gastritis. Biopsied.                       - Monilial esophagitis. Brushings performed. Recommendation:       - Await pathology results.                       - Perform a colonoscopy today. Procedure Code(s):    --- Professional ---                       [id number], Esophagogastroduodenoscopy, flexible, transoral;                        with biopsy, single or multiple Diagnosis Code(s):    --- Professional ---                       K44.9, Diaphragmatic hernia without obstruction or                        gangrene                       K29.70, Gastritis, unspecified, without bleeding                       B37.81, Candidal esophagitis                       D50.9, Iron deficiency anemia, unspecified CPT copyright 2016 American Medical Association. All rights reserved. The codes documented in this report are preliminary and upon coder review may  be revised to meet current compliance requirements. Jonathon Bellows, MD Jonathon Bellows MD, MD 05/25/2017 11:11:04 AM This report has been signed electronically. Number of Addenda: 0 Note Initiated On: 05/25/2017 10:55 AM      [facility]

## 2017-05-25 NOTE — Anesthesia Postprocedure Evaluation (Signed)
Anesthesia Post Note  Patient: Timothy Juarez  Procedure(s) Performed: COLONOSCOPY WITH PROPOFOL (N/A ) ESOPHAGOGASTRODUODENOSCOPY (EGD) WITH PROPOFOL (N/A )  Patient location during evaluation: Endoscopy Anesthesia Type: General Level of consciousness: awake and alert Pain management: pain level controlled Vital Signs Assessment: post-procedure vital signs reviewed and stable Respiratory status: spontaneous breathing, nonlabored ventilation, respiratory function stable and patient connected to nasal cannula oxygen Cardiovascular status: blood pressure returned to baseline and stable Postop Assessment: no apparent nausea or vomiting Anesthetic complications: no     Last Vitals:  Vitals:   05/25/17 1216 05/25/17 1226  BP: 134/79 132/79  Pulse: 72 78  Resp: 16 17  Temp:    SpO2: 99% 100%    Last Pain:  Vitals:   05/25/17 1206  TempSrc:   PainSc: Toast S

## 2017-05-25 NOTE — Anesthesia Preprocedure Evaluation (Signed)
Anesthesia Evaluation  Patient identified by MRN, date of birth, ID band Patient awake    Reviewed: Allergy & Precautions, NPO status , Patient's Chart, lab work & pertinent test results, reviewed documented beta blocker date and time   Airway Mallampati: III  TM Distance: >3 FB     Dental  (+) Chipped   Pulmonary COPD, former smoker,           Cardiovascular hypertension, Pt. on medications and Pt. on home beta blockers + Peripheral Vascular Disease and +CHF       Neuro/Psych  Neuromuscular disease    GI/Hepatic   Endo/Other    Renal/GU      Musculoskeletal   Abdominal   Peds  Hematology  (+) anemia ,   Anesthesia Other Findings duragesic.  Reproductive/Obstetrics                             Anesthesia Physical Anesthesia Plan  ASA: III  Anesthesia Plan: General   Post-op Pain Management:    Induction: Intravenous  PONV Risk Score and Plan:   Airway Management Planned:   Additional Equipment:   Intra-op Plan:   Post-operative Plan:   Informed Consent: I have reviewed the patients History and Physical, chart, labs and discussed the procedure including the risks, benefits and alternatives for the proposed anesthesia with the patient or authorized representative who has indicated his/her understanding and acceptance.     Plan Discussed with: CRNA  Anesthesia Plan Comments:         Anesthesia Quick Evaluation

## 2017-05-25 NOTE — H&P (Signed)
Jonathon Bellows MD [address] [place], [place] [address] Phone: [phone] Fax : [phone]  Primary Care Physician:  Alvester Morin, MD Primary Gastroenterologist:  Dr. Jonathon Bellows   Pre-Procedure History & Physical: HPI:  ROOSVELT CHURCHWELL is a 66 y.o. male is here for an endoscopy and colonoscopy.   Past Medical History:  Diagnosis Date  . Allergy   . Anemia   . ARF (acute respiratory failure) (HCC)    H/O  . CHF (congestive heart failure) ([place])   . COPD (chronic obstructive pulmonary disease) ([place])   . Hypertension   . Hypokalemia   . Insomnia   . Muscle contracture    MUSCLE SPASMS  . Peripheral vascular disease ([place])   . Pressure ulcer     Past Surgical History:  Procedure Laterality Date  . AMPUTATION Left 09/19/2015   Procedure: AMPUTATION BELOW KNEE;  Surgeon: Algernon Huxley, MD;  Location: [facility] ORS;  Service: Vascular;  Laterality: Left;  . AMPUTATION Left 11/01/2015   Procedure: AMPUTATION ABOVE KNEE;  Surgeon: Algernon Huxley, MD;  Location: [facility] ORS;  Service: Vascular;  Laterality: Left;  . APPLICATION OF WOUND VAC Left 10/18/2015   Procedure: APPLICATION OF WOUND VAC;  Surgeon: Algernon Huxley, MD;  Location: [facility] OR[facility];  Service: Vascular;  Laterality: Left;  . PERIPHERAL VASCULAR CATHETERIZATION Left 01/18/2015   Procedure: Lower Extremity Angiography;  Surgeon: Algernon Huxley, MD;  Location: [place] [facility];  Service: Cardiovascular;  Laterality: Left;  . PERIPHERAL VASCULAR CATHETERIZATION N/A 01/18/2015   Procedure: Lower Extremity Intervention;  Surgeon: Algernon Huxley, MD;  Location: [place] [facility];  Service: Cardiovascular;  Laterality: N/A;  . PERIPHERAL VASCULAR CATHETERIZATION  07/30/2015   Procedure: Lower Extremity Intervention;  Surgeon: Algernon Huxley, MD;  Location: [place] [facility];  Service: Cardiovascular;;  . PERIPHERAL VASCULAR CATHETERIZATION N/A 07/30/2015   Procedure: Abdominal Aortogram w/Lower Extremity;  Surgeon: Algernon Huxley,  MD;  Location: [place] [facility];  Service: Cardiovascular;  Laterality: N/A;  . PERIPHERAL VASCULAR CATHETERIZATION Left 08/22/2015   Procedure: Lower Extremity Angiography;  Surgeon: Algernon Huxley, MD;  Location: [place] [facility];  Service: Cardiovascular;  Laterality: Left;  . PERIPHERAL VASCULAR CATHETERIZATION Left 08/22/2015   Procedure: Lower Extremity Intervention;  Surgeon: Algernon Huxley, MD;  Location: [place] [facility];  Service: Cardiovascular;  Laterality: Left;  . PERIPHERAL VASCULAR CATHETERIZATION Right 03/31/2016   Procedure: Lower Extremity Angiography;  Surgeon: Algernon Huxley, MD;  Location: [place] [facility];  Service: Cardiovascular;  Laterality: Right;  . PERIPHERAL VASCULAR CATHETERIZATION  03/31/2016   Procedure: Lower Extremity Intervention;  Surgeon: Algernon Huxley, MD;  Location: [place] [facility];  Service: Cardiovascular;;  . PERIPHERAL VASCULAR CATHETERIZATION Left 04/10/2016   Procedure: Lower Extremity Angiography;  Surgeon: Algernon Huxley, MD;  Location: [place] [facility];  Service: Cardiovascular;  Laterality: Left;  . WOUND DEBRIDEMENT Left 10/18/2015   Procedure: DEBRIDEMENT WOUND   ( LEFT BKA DEBRIDEMENT );  Surgeon: Algernon Huxley, MD;  Location: [facility] OR[facility];  Service: Vascular;  Laterality: Left;    Prior to Admission medications   Medication Sig Start Date End Date Taking? Authorizing Provider  acetaminophen (TYLENOL) 325 MG tablet Take 650 mg by mouth at bedtime.    Yes [provider]  aspirin EC 81 MG EC tablet Take 1 tablet (81 mg total) by mouth daily. 09/24/15  Yes Algernon Huxley, MD  budesonide-formoterol (SYMBICORT) 80-4.5 MCG/ACT  inhaler Inhale 2 puffs into the lungs 2 (two) times daily.   Yes [provider]  enoxaparin (LOVENOX) 100 MG/ML injection Inject 100 mg into the skin.   Yes [provider]  fentaNYL (DURAGESIC - DOSED MCG/HR) 12 MCG/HR Place 12 mcg onto the skin every 3 (three) days.  10/28/16  Yes [provider]   furosemide (LASIX) 40 MG tablet Take 40 mg by mouth 2 (two) times daily.    Yes [provider]  gabapentin (NEURONTIN) 100 MG capsule Take 100 mg by mouth at bedtime.   Yes [provider]  metoprolol tartrate (LOPRESSOR) 25 MG tablet Take 0.5 tablets (12.5 mg total) by mouth 2 (two) times daily. 09/24/15  Yes Dew, Erskine Squibb, MD  Oxycodone HCl 10 MG TABS Take 10 mg by mouth 3 (three) times daily.   Yes [provider]  potassium chloride (K-DUR) 10 MEQ tablet Take 20 mEq by mouth daily.    Yes [provider]  tiotropium (SPIRIVA) 18 MCG inhalation capsule Place 18 mcg into inhaler and inhale daily.   Yes [provider]  baclofen (LIORESAL) 10 MG tablet Take 10 mg by mouth 3 (three) times daily. Hold for sedation    [provider]  enoxaparin (LOVENOX) 100 MG/ML injection Inject 1 mL (100 mg total) into the skin every 12 (twelve) hours. 05/19/17 05/21/17  Earlie Server, MD  Melatonin 5 MG CAPS Take 1 capsule by mouth at bedtime as needed.    [provider]  Omega-3 Fatty Acids (FISH OIL) 1000 MG CAPS Take 1,000 mg by mouth every morning.    [provider]  XARELTO 20 MG TABS tablet  04/16/17   [provider]    Allergies as of 05/12/2017  . (No Known Allergies)    History reviewed. No pertinent family history.  Social History   Social History  . Marital status: Legally Separated    Spouse name: N/A  . Number of children: N/A  . Years of education: N/A   Occupational History  . Not on file.   Social History Main Topics  . Smoking status: Former Smoker    Packs/day: 1.00    Years: 44.00    Types: Cigarettes    Quit date: 11/17/2012  . Smokeless tobacco: Never Used  . Alcohol use No  . Drug use: No  . Sexual activity: Not on file   Other Topics Concern  . Not on file   Social History Narrative  . No narrative on file    Review of Systems: See HPI, otherwise negative ROS  Physical Exam: There  were no vitals taken for this visit. General:   Alert,  pleasant and cooperative in NAD Head:  Normocephalic and atraumatic. Neck:  Supple; no masses or thyromegaly. Lungs:  Clear throughout to auscultation.    Heart:  Regular rate and rhythm. Abdomen:  Soft, nontender and nondistended. Normal bowel sounds, without guarding, and without rebound.   Neurologic:  Alert and  oriented x4;  grossly normal neurologically.  Impression/Plan: Mikel Cella is here for an endoscopy and colonoscopy to be performed for iron deficiency anemia   Risks, benefits, limitations, and alternatives regarding  endoscopy and colonoscopy have been reviewed with the patient.  Questions have been answered.  All parties agreeable.   Jonathon Bellows, MD  05/25/2017, 10:18 AM

## 2017-05-26 ENCOUNTER — Encounter: Payer: Self-pay | Admitting: Gastroenterology

## 2017-05-26 LAB — SURGICAL PATHOLOGY

## 2017-06-02 DIAGNOSIS — Z23 Encounter for immunization: Secondary | ICD-10-CM | POA: Diagnosis not present

## 2017-06-18 ENCOUNTER — Inpatient Hospital Stay: Payer: Medicare Other | Attending: Oncology

## 2017-06-18 ENCOUNTER — Other Ambulatory Visit (INDEPENDENT_AMBULATORY_CARE_PROVIDER_SITE_OTHER): Payer: Self-pay | Admitting: Vascular Surgery

## 2017-06-18 ENCOUNTER — Inpatient Hospital Stay: Payer: Medicare Other | Admitting: Oncology

## 2017-06-18 DIAGNOSIS — I7090 Unspecified atherosclerosis: Secondary | ICD-10-CM

## 2017-06-18 DIAGNOSIS — I739 Peripheral vascular disease, unspecified: Secondary | ICD-10-CM

## 2017-06-22 ENCOUNTER — Encounter (INDEPENDENT_AMBULATORY_CARE_PROVIDER_SITE_OTHER): Payer: Self-pay | Admitting: Vascular Surgery

## 2017-06-22 ENCOUNTER — Ambulatory Visit (INDEPENDENT_AMBULATORY_CARE_PROVIDER_SITE_OTHER): Payer: Medicare Other

## 2017-06-22 ENCOUNTER — Ambulatory Visit (INDEPENDENT_AMBULATORY_CARE_PROVIDER_SITE_OTHER): Payer: Medicare Other | Admitting: Vascular Surgery

## 2017-06-22 VITALS — BP 128/64 | HR 60 | Resp 16 | Ht 71.0 in | Wt 205.0 lb

## 2017-06-22 DIAGNOSIS — I739 Peripheral vascular disease, unspecified: Secondary | ICD-10-CM

## 2017-06-22 DIAGNOSIS — I7090 Unspecified atherosclerosis: Secondary | ICD-10-CM

## 2017-06-22 DIAGNOSIS — Z89512 Acquired absence of left leg below knee: Secondary | ICD-10-CM | POA: Diagnosis not present

## 2017-06-22 DIAGNOSIS — I70235 Atherosclerosis of native arteries of right leg with ulceration of other part of foot: Secondary | ICD-10-CM | POA: Diagnosis not present

## 2017-06-22 DIAGNOSIS — I82409 Acute embolism and thrombosis of unspecified deep veins of unspecified lower extremity: Secondary | ICD-10-CM | POA: Diagnosis not present

## 2017-06-22 NOTE — Progress Notes (Signed)
Subjective:    Patient ID: Timothy Juarez, male    DOB: [birth year], 66 y.o.   MRN: [id number] Chief Complaint  Patient presents with  . Follow-up    6 month follow up   Patient presents for a 6 month peripheral artery disease follow-up. The patient presents with an aide from his residence. The patient presents today without complaint. The patient denies any claudication-like symptoms to the right extremity, rest pain or ulceration to the lower extremity. The patient has been seen by hematology for recurrent DVT to the left stump. Patient was placed on Xarelto. The patient underwent a right lower extremity ABI which was notable for monophasic tibials. Ankle-brachial index of 0.62 / last ABI on November 04, 2016 right ABI was 0.61. Patient underwent a right lower extremity arterial duplex which was notable for monophasic blood flow distally. No flow through the posterior  When compared to the previous exam on 11/04/2016 there is no significant change. The patient denies any fever, nausea or vomiting.Patient denies any issues with his stump.   Review of Systems  Constitutional: Negative.   HENT: Negative.   Eyes: Negative.   Respiratory: Negative.   Cardiovascular: Negative.   Gastrointestinal: Negative.   Endocrine: Negative.   Genitourinary: Negative.   Musculoskeletal: Negative.   Skin: Negative.   Allergic/Immunologic: Negative.   Neurological: Negative.   Hematological: Negative.   Psychiatric/Behavioral: Negative.       Objective:   Physical Exam  Constitutional: He is oriented to person, place, and time. He appears well-developed and well-nourished. No distress.  HENT:  Head: Normocephalic and atraumatic.  Eyes: Conjunctivae are normal. Pupils are equal, round, and reactive to light.  Neck: Normal range of motion.  Cardiovascular: Normal rate, regular rhythm, normal heart sounds and intact distal pulses.  Pulses:      Radial pulses are 2+ on the right side, and 2+ on the  left side.  Hard to palpate pedal pulses to the right lower extremity.  Pulmonary/Chest: Effort normal.  Musculoskeletal: Normal range of motion. He exhibits no edema.  Neurological: He is alert and oriented to person, place, and time.  Skin: He is not diaphoretic.  Right lower extremity: skin is intact. There are no ulcerations. Left stump: skin is intact. There are no ulcerations.  Psychiatric: He has a normal mood and affect. His behavior is normal. Judgment and thought content normal.  Vitals reviewed.  BP 128/64 (BP Location: Left Arm, Patient Position: Sitting)   Pulse 60   Resp 16   Ht 5\' 11"  (1.803 m)   Wt 205 lb (93 kg)   BMI 28.59 kg/m   Past Medical History:  Diagnosis Date  . Allergy   . Anemia   . ARF (acute respiratory failure) (HCC)    H/O  . CHF (congestive heart failure) ([place])   . COPD (chronic obstructive pulmonary disease) ([place])   . Hypertension   . Hypokalemia   . Insomnia   . Muscle contracture    MUSCLE SPASMS  . Peripheral vascular disease ([place])   . Pressure ulcer    Social History   Socioeconomic History  . Marital status: Legally Separated    Spouse name: Not on file  . Number of children: Not on file  . Years of education: Not on file  . Highest education level: Not on file  Social Needs  . Financial resource strain: Not on file  . Food insecurity - worry: Not on file  . Food insecurity - inability: Not  on file  . Transportation needs - medical: Not on file  . Transportation needs - non-medical: Not on file  Occupational History  . Not on file  Tobacco Use  . Smoking status: Former Smoker    Packs/day: 1.00    Years: 44.00    Pack years: 44.00    Types: Cigarettes    Last attempt to quit: 11/17/2012    Years since quitting: 4.5  . Smokeless tobacco: Never Used  Substance and Sexual Activity  . Alcohol use: No  . Drug use: No  . Sexual activity: Not on file  Other Topics Concern  . Not on file  Social History Narrative  . Not  on file   No past surgical history on file.  No family history on file.  No Known Allergies     Assessment & Plan:  Patient presents for a 6 month peripheral artery disease follow-up. The patient presents with an aide from his residence. The patient presents today without complaint. The patient denies any claudication-like symptoms to the right extremity, rest pain or ulceration to the lower extremity. The patient has been seen by hematology for recurrent DVT to the left stump. Patient was placed on Xarelto. The patient underwent a right lower extremity ABI which was notable for monophasic tibials. Ankle-brachial index of 0.62 / last ABI on November 04, 2016 right ABI was 0.61. Patient underwent a right lower extremity arterial duplex which was notable for monophasic blood flow distally. No flow through the posterior  When compared to the previous exam on 11/04/2016 there is no significant change. The patient denies any fever, nausea or vomiting.Patient denies any issues with his stump.  1. PAD (peripheral artery disease) ([place]) - Stable Patient presents today without complaint Patient with severe peripheral  Artery disease to the right lower extremity however his ABI and right lower extremity duplex are stable when compared to the previous in March 2018. Patient denies experiencing any claudication or rest pain. I examined his leg and stump and there are no ulcerations. Patient to follow up in 6 months for close observation of peripheral artery disease. The patient is to call before that appointment if he should experience claudication-like symptoms, rest pain or new ulceration to the lower extremity. The patient expresses his understanding  - VAS [place] ABI WITH/WO TBI; Future - VAS [place] LOWER EXTREMITY ARTERIAL DUPLEX; Future  2. Recurrent deep vein thrombosis (DVT) (HCC) - Stable Patient with recurrent DVT to the left stump. Patient has been seen by hematology and placed on xarelto This seems to  be managed by the nursing home MD  3. Status post below knee amputation of left lower extremity (HCC) - Stable His stump is healing well.  Current Outpatient Medications on File Prior to Visit  Medication Sig Dispense Refill  . acetaminophen (TYLENOL) 325 MG tablet Take 650 mg by mouth at bedtime.     [name] allopurinol (ZYLOPRIM) 100 MG tablet     . aspirin EC 81 MG EC tablet Take 1 tablet (81 mg total) by mouth daily. 30 tablet 11  . baclofen (LIORESAL) 10 MG tablet Take 10 mg by mouth 3 (three) times daily. Hold for sedation    . budesonide-formoterol (SYMBICORT) 80-4.5 MCG/ACT inhaler Inhale 2 puffs into the lungs 2 (two) times daily.    [name] enoxaparin (LOVENOX) 100 MG/ML injection Inject 1 mL (100 mg total) into the skin every 12 (twelve) hours. 3 Syringe 0  . enoxaparin (LOVENOX) 100 MG/ML injection Inject 100 mg into  the skin.    . fentaNYL (DURAGESIC - DOSED MCG/HR) 12 MCG/HR Place 12 mcg onto the skin every 3 (three) days.     . furosemide (LASIX) 40 MG tablet Take 40 mg by mouth 2 (two) times daily.     [name] gabapentin (NEURONTIN) 100 MG capsule Take 100 mg by mouth at bedtime.    . Melatonin 5 MG CAPS Take 1 capsule by mouth at bedtime as needed.    . metoprolol tartrate (LOPRESSOR) 25 MG tablet Take 0.5 tablets (12.5 mg total) by mouth 2 (two) times daily. 60 tablet 5  . Omega-3 Fatty Acids (FISH OIL) 1000 MG CAPS Take 1,000 mg by mouth every morning.    . Oxycodone HCl 10 MG TABS Take 10 mg by mouth 3 (three) times daily.    . potassium chloride (K-DUR) 10 MEQ tablet Take 20 mEq by mouth daily.     [name] tiotropium (SPIRIVA) 18 MCG inhalation capsule Place 18 mcg into inhaler and inhale daily.    Alveda Reasons 20 MG TABS tablet      No current facility-administered medications on file prior to visit.     There are no Patient Instructions on file for this visit. No Follow-up on file.   Bayne Fosnaugh A Kyrell Ruacho, PA-C

## 2017-06-30 ENCOUNTER — Encounter: Payer: Self-pay | Admitting: Gastroenterology

## 2017-06-30 ENCOUNTER — Ambulatory Visit (INDEPENDENT_AMBULATORY_CARE_PROVIDER_SITE_OTHER): Payer: Medicare Other | Admitting: Gastroenterology

## 2017-06-30 VITALS — BP 114/66 | HR 96 | Temp 98.7°F | Ht 71.0 in | Wt 202.0 lb

## 2017-06-30 DIAGNOSIS — I70235 Atherosclerosis of native arteries of right leg with ulceration of other part of foot: Secondary | ICD-10-CM | POA: Diagnosis not present

## 2017-06-30 DIAGNOSIS — D509 Iron deficiency anemia, unspecified: Secondary | ICD-10-CM | POA: Diagnosis not present

## 2017-06-30 DIAGNOSIS — K3189 Other diseases of stomach and duodenum: Secondary | ICD-10-CM

## 2017-06-30 DIAGNOSIS — K31A0 Gastric intestinal metaplasia, unspecified: Secondary | ICD-10-CM

## 2017-06-30 NOTE — Progress Notes (Signed)
Jonathon Bellows MD, MRCP(U.K) [address]  [place]  [place], [place] [address]  Main: [phone]  Fax: [phone]   Primary Care Physician: Alvester Morin, MD  Primary Gastroenterologist:  Dr. Jonathon Bellows   No chief complaint on file.   HPI: Timothy Juarez is a 66 y.o. male   Summary of history : He is being followed for iron deficiency anemia  initially seen on 05/12/17 . He has a history of recurrent DVT , lives at a nursing home , on eloquis . Recent labs suggest microcytic anemia. FOBT positive x 3 .Hb has been trending down .He denies any change in bowel habits, no blood in stool , never had a colonoscopy . No family history of colon cancer. No weight loss. Cant recall which blood thinner he is on .   Interval history   05/12/2017-  06/30/2017   05/25/17- EGD -hiatal hernia, diffuse candidiasis seen , dudoenal bx showed normal villu . Gastric bx showed erosive gastritis with intestinal metaplasia, negative for H pylori. Colonoscopy- polyps in transverse colon were tubulovillous with high grade dysplasia.  Ascending and descending colon polyps were tubular adenoma. Some polyps were taken out piece meal       Current Outpatient Medications  Medication Sig Dispense Refill  . acetaminophen (TYLENOL) 325 MG tablet Take 650 mg by mouth at bedtime.     [name] allopurinol (ZYLOPRIM) 100 MG tablet     . aspirin EC 81 MG EC tablet Take 1 tablet (81 mg total) by mouth daily. 30 tablet 11  . baclofen (LIORESAL) 10 MG tablet Take 10 mg by mouth 3 (three) times daily. Hold for sedation    . budesonide-formoterol (SYMBICORT) 80-4.5 MCG/ACT inhaler Inhale 2 puffs into the lungs 2 (two) times daily.    [name] enoxaparin (LOVENOX) 100 MG/ML injection Inject 1 mL (100 mg total) into the skin every 12 (twelve) hours. 3 Syringe 0  . enoxaparin (LOVENOX) 100 MG/ML injection Inject 100 mg into the skin.    . fentaNYL (DURAGESIC - DOSED MCG/HR) 12 MCG/HR Place 12 mcg onto the skin every 3  (three) days.     . furosemide (LASIX) 40 MG tablet Take 40 mg by mouth 2 (two) times daily.     [name] gabapentin (NEURONTIN) 100 MG capsule Take 100 mg by mouth at bedtime.    . Melatonin 5 MG CAPS Take 1 capsule by mouth at bedtime as needed.    . metoprolol tartrate (LOPRESSOR) 25 MG tablet Take 0.5 tablets (12.5 mg total) by mouth 2 (two) times daily. 60 tablet 5  . Omega-3 Fatty Acids (FISH OIL) 1000 MG CAPS Take 1,000 mg by mouth every morning.    . Oxycodone HCl 10 MG TABS Take 10 mg by mouth 3 (three) times daily.    . potassium chloride (K-DUR) 10 MEQ tablet Take 20 mEq by mouth daily.     [name] tiotropium (SPIRIVA) 18 MCG inhalation capsule Place 18 mcg into inhaler and inhale daily.    Alveda Reasons 20 MG TABS tablet      No current facility-administered medications for this visit.     Allergies as of 06/30/2017  . (No Known Allergies)    ROS:  General: Negative for anorexia, weight loss, fever, chills, fatigue, weakness. ENT: Negative for hoarseness, difficulty swallowing , nasal congestion. CV: Negative for chest pain, angina, palpitations, dyspnea on exertion, peripheral edema.  Respiratory: Negative for dyspnea at rest, dyspnea on exertion, cough, sputum, wheezing.  GI: See history of  present illness. GU:  Negative for dysuria, hematuria, urinary incontinence, urinary frequency, nocturnal urination.  Endo: Negative for unusual weight change.    Physical Examination:   There were no vitals taken for this visit.  General: Well-nourished, well-developed in no acute distress.  Eyes: No icterus. Conjunctivae pink. Mouth: Oropharyngeal mucosa moist and pink , no lesions erythema or exudate. Lungs: Clear to auscultation bilaterally. Non-labored. Heart: Regular rate and rhythm, no murmurs rubs or gallops.  Abdomen: Bowel sounds are normal, nontender, nondistended, no hepatosplenomegaly or masses, no abdominal bruits or hernia , no rebound or guarding.   Extremities: Left BKA Skin:  Warm and dry, no jaundice.   Psych: Alert and cooperative, normal mood and affect.   Imaging Studies: No results found.  Assessment and Plan:   Timothy Juarez is a 66 y.o. y/o male here to follow up  for iron deficiency anemia.  EGD showed gastritis with gastric intestinal metaplsia and no dysplasia. Multiple colon polyps and a few in the transverse colon had high grade dysplasia  Plan  1. Capsule study of the small bowel (can stay on blood thinners for the procedure) 2. Repeat EGD for gastric mapping and colonoscopy for evaluation of any residual polyps in 6 months 3. Xarelto holding instructions from Dr Tasia Catchings office for procedures in 6 months  4. Stool H pylori antigen 5. Copy of my note given to patient   Risks, benefits, alternatives of Givens capsule discussed with patient to include but not limited to the rare risk of Given's capsule becoming lodged in the GI tract requiring surgical removal.  The patient agrees with this plan & consent will be obtained.   I have discussed alternative options, risks & benefits,  which include, but are not limited to, bleeding, infection, perforation,respiratory complication & drug [id number].  The patient agrees with this plan & written consent will be obtained.   Dr Jonathon Bellows  MD,MRCP [facility]) Follow up in 3 months

## 2017-07-14 ENCOUNTER — Encounter
Admission: RE | Disposition: A | Discharge status: Home / Self Care | Payer: Self-pay | Source: Ambulatory Visit | Attending: Gastroenterology

## 2017-07-14 ENCOUNTER — Ambulatory Visit
Admission: RE | Admit: 2017-07-14 | Discharge: 2017-07-14 | Disposition: A | Discharge status: Home / Self Care | Payer: Medicare Other | Source: Ambulatory Visit | Attending: Gastroenterology | Admitting: Gastroenterology

## 2017-07-14 DIAGNOSIS — D508 Other iron deficiency anemias: Secondary | ICD-10-CM | POA: Diagnosis not present

## 2017-07-14 DIAGNOSIS — D509 Iron deficiency anemia, unspecified: Secondary | ICD-10-CM | POA: Insufficient documentation

## 2017-07-14 DIAGNOSIS — I5189 Other ill-defined heart diseases: Secondary | ICD-10-CM | POA: Diagnosis not present

## 2017-07-14 DIAGNOSIS — Z789 Other specified health status: Secondary | ICD-10-CM | POA: Diagnosis not present

## 2017-07-14 DIAGNOSIS — J984 Other disorders of lung: Secondary | ICD-10-CM | POA: Diagnosis not present

## 2017-07-14 HISTORY — PX: GIVENS CAPSULE STUDY: SHX5432

## 2017-07-14 SURGERY — IMAGING PROCEDURE, GI TRACT, INTRALUMINAL, VIA CAPSULE

## 2017-07-15 ENCOUNTER — Encounter: Payer: Self-pay | Admitting: Gastroenterology

## 2017-07-15 DIAGNOSIS — D509 Iron deficiency anemia, unspecified: Secondary | ICD-10-CM | POA: Diagnosis not present

## 2017-07-20 DIAGNOSIS — I10 Essential (primary) hypertension: Secondary | ICD-10-CM | POA: Diagnosis not present

## 2017-07-20 DIAGNOSIS — E785 Hyperlipidemia, unspecified: Secondary | ICD-10-CM | POA: Diagnosis not present

## 2017-07-23 DIAGNOSIS — I7389 Other specified peripheral vascular diseases: Secondary | ICD-10-CM | POA: Diagnosis not present

## 2017-07-23 DIAGNOSIS — J984 Other disorders of lung: Secondary | ICD-10-CM | POA: Diagnosis not present

## 2017-07-23 DIAGNOSIS — I10 Essential (primary) hypertension: Secondary | ICD-10-CM | POA: Diagnosis not present

## 2017-07-23 DIAGNOSIS — I5021 Acute systolic (congestive) heart failure: Secondary | ICD-10-CM | POA: Diagnosis not present

## 2017-07-28 DIAGNOSIS — D649 Anemia, unspecified: Secondary | ICD-10-CM | POA: Diagnosis not present

## 2017-07-29 DIAGNOSIS — R918 Other nonspecific abnormal finding of lung field: Secondary | ICD-10-CM | POA: Diagnosis not present

## 2017-07-30 DIAGNOSIS — J188 Other pneumonia, unspecified organism: Secondary | ICD-10-CM | POA: Diagnosis not present

## 2017-08-06 DIAGNOSIS — R2681 Unsteadiness on feet: Secondary | ICD-10-CM | POA: Diagnosis not present

## 2017-08-07 DIAGNOSIS — R2681 Unsteadiness on feet: Secondary | ICD-10-CM | POA: Diagnosis not present

## 2017-08-09 DIAGNOSIS — R2681 Unsteadiness on feet: Secondary | ICD-10-CM | POA: Diagnosis not present

## 2017-08-12 DIAGNOSIS — R2681 Unsteadiness on feet: Secondary | ICD-10-CM | POA: Diagnosis not present

## 2017-08-13 DIAGNOSIS — R2681 Unsteadiness on feet: Secondary | ICD-10-CM | POA: Diagnosis not present

## 2017-08-14 DIAGNOSIS — R2681 Unsteadiness on feet: Secondary | ICD-10-CM | POA: Diagnosis not present

## 2017-08-21 ENCOUNTER — Encounter: Payer: Self-pay | Admitting: Gastroenterology

## 2017-09-13 DIAGNOSIS — R079 Chest pain, unspecified: Secondary | ICD-10-CM | POA: Diagnosis not present

## 2017-09-15 DIAGNOSIS — R0789 Other chest pain: Secondary | ICD-10-CM | POA: Diagnosis not present

## 2017-10-10 ENCOUNTER — Other Ambulatory Visit: Payer: Self-pay

## 2017-10-10 ENCOUNTER — Encounter: Payer: Self-pay | Admitting: Emergency Medicine

## 2017-10-10 ENCOUNTER — Emergency Department: Payer: Medicare Other

## 2017-10-10 ENCOUNTER — Inpatient Hospital Stay
Admission: EM | Admit: 2017-10-10 | Discharge: 2017-10-15 | DRG: 356 | Disposition: A | Discharge status: Skilled Nursing Facility | Payer: Medicare Other | Attending: Internal Medicine | Admitting: Internal Medicine

## 2017-10-10 DIAGNOSIS — J449 Chronic obstructive pulmonary disease, unspecified: Secondary | ICD-10-CM | POA: Diagnosis not present

## 2017-10-10 DIAGNOSIS — I82512 Chronic embolism and thrombosis of left femoral vein: Secondary | ICD-10-CM

## 2017-10-10 DIAGNOSIS — K922 Gastrointestinal hemorrhage, unspecified: Secondary | ICD-10-CM

## 2017-10-10 DIAGNOSIS — Z7951 Long term (current) use of inhaled steroids: Secondary | ICD-10-CM

## 2017-10-10 DIAGNOSIS — I739 Peripheral vascular disease, unspecified: Secondary | ICD-10-CM | POA: Diagnosis not present

## 2017-10-10 DIAGNOSIS — I5032 Chronic diastolic (congestive) heart failure: Secondary | ICD-10-CM | POA: Diagnosis not present

## 2017-10-10 DIAGNOSIS — E538 Deficiency of other specified B group vitamins: Secondary | ICD-10-CM | POA: Diagnosis not present

## 2017-10-10 DIAGNOSIS — D62 Acute posthemorrhagic anemia: Secondary | ICD-10-CM | POA: Diagnosis not present

## 2017-10-10 DIAGNOSIS — Z87891 Personal history of nicotine dependence: Secondary | ICD-10-CM | POA: Diagnosis not present

## 2017-10-10 DIAGNOSIS — D124 Benign neoplasm of descending colon: Secondary | ICD-10-CM | POA: Diagnosis present

## 2017-10-10 DIAGNOSIS — K921 Melena: Principal | ICD-10-CM | POA: Diagnosis present

## 2017-10-10 DIAGNOSIS — Z8249 Family history of ischemic heart disease and other diseases of the circulatory system: Secondary | ICD-10-CM | POA: Diagnosis not present

## 2017-10-10 DIAGNOSIS — D500 Iron deficiency anemia secondary to blood loss (chronic): Secondary | ICD-10-CM | POA: Diagnosis not present

## 2017-10-10 DIAGNOSIS — J189 Pneumonia, unspecified organism: Secondary | ICD-10-CM | POA: Diagnosis present

## 2017-10-10 DIAGNOSIS — R079 Chest pain, unspecified: Secondary | ICD-10-CM | POA: Diagnosis not present

## 2017-10-10 DIAGNOSIS — D509 Iron deficiency anemia, unspecified: Secondary | ICD-10-CM

## 2017-10-10 DIAGNOSIS — Z7901 Long term (current) use of anticoagulants: Secondary | ICD-10-CM

## 2017-10-10 DIAGNOSIS — I110 Hypertensive heart disease with heart failure: Secondary | ICD-10-CM | POA: Diagnosis present

## 2017-10-10 DIAGNOSIS — D649 Anemia, unspecified: Secondary | ICD-10-CM

## 2017-10-10 DIAGNOSIS — Z7982 Long term (current) use of aspirin: Secondary | ICD-10-CM

## 2017-10-10 DIAGNOSIS — I248 Other forms of acute ischemic heart disease: Secondary | ICD-10-CM | POA: Diagnosis present

## 2017-10-10 DIAGNOSIS — I82502 Chronic embolism and thrombosis of unspecified deep veins of left lower extremity: Secondary | ICD-10-CM

## 2017-10-10 DIAGNOSIS — I82409 Acute embolism and thrombosis of unspecified deep veins of unspecified lower extremity: Secondary | ICD-10-CM | POA: Diagnosis not present

## 2017-10-10 DIAGNOSIS — Z89612 Acquired absence of left leg above knee: Secondary | ICD-10-CM

## 2017-10-10 DIAGNOSIS — Z86718 Personal history of other venous thrombosis and embolism: Secondary | ICD-10-CM | POA: Diagnosis not present

## 2017-10-10 DIAGNOSIS — K2970 Gastritis, unspecified, without bleeding: Secondary | ICD-10-CM

## 2017-10-10 DIAGNOSIS — E785 Hyperlipidemia, unspecified: Secondary | ICD-10-CM | POA: Diagnosis not present

## 2017-10-10 DIAGNOSIS — D6862 Lupus anticoagulant syndrome: Secondary | ICD-10-CM | POA: Diagnosis not present

## 2017-10-10 LAB — CBC
HEMATOCRIT: 19.1 % — AB (ref 40.0–52.0)
Hemoglobin: 5.3 g/dL — ABNORMAL LOW (ref 13.0–18.0)
MCH: 15.1 pg — ABNORMAL LOW (ref 26.0–34.0)
MCHC: 28 g/dL — AB (ref 32.0–36.0)
MCV: 54.2 fL — AB (ref 80.0–100.0)
Platelets: 422 10*3/uL (ref 150–440)
RBC: 3.52 MIL/uL — ABNORMAL LOW (ref 4.40–5.90)
RDW: 23 % — AB (ref 11.5–14.5)
WBC: 13 10*3/uL — ABNORMAL HIGH (ref 3.8–10.6)

## 2017-10-10 LAB — PROCALCITONIN: Procalcitonin: <0.1 ng/mL

## 2017-10-10 LAB — BASIC METABOLIC PANEL
Anion gap: 9 (ref 5–15)
BUN: 21 mg/dL — AB (ref 6–20)
CHLORIDE: 108 mmol/L (ref 101–111)
CO2: 21 mmol/L — AB (ref 22–32)
Calcium: 8.5 mg/dL — ABNORMAL LOW (ref 8.9–10.3)
Creatinine, Ser: 1.3 mg/dL — ABNORMAL HIGH (ref 0.61–1.24)
GFR calc Af Amer: >60 mL/min (ref >60)
GFR calc non Af Amer: 56 mL/min — ABNORMAL LOW (ref >60)
GLUCOSE: 121 mg/dL — AB (ref 65–99)
POTASSIUM: 3.8 mmol/L (ref 3.5–5.1)
Sodium: 138 mmol/L (ref 135–145)

## 2017-10-10 LAB — APTT: APTT: 46 s — AB (ref 24–36)

## 2017-10-10 LAB — HEMOGLOBIN AND HEMATOCRIT, BLOOD
HCT: 26.4 % — ABNORMAL LOW (ref 40.0–52.0)
Hemoglobin: 8.2 g/dL — ABNORMAL LOW (ref 13.0–18.0)

## 2017-10-10 LAB — TROPONIN I
Troponin I: 0.03 ng/mL (ref <0.03)
Troponin I: 0.56 ng/mL (ref <0.03)

## 2017-10-10 LAB — IRON AND TIBC
Iron: 24 ug/dL — ABNORMAL LOW (ref 45–182)
SATURATION RATIOS: 6 % — AB (ref 17.9–39.5)
TIBC: 419 ug/dL (ref 250–450)
UIBC: 395 ug/dL

## 2017-10-10 LAB — PROTIME-INR
INR: 2.39
Prothrombin Time: 25.9 seconds — ABNORMAL HIGH (ref 11.4–15.2)

## 2017-10-10 LAB — FERRITIN: Ferritin: 18 ng/mL — ABNORMAL LOW (ref 24–336)

## 2017-10-10 LAB — PREPARE RBC (CROSSMATCH)

## 2017-10-10 LAB — MRSA PCR SCREENING: MRSA by PCR: NEGATIVE

## 2017-10-10 MED ORDER — SODIUM CHLORIDE 0.9% FLUSH
3.0000 mL | Freq: Two times a day (BID) | INTRAVENOUS | Status: DC
  Administered 2017-10-10 – 2017-10-15 (×9): 3 mL via INTRAVENOUS

## 2017-10-10 MED ORDER — TIOTROPIUM BROMIDE MONOHYDRATE 18 MCG IN CAPS
18.0000 ug | ORAL_CAPSULE | Freq: Every day | RESPIRATORY_TRACT | Status: DC
  Administered 2017-10-10 – 2017-10-15 (×6): 18 ug via RESPIRATORY_TRACT
  Filled 2017-10-10: qty 5

## 2017-10-10 MED ORDER — ACETAMINOPHEN 650 MG RE SUPP: 650.0000 mg | Freq: Four times a day (QID) | RECTAL | Status: DC | PRN

## 2017-10-10 MED ORDER — IRON SUCROSE 20 MG/ML IV SOLN
200.0000 mg | Freq: Once | INTRAVENOUS | Status: AC
  Administered 2017-10-11: 200 mg via INTRAVENOUS
  Filled 2017-10-10: qty 10

## 2017-10-10 MED ORDER — OXYCODONE HCL 10 MG PO TABS: 10.0000 mg | ORAL_TABLET | Freq: Three times a day (TID) | ORAL | Status: DC

## 2017-10-10 MED ORDER — PANTOPRAZOLE SODIUM 40 MG IV SOLR
40.0000 mg | Freq: Once | INTRAVENOUS | Status: AC
  Administered 2017-10-10: 40 mg via INTRAVENOUS
  Filled 2017-10-10: qty 40

## 2017-10-10 MED ORDER — ONDANSETRON HCL 4 MG PO TABS: 4.0000 mg | ORAL_TABLET | Freq: Four times a day (QID) | ORAL | Status: DC | PRN

## 2017-10-10 MED ORDER — CEFTRIAXONE SODIUM 1 G IJ SOLR
1.0000 g | INTRAMUSCULAR | Status: DC
  Administered 2017-10-10: 1 g via INTRAVENOUS
  Filled 2017-10-10: qty 10

## 2017-10-10 MED ORDER — ONDANSETRON HCL 4 MG/2ML IJ SOLN: 4.0000 mg | Freq: Four times a day (QID) | INTRAMUSCULAR | Status: DC | PRN

## 2017-10-10 MED ORDER — FUROSEMIDE 20 MG PO TABS
20.0000 mg | ORAL_TABLET | Freq: Once | ORAL | Status: AC
  Administered 2017-10-10: 20 mg via ORAL
  Filled 2017-10-10: qty 1

## 2017-10-10 MED ORDER — OXYCODONE HCL 5 MG PO TABS: 10.0000 mg | ORAL_TABLET | Freq: Three times a day (TID) | ORAL | Status: DC | PRN

## 2017-10-10 MED ORDER — SODIUM CHLORIDE 0.9% FLUSH
3.0000 mL | INTRAVENOUS | Status: DC | PRN
  Administered 2017-10-10: 3 mL via INTRAVENOUS

## 2017-10-10 MED ORDER — SODIUM CHLORIDE 0.9 % IV SOLN
10.0000 mL/h | Freq: Once | INTRAVENOUS | Status: AC
  Administered 2017-10-10: 10 mL/h via INTRAVENOUS

## 2017-10-10 MED ORDER — AZITHROMYCIN 500 MG PO TABS
500.0000 mg | ORAL_TABLET | Freq: Every day | ORAL | Status: AC
  Administered 2017-10-10: 500 mg via ORAL
  Filled 2017-10-10: qty 1
  Filled 2017-10-10: qty 2

## 2017-10-10 MED ORDER — PANTOPRAZOLE SODIUM 40 MG IV SOLR
40.0000 mg | Freq: Two times a day (BID) | INTRAVENOUS | Status: DC
  Administered 2017-10-10 – 2017-10-15 (×10): 40 mg via INTRAVENOUS
  Filled 2017-10-10 (×10): qty 40

## 2017-10-10 MED ORDER — FENTANYL 12 MCG/HR TD PT72
12.5000 ug | MEDICATED_PATCH | TRANSDERMAL | Status: DC
  Administered 2017-10-10 – 2017-10-13 (×2): 12.5 ug via TRANSDERMAL
  Filled 2017-10-10 (×2): qty 1

## 2017-10-10 MED ORDER — MOMETASONE FURO-FORMOTEROL FUM 100-5 MCG/ACT IN AERO
2.0000 | INHALATION_SPRAY | Freq: Two times a day (BID) | RESPIRATORY_TRACT | Status: DC
  Administered 2017-10-10 – 2017-10-15 (×11): 2 via RESPIRATORY_TRACT
  Filled 2017-10-10: qty 8.8

## 2017-10-10 MED ORDER — ALBUTEROL SULFATE (2.5 MG/3ML) 0.083% IN NEBU: 2.5000 mg | INHALATION_SOLUTION | RESPIRATORY_TRACT | Status: DC | PRN

## 2017-10-10 MED ORDER — ACETAMINOPHEN 325 MG PO TABS: 650.0000 mg | ORAL_TABLET | Freq: Four times a day (QID) | ORAL | Status: DC | PRN

## 2017-10-10 MED ORDER — AZITHROMYCIN 250 MG PO TABS
250.0000 mg | ORAL_TABLET | Freq: Every day | ORAL | Status: DC
  Administered 2017-10-11 – 2017-10-13 (×3): 250 mg via ORAL
  Filled 2017-10-10 (×2): qty 1
  Filled 2017-10-10 (×2): qty 0.5

## 2017-10-10 NOTE — H&P (Signed)
[facility] [facility] [place] NAME: Timothy Juarez    MR#:  [id number]  DATE OF BIRTH:  December 16, 1950  DATE OF ADMISSION:  10/10/2017  PRIMARY CARE PHYSICIAN: Alvester Morin, MD   REQUESTING/REFERRING PHYSICIAN: Quale  CHIEF COMPLAINT:   Chest pain HISTORY OF PRESENT ILLNESS:  Timothy Juarez  is a 67 y.o. male with a known history of chronic anemia, recurrent DVTs, peripheral arterial disease, chronic congestive heart failure is presenting to the ED with a chief complaint of chest pain which is progressively getting worse for the past 2 weeks.  Initial troponin is negative but hemoglobin is at 5.3.  Patient is on Xarelto for recurrent DVTs and aspirin.  ED physician has ordered 2 units of blood and stool guaiac was positive.  Patient recently had a EGD as well as capsule study January 2019.  During my examination he is resting comfortably denies any chest pain or abdominal pain.  Denies any active bleeding.  No family members at bedside  PAST MEDICAL HISTORY:   Past Medical History:  Diagnosis Date  . Allergy   . Anemia   . ARF (acute respiratory failure) (HCC)    H/O  . CHF (congestive heart failure) ([place])   . COPD (chronic obstructive pulmonary disease) ([place])   . Hypertension   . Hypokalemia   . Insomnia   . Muscle contracture    MUSCLE SPASMS  . Peripheral vascular disease ([place])   . Pressure ulcer     PAST SURGICAL HISTOIRY:   Past Surgical History:  Procedure Laterality Date  . AMPUTATION Left 09/19/2015   Procedure: AMPUTATION BELOW KNEE;  Surgeon: Algernon Huxley, MD;  Location: [facility] ORS;  Service: Vascular;  Laterality: Left;  . AMPUTATION Left 11/01/2015   Procedure: AMPUTATION ABOVE KNEE;  Surgeon: Algernon Huxley, MD;  Location: [facility] ORS;  Service: Vascular;  Laterality: Left;  . APPLICATION OF WOUND VAC Left 10/18/2015   Procedure: APPLICATION OF WOUND VAC;  Surgeon: Algernon Huxley, MD;  Location: [facility] ORS;  Service: Vascular;   Laterality: Left;  . COLONOSCOPY WITH PROPOFOL N/A 05/25/2017   Procedure: COLONOSCOPY WITH PROPOFOL;  Surgeon: Jonathon Bellows, MD;  Location: [facility] ENDOSCOPY;  Service: Gastroenterology;  Laterality: N/A;  . ESOPHAGOGASTRODUODENOSCOPY (EGD) WITH PROPOFOL N/A 05/25/2017   Procedure: ESOPHAGOGASTRODUODENOSCOPY (EGD) WITH PROPOFOL;  Surgeon: Jonathon Bellows, MD;  Location: [facility] ENDOSCOPY;  Service: Gastroenterology;  Laterality: N/A;  . GIVENS CAPSULE STUDY N/A 07/14/2017   Procedure: GIVENS CAPSULE STUDY;  Surgeon: Jonathon Bellows, MD;  Location: [facility] ENDOSCOPY;  Service: Gastroenterology;  Laterality: N/A;  . PERIPHERAL VASCULAR CATHETERIZATION Left 01/18/2015   Procedure: Lower Extremity Angiography;  Surgeon: Algernon Huxley, MD;  Location: [place] CV LAB;  Service: Cardiovascular;  Laterality: Left;  . PERIPHERAL VASCULAR CATHETERIZATION N/A 01/18/2015   Procedure: Lower Extremity Intervention;  Surgeon: Algernon Huxley, MD;  Location: [place] CV LAB;  Service: Cardiovascular;  Laterality: N/A;  . PERIPHERAL VASCULAR CATHETERIZATION  07/30/2015   Procedure: Lower Extremity Intervention;  Surgeon: Algernon Huxley, MD;  Location: [place] CV LAB;  Service: Cardiovascular;;  . PERIPHERAL VASCULAR CATHETERIZATION N/A 07/30/2015   Procedure: Abdominal Aortogram w/Lower Extremity;  Surgeon: Algernon Huxley, MD;  Location: [place] CV LAB;  Service: Cardiovascular;  Laterality: N/A;  . PERIPHERAL VASCULAR CATHETERIZATION Left 08/22/2015   Procedure: Lower Extremity Angiography;  Surgeon: Algernon Huxley, MD;  Location: [place] CV LAB;  Service: Cardiovascular;  Laterality: Left;  . PERIPHERAL  VASCULAR CATHETERIZATION Left 08/22/2015   Procedure: Lower Extremity Intervention;  Surgeon: Algernon Huxley, MD;  Location: [place] CV LAB;  Service: Cardiovascular;  Laterality: Left;  . PERIPHERAL VASCULAR CATHETERIZATION Right 03/31/2016   Procedure: Lower Extremity Angiography;  Surgeon: Algernon Huxley, MD;  Location:  [place] CV LAB;  Service: Cardiovascular;  Laterality: Right;  . PERIPHERAL VASCULAR CATHETERIZATION  03/31/2016   Procedure: Lower Extremity Intervention;  Surgeon: Algernon Huxley, MD;  Location: [place] CV LAB;  Service: Cardiovascular;;  . PERIPHERAL VASCULAR CATHETERIZATION Left 04/10/2016   Procedure: Lower Extremity Angiography;  Surgeon: Algernon Huxley, MD;  Location: [place] CV LAB;  Service: Cardiovascular;  Laterality: Left;  . WOUND DEBRIDEMENT Left 10/18/2015   Procedure: DEBRIDEMENT WOUND   ( LEFT BKA DEBRIDEMENT );  Surgeon: Algernon Huxley, MD;  Location: [facility] ORS;  Service: Vascular;  Laterality: Left;    SOCIAL HISTORY:   Social History   Tobacco Use  . Smoking status: Former Smoker    Packs/day: 1.00    Years: 44.00    Pack years: 44.00    Types: Cigarettes    Last attempt to quit: 11/17/2012    Years since quitting: 4.8  . Smokeless tobacco: Never Used  Substance Use Topics  . Alcohol use: No    FAMILY HISTORY:  Hypertension runs in his family  DRUG ALLERGIES:  No Known Allergies  REVIEW OF SYSTEMS:  CONSTITUTIONAL: No fever, fatigue or weakness.  EYES: No blurred or double vision.  EARS, NOSE, AND THROAT: No tinnitus or ear pain.  RESPIRATORY: No cough, shortness of breath, wheezing or hemoptysis.  CARDIOVASCULAR: No chest pain, orthopnea, edema.  GASTROINTESTINAL: No nausea, vomiting, diarrhea or abdominal pain.  GENITOURINARY: No dysuria, hematuria.  ENDOCRINE: No polyuria, nocturia,  HEMATOLOGY: No anemia, easy bruising or bleeding SKIN: No rash or lesion. MUSCULOSKELETAL: No joint pain or arthritis.   NEUROLOGIC: No tingling, numbness, weakness.  PSYCHIATRY: No anxiety or depression.   MEDICATIONS AT HOME:   Prior to Admission medications   Medication Sig Start Date End Date Taking? Authorizing Provider  acetaminophen (TYLENOL) 325 MG tablet Take 650 mg by mouth at bedtime.     [provider]  allopurinol (ZYLOPRIM) 100 MG tablet   03/19/17   [provider]  aspirin EC 81 MG EC tablet Take 1 tablet (81 mg total) by mouth daily. 09/24/15   Algernon Huxley, MD  baclofen (LIORESAL) 10 MG tablet Take 10 mg by mouth 3 (three) times daily. Hold for sedation    [provider]  budesonide-formoterol (SYMBICORT) 80-4.5 MCG/ACT inhaler Inhale 2 puffs into the lungs 2 (two) times daily.    [provider]  enoxaparin (LOVENOX) 100 MG/ML injection  05/23/17   [provider]  fentaNYL (DURAGESIC - DOSED MCG/HR) 12 MCG/HR Place 12 mcg onto the skin every 3 (three) days.  10/28/16   [provider]  furosemide (LASIX) 40 MG tablet Take 40 mg by mouth 2 (two) times daily.     [provider]  gabapentin (NEURONTIN) 100 MG capsule Take 100 mg by mouth at bedtime.    [provider]  Melatonin 5 MG CAPS Take 1 capsule by mouth at bedtime as needed.    [provider]  metoprolol tartrate (LOPRESSOR) 25 MG tablet Take 0.5 tablets (12.5 mg total) by mouth 2 (two) times daily. 09/24/15   Algernon Huxley, MD  Omega-3 Fatty Acids (FISH OIL) 1000 MG CAPS Take 1,000 mg by  mouth every morning.    [provider]  Oxycodone HCl 10 MG TABS Take 10 mg by mouth 3 (three) times daily.    [provider]  potassium chloride (K-DUR) 10 MEQ tablet Take 20 mEq by mouth daily.     [provider]  tiotropium (SPIRIVA) 18 MCG inhalation capsule Place 18 mcg into inhaler and inhale daily.    [provider]  XARELTO 20 MG TABS tablet  04/16/17   [provider]      VITAL SIGNS:  Blood pressure 133/68, pulse 91, temperature 98.3 F (36.8 C), temperature source Oral, resp. rate 18, height 6\' 1"  (1.854 m), weight 88.6 kg (195 lb 4.8 oz), SpO2 100 %.  PHYSICAL EXAMINATION:  GENERAL:  67 y.o.-year-old patient lying in the bed with no acute distress.  EYES: Pupils equal, round, reactive to light and accommodation. No scleral icterus. Extraocular muscles  intact.  HEENT: Head atraumatic, normocephalic. Oropharynx and nasopharynx clear.  NECK:  Supple, no jugular venous distention. No thyroid enlargement, no tenderness.  LUNGS: Normal breath sounds bilaterally, no wheezing, rales,rhonchi or crepitation. No use of accessory muscles of respiration.  CARDIOVASCULAR: S1, S2 normal. No murmurs, rubs, or gallops.  ABDOMEN: Soft, nontender, nondistended. Bowel sounds present. No organomegaly or mass.  EXTREMITIES: Left BKA no pedal edema, cyanosis, or clubbing.  NEUROLOGIC: Cranial nerves II through XII are intact. Muscle strength 5/5 in all extremities. Sensation intact. Gait not checked.  PSYCHIATRIC: The patient is alert and oriented x 3.  SKIN: No obvious rash, lesion, or ulcer.   LABORATORY PANEL:   CBC Recent Labs  Lab 10/10/17 0706  WBC 13.0*  HGB 5.3*  HCT 19.1*  PLT 422   ------------------------------------------------------------------------------------------------------------------  Chemistries  Recent Labs  Lab 10/10/17 0706  NA 138  K 3.8  CL 108  CO2 21*  GLUCOSE 121*  BUN 21*  CREATININE 1.30*  CALCIUM 8.5*   ------------------------------------------------------------------------------------------------------------------  Cardiac Enzymes Recent Labs  Lab 10/10/17 0706  TROPONINI 0.03*   ------------------------------------------------------------------------------------------------------------------  RADIOLOGY:  Dg Chest 2 View  Result Date: 10/10/2017 CLINICAL DATA:  Chest pain. EXAM: CHEST  2 VIEW COMPARISON:  November 07, 2015 FINDINGS: Chronic changes in the left apex are stable. Reticular changes in the right upper lung are mildly more prominent the interval. No pneumothorax. The cardiomediastinal silhouette is stable. No other acute abnormalities. IMPRESSION: 1. Mild reticular changes in the right upper lung are more prominent since March 2017 but may have been present at that time. This may be a  progressive chronic process. However, a subtle acute infiltrate is not excluded. 2. Chronic changes in the left apex. Electronically Signed   By: Dorise Bullion III M.D   On: 10/10/2017 07:34    EKG:   Orders placed or performed during the hospital encounter of 10/10/17  . ED EKG within 10 minutes  . ED EKG within 10 minutes  . EKG 12-Lead  . EKG 12-Lead    IMPRESSION AND PLAN:     #Symptomatic anemia-acute on chronic Admit to telemetry Monitor hemoglobin and hematocrit, transfuse as needed Discontinuing Xarelto and aspirin at this time Protonix Patient is receiving 2 units of blood GI and oncology consult Patient recently had a EGD, capsule study by gastroenterology Stool guaiac was positive in the emergency department  #History of recurrent DVTs Hold Xarelto and aspirin at this time Consult placed to oncology Dr. Tasia Catchings  #Chest pain could be demand ischemia Monitor patient on telemetry, patient is a symptomatic during my  examination Cycle cardiac biomarkers Will get echocardiogram Cardiology consult if needed   #Community acquired pneumonia Chest x-ray with possible infiltrate and patient is reporting chest pain which could be pleuritic Rocephin and azithromycin and sputum culture and sensitivity. Supportive treatment   #Peripheral arterial disease left BKA Sees Dr. Lucky Cowboy as an outpatient Holding Xarelto and aspirin at this time    DVT reflexes with SCDs GI prophylaxis with Protonix    All the records are reviewed and case discussed with ED provider. Management plans discussed with the patient, family and they are in agreement.  CODE STATUS: fc   TOTAL TIME TAKING CARE OF THIS PATIENT: 43 minutes.   Note: This dictation was prepared with Dragon dictation along with smaller phrase technology. Any transcriptional errors that result from this process are unintentional.  Nicholes Mango M.D on 10/10/2017 at 9:51 AM  Between 7am to 6pm - Pager -  [phone]  After 6pm go to [email or web address] - password [email or web address]  Office  [phone]  CC: Primary care physician; Alvester Morin, MD

## 2017-10-10 NOTE — Progress Notes (Signed)
Family Meeting Note  Advance Directive:yes  Today a meeting took place with the Patient.     The following clinical team members were present during this meeting:MD  The following were discussed:Patient's diagnosis: Chest pain, symptomatic anemia, recurrent DVTs, congestive heart failure, peripheral arterial disease, left AKA , Patient's progosis: Unable to determine and Goals for treatment: Full Code,, son Zeb and daughter Doroteo Glassman of the healthcare power of attorney  Additional follow-up to be provided: Hospitalist, gastroenterology and oncology  Time spent during discussion:18 min   Nicholes Mango, MD

## 2017-10-10 NOTE — Consult Note (Signed)
Timothy Antigua, MD [address], [place], [place], [place], [id number], [place], [place], [place], [address] Phone: [phone]  Fax: [phone]  Consultation  Referring Provider:     [name] Primary Care Physician:  Timothy Morin, MD Primary Gastroenterologist: Dr. Vicente Juarez Reason for Consultation:     Anemia  Date of Admission:  10/10/2017 Date of Consultation:  10/10/2017         HPI:   Timothy Juarez is a 67 y.o. male presents for chest pain, with initial troponin mildly elevated to 0.03, and hemoglobin noted to be acutely dropped to 5.3.  Patient is on Xarelto at home for DVT. No episodes of bleeding, no melena, hematochezia, hematemesis, epistaxis, hematuria. Chest pain ongoing for 2 weeks, not related to exertion, dull, midchest, 5/10, no dysphagia or heartburn.  Patient was referred to Dr. Vicente Juarez, due to iron deficiency anemia and has undergone workup.  Underwent EGD and colonoscopy in October 2018 which as per Dr. Georgeann Oppenheim notes showed "hiatal hernia, diffuse candidiasis seen , dudoenal bx showed normal villu . Gastric bx showed erosive gastritis with intestinal metaplasia, negative for H pylori. Colonoscopy- polyps in transverse colon were tubulovillous with high grade dysplasia.  Ascending and descending colon polyps were tubular adenoma. Some polyps were taken out piece meal".  Dr. Vicente Juarez subsequently did a small bowel capsule on November 2018, hematin was seen at the 1 hour and 2-minute mark.  The exam was limited due to biliary opaque fluid present.  Recommendation was to repeat the capsule endoscopy in 12 hours after prep.    Past Medical History:  Diagnosis Date  . Allergy   . Anemia   . ARF (acute respiratory failure) (HCC)    H/O  . CHF (congestive heart failure) ([facility])   . COPD (chronic obstructive pulmonary disease) ([facility])   . Hypertension   . Hypokalemia   . Insomnia   . Muscle contracture    MUSCLE SPASMS  . Peripheral vascular  disease ([facility])   . Pressure ulcer     Past Surgical History:  Procedure Laterality Date  . AMPUTATION Left 09/19/2015   Procedure: AMPUTATION BELOW KNEE;  Surgeon: Algernon Huxley, MD;  Location: [facility] ORS;  Service: Vascular;  Laterality: Left;  . AMPUTATION Left 11/01/2015   Procedure: AMPUTATION ABOVE KNEE;  Surgeon: Algernon Huxley, MD;  Location: [facility]S;  Service: Vascular;  Laterality: Left;  . APPLICATION OF WOUND VAC Left 10/18/2015   Procedure: APPLICATION OF WOUND VAC;  Surgeon: Algernon Huxley, MD;  Location: [facility];  Service: Vascular;  Laterality: Left;  . COLONOSCOPY WITH PROPOFOL N/A 05/25/2017   Procedure: COLONOSCOPY WITH PROPOFOL;  Surgeon: Jonathon Bellows, MD;  Location: [facility] ENDOSCOPY;  Service: Gastroenterology;  Laterality: N/A;  . ESOPHAGOGASTRODUODENOSCOPY (EGD) WITH PROPOFOL N/A 05/25/2017   Procedure: ESOPHAGOGASTRODUODENOSCOPY (EGD) WITH PROPOFOL;  Surgeon: Jonathon Bellows, MD;  Location: [facility];  Service: Gastroenterology;  Laterality: N/A;  . GIVENS CAPSULE STUDY N/A 07/14/2017   Procedure: GIVENS CAPSULE STUDY;  Surgeon: Jonathon Bellows, MD;  Location: [facility] ENDOSCOPY;  Service: Gastroenterology;  Laterality: N/A;  . PERIPHERAL VASCULAR CATHETERIZATION Left 01/18/2015   Procedure: Lower Extremity Angiography;  Surgeon: Algernon Huxley, MD;  Location: [place] CV LAB;  Service: Cardiovascular;  Laterality: Left;  . PERIPHERAL VASCULAR CATHETERIZATION N/A 01/18/2015   Procedure: Lower Extremity Intervention;  Surgeon: Algernon Huxley, MD;  Location: [place] CV LAB;  Service: Cardiovascular;  Laterality: N/A;  . PERIPHERAL VASCULAR CATHETERIZATION  07/30/2015  Procedure: Lower Extremity Intervention;  Surgeon: Algernon Huxley, MD;  Location: [place] CV LAB;  Service: Cardiovascular;;  . PERIPHERAL VASCULAR CATHETERIZATION N/A 07/30/2015   Procedure: Abdominal Aortogram w/Lower Extremity;  Surgeon: Algernon Huxley, MD;  Location: [place] CV LAB;  Service: Cardiovascular;  Laterality:  N/A;  . PERIPHERAL VASCULAR CATHETERIZATION Left 08/22/2015   Procedure: Lower Extremity Angiography;  Surgeon: Algernon Huxley, MD;  Location: [place] CV LAB;  Service: Cardiovascular;  Laterality: Left;  . PERIPHERAL VASCULAR CATHETERIZATION Left 08/22/2015   Procedure: Lower Extremity Intervention;  Surgeon: Algernon Huxley, MD;  Location: [place] CV LAB;  Service: Cardiovascular;  Laterality: Left;  . PERIPHERAL VASCULAR CATHETERIZATION Right 03/31/2016   Procedure: Lower Extremity Angiography;  Surgeon: Algernon Huxley, MD;  Location: [place] CV LAB;  Service: Cardiovascular;  Laterality: Right;  . PERIPHERAL VASCULAR CATHETERIZATION  03/31/2016   Procedure: Lower Extremity Intervention;  Surgeon: Algernon Huxley, MD;  Location: [place] CV LAB;  Service: Cardiovascular;;  . PERIPHERAL VASCULAR CATHETERIZATION Left 04/10/2016   Procedure: Lower Extremity Angiography;  Surgeon: Algernon Huxley, MD;  Location: [place] CV LAB;  Service: Cardiovascular;  Laterality: Left;  . WOUND DEBRIDEMENT Left 10/18/2015   Procedure: DEBRIDEMENT WOUND   ( LEFT BKA DEBRIDEMENT );  Surgeon: Algernon Huxley, MD;  Location: [facility] ORS;  Service: Vascular;  Laterality: Left;    Prior to Admission medications   Medication Sig Start Date End Date Taking? Authorizing Provider  acetaminophen (TYLENOL) 325 MG tablet Take 650 mg by mouth at bedtime.     [provider]  allopurinol (ZYLOPRIM) 100 MG tablet  03/19/17   [provider]  aspirin EC 81 MG EC tablet Take 1 tablet (81 mg total) by mouth daily. 09/24/15   Algernon Huxley, MD  baclofen (LIORESAL) 10 MG tablet Take 10 mg by mouth 3 (three) times daily. Hold for sedation    [provider]  budesonide-formoterol (SYMBICORT) 80-4.5 MCG/ACT inhaler Inhale 2 puffs into the lungs 2 (two) times daily.    [provider]  enoxaparin (LOVENOX) 100 MG/ML injection  05/23/17   [provider]  fentaNYL (DURAGESIC - DOSED MCG/HR) 12  MCG/HR Place 12 mcg onto the skin every 3 (three) days.  10/28/16   [provider]  furosemide (LASIX) 40 MG tablet Take 40 mg by mouth 2 (two) times daily.     [provider]  gabapentin (NEURONTIN) 100 MG capsule Take 100 mg by mouth at bedtime.    [provider]  Melatonin 5 MG CAPS Take 1 capsule by mouth at bedtime as needed.    [provider]  metoprolol tartrate (LOPRESSOR) 25 MG tablet Take 0.5 tablets (12.5 mg total) by mouth 2 (two) times daily. 09/24/15   Algernon Huxley, MD  Omega-3 Fatty Acids (FISH OIL) 1000 MG CAPS Take 1,000 mg by mouth every morning.    [provider]  Oxycodone HCl 10 MG TABS Take 10 mg by mouth 3 (three) times daily.    [provider]  potassium chloride (K-DUR) 10 MEQ tablet Take 20 mEq by mouth daily.     [provider]  tiotropium (SPIRIVA) 18 MCG inhalation capsule Place 18 mcg into inhaler and inhale daily.    [provider]  XARELTO 20 MG TABS tablet  04/16/17   [provider]    History reviewed. No pertinent family history.   Social History   Tobacco Use  .  Smoking status: Former Smoker    Packs/day: 1.00    Years: 44.00    Pack years: 44.00    Types: Cigarettes    Last attempt to quit: 11/17/2012    Years since quitting: 4.8  . Smokeless tobacco: Never Used  Substance Use Topics  . Alcohol use: No  . Drug use: No    Allergies as of 10/10/2017  . (No Known Allergies)    Review of Systems:    All systems reviewed and negative except where noted in HPI.   Physical Exam:  Vital signs in last 24 hours: Vitals:   10/10/17 0945 10/10/17 0950 10/10/17 1014 10/10/17 1030  BP: 133/68  140/61 137/60  Pulse: 91  97 90  Resp: 18  20 20   Temp: 98.3 F (36.8 C)  98.6 F (37 C) 98.7 F (37.1 C)  TempSrc: Oral  Oral Oral  SpO2: 100%  95% 93%  Weight:  195 lb 4.8 oz (88.6 kg)    Height:  6\' 1"  (1.854 m)       General:   Pleasant, cooperative in NAD Head:   Normocephalic and atraumatic. Eyes:   No icterus.   Conjunctiva pink. PERRLA. Ears:  Normal auditory acuity. Neck:  Supple; no masses or thyroidomegaly Lungs: Respirations even and unlabored. Lungs clear to auscultation bilaterally.   No wheezes, crackles, or rhonchi.  Heart:  Regular rate and rhythm;  Without murmur, clicks, rubs or gallops Abdomen:  Soft, nondistended, nontender. Normal bowel sounds. No appreciable masses or hepatomegaly.  No rebound or guarding.  Neurologic:  Alert and oriented x3;  grossly normal neurologically. Skin:  Intact without significant lesions or rashes. Cervical Nodes:  No significant cervical adenopathy. Psych:  Alert and cooperative. Normal affect.  LAB RESULTS: Recent Labs    10/10/17 0706  WBC 13.0*  HGB 5.3*  HCT 19.1*  PLT 422   BMET Recent Labs    10/10/17 0706  NA 138  K 3.8  CL 108  CO2 21*  GLUCOSE 121*  BUN 21*  CREATININE 1.30*  CALCIUM 8.5*   LFT No results for input(s): PROT, ALBUMIN, AST, ALT, ALKPHOS, BILITOT, BILIDIR, IBILI in the last 72 hours. PT/INR Recent Labs    10/10/17 0827  LABPROT 25.9*  INR 2.39    STUDIES: Dg Chest 2 View  Result Date: 10/10/2017 CLINICAL DATA:  Chest pain. EXAM: CHEST  2 VIEW COMPARISON:  November 07, 2015 FINDINGS: Chronic changes in the left apex are stable. Reticular changes in the right upper lung are mildly more prominent the interval. No pneumothorax. The cardiomediastinal silhouette is stable. No other acute abnormalities. IMPRESSION: 1. Mild reticular changes in the right upper lung are more prominent since March 2017 but may have been present at that time. This may be a progressive chronic process. However, a subtle acute infiltrate is not excluded. 2. Chronic changes in the left apex. Electronically Signed   By: Dorise Bullion III M.D   On: 10/10/2017 07:34      Impression / Plan:   Timothy Juarez is a 67 y.o. y/o male with recent workup including EGD, colonoscopy, and small  bowel capsule for iron deficiency anemia, presents with chest pain and noted to have hemoglobin of 5.3  Given recent Xarelto use, endoscopy unable to be done today, as it is a high risk procedure in the setting of recent anticoagulation use. Xarelto will need to be held for 3-5 days prior to the procedure In addition patient has had complete workup  recently, and hematin was noted at the 1 hour 2-minute mark in the small bowel capsule. Would recommend RBC scan or CT angiogram to evaluate etiology of the hematin noted on the small bowel capsule in November 2018. If positive consult Dr. Lucky Cowboy for embolization Avoid NSAIDs PPI IV twice daily Continue serial CBCs and transfuse PRN Pt. Needs transfusion at this time for medical optimization If no active GI bleeding, can perform Push Enteroscopy in 3-5 days after holding Xarelto, to evaluate the site of hematin seen on small bowel capsule. But this area may not be able to reached with enteroscope and RBC scan would be better in this scenario if pt has active bleeding.   Pt will need cardiac workup completion with serial Trops and cardiac clearance prior to endoscopy as well  Thank you for involving me in the care of this patient.      LOS: 0 days   Virgel Manifold, MD  10/10/2017, 11:02 AM

## 2017-10-10 NOTE — Progress Notes (Signed)
Pharmacy Antibiotic Note  Timothy Juarez is a 67 y.o. male admitted on 10/10/2017 with pneumonia.  Pharmacy has been consulted for ceftriaxone dosing.  Plan: Ceftriaxone 1 g IV q24h PCT ordered per protocol  Height: 6\' 1"  (185.4 cm) Weight: 202 lb (91.6 kg) IBW/kg (Calculated) : 79.9  Temp (24hrs), Avg:98.4 F (36.9 C), Min:98.4 F (36.9 C), Max:98.4 F (36.9 C)  Recent Labs  Lab 10/10/17 0706  WBC 13.0*  CREATININE 1.30*    Estimated Creatinine Clearance: 63.2 mL/min (A) (by C-G formula based on SCr of 1.3 mg/dL (H)).    No Known Allergies  Antimicrobials this admission: Ceftriaxone/azithro 2/23 >>  Dose adjustments this admission:   Microbiology results:   Thank you for allowing pharmacy to be a part of this patient's care.  Rocky Morel 10/10/2017 9:27 AM

## 2017-10-10 NOTE — ED Notes (Signed)
Patient transported to X-ray 

## 2017-10-10 NOTE — Consult Note (Addendum)
Hematology/Oncology Consult note [facility] [phone] [phone]  Patient Care Team: Alvester Morin, MD as PCP - General (Family Medicine)   Name of the patient: Timothy Juarez  [id number]  07-29-1951   Date of visit: 10/10/17 REASON FOR COSULTATION:  Symptomatic anemia, recurrent DVT.  Pertinent Hematology History Medical records from nursing home by patient's PCP Dr.Slade-Hartman, patient had been on Eliquis 5mg  BID for previous DVTs. His medical records showed he had acute DVT at the left common femoral vein on 11/26/2016, and he was placed on Eliquis 2.5mg  BID. Later when another venous doppler was obtained on 02/15/2017 due to persistent pain and swelling, doppler showed persist deep vein thrombosis involving proxima to distal femoral vein. Common femoral vein has normal compression. There was a note on the second doppler report on 03/03/2017 that patient was taking Eliquis 5mg  BID, which was stopped on 7/19 and patient was placed on Lovenox. Patient had swelling and pain of his left stump and symptoms does not improve with being on Lovenox. Xarelto 15mg  BID was started on 03/05/2017 and Lovenox was discontinued on 03/09/2017 with a plan to switch to Xarelto 20mg  daily after 21 days.  It was mentioned in nursing home note that patient had been on Warfarin previously and acquired blood clot while on warfarin. Patient does not remember this and the details of his?warfarin resistance is unclear.   3 His Lupus anticoagulant testing was positive due to prolonged DRVVT, although possibly falsely positive due to [place]. He was offered to be switched to Lovenox shots as alternative for treatment of possible lupus anticoagulant hypercoagulable state, patient prefers to stay on Xalreto 20mg  daily and has been doing well on that.   4 He had a history of iron deficiency anemia and was referred to GI for evaluation.  05/25/17- EGD -hiatal hernia,  diffuse candidiasis seen , dudoenal bx showed normal villu . Gastric bx showed erosive gastritis with intestinal metaplasia, negative for H pylori. Colonoscopy- polyps in transverse colon were tubulovillous with high grade dysplasia.  Ascending and descending colon polyps were tubular adenoma 06/2017 Small bowl capsule study.   History of presenting illness-  This is a 67 year old male with known history of chronic anemia recurrent DVT, peripheral arterial disease chronic congestive heart failure currently is admitted to the hospital due to symptomatic anemia, chest pain, fatigue.  Patient takes Xarelto for recurrent DVT and also he is on aspirin.  Patient was found to have a low hemoglobin at 5.3.  Xarelto and aspirin currently hold.  And he received PRBC transfusion.  Occult stool was positive for blood. Patient reports no chest pain currently, getting blood transfusion. Feels slightly better since admission. Denies no lower extremity pain or swelling.   Review of systems- Review of Systems  Constitutional: Positive for malaise/fatigue. Negative for chills and fever.  HENT: Negative for nosebleeds.   Eyes: Negative for pain.  Respiratory: Negative for cough, hemoptysis and sputum production.   Cardiovascular: Negative for chest pain, orthopnea, claudication and leg swelling.  Gastrointestinal: Negative for abdominal pain, nausea and vomiting.  Genitourinary: Negative for dysuria.  Musculoskeletal: Negative for myalgias.  Skin: Negative for rash.  Neurological: Positive for weakness. Negative for dizziness.  Endo/Heme/Allergies: Does not bruise/bleed easily.  Psychiatric/Behavioral: Negative for depression.    No Known Allergies  Patient Active Problem List   Diagnosis Date Noted  . Symptomatic anemia 10/10/2017  . Iron deficiency anemia 04/21/2017  . Hypertension 03/13/2017  . Recurrent deep vein thrombosis (DVT) ([place]) 03/11/2017  .  Hyperlipidemia 11/04/2016  . Pressure ulcer  04/11/2016  . Pseudoaneurysm of left femoral artery ([place]) 04/11/2016  . PAD (peripheral artery disease) ([place]) 04/10/2016  . Atherosclerosis of native arteries of extremities with gangrene, left leg ([place]) 11/01/2015  . Status post below knee amputation ([place]) 09/20/2015  . Ischemia of lower extremity 09/14/2015  . Atherosclerotic peripheral vascular disease with ulceration ([place]) 01/18/2015     Past Medical History:  Diagnosis Date  . Allergy   . Anemia   . ARF (acute respiratory failure) (H[facility])    H/O  . CHF (congestive heart failure) ([place])   . COPD (chronic obstructive pulmonary disease) ([place])   . Hypertension   . Hypokalemia   . Insomnia   . Muscle contracture    MUSCLE SPASMS  . Peripheral vascular disease ([place])   . Pressure ulcer      Past Surgical History:  Procedure Laterality Date  . AMPUTATION Left 09/19/2015   Procedure: AMPUTATION BELOW KNEE;  Surgeon: Algernon Huxley, MD;  Location: [facility] ORS;  Service: Vascular;  Laterality: Left;  . AMPUTATION Left 11/01/2015   Procedure: AMPUTATION ABOVE KNEE;  Surgeon: Algernon Huxley, MD;  Location: [facility] ORS;  Service: Vascular;  Laterality: Left;  . APPLICATION OF WOUND VAC Left 10/18/2015   Procedure: APPLICATION OF WOUND VAC;  Surgeon: Algernon Huxley, MD;  Location: [facility] ORS;  Service: Vascular;  Laterality: Left;  . COLONOSCOPY WITH PROPOFOL N/A 05/25/2017   Procedure: COLONOSCOPY WITH PROPOFOL;  Surgeon: Jonathon Bellows, MD;  Location: [facility] ENDOSCOPY;  Service: Gastroenterology;  Laterality: N/A;  . ESOPHAGOGASTRODUODENOSCOPY (EGD) WITH PROPOFOL N/A 05/25/2017   Procedure: ESOPHAGOGASTRODUODENOSCOPY (EGD) WITH PROPOFOL;  Surgeon: Jonathon Bellows, MD;  Location: [facility] ENDOSCOPY;  Service: Gastroenterology;  Laterality: N/A;  . GIVENS CAPSULE STUDY N/A 07/14/2017   Procedure: GIVENS CAPSULE STUDY;  Surgeon: Jonathon Bellows, MD;  Location[facility] ENDOSCOPY;  Service: Gastroenterology;  Laterality: N/A;  . PERIPHERAL VASCULAR CATHETERIZATION Left 01/18/2015    Procedure: Lower Extremity Angiography;  Surgeon: Algernon Huxley, MD;  Location: [employer] [facility] CV LAB;  Service: Cardiovascular;  Laterality: Left;  . PERIPHERAL VASCULAR CATHETERIZATION N/A 01/18/2015   Procedure: Lower Extremity Intervention;  Surgeon: Algernon Huxley, MD;  Location: [place] CV LAB;  Service: Cardiovascular;  Laterality: N/A;  . PERIPHERAL VASCULAR CATHETERIZATION  07/30/2015   Procedure: Lower Extremity Intervention;  Surgeon: Algernon Huxley, MD;  Location: [place] CV LAB;  Service: Cardiovascular;;  . PERIPHERAL VASCULAR CATHETERIZATION N/A 07/30/2015   Procedure: Abdominal Aortogram w/Lower Extremity;  Surgeon: Algernon Huxley, MD;  Location: [place] CV LAB;  Service: Cardiovascular;  Laterality: N/A;  . PERIPHERAL VASCULAR CATHETERIZATION Left 08/22/2015   Procedure: Lower Extremity Angiography;  Surgeon: Algernon Huxley, MD;  Location: [place] CV LAB;  Service: Cardiovascular;  Laterality: Left;  . PERIPHERAL VASCULAR CATHETERIZATION Left 08/22/2015   Procedure: Lower Extremity Intervention;  Surgeon: Algernon Huxley, MD;  Location: [place] CV LAB;  Service: Cardiovascular;  Laterality: Left;  . PERIPHERAL VASCULAR CATHETERIZATION Right 03/31/2016   Procedure: Lower Extremity Angiography;  Surgeon: Algernon Huxley, MD;  Location: [place] CV LAB;  Service: Cardiovascular;  Laterality: Right;  . PERIPHERAL VASCULAR CATHETERIZATION  03/31/2016   Procedure: Lower Extremity Intervention;  Surgeon: Algernon Huxley, MD;  Location: [place] CV LAB;  Service: Cardiovascular;;  . PERIPHERAL VASCULAR CATHETERIZATION Left 04/10/2016   Procedure: Lower Extremity Angiography;  Surgeon: Algernon Huxley, MD;  Location: [place] CV LAB;  Service: Cardiovascular;  Laterality:  Left;  . WOUND DEBRIDEMENT Left 10/18/2015   Procedure: DEBRIDEMENT WOUND   ( LEFT BKA DEBRIDEMENT );  Surgeon: Algernon Huxley, MD;  Location: [facility] ORS;  Service: Vascular;  Laterality: Left;    Social History    Socioeconomic History  . Marital status: Legally Separated    Spouse name: Not on file  . Number of children: Not on file  . Years of education: Not on file  . Highest education level: Not on file  Social Needs  . Financial resource strain: Not on file  . Food insecurity - worry: Not on file  . Food insecurity - inability: Not on file  . Transportation needs - medical: Not on file  . Transportation needs - non-medical: Not on file  Occupational History  . Not on file  Tobacco Use  . Smoking status: Former Smoker    Packs/day: 1.00    Years: 44.00    Pack years: 44.00    Types: Cigarettes    Last attempt to quit: 11/17/2012    Years since quitting: 4.8  . Smokeless tobacco: Never Used  Substance and Sexual Activity  . Alcohol use: No  . Drug use: No  . Sexual activity: No  Other Topics Concern  . Not on file  Social History Narrative  . Not on file     History reviewed. No pertinent family history.   Current Facility-Administered Medications:  .  acetaminophen (TYLENOL) tablet 650 mg, 650 mg, Oral, Q6H PRN **OR** acetaminophen (TYLENOL) suppository 650 mg, 650 mg, Rectal, Q6H PRN, Gouru, Aruna, MD .  albuterol (PROVENTIL) (2.5 MG/3ML) 0.083% nebulizer solution 2.5 mg, 2.5 mg, Nebulization, Q4H PRN, Gouru, Aruna, MD .  [COMPLETED] azithromycin (ZITHROMAX) tablet 500 mg, 500 mg, Oral, Daily, 500 mg at 10/10/17 1230 **FOLLOWED BY** [START ON 10/11/2017] azithromycin (ZITHROMAX) tablet 250 mg, 250 mg, Oral, Daily, Gouru, Aruna, MD .  fentaNYL (DURAGESIC - dosed mcg/hr) 12.5 mcg, 12.5 mcg, Transdermal, Q72H, Gouru, Aruna, MD .  mometasone-formoterol (DULERA) 100-5 MCG/ACT inhaler 2 puff, 2 puff, Inhalation, BID, Gouru, Aruna, MD, 2 puff at 10/10/17 1235 .  ondansetron (ZOFRAN) tablet 4 mg, 4 mg, Oral, Q6H PRN **OR** ondansetron (ZOFRAN) injection 4 mg, 4 mg, Intravenous, Q6H PRN, Gouru, Aruna, MD .  oxyCODONE (Oxy IR/ROXICODONE) immediate release tablet 10 mg, 10 mg, Oral, TID  PRN, Gouru, Aruna, MD .  pantoprazole (PROTONIX) injection 40 mg, 40 mg, Intravenous, Q12H, Gouru, Aruna, MD, 40 mg at 10/10/17 1231 .  tiotropium (SPIRIVA) inhalation capsule 18 mcg, 18 mcg, Inhalation, Daily, Gouru, Aruna, MD, 18 mcg at 10/10/17 1235   Physical exam:  Vitals:   10/10/17 1030 10/10/17 1423 10/10/17 1438 10/10/17 1440  BP: 137/60 (!) 130/51 129/60 129/60  Pulse: 90 90 88 88  Resp: 20 20 18 18   Temp: 98.7 F (37.1 C) 99.1 F (37.3 C) 98.8 F (37.1 C) 98.8 F (37.1 C)  TempSrc: Oral Oral Oral Oral  SpO2: 93% 96% 99% 99%  Weight:      Height:       GENERAL: No distress, well nourished.  SKIN:  No rashes or significant lesions  HEAD: Normocephalic, No masses, lesions, tenderness or abnormalities  EYES: Conjunctiva are pale, non icteric ENT: External ears normal ,lips , buccal mucosa, and tongue normal and mucous membranes are moist  LYMPH: No palpable cervical and axillary lymphadenopathy  LUNGS: Clear to auscultation, no crackles or wheezes HEART: Regular rate & rhythm, no murmurs, no gallops, S1 normal and S2 normal  ABDOMEN:  Abdomen soft, non-tender, normal bowel sounds, MUSCULOSKELETAL: No CVA tenderness and no tenderness on percussion of the back or rib cage.  EXTREMITIES: left above knee amputation stump non tender or swelling. No edema at right lower extremity.  NEURO: Alert & oriented, no focal motor/sensory deficits.       CMP Latest Ref Rng & Units 10/10/2017  Glucose 65 - 99 mg/dL 121(H)  BUN 6 - 20 mg/dL 21(H)  Creatinine 0.61 - 1.24 mg/dL 1.30(H)  Sodium 135 - 145 mmol/L 138  Potassium 3.5 - 5.1 mmol/L 3.8  Chloride 101 - 111 mmol/L 108  CO2 22 - 32 mmol/L 21(L)  Calcium 8.9 - 10.3 mg/dL 8.5(L)  Total Protein 6.5 - 8.1 g/dL -  Total Bilirubin 0.3 - 1.2 mg/dL -  Alkaline Phos 38 - 126 U/L -  AST 15 - 41 U/L -  ALT 17 - 63 U/L -   CBC Latest Ref Rng & Units 10/10/2017  WBC 3.8 - 10.6 K/uL 13.0(H)  Hemoglobin 13.0 - 18.0 g/dL 5.3(L)   Hematocrit 40.0 - 52.0 % 19.1(L)  Platelets 150 - 440 K/uL 422      Dg Chest 2 View  Result Date: 10/10/2017 CLINICAL DATA:  Chest pain. EXAM: CHEST  2 VIEW COMPARISON:  November 07, 2015 FINDINGS: Chronic changes in the left apex are stable. Reticular changes in the right upper lung are mildly more prominent the interval. No pneumothorax. The cardiomediastinal silhouette is stable. No other acute abnormalities. IMPRESSION: 1. Mild reticular changes in the right upper lung are more prominent since March 2017 but may have been present at that time. This may be a progressive chronic process. However, a subtle acute infiltrate is not excluded. 2. Chronic changes in the left apex. Electronically Signed   By: Dorise Bullion III M.D   On: 10/10/2017 07:34    Assessment and plan- Patient is a 67 y.o. male with history of recurrent DVT on Xarelto 20mg  daily, peripheral artery disease, history of iron deficiency anemia, gastritis, high grade dysplastic colon presented with chest pain, symptomatic anemia, positive stool occult.   # Symptomatic anemia: agree with PRBC transfusion. Keep hemoglogbin above 7.  # History of iron deficiency, check ferritin, iron panel.  # GI bleeding: GI recommends RBC scan or CT angiogram, and possible embolization if indicated.  # Recurrent DVT: he is at high risk of recurrent DVT, agree with hold anticoagulation given blood loss. Consult vascular surgeon for IVC filter placement.  # Chest pain: likely demand mismatch. Appreciate cardiology input.    Addendum: Iron panel reviewed consistent with iron deficiency, will give IV venofer x 1. I have discussed with patient and he is in agreement. .  Thank you for this kind referral and the opportunity to participate in the care of this patient  Earlie Server, MD, PhD Hematology [place] at [facility] Pager- [phone] 10/10/2017

## 2017-10-10 NOTE — ED Triage Notes (Signed)
Arrives [facility]-- from [facility].  Awoke this morning at 0630 with chest pain.  Pain curretnly resolved.  States pain has been going on for several weeks.  VS wnl per EMS.

## 2017-10-10 NOTE — ED Provider Notes (Signed)
[facility] Emergency Department Provider Note   ____________________________________________   First MD Initiated Contact with Patient 10/10/17 0719     (approximate)  I have reviewed the triage vital signs and the nursing notes.   HISTORY  Chief Complaint Chest Pain    HPI Timothy Juarez is a 67 y.o. male been having intermittent chest pain for 2 weeks.  Patient reports his roommate died 2 weeks ago, since about that time is been noticing the has intermittent feeling of discomfort that is very hard to describe in his chest.  He cannot really describe it well.  It is not radiating it is not a heavy pain.  Denies sharp pain.  Is not short of breath with it.  Is also had increased fatigue.  Feeling tired.  Also reports he has had some blood in his stool that they have been checking over for the last few months, but he has not seen any obvious blood in his stool or diarrhea but has been told it is present.     Past Medical History:  Diagnosis Date  . Allergy   . Anemia   . ARF (acute respiratory failure) (HCC)    H/O  . CHF (congestive heart failure) ([place])   . COPD (chronic obstructive pulmonary disease) ([place])   . Hypertension   . Hypokalemia   . Insomnia   . Muscle contracture    MUSCLE SPASMS  . Peripheral vascular disease ([place])   . Pressure ulcer     Patient Active Problem List   Diagnosis Date Noted  . Iron deficiency anemia 04/21/2017  . Hypertension 03/13/2017  . Recurrent deep vein thrombosis (DVT) ([place]) 03/11/2017  . Hyperlipidemia 11/04/2016  . Pressure ulcer 04/11/2016  . Pseudoaneurysm of left femoral artery ([place]) 04/11/2016  . PAD (peripheral artery disease) ([place]) 04/10/2016  . Atherosclerosis of native arteries of extremities with gangrene, left leg ([place]) 11/01/2015  . Status post below knee amputation ([place]) 09/20/2015  . Ischemia of lower extremity 09/14/2015  . Atherosclerotic peripheral vascular disease with ulceration  ([place]) 01/18/2015    Past Surgical History:  Procedure Laterality Date  . AMPUTATION Left 09/19/2015   Procedure: AMPUTATION BELOW KNEE;  Surgeon: Algernon Huxley, MD;  Location: [facility] ORS;  Service: Vascular;  Laterality: Left;  . AMPUTATION Left 11/01/2015   Procedure: AMPUTATION ABOVE KNEE;  Surgeon: Algernon Huxley, MD;  Location: [facility] ORS;  Service: Vascular;  Laterality: Left;  . APPLICATION OF WOUND VAC Left 10/18/2015   Procedure: APPLICATION OF WOUND VAC;  Surgeon: Algernon Huxley, MD;  Location: [facility] ORS;  Service: Vascular;  Laterality: Left;  . COLONOSCOPY WITH PROPOFOL N/A 05/25/2017   Procedure: COLONOSCOPY WITH PROPOFOL;  Surgeon: Jonathon Bellows, MD;  Location: [facility] ENDOSCOPY;  Service: Gastroenterology;  Laterality: N/A;  . ESOPHAGOGASTRODUODENOSCOPY (EGD) WITH PROPOFOL N/A 05/25/2017   Procedure: ESOPHAGOGASTRODUODENOSCOPY (EGD) WITH PROPOFOL;  Surgeon: Jonathon Bellows, MD;  Location: [facility] ENDOSCOPY;  Service: Gastroenterology;  Laterality: N/A;  . GIVENS CAPSULE STUDY N/A 07/14/2017   Procedure: GIVENS CAPSULE STUDY;  Surgeon: Jonathon Bellows, MD;  Location: [facility] ENDOSCOPY;  Service: Gastroenterology;  Laterality: N/A;  . PERIPHERAL VASCULAR CATHETERIZATION Left 01/18/2015   Procedure: Lower Extremity Angiography;  Surgeon: Algernon Huxley, MD;  Location: [place] CV LAB;  Service: Cardiovascular;  Laterality: Left;  . PERIPHERAL VASCULAR CATHETERIZATION N/A 01/18/2015   Procedure: Lower Extremity Intervention;  Surgeon: Algernon Huxley, MD;  Location: [place] [facility] LAB;  Service: Cardiovascular;  Laterality: N/A;  .  PERIPHERAL VASCULAR CATHETERIZATION  07/30/2015   Procedure: Lower Extremity Intervention;  Surgeon: Algernon Huxley, MD;  Location: [place] CV LAB;  Service: Cardiovascular;;  . PERIPHERAL VASCULAR CATHETERIZATION N/A 07/30/2015   Procedure: Abdominal Aortogram w/Lower Extremity;  Surgeon: Algernon Huxley, MD;  Location: [place] [facility] LAB;  Service: Cardiovascular;  Laterality: N/A;  .  PERIPHERAL VASCULAR CATHETERIZATION Left 08/22/2015   Procedure: Lower Extremity Angiography;  Surgeon: Algernon Huxley, MD;  Location: [place] [facility] LAB;  Service: Cardiovascular;  Laterality: Left;  . PERIPHERAL VASCULAR CATHETERIZATION Left 08/22/2015   Procedure: Lower Extremity Intervention;  Surgeon: Algernon Huxley, MD;  Location: [place] CV LAB;  Service: Cardiovascular;  Laterality: Left;  . PERIPHERAL VASCULAR CATHETERIZATION Right 03/31/2016   Procedure: Lower Extremity Angiography;  Surgeon: Algernon Huxley, MD;  Location: [place] [facility] LAB;  Service: Cardiovascular;  Laterality: Right;  . PERIPHERAL VASCULAR CATHETERIZATION  03/31/2016   Procedure: Lower Extremity Intervention;  Surgeon: Algernon Huxley, MD;  Location: [place] CV LAB;  Service: Cardiovascular;;  . PERIPHERAL VASCULAR CATHETERIZATION Left 04/10/2016   Procedure: Lower Extremity Angiography;  Surgeon: Algernon Huxley, MD;  Location: [place] [facility] LAB;  Service: Cardiovascular;  Laterality: Left;  . WOUND DEBRIDEMENT Left 10/18/2015   Procedure: DEBRIDEMENT WOUND   ( LEFT BKA DEBRIDEMENT );  Surgeon: Algernon Huxley, MD;  Location: [facility] ORS;  Service: Vascular;  Laterality: Left;    Prior to Admission medications   Medication Sig Start Date End Date Taking? Authorizing Provider  acetaminophen (TYLENOL) 325 MG tablet Take 650 mg by mouth at bedtime.     [provider]  allopurinol (ZYLOPRIM) 100 MG tablet  03/19/17   [provider]  aspirin EC 81 MG EC tablet Take 1 tablet (81 mg total) by mouth daily. 09/24/15   Algernon Huxley, MD  baclofen (LIORESAL) 10 MG tablet Take 10 mg by mouth 3 (three) times daily. Hold for sedation    [provider]  budesonide-formoterol (SYMBICORT) 80-4.5 MCG/ACT inhaler Inhale 2 puffs into the lungs 2 (two) times daily.    [provider]  enoxaparin (LOVENOX) 100 MG/ML injection  05/23/17   [provider]  fentaNYL (DURAGESIC - DOSED MCG/HR) 12 MCG/HR Place  12 mcg onto the skin every 3 (three) days.  10/28/16   [provider]  furosemide (LASIX) 40 MG tablet Take 40 mg by mouth 2 (two) times daily.     [provider]  gabapentin (NEURONTIN) 100 MG capsule Take 100 mg by mouth at bedtime.    [provider]  Melatonin 5 MG CAPS Take 1 capsule by mouth at bedtime as needed.    [provider]  metoprolol tartrate (LOPRESSOR) 25 MG tablet Take 0.5 tablets (12.5 mg total) by mouth 2 (two) times daily. 09/24/15   Algernon Huxley, MD  Omega-3 Fatty Acids (FISH OIL) 1000 MG CAPS Take 1,000 mg by mouth every morning.    [provider]  Oxycodone HCl 10 MG TABS Take 10 mg by mouth 3 (three) times daily.    [provider]  potassium chloride (K-DUR) 10 MEQ tablet Take 20 mEq by mouth daily.     [provider]  tiotropium (SPIRIVA) 18 MCG inhalation capsule Place 18 mcg into inhaler and inhale daily.    [provider]  XARELTO 20 MG TABS tablet  04/16/17   [provider]    Allergies Patient has no known allergies.  No family  history on file.  Social History Social History   Tobacco Use  . Smoking status: Former Smoker    Packs/day: 1.00    Years: 44.00    Pack years: 44.00    Types: Cigarettes    Last attempt to quit: 11/17/2012    Years since quitting: 4.8  . Smokeless tobacco: Never Used  Substance Use Topics  . Alcohol use: No  . Drug use: No    Review of Systems Constitutional: No fever/chills Eyes: No visual changes. ENT: No sore throat. Cardiovascular: See HPI, very vague and hard to describe.  Currently denies chest pain present Respiratory: Denies shortness of breath. Gastrointestinal: No abdominal pain.  No nausea, no vomiting.  No diarrhea.  No constipation. Genitourinary: Negative for dysuria. Musculoskeletal: Negative for back pain. Skin: Negative for rash. Neurological: Negative for headaches, focal weakness or  numbness.    ____________________________________________   PHYSICAL EXAM:  VITAL SIGNS: ED Triage Vitals  Enc Vitals Group     BP 10/10/17 0714 (!) 126/54     Pulse Rate 10/10/17 0714 93     Resp 10/10/17 0714 18     Temp 10/10/17 0714 98.4 F (36.9 C)     Temp Source 10/10/17 0714 Oral     SpO2 10/10/17 0714 99 %     Weight 10/10/17 0709 202 lb (91.6 kg)     Height 10/10/17 0709 6\' 1"  (1.854 m)     Head Circumference --      Peak Flow --      Pain Score --      Pain Loc --      Pain Edu? --      Excl. in [place]? --     Constitutional: Alert and oriented. Well appearing likely chronically ill and in no acute distress. Eyes: Conjunctivae are slightly icteric. Head: Atraumatic. Nose: No congestion/rhinnorhea. Mouth/Throat: Mucous membranes are moist. Neck: No stridor.   Cardiovascular: Normal rate, regular rhythm. Grossly normal heart sounds.  Good peripheral circulation. Respiratory: Normal respiratory effort.  No retractions. Lungs CTAB. Gastrointestinal: Soft and nontender. No distention. Musculoskeletal: Chronic venous stasis changes right lower extremity.  Left above-the-knee amputation.. Neurologic:  Normal speech and language. No gross focal neurologic deficits are appreciated.  Skin:  Skin is warm, dry and intact. No rash noted. Psychiatric: Mood and affect are normal. Speech and behavior are normal.  ____________________________________________   LABS (all labs ordered are listed, but only abnormal results are displayed)  Labs Reviewed  BASIC METABOLIC PANEL - Abnormal; Notable for the following components:      Result Value   CO2 21 (*)    Glucose, Bld 121 (*)    BUN 21 (*)    Creatinine, Ser 1.30 (*)    Calcium 8.5 (*)    GFR calc non Af Amer 56 (*)    All other components within normal limits  CBC - Abnormal; Notable for the following components:   WBC 13.0 (*)    RBC 3.52 (*)    Hemoglobin 5.3 (*)    HCT 19.1 (*)    MCV 54.2 (*)    MCH 15.1 (*)     MCHC 28.0 (*)    RDW 23.0 (*)    All other components within normal limits  TROPONIN I - Abnormal; Notable for the following components:   Troponin I 0.03 (*)    All other components within normal limits  APTT  PROTIME-INR  PREPARE RBC (CROSSMATCH)  TYPE AND SCREEN   ____________________________________________  EKG  Reviewed and are by me at 7:15 AM Heart rate 90 QRS 99 QTC 440 Normal sinus rhythm, no evidence of ischemia denoted ____________________________________________  RADIOLOGY    Chest x-ray reviewed, reticular changes noted by radiology.  Progression of chronic process. ____________________________________________   PROCEDURES  Procedure(s) performed: None  Procedures  Critical Care performed: Yes, see critical care note(s)  CRITICAL CARE Performed by: Delman Kitten   Total critical care time: 35 minutes  Critical care time was exclusive of separately billable procedures and treating other patients.  Critical care was necessary to treat or prevent imminent or life-threatening deterioration.  Critical care was time spent personally by me on the following activities: development of treatment plan with patient and/or surrogate as well as nursing, discussions with consultants, evaluation of patient's response to treatment, examination of patient, obtaining history from patient or surrogate, ordering and performing treatments and interventions, ordering and review of laboratory studies, ordering and review of radiographic studies, pulse oximetry and re-evaluation of patient's condition.  Anemia requiring transfusion.  ____________________________________________   INITIAL IMPRESSION / ASSESSMENT AND PLAN / ED COURSE  Pertinent labs & imaging results that were available during my care of the patient were reviewed by me and considered in my medical decision making (see chart for details).  Patient with hard to describe chest discomfort.  EKG and first  troponin reassuring, however noted to have notable anemia that appears to be somewhat acute on chronic.  He is guaiac positive, occult blood detected, though stool is brown and well formed.  There is no obvious large amount of GI bleeding at this time, but I suspect a slow worsening of bleeding.  Been worked up by Dr. Vicente Males in the past, please see his consultation notes from prior.  I suspect his symptoms are related to his anemia.  Certainly would not recommend giving any further aspirin at this present time, as there is no clear indication of cardiac chest discomfort.  Discussed with the hospitalist, will admit the patient for further evaluation.  I personally reviewed blood consent with the patient, he is verbally consentable to obtaining blood product and nurse, Opal Sidles, will obtain written consent prior to transfusion.  2 units ordered.      ____________________________________________   FINAL CLINICAL IMPRESSION(S) / ED DIAGNOSES  Final diagnoses:  Symptomatic anemia  Gastrointestinal hemorrhage, unspecified gastrointestinal hemorrhage type      NEW MEDICATIONS STARTED DURING THIS VISIT:  New Prescriptions   No medications on file     Note:  This document was prepared using Dragon voice recognition software and may include unintentional dictation errors.     Delman Kitten, MD 10/10/17 [phone]

## 2017-10-10 NOTE — ED Notes (Signed)
AAOx3.  Skin warm and dry.  NAD 

## 2017-10-11 ENCOUNTER — Inpatient Hospital Stay: Payer: Medicare Other

## 2017-10-11 ENCOUNTER — Inpatient Hospital Stay (HOSPITAL_COMMUNITY)
Admit: 2017-10-11 | Discharge: 2017-10-11 | Disposition: A | Discharge status: Home / Self Care | Payer: Medicare Other | Attending: Internal Medicine | Admitting: Internal Medicine

## 2017-10-11 ENCOUNTER — Other Ambulatory Visit: Payer: Self-pay

## 2017-10-11 DIAGNOSIS — K921 Melena: Secondary | ICD-10-CM | POA: Diagnosis not present

## 2017-10-11 DIAGNOSIS — R079 Chest pain, unspecified: Secondary | ICD-10-CM | POA: Diagnosis not present

## 2017-10-11 DIAGNOSIS — I361 Nonrheumatic tricuspid (valve) insufficiency: Secondary | ICD-10-CM

## 2017-10-11 DIAGNOSIS — D649 Anemia, unspecified: Secondary | ICD-10-CM | POA: Diagnosis not present

## 2017-10-11 DIAGNOSIS — J189 Pneumonia, unspecified organism: Secondary | ICD-10-CM | POA: Diagnosis not present

## 2017-10-11 DIAGNOSIS — K2970 Gastritis, unspecified, without bleeding: Secondary | ICD-10-CM | POA: Diagnosis not present

## 2017-10-11 DIAGNOSIS — K922 Gastrointestinal hemorrhage, unspecified: Secondary | ICD-10-CM | POA: Diagnosis not present

## 2017-10-11 DIAGNOSIS — R748 Abnormal levels of other serum enzymes: Secondary | ICD-10-CM

## 2017-10-11 DIAGNOSIS — I82409 Acute embolism and thrombosis of unspecified deep veins of unspecified lower extremity: Secondary | ICD-10-CM | POA: Diagnosis not present

## 2017-10-11 DIAGNOSIS — D509 Iron deficiency anemia, unspecified: Secondary | ICD-10-CM

## 2017-10-11 DIAGNOSIS — D6862 Lupus anticoagulant syndrome: Secondary | ICD-10-CM | POA: Diagnosis not present

## 2017-10-11 DIAGNOSIS — I5032 Chronic diastolic (congestive) heart failure: Secondary | ICD-10-CM | POA: Diagnosis not present

## 2017-10-11 DIAGNOSIS — I248 Other forms of acute ischemic heart disease: Secondary | ICD-10-CM | POA: Diagnosis not present

## 2017-10-11 DIAGNOSIS — I82512 Chronic embolism and thrombosis of left femoral vein: Secondary | ICD-10-CM | POA: Diagnosis not present

## 2017-10-11 DIAGNOSIS — Z7951 Long term (current) use of inhaled steroids: Secondary | ICD-10-CM | POA: Diagnosis not present

## 2017-10-11 DIAGNOSIS — D62 Acute posthemorrhagic anemia: Secondary | ICD-10-CM | POA: Diagnosis not present

## 2017-10-11 LAB — ECHOCARDIOGRAM COMPLETE
CHL CUP MV DEC (S): 218
E/e' ratio: 9.86
EWDT: 218 ms
FS: 47 % — AB (ref 28–44)
Height: 73 in
IV/PV OW: 1.06
LA vol: 58.6 mL
LADIAMINDEX: 1.82 cm/m2
LASIZE: 39 mm
LAVOLA4C: 51.1 mL
LAVOLIN: 27.4 mL/m2
LEFT ATRIUM END SYS DIAM: 39 mm
LV E/e' medial: 9.86
LV TDI E'LATERAL: 7.94
LVEEAVG: 9.86
LVELAT: 7.94 cm/s
MV pk A vel: 116 m/s
MV pk E vel: 78.3 m/s
MVAP: 3.44 cm2
MVPG: 2 mmHg
MVSPHT: 64 ms
PW: 8.77 mm — AB (ref 0.6–1.1)
RV LATERAL S' VELOCITY: 15.4 cm/s
TAPSE: 36 mm
TDI e' medial: 5.33
Weight: 3108.8 oz

## 2017-10-11 LAB — COMPREHENSIVE METABOLIC PANEL
ALBUMIN: 3.4 g/dL — AB (ref 3.5–5.0)
ALT: 8 U/L — ABNORMAL LOW (ref 17–63)
ANION GAP: 7 (ref 5–15)
AST: 16 U/L (ref 15–41)
Alkaline Phosphatase: 89 U/L (ref 38–126)
BUN: 13 mg/dL (ref 6–20)
CO2: 23 mmol/L (ref 22–32)
Calcium: 8.2 mg/dL — ABNORMAL LOW (ref 8.9–10.3)
Chloride: 110 mmol/L (ref 101–111)
Creatinine, Ser: 0.88 mg/dL (ref 0.61–1.24)
GFR calc non Af Amer: >60 mL/min (ref >60)
Glucose, Bld: 101 mg/dL — ABNORMAL HIGH (ref 65–99)
Potassium: 3.4 mmol/L — ABNORMAL LOW (ref 3.5–5.1)
SODIUM: 140 mmol/L (ref 135–145)
Total Bilirubin: 2 mg/dL — ABNORMAL HIGH (ref 0.3–1.2)
Total Protein: 6.9 g/dL (ref 6.5–8.1)

## 2017-10-11 LAB — BPAM RBC
BLOOD PRODUCT EXPIRATION DATE: 201903202359
Blood Product Expiration Date: 201903202359
ISSUE DATE / TIME: 201902230959
ISSUE DATE / TIME: 201902231443
Unit Type and Rh: 6200
Unit Type and Rh: 6200

## 2017-10-11 LAB — CBC
HCT: 25.5 % — ABNORMAL LOW (ref 40.0–52.0)
Hemoglobin: 7.8 g/dL — ABNORMAL LOW (ref 13.0–18.0)
MCH: 19 pg — AB (ref 26.0–34.0)
MCHC: 30.7 g/dL — ABNORMAL LOW (ref 32.0–36.0)
MCV: 61.8 fL — ABNORMAL LOW (ref 80.0–100.0)
PLATELETS: 320 10*3/uL (ref 150–440)
RBC: 4.12 MIL/uL — AB (ref 4.40–5.90)
RDW: 33.9 % — ABNORMAL HIGH (ref 11.5–14.5)
WBC: 13.7 10*3/uL — ABNORMAL HIGH (ref 3.8–10.6)

## 2017-10-11 LAB — TROPONIN I
TROPONIN I: 0.44 ng/mL — AB (ref <0.03)
TROPONIN I: 0.31 ng/mL — AB (ref <0.03)
TROPONIN I: 0.25 ng/mL — AB (ref <0.03)

## 2017-10-11 LAB — TYPE AND SCREEN
ABO/RH(D): A POS
ANTIBODY SCREEN: NEGATIVE
UNIT DIVISION: 0
UNIT DIVISION: 0

## 2017-10-11 LAB — HEMOGLOBIN AND HEMATOCRIT, BLOOD
HCT: 26.4 % — ABNORMAL LOW (ref 40.0–52.0)
Hemoglobin: 8 g/dL — ABNORMAL LOW (ref 13.0–18.0)

## 2017-10-11 MED ORDER — IOPAMIDOL (ISOVUE-370) INJECTION 76%
100.0000 mL | Freq: Once | INTRAVENOUS | Status: AC | PRN
  Administered 2017-10-11: 100 mL via INTRAVENOUS

## 2017-10-11 MED ORDER — POTASSIUM CHLORIDE CRYS ER 20 MEQ PO TBCR
40.0000 meq | EXTENDED_RELEASE_TABLET | ORAL | Status: AC
  Administered 2017-10-11 (×2): 40 meq via ORAL
  Filled 2017-10-11 (×2): qty 2

## 2017-10-11 NOTE — Plan of Care (Signed)
No signs bleed noted this shift.

## 2017-10-11 NOTE — Progress Notes (Signed)
Vonda Antigua, MD [address] [place], [place], [place], [address] [address], [place], [place], [place], [address] Phone: [phone]  Fax: [phone]   Subjective: Patient has not had any further episodes of GI bleeding.  Denies any abdominal pain, no nausea vomiting.  Denies any chest pain at this time.   Objective: Vital signs in last 24 hours: Vitals:   10/10/17 1751 10/10/17 1752 10/10/17 2000 10/11/17 0349  BP: (!) 129/55 (!) 129/55 (!) 126/52 (!) 128/49  Pulse: 86 86 85 74  Resp: 20 20 19 17   Temp: 99.2 F (37.3 C) 99.2 F (37.3 C) 99.5 F (37.5 C) 98.2 F (36.8 C)  TempSrc: Oral Oral Oral Oral  SpO2: 100% 100% 98% 94%  Weight:    194 lb 4.8 oz (88.1 kg)  Height:       Weight change:   Intake/Output Summary (Last 24 hours) at 10/11/2017 6378 Last data filed at 10/11/2017 5885 Gross per 24 hour  Intake 1106.67 ml  Output 1100 ml  Net 6.67 ml     Exam: Pulmonary: CTA bilaterally, normal respiratory effort Abd: Soft, NT/ND, No HSM Skin: Warm, no rashes Neck: Supple, Trachea midline   Lab Results: Lab Results  Component Value Date   WBC 13.7 (H) 10/11/2017   HGB 7.8 (L) 10/11/2017   HCT 25.5 (L) 10/11/2017   MCV 61.8 (L) 10/11/2017   PLT 320 10/11/2017   Micro Results: Recent Results (from the past 240 hour(s))  MRSA PCR Screening     Status: None   Collection Time: 10/10/17 10:05 AM  Result Value Ref Range Status   MRSA by PCR NEGATIVE NEGATIVE Final    Comment:        The GeneXpert MRSA Assay (FDA approved for NASAL specimens only), is one component of a comprehensive MRSA colonization surveillance program. It is not intended to diagnose MRSA infection nor to guide or monitor treatment for MRSA infections. Performed at [facility], [address], [place], [place] [address]    Studies/Results: Dg Chest 2 View  Result Date: 10/10/2017 CLINICAL DATA:  Chest pain. EXAM: CHEST  2 VIEW COMPARISON:  November 07, 2015  FINDINGS: Chronic changes in the left apex are stable. Reticular changes in the right upper lung are mildly more prominent the interval. No pneumothorax. The cardiomediastinal silhouette is stable. No other acute abnormalities. IMPRESSION: 1. Mild reticular changes in the right upper lung are more prominent since March 2017 but may have been present at that time. This may be a progressive chronic process. However, a subtle acute infiltrate is not excluded. 2. Chronic changes in the left apex. Electronically Signed   By: Dorise Bullion III M.D   On: 10/10/2017 07:34   Medications:  Scheduled Meds: . azithromycin  250 mg Oral Daily  . fentaNYL  12.5 mcg Transdermal Q72H  . mometasone-formoterol  2 puff Inhalation BID  . pantoprazole (PROTONIX) IV  40 mg Intravenous Q12H  . potassium chloride  40 mEq Oral Q4H  . sodium chloride flush  3 mL Intravenous Q12H  . tiotropium  18 mcg Inhalation Daily   Continuous Infusions: . iron sucrose     PRN Meds:.acetaminophen **OR** acetaminophen, albuterol, ondansetron **OR** ondansetron (ZOFRAN) IV, oxyCODONE, sodium chloride flush   Assessment: Active Problems:   Symptomatic anemia   Gastrointestinal hemorrhage   Chronic deep vein thrombosis (DVT) of left lower extremity ([place])  67 year old male presents with chest pain, and chest pain has resolved at this time Acute on  chronic anemia with no signs of active GI bleeding at this time, with recent EGD and colonoscopy in October 2018 showing no evidence of bleeding, but reporting candidiasis, gastritis, and small bowel capsule showing hematin at 1 hour 2-minute mark into the study, with Xarelto use at home  Plan: Chest pain has resolved at this time  patient's troponin is noted to be elevated, peaked at 0.56 Patient will need cardiology consult and cardiology clearance prior to any endoscopy.  Would recommend cardiology consult for the same.   Endoscopy requires sedation, which carries risk of  bradycardia, hemodynamic changes, and cardiac events in the setting of elevated troponin Xarelto will need to be held for 3-5 days prior to the procedure Continue serial CBCs and transfuse PRN PPI IV twice daily  Hematin seen in the small bowel capsule, could represent possible bleeding for AVM in the small bowel, versus nonspecific finding Given that he is high risk for endoscopy this time, and Xarelto was used within the last 48 hours, If patient has signs of active GI bleeding, would recommend RBC scan versus CTA depending on how much bleeding he is having at the time.    If he is cleared from cardiology perspective, can consider push enteroscopy in 3-5 days after holding Xarelto, if etiology of bleeding has not been revealed by that time   LOS: 1 day   Vonda Antigua, MD 10/11/2017, 8:08 AM

## 2017-10-11 NOTE — Progress Notes (Signed)
[facility] [place] NAME: Timothy Juarez    MR#:  [id number]  DATE OF BIRTH:  1951-07-25  SUBJECTIVE:  CHIEF COMPLAINT: Is resting comfortably.  Denies any chest pain or abdominal pain.  No other episodes of bleeding he reports  REVIEW OF SYSTEMS:  CONSTITUTIONAL: No fever, fatigue or weakness.  EYES: No blurred or double vision.  EARS, NOSE, AND THROAT: No tinnitus or ear pain.  RESPIRATORY: No cough, shortness of breath, wheezing or hemoptysis.  CARDIOVASCULAR: No chest pain, orthopnea, edema.  GASTROINTESTINAL: No nausea, vomiting, diarrhea or abdominal pain.  GENITOURINARY: No dysuria, hematuria.  ENDOCRINE: No polyuria, nocturia,  HEMATOLOGY: No anemia, easy bruising or bleeding SKIN: No rash or lesion. MUSCULOSKELETAL: No joint pain or arthritis.   NEUROLOGIC: No tingling, numbness, weakness.  PSYCHIATRY: No anxiety or depression.   DRUG ALLERGIES:  No Known Allergies  VITALS:  Blood pressure 138/65, pulse 76, temperature 98.2 F (36.8 C), temperature source Oral, resp. rate 18, height 6\' 1"  (1.854 m), weight 88.1 kg (194 lb 4.8 oz), SpO2 99 %.  PHYSICAL EXAMINATION:  GENERAL:  67 y.o.-year-old patient lying in the bed with no acute distress.  EYES: Pupils equal, round, reactive to light and accommodation. No scleral icterus. Extraocular muscles intact.  HEENT: Head atraumatic, normocephalic. Oropharynx and nasopharynx clear.  NECK:  Supple, no jugular venous distention. No thyroid enlargement, no tenderness.  LUNGS: Normal breath sounds bilaterally, no wheezing, rales,rhonchi or crepitation. No use of accessory muscles of respiration.  CARDIOVASCULAR: S1, S2 normal. No murmurs, rubs, or gallops.  ABDOMEN: Soft, nontender, nondistended. Bowel sounds present. No organomegaly or mass.  EXTREMITIES: No pedal edema, cyanosis, or clubbing.  NEUROLOGIC: Cranial nerves II through XII are intact. Muscle strength 5/5 in all  extremities. Sensation intact. Gait not checked.  PSYCHIATRIC: The patient is alert and oriented x 3.  SKIN: No obvious rash, lesion, or ulcer.    LABORATORY PANEL:   CBC Recent Labs  Lab 10/11/17 0338  WBC 13.7*  HGB 7.8*  HCT 25.5*  PLT 320   ------------------------------------------------------------------------------------------------------------------  Chemistries  Recent Labs  Lab 10/11/17 0338  NA 140  K 3.4*  CL 110  CO2 23  GLUCOSE 101*  BUN 13  CREATININE 0.88  CALCIUM 8.2*  AST 16  ALT 8*  ALKPHOS 89  BILITOT 2.0*   ------------------------------------------------------------------------------------------------------------------  Cardiac Enzymes Recent Labs  Lab 10/11/17 1028  TROPONINI 0.31*   ------------------------------------------------------------------------------------------------------------------  RADIOLOGY:  Dg Chest 2 View  Result Date: 10/10/2017 CLINICAL DATA:  Chest pain. EXAM: CHEST  2 VIEW COMPARISON:  November 07, 2015 FINDINGS: Chronic changes in the left apex are stable. Reticular changes in the right upper lung are mildly more prominent the interval. No pneumothorax. The cardiomediastinal silhouette is stable. No other acute abnormalities. IMPRESSION: 1. Mild reticular changes in the right upper lung are more prominent since March 2017 but may have been present at that time. This may be a progressive chronic process. However, a subtle acute infiltrate is not excluded. 2. Chronic changes in the left apex. Electronically Signed   By: Dorise Bullion III M.D   On: 10/10/2017 07:34   Ct Angio Abd/pel W/ And/or W/o  Result Date: 10/11/2017 CLINICAL DATA:  Anemia EXAM: CTA ABDOMEN AND PELVIS wITHOUT AND WITH CONTRAST TECHNIQUE: Multidetector CT imaging of the abdomen and pelvis was performed using the standard protocol during bolus administration of intravenous contrast. Multiplanar reconstructed images and MIPs were obtained and reviewed  to  evaluate the vascular anatomy. CONTRAST:  143mL ISOVUE-370 IOPAMIDOL (ISOVUE-370) INJECTION 76% COMPARISON:  04/10/2016 FINDINGS: VASCULAR Aorta: There is soft smooth plaque throughout the abdominal aorta. Mild atherosclerotic calcifications. No luminal narrowing. No evidence of aneurysm. Celiac: Patent. Atherosclerotic calcifications. Branch vessels patent. SMA: Origin is patent. There is smooth plaque just beyond the origin of the SMA causing mild narrowing. Branch vessels grossly patent. Renals: Single bilateral renal arteries are patent with smooth soft plaque and some atherosclerotic calcification. IMA: Diminutive and patent. Inflow: There is soft and calcified plaque in the right common iliac artery without significant narrowing. There is mild narrowing involving the proximal right external iliac artery. There is significant narrowing of the distal right external iliac artery. See table position -1 142.6. Right internal iliac artery is moderately diseased. Left common iliac artery is patent with atherosclerotic changes. There is a stent in the left external iliac artery. The stent is patent. There is significant. This is best appreciated on coronal reformat images. See image 57 of series 10. Left internal iliac artery is moderately diseased. Narrowing just beyond the stent Proximal Outflow: Right common femoral and visualized superficial femoral arteries are grossly patent. Left common femoral artery is grossly patent. Left superficial femoral artery is occluded. Veins: No obvious DVT. Review of the MIP images confirms the above findings. NON-VASCULAR Lower chest: There is consolidation in the posterior basal left lower lobe. Hepatobiliary: Mild diffuse hepatic steatosis. Gallbladder is unremarkable. Pancreas: Unremarkable Spleen: Unremarkable Adrenals/Urinary Tract: Kidneys and adrenal glands are within normal limits. Mild diffuse bladder wall thickening. Stomach/Bowel: There is no convincing  diverticulosis scattered throughout the length of the colon is present. There is wall thickening of the rectum as well as stranding in the adjacent fat. An inflammatory process is not excluded. There is no evidence of small-bowel obstruction. Normal appendix. Stomach is decompressed. Small hiatal hernia is suspected. Duodenal C-loop is unremarkable. Focal contrast extravasation throughout the length of the colon to suggest active gastrointestinal bleeding. Lymphatic: No abnormal retroperitoneal adenopathy. Reproductive: Prostate is within normal limits. Other: There is no free fluid. The large hematoma in the proximal left thigh has largely resolved with some residual soft tissue density. Musculoskeletal: There is no vertebral compression deformity. IMPRESSION: VASCULAR Significant narrowing of the distal right external iliac artery. Significant narrowing of the distal left external iliac artery, beyond the stent. Left superficial femoral artery occlusion. Large hematoma in the left inguinal region and proximal left thigh has largely resolved with some residual chronic soft tissue density. NON-VASCULAR Patchy consolidation in the left lower lobe. Findings may represent pneumonia. Mild diffuse bladder wall thickening. Correlation with urinalysis is recommended. There is no convincing contrast extravasation within the colon to suggest active gastrointestinal bleeding. Wall thickening of the rectum with adjacent fatty stranding. Findings may represent an inflammatory process and could result in blood in stool. Correlate clinically. Electronically Signed   By: Marybelle Killings M.D.   On: 10/11/2017 10:56    EKG:   Orders placed or performed during the hospital encounter of 10/10/17  . ED EKG within 10 minutes  . ED EKG within 10 minutes  . EKG 12-Lead  . EKG 12-Lead    ASSESSMENT AND PLAN:    #Symptomatic anemia-acute on chronic Hemodynamically stable at this time Received 2 units of blood transfusion on  10/10/2017 Hemoglobin 5.3-8.2-7.8 Monitor hemoglobin and hematocrit, transfuse as needed Discontinuing Xarelto and aspirin at this time Protonix CT angiogram of the abdomen and pelvis with narrowing of the bilateral external iliac arteries and  resolving hematoma in the left pelvis, rectal wall thickening which could cause blood in the stool Appreciate GI recommendations might consider endoscopy after holding Xarelto at least for 48 hours Patient recently had a EGD, capsule study by gastroenterology Stool guaiac was positive in the emergency department  #History of recurrent DVTs Hold Xarelto and aspirin at this time Oncology is following As patient is at high risk for recurrent DVTs vascular surgery is consulted for possible IVC filter placement  #Chest pain could be demand ischemia Monitor patient on telemetry, patient is a symptomatic during my examination Cycle cardiac biomarkers 0.03-4.56-0.44 echocardiogram Cardiology recommending no interventions at this time as it is a demand ischemia   #Community acquired pneumonia Chest x-ray with possible infiltrate and patient is reporting chest pain which could be pleuritic Rocephin and azithromycin and sputum culture and sensitivity. Supportive treatment   #Peripheral arterial disease left BKA Sees Dr. Lucky Cowboy as an outpatient Holding Xarelto and aspirin at this time    DVT reflexes with SCDs GI prophylaxis with Protonix     All the records are reviewed and case discussed with Care Management/Social Workerr. Management plans discussed with the patient, family and they are in agreement.  CODE STATUS: FC   TOTAL TIME TAKING CARE OF THIS PATIENT: 36 minutes.   POSSIBLE D/C IN 1-2  DAYS, DEPENDING ON CLINICAL CONDITION.  Note: This dictation was prepared with Dragon dictation along with smaller phrase technology. Any transcriptional errors that result from this process are unintentional.   Nicholes Mango M.D on 10/11/2017  at 12:37 PM  Between 7am to 6pm - Pager - [phone] After 6pm go to [email or web address] - password [email or web address] [facility]  Office  [phone]  CC: Primary care physician; Alvester Morin, MD

## 2017-10-11 NOTE — Consult Note (Signed)
Cardiology Consultation:   Patient ID: Timothy Juarez; [id number]; 05/04/1951   Admit date: 10/10/2017 Date of Consult: 10/11/2017  Primary Care Provider: Alvester Morin, MD Primary Cardiologist: none Primary Electrophysiologist:  none   Patient Profile:   Timothy Juarez is a 67 y.o. male with a hx of severe peripheral vascular disease, who is being seen today for the evaluation of elevated troponin at the request of [name].  History of Present Illness:   Timothy Juarez is a67 yo man with a h/o severe peripheral vascular disease, s/p amputation, (Left AKA), who is admitted with anemia and hgb of 5 and noted to have a troponin of 0.56. He denies anginal symptoms.  He does have mild dyspnea.  No syncope.  His right lower leg has minimal swelling.  No neck or jaw pressure or pain.  Past Medical History:  Diagnosis Date  . Allergy   . Anemia   . ARF (acute respiratory failure) (HCC)    H/O  . CHF (congestive heart failure) ([place])   . COPD (chronic obstructive pulmonary disease) ([place])   . Hypertension   . Hypokalemia   . Insomnia   . Muscle contracture    MUSCLE SPASMS  . Peripheral vascular disease ([place])   . Pressure ulcer     Past Surgical History:  Procedure Laterality Date  . AMPUTATION Left 09/19/2015   Procedure: AMPUTATION BELOW KNEE;  Surgeon: Algernon Huxley, MD;  Location: [facility] ORS;  Service: Vascular;  Laterality: Left;  . AMPUTATION Left 11/01/2015   Procedure: AMPUTATION ABOVE KNEE;  Surgeon: Algernon Huxley, MD;  Location: [facility] ORS;  Service: Vascular;  Laterality: Left;  . APPLICATION OF WOUND VAC Left 10/18/2015   Procedure: APPLICATION OF WOUND VAC;  Surgeon: Algernon Huxley, MD;  Location: [facility] ORS;  Service: Vascular;  Laterality: Left;  . COLONOSCOPY WITH PROPOFOL N/A 05/25/2017   Procedure: COLONOSCOPY WITH PROPOFOL;  Surgeon: Jonathon Bellows, MD;  Location: [facility] ENDOSCOPY;  Service: Gastroenterology;  Laterality: N/A;  . ESOPHAGOGASTRODUODENOSCOPY (EGD) WITH  PROPOFOL N/A 05/25/2017   Procedure: ESOPHAGOGASTRODUODENOSCOPY (EGD) WITH PROPOFOL;  Surgeon: Jonathon Bellows, MD;  Location: [facility] ENDOSCOPY;  Service: Gastroenterology;  Laterality: N/A;  . GIVENS CAPSULE STUDY N/A 07/14/2017   Procedure: GIVENS CAPSULE STUDY;  Surgeon: Jonathon Bellows, MD;  Location: [facility] ENDOSCOPY;  Service: Gastroenterology;  Laterality: N/A;  . PERIPHERAL VASCULAR CATHETERIZATION Left 01/18/2015   Procedure: Lower Extremity Angiography;  Surgeon: Algernon Huxley, MD;  Location: [place] CV LAB;  Service: Cardiovascular;  Laterality: Left;  . PERIPHERAL VASCULAR CATHETERIZATION N/A 01/18/2015   Procedure: Lower Extremity Intervention;  Surgeon: Algernon Huxley, MD;  Location: [place] CV LAB;  Service: Cardiovascular;  Laterality: N/A;  . PERIPHERAL VASCULAR CATHETERIZATION  07/30/2015   Procedure: Lower Extremity Intervention;  Surgeon: Algernon Huxley, MD;  Location: [place] CV LAB;  Service: Cardiovascular;;  . PERIPHERAL VASCULAR CATHETERIZATION N/A 07/30/2015   Procedure: Abdominal Aortogram w/Lower Extremity;  Surgeon: Algernon Huxley, MD;  Location: [place] CV LAB;  Service: Cardiovascular;  Laterality: N/A;  . PERIPHERAL VASCULAR CATHETERIZATION Left 08/22/2015   Procedure: Lower Extremity Angiography;  Surgeon: Algernon Huxley, MD;  Location: [place] CV LAB;  Service: Cardiovascular;  Laterality: Left;  . PERIPHERAL VASCULAR CATHETERIZATION Left 08/22/2015   Procedure: Lower Extremity Intervention;  Surgeon: Algernon Huxley, MD;  Location: [place] CV LAB;  Service: Cardiovascular;  Laterality: Left;  . PERIPHERAL VASCULAR CATHETERIZATION Right 03/31/2016   Procedure: Lower Extremity Angiography;  Surgeon:  Algernon Huxley, MD;  Location: [place] CV LAB;  Service: Cardiovascular;  Laterality: Right;  . PERIPHERAL VASCULAR CATHETERIZATION  03/31/2016   Procedure: Lower Extremity Intervention;  Surgeon: Algernon Huxley, MD;  Location: [place] CV LAB;  Service:  Cardiovascular;;  . PERIPHERAL VASCULAR CATHETERIZATION Left 04/10/2016   Procedure: Lower Extremity Angiography;  Surgeon: Algernon Huxley, MD;  Location: [place] CV LAB;  Service: Cardiovascular;  Laterality: Left;  . WOUND DEBRIDEMENT Left 10/18/2015   Procedure: DEBRIDEMENT WOUND   ( LEFT BKA DEBRIDEMENT );  Surgeon: Algernon Huxley, MD;  Location: [facility] ORS;  Service: Vascular;  Laterality: Left;     Home Medications:  Prior to Admission medications   Medication Sig Start Date End Date Taking? Authorizing Provider  acetaminophen (TYLENOL) 325 MG tablet Take 650 mg by mouth at bedtime.     [provider]  allopurinol (ZYLOPRIM) 100 MG tablet  03/19/17   [provider]  aspirin EC 81 MG EC tablet Take 1 tablet (81 mg total) by mouth daily. 09/24/15   Algernon Huxley, MD  baclofen (LIORESAL) 10 MG tablet Take 10 mg by mouth 3 (three) times daily. Hold for sedation    [provider]  budesonide-formoterol (SYMBICORT) 80-4.5 MCG/ACT inhaler Inhale 2 puffs into the lungs 2 (two) times daily.    [provider]  enoxaparin (LOVENOX) 100 MG/ML injection  05/23/17   [provider]  fentaNYL (DURAGESIC - DOSED MCG/HR) 12 MCG/HR Place 12 mcg onto the skin every 3 (three) days.  10/28/16   [provider]  furosemide (LASIX) 40 MG tablet Take 40 mg by mouth 2 (two) times daily.     [provider]  gabapentin (NEURONTIN) 100 MG capsule Take 100 mg by mouth at bedtime.    [provider]  Melatonin 5 MG CAPS Take 1 capsule by mouth at bedtime as needed.    [provider]  metoprolol tartrate (LOPRESSOR) 25 MG tablet Take 0.5 tablets (12.5 mg total) by mouth 2 (two) times daily. 09/24/15   Algernon Huxley, MD  Omega-3 Fatty Acids (FISH OIL) 1000 MG CAPS Take 1,000 mg by mouth every morning.    [provider]  Oxycodone HCl 10 MG TABS Take 10 mg by mouth 3 (three) times daily.    [provider]  potassium chloride  (K-DUR) 10 MEQ tablet Take 20 mEq by mouth daily.     [provider]  tiotropium (SPIRIVA) 18 MCG inhalation capsule Place 18 mcg into inhaler and inhale daily.    [provider]  XARELTO 20 MG TABS tablet  04/16/17   [provider]    Inpatient Medications: Scheduled Meds: . azithromycin  250 mg Oral Daily  . fentaNYL  12.5 mcg Transdermal Q72H  . mometasone-formoterol  2 puff Inhalation BID  . pantoprazole (PROTONIX) IV  40 mg Intravenous Q12H  . potassium chloride  40 mEq Oral Q4H  . sodium chloride flush  3 mL Intravenous Q12H  . tiotropium  18 mcg Inhalation Daily   Continuous Infusions: . iron sucrose     PRN Meds: acetaminophen **OR** acetaminophen, albuterol, ondansetron **OR** ondansetron (ZOFRAN) IV, oxyCODONE, sodium chloride flush  Allergies:   No Known Allergies  Social History:   Social History   Socioeconomic History  . Marital status: Legally Separated    Spouse name: Not on file  . Number of children: Not on file  . Years of education: Not on file  .  Highest education level: Not on file  Social Needs  . Financial resource strain: Not on file  . Food insecurity - worry: Not on file  . Food insecurity - inability: Not on file  . Transportation needs - medical: Not on file  . Transportation needs - non-medical: Not on file  Occupational History  . Not on file  Tobacco Use  . Smoking status: Former Smoker    Packs/day: 1.00    Years: 44.00    Pack years: 44.00    Types: Cigarettes    Last attempt to quit: 11/17/2012    Years since quitting: 4.9  . Smokeless tobacco: Never Used  Substance and Sexual Activity  . Alcohol use: No  . Drug use: No  . Sexual activity: No  Other Topics Concern  . Not on file  Social History Narrative  . Not on file    Family History:   Hypertension  ROS:  Please see the history of present illness.  All other ROS reviewed and negative.     Physical Exam/Data:   Vitals:   10/10/17 1752  10/10/17 2000 10/11/17 0349 10/11/17 0755  BP: (!) 129/55 (!) 126/52 (!) 128/49 138/65  Pulse: 86 85 74 76  Resp: 20 19 17 18   Temp: 99.2 F (37.3 C) 99.5 F (37.5 C) 98.2 F (36.8 C)   TempSrc: Oral Oral Oral   SpO2: 100% 98% 94% 99%  Weight:   194 lb 4.8 oz (88.1 kg)   Height:        Intake/Output Summary (Last 24 hours) at 10/11/2017 1107 Last data filed at 10/11/2017 8242 Gross per 24 hour  Intake 786.67 ml  Output 1100 ml  Net -313.33 ml   Filed Weights   10/10/17 0709 10/10/17 0950 10/11/17 0349  Weight: 202 lb (91.6 kg) 195 lb 4.8 oz (88.6 kg) 194 lb 4.8 oz (88.1 kg)   Body mass index is 25.63 kg/m.  General:  Well nourished, well developed, in no acute distress HEENT: normal Lymph: no adenopathy Neck: 6 cm JVD Endocrine:  No thryomegaly Vascular: No carotid bruits; FA pulses 2+ bilaterally without bruits  Cardiac:  normal S1, S2; RRR; no murmur, soft S4 Lungs:  clear to auscultation bilaterally, no wheezing, rhonchi or rales  Abd: soft, nontender, no hepatomegaly  Ext: no edema, left AKA Musculoskeletal:  No deformities, BUE and BLE strength normal and equal Skin: warm and dry  Neuro:  CNs 2-12 intact, no focal abnormalities noted Psych:  Normal affect   EKG:  The EKG was personally reviewed and demonstrates: Normal sinus rhythm with noise artifact Telemetry:  Telemetry was personally reviewed and demonstrates:  normal sinus rhythm  Relevant CV Studies: 2D echo demonstrates normal left ventricular systolic function, PA pressure 42  Laboratory Data:  Chemistry Recent Labs  Lab 10/10/17 0706 10/11/17 0338  NA 138 140  K 3.8 3.4*  CL 108 110  CO2 21* 23  GLUCOSE 121* 101*  BUN 21* 13  CREATININE 1.30* 0.88  CALCIUM 8.5* 8.2*  GFRNONAA 56* >60  GFRAA >60 >60  ANIONGAP 9 7    Recent Labs  Lab 10/11/17 0338  PROT 6.9  ALBUMIN 3.4*  AST 16  ALT 8*  ALKPHOS 89  BILITOT 2.0*   Hematology Recent Labs  Lab 10/10/17 0706 10/10/17 2031  10/11/17 0338  WBC 13.0*  --  13.7*  RBC 3.52*  --  4.12*  HGB 5.3* 8.2* 7.8*  HCT 19.1* 26.4* 25.5*  MCV 54.2*  --  61.8*  MCH 15.1*  --  19.0*  MCHC 28.0*  --  30.7*  RDW 23.0*  --  33.9*  PLT 422  --  320   Cardiac Enzymes Recent Labs  Lab 10/10/17 0706 10/10/17 2031 10/11/17 0338 10/11/17 1028  TROPONINI 0.03* 0.56* 0.44* 0.31*   No results for input(s): TROPIPOC in the last 168 hours.  BNPNo results for input(s): BNP, PROBNP in the last 168 hours.  DDimer No results for input(s): DDIMER in the last 168 hours.  Radiology/Studies:  Dg Chest 2 View  Result Date: 10/10/2017 CLINICAL DATA:  Chest pain. EXAM: CHEST  2 VIEW COMPARISON:  November 07, 2015 FINDINGS: Chronic changes in the left apex are stable. Reticular changes in the right upper lung are mildly more prominent the interval. No pneumothorax. The cardiomediastinal silhouette is stable. No other acute abnormalities. IMPRESSION: 1. Mild reticular changes in the right upper lung are more prominent since March 2017 but may have been present at that time. This may be a progressive chronic process. However, a subtle acute infiltrate is not excluded. 2. Chronic changes in the left apex. Electronically Signed   By: Dorise Bullion III M.D   On: 10/10/2017 07:34   Ct Angio Abd/pel W/ And/or W/o  Result Date: 10/11/2017 CLINICAL DATA:  Anemia EXAM: CTA ABDOMEN AND PELVIS wITHOUT AND WITH CONTRAST TECHNIQUE: Multidetector CT imaging of the abdomen and pelvis was performed using the standard protocol during bolus administration of intravenous contrast. Multiplanar reconstructed images and MIPs were obtained and reviewed to evaluate the vascular anatomy. CONTRAST:  135mL ISOVUE-370 IOPAMIDOL (ISOVUE-370) INJECTION 76% COMPARISON:  04/10/2016 FINDINGS: VASCULAR Aorta: There is soft smooth plaque throughout the abdominal aorta. Mild atherosclerotic calcifications. No luminal narrowing. No evidence of aneurysm. Celiac: Patent. Atherosclerotic  calcifications. Branch vessels patent. SMA: Origin is patent. There is smooth plaque just beyond the origin of the SMA causing mild narrowing. Branch vessels grossly patent. Renals: Single bilateral renal arteries are patent with smooth soft plaque and some atherosclerotic calcification. IMA: Diminutive and patent. Inflow: There is soft and calcified plaque in the right common iliac artery without significant narrowing. There is mild narrowing involving the proximal right external iliac artery. There is significant narrowing of the distal right external iliac artery. See table position -1 142.6. Right internal iliac artery is moderately diseased. Left common iliac artery is patent with atherosclerotic changes. There is a stent in the left external iliac artery. The stent is patent. There is significant. This is best appreciated on coronal reformat images. See image 57 of series 10. Left internal iliac artery is moderately diseased. Narrowing just beyond the stent Proximal Outflow: Right common femoral and visualized superficial femoral arteries are grossly patent. Left common femoral artery is grossly patent. Left superficial femoral artery is occluded. Veins: No obvious DVT. Review of the MIP images confirms the above findings. NON-VASCULAR Lower chest: There is consolidation in the posterior basal left lower lobe. Hepatobiliary: Mild diffuse hepatic steatosis. Gallbladder is unremarkable. Pancreas: Unremarkable Spleen: Unremarkable Adrenals/Urinary Tract: Kidneys and adrenal glands are within normal limits. Mild diffuse bladder wall thickening. Stomach/Bowel: There is no convincing diverticulosis scattered throughout the length of the colon is present. There is wall thickening of the rectum as well as stranding in the adjacent fat. An inflammatory process is not excluded. There is no evidence of small-bowel obstruction. Normal appendix. Stomach is decompressed. Small hiatal hernia is suspected. Duodenal C-loop is  unremarkable. Focal contrast extravasation throughout the length of the colon to suggest active  gastrointestinal bleeding. Lymphatic: No abnormal retroperitoneal adenopathy. Reproductive: Prostate is within normal limits. Other: There is no free fluid. The large hematoma in the proximal left thigh has largely resolved with some residual soft tissue density. Musculoskeletal: There is no vertebral compression deformity. IMPRESSION: VASCULAR Significant narrowing of the distal right external iliac artery. Significant narrowing of the distal left external iliac artery, beyond the stent. Left superficial femoral artery occlusion. Large hematoma in the left inguinal region and proximal left thigh has largely resolved with some residual chronic soft tissue density. NON-VASCULAR Patchy consolidation in the left lower lobe. Findings may represent pneumonia. Mild diffuse bladder wall thickening. Correlation with urinalysis is recommended. There is no convincing contrast extravasation within the colon to suggest active gastrointestinal bleeding. Wall thickening of the rectum with adjacent fatty stranding. Findings may represent an inflammatory process and could result in blood in stool. Correlate clinically. Electronically Signed   By: Marybelle Killings M.D.   On: 10/11/2017 10:56    Assessment and Plan:   1. Elevated troponin -the patient has no clinical symptoms suggestive of an acute coronary syndrome.  He has a supply demand mismatch with his severe anemia, hemoglobin of 5.  Virtually everybody with a sudden drop in her hemoglobin down to 5 will have and elevate.  At this point despite his multiple cardiac risk factors, I would not recommend any additional evaluation as an inpatient.  I have reviewed his records and do not see any cardiac evaluation in the past and would recommend he follow-up in our practice in the next few weeks.   For questions or updates, please contact [place] Please consult [email or web address]  for contact info under Cardiology/STEMI.   Signed, Cristopher Peru, MD  10/11/2017 11:07 AM

## 2017-10-11 NOTE — Progress Notes (Signed)
Hematology/Oncology Progress Note [facility] Telephone:([phone] [phone]  Patient Care Team: Alvester Morin, MD as PCP - General (Family Medicine)   Name of the patient: Timothy Juarez  [id number]  07/26/1951  Date of visit: 10/11/17   INTERVAL HISTORY Patient was seen and examined at bedside. Feels better, no chest pain.  Review of systems- Review of Systems  Constitutional: Positive for malaise/fatigue. Negative for chills and fever.  HENT: Negative for hearing loss and tinnitus.   Eyes: Negative for pain.  Respiratory: Negative for cough and sputum production.   Cardiovascular: Negative for chest pain and palpitations.  Gastrointestinal: Negative for nausea and vomiting.  Genitourinary: Negative for dysuria.  Musculoskeletal: Negative for myalgias.  Skin: Negative for rash.  Neurological: Negative for dizziness, tingling and weakness.  Endo/Heme/Allergies: Does not bruise/bleed easily.  Psychiatric/Behavioral: Negative for hallucinations.    No Known Allergies  Patient Active Problem List   Diagnosis Date Noted  . Symptomatic anemia 10/10/2017  . Gastrointestinal hemorrhage   . Chronic deep vein thrombosis (DVT) of left lower extremity ([place])   . Iron deficiency anemia 04/21/2017  . Hypertension 03/13/2017  . Recurrent deep vein thrombosis (DVT) ([place]) 03/11/2017  . Hyperlipidemia 11/04/2016  . Pressure ulcer 04/11/2016  . Pseudoaneurysm of left femoral artery ([place]) 04/11/2016  . PAD (peripheral artery disease) ([place]) 04/10/2016  . Atherosclerosis of native arteries of extremities with gangrene, left leg ([place]) 11/01/2015  . Status post below knee amputation ([place]) 09/20/2015  . Ischemia of lower extremity 09/14/2015  . Atherosclerotic peripheral vascular disease with ulceration ([place]) 01/18/2015     Past Medical History:  Diagnosis Date  . Allergy   . Anemia   . ARF (acute respiratory failure) (HCC)    H/O  . CHF  (congestive heart failure) ([place])   . COPD (chronic obstructive pulmonary disease) ([place])   . Hypertension   . Hypokalemia   . Insomnia   . Muscle contracture    MUSCLE SPASMS  . Peripheral vascular disease ([place])   . Pressure ulcer      Past Surgical History:  Procedure Laterality Date  . AMPUTATION Left 09/19/2015   Procedure: AMPUTATION BELOW KNEE;  Surgeon: Algernon Huxley, MD;  Location: [facility] ORS;  Service: Vascular;  Laterality: Left;  . AMPUTATION Left 11/01/2015   Procedure: AMPUTATION ABOVE KNEE;  Surgeon: Algernon Huxley, MD;  Location: [facility] ORS;  Service: Vascular;  Laterality: Left;  . APPLICATION OF WOUND VAC Left 10/18/2015   Procedure: APPLICATION OF WOUND VAC;  Surgeon: Algernon Huxley, MD;  Location: [facility] ORS;  Service: Vascular;  Laterality: Left;  . COLONOSCOPY WITH PROPOFOL N/A 05/25/2017   Procedure: COLONOSCOPY WITH PROPOFOL;  Surgeon: Jonathon Bellows, MD;  Location: [facility] ENDOSCOPY;  Service: Gastroenterology;  Laterality: N/A;  . ESOPHAGOGASTRODUODENOSCOPY (EGD) WITH PROPOFOL N/A 05/25/2017   Procedure: ESOPHAGOGASTRODUODENOSCOPY (EGD) WITH PROPOFOL;  Surgeon: Jonathon Bellows, MD;  Location: [facility] ENDOSCOPY;  Service: Gastroenterology;  Laterality: N/A;  . GIVENS CAPSULE STUDY N/A 07/14/2017   Procedure: GIVENS CAPSULE STUDY;  Surgeon: Jonathon Bellows, MD;  Location: [facility] ENDOSCOPY;  Service: Gastroenterology;  Laterality: N/A;  . PERIPHERAL VASCULAR CATHETERIZATION Left 01/18/2015   Procedure: Lower Extremity Angiography;  Surgeon: Algernon Huxley, MD;  Location: [place] CV LAB;  Service: Cardiovascular;  Laterality: Left;  . PERIPHERAL VASCULAR CATHETERIZATION N/A 01/18/2015   Procedure: Lower Extremity Intervention;  Surgeon: Algernon Huxley, MD;  Location: [place] CV LAB;  Service: Cardiovascular;  Laterality: N/A;  . PERIPHERAL VASCULAR CATHETERIZATION  07/30/2015   Procedure: Lower Extremity Intervention;  Surgeon: Algernon Huxley, MD;  Location: [place] CV LAB;  Service: Cardiovascular;;    . PERIPHERAL VASCULAR CATHETERIZATION N/A 07/30/2015   Procedure: Abdominal Aortogram w/Lower Extremity;  Surgeon: Algernon Huxley, MD;  Location: [place] CV LAB;  Service: Cardiovascular;  Laterality: N/A;  . PERIPHERAL VASCULAR CATHETERIZATION Left 08/22/2015   Procedure: Lower Extremity Angiography;  Surgeon: Algernon Huxley, MD;  Location: [place] [facility] LAB;  Service: Cardiovascular;  Laterality: Left;  . PERIPHERAL VASCULAR CATHETERIZATION Left 08/22/2015   Procedure: Lower Extremity Intervention;  Surgeon: Algernon Huxley, MD;  Location: [place] CV LAB;  Service: Cardiovascular;  Laterality: Left;  . PERIPHERAL VASCULAR CATHETERIZATION Right 03/31/2016   Procedure: Lower Extremity Angiography;  Surgeon: Algernon Huxley, MD;  Location: [place] CV LAB;  Service: Cardiovascular;  Laterality: Right;  . PERIPHERAL VASCULAR CATHETERIZATION  03/31/2016   Procedure: Lower Extremity Intervention;  Surgeon: Algernon Huxley, MD;  Location: [place] CV LAB;  Service: Cardiovascular;;  . PERIPHERAL VASCULAR CATHETERIZATION Left 04/10/2016   Procedure: Lower Extremity Angiography;  Surgeon: Algernon Huxley, MD;  Location: [place] [facility] LAB;  Service: Cardiovascular;  Laterality: Left;  . WOUND DEBRIDEMENT Left 10/18/2015   Procedure: DEBRIDEMENT WOUND   ( LEFT BKA DEBRIDEMENT );  Surgeon: Algernon Huxley, MD;  Location: [facility] ORS;  Service: Vascular;  Laterality: Left;    Social History   Socioeconomic History  . Marital status: Legally Separated    Spouse name: Not on file  . Number of children: Not on file  . Years of education: Not on file  . Highest education level: Not on file  Social Needs  . Financial resource strain: Not on file  . Food insecurity - worry: Not on file  . Food insecurity - inability: Not on file  . Transportation needs - medical: Not on file  . Transportation needs - non-medical: Not on file  Occupational History  . Not on file  Tobacco Use  . Smoking status: Former Smoker     Packs/day: 1.00    Years: 44.00    Pack years: 44.00    Types: Cigarettes    Last attempt to quit: 11/17/2012    Years since quitting: 4.9  . Smokeless tobacco: Never Used  Substance and Sexual Activity  . Alcohol use: No  . Drug use: No  . Sexual activity: No  Other Topics Concern  . Not on file  Social History Narrative  . Not on file     History reviewed. No pertinent family history.   Current Facility-Administered Medications:  .  acetaminophen (TYLENOL) tablet 650 mg, 650 mg, Oral, Q6H PRN **OR** acetaminophen (TYLENOL) suppository 650 mg, 650 mg, Rectal, Q6H PRN, Gouru, Aruna, MD .  albuterol (PROVENTIL) (2.5 MG/3ML) 0.083% nebulizer solution 2.5 mg, 2.5 mg, Nebulization, Q4H PRN, Gouru, Aruna, MD .  [COMPLETED] azithromycin (ZITHROMAX) tablet 500 mg, 500 mg, Oral, Daily, 500 mg at 10/10/17 1230 **FOLLOWED BY** azithromycin (ZITHROMAX) tablet 250 mg, 250 mg, Oral, Daily, Gouru, Aruna, MD, 250 mg at 10/11/17 1131 .  fentaNYL (DURAGESIC - dosed mcg/hr) 12.5 mcg, 12.5 mcg, Transdermal, Q72H, Gouru, Aruna, MD, 12.5 mcg at 10/10/17 1619 .  iron sucrose (VENOFER) 200 mg in sodium chloride 0.9 % 150 mL IVPB, 200 mg, Intravenous, Once, Earlie Server, MD .  mometasone-formoterol [facility]) 100-5 MCG/ACT inhaler 2 puff, 2 puff, Inhalation, BID, Gouru, Aruna, MD, 2 puff at 10/11/17 1132 .  ondansetron (ZOFRAN) tablet  4 mg, 4 mg, Oral, Q6H PRN **OR** ondansetron (ZOFRAN) injection 4 mg, 4 mg, Intravenous, Q6H PRN, Gouru, Aruna, MD .  oxyCODONE (Oxy IR/ROXICODONE) immediate release tablet 10 mg, 10 mg, Oral, TID PRN, Gouru, Aruna, MD .  pantoprazole (PROTONIX) injection 40 mg, 40 mg, Intravenous, Q12H, Gouru, Aruna, MD, 40 mg at 10/11/17 1131 .  sodium chloride flush (NS) 0.9 % injection 3 mL, 3 mL, Intravenous, Q12H, Gouru, Aruna, MD, 3 mL at 10/11/17 1133 .  sodium chloride flush (NS) 0.9 % injection 3 mL, 3 mL, Intravenous, PRN, Gouru, Aruna, MD, 3 mL at 10/10/17 2229 .  tiotropium (SPIRIVA)  inhalation capsule 18 mcg, 18 mcg, Inhalation, Daily, Gouru, Aruna, MD, 18 mcg at 10/11/17 1132   Physical exam:  Vitals:   10/10/17 1752 10/10/17 2000 10/11/17 0349 10/11/17 0755  BP: (!) 129/55 (!) 126/52 (!) 128/49 138/65  Pulse: 86 85 74 76  Resp: 20 19 17 18   Temp: 99.2 F (37.3 C) 99.5 F (37.5 C) 98.2 F (36.8 C)   TempSrc: Oral Oral Oral   SpO2: 100% 98% 94% 99%  Weight:   194 lb 4.8 oz (88.1 kg)   Height:       GENERAL: No distress, well nourished.  SKIN: No rashes or significant lesions  HEAD: Normocephalic, No masses, lesions, tenderness or abnormalities  EYES: Conjunctiva are pale, non icteric ENT: External ears normal ,lips, buccal mucosa, and tongue normal and mucous membranes are moist  LYMPH: No palpable cervical and axillary lymphadenopathy  LUNGS: Clear to auscultation, no crackles or wheezes HEART: Regular rate &rhythm, no murmurs, no gallops, S1 normal and S2 normal  ABDOMEN: Abdomen soft, non-tender, normal bowel sounds, MUSCULOSKELETAL: No CVA tenderness and no tenderness on percussion of the back or rib cage. EXTREMITIES: left above knee amputation stump non tenderor swelling.No edema at right lower extremity.  NEURO: Alert & oriented, no focal motor/sensory deficits.        CMP Latest Ref Rng & Units 10/11/2017  Glucose 65 - 99 mg/dL 101(H)  BUN 6 - 20 mg/dL 13  Creatinine 0.61 - 1.24 mg/dL 0.88  Sodium 135 - 145 mmol/L 140  Potassium 3.5 - 5.1 mmol/L 3.4(L)  Chloride 101 - 111 mmol/L 110  CO2 22 - 32 mmol/L 23  Calcium 8.9 - 10.3 mg/dL 8.2(L)  Total Protein 6.5 - 8.1 g/dL 6.9  Total Bilirubin 0.3 - 1.2 mg/dL 2.0(H)  Alkaline Phos 38 - 126 U/L 89  AST 15 - 41 U/L 16  ALT 17 - 63 U/L 8(L)   CBC Latest Ref Rng & Units 10/11/2017  WBC 3.8 - 10.6 K/uL -  Hemoglobin 13.0 - 18.0 g/dL 8.0(L)  Hematocrit 40.0 - 52.0 % 26.4(L)  Platelets 150 - 440 K/uL -    @IMAGES @  Dg Chest 2 View  Result Date: 10/10/2017 CLINICAL DATA:  Chest pain.  EXAM: CHEST  2 VIEW COMPARISON:  November 07, 2015 FINDINGS: Chronic changes in the left apex are stable. Reticular changes in the right upper lung are mildly more prominent the interval. No pneumothorax. The cardiomediastinal silhouette is stable. No other acute abnormalities. IMPRESSION: 1. Mild reticular changes in the right upper lung are more prominent since March 2017 but may have been present at that time. This may be a progressive chronic process. However, a subtle acute infiltrate is not excluded. 2. Chronic changes in the left apex. Electronically Signed   By: Dorise Bullion III M.D   On: 10/10/2017 07:34   Ct Angio Abd/pel  W/ And/or W/o  Result Date: 10/11/2017 CLINICAL DATA:  Anemia EXAM: CTA ABDOMEN AND PELVIS wITHOUT AND WITH CONTRAST TECHNIQUE: Multidetector CT imaging of the abdomen and pelvis was performed using the standard protocol during bolus administration of intravenous contrast. Multiplanar reconstructed images and MIPs were obtained and reviewed to evaluate the vascular anatomy. CONTRAST:  117mL ISOVUE-370 IOPAMIDOL (ISOVUE-370) INJECTION 76% COMPARISON:  04/10/2016 FINDINGS: VASCULAR Aorta: There is soft smooth plaque throughout the abdominal aorta. Mild atherosclerotic calcifications. No luminal narrowing. No evidence of aneurysm. Celiac: Patent. Atherosclerotic calcifications. Branch vessels patent. SMA: Origin is patent. There is smooth plaque just beyond the origin of the SMA causing mild narrowing. Branch vessels grossly patent. Renals: Single bilateral renal arteries are patent with smooth soft plaque and some atherosclerotic calcification. IMA: Diminutive and patent. Inflow: There is soft and calcified plaque in the right common iliac artery without significant narrowing. There is mild narrowing involving the proximal right external iliac artery. There is significant narrowing of the distal right external iliac artery. See table position -1 142.6. Right internal iliac artery is  moderately diseased. Left common iliac artery is patent with atherosclerotic changes. There is a stent in the left external iliac artery. The stent is patent. There is significant. This is best appreciated on coronal reformat images. See image 57 of series 10. Left internal iliac artery is moderately diseased. Narrowing just beyond the stent Proximal Outflow: Right common femoral and visualized superficial femoral arteries are grossly patent. Left common femoral artery is grossly patent. Left superficial femoral artery is occluded. Veins: No obvious DVT. Review of the MIP images confirms the above findings. NON-VASCULAR Lower chest: There is consolidation in the posterior basal left lower lobe. Hepatobiliary: Mild diffuse hepatic steatosis. Gallbladder is unremarkable. Pancreas: Unremarkable Spleen: Unremarkable Adrenals/Urinary Tract: Kidneys and adrenal glands are within normal limits. Mild diffuse bladder wall thickening. Stomach/Bowel: There is no convincing diverticulosis scattered throughout the length of the colon is present. There is wall thickening of the rectum as well as stranding in the adjacent fat. An inflammatory process is not excluded. There is no evidence of small-bowel obstruction. Normal appendix. Stomach is decompressed. Small hiatal hernia is suspected. Duodenal C-loop is unremarkable. Focal contrast extravasation throughout the length of the colon to suggest active gastrointestinal bleeding. Lymphatic: No abnormal retroperitoneal adenopathy. Reproductive: Prostate is within normal limits. Other: There is no free fluid. The large hematoma in the proximal left thigh has largely resolved with some residual soft tissue density. Musculoskeletal: There is no vertebral compression deformity. IMPRESSION: VASCULAR Significant narrowing of the distal right external iliac artery. Significant narrowing of the distal left external iliac artery, beyond the stent. Left superficial femoral artery occlusion.  Large hematoma in the left inguinal region and proximal left thigh has largely resolved with some residual chronic soft tissue density. NON-VASCULAR Patchy consolidation in the left lower lobe. Findings may represent pneumonia. Mild diffuse bladder wall thickening. Correlation with urinalysis is recommended. There is no convincing contrast extravasation within the colon to suggest active gastrointestinal bleeding. Wall thickening of the rectum with adjacent fatty stranding. Findings may represent an inflammatory process and could result in blood in stool. Correlate clinically. Electronically Signed   By: Marybelle Killings M.D.   On: 10/11/2017 10:56    Assessment and plan- Patient is a 67 y.o. male with history of recurrent DVT on Xarelto 20mg  daily, peripheral artery disease, history of iron deficiency anemia, gastritis, high grade dysplastic colon presented with chest pain, symptomatic anemia, positive stool occult.   #  Symptomatic anemia:s/p PRBC transfusion. Hemoglobin improved appropriately. [name] Keep hemoglogbin above 7.  # Iron panel is consistent with iron deficiency, plan IV Venofer once while inpatient followed by outpatient Venofer. Patient has had venofer infusion in the past tolerated well. Communicated with RN.  # GI bleeding: GI recommends RBC scan or CT angiogram, and possible embolization if indicated.  # Recurrent DVT: he is at high risk of recurrent DVT, agree with hold anticoagulation given blood loss. Consult vascular surgeon for IVC filter placement.  # Chest pain: likely demand mismatch, cardiology recommended noted.  Thank you for this kind referral and the opportunity to participate in the care of this patient    Earlie Server, MD, PhD Hematology [place] at [facility] Pager- [phone] 10/11/2017

## 2017-10-12 ENCOUNTER — Encounter: Payer: Self-pay | Admitting: *Deleted

## 2017-10-12 ENCOUNTER — Encounter
Admission: EM | Disposition: A | Discharge status: Skilled Nursing Facility | Payer: Self-pay | Source: Home / Self Care | Attending: Internal Medicine

## 2017-10-12 DIAGNOSIS — K922 Gastrointestinal hemorrhage, unspecified: Secondary | ICD-10-CM | POA: Diagnosis not present

## 2017-10-12 DIAGNOSIS — R079 Chest pain, unspecified: Secondary | ICD-10-CM | POA: Diagnosis not present

## 2017-10-12 DIAGNOSIS — I82409 Acute embolism and thrombosis of unspecified deep veins of unspecified lower extremity: Secondary | ICD-10-CM | POA: Diagnosis not present

## 2017-10-12 DIAGNOSIS — J189 Pneumonia, unspecified organism: Secondary | ICD-10-CM | POA: Diagnosis not present

## 2017-10-12 DIAGNOSIS — D62 Acute posthemorrhagic anemia: Secondary | ICD-10-CM | POA: Diagnosis not present

## 2017-10-12 DIAGNOSIS — I248 Other forms of acute ischemic heart disease: Secondary | ICD-10-CM | POA: Diagnosis not present

## 2017-10-12 DIAGNOSIS — Z7951 Long term (current) use of inhaled steroids: Secondary | ICD-10-CM | POA: Diagnosis not present

## 2017-10-12 DIAGNOSIS — D649 Anemia, unspecified: Secondary | ICD-10-CM | POA: Diagnosis not present

## 2017-10-12 DIAGNOSIS — K921 Melena: Secondary | ICD-10-CM | POA: Diagnosis not present

## 2017-10-12 DIAGNOSIS — I5032 Chronic diastolic (congestive) heart failure: Secondary | ICD-10-CM | POA: Diagnosis not present

## 2017-10-12 HISTORY — PX: IVC FILTER INSERTION: CATH118245

## 2017-10-12 LAB — CBC
HEMATOCRIT: 26.2 % — AB (ref 40.0–52.0)
Hemoglobin: 7.8 g/dL — ABNORMAL LOW (ref 13.0–18.0)
MCH: 18.8 pg — AB (ref 26.0–34.0)
MCHC: 30 g/dL — ABNORMAL LOW (ref 32.0–36.0)
MCV: 62.8 fL — AB (ref 80.0–100.0)
Platelets: 332 10*3/uL (ref 150–440)
RBC: 4.16 MIL/uL — AB (ref 4.40–5.90)
RDW: 34 % — ABNORMAL HIGH (ref 11.5–14.5)
WBC: 13.5 10*3/uL — AB (ref 3.8–10.6)

## 2017-10-12 LAB — HEMOGLOBIN AND HEMATOCRIT, BLOOD
HCT: 24.7 % — ABNORMAL LOW (ref 40.0–52.0)
HCT: 26.6 % — ABNORMAL LOW (ref 40.0–52.0)
Hemoglobin: 7.4 g/dL — ABNORMAL LOW (ref 13.0–18.0)
Hemoglobin: 7.9 g/dL — ABNORMAL LOW (ref 13.0–18.0)

## 2017-10-12 SURGERY — IVC FILTER INSERTION
Anesthesia: Moderate Sedation

## 2017-10-12 MED ORDER — SODIUM CHLORIDE 0.9 % IV SOLN
INTRAVENOUS | Status: AC
  Filled 2017-10-12: qty 1.5

## 2017-10-12 MED ORDER — MIDAZOLAM HCL 2 MG/2ML IJ SOLN
INTRAMUSCULAR | Status: DC | PRN
  Administered 2017-10-12: 2 mg via INTRAVENOUS

## 2017-10-12 MED ORDER — LIDOCAINE-EPINEPHRINE (PF) 1 %-1:200000 IJ SOLN
INTRAMUSCULAR | Status: AC
  Filled 2017-10-12: qty 30

## 2017-10-12 MED ORDER — SODIUM CHLORIDE 0.9 % IV SOLN: 1.5000 g | Freq: Once | INTRAVENOUS | Status: DC

## 2017-10-12 MED ORDER — HEPARIN (PORCINE) IN NACL 2-0.9 UNIT/ML-% IJ SOLN
INTRAMUSCULAR | Status: AC
  Filled 2017-10-12: qty 500

## 2017-10-12 MED ORDER — IOPAMIDOL (ISOVUE-300) INJECTION 61%
INTRAVENOUS | Status: DC | PRN
  Administered 2017-10-12: 15 mL via INTRAVENOUS

## 2017-10-12 MED ORDER — MIDAZOLAM HCL 2 MG/2ML IJ SOLN
INTRAMUSCULAR | Status: AC
Start: 2017-10-12 — End: 2017-10-12
  Filled 2017-10-12: qty 2

## 2017-10-12 MED ORDER — SODIUM CHLORIDE 0.9 % IV SOLN
INTRAVENOUS | Status: DC
  Administered 2017-10-12: 09:00:00 via INTRAVENOUS

## 2017-10-12 MED ORDER — FENTANYL CITRATE (PF) 100 MCG/2ML IJ SOLN
INTRAMUSCULAR | Status: DC | PRN
  Administered 2017-10-12: 50 ug via INTRAVENOUS
  Administered 2017-10-12: 50 ug

## 2017-10-12 MED ORDER — FENTANYL CITRATE (PF) 100 MCG/2ML IJ SOLN
INTRAMUSCULAR | Status: AC
  Filled 2017-10-12: qty 2

## 2017-10-12 SURGICAL SUPPLY — 3 items
FILTER VC CELECT-FEMORAL (Filter) ×2 IMPLANT
PACK ANGIOGRAPHY (CUSTOM PROCEDURE TRAY) ×2 IMPLANT
WIRE J 3MM .035X145CM (WIRE) ×2 IMPLANT

## 2017-10-12 NOTE — Progress Notes (Signed)
[facility] NAME: Timothy Juarez    MR#:  [id number]  DATE OF BIRTH:  Feb 25, 1951  SUBJECTIVE:  CHIEF COMPLAINT: Is resting comfortably.  For IVC  filter placement today as per my discussion with vascular.  Patient with no complaints  REVIEW OF SYSTEMS:  CONSTITUTIONAL: No fever, fatigue or weakness.  EYES: No blurred or double vision.  EARS, NOSE, AND THROAT: No tinnitus or ear pain.  RESPIRATORY: No cough, shortness of breath, wheezing or hemoptysis.  CARDIOVASCULAR: No chest pain, orthopnea, edema.  GASTROINTESTINAL: No nausea, vomiting, diarrhea or abdominal pain.  GENITOURINARY: No dysuria, hematuria.  ENDOCRINE: No polyuria, nocturia,  HEMATOLOGY: No anemia, easy bruising or bleeding SKIN: No rash or lesion. MUSCULOSKELETAL: No joint pain or arthritis.   NEUROLOGIC: No tingling, numbness, weakness.  PSYCHIATRY: No anxiety or depression.   DRUG ALLERGIES:  No Known Allergies  VITALS:  Blood pressure (!) 157/75, pulse 84, temperature 98.3 F (36.8 C), temperature source Oral, resp. rate 16, height 6\' 1"  (1.854 m), weight 91.6 kg (202 lb), SpO2 96 %.  PHYSICAL EXAMINATION:  GENERAL:  67 y.o.-year-old patient lying in the bed with no acute distress.  EYES: Pupils equal, round, reactive to light and accommodation. No scleral icterus. Extraocular muscles intact.  HEENT: Head atraumatic, normocephalic. Oropharynx and nasopharynx clear.  NECK:  Supple, no jugular venous distention. No thyroid enlargement, no tenderness.  LUNGS: Normal breath sounds bilaterally, no wheezing, rales,rhonchi or crepitation. No use of accessory muscles of respiration.  CARDIOVASCULAR: S1, S2 normal. No murmurs, rubs, or gallops.  ABDOMEN: Soft, nontender, nondistended. Bowel sounds present. No organomegaly or mass.  EXTREMITIES: No pedal edema, cyanosis, or clubbing.  NEUROLOGIC: Cranial nerves II through XII are intact. Muscle strength 5/5  in all extremities. Sensation intact. Gait not checked.  PSYCHIATRIC: The patient is alert and oriented x 3.  SKIN: No obvious rash, lesion, or ulcer.    LABORATORY PANEL:   CBC Recent Labs  Lab 10/12/17 0548 10/12/17 1304  WBC 13.5*  --   HGB 7.8* 7.9*  HCT 26.2* 26.6*  PLT 332  --    ------------------------------------------------------------------------------------------------------------------  Chemistries  Recent Labs  Lab 10/11/17 0338  NA 140  K 3.4*  CL 110  CO2 23  GLUCOSE 101*  BUN 13  CREATININE 0.88  CALCIUM 8.2*  AST 16  ALT 8*  ALKPHOS 89  BILITOT 2.0*   ------------------------------------------------------------------------------------------------------------------  Cardiac Enzymes Recent Labs  Lab 10/11/17 1555  TROPONINI 0.25*   ------------------------------------------------------------------------------------------------------------------  RADIOLOGY:  Ct Angio Abd/pel W/ And/or W/o  Result Date: 10/11/2017 CLINICAL DATA:  Anemia EXAM: CTA ABDOMEN AND PELVIS wITHOUT AND WITH CONTRAST TECHNIQUE: Multidetector CT imaging of the abdomen and pelvis was performed using the standard protocol during bolus administration of intravenous contrast. Multiplanar reconstructed images and MIPs were obtained and reviewed to evaluate the vascular anatomy. CONTRAST:  169mL ISOVUE-370 IOPAMIDOL (ISOVUE-370) INJECTION 76% COMPARISON:  04/10/2016 FINDINGS: VASCULAR Aorta: There is soft smooth plaque throughout the abdominal aorta. Mild atherosclerotic calcifications. No luminal narrowing. No evidence of aneurysm. Celiac: Patent. Atherosclerotic calcifications. Branch vessels patent. SMA: Origin is patent. There is smooth plaque just beyond the origin of the SMA causing mild narrowing. Branch vessels grossly patent. Renals: Single bilateral renal arteries are patent with smooth soft plaque and some atherosclerotic calcification. IMA: Diminutive and patent. Inflow:  There is soft and calcified plaque in the right common iliac artery without significant narrowing. There is mild narrowing involving the proximal  right external iliac artery. There is significant narrowing of the distal right external iliac artery. See table position -1 142.6. Right internal iliac artery is moderately diseased. Left common iliac artery is patent with atherosclerotic changes. There is a stent in the left external iliac artery. The stent is patent. There is significant. This is best appreciated on coronal reformat images. See image 57 of series 10. Left internal iliac artery is moderately diseased. Narrowing just beyond the stent Proximal Outflow: Right common femoral and visualized superficial femoral arteries are grossly patent. Left common femoral artery is grossly patent. Left superficial femoral artery is occluded. Veins: No obvious DVT. Review of the MIP images confirms the above findings. NON-VASCULAR Lower chest: There is consolidation in the posterior basal left lower lobe. Hepatobiliary: Mild diffuse hepatic steatosis. Gallbladder is unremarkable. Pancreas: Unremarkable Spleen: Unremarkable Adrenals/Urinary Tract: Kidneys and adrenal glands are within normal limits. Mild diffuse bladder wall thickening. Stomach/Bowel: There is no convincing diverticulosis scattered throughout the length of the colon is present. There is wall thickening of the rectum as well as stranding in the adjacent fat. An inflammatory process is not excluded. There is no evidence of small-bowel obstruction. Normal appendix. Stomach is decompressed. Small hiatal hernia is suspected. Duodenal C-loop is unremarkable. Focal contrast extravasation throughout the length of the colon to suggest active gastrointestinal bleeding. Lymphatic: No abnormal retroperitoneal adenopathy. Reproductive: Prostate is within normal limits. Other: There is no free fluid. The large hematoma in the proximal left thigh has largely resolved with  some residual soft tissue density. Musculoskeletal: There is no vertebral compression deformity. IMPRESSION: VASCULAR Significant narrowing of the distal right external iliac artery. Significant narrowing of the distal left external iliac artery, beyond the stent. Left superficial femoral artery occlusion. Large hematoma in the left inguinal region and proximal left thigh has largely resolved with some residual chronic soft tissue density. NON-VASCULAR Patchy consolidation in the left lower lobe. Findings may represent pneumonia. Mild diffuse bladder wall thickening. Correlation with urinalysis is recommended. There is no convincing contrast extravasation within the colon to suggest active gastrointestinal bleeding. Wall thickening of the rectum with adjacent fatty stranding. Findings may represent an inflammatory process and could result in blood in stool. Correlate clinically. Electronically Signed   By: Marybelle Killings M.D.   On: 10/11/2017 10:56    EKG:   Orders placed or performed during the hospital encounter of 10/10/17  . ED EKG within 10 minutes  . ED EKG within 10 minutes  . EKG 12-Lead  . EKG 12-Lead    ASSESSMENT AND PLAN:    #Symptomatic anemia-acute on chronic Hemodynamically stable at this time Received 2 units of blood transfusion on 10/10/2017 Hemoglobin 5.3-8.2-7.8-7.9 Monitor hemoglobin and hematocrit, transfuse as needed Discontinuing Xarelto and aspirin at this time Protonix CT angiogram of the abdomen and pelvis with narrowing of the bilateral external iliac arteries and resolving hematoma in the left pelvis, rectal wall thickening which could cause blood in the stool Appreciate GI recommendations might consider endoscopy after holding Xarelto at least for 48 hours Patient recently had a EGD, capsule study by gastroenterology Stool guaiac was positive in the emergency department  #History of recurrent DVTs Hold Xarelto and aspirin at this time Oncology is following As  patient is at high risk for recurrent DVTs vascular surgery is consulted ,  patient is scheduled for IVC filter placement today afternoon  #Chest pain could be demand ischemia Monitor patient on telemetry, patient is a symptomatic during my examination Cycle cardiac biomarkers 0.03-4.56-0.44  Echocardiogram-60-65% ejection fraction Cardiology recommending no interventions at this time as it is a demand ischemia   #Community acquired pneumonia Chest x-ray with possible infiltrate and patient is reporting chest pain which could be pleuritic Rocephin and azithromycin given initially and eventually Rocephin discontinued and continue azithromycin for a total of 5 days and sputum culture and sensitivity if patient can give a sample Supportive treatment   #Peripheral arterial disease left BKA Sees Dr. Lucky Cowboy as an outpatient Holding Xarelto and aspirin at this time    DVT reflexes with SCDs GI prophylaxis with Protonix     All the records are reviewed and case discussed with Care Management/Social Workerr. Management plans discussed with the patient, family and they are in agreement.  CODE STATUS: FC   TOTAL TIME TAKING CARE OF THIS PATIENT: 36 minutes.   POSSIBLE D/C IN 1-2  DAYS, DEPENDING ON CLINICAL CONDITION.  Note: This dictation was prepared with Dragon dictation along with smaller phrase technology. Any transcriptional errors that result from this process are unintentional.   Nicholes Mango M.D on 10/12/2017 at 2:59 PM  Between 7am to 6pm - Pager - [phone] After 6pm go to [email or web address] - password [email or web address] [facility]  Office  [phone]  CC: Primary care physician; Alvester Morin, MD

## 2017-10-12 NOTE — Care Management (Signed)
From [facility]. Presented with chest pain and anemia requiring transfusion. Chronic Xarelto for chronic DVT.  Consults pending: gi- gi bleeding;  oncology: for  recurrent DVT, Vascular: IVC  filter placement; cardiology: chest pain with elevated troponins.

## 2017-10-12 NOTE — Progress Notes (Signed)
   Jonathon Bellows , MD [address] [place], [place], [place], [address] [address], [place], [place], [place], [address] Phone: [phone]  Fax: [phone]  Patient away in procedure , not on floor.    Timothy Juarez is a 67 y.o. y/o male with recent workup including EGD(gastric intestinal metaplasia), colonoscopy(multiple polyps seen ), and small bowel capsule(incomplete study ) for iron deficiency anemia, presents with chest pain and noted to have hemoglobin of 5.3 while on Xarelto . Presently being treated with a pneumonia. Receiving an IVC filter.     Plan: 1. Monitor CBC and transfuse 2. Check B12, obtain IV iron  3. Once received atleast 48 hours of antibiotics for pneumonia  can asses if safe for anesthesia to undergo EGD+/- colonoscopy. Will reassess tomorrow.      LOS: 2 days   Jonathon Bellows, MD 10/12/2017, 2:59 PM

## 2017-10-12 NOTE — Progress Notes (Signed)
Per Dr. Lucky Cowboy Pt to remain flat for 1 hr post procedure. Up time 1700.

## 2017-10-12 NOTE — Op Note (Signed)
[facility]   OPERATIVE NOTE    PRE-OPERATIVE DIAGNOSIS: Recurrent left lower extremity DVT, GI bleed with resultant anemia requiring cessation of anticoagulation  POST-OPERATIVE DIAGNOSIS: same as above  PROCEDURE: 1.   Ultrasound guidance for vascular access to the right femoral vein 2.   Catheter placement into the inferior vena cava 3.   Inferior venacavogram 4.   Placement of a Cook Celect IVC filter  SURGEON: [name], MD  ASSISTANT(S): None  ANESTHESIA: local with Moderate Conscious Sedation for approximately 15 minutes using 2 mg of Versed and 50 Mcg of Fentanyl  ESTIMATED BLOOD LOSS: 2 cc  CONTRAST: 15 cc  FLUORO TIME: less than one minute  FINDING(S): 1.  Patent IVC  SPECIMEN(S):  none  INDICATIONS:   Timothy Juarez is a 67 y.o. male who presents with anemia from GI bleeding after a recurrent DVT a few months ago.  Inferior vena cava filter is indicated for this reason.  Risks and benefits including filter thrombosis, migration, fracture, bleeding, and infection were all discussed.  We discussed that all IVC filters that we place can be removed if desired from the patient once the need for the filter has passed.    DESCRIPTION: After obtaining full informed written consent, the patient was brought back to the vascular suite. The skin was sterilely prepped and draped in a sterile surgical field was created. Moderate conscious sedation was administered during a face to face encounter with the patient throughout the procedure with my supervision of the RN administering medicines and monitoring the patient's vital signs, pulse oximetry, telemetry and mental status throughout from the start of the procedure until the patient was taken to the recovery room. The right femoral vein was accessed under direct ultrasound guidance without difficulty with a Seldinger needle and a J-wire was then placed. After skin nick and dilatation, the delivery sheath  was placed into the inferior vena cava and an inferior venacavogram was performed. This demonstrated a patent IVC with the level of the renal veins at L1.  The filter was then deployed into the inferior vena cava at the level of L2 just below the renal veins. The delivery sheath was then removed. Pressure was held. Sterile dressings were placed. The patient tolerated the procedure well and was taken to the recovery room in stable condition.  COMPLICATIONS: None  CONDITION: Stable  [name]  10/12/2017, 3:56 PM   This note was created with Dragon Medical transcription system. Any errors in dictation are purely unintentional.

## 2017-10-12 NOTE — H&P (Signed)
[facility] Consult Note  MRN : [id number]  Timothy Juarez is a 67 y.o. ([birth year]) male who presents with chief complaint of  Chief Complaint  Patient presents with  . Chest Pain  .  History of Present Illness: I am asked to see the patient by [name] for evaluation for an IVC filter.  He is a patient well-known to our practice for his peripheral arterial disease having had multiple interventions on both legs and ultimately losing his left leg.  He has had a recurrent DVT and has been on anticoagulation now both for his PAD and his DVT for many months.  His most recent DVT was about 5 or 6 months ago.  He is admitted with anemia and GI bleeding.  He has pain and some lethargy.  He is going to have to stop his anticoagulation and both the primary service and the gastroenterology service are in agreement that his anticoagulation must be stopped.  Given his recurrent somewhat recent DVT, we are consulted for IVC filter.  He does not have any current shortness of breath.  He did have some chest pain on admission but that has improved.  Current Facility-Administered Medications  Medication Dose Route Frequency Provider Last Rate Last Dose  . 0.9 %  sodium chloride infusion   Intravenous Continuous Algernon Huxley, MD 20 mL/hr at 10/12/17 0848    . [MAR Hold] acetaminophen (TYLENOL) tablet 650 mg  650 mg Oral Q6H PRN Gouru, Aruna, MD       Or  . Doug Sou Hold] acetaminophen (TYLENOL) suppository 650 mg  650 mg Rectal Q6H PRN Gouru, Aruna, MD      . Doug Sou Hold] albuterol (PROVENTIL) (2.5 MG/3ML) 0.083% nebulizer solution 2.5 mg  2.5 mg Nebulization Q4H PRN Gouru, Aruna, MD      . Doug Sou Hold] azithromycin (ZITHROMAX) tablet 250 mg  250 mg Oral Daily Gouru, Aruna, MD   250 mg at 10/12/17 0847  . cefUROXime (ZINACEF) 1.5 g in sodium chloride 0.9 % 100 mL IVPB  1.5 g Intravenous Once Algernon Huxley, MD      . Doug Sou Hold] fentaNYL ([place] - dosed mcg/hr) 12.5 mcg  12.5 mcg  Transdermal Q72H Gouru, Illene Silver, MD   12.5 mcg at 10/10/17 1619  . fentaNYL (SUBLIMAZE) injection    PRN Algernon Huxley, MD   50 mcg at 10/12/17 1526  . midazolam (VERSED) injection    PRN Algernon Huxley, MD   2 mg at 10/12/17 1526  . [MAR Hold] mometasone-formoterol (DULERA) 100-5 MCG/ACT inhaler 2 puff  2 puff Inhalation BID Nicholes Mango, MD   2 puff at 10/12/17 0847  . [MAR Hold] ondansetron (ZOFRAN) tablet 4 mg  4 mg Oral Q6H PRN Gouru, Aruna, MD       Or  . Doug Sou Hold] ondansetron (ZOFRAN) injection 4 mg  4 mg Intravenous Q6H PRN Gouru, Aruna, MD      . Doug Sou Hold] oxyCODONE (Oxy IR/ROXICODONE) immediate release tablet 10 mg  10 mg Oral TID PRN Gouru, Aruna, MD      . Doug Sou Hold] pantoprazole (PROTONIX) injection 40 mg  40 mg Intravenous Q12H Gouru, Aruna, MD   40 mg at 10/12/17 0847  . sodium chloride 0.9 % with cefUROXime (ZINACEF) ADS Med           . [MOQ Hold] sodium chloride flush (NS) 0.9 % injection 3 mL  3 mL Intravenous Q12H Gouru, Aruna, MD   3 mL at  10/11/17 2059  . [MAR Hold] sodium chloride flush (NS) 0.9 % injection 3 mL  3 mL Intravenous PRN Gouru, Aruna, MD   3 mL at 10/10/17 2229  . [MAR Hold] tiotropium (SPIRIVA) inhalation capsule 18 mcg  18 mcg Inhalation Daily Nicholes Mango, MD   18 mcg at 10/12/17 8101    Past Medical History:  Diagnosis Date  . Allergy   . Anemia   . ARF (acute respiratory failure) (HCC)    H/O  . CHF (congestive heart failure) ([place])   . COPD (chronic obstructive pulmonary disease) ([place])   . Hypertension   . Hypokalemia   . Insomnia   . Muscle contracture    MUSCLE SPASMS  . Peripheral vascular disease ([place])   . Pressure ulcer     Past Surgical History:  Procedure Laterality Date  . AMPUTATION Left 09/19/2015   Procedure: AMPUTATION BELOW KNEE;  Surgeon: Algernon Huxley, MD;  Location: [facility] ORS;  Service: Vascular;  Laterality: Left;  . AMPUTATION Left 11/01/2015   Procedure: AMPUTATION ABOVE KNEE;  Surgeon: Algernon Huxley, MD;  Location: [facility] ORS;   Service: Vascular;  Laterality: Left;  . APPLICATION OF WOUND VAC Left 10/18/2015   Procedure: APPLICATION OF WOUND VAC;  Surgeon: Algernon Huxley, MD;  Location: [facility] ORS;  Service: Vascular;  Laterality: Left;  . COLONOSCOPY WITH PROPOFOL N/A 05/25/2017   Procedure: COLONOSCOPY WITH PROPOFOL;  Surgeon: Jonathon Bellows, MD;  Location: [facility] ENDOSCOPY;  Service: Gastroenterology;  Laterality: N/A;  . ESOPHAGOGASTRODUODENOSCOPY (EGD) WITH PROPOFOL N/A 05/25/2017   Procedure: ESOPHAGOGASTRODUODENOSCOPY (EGD) WITH PROPOFOL;  Surgeon: Jonathon Bellows, MD;  Location: [facility] ENDOSCOPY;  Service: Gastroenterology;  Laterality: N/A;  . GIVENS CAPSULE STUDY N/A 07/14/2017   Procedure: GIVENS CAPSULE STUDY;  Surgeon: Jonathon Bellows, MD;  Location: [facility] ENDOSCOPY;  Service: Gastroenterology;  Laterality: N/A;  . PERIPHERAL VASCULAR CATHETERIZATION Left 01/18/2015   Procedure: Lower Extremity Angiography;  Surgeon: Algernon Huxley, MD;  Location: [place] CV LAB;  Service: Cardiovascular;  Laterality: Left;  . PERIPHERAL VASCULAR CATHETERIZATION N/A 01/18/2015   Procedure: Lower Extremity Intervention;  Surgeon: Algernon Huxley, MD;  Location: [place] CV LAB;  Service: Cardiovascular;  Laterality: N/A;  . PERIPHERAL VASCULAR CATHETERIZATION  07/30/2015   Procedure: Lower Extremity Intervention;  Surgeon: Algernon Huxley, MD;  Location: [place] CV LAB;  Service: Cardiovascular;;  . PERIPHERAL VASCULAR CATHETERIZATION N/A 07/30/2015   Procedure: Abdominal Aortogram w/Lower Extremity;  Surgeon: Algernon Huxley, MD;  Location: [place] CV LAB;  Service: Cardiovascular;  Laterality: N/A;  . PERIPHERAL VASCULAR CATHETERIZATION Left 08/22/2015   Procedure: Lower Extremity Angiography;  Surgeon: Algernon Huxley, MD;  Location: [place] CV LAB;  Service: Cardiovascular;  Laterality: Left;  . PERIPHERAL VASCULAR CATHETERIZATION Left 08/22/2015   Procedure: Lower Extremity Intervention;  Surgeon: Algernon Huxley, MD;  Location: [place] CV  LAB;  Service: Cardiovascular;  Laterality: Left;  . PERIPHERAL VASCULAR CATHETERIZATION Right 03/31/2016   Procedure: Lower Extremity Angiography;  Surgeon: Algernon Huxley, MD;  Location: [place] CV LAB;  Service: Cardiovascular;  Laterality: Right;  . PERIPHERAL VASCULAR CATHETERIZATION  03/31/2016   Procedure: Lower Extremity Intervention;  Surgeon: Algernon Huxley, MD;  Location: [place] CV LAB;  Service: Cardiovascular;;  . PERIPHERAL VASCULAR CATHETERIZATION Left 04/10/2016   Procedure: Lower Extremity Angiography;  Surgeon: Algernon Huxley, MD;  Location: [place] CV LAB;  Service: Cardiovascular;  Laterality: Left;  . WOUND DEBRIDEMENT Left 10/18/2015   Procedure: DEBRIDEMENT  WOUND   ( LEFT BKA DEBRIDEMENT );  Surgeon: Algernon Huxley, MD;  Location: [facility] ORS;  Service: Vascular;  Laterality: Left;    Social History Social History   Tobacco Use  . Smoking status: Former Smoker    Packs/day: 1.00    Years: 44.00    Pack years: 44.00    Types: Cigarettes    Last attempt to quit: 11/17/2012    Years since quitting: 4.9  . Smokeless tobacco: Never Used  Substance Use Topics  . Alcohol use: No  . Drug use: No    Family History No bleeding disorder, clotting disorder, autoimmune diseases, or aneurysms  No Known Allergies   REVIEW OF SYSTEMS (Negative unless checked)  Constitutional: [] Weight loss  [] Fever  [] Chills Cardiac: [x] Chest pain   [] Chest pressure   [] Palpitations   [] Shortness of breath when laying flat   [x] Shortness of breath at rest   [] Shortness of breath with exertion. Vascular:  [] Pain in legs with walking   [] Pain in legs at rest   [] Pain in legs when laying flat   [] Claudication   [] Pain in feet when walking  [] Pain in feet at rest  [] Pain in feet when laying flat   [x] History of DVT   [] Phlebitis   [x] Swelling in legs   [] Varicose veins   [x] Non-healing ulcers Pulmonary:   [] Uses home oxygen   [] Productive cough   [] Hemoptysis   [] Wheeze  [] COPD    [] Asthma Neurologic:  [] Dizziness  [] Blackouts   [] Seizures   [] History of stroke   [] History of TIA  [] Aphasia   [] Temporary blindness   [] Dysphagia   [] Weakness or numbness in arms   [] Weakness or numbness in legs Musculoskeletal:  [x] Arthritis   [] Joint swelling   [] Joint pain   [] Low back pain Hematologic:  [] Easy bruising  [] Easy bleeding   [] Hypercoagulable state   [x] Anemic  [] Hepatitis Gastrointestinal:  [x] Blood in stool   [] Vomiting blood  [] Gastroesophageal reflux/heartburn   [] Difficulty swallowing. Genitourinary:  [] Chronic kidney disease   [] Difficult urination  [] Frequent urination  [] Burning with urination   [] Blood in urine Skin:  [] Rashes   [x] Ulcers   [x] Wounds Psychological:  [] History of anxiety   []  History of major depression.  Physical Examination  Vitals:   10/11/17 1930 10/12/17 0337 10/12/17 0758 10/12/17 1452  BP: (!) 130/58 (!) 126/51 (!) 119/44 (!) 157/75  Pulse: 90 71 66 84  Resp: 17 18 18 16   Temp: 98.7 F (37.1 C) 98.3 F (36.8 C) 98.2 F (36.8 C) 98.3 F (36.8 C)  TempSrc: Oral Oral  Oral  SpO2: 92% 95% 94% 96%  Weight:  87.8 kg (193 lb 8 oz)  91.6 kg (202 lb)  Height:    6\' 1"  (1.854 m)   Body mass index is 26.65 kg/m. Gen:  WD/WN, NAD Head: [place]/AT, No temporalis wasting.  Ear/Nose/Throat: Hearing grossly intact, nares w/o erythema or drainage, oropharynx w/o Erythema/Exudate Eyes: Sclera non-icteric, conjunctiva clear Neck: Trachea midline.  No JVD.  Pulmonary:  Good air movement, respirations not labored, equal bilaterally.  Cardiac: RRR, normal S1, S2. Vascular:  Vessel Right Left  Radial Palpable Palpable                          PT  not palpable  not palpable  DP  not palpable  not palpable    Musculoskeletal: M/S 5/5 throughout.  Extremities without ischemic changes.  No deformity or atrophy.  1-2+ left lower  extremity edema in his amputation stump.  Mild right lower extremity edema as well.  Several ulcerations on the right  foot. Neurologic: Sensation grossly intact in extremities.  Symmetrical.  Speech is fluent. Motor exam as listed above. Psychiatric: Judgment intact, Mood & affect appropriate for pt's clinical situation.       CBC Lab Results  Component Value Date   WBC 13.5 (H) 10/12/2017   HGB 7.9 (L) 10/12/2017   HCT 26.6 (L) 10/12/2017   MCV 62.8 (L) 10/12/2017   PLT 332 10/12/2017    BMET    Component Value Date/Time   NA 140 10/11/2017 0338   NA 136 12/01/2013 1317   K 3.4 (L) 10/11/2017 0338   K 3.9 12/01/2013 1317   CL 110 10/11/2017 0338   CL 100 12/01/2013 1317   CO2 23 10/11/2017 0338   CO2 33 (H) 12/01/2013 1317   GLUCOSE 101 (H) 10/11/2017 0338   GLUCOSE 91 12/01/2013 1317   BUN 13 10/11/2017 0338   BUN 24 (H) 09/25/2014 0846   CREATININE 0.88 10/11/2017 0338   CREATININE 1.29 09/25/2014 0846   CALCIUM 8.2 (L) 10/11/2017 0338   CALCIUM 9.4 12/01/2013 1317   GFRNONAA >60 10/11/2017 0338   GFRNONAA 60 (L) 09/25/2014 0846   GFRNONAA >60 03/16/2014 0750   GFRAA >60 10/11/2017 0338   GFRAA >60 09/25/2014 0846   GFRAA >60 03/16/2014 0750   Estimated Creatinine Clearance: 92.1 mL/min (by C-G formula based on SCr of 0.88 mg/dL).  COAG Lab Results  Component Value Date   INR 2.39 10/10/2017   INR 1.39 04/10/2016   INR 1.20 10/30/2015    Radiology Dg Chest 2 View  Result Date: 10/10/2017 CLINICAL DATA:  Chest pain. EXAM: CHEST  2 VIEW COMPARISON:  November 07, 2015 FINDINGS: Chronic changes in the left apex are stable. Reticular changes in the right upper lung are mildly more prominent the interval. No pneumothorax. The cardiomediastinal silhouette is stable. No other acute abnormalities. IMPRESSION: 1. Mild reticular changes in the right upper lung are more prominent since March 2017 but may have been present at that time. This may be a progressive chronic process. However, a subtle acute infiltrate is not excluded. 2. Chronic changes in the left apex. Electronically Signed    By: Dorise Bullion III M.D   On: 10/10/2017 07:34   Ct Angio Abd/pel W/ And/or W/o  Result Date: 10/11/2017 CLINICAL DATA:  Anemia EXAM: CTA ABDOMEN AND PELVIS wITHOUT AND WITH CONTRAST TECHNIQUE: Multidetector CT imaging of the abdomen and pelvis was performed using the standard protocol during bolus administration of intravenous contrast. Multiplanar reconstructed images and MIPs were obtained and reviewed to evaluate the vascular anatomy. CONTRAST:  183mL ISOVUE-370 IOPAMIDOL (ISOVUE-370) INJECTION 76% COMPARISON:  04/10/2016 FINDINGS: VASCULAR Aorta: There is soft smooth plaque throughout the abdominal aorta. Mild atherosclerotic calcifications. No luminal narrowing. No evidence of aneurysm. Celiac: Patent. Atherosclerotic calcifications. Branch vessels patent. SMA: Origin is patent. There is smooth plaque just beyond the origin of the SMA causing mild narrowing. Branch vessels grossly patent. Renals: Single bilateral renal arteries are patent with smooth soft plaque and some atherosclerotic calcification. IMA: Diminutive and patent. Inflow: There is soft and calcified plaque in the right common iliac artery without significant narrowing. There is mild narrowing involving the proximal right external iliac artery. There is significant narrowing of the distal right external iliac artery. See table position -1 142.6. Right internal iliac artery is moderately diseased. Left common iliac artery is patent with atherosclerotic  changes. There is a stent in the left external iliac artery. The stent is patent. There is significant. This is best appreciated on coronal reformat images. See image 57 of series 10. Left internal iliac artery is moderately diseased. Narrowing just beyond the stent Proximal Outflow: Right common femoral and visualized superficial femoral arteries are grossly patent. Left common femoral artery is grossly patent. Left superficial femoral artery is occluded. Veins: No obvious DVT. Review of  the MIP images confirms the above findings. NON-VASCULAR Lower chest: There is consolidation in the posterior basal left lower lobe. Hepatobiliary: Mild diffuse hepatic steatosis. Gallbladder is unremarkable. Pancreas: Unremarkable Spleen: Unremarkable Adrenals/Urinary Tract: Kidneys and adrenal glands are within normal limits. Mild diffuse bladder wall thickening. Stomach/Bowel: There is no convincing diverticulosis scattered throughout the length of the colon is present. There is wall thickening of the rectum as well as stranding in the adjacent fat. An inflammatory process is not excluded. There is no evidence of small-bowel obstruction. Normal appendix. Stomach is decompressed. Small hiatal hernia is suspected. Duodenal C-loop is unremarkable. Focal contrast extravasation throughout the length of the colon to suggest active gastrointestinal bleeding. Lymphatic: No abnormal retroperitoneal adenopathy. Reproductive: Prostate is within normal limits. Other: There is no free fluid. The large hematoma in the proximal left thigh has largely resolved with some residual soft tissue density. Musculoskeletal: There is no vertebral compression deformity. IMPRESSION: VASCULAR Significant narrowing of the distal right external iliac artery. Significant narrowing of the distal left external iliac artery, beyond the stent. Left superficial femoral artery occlusion. Large hematoma in the left inguinal region and proximal left thigh has largely resolved with some residual chronic soft tissue density. NON-VASCULAR Patchy consolidation in the left lower lobe. Findings may represent pneumonia. Mild diffuse bladder wall thickening. Correlation with urinalysis is recommended. There is no convincing contrast extravasation within the colon to suggest active gastrointestinal bleeding. Wall thickening of the rectum with adjacent fatty stranding. Findings may represent an inflammatory process and could result in blood in stool. Correlate  clinically. Electronically Signed   By: Marybelle Killings M.D.   On: 10/11/2017 10:56      Assessment/Plan 1. DVT with GI bleed requiring cessation of anticoagulation.  Will need an IVC filter.  Is at high risk for filter thrombosis. May consider removal and restarting anticoagulation even at a lower dose at some point when bleeding resolved.  Also has severe PAD and has already had a unilateral amputation 2. PAD.  Severe. S/p multiple interventions bilaterally and an amputation.  Should continue antiplatelets if possible and maybe resume anticoagulation at a lower dose when bleeding resolved. 3. Anemia. Likely from GI bleed.  Requiring cessation of anticoagulation.  GI seeing.    [name], MD  10/12/2017 3:27 PM    This note was created with Dragon medical transcription system.  Any error is purely unintentional

## 2017-10-13 ENCOUNTER — Encounter: Payer: Self-pay | Admitting: Vascular Surgery

## 2017-10-13 DIAGNOSIS — I5032 Chronic diastolic (congestive) heart failure: Secondary | ICD-10-CM | POA: Diagnosis not present

## 2017-10-13 DIAGNOSIS — D509 Iron deficiency anemia, unspecified: Secondary | ICD-10-CM | POA: Diagnosis not present

## 2017-10-13 DIAGNOSIS — D62 Acute posthemorrhagic anemia: Secondary | ICD-10-CM | POA: Diagnosis not present

## 2017-10-13 DIAGNOSIS — D649 Anemia, unspecified: Secondary | ICD-10-CM | POA: Diagnosis not present

## 2017-10-13 DIAGNOSIS — Z7951 Long term (current) use of inhaled steroids: Secondary | ICD-10-CM | POA: Diagnosis not present

## 2017-10-13 DIAGNOSIS — K922 Gastrointestinal hemorrhage, unspecified: Secondary | ICD-10-CM | POA: Diagnosis not present

## 2017-10-13 DIAGNOSIS — I248 Other forms of acute ischemic heart disease: Secondary | ICD-10-CM | POA: Diagnosis not present

## 2017-10-13 DIAGNOSIS — K921 Melena: Secondary | ICD-10-CM | POA: Diagnosis not present

## 2017-10-13 DIAGNOSIS — I82409 Acute embolism and thrombosis of unspecified deep veins of unspecified lower extremity: Secondary | ICD-10-CM | POA: Diagnosis not present

## 2017-10-13 DIAGNOSIS — D6862 Lupus anticoagulant syndrome: Secondary | ICD-10-CM | POA: Diagnosis not present

## 2017-10-13 DIAGNOSIS — R079 Chest pain, unspecified: Secondary | ICD-10-CM | POA: Diagnosis not present

## 2017-10-13 DIAGNOSIS — K2970 Gastritis, unspecified, without bleeding: Secondary | ICD-10-CM | POA: Diagnosis not present

## 2017-10-13 DIAGNOSIS — J189 Pneumonia, unspecified organism: Secondary | ICD-10-CM | POA: Diagnosis not present

## 2017-10-13 DIAGNOSIS — I82512 Chronic embolism and thrombosis of left femoral vein: Secondary | ICD-10-CM | POA: Diagnosis not present

## 2017-10-13 LAB — CBC
HEMATOCRIT: 27.8 % — AB (ref 40.0–52.0)
Hemoglobin: 8.2 g/dL — ABNORMAL LOW (ref 13.0–18.0)
MCH: 18.6 pg — ABNORMAL LOW (ref 26.0–34.0)
MCHC: 29.5 g/dL — AB (ref 32.0–36.0)
MCV: 63.1 fL — AB (ref 80.0–100.0)
PLATELETS: 353 10*3/uL (ref 150–440)
RBC: 4.4 MIL/uL (ref 4.40–5.90)
RDW: 34.3 % — AB (ref 11.5–14.5)
WBC: 14.2 10*3/uL — AB (ref 3.8–10.6)

## 2017-10-13 MED ORDER — PEG 3350-KCL-NA BICARB-NACL 420 G PO SOLR
4000.0000 mL | Freq: Once | ORAL | Status: AC
  Administered 2017-10-13: 4000 mL via ORAL
  Filled 2017-10-13: qty 4000

## 2017-10-13 MED ORDER — SODIUM CHLORIDE 0.9 % IV SOLN
INTRAVENOUS | Status: DC
  Administered 2017-10-14: 16:00:00 via INTRAVENOUS

## 2017-10-13 MED ORDER — SENNOSIDES-DOCUSATE SODIUM 8.6-50 MG PO TABS
1.0000 | ORAL_TABLET | Freq: Two times a day (BID) | ORAL | Status: DC
  Administered 2017-10-14 – 2017-10-15 (×2): 1 via ORAL
  Filled 2017-10-13 (×4): qty 1

## 2017-10-13 NOTE — Progress Notes (Signed)
   Jonathon Bellows , MD [address], [place], [place], [place], [address] [address], [place], [place], [place], [address] Phone: [phone]  Fax: [phone]   Timothy Juarez is being followed for anemia    Subjective: Breathing better, no issues   Objective: Vital signs in last 24 hours: Vitals:   10/12/17 1642 10/12/17 1946 10/13/17 0350 10/13/17 0758  BP: (!) 122/59 (!) 125/50 119/65 125/81  Pulse: 70 88 78 77  Resp: 18 19 19    Temp:  98 F (36.7 C) 98.8 F (37.1 C) 98.6 F (37 C)  TempSrc:   Oral Oral  SpO2: 97% 96% 99% 95%  Weight:   189 lb 4.8 oz (85.9 kg)   Height:       Weight change: 8 lb 8 oz (3.856 kg)  Intake/Output Summary (Last 24 hours) at 10/13/2017 1549 Last data filed at 10/13/2017 1358 Gross per 24 hour  Intake 720 ml  Output -  Net 720 ml     Exam: Heart:: Regular rate and rhythm, S1S2 present or without murmur or extra heart sounds Lungs: normal, clear to auscultation and clear to auscultation and percussion Abdomen: soft, nontender, normal bowel sounds   Lab Results: @LABTEST2 @ Micro Results: Recent Results (from the past 240 hour(s))  MRSA PCR Screening     Status: None   Collection Time: 10/10/17 10:05 AM  Result Value Ref Range Status   MRSA by PCR NEGATIVE NEGATIVE Final    Comment:        The GeneXpert MRSA Assay (FDA approved for NASAL specimens only), is one component of a comprehensive MRSA colonization surveillance program. It is not intended to diagnose MRSA infection nor to guide or monitor treatment for MRSA infections. Performed at [facility], [address], [place], [place] [address]    Studies/Results: No results found. Medications: I have reviewed the patient's current medications. Scheduled Meds: . azithromycin  250 mg Oral Daily  . fentaNYL  12.5 mcg Transdermal Q72H  . mometasone-formoterol  2 puff Inhalation BID  . pantoprazole (PROTONIX) IV  40 mg Intravenous Q12H  .  senna-docusate  1 tablet Oral BID  . sodium chloride flush  3 mL Intravenous Q12H  . tiotropium  18 mcg Inhalation Daily   Continuous Infusions: PRN Meds:.acetaminophen **OR** acetaminophen, albuterol, ondansetron **OR** ondansetron (ZOFRAN) IV, oxyCODONE, sodium chloride flush   Assessment: Active Problems:   Symptomatic anemia   Gastrointestinal hemorrhage   Chronic deep vein thrombosis (DVT) of left lower extremity (HCC)  Timothy L Burnetteis a 67 y.o.y/o malewith recent workup including EGD(gastric intestinal metaplasia), colonoscopy(multiple polyps seen ), and small bowel capsule(incomplete study ) for iron deficiency anemia, presents with chest pain and noted to have hemoglobin of 5.3 while on Xarelto . Presently being treated with a pneumonia. Received an IVC filter.     Plan: 1. Monitor CBC and transfuse 2. Check B12, obtain IV iron  3. EGD+ colonoscopy tomorrow.   I have discussed alternative options, risks & benefits,  which include, but are not limited to, bleeding, infection, perforation,respiratory complication & drug reaction.  The patient agrees with this plan & written consent will be obtained.         LOS: 3 days   Jonathon Bellows, MD 10/13/2017, 3:49 PM

## 2017-10-13 NOTE — Plan of Care (Signed)
Encourage patient to use recliner while and awake and eating meals.

## 2017-10-13 NOTE — Clinical Social Work Note (Signed)
Clinical Social Work Assessment  Patient Details  Name: Timothy Juarez MRN: [id number] Date of Birth: 1951-07-29  Date of referral:  10/13/17               Reason for consult:  Facility Placement                Permission sought to share information with:  Facility Sport and exercise psychologist, Family Supports Permission granted to share information::  Yes, Verbal Permission Granted  Name::     Timothy, Juarez Daughter [phone] or Timothy, Juarez [phone]   Agency::  SNF admissions  Relationship::     Contact Information:     Housing/Transportation Living arrangements for the past 2 months:  [place] of Information:  Patient Patient Interpreter Needed:  None Criminal Activity/Legal Involvement Pertinent to Current Situation/Hospitalization:  No - Comment as needed Significant Relationships:  Adult Children Lives with:  Facility Resident Do you feel safe going back to the place where you live?  Yes Need for family participation in patient care:  No (Coment)  Care giving concerns:  Patient did not express any concerns about returning to [facility].   Social Worker assessment / plan:  Patient is a 67 year old male who is a long term care resident at [facility].  Patient is alert and oriented x4, patient states that he has been at [facility] for a few years, and does not have any complaints regarding SNF.  Patient was explained role of CSW and process for facilitating him to return back to SNF.  Patient expressed understanding, and gave CSW permission to facilitate discharge planning.  Patient did not have any other questions or concerns.  CSW was given permission to send information to SNF.  Employment status:  Retired Forensic scientist:  Medicare PT Recommendations:  [place] / Referral to community resources:  [place]  Patient/Family's Response to care:  Patient is in agreement to returning  back to SNF.  Patient/Family's Understanding of and Emotional Response to Diagnosis, Current Treatment, and Prognosis:  Patient was calm and relaxed, patient was watching TV, he is aware of current treatment plan based on him telling CSW what procedure will be done tomorrow.  Emotional Assessment Appearance:  Appears stated age Attitude/Demeanor/Rapport:    Affect (typically observed):  Appropriate, Calm Orientation:  Oriented to Self, Oriented to Place, Oriented to  Time, Oriented to Situation Alcohol / Substance use:  Not Applicable Psych involvement (Current and /or in the community):  No (Comment)  Discharge Needs  Concerns to be addressed:  Care Coordination Readmission within the last 30 days:  No Current discharge risk:  None Barriers to Discharge:  Continued Medical Work up   Anell Barr 10/13/2017, 5:48 PM

## 2017-10-13 NOTE — NC FL2 (Addendum)
[place] LEVEL OF CARE SCREENING TOOL     IDENTIFICATION  Patient Name: Timothy Juarez Birthdate: 1951/06/25 Sex: male Admission Date (Current Location): 10/10/2017  [place] and [place] Number:  Timothy Juarez  [id number] [place] and Address:  [facility], [address] [place], [place] [address]      Provider Number: [id number]  Attending Physician Name and Address:  Nicholes Mango, MD  Relative Name and Phone Number:  Lendell, Gallick Daughter [phone] or Gatsby, Chismar [phone]     Current Level of Care: Hospital Recommended Level of Care: [place] Prior Approval Number:    Date Approved/Denied:   PASRR Number:    Discharge Plan: SNF    Current Diagnoses: Patient Active Problem List   Diagnosis Date Noted  . Symptomatic anemia 10/10/2017  . Gastrointestinal hemorrhage   . Chronic deep vein thrombosis (DVT) of left lower extremity ([place])   . Iron deficiency anemia 04/21/2017  . Hypertension 03/13/2017  . Recurrent deep vein thrombosis (DVT) ([place]) 03/11/2017  . Hyperlipidemia 11/04/2016  . Pressure ulcer 04/11/2016  . Pseudoaneurysm of left femoral artery ([place]) 04/11/2016  . PAD (peripheral artery disease) ([place]) 04/10/2016  . Atherosclerosis of native arteries of extremities with gangrene, left leg ([place]) 11/01/2015  . Status post below knee amputation ([place]) 09/20/2015  . Ischemia of lower extremity 09/14/2015  . Atherosclerotic peripheral vascular disease with ulceration ([place]) 01/18/2015    Orientation RESPIRATION BLADDER Height & Weight     Self, Time, Situation, Place  Normal Continent Weight: 189 lb 4.8 oz (85.9 kg) Height:  6\' 1"  (185.4 cm)  BEHAVIORAL SYMPTOMS/MOOD NEUROLOGICAL BOWEL NUTRITION STATUS      Continent Diet  AMBULATORY STATUS COMMUNICATION OF NEEDS Skin   Extensive Assist Verbally Surgical wounds                       Personal Care Assistance Level of Assistance   Bathing, Feeding, Dressing Bathing Assistance: Limited assistance Feeding assistance: Independent Dressing Assistance: Limited assistance     Functional Limitations Info  Sight, Hearing, Speech Sight Info: Adequate Hearing Info: Adequate Speech Info: Adequate    SPECIAL CARE FACTORS FREQUENCY                       Contractures Contractures Info: Not present    Additional Factors Info  Code Status, Allergies Code Status Info: Full Code Allergies Info: NKA           Current Medications (10/13/2017):  This is the current hospital active medication list Current Facility-Administered Medications  Medication Dose Route Frequency Provider Last Rate Last Dose  . 0.9 %  sodium chloride infusion   Intravenous Continuous Jonathon Bellows, MD      . acetaminophen (TYLENOL) tablet 650 mg  650 mg Oral Q6H PRN Gouru, Aruna, MD       Or  . acetaminophen (TYLENOL) suppository 650 mg  650 mg Rectal Q6H PRN Gouru, Aruna, MD      . albuterol (PROVENTIL) (2.5 MG/3ML) 0.083% nebulizer solution 2.5 mg  2.5 mg Nebulization Q4H PRN Gouru, Aruna, MD      . azithromycin (ZITHROMAX) tablet 250 mg  250 mg Oral Daily Gouru, Aruna, MD   250 mg at 10/13/17 0804  . fentaNYL (DURAGESIC - dosed mcg/hr) 12.5 mcg  12.5 mcg Transdermal Q72H Gouru, Aruna, MD   12.5 mcg at 10/13/17 1252  . mometasone-formoterol (DULERA) 100-5 MCG/ACT inhaler 2 puff  2 puff  Inhalation BID Nicholes Mango, MD   2 puff at 10/13/17 0803  . ondansetron (ZOFRAN) tablet 4 mg  4 mg Oral Q6H PRN Gouru, Aruna, MD       Or  . ondansetron (ZOFRAN) injection 4 mg  4 mg Intravenous Q6H PRN Gouru, Aruna, MD      . oxyCODONE (Oxy IR/ROXICODONE) immediate release tablet 10 mg  10 mg Oral TID PRN Gouru, Aruna, MD      . pantoprazole (PROTONIX) injection 40 mg  40 mg Intravenous Q12H Gouru, Aruna, MD   40 mg at 10/13/17 0804  . polyethylene glycol-electrolytes (NuLYTELY/GoLYTELY) solution 4,000 mL  4,000 mL Oral Once Jonathon Bellows, MD   4,000 mL at  10/13/17 1716  . senna-docusate (Senokot-S) tablet 1 tablet  1 tablet Oral BID Gouru, Aruna, MD      . sodium chloride flush (NS) 0.9 % injection 3 mL  3 mL Intravenous Q12H Gouru, Aruna, MD   3 mL at 10/13/17 0811  . sodium chloride flush (NS) 0.9 % injection 3 mL  3 mL Intravenous PRN Gouru, Aruna, MD   3 mL at 10/10/17 2229  . tiotropium (SPIRIVA) inhalation capsule 18 mcg  18 mcg Inhalation Daily Gouru, Illene Silver, MD   18 mcg at 10/13/17 0804     Discharge Medications: Please see discharge summary for a list of discharge medications.  Relevant Imaging Results:  Relevant Lab Results:   Additional Information [id number]  Ross Ludwig, LCSWA

## 2017-10-13 NOTE — Progress Notes (Signed)
[place] [facility] [place] NAME: Timothy Juarez    MR#:  [id number]  DATE OF BIRTH:  Jun 28, 1951  SUBJECTIVE:  CHIEF COMPLAINT: Is resting comfortably.  Had IVC  filter placement 2/25   Patient with no complaints  REVIEW OF SYSTEMS:  CONSTITUTIONAL: No fever, fatigue or weakness.  EYES: No blurred or double vision.  EARS, NOSE, AND THROAT: No tinnitus or ear pain.  RESPIRATORY: No cough, shortness of breath, wheezing or hemoptysis.  CARDIOVASCULAR: No chest pain, orthopnea, edema.  GASTROINTESTINAL: No nausea, vomiting, diarrhea or abdominal pain.  GENITOURINARY: No dysuria, hematuria.  ENDOCRINE: No polyuria, nocturia,  HEMATOLOGY: No anemia, easy bruising or bleeding SKIN: No rash or lesion. MUSCULOSKELETAL: No joint pain or arthritis.   NEUROLOGIC: No tingling, numbness, weakness.  PSYCHIATRY: No anxiety or depression.   DRUG ALLERGIES:  No Known Allergies  VITALS:  Blood pressure 125/81, pulse 77, temperature 98.6 F (37 C), temperature source Oral, resp. rate 19, height 6\' 1"  (1.854 m), weight 85.9 kg (189 lb 4.8 oz), SpO2 95 %.  PHYSICAL EXAMINATION:  GENERAL:  67 y.o.-year-old patient lying in the bed with no acute distress.  EYES: Pupils equal, round, reactive to light and accommodation. No scleral icterus. Extraocular muscles intact.  HEENT: Head atraumatic, normocephalic. Oropharynx and nasopharynx clear.  NECK:  Supple, no jugular venous distention. No thyroid enlargement, no tenderness.  LUNGS: Normal breath sounds bilaterally, no wheezing, rales,rhonchi or crepitation. No use of accessory muscles of respiration.  CARDIOVASCULAR: S1, S2 normal. No murmurs, rubs, or gallops.  ABDOMEN: Soft, nontender, nondistended. Bowel sounds present. No organomegaly or mass.  EXTREMITIES: No pedal edema, cyanosis, or clubbing.  NEUROLOGIC: Cranial nerves II through XII are intact. Muscle strength 5/5 in all extremities. Sensation  intact. Gait not checked.  PSYCHIATRIC: The patient is alert and oriented x 3.  SKIN: No obvious rash, lesion, or ulcer.    LABORATORY PANEL:   CBC Recent Labs  Lab 10/13/17 0955  WBC 14.2*  HGB 8.2*  HCT 27.8*  PLT 353   ------------------------------------------------------------------------------------------------------------------  Chemistries  Recent Labs  Lab 10/11/17 0338  NA 140  K 3.4*  CL 110  CO2 23  GLUCOSE 101*  BUN 13  CREATININE 0.88  CALCIUM 8.2*  AST 16  ALT 8*  ALKPHOS 89  BILITOT 2.0*   ------------------------------------------------------------------------------------------------------------------  Cardiac Enzymes Recent Labs  Lab 10/11/17 1555  TROPONINI 0.25*   ------------------------------------------------------------------------------------------------------------------  RADIOLOGY:  No results found.  EKG:   Orders placed or performed during the hospital encounter of 10/10/17  . ED EKG within 10 minutes  . ED EKG within 10 minutes  . EKG 12-Lead  . EKG 12-Lead    ASSESSMENT AND PLAN:    #Symptomatic anemia-acute on chronic Hemodynamically stable at this time Received 2 units of blood transfusion on 10/10/2017 Hemoglobin 5.3-8.2-7.8-7.9--7.8-7.9-8.2 Monitor hemoglobin and hematocrit, transfuse as needed Discontinuing Xarelto and aspirin at this time Protonix CT angiogram of the abdomen and pelvis with narrowing of the bilateral external iliac arteries and resolving hematoma in the left pelvis, rectal wall thickening which could cause blood in the stool Appreciate GI recommendations discussed with Dr. Vicente Males might consider scoping the patient tomorrow Patient recently had a EGD, capsule study by gastroenterology Stool guaiac was positive in the emergency department  #History of recurrent DVTs Hold Xarelto and aspirin at this time Oncology is following As patient is at high risk for recurrent DVTs vascular surgery is  consulted , had  IVC  filter placement 2/25  #Chest pain could be demand ischemia Monitor patient on telemetry, patient is a symptomatic during my examination Cycle cardiac biomarkers 0.03-4.56-0.44 Echocardiogram-60-65% ejection fraction Cardiology recommending no interventions at this time as it is a demand ischemia   #Community acquired pneumonia Chest x-ray with possible infiltrate and patient is reporting chest pain which could be pleuritic Rocephin and azithromycin given initially and eventually Rocephin discontinued and continue azithromycin for a total of 5 days and sputum culture and sensitivity if patient can give a sample Supportive treatment   #Peripheral arterial disease left BKA Sees Dr. Lucky Cowboy as an outpatient Holding Xarelto and aspirin at this time    DVT reflexes with SCDs GI prophylaxis with Protonix     All the records are reviewed and case discussed with Care Management/Social Workerr. Management plans discussed with the patient, family and they are in agreement.  CODE STATUS: FC   TOTAL TIME TAKING CARE OF THIS PATIENT: 36 minutes.   POSSIBLE D/C IN 1-2  DAYS, DEPENDING ON CLINICAL CONDITION.  Note: This dictation was prepared with Dragon dictation along with smaller phrase technology. Any transcriptional errors that result from this process are unintentional.   Nicholes Mango M.D on 10/13/2017 at 3:00 PM  Between 7am to 6pm - Pager - [phone] After 6pm go to [email or web address] - password [email or web address] [facility]  Office  [phone]  CC: Primary care physician; Alvester Morin, MD

## 2017-10-13 NOTE — Progress Notes (Signed)
Hematology/Oncology Progress Note [facility] [phone] [phone]  Patient Care Team: Alvester Morin, MD as PCP - General (Family Medicine)   Name of the patient: Timothy Juarez  [id number]  [birth year]  Date of visit: 10/13/17   INTERVAL HISTORY Patient was seen and examined at bedside. Feels better, no chest pain. S/p IVC filter. Hemoglobin stable.    Review of systems- Review of Systems  Constitutional: Positive for malaise/fatigue. Negative for chills and fever.  HENT: Negative for hearing loss, nosebleeds and tinnitus.   Eyes: Negative for photophobia and pain.  Respiratory: Negative for cough and sputum production.   Cardiovascular: Negative for chest pain and palpitations.  Gastrointestinal: Negative for blood in stool, constipation, diarrhea, nausea and vomiting.  Genitourinary: Negative for dysuria.  Musculoskeletal: Negative for myalgias.  Skin: Negative for rash.  Neurological: Negative for dizziness, tingling, sensory change and weakness.  Endo/Heme/Allergies: Does not bruise/bleed easily.  Psychiatric/Behavioral: Negative for hallucinations and substance abuse.    No Known Allergies  Patient Active Problem List   Diagnosis Date Noted  . Symptomatic anemia 10/10/2017  . Gastrointestinal hemorrhage   . Chronic deep vein thrombosis (DVT) of left lower extremity ([place])   . Iron deficiency anemia 04/21/2017  . Hypertension 03/13/2017  . Recurrent deep vein thrombosis (DVT) ([place]) 03/11/2017  . Hyperlipidemia 11/04/2016  . Pressure ulcer 04/11/2016  . Pseudoaneurysm of left femoral artery ([place]) 04/11/2016  . PAD (peripheral artery disease) ([place]) 04/10/2016  . Atherosclerosis of native arteries of extremities with gangrene, left leg ([place]) 11/01/2015  . Status post below knee amputation ([place]) 09/20/2015  . Ischemia of lower extremity 09/14/2015  . Atherosclerotic peripheral vascular disease with ulceration  ([place]) 01/18/2015     Past Medical History:  Diagnosis Date  . Allergy   . Anemia   . ARF (acute respiratory failure) (HCC)    H/O  . CHF (congestive heart failure) ([place])   . COPD (chronic obstructive pulmonary disease) ([place])   . Hypertension   . Hypokalemia   . Insomnia   . Muscle contracture    MUSCLE SPASMS  . Peripheral vascular disease ([place])   . Pressure ulcer      Past Surgical History:  Procedure Laterality Date  . AMPUTATION Left 09/19/2015   Procedure: AMPUTATION BELOW KNEE;  Surgeon: Algernon Huxley, MD;  Location: [facility] ORS;  Service: Vascular;  Laterality: Left;  . AMPUTATION Left 11/01/2015   Procedure: AMPUTATION ABOVE KNEE;  Surgeon: Algernon Huxley, MD;  Location: [facility] ORS;  Service: Vascular;  Laterality: Left;  . APPLICATION OF WOUND VAC Left 10/18/2015   Procedure: APPLICATION OF WOUND VAC;  Surgeon: Algernon Huxley, MD;  Location: [facility] ORS;  Service: Vascular;  Laterality: Left;  . COLONOSCOPY WITH PROPOFOL N/A 05/25/2017   Procedure: COLONOSCOPY WITH PROPOFOL;  Surgeon: Jonathon Bellows, MD;  Location: [facility] ENDOSCOPY;  Service: Gastroenterology;  Laterality: N/A;  . ESOPHAGOGASTRODUODENOSCOPY (EGD) WITH PROPOFOL N/A 05/25/2017   Procedure: ESOPHAGOGASTRODUODENOSCOPY (EGD) WITH PROPOFOL;  Surgeon: Jonathon Bellows, MD;  Location: [facility] ENDOSCOPY;  Service: Gastroenterology;  Laterality: N/A;  . GIVENS CAPSULE STUDY N/A 07/14/2017   Procedure: GIVENS CAPSULE STUDY;  Surgeon: Jonathon Bellows, MD;  Location: [facility] ENDOSCOPY;  Service: Gastroenterology;  Laterality: N/A;  . IVC FILTER INSERTION N/A 10/12/2017   Procedure: IVC FILTER INSERTION;  Surgeon: Algernon Huxley, MD;  Location: [place] CV LAB;  Service: Cardiovascular;  Laterality: N/A;  . PERIPHERAL VASCULAR CATHETERIZATION Left 01/18/2015   Procedure: Lower Extremity Angiography;  Surgeon: Corene Cornea  Bunnie Domino, MD;  Location: [place] CV LAB;  Service: Cardiovascular;  Laterality: Left;  . PERIPHERAL VASCULAR CATHETERIZATION N/A 01/18/2015    Procedure: Lower Extremity Intervention;  Surgeon: Algernon Huxley, MD;  Location: [place] CV LAB;  Service: Cardiovascular;  Laterality: N/A;  . PERIPHERAL VASCULAR CATHETERIZATION  07/30/2015   Procedure: Lower Extremity Intervention;  Surgeon: Algernon Huxley, MD;  Location: [place] CV LAB;  Service: Cardiovascular;;  . PERIPHERAL VASCULAR CATHETERIZATION N/A 07/30/2015   Procedure: Abdominal Aortogram w/Lower Extremity;  Surgeon: Algernon Huxley, MD;  Location: [place] [facility] LAB;  Service: Cardiovascular;  Laterality: N/A;  . PERIPHERAL VASCULAR CATHETERIZATION Left 08/22/2015   Procedure: Lower Extremity Angiography;  Surgeon: Algernon Huxley, MD;  Location: [place] CV LAB;  Service: Cardiovascular;  Laterality: Left;  . PERIPHERAL VASCULAR CATHETERIZATION Left 08/22/2015   Procedure: Lower Extremity Intervention;  Surgeon: Algernon Huxley, MD;  Location: [place] CV LAB;  Service: Cardiovascular;  Laterality: Left;  . PERIPHERAL VASCULAR CATHETERIZATION Right 03/31/2016   Procedure: Lower Extremity Angiography;  Surgeon: Algernon Huxley, MD;  Location: [place] CV LAB;  Service: Cardiovascular;  Laterality: Right;  . PERIPHERAL VASCULAR CATHETERIZATION  03/31/2016   Procedure: Lower Extremity Intervention;  Surgeon: Algernon Huxley, MD;  Location: [place] CV LAB;  Service: Cardiovascular;;  . PERIPHERAL VASCULAR CATHETERIZATION Left 04/10/2016   Procedure: Lower Extremity Angiography;  Surgeon: Algernon Huxley, MD;  Location: [place] [facility] LAB;  Service: Cardiovascular;  Laterality: Left;  . WOUND DEBRIDEMENT Left 10/18/2015   Procedure: DEBRIDEMENT WOUND   ( LEFT BKA DEBRIDEMENT );  Surgeon: Algernon Huxley, MD;  Location: [facility] ORS;  Service: Vascular;  Laterality: Left;    Social History   Socioeconomic History  . Marital status: Legally Separated    Spouse name: Not on file  . Number of children: Not on file  . Years of education: Not on file  . Highest education level: Not on file    Social Needs  . Financial resource strain: Not on file  . Food insecurity - worry: Not on file  . Food insecurity - inability: Not on file  . Transportation needs - medical: Not on file  . Transportation needs - non-medical: Not on file  Occupational History  . Not on file  Tobacco Use  . Smoking status: Former Smoker    Packs/day: 1.00    Years: 44.00    Pack years: 44.00    Types: Cigarettes    Last attempt to quit: 11/17/2012    Years since quitting: 4.9  . Smokeless tobacco: Never Used  Substance and Sexual Activity  . Alcohol use: No  . Drug use: No  . Sexual activity: No  Other Topics Concern  . Not on file  Social History Narrative  . Not on file     History reviewed. No pertinent family history.   Current Facility-Administered Medications:  .  0.9 %  sodium chloride infusion, , Intravenous, Continuous, Jonathon Bellows, MD .  acetaminophen (TYLENOL) tablet 650 mg, 650 mg, Oral, Q6H PRN **OR** acetaminophen (TYLENOL) suppository 650 mg, 650 mg, Rectal, Q6H PRN, Gouru, Aruna, MD .  albuterol (PROVENTIL) (2.5 MG/3ML) 0.083% nebulizer solution 2.5 mg, 2.5 mg, Nebulization, Q4H PRN, Gouru, Aruna, MD .  [COMPLETED] azithromycin (ZITHROMAX) tablet 500 mg, 500 mg, Oral, Daily, 500 mg at 10/10/17 1230 **FOLLOWED BY** azithromycin (ZITHROMAX) tablet 250 mg, 250 mg, Oral, Daily, Gouru, Aruna, MD, 250 mg at 10/13/17 0804 .  fentaNYL (  [place] - dosed mcg/hr) 12.5 mcg, 12.5 mcg, Transdermal, Q72H, Gouru, Aruna, MD, 12.5 mcg at 10/13/17 1252 .  mometasone-formoterol (DULERA) 100-5 MCG/ACT inhaler 2 puff, 2 puff, Inhalation, BID, Gouru, Aruna, MD, 2 puff at 10/13/17 0803 .  ondansetron (ZOFRAN) tablet 4 mg, 4 mg, Oral, Q6H PRN **OR** ondansetron (ZOFRAN) injection 4 mg, 4 mg, Intravenous, Q6H PRN, Gouru, Aruna, MD .  oxyCODONE (Oxy IR/ROXICODONE) immediate release tablet 10 mg, 10 mg, Oral, TID PRN, Gouru, Aruna, MD .  pantoprazole (PROTONIX) injection 40 mg, 40 mg, Intravenous, Q12H,  Gouru, Aruna, MD, 40 mg at 10/13/17 0804 .  polyethylene glycol-electrolytes (NuLYTELY/GoLYTELY) solution 4,000 mL, 4,000 mL, Oral, Once, Jonathon Bellows, MD .  senna-docusate (Senokot-S) tablet 1 tablet, 1 tablet, Oral, BID, Gouru, Aruna, MD .  sodium chloride flush (NS) 0.9 % injection 3 mL, 3 mL, Intravenous, Q12H, Gouru, Aruna, MD, 3 mL at 10/13/17 0811 .  sodium chloride flush (NS) 0.9 % injection 3 mL, 3 mL, Intravenous, PRN, Gouru, Aruna, MD, 3 mL at 10/10/17 2229 .  tiotropium (SPIRIVA) inhalation capsule 18 mcg, 18 mcg, Inhalation, Daily, Gouru, Aruna, MD, 18 mcg at 10/13/17 0804   Physical exam:  Vitals:   10/12/17 1642 10/12/17 1946 10/13/17 0350 10/13/17 0758  BP: (!) 122/59 (!) 125/50 119/65 125/81  Pulse: 70 88 78 77  Resp: 18 19 19    Temp:  98 F (36.7 C) 98.8 F (37.1 C) 98.6 F (37 C)  TempSrc:   Oral Oral  SpO2: 97% 96% 99% 95%  Weight:   189 lb 4.8 oz (85.9 kg)   Height:        GENERAL: No distress, well nourished.  SKIN: No rashes or significant lesions  HEAD: Normocephalic, No masses, lesions, tenderness or abnormalities  EYES: Conjunctiva arepale, non icteric ENT: External ears normal ,lips, buccal mucosa, and tongue normal and mucous membranes are moist  LYMPH: No palpable cervical and axillary lymphadenopathy  LUNGS: Clear to auscultation, no crackles or wheezes HEART: Regular rate &rhythm, no murmurs, no gallops, S1 normal and S2 normal  ABDOMEN: Abdomen soft, non-tender, normal bowel sounds, MUSCULOSKELETAL: No CVA tenderness and no tenderness on percussion of the back or rib cage. EXTREMITIES: left above knee amputation stump non tenderor swelling.No edema at right lower extremity.  NEURO: Alert & oriented, no focal motor/sensory deficits.       CMP Latest Ref Rng & Units 10/11/2017  Glucose 65 - 99 mg/dL 101(H)  BUN 6 - 20 mg/dL 13  Creatinine 0.61 - 1.24 mg/dL 0.88  Sodium 135 - 145 mmol/L 140  Potassium 3.5 - 5.1 mmol/L 3.4(L)  Chloride  101 - 111 mmol/L 110  CO2 22 - 32 mmol/L 23  Calcium 8.9 - 10.3 mg/dL 8.2(L)  Total Protein 6.5 - 8.1 g/dL 6.9  Total Bilirubin 0.3 - 1.2 mg/dL 2.0(H)  Alkaline Phos 38 - 126 U/L 89  AST 15 - 41 U/L 16  ALT 17 - 63 U/L 8(L)   CBC Latest Ref Rng & Units 10/13/2017  WBC 3.8 - 10.6 K/uL 14.2(H)  Hemoglobin 13.0 - 18.0 g/dL 8.2(L)  Hematocrit 40.0 - 52.0 % 27.8(L)  Platelets 150 - 440 K/uL 353     Dg Chest 2 View  Result Date: 10/10/2017 CLINICAL DATA:  Chest pain. EXAM: CHEST  2 VIEW COMPARISON:  November 07, 2015 FINDINGS: Chronic changes in the left apex are stable. Reticular changes in the right upper lung are mildly more prominent the interval. No pneumothorax. The cardiomediastinal silhouette is stable.  No other acute abnormalities. IMPRESSION: 1. Mild reticular changes in the right upper lung are more prominent since March 2017 but may have been present at that time. This may be a progressive chronic process. However, a subtle acute infiltrate is not excluded. 2. Chronic changes in the left apex. Electronically Signed   By: Dorise Bullion III M.D   On: 10/10/2017 07:34   Ct Angio Abd/pel W/ And/or W/o  Result Date: 10/11/2017 CLINICAL DATA:  Anemia EXAM: CTA ABDOMEN AND PELVIS wITHOUT AND WITH CONTRAST TECHNIQUE: Multidetector CT imaging of the abdomen and pelvis was performed using the standard protocol during bolus administration of intravenous contrast. Multiplanar reconstructed images and MIPs were obtained and reviewed to evaluate the vascular anatomy. CONTRAST:  11mL ISOVUE-370 IOPAMIDOL (ISOVUE-370) INJECTION 76% COMPARISON:  04/10/2016 FINDINGS: VASCULAR Aorta: There is soft smooth plaque throughout the abdominal aorta. Mild atherosclerotic calcifications. No luminal narrowing. No evidence of aneurysm. Celiac: Patent. Atherosclerotic calcifications. Branch vessels patent. SMA: Origin is patent. There is smooth plaque just beyond the origin of the SMA causing mild narrowing. Branch  vessels grossly patent. Renals: Single bilateral renal arteries are patent with smooth soft plaque and some atherosclerotic calcification. IMA: Diminutive and patent. Inflow: There is soft and calcified plaque in the right common iliac artery without significant narrowing. There is mild narrowing involving the proximal right external iliac artery. There is significant narrowing of the distal right external iliac artery. See table position -1 142.6. Right internal iliac artery is moderately diseased. Left common iliac artery is patent with atherosclerotic changes. There is a stent in the left external iliac artery. The stent is patent. There is significant. This is best appreciated on coronal reformat images. See image 57 of series 10. Left internal iliac artery is moderately diseased. Narrowing just beyond the stent Proximal Outflow: Right common femoral and visualized superficial femoral arteries are grossly patent. Left common femoral artery is grossly patent. Left superficial femoral artery is occluded. Veins: No obvious DVT. Review of the MIP images confirms the above findings. NON-VASCULAR Lower chest: There is consolidation in the posterior basal left lower lobe. Hepatobiliary: Mild diffuse hepatic steatosis. Gallbladder is unremarkable. Pancreas: Unremarkable Spleen: Unremarkable Adrenals/Urinary Tract: Kidneys and adrenal glands are within normal limits. Mild diffuse bladder wall thickening. Stomach/Bowel: There is no convincing diverticulosis scattered throughout the length of the colon is present. There is wall thickening of the rectum as well as stranding in the adjacent fat. An inflammatory process is not excluded. There is no evidence of small-bowel obstruction. Normal appendix. Stomach is decompressed. Small hiatal hernia is suspected. Duodenal C-loop is unremarkable. Focal contrast extravasation throughout the length of the colon to suggest active gastrointestinal bleeding. Lymphatic: No abnormal  retroperitoneal adenopathy. Reproductive: Prostate is within normal limits. Other: There is no free fluid. The large hematoma in the proximal left thigh has largely resolved with some residual soft tissue density. Musculoskeletal: There is no vertebral compression deformity. IMPRESSION: VASCULAR Significant narrowing of the distal right external iliac artery. Significant narrowing of the distal left external iliac artery, beyond the stent. Left superficial femoral artery occlusion. Large hematoma in the left inguinal region and proximal left thigh has largely resolved with some residual chronic soft tissue density. NON-VASCULAR Patchy consolidation in the left lower lobe. Findings may represent pneumonia. Mild diffuse bladder wall thickening. Correlation with urinalysis is recommended. There is no convincing contrast extravasation within the colon to suggest active gastrointestinal bleeding. Wall thickening of the rectum with adjacent fatty stranding. Findings may represent an inflammatory  process and could result in blood in stool. Correlate clinically. Electronically Signed   By: Marybelle Killings M.D.   On: 10/11/2017 10:56    Assessment and plan- Patient is a 67 y.o. male with history of recurrent DVT on Xarelto 20mg  daily, peripheral artery disease, history of iron deficiency anemia, gastritis, high grade dysplastic colon presented with chest pain, symptomatic anemia, positive stool occult.   # Symptomatic anemia:s/p PRBC transfusion and one dose of IV venofer.   Hemoglobin improved appropriately and remains stable. I will check his folate and b12 level as well.    # Iron panel is consistent with iron deficiency, s/p IV Venofer x 1. Will follow up with him for additional IV vernofer outpatient.  # endoscopy colonoscopy tomorrow.    # Recurrent DVT: he is at high risk of recurrent DVT, anticoagulation. S/p IVC filter. .  Dr.Guru, thank you for allowing me to participate in the care of this  patient  Earlie Server, MD, PhD Hematology [place] at [facility] Pager- [phone] 10/13/2017

## 2017-10-14 ENCOUNTER — Encounter: Payer: Self-pay | Admitting: *Deleted

## 2017-10-14 ENCOUNTER — Inpatient Hospital Stay: Payer: Medicare Other | Admitting: Anesthesiology

## 2017-10-14 ENCOUNTER — Encounter
Admission: EM | Disposition: A | Discharge status: Skilled Nursing Facility | Payer: Self-pay | Source: Home / Self Care | Attending: Internal Medicine

## 2017-10-14 DIAGNOSIS — D509 Iron deficiency anemia, unspecified: Secondary | ICD-10-CM | POA: Diagnosis not present

## 2017-10-14 DIAGNOSIS — D124 Benign neoplasm of descending colon: Secondary | ICD-10-CM

## 2017-10-14 DIAGNOSIS — Z7951 Long term (current) use of inhaled steroids: Secondary | ICD-10-CM | POA: Diagnosis not present

## 2017-10-14 DIAGNOSIS — D649 Anemia, unspecified: Secondary | ICD-10-CM | POA: Diagnosis not present

## 2017-10-14 DIAGNOSIS — K921 Melena: Secondary | ICD-10-CM | POA: Diagnosis not present

## 2017-10-14 DIAGNOSIS — I82409 Acute embolism and thrombosis of unspecified deep veins of unspecified lower extremity: Secondary | ICD-10-CM | POA: Diagnosis not present

## 2017-10-14 DIAGNOSIS — I739 Peripheral vascular disease, unspecified: Secondary | ICD-10-CM | POA: Diagnosis not present

## 2017-10-14 DIAGNOSIS — I248 Other forms of acute ischemic heart disease: Secondary | ICD-10-CM | POA: Diagnosis not present

## 2017-10-14 DIAGNOSIS — K635 Polyp of colon: Secondary | ICD-10-CM | POA: Diagnosis not present

## 2017-10-14 DIAGNOSIS — Z13810 Encounter for screening for upper gastrointestinal disorder: Secondary | ICD-10-CM | POA: Diagnosis not present

## 2017-10-14 DIAGNOSIS — R079 Chest pain, unspecified: Secondary | ICD-10-CM | POA: Diagnosis not present

## 2017-10-14 DIAGNOSIS — D62 Acute posthemorrhagic anemia: Secondary | ICD-10-CM | POA: Diagnosis not present

## 2017-10-14 DIAGNOSIS — I110 Hypertensive heart disease with heart failure: Secondary | ICD-10-CM | POA: Diagnosis not present

## 2017-10-14 DIAGNOSIS — J449 Chronic obstructive pulmonary disease, unspecified: Secondary | ICD-10-CM | POA: Diagnosis not present

## 2017-10-14 DIAGNOSIS — I5032 Chronic diastolic (congestive) heart failure: Secondary | ICD-10-CM | POA: Diagnosis not present

## 2017-10-14 DIAGNOSIS — I509 Heart failure, unspecified: Secondary | ICD-10-CM | POA: Diagnosis not present

## 2017-10-14 DIAGNOSIS — E785 Hyperlipidemia, unspecified: Secondary | ICD-10-CM | POA: Diagnosis not present

## 2017-10-14 DIAGNOSIS — J189 Pneumonia, unspecified organism: Secondary | ICD-10-CM | POA: Diagnosis not present

## 2017-10-14 HISTORY — PX: ESOPHAGOGASTRODUODENOSCOPY (EGD) WITH PROPOFOL: SHX5813

## 2017-10-14 HISTORY — PX: COLONOSCOPY WITH PROPOFOL: SHX5780

## 2017-10-14 LAB — FOLATE: FOLATE: 8.3 ng/mL (ref >5.9)

## 2017-10-14 LAB — CBC
HCT: 27 % — ABNORMAL LOW (ref 40.0–52.0)
Hemoglobin: 8.1 g/dL — ABNORMAL LOW (ref 13.0–18.0)
MCH: 18.9 pg — AB (ref 26.0–34.0)
MCHC: 29.9 g/dL — AB (ref 32.0–36.0)
MCV: 63.3 fL — ABNORMAL LOW (ref 80.0–100.0)
PLATELETS: 343 10*3/uL (ref 150–440)
RBC: 4.27 MIL/uL — AB (ref 4.40–5.90)
RDW: 35.7 % — ABNORMAL HIGH (ref 11.5–14.5)
WBC: 13.2 10*3/uL — ABNORMAL HIGH (ref 3.8–10.6)

## 2017-10-14 LAB — VITAMIN B12: Vitamin B-12: 274 pg/mL (ref 180–914)

## 2017-10-14 SURGERY — ESOPHAGOGASTRODUODENOSCOPY (EGD) WITH PROPOFOL
Anesthesia: General

## 2017-10-14 MED ORDER — LIDOCAINE HCL (CARDIAC) 20 MG/ML IV SOLN
INTRAVENOUS | Status: DC | PRN
  Administered 2017-10-14: 40 mg via INTRAVENOUS

## 2017-10-14 MED ORDER — PROPOFOL 500 MG/50ML IV EMUL
INTRAVENOUS | Status: DC | PRN
  Administered 2017-10-14: 150 ug/kg/min via INTRAVENOUS

## 2017-10-14 MED ORDER — PROPOFOL 10 MG/ML IV BOLUS
INTRAVENOUS | Status: DC | PRN
  Administered 2017-10-14: 50 mg via INTRAVENOUS
  Administered 2017-10-14: 100 mg via INTRAVENOUS

## 2017-10-14 MED ORDER — SODIUM CHLORIDE 0.9 % IV SOLN
INTRAVENOUS | Status: DC
  Administered 2017-10-14: 15:00:00 via INTRAVENOUS

## 2017-10-14 NOTE — Transfer of Care (Signed)
Immediate Anesthesia Transfer of Care Note  Patient: Timothy Juarez  Procedure(s) Performed: Procedure(s): ESOPHAGOGASTRODUODENOSCOPY (EGD) WITH PROPOFOL (N/A) COLONOSCOPY WITH PROPOFOL (N/A)  Patient Location: PACU and Endoscopy Unit  Anesthesia Type:General  Level of Consciousness: sedated  Airway & Oxygen Therapy: Patient Spontanous Breathing and Patient connected to nasal cannula oxygen  Post-op Assessment: Report given to RN and Post -op Vital signs reviewed and stable  Post vital signs: Reviewed and stable  Last Vitals:  Vitals:   10/14/17 1440 10/14/17 1633  BP: (!) 138/52 134/74  Pulse: 72 89  Resp: 20 (!) 29  Temp: (!) 36.2 C (!) 36.3 C  SpO2: 56% 15%    Complications: No apparent anesthesia complications

## 2017-10-14 NOTE — Anesthesia Postprocedure Evaluation (Signed)
Anesthesia Post Note  Patient: Timothy Juarez  Procedure(s) Performed: ESOPHAGOGASTRODUODENOSCOPY (EGD) WITH PROPOFOL (N/A ) COLONOSCOPY WITH PROPOFOL (N/A )  Patient location during evaluation: Endoscopy Anesthesia Type: General Level of consciousness: awake and alert Pain management: pain level controlled Vital Signs Assessment: post-procedure vital signs reviewed and stable Respiratory status: spontaneous breathing and respiratory function stable Cardiovascular status: stable Anesthetic complications: no     Last Vitals:  Vitals:   10/14/17 1440 10/14/17 1633  BP: (!) 138/52 134/74  Pulse: 72 89  Resp: 20 (!) 29  Temp: (!) 36.2 C (!) 36.3 C  SpO2: 99% 96%    Last Pain:  Vitals:   10/14/17 1633  TempSrc: Temporal  PainSc:                  KEPHART,WILLIAM K

## 2017-10-14 NOTE — Anesthesia Procedure Notes (Signed)
Date/Time: 10/14/2017 4:01 PM Performed by: Doreen Salvage, CRNA Pre-anesthesia Checklist: Patient identified, Emergency Drugs available, Suction available and Patient being monitored Patient Re-evaluated:Patient Re-evaluated prior to induction Oxygen Delivery Method: Nasal cannula Induction Type: IV induction Dental Injury: Teeth and Oropharynx as per pre-operative assessment  Comments: Nasal cannula with etCO2 monitoring

## 2017-10-14 NOTE — Anesthesia Preprocedure Evaluation (Signed)
Anesthesia Evaluation  Patient identified by MRN, date of birth, ID band Patient awake    Reviewed: Allergy & Precautions, NPO status , Patient's Chart, lab work & pertinent test results  History of Anesthesia Complications Negative for: history of anesthetic complications  Airway Mallampati: III       Dental  (+) Missing, Chipped, Poor Dentition   Pulmonary neg shortness of breath, neg sleep apnea, COPD,  COPD inhaler, former smoker,           Cardiovascular hypertension, Pt. on home beta blockers and Pt. on medications + Peripheral Vascular Disease and +CHF (LVEF 60-65%)  (-) Past MI (-) dysrhythmias (-) Valvular Problems/Murmurs     Neuro/Psych neg Seizures    GI/Hepatic Neg liver ROS, neg GERD  ,  Endo/Other  neg diabetes  Renal/GU negative Renal ROS     Musculoskeletal   Abdominal   Peds  Hematology  (+) anemia ,   Anesthesia Other Findings   Reproductive/Obstetrics                             Anesthesia Physical Anesthesia Plan  ASA: III  Anesthesia Plan: General   Post-op Pain Management:    Induction: Intravenous  PONV Risk Score and Plan: 2 and Propofol infusion and TIVA  Airway Management Planned: Nasal Cannula  Additional Equipment:   Intra-op Plan:   Post-operative Plan:   Informed Consent: I have reviewed the patients History and Physical, chart, labs and discussed the procedure including the risks, benefits and alternatives for the proposed anesthesia with the patient or authorized representative who has indicated his/her understanding and acceptance.     Plan Discussed with:   Anesthesia Plan Comments:         Anesthesia Quick Evaluation

## 2017-10-14 NOTE — Care Management Important Message (Signed)
Important Message  Patient Details  Name: Timothy Juarez MRN: [id number] Date of Birth: [birth year]   Medicare Important Message Given:  Yes  Signed IM notice given   Katrina Stack, RN 10/14/2017, 3:02 PM

## 2017-10-14 NOTE — Care Management (Signed)
Patient is scheduled for EGD and colonoscopy around 3:30 pm. Anticipate return to skilled nursing within next twenty four hours

## 2017-10-14 NOTE — Op Note (Signed)
[facility] Gastroenterology Patient Name: Timothy Juarez Procedure Date: 10/14/2017 3:51 PM MRN: [id number] Account #: [id number] Date of Birth: 11/01/1950 Admit Type: Inpatient Age: 67 Room: [facility] ENDO ROOM 1 Gender: Male Note Status: Finalized Procedure:            Upper GI endoscopy Indications:          Iron deficiency anemia Providers:            Jonathon Bellows MD, MD Medicines:            Monitored Anesthesia Care Complications:        No immediate complications. Procedure:            Pre-Anesthesia Assessment:                       - Prior to the procedure, a History and Physical was                        performed, and patient medications, allergies and                        sensitivities were reviewed. The patient's tolerance of                        previous anesthesia was reviewed.                       - The risks and benefits of the procedure and the                        sedation options and risks were discussed with the                        patient. All questions were answered and informed                        consent was obtained.                       - ASA Grade Assessment: III - A patient with severe                        systemic disease.                       After obtaining informed consent, the endoscope was                        passed under direct vision. Throughout the procedure,                        the patient's blood pressure, pulse, and oxygen                        saturations were monitored continuously. The Endoscope                        was introduced through the mouth, and advanced to the                        third part of duodenum. The upper GI endoscopy was  accomplished with ease. The patient tolerated the                        procedure well. Findings:      The esophagus was normal.      The stomach was normal.      The examined duodenum was normal. Impression:           - Normal  esophagus.                       - Normal stomach.                       - Normal examined duodenum.                       - No specimens collected. Recommendation:       - Perform a colonoscopy today. Procedure Code(s):    --- Professional ---                       [phone], Esophagogastroduodenoscopy, flexible, transoral;                        diagnostic, including collection of specimen(s) by                        brushing or washing, when performed (separate procedure) Diagnosis Code(s):    --- Professional ---                       D50.9, Iron deficiency anemia, unspecified CPT copyright 2016 [employer]. All rights reserved. The codes documented in this report are preliminary and upon coder review may  be revised to meet current compliance requirements. Jonathon Bellows, MD Jonathon Bellows MD, MD 10/14/2017 4:10:04 PM This report has been signed electronically. Number of Addenda: 0 Note Initiated On: 10/14/2017 3:51 PM      [facility]

## 2017-10-14 NOTE — Op Note (Signed)
[facility] Gastroenterology Patient Name: Timothy Juarez Procedure Date: 10/14/2017 3:50 PM MRN: [id number] Account #: [id number] Date of Birth: 08-28-1950 Admit Type: Inpatient Age: 67 Room: [facility] ENDO ROOM 1 Gender: Male Note Status: Finalized Procedure:            Colonoscopy Indications:          Iron deficiency anemia Providers:            Jonathon Bellows MD, MD Medicines:            Monitored Anesthesia Care Complications:        No immediate complications. Procedure:            Pre-Anesthesia Assessment:                       - Prior to the procedure, a History and Physical was                        performed, and patient medications, allergies and                        sensitivities were reviewed. The patient's tolerance of                        previous anesthesia was reviewed.                       - The risks and benefits of the procedure and the                        sedation options and risks were discussed with the                        patient. All questions were answered and informed                        consent was obtained.                       - ASA Grade Assessment: III - A patient with severe                        systemic disease.                       After obtaining informed consent, the colonoscope was                        passed under direct vision. Throughout the procedure,                        the patient's blood pressure, pulse, and oxygen                        saturations were monitored continuously. The                        Colonoscope was introduced through the anus and                        advanced to the the cecum, identified by the  appendiceal orifice, IC valve and transillumination.                        The colonoscopy was performed with ease. The patient                        tolerated the procedure well. The quality of the bowel                        preparation was poor. Findings:     The perianal and digital rectal examinations were normal.      A 7 mm polyp was found in the descending colon. The polyp was sessile.       The polyp was removed with a cold snare. Resection and retrieval were       complete.      The exam was otherwise without abnormality on direct and retroflexion       views.      No blood seen in the colon Impression:           - Preparation of the colon was poor.                       - One 7 mm polyp in the descending colon, removed with                        a cold snare. Resected and retrieved.                       - The examination was otherwise normal on direct and                        retroflexion views. Recommendation:       - Return patient to hospital ward for ongoing care.                       - Advance diet as tolerated.                       - Continue present medications.                       - Await pathology results.                       - Repeat colonoscopy in 6 months because the bowel                        preparation was suboptimal.                       - 1. Proceed with 12 hour capsule study as an                        outpatient with bowel prep of golytely 209ml day prior                        due to incomplete prior study                       2. Advance diet and when stable can be discharged. Not  bleeding at this time. Procedure Code(s):    --- Professional ---                       [phone], Colonoscopy, flexible; with removal of tumor(s),                        polyp(s), or other lesion(s) by snare technique Diagnosis Code(s):    --- Professional ---                       D12.4, Benign neoplasm of descending colon                       D50.9, Iron deficiency anemia, unspecified CPT copyright 2016 American Medical Association. All rights reserved. The codes documented in this report are preliminary and upon coder review may  be revised to meet current compliance requirements. Jonathon Bellows,  MD Jonathon Bellows MD, MD 10/14/2017 4:29:40 PM This report has been signed electronically. Number of Addenda: 0 Note Initiated On: 10/14/2017 3:50 PM Scope Withdrawal Time: 0 hours 11 minutes 27 seconds  Total Procedure Duration: 0 hours 15 minutes 14 seconds       [facility]

## 2017-10-14 NOTE — Anesthesia Post-op Follow-up Note (Signed)
Anesthesia QCDR form completed.        

## 2017-10-14 NOTE — H&P (Signed)
Jonathon Bellows, MD [address] [place], [place], [place], [address] [address], [place], [place], [place], [address] Phone: [phone]  Fax: [phone]  Primary Care Physician:  Alvester Morin, MD   Pre-Procedure History & Physical: HPI:  JULIAS MOULD is a 67 y.o. male is here for an endoscopy and colonoscopy    Past Medical History:  Diagnosis Date  . Allergy   . Anemia   . ARF (acute respiratory failure) (HCC)    H/O  . CHF (congestive heart failure) ([place])   . COPD (chronic obstructive pulmonary disease) ([place])   . Hypertension   . Hypokalemia   . Insomnia   . Muscle contracture    MUSCLE SPASMS  . Peripheral vascular disease ([place])   . Pressure ulcer     Past Surgical History:  Procedure Laterality Date  . AMPUTATION Left 09/19/2015   Procedure: AMPUTATION BELOW KNEE;  Surgeon: Algernon Huxley, MD;  Location: [facility] ORS;  Service: Vascular;  Laterality: Left;  . AMPUTATION Left 11/01/2015   Procedure: AMPUTATION ABOVE KNEE;  Surgeon: Algernon Huxley, MD;  Location: [facility] ORS;  Service: Vascular;  Laterality: Left;  . APPLICATION OF WOUND VAC Left 10/18/2015   Procedure: APPLICATION OF WOUND VAC;  Surgeon: Algernon Huxley, MD;  Location: [facility] ORS;  Service: Vascular;  Laterality: Left;  . COLONOSCOPY WITH PROPOFOL N/A 05/25/2017   Procedure: COLONOSCOPY WITH PROPOFOL;  Surgeon: Jonathon Bellows, MD;  Location: [facility] ENDOSCOPY;  Service: Gastroenterology;  Laterality: N/A;  . ESOPHAGOGASTRODUODENOSCOPY (EGD) WITH PROPOFOL N/A 05/25/2017   Procedure: ESOPHAGOGASTRODUODENOSCOPY (EGD) WITH PROPOFOL;  Surgeon: Jonathon Bellows, MD;  Location: [facility] ENDOSCOPY;  Service: Gastroenterology;  Laterality: N/A;  . GIVENS CAPSULE STUDY N/A 07/14/2017   Procedure: GIVENS CAPSULE STUDY;  Surgeon: Jonathon Bellows, MD;  Location: [facility] ENDOSCOPY;  Service: Gastroenterology;  Laterality: N/A;  . IVC FILTER INSERTION N/A 10/12/2017   Procedure: IVC FILTER INSERTION;  Surgeon: Algernon Huxley,  MD;  Location: [place] CV LAB;  Service: Cardiovascular;  Laterality: N/A;  . PERIPHERAL VASCULAR CATHETERIZATION Left 01/18/2015   Procedure: Lower Extremity Angiography;  Surgeon: Algernon Huxley, MD;  Location: [place] CV LAB;  Service: Cardiovascular;  Laterality: Left;  . PERIPHERAL VASCULAR CATHETERIZATION N/A 01/18/2015   Procedure: Lower Extremity Intervention;  Surgeon: Algernon Huxley, MD;  Location: [place] CV LAB;  Service: Cardiovascular;  Laterality: N/A;  . PERIPHERAL VASCULAR CATHETERIZATION  07/30/2015   Procedure: Lower Extremity Intervention;  Surgeon: Algernon Huxley, MD;  Location: [place] CV LAB;  Service: Cardiovascular;;  . PERIPHERAL VASCULAR CATHETERIZATION N/A 07/30/2015   Procedure: Abdominal Aortogram w/Lower Extremity;  Surgeon: Algernon Huxley, MD;  Location: [place] CV LAB;  Service: Cardiovascular;  Laterality: N/A;  . PERIPHERAL VASCULAR CATHETERIZATION Left 08/22/2015   Procedure: Lower Extremity Angiography;  Surgeon: Algernon Huxley, MD;  Location: [place] CV LAB;  Service: Cardiovascular;  Laterality: Left;  . PERIPHERAL VASCULAR CATHETERIZATION Left 08/22/2015   Procedure: Lower Extremity Intervention;  Surgeon: Algernon Huxley, MD;  Location: [place] CV LAB;  Service: Cardiovascular;  Laterality: Left;  . PERIPHERAL VASCULAR CATHETERIZATION Right 03/31/2016   Procedure: Lower Extremity Angiography;  Surgeon: Algernon Huxley, MD;  Location: [place] CV LAB;  Service: Cardiovascular;  Laterality: Right;  . PERIPHERAL VASCULAR CATHETERIZATION  03/31/2016   Procedure: Lower Extremity Intervention;  Surgeon: Algernon Huxley, MD;  Location: [place] CV LAB;  Service: Cardiovascular;;  .  PERIPHERAL VASCULAR CATHETERIZATION Left 04/10/2016   Procedure: Lower Extremity Angiography;  Surgeon: Algernon Huxley, MD;  Location: [place] CV LAB;  Service: Cardiovascular;  Laterality: Left;  . WOUND DEBRIDEMENT Left 10/18/2015   Procedure: DEBRIDEMENT WOUND   (  LEFT BKA DEBRIDEMENT );  Surgeon: Algernon Huxley, MD;  Location: [facility] ORS;  Service: Vascular;  Laterality: Left;    Prior to Admission medications   Medication Sig Start Date End Date Taking? Authorizing Provider  aspirin EC 81 MG EC tablet Take 1 tablet (81 mg total) by mouth daily. 09/24/15  Yes Dew, Erskine Squibb, MD  baclofen (LIORESAL) 10 MG tablet Take 10 mg by mouth 3 (three) times daily. Hold for sedation   Yes [provider]  budesonide-formoterol (SYMBICORT) 80-4.5 MCG/ACT inhaler Inhale 2 puffs into the lungs 2 (two) times daily.   Yes [provider]  fentaNYL (DURAGESIC - DOSED MCG/HR) 12 MCG/HR Place 12 mcg onto the skin every 3 (three) days.  10/28/16  Yes [provider]  furosemide (LASIX) 40 MG tablet Take 40 mg by mouth 2 (two) times daily.    Yes [provider]  gabapentin (NEURONTIN) 100 MG capsule Take 100 mg by mouth at bedtime.   Yes [provider]  guaiFENesin (ROBITUSSIN) 100 MG/5ML SOLN Take 15 mLs by mouth 3 (three) times daily as needed for cough or to loosen phlegm.   Yes [provider]  Melatonin 5 MG CAPS Take 1 capsule by mouth at bedtime as needed.   Yes [provider]  metoprolol tartrate (LOPRESSOR) 25 MG tablet Take 0.5 tablets (12.5 mg total) by mouth 2 (two) times daily. 09/24/15  Yes Dew, Erskine Squibb, MD  nitroGLYCERIN (NITROSTAT) 0.4 MG SL tablet Place 0.14 mg under the tongue every 5 (five) minutes as needed for chest pain.   Yes [provider]  Omega-3 Fatty Acids (FISH OIL) 1000 MG CAPS Take 1,000 mg by mouth every morning.   Yes [provider]  Oxycodone HCl 10 MG TABS Take 10 mg by mouth 3 (three) times daily.   Yes [provider]  potassium chloride (K-DUR) 10 MEQ tablet Take 20 mEq by mouth daily.    Yes [provider]  ranitidine (ZANTAC) 150 MG capsule Take 150 mg by mouth every evening.   Yes [provider]  tiotropium (SPIRIVA) 18 MCG inhalation  capsule Place 18 mcg into inhaler and inhale daily.   Yes [provider]  XARELTO 20 MG TABS tablet Take 20 mg by mouth daily with supper.  04/16/17  Yes [provider]  acetaminophen (TYLENOL) 325 MG tablet Take 650 mg by mouth at bedtime.     [provider]  enoxaparin (LOVENOX) 100 MG/ML injection  05/23/17   [provider]    Allergies as of 10/10/2017  . (No Known Allergies)    History reviewed. No pertinent family history.  Social History   Socioeconomic History  . Marital status: Legally Separated    Spouse name: Not on file  . Number of children: Not on file  . Years of education: Not on file  . Highest education level: Not on file  Social Needs  . Financial resource strain: Not on file  . Food insecurity - worry: Not on file  . Food insecurity - inability: Not on file  . Transportation needs - medical: Not on file  . Transportation needs - non-medical: Not on file  Occupational History  . Not on file  Tobacco Use  . Smoking status: Former Smoker    Packs/day: 1.00    Years: 44.00    Pack years: 44.00    Types: Cigarettes    Last attempt to quit: 11/17/2012    Years since quitting: 4.9  . Smokeless tobacco: Never Used  Substance and Sexual Activity  . Alcohol use: No  . Drug use: No  . Sexual activity: No  Other Topics Concern  . Not on file  Social History Narrative  . Not on file    Review of Systems: See HPI, otherwise negative ROS  Physical Exam: BP (!) 138/52   Pulse 72   Temp (!) 97.2 F (36.2 C)   Resp 20   Ht 6\' 1"  (1.854 m)   Wt 190 lb (86.2 kg)   SpO2 99%   BMI 25.07 kg/m  General:   Alert,  pleasant and cooperative in NAD Head:  Normocephalic and atraumatic. Neck:  Supple; no masses or thyromegaly. Lungs:  Clear throughout to auscultation, normal respiratory effort.    Heart:  +S1, +S2, Regular rate and rhythm, No edema. Abdomen:  Soft, nontender and nondistended. Normal bowel sounds, without  guarding, and without rebound.   Neurologic:  Alert and  oriented x4;  grossly normal neurologically.  Impression/Plan: Mikel Cella is here for an endoscopy and colonoscopy  to be performed for  evaluation of iron deficiency anemia    Risks, benefits, limitations, and alternatives regarding endoscopy have been reviewed with the patient.  Questions have been answered.  All parties agreeable.   Jonathon Bellows, MD  10/14/2017, 3:02 PM

## 2017-10-14 NOTE — Progress Notes (Signed)
[facility] NAME: Timothy Juarez    MR#:  [id number]  DATE OF BIRTH:  14-Dec-1950  SUBJECTIVE:  CHIEF COMPLAINT: Is resting comfortably.  Had IVC  filter placement 2/25   Patient had colonoscopy today tolerated procedure well  REVIEW OF SYSTEMS:  CONSTITUTIONAL: No fever, fatigue or weakness.  EYES: No blurred or double vision.  EARS, NOSE, AND THROAT: No tinnitus or ear pain.  RESPIRATORY: No cough, shortness of breath, wheezing or hemoptysis.  CARDIOVASCULAR: No chest pain, orthopnea, edema.  GASTROINTESTINAL: No nausea, vomiting, diarrhea or abdominal pain.  GENITOURINARY: No dysuria, hematuria.  ENDOCRINE: No polyuria, nocturia,  HEMATOLOGY: No anemia, easy bruising or bleeding SKIN: No rash or lesion. MUSCULOSKELETAL: No joint pain or arthritis.   NEUROLOGIC: No tingling, numbness, weakness.  PSYCHIATRY: No anxiety or depression.   DRUG ALLERGIES:  No Known Allergies  VITALS:  Blood pressure (!) 131/53, pulse 65, temperature 98 F (36.7 C), temperature source Oral, resp. rate 15, height 6\' 1"  (1.854 m), weight 86.2 kg (190 lb), SpO2 98 %.  PHYSICAL EXAMINATION:  GENERAL:  67 y.o.-year-old patient lying in the bed with no acute distress.  EYES: Pupils equal, round, reactive to light and accommodation. No scleral icterus. Extraocular muscles intact.  HEENT: Head atraumatic, normocephalic. Oropharynx and nasopharynx clear.  NECK:  Supple, no jugular venous distention. No thyroid enlargement, no tenderness.  LUNGS: Normal breath sounds bilaterally, no wheezing, rales,rhonchi or crepitation. No use of accessory muscles of respiration.  CARDIOVASCULAR: S1, S2 normal. No murmurs, rubs, or gallops.  ABDOMEN: Soft, nontender, nondistended. Bowel sounds present. No organomegaly or mass.  EXTREMITIES: No pedal edema, cyanosis, or clubbing.  NEUROLOGIC: Cranial nerves II through XII are intact. Muscle strength 5/5 in all  extremities. Sensation intact. Gait not checked.  PSYCHIATRIC: The patient is alert and oriented x 3.  SKIN: No obvious rash, lesion, or ulcer.    LABORATORY PANEL:   CBC Recent Labs  Lab 10/14/17 0846  WBC 13.2*  HGB 8.1*  HCT 27.0*  PLT 343   ------------------------------------------------------------------------------------------------------------------  Chemistries  Recent Labs  Lab 10/11/17 0338  NA 140  K 3.4*  CL 110  CO2 23  GLUCOSE 101*  BUN 13  CREATININE 0.88  CALCIUM 8.2*  AST 16  ALT 8*  ALKPHOS 89  BILITOT 2.0*   ------------------------------------------------------------------------------------------------------------------  Cardiac Enzymes Recent Labs  Lab 10/11/17 1555  TROPONINI 0.25*   ------------------------------------------------------------------------------------------------------------------  RADIOLOGY:  No results found.  EKG:   Orders placed or performed during the hospital encounter of 10/10/17  . ED EKG within 10 minutes  . ED EKG within 10 minutes  . EKG 12-Lead  . EKG 12-Lead    ASSESSMENT AND PLAN:    #Symptomatic anemia-acute on chronic Hemodynamically stable at this time Stool guaiac was positive in the emergency department Received 2 units of blood transfusion on 10/10/2017 Hemoglobin 5.3-8.2-7.8-7.9--7.8-7.9-8.2 Monitor hemoglobin and hematocrit, transfuse as needed Discontinuing Xarelto and aspirin at this time Protonix CT angiogram of the abdomen and pelvis with narrowing of the bilateral external iliac arteries and resolving hematoma in the left pelvis, rectal wall thickening which could cause blood in the stool Appreciate GI recommendations discussed with Dr. Vicente Males  Patient  had a EGD today which is normal.  Colonoscopy with suboptimal preparation, 7 mm sessile descending colon polyp was removed.  Recommending outpatient capsule study and repeat colonoscopy by gastroenterology Advance diet as tolerated and  anticipating discharge tomorrow  #History of recurrent  DVTs Hold Xarelto and aspirin at this time Oncology is following As patient is at high risk for recurrent DVTs vascular surgery is consulted , had  IVC filter placement 2/25  #Chest pain could be demand ischemia Monitor patient on telemetry, patient is a symptomatic during my examination Cycle cardiac biomarkers 0.03-4.56-0.44 Echocardiogram-60-65% ejection fraction Cardiology recommending no interventions at this time as it is a demand ischemia   #Community acquired pneumonia Chest x-ray with possible infiltrate and patient is reporting chest pain which could be pleuritic Rocephin and azithromycin given initially and eventually Rocephin discontinued and continue azithromycin for a total of 5 days and sputum culture and sensitivity if patient can give a sample Supportive treatment   #Peripheral arterial disease left BKA Sees Dr. Lucky Cowboy as an outpatient Holding Xarelto and aspirin at this time    DVT reflexes with SCDs GI prophylaxis with Protonix     All the records are reviewed and case discussed with Care Management/Social Workerr. Management plans discussed with the patient, family and they are in agreement.  CODE STATUS: FC   TOTAL TIME TAKING CARE OF THIS PATIENT: 36 minutes.   POSSIBLE D/C IN 1- DAYS, DEPENDING ON CLINICAL CONDITION.  Note: This dictation was prepared with Dragon dictation along with smaller phrase technology. Any transcriptional errors that result from this process are unintentional.   Nicholes Mango M.D on 10/14/2017 at 6:31 PM  Between 7am to 6pm - Pager - [phone] After 6pm go to [email or web address] - password [email or web address] [facility]  Office  [phone]  CC: Primary care physician; Alvester Morin, MD

## 2017-10-15 ENCOUNTER — Encounter: Payer: Self-pay | Admitting: Gastroenterology

## 2017-10-15 DIAGNOSIS — D649 Anemia, unspecified: Secondary | ICD-10-CM | POA: Diagnosis not present

## 2017-10-15 DIAGNOSIS — S78112A Complete traumatic amputation at level between left hip and knee, initial encounter: Secondary | ICD-10-CM | POA: Diagnosis not present

## 2017-10-15 DIAGNOSIS — Z7951 Long term (current) use of inhaled steroids: Secondary | ICD-10-CM | POA: Diagnosis not present

## 2017-10-15 DIAGNOSIS — I82409 Acute embolism and thrombosis of unspecified deep veins of unspecified lower extremity: Secondary | ICD-10-CM | POA: Diagnosis not present

## 2017-10-15 DIAGNOSIS — I248 Other forms of acute ischemic heart disease: Secondary | ICD-10-CM | POA: Diagnosis not present

## 2017-10-15 DIAGNOSIS — Z7401 Bed confinement status: Secondary | ICD-10-CM | POA: Diagnosis not present

## 2017-10-15 DIAGNOSIS — K921 Melena: Secondary | ICD-10-CM | POA: Diagnosis not present

## 2017-10-15 DIAGNOSIS — K922 Gastrointestinal hemorrhage, unspecified: Secondary | ICD-10-CM | POA: Diagnosis not present

## 2017-10-15 DIAGNOSIS — J189 Pneumonia, unspecified organism: Secondary | ICD-10-CM | POA: Diagnosis not present

## 2017-10-15 DIAGNOSIS — D62 Acute posthemorrhagic anemia: Secondary | ICD-10-CM | POA: Diagnosis not present

## 2017-10-15 DIAGNOSIS — I5032 Chronic diastolic (congestive) heart failure: Secondary | ICD-10-CM | POA: Diagnosis not present

## 2017-10-15 LAB — CBC
HEMATOCRIT: 25.4 % — AB (ref 40.0–52.0)
Hemoglobin: 7.6 g/dL — ABNORMAL LOW (ref 13.0–18.0)
MCH: 19.2 pg — AB (ref 26.0–34.0)
MCHC: 30.1 g/dL — AB (ref 32.0–36.0)
MCV: 63.9 fL — AB (ref 80.0–100.0)
Platelets: 324 10*3/uL (ref 150–440)
RBC: 3.98 MIL/uL — ABNORMAL LOW (ref 4.40–5.90)
RDW: 36.1 % — ABNORMAL HIGH (ref 11.5–14.5)
WBC: 10.4 10*3/uL (ref 3.8–10.6)

## 2017-10-15 MED ORDER — CYANOCOBALAMIN 1000 MCG/ML IJ SOLN
1000.0000 ug | Freq: Once | INTRAMUSCULAR | Status: AC
  Administered 2017-10-15: 1000 ug via INTRAMUSCULAR
  Filled 2017-10-15: qty 1

## 2017-10-15 MED ORDER — CYANOCOBALAMIN 1000 MCG PO TABS: 1000.0000 ug | ORAL_TABLET | Freq: Every day | ORAL | 0 refills | Status: DC

## 2017-10-15 MED ORDER — VITAMIN B-12 1000 MCG PO TABS: 1000.0000 ug | ORAL_TABLET | Freq: Every day | ORAL | Status: DC

## 2017-10-15 MED ORDER — FENTANYL 12 MCG/HR TD PT72: 12.0000 ug | MEDICATED_PATCH | TRANSDERMAL | 0 refills | Status: DC

## 2017-10-15 MED ORDER — FUROSEMIDE 40 MG PO TABS: 40.0000 mg | ORAL_TABLET | Freq: Every day | ORAL | Status: DC

## 2017-10-15 MED ORDER — OXYCODONE HCL 10 MG PO TABS: 10.0000 mg | ORAL_TABLET | Freq: Three times a day (TID) | ORAL | 0 refills | Status: DC | PRN

## 2017-10-15 NOTE — Plan of Care (Signed)
Encourage activity.

## 2017-10-15 NOTE — Progress Notes (Signed)
Discharge report called to Jackelyn Poling, at [facility] verbalized an understanding/ iv and tele removed/ EMS called to transport

## 2017-10-15 NOTE — Discharge Summary (Signed)
[facility] [place] NAME: Timothy Juarez    MR#:  [id number]  DATE OF BIRTH:  April 13, 1951  DATE OF ADMISSION:  10/10/2017 ADMITTING PHYSICIAN: Nicholes Mango, MD  DATE OF DISCHARGE: 10/15/2017  PRIMARY CARE PHYSICIAN: Alvester Morin, MD    ADMISSION DIAGNOSIS:  Symptomatic anemia [D64.9] Gastrointestinal hemorrhage, unspecified gastrointestinal hemorrhage type [K92.2]  DISCHARGE DIAGNOSIS:  Active Problems:   Symptomatic anemia   Gastrointestinal hemorrhage   Chronic deep vein thrombosis (DVT) of left lower extremity ([place])   SECONDARY DIAGNOSIS:   Past Medical History:  Diagnosis Date  . Allergy   . Anemia   . ARF (acute respiratory failure) ([facility])    H/O  . CHF (congestive heart failure) ([place])   . COPD (chronic obstructive pulmonary disease) ([place])   . Hypertension   . Hypokalemia   . Insomnia   . Muscle contracture    MUSCLE SPASMS  . Peripheral vascular disease ([place])   . Pressure ulcer     HOSPITAL COURSE:   1.  Acute on chronic blood loss anemia which was symptomatic.  The patient received 2 units of packed red blood cells and hemoglobin has improved during the hospital course.  Hemoglobin was 5.3 on presentation and 7.6 upon discharge home.  He received IV iron during the hospital course.  Patient had an endoscopy which was unrevealing.  Colonoscopy did not see any blood in the colon and there was a polyp removed.  Case discussed with Dr. Vicente Males gastroenterology and the plan will be IV iron through Dr. Tasia Catchings as outpatient.  Capsule endoscopy will be needed as outpatient.  Hold off on anticoagulation at this time.  Recommend checking a CBC on a weekly basis. 2.  Recurrent DVTs.  Patient had an IVC filter placed on 10/12/2017.  Unable to give anticoagulation at this time. 3.  Chest pain with borderline troponin and he is demand ischemia from a hemoglobin of 5.3. 4.  Community-acquired pneumonia.  Patient received antibiotic  course while here in the hospital 5.  Peripheral arterial disease status post left BKA in the past. 6.  B12 deficiency.  Will give IM B12 here and oral B12 upon disposition. 7.  History of chronic diastolic congestive heart failure.  Can restart Lasix daily as outpatient.  Blood pressure little too low for beta-blocker.  Can consider restarting as outpatient.  Satisfactory  DISCHARGE CONDITIONS:   Satisfactory  CONSULTS OBTAINED:  Treatment Team:  Virgel Manifold, MD Twana First, MD Algernon Huxley, MD  DRUG ALLERGIES:  No Known Allergies  DISCHARGE MEDICATIONS:   Allergies as of 10/15/2017   No Known Allergies     Medication List    STOP taking these medications   aspirin 81 MG EC tablet   enoxaparin 100 MG/ML injection Commonly known as:  LOVENOX   metoprolol tartrate 25 MG tablet Commonly known as:  LOPRESSOR   XARELTO 20 MG Tabs tablet Generic drug:  rivaroxaban     TAKE these medications   acetaminophen 325 MG tablet Commonly known as:  TYLENOL Take 650 mg by mouth at bedtime.   baclofen 10 MG tablet Commonly known as:  LIORESAL Take 10 mg by mouth 3 (three) times daily. Hold for sedation   budesonide-formoterol 80-4.5 MCG/ACT inhaler Commonly known as:  SYMBICORT Inhale 2 puffs into the lungs 2 (two) times daily.   cyanocobalamin 1000 MCG tablet Take 1 tablet (1,000 mcg total) by mouth daily. Start taking on:  10/16/2017   fentaNYL  12 MCG/HR Commonly known as:  [place] - dosed mcg/hr Place 1 patch (12.5 mcg total) onto the skin every 3 (three) days. What changed:  how much to take   Fish Oil 1000 MG Caps Take 1,000 mg by mouth every morning.   furosemide 40 MG tablet Commonly known as:  LASIX Take 1 tablet (40 mg total) by mouth daily. What changed:  when to take this   gabapentin 100 MG capsule Commonly known as:  NEURONTIN Take 100 mg by mouth at bedtime.   guaiFENesin 100 MG/5ML Soln Commonly known as:  ROBITUSSIN Take 15 mLs by  mouth 3 (three) times daily as needed for cough or to loosen phlegm.   Melatonin 5 MG Caps Take 1 capsule by mouth at bedtime as needed.   nitroGLYCERIN 0.4 MG SL tablet Commonly known as:  NITROSTAT Place 0.14 mg under the tongue every 5 (five) minutes as needed for chest pain.   Oxycodone HCl 10 MG Tabs Take 1 tablet (10 mg total) by mouth 3 (three) times daily as needed (moderate pain). What changed:    when to take this  reasons to take this   potassium chloride 10 MEQ tablet Commonly known as:  K-DUR Take 20 mEq by mouth daily.   ranitidine 150 MG capsule Commonly known as:  ZANTAC Take 150 mg by mouth every evening.   tiotropium 18 MCG inhalation capsule Commonly known as:  SPIRIVA Place 18 mcg into inhaler and inhale daily.        DISCHARGE INSTRUCTIONS:   Follow-up with Dr. at [facility] 1 day Follow-up with gastroenterology Dr. Vicente Males 1 week for capsule endoscopy Follow-up with Dr. Tasia Catchings hematology for IV iron infusions  If you experience worsening of your admission symptoms, develop shortness of breath, life threatening emergency, suicidal or homicidal thoughts you must seek medical attention immediately by calling 911 or calling your MD immediately  if symptoms less severe.  You Must read complete instructions/literature along with all the possible adverse reactions/side effects for all the Medicines you take and that have been prescribed to you. Take any new Medicines after you have completely understood and accept all the possible adverse reactions/side effects.   Please note  You were cared for by a hospitalist during your hospital stay. If you have any questions about your discharge medications or the care you received while you were in the hospital after you are discharged, you can call the unit and asked to speak with the hospitalist on call if the hospitalist that took care of you is not available. Once you are discharged, your primary care physician  will handle any further medical issues. Please note that NO REFILLS for any discharge medications will be authorized once you are discharged, as it is imperative that you return to your primary care physician (or establish a relationship with a primary care physician if you do not have one) for your aftercare needs so that they can reassess your need for medications and monitor your lab values.    Today   CHIEF COMPLAINT:   Chief Complaint  Patient presents with  . Chest Pain    HISTORY OF PRESENT ILLNESS:  Illias Pantano  is a 67 y.o. male presented with chest pain and found to have symptomatic anemia   VITAL SIGNS:  Blood pressure (!) 134/55, pulse 69, temperature 98.7 F (37.1 C), temperature source Oral, resp. rate 18, height 6\' 1"  (1.854 m), weight 86.7 kg (191 lb 1.6 oz), SpO2 98 %.  PHYSICAL EXAMINATION:  GENERAL:  67 y.o.-year-old patient lying in the bed with no acute distress.  EYES: Pupils equal, round, reactive to light and accommodation. No scleral icterus. Extraocular muscles intact.  HEENT: Head atraumatic, normocephalic. Oropharynx and nasopharynx clear.  NECK:  Supple, no jugular venous distention. No thyroid enlargement, no tenderness.  LUNGS: Normal breath sounds bilaterally, no wheezing, rales,rhonchi or crepitation. No use of accessory muscles of respiration.  CARDIOVASCULAR: S1, S2 normal. No murmurs, rubs, or gallops.  ABDOMEN: Soft, non-tender, non-distended. Bowel sounds present. No organomegaly or mass.  EXTREMITIES: No pedal edema, cyanosis, or clubbing.  NEUROLOGIC: Cranial nerves II through XII are intact. Muscle strength 5/5 in all extremities. Sensation intact. Gait not checked.  PSYCHIATRIC: The patient is alert and oriented x 3.  SKIN: No obvious rash, lesion, or ulcer.   DATA REVIEW:   CBC Recent Labs  Lab 10/15/17 0532  WBC 10.4  HGB 7.6*  HCT 25.4*  PLT 324    Chemistries  Recent Labs  Lab 10/11/17 0338  NA 140  K 3.4*  CL  110  CO2 23  GLUCOSE 101*  BUN 13  CREATININE 0.88  CALCIUM 8.2*  AST 16  ALT 8*  ALKPHOS 89  BILITOT 2.0*    Cardiac Enzymes Recent Labs  Lab 10/11/17 1555  TROPONINI 0.25*    Microbiology Results  Results for orders placed or performed during the hospital encounter of 10/10/17  MRSA PCR Screening     Status: None   Collection Time: 10/10/17 10:05 AM  Result Value Ref Range Status   MRSA by PCR NEGATIVE NEGATIVE Final    Comment:        The GeneXpert MRSA Assay (FDA approved for NASAL specimens only), is one component of a comprehensive MRSA colonization surveillance program. It is not intended to diagnose MRSA infection nor to guide or monitor treatment for MRSA infections. Performed at [facility], [address], [place], [place] [address]        Management plans discussed with the patient, and he is in agreement.  CODE STATUS:     Code Status Orders  (From admission, onward)        Start     Ordered   10/10/17 0953  Full code  Continuous     10/10/17 0955    Code Status History    Date Active Date Inactive Code Status Order ID Comments User Context   04/10/2016 15:41 04/11/2016 19:54 Full Code [id number]  [facility] S[facility]z ED   03/31/2016 11:08 03/31/2016 15:51 Full Code [id number]  Algernon Huxley, MD Inpatient   09/14/2015 11:45 09/24/2015 19:15 Full Code [id number]  Leonides Schanz Inpatient   08/22/2015 17:12 08/22/2015 20:19 Full Code [id number]  Algernon Huxley, MD Inpatient   07/30/2015 11:16 07/30/2015 16:53 Full Code [id number]  Algernon Huxley, MD Inpatient    Advance Directive Documentation     Most Recent Value  Type of Advance Directive  Healthcare Power of Attorney  Pre-existing out of facility DNR order (yellow form or pink MOST form)  No data  "MOST" Form in Place?  No data      TOTAL TIME TAKING CARE OF THIS PATIENT: 35 minutes.    Loletha Grayer M.D on 10/15/2017 at 9:57 AM  Between 7am to 6pm - Pager -  [phone]  After 6pm go to [email or web address] - password [email or web address] [facility]  [phone]  CC: Primary care physician; Alvester Morin, MD

## 2017-10-15 NOTE — Discharge Instructions (Signed)
Gastrointestinal Bleeding °Gastrointestinal (GI) bleeding is bleeding somewhere along the digestive tract, between the mouth and anus. This can be caused by various problems. The severity of these problems can range from mild to serious or even life-threatening. If you have GI bleeding, you may find blood in your stools (feces), you may have black stools, or you may vomit blood. If there is a lot of bleeding, you may need to stay in the hospital. °What are the causes? °This condition may be caused by: °· Esophagitis. This is inflammation, irritation, or swelling of the esophagus. °· Hemorrhoids. These are swollen veins in the rectum. °· Anal fissures. These are areas of painful tearing that are often caused by passing hard stool. °· Diverticulosis. These are pouches that form on the colon over time, with age, and may bleed a lot. °· Diverticulitis. This is inflammation in areas with diverticulosis. It can cause pain, fever, and bloody stools, although bleeding may be mild. °· Polyps and cancer. Colon cancer often starts out as precancerous polyps. °· Gastritis and ulcers. With these, bleeding may come from the upper GI tract, near the stomach. ° °What are the signs or symptoms? °Symptoms of this condition may include: °· Bright red blood in your vomit, or vomit that looks like coffee grounds. °· Bloody, black, or tarry stools. °? Bleeding from the lower GI tract will usually cause red or maroon blood in the stools. °? Bleeding from the upper GI tract may cause black, tarry, often bad-smelling stools. °? In certain cases, if the bleeding is fast enough, the stools may be red. °· Pain or cramping in the abdomen. ° °How is this diagnosed? °This condition may be diagnosed based on: °· Medical history and physical exam. °· Various tests, such as: °? Blood tests. °? X-rays and other imaging tests. °? Esophagogastroduodenoscopy (EGD). In this test, a flexible, lighted tube is used to look at your esophagus, stomach, and  small intestine. °? Colonoscopy. In this test, a flexible, lighted tube is used to look at your colon. ° °How is this treated? °Treatment for this condition depends on the cause of the bleeding. For example: °· For bleeding from the esophagus, stomach, small intestine, or colon, the health care provider doing your EGD or colonoscopy may be able to stop the bleeding as part of the procedure. °· Inflammation or infection of the colon can be treated with medicines. °· Certain rectal problems can be treated with creams, suppositories, or warm baths. °· Surgery is sometimes needed. °· Blood transfusions are sometimes needed if a lot of blood has been lost. ° °If bleeding is slow, you may be allowed to go home. If there is a lot of bleeding, you will need to stay in the hospital for observation. °Follow these instructions at home: °· Take over-the-counter and prescription medicines only as told by your health care provider. °· Eat foods that are high in fiber. This will help to keep your stools soft. These foods include whole grains, legumes, fruits, and vegetables. Eating 1-3 prunes each day works well for many people. °· Drink enough fluid to keep your urine clear or pale yellow. °· Keep all follow-up visits as told by your health care provider. This is important. °Contact a health care provider if: °· Your symptoms do not improve. °Get help right away if: °· Your bleeding increases. °· You feel light-headed or you faint. °· You feel weak. °· You have severe cramps in your back or abdomen. °· You pass large blood clots in your stool. °·   Your symptoms are getting worse. °This information is not intended to replace advice given to you by your health care provider. Make sure you discuss any questions you have with your health care provider. °Document Released: 08/01/2000 Document Revised: 01/02/2016 Document Reviewed: 01/22/2015 °[employer] Interactive Patient Education © 2018 [employer] ° °

## 2017-10-16 DIAGNOSIS — M6281 Muscle weakness (generalized): Secondary | ICD-10-CM | POA: Diagnosis not present

## 2017-10-16 DIAGNOSIS — R293 Abnormal posture: Secondary | ICD-10-CM | POA: Diagnosis not present

## 2017-10-19 DIAGNOSIS — D649 Anemia, unspecified: Secondary | ICD-10-CM | POA: Diagnosis not present

## 2017-10-19 DIAGNOSIS — M6281 Muscle weakness (generalized): Secondary | ICD-10-CM | POA: Diagnosis not present

## 2017-10-19 DIAGNOSIS — R293 Abnormal posture: Secondary | ICD-10-CM | POA: Diagnosis not present

## 2017-10-19 LAB — SURGICAL PATHOLOGY

## 2017-10-20 DIAGNOSIS — R293 Abnormal posture: Secondary | ICD-10-CM | POA: Diagnosis not present

## 2017-10-20 DIAGNOSIS — M6281 Muscle weakness (generalized): Secondary | ICD-10-CM | POA: Diagnosis not present

## 2017-10-21 DIAGNOSIS — M6281 Muscle weakness (generalized): Secondary | ICD-10-CM | POA: Diagnosis not present

## 2017-10-21 DIAGNOSIS — R293 Abnormal posture: Secondary | ICD-10-CM | POA: Diagnosis not present

## 2017-10-22 ENCOUNTER — Ambulatory Visit (INDEPENDENT_AMBULATORY_CARE_PROVIDER_SITE_OTHER): Payer: Medicare Other | Admitting: Gastroenterology

## 2017-10-22 ENCOUNTER — Encounter: Payer: Self-pay | Admitting: Gastroenterology

## 2017-10-22 VITALS — BP 107/63 | HR 92 | Ht 71.0 in

## 2017-10-22 DIAGNOSIS — I7389 Other specified peripheral vascular diseases: Secondary | ICD-10-CM | POA: Diagnosis not present

## 2017-10-22 DIAGNOSIS — I5021 Acute systolic (congestive) heart failure: Secondary | ICD-10-CM | POA: Diagnosis not present

## 2017-10-22 DIAGNOSIS — R5381 Other malaise: Secondary | ICD-10-CM | POA: Diagnosis not present

## 2017-10-22 DIAGNOSIS — M6281 Muscle weakness (generalized): Secondary | ICD-10-CM | POA: Diagnosis not present

## 2017-10-22 DIAGNOSIS — R293 Abnormal posture: Secondary | ICD-10-CM | POA: Diagnosis not present

## 2017-10-22 DIAGNOSIS — D509 Iron deficiency anemia, unspecified: Secondary | ICD-10-CM

## 2017-10-22 DIAGNOSIS — D6489 Other specified anemias: Secondary | ICD-10-CM | POA: Diagnosis not present

## 2017-10-22 NOTE — Progress Notes (Signed)
[facility] Follow up Visit:  Patient Care Team: Alvester Morin, MD as PCP - General (Family Medicine)  REASON FOR VISIT Follow up for treatment of Recurrent DVT and recommendation of perioperative anticoagulation instructions.  PERTINENT HEMATOLOGY HISTORY [name] 67 y.o. male with PMH listed as below is here for evaluation of recurrent DVT and management. Patient is accompanied by RN from [facility] at [facility].  Patient is a poor historian. He remembers having leg clots for more than one time. He remembers taking Eliquis in the past and recently his anticoagulation has been switched to [place].  2 Medical records from nursing home by patient's PCP Dr.Slade-Hartman, patient had been on Eliquis '5mg'$  BID for previous DVTs. His medical records showed he had acute DVT at the left common femoral vein on 11/26/2016, and he was placed on Eliquis 2.'5mg'$  BID. Later when another venous doppler was obtained on 02/15/2017 due to persistent pain and swelling, doppler showed persist deep vein thrombosis involving proxima to distal femoral vein. Common femoral vein has normal compression. There was a note on the second doppler report on 03/03/2017 that patient was taking Eliquis '5mg'$  BID, which was stopped on 7/19 and patient was placed on Lovenox. Patient had swelling and pain of his left stump and symptoms does not improve with being on Lovenox. Xarelto '15mg'$  BID was started on 03/05/2017 and Lovenox was discontinued on 03/09/2017 with a plan to switch to Xarelto '20mg'$  daily after 21 days.  It was mentioned in nursing home note that patient had been on Warfarin previously and acquired blood clot while on warfarin. Patient does not remember this and the details of his?warfarin resistance is unclear.  3 His Lupus anticoagulant testing was positive due to prolonged DRVVT, although possibly falsely positive due to [place]. He was offered to be switched to Lovenox shots  as alternative for treatment of possible lupus anticoagulant hypercoagulable state, patient prefers to stay on Xalreto '20mg'$  daily and has been doing well on that.  4 Iron deficiency anemia  INTERVAL HISTORY Timothy Juarez is a 67 y.o. male who has above history reviewed by me today presents for follow up visit for management of iron deficiency anemia.  Patient was recently admitted due to acute on chronic blood loss anemia, hemoglobin 5.3 on presentation. He was transfused with 2 units of PRBC and IV iron infusion..  Xarelto was discontinued due to GI bleeding.  He was evaluated by Dr. Vicente Males.  Patient is set up for outpatient capsule endoscopy next week. IVC filter was placed on October 12, 2017 given patient's high risk of DVT recurrence. Today patient was accompanied by nursing home staff.  Patient has flat affect.  He says "I feel fine".  Denies any shortness of breath, chest pain, abdominal pain or bright blood in the stool.  No swelling or tenderness of lower left extremity amputation stump  Review of Systems  Constitutional: Negative for appetite change, chills and fatigue.  HENT:   Negative for lump/mass, mouth sores and nosebleeds.   Eyes: Negative for eye problems.  Respiratory: Negative for chest tightness, hemoptysis and shortness of breath.   Cardiovascular: Negative for chest pain and leg swelling.  Gastrointestinal: Negative for abdominal distention, abdominal pain, blood in stool, nausea and rectal pain.  Endocrine: Negative for hot flashes.  Genitourinary: Negative for difficulty urinating, dysuria, hematuria and nocturia.   Musculoskeletal: Negative for back pain and neck pain.       Left stump no swelling  or pain except intermittent spasm.   Skin: Negative for itching.  Neurological: Negative.  Negative for dizziness and numbness.  Psychiatric/Behavioral: Negative for confusion.    MEDICAL HISTORY: Past Medical History:  Diagnosis Date  . Allergy   . Anemia   . ARF  (acute respiratory failure) (HCC)    H/O  . CHF (congestive heart failure) ([place])   . COPD (chronic obstructive pulmonary disease) ([place])   . Hypertension   . Hypokalemia   . Insomnia   . Muscle contracture    MUSCLE SPASMS  . Peripheral vascular disease ([place])   . Pressure ulcer     SURGICAL HISTORY: Past Surgical History:  Procedure Laterality Date  . AMPUTATION Left 09/19/2015   Procedure: AMPUTATION BELOW KNEE;  Surgeon: Algernon Huxley, MD;  Location: [facility] ORS;  Service: Vascular;  Laterality: Left;  . AMPUTATION Left 11/01/2015   Procedure: AMPUTATION ABOVE KNEE;  Surgeon: Algernon Huxley, MD;  Location: [facility] ORS;  Service: Vascular;  Laterality: Left;  . APPLICATION OF WOUND VAC Left 10/18/2015   Procedure: APPLICATION OF WOUND VAC;  Surgeon: Algernon Huxley, MD;  Location: [facility] ORS;  Service: Vascular;  Laterality: Left;  . COLONOSCOPY WITH PROPOFOL N/A 05/25/2017   Procedure: COLONOSCOPY WITH PROPOFOL;  Surgeon: Jonathon Bellows, MD;  Location: [facility] ENDOSCOPY;  Service: Gastroenterology;  Laterality: N/A;  . COLONOSCOPY WITH PROPOFOL N/A 10/14/2017   Procedure: COLONOSCOPY WITH PROPOFOL;  Surgeon: Jonathon Bellows, MD;  Location: [facility] ENDOSCOPY;  Service: Gastroenterology;  Laterality: N/A;  . ESOPHAGOGASTRODUODENOSCOPY (EGD) WITH PROPOFOL N/A 05/25/2017   Procedure: ESOPHAGOGASTRODUODENOSCOPY (EGD) WITH PROPOFOL;  Surgeon: Jonathon Bellows, MD;  Location: [facility] ENDOSCOPY;  Service: Gastroenterology;  Laterality: N/A;  . ESOPHAGOGASTRODUODENOSCOPY (EGD) WITH PROPOFOL N/A 10/14/2017   Procedure: ESOPHAGOGASTRODUODENOSCOPY (EGD) WITH PROPOFOL;  Surgeon: Jonathon Bellows, MD;  Location: [facility] ENDOSCOPY;  Service: Gastroenterology;  Laterality: N/A;  . GIVENS CAPSULE STUDY N/A 07/14/2017   Procedure: GIVENS CAPSULE STUDY;  Surgeon: Jonathon Bellows, MD;  Location: [facility] ENDOSCOPY;  Service: Gastroenterology;  Laterality: N/A;  . IVC FILTER INSERTION N/A 10/12/2017   Procedure: IVC FILTER INSERTION;  Surgeon: Algernon Huxley, MD;   Location: [place] CV LAB;  Service: Cardiovascular;  Laterality: N/A;  . PERIPHERAL VASCULAR CATHETERIZATION Left 01/18/2015   Procedure: Lower Extremity Angiography;  Surgeon: Algernon Huxley, MD;  Location: [place] CV LAB;  Service: Cardiovascular;  Laterality: Left;  . PERIPHERAL VASCULAR CATHETERIZATION N/A 01/18/2015   Procedure: Lower Extremity Intervention;  Surgeon: Algernon Huxley, MD;  Location: [place] CV LAB;  Service: Cardiovascular;  Laterality: N/A;  . PERIPHERAL VASCULAR CATHETERIZATION  07/30/2015   Procedure: Lower Extremity Intervention;  Surgeon: Algernon Huxley, MD;  Location: [place] CV LAB;  Service: Cardiovascular;;  . PERIPHERAL VASCULAR CATHETERIZATION N/A 07/30/2015   Procedure: Abdominal Aortogram w/Lower Extremity;  Surgeon: Algernon Huxley, MD;  Location: [place] CV LAB;  Service: Cardiovascular;  Laterality: N/A;  . PERIPHERAL VASCULAR CATHETERIZATION Left 08/22/2015   Procedure: Lower Extremity Angiography;  Surgeon: Algernon Huxley, MD;  Location: [place] CV LAB;  Service: Cardiovascular;  Laterality: Left;  . PERIPHERAL VASCULAR CATHETERIZATION Left 08/22/2015   Procedure: Lower Extremity Intervention;  Surgeon: Algernon Huxley, MD;  Location: [place] CV LAB;  Service: Cardiovascular;  Laterality: Left;  . PERIPHERAL VASCULAR CATHETERIZATION Right 03/31/2016   Procedure: Lower Extremity Angiography;  Surgeon: Algernon Huxley, MD;  Location: [place] CV LAB;  Service: Cardiovascular;  Laterality: Right;  . PERIPHERAL VASCULAR CATHETERIZATION  03/31/2016   Procedure: Lower Extremity Intervention;  Surgeon: Algernon Huxley, MD;  Location: [place] CV LAB;  Service: Cardiovascular;;  . PERIPHERAL VASCULAR CATHETERIZATION Left 04/10/2016   Procedure: Lower Extremity Angiography;  Surgeon: Algernon Huxley, MD;  Location: [place] CV LAB;  Service: Cardiovascular;  Laterality: Left;  . WOUND DEBRIDEMENT Left 10/18/2015   Procedure: DEBRIDEMENT WOUND   ( LEFT BKA  DEBRIDEMENT );  Surgeon: Algernon Huxley, MD;  Location: [facility] ORS;  Service: Vascular;  Laterality: Left;    SOCIAL HISTORY: Social History   Socioeconomic History  . Marital status: Legally Separated    Spouse name: Not on file  . Number of children: Not on file  . Years of education: Not on file  . Highest education level: Not on file  Social Needs  . Financial resource strain: Not on file  . Food insecurity - worry: Not on file  . Food insecurity - inability: Not on file  . Transportation needs - medical: Not on file  . Transportation needs - non-medical: Not on file  Occupational History  . Not on file  Tobacco Use  . Smoking status: Former Smoker    Packs/day: 1.00    Years: 44.00    Pack years: 44.00    Types: Cigarettes    Last attempt to quit: 11/17/2012    Years since quitting: 4.9  . Smokeless tobacco: Never Used  Substance and Sexual Activity  . Alcohol use: No  . Drug use: No  . Sexual activity: No  Other Topics Concern  . Not on file  Social History Narrative  . Not on file    FAMILY HISTORY: Mother died from an MI. Father died from a brain tumor  ALLERGIES:  has No Known Allergies.  MEDICATIONS:  Current Outpatient Medications  Medication Sig Dispense Refill  . acetaminophen (TYLENOL) 325 MG tablet Take 650 mg by mouth at bedtime.     . ALPRAZolam (XANAX) 0.5 MG tablet     . baclofen (LIORESAL) 10 MG tablet Take 10 mg by mouth 3 (three) times daily. Hold for sedation    . budesonide-formoterol (SYMBICORT) 80-4.5 MCG/ACT inhaler Inhale 2 puffs into the lungs 2 (two) times daily.    . fentaNYL (DURAGESIC - DOSED MCG/HR) 12 MCG/HR Place 1 patch (12.5 mcg total) onto the skin every 3 (three) days. 5 patch 0  . furosemide (LASIX) 40 MG tablet Take 1 tablet (40 mg total) by mouth daily. 30 tablet   . gabapentin (NEURONTIN) 100 MG capsule Take 100 mg by mouth at bedtime.    [name] guaiFENesin (ROBITUSSIN) 100 MG/5ML SOLN Take 15 mLs by mouth 3 (three) times daily as  needed for cough or to loosen phlegm.    . INCRUSE ELLIPTA 62.5 MCG/INH AEPB     . ipratropium-albuterol (DUONEB) 0.5-2.5 (3) MG/3ML SOLN     . levofloxacin (LEVAQUIN) 500 MG tablet     . Melatonin 5 MG CAPS Take 1 capsule by mouth at bedtime as needed.    . nitroGLYCERIN (NITROSTAT) 0.4 MG SL tablet Place 0.14 mg under the tongue every 5 (five) minutes as needed for chest pain.    . Omega-3 Fatty Acids (FISH OIL) 1000 MG CAPS Take 1,000 mg by mouth every morning.    [name] oxyCODONE 10 MG TABS Take 1 tablet (10 mg total) by mouth 3 (three) times daily as needed (moderate pain). 15 tablet 0  . potassium chloride (K-DUR) 10 MEQ tablet Take 20 mEq by mouth daily.     [name]  potassium chloride SA (K-DUR,KLOR-CON) 20 MEQ tablet     . ranitidine (ZANTAC) 150 MG capsule Take 150 mg by mouth every evening.    . ranitidine (ZANTAC) 150 MG tablet     . tiotropium (SPIRIVA) 18 MCG inhalation capsule Place 18 mcg into inhaler and inhale daily.    . vitamin B-12 1000 MCG tablet Take 1 tablet (1,000 mcg total) by mouth daily. 30 tablet 0   No current facility-administered medications for this visit.     PHYSICAL EXAMINATION:  ECOG PERFORMANCE STATUS: 1 - Symptomatic but completely ambulatory  Vitals:   10/23/17 1116  BP: (!) 121/51  Pulse: (!) 58  Temp: (!) 97.4 F (36.3 C)    There were no vitals filed for this visit. Physical Exam  Constitutional: He is well-developed, well-nourished, and in no distress. No distress.  HENT:  Head: Normocephalic and atraumatic.  Mouth/Throat: Oropharynx is clear and moist. No oropharyngeal exudate.  Eyes: EOM are normal. Pupils are equal, round, and reactive to light.  Neck: Normal range of motion. Neck supple.  Cardiovascular: Normal rate and regular rhythm.  No murmur heard. Pulmonary/Chest: Effort normal and breath sounds normal. He has no wheezes. He has no rales.  Abdominal: Soft. Bowel sounds are normal. He exhibits no distension.  Musculoskeletal: He  exhibits no edema.  left above knee amputation stump non tender or swelling. No edema at right lower extremity  Neurological: He is alert.      LABORATORY DATA: I have personally reviewed the data as listed:  Admission on 10/10/2017, Discharged on 10/15/2017  Component Date Value Ref Range Status  . Sodium 10/10/2017 138  135 - 145 mmol/L Final  . Potassium 10/10/2017 3.8  3.5 - 5.1 mmol/L Final  . Chloride 10/10/2017 108  101 - 111 mmol/L Final  . CO2 10/10/2017 21* 22 - 32 mmol/L Final  . Glucose, Bld 10/10/2017 121* 65 - 99 mg/dL Final  . BUN 10/10/2017 21* 6 - 20 mg/dL Final  . Creatinine, Ser 10/10/2017 1.30* 0.61 - 1.24 mg/dL Final  . Calcium 10/10/2017 8.5* 8.9 - 10.3 mg/dL Final  . GFR calc non Af Amer 10/10/2017 56* >60 mL/min Final  . GFR calc Af Amer 10/10/2017 >60  >60 mL/min Final   Comment: (NOTE) The eGFR has been calculated using the CKD EPI equation. This calculation has not been validated in all clinical situations. eGFR's persistently <60 mL/min signify possible Chronic Kidney Disease.   Georgiann Hahn gap 10/10/2017 9  5 - 15 Final   Performed at [facility], [address] [place], [place] [address]  . WBC 10/10/2017 13.0* 3.8 - 10.6 K/uL Final  . RBC 10/10/2017 3.52* 4.40 - 5.90 MIL/uL Final  . Hemoglobin 10/10/2017 5.3* 13.0 - 18.0 g/dL Final   RESULT REPEATED AND VERIFIED  . HCT 10/10/2017 19.1* 40.0 - 52.0 % Final  . MCV 10/10/2017 54.2* 80.0 - 100.0 fL Final  . MCH 10/10/2017 15.1* 26.0 - 34.0 pg Final  . MCHC 10/10/2017 28.0* 32.0 - 36.0 g/dL Final  . RDW 10/10/2017 23.0* 11.5 - 14.5 % Final  . Platelets 10/10/2017 422  150 - 440 K/uL Final   Performed at [facility], [address], [place], [place] [address]  . Troponin I 10/10/2017 0.03* <0.03 ng/mL Final   Comment: CRITICAL RESULT CALLED TO, READ BACK BY AND VERIFIED WITH Gwynn Burly AT 0744 ON 10/10/17  BY [facility] Performed at [facility], [address]  [place], [place] [address]   .  Order Confirmation 10/10/2017    Final                   Value:ORDER PROCESSED BY BLOOD BANK Performed at [facility], [address] [place], [place] [address]   . ABO/RH(D) 10/10/2017 A POS   Final  . Antibody Screen 10/10/2017 NEG   Final  . Sample Expiration 10/10/2017 10/13/2017   Final  . Unit Number 10/10/2017 [id number]   Final  . Blood Component Type 10/10/2017 RED CELLS,LR   Final  . Unit division 10/10/2017 00   Final  . Status of Unit 10/10/2017 ISSUED,FINAL   Final  . Transfusion Status 10/10/2017 OK TO TRANSFUSE   Final  . Crossmatch Result 10/10/2017    Final                   Value:Compatible Performed at [facility], [address] [place], [place] [address]   . Unit Number 10/10/2017 [id number]   Final  . Blood Component Type 10/10/2017 RED CELLS,LR   Final  . Unit division 10/10/2017 00   Final  . Status of Unit 10/10/2017 ISSUED,FINAL   Final  . Transfusion Status 10/10/2017 OK TO TRANSFUSE   Final  . Crossmatch Result 10/10/2017 Compatible   Final  . aPTT 10/10/2017 46* 24 - 36 seconds Final   Comment:        IF BASELINE aPTT IS ELEVATED, SUGGEST PATIENT RISK ASSESSMENT BE USED TO DETERMINE APPROPRIATE ANTICOAGULANT THERAPY. Performed at [facility], [address] [place], [place] [address]   . Prothrombin Time 10/10/2017 25.9* 11.4 - 15.2 seconds Final  . INR 10/10/2017 2.39   Final   Performed at [facility], [address] [place], [place] [address]  . Weight 10/11/2017 3,108.8  oz Final  . Height 10/11/2017 73  in Final  . BP 10/11/2017 128/49  mmHg Final  . LV PW d 10/11/2017 8.77* 0.6 - 1.1 mm Final  . FS 10/11/2017 47* 28 - 44 % Final  . LA vol 10/11/2017 58.6  mL Final  . LA ID, A-P, ES 10/11/2017 39  mm Final  . IVS/LV PW RATIO, ED 10/11/2017 1.06   Final  . LV e' LATERAL 10/11/2017 7.94  cm/s Final  . LV E/e' medial 10/11/2017 9.86   Final  . LV E/e'average  10/11/2017 9.86   Final  . LA diam index 10/11/2017 1.82  cm/m2 Final  . LA vol A4C 10/11/2017 51.1  ml Final  . Area-P 1/2 10/11/2017 3.44  cm2 Final  . E decel time 10/11/2017 218  msec Final  . Peak grad 10/11/2017 2  mmHg Final  . E/e' ratio 10/11/2017 9.86   Final  . MV pk E vel 10/11/2017 78.3  m/s Final  . P 1/2 time 10/11/2017 64  ms Final  . MV pk A vel 10/11/2017 116  m/s Final  . LA vol index 10/11/2017 27.4  mL/m2 Final  . MV Dec 10/11/2017 218   Final  . LA diam end sys 10/11/2017 39.00  mm Final  . TDI e' medial 10/11/2017 5.33   Final  . TDI e' lateral 10/11/2017 7.94   Final  . Lateral S' vel 10/11/2017 15.40  cm/sec Final  . TAPSE 10/11/2017 36.00  mm Final  . Hemoglobin 10/10/2017 8.2* 13.0 - 18.0 g/dL Final   RESULT REPEATED AND VERIFIED  . HCT 10/10/2017 26.4* 40.0 - 52.0 % Final   Performed at [facility], [address], [place], [place] [address]  . Troponin I 10/10/2017 0.56* <0.03 ng/mL Final   Comment: CRITICAL RESULT CALLED TO, READ BACK BY AND VERIFIED WITH ALISA SCOTT AT 2136 ON 10/10/17 RWW Performed at [facility], [address] [place], [place] [address]   . Procalcitonin 10/10/2017 <0.10  ng/mL Final   Comment:        Interpretation: PCT (Procalcitonin) <= 0.5 ng/mL: Systemic infection (sepsis) is not likely. Local bacterial infection is possible. (NOTE)       Sepsis PCT Algorithm           Lower Respiratory Tract                                      Infection PCT Algorithm    ----------------------------     ----------------------------         PCT < 0.25 ng/mL                PCT < 0.10 ng/mL         Strongly encourage             Strongly discourage   discontinuation of antibiotics    initiation of antibiotics    ----------------------------     -----------------------------       PCT 0.25 - 0.50 ng/mL            PCT 0.10 - 0.25 ng/mL               OR       >80% decrease in PCT            Discourage initiation of                                             antibiotics      Encourage discontinuation           of antibiotics    ----------------------------     -----------------------------         PCT >= 0.50 ng/mL              PCT 0.26 - 0.50 ng/mL               AND                                 <80% decrease in PCT             Encourage initiation of                                             antibiotics       Encourage continuation           of antibiotics    ----------------------------     -----------------------------        PCT >= 0.50 ng/mL                  PCT > 0.50 ng/mL               AND         increase in PCT  Strongly encourage                                      initiation of antibiotics    Strongly encourage escalation           of antibiotics                                     -----------------------------                                           PCT <= 0.25 ng/mL                                                 OR                                        > 80% decrease in PCT                                     Discontinue / Do not initiate                                             antibiotics Performed at [facility], [address] [place], [place] [address]   . ISSUE DATE / TIME 10/10/2017 [id number]   Final  . Blood Product Unit Number 10/10/2017 [id number]   Final  . PRODUCT CODE 10/10/2017 [id number]   Final  . Unit Type and Rh 10/10/2017 6200   Final  . Blood Product Expiration Date 10/10/2017 [id number]   Final  . ISSUE DATE / TIME 10/10/2017 [id number]   Final  . Blood Product Unit Number 10/10/2017 [id number]   Final  . PRODUCT CODE 10/10/2017 [id number]   Final  . Unit Type and Rh 10/10/2017 6200   Final  . Blood Product Expiration Date 10/10/2017 [id number]   Final  . MRSA by PCR 10/10/2017 NEGATIVE  NEGATIVE Final   Comment:        The GeneXpert MRSA Assay (FDA approved for NASAL specimens only), is one component of  a comprehensive MRSA colonization surveillance program. It is not intended to diagnose MRSA infection nor to guide or monitor treatment for MRSA infections. Performed at [facility], [address] [place], [place] [address]   . Ferritin 10/10/2017 18* 24 - 336 ng/mL Final   Performed at [facility], [address] [place], [place] [address]  . Iron 10/10/2017 24* 45 - 182 ug/dL Final  . TIBC 10/10/2017 419  250 - 450 ug/dL Final  . Saturation Ratios 10/10/2017 6* 17.9 - 39.5 % Final  . UIBC 10/10/2017 395  ug/dL Final   Performed at [facility], [address] [place],  [address]  . WBC 10/11/2017 13.7* 3.8 - 10.6 K/uL Final  .  RBC 10/11/2017 4.12* 4.40 - 5.90 MIL/uL Final  . Hemoglobin 10/11/2017 7.8* 13.0 - 18.0 g/dL Final  . HCT 10/11/2017 25.5* 40.0 - 52.0 % Final  . MCV 10/11/2017 61.8* 80.0 - 100.0 fL Final  . MCH 10/11/2017 19.0* 26.0 - 34.0 pg Final  . MCHC 10/11/2017 30.7* 32.0 - 36.0 g/dL Final  . RDW 10/11/2017 33.9* 11.5 - 14.5 % Final  . Platelets 10/11/2017 320  150 - 440 K/uL Final   Performed at [facility], [address] [place], [place] [address]  . Sodium 10/11/2017 140  135 - 145 mmol/L Final  . Potassium 10/11/2017 3.4* 3.5 - 5.1 mmol/L Final  . Chloride 10/11/2017 110  101 - 111 mmol/L Final  . CO2 10/11/2017 23  22 - 32 mmol/L Final  . Glucose, Bld 10/11/2017 101* 65 - 99 mg/dL Final  . BUN 10/11/2017 13  6 - 20 mg/dL Final  . Creatinine, Ser 10/11/2017 0.88  0.61 - 1.24 mg/dL Final  . Calcium 10/11/2017 8.2* 8.9 - 10.3 mg/dL Final  . Total Protein 10/11/2017 6.9  6.5 - 8.1 g/dL Final  . Albumin 10/11/2017 3.4* 3.5 - 5.0 g/dL Final  . AST 10/11/2017 16  15 - 41 U/L Final  . ALT 10/11/2017 8* 17 - 63 U/L Final  . Alkaline Phosphatase 10/11/2017 89  38 - 126 U/L Final  . Total Bilirubin 10/11/2017 2.0* 0.3 - 1.2 mg/dL Final  . GFR calc non Af Amer 10/11/2017 >60  >60 mL/min Final  . GFR calc Af Amer  10/11/2017 >60  >60 mL/min Final   Comment: (NOTE) The eGFR has been calculated using the CKD EPI equation. This calculation has not been validated in all clinical situations. eGFR's persistently <60 mL/min signify possible Chronic Kidney Disease.   Georgiann Hahn gap 10/11/2017 7  5 - 15 Final   Performed at [facility], [address] [place], [place] [address]  . Troponin I 10/11/2017 0.44* <0.03 ng/mL Final   Comment: CRITICAL VALUE NOTED. VALUE IS CONSISTENT WITH PREVIOUSLY REPORTED/CALLED VALUE.PMH Performed at [facility], [address], [place], [place] [address]   . Troponin I 10/11/2017 0.31* <0.03 ng/mL Final   Comment: CRITICAL VALUE NOTED. VALUE IS CONSISTENT WITH PREVIOUSLY REPORTED/CALLED VALUE / [place] Performed at [facility], [address] [place], [place] [address]   . Troponin I 10/11/2017 0.25* <0.03 ng/mL Final   Comment: CRITICAL VALUE NOTED. VALUE IS CONSISTENT WITH PREVIOUSLY REPORTED/CALLED VALUE / [place] Performed at [facility], [address] [place], [place] [address]   . Hemoglobin 10/11/2017 8.0* 13.0 - 18.0 g/dL Final  . HCT 10/11/2017 26.4* 40.0 - 52.0 % Final   Performed at [facility], [address] [place], [place] [address]  . Hemoglobin 10/12/2017 7.4* 13.0 - 18.0 g/dL Final  . HCT 10/12/2017 24.7* 40.0 - 52.0 % Final   Performed at [facility], [address] [place],  [address]  . WBC 10/12/2017 13.5* 3.8 - 10.6 K/uL Final  . RBC 10/12/2017 4.16* 4.40 - 5.90 MIL/uL Final  . Hemoglobin 10/12/2017 7.8* 13.0 - 18.0 g/dL Final  . HCT 10/12/2017 26.2* 40.0 - 52.0 % Final  . MCV 10/12/2017 62.8* 80.0 - 100.0 fL Final  . MCH 10/12/2017 18.8* 26.0 - 34.0 pg Final  . MCHC 10/12/2017 30.0* 32.0 - 36.0 g/dL Final  . RDW 10/12/2017 34.0* 11.5 - 14.5 % Final  . Platelets 10/12/2017 332  150 - 440 K/uL Final  Performed at [facility], [address], [place], [place]  [address]  . Hemoglobin 10/12/2017 7.9* 13.0 - 18.0 g/dL Final  . HCT 10/12/2017 26.6* 40.0 - 52.0 % Final   Performed at [facility], [address] [place], [place] [address]  . WBC 10/13/2017 14.2* 3.8 - 10.6 K/uL Final  . RBC 10/13/2017 4.40  4.40 - 5.90 MIL/uL Final  . Hemoglobin 10/13/2017 8.2* 13.0 - 18.0 g/dL Final   RESULT REPEATED AND VERIFIED  . HCT 10/13/2017 27.8* 40.0 - 52.0 % Final  . MCV 10/13/2017 63.1* 80.0 - 100.0 fL Final  . MCH 10/13/2017 18.6* 26.0 - 34.0 pg Final  . MCHC 10/13/2017 29.5* 32.0 - 36.0 g/dL Final  . RDW 10/13/2017 34.3* 11.5 - 14.5 % Final  . Platelets 10/13/2017 353  150 - 440 K/uL Final   Performed at [facility], [address] [place], [place] [address]  . WBC 10/14/2017 13.2* 3.8 - 10.6 K/uL Final  . RBC 10/14/2017 4.27* 4.40 - 5.90 MIL/uL Final  . Hemoglobin 10/14/2017 8.1* 13.0 - 18.0 g/dL Final  . HCT 10/14/2017 27.0* 40.0 - 52.0 % Final  . MCV 10/14/2017 63.3* 80.0 - 100.0 fL Final  . MCH 10/14/2017 18.9* 26.0 - 34.0 pg Final  . MCHC 10/14/2017 29.9* 32.0 - 36.0 g/dL Final  . RDW 10/14/2017 35.7* 11.5 - 14.5 % Final  . Platelets 10/14/2017 343  150 - 440 K/uL Final   Performed at [facility], [address], [place], [place] [address]  . Folate 10/14/2017 8.3  >5.9 ng/mL Final   Performed at [facility], [address] [place], [place] [address]  . Vitamin B-12 10/14/2017 274  180 - 914 pg/mL Final   Comment: (NOTE) This assay is not validated for testing neonatal or myeloproliferative syndrome specimens for Vitamin B12 levels. Performed at [facility], [place] [address] [place], [place] [address]   . SURGICAL PATHOLOGY 10/14/2017    Final                   Value:Surgical Pathology CASE: [id number] PATIENT: Kennedy Bucker Surgical Pathology Report     SPECIMEN SUBMITTED: A. Colon polyp, descending; cold snare  CLINICAL HISTORY: None provided  PRE-OPERATIVE  DIAGNOSIS: Anemia  POST-OPERATIVE DIAGNOSIS: Normal EGD     DIAGNOSIS: A. COLON POLYP, DESCENDING; COLD SNARE: - TANGENTIAL SECTIONS OF EARLY TUBULAR ADENOMA. - NEGATIVE FOR HIGH GRADE DYSPLASIA AND MALIGNANCY. - DEEPER SECTIONS EXAMINED.   GROSS DESCRIPTION:  A. Labeled: Cold snare descending colon polyp  Tissue fragment(s): Multiple  Size: Aggregate, 1.0 x 0.7 x 0.1 cm  Description: Tan soft tissue fragment admixed with fecal material  Entirely submitted in 1 cassette(s).    Final Diagnosis performed by Quay Burow, MD.  Electronically signed 10/19/2017 1:06:05PM    The electronic signature indicates that the named Attending Pathologist has evaluated the specimen  Technical component performed at [facility], [address], [place] Lab: [phone] Dir: Rush Farmer, MD, MMM  Professional component performed at [facility], [facility], [place], [place], [place] [address] Lab: [phone] Dir: Dellia Nims. Rubinas, MD    . WBC 10/15/2017 10.4  3.8 - 10.6 K/uL Final  . RBC 10/15/2017 3.98* 4.40 - 5.90 MIL/uL Final  . Hemoglobin 10/15/2017 7.6* 13.0 - 18.0 g/dL Final  .  HCT 10/15/2017 25.4* 40.0 - 52.0 % Final  . MCV 10/15/2017 63.9* 80.0 - 100.0 fL Final  . MCH 10/15/2017 19.2* 26.0 - 34.0 pg Final  . MCHC 10/15/2017 30.1* 32.0 - 36.0 g/dL Final  . RDW 10/15/2017 36.1* 11.5 - 14.5 % Final  . Platelets 10/15/2017 324  150 - 440 K/uL Final   Performed at [facility], [address] [place], [place] [address]    RADIOGRAPHIC STUDIES: I have personally reviewed the radiological images as listed and agree with the findings in the report 04/03/2017 US venous doppler IMPRESSION: Left inguinal/femoral partially thrombosed pseudoaneurysm/hematoma measuring 15 x 5.3 x 6.8 cm.  Reviewed image records from [facility] [place] home 11/26/2016 patient had a venous Doppler extremity  left done which showed no occluding acute DVT at the left common femoral vein. 03/03/2017 patient had another venous Doppler extremity left which showed deep vein thromboses involving the proximal to distal femoral vein the common femoral artery now has normal compression.  04/05/2013 CT chest with contrast    Chronic pulmonary disease with areas of atelectasis and bronchiectasis. Continued significant opacification and bullous change in the left lung especially in the upper lobe and superior segment of the  left lower lobe with other patchy areas bilaterally. There may be mild interval improvement in the area of the lower lingula. Malignancy cannot be excluded in the abnormal areas of the lungs. No central pulmonary arterial filling defect is seen. The bolus is not optimal for evaluation of the lungs  otherwise.    ASSESSMENT/PLAN 1. Recurrent deep vein thrombosis (DVT) ([place])   2. Iron deficiency anemia due to chronic blood loss   3. B12 deficiency   . # Plan Venofer 200 mg weekly x3, first dose today.  His B12 levels are borderline most schedule him to get B12 1000 mcg IM weekly x2. Advised patient to continue follow-up with Dr. Vicente Males for capsule study. Off Anticoagulantion due to acute bleeding.  Status post IVC filter.  Follow-up in 4 weeks Earlie Server, MD  10/22/2017 10:10 PM

## 2017-10-22 NOTE — Progress Notes (Signed)
Jonathon Bellows MD, MRCP(U.K) [address]  [place]  [place], [place] [address]  Main: [phone]  Fax: [phone]   Primary Care Physician: Alvester Morin, MD  Primary Gastroenterologist:  Dr. Jonathon Bellows   No chief complaint on file.   HPI: Timothy Juarez is a 67 y.o. male     Summary of history : He is being followed for iron deficiency anemia  initially seen on 05/12/17 . He has a history of recurrent DVT , lives at a nursing home , on eloquis .   05/25/17- EGD -hiatal hernia, diffuse candidiasis seen , dudoenal bx showed normal villu . Gastric bx showed erosive gastritis with intestinal metaplasia, negative for H pylori. Colonoscopy- polyps in transverse colon were tubulovillous with high grade dysplasia.  Ascending and descending colon polyps were tubular adenoma. Some polyps were taken out piece meal   Interval history 06/30/2017 -10/22/17   08/21/17- Capsule study was incomplete as it did not exit the bowel and also had limited visualization . Poor bowel prpe   He was admitted back to the hospital 09/2017 with Hb 5.3 grams . I repeated EGD+colonoscopy which showed no gross lesion . He was deficient in b12 and iron and received supplementation .   Denis any overt blood loss. Doing well since hospital discharge . Brown stool.He has an appointment with Dr Tasia Catchings tomorrow.     Current Outpatient Medications  Medication Sig Dispense Refill  . acetaminophen (TYLENOL) 325 MG tablet Take 650 mg by mouth at bedtime.     . ALPRAZolam (XANAX) 0.5 MG tablet     . baclofen (LIORESAL) 10 MG tablet Take 10 mg by mouth 3 (three) times daily. Hold for sedation    . budesonide-formoterol (SYMBICORT) 80-4.5 MCG/ACT inhaler Inhale 2 puffs into the lungs 2 (two) times daily.    . fentaNYL (DURAGESIC - DOSED MCG/HR) 12 MCG/HR Place 1 patch (12.5 mcg total) onto the skin every 3 (three) days. 5 patch 0  . furosemide (LASIX) 40 MG tablet Take 1 tablet (40 mg total) by mouth daily.  30 tablet   . gabapentin (NEURONTIN) 100 MG capsule Take 100 mg by mouth at bedtime.    [name] guaiFENesin (ROBITUSSIN) 100 MG/5ML SOLN Take 15 mLs by mouth 3 (three) times daily as needed for cough or to loosen phlegm.    . INCRUSE ELLIPTA 62.5 MCG/INH AEPB     . ipratropium-albuterol (DUONEB) 0.5-2.5 (3) MG/3ML SOLN     . levofloxacin (LEVAQUIN) 500 MG tablet     . Melatonin 5 MG CAPS Take 1 capsule by mouth at bedtime as needed.    . nitroGLYCERIN (NITROSTAT) 0.4 MG SL tablet Place 0.14 mg under the tongue every 5 (five) minutes as needed for chest pain.    . Omega-3 Fatty Acids (FISH OIL) 1000 MG CAPS Take 1,000 mg by mouth every morning.    [name] oxyCODONE 10 MG TABS Take 1 tablet (10 mg total) by mouth 3 (three) times daily as needed (moderate pain). 15 tablet 0  . potassium chloride (K-DUR) 10 MEQ tablet Take 20 mEq by mouth daily.     . potassium chloride SA (K-DUR,KLOR-CON) 20 MEQ tablet     . ranitidine (ZANTAC) 150 MG capsule Take 150 mg by mouth every evening.    . ranitidine (ZANTAC) 150 MG tablet     . tiotropium (SPIRIVA) 18 MCG inhalation capsule Place 18 mcg into inhaler and inhale daily.    . vitamin B-12 1000 MCG tablet Take 1  tablet (1,000 mcg total) by mouth daily. 30 tablet 0   No current facility-administered medications for this visit.     Allergies as of 10/22/2017  . (No Known Allergies)    ROS:  General: Negative for anorexia, weight loss, fever, chills, fatigue, weakness. ENT: Negative for hoarseness, difficulty swallowing , nasal congestion. CV: Negative for chest pain, angina, palpitations, dyspnea on exertion, peripheral edema.  Respiratory: Negative for dyspnea at rest, dyspnea on exertion, cough, sputum, wheezing.  GI: See history of present illness. GU:  Negative for dysuria, hematuria, urinary incontinence, urinary frequency, nocturnal urination.  Endo: Negative for unusual weight change.    Physical Examination:   There were no vitals taken for this  visit.  General: Well-nourished, well-developed in no acute distress.  Eyes: No icterus. Conjunctivae pink. Mouth: Oropharyngeal mucosa moist and pink , no lesions erythema or exudate. Lungs: Clear to auscultation bilaterally. Non-labored. Heart: Regular rate and rhythm, no murmurs rubs or gallops.  Abdomen: Bowel sounds are normal, nontender, nondistended, no hepatosplenomegaly or masses, no abdominal bruits or hernia , no rebound or guarding.   Extremities: Left BKA, rt foot in cast , finger clubbing ++ Neuro: Alert and oriented x 3.  Grossly intact. Skin: Warm and dry, no jaundice.   Psych: Alert and cooperative, normal mood and affect.   Imaging Studies: Dg Chest 2 View  Result Date: 10/10/2017 CLINICAL DATA:  Chest pain. EXAM: CHEST  2 VIEW COMPARISON:  November 07, 2015 FINDINGS: Chronic changes in the left apex are stable. Reticular changes in the right upper lung are mildly more prominent the interval. No pneumothorax. The cardiomediastinal silhouette is stable. No other acute abnormalities. IMPRESSION: 1. Mild reticular changes in the right upper lung are more prominent since March 2017 but may have been present at that time. This may be a progressive chronic process. However, a subtle acute infiltrate is not excluded. 2. Chronic changes in the left apex. Electronically Signed   By: Dorise Bullion III M.D   On: 10/10/2017 07:34   Ct Angio Abd/pel W/ And/or W/o  Result Date: 10/11/2017 CLINICAL DATA:  Anemia EXAM: CTA ABDOMEN AND PELVIS wITHOUT AND WITH CONTRAST TECHNIQUE: Multidetector CT imaging of the abdomen and pelvis was performed using the standard protocol during bolus administration of intravenous contrast. Multiplanar reconstructed images and MIPs were obtained and reviewed to evaluate the vascular anatomy. CONTRAST:  158mL ISOVUE-370 IOPAMIDOL (ISOVUE-370) INJECTION 76% COMPARISON:  04/10/2016 FINDINGS: VASCULAR Aorta: There is soft smooth plaque throughout the abdominal aorta.  Mild atherosclerotic calcifications. No luminal narrowing. No evidence of aneurysm. Celiac: Patent. Atherosclerotic calcifications. Branch vessels patent. SMA: Origin is patent. There is smooth plaque just beyond the origin of the SMA causing mild narrowing. Branch vessels grossly patent. Renals: Single bilateral renal arteries are patent with smooth soft plaque and some atherosclerotic calcification. IMA: Diminutive and patent. Inflow: There is soft and calcified plaque in the right common iliac artery without significant narrowing. There is mild narrowing involving the proximal right external iliac artery. There is significant narrowing of the distal right external iliac artery. See table position -1 142.6. Right internal iliac artery is moderately diseased. Left common iliac artery is patent with atherosclerotic changes. There is a stent in the left external iliac artery. The stent is patent. There is significant. This is best appreciated on coronal reformat images. See image 57 of series 10. Left internal iliac artery is moderately diseased. Narrowing just beyond the stent Proximal Outflow: Right common femoral and visualized superficial femoral arteries  are grossly patent. Left common femoral artery is grossly patent. Left superficial femoral artery is occluded. Veins: No obvious DVT. Review of the MIP images confirms the above findings. NON-VASCULAR Lower chest: There is consolidation in the posterior basal left lower lobe. Hepatobiliary: Mild diffuse hepatic steatosis. Gallbladder is unremarkable. Pancreas: Unremarkable Spleen: Unremarkable Adrenals/Urinary Tract: Kidneys and adrenal glands are within normal limits. Mild diffuse bladder wall thickening. Stomach/Bowel: There is no convincing diverticulosis scattered throughout the length of the colon is present. There is wall thickening of the rectum as well as stranding in the adjacent fat. An inflammatory process is not excluded. There is no evidence of  small-bowel obstruction. Normal appendix. Stomach is decompressed. Small hiatal hernia is suspected. Duodenal C-loop is unremarkable. Focal contrast extravasation throughout the length of the colon to suggest active gastrointestinal bleeding. Lymphatic: No abnormal retroperitoneal adenopathy. Reproductive: Prostate is within normal limits. Other: There is no free fluid. The large hematoma in the proximal left thigh has largely resolved with some residual soft tissue density. Musculoskeletal: There is no vertebral compression deformity. IMPRESSION: VASCULAR Significant narrowing of the distal right external iliac artery. Significant narrowing of the distal left external iliac artery, beyond the stent. Left superficial femoral artery occlusion. Large hematoma in the left inguinal region and proximal left thigh has largely resolved with some residual chronic soft tissue density. NON-VASCULAR Patchy consolidation in the left lower lobe. Findings may represent pneumonia. Mild diffuse bladder wall thickening. Correlation with urinalysis is recommended. There is no convincing contrast extravasation within the colon to suggest active gastrointestinal bleeding. Wall thickening of the rectum with adjacent fatty stranding. Findings may represent an inflammatory process and could result in blood in stool. Correlate clinically. Electronically Signed   By: Marybelle Killings M.D.   On: 10/11/2017 10:56    Assessment and Plan:   Timothy Juarez is a 67 y.o. y/o male  here to follow up  for iron deficiency anemia, b12 deficiency .  EGD showed gastritis with gastric intestinal metaplsia and no dysplasia. Multiple colon polyps and a few in the transverse colon had high grade dysplasia.   Plan  1. Capsule study of the small bowel -12 hour duration with bottle of magnesium citrate the day before 2. Repeat EGD for gastric mapping and colonoscopy for evaluation of any residual polyps in 6-8  months due to poor prep in 09/2017  3.  Check CBC today  4. F/u with Dr Tasia Catchings for iron and b12 deficiency and supplementation  5. Copy of my note given to patient.    Risks, benefits, alternatives of Givens capsule discussed with patient to include but not limited to the rare risk of Given's capsule becoming lodged in the GI tract requiring surgical removal.  The patient agrees with this plan & consent will be obtained.   Dr Jonathon Bellows  MD,MRCP [facility]) Follow up in 6 months

## 2017-10-22 NOTE — Addendum Note (Signed)
Addended by: Peggye Ley on: 10/22/2017 01:29 PM   Modules accepted: Orders, SmartSet

## 2017-10-23 ENCOUNTER — Inpatient Hospital Stay: Payer: Medicare Other | Attending: Oncology | Admitting: Oncology

## 2017-10-23 ENCOUNTER — Other Ambulatory Visit: Payer: Self-pay

## 2017-10-23 ENCOUNTER — Inpatient Hospital Stay: Payer: Medicare Other

## 2017-10-23 ENCOUNTER — Encounter: Payer: Self-pay | Admitting: Oncology

## 2017-10-23 VITALS — BP 121/51 | HR 58 | Temp 97.4°F

## 2017-10-23 DIAGNOSIS — M6281 Muscle weakness (generalized): Secondary | ICD-10-CM | POA: Diagnosis not present

## 2017-10-23 DIAGNOSIS — D509 Iron deficiency anemia, unspecified: Secondary | ICD-10-CM | POA: Diagnosis not present

## 2017-10-23 DIAGNOSIS — D500 Iron deficiency anemia secondary to blood loss (chronic): Secondary | ICD-10-CM

## 2017-10-23 DIAGNOSIS — Z7901 Long term (current) use of anticoagulants: Secondary | ICD-10-CM

## 2017-10-23 DIAGNOSIS — E538 Deficiency of other specified B group vitamins: Secondary | ICD-10-CM

## 2017-10-23 DIAGNOSIS — I82409 Acute embolism and thrombosis of unspecified deep veins of unspecified lower extremity: Secondary | ICD-10-CM

## 2017-10-23 DIAGNOSIS — Z5181 Encounter for therapeutic drug level monitoring: Secondary | ICD-10-CM

## 2017-10-23 DIAGNOSIS — R293 Abnormal posture: Secondary | ICD-10-CM | POA: Diagnosis not present

## 2017-10-23 DIAGNOSIS — I82512 Chronic embolism and thrombosis of left femoral vein: Secondary | ICD-10-CM | POA: Diagnosis not present

## 2017-10-23 DIAGNOSIS — R195 Other fecal abnormalities: Secondary | ICD-10-CM

## 2017-10-23 LAB — CBC WITH DIFFERENTIAL/PLATELET
BASOS PCT: 1 %
Basophils Absolute: 0.1 10*3/uL (ref 0–0.1)
EOS ABS: 0.1 10*3/uL (ref 0–0.7)
EOS PCT: 1 %
HEMATOCRIT: 28.4 % — AB (ref 40.0–52.0)
Hemoglobin: 8.7 g/dL — ABNORMAL LOW (ref 13.0–18.0)
LYMPHS PCT: 26 %
Lymphs Abs: 2.3 10*3/uL (ref 1.0–3.6)
MCH: 19 pg — ABNORMAL LOW (ref 26.0–34.0)
MCHC: 30.6 g/dL — ABNORMAL LOW (ref 32.0–36.0)
MCV: 62.1 fL — ABNORMAL LOW (ref 80.0–100.0)
MONOS PCT: 6 %
Monocytes Absolute: 0.5 10*3/uL (ref 0.2–1.0)
NEUTROS ABS: 6 10*3/uL (ref 1.4–6.5)
NEUTROS PCT: 66 %
Platelets: 487 10*3/uL — ABNORMAL HIGH (ref 150–440)
RBC: 4.58 MIL/uL (ref 4.40–5.90)
RDW: 34.2 % — AB (ref 11.5–14.5)
WBC: 9 10*3/uL (ref 3.8–10.6)

## 2017-10-23 LAB — SAMPLE TO BLOOD BANK

## 2017-10-23 MED ORDER — SODIUM CHLORIDE 0.9 % IV SOLN
INTRAVENOUS | Status: DC
  Administered 2017-10-23: 12:00:00 via INTRAVENOUS
  Filled 2017-10-23: qty 1000

## 2017-10-23 MED ORDER — IRON SUCROSE 20 MG/ML IV SOLN
200.0000 mg | Freq: Once | INTRAVENOUS | Status: AC
  Administered 2017-10-23: 200 mg via INTRAVENOUS
  Filled 2017-10-23: qty 10

## 2017-10-26 DIAGNOSIS — M6281 Muscle weakness (generalized): Secondary | ICD-10-CM | POA: Diagnosis not present

## 2017-10-26 DIAGNOSIS — R293 Abnormal posture: Secondary | ICD-10-CM | POA: Diagnosis not present

## 2017-10-27 DIAGNOSIS — M6281 Muscle weakness (generalized): Secondary | ICD-10-CM | POA: Diagnosis not present

## 2017-10-27 DIAGNOSIS — R293 Abnormal posture: Secondary | ICD-10-CM | POA: Diagnosis not present

## 2017-10-28 DIAGNOSIS — M6281 Muscle weakness (generalized): Secondary | ICD-10-CM | POA: Diagnosis not present

## 2017-10-28 DIAGNOSIS — R293 Abnormal posture: Secondary | ICD-10-CM | POA: Diagnosis not present

## 2017-10-29 ENCOUNTER — Encounter: Payer: Self-pay | Admitting: Gastroenterology

## 2017-10-29 DIAGNOSIS — R293 Abnormal posture: Secondary | ICD-10-CM | POA: Diagnosis not present

## 2017-10-29 DIAGNOSIS — M6281 Muscle weakness (generalized): Secondary | ICD-10-CM | POA: Diagnosis not present

## 2017-10-30 ENCOUNTER — Inpatient Hospital Stay: Payer: Medicare Other

## 2017-10-30 ENCOUNTER — Encounter
Admission: RE | Disposition: A | Discharge status: Home / Self Care | Payer: Self-pay | Source: Ambulatory Visit | Attending: Gastroenterology

## 2017-10-30 ENCOUNTER — Ambulatory Visit
Admission: RE | Admit: 2017-10-30 | Discharge: 2017-10-30 | Disposition: A | Discharge status: Home / Self Care | Payer: Medicare Other | Source: Ambulatory Visit | Attending: Gastroenterology | Admitting: Gastroenterology

## 2017-10-30 DIAGNOSIS — D509 Iron deficiency anemia, unspecified: Secondary | ICD-10-CM | POA: Diagnosis not present

## 2017-10-30 HISTORY — PX: GIVENS CAPSULE STUDY: SHX5432

## 2017-10-30 SURGERY — IMAGING PROCEDURE, GI TRACT, INTRALUMINAL, VIA CAPSULE

## 2017-11-02 ENCOUNTER — Encounter: Payer: Self-pay | Admitting: Gastroenterology

## 2017-11-02 DIAGNOSIS — D649 Anemia, unspecified: Secondary | ICD-10-CM | POA: Diagnosis not present

## 2017-11-05 ENCOUNTER — Inpatient Hospital Stay: Payer: Medicare Other

## 2017-11-05 VITALS — BP 119/57 | HR 71 | Temp 97.0°F

## 2017-11-05 DIAGNOSIS — Z7901 Long term (current) use of anticoagulants: Secondary | ICD-10-CM | POA: Diagnosis not present

## 2017-11-05 DIAGNOSIS — D509 Iron deficiency anemia, unspecified: Secondary | ICD-10-CM | POA: Diagnosis not present

## 2017-11-05 DIAGNOSIS — D500 Iron deficiency anemia secondary to blood loss (chronic): Secondary | ICD-10-CM

## 2017-11-05 DIAGNOSIS — I82512 Chronic embolism and thrombosis of left femoral vein: Secondary | ICD-10-CM | POA: Diagnosis not present

## 2017-11-05 MED ORDER — IRON SUCROSE 20 MG/ML IV SOLN
200.0000 mg | Freq: Once | INTRAVENOUS | Status: AC
  Administered 2017-11-05: 200 mg via INTRAVENOUS
  Filled 2017-11-05: qty 10

## 2017-11-05 MED ORDER — SODIUM CHLORIDE 0.9 % IV SOLN
Freq: Once | INTRAVENOUS | Status: AC
Start: 2017-11-05 — End: 2017-11-05
  Administered 2017-11-05: 14:00:00 via INTRAVENOUS
  Filled 2017-11-05: qty 1000

## 2017-11-05 MED ORDER — CYANOCOBALAMIN 1000 MCG/ML IJ SOLN
1000.0000 ug | Freq: Once | INTRAMUSCULAR | Status: AC
  Administered 2017-11-05: 1000 ug via INTRAMUSCULAR
  Filled 2017-11-05: qty 1

## 2017-11-09 DIAGNOSIS — D649 Anemia, unspecified: Secondary | ICD-10-CM | POA: Diagnosis not present

## 2017-11-11 ENCOUNTER — Inpatient Hospital Stay: Payer: Medicare Other

## 2017-11-11 VITALS — BP 136/66 | HR 60 | Resp 18

## 2017-11-11 DIAGNOSIS — I82512 Chronic embolism and thrombosis of left femoral vein: Secondary | ICD-10-CM | POA: Diagnosis not present

## 2017-11-11 DIAGNOSIS — D500 Iron deficiency anemia secondary to blood loss (chronic): Secondary | ICD-10-CM

## 2017-11-11 DIAGNOSIS — D509 Iron deficiency anemia, unspecified: Secondary | ICD-10-CM | POA: Diagnosis not present

## 2017-11-11 DIAGNOSIS — Z7901 Long term (current) use of anticoagulants: Secondary | ICD-10-CM | POA: Diagnosis not present

## 2017-11-11 MED ORDER — SODIUM CHLORIDE 0.9 % IV SOLN
INTRAVENOUS | Status: DC
  Administered 2017-11-11: 11:00:00 via INTRAVENOUS
  Filled 2017-11-11: qty 1000

## 2017-11-11 MED ORDER — IRON SUCROSE 20 MG/ML IV SOLN
200.0000 mg | Freq: Once | INTRAVENOUS | Status: AC
  Administered 2017-11-11: 200 mg via INTRAVENOUS
  Filled 2017-11-11: qty 10

## 2017-11-13 ENCOUNTER — Encounter: Payer: Self-pay | Admitting: Gastroenterology

## 2017-11-13 DIAGNOSIS — R293 Abnormal posture: Secondary | ICD-10-CM | POA: Diagnosis not present

## 2017-11-13 DIAGNOSIS — M6281 Muscle weakness (generalized): Secondary | ICD-10-CM | POA: Diagnosis not present

## 2017-11-16 DIAGNOSIS — D649 Anemia, unspecified: Secondary | ICD-10-CM | POA: Diagnosis not present

## 2017-11-18 ENCOUNTER — Inpatient Hospital Stay: Payer: Medicare Other | Attending: Oncology

## 2017-11-18 DIAGNOSIS — I82409 Acute embolism and thrombosis of unspecified deep veins of unspecified lower extremity: Secondary | ICD-10-CM

## 2017-11-18 DIAGNOSIS — D6862 Lupus anticoagulant syndrome: Secondary | ICD-10-CM | POA: Diagnosis not present

## 2017-11-18 DIAGNOSIS — Z86718 Personal history of other venous thrombosis and embolism: Secondary | ICD-10-CM | POA: Insufficient documentation

## 2017-11-18 DIAGNOSIS — D509 Iron deficiency anemia, unspecified: Secondary | ICD-10-CM | POA: Diagnosis not present

## 2017-11-18 DIAGNOSIS — E538 Deficiency of other specified B group vitamins: Secondary | ICD-10-CM

## 2017-11-18 DIAGNOSIS — D500 Iron deficiency anemia secondary to blood loss (chronic): Secondary | ICD-10-CM

## 2017-11-18 LAB — CBC WITH DIFFERENTIAL/PLATELET
BASOS PCT: 1 %
Basophils Absolute: 0.1 10*3/uL (ref 0–0.1)
EOS ABS: 0.1 10*3/uL (ref 0–0.7)
Eosinophils Relative: 1 %
HEMATOCRIT: 35.9 % — AB (ref 40.0–52.0)
Hemoglobin: 11.2 g/dL — ABNORMAL LOW (ref 13.0–18.0)
LYMPHS ABS: 3.1 10*3/uL (ref 1.0–3.6)
LYMPHS PCT: 28 %
MCH: 20.1 pg — AB (ref 26.0–34.0)
MCHC: 31.2 g/dL — ABNORMAL LOW (ref 32.0–36.0)
MCV: 64.3 fL — AB (ref 80.0–100.0)
Monocytes Absolute: 0.7 10*3/uL (ref 0.2–1.0)
Monocytes Relative: 6 %
NEUTROS ABS: 7 10*3/uL — AB (ref 1.4–6.5)
Neutrophils Relative %: 64 %
Platelets: 285 10*3/uL (ref 150–440)
RBC: 5.58 MIL/uL (ref 4.40–5.90)
RDW: 35.2 % — ABNORMAL HIGH (ref 11.5–14.5)
WBC: 11 10*3/uL — ABNORMAL HIGH (ref 3.8–10.6)

## 2017-11-18 LAB — FERRITIN: FERRITIN: 56 ng/mL (ref 24–336)

## 2017-11-18 LAB — IRON AND TIBC
IRON: 38 ug/dL — AB (ref 45–182)
SATURATION RATIOS: 10 % — AB (ref 17.9–39.5)
TIBC: 368 ug/dL (ref 250–450)
UIBC: 330 ug/dL

## 2017-11-18 LAB — VITAMIN B12: VITAMIN B 12: 1314 pg/mL — AB (ref 180–914)

## 2017-11-19 ENCOUNTER — Inpatient Hospital Stay: Payer: Medicare Other

## 2017-11-19 ENCOUNTER — Inpatient Hospital Stay (HOSPITAL_BASED_OUTPATIENT_CLINIC_OR_DEPARTMENT_OTHER): Payer: Medicare Other | Admitting: Oncology

## 2017-11-19 ENCOUNTER — Encounter: Payer: Self-pay | Admitting: Oncology

## 2017-11-19 VITALS — BP 146/68 | HR 62 | Temp 97.6°F | Resp 16

## 2017-11-19 DIAGNOSIS — D6862 Lupus anticoagulant syndrome: Secondary | ICD-10-CM

## 2017-11-19 DIAGNOSIS — D500 Iron deficiency anemia secondary to blood loss (chronic): Secondary | ICD-10-CM

## 2017-11-19 DIAGNOSIS — E538 Deficiency of other specified B group vitamins: Secondary | ICD-10-CM

## 2017-11-19 DIAGNOSIS — Z86718 Personal history of other venous thrombosis and embolism: Secondary | ICD-10-CM | POA: Diagnosis not present

## 2017-11-19 DIAGNOSIS — D509 Iron deficiency anemia, unspecified: Secondary | ICD-10-CM

## 2017-11-19 DIAGNOSIS — I82409 Acute embolism and thrombosis of unspecified deep veins of unspecified lower extremity: Secondary | ICD-10-CM

## 2017-11-19 MED ORDER — IRON SUCROSE 20 MG/ML IV SOLN
200.0000 mg | Freq: Once | INTRAVENOUS | Status: AC
  Administered 2017-11-19: 200 mg via INTRAVENOUS
  Filled 2017-11-19: qty 10

## 2017-11-19 MED ORDER — CYANOCOBALAMIN 1000 MCG/ML IJ SOLN
1000.0000 ug | Freq: Once | INTRAMUSCULAR | Status: AC
  Administered 2017-11-19: 1000 ug via INTRAMUSCULAR
  Filled 2017-11-19: qty 1

## 2017-11-19 NOTE — Progress Notes (Signed)
[facility] Follow up Visit:  Patient Care Team: Alvester Morin, MD as PCP - General (Family Medicine)  REASON FOR VISIT Follow up for treatment of Recurrent DVT and recommendation of perioperative anticoagulation instructions.  PERTINENT HEMATOLOGY HISTORY 1Robert L Farace 67 y.o. male with PMH listed as below is here for evaluation of recurrent DVT and management. Patient is accompanied by RN from [facility] at [facility].  Patient is a poor historian. He remembers having leg clots for more than one time. He remembers taking Eliquis in the past and recently his anticoagulation has been switched to [place].  2 Medical records from nursing home by patient's PCP Dr.Slade-Hartman, patient had been on Eliquis 5mg  BID for previous DVTs. His medical records showed he had acute DVT at the left common femoral vein on 11/26/2016, and he was placed on Eliquis 2.5mg  BID. Later when another venous doppler was obtained on 02/15/2017 due to persistent pain and swelling, doppler showed persist deep vein thrombosis involving proxima to distal femoral vein. Common femoral vein has normal compression. There was a note on the second doppler report on 03/03/2017 that patient was taking Eliquis 5mg  BID, which was stopped on 7/19 and patient was placed on Lovenox. Patient had swelling and pain of his left stump and symptoms does not improve with being on Lovenox. Xarelto 15mg  BID was started on 03/05/2017 and Lovenox was discontinued on 03/09/2017 with a plan to switch to Xarelto 20mg  daily after 21 days.  It was mentioned in nursing home note that patient had been on Warfarin previously and acquired blood clot while on warfarin. Patient does not remember this and the details of his?warfarin resistance is unclear.  3 His Lupus anticoagulant testing was positive due to prolonged DRVVT, although possibly falsely positive due to [place]. He was offered to be switched to Lovenox shots  as alternative for treatment of possible lupus anticoagulant hypercoagulable state, patient prefers to stay on Xalreto 20mg  daily and has been doing well on that.  4 Iron deficiency anemia  Patient was admitted in Feb 2019 due to acute on chronic blood loss anemia, hemoglobin 5.3 on presentation.He was transfused with 2 units of PRBC and IV iron infusion..  Xarelto was discontinued due to GI bleeding.  He was evaluated by Dr. Vicente Males.  Patient is set up for outpatient capsule endoscopy next week. IVC filter was placed on 2/25/ 2019 given patient's high risk of DVT recurrence.  He has had extensive GI work up including upper and lower endoscopy and capsule study. Plan to repeat EGD for gastric mapping and colonoscopy in 6-8 months due to poor prep in 09/2017   INTERVAL HISTORY SAVINO WHISENANT is a 67 y.o. male who has above history reviewed by me today presents for follow up visit for management of iron deficiency anemia.  Patient has flat affect.  He says " I feel fine". Denies any shortness of breath, chest pain, abdominal pain or bright blood in the stool.  No swelling or tenderness of lower left extremity amputation stump  Review of Systems  Constitutional: Negative for appetite change, chills and fatigue.  HENT:   Negative for lump/mass, mouth sores and nosebleeds.   Eyes: Negative for eye problems.  Respiratory: Negative for chest tightness, hemoptysis and shortness of breath.   Cardiovascular: Negative for chest pain and leg swelling.  Gastrointestinal: Negative for abdominal distention, abdominal pain, blood in stool, nausea and rectal pain.  Endocrine: Negative for hot flashes.  Genitourinary: Negative  for difficulty urinating, dysuria, hematuria and nocturia.   Musculoskeletal: Negative for back pain and neck pain.       Left stump no swelling or pain except intermittent spasm.   Skin: Negative for itching.  Neurological: Negative.  Negative for dizziness and numbness.   Psychiatric/Behavioral: Negative for confusion.    MEDICAL HISTORY: Past Medical History:  Diagnosis Date  . Allergy   . Anemia   . ARF (acute respiratory failure) (HCC)    H/O  . CHF (congestive heart failure) ([place])   . COPD (chronic obstructive pulmonary disease) ([place])   . Hypertension   . Hypokalemia   . Insomnia   . Muscle contracture    MUSCLE SPASMS  . Peripheral vascular disease ([place])   . Pressure ulcer     SURGICAL HISTORY: Past Surgical History:  Procedure Laterality Date  . AMPUTATION Left 09/19/2015   Procedure: AMPUTATION BELOW KNEE;  Surgeon: Algernon Huxley, MD;  Location: [facility] ORS;  Service: Vascular;  Laterality: Left;  . AMPUTATION Left 11/01/2015   Procedure: AMPUTATION ABOVE KNEE;  Surgeon: Algernon Huxley, MD;  Location: [facility] ORS;  Service: Vascular;  Laterality: Left;  . APPLICATION OF WOUND VAC Left 10/18/2015   Procedure: APPLICATION OF WOUND VAC;  Surgeon: Algernon Huxley, MD;  Location: [facility] ORS;  Service: Vascular;  Laterality: Left;  . COLONOSCOPY WITH PROPOFOL N/A 05/25/2017   Procedure: COLONOSCOPY WITH PROPOFOL;  Surgeon: Jonathon Bellows, MD;  Location: [facility] ENDOSCOPY;  Service: Gastroenterology;  Laterality: N/A;  . COLONOSCOPY WITH PROPOFOL N/A 10/14/2017   Procedure: COLONOSCOPY WITH PROPOFOL;  Surgeon: Jonathon Bellows, MD;  Location: [facility] ENDOSCOPY;  Service: Gastroenterology;  Laterality: N/A;  . ESOPHAGOGASTRODUODENOSCOPY (EGD) WITH PROPOFOL N/A 05/25/2017   Procedure: ESOPHAGOGASTRODUODENOSCOPY (EGD) WITH PROPOFOL;  Surgeon: Jonathon Bellows, MD;  Location[facility] ENDOSCOPY;  Service: Gastroenterology;  Laterality: N/A;  . ESOPHAGOGASTRODUODENOSCOPY (EGD) WITH PROPOFOL N/A 10/14/2017   Procedure: ESOPHAGOGASTRODUODENOSCOPY (EGD) WITH PROPOFOL;  Surgeon: Jonathon Bellows, MD;  Location: [facility] ENDOSCOPY;  Service: Gastroenterology;  Laterality: N/A;  . GIVENS CAPSULE STUDY N/A 07/14/2017   Procedure: GIVENS CAPSULE STUDY;  Surgeon: Jonathon Bellows, MD;  Location: [facility]COPY;   Service: Gastroenterology;  Laterality: N/A;  . GIVENS CAPSULE STUDY N/A 10/30/2017   Procedure: GIVENS CAPSULE STUDY 12 HR;  Surgeon: Jonathon Bellows, MD;  Location: [facility] ENDOSCOPY;  Service: Gastroenterology;  Laterality: N/A;  . IVC FILTER INSERTION N/A 10/12/2017   Procedure: IVC FILTER INSERTION;  Surgeon: Algernon Huxley, MD;  Location: [place] CV LAB;  Service: Cardiovascular;  Laterality: N/A;  . PERIPHERAL VASCULAR CATHETERIZATION Left 01/18/2015   Procedure: Lower Extremity Angiography;  Surgeon: Algernon Huxley, MD;  Location: [place] CV LAB;  Service: Cardiovascular;  Laterality: Left;  . PERIPHERAL VASCULAR CATHETERIZATION N/A 01/18/2015   Procedure: Lower Extremity Intervention;  Surgeon: Algernon Huxley, MD;  Location: [place] CV LAB;  Service: Cardiovascular;  Laterality: N/A;  . PERIPHERAL VASCULAR CATHETERIZATION  07/30/2015   Procedure: Lower Extremity Intervention;  Surgeon: Algernon Huxley, MD;  Location: [place] CV LAB;  Service: Cardiovascular;;  . PERIPHERAL VASCULAR CATHETERIZATION N/A 07/30/2015   Procedure: Abdominal Aortogram w/Lower Extremity;  Surgeon: Algernon Huxley, MD;  Location: [place] CV LAB;  Service: Cardiovascular;  Laterality: N/A;  . PERIPHERAL VASCULAR CATHETERIZATION Left 08/22/2015   Procedure: Lower Extremity Angiography;  Surgeon: Algernon Huxley, MD;  Location: [place] CV LAB;  Service: Cardiovascular;  Laterality: Left;  . PERIPHERAL VASCULAR CATHETERIZATION Left 08/22/2015   Procedure: Lower  Extremity Intervention;  Surgeon: Algernon Huxley, MD;  Location: [place] CV LAB;  Service: Cardiovascular;  Laterality: Left;  . PERIPHERAL VASCULAR CATHETERIZATION Right 03/31/2016   Procedure: Lower Extremity Angiography;  Surgeon: Algernon Huxley, MD;  Location: [place] CV LAB;  Service: Cardiovascular;  Laterality: Right;  . PERIPHERAL VASCULAR CATHETERIZATION  03/31/2016   Procedure: Lower Extremity Intervention;  Surgeon: Algernon Huxley, MD;  Location:  [place] CV LAB;  Service: Cardiovascular;;  . PERIPHERAL VASCULAR CATHETERIZATION Left 04/10/2016   Procedure: Lower Extremity Angiography;  Surgeon: Algernon Huxley, MD;  Location: [place] CV LAB;  Service: Cardiovascular;  Laterality: Left;  . WOUND DEBRIDEMENT Left 10/18/2015   Procedure: DEBRIDEMENT WOUND   ( LEFT BKA DEBRIDEMENT );  Surgeon: Algernon Huxley, MD;  Location: [facility] ORS;  Service: Vascular;  Laterality: Left;    SOCIAL HISTORY: Social History   Socioeconomic History  . Marital status: Legally Separated    Spouse name: Not on file  . Number of children: Not on file  . Years of education: Not on file  . Highest education level: Not on file  Occupational History  . Not on file  Social Needs  . Financial resource strain: Not on file  . Food insecurity:    Worry: Not on file    Inability: Not on file  . Transportation needs:    Medical: Not on file    Non-medical: Not on file  Tobacco Use  . Smoking status: Former Smoker    Packs/day: 1.00    Years: 44.00    Pack years: 44.00    Types: Cigarettes    Last attempt to quit: 11/17/2012    Years since quitting: 5.0  . Smokeless tobacco: Never Used  Substance and Sexual Activity  . Alcohol use: No  . Drug use: No  . Sexual activity: Never  Lifestyle  . Physical activity:    Days per week: Not on file    Minutes per session: Not on file  . Stress: Not on file  Relationships  . Social connections:    Talks on phone: Not on file    Gets together: Not on file    Attends religious service: Not on file    Active member of club or organization: Not on file    Attends meetings of clubs or organizations: Not on file    Relationship status: Not on file  . Intimate partner violence:    Fear of current or ex partner: Not on file    Emotionally abused: Not on file    Physically abused: Not on file    Forced sexual activity: Not on file  Other Topics Concern  . Not on file  Social History Narrative  . Not on file     FAMILY HISTORY: Mother died from an MI. Father died from a brain tumor  ALLERGIES:  has No Known Allergies.  MEDICATIONS:  Current Outpatient Medications  Medication Sig Dispense Refill  . acetaminophen (TYLENOL) 325 MG tablet Take 650 mg by mouth at bedtime.     . ALPRAZolam (XANAX) 0.5 MG tablet     . baclofen (LIORESAL) 10 MG tablet Take 10 mg by mouth 3 (three) times daily. Hold for sedation    . budesonide-formoterol (SYMBICORT) 80-4.5 MCG/ACT inhaler Inhale 2 puffs into the lungs 2 (two) times daily.    . fentaNYL (DURAGESIC - DOSED MCG/HR) 12 MCG/HR Place 1 patch (12.5 mcg total) onto the skin every 3 (three) days. 5 patch  0  . furosemide (LASIX) 40 MG tablet Take 1 tablet (40 mg total) by mouth daily. 30 tablet   . gabapentin (NEURONTIN) 100 MG capsule Take 100 mg by mouth at bedtime.    . INCRUSE ELLIPTA 62.5 MCG/INH AEPB     . ipratropium-albuterol (DUONEB) 0.5-2.5 (3) MG/3ML SOLN     . Melatonin 5 MG CAPS Take 1 capsule by mouth at bedtime as needed.    . Omega-3 Fatty Acids (FISH OIL) 1000 MG CAPS Take 1,000 mg by mouth every morning.    . potassium chloride SA (K-DUR,KLOR-CON) 20 MEQ tablet     . ranitidine (ZANTAC) 150 MG capsule Take 150 mg by mouth every evening.    . tiotropium (SPIRIVA) 18 MCG inhalation capsule Place 18 mcg into inhaler and inhale daily.    . vitamin B-12 1000 MCG tablet Take 1 tablet (1,000 mcg total) by mouth daily. 30 tablet 0  . guaiFENesin (ROBITUSSIN) 100 MG/5ML SOLN Take 15 mLs by mouth 3 (three) times daily as needed for cough or to loosen phlegm.    . nitroGLYCERIN (NITROSTAT) 0.4 MG SL tablet Place 0.14 mg under the tongue every 5 (five) minutes as needed for chest pain.    [name] oxyCODONE 10 MG TABS Take 1 tablet (10 mg total) by mouth 3 (three) times daily as needed (moderate pain). (Patient not taking: Reported on 11/19/2017) 15 tablet 0   No current facility-administered medications for this visit.     PHYSICAL EXAMINATION:  ECOG  PERFORMANCE STATUS: 1 - Symptomatic but completely ambulatory  Vitals:   11/19/17 1422  BP: (!) 146/68  Pulse: 62  Resp: 16  Temp: 97.6 F (36.4 C)    Filed Weights   Physical Exam  Constitutional: He is well-developed, well-nourished, and in no distress. No distress.  HENT:  Head: Normocephalic and atraumatic.  Mouth/Throat: Oropharynx is clear and moist. No oropharyngeal exudate.  Eyes: Pupils are equal, round, and reactive to light. EOM are normal. No scleral icterus.  Neck: Normal range of motion. Neck supple.  Cardiovascular: Normal rate and regular rhythm.  No murmur heard. Pulmonary/Chest: Effort normal and breath sounds normal. He has no wheezes. He has no rales.  Abdominal: Soft. Bowel sounds are normal. He exhibits no distension. There is no tenderness.  Musculoskeletal: He exhibits no edema.  left above knee amputation stump non tender or swelling. No edema at right lower extremity  Neurological: He is alert. No cranial nerve deficit.  Skin: Skin is dry.  Psychiatric: He has a flat affect.      LABORATORY DATA: I have personally reviewed the data as listed:  Appointment on 11/18/2017  Component Date Value Ref Range Status  . Vitamin B-12 11/18/2017 1,314* 180 - 914 pg/mL Final   Comment: (NOTE) This assay is not validated for testing neonatal or myeloproliferative syndrome specimens for Vitamin B12 levels. Performed at [facility], [place] [address], [place], [place] [address]   . Ferritin 11/18/2017 56  24 - 336 ng/mL Final   Performed at [facility], [address] [place], [place] [address]  . Iron 11/18/2017 38* 45 - 182 ug/dL Final  . TIBC 11/18/2017 368  250 - 450 ug/dL Final  . Saturation Ratios 11/18/2017 10* 17.9 - 39.5 % Final  . UIBC 11/18/2017 330  ug/dL Final   Performed at [facility], [address], [place], [place] [address]  . WBC 11/18/2017 11.0* 3.8 - 10.6 K/uL Final  . RBC 11/18/2017 5.58  4.40 - 5.90  MIL/uL Final  . Hemoglobin 11/18/2017 11.2* 13.0 - 18.0 g/dL Final  . HCT 11/18/2017 35.9* 40.0 - 52.0 % Final  . MCV 11/18/2017 64.3* 80.0 - 100.0 fL Final  . MCH 11/18/2017 20.1* 26.0 - 34.0 pg Final  . MCHC 11/18/2017 31.2* 32.0 - 36.0 g/dL Final  . RDW 11/18/2017 35.2* 11.5 - 14.5 % Final  . Platelets 11/18/2017 285  150 - 440 K/uL Final  . Neutrophils Relative % 11/18/2017 64  % Final  . Lymphocytes Relative 11/18/2017 28  % Final  . Monocytes Relative 11/18/2017 6  % Final  . Eosinophils Relative 11/18/2017 1  % Final  . Basophils Relative 11/18/2017 1  % Final  . Neutro Abs 11/18/2017 7.0* 1.4 - 6.5 K/uL Final  . Lymphs Abs 11/18/2017 3.1  1.0 - 3.6 K/uL Final  . Monocytes Absolute 11/18/2017 0.7  0.2 - 1.0 K/uL Final  . Eosinophils Absolute 11/18/2017 0.1  0 - 0.7 K/uL Final  . Basophils Absolute 11/18/2017 0.1  0 - 0.1 K/uL Final  . RBC Morphology 11/18/2017 TARGET CELLS   Final  . WBC Morphology 11/18/2017 SMUDGE CELLS   Final  . Smear Review 11/18/2017 DIIF CONFIRMED BY MANUAL   Final   Comment: PLATELETS APPEAR ADEQUATE   PLATELETS VARY IN SIZE Performed at [facility], [address] [place], [place] [address]   Appointment on 10/23/2017  Component Date Value Ref Range Status  . Blood Bank Specimen 10/23/2017 SAMPLE AVAILABLE FOR TESTING   Final  . Sample Expiration 10/23/2017    Final                   Value:10/26/2017 Performed at [facility], [address], [place], [place] [address]   . WBC 10/23/2017 9.0  3.8 - 10.6 K/uL Final  . RBC 10/23/2017 4.58  4.40 - 5.90 MIL/uL Final  . Hemoglobin 10/23/2017 8.7* 13.0 - 18.0 g/dL Final  . HCT 10/23/2017 28.4* 40.0 - 52.0 % Final  . MCV 10/23/2017 62.1* 80.0 - 100.0 fL Final  . MCH 10/23/2017 19.0* 26.0 - 34.0 pg Final  . MCHC 10/23/2017 30.6* 32.0 - 36.0 g/dL Final  . RDW 10/23/2017 34.2* 11.5 - 14.5 % Final  . Platelets 10/23/2017 487* 150 - 440 K/uL Final  . Neutrophils Relative % 10/23/2017 66   % Final  . Lymphocytes Relative 10/23/2017 26  % Final  . Monocytes Relative 10/23/2017 6  % Final  . Eosinophils Relative 10/23/2017 1  % Final  . Basophils Relative 10/23/2017 1  % Final  . Neutro Abs 10/23/2017 6.0  1.4 - 6.5 K/uL Final  . Lymphs Abs 10/23/2017 2.3  1.0 - 3.6 K/uL Final  . Monocytes Absolute 10/23/2017 0.5  0.2 - 1.0 K/uL Final  . Eosinophils Absolute 10/23/2017 0.1  0 - 0.7 K/uL Final  . Basophils Absolute 10/23/2017 0.1  0 - 0.1 K/uL Final  . RBC Morphology 10/23/2017 POLYCHROMASIA PRESENT   Final   Comment: MIXED RBC POPULATION TARGET CELLS   . WBC Morphology 10/23/2017 SMUDGE CELLS   Final  . Smear Review 10/23/2017 GIANT PLATELETS SEEN   Final   Comment: DIFF CONFIRMED BY MANUAL Performed at [facility], [address] [place], [place] [address]     RADIOGRAPHIC STUDIES: I have personally reviewed the radiological images as listed and agree with the findings in the report 04/03/2017 US venous doppler IMPRESSION: Left inguinal/femoral partially thrombosed pseudoaneurysm/hematoma measuring 15 x 5.3 x  6.8 cm.  Reviewed image records from [facility] home 11/26/2016 patient had a venous Doppler extremity left done which showed no occluding acute DVT at the left common femoral vein. 03/03/2017 patient had another venous Doppler extremity left which showed deep vein thromboses involving the proximal to distal femoral vein the common femoral artery now has normal compression.  04/05/2013 CT chest with contrast    Chronic pulmonary disease with areas of atelectasis and bronchiectasis. Continued significant opacification and bullous change in the left lung especially in the upper lobe and superior segment of the  left lower lobe with other patchy areas bilaterally. There may be mild interval improvement in the area of the lower lingula. Malignancy cannot be excluded in the abnormal areas of the lungs. No central pulmonary arterial filling  defect is seen. The bolus is not optimal for evaluation of the lungs  otherwise.    ASSESSMENT/PLAN 1. Iron deficiency anemia due to chronic blood loss   2. Recurrent deep vein thrombosis (DVT) ([place])   . # Iron panel was reviewed, Saturation <10%, consistent with iron deficiency. Venofer 200 mg weekly x2,   # s/p parental B12 IM injections. B12 level improved. . Off Anticoagulantion due to acute bleeding.  Status post IVC filter. Will check his antiphospholipid antibody panel to clarify his previously prolonged DVRRT.    Follow-up in 6 weeks Earlie Server, MD  11/19/2017 2:49 PM

## 2017-11-23 DIAGNOSIS — D649 Anemia, unspecified: Secondary | ICD-10-CM | POA: Diagnosis not present

## 2017-11-26 ENCOUNTER — Other Ambulatory Visit: Payer: Self-pay | Admitting: Oncology

## 2017-11-26 ENCOUNTER — Inpatient Hospital Stay: Payer: Medicare Other

## 2017-11-26 VITALS — BP 107/69 | HR 68 | Temp 97.9°F | Resp 18

## 2017-11-26 DIAGNOSIS — D500 Iron deficiency anemia secondary to blood loss (chronic): Secondary | ICD-10-CM

## 2017-11-26 DIAGNOSIS — D6862 Lupus anticoagulant syndrome: Secondary | ICD-10-CM | POA: Diagnosis not present

## 2017-11-26 DIAGNOSIS — Z86718 Personal history of other venous thrombosis and embolism: Secondary | ICD-10-CM | POA: Diagnosis not present

## 2017-11-26 DIAGNOSIS — D509 Iron deficiency anemia, unspecified: Secondary | ICD-10-CM | POA: Diagnosis not present

## 2017-11-26 MED ORDER — IRON SUCROSE 20 MG/ML IV SOLN
200.0000 mg | INTRAVENOUS | Status: DC
  Administered 2017-11-26: 200 mg via INTRAVENOUS
  Filled 2017-11-26: qty 10

## 2017-11-26 NOTE — Progress Notes (Signed)
No Vitamin B 12 injection today per Dr. Tasia Catchings, since B 12 level was 1,314.

## 2017-11-30 DIAGNOSIS — D649 Anemia, unspecified: Secondary | ICD-10-CM | POA: Diagnosis not present

## 2017-12-07 DIAGNOSIS — D649 Anemia, unspecified: Secondary | ICD-10-CM | POA: Diagnosis not present

## 2017-12-14 DIAGNOSIS — D649 Anemia, unspecified: Secondary | ICD-10-CM | POA: Diagnosis not present

## 2017-12-22 ENCOUNTER — Encounter (INDEPENDENT_AMBULATORY_CARE_PROVIDER_SITE_OTHER): Payer: Medicare Other

## 2017-12-22 ENCOUNTER — Ambulatory Visit (INDEPENDENT_AMBULATORY_CARE_PROVIDER_SITE_OTHER): Payer: Medicare Other | Admitting: Vascular Surgery

## 2017-12-22 DIAGNOSIS — I7389 Other specified peripheral vascular diseases: Secondary | ICD-10-CM | POA: Diagnosis not present

## 2017-12-22 DIAGNOSIS — I5021 Acute systolic (congestive) heart failure: Secondary | ICD-10-CM | POA: Diagnosis not present

## 2017-12-22 DIAGNOSIS — R0789 Other chest pain: Secondary | ICD-10-CM | POA: Diagnosis not present

## 2017-12-22 DIAGNOSIS — D6489 Other specified anemias: Secondary | ICD-10-CM | POA: Diagnosis not present

## 2017-12-23 DIAGNOSIS — Z79899 Other long term (current) drug therapy: Secondary | ICD-10-CM | POA: Diagnosis not present

## 2017-12-23 DIAGNOSIS — D649 Anemia, unspecified: Secondary | ICD-10-CM | POA: Diagnosis not present

## 2017-12-23 DIAGNOSIS — D519 Vitamin B12 deficiency anemia, unspecified: Secondary | ICD-10-CM | POA: Diagnosis not present

## 2017-12-31 ENCOUNTER — Inpatient Hospital Stay: Payer: Medicare Other | Attending: Oncology

## 2017-12-31 DIAGNOSIS — D509 Iron deficiency anemia, unspecified: Secondary | ICD-10-CM | POA: Diagnosis not present

## 2017-12-31 DIAGNOSIS — D500 Iron deficiency anemia secondary to blood loss (chronic): Secondary | ICD-10-CM

## 2017-12-31 DIAGNOSIS — E538 Deficiency of other specified B group vitamins: Secondary | ICD-10-CM | POA: Insufficient documentation

## 2017-12-31 DIAGNOSIS — Z86718 Personal history of other venous thrombosis and embolism: Secondary | ICD-10-CM | POA: Insufficient documentation

## 2017-12-31 DIAGNOSIS — I82409 Acute embolism and thrombosis of unspecified deep veins of unspecified lower extremity: Secondary | ICD-10-CM

## 2017-12-31 LAB — CBC WITH DIFFERENTIAL/PLATELET
BASOS ABS: 0 10*3/uL (ref 0–0.1)
BASOS PCT: 0 %
EOS ABS: 0.2 10*3/uL (ref 0–0.7)
Eosinophils Relative: 1 %
HCT: 43 % (ref 40.0–52.0)
Hemoglobin: 13.7 g/dL (ref 13.0–18.0)
Lymphocytes Relative: 23 %
Lymphs Abs: 2.6 10*3/uL (ref 1.0–3.6)
MCH: 22.2 pg — ABNORMAL LOW (ref 26.0–34.0)
MCHC: 31.9 g/dL — AB (ref 32.0–36.0)
MCV: 69.5 fL — ABNORMAL LOW (ref 80.0–100.0)
MONO ABS: 0.8 10*3/uL (ref 0.2–1.0)
MONOS PCT: 7 %
NEUTROS ABS: 7.8 10*3/uL — AB (ref 1.4–6.5)
Neutrophils Relative %: 69 %
Platelets: 220 10*3/uL (ref 150–440)
RBC: 6.19 MIL/uL — ABNORMAL HIGH (ref 4.40–5.90)
RDW: 31.2 % — ABNORMAL HIGH (ref 11.5–14.5)
WBC: 11.3 10*3/uL — ABNORMAL HIGH (ref 3.8–10.6)

## 2017-12-31 LAB — IRON AND TIBC
Iron: 47 ug/dL (ref 45–182)
SATURATION RATIOS: 13 % — AB (ref 17.9–39.5)
TIBC: 368 ug/dL (ref 250–450)
UIBC: 321 ug/dL

## 2017-12-31 LAB — FERRITIN: Ferritin: 45 ng/mL (ref 24–336)

## 2018-01-06 ENCOUNTER — Inpatient Hospital Stay (HOSPITAL_BASED_OUTPATIENT_CLINIC_OR_DEPARTMENT_OTHER): Payer: Medicare Other | Admitting: Oncology

## 2018-01-06 ENCOUNTER — Encounter: Payer: Self-pay | Admitting: Oncology

## 2018-01-06 VITALS — BP 127/64 | HR 52 | Temp 97.1°F | Resp 18 | Wt 206.0 lb

## 2018-01-06 DIAGNOSIS — D509 Iron deficiency anemia, unspecified: Secondary | ICD-10-CM | POA: Diagnosis not present

## 2018-01-06 DIAGNOSIS — E538 Deficiency of other specified B group vitamins: Secondary | ICD-10-CM

## 2018-01-06 DIAGNOSIS — D500 Iron deficiency anemia secondary to blood loss (chronic): Secondary | ICD-10-CM

## 2018-01-06 DIAGNOSIS — Z86718 Personal history of other venous thrombosis and embolism: Secondary | ICD-10-CM | POA: Diagnosis not present

## 2018-01-06 DIAGNOSIS — I82409 Acute embolism and thrombosis of unspecified deep veins of unspecified lower extremity: Secondary | ICD-10-CM

## 2018-01-06 MED ORDER — FERROUS SULFATE 325 (65 FE) MG PO TBEC: 325.0000 mg | DELAYED_RELEASE_TABLET | Freq: Three times a day (TID) | ORAL | 3 refills | Status: DC

## 2018-01-06 MED ORDER — DOCUSATE SODIUM 100 MG PO CAPS: 100.0000 mg | ORAL_CAPSULE | Freq: Every day | ORAL | 3 refills | Status: DC | PRN

## 2018-01-06 NOTE — Progress Notes (Signed)
Timothy Juarez:  Patient Care Team: Alvester Morin, MD as PCP - General (Family Medicine)  REASON FOR Juarez Follow up for treatment of Recurrent DVT and recommendation of perioperative anticoagulation instructions.  PERTINENT HEMATOLOGY HISTORY Timothy Juarez 67 y.o. male with PMH listed as below is here for evaluation of recurrent DVT and management. Patient is accompanied by RN from [facility].  Patient is a poor historian. He remembers having leg clots for more than one time. He remembers taking Eliquis in the past and recently his anticoagulation has been switched to [place].  2 Medical records from nursing home by patient's PCP Dr.Slade-Hartman, patient had been on Eliquis 5mg  BID for previous DVTs. His medical records showed he had acute DVT at the left common femoral vein on 11/26/2016, and he was placed on Eliquis 2.5mg  BID. Later when another venous doppler was obtained on 02/15/2017 due to persistent pain and swelling, doppler showed persist deep vein thrombosis involving proxima to distal femoral vein. Common femoral vein has normal compression. There was a note on the second doppler report on 03/03/2017 that patient was taking Eliquis 5mg  BID, which was stopped on 7/19 and patient was placed on Lovenox. Patient had swelling and pain of his left stump and symptoms does not improve with being on Lovenox. Xarelto 15mg  BID was started on 03/05/2017 and Lovenox was discontinued on 03/09/2017 with a plan to switch to Xarelto 20mg  daily after 21 days.  It was mentioned in nursing home note that patient had been on Warfarin previously and acquired blood clot while on warfarin. Patient does not remember this and the details of his?warfarin resistance is unclear.  3 His Lupus anticoagulant testing was positive due to prolonged DRVVT, although possibly falsely positive due to [place]. He was offered to be switched to Lovenox shots  as alternative for treatment of possible lupus anticoagulant hypercoagulable state, patient prefers to stay on Xalreto 20mg  daily and has been doing well on that.  4 Iron deficiency anemia  Patient was admitted in Feb 2019 due to acute on chronic blood loss anemia, hemoglobin 5.3 on presentation.He was transfused with 2 units of PRBC and IV iron infusion..  Xarelto was discontinued due to GI bleeding.  He was evaluated by Dr. Vicente Males.  Patient is set up for outpatient capsule endoscopy next week. IVC filter was placed on 2/25/ 2019 given patient's high risk of DVT recurrence.  He has had extensive GI work up including upper and lower endoscopy and capsule study. Plan to repeat EGD for gastric mapping and colonoscopy in 6-8 months due to poor prep in 09/2017   INTERVAL HISTORY Timothy Juarez is a 67 y.o. male who has above history reviewed by me today presents for follow up Juarez for management of iron deficiency anemia.  Patient has flat affect.  He says " I feel fine". Denies any shortness of breath, chest pain, abdominal pain or bright blood in the stool.  No swelling or tenderness of lower left extremity amputation stump  Review of Systems  Constitutional: Negative for appetite change, chills and fatigue.  HENT:   Negative for lump/mass, mouth sores and nosebleeds.   Eyes: Negative for eye problems.  Respiratory: Negative for chest tightness, hemoptysis and shortness of breath.   Cardiovascular: Negative for chest pain and leg swelling.  Gastrointestinal: Negative for abdominal distention, abdominal pain, blood in stool, nausea and rectal pain.  Endocrine: Negative for hot flashes.  Genitourinary: Negative  for difficulty urinating, dysuria, hematuria and nocturia.   Musculoskeletal: Negative for back pain and neck pain.       Left stump no swelling or pain except intermittent spasm.   Skin: Negative for itching.  Neurological: Negative.  Negative for dizziness and numbness.   Psychiatric/Behavioral: Negative for confusion.    MEDICAL HISTORY: Past Medical History:  Diagnosis Date  . Allergy   . Anemia   . ARF (acute respiratory failure) (HCC)    H/O  . CHF (congestive heart failure) ([place])   . COPD (chronic obstructive pulmonary disease) ([place])   . Hypertension   . Hypokalemia   . Insomnia   . Muscle contracture    MUSCLE SPASMS  . Peripheral vascular disease ([place])   . Pressure ulcer     SURGICAL HISTORY: Past Surgical History:  Procedure Laterality Date  . AMPUTATION Left 09/19/2015   Procedure: AMPUTATION BELOW KNEE;  Surgeon: Algernon Huxley, MD;  Location: [facility] ORS;  Service: Vascular;  Laterality: Left;  . AMPUTATION Left 11/01/2015   Procedure: AMPUTATION ABOVE KNEE;  Surgeon: Algernon Huxley, MD;  Location: [facility] ORS;  Service: Vascular;  Laterality: Left;  . APPLICATION OF WOUND VAC Left 10/18/2015   Procedure: APPLICATION OF WOUND VAC;  Surgeon: Algernon Huxley, MD;  Location: [facility] ORS;  Service: Vascular;  Laterality: Left;  . COLONOSCOPY WITH PROPOFOL N/A 05/25/2017   Procedure: COLONOSCOPY WITH PROPOFOL;  Surgeon: Jonathon Bellows, MD;  Location: [facility] ENDOSCOPY;  Service: Gastroenterology;  Laterality: N/A;  . COLONOSCOPY WITH PROPOFOL N/A 10/14/2017   Procedure: COLONOSCOPY WITH PROPOFOL;  Surgeon: Jonathon Bellows, MD;  Location: [facility] ENDOSCOPY;  Service: Gastroenterology;  Laterality: N/A;  . ESOPHAGOGASTRODUODENOSCOPY (EGD) WITH PROPOFOL N/A 05/25/2017   Procedure: ESOPHAGOGASTRODUODENOSCOPY (EGD) WITH PROPOFOL;  Surgeon: Jonathon Bellows, MD;  Location[facility] ENDOSCOPY;  Service: Gastroenterology;  Laterality: N/A;  . ESOPHAGOGASTRODUODENOSCOPY (EGD) WITH PROPOFOL N/A 10/14/2017   Procedure: ESOPHAGOGASTRODUODENOSCOPY (EGD) WITH PROPOFOL;  Surgeon: Jonathon Bellows, MD;  Location: [facility] ENDOSCOPY;  Service: Gastroenterology;  Laterality: N/A;  . GIVENS CAPSULE STUDY N/A 07/14/2017   Procedure: GIVENS CAPSULE STUDY;  Surgeon: Jonathon Bellows, MD;  Location: [facility] ENDOSCOPY;   Service: Gastroenterology;  Laterality: N/A;  . GIVENS CAPSULE STUDY N/A 10/30/2017   Procedure: GIVENS CAPSULE STUDY 12 HR;  Surgeon: Jonathon Bellows, MD;  Location: [facility] ENDOSCOPY;  Service: Gastroenterology;  Laterality: N/A;  . IVC FILTER INSERTION N/A 10/12/2017   Procedure: IVC FILTER INSERTION;  Surgeon: Algernon Huxley, MD;  Location: [place] CV LAB;  Service: Cardiovascular;  Laterality: N/A;  . PERIPHERAL VASCULAR CATHETERIZATION Left 01/18/2015   Procedure: Lower Extremity Angiography;  Surgeon: Algernon Huxley, MD;  Location: [place] CV LAB;  Service: Cardiovascular;  Laterality: Left;  . PERIPHERAL VASCULAR CATHETERIZATION N/A 01/18/2015   Procedure: Lower Extremity Intervention;  Surgeon: Algernon Huxley, MD;  Location: [place] CV LAB;  Service: Cardiovascular;  Laterality: N/A;  . PERIPHERAL VASCULAR CATHETERIZATION  07/30/2015   Procedure: Lower Extremity Intervention;  Surgeon: Algernon Huxley, MD;  Location: [place] CV LAB;  Service: Cardiovascular;;  . PERIPHERAL VASCULAR CATHETERIZATION N/A 07/30/2015   Procedure: Abdominal Aortogram w/Lower Extremity;  Surgeon: Algernon Huxley, MD;  Location: [place] CV LAB;  Service: Cardiovascular;  Laterality: N/A;  . PERIPHERAL VASCULAR CATHETERIZATION Left 08/22/2015   Procedure: Lower Extremity Angiography;  Surgeon: Algernon Huxley, MD;  Location: [place] CV LAB;  Service: Cardiovascular;  Laterality: Left;  . PERIPHERAL VASCULAR CATHETERIZATION Left 08/22/2015   Procedure: Lower  Extremity Intervention;  Surgeon: Algernon Huxley, MD;  Location: [place] CV LAB;  Service: Cardiovascular;  Laterality: Left;  . PERIPHERAL VASCULAR CATHETERIZATION Right 03/31/2016   Procedure: Lower Extremity Angiography;  Surgeon: Algernon Huxley, MD;  Location: [place] CV LAB;  Service: Cardiovascular;  Laterality: Right;  . PERIPHERAL VASCULAR CATHETERIZATION  03/31/2016   Procedure: Lower Extremity Intervention;  Surgeon: Algernon Huxley, MD;  Location:  [place] CV LAB;  Service: Cardiovascular;;  . PERIPHERAL VASCULAR CATHETERIZATION Left 04/10/2016   Procedure: Lower Extremity Angiography;  Surgeon: Algernon Huxley, MD;  Location: [place] CV LAB;  Service: Cardiovascular;  Laterality: Left;  . WOUND DEBRIDEMENT Left 10/18/2015   Procedure: DEBRIDEMENT WOUND   ( LEFT BKA DEBRIDEMENT );  Surgeon: Algernon Huxley, MD;  Location: [facility] ORS;  Service: Vascular;  Laterality: Left;    SOCIAL HISTORY: Social History   Socioeconomic History  . Marital status: Legally Separated    Spouse name: Not on file  . Number of children: Not on file  . Years of education: Not on file  . Highest education level: Not on file  Occupational History  . Not on file  Social Needs  . Financial resource strain: Not on file  . Food insecurity:    Worry: Not on file    Inability: Not on file  . Transportation needs:    Medical: Not on file    Non-medical: Not on file  Tobacco Use  . Smoking status: Former Smoker    Packs/day: 1.00    Years: 44.00    Pack years: 44.00    Types: Cigarettes    Last attempt to quit: 11/17/2012    Years since quitting: 5.1  . Smokeless tobacco: Never Used  Substance and Sexual Activity  . Alcohol use: No  . Drug use: No  . Sexual activity: Never  Lifestyle  . Physical activity:    Days per week: Not on file    Minutes per session: Not on file  . Stress: Not on file  Relationships  . Social connections:    Talks on phone: Not on file    Gets together: Not on file    Attends religious service: Not on file    Active member of club or organization: Not on file    Attends meetings of clubs or organizations: Not on file    Relationship status: Not on file  . Intimate partner violence:    Fear of current or ex partner: Not on file    Emotionally abused: Not on file    Physically abused: Not on file    Forced sexual activity: Not on file  Other Topics Concern  . Not on file  Social History Narrative  . Not on file     FAMILY HISTORY: Mother died from an MI. Father died from a brain tumor  ALLERGIES:  has No Known Allergies.  MEDICATIONS:  Current Outpatient Medications  Medication Sig Dispense Refill  . acetaminophen (TYLENOL) 325 MG tablet Take 650 mg by mouth at bedtime.     . ALPRAZolam (XANAX) 0.5 MG tablet     . baclofen (LIORESAL) 10 MG tablet Take 10 mg by mouth 3 (three) times daily. Hold for sedation    . budesonide-formoterol (SYMBICORT) 80-4.5 MCG/ACT inhaler Inhale 2 puffs into the lungs 2 (two) times daily.    . fentaNYL (DURAGESIC - DOSED MCG/HR) 12 MCG/HR Place 1 patch (12.5 mcg total) onto the skin every 3 (three) days. 5 patch  0  . furosemide (LASIX) 40 MG tablet Take 1 tablet (40 mg total) by mouth daily. 30 tablet   . gabapentin (NEURONTIN) 100 MG capsule Take 100 mg by mouth at bedtime.    [name] guaiFENesin (ROBITUSSIN) 100 MG/5ML SOLN Take 15 mLs by mouth 3 (three) times daily as needed for cough or to loosen phlegm.    . INCRUSE ELLIPTA 62.5 MCG/INH AEPB     . ipratropium-albuterol (DUONEB) 0.5-2.5 (3) MG/3ML SOLN     . Melatonin 5 MG CAPS Take 1 capsule by mouth at bedtime as needed.    . nitroGLYCERIN (NITROSTAT) 0.4 MG SL tablet Place 0.14 mg under the tongue every 5 (five) minutes as needed for chest pain.    . Omega-3 Fatty Acids (FISH OIL) 1000 MG CAPS Take 1,000 mg by mouth every morning.    [name] oxyCODONE 10 MG TABS Take 1 tablet (10 mg total) by mouth 3 (three) times daily as needed (moderate pain). 15 tablet 0  . potassium chloride SA (K-DUR,KLOR-CON) 20 MEQ tablet     . ranitidine (ZANTAC) 150 MG capsule Take 150 mg by mouth every evening.    . tiotropium (SPIRIVA) 18 MCG inhalation capsule Place 18 mcg into inhaler and inhale daily.    . vitamin B-12 1000 MCG tablet Take 1 tablet (1,000 mcg total) by mouth daily. 30 tablet 0   No current facility-administered medications for this Juarez.     PHYSICAL EXAMINATION:  ECOG PERFORMANCE STATUS: 1 - Symptomatic but  completely ambulatory  Vitals:   01/06/18 1100  BP: 127/64  Pulse: (!) 52  Resp: 18  Temp: (!) 97.1 F (36.2 C)    Filed Weights   01/06/18 1100  Weight: 206 lb (93.4 kg)   Physical Exam  Constitutional: He is oriented to person, place, and time and well-developed, well-nourished, and in no distress. No distress.  HENT:  Head: Normocephalic and atraumatic.  Nose: Nose normal.  Mouth/Throat: Oropharynx is clear and moist. No oropharyngeal exudate.  Eyes: Pupils are equal, round, and reactive to light. EOM are normal. Left eye exhibits no discharge. No scleral icterus.  Neck: Normal range of motion. Neck supple. No JVD present.  Cardiovascular: Normal rate, regular rhythm and normal heart sounds.  No murmur heard. Pulmonary/Chest: Effort normal and breath sounds normal. No respiratory distress. He has no wheezes. He has no rales. He exhibits no tenderness.  Abdominal: Soft. Bowel sounds are normal. He exhibits no distension and no mass. There is no tenderness. There is no rebound.  Musculoskeletal: Normal range of motion. He exhibits no edema or tenderness.  left above knee amputation stump non tender or swelling. No edema at right lower extremity  Lymphadenopathy:    He has no cervical adenopathy.  Neurological: He is alert and oriented to person, place, and time. No cranial nerve deficit. He exhibits normal muscle tone. Coordination normal.  Skin: Skin is warm and dry. No rash noted. He is not diaphoretic. No erythema.  Psychiatric: Judgment normal. He has a flat affect.      LABORATORY DATA: I have personally reviewed the data as listed:  Appointment on 12/31/2017  Component Date Value Ref Range Status  . Ferritin 12/31/2017 45  24 - 336 ng/mL Final   Performed at [facility], [address] [place], [place] [address]  . Iron 12/31/2017 47  45 - 182 ug/dL Final  . TIBC 12/31/2017 368  250 - 450 ug/dL Final  . Saturation Ratios 12/31/2017 13* 17.9 - 39.5 %  Final  . UIBC 12/31/2017 321  ug/dL Final   Performed at [facility], [address] [place], [place] [address]  . WBC 12/31/2017 11.3* 3.8 - 10.6 K/uL Final  . RBC 12/31/2017 6.19* 4.40 - 5.90 MIL/uL Final  . Hemoglobin 12/31/2017 13.7  13.0 - 18.0 g/dL Final  . HCT 12/31/2017 43.0  40.0 - 52.0 % Final  . MCV 12/31/2017 69.5* 80.0 - 100.0 fL Final  . MCH 12/31/2017 22.2* 26.0 - 34.0 pg Final  . MCHC 12/31/2017 31.9* 32.0 - 36.0 g/dL Final  . RDW 12/31/2017 31.2* 11.5 - 14.5 % Final  . Platelets 12/31/2017 220  150 - 440 K/uL Final   Comment: PLATELET COUNT CONFIRMED BY SMEAR PLATELETS VARY IN SIZE LARGE PLATELETS SEEN  PLATELETS APPEAR ADEQUATE   . Neutrophils Relative % 12/31/2017 69  % Final  . Neutro Abs 12/31/2017 7.8* 1.4 - 6.5 K/uL Final  . Lymphocytes Relative 12/31/2017 23  % Final  . Lymphs Abs 12/31/2017 2.6  1.0 - 3.6 K/uL Final  . Monocytes Relative 12/31/2017 7  % Final  . Monocytes Absolute 12/31/2017 0.8  0.2 - 1.0 K/uL Final  . Eosinophils Relative 12/31/2017 1  % Final  . Eosinophils Absolute 12/31/2017 0.2  0 - 0.7 K/uL Final  . Basophils Relative 12/31/2017 0  % Final  . Basophils Absolute 12/31/2017 0.0  0 - 0.1 K/uL Final   Performed at [facility], [address] [place], [place] [address]    RADIOGRAPHIC STUDIES: I have personally reviewed the radiological images as listed and agree with the findings in the report 04/03/2017 US venous doppler IMPRESSION: Left inguinal/femoral partially thrombosed pseudoaneurysm/hematoma measuring 15 x 5.3 x 6.8 cm.  Reviewed image records from [facility] home 11/26/2016 patient had a venous Doppler extremity left done which showed no occluding acute DVT at the left common femoral vein. 03/03/2017 patient had another venous Doppler extremity left which showed deep vein thromboses involving the proximal to distal femoral vein the common femoral artery now has normal  compression.  04/05/2013 CT chest with contrast    Chronic pulmonary disease with areas of atelectasis and bronchiectasis. Continued significant opacification and bullous change in the left lung especially in the upper lobe and superior segment of the  left lower lobe with other patchy areas bilaterally. There may be mild interval improvement in the area of the lower lingula. Malignancy cannot be excluded in the abnormal areas of the lungs. No central pulmonary arterial filling defect is seen. The bolus is not optimal for evaluation of the lungs  otherwise.    ASSESSMENT/PLAN 1. Iron deficiency anemia due to chronic blood loss   2. Recurrent deep vein thrombosis (DVT) (HCC)   3. B12 deficiency   . # Iron panel was reviewed, Saturation improved to 13%. He has got multiple doses of IV venofer.  Hemoglobin has improved to 13.7. MCV improved to 69.5 Will hold additional IV iron for now. Start oral ferrous sulfate 325mg  BID and Colace PRN for constipation.   # Microcytosis: due to iron deficiency. Improved. Continue monitoring.     # s/p parental B12 IM injections. B12 level improved, last level 1314.  Off Anticoagulantion due to acute bleeding.  Status post IVC filter. Will check his antiphospholipid antibody panel to clarify his previously prolonged DVRRT. Previous ordered, not done.    Follow-up in 3 months.  Earlie Server, MD  01/06/2018 3:04 PM

## 2018-01-06 NOTE — Progress Notes (Signed)
Pt in for follow up, aide from [facility] with patient.  Pt denies any concerns today.

## 2018-02-23 ENCOUNTER — Ambulatory Visit (INDEPENDENT_AMBULATORY_CARE_PROVIDER_SITE_OTHER): Payer: Medicare Other | Admitting: Vascular Surgery

## 2018-02-23 ENCOUNTER — Encounter (INDEPENDENT_AMBULATORY_CARE_PROVIDER_SITE_OTHER): Payer: Medicare Other

## 2018-02-25 DIAGNOSIS — J984 Other disorders of lung: Secondary | ICD-10-CM | POA: Diagnosis not present

## 2018-02-25 DIAGNOSIS — I7389 Other specified peripheral vascular diseases: Secondary | ICD-10-CM | POA: Diagnosis not present

## 2018-02-25 DIAGNOSIS — I5021 Acute systolic (congestive) heart failure: Secondary | ICD-10-CM | POA: Diagnosis not present

## 2018-02-25 DIAGNOSIS — I10 Essential (primary) hypertension: Secondary | ICD-10-CM | POA: Diagnosis not present

## 2018-03-02 ENCOUNTER — Other Ambulatory Visit: Payer: Self-pay

## 2018-03-02 DIAGNOSIS — Z86010 Personal history of colonic polyps: Secondary | ICD-10-CM

## 2018-03-02 DIAGNOSIS — K31A0 Gastric intestinal metaplasia, unspecified: Secondary | ICD-10-CM

## 2018-03-02 DIAGNOSIS — K3189 Other diseases of stomach and duodenum: Secondary | ICD-10-CM

## 2018-03-03 ENCOUNTER — Telehealth: Payer: Self-pay | Admitting: Gastroenterology

## 2018-03-03 NOTE — Telephone Encounter (Signed)
[name] from [facility] called and stated patient is refusing a colonoscopy and EGD. I called [facility] to let them know.

## 2018-03-09 DIAGNOSIS — I7389 Other specified peripheral vascular diseases: Secondary | ICD-10-CM | POA: Diagnosis not present

## 2018-03-09 DIAGNOSIS — I82409 Acute embolism and thrombosis of unspecified deep veins of unspecified lower extremity: Secondary | ICD-10-CM | POA: Diagnosis not present

## 2018-03-09 DIAGNOSIS — I5189 Other ill-defined heart diseases: Secondary | ICD-10-CM | POA: Diagnosis not present

## 2018-03-09 DIAGNOSIS — J984 Other disorders of lung: Secondary | ICD-10-CM | POA: Diagnosis not present

## 2018-03-24 ENCOUNTER — Encounter (INDEPENDENT_AMBULATORY_CARE_PROVIDER_SITE_OTHER): Payer: Self-pay | Admitting: Vascular Surgery

## 2018-03-24 ENCOUNTER — Ambulatory Visit (INDEPENDENT_AMBULATORY_CARE_PROVIDER_SITE_OTHER): Payer: Medicare Other

## 2018-03-24 ENCOUNTER — Ambulatory Visit (INDEPENDENT_AMBULATORY_CARE_PROVIDER_SITE_OTHER): Payer: Medicare Other | Admitting: Nurse Practitioner

## 2018-03-24 ENCOUNTER — Encounter

## 2018-03-24 VITALS — BP 131/71 | HR 58 | Resp 12 | Ht 73.0 in | Wt 212.0 lb

## 2018-03-24 DIAGNOSIS — I739 Peripheral vascular disease, unspecified: Secondary | ICD-10-CM

## 2018-03-24 DIAGNOSIS — I70262 Atherosclerosis of native arteries of extremities with gangrene, left leg: Secondary | ICD-10-CM

## 2018-03-24 DIAGNOSIS — I70235 Atherosclerosis of native arteries of right leg with ulceration of other part of foot: Secondary | ICD-10-CM | POA: Diagnosis not present

## 2018-03-24 NOTE — Progress Notes (Signed)
Subjective:    Patient ID: Timothy Juarez, male    DOB: Feb 12, 1951, 67 y.o.   MRN: [id number] Chief Complaint  Patient presents with  . Follow-up    6 month ABI and Arterial    HPI   The patient returns to the office for followup and review of the noninvasive studies. There have been no interval changes in lower extremity symptoms. No interval shortening of the patient's claudication distance or development of rest pain symptoms. No new ulcers or wounds have occurred since the last visit.  There have been no significant changes to the patient's overall health care.  The patient denies amaurosis fugax or recent TIA symptoms. There are no recent neurological changes noted. The patient denies history of DVT, PE or superficial thrombophlebitis. The patient denies recent episodes of angina or shortness of breath.   ABI Rt=.50  (previous ABI's Rt=.62)  Duplex ultrasound of the right lower extremity.  Monophasic waveforms throughout.  Constitutional: [] Weight loss  [] Fever  [] Chills Cardiac: [] Chest pain   [] Chest pressure   [] Palpitations   [] Shortness of breath when laying flat   [] Shortness of breath with exertion. Vascular:  [] Pain in legs with walking   [] Pain in legs with standing  [] History of DVT   [] Phlebitis   [] Swelling in legs   [] Varicose veins   [] Non-healing ulcers  Pulmonary:   [] Uses home oxygen   [] Productive cough   [] Hemoptysis   [] Wheeze  [] COPD   [] Asthma Neurologic:  [] Dizziness   [] Seizures   [] History of stroke   [] History of TIA  [] Aphasia   [] Vissual changes   [] Weakness or numbness in arm   [] Weakness or numbness in leg Musculoskeletal:   [] Joint swelling   [] Joint pain   [] Low back pain Hematologic:  [] Easy bruising  [] Easy bleeding   [] Hypercoagulable state   [] Anemic Gastrointestinal:  [] Diarrhea   [] Vomiting  [] Gastroesophageal reflux/heartburn   [] Difficulty swallowing. Genitourinary:  [] Chronic kidney disease   [] Difficult urination  [] Frequent urination    [] Blood in urine Skin:  [] Rashes   [] Ulcers  Psychological:  [] History of anxiety   []  History of major depression.     Objective:   Physical Exam  BP 131/71 (BP Location: Left Arm, Patient Position: Sitting)   Pulse (!) 58   Resp 12   Ht 6\' 1"  (1.854 m)   Wt 212 lb (96.2 kg)   BMI 27.97 kg/m   Past Medical History:  Diagnosis Date  . Allergy   . Anemia   . ARF (acute respiratory failure) (HCC)    H/O  . CHF (congestive heart failure) ([place])   . COPD (chronic obstructive pulmonary disease) ([place])   . Hypertension   . Hypokalemia   . Insomnia   . Muscle contracture    MUSCLE SPASMS  . Peripheral vascular disease ([place])   . Pressure ulcer      Gen: WD/WN, NAD Head: [place]/AT, No temporalis wasting.  Ear/Nose/Throat: Hearing grossly intact, nares w/o erythema or drainage, poor dentition Eyes: PER, EOMI, sclera nonicteric.  Neck: Supple, no masses.  No bruit or JVD.  Pulmonary:  Good air movement, clear to auscultation bilaterally, no use of accessory muscles.  Cardiac: RRR, normal S1, S2, no Murmurs. Vascular:  Vessel Right Left  Radial    Brachial    Femoral    Popliteal    PT    DP Hard to palpate   Carotid    Gastrointestinal: soft, non-distended. No guarding/no peritoneal signs.  Musculoskeletal: M/S  5/5 throughout.  L BKA Neurologic: CN 2-12 intact. Pain and light touch intact in extremities.  Symmetrical.  Speech is fluent. Motor exam as listed above. Psychiatric: Judgment intact, Mood & affect appropriate for pt's clinical situation. Dermatologic: no Venous rashes no ulcers noted.  No changes consistent with cellulitis. Lymph : No Cervical lymphadenopathy, no lichenification or skin changes of chronic lymphedema.   Social History   Socioeconomic History  . Marital status: Legally Separated    Spouse name: Not on file  . Number of children: Not on file  . Years of education: Not on file  . Highest education level: Not on file  Occupational History  .  Not on file  Social Needs  . Financial resource strain: Not on file  . Food insecurity:    Worry: Not on file    Inability: Not on file  . Transportation needs:    Medical: Not on file    Non-medical: Not on file  Tobacco Use  . Smoking status: Former Smoker    Packs/day: 1.00    Years: 44.00    Pack years: 44.00    Types: Cigarettes    Last attempt to quit: 11/17/2012    Years since quitting: 5.3  . Smokeless tobacco: Never Used  Substance and Sexual Activity  . Alcohol use: No  . Drug use: No  . Sexual activity: Never  Lifestyle  . Physical activity:    Days per week: Not on file    Minutes per session: Not on file  . Stress: Not on file  Relationships  . Social connections:    Talks on phone: Not on file    Gets together: Not on file    Attends religious service: Not on file    Active member of club or organization: Not on file    Attends meetings of clubs or organizations: Not on file    Relationship status: Not on file  . Intimate partner violence:    Fear of current or ex partner: Not on file    Emotionally abused: Not on file    Physically abused: Not on file    Forced sexual activity: Not on file  Other Topics Concern  . Not on file  Social History Narrative  . Not on file    Past Surgical History:  Procedure Laterality Date  . AMPUTATION Left 09/19/2015   Procedure: AMPUTATION BELOW KNEE;  Surgeon: Algernon Huxley, MD;  Location: [facility] ORS;  Service: Vascular;  Laterality: Left;  . AMPUTATION Left 11/01/2015   Procedure: AMPUTATION ABOVE KNEE;  Surgeon: Algernon Huxley, MD;  Location: [facility] ORS;  Service: Vascular;  Laterality: Left;  . APPLICATION OF WOUND VAC Left 10/18/2015   Procedure: APPLICATION OF WOUND VAC;  Surgeon: Algernon Huxley, MD;  Location: [facility]S;  Service: Vascular;  Laterality: Left;  . COLONOSCOPY WITH PROPOFOL N/A 05/25/2017   Procedure: COLONOSCOPY WITH PROPOFOL;  Surgeon: Jonathon Bellows, MD;  Location: [facility] ENDOSCOPY;  Service: Gastroenterology;   Laterality: N/A;  . COLONOSCOPY WITH PROPOFOL N/A 10/14/2017   Procedure: COLONOSCOPY WITH PROPOFOL;  Surgeon: Jonathon Bellows, MD;  Location[facility];  Service: Gastroenterology;  Laterality: N/A;  . ESOPHAGOGASTRODUODENOSCOPY (EGD) WITH PROPOFOL N/A 05/25/2017   Procedure: ESOPHAGOGASTRODUODENOSCOPY (EGD) WITH PROPOFOL;  Surgeon: Jonathon Bellows, MD;  Location: [facility] ENDOSCOPY;  Service: Gastroenterology;  Laterality: N/A;  . ESOPHAGOGASTRODUODENOSCOPY (EGD) WITH PROPOFOL N/A 10/14/2017   Procedure: ESOPHAGOGASTRODUODENOSCOPY (EGD) WITH PROPOFOL;  Surgeon: Jonathon Bellows, MD;  Location: [facility] ENDOSCOPY;  Service: Gastroenterology;  Laterality: N/A;  . GIVENS CAPSULE STUDY N/A 07/14/2017   Procedure: GIVENS CAPSULE STUDY;  Surgeon: Jonathon Bellows, MD;  Location: [facility] ENDOSCOPY;  Service: Gastroenterology;  Laterality: N/A;  . GIVENS CAPSULE STUDY N/A 10/30/2017   Procedure: GIVENS CAPSULE STUDY 12 HR;  Surgeon: Jonathon Bellows, MD;  Location: [facility] ENDOSCOPY;  Service: Gastroenterology;  Laterality: N/A;  . IVC FILTER INSERTION N/A 10/12/2017   Procedure: IVC FILTER INSERTION;  Surgeon: Algernon Huxley, MD;  Location: [place] CV LAB;  Service: Cardiovascular;  Laterality: N/A;  . PERIPHERAL VASCULAR CATHETERIZATION Left 01/18/2015   Procedure: Lower Extremity Angiography;  Surgeon: Algernon Huxley, MD;  Location: [place] CV LAB;  Service: Cardiovascular;  Laterality: Left;  . PERIPHERAL VASCULAR CATHETERIZATION N/A 01/18/2015   Procedure: Lower Extremity Intervention;  Surgeon: Algernon Huxley, MD;  Location: [place] [facility] LAB;  Service: Cardiovascular;  Laterality: N/A;  . PERIPHERAL VASCULAR CATHETERIZATION  07/30/2015   Procedure: Lower Extremity Intervention;  Surgeon: Algernon Huxley, MD;  Location: [place] [facility] LAB;  Service: Cardiovascular;;  . PERIPHERAL VASCULAR CATHETERIZATION N/A 07/30/2015   Procedure: Abdominal Aortogram w/Lower Extremity;  Surgeon: Algernon Huxley, MD;  Location: [place] [facility] LAB;   Service: Cardiovascular;  Laterality: N/A;  . PERIPHERAL VASCULAR CATHETERIZATION Left 08/22/2015   Procedure: Lower Extremity Angiography;  Surgeon: Algernon Huxley, MD;  Location: [place] [facility] LAB;  Service: Cardiovascular;  Laterality: Left;  . PERIPHERAL VASCULAR CATHETERIZATION Left 08/22/2015   Procedure: Lower Extremity Intervention;  Surgeon: Algernon Huxley, MD;  Location: [place] CV LAB;  Service: Cardiovascular;  Laterality: Left;  . PERIPHERAL VASCULAR CATHETERIZATION Right 03/31/2016   Procedure: Lower Extremity Angiography;  Surgeon: Algernon Huxley, MD;  Location: [place] CV LAB;  Service: Cardiovascular;  Laterality: Right;  . PERIPHERAL VASCULAR CATHETERIZATION  03/31/2016   Procedure: Lower Extremity Intervention;  Surgeon: Algernon Huxley, MD;  Location: [place] CV LAB;  Service: Cardiovascular;;  . PERIPHERAL VASCULAR CATHETERIZATION Left 04/10/2016   Procedure: Lower Extremity Angiography;  Surgeon: Algernon Huxley, MD;  Location: [place] CV LAB;  Service: Cardiovascular;  Laterality: Left;  . WOUND DEBRIDEMENT Left 10/18/2015   Procedure: DEBRIDEMENT WOUND   ( LEFT BKA DEBRIDEMENT );  Surgeon: Algernon Huxley, MD;  Location: [facility] ORS;  Service: Vascular;  Laterality: Left;    History reviewed. No pertinent family history.  No Known Allergies     Assessment & Plan:   1. PAD (peripheral artery disease) (HCC)  Recommend:  The patient has evidence of atherosclerosis of the lower extremities with claudication.  The patient does not voice lifestyle limiting changes at this point in time.  Noninvasive studies do not suggest clinically significant change.  Patient does not wish for any intervention at this time.  Patient wishes to continue follow-up at regular intervals.  We will follow-up with patient in 6 months to reexamine status.  - VAS [place] ABI WITH/WO TBI; Future - VAS [place] LOWER EXTREMITY ARTERIAL DUPLEX; Future  2. Atherosclerosis of native arteries of extremities with  gangrene, left leg ([place]) See above  3. Atherosclerosis of native artery of right lower extremity with ulceration of other part of foot ([place]) No current ulcerations.  See above for current plan   Current Outpatient Medications on File Prior to Visit  Medication Sig Dispense Refill  . acetaminophen (TYLENOL) 325 MG tablet Take 650 mg by mouth at bedtime.     . ALPRAZolam (XANAX) 0.5 MG tablet     .  baclofen (LIORESAL) 10 MG tablet Take 10 mg by mouth 3 (three) times daily. Hold for sedation    . budesonide-formoterol (SYMBICORT) 80-4.5 MCG/ACT inhaler Inhale 2 puffs into the lungs 2 (two) times daily.    [name] docusate sodium (COLACE) 100 MG capsule Take 1 capsule (100 mg total) by mouth daily as needed for mild constipation. Do not take if you have loose stools 30 capsule 3  . fentaNYL (DURAGESIC - DOSED MCG/HR) 12 MCG/HR Place 1 patch (12.5 mcg total) onto the skin every 3 (three) days. 5 patch 0  . ferrous sulfate 325 (65 FE) MG EC tablet Take 1 tablet (325 mg total) by mouth 3 (three) times daily with meals. 90 tablet 3  . furosemide (LASIX) 40 MG tablet Take 1 tablet (40 mg total) by mouth daily. 30 tablet   . gabapentin (NEURONTIN) 100 MG capsule Take 100 mg by mouth at bedtime.    . INCRUSE ELLIPTA 62.5 MCG/INH AEPB     . ipratropium-albuterol (DUONEB) 0.5-2.5 (3) MG/3ML SOLN     . Melatonin 5 MG CAPS Take 1 capsule by mouth at bedtime as needed.    . nitroGLYCERIN (NITROSTAT) 0.4 MG SL tablet Place 0.14 mg under the tongue every 5 (five) minutes as needed for chest pain.    . Omega-3 Fatty Acids (FISH OIL) 1000 MG CAPS Take 1,000 mg by mouth every morning.    [name] oxyCODONE 10 MG TABS Take 1 tablet (10 mg total) by mouth 3 (three) times daily as needed (moderate pain). 15 tablet 0  . potassium chloride SA (K-DUR,KLOR-CON) 20 MEQ tablet     . ranitidine (ZANTAC) 150 MG capsule Take 150 mg by mouth every evening.    . ranitidine (ZANTAC) 150 MG tablet     . tiotropium (SPIRIVA) 18 MCG  inhalation capsule Place 18 mcg into inhaler and inhale daily.    . vitamin B-12 1000 MCG tablet Take 1 tablet (1,000 mcg total) by mouth daily. 30 tablet 0  . guaiFENesin (ROBITUSSIN) 100 MG/5ML SOLN Take 15 mLs by mouth 3 (three) times daily as needed for cough or to loosen phlegm.     No current facility-administered medications on file prior to visit.     There are no Patient Instructions on file for this visit. No follow-ups on file.   Kris Hartmann, NP

## 2018-04-05 ENCOUNTER — Inpatient Hospital Stay: Payer: Medicare Other | Attending: Oncology

## 2018-04-05 DIAGNOSIS — E538 Deficiency of other specified B group vitamins: Secondary | ICD-10-CM | POA: Insufficient documentation

## 2018-04-05 DIAGNOSIS — I82412 Acute embolism and thrombosis of left femoral vein: Secondary | ICD-10-CM | POA: Diagnosis not present

## 2018-04-05 DIAGNOSIS — I82409 Acute embolism and thrombosis of unspecified deep veins of unspecified lower extremity: Secondary | ICD-10-CM

## 2018-04-05 DIAGNOSIS — D500 Iron deficiency anemia secondary to blood loss (chronic): Secondary | ICD-10-CM

## 2018-04-05 DIAGNOSIS — D509 Iron deficiency anemia, unspecified: Secondary | ICD-10-CM | POA: Insufficient documentation

## 2018-04-05 LAB — CBC WITH DIFFERENTIAL/PLATELET
BASOS ABS: 0.1 10*3/uL (ref 0–0.1)
BASOS PCT: 1 %
Eosinophils Absolute: 0.2 10*3/uL (ref 0–0.7)
Eosinophils Relative: 2 %
HCT: 45.5 % (ref 40.0–52.0)
HEMOGLOBIN: 14.8 g/dL (ref 13.0–18.0)
Lymphocytes Relative: 21 %
Lymphs Abs: 2.2 10*3/uL (ref 1.0–3.6)
MCH: 25.3 pg — ABNORMAL LOW (ref 26.0–34.0)
MCHC: 32.5 g/dL (ref 32.0–36.0)
MCV: 77.9 fL — ABNORMAL LOW (ref 80.0–100.0)
MONO ABS: 0.9 10*3/uL (ref 0.2–1.0)
Monocytes Relative: 8 %
NEUTROS ABS: 7.4 10*3/uL — AB (ref 1.4–6.5)
NEUTROS PCT: 68 %
Platelets: 260 10*3/uL (ref 150–440)
RBC: 5.84 MIL/uL (ref 4.40–5.90)
RDW: 19.1 % — AB (ref 11.5–14.5)
WBC: 10.7 10*3/uL — AB (ref 3.8–10.6)

## 2018-04-05 LAB — IRON AND TIBC
Iron: 61 ug/dL (ref 45–182)
Saturation Ratios: 20 % (ref 17.9–39.5)
TIBC: 303 ug/dL (ref 250–450)
UIBC: 242 ug/dL

## 2018-04-05 LAB — FERRITIN: Ferritin: 66 ng/mL (ref 24–336)

## 2018-04-07 ENCOUNTER — Encounter: Payer: Self-pay | Admitting: Oncology

## 2018-04-07 ENCOUNTER — Inpatient Hospital Stay: Payer: Medicare Other

## 2018-04-07 ENCOUNTER — Other Ambulatory Visit: Payer: Self-pay

## 2018-04-07 ENCOUNTER — Inpatient Hospital Stay (HOSPITAL_BASED_OUTPATIENT_CLINIC_OR_DEPARTMENT_OTHER): Payer: Medicare Other | Admitting: Oncology

## 2018-04-07 VITALS — BP 145/84 | HR 64 | Temp 97.9°F | Resp 18 | Wt 212.0 lb

## 2018-04-07 DIAGNOSIS — D500 Iron deficiency anemia secondary to blood loss (chronic): Secondary | ICD-10-CM

## 2018-04-07 DIAGNOSIS — E538 Deficiency of other specified B group vitamins: Secondary | ICD-10-CM | POA: Diagnosis not present

## 2018-04-07 DIAGNOSIS — I82409 Acute embolism and thrombosis of unspecified deep veins of unspecified lower extremity: Secondary | ICD-10-CM

## 2018-04-07 DIAGNOSIS — D509 Iron deficiency anemia, unspecified: Secondary | ICD-10-CM | POA: Diagnosis not present

## 2018-04-07 DIAGNOSIS — I82412 Acute embolism and thrombosis of left femoral vein: Secondary | ICD-10-CM | POA: Diagnosis not present

## 2018-04-07 NOTE — Progress Notes (Signed)
[facility] Follow up Visit:  Patient Care Team: Alvester Morin, MD as PCP - General (Family Medicine)  REASON FOR VISIT Follow up for treatment of Recurrent DVT and recommendation of perioperative anticoagulation instructions.  PERTINENT HEMATOLOGY HISTORY [name] 67 y.o. male with PMH listed as below is here for evaluation of recurrent DVT and management. Patient is accompanied by RN from [facility] at [facility].  Patient is a poor historian. He remembers having leg clots for more than one time. He remembers taking Eliquis in the past and recently his anticoagulation has been switched to [place].  2 Medical records from nursing home by patient's PCP Dr.Slade-Hartman, patient had been on Eliquis 5mg  BID for previous DVTs. His medical records showed he had acute DVT at the left common femoral vein on 11/26/2016, and he was placed on Eliquis 2.5mg  BID. Later when another venous doppler was obtained on 02/15/2017 due to persistent pain and swelling, doppler showed persist deep vein thrombosis involving proxima to distal femoral vein. Common femoral vein has normal compression. There was a note on the second doppler report on 03/03/2017 that patient was taking Eliquis 5mg  BID, which was stopped on 7/19 and patient was placed on Lovenox. Patient had swelling and pain of his left stump and symptoms does not improve with being on Lovenox. Xarelto 15mg  BID was started on 03/05/2017 and Lovenox was discontinued on 03/09/2017 with a plan to switch to Xarelto 20mg  daily after 21 days.  It was mentioned in nursing home note that patient had been on Warfarin previously and acquired blood clot while on warfarin. Patient does not remember this and the details of his?warfarin resistance is unclear.  3 His Lupus anticoagulant testing was positive due to prolonged DRVVT, although possibly falsely positive due to [place]. He was offered to be switched to Lovenox shots  as alternative for treatment of possible lupus anticoagulant hypercoagulable state, patient prefers to stay on Xalreto 20mg  daily and has been doing well on that.  4 Iron deficiency anemia  Patient was admitted in Feb 2019 due to acute on chronic blood loss anemia, hemoglobin 5.3 on presentation.He was transfused with 2 units of PRBC and IV iron infusion..  Xarelto was discontinued due to GI bleeding.   IVC filter was placed on 2/25/ 2019 given patient's high risk of DVT recurrence.  He has had extensive GI work up including upper and lower endoscopy and capsule study, did not reveal active bleeding sites.   Plan to repeat EGD for gastric mapping and colonoscopy in 6-8 months due to poor prep in 09/2017   INTERVAL HISTORY Timothy Juarez is a 67 y.o. male who has above history reviewed by me today presents for follow up visit for iron deficiency anemia, and history of recurrent DVT.  He reports feeling well. Denies any lower extremity swelling or pain.  Denies any melena, blood in stool.    Review of Systems  Constitutional: Negative for appetite change, chills, diaphoresis, fatigue, fever and unexpected weight change.  HENT:   Negative for hearing loss, lump/mass, mouth sores, nosebleeds and sore throat.   Eyes: Negative for eye problems and icterus.  Respiratory: Negative for chest tightness, cough, hemoptysis, shortness of breath and wheezing.   Cardiovascular: Negative for chest pain and leg swelling.  Gastrointestinal: Negative for abdominal distention, abdominal pain, blood in stool, diarrhea, nausea and rectal pain.  Endocrine: Negative for hot flashes.  Genitourinary: Negative for bladder incontinence, difficulty urinating, dysuria, frequency, hematuria  and nocturia.   Musculoskeletal: Negative for back pain, flank pain, gait problem, myalgias and neck pain.       Left stump no swelling or pain except intermittent spasm.   Skin: Negative for itching and rash.  Neurological:  Negative.  Negative for dizziness, gait problem, headaches, numbness and seizures.  Hematological: Negative for adenopathy. Does not bruise/bleed easily.  Psychiatric/Behavioral: Negative for confusion and decreased concentration. The patient is not nervous/anxious.     MEDICAL HISTORY: Past Medical History:  Diagnosis Date  . Allergy   . Anemia   . ARF (acute respiratory failure) (HCC)    H/O  . CHF (congestive heart failure) ([place] [address])   . COPD (chronic obstructive pulmonary disease) ([place])   . Hypertension   . Hypokalemia   . Insomnia   . Muscle contracture    MUSCLE SPASMS  . Peripheral vascular disease ([place])   . Pressure ulcer     SURGICAL HISTORY: Past Surgical History:  Procedure Laterality Date  . AMPUTATION Left 09/19/2015   Procedure: AMPUTATION BELOW KNEE;  Surgeon: Algernon Huxley, MD;  Location: [facility] ORS;  Service: Vascular;  Laterality: Left;  . AMPUTATION Left 11/01/2015   Procedure: AMPUTATION ABOVE KNEE;  Surgeon: Algernon Huxley, MD;  Location: [facility] ORS;  Service: Vascular;  Laterality: Left;  . APPLICATION OF WOUND VAC Left 10/18/2015   Procedure: APPLICATION OF WOUND VAC;  Surgeon: Algernon Huxley, MD;  Location: [facility] ORS;  Service: Vascular;  Laterality: Left;  . COLONOSCOPY WITH PROPOFOL N/A 05/25/2017   Procedure: COLONOSCOPY WITH PROPOFOL;  Surgeon: Jonathon Bellows, MD;  Location: [facility] ENDOSCOPY;  Service: Gastroenterology;  Laterality: N/A;  . COLONOSCOPY WITH PROPOFOL N/A 10/14/2017   Procedure: COLONOSCOPY WITH PROPOFOL;  Surgeon: Jonathon Bellows, MD;  Location: [facility] ENDOSCOPY;  Service: Gastroenterology;  Laterality: N/A;  . ESOPHAGOGASTRODUODENOSCOPY (EGD) WITH PROPOFOL N/A 05/25/2017   Procedure: ESOPHAGOGASTRODUODENOSCOPY (EGD) WITH PROPOFOL;  Surgeon: Jonathon Bellows, MD;  Location: [facility] ENDOSCOPY;  Service: Gastroenterology;  Laterality: N/A;  . ESOPHAGOGASTRODUODENOSCOPY (EGD) WITH PROPOFOL N/A 10/14/2017   Procedure: ESOPHAGOGASTRODUODENOSCOPY (EGD) WITH PROPOFOL;  Surgeon: Jonathon Bellows, MD;  Location: [facility] ENDOSCOPY;  Service: Gastroenterology;  Laterality: N/A;  . GIVENS CAPSULE STUDY N/A 07/14/2017   Procedure: GIVENS CAPSULE STUDY;  Surgeon: Jonathon Bellows, MD;  Location: [facility] ENDOSCOPY;  Service: Gastroenterology;  Laterality: N/A;  . GIVENS CAPSULE STUDY N/A 10/30/2017   Procedure: GIVENS CAPSULE STUDY 12 HR;  Surgeon: Jonathon Bellows, MD;  Location: [facility] ENDOSCOPY;  Service: Gastroenterology;  Laterality: N/A;  . IVC FILTER INSERTION N/A 10/12/2017   Procedure: IVC FILTER INSERTION;  Surgeon: Algernon Huxley, MD;  Location: [place] CV LAB;  Service: Cardiovascular;  Laterality: N/A;  . PERIPHERAL VASCULAR CATHETERIZATION Left 01/18/2015   Procedure: Lower Extremity Angiography;  Surgeon: Algernon Huxley, MD;  Location: [place] CV LAB;  Service: Cardiovascular;  Laterality: Left;  . PERIPHERAL VASCULAR CATHETERIZATION N/A 01/18/2015   Procedure: Lower Extremity Intervention;  Surgeon: Algernon Huxley, MD;  Location: [place] CV LAB;  Service: Cardiovascular;  Laterality: N/A;  . PERIPHERAL VASCULAR CATHETERIZATION  07/30/2015   Procedure: Lower Extremity Intervention;  Surgeon: Algernon Huxley, MD;  Location: [place] CV LAB;  Service: Cardiovascular;;  . PERIPHERAL VASCULAR CATHETERIZATION N/A 07/30/2015   Procedure: Abdominal Aortogram w/Lower Extremity;  Surgeon: Algernon Huxley, MD;  Location: [place] CV LAB;  Service: Cardiovascular;  Laterality: N/A;  . PERIPHERAL VASCULAR CATHETERIZATION Left 08/22/2015   Procedure: Lower Extremity Angiography;  Surgeon: Algernon Huxley,  MD;  Location: [place] CV LAB;  Service: Cardiovascular;  Laterality: Left;  . PERIPHERAL VASCULAR CATHETERIZATION Left 08/22/2015   Procedure: Lower Extremity Intervention;  Surgeon: Algernon Huxley, MD;  Location: [place] CV LAB;  Service: Cardiovascular;  Laterality: Left;  . PERIPHERAL VASCULAR CATHETERIZATION Right 03/31/2016   Procedure: Lower Extremity Angiography;  Surgeon: Algernon Huxley, MD;   Location: [place] CV LAB;  Service: Cardiovascular;  Laterality: Right;  . PERIPHERAL VASCULAR CATHETERIZATION  03/31/2016   Procedure: Lower Extremity Intervention;  Surgeon: Algernon Huxley, MD;  Location: [place] CV LAB;  Service: Cardiovascular;;  . PERIPHERAL VASCULAR CATHETERIZATION Left 04/10/2016   Procedure: Lower Extremity Angiography;  Surgeon: Algernon Huxley, MD;  Location: [place] CV LAB;  Service: Cardiovascular;  Laterality: Left;  . WOUND DEBRIDEMENT Left 10/18/2015   Procedure: DEBRIDEMENT WOUND   ( LEFT BKA DEBRIDEMENT );  Surgeon: Algernon Huxley, MD;  Location: [facility] ORS;  Service: Vascular;  Laterality: Left;    SOCIAL HISTORY: Social History   Socioeconomic History  . Marital status: Legally Separated    Spouse name: Not on file  . Number of children: Not on file  . Years of education: Not on file  . Highest education level: Not on file  Occupational History  . Not on file  Social Needs  . Financial resource strain: Not on file  . Food insecurity:    Worry: Not on file    Inability: Not on file  . Transportation needs:    Medical: Not on file    Non-medical: Not on file  Tobacco Use  . Smoking status: Former Smoker    Packs/day: 1.00    Years: 44.00    Pack years: 44.00    Types: Cigarettes    Last attempt to quit: 11/17/2012    Years since quitting: 5.3  . Smokeless tobacco: Never Used  Substance and Sexual Activity  . Alcohol use: No  . Drug use: No  . Sexual activity: Never  Lifestyle  . Physical activity:    Days per week: Not on file    Minutes per session: Not on file  . Stress: Not on file  Relationships  . Social connections:    Talks on phone: Not on file    Gets together: Not on file    Attends religious service: Not on file    Active member of club or organization: Not on file    Attends meetings of clubs or organizations: Not on file    Relationship status: Not on file  . Intimate partner violence:    Fear of current or ex  partner: Not on file    Emotionally abused: Not on file    Physically abused: Not on file    Forced sexual activity: Not on file  Other Topics Concern  . Not on file  Social History Narrative  . Not on file    FAMILY HISTORY: Mother died from an MI. Father died from a brain tumor  ALLERGIES:  has No Known Allergies.  MEDICATIONS:  Current Outpatient Medications  Medication Sig Dispense Refill  . acetaminophen (TYLENOL) 325 MG tablet Take 650 mg by mouth at bedtime.     . baclofen (LIORESAL) 10 MG tablet Take 10 mg by mouth 3 (three) times daily. Hold for sedation    . budesonide-formoterol (SYMBICORT) 80-4.5 MCG/ACT inhaler Inhale 2 puffs into the lungs 2 (two) times daily.    [name] docusate sodium (COLACE) 100 MG capsule Take 1  capsule (100 mg total) by mouth daily as needed for mild constipation. Do not take if you have loose stools 30 capsule 3  . fentaNYL (DURAGESIC - DOSED MCG/HR) 12 MCG/HR Place 1 patch (12.5 mcg total) onto the skin every 3 (three) days. 5 patch 0  . ferrous sulfate 325 (65 FE) MG EC tablet Take 1 tablet (325 mg total) by mouth 3 (three) times daily with meals. 90 tablet 3  . furosemide (LASIX) 40 MG tablet Take 1 tablet (40 mg total) by mouth daily. 30 tablet   . gabapentin (NEURONTIN) 100 MG capsule Take 100 mg by mouth at bedtime.    [name] guaiFENesin (ROBITUSSIN) 100 MG/5ML SOLN Take 15 mLs by mouth 3 (three) times daily as needed for cough or to loosen phlegm.    . INCRUSE ELLIPTA 62.5 MCG/INH AEPB     . Melatonin 5 MG CAPS Take 1 capsule by mouth at bedtime as needed.    . nitroGLYCERIN (NITROSTAT) 0.4 MG SL tablet Place 0.14 mg under the tongue every 5 (five) minutes as needed for chest pain.    . Omega-3 Fatty Acids (FISH OIL) 1000 MG CAPS Take 1,000 mg by mouth every morning.    [name] oxyCODONE 10 MG TABS Take 1 tablet (10 mg total) by mouth 3 (three) times daily as needed (moderate pain). 15 tablet 0  . potassium chloride SA (K-DUR,KLOR-CON) 20 MEQ tablet     .  ranitidine (ZANTAC) 150 MG capsule Take 150 mg by mouth every evening.    [name] ALPRAZolam (XANAX) 0.5 MG tablet     . ipratropium-albuterol (DUONEB) 0.5-2.5 (3) MG/3ML SOLN     . tiotropium (SPIRIVA) 18 MCG inhalation capsule Place 18 mcg into inhaler and inhale daily.    . vitamin B-12 1000 MCG tablet Take 1 tablet (1,000 mcg total) by mouth daily. (Patient not taking: Reported on 04/07/2018) 30 tablet 0   No current facility-administered medications for this visit.     PHYSICAL EXAMINATION:  ECOG PERFORMANCE STATUS: 1 - Symptomatic but completely ambulatory  Vitals:   04/07/18 1332  BP: (!) 145/84  Pulse: 64  Resp: 18  Temp: 97.9 F (36.6 C)    Filed Weights   04/07/18 1332  Weight: 212 lb (96.2 kg)   Physical Exam  Constitutional: He is oriented to person, place, and time and well-developed, well-nourished, and in no distress. No distress.  HENT:  Head: Normocephalic and atraumatic.  Nose: Nose normal.  Mouth/Throat: Oropharynx is clear and moist. No oropharyngeal exudate.  Eyes: Pupils are equal, round, and reactive to light. EOM are normal. Left eye exhibits no discharge. No scleral icterus.  Neck: Normal range of motion. Neck supple. No JVD present.  Cardiovascular: Normal rate, regular rhythm and normal heart sounds.  No murmur heard. Pulmonary/Chest: Effort normal and breath sounds normal. No respiratory distress. He has no wheezes. He has no rales. He exhibits no tenderness.  Abdominal: Soft. Bowel sounds are normal. He exhibits no distension and no mass. There is no tenderness. There is no rebound.  Musculoskeletal: Normal range of motion. He exhibits no edema or tenderness.  left above knee amputation stump non tender or swelling. No edema at right lower extremity  Lymphadenopathy:    He has no cervical adenopathy.  Neurological: He is alert and oriented to person, place, and time. No cranial nerve deficit. He exhibits normal muscle tone. Coordination normal.  Skin:  Skin is warm and dry. No rash noted. He is not diaphoretic. No erythema.  Psychiatric: Judgment normal. He has a flat affect.      LABORATORY DATA: I have personally reviewed the data as listed:  Appointment on 04/05/2018  Component Date Value Ref Range Status  . Ferritin 04/05/2018 66  24 - 336 ng/mL Final   Performed at [facility], [address] [place], [place] [address]  . Iron 04/05/2018 61  45 - 182 ug/dL Final  . TIBC 04/05/2018 303  250 - 450 ug/dL Final  . Saturation Ratios 04/05/2018 20  17.9 - 39.5 % Final  . UIBC 04/05/2018 242  ug/dL Final   Performed at [facility], [address] [place], [place] [address]  . WBC 04/05/2018 10.7* 3.8 - 10.6 K/uL Final  . RBC 04/05/2018 5.84  4.40 - 5.90 MIL/uL Final  . Hemoglobin 04/05/2018 14.8  13.0 - 18.0 g/dL Final  . HCT 04/05/2018 45.5  40.0 - 52.0 % Final  . MCV 04/05/2018 77.9* 80.0 - 100.0 fL Final  . MCH 04/05/2018 25.3* 26.0 - 34.0 pg Final  . MCHC 04/05/2018 32.5  32.0 - 36.0 g/dL Final  . RDW 04/05/2018 19.1* 11.5 - 14.5 % Final  . Platelets 04/05/2018 260  150 - 440 K/uL Final  . Neutrophils Relative % 04/05/2018 68  % Final  . Neutro Abs 04/05/2018 7.4* 1.4 - 6.5 K/uL Final  . Lymphocytes Relative 04/05/2018 21  % Final  . Lymphs Abs 04/05/2018 2.2  1.0 - 3.6 K/uL Final  . Monocytes Relative 04/05/2018 8  % Final  . Monocytes Absolute 04/05/2018 0.9  0.2 - 1.0 K/uL Final  . Eosinophils Relative 04/05/2018 2  % Final  . Eosinophils Absolute 04/05/2018 0.2  0 - 0.7 K/uL Final  . Basophils Relative 04/05/2018 1  % Final  . Basophils Absolute 04/05/2018 0.1  0 - 0.1 K/uL Final   Performed at [facility], [address] [place],  [address]    RADIOGRAPHIC STUDIES: I have personally reviewed the radiological images as listed and agree with the findings in the report 04/03/2017 US venous doppler IMPRESSION: Left inguinal/femoral partially thrombosed pseudoaneurysm/hematoma  measuring 15 x 5.3 x 6.8 cm.  Reviewed image records from [facility] home 11/26/2016 patient had a venous Doppler extremity left done which showed no occluding acute DVT at the left common femoral vein. 03/03/2017 patient had another venous Doppler extremity left which showed deep vein thromboses involving the proximal to distal femoral vein the common femoral artery now has normal compression.  04/05/2013 CT chest with contrast    Chronic pulmonary disease with areas of atelectasis and bronchiectasis. Continued significant opacification and bullous change in the left lung especially in the upper lobe and superior segment of the  left lower lobe with other patchy areas bilaterally. There may be mild interval improvement in the area of the lower lingula. Malignancy cannot be excluded in the abnormal areas of the lungs. No central pulmonary arterial filling defect is seen. The bolus is not optimal for evaluation of the lungs  otherwise.    ASSESSMENT/PLAN 1. Iron deficiency anemia due to chronic blood loss   2. Recurrent deep vein thrombosis (DVT) (HCC)   3. B12 deficiency   . # labs are reviewed. Stable hemoglobin, improved iron store. Recommend patient to continue oral ferrous sulfate 325mg  BID.   # History of recurrent DVT Off anticoagulation due to previous GI bleeding. S/p IVC filter.  Patient suppose to follow up with GI for repeat endoscopy due to previous poor  prep.  He declines.  Clinically stable, no signs of bleeding. Iron panel improves, stable hemoglobin.  Recommend patient to resume Aspirin 81mg  daily.  Continue hold anticoagulation.  Previously ambiguous anti-phospholipid syndrome panel [ clarify his previously prolonged DVRRT. ] due to being on DOACs.  Will repeat panel while he is off.   # s/p parental B12 IM injections. B12 level improved, last level 1314.  Total face to face encounter time for this patient visit was 15 min. >50% of the time was   spent in counseling and coordination of care.    Follow-up in 3 months.  Earlie Server, MD  04/07/2018 9:08 PM

## 2018-04-12 LAB — ANTIPHOSPHOLIPID SYNDROME PROF
Anticardiolipin IgG: <9 GPL U/mL (ref 0–14)
Anticardiolipin IgM: <9 MPL U/mL (ref 0–12)
DRVVT: 33 s (ref 0.0–47.0)
PTT Lupus Anticoagulant: 34.7 s (ref 0.0–51.9)

## 2018-04-13 DIAGNOSIS — D649 Anemia, unspecified: Secondary | ICD-10-CM | POA: Diagnosis not present

## 2018-04-28 ENCOUNTER — Ambulatory Visit: Admit: 2018-04-28 | Payer: Medicare Other | Admitting: Gastroenterology

## 2018-04-28 SURGERY — COLONOSCOPY WITH PROPOFOL
Anesthesia: General

## 2018-05-13 DIAGNOSIS — K219 Gastro-esophageal reflux disease without esophagitis: Secondary | ICD-10-CM | POA: Diagnosis not present

## 2018-05-13 DIAGNOSIS — D508 Other iron deficiency anemias: Secondary | ICD-10-CM | POA: Diagnosis not present

## 2018-05-13 DIAGNOSIS — I5189 Other ill-defined heart diseases: Secondary | ICD-10-CM | POA: Diagnosis not present

## 2018-05-13 DIAGNOSIS — I7389 Other specified peripheral vascular diseases: Secondary | ICD-10-CM | POA: Diagnosis not present

## 2018-05-14 DIAGNOSIS — D649 Anemia, unspecified: Secondary | ICD-10-CM | POA: Diagnosis not present

## 2018-05-14 DIAGNOSIS — Z79899 Other long term (current) drug therapy: Secondary | ICD-10-CM | POA: Diagnosis not present

## 2018-05-27 DIAGNOSIS — D519 Vitamin B12 deficiency anemia, unspecified: Secondary | ICD-10-CM | POA: Diagnosis not present

## 2018-05-27 DIAGNOSIS — D649 Anemia, unspecified: Secondary | ICD-10-CM | POA: Diagnosis not present

## 2018-06-07 DIAGNOSIS — Z23 Encounter for immunization: Secondary | ICD-10-CM | POA: Diagnosis not present

## 2018-06-15 DIAGNOSIS — R600 Localized edema: Secondary | ICD-10-CM | POA: Diagnosis not present

## 2018-06-15 DIAGNOSIS — I82402 Acute embolism and thrombosis of unspecified deep veins of left lower extremity: Secondary | ICD-10-CM | POA: Diagnosis not present

## 2018-06-15 DIAGNOSIS — I82412 Acute embolism and thrombosis of left femoral vein: Secondary | ICD-10-CM | POA: Diagnosis not present

## 2018-06-15 DIAGNOSIS — Z9229 Personal history of other drug therapy: Secondary | ICD-10-CM | POA: Diagnosis not present

## 2018-06-16 ENCOUNTER — Encounter (INDEPENDENT_AMBULATORY_CARE_PROVIDER_SITE_OTHER): Payer: Medicare Other

## 2018-06-16 ENCOUNTER — Ambulatory Visit (INDEPENDENT_AMBULATORY_CARE_PROVIDER_SITE_OTHER): Payer: Medicare Other | Admitting: Vascular Surgery

## 2018-06-17 DIAGNOSIS — I82512 Chronic embolism and thrombosis of left femoral vein: Secondary | ICD-10-CM | POA: Diagnosis not present

## 2018-06-18 DIAGNOSIS — Z79899 Other long term (current) drug therapy: Secondary | ICD-10-CM | POA: Diagnosis not present

## 2018-06-18 DIAGNOSIS — D649 Anemia, unspecified: Secondary | ICD-10-CM | POA: Diagnosis not present

## 2018-06-28 ENCOUNTER — Other Ambulatory Visit: Payer: Self-pay

## 2018-06-28 DIAGNOSIS — D500 Iron deficiency anemia secondary to blood loss (chronic): Secondary | ICD-10-CM

## 2018-07-05 ENCOUNTER — Other Ambulatory Visit: Payer: Self-pay

## 2018-07-05 ENCOUNTER — Inpatient Hospital Stay: Payer: Medicare Other | Attending: Oncology

## 2018-07-05 DIAGNOSIS — D500 Iron deficiency anemia secondary to blood loss (chronic): Secondary | ICD-10-CM

## 2018-07-05 DIAGNOSIS — Z86718 Personal history of other venous thrombosis and embolism: Secondary | ICD-10-CM | POA: Insufficient documentation

## 2018-07-05 DIAGNOSIS — D509 Iron deficiency anemia, unspecified: Secondary | ICD-10-CM | POA: Insufficient documentation

## 2018-07-05 DIAGNOSIS — Z7901 Long term (current) use of anticoagulants: Secondary | ICD-10-CM | POA: Insufficient documentation

## 2018-07-05 LAB — CBC WITH DIFFERENTIAL/PLATELET
ABS IMMATURE GRANULOCYTES: 0.05 10*3/uL (ref 0.00–0.07)
BASOS ABS: 0.1 10*3/uL (ref 0.0–0.1)
BASOS PCT: 1 %
EOS ABS: 0.2 10*3/uL (ref 0.0–0.5)
EOS PCT: 2 %
HCT: 47.4 % (ref 39.0–52.0)
HEMOGLOBIN: 14.9 g/dL (ref 13.0–17.0)
Immature Granulocytes: 1 %
LYMPHS ABS: 2.2 10*3/uL (ref 0.7–4.0)
Lymphocytes Relative: 24 %
MCH: 26 pg (ref 26.0–34.0)
MCHC: 31.4 g/dL (ref 30.0–36.0)
MCV: 82.7 fL (ref 80.0–100.0)
MONOS PCT: 8 %
Monocytes Absolute: 0.8 10*3/uL (ref 0.1–1.0)
NEUTROS PCT: 64 %
NRBC: 0 % (ref 0.0–0.2)
Neutro Abs: 6.1 10*3/uL (ref 1.7–7.7)
Platelets: 247 10*3/uL (ref 150–400)
RBC: 5.73 MIL/uL (ref 4.22–5.81)
RDW: 15.3 % (ref 11.5–15.5)
WBC: 9.4 10*3/uL (ref 4.0–10.5)

## 2018-07-05 LAB — FERRITIN: FERRITIN: 88 ng/mL (ref 24–336)

## 2018-07-05 LAB — IRON AND TIBC
Iron: 56 ug/dL (ref 45–182)
SATURATION RATIOS: 20 % (ref 17.9–39.5)
TIBC: 285 ug/dL (ref 250–450)
UIBC: 229 ug/dL

## 2018-07-07 ENCOUNTER — Other Ambulatory Visit: Payer: Self-pay

## 2018-07-07 ENCOUNTER — Encounter: Payer: Self-pay | Admitting: Oncology

## 2018-07-07 ENCOUNTER — Inpatient Hospital Stay (HOSPITAL_BASED_OUTPATIENT_CLINIC_OR_DEPARTMENT_OTHER): Payer: Medicare Other | Admitting: Oncology

## 2018-07-07 VITALS — BP 131/70 | HR 66 | Temp 97.2°F | Resp 18 | Wt 217.0 lb

## 2018-07-07 DIAGNOSIS — Z5181 Encounter for therapeutic drug level monitoring: Secondary | ICD-10-CM

## 2018-07-07 DIAGNOSIS — D509 Iron deficiency anemia, unspecified: Secondary | ICD-10-CM

## 2018-07-07 DIAGNOSIS — D500 Iron deficiency anemia secondary to blood loss (chronic): Secondary | ICD-10-CM

## 2018-07-07 DIAGNOSIS — Z95828 Presence of other vascular implants and grafts: Secondary | ICD-10-CM

## 2018-07-07 DIAGNOSIS — Z7901 Long term (current) use of anticoagulants: Secondary | ICD-10-CM | POA: Diagnosis not present

## 2018-07-07 DIAGNOSIS — Z86718 Personal history of other venous thrombosis and embolism: Secondary | ICD-10-CM

## 2018-07-07 DIAGNOSIS — I82409 Acute embolism and thrombosis of unspecified deep veins of unspecified lower extremity: Secondary | ICD-10-CM

## 2018-07-07 NOTE — Progress Notes (Signed)
Patient here for follow up. Lives at [facility].

## 2018-07-09 NOTE — Progress Notes (Signed)
[facility] Follow up Visit:  Patient Care Team: Alvester Morin, MD as PCP - General (Family Medicine)  REASON FOR VISIT Follow up for treatment of Recurrent DVT, anticoagulation, iron deficiency anemia  PERTINENT HEMATOLOGY HISTORY 1Robert L Keener 67 y.o. male with PMH listed as below is here for evaluation of recurrent DVT and management. Patient is accompanied by RN from [facility].  Patient is a poor historian. He remembers having leg clots for more than one time. He remembers taking Eliquis in the past and recently his anticoagulation has been switched to [place].  2 Medical records from nursing home by patient's PCP Dr.Slade-Hartman, patient had been on Eliquis 5mg  BID for previous DVTs. His medical records showed he had acute DVT at the left common femoral vein on 11/26/2016, and he was placed on Eliquis 2.5mg  BID. Later when another venous doppler was obtained on 02/15/2017 due to persistent pain and swelling, doppler showed persist deep vein thrombosis involving proxima to distal femoral vein. Common femoral vein has normal compression. There was a note on the second doppler report on 03/03/2017 that patient was taking Eliquis 5mg  BID, which was stopped on 7/19 and patient was placed on Lovenox. Patient had swelling and pain of his left stump and symptoms does not improve with being on Lovenox. Xarelto 15mg  BID was started on 03/05/2017 and Lovenox was discontinued on 03/09/2017 with a plan to switch to Xarelto 20mg  daily after 21 days.  It was mentioned in nursing home note that patient had been on Warfarin previously and acquired blood clot while on warfarin. Patient does not remember this and the details of his?warfarin resistance is unclear.  3 His Lupus anticoagulant testing was positive due to prolonged DRVVT, although possibly falsely positive due to [place]. He was offered to be switched to Lovenox shots as alternative for  treatment of possible lupus anticoagulant hypercoagulable state, patient prefers to stay on Xalreto 20mg  daily and has been doing well on that.  4 Iron deficiency anemia  Patient was admitted in Feb 2019 due to acute on chronic blood loss anemia, hemoglobin 5.3 on presentation.He was transfused with 2 units of PRBC and IV iron infusion..  Xarelto was discontinued due to GI bleeding.   IVC filter was placed on 2/25/ 2019 given patient's high risk of DVT recurrence.  He has had extensive GI work up including upper and lower endoscopy and capsule study, did not reveal active bleeding sites.   Plan to repeat EGD for gastric mapping and colonoscopy in 6-8 months due to poor prep in 09/2017   INTERVAL HISTORY RANJIT ASHURST is a 67 y.o. male who has above history reviewed by me today presents for follow up visit for iron deficiency anemia, and history of recurrent DVT. Patient is a poor historian. Reviewed patient's medication, eliquis 5mg  BID was added. Patient reports that a few weeks ago, his left lower extremity stump was swelling again and [place] at nursing home facility showed recurrent DVT again and resumed anticoagulation with Eliquis.  Denies hematochezia, hematuria, hematemesis, epistaxis, black tarry stool or easy bruising.  He has no new complaints.   Review of Systems  Constitutional: Negative for appetite change, chills, fatigue and fever.  HENT:   Negative for hearing loss and voice change.   Eyes: Negative for eye problems and icterus.  Respiratory: Negative for chest tightness, cough and shortness of breath.   Cardiovascular: Negative for chest pain and leg swelling.  Gastrointestinal: Negative for  abdominal distention and abdominal pain.  Endocrine: Negative for hot flashes.  Genitourinary: Negative for difficulty urinating, dysuria and frequency.   Musculoskeletal: Negative for arthralgias.       Left lower extremity stump swelling improved.  Skin: Negative for itching and rash.   Neurological: Negative for light-headedness and numbness.  Hematological: Negative for adenopathy. Does not bruise/bleed easily.  Psychiatric/Behavioral: Negative for confusion.    MEDICAL HISTORY: Past Medical History:  Diagnosis Date  . Allergy   . Anemia   . ARF (acute respiratory failure) (HCC)    H/O  . CHF (congestive heart failure) ([place])   . COPD (chronic obstructive pulmonary disease) ([place])   . Hypertension   . Hypokalemia   . Insomnia   . Muscle contracture    MUSCLE SPASMS  . Peripheral vascular disease ([place])   . Pressure ulcer     SURGICAL HISTORY: Past Surgical History:  Procedure Laterality Date  . AMPUTATION Left 09/19/2015   Procedure: AMPUTATION BELOW KNEE;  Surgeon: Algernon Huxley, MD;  Location: [facility] ORS;  Service: Vascular;  Laterality: Left;  . AMPUTATION Left 11/01/2015   Procedure: AMPUTATION ABOVE KNEE;  Surgeon: Algernon Huxley, MD;  Location: [facility] ORS;  Service: Vascular;  Laterality: Left;  . APPLICATION OF WOUND VAC Left 10/18/2015   Procedure: APPLICATION OF WOUND VAC;  Surgeon: Algernon Huxley, MD;  Location: [facility] ORS;  Service: Vascular;  Laterality: Left;  . COLONOSCOPY WITH PROPOFOL N/A 05/25/2017   Procedure: COLONOSCOPY WITH PROPOFOL;  Surgeon: Jonathon Bellows, MD;  Location: [facility] ENDOSCOPY;  Service: Gastroenterology;  Laterality: N/A;  . COLONOSCOPY WITH PROPOFOL N/A 10/14/2017   Procedure: COLONOSCOPY WITH PROPOFOL;  Surgeon: Jonathon Bellows, MD;  Location: [facility] ENDOSCOPY;  Service: Gastroenterology;  Laterality: N/A;  . ESOPHAGOGASTRODUODENOSCOPY (EGD) WITH PROPOFOL N/A 05/25/2017   Procedure: ESOPHAGOGASTRODUODENOSCOPY (EGD) WITH PROPOFOL;  Surgeon: Jonathon Bellows, MD;  Location: [facility] ENDOSCOPY;  Service: Gastroenterology;  Laterality: N/A;  . ESOPHAGOGASTRODUODENOSCOPY (EGD) WITH PROPOFOL N/A 10/14/2017   Procedure: ESOPHAGOGASTRODUODENOSCOPY (EGD) WITH PROPOFOL;  Surgeon: Jonathon Bellows, MD;  Location: [facility] ENDOSCOPY;  Service: Gastroenterology;  Laterality: N/A;  .  GIVENS CAPSULE STUDY N/A 07/14/2017   Procedure: GIVENS CAPSULE STUDY;  Surgeon: Jonathon Bellows, MD;  Location: [facility] ENDOSCOPY;  Service: Gastroenterology;  Laterality: N/A;  . GIVENS CAPSULE STUDY N/A 10/30/2017   Procedure: GIVENS CAPSULE STUDY 12 HR;  Surgeon: Jonathon Bellows, MD;  Location: [facility] ENDOSCOPY;  Service: Gastroenterology;  Laterality: N/A;  . IVC FILTER INSERTION N/A 10/12/2017   Procedure: IVC FILTER INSERTION;  Surgeon: Algernon Huxley, MD;  Location: [place] CV LAB;  Service: Cardiovascular;  Laterality: N/A;  . PERIPHERAL VASCULAR CATHETERIZATION Left 01/18/2015   Procedure: Lower Extremity Angiography;  Surgeon: Algernon Huxley, MD;  Location: [place] CV LAB;  Service: Cardiovascular;  Laterality: Left;  . PERIPHERAL VASCULAR CATHETERIZATION N/A 01/18/2015   Procedure: Lower Extremity Intervention;  Surgeon: Algernon Huxley, MD;  Location: [place] CV LAB;  Service: Cardiovascular;  Laterality: N/A;  . PERIPHERAL VASCULAR CATHETERIZATION  07/30/2015   Procedure: Lower Extremity Intervention;  Surgeon: Algernon Huxley, MD;  Location: [place] CV LAB;  Service: Cardiovascular;;  . PERIPHERAL VASCULAR CATHETERIZATION N/A 07/30/2015   Procedure: Abdominal Aortogram w/Lower Extremity;  Surgeon: Algernon Huxley, MD;  Location: [place] CV LAB;  Service: Cardiovascular;  Laterality: N/A;  . PERIPHERAL VASCULAR CATHETERIZATION Left 08/22/2015   Procedure: Lower Extremity Angiography;  Surgeon: Algernon Huxley, MD;  Location: [place] CV LAB;  Service: Cardiovascular;  Laterality: Left;  . PERIPHERAL VASCULAR CATHETERIZATION Left 08/22/2015   Procedure: Lower Extremity Intervention;  Surgeon: Algernon Huxley, MD;  Location: [place] CV LAB;  Service: Cardiovascular;  Laterality: Left;  . PERIPHERAL VASCULAR CATHETERIZATION Right 03/31/2016   Procedure: Lower Extremity Angiography;  Surgeon: Algernon Huxley, MD;  Location: [place] CV LAB;  Service: Cardiovascular;  Laterality: Right;  .  PERIPHERAL VASCULAR CATHETERIZATION  03/31/2016   Procedure: Lower Extremity Intervention;  Surgeon: Algernon Huxley, MD;  Location: [place] CV LAB;  Service: Cardiovascular;;  . PERIPHERAL VASCULAR CATHETERIZATION Left 04/10/2016   Procedure: Lower Extremity Angiography;  Surgeon: Algernon Huxley, MD;  Location: [place] CV LAB;  Service: Cardiovascular;  Laterality: Left;  . WOUND DEBRIDEMENT Left 10/18/2015   Procedure: DEBRIDEMENT WOUND   ( LEFT BKA DEBRIDEMENT );  Surgeon: Algernon Huxley, MD;  Location: [facility] ORS;  Service: Vascular;  Laterality: Left;    SOCIAL HISTORY: Social History   Socioeconomic History  . Marital status: Legally Separated    Spouse name: Not on file  . Number of children: Not on file  . Years of education: Not on file  . Highest education level: Not on file  Occupational History  . Not on file  Social Needs  . Financial resource strain: Not on file  . Food insecurity:    Worry: Not on file    Inability: Not on file  . Transportation needs:    Medical: Not on file    Non-medical: Not on file  Tobacco Use  . Smoking status: Former Smoker    Packs/day: 1.00    Years: 44.00    Pack years: 44.00    Types: Cigarettes    Last attempt to quit: 11/17/2012    Years since quitting: 5.6  . Smokeless tobacco: Never Used  Substance and Sexual Activity  . Alcohol use: No  . Drug use: No  . Sexual activity: Never  Lifestyle  . Physical activity:    Days per week: Not on file    Minutes per session: Not on file  . Stress: Not on file  Relationships  . Social connections:    Talks on phone: Not on file    Gets together: Not on file    Attends religious service: Not on file    Active member of club or organization: Not on file    Attends meetings of clubs or organizations: Not on file    Relationship status: Not on file  . Intimate partner violence:    Fear of current or ex partner: Not on file    Emotionally abused: Not on file    Physically abused: Not on  file    Forced sexual activity: Not on file  Other Topics Concern  . Not on file  Social History Narrative  . Not on file    FAMILY HISTORY: Mother died from an MI. Father died from a brain tumor  ALLERGIES:  has No Known Allergies.  MEDICATIONS:  Current Outpatient Medications  Medication Sig Dispense Refill  . acetaminophen (TYLENOL) 325 MG tablet Take 650 mg by mouth at bedtime.     . baclofen (LIORESAL) 10 MG tablet Take 10 mg by mouth 3 (three) times daily. Hold for sedation    . budesonide-formoterol (SYMBICORT) 80-4.5 MCG/ACT inhaler Inhale 2 puffs into the lungs 2 (two) times daily.    [name] docusate sodium (COLACE) 100 MG capsule Take 1 capsule (100 mg total) by mouth daily as needed for mild  constipation. Do not take if you have loose stools 30 capsule 3  . ELIQUIS 5 MG TABS tablet Take 5 mg by mouth 2 (two) times daily.    . fentaNYL (DURAGESIC - DOSED MCG/HR) 12 MCG/HR Place 1 patch (12.5 mcg total) onto the skin every 3 (three) days. 5 patch 0  . ferrous sulfate 325 (65 FE) MG EC tablet Take 1 tablet (325 mg total) by mouth 3 (three) times daily with meals. (Patient taking differently: Take 325 mg by mouth daily with breakfast. ) 90 tablet 3  . furosemide (LASIX) 40 MG tablet Take 1 tablet (40 mg total) by mouth daily. 30 tablet   . gabapentin (NEURONTIN) 100 MG capsule Take 100 mg by mouth at bedtime.    [name] guaiFENesin (ROBITUSSIN) 100 MG/5ML SOLN Take 15 mLs by mouth 3 (three) times daily as needed for cough or to loosen phlegm.    . INCRUSE ELLIPTA 62.5 MCG/INH AEPB     . Melatonin 5 MG CAPS Take 1 capsule by mouth at bedtime as needed.    . nitroGLYCERIN (NITROSTAT) 0.4 MG SL tablet Place 0.14 mg under the tongue every 5 (five) minutes as needed for chest pain.    . Omega-3 Fatty Acids (FISH OIL) 1000 MG CAPS Take 1,000 mg by mouth every morning.    [name] oxyCODONE 10 MG TABS Take 1 tablet (10 mg total) by mouth 3 (three) times daily as needed (moderate pain). 15 tablet 0  .  potassium chloride SA (K-DUR,KLOR-CON) 20 MEQ tablet     . ALPRAZolam (XANAX) 0.5 MG tablet     . ipratropium-albuterol (DUONEB) 0.5-2.5 (3) MG/3ML SOLN     . ranitidine (ZANTAC) 150 MG capsule Take 150 mg by mouth every evening.    . tiotropium (SPIRIVA) 18 MCG inhalation capsule Place 18 mcg into inhaler and inhale daily.    . vitamin B-12 1000 MCG tablet Take 1 tablet (1,000 mcg total) by mouth daily. (Patient not taking: Reported on 04/07/2018) 30 tablet 0   No current facility-administered medications for this visit.     PHYSICAL EXAMINATION:  ECOG PERFORMANCE STATUS: 1 - Symptomatic but completely ambulatory  Vitals:   07/07/18 1408  BP: 131/70  Pulse: 66  Resp: 18  Temp: (!) 97.2 F (36.2 C)    Filed Weights   07/07/18 1408  Weight: 217 lb (98.4 kg)   Physical Exam  Constitutional: He is oriented to person, place, and time and well-developed, well-nourished, and in no distress. No distress.  HENT:  Head: Normocephalic and atraumatic.  Nose: Nose normal.  Mouth/Throat: Oropharynx is clear and moist. No oropharyngeal exudate.  Eyes: Pupils are equal, round, and reactive to light. EOM are normal. Left eye exhibits no discharge. No scleral icterus.  Neck: Normal range of motion. Neck supple. No JVD present.  Cardiovascular: Normal rate, regular rhythm and normal heart sounds.  No murmur heard. Pulmonary/Chest: Effort normal and breath sounds normal. No respiratory distress. He has no wheezes. He has no rales. He exhibits no tenderness.  Abdominal: Soft. Bowel sounds are normal. He exhibits no distension and no mass. There is no tenderness.  Musculoskeletal: Normal range of motion. He exhibits no edema or tenderness.  left above knee amputation stump non tender or swelling. No edema at right lower extremity  Lymphadenopathy:    He has no cervical adenopathy.  Neurological: He is alert and oriented to person, place, and time. No cranial nerve deficit. He exhibits normal  muscle tone. Coordination normal.  Skin:  Skin is warm and dry. No rash noted. He is not diaphoretic. No erythema.  Psychiatric: Judgment normal. He has a flat affect.      LABORATORY DATA: I have personally reviewed the data as listed:  Orders Only on 07/05/2018  Component Date Value Ref Range Status  . Iron 07/05/2018 56  45 - 182 ug/dL Final  . TIBC 07/05/2018 285  250 - 450 ug/dL Final  . Saturation Ratios 07/05/2018 20  17.9 - 39.5 % Final  . UIBC 07/05/2018 229  ug/dL Final   Performed at [facility], [address], [place], [place] [address]  . Ferritin 07/05/2018 88  24 - 336 ng/mL Final   Performed at [facility], [address] [place], [place] [address]  . WBC 07/05/2018 9.4  4.0 - 10.5 K/uL Final  . RBC 07/05/2018 5.73  4.22 - 5.81 MIL/uL Final  . Hemoglobin 07/05/2018 14.9  13.0 - 17.0 g/dL Final  . HCT 07/05/2018 47.4  39.0 - 52.0 % Final  . MCV 07/05/2018 82.7  80.0 - 100.0 fL Final  . MCH 07/05/2018 26.0  26.0 - 34.0 pg Final  . MCHC 07/05/2018 31.4  30.0 - 36.0 g/dL Final  . RDW 07/05/2018 15.3  11.5 - 15.5 % Final  . Platelets 07/05/2018 247  150 - 400 K/uL Final  . nRBC 07/05/2018 0.0  0.0 - 0.2 % Final  . Neutrophils Relative % 07/05/2018 64  % Final  . Neutro Abs 07/05/2018 6.1  1.7 - 7.7 K/uL Final  . Lymphocytes Relative 07/05/2018 24  % Final  . Lymphs Abs 07/05/2018 2.2  0.7 - 4.0 K/uL Final  . Monocytes Relative 07/05/2018 8  % Final  . Monocytes Absolute 07/05/2018 0.8  0.1 - 1.0 K/uL Final  . Eosinophils Relative 07/05/2018 2  % Final  . Eosinophils Absolute 07/05/2018 0.2  0.0 - 0.5 K/uL Final  . Basophils Relative 07/05/2018 1  % Final  . Basophils Absolute 07/05/2018 0.1  0.0 - 0.1 K/uL Final  . Immature Granulocytes 07/05/2018 1  % Final  . Abs Immature Granulocytes 07/05/2018 0.05  0.00 - 0.07 K/uL Final   Performed at [facility], [address] [place], [place] [address]    RADIOGRAPHIC STUDIES: I have  personally reviewed the radiological images as listed and agree with the findings in the report 04/03/2017 US venous doppler IMPRESSION: Left inguinal/femoral partially thrombosed pseudoaneurysm/hematoma measuring 15 x 5.3 x 6.8 cm.  Reviewed image records from [facility] [place] [facility] 11/26/2016 patient had a venous Doppler extremity left done which showed no occluding acute DVT at the left common femoral vein. 03/03/2017 patient had another venous Doppler extremity left which showed deep vein thromboses involving the proximal to distal femoral vein the common femoral artery now has normal compression.  04/05/2013 CT chest with contrast    Chronic pulmonary disease with areas of atelectasis and bronchiectasis. Continued significant opacification and bullous change in the left lung especially in the upper lobe and superior segment of the  left lower lobe with other patchy areas bilaterally. There may be mild interval improvement in the area of the lower lingula. Malignancy cannot be excluded in the abnormal areas of the lungs. No central pulmonary arterial filling defect is seen. The bolus is not optimal for evaluation of the lungs  otherwise.    ASSESSMENT/PLAN 1. Iron deficiency anemia due to chronic blood loss   2. Recurrent deep vein thrombosis (DVT) (HCC)   3. Anticoagulation  management encounter   4. Presence of IVC filter    # Labs reviewed and discussed with patient.   Hemoglobin has been stable as well as iron panel.   # He is back on anticoagulation with Eliquis 5mg  BID. US done at nursing home not available to me. Will request.  recommend Eliquis 5mg  BID for 6 months, followed by 2.5mg  BID for maintenance.   # History of GI bleeding while on anticoagulation He needs to follow up with GI for repeating endoscopy due to poor prep.   # Previously ambiguous anti-phospholipid syndrome panel [ clarify his previously prolonged DVRRT. ] due to being on DOACs.  Repeat anti  phospholipid panel negative.  # Presence of IVC filter, follow up with vascular surgery.   Follow up in 6 months.  Total face to face encounter time for this patient visit was 25 min. >50% of the time was  spent in counseling and coordination of care.   Earlie Server, MD, PhD Hematology Oncology [facility] at [facility] Pager- [phone]

## 2018-07-12 ENCOUNTER — Telehealth: Payer: Self-pay

## 2018-07-12 DIAGNOSIS — D649 Anemia, unspecified: Secondary | ICD-10-CM | POA: Diagnosis not present

## 2018-07-12 NOTE — Telephone Encounter (Signed)
Called [facility] [employer] and spoke to [place], nurse, to request patient's most recent ultrasound results.

## 2018-07-20 DIAGNOSIS — R05 Cough: Secondary | ICD-10-CM | POA: Diagnosis not present

## 2018-07-20 DIAGNOSIS — R040 Epistaxis: Secondary | ICD-10-CM | POA: Diagnosis not present

## 2018-07-22 DIAGNOSIS — R040 Epistaxis: Secondary | ICD-10-CM | POA: Diagnosis not present

## 2018-07-22 DIAGNOSIS — J984 Other disorders of lung: Secondary | ICD-10-CM | POA: Diagnosis not present

## 2018-07-22 DIAGNOSIS — M7989 Other specified soft tissue disorders: Secondary | ICD-10-CM | POA: Diagnosis not present

## 2018-07-22 DIAGNOSIS — J40 Bronchitis, not specified as acute or chronic: Secondary | ICD-10-CM | POA: Diagnosis not present

## 2018-07-23 DIAGNOSIS — I82412 Acute embolism and thrombosis of left femoral vein: Secondary | ICD-10-CM | POA: Diagnosis not present

## 2018-07-27 DIAGNOSIS — S39011A Strain of muscle, fascia and tendon of abdomen, initial encounter: Secondary | ICD-10-CM | POA: Diagnosis not present

## 2018-07-27 DIAGNOSIS — R600 Localized edema: Secondary | ICD-10-CM | POA: Diagnosis not present

## 2018-08-17 DIAGNOSIS — Z0189 Encounter for other specified special examinations: Secondary | ICD-10-CM | POA: Diagnosis not present

## 2018-08-17 DIAGNOSIS — Z79899 Other long term (current) drug therapy: Secondary | ICD-10-CM | POA: Diagnosis not present

## 2018-09-27 ENCOUNTER — Ambulatory Visit (INDEPENDENT_AMBULATORY_CARE_PROVIDER_SITE_OTHER): Payer: Medicare Other | Admitting: Nurse Practitioner

## 2018-09-27 ENCOUNTER — Encounter (INDEPENDENT_AMBULATORY_CARE_PROVIDER_SITE_OTHER): Payer: Self-pay | Admitting: Nurse Practitioner

## 2018-09-27 ENCOUNTER — Ambulatory Visit (INDEPENDENT_AMBULATORY_CARE_PROVIDER_SITE_OTHER): Payer: Medicare Other

## 2018-09-27 VITALS — BP 139/72 | HR 72 | Resp 14 | Wt 220.0 lb

## 2018-09-27 DIAGNOSIS — I10 Essential (primary) hypertension: Secondary | ICD-10-CM

## 2018-09-27 DIAGNOSIS — I739 Peripheral vascular disease, unspecified: Secondary | ICD-10-CM

## 2018-09-27 DIAGNOSIS — Z87891 Personal history of nicotine dependence: Secondary | ICD-10-CM | POA: Diagnosis not present

## 2018-09-27 DIAGNOSIS — E785 Hyperlipidemia, unspecified: Secondary | ICD-10-CM

## 2018-09-28 ENCOUNTER — Encounter (INDEPENDENT_AMBULATORY_CARE_PROVIDER_SITE_OTHER): Payer: Self-pay

## 2018-09-28 ENCOUNTER — Telehealth (INDEPENDENT_AMBULATORY_CARE_PROVIDER_SITE_OTHER): Payer: Self-pay

## 2018-09-28 NOTE — Telephone Encounter (Signed)
I spoke with [name] at [facility] and gave her the pre-procedure instructions for the patient I also faxed the paperwork over to [facility] attention.

## 2018-09-30 ENCOUNTER — Other Ambulatory Visit (INDEPENDENT_AMBULATORY_CARE_PROVIDER_SITE_OTHER): Payer: Self-pay | Admitting: Nurse Practitioner

## 2018-10-01 ENCOUNTER — Encounter
Admission: RE | Admit: 2018-10-01 | Discharge: 2018-10-01 | Disposition: A | Discharge status: Home / Self Care | Payer: Medicare Other | Source: Ambulatory Visit | Attending: Vascular Surgery | Admitting: Vascular Surgery

## 2018-10-01 DIAGNOSIS — J449 Chronic obstructive pulmonary disease, unspecified: Secondary | ICD-10-CM | POA: Diagnosis not present

## 2018-10-01 DIAGNOSIS — Z79899 Other long term (current) drug therapy: Secondary | ICD-10-CM | POA: Diagnosis not present

## 2018-10-01 DIAGNOSIS — Z86718 Personal history of other venous thrombosis and embolism: Secondary | ICD-10-CM | POA: Diagnosis not present

## 2018-10-01 DIAGNOSIS — Z87891 Personal history of nicotine dependence: Secondary | ICD-10-CM | POA: Diagnosis not present

## 2018-10-01 DIAGNOSIS — K219 Gastro-esophageal reflux disease without esophagitis: Secondary | ICD-10-CM | POA: Diagnosis not present

## 2018-10-01 DIAGNOSIS — I110 Hypertensive heart disease with heart failure: Secondary | ICD-10-CM | POA: Diagnosis not present

## 2018-10-01 DIAGNOSIS — I509 Heart failure, unspecified: Secondary | ICD-10-CM | POA: Diagnosis not present

## 2018-10-01 DIAGNOSIS — Z01812 Encounter for preprocedural laboratory examination: Secondary | ICD-10-CM | POA: Insufficient documentation

## 2018-10-01 DIAGNOSIS — M109 Gout, unspecified: Secondary | ICD-10-CM | POA: Diagnosis not present

## 2018-10-01 DIAGNOSIS — E785 Hyperlipidemia, unspecified: Secondary | ICD-10-CM | POA: Diagnosis not present

## 2018-10-01 DIAGNOSIS — I70238 Atherosclerosis of native arteries of right leg with ulceration of other part of lower right leg: Secondary | ICD-10-CM | POA: Diagnosis not present

## 2018-10-01 HISTORY — DX: Cough, unspecified: R05.9

## 2018-10-01 HISTORY — DX: Pneumonia, unspecified organism: J18.9

## 2018-10-01 HISTORY — DX: Hyperlipidemia, unspecified: E78.5

## 2018-10-01 HISTORY — DX: Aneurysm of unspecified site: I72.9

## 2018-10-01 HISTORY — DX: Bronchitis, not specified as acute or chronic: J40

## 2018-10-01 HISTORY — DX: Epistaxis: R04.0

## 2018-10-01 HISTORY — DX: Gout, unspecified: M10.9

## 2018-10-01 HISTORY — DX: Gastro-esophageal reflux disease without esophagitis: K21.9

## 2018-10-01 HISTORY — DX: Acute respiratory failure, unspecified whether with hypoxia or hypercapnia: J96.00

## 2018-10-01 HISTORY — DX: Cough: R05

## 2018-10-01 HISTORY — DX: Acute embolism and thrombosis of unspecified deep veins of unspecified lower extremity: I82.409

## 2018-10-01 LAB — CREATININE, SERUM
Creatinine, Ser: 1.21 mg/dL (ref 0.61–1.24)
GFR calc Af Amer: >60 mL/min (ref >60)
GFR calc non Af Amer: >60 mL/min (ref >60)

## 2018-10-01 LAB — BUN: BUN: 13 mg/dL (ref 8–23)

## 2018-10-03 MED ORDER — DEXTROSE 5 % IV SOLN
2.0000 g | Freq: Once | INTRAVENOUS | Status: AC
  Administered 2018-10-04: 2 g via INTRAVENOUS
  Filled 2018-10-03: qty 20

## 2018-10-04 ENCOUNTER — Encounter
Admission: RE | Disposition: A | Discharge status: Skilled Nursing Facility | Payer: Self-pay | Source: Ambulatory Visit | Attending: Vascular Surgery

## 2018-10-04 ENCOUNTER — Ambulatory Visit
Admission: RE | Admit: 2018-10-04 | Discharge: 2018-10-04 | Disposition: A | Discharge status: Skilled Nursing Facility | Payer: Medicare Other | Source: Ambulatory Visit | Attending: Vascular Surgery | Admitting: Vascular Surgery

## 2018-10-04 ENCOUNTER — Other Ambulatory Visit: Payer: Self-pay

## 2018-10-04 DIAGNOSIS — T82856A Stenosis of peripheral vascular stent, initial encounter: Secondary | ICD-10-CM | POA: Diagnosis not present

## 2018-10-04 DIAGNOSIS — I70235 Atherosclerosis of native arteries of right leg with ulceration of other part of foot: Secondary | ICD-10-CM | POA: Diagnosis not present

## 2018-10-04 DIAGNOSIS — I509 Heart failure, unspecified: Secondary | ICD-10-CM | POA: Insufficient documentation

## 2018-10-04 DIAGNOSIS — J449 Chronic obstructive pulmonary disease, unspecified: Secondary | ICD-10-CM | POA: Diagnosis not present

## 2018-10-04 DIAGNOSIS — Z79899 Other long term (current) drug therapy: Secondary | ICD-10-CM | POA: Insufficient documentation

## 2018-10-04 DIAGNOSIS — I70238 Atherosclerosis of native arteries of right leg with ulceration of other part of lower right leg: Secondary | ICD-10-CM | POA: Diagnosis not present

## 2018-10-04 DIAGNOSIS — L97519 Non-pressure chronic ulcer of other part of right foot with unspecified severity: Secondary | ICD-10-CM | POA: Diagnosis not present

## 2018-10-04 DIAGNOSIS — E785 Hyperlipidemia, unspecified: Secondary | ICD-10-CM | POA: Diagnosis not present

## 2018-10-04 DIAGNOSIS — I739 Peripheral vascular disease, unspecified: Secondary | ICD-10-CM

## 2018-10-04 DIAGNOSIS — Z87891 Personal history of nicotine dependence: Secondary | ICD-10-CM | POA: Insufficient documentation

## 2018-10-04 DIAGNOSIS — I110 Hypertensive heart disease with heart failure: Secondary | ICD-10-CM | POA: Diagnosis not present

## 2018-10-04 DIAGNOSIS — Z86718 Personal history of other venous thrombosis and embolism: Secondary | ICD-10-CM | POA: Insufficient documentation

## 2018-10-04 DIAGNOSIS — M109 Gout, unspecified: Secondary | ICD-10-CM | POA: Insufficient documentation

## 2018-10-04 DIAGNOSIS — K219 Gastro-esophageal reflux disease without esophagitis: Secondary | ICD-10-CM | POA: Insufficient documentation

## 2018-10-04 HISTORY — PX: LOWER EXTREMITY ANGIOGRAPHY: CATH118251

## 2018-10-04 SURGERY — LOWER EXTREMITY ANGIOGRAPHY
Anesthesia: Moderate Sedation | Site: Leg Lower | Laterality: Right

## 2018-10-04 MED ORDER — ASPIRIN EC 81 MG PO TBEC
DELAYED_RELEASE_TABLET | ORAL | Status: AC
  Filled 2018-10-04: qty 1

## 2018-10-04 MED ORDER — HEPARIN SODIUM (PORCINE) 1000 UNIT/ML IJ SOLN
INTRAMUSCULAR | Status: DC | PRN
  Administered 2018-10-04: 5000 [IU] via INTRAVENOUS

## 2018-10-04 MED ORDER — HYDRALAZINE HCL 20 MG/ML IJ SOLN: 5.0000 mg | INTRAMUSCULAR | Status: DC | PRN

## 2018-10-04 MED ORDER — LIDOCAINE-EPINEPHRINE (PF) 1 %-1:200000 IJ SOLN
INTRAMUSCULAR | Status: AC
  Filled 2018-10-04: qty 30

## 2018-10-04 MED ORDER — ATORVASTATIN CALCIUM 10 MG PO TABS: 10.0000 mg | ORAL_TABLET | Freq: Every day | ORAL | 11 refills | Status: AC

## 2018-10-04 MED ORDER — DIPHENHYDRAMINE HCL 50 MG/ML IJ SOLN
INTRAMUSCULAR | Status: DC | PRN
  Administered 2018-10-04: 25 mg via INTRAVENOUS

## 2018-10-04 MED ORDER — SODIUM CHLORIDE 0.9% FLUSH: 3.0000 mL | Freq: Two times a day (BID) | INTRAVENOUS | Status: DC

## 2018-10-04 MED ORDER — METHYLPREDNISOLONE SODIUM SUCC 125 MG IJ SOLR: 125.0000 mg | Freq: Once | INTRAMUSCULAR | Status: DC | PRN

## 2018-10-04 MED ORDER — ACETAMINOPHEN 325 MG PO TABS: 650.0000 mg | ORAL_TABLET | ORAL | Status: DC | PRN

## 2018-10-04 MED ORDER — ONDANSETRON HCL 4 MG/2ML IJ SOLN: 4.0000 mg | Freq: Four times a day (QID) | INTRAMUSCULAR | Status: DC | PRN

## 2018-10-04 MED ORDER — FENTANYL CITRATE (PF) 100 MCG/2ML IJ SOLN
INTRAMUSCULAR | Status: AC
  Filled 2018-10-04: qty 2

## 2018-10-04 MED ORDER — MIDAZOLAM HCL 5 MG/5ML IJ SOLN
INTRAMUSCULAR | Status: AC
  Filled 2018-10-04: qty 5

## 2018-10-04 MED ORDER — FENTANYL CITRATE (PF) 100 MCG/2ML IJ SOLN
INTRAMUSCULAR | Status: DC | PRN
  Administered 2018-10-04 (×5): 25 ug via INTRAVENOUS
  Administered 2018-10-04: 50 ug via INTRAVENOUS
  Administered 2018-10-04: 25 ug via INTRAVENOUS

## 2018-10-04 MED ORDER — SODIUM CHLORIDE 0.9% FLUSH: 3.0000 mL | INTRAVENOUS | Status: DC | PRN

## 2018-10-04 MED ORDER — ASPIRIN EC 81 MG PO TBEC: 81.0000 mg | DELAYED_RELEASE_TABLET | Freq: Every day | ORAL | 2 refills | Status: DC

## 2018-10-04 MED ORDER — HEPARIN SODIUM (PORCINE) 1000 UNIT/ML IJ SOLN
INTRAMUSCULAR | Status: AC
  Filled 2018-10-04: qty 1

## 2018-10-04 MED ORDER — DIPHENHYDRAMINE HCL 50 MG/ML IJ SOLN
INTRAMUSCULAR | Status: AC
  Filled 2018-10-04: qty 1

## 2018-10-04 MED ORDER — MIDAZOLAM HCL 2 MG/2ML IJ SOLN
INTRAMUSCULAR | Status: DC | PRN
  Administered 2018-10-04: 2 mg via INTRAVENOUS
  Administered 2018-10-04 (×6): 1 mg via INTRAVENOUS

## 2018-10-04 MED ORDER — SODIUM CHLORIDE 0.9 % IV SOLN: INTRAVENOUS | Status: DC

## 2018-10-04 MED ORDER — SODIUM CHLORIDE 0.9 % IV SOLN: 250.0000 mL | INTRAVENOUS | Status: DC | PRN

## 2018-10-04 MED ORDER — DIPHENHYDRAMINE HCL 50 MG/ML IJ SOLN: 50.0000 mg | Freq: Once | INTRAMUSCULAR | Status: DC | PRN

## 2018-10-04 MED ORDER — SODIUM CHLORIDE 0.9 % IV SOLN
INTRAVENOUS | Status: DC
  Administered 2018-10-04: 09:00:00 via INTRAVENOUS

## 2018-10-04 MED ORDER — ATORVASTATIN CALCIUM 10 MG PO TABS
10.0000 mg | ORAL_TABLET | Freq: Every day | ORAL | Status: DC
  Filled 2018-10-04: qty 1

## 2018-10-04 MED ORDER — FAMOTIDINE 20 MG PO TABS: 40.0000 mg | ORAL_TABLET | Freq: Once | ORAL | Status: DC | PRN

## 2018-10-04 MED ORDER — MIDAZOLAM HCL 2 MG/ML PO SYRP: 8.0000 mg | ORAL_SOLUTION | Freq: Once | ORAL | Status: DC | PRN

## 2018-10-04 MED ORDER — ASPIRIN EC 81 MG PO TBEC
81.0000 mg | DELAYED_RELEASE_TABLET | Freq: Every day | ORAL | Status: DC
  Administered 2018-10-04: 81 mg via ORAL

## 2018-10-04 MED ORDER — HYDROMORPHONE HCL 1 MG/ML IJ SOLN: 1.0000 mg | Freq: Once | INTRAMUSCULAR | Status: DC | PRN

## 2018-10-04 MED ORDER — IOPAMIDOL (ISOVUE-300) INJECTION 61%
INTRAVENOUS | Status: DC | PRN
  Administered 2018-10-04: 75 mL via INTRA_ARTERIAL

## 2018-10-04 MED ORDER — LABETALOL HCL 5 MG/ML IV SOLN: 10.0000 mg | INTRAVENOUS | Status: DC | PRN

## 2018-10-04 MED ORDER — HEPARIN (PORCINE) IN NACL 1000-0.9 UT/500ML-% IV SOLN
INTRAVENOUS | Status: AC
  Filled 2018-10-04: qty 1000

## 2018-10-04 SURGICAL SUPPLY — 30 items
BALLN LUTONIX 018 4X150X130 (BALLOONS) ×3
BALLN LUTONIX 5X220X130 (BALLOONS) ×3
BALLN LUTONIX DCB 6X80X130 (BALLOONS) ×3
BALLN LUTONIX DCB 7X40X130 (BALLOONS) ×3
BALLN LUTONIX DCB 7X60X130 (BALLOONS) ×3
BALLN ULTRVRSE 3X300X150 (BALLOONS) ×3
BALLN ULTRVRSE 3X300X150 OTW (BALLOONS) ×1
BALLOON LUTONIX 018 4X150X130 (BALLOONS) IMPLANT
BALLOON LUTONIX 5X220X130 (BALLOONS) IMPLANT
BALLOON LUTONIX DCB 6X80X130 (BALLOONS) IMPLANT
BALLOON LUTONIX DCB 7X40X130 (BALLOONS) IMPLANT
BALLOON LUTONIX DCB 7X60X130 (BALLOONS) IMPLANT
BALLOON ULTRVRSE 3X300X150 OTW (BALLOONS) IMPLANT
CANNULA 5F STIFF (CANNULA) ×2 IMPLANT
CATH CXI SUPP ANG 4FR 135 (CATHETERS) IMPLANT
CATH CXI SUPP ANG 4FR 135CM (CATHETERS) ×3
CATH PIG 70CM (CATHETERS) ×2 IMPLANT
CATH VERT 5FR 125CM (CATHETERS) ×2 IMPLANT
DEVICE PRESTO INFLATION (MISCELLANEOUS) ×2 IMPLANT
DEVICE STARCLOSE SE CLOSURE (Vascular Products) ×2 IMPLANT
DEVICE TORQUE .025-.038 (MISCELLANEOUS) ×2 IMPLANT
GLIDEWIRE ADV .035X260CM (WIRE) ×2 IMPLANT
PACK ANGIOGRAPHY (CUSTOM PROCEDURE TRAY) ×3 IMPLANT
SHEATH ANL2 6FRX45 HC (SHEATH) ×2 IMPLANT
SHEATH BRITE TIP 5FRX11 (SHEATH) ×2 IMPLANT
STENT LIFESTAR 8X40 (Permanent Stent) ×2 IMPLANT
SYR MEDRAD MARK V 150ML (SYRINGE) ×2 IMPLANT
TUBING CONTRAST HIGH PRESS 72 (TUBING) ×2 IMPLANT
WIRE G V18X300CM (WIRE) ×4 IMPLANT
WIRE J 3MM .035X145CM (WIRE) ×2 IMPLANT

## 2018-10-04 NOTE — Progress Notes (Signed)
Dr. Lucky Cowboy at bedside, speaking with pt. Re: procedural results. Report called to Amy, RN at [facility]. RX given to pt.. Pt. Verbalized understanding of conversation.

## 2018-10-04 NOTE — H&P (Signed)
[facility] Admission History & Physical  MRN : [id number]  Timothy Juarez is a 68 y.o. ([birth year]) male who presents with chief complaint of No chief complaint on file. [name]  History of Present Illness: Patient presents today for treatment of his right lower extremity ischemia.  He has markedly reduced ABI on the 0.5 range and nonhealing ulcerations.  Has already lost his left leg secondary to longstanding PAD.  Multiple other comorbidities as listed below.  Is having pain in the right leg as well as nonhealing ulcerations.  No fevers or chills.  Current Facility-Administered Medications  Medication Dose Route Frequency Provider Last Rate Last Dose  . ceFAZolin (ANCEF) 2 g in dextrose 5 % 50 mL IVPB  2 g Intravenous Once Kris Hartmann, NP        Past Medical History:  Diagnosis Date  . Acute embolism and thombos unsp deep vn unsp lower extremity ([place])   . Acute respiratory failure ([place])   . Allergy   . Anemia   . Aneurysm of unspecified site ([place])   . ARF (acute respiratory failure) ([place])    H/O  . Bronchitis   . CHF (congestive heart failure) ([place])   . COPD (chronic obstructive pulmonary disease) ([place])   . Cough   . Epistaxis   . GERD (gastroesophageal reflux disease)   . Gout   . Hyperlipidemia   . Hypertension   . Hypokalemia   . Insomnia   . Muscle contracture    MUSCLE SPASMS, muscle weakness  . Peripheral vascular disease ([place])   . Pneumonia   . Pressure ulcer     Past Surgical History:  Procedure Laterality Date  . AMPUTATION Left 09/19/2015   Procedure: AMPUTATION BELOW KNEE;  Surgeon: Algernon Huxley, MD;  Location: [facility] ORS;  Service: Vascular;  Laterality: Left;  . AMPUTATION Left 11/01/2015   Procedure: AMPUTATION ABOVE KNEE;  Surgeon: Algernon Huxley, MD;  Location: [facility] ORS;  Service: Vascular;  Laterality: Left;  . APPLICATION OF WOUND VAC Left 10/18/2015   Procedure: APPLICATION OF WOUND VAC;  Surgeon: Algernon Huxley, MD;  Location: [facility]  ORS;  Service: Vascular;  Laterality: Left;  . COLONOSCOPY WITH PROPOFOL N/A 05/25/2017   Procedure: COLONOSCOPY WITH PROPOFOL;  Surgeon: Jonathon Bellows, MD;  Location: [facility] ENDOSCOPY;  Service: Gastroenterology;  Laterality: N/A;  . COLONOSCOPY WITH PROPOFOL N/A 10/14/2017   Procedure: COLONOSCOPY WITH PROPOFOL;  Surgeon: Jonathon Bellows, MD;  Location: [facility] ENDOSCOPY;  Service: Gastroenterology;  Laterality: N/A;  . ESOPHAGOGASTRODUODENOSCOPY (EGD) WITH PROPOFOL N/A 05/25/2017   Procedure: ESOPHAGOGASTRODUODENOSCOPY (EGD) WITH PROPOFOL;  Surgeon: Jonathon Bellows, MD;  Location: [facility] ENDOSCOPY;  Service: Gastroenterology;  Laterality: N/A;  . ESOPHAGOGASTRODUODENOSCOPY (EGD) WITH PROPOFOL N/A 10/14/2017   Procedure: ESOPHAGOGASTRODUODENOSCOPY (EGD) WITH PROPOFOL;  Surgeon: Jonathon Bellows, MD;  Location: [facility] ENDOSCOPY;  Service: Gastroenterology;  Laterality: N/A;  . GIVENS CAPSULE STUDY N/A 07/14/2017   Procedure: GIVENS CAPSULE STUDY;  Surgeon: Jonathon Bellows, MD;  Location: [facility] ENDOSCOPY;  Service: Gastroenterology;  Laterality: N/A;  . GIVENS CAPSULE STUDY N/A 10/30/2017   Procedure: GIVENS CAPSULE STUDY 12 HR;  Surgeon: Jonathon Bellows, MD;  Location: [facility] ENDOSCOPY;  Service: Gastroenterology;  Laterality: N/A;  . IVC FILTER INSERTION N/A 10/12/2017   Procedure: IVC FILTER INSERTION;  Surgeon: Algernon Huxley, MD;  Location: [place] CV LAB;  Service: Cardiovascular;  Laterality: N/A;  . PERIPHERAL VASCULAR CATHETERIZATION Left 01/18/2015   Procedure: Lower Extremity Angiography;  Surgeon: Algernon Huxley,  MD;  Location: [place] [facility] LAB;  Service: Cardiovascular;  Laterality: Left;  . PERIPHERAL VASCULAR CATHETERIZATION N/A 01/18/2015   Procedure: Lower Extremity Intervention;  Surgeon: Algernon Huxley, MD;  Location: [place] CV LAB;  Service: Cardiovascular;  Laterality: N/A;  . PERIPHERAL VASCULAR CATHETERIZATION  07/30/2015   Procedure: Lower Extremity Intervention;  Surgeon: Algernon Huxley, MD;  Location: [place] CV LAB;  Service: Cardiovascular;;  . PERIPHERAL VASCULAR CATHETERIZATION N/A 07/30/2015   Procedure: Abdominal Aortogram w/Lower Extremity;  Surgeon: Algernon Huxley, MD;  Location: [place] CV LAB;  Service: Cardiovascular;  Laterality: N/A;  . PERIPHERAL VASCULAR CATHETERIZATION Left 08/22/2015   Procedure: Lower Extremity Angiography;  Surgeon: Algernon Huxley, MD;  Location: [place] CV LAB;  Service: Cardiovascular;  Laterality: Left;  . PERIPHERAL VASCULAR CATHETERIZATION Left 08/22/2015   Procedure: Lower Extremity Intervention;  Surgeon: Algernon Huxley, MD;  Location: [place] CV LAB;  Service: Cardiovascular;  Laterality: Left;  . PERIPHERAL VASCULAR CATHETERIZATION Right 03/31/2016   Procedure: Lower Extremity Angiography;  Surgeon: Algernon Huxley, MD;  Location: [place] CV LAB;  Service: Cardiovascular;  Laterality: Right;  . PERIPHERAL VASCULAR CATHETERIZATION  03/31/2016   Procedure: Lower Extremity Intervention;  Surgeon: Algernon Huxley, MD;  Location: [place] [facility];  Service: Cardiovascular;;  . PERIPHERAL VASCULAR CATHETERIZATION Left 04/10/2016   Procedure: Lower Extremity Angiography;  Surgeon: Algernon Huxley, MD;  Location: [place] [facility];  Service: Cardiovascular;  Laterality: Left;  . WOUND DEBRIDEMENT Left 10/18/2015   Procedure: DEBRIDEMENT WOUND   ( LEFT BKA DEBRIDEMENT );  Surgeon: Algernon Huxley, MD;  Location: [facility] ORS;  Service: Vascular;  Laterality: Left;    Social History Social History   Tobacco Use  . Smoking status: Former Smoker    Packs/day: 1.00    Years: 44.00    Pack years: 44.00    Types: Cigarettes    Last attempt to quit: 11/17/2012    Years since quitting: 5.8  . Smokeless tobacco: Never Used  Substance Use Topics  . Alcohol use: No  . Drug use: No    Family History No bleeding disorders, clotting disorders, autoimmune diseases, or aneurysms  No Known Allergies   REVIEW OF SYSTEMS (Negative unless checked)  Constitutional:  [] Weight loss  [] Fever  [] Chills Cardiac: [] Chest pain   [] Chest pressure   [] Palpitations   [] Shortness of breath when laying flat   [] Shortness of breath at rest   [x] Shortness of breath with exertion. Vascular:  [] Pain in legs with walking   [] Pain in legs at rest   [] Pain in legs when laying flat   [] Claudication   [x] Pain in feet when walking  [x] Pain in feet at rest  [] Pain in feet when laying flat   [] History of DVT   [] Phlebitis   [x] Swelling in legs   [] Varicose veins   [x] Non-healing ulcers Pulmonary:   [] Uses home oxygen   [] Productive cough   [] Hemoptysis   [] Wheeze  [x] COPD   [] Asthma Neurologic:  [] Dizziness  [] Blackouts   [] Seizures   [] History of stroke   [] History of TIA  [] Aphasia   [] Temporary blindness   [] Dysphagia   [] Weakness or numbness in arms   [] Weakness or numbness in legs Musculoskeletal:  [x] Arthritis   [] Joint swelling   [x] Joint pain   [] Low back pain Hematologic:  [] Easy bruising  [] Easy bleeding   [] Hypercoagulable state   [] Anemic  [] Hepatitis Gastrointestinal:  [] Blood in stool   [] Vomiting blood  []   Gastroesophageal reflux/heartburn   [] Difficulty swallowing. Genitourinary:  [] Chronic kidney disease   [] Difficult urination  [] Frequent urination  [] Burning with urination   [] Blood in urine Skin:  [] Rashes   [x] Ulcers   [x] Wounds Psychological:  [] History of anxiety   []  History of major depression.  Physical Examination  There were no vitals filed for this visit. There is no height or weight on file to calculate BMI. Gen: WD/WN, NAD Head: [place]/AT, No temporalis wasting. Ear/Nose/Throat: Hearing grossly intact, nares w/o erythema or drainage, oropharynx w/o Erythema/Exudate,  Eyes: Conjunctiva clear, sclera non-icteric Neck: Trachea midline.  No JVD.  Pulmonary:  Good air movement, respirations not labored, no use of accessory muscles.  Cardiac: RRR, normal S1, S2. Vascular:  Vessel Right Left  Radial Palpable Palpable                          PT  not  palpable  not palpable  DP  trace palpable  not palpable   Gastrointestinal: soft, non-tender/non-distended. No guarding/reflex.  Musculoskeletal: M/S 5/5 throughout.  Extremities without ischemic changes.  Left BKA Neurologic: Sensation grossly intact in extremities.  Symmetrical.  Speech is fluent. Motor exam as listed above. Psychiatric: Judgment intact, Mood & affect appropriate for pt's clinical situation. Dermatologic: Nonhealing wounds on the right foot     CBC Lab Results  Component Value Date   WBC 9.4 07/05/2018   HGB 14.9 07/05/2018   HCT 47.4 07/05/2018   MCV 82.7 07/05/2018   PLT 247 07/05/2018    BMET    Component Value Date/Time   NA 140 10/11/2017 0338   NA 136 12/01/2013 1317   K 3.4 (L) 10/11/2017 0338   K 3.9 12/01/2013 1317   CL 110 10/11/2017 0338   CL 100 12/01/2013 1317   CO2 23 10/11/2017 0338   CO2 33 (H) 12/01/2013 1317   GLUCOSE 101 (H) 10/11/2017 0338   GLUCOSE 91 12/01/2013 1317   BUN 13 10/01/2018 0824   BUN 24 (H) 09/25/2014 0846   CREATININE 1.21 10/01/2018 0824   CREATININE 1.29 09/25/2014 0846   CALCIUM 8.2 (L) 10/11/2017 0338   CALCIUM 9.4 12/01/2013 1317   GFRNONAA >60 10/01/2018 0824   GFRNONAA 60 (L) 09/25/2014 0846   GFRNONAA >60 03/16/2014 0750   GFRAA >60 10/01/2018 0824   GFRAA >60 09/25/2014 0846   GFRAA >60 03/16/2014 0750   Estimated Creatinine Clearance: 74 mL/min (by C-G formula based on SCr of 1.21 mg/dL).  COAG Lab Results  Component Value Date   INR 2.39 10/10/2017   INR 1.39 04/10/2016   INR 1.20 10/30/2015    Radiology Vas [place] Burnard Bunting With/wo Tbi  Result Date: 09/30/2018 LOWER EXTREMITY DOPPLER STUDY Indications: Peripheral artery disease, and Lt AKA. Right Great toe amputation.  Vascular Interventions: Multiple interventions 2014-17. Rt dis SFA-PTA stent. Performing Technologist: Almira Coaster RVS  Examination Guidelines: A complete evaluation includes at minimum, Doppler waveform signals and systolic blood  pressure reading at the level of bilateral brachial, anterior tibial, and posterior tibial arteries, when vessel segments are accessible. Bilateral testing is considered an integral part of a complete examination. Photoelectric Plethysmograph (PPG) waveforms and toe systolic pressure readings are included as required and additional duplex testing as needed. Limited examinations for reoccurring indications may be performed as noted.  ABI Findings: +---------+------------------+-----+----------+----------------------+ Right    Rt Pressure (mmHg)IndexWaveform  Comment                +---------+------------------+-----+----------+----------------------+ Brachial 136                                                     +---------+------------------+-----+----------+----------------------+  ATA      77                0.53 monophasic                       +---------+------------------+-----+----------+----------------------+ PTA      92                0.64 monophasic                       +---------+------------------+-----+----------+----------------------+ Great Toe                                 Amputated Rt Great Toe +---------+------------------+-----+----------+----------------------+ +--------+------------------+-----+--------+-------+ Left    Lt Pressure (mmHg)IndexWaveformComment +--------+------------------+-----+--------+-------+ [id number]                                    +--------+------------------+-----+--------+-------+ +-------+-----------+-----------+------------+------------+ ABI/TBIToday's ABIToday's TBIPrevious ABIPrevious TBI +-------+-----------+-----------+------------+------------+ Right  .64                   .50                      +-------+-----------+-----------+------------+------------+ Right ABIs appear essentially unchanged compared to prior study on 03/24/2018.  Summary: Right: Resting right ankle-brachial index indicates moderate  right lower extremity arterial disease.  *See table(s) above for measurements and observations.  Electronically signed by Hortencia Pilar MD on 09/30/2018 at 5:57:16 PM.   Final    Vas [place] Lower Extremity Arterial Duplex  Result Date: 09/30/2018 LOWER EXTREMITY ARTERIAL DUPLEX STUDY Indications: Peripheral artery disease.  Vascular Interventions: Multiple vasc interventions, Right pop-pta stent, lt                         AKA. Current ABI:            Rt.62 Comparison Study: 03/24/2018 Performing Technologist: Almira Coaster RVS  Examination Guidelines: A complete evaluation includes B-mode imaging, spectral Doppler, color Doppler, and power Doppler as needed of all accessible portions of each vessel. Bilateral testing is considered an integral part of a complete examination. Limited examinations for reoccurring indications may be performed as noted.  Right Duplex Findings: +-----------+--------+-----+--------+----------+--------+            PSV cm/sRatioStenosisWaveform  Comments +-----------+--------+-----+--------+----------+--------+ CFA Distal 287                  monophasic         +-----------+--------+-----+--------+----------+--------+ DFA        178                  monophasic         +-----------+--------+-----+--------+----------+--------+ SFA Prox   35                   monophasic         +-----------+--------+-----+--------+----------+--------+ SFA Mid    31                   monophasic         +-----------+--------+-----+--------+----------+--------+ SFA Distal 38                   monophasic         +-----------+--------+-----+--------+----------+--------+ POP Distal 47  monophasic         +-----------+--------+-----+--------+----------+--------+ ATA Distal 27                   monophasic         +-----------+--------+-----+--------+----------+--------+ PTA Distal 26                   monophasic          +-----------+--------+-----+--------+----------+--------+ PERO Distal22                   monophasic         +-----------+--------+-----+--------+----------+--------+  Right Stent(s): +--------------+---++----------+-----------------------+ Prox to Stent 96monophasic                        +--------------+---++----------+-----------------------+ Proximal Stent35 monophasic                        +--------------+---++----------+-----------------------+ Mid Stent     31 monophasic                        +--------------+---++----------+-----------------------+ Distal Stent  43 monophasicDistal Popliteal Artery +--------------+---++----------+-----------------------+  Summary: See table(s) above for measurements and observations. Electronically signed by Hortencia Pilar MD on 09/30/2018 at 5:57:23 PM.    Final     Assessment/Plan 1. PAD with ulceration RLE.  Flow to the right leg is markedly diminished at this point.  Already has left leg amputation from PAD.  At this point, should have a right leg angiogram with possible revascularization.  Risks and benefits are discussed. 2.  Hypertension.  Stable on outpatient medications and blood pressure control important in reducing the progression of atherosclerotic disease. On appropriate oral medications. 3.  Hyperlipidemia.  Stable on outpatient medications and lipid control important in reducing the progression of atherosclerotic disease. Continue statin therapy    [name], MD  10/04/2018 8:45 AM

## 2018-10-04 NOTE — Op Note (Signed)
[facility]  Percutaneous Study/Intervention Procedural Note   Date of Surgery: 10/04/2018  Surgeon(s):DEW,JASON    Assistants:none  Pre-operative Diagnosis: PAD with ulceration right lower extremity  Post-operative diagnosis:  Same  Procedure(s) Performed:             1.  Ultrasound guidance for vascular access left femoral artery             2.  Catheter placement into right common femoral artery from left femoral approach             3.  Aortogram and selective right lower extremity angiogram             4.  Percutaneous transluminal angioplasty of left external iliac artery with 6 mm diameter by 8 cm length Lutonix drug-coated angioplasty balloon             5.   Percutaneous transluminal angioplasty of right external iliac artery with 7 mm diameter by 6 cm length Lutonix drug-coated angioplasty balloon  6.  Percutaneous transluminal angioplasty of right popliteal artery with 5 mm diameter by 22 cm length Lutonix drug-coated angioplasty balloon  7.  Percutaneous transluminal angioplasty of the right proximal to mid anterior tibial artery with 4 mm diameter by 15 cm length Lutonix drug-coated angioplasty balloon  8.  Percutaneous transluminal angioplasty of the right posterior tibial artery from the tibioperoneal trunk to the foot with 2 inflations with a 3 mm diameter by 30 cm length angioplasty balloon  9.  Percutaneous transluminal angioplasty of the right peroneal artery and tibioperoneal trunk with a 3 mm diameter by 30 cm length angioplasty balloon  10.  Self-expanding stent placement to the right external iliac artery for greater than 50% residual stenosis after angioplasty with an 8 mm diameter by 4 cm length life star stent             11.  StarClose closure device left femoral artery  EBL: 10 cc  Contrast: 75 cc  Fluoro Time: 12.1 minutes  Moderate Conscious Sedation Time: approximately 60 minutes using 8 mg of Versed and 200 Mcg of Fentanyl               Indications:  Patient is a 68 y.o.male with nonhealing ulcerations on the right foot with a long history of severe peripheral arterial disease.  He has already lost his left leg. The patient has noninvasive study showing markedly reduced perfusion on the right. The patient is brought in for angiography for further evaluation and potential treatment.  Due to the limb threatening nature of the situation, angiogram was performed for attempted limb salvage. The patient is aware that if the procedure fails, amputation would be expected.  The patient also understands that even with successful revascularization, amputation may still be required due to the severity of the situation.  Risks and benefits are discussed and informed consent is obtained.   Procedure:  The patient was identified and appropriate procedural time out was performed.  The patient was then placed supine on the table and prepped and draped in the usual sterile fashion. Moderate conscious sedation was administered during a face to face encounter with the patient throughout the procedure with my supervision of the RN administering medicines and monitoring the patient's vital signs, pulse oximetry, telemetry and mental status throughout from the start of the procedure until the patient was taken to the recovery room. Ultrasound was used to evaluate the left common femoral artery.  It was patent  but diseased and had poor inflow.  A digital ultrasound image was acquired.  A Seldinger needle was used to access the left common femoral artery under direct ultrasound guidance and a permanent image was performed.  A 0.035 J wire was advanced without resistance and a 5Fr sheath was placed.  Pigtail catheter was placed into the aorta and an AP aortogram was performed. This demonstrated that the renal arteries appear to be patent.  The aorta was patent.  The left common iliac artery did not have any significant stenosis but the left external iliac artery  at the proximal portion of the previously placed stent had about a 90% stenosis.  The right common iliac artery had mild stenosis of less than 50% but the right external iliac artery in the mid to distal segment had about an 80% stenosis. I then crossed the aortic bifurcation and advanced to the right femoral head. Selective right lower extremity angiogram was then performed. This demonstrated what appeared to be mild stenosis of the common femoral artery.  He had a long segment stent placements throughout the right SFA and popliteal arteries.  Throughout the majority of the stents down to about Hunter's canal these were patent with less than 20 to 30% stenosis.  In the popliteal artery there was about 50% stenosis within the stent and then occlusion of the distal popliteal artery below the stent.  There was occlusion of all 3 tibial vessels on initial imaging with reconstitution of the anterior tibial artery and the proximal to mid segment but then occlusion distally in the anterior tibial artery.  The peroneal artery reconstituted in the proximal to mid segment and was then continuous distally.  The posterior tibial artery had a long segment occlusion to the foot. It was felt that it was in the patient's best interest to proceed with intervention after these images to avoid a second procedure and a larger amount of contrast and fluoroscopy based off of the findings from the initial angiogram. The patient was systemically heparinized and I started by addressing the iliac disease before putting the up and over sheath then.  The left external iliac artery was treated with a 6 mm diameter by 8 cm length Lutonix drug-coated angioplasty balloon inflated to 12 atm for 1 minute.  Completion imaging showed only about 10% residual stenosis in the left external iliac artery.  The right external iliac artery was then treated with a 7 mm diameter by 6 cm length Lutonix drug-coated angioplasty balloon inflated to 10 atm for 1  minute.  Completion imaging showed about a 50-60 % residual stenosis and I would place a stent in there at the completion of her right lower extremity intervention.  I then turned my attention to the right leg disease and a 6 [place] Ansell sheath was then placed over the [employer] wire. I then used a Kumpe catheter and the advantage wire to navigate down the SFA and then through the popliteal and anterior tibial occlusion.  I exchanged for a CXI catheter and confirmed intraluminal flow in the anterior tibial artery below the level of occlusion.  I then placed a 0.018 wire.  The anterior tibial artery was then treated with a 4 mm diameter by 15 cm length Lutonix drug-coated angioplasty balloon in the proximal to mid segment inflated to 8 atm for 1 minute.  The popliteal stenosis and occlusion was then treated with a 5 mm diameter by 22 cm length Lutonix drug-coated angioplasty balloon inflated to 12 atm for  1 minute.  The popliteal artery now had less than 20% residual stenosis in the tibioperoneal trunk could be seen.  The anterior tibial artery had about 40% residual stenosis in the proximal to mid segment but remained occluded distally with no reconstitution in the foot.  I then turned my attention to the tibioperoneal trunk and exchanged back for the advantage wire and the Kumpe catheter.  The wire preferentially went down the posterior tibial artery and went all the way into the foot with the help of the CXI catheter.  Intraluminal flow in the foot was confirmed with a CXI catheter in the posterior tibial artery and I replaced a 0.018 wire.  2 inflations with a 3 mm diameter by 30 cm length angioplasty balloon from the foot up to the tibioperoneal trunk and the posterior tibial artery were performed.  Both were about 8 to 10 atm for 1 minute.  This still had somewhat sluggish flow distally as the pedal portion of the posterior tibial artery remained occluded.  I then turned my attention to the peroneal  artery.  Using the Kumpe catheter and the advantage wire I was able to gain access to the peroneal artery and cross the occlusion without difficulty confirming intraluminal flow in the peroneal artery with selective imaging.  I then replaced a 0.018 wire and proceeded with treatment of the peroneal artery.  The 3 mm diameter by 30 cm length angioplasty balloon was inflated from the mid peroneal artery up through the proximal peroneal artery and the tibioperoneal trunk and inflated to 12 atm for 1 minute.  Completion imaging showed about a 20% residual stenosis in the peroneal artery that was not flow-limiting and this was now the best runoff distally.  I then pulled the sheath back to the mid iliac system and elected to place an 8 mm diameter by 4 cm length life star stent in the right external iliac artery for the residual lesion after angioplasty.  This was postdilated with a 7 mm balloon with excellent angiographic completion result and no significant residual stenosis. I elected to terminate the procedure. The sheath was removed and StarClose closure device was deployed in the left femoral artery with excellent hemostatic result. The patient was taken to the recovery room in stable condition having tolerated the procedure well.  Findings:               Aortogram:  The renal arteries appear to be patent.  The aorta was patent.  The left common iliac artery did not have any significant stenosis but the left external iliac artery at the proximal portion of the previously placed stent had about a 90% stenosis.  The right common iliac artery had mild stenosis of less than 50% but the right external iliac artery in the mid to distal segment had about an 80% stenosis.             Right lower Extremity:  This demonstrated what appeared to be mild stenosis of the common femoral artery.  He had a long segment stent placements throughout the right SFA and popliteal arteries.  Throughout the majority of the stents down  to about Hunter's canal these were patent with less than 20 to 30% stenosis.  In the popliteal artery there was about 50% stenosis within the stent and then occlusion of the distal popliteal artery below the stent.  There was occlusion of all 3 tibial vessels on initial imaging with reconstitution of the anterior tibial artery and the proximal  to mid segment but then occlusion distally in the anterior tibial artery.  The peroneal artery reconstituted in the proximal to mid segment and was then continuous distally.  The posterior tibial artery had a long segment occlusion to the foot   Disposition: Patient was taken to the recovery room in stable condition having tolerated the procedure well.  Complications: None  [name] 10/04/2018 11:58 AM   This note was created with Dragon Medical transcription system. Any errors in dictation are purely unintentional.

## 2018-10-05 ENCOUNTER — Encounter: Payer: Self-pay | Admitting: Vascular Surgery

## 2018-10-07 DIAGNOSIS — I5189 Other ill-defined heart diseases: Secondary | ICD-10-CM | POA: Diagnosis not present

## 2018-10-07 DIAGNOSIS — I82512 Chronic embolism and thrombosis of left femoral vein: Secondary | ICD-10-CM | POA: Diagnosis not present

## 2018-10-07 DIAGNOSIS — I10 Essential (primary) hypertension: Secondary | ICD-10-CM | POA: Diagnosis not present

## 2018-10-07 DIAGNOSIS — I7389 Other specified peripheral vascular diseases: Secondary | ICD-10-CM | POA: Diagnosis not present

## 2018-10-07 DIAGNOSIS — K219 Gastro-esophageal reflux disease without esophagitis: Secondary | ICD-10-CM | POA: Diagnosis not present

## 2018-10-07 DIAGNOSIS — J984 Other disorders of lung: Secondary | ICD-10-CM | POA: Diagnosis not present

## 2018-10-10 ENCOUNTER — Encounter (INDEPENDENT_AMBULATORY_CARE_PROVIDER_SITE_OTHER): Payer: Self-pay | Admitting: Nurse Practitioner

## 2018-10-10 NOTE — Progress Notes (Signed)
SUBJECTIVE:  Patient ID: Timothy Juarez, male    DOB: 11/20/1950, 68 y.o.   MRN: [id number] Chief Complaint  Patient presents with  . Follow-up    HPI  Timothy Juarez is a 68 y.o. male The patient returns to the office for followup and review of the noninvasive studies. There has been a significant deterioration in the lower extremity symptoms.  The patient notes interval shortening of their claudication distance and development of mild rest pain symptoms. No new ulcers or wounds have occurred since the last visit.  There have been no significant changes to the patient's overall health care.  The patient denies amaurosis fugax or recent TIA symptoms. There are no recent neurological changes noted. The patient denies history of DVT, PE or superficial thrombophlebitis. The patient denies recent episodes of angina or shortness of breath.   ABI's Rt=0.64 and Lt=n/a (previous ABI's Rt=0.50 and Lt=n/a) Duplex US of the lower extremity arterial system shows monophasic waveforms throught the right lower extremity.   Past Medical History:  Diagnosis Date  . Acute embolism and thombos unsp deep vn unsp lower extremity ([place])   . Acute respiratory failure ([place])   . Allergy   . Anemia   . Aneurysm of unspecified site ([place])   . ARF (acute respiratory failure) ([place])    H/O  . Bronchitis   . CHF (congestive heart failure) ([place])   . COPD (chronic obstructive pulmonary disease) ([place])   . Cough   . Epistaxis   . GERD (gastroesophageal reflux disease)   . Gout   . Hyperlipidemia   . Hypertension   . Hypokalemia   . Insomnia   . Muscle contracture    MUSCLE SPASMS, muscle weakness  . Peripheral vascular disease ([place])   . Pneumonia   . Pressure ulcer     Past Surgical History:  Procedure Laterality Date  . AMPUTATION Left 09/19/2015   Procedure: AMPUTATION BELOW KNEE;  Surgeon: Algernon Huxley, MD;  Location: [facility] ORS;  Service: Vascular;  Laterality: Left;  . AMPUTATION Left  11/01/2015   Procedure: AMPUTATION ABOVE KNEE;  Surgeon: Algernon Huxley, MD;  Location: [facility] ORS;  Service: Vascular;  Laterality: Left;  . APPLICATION OF WOUND VAC Left 10/18/2015   Procedure: APPLICATION OF WOUND VAC;  Surgeon: Algernon Huxley, MD;  Location: [facility] ORS;  Service: Vascular;  Laterality: Left;  . COLONOSCOPY WITH PROPOFOL N/A 05/25/2017   Procedure: COLONOSCOPY WITH PROPOFOL;  Surgeon: Jonathon Bellows, MD;  Location: [facility] ENDOSCOPY;  Service: Gastroenterology;  Laterality: N/A;  . COLONOSCOPY WITH PROPOFOL N/A 10/14/2017   Procedure: COLONOSCOPY WITH PROPOFOL;  Surgeon: Jonathon Bellows, MD;  Location: [facility] ENDOSCOPY;  Service: Gastroenterology;  Laterality: N/A;  . ESOPHAGOGASTRODUODENOSCOPY (EGD) WITH PROPOFOL N/A 05/25/2017   Procedure: ESOPHAGOGASTRODUODENOSCOPY (EGD) WITH PROPOFOL;  Surgeon: Jonathon Bellows, MD;  Location: [facility] ENDOSCOPY;  Service: Gastroenterology;  Laterality: N/A;  . ESOPHAGOGASTRODUODENOSCOPY (EGD) WITH PROPOFOL N/A 10/14/2017   Procedure: ESOPHAGOGASTRODUODENOSCOPY (EGD) WITH PROPOFOL;  Surgeon: Jonathon Bellows, MD;  Location: [facility] ENDOSCOPY;  Service: Gastroenterology;  Laterality: N/A;  . GIVENS CAPSULE STUDY N/A 07/14/2017   Procedure: GIVENS CAPSULE STUDY;  Surgeon: Jonathon Bellows, MD;  Location: [facility] ENDOSCOPY;  Service: Gastroenterology;  Laterality: N/A;  . GIVENS CAPSULE STUDY N/A 10/30/2017   Procedure: GIVENS CAPSULE STUDY 12 HR;  Surgeon: Jonathon Bellows, MD;  Location: [facility] ENDOSCOPY;  Service: Gastroenterology;  Laterality: N/A;  . IVC FILTER INSERTION N/A 10/12/2017   Procedure: IVC FILTER INSERTION;  Surgeon: Algernon Huxley,  MD;  Location: [place] CV LAB;  Service: Cardiovascular;  Laterality: N/A;  . LOWER EXTREMITY ANGIOGRAPHY Right 10/04/2018   Procedure: LOWER EXTREMITY ANGIOGRAPHY;  Surgeon: Algernon Huxley, MD;  Location: [place] CV LAB;  Service: Cardiovascular;  Laterality: Right;  . PERIPHERAL VASCULAR CATHETERIZATION Left 01/18/2015   Procedure: Lower Extremity  Angiography;  Surgeon: Algernon Huxley, MD;  Location: [place] CV LAB;  Service: Cardiovascular;  Laterality: Left;  . PERIPHERAL VASCULAR CATHETERIZATION N/A 01/18/2015   Procedure: Lower Extremity Intervention;  Surgeon: Algernon Huxley, MD;  Location: [place] CV LAB;  Service: Cardiovascular;  Laterality: N/A;  . PERIPHERAL VASCULAR CATHETERIZATION  07/30/2015   Procedure: Lower Extremity Intervention;  Surgeon: Algernon Huxley, MD;  Location: [place] CV LAB;  Service: Cardiovascular;;  . PERIPHERAL VASCULAR CATHETERIZATION N/A 07/30/2015   Procedure: Abdominal Aortogram w/Lower Extremity;  Surgeon: Algernon Huxley, MD;  Location: [place] CV LAB;  Service: Cardiovascular;  Laterality: N/A;  . PERIPHERAL VASCULAR CATHETERIZATION Left 08/22/2015   Procedure: Lower Extremity Angiography;  Surgeon: Algernon Huxley, MD;  Location: [place] CV LAB;  Service: Cardiovascular;  Laterality: Left;  . PERIPHERAL VASCULAR CATHETERIZATION Left 08/22/2015   Procedure: Lower Extremity Intervention;  Surgeon: Algernon Huxley, MD;  Location: [place] CV LAB;  Service: Cardiovascular;  Laterality: Left;  . PERIPHERAL VASCULAR CATHETERIZATION Right 03/31/2016   Procedure: Lower Extremity Angiography;  Surgeon: Algernon Huxley, MD;  Location: [place] CV LAB;  Service: Cardiovascular;  Laterality: Right;  . PERIPHERAL VASCULAR CATHETERIZATION  03/31/2016   Procedure: Lower Extremity Intervention;  Surgeon: Algernon Huxley, MD;  Location: [place] CV LAB;  Service: Cardiovascular;;  . PERIPHERAL VASCULAR CATHETERIZATION Left 04/10/2016   Procedure: Lower Extremity Angiography;  Surgeon: Algernon Huxley, MD;  Location: [place] CV LAB;  Service: Cardiovascular;  Laterality: Left;  . WOUND DEBRIDEMENT Left 10/18/2015   Procedure: DEBRIDEMENT WOUND   ( LEFT BKA DEBRIDEMENT );  Surgeon: Algernon Huxley, MD;  Location: [facility] ORS;  Service: Vascular;  Laterality: Left;    Social History   Socioeconomic History  .  Marital status: Legally Separated    Spouse name: Not on file  . Number of children: Not on file  . Years of education: Not on file  . Highest education level: Not on file  Occupational History  . Not on file  Social Needs  . Financial resource strain: Not on file  . Food insecurity:    Worry: Not on file    Inability: Not on file  . Transportation needs:    Medical: Not on file    Non-medical: Not on file  Tobacco Use  . Smoking status: Former Smoker    Packs/day: 1.00    Years: 44.00    Pack years: 44.00    Types: Cigarettes    Last attempt to quit: 11/17/2012    Years since quitting: 5.8  . Smokeless tobacco: Never Used  Substance and Sexual Activity  . Alcohol use: No  . Drug use: No  . Sexual activity: Never  Lifestyle  . Physical activity:    Days per week: Not on file    Minutes per session: Not on file  . Stress: Not on file  Relationships  . Social connections:    Talks on phone: Not on file    Gets together: Not on file    Attends religious service: Not on file    Active member of club or organization: Not on  file    Attends meetings of clubs or organizations: Not on file    Relationship status: Not on file  . Intimate partner violence:    Fear of current or ex partner: Not on file    Emotionally abused: Not on file    Physically abused: Not on file    Forced sexual activity: Not on file  Other Topics Concern  . Not on file  Social History Narrative  . Not on file    History reviewed. No pertinent family history.  No Known Allergies   Review of Systems   Review of Systems: Negative Unless Checked Constitutional: [] Weight loss  [] Fever  [] Chills Cardiac: [] Chest pain   []  Atrial Fibrillation  [] Palpitations   [] Shortness of breath when laying flat   [] Shortness of breath with exertion. [] Shortness of breath at rest Vascular:  [] Pain in legs with walking   [] Pain in legs with standing [] Pain in legs when laying flat   [] Claudication    [] Pain in  feet when laying flat    [x] History of DVT   [] Phlebitis   [] Swelling in legs   [] Varicose veins   [] Non-healing ulcers Pulmonary:   [] Uses home oxygen   [] Productive cough   [] Hemoptysis   [] Wheeze  [] COPD   [] Asthma Neurologic:  [] Dizziness   [] Seizures  [] Blackouts [] History of stroke   [] History of TIA  [] Aphasia   [] Temporary Blindness   [] Weakness or numbness in arm   [x] Weakness or numbness in leg Musculoskeletal:   [] Joint swelling   [] Joint pain   [] Low back pain  []  History of Knee Replacement [] Arthritis [] back Surgeries  []  Spinal Stenosis    Hematologic:  [] Easy bruising  [] Easy bleeding   [x] Hypercoagulable state   [x] Anemic Gastrointestinal:  [] Diarrhea   [] Vomiting  [] Gastroesophageal reflux/heartburn   [] Difficulty swallowing. [] Abdominal pain Genitourinary:  [] Chronic kidney disease   [] Difficult urination  [] Anuric   [] Blood in urine [] Frequent urination  [] Burning with urination   [] Hematuria Skin:  [] Rashes   [] Ulcers [] Wounds Psychological:  [] History of anxiety   []  History of major depression  [x]  Memory Difficulties      OBJECTIVE:   Physical Exam  BP 139/72 (BP Location: Right Arm, Patient Position: Sitting)   Pulse 72   Resp 14   Wt 220 lb (99.8 kg)   BMI 29.03 kg/m   Gen: WD/WN, NAD Head: [place]/AT, No temporalis wasting.  Ear/Nose/Throat: Hearing grossly intact, nares w/o erythema or drainage Eyes: PER, EOMI, sclera nonicteric.  Neck: Supple, no masses.  No JVD.  Pulmonary:  Good air movement, no use of accessory muscles.  Cardiac: RRR Vascular:  Vessel Right Left  Radial Palpable Palpable  Dorsalis Pedis Palpable   Posterior Tibial Palpable    Gastrointestinal: soft, non-distended. No guarding/no peritoneal signs.  Musculoskeletal: M/S 5/5 throughout.  Left AKA Neurologic: Pain and light touch intact in extremities.  Symmetrical.  Speech is fluent. Motor exam as listed above. Psychiatric: Judgment intact, Mood & affect appropriate for pt's clinical  situation. Dermatologic: No Venous rashes. No Ulcers Noted.  No changes consistent with cellulitis. Lymph : No Cervical lymphadenopathy, no lichenification or skin changes of chronic lymphedema.       ASSESSMENT AND PLAN:  1. PAD (peripheral artery disease) (HCC)  Recommend:  The patient has evidence of severe atherosclerotic changes of both lower extremities associated with ulceration and tissue loss of the foot.  This represents a limb threatening ischemia and places the patient at the risk for limb loss.  Patient should undergo angiography of the right lower extremities with the hope for intervention for limb salvage.  The risks and benefits as well as the alternative therapies was discussed in detail with the patient.  All questions were answered.  Patient agrees to proceed with angiography.  The patient will follow up with me in the office after the procedure.    2. Essential hypertension Continue antihypertensive medications as already ordered, these medications have been reviewed and there are no changes at this time.   3. Hyperlipidemia, unspecified hyperlipidemia type Continue statin as ordered and reviewed, no changes at this time    Current Outpatient Medications on File Prior to Visit  Medication Sig Dispense Refill  . acetaminophen (TYLENOL) 325 MG tablet Take 650 mg by mouth at bedtime.     . baclofen (LIORESAL) 10 MG tablet Take 10 mg by mouth 3 (three) times daily. Hold for sedation    . cyclobenzaprine (FLEXERIL) 5 MG tablet Take 5 mg by mouth at bedtime as needed for muscle spasms. Take 1.5 tabs =7.5mg  prn    . ELIQUIS 5 MG TABS tablet Take 5 mg by mouth 2 (two) times daily.    . fentaNYL (DURAGESIC - DOSED MCG/HR) 12 MCG/HR Place 1 patch (12.5 mcg total) onto the skin every 3 (three) days. 5 patch 0  . ferrous sulfate 325 (65 FE) MG EC tablet Take 1 tablet (325 mg total) by mouth 3 (three) times daily with meals. (Patient taking differently: Take 325 mg by mouth  daily with breakfast. ) 90 tablet 3  . furosemide (LASIX) 40 MG tablet Take 1 tablet (40 mg total) by mouth daily. 30 tablet   . gabapentin (NEURONTIN) 100 MG capsule Take 100 mg by mouth at bedtime.    [name] guaiFENesin (ROBITUSSIN) 100 MG/5ML SOLN Take 15 mLs by mouth 3 (three) times daily as needed for cough or to loosen phlegm.    . nitroGLYCERIN (NITROSTAT) 0.4 MG SL tablet Place 0.14 mg under the tongue every 5 (five) minutes as needed for chest pain.    . Omega-3 Fatty Acids (FISH OIL) 1000 MG CAPS Take 1,000 mg by mouth every morning.    [name] oxyCODONE 10 MG TABS Take 1 tablet (10 mg total) by mouth 3 (three) times daily as needed (moderate pain). 15 tablet 0  . potassium chloride SA (K-DUR,KLOR-CON) 20 MEQ tablet Take 20 mEq by mouth daily. With or after meal    . TRELEGY ELLIPTA 100-62.5-25 MCG/INH AEPB Inhale 1 puff into the lungs daily.      No current facility-administered medications on file prior to visit.     There are no Patient Instructions on file for this visit. No follow-ups on file.   Kris Hartmann, NP  This note was completed with Sales executive.  Any errors are purely unintentional.

## 2018-10-25 DIAGNOSIS — D649 Anemia, unspecified: Secondary | ICD-10-CM | POA: Diagnosis not present

## 2018-10-29 ENCOUNTER — Other Ambulatory Visit (INDEPENDENT_AMBULATORY_CARE_PROVIDER_SITE_OTHER): Payer: Self-pay | Admitting: Vascular Surgery

## 2018-10-29 DIAGNOSIS — Z9862 Peripheral vascular angioplasty status: Secondary | ICD-10-CM

## 2018-11-02 ENCOUNTER — Ambulatory Visit (INDEPENDENT_AMBULATORY_CARE_PROVIDER_SITE_OTHER): Payer: Medicare Other | Admitting: Vascular Surgery

## 2018-11-02 ENCOUNTER — Ambulatory Visit (INDEPENDENT_AMBULATORY_CARE_PROVIDER_SITE_OTHER): Payer: Medicare Other

## 2018-11-02 ENCOUNTER — Encounter (INDEPENDENT_AMBULATORY_CARE_PROVIDER_SITE_OTHER): Payer: Self-pay | Admitting: Vascular Surgery

## 2018-11-02 ENCOUNTER — Other Ambulatory Visit: Payer: Self-pay

## 2018-11-02 VITALS — BP 156/65 | HR 79 | Resp 16 | Ht 73.0 in | Wt 222.2 lb

## 2018-11-02 DIAGNOSIS — L97519 Non-pressure chronic ulcer of other part of right foot with unspecified severity: Secondary | ICD-10-CM | POA: Diagnosis not present

## 2018-11-02 DIAGNOSIS — Z79899 Other long term (current) drug therapy: Secondary | ICD-10-CM

## 2018-11-02 DIAGNOSIS — I10 Essential (primary) hypertension: Secondary | ICD-10-CM

## 2018-11-02 DIAGNOSIS — Z87891 Personal history of nicotine dependence: Secondary | ICD-10-CM | POA: Diagnosis not present

## 2018-11-02 DIAGNOSIS — E785 Hyperlipidemia, unspecified: Secondary | ICD-10-CM | POA: Diagnosis not present

## 2018-11-02 DIAGNOSIS — I70235 Atherosclerosis of native arteries of right leg with ulceration of other part of foot: Secondary | ICD-10-CM | POA: Diagnosis not present

## 2018-11-02 DIAGNOSIS — Z9862 Peripheral vascular angioplasty status: Secondary | ICD-10-CM | POA: Diagnosis not present

## 2018-11-02 DIAGNOSIS — I739 Peripheral vascular disease, unspecified: Secondary | ICD-10-CM

## 2018-11-02 DIAGNOSIS — Z89619 Acquired absence of unspecified leg above knee: Secondary | ICD-10-CM | POA: Insufficient documentation

## 2018-11-02 DIAGNOSIS — Z89612 Acquired absence of left leg above knee: Secondary | ICD-10-CM | POA: Diagnosis not present

## 2018-11-02 DIAGNOSIS — Z89512 Acquired absence of left leg below knee: Secondary | ICD-10-CM

## 2018-11-02 NOTE — Assessment & Plan Note (Signed)
lipid control important in reducing the progression of atherosclerotic disease. Continue statin therapy  

## 2018-11-02 NOTE — Assessment & Plan Note (Addendum)
Left above-knee amputation.  Healed

## 2018-11-02 NOTE — Assessment & Plan Note (Signed)
Well-healed

## 2018-11-02 NOTE — Progress Notes (Signed)
MRN : [id number]  Timothy Juarez is a 68 y.o. (21-Dec-1950) male who presents with chief complaint of  Chief Complaint  Patient presents with  . Follow-up    [facility] abi  .  History of Present Illness: Patient returns today in follow up of PAD.  Last month, he went a repeat revascularization of the right lower extremity for ulceration.  His ulcer has basically healed and only a small scab remains.  His foot feels better and he is having much less pain.  No periprocedural complications.  His access site is well-healed.  His right ABI is up to 0.7 today and his waveforms are fairly strong monophasic waveforms.  He is status post left above-knee amputation previously.  Current Outpatient Medications  Medication Sig Dispense Refill  . acetaminophen (TYLENOL) 325 MG tablet Take 650 mg by mouth at bedtime.     [name] aspirin EC 81 MG tablet Take 1 tablet (81 mg total) by mouth daily. 150 tablet 2  . atorvastatin (LIPITOR) 10 MG tablet Take 1 tablet (10 mg total) by mouth daily. 30 tablet 11  . baclofen (LIORESAL) 10 MG tablet Take 10 mg by mouth 3 (three) times daily. Hold for sedation    . cyclobenzaprine (FLEXERIL) 5 MG tablet Take 5 mg by mouth at bedtime as needed for muscle spasms. Take 1.5 tabs =7.5mg  prn    . docusate sodium (COLACE) 100 MG capsule Take 100 mg by mouth daily as needed for mild constipation.    [name] ELIQUIS 5 MG TABS tablet Take 5 mg by mouth 2 (two) times daily.    . fentaNYL (DURAGESIC - DOSED MCG/HR) 12 MCG/HR Place 1 patch (12.5 mcg total) onto the skin every 3 (three) days. 5 patch 0  . ferrous sulfate 325 (65 FE) MG EC tablet Take 1 tablet (325 mg total) by mouth 3 (three) times daily with meals. (Patient taking differently: Take 325 mg by mouth daily with breakfast. ) 90 tablet 3  . furosemide (LASIX) 40 MG tablet Take 1 tablet (40 mg total) by mouth daily. 30 tablet   . gabapentin (NEURONTIN) 100 MG capsule Take 100 mg by mouth at bedtime.    [name] guaiFENesin  (ROBITUSSIN) 100 MG/5ML SOLN Take 15 mLs by mouth 3 (three) times daily as needed for cough or to loosen phlegm.    . Melatonin 2.5 MG CAPS Take 5 mg by mouth daily as needed.    . nitroGLYCERIN (NITROSTAT) 0.4 MG SL tablet Place 0.14 mg under the tongue every 5 (five) minutes as needed for chest pain.    . Omega-3 Fatty Acids (FISH OIL) 1000 MG CAPS Take 1,000 mg by mouth every morning.    [name] oxyCODONE 10 MG TABS Take 1 tablet (10 mg total) by mouth 3 (three) times daily as needed (moderate pain). 15 tablet 0  . oxymetazoline (AFRIN) 0.05 % nasal spray Place 2 sprays into both nostrils 2 (two) times daily as needed for congestion. For nose bleeds    . potassium chloride SA (K-DUR,KLOR-CON) 20 MEQ tablet Take 20 mEq by mouth daily. With or after meal    . sodium chloride (OCEAN) 0.65 % SOLN nasal spray Place 2 sprays into both nostrils 2 (two) times daily as needed for congestion.    . TRELEGY ELLIPTA 100-62.5-25 MCG/INH AEPB Inhale 1 puff into the lungs daily.      No current facility-administered medications for this visit.     Past Medical History:  Diagnosis Date  . Acute  embolism and thombos unsp deep vn unsp lower extremity ([place])   . Acute respiratory failure ([place])   . Allergy   . Anemia   . Aneurysm of unspecified site ([place])   . ARF (acute respiratory failure) ([place])    H/O  . Bronchitis   . CHF (congestive heart failure) ([place])   . COPD (chronic obstructive pulmonary disease) ([place])   . Cough   . Epistaxis   . GERD (gastroesophageal reflux disease)   . Gout   . Hyperlipidemia   . Hypertension   . Hypokalemia   . Insomnia   . Muscle contracture    MUSCLE SPASMS, muscle weakness  . Peripheral vascular disease ([place])   . Pneumonia   . Pressure ulcer     Past Surgical History:  Procedure Laterality Date  . AMPUTATION Left 09/19/2015   Procedure: AMPUTATION BELOW KNEE;  Surgeon: Algernon Huxley, MD;  Location: [facility] ORS;  Service: Vascular;  Laterality: Left;  . AMPUTATION Left  11/01/2015   Procedure: AMPUTATION ABOVE KNEE;  Surgeon: Algernon Huxley, MD;  Location: [facility] ORS;  Service: Vascular;  Laterality: Left;  . APPLICATION OF WOUND VAC Left 10/18/2015   Procedure: APPLICATION OF WOUND VAC;  Surgeon: Algernon Huxley, MD;  Location: [facility] OR[facility];  Service: Vascular;  Laterality: Left;  . COLONOSCOPY WITH PROPOFOL N/A 05/25/2017   Procedure: COLONOSCOPY WITH PROPOFOL;  Surgeon: Jonathon Bellows, MD;  Location: [facility] ENDOSCOPY;  Service: Gastroenterology;  Laterality: N/A;  . COLONOSCOPY WITH PROPOFOL N/A 10/14/2017   Procedure: COLONOSCOPY WITH PROPOFOL;  Surgeon: Jonathon Bellows, MD;  Location: [facility] ENDOSCOPY;  Service: Gastroenterology;  Laterality: N/A;  . ESOPHAGOGASTRODUODENOSCOPY (EGD) WITH PROPOFOL N/A 05/25/2017   Procedure: ESOPHAGOGASTRODUODENOSCOPY (EGD) WITH PROPOFOL;  Surgeon: Jonathon Bellows, MD;  Location: [facility] ENDOSCOPY;  Service: Gastroenterology;  Laterality: N/A;  . ESOPHAGOGASTRODUODENOSCOPY (EGD) WITH PROPOFOL N/A 10/14/2017   Procedure: ESOPHAGOGASTRODUODENOSCOPY (EGD) WITH PROPOFOL;  Surgeon: Jonathon Bellows, MD;  Location: [facility] ENDOSCOPY;  Service: Gastroenterology;  Laterality: N/A;  . GIVENS CAPSULE STUDY N/A 07/14/2017   Procedure: GIVENS CAPSULE STUDY;  Surgeon: Jonathon Bellows, MD;  Location: [facility] ENDOSCOPY;  Service: Gastroenterology;  Laterality: N/A;  . GIVENS CAPSULE STUDY N/A 10/30/2017   Procedure: GIVENS CAPSULE STUDY 12 HR;  Surgeon: Jonathon Bellows, MD;  Location: [facility] ENDOSCOPY;  Service: Gastroenterology;  Laterality: N/A;  . IVC FILTER INSERTION N/A 10/12/2017   Procedure: IVC FILTER INSERTION;  Surgeon: Algernon Huxley, MD;  Location: [place] CV LAB;  Service: Cardiovascular;  Laterality: N/A;  . LOWER EXTREMITY ANGIOGRAPHY Right 10/04/2018   Procedure: LOWER EXTREMITY ANGIOGRAPHY;  Surgeon: Algernon Huxley, MD;  Location: [place] CV LAB;  Service: Cardiovascular;  Laterality: Right;  . PERIPHERAL VASCULAR CATHETERIZATION Left 01/18/2015   Procedure: Lower Extremity  Angiography;  Surgeon: Algernon Huxley, MD;  Location: [place] CV LAB;  Service: Cardiovascular;  Laterality: Left;  . PERIPHERAL VASCULAR CATHETERIZATION N/A 01/18/2015   Procedure: Lower Extremity Intervention;  Surgeon: Algernon Huxley, MD;  Location: [place] CV LAB;  Service: Cardiovascular;  Laterality: N/A;  . PERIPHERAL VASCULAR CATHETERIZATION  07/30/2015   Procedure: Lower Extremity Intervention;  Surgeon: Algernon Huxley, MD;  Location: [place] CV LAB;  Service: Cardiovascular;;  . PERIPHERAL VASCULAR CATHETERIZATION N/A 07/30/2015   Procedure: Abdominal Aortogram w/Lower Extremity;  Surgeon: Algernon Huxley, MD;  Location: [place] CV LAB;  Service: Cardiovascular;  Laterality: N/A;  . PERIPHERAL VASCULAR CATHETERIZATION Left 08/22/2015   Procedure: Lower Extremity Angiography;  Surgeon: Erskine Squibb  Lucky Cowboy, MD;  Location: [place] CV LAB;  Service: Cardiovascular;  Laterality: Left;  . PERIPHERAL VASCULAR CATHETERIZATION Left 08/22/2015   Procedure: Lower Extremity Intervention;  Surgeon: Algernon Huxley, MD;  Location: [place] CV LAB;  Service: Cardiovascular;  Laterality: Left;  . PERIPHERAL VASCULAR CATHETERIZATION Right 03/31/2016   Procedure: Lower Extremity Angiography;  Surgeon: Algernon Huxley, MD;  Location: [place] CV LAB;  Service: Cardiovascular;  Laterality: Right;  . PERIPHERAL VASCULAR CATHETERIZATION  03/31/2016   Procedure: Lower Extremity Intervention;  Surgeon: Algernon Huxley, MD;  Location: [place] CV LAB;  Service: Cardiovascular;;  . PERIPHERAL VASCULAR CATHETERIZATION Left 04/10/2016   Procedure: Lower Extremity Angiography;  Surgeon: Algernon Huxley, MD;  Location: [place] CV LAB;  Service: Cardiovascular;  Laterality: Left;  . WOUND DEBRIDEMENT Left 10/18/2015   Procedure: DEBRIDEMENT WOUND   ( LEFT BKA DEBRIDEMENT );  Surgeon: Algernon Huxley, MD;  Location: [facility] ORS;  Service: Vascular;  Laterality: Left;    Social History Social History   Tobacco Use  .  Smoking status: Former Smoker    Packs/day: 1.00    Years: 44.00    Pack years: 44.00    Types: Cigarettes    Last attempt to quit: 11/17/2012    Years since quitting: 5.9  . Smokeless tobacco: Never Used  Substance Use Topics  . Alcohol use: No  . Drug use: No    Family History No bleeding or clotting disorders  No Known Allergies   REVIEW OF SYSTEMS (Negative unless checked)  Constitutional: [] Weight loss  [] Fever  [] Chills Cardiac: [] Chest pain   [] Chest pressure   [] Palpitations   [] Shortness of breath when laying flat   [] Shortness of breath at rest   [] Shortness of breath with exertion. Vascular:  [] Pain in legs with walking   [] Pain in legs at rest   [] Pain in legs when laying flat   [] Claudication   [] Pain in feet when walking  [] Pain in feet at rest  [] Pain in feet when laying flat   [] History of DVT   [] Phlebitis   [x] Swelling in legs   [] Varicose veins   [x] Non-healing ulcers Pulmonary:   [] Uses home oxygen   [] Productive cough   [] Hemoptysis   [] Wheeze  [] COPD   [] Asthma Neurologic:  [] Dizziness  [] Blackouts   [] Seizures   [] History of stroke   [] History of TIA  [] Aphasia   [] Temporary blindness   [] Dysphagia   [] Weakness or numbness in arms   [] Weakness or numbness in legs Musculoskeletal:  [x] Arthritis   [] Joint swelling   [x] Joint pain   [] Low back pain Hematologic:  [] Easy bruising  [] Easy bleeding   [] Hypercoagulable state   [] Anemic   Gastrointestinal:  [] Blood in stool   [] Vomiting blood  [] Gastroesophageal reflux/heartburn   [] Abdominal pain Genitourinary:  [] Chronic kidney disease   [] Difficult urination  [] Frequent urination  [] Burning with urination   [] Hematuria Skin:  [] Rashes   [x] Ulcers   [x] Wounds Psychological:  [] History of anxiety   []  History of major depression.  Physical Examination  BP (!) 156/65 (BP Location: Right Arm)   Pulse 79   Resp 16   Ht 6\' 1"  (1.854 m)   Wt 222 lb 3.2 oz (100.8 kg)   BMI 29.32 kg/m  Gen:  WD/WN, NAD Head: [place]/AT, No  temporalis wasting. Ear/Nose/Throat: Hearing grossly intact, nares w/o erythema or drainage Eyes: Conjunctiva clear. Sclera non-icteric Neck: Supple.  Trachea midline Pulmonary:  Good air movement, no use of accessory  muscles.  Cardiac: RRR, no JVD Vascular:  Vessel Right Left  Radial Palpable Palpable                          PT  1+ palpable  not palpable  DP  1+ palpable  not palpable    Musculoskeletal: M/S 5/5 throughout.  No deformity or atrophy.  Uses a wheelchair.  1+ right lower extremity edema. Neurologic: Sensation grossly intact in extremities.  Symmetrical.  Speech is fluent.  Psychiatric: Judgment intact, Mood & affect appropriate for pt's clinical situation. Dermatologic: No rashes or ulcers noted.  No cellulitis or open wounds.       Labs Recent Results (from the past 2160 hour(s))  BUN     Status: None   Collection Time: 10/01/18  8:24 AM  Result Value Ref Range   BUN 13 8 - 23 mg/dL    Comment: Performed at [facility], [address] [place], [place] [address]  Creatinine, serum     Status: None   Collection Time: 10/01/18  8:24 AM  Result Value Ref Range   Creatinine, Ser 1.21 0.61 - 1.24 mg/dL   GFR calc non Af Amer >60 >60 mL/min   GFR calc Af Amer >60 >60 mL/min    Comment: Performed at [facility], [address], [place], [place] [address]    Radiology No results found.  Assessment/Plan  Hyperlipidemia lipid control important in reducing the progression of atherosclerotic disease. Continue statin therapy   Hypertension blood pressure control important in reducing the progression of atherosclerotic disease. On appropriate oral medications.   Status post below-knee amputation (HCC) Left above-knee amputation.  Healed  Hx of AKA (above knee amputation), left ([place]) Well-healed.  PAD (peripheral artery disease) (HCC) His right ABI is up to 0.7 today and his waveforms are fairly strong monophasic waveforms. At  this point, symptomatically he is doing much better.  Recheck in 3 to 4 months with noninvasive studies.    [name], MD  11/02/2018 2:14 PM    This note was created with Dragon medical transcription system.  Any errors from dictation are purely unintentional

## 2018-11-02 NOTE — Assessment & Plan Note (Signed)
His right ABI is up to 0.7 today and his waveforms are fairly strong monophasic waveforms. At this point, symptomatically he is doing much better.  Recheck in 3 to 4 months with noninvasive studies.

## 2018-11-02 NOTE — Assessment & Plan Note (Signed)
blood pressure control important in reducing the progression of atherosclerotic disease. On appropriate oral medications.  

## 2018-11-25 DIAGNOSIS — M62838 Other muscle spasm: Secondary | ICD-10-CM | POA: Diagnosis not present

## 2018-11-25 DIAGNOSIS — E7849 Other hyperlipidemia: Secondary | ICD-10-CM | POA: Diagnosis not present

## 2018-11-25 DIAGNOSIS — I7389 Other specified peripheral vascular diseases: Secondary | ICD-10-CM | POA: Diagnosis not present

## 2018-11-25 DIAGNOSIS — I82512 Chronic embolism and thrombosis of left femoral vein: Secondary | ICD-10-CM | POA: Diagnosis not present

## 2018-11-25 DIAGNOSIS — D508 Other iron deficiency anemias: Secondary | ICD-10-CM | POA: Diagnosis not present

## 2018-11-25 DIAGNOSIS — Z89612 Acquired absence of left leg above knee: Secondary | ICD-10-CM | POA: Diagnosis not present

## 2018-11-25 DIAGNOSIS — I5189 Other ill-defined heart diseases: Secondary | ICD-10-CM | POA: Diagnosis not present

## 2018-11-25 DIAGNOSIS — G478 Other sleep disorders: Secondary | ICD-10-CM | POA: Diagnosis not present

## 2018-11-25 DIAGNOSIS — J984 Other disorders of lung: Secondary | ICD-10-CM | POA: Diagnosis not present

## 2018-11-25 DIAGNOSIS — E538 Deficiency of other specified B group vitamins: Secondary | ICD-10-CM | POA: Diagnosis not present

## 2018-11-25 DIAGNOSIS — I10 Essential (primary) hypertension: Secondary | ICD-10-CM | POA: Diagnosis not present

## 2018-12-03 DIAGNOSIS — L84 Corns and callosities: Secondary | ICD-10-CM | POA: Diagnosis not present

## 2018-12-03 DIAGNOSIS — B351 Tinea unguium: Secondary | ICD-10-CM | POA: Diagnosis not present

## 2018-12-03 DIAGNOSIS — I739 Peripheral vascular disease, unspecified: Secondary | ICD-10-CM | POA: Diagnosis not present

## 2018-12-03 DIAGNOSIS — Z89421 Acquired absence of other right toe(s): Secondary | ICD-10-CM | POA: Diagnosis not present

## 2018-12-17 DIAGNOSIS — Z7189 Other specified counseling: Secondary | ICD-10-CM | POA: Diagnosis not present

## 2018-12-17 DIAGNOSIS — U071 COVID-19: Secondary | ICD-10-CM | POA: Diagnosis not present

## 2018-12-21 DIAGNOSIS — U071 COVID-19: Secondary | ICD-10-CM | POA: Diagnosis not present

## 2018-12-21 DIAGNOSIS — R630 Anorexia: Secondary | ICD-10-CM | POA: Diagnosis not present

## 2018-12-21 DIAGNOSIS — R5081 Fever presenting with conditions classified elsewhere: Secondary | ICD-10-CM | POA: Diagnosis not present

## 2018-12-22 DIAGNOSIS — G4709 Other insomnia: Secondary | ICD-10-CM | POA: Diagnosis not present

## 2018-12-22 DIAGNOSIS — R5081 Fever presenting with conditions classified elsewhere: Secondary | ICD-10-CM | POA: Diagnosis not present

## 2018-12-22 DIAGNOSIS — R630 Anorexia: Secondary | ICD-10-CM | POA: Diagnosis not present

## 2018-12-22 DIAGNOSIS — U071 COVID-19: Secondary | ICD-10-CM | POA: Diagnosis not present

## 2018-12-22 DIAGNOSIS — R198 Other specified symptoms and signs involving the digestive system and abdomen: Secondary | ICD-10-CM | POA: Diagnosis not present

## 2018-12-23 DIAGNOSIS — U071 COVID-19: Secondary | ICD-10-CM | POA: Diagnosis not present

## 2018-12-23 DIAGNOSIS — R198 Other specified symptoms and signs involving the digestive system and abdomen: Secondary | ICD-10-CM | POA: Diagnosis not present

## 2018-12-23 DIAGNOSIS — R630 Anorexia: Secondary | ICD-10-CM | POA: Diagnosis not present

## 2018-12-24 DIAGNOSIS — R0902 Hypoxemia: Secondary | ICD-10-CM | POA: Diagnosis not present

## 2018-12-24 DIAGNOSIS — J984 Other disorders of lung: Secondary | ICD-10-CM | POA: Diagnosis not present

## 2018-12-24 DIAGNOSIS — U071 COVID-19: Secondary | ICD-10-CM | POA: Diagnosis not present

## 2018-12-27 ENCOUNTER — Inpatient Hospital Stay (HOSPITAL_COMMUNITY)
Admission: AD | Admit: 2018-12-27 | Discharge: 2019-01-04 | DRG: 871 | Disposition: A | Discharge status: Skilled Nursing Facility | Payer: Medicare Other | Source: Skilled Nursing Facility | Attending: Family Medicine | Admitting: Family Medicine

## 2018-12-27 ENCOUNTER — Encounter (HOSPITAL_COMMUNITY): Payer: Self-pay | Admitting: Internal Medicine

## 2018-12-27 ENCOUNTER — Other Ambulatory Visit: Payer: Self-pay

## 2018-12-27 ENCOUNTER — Emergency Department
Admission: EM | Admit: 2018-12-27 | Discharge: 2018-12-27 | Disposition: A | Discharge status: Other Acute Inpatient Hospital | Payer: Medicare Other | Attending: Emergency Medicine | Admitting: Emergency Medicine

## 2018-12-27 ENCOUNTER — Emergency Department: Payer: Medicare Other

## 2018-12-27 DIAGNOSIS — I739 Peripheral vascular disease, unspecified: Secondary | ICD-10-CM | POA: Diagnosis present

## 2018-12-27 DIAGNOSIS — J449 Chronic obstructive pulmonary disease, unspecified: Secondary | ICD-10-CM | POA: Diagnosis not present

## 2018-12-27 DIAGNOSIS — J9601 Acute respiratory failure with hypoxia: Secondary | ICD-10-CM | POA: Diagnosis not present

## 2018-12-27 DIAGNOSIS — J440 Chronic obstructive pulmonary disease with acute lower respiratory infection: Secondary | ICD-10-CM | POA: Diagnosis present

## 2018-12-27 DIAGNOSIS — J181 Lobar pneumonia, unspecified organism: Secondary | ICD-10-CM

## 2018-12-27 DIAGNOSIS — R6520 Severe sepsis without septic shock: Secondary | ICD-10-CM | POA: Diagnosis not present

## 2018-12-27 DIAGNOSIS — G929 Unspecified toxic encephalopathy: Secondary | ICD-10-CM

## 2018-12-27 DIAGNOSIS — J9621 Acute and chronic respiratory failure with hypoxia: Secondary | ICD-10-CM

## 2018-12-27 DIAGNOSIS — Z87891 Personal history of nicotine dependence: Secondary | ICD-10-CM | POA: Insufficient documentation

## 2018-12-27 DIAGNOSIS — N320 Bladder-neck obstruction: Secondary | ICD-10-CM | POA: Diagnosis not present

## 2018-12-27 DIAGNOSIS — I5032 Chronic diastolic (congestive) heart failure: Secondary | ICD-10-CM | POA: Diagnosis not present

## 2018-12-27 DIAGNOSIS — N179 Acute kidney failure, unspecified: Secondary | ICD-10-CM | POA: Diagnosis not present

## 2018-12-27 DIAGNOSIS — Z79899 Other long term (current) drug therapy: Secondary | ICD-10-CM | POA: Diagnosis not present

## 2018-12-27 DIAGNOSIS — I70229 Atherosclerosis of native arteries of extremities with rest pain, unspecified extremity: Secondary | ICD-10-CM | POA: Diagnosis present

## 2018-12-27 DIAGNOSIS — E872 Acidosis: Secondary | ICD-10-CM

## 2018-12-27 DIAGNOSIS — G92 Toxic encephalopathy: Secondary | ICD-10-CM | POA: Diagnosis not present

## 2018-12-27 DIAGNOSIS — Z7982 Long term (current) use of aspirin: Secondary | ICD-10-CM

## 2018-12-27 DIAGNOSIS — E860 Dehydration: Secondary | ICD-10-CM | POA: Diagnosis present

## 2018-12-27 DIAGNOSIS — R339 Retention of urine, unspecified: Secondary | ICD-10-CM | POA: Diagnosis not present

## 2018-12-27 DIAGNOSIS — I82502 Chronic embolism and thrombosis of unspecified deep veins of left lower extremity: Secondary | ICD-10-CM | POA: Diagnosis present

## 2018-12-27 DIAGNOSIS — K7200 Acute and subacute hepatic failure without coma: Secondary | ICD-10-CM | POA: Diagnosis not present

## 2018-12-27 DIAGNOSIS — L89152 Pressure ulcer of sacral region, stage 2: Secondary | ICD-10-CM | POA: Diagnosis not present

## 2018-12-27 DIAGNOSIS — R0902 Hypoxemia: Secondary | ICD-10-CM | POA: Diagnosis not present

## 2018-12-27 DIAGNOSIS — I10 Essential (primary) hypertension: Secondary | ICD-10-CM | POA: Diagnosis not present

## 2018-12-27 DIAGNOSIS — A4189 Other specified sepsis: Secondary | ICD-10-CM | POA: Diagnosis not present

## 2018-12-27 DIAGNOSIS — I82512 Chronic embolism and thrombosis of left femoral vein: Secondary | ICD-10-CM

## 2018-12-27 DIAGNOSIS — R069 Unspecified abnormalities of breathing: Secondary | ICD-10-CM | POA: Diagnosis not present

## 2018-12-27 DIAGNOSIS — R404 Transient alteration of awareness: Secondary | ICD-10-CM | POA: Diagnosis not present

## 2018-12-27 DIAGNOSIS — I110 Hypertensive heart disease with heart failure: Secondary | ICD-10-CM | POA: Diagnosis present

## 2018-12-27 DIAGNOSIS — A419 Sepsis, unspecified organism: Secondary | ICD-10-CM

## 2018-12-27 DIAGNOSIS — E861 Hypovolemia: Secondary | ICD-10-CM | POA: Diagnosis present

## 2018-12-27 DIAGNOSIS — I509 Heart failure, unspecified: Secondary | ICD-10-CM | POA: Diagnosis not present

## 2018-12-27 DIAGNOSIS — E8729 Other acidosis: Secondary | ICD-10-CM

## 2018-12-27 DIAGNOSIS — Z89612 Acquired absence of left leg above knee: Secondary | ICD-10-CM

## 2018-12-27 DIAGNOSIS — E875 Hyperkalemia: Secondary | ICD-10-CM | POA: Diagnosis present

## 2018-12-27 DIAGNOSIS — Z7901 Long term (current) use of anticoagulants: Secondary | ICD-10-CM | POA: Insufficient documentation

## 2018-12-27 DIAGNOSIS — J1289 Other viral pneumonia: Secondary | ICD-10-CM | POA: Diagnosis not present

## 2018-12-27 DIAGNOSIS — Z6837 Body mass index (BMI) 37.0-37.9, adult: Secondary | ICD-10-CM

## 2018-12-27 DIAGNOSIS — R0689 Other abnormalities of breathing: Secondary | ICD-10-CM | POA: Diagnosis not present

## 2018-12-27 DIAGNOSIS — R4182 Altered mental status, unspecified: Secondary | ICD-10-CM | POA: Diagnosis not present

## 2018-12-27 DIAGNOSIS — J22 Unspecified acute lower respiratory infection: Secondary | ICD-10-CM | POA: Diagnosis not present

## 2018-12-27 DIAGNOSIS — U071 COVID-19: Secondary | ICD-10-CM

## 2018-12-27 DIAGNOSIS — L89153 Pressure ulcer of sacral region, stage 3: Secondary | ICD-10-CM | POA: Diagnosis present

## 2018-12-27 DIAGNOSIS — Z7951 Long term (current) use of inhaled steroids: Secondary | ICD-10-CM

## 2018-12-27 DIAGNOSIS — E876 Hypokalemia: Secondary | ICD-10-CM | POA: Diagnosis not present

## 2018-12-27 DIAGNOSIS — J189 Pneumonia, unspecified organism: Secondary | ICD-10-CM

## 2018-12-27 DIAGNOSIS — K219 Gastro-esophageal reflux disease without esophagitis: Secondary | ICD-10-CM | POA: Diagnosis present

## 2018-12-27 DIAGNOSIS — J069 Acute upper respiratory infection, unspecified: Secondary | ICD-10-CM

## 2018-12-27 DIAGNOSIS — Z89619 Acquired absence of unspecified leg above knee: Secondary | ICD-10-CM

## 2018-12-27 DIAGNOSIS — D649 Anemia, unspecified: Secondary | ICD-10-CM | POA: Diagnosis present

## 2018-12-27 DIAGNOSIS — L8990 Pressure ulcer of unspecified site, unspecified stage: Secondary | ICD-10-CM | POA: Diagnosis present

## 2018-12-27 HISTORY — DX: Acute kidney failure, unspecified: N17.9

## 2018-12-27 HISTORY — DX: Acidosis: E87.2

## 2018-12-27 HISTORY — DX: Sepsis, unspecified organism: R65.20

## 2018-12-27 HISTORY — DX: Unspecified toxic encephalopathy: G92.9

## 2018-12-27 HISTORY — DX: Other acidosis: E87.29

## 2018-12-27 HISTORY — DX: Sepsis, unspecified organism: A41.9

## 2018-12-27 LAB — CBC WITH DIFFERENTIAL/PLATELET
Abs Immature Granulocytes: 0.71 10*3/uL — ABNORMAL HIGH (ref 0.00–0.07)
Basophils Absolute: 0.1 10*3/uL (ref 0.0–0.1)
Basophils Relative: 1 %
Eosinophils Absolute: 0 10*3/uL (ref 0.0–0.5)
Eosinophils Relative: 0 %
HCT: 42 % (ref 39.0–52.0)
Hemoglobin: 13.8 g/dL (ref 13.0–17.0)
Immature Granulocytes: 5 %
Lymphocytes Relative: 10 %
Lymphs Abs: 1.5 10*3/uL (ref 0.7–4.0)
MCH: 26.7 pg (ref 26.0–34.0)
MCHC: 32.9 g/dL (ref 30.0–36.0)
MCV: 81.4 fL (ref 80.0–100.0)
Monocytes Absolute: 0.7 10*3/uL (ref 0.1–1.0)
Monocytes Relative: 5 %
Neutro Abs: 12 10*3/uL — ABNORMAL HIGH (ref 1.7–7.7)
Neutrophils Relative %: 79 %
Platelets: 314 10*3/uL (ref 150–400)
RBC: 5.16 MIL/uL (ref 4.22–5.81)
RDW: 17.5 % — ABNORMAL HIGH (ref 11.5–15.5)
Smear Review: NORMAL
WBC: 15 10*3/uL — ABNORMAL HIGH (ref 4.0–10.5)
nRBC: 0.2 % (ref 0.0–0.2)

## 2018-12-27 LAB — FIBRIN DERIVATIVES D-DIMER (ARMC ONLY): Fibrin derivatives D-dimer (ARMC): 3654.48 ng/mL (FEU) — ABNORMAL HIGH (ref 0.00–499.00)

## 2018-12-27 LAB — COMPREHENSIVE METABOLIC PANEL
ALT: 24 U/L (ref 0–44)
AST: 51 U/L — ABNORMAL HIGH (ref 15–41)
Albumin: 2.8 g/dL — ABNORMAL LOW (ref 3.5–5.0)
Alkaline Phosphatase: 75 U/L (ref 38–126)
Anion gap: 14 (ref 5–15)
BUN: 45 mg/dL — ABNORMAL HIGH (ref 8–23)
CO2: 17 mmol/L — ABNORMAL LOW (ref 22–32)
Calcium: 8.2 mg/dL — ABNORMAL LOW (ref 8.9–10.3)
Chloride: 109 mmol/L (ref 98–111)
Creatinine, Ser: 2.88 mg/dL — ABNORMAL HIGH (ref 0.61–1.24)
GFR calc Af Amer: 25 mL/min — ABNORMAL LOW (ref >60)
GFR calc non Af Amer: 21 mL/min — ABNORMAL LOW (ref >60)
Glucose, Bld: 136 mg/dL — ABNORMAL HIGH (ref 70–99)
Potassium: 5.2 mmol/L — ABNORMAL HIGH (ref 3.5–5.1)
Sodium: 140 mmol/L (ref 135–145)
Total Bilirubin: 1.9 mg/dL — ABNORMAL HIGH (ref 0.3–1.2)
Total Protein: 8.2 g/dL — ABNORMAL HIGH (ref 6.5–8.1)

## 2018-12-27 LAB — URINALYSIS, COMPLETE (UACMP) WITH MICROSCOPIC
Bacteria, UA: NONE SEEN
Bilirubin Urine: NEGATIVE
Glucose, UA: NEGATIVE mg/dL
Ketones, ur: NEGATIVE mg/dL
Leukocytes,Ua: NEGATIVE
Nitrite: NEGATIVE
Protein, ur: 30 mg/dL — AB
Specific Gravity, Urine: 1.016 (ref 1.005–1.030)
pH: 5 (ref 5.0–8.0)

## 2018-12-27 LAB — SARS CORONAVIRUS 2 BY RT PCR (HOSPITAL ORDER, PERFORMED IN ~~LOC~~ HOSPITAL LAB): SARS Coronavirus 2: POSITIVE — AB

## 2018-12-27 LAB — POCT I-STAT 7, (LYTES, BLD GAS, ICA,H+H)
Acid-base deficit: 6 mmol/L — ABNORMAL HIGH (ref 0.0–2.0)
Bicarbonate: 17.8 mmol/L — ABNORMAL LOW (ref 20.0–28.0)
Calcium, Ion: 1.17 mmol/L (ref 1.15–1.40)
HCT: 40 % (ref 39.0–52.0)
Hemoglobin: 13.6 g/dL (ref 13.0–17.0)
O2 Saturation: 79 %
Patient temperature: 98
Potassium: 5.1 mmol/L (ref 3.5–5.1)
Sodium: 142 mmol/L (ref 135–145)
TCO2: 19 mmol/L — ABNORMAL LOW (ref 22–32)
pCO2 arterial: 28.7 mmHg — ABNORMAL LOW (ref 32.0–48.0)
pH, Arterial: 7.4 (ref 7.350–7.450)
pO2, Arterial: 42 mmHg — ABNORMAL LOW (ref 83.0–108.0)

## 2018-12-27 LAB — LACTATE DEHYDROGENASE: LDH: 411 U/L — ABNORMAL HIGH (ref 98–192)

## 2018-12-27 LAB — C-REACTIVE PROTEIN: CRP: 15.5 mg/dL — ABNORMAL HIGH (ref <1.0)

## 2018-12-27 LAB — FERRITIN: Ferritin: 416 ng/mL — ABNORMAL HIGH (ref 24–336)

## 2018-12-27 LAB — TRIGLYCERIDES: Triglycerides: 195 mg/dL — ABNORMAL HIGH (ref <150)

## 2018-12-27 LAB — FIBRINOGEN: Fibrinogen: 700 mg/dL — ABNORMAL HIGH (ref 210–475)

## 2018-12-27 LAB — LACTIC ACID, PLASMA: Lactic Acid, Venous: 2.5 mmol/L (ref 0.5–1.9)

## 2018-12-27 LAB — PROCALCITONIN: Procalcitonin: 0.75 ng/mL

## 2018-12-27 MED ORDER — ACETAMINOPHEN 325 MG PO TABS
650.0000 mg | ORAL_TABLET | ORAL | Status: DC | PRN
  Filled 2018-12-27: qty 2

## 2018-12-27 MED ORDER — SODIUM CHLORIDE 0.9 % IV BOLUS
1000.0000 mL | Freq: Once | INTRAVENOUS | Status: AC
  Administered 2018-12-27: 1000 mL via INTRAVENOUS

## 2018-12-27 MED ORDER — ONDANSETRON HCL 4 MG/2ML IJ SOLN: 4.0000 mg | Freq: Four times a day (QID) | INTRAMUSCULAR | Status: DC | PRN

## 2018-12-27 MED ORDER — ONDANSETRON HCL 4 MG PO TABS: 4.0000 mg | ORAL_TABLET | Freq: Four times a day (QID) | ORAL | Status: DC | PRN

## 2018-12-27 MED ORDER — SODIUM CHLORIDE 0.9 % IV SOLN
1000.0000 mL | Freq: Once | INTRAVENOUS | Status: AC
  Administered 2018-12-27: 1000 mL via INTRAVENOUS

## 2018-12-27 MED ORDER — SODIUM CHLORIDE 0.9 % IV SOLN
1.0000 g | INTRAVENOUS | Status: AC
  Administered 2018-12-28 – 2018-12-31 (×4): 1 g via INTRAVENOUS
  Filled 2018-12-27 (×4): qty 10

## 2018-12-27 MED ORDER — ASPIRIN 300 MG RE SUPP
150.0000 mg | Freq: Every day | RECTAL | Status: DC
  Administered 2018-12-27 – 2018-12-28 (×2): 150 mg via RECTAL
  Filled 2018-12-27 (×3): qty 1

## 2018-12-27 MED ORDER — CHLORHEXIDINE GLUCONATE 0.12 % MT SOLN
15.0000 mL | Freq: Two times a day (BID) | OROMUCOSAL | Status: DC
  Administered 2018-12-27 – 2019-01-04 (×15): 15 mL via OROMUCOSAL
  Filled 2018-12-27 (×15): qty 15

## 2018-12-27 MED ORDER — ASPIRIN EC 81 MG PO TBEC: 81.0000 mg | DELAYED_RELEASE_TABLET | Freq: Every day | ORAL | Status: DC

## 2018-12-27 MED ORDER — APIXABAN 5 MG PO TABS
5.0000 mg | ORAL_TABLET | Freq: Two times a day (BID) | ORAL | Status: DC
  Administered 2018-12-27 – 2019-01-04 (×15): 5 mg via ORAL
  Filled 2018-12-27 (×16): qty 1

## 2018-12-27 MED ORDER — METHYLPREDNISOLONE SODIUM SUCC 125 MG IJ SOLR
60.0000 mg | Freq: Four times a day (QID) | INTRAMUSCULAR | Status: DC
  Administered 2018-12-27 – 2018-12-28 (×4): 60 mg via INTRAVENOUS
  Filled 2018-12-27 (×4): qty 2

## 2018-12-27 MED ORDER — ATORVASTATIN CALCIUM 10 MG PO TABS
10.0000 mg | ORAL_TABLET | Freq: Every day | ORAL | Status: DC
  Administered 2018-12-29 – 2019-01-04 (×7): 10 mg via ORAL
  Filled 2018-12-27 (×8): qty 1

## 2018-12-27 MED ORDER — POLYETHYLENE GLYCOL 3350 17 G PO PACK
17.0000 g | PACK | Freq: Every day | ORAL | Status: DC | PRN
  Administered 2018-12-30: 17 g via ORAL
  Filled 2018-12-27: qty 1

## 2018-12-27 MED ORDER — SODIUM CHLORIDE 0.9 % IV SOLN
500.0000 mg | Freq: Once | INTRAVENOUS | Status: AC
  Administered 2018-12-27: 500 mg via INTRAVENOUS
  Filled 2018-12-27: qty 500

## 2018-12-27 MED ORDER — FUROSEMIDE 20 MG PO TABS
40.0000 mg | ORAL_TABLET | Freq: Every day | ORAL | Status: DC
  Filled 2018-12-27: qty 2

## 2018-12-27 MED ORDER — SODIUM CHLORIDE 0.9 % IV SOLN
1.0000 g | Freq: Once | INTRAVENOUS | Status: AC
  Administered 2018-12-27: 1 g via INTRAVENOUS
  Filled 2018-12-27: qty 10

## 2018-12-27 MED ORDER — LORAZEPAM 0.5 MG PO TABS
0.5000 mg | ORAL_TABLET | Freq: Every day | ORAL | Status: DC
  Administered 2018-12-27: 0.5 mg via ORAL
  Filled 2018-12-27: qty 1

## 2018-12-27 MED ORDER — SODIUM CHLORIDE 0.9 % IV SOLN
INTRAVENOUS | Status: DC | PRN
  Administered 2018-12-27: 250 mL via INTRAVENOUS

## 2018-12-27 MED ORDER — TOCILIZUMAB 400 MG/20ML IV SOLN
800.0000 mg | Freq: Once | INTRAVENOUS | Status: AC
  Administered 2018-12-27: 800 mg via INTRAVENOUS
  Filled 2018-12-27: qty 40

## 2018-12-27 MED ORDER — DOCUSATE SODIUM 100 MG PO CAPS
100.0000 mg | ORAL_CAPSULE | Freq: Every day | ORAL | Status: DC | PRN
  Administered 2018-12-30: 100 mg via ORAL
  Filled 2018-12-27: qty 1

## 2018-12-27 MED ORDER — SODIUM CHLORIDE 0.9 % IV SOLN
INTRAVENOUS | Status: DC
  Administered 2018-12-27 – 2018-12-28 (×3): via INTRAVENOUS

## 2018-12-27 MED ORDER — AZITHROMYCIN 250 MG PO TABS
500.0000 mg | ORAL_TABLET | ORAL | Status: DC
  Filled 2018-12-27: qty 2

## 2018-12-27 MED ORDER — ORAL CARE MOUTH RINSE
15.0000 mL | Freq: Two times a day (BID) | OROMUCOSAL | Status: DC
  Administered 2018-12-27 – 2019-01-04 (×11): 15 mL via OROMUCOSAL

## 2018-12-27 NOTE — ED Triage Notes (Signed)
Pt here via EMS from [facility] for AMS and difficulty breathing. Per EMS pt is covid positive. Pt placed on non rebreather at 10L, O2 sat 94%. Pt opens his eyes to voice but does not follow commands.

## 2018-12-27 NOTE — ED Notes (Signed)
Condom catheter applied, pt tolerated well

## 2018-12-27 NOTE — Progress Notes (Signed)
Dr [place] Pink paged and notified of ABG results. Will continue to monitor patient.

## 2018-12-27 NOTE — Progress Notes (Addendum)
Pt was admitted with a mideline left arm that is coiled and coming out. notifed MD requesting to remove. PT is also to lethargic to swallow PO meds. Made MD aware. 1640 not urine output. New orders. Bladder scan 348ml. Will continue to monitor

## 2018-12-27 NOTE — Progress Notes (Signed)
ABG reported to RN.  RN increased to lpm HFNC. spo2 currently 92%

## 2018-12-27 NOTE — ED Provider Notes (Signed)
[facility] Emergency Department Provider Note   ____________________________________________    I have reviewed the triage vital signs and the nursing notes.   HISTORY  Chief Complaint Altered Mental Status     HPI Timothy Juarez is a 68 y.o. male with history of peripheral arterial disease, CHF, COPD, left AKA with reported positive COVID-19 test from [facility] who presents with respiratory distress.  Reportedly also with altered mental status.  Past Medical History:  Diagnosis Date  . Acute embolism and thombos unsp deep vn unsp lower extremity ([place])   . Acute respiratory failure ([place])   . Allergy   . Anemia   . Aneurysm of unspecified site ([place])   . ARF (acute respiratory failure) ([place])    H/O  . Bronchitis   . CHF (congestive heart failure) ([place])   . COPD (chronic obstructive pulmonary disease) ([place])   . Cough   . Epistaxis   . GERD (gastroesophageal reflux disease)   . Gout   . Hyperlipidemia   . Hypertension   . Hypokalemia   . Insomnia   . Muscle contracture    MUSCLE SPASMS, muscle weakness  . Peripheral vascular disease ([place])   . Pneumonia   . Pressure ulcer     Patient Active Problem List   Diagnosis Date Noted  . Hx of AKA (above knee amputation), left ([place]) 11/02/2018  . Symptomatic anemia 10/10/2017  . Gastrointestinal hemorrhage   . Chronic deep vein thrombosis (DVT) of left lower extremity ([place])   . Iron deficiency anemia 04/21/2017  . Hypertension 03/13/2017  . Recurrent deep vein thrombosis (DVT) ([place]) 03/11/2017  . Hyperlipidemia 11/04/2016  . Pressure ulcer 04/11/2016  . Pseudoaneurysm of left femoral artery ([place]) 04/11/2016  . PAD (peripheral artery disease) ([place]) 04/10/2016  . Atherosclerosis of native arteries of extremities with gangrene, left leg ([place]) 11/01/2015  . Ischemia of lower extremity 09/14/2015  . Atherosclerotic peripheral vascular disease with ulceration ([place]) 01/18/2015    Past  Surgical History:  Procedure Laterality Date  . AMPUTATION Left 09/19/2015   Procedure: AMPUTATION BELOW KNEE;  Surgeon: Algernon Huxley, MD;  Location: [facility] ORS;  Service: Vascular;  Laterality: Left;  . AMPUTATION Left 11/01/2015   Procedure: AMPUTATION ABOVE KNEE;  Surgeon: Algernon Huxley, MD;  Location: [facility] ORS;  Service: Vascular;  Laterality: Left;  . APPLICATION OF WOUND VAC Left 10/18/2015   Procedure: APPLICATION OF WOUND VAC;  Surgeon: Algernon Huxley, MD;  Location: [facility] ORS;  Service: Vascular;  Laterality: Left;  . COLONOSCOPY WITH PROPOFOL N/A 05/25/2017   Procedure: COLONOSCOPY WITH PROPOFOL;  Surgeon: Jonathon Bellows, MD;  Location: [facility] ENDOSCOPY;  Service: Gastroenterology;  Laterality: N/A;  . COLONOSCOPY WITH PROPOFOL N/A 10/14/2017   Procedure: COLONOSCOPY WITH PROPOFOL;  Surgeon: Jonathon Bellows, MD;  Location: [facility] ENDOSCOPY;  Service: Gastroenterology;  Laterality: N/A;  . ESOPHAGOGASTRODUODENOSCOPY (EGD) WITH PROPOFOL N/A 05/25/2017   Procedure: ESOPHAGOGASTRODUODENOSCOPY (EGD) WITH PROPOFOL;  Surgeon: Jonathon Bellows, MD;  Location: [facility] ENDOSCOPY;  Service: Gastroenterology;  Laterality: N/A;  . ESOPHAGOGASTRODUODENOSCOPY (EGD) WITH PROPOFOL N/A 10/14/2017   Procedure: ESOPHAGOGASTRODUODENOSCOPY (EGD) WITH PROPOFOL;  Surgeon: Jonathon Bellows, MD;  Location: [facility] ENDOSCOPY;  Service: Gastroenterology;  Laterality: N/A;  . GIVENS CAPSULE STUDY N/A 07/14/2017   Procedure: GIVENS CAPSULE STUDY;  Surgeon: Jonathon Bellows, MD;  Location: [facility] ENDOSCOPY;  Service: Gastroenterology;  Laterality: N/A;  . GIVENS CAPSULE STUDY N/A 10/30/2017   Procedure: GIVENS CAPSULE STUDY 12 HR;  Surgeon: Jonathon Bellows, MD;  Location: [facility] ENDOSCOPY;  Service: Gastroenterology;  Laterality: N/A;  . IVC FILTER INSERTION N/A 10/12/2017   Procedure: IVC FILTER INSERTION;  Surgeon: Algernon Huxley, MD;  Location: [place] CV LAB;  Service: Cardiovascular;  Laterality: N/A;  . LOWER EXTREMITY ANGIOGRAPHY Right 10/04/2018   Procedure:  LOWER EXTREMITY ANGIOGRAPHY;  Surgeon: Algernon Huxley, MD;  Location: [place] CV LAB;  Service: Cardiovascular;  Laterality: Right;  . PERIPHERAL VASCULAR CATHETERIZATION Left 01/18/2015   Procedure: Lower Extremity Angiography;  Surgeon: Algernon Huxley, MD;  Location: [place] CV LAB;  Service: Cardiovascular;  Laterality: Left;  . PERIPHERAL VASCULAR CATHETERIZATION N/A 01/18/2015   Procedure: Lower Extremity Intervention;  Surgeon: Algernon Huxley, MD;  Location: [place] CV LAB;  Service: Cardiovascular;  Laterality: N/A;  . PERIPHERAL VASCULAR CATHETERIZATION  07/30/2015   Procedure: Lower Extremity Intervention;  Surgeon: Algernon Huxley, MD;  Location: [place] CV LAB;  Service: Cardiovascular;;  . PERIPHERAL VASCULAR CATHETERIZATION N/A 07/30/2015   Procedure: Abdominal Aortogram w/Lower Extremity;  Surgeon: Algernon Huxley, MD;  Location: [employer] CV LAB;  Service: Cardiovascular;  Laterality: N/A;  . PERIPHERAL VASCULAR CATHETERIZATION Left 08/22/2015   Procedure: Lower Extremity Angiography;  Surgeon: Algernon Huxley, MD;  Location: [place] CV LAB;  Service: Cardiovascular;  Laterality: Left;  . PERIPHERAL VASCULAR CATHETERIZATION Left 08/22/2015   Procedure: Lower Extremity Intervention;  Surgeon: Algernon Huxley, MD;  Location: [place] CV LAB;  Service: Cardiovascular;  Laterality: Left;  . PERIPHERAL VASCULAR CATHETERIZATION Right 03/31/2016   Procedure: Lower Extremity Angiography;  Surgeon: Algernon Huxley, MD;  Location: [place] CV LAB;  Service: Cardiovascular;  Laterality: Right;  . PERIPHERAL VASCULAR CATHETERIZATION  03/31/2016   Procedure: Lower Extremity Intervention;  Surgeon: Algernon Huxley, MD;  Location: [place] CV LAB;  Service: Cardiovascular;;  . PERIPHERAL VASCULAR CATHETERIZATION Left 04/10/2016   Procedure: Lower Extremity Angiography;  Surgeon: Algernon Huxley, MD;  Location: [place] CV LAB;  Service: Cardiovascular;  Laterality: Left;  . WOUND  DEBRIDEMENT Left 10/18/2015   Procedure: DEBRIDEMENT WOUND   ( LEFT BKA DEBRIDEMENT );  Surgeon: Algernon Huxley, MD;  Location: [facility] ORS;  Service: Vascular;  Laterality: Left;    Prior to Admission medications   Medication Sig Start Date End Date Taking? Authorizing Provider  acetaminophen (TYLENOL) 325 MG tablet Take 650 mg by mouth at bedtime.     [provider]  aspirin EC 81 MG tablet Take 1 tablet (81 mg total) by mouth daily. 10/04/18   Algernon Huxley, MD  atorvastatin (LIPITOR) 10 MG tablet Take 1 tablet (10 mg total) by mouth daily. 10/04/18 10/04/19  Algernon Huxley, MD  baclofen (LIORESAL) 10 MG tablet Take 10 mg by mouth 3 (three) times daily. Hold for sedation    [provider]  cyclobenzaprine (FLEXERIL) 5 MG tablet Take 5 mg by mouth at bedtime as needed for muscle spasms. Take 1.5 tabs =7.5mg  prn    [provider]  docusate sodium (COLACE) 100 MG capsule Take 100 mg by mouth daily as needed for mild constipation.    [provider]  ELIQUIS 5 MG TABS tablet Take 5 mg by mouth 2 (two) times daily. 07/01/18   [provider]  fentaNYL (DURAGESIC - DOSED MCG/HR) 12 MCG/HR Place 1 patch (12.5 mcg total) onto the skin every 3 (three) days. 10/15/17   Loletha Grayer, MD  ferrous sulfate 325 (65 FE) MG EC tablet Take 1 tablet (  325 mg total) by mouth 3 (three) times daily with meals. Patient taking differently: Take 325 mg by mouth daily with breakfast.  01/06/18   Earlie Server, MD  furosemide (LASIX) 40 MG tablet Take 1 tablet (40 mg total) by mouth daily. 10/15/17   Loletha Grayer, MD  gabapentin (NEURONTIN) 100 MG capsule Take 100 mg by mouth at bedtime.    [provider]  guaiFENesin (ROBITUSSIN) 100 MG/5ML SOLN Take 15 mLs by mouth 3 (three) times daily as needed for cough or to loosen phlegm.    [provider]  Melatonin 2.5 MG CAPS Take 5 mg by mouth daily as needed.    [provider]  nitroGLYCERIN (NITROSTAT) 0.4 MG SL  tablet Place 0.14 mg under the tongue every 5 (five) minutes as needed for chest pain.    [provider]  Omega-3 Fatty Acids (FISH OIL) 1000 MG CAPS Take 1,000 mg by mouth every morning.    [provider]  oxyCODONE 10 MG TABS Take 1 tablet (10 mg total) by mouth 3 (three) times daily as needed (moderate pain). 10/15/17   Loletha Grayer, MD  oxymetazoline (AFRIN) 0.05 % nasal spray Place 2 sprays into both nostrils 2 (two) times daily as needed for congestion. For nose bleeds    [provider]  potassium chloride SA (K-DUR,KLOR-CON) 20 MEQ tablet Take 20 mEq by mouth daily. With or after meal 10/15/17   [provider]  sodium chloride (OCEAN) 0.65 % SOLN nasal spray Place 2 sprays into both nostrils 2 (two) times daily as needed for congestion.    [provider]  TRELEGY ELLIPTA 100-62.5-25 MCG/INH AEPB Inhale 1 puff into the lungs daily.  09/24/18   [provider]     Allergies Patient has no known allergies.  Family History  Problem Relation Age of Onset  . Varicose Veins Neg Hx     Social History Social History   Tobacco Use  . Smoking status: Former Smoker    Packs/day: 1.00    Years: 44.00    Pack years: 44.00    Types: Cigarettes    Last attempt to quit: 11/17/2012    Years since quitting: 6.1  . Smokeless tobacco: Never Used  Substance Use Topics  . Alcohol use: No  . Drug use: No    Level 5 caveat: Unable to obtain review of Systems to altered mental status     ____________________________________________   PHYSICAL EXAM:  VITAL SIGNS: ED Triage Vitals [12/27/18 0935]  Enc Vitals Group     BP      Pulse      Resp      Temp      Temp src      SpO2      Weight 104.3 kg (230 lb)     Height 1.676 m (5\' 6" )     Head Circumference      Peak Flow      Pain Score      Pain Loc      Pain Edu?      Excl. in [place]?     Constitutional: Alert but appears confused Eyes: Conjunctivae are normal.  Head:  Atraumatic. Nose: No congestion/rhinnorhea. Mouth/Throat: Mucous membranes are moist.    Cardiovascular: Tachycardia, regular rhythm. Grossly normal heart sounds.  Good peripheral circulation. Respiratory: Increased respiratory effort with tachypnea, no retractions, on nonrebreather currently, bibasilar [place] Gastrointestinal: Soft and nontender. No distention.    Musculoskeletal: Left AKA, no edema right leg  warm and well perfused Neurologic:   No gross focal neurologic deficits are appreciated.  Skin:  Skin is warm, dry and intact. No rash noted.   ____________________________________________   LABS (all labs ordered are listed, but only abnormal results are displayed)  Labs Reviewed  LACTIC ACID, PLASMA - Abnormal; Notable for the following components:      Result Value   Lactic Acid, Venous 2.5 (*)    All other components within normal limits  CBC WITH DIFFERENTIAL/PLATELET - Abnormal; Notable for the following components:   WBC 15.0 (*)    RDW 17.5 (*)    Neutro Abs 12.0 (*)    Abs Immature Granulocytes 0.71 (*)    All other components within normal limits  COMPREHENSIVE METABOLIC PANEL - Abnormal; Notable for the following components:   Potassium 5.2 (*)    CO2 17 (*)    Glucose, Bld 136 (*)    BUN 45 (*)    Creatinine, Ser 2.88 (*)    Calcium 8.2 (*)    Total Protein 8.2 (*)    Albumin 2.8 (*)    AST 51 (*)    Total Bilirubin 1.9 (*)    GFR calc non Af Amer 21 (*)    GFR calc Af Amer 25 (*)    All other components within normal limits  FIBRIN DERIVATIVES D-DIMER ([facility] ONLY) - Abnormal; Notable for the following components:   Fibrin derivatives D-dimer [facility]) 4,403.47 (*)    All other components within normal limits  LACTATE DEHYDROGENASE - Abnormal; Notable for the following components:   LDH 411 (*)    All other components within normal limits  FERRITIN - Abnormal; Notable for the following components:   Ferritin 416 (*)    All other components within normal  limits  TRIGLYCERIDES - Abnormal; Notable for the following components:   Triglycerides 195 (*)    All other components within normal limits  CULTURE, BLOOD (ROUTINE X 2)  CULTURE, BLOOD (ROUTINE X 2)  SARS CORONAVIRUS 2 (HOSPITAL ORDER, [place] [facility])  URINE CULTURE  PROCALCITONIN  LACTIC ACID, PLASMA  FIBRINOGEN  C-REACTIVE PROTEIN  URINALYSIS, COMPLETE (UACMP) WITH MICROSCOPIC   ____________________________________________  EKG  ED ECG REPORT I, Lavonia Drafts, the attending physician, personally viewed and interpreted this ECG.  Date: 12/27/2018  Rhythm: normal sinus rhythm QRS Axis: normal Intervals: normal ST/T Wave abnormalities: normal Narrative Interpretation: no evidence of acute ischemia  ____________________________________________  RADIOLOGY  Chest x-ray with bilateral infiltrates ____________________________________________   PROCEDURES  Procedure(s) performed: No  Procedures   Critical Care performed: yes  CRITICAL CARE Performed by: Lavonia Drafts   Total critical care time: 35 minutes  Critical care time was exclusive of separately billable procedures and treating other patients.  Critical care was necessary to treat or prevent imminent or life-threatening deterioration.  Critical care was time spent personally by me on the following activities: development of treatment plan with patient and/or surrogate as well as nursing, discussions with consultants, evaluation of patient's response to treatment, examination of patient, obtaining history from patient or surrogate, ordering and performing treatments and interventions, ordering and review of laboratory studies, ordering and review of radiographic studies, pulse oximetry and re-evaluation of patient's condition.  ____________________________________________   INITIAL IMPRESSION / ASSESSMENT AND PLAN / ED COURSE  Pertinent labs & imaging results that were available  during my care of the patient were reviewed by me and considered in my medical decision making (see chart for details).  Patient  presents with known COVID-19, now with respiratory distress, reportedly satting in the low 80s at [facility] placed on nonrebreather by EMS with significant improvement in saturation.  Chest x-ray demonstrates bilateral infiltrates, pending labs will maintain on nonrebreather at this time   Patient's labs demonstrate acute on chronic kidney injury, elevated d-dimer, elevated white blood cell count, elevated lactic, blood pressure has improved with IV fluids.  Discussed with Dr. Venetia Constable at [place] who accepts the patient in tx   Timothy Juarez was evaluated in Emergency Department on 12/27/2018 for the symptoms described in the history of present illness. He was evaluated in the context of the global COVID-19 pandemic, which necessitated consideration that the patient might be at risk for infection with the SARS-CoV-2 virus that causes COVID-19. Institutional protocols and algorithms that pertain to the evaluation of patients at risk for COVID-19 are in a state of rapid change based on information released by regulatory bodies including the CDC and federal and state organizations. These policies and algorithms were followed during the patient's care in the ED.     ____________________________________________   FINAL CLINICAL IMPRESSION(S) / ED DIAGNOSES  Final diagnoses:  COVID-19  Acute respiratory failure with hypoxia ([place])        Note:  This document was prepared using Dragon voice recognition software and may include unintentional dictation errors.   Lavonia Drafts, MD 12/27/18 1100

## 2018-12-27 NOTE — H&P (Addendum)
History and Physical  Timothy Juarez:[id number] DOB: 02/25/1951 DOA: 12/27/2018  PCP: Alvester Morin, MD Patient coming from: [facility]  I have personally briefly reviewed patient's old medical records in [place]   Chief Complaint: Shortness of breath  HPI: Timothy Juarez is a 68 y.o. male past medical history of chronic anemia, recurrent DVTs on Eliquis, peripheral vascular disease, chronic diastolic heart failure, COPD not oxygen dependent, peripheral vascular disease, with a history of a left AKA and essential hypertension who presented to [facility] from [place] facility as he became short of breath at [facility] facility, his COVID-19 test was positive last week, at [facility] he was found to be satting in the 80s in the morning of admission put in a nonrebreather by EMS on 10 L and saturations improved to greater than 95%.  The patient is a poor historian and cannot provide a history so most of the history was obtained from the chart and the ED physician.  ED: Chest x-ray showed bilateral diffuse infiltrates, he is afebrile satting greater than 95% on 10 L of oxygen.  Mild hyperkalemia, mild lactic acidosis, elevated LDH a ferritin of 416, CRP of 15 lactic acid of 2.5 procalcitonin was 75.  He was started empirically on IV Rocephin and azithromycin.  With a mild leukocytosis and a left shift.  Review of Systems: All systems reviewed and apart from history of presenting illness, are negative.  Past Medical History:  Diagnosis Date  . Acute embolism and thombos unsp deep vn unsp lower extremity ([place])   . Acute respiratory failure ([place])   . Allergy   . Anemia   . Aneurysm of unspecified site ([place])   . ARF (acute respiratory failure) ([place])    H/O  . Bronchitis   . CHF (congestive heart failure) ([place])   . COPD (chronic obstructive pulmonary disease) ([place])   . Cough   . Epistaxis   . GERD (gastroesophageal reflux disease)   . Gout    . Hyperlipidemia   . Hypertension   . Hypokalemia   . Insomnia   . Muscle contracture    MUSCLE SPASMS, muscle weakness  . Peripheral vascular disease ([place])   . Pneumonia   . Pressure ulcer    Past Surgical History:  Procedure Laterality Date  . AMPUTATION Left 09/19/2015   Procedure: AMPUTATION BELOW KNEE;  Surgeon: Algernon Huxley, MD;  Location: [facility] ORS;  Service: Vascular;  Laterality: Left;  . AMPUTATION Left 11/01/2015   Procedure: AMPUTATION ABOVE KNEE;  Surgeon: Algernon Huxley, MD;  Location: [facility] ORS;  Service: Vascular;  Laterality: Left;  . APPLICATION OF WOUND VAC Left 10/18/2015   Procedure: APPLICATION OF WOUND VAC;  Surgeon: Algernon Huxley, MD;  Location: [facility] ORS;  Service: Vascular;  Laterality: Left;  . COLONOSCOPY WITH PROPOFOL N/A 05/25/2017   Procedure: COLONOSCOPY WITH PROPOFOL;  Surgeon: Jonathon Bellows, MD;  Location: [facility] ENDOSCOPY;  Service: Gastroenterology;  Laterality: N/A;  . COLONOSCOPY WITH PROPOFOL N/A 10/14/2017   Procedure: COLONOSCOPY WITH PROPOFOL;  Surgeon: Jonathon Bellows, MD;  Location: [facility] ENDOSCOPY;  Service: Gastroenterology;  Laterality: N/A;  . ESOPHAGOGASTRODUODENOSCOPY (EGD) WITH PROPOFOL N/A 05/25/2017   Procedure: ESOPHAGOGASTRODUODENOSCOPY (EGD) WITH PROPOFOL;  Surgeon: Jonathon Bellows, MD;  Location: [facility] ENDOSCOPY;  Service: Gastroenterology;  Laterality: N/A;  . ESOPHAGOGASTRODUODENOSCOPY (EGD) WITH PROPOFOL N/A 10/14/2017   Procedure: ESOPHAGOGASTRODUODENOSCOPY (EGD) WITH PROPOFOL;  Surgeon: Jonathon Bellows, MD;  Location: [facility] ENDOSCOPY;  Service: Gastroenterology;  Laterality: N/A;  . GIVENS CAPSULE STUDY N/A 07/14/2017   Procedure: GIVENS CAPSULE STUDY;  Surgeon: Jonathon Bellows, MD;  Location: [facility] ENDOSCOPY;  Service: Gastroenterology;  Laterality: N/A;  . GIVENS CAPSULE STUDY N/A 10/30/2017   Procedure: GIVENS CAPSULE STUDY 12 HR;  Surgeon: Jonathon Bellows, MD;  Location: [facility] ENDOSCOPY;  Service: Gastroenterology;  Laterality: N/A;  . IVC FILTER INSERTION N/A  10/12/2017   Procedure: IVC FILTER INSERTION;  Surgeon: Algernon Huxley, MD;  Location: [place] CV LAB;  Service: Cardiovascular;  Laterality: N/A;  . LOWER EXTREMITY ANGIOGRAPHY Right 10/04/2018   Procedure: LOWER EXTREMITY ANGIOGRAPHY;  Surgeon: Algernon Huxley, MD;  Location: [place] CV LAB;  Service: Cardiovascular;  Laterality: Right;  . PERIPHERAL VASCULAR CATHETERIZATION Left 01/18/2015   Procedure: Lower Extremity Angiography;  Surgeon: Algernon Huxley, MD;  Location: [place] CV LAB;  Service: Cardiovascular;  Laterality: Left;  . PERIPHERAL VASCULAR CATHETERIZATION N/A 01/18/2015   Procedure: Lower Extremity Intervention;  Surgeon: Algernon Huxley, MD;  Location: [place] CV LAB;  Service: Cardiovascular;  Laterality: N/A;  . PERIPHERAL VASCULAR CATHETERIZATION  07/30/2015   Procedure: Lower Extremity Intervention;  Surgeon: Algernon Huxley, MD;  Location: [place] CV LAB;  Service: Cardiovascular;;  . PERIPHERAL VASCULAR CATHETERIZATION N/A 07/30/2015   Procedure: Abdominal Aortogram w/Lower Extremity;  Surgeon: Algernon Huxley, MD;  Location: [place] CV LAB;  Service: Cardiovascular;  Laterality: N/A;  . PERIPHERAL VASCULAR CATHETERIZATION Left 08/22/2015   Procedure: Lower Extremity Angiography;  Surgeon: Algernon Huxley, MD;  Location: [place] CV LAB;  Service: Cardiovascular;  Laterality: Left;  . PERIPHERAL VASCULAR CATHETERIZATION Left 08/22/2015   Procedure: Lower Extremity Intervention;  Surgeon: Algernon Huxley, MD;  Location: [place] CV LAB;  Service: Cardiovascular;  Laterality: Left;  . PERIPHERAL VASCULAR CATHETERIZATION Right 03/31/2016   Procedure: Lower Extremity Angiography;  Surgeon: Algernon Huxley, MD;  Location: [place] CV LAB;  Service: Cardiovascular;  Laterality: Right;  . PERIPHERAL VASCULAR CATHETERIZATION  03/31/2016   Procedure: Lower Extremity Intervention;  Surgeon: Algernon Huxley, MD;  Location: [place] CV LAB;  Service: Cardiovascular;;  .  PERIPHERAL VASCULAR CATHETERIZATION Left 04/10/2016   Procedure: Lower Extremity Angiography;  Surgeon: Algernon Huxley, MD;  Location: [place] CV LAB;  Service: Cardiovascular;  Laterality: Left;  . WOUND DEBRIDEMENT Left 10/18/2015   Procedure: DEBRIDEMENT WOUND   ( LEFT BKA DEBRIDEMENT );  Surgeon: Algernon Huxley, MD;  Location: [facility] ORS;  Service: Vascular;  Laterality: Left;   Social History:  reports that he quit smoking about 6 years ago. His smoking use included cigarettes. He has a 44.00 pack-year smoking history. He has never used smokeless tobacco. He reports that he does not drink alcohol or use drugs.   No Known Allergies  Family History  Problem Relation Age of Onset  . Heart attack Mother   . Varicose Veins Neg Hx     Prior to Admission medications   Medication Sig Start Date End Date Taking? Authorizing Provider  acetaminophen (TYLENOL) 325 MG tablet Take 650 mg by mouth every 4 (four) hours as needed for fever.     [provider]  aspirin EC 81 MG tablet Take 1 tablet (81 mg total) by mouth daily. 10/04/18   Algernon Huxley, MD  atorvastatin (LIPITOR) 10 MG tablet Take 1 tablet (10 mg total) by mouth daily. 10/04/18 10/04/19  Algernon Huxley, MD  azithromycin (ZITHROMAX) 250 MG tablet Take 250 mg by  mouth daily. 12/25/18 12/28/18  [provider]  baclofen (LIORESAL) 10 MG tablet Take 10 mg by mouth 3 (three) times daily. Hold for sedation    [provider]  docusate sodium (COLACE) 100 MG capsule Take 100 mg by mouth daily as needed for mild constipation.    [provider]  ELIQUIS 5 MG TABS tablet Take 5 mg by mouth 2 (two) times daily. 07/01/18   [provider]  fentaNYL (DURAGESIC - DOSED MCG/HR) 12 MCG/HR Place 1 patch (12.5 mcg total) onto the skin every 3 (three) days. 10/15/17   Loletha Grayer, MD  ferrous sulfate 324 (65 Fe) MG TBEC Take 324 mg by mouth daily.    [provider]  furosemide (LASIX) 40 MG tablet Take 1  tablet (40 mg total) by mouth daily. 10/15/17   Loletha Grayer, MD  gabapentin (NEURONTIN) 100 MG capsule Take 100 mg by mouth at bedtime.    [provider]  guaiFENesin (ROBITUSSIN) 100 MG/5ML SOLN Take 15 mLs by mouth 3 (three) times daily as needed for cough or to loosen phlegm.    [provider]  loperamide (IMODIUM) 2 MG capsule Take 2 mg by mouth 3 (three) times daily as needed for diarrhea or loose stools.    [provider]  LORazepam (ATIVAN) 0.5 MG tablet Take 0.5 mg by mouth Nightly. Also may give one tablet as needed for anxiety/insomnia    [provider]  Melatonin 5 MG TABS Take 5 mg by mouth at bedtime as needed (insomnia).     [provider]  nitroGLYCERIN (NITROSTAT) 0.4 MG SL tablet Place 0.14 mg under the tongue every 5 (five) minutes as needed for chest pain.    [provider]  Omega-3 Fatty Acids (FISH OIL) 1000 MG CAPS Take 1,000 mg by mouth every morning.    [provider]  oxyCODONE 10 MG TABS Take 1 tablet (10 mg total) by mouth 3 (three) times daily as needed (moderate pain). 10/15/17   Loletha Grayer, MD  oxymetazoline (AFRIN) 0.05 % nasal spray Place 2 sprays into both nostrils 2 (two) times daily as needed for congestion. For nose bleeds    [provider]  potassium chloride SA (K-DUR,KLOR-CON) 20 MEQ tablet Take 20 mEq by mouth daily. With or after meal 10/15/17   [provider]  sodium chloride (OCEAN) 0.65 % SOLN nasal spray Place 2 sprays into both nostrils 2 (two) times daily as needed for congestion.    [provider]  TRELEGY ELLIPTA 100-62.5-25 MCG/INH AEPB Inhale 1 puff into the lungs daily.  09/24/18   [provider]   Physical Exam: There were no vitals filed for this visit.   General exam: Disheveled male, laying comfortably in bed in no acute distress  Head, eyes and ENT: Dry mucous membrane nontraumatic head.  Neck: Neck is supple no JVD   Lymphatics: No lymphadenopathy.  Respiratory system: Good air movement and crackles at bases and wheezing.  Cardiovascular system: Regular rate and rhythm with positive S1-S2 no appreciated JVD.  Gastrointestinal system: Bowel sounds soft nontender nondistended  Central nervous system: Alert and oriented only to person, moving all 4 extremities, 3-12 are grossly intact  Extremities: Symmetric 5 x 5 power.  On the right, stump is clean on the left he has an AKA  Skin: Stage II sacral decubitus ulcer  Musculoskeletal system: Negative exam.    Labs on Admission:  Basic Metabolic Panel: Recent Labs  Lab 12/27/18 0945  NA 140  K 5.2*  CL 109  CO2 17*  GLUCOSE 136*  BUN 45*  CREATININE 2.88*  CALCIUM 8.2*   Liver Function Tests: Recent Labs  Lab 12/27/18 0945  AST 51*  ALT 24  ALKPHOS 75  BILITOT 1.9*  PROT 8.2*  ALBUMIN 2.8*   No results for input(s): LIPASE, AMYLASE in the last 168 hours. No results for input(s): AMMONIA in the last 168 hours. CBC: Recent Labs  Lab 12/27/18 0945  WBC 15.0*  NEUTROABS 12.0*  HGB 13.8  HCT 42.0  MCV 81.4  PLT 314   Cardiac Enzymes: No results for input(s): CKTOTAL, CKMB, CKMBINDEX, TROPONINI in the last 168 hours.  BNP (last 3 results) No results for input(s): PROBNP in the last 8760 hours. CBG: No results for input(s): GLUCAP in the last 168 hours.  Radiological Exams on Admission: Dg Chest Port 1 View  Result Date: 12/27/2018 CLINICAL DATA:  Difficulty breathing.  Reported COVID-19 positive EXAM: PORTABLE CHEST 1 VIEW COMPARISON:  October 10, 2017 FINDINGS: There is patchy airspace opacity throughout the lungs bilaterally, most notably in the lung bases. There is patchy consolidation in the lung bases. There is fibrosis in each upper lobe which is essentially stable compared to the prior study. Heart size and pulmonary vascularity normal. No adenopathy. No bone lesions. IMPRESSION: Patchy airspace disease in the lungs  bilaterally, most notably in the lower lobes where there are is patchy consolidation. Fibrosis and bullous disease in the upper lobes remains. Stable cardiac silhouette. Note that the findings in the lower lung regions may be consistent with atypical pneumonia including viral type pneumonitis. Electronically Signed   By: Lowella Grip III M.D.   On: 12/27/2018 09:59    EKG: Independently reviewed.  Normal sinus rhythm normal axis normal interval no T wave abnormalities.  Assessment/Plan  Acute on chronic respiratory failure with hypoxia multifactorial due to Community acquired bilateral lower lobe pneumonia  COVID-19 virus infection: He seems to be dry on physical exam. I will give him a liter of normal saline and then continue him at 100 cc an hour. Will start him empirically on IV Rocephin and azithromycin his procalcitonin was 0.75, he has a leukocytosis with a left shift no lymphopenia. Get blood cultures and sputum culture. Monitor strict I's and O's. SARS Cov-2 positive on 12/27/2018. Check inflammatory markers. Start Actemra and solumedrol, patient is currently wheezing. The treatment plan and use of medications and known side effects (Actemra) were discussed with patient/family, they were clearly explained that there is no proven definitive treatment for COVID-19 infection, any medications used here are based on published clinical articles/anecdotal data which are not peer-reviewed or randomized control trials.  Complete risks and long-term side effects are unknown, however in the best clinical judgment they seem to be of some clinical benefit rather than medical risks.  Patient/family agree with the treatment plan and want to receive the given medications.  High anion gap metabolic acidosis/Severe sepsis ([place]) Possibly due to infectious  etiology has a leukocytosis with a left shift procalcitonin is elevated and hypoxia. He was started empirically on IV Rocephin and azithromycin and  started on gentle IV fluid hydration. He has high anion gap and his lactic acid is 2.5.  Which is probably a combination of hypoxia hypovolemia and sepsis versus Sirs.  Toxic encephalopathy: Unclear of whether is due to his medication or infectious etiology.  As he has new acute renal failure and he is on a fentanyl patch, oxycodone, gabapentin and  benzodiazepines. At this point in time we will hydrate and hold all of his psychotropics, narcotics and sedatives. Place the patient n.p.o. as he is currently encephalopathic. He is able to follow commands and non focal on physical exam so unlikely to be an ischemic event  AKI: Likely hemodynamically mediated, will start him on gentle IV fluid hydration hold his diuretics and recheck a basic metabolic panel in the morning. His baseline creatinine is around 1.  Hyperkalemia: In the setting of hypovolemia, twelve-lead EKG showed NO peaked T waves or affected intervals. We will hydrate him and recheck a basic metabolic panel in the morning, as it will probably improve.  PAD (peripheral artery disease) (HCC)/history of left AKA: Stump is clean continue aspirin.  Present on admission stage I sacral pressure ulcer Wound care consult.  Essential hypertension: Hold diuretic therapy blood pressure seems to be stable.  Chronic deep vein thrombosis (DVT) of left lower extremity ([place]): Continue Eliquis.  Mild elevation in LFTs: AST more than ALT likely due to COVID-19 infection. Alkaline phosphatase 75 total bilirubin 1.9 likely high in the setting of dehydration  Hx of AKA (above knee amputation), left (HCC) No signs of infection stump is clean.    DVT Prophylaxis: Eliquis Code Status: Full  Family Communication: Daughter  Disposition Plan: admitted to progressive     It is my clinical opinion that admission to INPATIENT is reasonable and necessary in this 68 y.o. male of hypertension peripheral vascular disease and diastolic heart failure  who comes in for acute encephalopathy and shortness of breath in the setting of a COVID-19 positive PCR, he was started on IV Rocephin and azithro he was started on IV fluid hydration, with new acute renal failure.  Given the aforementioned, the predictability of an adverse outcome is felt to be significant. I expect that the patient will require at least 2 midnights in the hospital to treat this condition.  Charlynne Cousins MD [facility]   12/27/2018, 2:20 PM

## 2018-12-28 ENCOUNTER — Other Ambulatory Visit: Payer: Self-pay

## 2018-12-28 DIAGNOSIS — I82512 Chronic embolism and thrombosis of left femoral vein: Secondary | ICD-10-CM | POA: Diagnosis not present

## 2018-12-28 DIAGNOSIS — I10 Essential (primary) hypertension: Secondary | ICD-10-CM | POA: Diagnosis not present

## 2018-12-28 DIAGNOSIS — I5032 Chronic diastolic (congestive) heart failure: Secondary | ICD-10-CM | POA: Diagnosis not present

## 2018-12-28 DIAGNOSIS — J440 Chronic obstructive pulmonary disease with acute lower respiratory infection: Secondary | ICD-10-CM | POA: Diagnosis not present

## 2018-12-28 DIAGNOSIS — J181 Lobar pneumonia, unspecified organism: Secondary | ICD-10-CM | POA: Diagnosis not present

## 2018-12-28 DIAGNOSIS — N179 Acute kidney failure, unspecified: Secondary | ICD-10-CM | POA: Diagnosis not present

## 2018-12-28 DIAGNOSIS — G92 Toxic encephalopathy: Secondary | ICD-10-CM | POA: Diagnosis not present

## 2018-12-28 DIAGNOSIS — J9601 Acute respiratory failure with hypoxia: Secondary | ICD-10-CM | POA: Diagnosis not present

## 2018-12-28 DIAGNOSIS — Z89612 Acquired absence of left leg above knee: Secondary | ICD-10-CM | POA: Diagnosis not present

## 2018-12-28 DIAGNOSIS — I82502 Chronic embolism and thrombosis of unspecified deep veins of left lower extremity: Secondary | ICD-10-CM | POA: Diagnosis not present

## 2018-12-28 DIAGNOSIS — L89153 Pressure ulcer of sacral region, stage 3: Secondary | ICD-10-CM | POA: Diagnosis not present

## 2018-12-28 DIAGNOSIS — E872 Acidosis: Secondary | ICD-10-CM | POA: Diagnosis not present

## 2018-12-28 DIAGNOSIS — J1289 Other viral pneumonia: Secondary | ICD-10-CM | POA: Diagnosis not present

## 2018-12-28 DIAGNOSIS — K7200 Acute and subacute hepatic failure without coma: Secondary | ICD-10-CM | POA: Diagnosis not present

## 2018-12-28 DIAGNOSIS — J9621 Acute and chronic respiratory failure with hypoxia: Secondary | ICD-10-CM | POA: Diagnosis not present

## 2018-12-28 DIAGNOSIS — U071 COVID-19: Secondary | ICD-10-CM | POA: Diagnosis not present

## 2018-12-28 DIAGNOSIS — I739 Peripheral vascular disease, unspecified: Secondary | ICD-10-CM

## 2018-12-28 DIAGNOSIS — A4189 Other specified sepsis: Secondary | ICD-10-CM | POA: Diagnosis not present

## 2018-12-28 LAB — CBC
HCT: 38 % — ABNORMAL LOW (ref 39.0–52.0)
Hemoglobin: 12.4 g/dL — ABNORMAL LOW (ref 13.0–17.0)
MCH: 27.9 pg (ref 26.0–34.0)
MCHC: 32.6 g/dL (ref 30.0–36.0)
MCV: 85.4 fL (ref 80.0–100.0)
Platelets: 342 10*3/uL (ref 150–400)
RBC: 4.45 MIL/uL (ref 4.22–5.81)
RDW: 17.2 % — ABNORMAL HIGH (ref 11.5–15.5)
WBC: 12.2 10*3/uL — ABNORMAL HIGH (ref 4.0–10.5)
nRBC: 0 % (ref 0.0–0.2)

## 2018-12-28 LAB — COMPREHENSIVE METABOLIC PANEL
ALT: 22 U/L (ref 0–44)
AST: 55 U/L — ABNORMAL HIGH (ref 15–41)
Albumin: 2.4 g/dL — ABNORMAL LOW (ref 3.5–5.0)
Alkaline Phosphatase: 62 U/L (ref 38–126)
Anion gap: 7 (ref 5–15)
BUN: 43 mg/dL — ABNORMAL HIGH (ref 8–23)
CO2: 18 mmol/L — ABNORMAL LOW (ref 22–32)
Calcium: 7.6 mg/dL — ABNORMAL LOW (ref 8.9–10.3)
Chloride: 116 mmol/L — ABNORMAL HIGH (ref 98–111)
Creatinine, Ser: 1.45 mg/dL — ABNORMAL HIGH (ref 0.61–1.24)
GFR calc Af Amer: 57 mL/min — ABNORMAL LOW (ref >60)
GFR calc non Af Amer: 49 mL/min — ABNORMAL LOW (ref >60)
Glucose, Bld: 130 mg/dL — ABNORMAL HIGH (ref 70–99)
Potassium: 5.4 mmol/L — ABNORMAL HIGH (ref 3.5–5.1)
Sodium: 141 mmol/L (ref 135–145)
Total Bilirubin: 0.9 mg/dL (ref 0.3–1.2)
Total Protein: 6.6 g/dL (ref 6.5–8.1)

## 2018-12-28 LAB — MRSA PCR SCREENING: MRSA by PCR: NEGATIVE

## 2018-12-28 LAB — C-REACTIVE PROTEIN: CRP: 12.6 mg/dL — ABNORMAL HIGH (ref <1.0)

## 2018-12-28 LAB — D-DIMER, QUANTITATIVE: D-Dimer, Quant: 3.1 ug/mL-FEU — ABNORMAL HIGH (ref 0.00–0.50)

## 2018-12-28 LAB — FERRITIN: Ferritin: 307 ng/mL (ref 24–336)

## 2018-12-28 LAB — STREP PNEUMONIAE URINARY ANTIGEN: Strep Pneumo Urinary Antigen: NEGATIVE

## 2018-12-28 MED ORDER — SODIUM ZIRCONIUM CYCLOSILICATE 10 G PO PACK
10.0000 g | PACK | Freq: Every day | ORAL | Status: DC
  Administered 2018-12-29 – 2019-01-01 (×4): 10 g via ORAL
  Filled 2018-12-28 (×6): qty 1

## 2018-12-28 MED ORDER — SODIUM CHLORIDE 0.9 % IV SOLN
500.0000 mg | INTRAVENOUS | Status: DC
  Administered 2018-12-28: 18:00:00 500 mg via INTRAVENOUS
  Filled 2018-12-28: qty 500

## 2018-12-28 MED ORDER — TOCILIZUMAB 400 MG/20ML IV SOLN
800.0000 mg | Freq: Once | INTRAVENOUS | Status: AC
  Administered 2018-12-28: 18:00:00 800 mg via INTRAVENOUS
  Filled 2018-12-28: qty 40

## 2018-12-28 MED ORDER — METHYLPREDNISOLONE SODIUM SUCC 125 MG IJ SOLR
40.0000 mg | Freq: Four times a day (QID) | INTRAMUSCULAR | Status: DC
  Administered 2018-12-28 – 2018-12-30 (×8): 40 mg via INTRAVENOUS
  Filled 2018-12-28 (×8): qty 2

## 2018-12-28 NOTE — Evaluation (Signed)
Speech Language Pathology Evaluation Patient Details Name: DEWAUN KINZLER MRN: [id number] DOB: 30-Jul-1951 Today's Date: 12/28/2018 Time: 6945-0388 SLP Time Calculation (min) (ACUTE ONLY): 10 min  Problem List:  Patient Active Problem List   Diagnosis Date Noted  . Acute on chronic respiratory failure with hypoxia ([place]) 12/27/2018  . Community acquired bilateral lower lobe pneumonia ([place]) 12/27/2018  . COVID-19 virus infection 12/27/2018  . High anion gap metabolic acidosis 82/80/0349  . Severe sepsis ([place]) 12/27/2018  . AKI (acute kidney injury) ([place]) 12/27/2018  . Toxic encephalopathy 12/27/2018  . Acute respiratory failure with hypoxia ([place]) 12/27/2018  . Hx of AKA (above knee amputation), left ([place]) 11/02/2018  . Symptomatic anemia 10/10/2017  . Gastrointestinal hemorrhage   . Chronic deep vein thrombosis (DVT) of left lower extremity ([place])   . Iron deficiency anemia 04/21/2017  . Essential hypertension 03/13/2017  . Recurrent deep vein thrombosis (DVT) ([place]) 03/11/2017  . Hyperlipidemia 11/04/2016  . Pressure ulcer 04/11/2016  . Pseudoaneurysm of left femoral artery ([place]) 04/11/2016  . PAD (peripheral artery disease) ([place]) 04/10/2016  . Atherosclerosis of native arteries of extremities with gangrene, left leg ([place]) 11/01/2015  . Ischemia of lower extremity 09/14/2015  . Atherosclerotic peripheral vascular disease with ulceration ([place]) 01/18/2015   Past Medical History:  Past Medical History:  Diagnosis Date  . Acute embolism and thombos unsp deep vn unsp lower extremity ([place])   . Acute respiratory failure ([place])   . Allergy   . Anemia   . Aneurysm of unspecified site ([place])   . ARF (acute respiratory failure) ([place])    H/O  . Bronchitis   . CHF (congestive heart failure) ([place])   . COPD (chronic obstructive pulmonary disease) ([place])   . Cough   . Epistaxis   . GERD (gastroesophageal reflux disease)   . Gout   . Hyperlipidemia   . Hypertension   . Hypokalemia   .  Insomnia   . Muscle contracture    MUSCLE SPASMS, muscle weakness  . Peripheral vascular disease ([place])   . Pneumonia   . Pressure ulcer    Past Surgical History:  Past Surgical History:  Procedure Laterality Date  . AMPUTATION Left 09/19/2015   Procedure: AMPUTATION BELOW KNEE;  Surgeon: Algernon Huxley, MD;  Location: [facility] ORS;  Service: Vascular;  Laterality: Left;  . AMPUTATION Left 11/01/2015   Procedure: AMPUTATION ABOVE KNEE;  Surgeon: Algernon Huxley, MD;  Location: [facility] ORS;  Service: Vascular;  Laterality: Left;  . APPLICATION OF WOUND VAC Left 10/18/2015   Procedure: APPLICATION OF WOUND VAC;  Surgeon: Algernon Huxley, MD;  Location: [facility] ORS;  Service: Vascular;  Laterality: Left;  . COLONOSCOPY WITH PROPOFOL N/A 05/25/2017   Procedure: COLONOSCOPY WITH PROPOFOL;  Surgeon: Jonathon Bellows, MD;  Location: [facility] ENDOSCOPY;  Service: Gastroenterology;  Laterality: N/A;  . COLONOSCOPY WITH PROPOFOL N/A 10/14/2017   Procedure: COLONOSCOPY WITH PROPOFOL;  Surgeon: Jonathon Bellows, MD;  Location: [facility] ENDOSCOPY;  Service: Gastroenterology;  Laterality: N/A;  . ESOPHAGOGASTRODUODENOSCOPY (EGD) WITH PROPOFOL N/A 05/25/2017   Procedure: ESOPHAGOGASTRODUODENOSCOPY (EGD) WITH PROPOFOL;  Surgeon: Jonathon Bellows, MD;  Location: [facility] ENDOSCOPY;  Service: Gastroenterology;  Laterality: N/A;  . ESOPHAGOGASTRODUODENOSCOPY (EGD) WITH PROPOFOL N/A 10/14/2017   Procedure: ESOPHAGOGASTRODUODENOSCOPY (EGD) WITH PROPOFOL;  Surgeon: Jonathon Bellows, MD;  Location: [facility] ENDOSCOPY;  Service: Gastroenterology;  Laterality: N/A;  . GIVENS CAPSULE STUDY N/A 07/14/2017   Procedure: GIVENS CAPSULE STUDY;  Surgeon: Jonathon Bellows, MD;  Location: [facility] ENDOSCOPY;  Service: Gastroenterology;  Laterality: N/A;  .  GIVENS CAPSULE STUDY N/A 10/30/2017   Procedure: GIVENS CAPSULE STUDY 12 HR;  Surgeon: Jonathon Bellows, MD;  Location: [facility] ENDOSCOPY;  Service: Gastroenterology;  Laterality: N/A;  . IVC FILTER INSERTION N/A 10/12/2017   Procedure: IVC FILTER  INSERTION;  Surgeon: Algernon Huxley, MD;  Location: [place] CV LAB;  Service: Cardiovascular;  Laterality: N/A;  . LOWER EXTREMITY ANGIOGRAPHY Right 10/04/2018   Procedure: LOWER EXTREMITY ANGIOGRAPHY;  Surgeon: Algernon Huxley, MD;  Location: [place] CV LAB;  Service: Cardiovascular;  Laterality: Right;  . PERIPHERAL VASCULAR CATHETERIZATION Left 01/18/2015   Procedure: Lower Extremity Angiography;  Surgeon: Algernon Huxley, MD;  Location: [place] CV LAB;  Service: Cardiovascular;  Laterality: Left;  . PERIPHERAL VASCULAR CATHETERIZATION N/A 01/18/2015   Procedure: Lower Extremity Intervention;  Surgeon: Algernon Huxley, MD;  Location: [place] CV LAB;  Service: Cardiovascular;  Laterality: N/A;  . PERIPHERAL VASCULAR CATHETERIZATION  07/30/2015   Procedure: Lower Extremity Intervention;  Surgeon: Algernon Huxley, MD;  Location: [place] CV LAB;  Service: Cardiovascular;;  . PERIPHERAL VASCULAR CATHETERIZATION N/A 07/30/2015   Procedure: Abdominal Aortogram w/Lower Extremity;  Surgeon: Algernon Huxley, MD;  Location: [place] CV LAB;  Service: Cardiovascular;  Laterality: N/A;  . PERIPHERAL VASCULAR CATHETERIZATION Left 08/22/2015   Procedure: Lower Extremity Angiography;  Surgeon: Algernon Huxley, MD;  Location: [place] CV LAB;  Service: Cardiovascular;  Laterality: Left;  . PERIPHERAL VASCULAR CATHETERIZATION Left 08/22/2015   Procedure: Lower Extremity Intervention;  Surgeon: Algernon Huxley, MD;  Location: [place] CV LAB;  Service: Cardiovascular;  Laterality: Left;  . PERIPHERAL VASCULAR CATHETERIZATION Right 03/31/2016   Procedure: Lower Extremity Angiography;  Surgeon: Algernon Huxley, MD;  Location: [place] CV LAB;  Service: Cardiovascular;  Laterality: Right;  . PERIPHERAL VASCULAR CATHETERIZATION  03/31/2016   Procedure: Lower Extremity Intervention;  Surgeon: Algernon Huxley, MD;  Location: [place] CV LAB;  Service: Cardiovascular;;  . PERIPHERAL VASCULAR CATHETERIZATION Left  04/10/2016   Procedure: Lower Extremity Angiography;  Surgeon: Algernon Huxley, MD;  Location: [place] CV LAB;  Service: Cardiovascular;  Laterality: Left;  . WOUND DEBRIDEMENT Left 10/18/2015   Procedure: DEBRIDEMENT WOUND   ( LEFT BKA DEBRIDEMENT );  Surgeon: Algernon Huxley, MD;  Location: [facility] ORS;  Service: Vascular;  Laterality: Left;   HPI:  Pt is a 68 yo male presenting from [facility] due to worsening dyspnea and AMS after testing positive for COVID-19 a week prior. PMH includes: PNA, pressure ulcer, muscle contracture, insomnia, HTN, HLD, gout, GERD, COPD, CHF   Assessment / Plan / Recommendation Clinical Impression  Pt's baseline mentation is unknown, but he presents today with orientation to person only. Mod cues were provided for sustained attention, simple problem solving, initiation, and to elicit responses to simple questions and commands. Pt also verbalized not wanting to participate in therapeutic activities, so question if this could also be impacting performance. SLP will follow to maximize cognitive function.    SLP Assessment  SLP Recommendation/Assessment: Patient needs continued Speech Lanaguage Pathology Services SLP Visit Diagnosis: Cognitive communication deficit (R41.841)    Follow Up Recommendations  Skilled Nursing facility    Frequency and Duration min 2x/week  2 weeks      SLP Evaluation Cognition  Overall Cognitive Status: No family/caregiver present to determine baseline cognitive functioning Arousal/Alertness: Awake/alert Orientation Level: Oriented to person;Disoriented to place;Disoriented to situation;Disoriented to time Attention: Sustained Sustained Attention: Impaired Sustained Attention Impairment: Functional basic;Verbal basic  Awareness: Impaired Awareness Impairment: Intellectual impairment;Emergent impairment Problem Solving: Impaired Problem Solving Impairment: Functional basic;Verbal basic       Comprehension  Auditory  Comprehension Overall Auditory Comprehension: Impaired Commands: Impaired One Step Basic Commands: 50-74% accurate Conversation: Simple    Expression Expression Primary Mode of Expression: Verbal Verbal Expression Overall Verbal Expression: (limited verbal output)   Oral / Motor  Oral Motor/Sensory Function Overall Oral Motor/Sensory Function: (assessment limited by participation vs command following) Motor Speech Overall Motor Speech: Appears within functional limits for tasks assessed   GO                    Venita Sheffield Gayatri Teasdale 12/28/2018, 6:09 PM  Pollyann Glen, M.A. [place] Acute Environmental education officer [phone] Office [phone]

## 2018-12-28 NOTE — Progress Notes (Signed)
Pt's QTc hanging in range 508-520; EKG Prolonged QTC, 1st degree heart block; BP166-92, P72, O2 94% 5L, RR17; No distress noted. MD aware.

## 2018-12-28 NOTE — Progress Notes (Signed)
PROGRESS NOTE  Timothy Juarez  QJF:[id number] DOB: [birth year] DOA: 12/27/2018 PCP: Alvester Morin, MD   Brief Narrative: Timothy Juarez is a 68 y.o. male with a history of HFpEF, COPD, recurrent DVTs on eliquis, PVD s/p left AKA, and anemia who presented from [place] facility due to shortness of breath. He had tested positive for covid-19 infection a week prior and developed dyspnea 5/11 prompting evaluation at [facility] where he was found to be hypoxic and encephalopathic. CXR showed bilateral infiltrates and WBC was elevated with left shift and elevation of inflammatory markers. Initially put on 10L of oxygen this was quickly weaned to 6L O2. Ceftriaxone and azithromycin were given, cultures taken and he was admitted to [facility] where steroids and actemra were given.   Assessment & Plan: Active Problems:   PAD (peripheral artery disease) (HCC)   Pressure ulcer   Essential hypertension   Symptomatic anemia   Chronic deep vein thrombosis (DVT) of left lower extremity (HCC)   Hx of AKA (above knee amputation), left (HCC)   Acute on chronic respiratory failure with hypoxia ([place])   Community acquired bilateral lower lobe pneumonia ([place])   COVID-19 virus infection   High anion gap metabolic acidosis   Severe sepsis ([place])   AKI (acute kidney injury) ([place])   Toxic encephalopathy   Acute respiratory failure with hypoxia ([place])  Acute respiratory failure due to CAP and covid-19 infection: At very high risk of acute decompensation. ABG from yesterday evening showed compensatory hyperventilation and hypoxia.  - Continue airborne, contact precautions. PPE including surgical gown, gloves, face shield, cap, shoe covers, and N-95 used during this encounter in a negative pressure room.  - Check daily labs: CBC w/diff, CMP, d-dimer, fibrinogen, ferritin, LDH, CRP - Blood cultures drawn, NGTD.  - Avoid NSAIDs - Continue steroids. Wheezing at admission, improved now.  - Recommend  proning and aggressive use of incentive spirometry. - Given actemra at 800mg  on admission, continues to require 6L O2, will repeat dose. He is not known to be on immunomodulators, anti-rejection medications, or cancer chemotherapy, Platelets are 342 (>50k), ANC is 12k (>500), and ALT/AST are well below 5x ULN with no known hepatitis B infection or TB history. The investigational nature of this medication was discussed with the patient and at length with his daughter today. They choose to proceed as the potential benefits are felt to outweigh risks at this time. Will monitor LFTs and for infusion reaction. - Goals of care were discussed. I have shared that his prognosis is guarded.  Sepsis due to CAP and covid-19 infection:  - Continue IVF  - Monitor cultures - Medications as above   Acute toxic/metabolic encephalopathy:  - Continue Tx of infection as above - Holding home sedating medications - Delirium precautions - SLP evaluation and if able, start po medications.   AKI: Suspect hemodynamically mediated and ?obstructive. Improved with fluids. - Continue hydration - Bladder scan and insert foley if retaining.  - Monitor UOP  Hyperkalemia: Mild, due to AKI. No ECG changes. Timothy Juarez, can give lasix in addition to IVF if hyperkalemia persistent, though Cr improving would anticipate improvement in K. - Continue telemetry monitoring.  Lactic acidosis: Due to hypoxia and sepsis.   Mildly elevated LFTs: Bilirubin has normalized - Continue monitoring.  Morbid obesity: BMI is 37, though this is calculated in the absence of lower leg s/p AKA.  - Weight loss recommended.  - If passes SLP eval, would benefit from dietitian consultation. Albumin noted  to be low.  Stage III sacral pressure injury: Noted wound going back to 2017.  - WOC consulted. Unable to document without my phone due to PPE. Will ask RN staff to put into chart.  PAD, hx left AKA:  - Noted, stump site uninfected  History  of DVT:  - Continue anticoagulation with eliquis  HTN:  - Monitor  DVT prophylaxis: Eliquis Code Status: Full Family Communication: Daughter by phone Disposition Plan: Pending clinical course.  Consultants:   None  Procedures:   Actemra given 5/11  Antimicrobials:  Ceftriaxone, azithromycin   Subjective: Pt drowsy but rousable, denies being in pain, reports feeling short of breath but not as bad as yesterday, otherwise is resistant to further history taking.   Objective: Vitals:   12/28/18 0900 12/28/18 1000 12/28/18 1100 12/28/18 1200  BP: (!) 150/78 140/75 (!) 157/79 (!) 149/75  Pulse: 73 (!) 56 70 63  Resp: 20 17 16 20   Temp:   98.1 F (36.7 C)   TempSrc:   Oral   SpO2: 96% 96% 95% 95%  Weight:      Height:        Intake/Output Summary (Last 24 hours) at 12/28/2018 1432 Last data filed at 12/28/2018 1300 Gross per 24 hour  Intake 1832.37 ml  Output 850 ml  Net 982.37 ml   Filed Weights   12/27/18 1604  Weight: 104.3 kg   Gen: 68 y.o. male appearing older than stated age. Pulm: Non-labored on 6L. Clear to auscultation bilaterally.  CV: Regular rate and rhythm. No murmur, rub, or gallop. No JVD, No significant swelling GI: Abdomen soft, non-tender, non-distended, with normoactive bowel sounds. No organomegaly or masses felt. Ext: Warm, left AKA noted Skin: No rashes, lesions or ulcers on visualized skin. Unable to exam sacrum due to pt's failure to cooperate with exam Neuro: Rousable and knows he's in a hospital for an infection and he knows the month and date. Most questions he simply doesn't answer unless it is repeated. No asterixis. No focal neurological deficits on limited exam Psych: Judgement and insight appear mildly impaired.   Data Reviewed: I have personally reviewed following labs and imaging studies  CBC: Recent Labs  Lab 12/27/18 0945 12/27/18 2014 12/28/18 0427  WBC 15.0*  --  12.2*  NEUTROABS 12.0*  --   --   HGB 13.8 13.6 12.4*   HCT 42.0 40.0 38.0*  MCV 81.4  --  85.4  PLT 314  --  440   Basic Metabolic Panel: Recent Labs  Lab 12/27/18 0945 12/27/18 2014 12/28/18 0427  NA 140 142 141  K 5.2* 5.1 5.4*  CL 109  --  116*  CO2 17*  --  18*  GLUCOSE 136*  --  130*  BUN 45*  --  43*  CREATININE 2.88*  --  1.45*  CALCIUM 8.2*  --  7.6*   GFR: Estimated Creatinine Clearance: 55.2 mL/min (A) (by C-G formula based on SCr of 1.45 mg/dL (H)). Liver Function Tests: Recent Labs  Lab 12/27/18 0945 12/28/18 0427  AST 51* 55*  ALT 24 22  ALKPHOS 75 62  BILITOT 1.9* 0.9  PROT 8.2* 6.6  ALBUMIN 2.8* 2.4*   No results for input(s): LIPASE, AMYLASE in the last 168 hours. No results for input(s): AMMONIA in the last 168 hours. Coagulation Profile: No results for input(s): INR, PROTIME in the last 168 hours. Cardiac Enzymes: No results for input(s): CKTOTAL, CKMB, CKMBINDEX, TROPONINI in the last 168 hours. BNP (last 3  results) No results for input(s): PROBNP in the last 8760 hours. HbA1C: No results for input(s): HGBA1C in the last 72 hours. CBG: No results for input(s): GLUCAP in the last 168 hours. Lipid Profile: Recent Labs    12/27/18 0945  TRIG 195*   Thyroid Function Tests: No results for input(s): TSH, T4TOTAL, FREET4, T3FREE, THYROIDAB in the last 72 hours. Anemia Panel: Recent Labs    12/27/18 0945 12/28/18 0427  FERRITIN 416* 307   Urine analysis:    Component Value Date/Time   COLORURINE AMBER (A) 12/27/2018 1045   APPEARANCEUR HAZY (A) 12/27/2018 1045   APPEARANCEUR Clear 11/29/2012 1529   LABSPEC 1.016 12/27/2018 1045   LABSPEC 1.017 11/29/2012 1529   PHURINE 5.0 12/27/2018 1045   GLUCOSEU NEGATIVE 12/27/2018 1045   GLUCOSEU Negative 11/29/2012 1529   HGBUR SMALL (A) 12/27/2018 [place] 12/27/2018 1045   BILIRUBINUR Negative 11/29/2012 1529   KETONESUR NEGATIVE 12/27/2018 1045   PROTEINUR 30 (A) 12/27/2018 1045   NITRITE NEGATIVE 12/27/2018 1045    LEUKOCYTESUR NEGATIVE 12/27/2018 1045   LEUKOCYTESUR 1+ 11/29/2012 1529   Recent Results (from the past 240 hour(s))  Blood Culture (routine x 2)     Status: None (Preliminary result)   Collection Time: 12/27/18  9:43 AM  Result Value Ref Range Status   Specimen Description BLOOD LEFT HAND  Final   Special Requests   Final    BOTTLES DRAWN AEROBIC AND ANAEROBIC Blood Culture adequate volume   Culture   Final    NO GROWTH < 24 HOURS Performed at [facility], [address] [place], [place] [address]    Report Status PENDING  Incomplete  Blood Culture (routine x 2)     Status: None (Preliminary result)   Collection Time: 12/27/18  9:44 AM  Result Value Ref Range Status   Specimen Description BLOOD RIGHT FA  Final   Special Requests   Final    BOTTLES DRAWN AEROBIC AND ANAEROBIC Blood Culture adequate volume   Culture   Final    NO GROWTH < 24 HOURS Performed at [facility], [address] [place], [place] [address]    Report Status PENDING  Incomplete  SARS Coronavirus [facility] order, Performed in [place] hospital lab)     Status: Abnormal   Collection Time: 12/27/18  9:44 AM  Result Value Ref Range Status   SARS Coronavirus 2 POSITIVE (A) NEGATIVE Final    Comment: RESULT CALLED TO, READ BACK BY AND VERIFIED WITH: TOM NAGY AT 9628 ON 12/27/2018 [place]. (NOTE) If result is NEGATIVE SARS-CoV-2 target nucleic acids are NOT DETECTED. The SARS-CoV-2 RNA is generally detectable in upper and lower  respiratory specimens during the acute phase of infection. The lowest  concentration of SARS-CoV-2 viral copies this assay can detect is 250  copies / mL. A negative result does not preclude SARS-CoV-2 infection  and should not be used as the sole basis for treatment or other  patient management decisions.  A negative result may occur with  improper specimen collection / handling, submission of specimen other  than nasopharyngeal swab, presence of viral  mutation(s) within the  areas targeted by this assay, and inadequate number of viral copies  (<250 copies / mL). A negative result must be combined with clinical  observations, patient history, and epidemiological information. If result is POSITIVE SARS-CoV-2 target nucleic acids are DETECTED. Th e SARS-CoV-2 RNA is generally detectable in upper and lower  respiratory specimens during  the acute phase of infection.  Positive  results are indicative of active infection with SARS-CoV-2.  Clinical  correlation with patient history and other diagnostic information is  necessary to determine patient infection status.  Positive results do  not rule out bacterial infection or co-infection with other viruses. If result is PRESUMPTIVE POSTIVE SARS-CoV-2 nucleic acids MAY BE PRESENT.   A presumptive positive result was obtained on the submitted specimen  and confirmed on repeat testing.  While 2019 novel coronavirus  (SARS-CoV-2) nucleic acids may be present in the submitted sample  additional confirmatory testing may be necessary for epidemiological  and / or clinical management purposes  to differentiate between  SARS-CoV-2 and other Sarbecovirus currently known to infect humans.  If clinically indicated additional testing with an alternate test  methodology [phone]) is  advised. The SARS-CoV-2 RNA is generally  detectable in upper and lower respiratory specimens during the acute  phase of infection. The expected result is Negative. Fact Sheet for Patients:  StrictlyIdeas.no Fact Sheet for Healthcare Providers: BankingDealers.co.za This test is not yet approved or cleared by the [place] FDA and has been authorized for detection and/or diagnosis of SARS-CoV-2 by FDA under an Emergency Use Authorization (EUA).  This EUA will remain in effect (meaning this test can be used) for the duration of the COVID-19 declaration under Section 564(b)(1)  of the Act, 21 U.S.C. section 360bbb-3(b)(1), unless the authorization is terminated or revoked sooner. Performed at [facility], [address] [place], [place] [address]   Urine culture     Status: Abnormal (Preliminary result)   Collection Time: 12/27/18 10:38 AM  Result Value Ref Range Status   Specimen Description   Final    URINE, RANDOM Performed at [facility], [address], [place], [place] [address]    Special Requests   Final    NONE Performed at [facility], [address] [place], [place] [address]    Culture (A)  Final    20,000 COLONIES/mL GRAM NEGATIVE RODS CULTURE REINCUBATED FOR BETTER GROWTH Performed at [facility], [place] [address] [place], [place] [address]    Report Status PENDING  Incomplete  MRSA PCR Screening     Status: None   Collection Time: 12/28/18  5:03 AM  Result Value Ref Range Status   MRSA by PCR NEGATIVE NEGATIVE Final    Comment:        The GeneXpert MRSA Assay (FDA approved for NASAL specimens only), is one component of a comprehensive MRSA colonization surveillance program. It is not intended to diagnose MRSA infection nor to guide or monitor treatment for MRSA infections. Performed at [facility], [place] [address] [place], [place] [address]       Radiology Studies: Dg Chest Port 1 View  Result Date: 12/27/2018 CLINICAL DATA:  Difficulty breathing.  Reported COVID-19 positive EXAM: PORTABLE CHEST 1 VIEW COMPARISON:  October 10, 2017 FINDINGS: There is patchy airspace opacity throughout the lungs bilaterally, most notably in the lung bases. There is patchy consolidation in the lung bases. There is fibrosis in each upper lobe which is essentially stable compared to the prior study. Heart size and pulmonary vascularity normal. No adenopathy. No bone lesions. IMPRESSION: Patchy airspace disease in the lungs bilaterally, most notably in the lower lobes where there are is  patchy consolidation. Fibrosis and bullous disease in the upper lobes remains. Stable cardiac silhouette. Note that the findings in the lower lung regions may be consistent with  atypical pneumonia including viral type pneumonitis. Electronically Signed   By: Lowella Grip III M.D.   On: 12/27/2018 09:59    Scheduled Meds: . apixaban  5 mg Oral BID  . aspirin  150 mg Rectal Daily  . atorvastatin  10 mg Oral Daily  . azithromycin  500 mg Oral Q24H  . chlorhexidine  15 mL Mouth Rinse BID  . furosemide  40 mg Oral Daily  . LORazepam  0.5 mg Oral QHS  . mouth rinse  15 mL Mouth Rinse q12n4p  . methylPREDNISolone (SOLU-MEDROL) injection  60 mg Intravenous Q6H  . sodium zirconium cyclosilicate  10 g Oral Daily   Continuous Infusions: . sodium chloride 75 mL/hr at 12/28/18 1113  . sodium chloride 10 mL/hr at 12/28/18 0259  . cefTRIAXone (ROCEPHIN)  IV 1 g (12/28/18 1100)     LOS: 1 day   Time spent: 35 minutes.  Patrecia Pour, MD [facility] [email or web address] Password [facility] 12/28/2018, 2:32 PM

## 2018-12-28 NOTE — Consult Note (Signed)
WOC reviewed chart for images, not taken per primary MD. Will contact staff to discuss wound.   Timothy Juarez, [place], [place]

## 2018-12-28 NOTE — Plan of Care (Signed)
Patient stable, discussed POC with patient and updated daughter Elmo Putt via telephone, agreeable with plan, denies question/concerns at this time.

## 2018-12-28 NOTE — Consult Note (Signed)
Requested images to be placed in the chart of pressure injury. Noted to be on the sacrum and present on admission. Will complete consultation electronically with images and chart review once placed in the record.   [place], [place], [place]

## 2018-12-28 NOTE — Progress Notes (Signed)
Patient's mentation improved overnight. Now alert and orientedx3. Answers questions appropriately. Maintaining  sats @ 94% on 6L [place]. VSS.

## 2018-12-28 NOTE — Evaluation (Signed)
Clinical/Bedside Swallow Evaluation Patient Details  Name: Timothy Juarez MRN: [id number] Date of Birth: February 14, 1951  Today's Date: 12/28/2018 Time: SLP Start Time (ACUTE ONLY): 1555 SLP Stop Time (ACUTE ONLY): 1610 SLP Time Calculation (min) (ACUTE ONLY): 15 min  Past Medical History:  Past Medical History:  Diagnosis Date  . Acute embolism and thombos unsp deep vn unsp lower extremity ([place])   . Acute respiratory failure ([place])   . Allergy   . Anemia   . Aneurysm of unspecified site ([place])   . ARF (acute respiratory failure) ([place])    H/O  . Bronchitis   . CHF (congestive heart failure) ([place])   . COPD (chronic obstructive pulmonary disease) ([place])   . Cough   . Epistaxis   . GERD (gastroesophageal reflux disease)   . Gout   . Hyperlipidemia   . Hypertension   . Hypokalemia   . Insomnia   . Muscle contracture    MUSCLE SPASMS, muscle weakness  . Peripheral vascular disease ([place])   . Pneumonia   . Pressure ulcer    Past Surgical History:  Past Surgical History:  Procedure Laterality Date  . AMPUTATION Left 09/19/2015   Procedure: AMPUTATION BELOW KNEE;  Surgeon: Algernon Huxley, MD;  Location: [facility] ORS;  Service: Vascular;  Laterality: Left;  . AMPUTATION Left 11/01/2015   Procedure: AMPUTATION ABOVE KNEE;  Surgeon: Algernon Huxley, MD;  Location: [facility] ORS;  Service: Vascular;  Laterality: Left;  . APPLICATION OF WOUND VAC Left 10/18/2015   Procedure: APPLICATION OF WOUND VAC;  Surgeon: Algernon Huxley, MD;  Location: [facility] ORS;  Service: Vascular;  Laterality: Left;  . COLONOSCOPY WITH PROPOFOL N/A 05/25/2017   Procedure: COLONOSCOPY WITH PROPOFOL;  Surgeon: Jonathon Bellows, MD;  Location: [facility] ENDOSCOPY;  Service: Gastroenterology;  Laterality: N/A;  . COLONOSCOPY WITH PROPOFOL N/A 10/14/2017   Procedure: COLONOSCOPY WITH PROPOFOL;  Surgeon: Jonathon Bellows, MD;  Location: [facility] ENDOSCOPY;  Service: Gastroenterology;  Laterality: N/A;  . ESOPHAGOGASTRODUODENOSCOPY (EGD) WITH PROPOFOL N/A 05/25/2017    Procedure: ESOPHAGOGASTRODUODENOSCOPY (EGD) WITH PROPOFOL;  Surgeon: Jonathon Bellows, MD;  Location: [facility] ENDOSCOPY;  Service: Gastroenterology;  Laterality: N/A;  . ESOPHAGOGASTRODUODENOSCOPY (EGD) WITH PROPOFOL N/A 10/14/2017   Procedure: ESOPHAGOGASTRODUODENOSCOPY (EGD) WITH PROPOFOL;  Surgeon: Jonathon Bellows, MD;  Location: [facility] ENDOSCOPY;  Service: Gastroenterology;  Laterality: N/A;  . GIVENS CAPSULE STUDY N/A 07/14/2017   Procedure: GIVENS CAPSULE STUDY;  Surgeon: Jonathon Bellows, MD;  Location: [facility] ENDOSCOPY;  Service: Gastroenterology;  Laterality: N/A;  . GIVENS CAPSULE STUDY N/A 10/30/2017   Procedure: GIVENS CAPSULE STUDY 12 HR;  Surgeon: Jonathon Bellows, MD;  Location: [facility] ENDOSCOPY;  Service: Gastroenterology;  Laterality: N/A;  . IVC FILTER INSERTION N/A 10/12/2017   Procedure: IVC FILTER INSERTION;  Surgeon: Algernon Huxley, MD;  Location: [place] CV LAB;  Service: Cardiovascular;  Laterality: N/A;  . LOWER EXTREMITY ANGIOGRAPHY Right 10/04/2018   Procedure: LOWER EXTREMITY ANGIOGRAPHY;  Surgeon: Algernon Huxley, MD;  Location: [place] CV LAB;  Service: Cardiovascular;  Laterality: Right;  . PERIPHERAL VASCULAR CATHETERIZATION Left 01/18/2015   Procedure: Lower Extremity Angiography;  Surgeon: Algernon Huxley, MD;  Location: [place] CV LAB;  Service: Cardiovascular;  Laterality: Left;  . PERIPHERAL VASCULAR CATHETERIZATION N/A 01/18/2015   Procedure: Lower Extremity Intervention;  Surgeon: Algernon Huxley, MD;  Location: [place] CV LAB;  Service: Cardiovascular;  Laterality: N/A;  . PERIPHERAL VASCULAR CATHETERIZATION  07/30/2015   Procedure: Lower Extremity Intervention;  Surgeon: Algernon Huxley, MD;  Location: [place] CV LAB;  Service: Cardiovascular;;  . PERIPHERAL VASCULAR CATHETERIZATION N/A 07/30/2015   Procedure: Abdominal Aortogram w/Lower Extremity;  Surgeon: Algernon Huxley, MD;  Location: [place] CV LAB;  Service: Cardiovascular;  Laterality: N/A;  . PERIPHERAL VASCULAR  CATHETERIZATION Left 08/22/2015   Procedure: Lower Extremity Angiography;  Surgeon: Algernon Huxley, MD;  Location: [place] CV LAB;  Service: Cardiovascular;  Laterality: Left;  . PERIPHERAL VASCULAR CATHETERIZATION Left 08/22/2015   Procedure: Lower Extremity Intervention;  Surgeon: Algernon Huxley, MD;  Location: [place] CV LAB;  Service: Cardiovascular;  Laterality: Left;  . PERIPHERAL VASCULAR CATHETERIZATION Right 03/31/2016   Procedure: Lower Extremity Angiography;  Surgeon: Algernon Huxley, MD;  Location: [place] CV LAB;  Service: Cardiovascular;  Laterality: Right;  . PERIPHERAL VASCULAR CATHETERIZATION  03/31/2016   Procedure: Lower Extremity Intervention;  Surgeon: Algernon Huxley, MD;  Location: [place] CV LAB;  Service: Cardiovascular;;  . PERIPHERAL VASCULAR CATHETERIZATION Left 04/10/2016   Procedure: Lower Extremity Angiography;  Surgeon: Algernon Huxley, MD;  Location: [place] CV LAB;  Service: Cardiovascular;  Laterality: Left;  . WOUND DEBRIDEMENT Left 10/18/2015   Procedure: DEBRIDEMENT WOUND   ( LEFT BKA DEBRIDEMENT );  Surgeon: Algernon Huxley, MD;  Location: [facility] ORS;  Service: Vascular;  Laterality: Left;   HPI:  Pt is a 68 yo male presenting from [facility] due to worsening dyspnea and AMS after testing positive for COVID-19 a week prior. PMH includes: PNA, pressure ulcer, muscle contracture, insomnia, HTN, HLD, gout, GERD, COPD, CHF   Assessment / Plan / Recommendation Clinical Impression  Pt needed moderate encouragement to take any POs, with slow oral preparation when he did. He had a single cough across intake. Swallowing otherwise appeared functional, making it possible that this is more related to his baseline cough, which was noted prior to PO intake. Pt's mentation has reportedly improved since previous date. Recommend starting Dys 3 diet and thin liquids, with SLP f/u warranted for tolerance. SLP Visit Diagnosis: Dysphagia, unspecified (R13.10)    Aspiration  Risk  Mild aspiration risk    Diet Recommendation Dysphagia 3 (Mech soft);Thin liquid   Liquid Administration via: Cup;Straw Medication Administration: Whole meds with puree Supervision: Patient able to self feed;Intermittent supervision to cue for compensatory strategies(may need increased supervision to facilitate intake) Compensations: Minimize environmental distractions;Slow rate;Small sips/bites Postural Changes: Seated upright at 90 degrees;Remain upright for at least 30 minutes after po intake    Other  Recommendations Oral Care Recommendations: Oral care BID   Follow up Recommendations Skilled Nursing facility      Frequency and Duration min 2x/week  2 weeks       Prognosis Prognosis for Safe Diet Advancement: Good Barriers to Reach Goals: Other (Comment)(unknown PLOF)      Swallow Study   General HPI: Pt is a 68 yo male presenting from [facility] due to worsening dyspnea and AMS after testing positive for COVID-19 a week prior. PMH includes: PNA, pressure ulcer, muscle contracture, insomnia, HTN, HLD, gout, GERD, COPD, CHF Type of Study: Bedside Swallow Evaluation Previous Swallow Assessment: none in chart Diet Prior to this Study: NPO Temperature Spikes Noted: No Respiratory Status: Nasal cannula(HFNC) History of Recent Intubation: No Behavior/Cognition: Alert;Requires cueing Oral Cavity Assessment: Within Functional Limits Oral Care Completed by SLP: No Oral Cavity - Dentition: Missing dentition;Poor condition Vision: Functional for self-feeding Self-Feeding Abilities: Needs set up;Able to feed self Patient Positioning: Upright in bed Baseline Vocal  Quality: Normal Volitional Swallow: Able to elicit    Oral/Motor/Sensory Function Overall Oral Motor/Sensory Function: (assessment limited by participation vs command following)   Ice Chips Ice chips: Within functional limits Presentation: Spoon   Thin Liquid Thin Liquid: Impaired Presentation: Self  Fed;Straw;Cup Pharyngeal  Phase Impairments: Cough - Immediate(x1)    Nectar Thick Nectar Thick Liquid: Not tested   Honey Thick Honey Thick Liquid: Not tested   Puree Puree: Within functional limits Presentation: Self Fed;Spoon   Solid     Solid: Impaired Presentation: Self Fed Oral Phase Impairments: Impaired mastication      Timothy Juarez Timothy Juarez 12/28/2018,5:59 PM   Pollyann Glen, M.A. [place] Acute Environmental education officer [phone] Office [phone]

## 2018-12-29 DIAGNOSIS — A4189 Other specified sepsis: Secondary | ICD-10-CM | POA: Diagnosis not present

## 2018-12-29 DIAGNOSIS — G92 Toxic encephalopathy: Secondary | ICD-10-CM | POA: Diagnosis not present

## 2018-12-29 DIAGNOSIS — I82512 Chronic embolism and thrombosis of left femoral vein: Secondary | ICD-10-CM | POA: Diagnosis not present

## 2018-12-29 DIAGNOSIS — N179 Acute kidney failure, unspecified: Secondary | ICD-10-CM | POA: Diagnosis not present

## 2018-12-29 DIAGNOSIS — U071 COVID-19: Secondary | ICD-10-CM | POA: Diagnosis not present

## 2018-12-29 DIAGNOSIS — J1289 Other viral pneumonia: Secondary | ICD-10-CM | POA: Diagnosis not present

## 2018-12-29 DIAGNOSIS — I10 Essential (primary) hypertension: Secondary | ICD-10-CM | POA: Diagnosis not present

## 2018-12-29 DIAGNOSIS — L89153 Pressure ulcer of sacral region, stage 3: Secondary | ICD-10-CM | POA: Diagnosis not present

## 2018-12-29 DIAGNOSIS — J9621 Acute and chronic respiratory failure with hypoxia: Secondary | ICD-10-CM | POA: Diagnosis not present

## 2018-12-29 DIAGNOSIS — J181 Lobar pneumonia, unspecified organism: Secondary | ICD-10-CM | POA: Diagnosis not present

## 2018-12-29 LAB — CBC WITH DIFFERENTIAL/PLATELET
Abs Immature Granulocytes: 0.37 10*3/uL — ABNORMAL HIGH (ref 0.00–0.07)
Basophils Absolute: 0.1 10*3/uL (ref 0.0–0.1)
Basophils Relative: 0 %
Eosinophils Absolute: 0 10*3/uL (ref 0.0–0.5)
Eosinophils Relative: 0 %
HCT: 39.6 % (ref 39.0–52.0)
Hemoglobin: 12.7 g/dL — ABNORMAL LOW (ref 13.0–17.0)
Immature Granulocytes: 2 %
Lymphocytes Relative: 8 %
Lymphs Abs: 1.3 10*3/uL (ref 0.7–4.0)
MCH: 27 pg (ref 26.0–34.0)
MCHC: 32.1 g/dL (ref 30.0–36.0)
MCV: 84.1 fL (ref 80.0–100.0)
Monocytes Absolute: 0.7 10*3/uL (ref 0.1–1.0)
Monocytes Relative: 4 %
Neutro Abs: 13.8 10*3/uL — ABNORMAL HIGH (ref 1.7–7.7)
Neutrophils Relative %: 86 %
Platelets: 409 10*3/uL — ABNORMAL HIGH (ref 150–400)
RBC: 4.71 MIL/uL (ref 4.22–5.81)
RDW: 17.2 % — ABNORMAL HIGH (ref 11.5–15.5)
WBC: 16.2 10*3/uL — ABNORMAL HIGH (ref 4.0–10.5)
nRBC: 0 % (ref 0.0–0.2)

## 2018-12-29 LAB — COMPREHENSIVE METABOLIC PANEL
ALT: 20 U/L (ref 0–44)
AST: 32 U/L (ref 15–41)
Albumin: 2.3 g/dL — ABNORMAL LOW (ref 3.5–5.0)
Alkaline Phosphatase: 66 U/L (ref 38–126)
Anion gap: 11 (ref 5–15)
BUN: 28 mg/dL — ABNORMAL HIGH (ref 8–23)
CO2: 18 mmol/L — ABNORMAL LOW (ref 22–32)
Calcium: 7.9 mg/dL — ABNORMAL LOW (ref 8.9–10.3)
Chloride: 115 mmol/L — ABNORMAL HIGH (ref 98–111)
Creatinine, Ser: 0.96 mg/dL (ref 0.61–1.24)
GFR calc Af Amer: >60 mL/min (ref >60)
GFR calc non Af Amer: >60 mL/min (ref >60)
Glucose, Bld: 121 mg/dL — ABNORMAL HIGH (ref 70–99)
Potassium: 4.6 mmol/L (ref 3.5–5.1)
Sodium: 144 mmol/L (ref 135–145)
Total Bilirubin: 0.7 mg/dL (ref 0.3–1.2)
Total Protein: 6.7 g/dL (ref 6.5–8.1)

## 2018-12-29 LAB — HIV ANTIBODY (ROUTINE TESTING W REFLEX): HIV Screen 4th Generation wRfx: NONREACTIVE

## 2018-12-29 LAB — C-REACTIVE PROTEIN: CRP: 6.1 mg/dL — ABNORMAL HIGH (ref <1.0)

## 2018-12-29 LAB — D-DIMER, QUANTITATIVE: D-Dimer, Quant: 3.24 ug/mL-FEU — ABNORMAL HIGH (ref 0.00–0.50)

## 2018-12-29 LAB — FERRITIN: Ferritin: 270 ng/mL (ref 24–336)

## 2018-12-29 MED ORDER — TAMSULOSIN HCL 0.4 MG PO CAPS
0.4000 mg | ORAL_CAPSULE | Freq: Every day | ORAL | Status: DC
  Administered 2018-12-29 – 2019-01-03 (×6): 0.4 mg via ORAL
  Filled 2018-12-29 (×6): qty 1

## 2018-12-29 MED ORDER — UMECLIDINIUM BROMIDE 62.5 MCG/INH IN AEPB
1.0000 | INHALATION_SPRAY | Freq: Every day | RESPIRATORY_TRACT | Status: DC
  Administered 2018-12-29 – 2019-01-04 (×6): 1 via RESPIRATORY_TRACT
  Filled 2018-12-29: qty 7

## 2018-12-29 MED ORDER — ASPIRIN EC 81 MG PO TBEC
81.0000 mg | DELAYED_RELEASE_TABLET | Freq: Every day | ORAL | Status: DC
  Administered 2018-12-29 – 2019-01-04 (×7): 81 mg via ORAL
  Filled 2018-12-29 (×7): qty 1

## 2018-12-29 MED ORDER — FLUTICASONE FUROATE-VILANTEROL 100-25 MCG/INH IN AEPB
1.0000 | INHALATION_SPRAY | Freq: Every day | RESPIRATORY_TRACT | Status: DC
  Administered 2018-12-29 – 2019-01-04 (×6): 1 via RESPIRATORY_TRACT
  Filled 2018-12-29: qty 28

## 2018-12-29 MED ORDER — GERHARDT'S BUTT CREAM
TOPICAL_CREAM | Freq: Two times a day (BID) | CUTANEOUS | Status: DC
  Administered 2018-12-29 – 2019-01-04 (×13): via TOPICAL
  Filled 2018-12-29: qty 1

## 2018-12-29 MED ORDER — FLUTICASONE-UMECLIDIN-VILANT 100-62.5-25 MCG/INH IN AEPB: 1.0000 | INHALATION_SPRAY | Freq: Every day | RESPIRATORY_TRACT | Status: DC

## 2018-12-29 NOTE — Progress Notes (Signed)
  Speech Language Pathology Treatment: Dysphagia;Cognitive-Linquistic  Patient Details Name: Timothy Juarez MRN: [id number] DOB: 03-31-1951 Today's Date: 12/29/2018 Time: 3244-0102 SLP Time Calculation (min) (ACUTE ONLY): 24 min  Assessment / Plan / Recommendation Clinical Impression  Pt is mildly more interactive wtih SLP this morning, responding to questions and demonstrating orientation x4. His spontaneous verbal output is still limited, but question how near that is to his baseline. Verbal request was made spontaneously x1. He did not want much of his breakfast, but consumed bites and sips as well as meds whole in puree without overt difficulty. RR primarily remained in the low 20s during intake, and he coughed x2 only after PO intake had stopped. Would continue current diet and precautions for now.   HPI HPI: Pt is a 68 yo male presenting from [facility] due to worsening dyspnea and AMS after testing positive for COVID-19 a week prior. PMH includes: PNA, pressure ulcer, muscle contracture, insomnia, HTN, HLD, gout, GERD, COPD, CHF      SLP Plan  Continue with current plan of care       Recommendations  Diet recommendations: Dysphagia 3 (mechanical soft);Thin liquid Liquids provided via: Cup;Straw Medication Administration: Whole meds with puree Supervision: Patient able to self feed;Intermittent supervision to cue for compensatory strategies Compensations: Minimize environmental distractions;Slow rate;Small sips/bites Postural Changes and/or Swallow Maneuvers: Seated upright 90 degrees;Upright 30-60 min after meal                Oral Care Recommendations: Oral care BID Follow up Recommendations: Skilled Nursing facility SLP Visit Diagnosis: Cognitive communication deficit (R41.841);Dysphagia, unspecified (R13.10) Plan: Continue with current plan of care       [place] Asusena Sigley 12/29/2018, 9:16 AM  Pollyann Glen, M.A. [place] Acute Art therapist [phone] Office [phone]

## 2018-12-29 NOTE — Progress Notes (Signed)
Pt's QTC increased to 568; some bradycardia @ 47; P84; BP161/76. MD Notified and recommended reporting to Day nurse so it can be addressed.

## 2018-12-29 NOTE — Progress Notes (Signed)
Spoke to patient's daugher in [employer], gave update.  Daughter in [employer].  Son to call and speak with patient later.

## 2018-12-29 NOTE — Progress Notes (Signed)
PROGRESS NOTE  Timothy Juarez  DDU:[id number] DOB: [birth year] DOA: 12/27/2018 PCP: Alvester Morin, MD   Brief Narrative: Timothy Juarez is a 68 y.o. male with a history of HFpEF, COPD, recurrent DVTs on eliquis, PVD s/p left AKA, and anemia who presented from [place] facility due to shortness of breath. He had tested positive for covid-19 infection a week prior and developed dyspnea 5/11 prompting evaluation at [facility] where he was found to be hypoxic and encephalopathic. CXR showed bilateral infiltrates and WBC was elevated with left shift and elevation of inflammatory markers. Initially put on 10L of oxygen this was quickly weaned to 6L O2. Ceftriaxone and azithromycin were given, cultures taken and he was admitted to [facility] where steroids and actemra were given.   Assessment & Plan: Active Problems:   PAD (peripheral artery disease) (HCC)   Pressure ulcer   Essential hypertension   Symptomatic anemia   Chronic deep vein thrombosis (DVT) of left lower extremity (HCC)   Hx of AKA (above knee amputation), left (HCC)   Acute on chronic respiratory failure with hypoxia ([place])   Community acquired bilateral lower lobe pneumonia ([place])   COVID-19 virus infection   High anion gap metabolic acidosis   Severe sepsis ([place])   AKI (acute kidney injury) ([place])   Toxic encephalopathy   Acute respiratory failure with hypoxia ([place])  Acute respiratory failure due to CAP and covid-19 infection: At very high risk of acute decompensation. ABG from yesterday evening showed compensatory hyperventilation and hypoxia.  - Continue airborne, contact precautions.   - Check daily labs: CBC w/diff, CMP, d-dimer, fibrinogen, ferritin, LDH, CRP - Blood cultures drawn, NGTD.  - Avoid NSAIDs - Continue steroids. Wheezing at admission, improved now.  - Recommend proning and aggressive use of incentive spirometry. - Given actemra at 800mg  on admission, continues to require 6L O2, will repeat  dose. He is not known to be on immunomodulators, anti-rejection medications, or cancer chemotherapy, Platelets are 342 (>50k), ANC is 12k (>500), and ALT/AST are well below 5x ULN with no known hepatitis B infection or TB history. The investigational nature of this medication was discussed with the patient and at length with his daughter today. They choose to proceed as the potential benefits are felt to outweigh risks at this time. Will monitor LFTs and for infusion reaction. - Goals of care were discussed. I have shared that his prognosis is guarded.  Sepsis due to CAP and covid-19 infection:  - Continue IVF  - Monitor cultures - Medications as above   Acute toxic/metabolic encephalopathy:  - Continue Tx of infection as above - Holding home sedating medications - Delirium precautions   AKI: Suspect hemodynamically mediated and possible obstructive process.  Improved with IV fluids and Foley catheter placement.  He has good urine output.  Will start on Flomax.  Can consider voiding trial next 24 to 48 hours.   Hyperkalemia: Mild, due to AKI. No ECG changes. -Improved with Lokelma  Lactic acidosis: Due to hypoxia and sepsis.   Mildly elevated LFTs: Bilirubin has normalized - Continue monitoring.  Morbid obesity: BMI is 37, though this is calculated in the absence of lower leg s/p AKA.  - Weight loss recommended.  - Seen by speech therapy with recommendations for dysphagia 3 diet.  Will consult nutrition, albumin noted to be low  Stage III sacral pressure injury: Noted wound going back to 2017.  - WOC consulted, appreciate input  PAD, hx left AKA:  - Noted, stump  site uninfected  History of DVT:  - Continue anticoagulation with eliquis  HTN:  - Monitor  DVT prophylaxis: Eliquis Code Status: Full Family Communication: None Disposition Plan: Pending clinical course.  Consultants:   None  Procedures:   Actemra given 5/11  Antimicrobials:  Ceftriaxone, azithromycin    Subjective: Patient is sitting up in bed for breakfast.  Denies any worsening shortness of breath.  Objective: Vitals:   12/29/18 1000 12/29/18 1235 12/29/18 1400 12/29/18 1626  BP:  (!) 159/75  (!) 153/78  Pulse:    86  Resp: 18  (!) 21 (!) 23  Temp:  98.7 F (37.1 C)  98.6 F (37 C)  TempSrc:    Oral  SpO2:   90% 91%  Weight:      Height:        Intake/Output Summary (Last 24 hours) at 12/29/2018 1910 Last data filed at 12/29/2018 1800 Gross per 24 hour  Intake 1710 ml  Output 1000 ml  Net 710 ml   Filed Weights   12/27/18 1604  Weight: 104.3 kg   General exam: Alert, awake, no distress Respiratory system: Clear to auscultation. Respiratory effort normal. Cardiovascular system:RRR. No murmurs, rubs, gallops. Gastrointestinal system: Abdomen is nondistended, soft and nontender. No organomegaly or masses felt. Normal bowel sounds heard. Central nervous system: No focal neurological deficits. Extremities: Left AKA, right lower extremity with mild pedal edema Skin: No rashes, lesions or ulcers Psychiatry: Appears mildly confused   Data Reviewed: I have personally reviewed following labs and imaging studies  CBC: Recent Labs  Lab 12/27/18 0945 12/27/18 2014 12/28/18 0427 12/29/18 0500  WBC 15.0*  --  12.2* 16.2*  NEUTROABS 12.0*  --   --  13.8*  HGB 13.8 13.6 12.4* 12.7*  HCT 42.0 40.0 38.0* 39.6  MCV 81.4  --  85.4 84.1  PLT 314  --  342 366*   Basic Metabolic Panel: Recent Labs  Lab 12/27/18 0945 12/27/18 2014 12/28/18 0427 12/29/18 0500  NA 140 142 141 144  K 5.2* 5.1 5.4* 4.6  CL 109  --  116* 115*  CO2 17*  --  18* 18*  GLUCOSE 136*  --  130* 121*  BUN 45*  --  43* 28*  CREATININE 2.88*  --  1.45* 0.96  CALCIUM 8.2*  --  7.6* 7.9*   GFR: Estimated Creatinine Clearance: 83.3 mL/min (by C-G formula based on SCr of 0.96 mg/dL). Liver Function Tests: Recent Labs  Lab 12/27/18 0945 12/28/18 0427 12/29/18 0500  AST 51* 55* 32  ALT 24 22 20    ALKPHOS 75 62 66  BILITOT 1.9* 0.9 0.7  PROT 8.2* 6.6 6.7  ALBUMIN 2.8* 2.4* 2.3*   No results for input(s): LIPASE, AMYLASE in the last 168 hours. No results for input(s): AMMONIA in the last 168 hours. Coagulation Profile: No results for input(s): INR, PROTIME in the last 168 hours. Cardiac Enzymes: No results for input(s): CKTOTAL, CKMB, CKMBINDEX, TROPONINI in the last 168 hours. BNP (last 3 results) No results for input(s): PROBNP in the last 8760 hours. HbA1C: No results for input(s): HGBA1C in the last 72 hours. CBG: No results for input(s): GLUCAP in the last 168 hours. Lipid Profile: Recent Labs    12/27/18 0945  TRIG 195*   Thyroid Function Tests: No results for input(s): TSH, T4TOTAL, FREET4, T3FREE, THYROIDAB in the last 72 hours. Anemia Panel: Recent Labs    12/28/18 0427 12/29/18 0500  FERRITIN 307 270   Urine analysis:  Component Value Date/Time   COLORURINE AMBER (A) 12/27/2018 1045   APPEARANCEUR HAZY (A) 12/27/2018 1045   APPEARANCEUR Clear 11/29/2012 1529   LABSPEC 1.016 12/27/2018 1045   LABSPEC 1.017 11/29/2012 1529   PHURINE 5.0 12/27/2018 1045   GLUCOSEU NEGATIVE 12/27/2018 1045   GLUCOSEU Negative 11/29/2012 1529   HGBUR SMALL (A) 12/27/2018 1045   BILIRUBINUR NEGATIVE 12/27/2018 1045   BILIRUBINUR Negative 11/29/2012 1529   KETONESUR NEGATIVE 12/27/2018 1045   PROTEINUR 30 (A) 12/27/2018 1045   NITRITE NEGATIVE 12/27/2018 [place] 12/27/2018 1045   LEUKOCYTESUR 1+ 11/29/2012 1529   Recent Results (from the past 240 hour(s))  Blood Culture (routine x 2)     Status: None (Preliminary result)   Collection Time: 12/27/18  9:43 AM  Result Value Ref Range Status   Specimen Description BLOOD LEFT HAND  Final   Special Requests   Final    BOTTLES DRAWN AEROBIC AND ANAEROBIC Blood Culture adequate volume   Culture   Final    NO GROWTH 2 DAYS Performed at [facility], [address], [place], [place]  [address]    Report Status PENDING  Incomplete  Blood Culture (routine x 2)     Status: None (Preliminary result)   Collection Time: 12/27/18  9:44 AM  Result Value Ref Range Status   Specimen Description BLOOD RIGHT FA  Final   Special Requests   Final    BOTTLES DRAWN AEROBIC AND ANAEROBIC Blood Culture adequate volume   Culture   Final    NO GROWTH 2 DAYS Performed at [facility], [address], [place], [place] [address]    Report Status PENDING  Incomplete  SARS Coronavirus 2 [facility] order, Performed in [place] hospital lab)     Status: Abnormal   Collection Time: 12/27/18  9:44 AM  Result Value Ref Range Status   SARS Coronavirus 2 POSITIVE (A) NEGATIVE Final    Comment: RESULT CALLED TO, READ BACK BY AND VERIFIED WITH: TOM NAGY AT 0350 ON 12/27/2018 [place]. (NOTE) If result is NEGATIVE SARS-CoV-2 target nucleic acids are NOT DETECTED. The SARS-CoV-2 RNA is generally detectable in upper and lower  respiratory specimens during the acute phase of infection. The lowest  concentration of SARS-CoV-2 viral copies this assay can detect is 250  copies / mL. A negative result does not preclude SARS-CoV-2 infection  and should not be used as the sole basis for treatment or other  patient management decisions.  A negative result may occur with  improper specimen collection / handling, submission of specimen other  than nasopharyngeal swab, presence of viral mutation(s) within the  areas targeted by this assay, and inadequate number of viral copies  (<250 copies / mL). A negative result must be combined with clinical  observations, patient history, and epidemiological information. If result is POSITIVE SARS-CoV-2 target nucleic acids are DETECTED. Th e SARS-CoV-2 RNA is generally detectable in upper and lower  respiratory specimens during the acute phase of infection.  Positive  results are indicative of active infection with SARS-CoV-2.  Clinical  correlation with patient  history and other diagnostic information is  necessary to determine patient infection status.  Positive results do  not rule out bacterial infection or co-infection with other viruses. If result is PRESUMPTIVE POSTIVE SARS-CoV-2 nucleic acids MAY BE PRESENT.   A presumptive positive result was obtained on the submitted specimen  and confirmed on repeat testing.  While 2019 novel coronavirus  (SARS-CoV-2) nucleic acids  may be present in the submitted sample  additional confirmatory testing may be necessary for epidemiological  and / or clinical management purposes  to differentiate between  SARS-CoV-2 and other Sarbecovirus currently known to infect humans.  If clinically indicated additional testing with an alternate test  methodology [phone]) is  advised. The SARS-CoV-2 RNA is generally  detectable in upper and lower respiratory specimens during the acute  phase of infection. The expected result is Negative. Fact Sheet for Patients:  StrictlyIdeas.no Fact Sheet for Healthcare Providers: BankingDealers.co.za This test is not yet approved or cleared by the [place] FDA and has been authorized for detection and/or diagnosis of SARS-CoV-2 by FDA under an Emergency Use Authorization (EUA).  This EUA will remain in effect (meaning this test can be used) for the duration of the COVID-19 declaration under Section 564(b)(1) of the Act, 21 U.S.C. section 360bbb-3(b)(1), unless the authorization is terminated or revoked sooner. Performed at [facility], [address], [place], [place] [address]   Urine culture     Status: Abnormal (Preliminary result)   Collection Time: 12/27/18 10:38 AM  Result Value Ref Range Status   Specimen Description   Final    URINE, RANDOM Performed at [facility], [address] [place], [place] [address]    Special Requests   Final    NONE Performed at [facility], [address] [place], [place] [address]    Culture (A)  Final    20,000 COLONIES/mL GRAM NEGATIVE RODS CULTURE REINCUBATED FOR BETTER GROWTH Performed at [facility], [place] [address], [place], [place] [address]    Report Status PENDING  Incomplete  MRSA PCR Screening     Status: None   Collection Time: 12/28/18  5:03 AM  Result Value Ref Range Status   MRSA by PCR NEGATIVE NEGATIVE Final    Comment:        The GeneXpert MRSA Assay (FDA approved for NASAL specimens only), is one component of a comprehensive MRSA colonization surveillance program. It is not intended to diagnose MRSA infection nor to guide or monitor treatment for MRSA infections. Performed at [facility], [place] [address] [place], [place] [address]       Radiology Studies: No results found.  Scheduled Meds: . apixaban  5 mg Oral BID  . aspirin EC  81 mg Oral Daily  . atorvastatin  10 mg Oral Daily  . chlorhexidine  15 mL Mouth Rinse BID  . Gerhardt's butt cream   Topical BID  . mouth rinse  15 mL Mouth Rinse q12n4p  . methylPREDNISolone (SOLU-MEDROL) injection  40 mg Intravenous Q6H  . sodium zirconium cyclosilicate  10 g Oral Daily  . tamsulosin  0.4 mg Oral QPC supper   Continuous Infusions: . sodium chloride Stopped (12/28/18 1825)  . cefTRIAXone (ROCEPHIN)  IV Stopped (12/29/18 1015)     LOS: 2 days   Time spent: 35 minutes.  Kathie Dike, MD [facility] [email or web address] Password [email or web address] 12/29/2018, 7:10 PM

## 2018-12-29 NOTE — Consult Note (Signed)
[place] Nurse wound consult note Assisted with remote consult via camera with Shanon Brow, bedside RN.  No photo available in chart.  Reason for Consult:Full thickness moisture associated skin damage to upper buttocks near apex gluteal cleft.  Present on admission.  Tissue loss is on both gluteal folds and not located over the coccyx or bony prominence.   Wound type: moisture associated skin damage Pressure Injury POA: Yes Measurement: 2 cm x 2 cm x 0.2 cm  Wound QVL:DKCC pink nongranulating Drainage (amount, consistency, odor) minimal serosanguinous  No odor Periwound: maceration Dressing procedure/placement/frequency: Cleanse buttock wounds with soap and water and pat dry.  Apply gerhardts butt paste twice daily and PRN soilage.   Will not follow at this time.  Please re-consult if needed.  Domenic Moras MSN, RN, FNP-BC CWON Wound, Ostomy, Continence Nurse Pager [phone]

## 2018-12-30 DIAGNOSIS — L89153 Pressure ulcer of sacral region, stage 3: Secondary | ICD-10-CM | POA: Diagnosis not present

## 2018-12-30 DIAGNOSIS — N179 Acute kidney failure, unspecified: Secondary | ICD-10-CM | POA: Diagnosis not present

## 2018-12-30 DIAGNOSIS — A4189 Other specified sepsis: Secondary | ICD-10-CM | POA: Diagnosis not present

## 2018-12-30 DIAGNOSIS — I82512 Chronic embolism and thrombosis of left femoral vein: Secondary | ICD-10-CM | POA: Diagnosis not present

## 2018-12-30 DIAGNOSIS — I739 Peripheral vascular disease, unspecified: Secondary | ICD-10-CM | POA: Diagnosis not present

## 2018-12-30 DIAGNOSIS — G92 Toxic encephalopathy: Secondary | ICD-10-CM | POA: Diagnosis not present

## 2018-12-30 DIAGNOSIS — J1289 Other viral pneumonia: Secondary | ICD-10-CM | POA: Diagnosis not present

## 2018-12-30 DIAGNOSIS — Z89612 Acquired absence of left leg above knee: Secondary | ICD-10-CM | POA: Diagnosis not present

## 2018-12-30 DIAGNOSIS — U071 COVID-19: Secondary | ICD-10-CM | POA: Diagnosis not present

## 2018-12-30 DIAGNOSIS — J9621 Acute and chronic respiratory failure with hypoxia: Secondary | ICD-10-CM | POA: Diagnosis not present

## 2018-12-30 LAB — COMPREHENSIVE METABOLIC PANEL
ALT: 19 U/L (ref 0–44)
AST: 26 U/L (ref 15–41)
Albumin: 2.5 g/dL — ABNORMAL LOW (ref 3.5–5.0)
Alkaline Phosphatase: 69 U/L (ref 38–126)
Anion gap: 11 (ref 5–15)
BUN: 21 mg/dL (ref 8–23)
CO2: 19 mmol/L — ABNORMAL LOW (ref 22–32)
Calcium: 7.9 mg/dL — ABNORMAL LOW (ref 8.9–10.3)
Chloride: 112 mmol/L — ABNORMAL HIGH (ref 98–111)
Creatinine, Ser: 0.83 mg/dL (ref 0.61–1.24)
GFR calc Af Amer: >60 mL/min (ref >60)
GFR calc non Af Amer: >60 mL/min (ref >60)
Glucose, Bld: 137 mg/dL — ABNORMAL HIGH (ref 70–99)
Potassium: 4.7 mmol/L (ref 3.5–5.1)
Sodium: 142 mmol/L (ref 135–145)
Total Bilirubin: 0.9 mg/dL (ref 0.3–1.2)
Total Protein: 6.8 g/dL (ref 6.5–8.1)

## 2018-12-30 LAB — CBC WITH DIFFERENTIAL/PLATELET
Abs Immature Granulocytes: 0.16 10*3/uL — ABNORMAL HIGH (ref 0.00–0.07)
Basophils Absolute: 0 10*3/uL (ref 0.0–0.1)
Basophils Relative: 0 %
Eosinophils Absolute: 0 10*3/uL (ref 0.0–0.5)
Eosinophils Relative: 0 %
HCT: 42.8 % (ref 39.0–52.0)
Hemoglobin: 13.9 g/dL (ref 13.0–17.0)
Immature Granulocytes: 1 %
Lymphocytes Relative: 7 %
Lymphs Abs: 1 10*3/uL (ref 0.7–4.0)
MCH: 26.7 pg (ref 26.0–34.0)
MCHC: 32.5 g/dL (ref 30.0–36.0)
MCV: 82.1 fL (ref 80.0–100.0)
Monocytes Absolute: 0.6 10*3/uL (ref 0.1–1.0)
Monocytes Relative: 4 %
Neutro Abs: 11.4 10*3/uL — ABNORMAL HIGH (ref 1.7–7.7)
Neutrophils Relative %: 88 %
Platelets: 460 10*3/uL — ABNORMAL HIGH (ref 150–400)
RBC: 5.21 MIL/uL (ref 4.22–5.81)
RDW: 17.8 % — ABNORMAL HIGH (ref 11.5–15.5)
WBC: 13.1 10*3/uL — ABNORMAL HIGH (ref 4.0–10.5)
nRBC: 0 % (ref 0.0–0.2)

## 2018-12-30 LAB — MAGNESIUM
Magnesium: 2.3 mg/dL (ref 1.7–2.4)
Magnesium: 2.5 mg/dL — ABNORMAL HIGH (ref 1.7–2.4)

## 2018-12-30 LAB — PHOSPHORUS
Phosphorus: 2.7 mg/dL (ref 2.5–4.6)
Phosphorus: 1.7 mg/dL — ABNORMAL LOW (ref 2.5–4.6)

## 2018-12-30 LAB — D-DIMER, QUANTITATIVE: D-Dimer, Quant: 2.88 ug/mL-FEU — ABNORMAL HIGH (ref 0.00–0.50)

## 2018-12-30 LAB — FERRITIN: Ferritin: 245 ng/mL (ref 24–336)

## 2018-12-30 LAB — C-REACTIVE PROTEIN: CRP: 2.4 mg/dL — ABNORMAL HIGH (ref <1.0)

## 2018-12-30 MED ORDER — METHYLPREDNISOLONE SODIUM SUCC 125 MG IJ SOLR
40.0000 mg | Freq: Three times a day (TID) | INTRAMUSCULAR | Status: AC
  Administered 2018-12-30: 40 mg via INTRAVENOUS
  Filled 2018-12-30: qty 2

## 2018-12-30 MED ORDER — VITAL HIGH PROTEIN PO LIQD: 1000.0000 mL | ORAL | Status: DC

## 2018-12-30 MED ORDER — ENSURE ENLIVE PO LIQD
237.0000 mL | Freq: Three times a day (TID) | ORAL | Status: DC
  Administered 2018-12-30 – 2019-01-04 (×14): 237 mL via ORAL

## 2018-12-30 MED ORDER — OSMOLITE 1.5 CAL PO LIQD
1000.0000 mL | ORAL | Status: DC
  Filled 2018-12-30 (×3): qty 1000

## 2018-12-30 MED ORDER — ZINC SULFATE 220 (50 ZN) MG PO CAPS
220.0000 mg | ORAL_CAPSULE | Freq: Every day | ORAL | Status: DC
  Administered 2018-12-30 – 2019-01-04 (×6): 220 mg via ORAL
  Filled 2018-12-30 (×6): qty 1

## 2018-12-30 MED ORDER — ACETAMINOPHEN 325 MG PO TABS
650.0000 mg | ORAL_TABLET | Freq: Four times a day (QID) | ORAL | Status: DC | PRN
  Administered 2019-01-03: 650 mg via ORAL
  Filled 2018-12-30: qty 2

## 2018-12-30 MED ORDER — PRO-STAT SUGAR FREE PO LIQD
30.0000 mL | Freq: Four times a day (QID) | ORAL | Status: DC
  Administered 2018-12-30: 30 mL
  Filled 2018-12-30: qty 30

## 2018-12-30 MED ORDER — VITAMIN C 500 MG PO TABS
500.0000 mg | ORAL_TABLET | Freq: Two times a day (BID) | ORAL | Status: DC
  Administered 2018-12-30 – 2019-01-04 (×11): 500 mg via ORAL
  Filled 2018-12-30 (×12): qty 1

## 2018-12-30 MED ORDER — ADULT MULTIVITAMIN W/MINERALS CH
1.0000 | ORAL_TABLET | Freq: Every day | ORAL | Status: DC
  Administered 2018-12-30 – 2019-01-04 (×6): 1 via ORAL
  Filled 2018-12-30 (×6): qty 1

## 2018-12-30 NOTE — Progress Notes (Signed)
[place] TEAM 1 - Stepdown/ICU TEAM  Timothy Juarez  ZES:[id number] DOB: September 16, 1950 DOA: 12/27/2018 PCP: Alvester Morin, MD    Brief Narrative:  26JF w/ a hx of CHF, COPD, recurrent DVTs on eliquis, PVD s/p left AKA, and anemia who presented from [facility] on 5/11 w/ shortness of breath. He tested positive for covid-19 a week prior and was sent to [facility] ED when he developed dyspnea. He was found to be hypoxic and encephalopathic. CXR showed bilateral infiltrates and WBC was elevated with left shift and elevation of inflammatory markers. He was admitted to [facility] where steroids and actemra were given.   Significant Events: 5/11 admit to [facility]  COVID-19 specific Treatment: Actemra #1 5/11 - #2 5/12 Steroids 5/12 >  Subjective: The patient is alert and conversant.  He tells me he feels better overall.  He denies chest pain nausea vomiting or abdominal pain.  He is oriented to person place and time.  Assessment & Plan:  Acute respiratory failure due to CAP and covid-19 infection Status post Actemra and ongoing steroids -clinically improving -no indication for other experimental treatments at this time   Acute toxic/metabolic encephalopathy Much improved at time of exam today  AKI Suspect hemodynamically mediated - improved with IV fluids and Foley catheter placement - questionable element of prostatic obstruction -voiding trial when able to ambulate  Hyperkalemia Due to acute kidney injury -resolved  Lactic acidosis Due to above -resolved  Mildly elevated LFTs Mild shock liver -resolved with volume expansion  Morbid obesity - Body mass index is 37.12 kg/m.  Stage III sacral pressure injury Care per WOC   PAD, hx left AKA  History of DVT Continue anticoagulation with eliquis  HTN Blood pressure trending up -adjust treatment and follow  Markedly prolonged QTC Appears to have corrected after discontinuation of azithromycin -continue to follow on  telemetry  DVT prophylaxis: Eliquis  Code Status: FULL CODE Family Communication:  Disposition Plan: Stepdown unit  Consultants:  none  Antimicrobials:  Azithromycin 5/11 > 5/12 Ceftriaxone 5/11 >  Objective: Blood pressure (!) 162/77, pulse 67, temperature 97.6 F (36.4 C), resp. rate (!) 24, height 5\' 6"  (1.676 m), weight 104.3 kg, SpO2 92 %.  Intake/Output Summary (Last 24 hours) at 12/30/2018 1050 Last data filed at 12/30/2018 0907 Gross per 24 hour  Intake 750 ml  Output 1500 ml  Net -750 ml   Filed Weights   12/27/18 1604  Weight: 104.3 kg    Examination: General: No acute respiratory distress at time of exam Lungs:  mild bibasilar crackles with no wheezing Cardiovascular: RRR without murmur or rub Abdomen: Nontender, nondistended, soft, bowel sounds positive, no rebound, no ascites, no appreciable mass Extremities: No significant cyanosis, clubbing, or edema right lower extremity  CBC: Recent Labs  Lab 12/27/18 0945  12/28/18 0427 12/29/18 0500 12/30/18 0753  WBC 15.0*  --  12.2* 16.2* 13.1*  NEUTROABS 12.0*  --   --  13.8* 11.4*  HGB 13.8   < > 12.4* 12.7* 13.9  HCT 42.0   < > 38.0* 39.6 42.8  MCV 81.4  --  85.4 84.1 82.1  PLT 314  --  342 409* 460*   < > = values in this interval not displayed.   Basic Metabolic Panel: Recent Labs  Lab 12/28/18 0427 12/29/18 0500 12/30/18 0500  NA 141 144 142  K 5.4* 4.6 4.7  CL 116* 115* 112*  CO2 18* 18* 19*  GLUCOSE 130* 121* 137*  BUN  43* 28* 21  CREATININE 1.45* 0.96 0.83  CALCIUM 7.6* 7.9* 7.9*   GFR: Estimated Creatinine Clearance: 96.4 mL/min (by C-G formula based on SCr of 0.83 mg/dL).  Liver Function Tests: Recent Labs  Lab 12/27/18 0945 12/28/18 0427 12/29/18 0500 12/30/18 0500  AST 51* 55* 32 26  ALT 24 22 20 19   ALKPHOS 75 62 66 69  BILITOT 1.9* 0.9 0.7 0.9  PROT 8.2* 6.6 6.7 6.8  ALBUMIN 2.8* 2.4* 2.3* 2.5*    Recent Results (from the past 240 hour(s))  Blood Culture (routine x  2)     Status: None (Preliminary result)   Collection Time: 12/27/18  9:43 AM  Result Value Ref Range Status   Specimen Description BLOOD LEFT HAND  Final   Special Requests   Final    BOTTLES DRAWN AEROBIC AND ANAEROBIC Blood Culture adequate volume   Culture   Final    NO GROWTH 3 DAYS Performed at [facility], [address], [place], [place] [address]    Report Status PENDING  Incomplete  Blood Culture (routine x 2)     Status: None (Preliminary result)   Collection Time: 12/27/18  9:44 AM  Result Value Ref Range Status   Specimen Description BLOOD RIGHT FA  Final   Special Requests   Final    BOTTLES DRAWN AEROBIC AND ANAEROBIC Blood Culture adequate volume   Culture   Final    NO GROWTH 3 DAYS Performed at [facility], [address], [place], [place] [address]    Report Status PENDING  Incomplete  SARS Coronavirus 2 [facility] order, Performed in [place] hospital lab)     Status: Abnormal   Collection Time: 12/27/18  9:44 AM  Result Value Ref Range Status   SARS Coronavirus 2 POSITIVE (A) NEGATIVE Final    Comment: RESULT CALLED TO, READ BACK BY AND VERIFIED WITH: TOM NAGY AT 8413 ON 12/27/2018 [place]. (NOTE) If result is NEGATIVE SARS-CoV-2 target nucleic acids are NOT DETECTED. The SARS-CoV-2 RNA is generally detectable in upper and lower  respiratory specimens during the acute phase of infection. The lowest  concentration of SARS-CoV-2 viral copies this assay can detect is 250  copies / mL. A negative result does not preclude SARS-CoV-2 infection  and should not be used as the sole basis for treatment or other  patient management decisions.  A negative result may occur with  improper specimen collection / handling, submission of specimen other  than nasopharyngeal swab, presence of viral mutation(s) within the  areas targeted by this assay, and inadequate number of viral copies  (<250 copies / mL). A negative result must be combined with clinical   observations, patient history, and epidemiological information. If result is POSITIVE SARS-CoV-2 target nucleic acids are DETECTED. Th e SARS-CoV-2 RNA is generally detectable in upper and lower  respiratory specimens during the acute phase of infection.  Positive  results are indicative of active infection with SARS-CoV-2.  Clinical  correlation with patient history and other diagnostic information is  necessary to determine patient infection status.  Positive results do  not rule out bacterial infection or co-infection with other viruses. If result is PRESUMPTIVE POSTIVE SARS-CoV-2 nucleic acids MAY BE PRESENT.   A presumptive positive result was obtained on the submitted specimen  and confirmed on repeat testing.  While 2019 novel coronavirus  (SARS-CoV-2) nucleic acids may be present in the submitted sample  additional confirmatory testing may be necessary for epidemiological  and / or  clinical management purposes  to differentiate between  SARS-CoV-2 and other Sarbecovirus currently known to infect humans.  If clinically indicated additional testing with an alternate test  methodology [phone]) is  advised. The SARS-CoV-2 RNA is generally  detectable in upper and lower respiratory specimens during the acute  phase of infection. The expected result is Negative. Fact Sheet for Patients:  StrictlyIdeas.no Fact Sheet for Healthcare Providers: BankingDealers.co.za This test is not yet approved or cleared by the [place] FDA and has been authorized for detection and/or diagnosis of SARS-CoV-2 by FDA under an Emergency Use Authorization (EUA).  This EUA will remain in effect (meaning this test can be used) for the duration of the COVID-19 declaration under Section 564(b)(1) of the Act, 21 U.S.C. section 360bbb-3(b)(1), unless the authorization is terminated or revoked sooner. Performed at [facility], [address], [place], [place] [address]   Urine culture     Status: Abnormal (Preliminary result)   Collection Time: 12/27/18 10:38 AM  Result Value Ref Range Status   Specimen Description   Final    URINE, RANDOM Performed at [facility], [address] [place], [place] [address]    Special Requests   Final    NONE Performed at [facility], [address] [place], [place] [address]    Culture (A)  Final    20,000 COLONIES/mL ESCHERICHIA COLI 20,000 COLONIES/mL PROTEUS MIRABILIS CULTURE REINCUBATED FOR BETTER GROWTH Performed at [facility], [place] [address] [place], [place] [address]    Report Status PENDING  Incomplete   Organism ID, Bacteria PROTEUS MIRABILIS (A)  Final      Susceptibility   Proteus mirabilis - MIC*    AMPICILLIN >=32 RESISTANT Resistant     CEFAZOLIN <=4 SENSITIVE Sensitive     CEFTRIAXONE <=1 SENSITIVE Sensitive     CIPROFLOXACIN >=4 RESISTANT Resistant     GENTAMICIN <=1 SENSITIVE Sensitive     IMIPENEM 2 SENSITIVE Sensitive     NITROFURANTOIN 128 RESISTANT Resistant     TRIMETH/SULFA >=320 RESISTANT Resistant     AMPICILLIN/SULBACTAM <=2 SENSITIVE Sensitive     PIP/TAZO <=4 SENSITIVE Sensitive     * 20,000 COLONIES/mL PROTEUS MIRABILIS  MRSA PCR Screening     Status: None   Collection Time: 12/28/18  5:03 AM  Result Value Ref Range Status   MRSA by PCR NEGATIVE NEGATIVE Final    Comment:        The GeneXpert MRSA Assay (FDA approved for NASAL specimens only), is one component of a comprehensive MRSA colonization surveillance program. It is not intended to diagnose MRSA infection nor to guide or monitor treatment for MRSA infections. Performed at [facility], [place] [address], [place], [place] [address]      Scheduled Meds: . apixaban  5 mg Oral BID  . aspirin EC  81 mg Oral Daily  . atorvastatin  10 mg Oral Daily  . chlorhexidine  15 mL Mouth Rinse BID  . feeding supplement (ENSURE ENLIVE)  237 mL Oral  TID BM  . fluticasone furoate-vilanterol  1 puff Inhalation Daily   And  . umeclidinium bromide  1 puff Inhalation Daily  . Gerhardt's butt cream   Topical BID  . mouth rinse  15 mL Mouth Rinse q12n4p  . methylPREDNISolone (SOLU-MEDROL) injection  40 mg Intravenous Q6H  . multivitamin with minerals  1 tablet Oral Daily  . sodium zirconium cyclosilicate  10 g Oral Daily  . tamsulosin  0.4  mg Oral QPC supper   Continuous Infusions: . sodium chloride Stopped (12/28/18 1825)  . cefTRIAXone (ROCEPHIN)  IV 1 g (12/30/18 0907)     LOS: 3 days   Cherene Altes, MD [facility] Office  [phone] Pager - Text Page per Amion  If 7PM-7AM, please contact night-coverage per Amion 12/30/2018, 10:50 AM

## 2018-12-30 NOTE — Progress Notes (Signed)
Initial Nutrition Assessment   RD working remotely.   DOCUMENTATION CODES:   Obesity unspecified  INTERVENTION:   Discussed recommendation of NG tube placement with addition of supplemental Enteral Nutrition with Attending MD. Received verbal order to begin TF  Tube Feeding:  Osmolite 1.5 at 40 ml/hr Pro-Stat 30 mL QID Provides 120 g of protein, 1840 kcals, 720 mL  Meets 100% protein needs, >90% calorie needs. Continue to assess po intake and adjust TF regimen as appropriate  Ensure Enlive po BID, each supplement provides 350 kcal and 20 grams of protein  MVI with Minerals  Recommend considering addition of Vitamin C 500 mg BID and Zinc 220 mg daily due to presumed deficiency given  full thickness wound that has been present since 2017  NUTRITION DIAGNOSIS:   Inadequate oral intake related to poor appetite, acute illness as evidenced by meal completion < 25%.  GOAL:   Patient will meet greater than or equal to 90% of their needs  MONITOR:   PO intake, Supplement acceptance, Labs, Weight trends, Skin  REASON FOR ASSESSMENT:   Consult Assessment of nutrition requirement/status  ASSESSMENT:   68 yo male admitted from [facility]  With acute on chronic respiratory failure with CAP and COVID-19 infection, severe sepsis with metabolic acidosis, toxic encephalopathy, AKI with hyperkalemia. PMH includes PVD with hx of left AKA, DVTs, CHF, COPD, HTN, pressure injury  Recorded po intake 0-25% of meals; <25% on average. PO intake inadequate at this time  No weight loss noted per weight encounters.   Pt with full thickness MASD wound to upper buttock near apex gluteal cleft, loss in both gluteal folds  Pt with Left AKA  Labs: Creatinine wdl, potassium wdl, ionized calcium wdl Meds: solumedrol, lokelma   NUTRITION - FOCUSED PHYSICAL EXAM:  Unable to assess  Diet Order:   Diet Order            DIET DYS 3 Room service appropriate? Yes; Fluid consistency: Thin   Diet effective now              EDUCATION NEEDS:   Not appropriate for education at this time  Skin:  Skin Assessment: Skin Integrity Issues: Skin Integrity Issues:: Other (Comment) Other: Full Thickness MASD to upper buttock near apex of gluteal gleft, loss on both gluteal folds  Last BM:  5/12  Height:   Ht Readings from Last 1 Encounters:  12/27/18 5\' 6"  (1.676 m)    Weight:   Wt Readings from Last 1 Encounters:  12/27/18 104.3 kg    Ideal Body Weight:  60.6 kg  BMI:  Body mass index is 37.12 kg/m.  Estimated Nutritional Needs:   Kcal:  5638-9373 kcals   Protein:  105-130 g   Fluid:  >/= 2 L   Kerman Passey MS, RD, LDN, CNSC [phone] Pager  [phone] Weekend/On-Call Pager

## 2018-12-30 NOTE — TOC Initial Note (Signed)
Transition of Care [facility]) - Initial/Assessment Note    Patient Details  Name: Timothy Juarez MRN: [id number] Date of Birth: 1951/07/15  Transition of Care [facility]) CM/SW Contact:    Alberteen Sam, LCSW Phone Number: 12/30/2018, 4:28 PM  Clinical Narrative:                  CSW consulted with Madupe patient's daughter in law who reports that Elmo Putt (patient's daughter) would also like to speak to CSW. Madupe reported that she is currently very pleased with [place] care given to patient at this time, however did report some communication discrepancies when patient was transported from SNF. CSW reached out to [place] who reports patient has been at [facility] for 6 years and they are happy with care given at [facility]. Elmo Putt reports preference for patient to go back to [facility] when medically stable. No further questions or concerns at this time, Elmo Putt given CSW number for future needs. CSW to continue to follow when discharge planning appropriate.   Expected Discharge Plan: [place] Barriers to Discharge: Continued Medical Work up   Patient Goals and CMS Choice Patient states their goals for this hospitalization and ongoing recovery are:: family would like patient to return to [email or web address] Compare Post Acute Care list provided to:: Patient Represenative (must comment)([place]) Choice offered to / list presented to : Adult Children  Expected Discharge Plan and Services Expected Discharge Plan: [place] In-house Referral: Clinical Social Work Discharge Planning Services: NA Post Acute Care Choice: [place] Living arrangements for the past 2 months: Skilled Nursing Facility(Patient has [facility] for 6 years) Expected Discharge Date: 12/30/18               DME Arranged: N/A DME Agency: NA       HH Arranged: NA [place] Agency: NA        Prior Living Arrangements/Services Living arrangements for the past 2  months: Skilled Nursing Facility(Patient has been living at [facility] for 6 years) Lives with:: Self Patient language and need for interpreter reviewed:: Yes Do you feel safe going back to the place where you live?: Yes      Need for Family Participation in Patient Care: Yes (Comment) Care giver support system in place?: Yes (comment)   Criminal Activity/Legal Involvement Pertinent to Current Situation/Hospitalization: No - Comment as needed  Activities of Daily Living Home Assistive Devices/Equipment: Wheelchair ADL Screening (condition at time of admission) Patient's cognitive ability adequate to safely complete daily activities?: Yes Is the patient deaf or have difficulty hearing?: No Does the patient have difficulty seeing, even when wearing glasses/contacts?: No Does the patient have difficulty concentrating, remembering, or making decisions?: No Patient able to express need for assistance with ADLs?: Yes Does the patient have difficulty dressing or bathing?: Yes Independently performs ADLs?: No Communication: Independent Dressing (OT): Dependent Is this a change from baseline?: Pre-admission baseline Grooming: Dependent Is this a change from baseline?: Pre-admission baseline Feeding: Independent Bathing: Dependent Is this a change from baseline?: Pre-admission baseline Toileting: Dependent Is this a change from baseline?: Pre-admission baseline In/Out Bed: Needs assistance Is this a change from baseline?: Change from baseline, expected to last <3 days Walks in Home: Needs assistance Is this a change from baseline?: Pre-admission baseline Does the patient have difficulty walking or climbing stairs?: Yes Weakness of Legs: Both Weakness of Arms/Hands: Both  Permission Sought/Granted Permission sought to share information with : Case  Freight forwarder, Customer service manager, Family Supports Permission granted to share information with : Yes, Verbal Permission Granted  Share  Information with NAME: Elmo Putt  Permission granted to share info w AGENCY: SNFs  Permission granted to share info w Relationship: daughter  Permission granted to share info w Contact Information: [phone]  Emotional Assessment Appearance:: Other (Comment Required(unable to assess, remote) Attitude/Demeanor/Rapport: Unable to Assess Affect (typically observed): Unable to Assess Orientation: : Oriented to Self, Oriented to  Time, Oriented to Place, Oriented to Situation Alcohol / Substance Use: Not Applicable Psych Involvement: No (comment)  Admission diagnosis:  covid 19 Patient Active Problem List   Diagnosis Date Noted  . Acute on chronic respiratory failure with hypoxia ([place]) 12/27/2018  . Community acquired bilateral lower lobe pneumonia ([place]) 12/27/2018  . COVID-19 virus infection 12/27/2018  . High anion gap metabolic acidosis 09/64/3838  . Severe sepsis ([place]) 12/27/2018  . AKI (acute kidney injury) ([place]) 12/27/2018  . Toxic encephalopathy 12/27/2018  . Acute respiratory failure with hypoxia ([place]) 12/27/2018  . Hx of AKA (above knee amputation), left ([place]) 11/02/2018  . Symptomatic anemia 10/10/2017  . Gastrointestinal hemorrhage   . Chronic deep vein thrombosis (DVT) of left lower extremity ([place])   . Iron deficiency anemia 04/21/2017  . Essential hypertension 03/13/2017  . Recurrent deep vein thrombosis (DVT) ([place]) 03/11/2017  . Hyperlipidemia 11/04/2016  . Pressure ulcer 04/11/2016  . Pseudoaneurysm of left femoral artery ([place]) 04/11/2016  . PAD (peripheral artery disease) ([place]) 04/10/2016  . Atherosclerosis of native arteries of extremities with gangrene, left leg ([place]) 11/01/2015  . Ischemia of lower extremity 09/14/2015  . Atherosclerotic peripheral vascular disease with ulceration ([place]) 01/18/2015   PCP:  Alvester Morin, MD Pharmacy:  No Pharmacies Listed    Social Determinants of Health (SDOH) Interventions    Readmission Risk Interventions No  flowsheet data found.

## 2018-12-30 NOTE — Progress Notes (Signed)
Patient's daughter in law Madupe updated over the phone.

## 2018-12-30 NOTE — Progress Notes (Signed)
After receiving order for NG tube feeding, patient was notified.  Patient not fond of the idea and expressed a strong desire to not pursue.  Discussed with Dietician who encouraged patient to drink Ensure and eat magic cups.  Patient acknowledged and since has done a good job with Ensure and magic cups.

## 2018-12-31 ENCOUNTER — Other Ambulatory Visit: Payer: Self-pay

## 2018-12-31 ENCOUNTER — Inpatient Hospital Stay (HOSPITAL_COMMUNITY): Payer: Medicare Other

## 2018-12-31 DIAGNOSIS — U071 COVID-19: Secondary | ICD-10-CM | POA: Diagnosis not present

## 2018-12-31 DIAGNOSIS — J440 Chronic obstructive pulmonary disease with acute lower respiratory infection: Secondary | ICD-10-CM | POA: Diagnosis not present

## 2018-12-31 DIAGNOSIS — E872 Acidosis: Secondary | ICD-10-CM | POA: Diagnosis not present

## 2018-12-31 DIAGNOSIS — J9621 Acute and chronic respiratory failure with hypoxia: Secondary | ICD-10-CM | POA: Diagnosis not present

## 2018-12-31 DIAGNOSIS — G92 Toxic encephalopathy: Secondary | ICD-10-CM | POA: Diagnosis not present

## 2018-12-31 DIAGNOSIS — I5032 Chronic diastolic (congestive) heart failure: Secondary | ICD-10-CM | POA: Diagnosis not present

## 2018-12-31 DIAGNOSIS — J069 Acute upper respiratory infection, unspecified: Secondary | ICD-10-CM | POA: Diagnosis not present

## 2018-12-31 DIAGNOSIS — L89153 Pressure ulcer of sacral region, stage 3: Secondary | ICD-10-CM | POA: Diagnosis not present

## 2018-12-31 DIAGNOSIS — I82512 Chronic embolism and thrombosis of left femoral vein: Secondary | ICD-10-CM | POA: Diagnosis not present

## 2018-12-31 DIAGNOSIS — J22 Unspecified acute lower respiratory infection: Secondary | ICD-10-CM | POA: Diagnosis not present

## 2018-12-31 DIAGNOSIS — N179 Acute kidney failure, unspecified: Secondary | ICD-10-CM | POA: Diagnosis not present

## 2018-12-31 DIAGNOSIS — J1289 Other viral pneumonia: Secondary | ICD-10-CM | POA: Diagnosis not present

## 2018-12-31 DIAGNOSIS — K7200 Acute and subacute hepatic failure without coma: Secondary | ICD-10-CM | POA: Diagnosis not present

## 2018-12-31 DIAGNOSIS — A4189 Other specified sepsis: Secondary | ICD-10-CM | POA: Diagnosis not present

## 2018-12-31 DIAGNOSIS — I82502 Chronic embolism and thrombosis of unspecified deep veins of left lower extremity: Secondary | ICD-10-CM | POA: Diagnosis not present

## 2018-12-31 LAB — COMPREHENSIVE METABOLIC PANEL
ALT: 18 U/L (ref 0–44)
AST: 15 U/L (ref 15–41)
Albumin: 2.6 g/dL — ABNORMAL LOW (ref 3.5–5.0)
Alkaline Phosphatase: 53 U/L (ref 38–126)
Anion gap: 5 (ref 5–15)
BUN: 28 mg/dL — ABNORMAL HIGH (ref 8–23)
CO2: 29 mmol/L (ref 22–32)
Calcium: 8.3 mg/dL — ABNORMAL LOW (ref 8.9–10.3)
Chloride: 110 mmol/L (ref 98–111)
Creatinine, Ser: 0.96 mg/dL (ref 0.61–1.24)
GFR calc Af Amer: >60 mL/min (ref >60)
GFR calc non Af Amer: >60 mL/min (ref >60)
Glucose, Bld: 160 mg/dL — ABNORMAL HIGH (ref 70–99)
Potassium: 4 mmol/L (ref 3.5–5.1)
Sodium: 144 mmol/L (ref 135–145)
Total Bilirubin: 0.8 mg/dL (ref 0.3–1.2)
Total Protein: 6.7 g/dL (ref 6.5–8.1)

## 2018-12-31 LAB — URINE CULTURE: Culture: 20000 — AB

## 2018-12-31 LAB — D-DIMER, QUANTITATIVE: D-Dimer, Quant: 2.63 ug/mL-FEU — ABNORMAL HIGH (ref 0.00–0.50)

## 2018-12-31 LAB — FERRITIN: Ferritin: 191 ng/mL (ref 24–336)

## 2018-12-31 MED ORDER — LOPERAMIDE HCL 2 MG PO CAPS
2.0000 mg | ORAL_CAPSULE | ORAL | Status: DC | PRN
  Administered 2018-12-31: 2 mg via ORAL
  Filled 2018-12-31 (×2): qty 1

## 2018-12-31 NOTE — Progress Notes (Addendum)
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 He tested positive for covid-19 a week prior and was sent to [facility] ED when he developed dyspnea. He was found to be hypoxic and encephalopathic. CXR showed bilateral infiltrates and WBC was elevated with left shift and elevation of inflammatory markers. He was admitted to [facility] where steroids and actemra were given.   Significant Events: 5/11 admit to [facility]  COVID-19 specific Treatment: Actemra #1 5/11 - #2 5/12 Steroids 5/12 >  Subjective: CXR slightly improved today.   Assessment & Plan:  Acute respiratory failure due to Covid-19 infection Status post Actemra and ongoing steroids -clinically improving -no indication for other experimental treatments at this time - wean O2 as able    Acute toxic/metabolic encephalopathy Much improved at time of exam today - appears to have returned to his baseline mental status   AKI Suspect hemodynamically mediated - resolved with IV fluids and catheter placement - questionable element of prostatic obstruction - voiding trial when able to ambulate  Hyperkalemia Due to acute kidney injury - resolved  Mildly elevated LFTs Mild shock liver - resolved with volume expansion  Morbid obesity - Body mass index is 37.12 kg/m.  Stage III sacral pressure injury Care per WOC   PAD, hx left AKA  History of DVT Continue anticoagulation with eliquis  HTN Follow w/o change in tx today   Markedly prolonged QTC Appears to have corrected after discontinuation of azithromycin - continue to follow on telemetry  DVT prophylaxis: Eliquis  Code Status: FULL CODE Family Communication:  Disposition Plan: downgrade to tele bed - potential d/c  back to SNF in 24-48hrs  Consultants:  none  Antimicrobials:  Azithromycin 5/11 > 5/12 Ceftriaxone 5/11 > 5/15  Objective: Blood pressure 140/70, pulse (!) 55, temperature 97.8 F (36.6 C), resp. rate 16, height 5\' 6"  (1.676 m), weight 104.3 kg, SpO2 95 %.  Intake/Output Summary (Last 24 hours) at 12/31/2018 0911 Last data filed at 12/31/2018 0500 Gross per 24 hour  Intake 340 ml  Output 1100 ml  Net -760 ml   Filed Weights   12/27/18 1604  Weight: 104.3 kg    Examination: General: No acute respiratory distress - alert and conversant  Lungs:  mild bibasilar crackles w/o change - no wheezing  Cardiovascular: RRR - no M or rub  Abdomen: NT/ND, soft, bowel sounds positive, no rebound, no ascites, no appreciable mass Extremities: No signif C/C/E right lower extremity  CBC: Recent Labs  Lab 12/27/18 0945  12/28/18 0427 12/29/18 0500 12/30/18 0753  WBC 15.0*  --  12.2* 16.2* 13.1*  NEUTROABS 12.0*  --   --  13.8* 11.4*  HGB 13.8   < > 12.4* 12.7* 13.9  HCT 42.0   < > 38.0* 39.6 42.8  MCV 81.4  --  85.4 84.1 82.1  PLT 314  --  342 409* 460*   < > = values in this interval not displayed.   Basic Metabolic Panel: Recent Labs  Lab 12/29/18 0500 12/30/18 0500 12/30/18 0753 12/30/18 1400 12/31/18 0635  NA 144 142  --   --  144  K 4.6 4.7  --   --  4.0  CL 115* 112*  --   --  110  CO2 18* 19*  --   --  29  GLUCOSE 121* 137*  --   --  160*  BUN 28* 21  --   --  28*  CREATININE 0.96 0.83  --   --  0.96  CALCIUM 7.9* 7.9*  --   --  8.3*  MG  --   --  2.3 2.5*  --   PHOS  --   --  2.7 1.7*  --    GFR: Estimated Creatinine Clearance: 83.3 mL/min (by C-G formula based on SCr of 0.96 mg/dL).  Liver Function Tests: Recent Labs  Lab 12/28/18 0427 12/29/18 0500 12/30/18 0500 12/31/18 0635  AST 55* 32 26 15  ALT 22 20 19 18   ALKPHOS 62 66 69 53  BILITOT 0.9 0.7 0.9 0.8  PROT 6.6 6.7 6.8 6.7  ALBUMIN 2.4* 2.3* 2.5* 2.6*    Recent Results (from the past 240  hour(s))  Blood Culture (routine x 2)     Status: None (Preliminary result)   Collection Time: 12/27/18  9:43 AM  Result Value Ref Range Status   Specimen Description BLOOD LEFT HAND  Final   Special Requests   Final    BOTTLES DRAWN AEROBIC AND ANAEROBIC Blood Culture adequate volume   Culture   Final    NO GROWTH 4 DAYS Performed at [facility] [address], [place], [place] [address]    Report Status PENDING  Incomplete  Blood Culture (routine x 2)     Status: None (Preliminary result)   Collection Time: 12/27/18  9:44 AM  Result Value Ref Range Status   Specimen Description BLOOD RIGHT FA  Final   Special Requests   Final    BOTTLES DRAWN AEROBIC AND ANAEROBIC Blood Culture adequate volume   Culture   Final    NO GROWTH 4 DAYS Performed at [facility], [address], [place], [place] [address]    Report Status PENDING  Incomplete  SARS Coronavirus 2 [facility] order, Performed in [place] hospital lab)     Status: Abnormal   Collection Time: 12/27/18  9:44 AM  Result Value Ref Range Status   SARS Coronavirus 2 POSITIVE (A) NEGATIVE Final    Comment: RESULT CALLED TO, READ BACK BY AND VERIFIED WITH: TOM NAGY AT 3846 ON 12/27/2018 [place]. (NOTE) If result is NEGATIVE SARS-CoV-2 target nucleic acids are NOT DETECTED. The SARS-CoV-2 RNA is generally detectable in upper and lower  respiratory specimens during the acute phase of infection. The lowest  concentration of SARS-CoV-2 viral copies this assay can detect is 250  copies / mL. A negative result does not preclude SARS-CoV-2 infection  and should not be used as the sole basis for treatment or other  patient management decisions.  A negative result may occur with  improper specimen collection / handling, submission of specimen other  than nasopharyngeal swab, presence of viral mutation(s) within the  areas targeted by this assay, and inadequate number of viral copies  (<250 copies / mL). A negative  result must be combined with clinical  observations, patient history, and epidemiological information. If result is POSITIVE SARS-CoV-2 target nucleic acids are DETECTED. Th e SARS-CoV-2 RNA is generally detectable in upper and lower  respiratory specimens during the acute phase of infection.  Positive  results are indicative of active infection with SARS-CoV-2.  Clinical  correlation with patient history and other diagnostic information is  necessary to determine patient infection status.  Positive results do  not rule out bacterial infection or co-infection with other viruses. If result is PRESUMPTIVE POSTIVE SARS-CoV-2 nucleic acids MAY BE PRESENT.   A presumptive positive result was obtained on the submitted specimen  and confirmed on repeat testing.  While 2019 novel coronavirus  (SARS-CoV-2) nucleic acids may be present in the submitted sample  additional confirmatory testing may be necessary for epidemiological  and / or clinical management purposes  to differentiate between  SARS-CoV-2 and other Sarbecovirus currently known to infect humans.  If clinically indicated additional testing with an alternate test  methodology [phone]) is  advised. The SARS-CoV-2 RNA is generally  detectable in upper and lower respiratory specimens during the acute  phase of infection. The expected result is Negative. Fact Sheet for Patients:  StrictlyIdeas.no Fact Sheet for Healthcare Providers: BankingDealers.co.za This test is not yet approved or cleared by the [place] FDA and has been authorized for detection and/or diagnosis of SARS-CoV-2 by FDA under an Emergency Use Authorization (EUA).  This EUA will remain in effect (meaning this test can be used) for the duration of the COVID-19 declaration under Section 564(b)(1) of the Act, 21 U.S.C. section 360bbb-3(b)(1), unless the authorization is terminated or revoked sooner. Performed at  [facility], [address], [place], [place] [address]   Urine culture     Status: Abnormal   Collection Time: 12/27/18 10:38 AM  Result Value Ref Range Status   Specimen Description   Final    URINE, RANDOM Performed at [facility], [address], [place], [place] [address]    Special Requests   Final    NONE Performed at [facility], [place], [place] [address]    Culture (A)  Final    20,000 COLONIES/mL ESCHERICHIA COLI 20,000 COLONIES/mL PROTEUS MIRABILIS Confirmed Extended Spectrum Beta-Lactamase Producer (ESBL).  In bloodstream infections from ESBL organisms, carbapenems are preferred over piperacillin/tazobactam. They are shown to have a lower risk of mortality. ESBL IS IN REFERENCE TO THE ECOLI Performed at [facility] [address], [place], [place] [address]    Report Status 12/31/2018 FINAL  Final   Organism ID, Bacteria PROTEUS MIRABILIS (A)  Final   Organism ID, Bacteria ESCHERICHIA COLI (A)  Final      Susceptibility   Escherichia coli - MIC*    AMPICILLIN >=32 RESISTANT Resistant     CEFAZOLIN >=64 RESISTANT Resistant     CEFTRIAXONE >=64 RESISTANT Resistant     CIPROFLOXACIN >=4 RESISTANT Resistant     GENTAMICIN <=1 SENSITIVE Sensitive     IMIPENEM <=0.25 SENSITIVE Sensitive     NITROFURANTOIN <=16 SENSITIVE Sensitive     TRIMETH/SULFA <=20 SENSITIVE Sensitive     AMPICILLIN/SULBACTAM 8 SENSITIVE Sensitive     PIP/TAZO <=4 SENSITIVE Sensitive     Extended ESBL POSITIVE Resistant     * 20,000 COLONIES/mL ESCHERICHIA COLI   Proteus mirabilis - MIC*    AMPICILLIN >=32 RESISTANT Resistant     CEFAZOLIN <=4 SENSITIVE Sensitive     CEFTRIAXONE <=1 SENSITIVE Sensitive     CIPROFLOXACIN >=4 RESISTANT Resistant     GENTAMICIN <=1 SENSITIVE Sensitive     IMIPENEM 2 SENSITIVE Sensitive     NITROFURANTOIN 128 RESISTANT Resistant     TRIMETH/SULFA >=320 RESISTANT Resistant     AMPICILLIN/SULBACTAM <=2  SENSITIVE Sensitive     PIP/TAZO <=4 SENSITIVE Sensitive     * 20,000 COLONIES/mL PROTEUS MIRABILIS  MRSA PCR Screening     Status: None  Collection Time: 12/28/18  5:03 AM  Result Value Ref Range Status   MRSA by PCR NEGATIVE NEGATIVE Final    Comment:        The GeneXpert MRSA Assay (FDA approved for NASAL specimens only), is one component of a comprehensive MRSA colonization surveillance program. It is not intended to diagnose MRSA infection nor to guide or monitor treatment for MRSA infections. Performed at [facility], [place] [address], [place], [place] [address]      Scheduled Meds: . apixaban  5 mg Oral BID  . aspirin EC  81 mg Oral Daily  . atorvastatin  10 mg Oral Daily  . chlorhexidine  15 mL Mouth Rinse BID  . feeding supplement (ENSURE ENLIVE)  237 mL Oral TID BM  . fluticasone furoate-vilanterol  1 puff Inhalation Daily   And  . umeclidinium bromide  1 puff Inhalation Daily  . Gerhardt's butt cream   Topical BID  . mouth rinse  15 mL Mouth Rinse q12n4p  . multivitamin with minerals  1 tablet Oral Daily  . sodium zirconium cyclosilicate  10 g Oral Daily  . tamsulosin  0.4 mg Oral QPC supper  . vitamin C  500 mg Oral BID  . zinc sulfate  220 mg Oral Daily     LOS: 4 days   Cherene Altes, MD [facility] Office  [phone] Pager - Text Page per Amion  If 7PM-7AM, please contact night-coverage per Amion 12/31/2018, 9:11 AM

## 2018-12-31 NOTE — Progress Notes (Signed)
  Speech Language Pathology Treatment: Dysphagia  Patient Details Name: Timothy Juarez MRN: [id number] DOB: 1951/07/27 Today's Date: 12/31/2018 Time: 0459-9774 SLP Time Calculation (min) (ACUTE ONLY): 12 min  Assessment / Plan / Recommendation Clinical Impression  Pt with much improved cognition/communication; he is oriented x3, initiating use of his incentive spirometer; able to attend and engage in conversation, answer biographical questions.  Pt consumed dysphagia 3 solids and three liquids without deficit, adequate attention to self-feeding, no s/s of aspiration. Dysphagia appears to have resolved and MS is likely at or near baseline.  No further SLP needs identified - our service will sign off.   HPI HPI: Pt is a 68 yo male presenting from [facility] due to worsening dyspnea and AMS after testing positive for COVID-19 a week prior. PMH includes: PNA, pressure ulcer, muscle contracture, insomnia, HTN, HLD, gout, GERD, COPD, CHF      SLP Plan  All goals met       Recommendations  Diet recommendations: Dysphagia 3 (mechanical soft);Thin liquid Liquids provided via: Cup;Straw Medication Administration: Whole meds with puree Supervision: Patient able to self feed;Intermittent supervision to cue for compensatory strategies Compensations: Minimize environmental distractions;Slow rate;Small sips/bites Postural Changes and/or Swallow Maneuvers: Seated upright 90 degrees;Upright 30-60 min after meal                Oral Care Recommendations: Oral care BID Follow up Recommendations: Skilled Nursing facility SLP Visit Diagnosis: Dysphagia, unspecified (R13.10) Plan: All goals met       GO              Raaga Maeder L. Tivis Ringer, [place] CCC/SLP Acute Rehabilitation Services Office number [phone]     Juan Quam Laurice 12/31/2018, 1:23 PM

## 2019-01-01 DIAGNOSIS — J069 Acute upper respiratory infection, unspecified: Secondary | ICD-10-CM | POA: Diagnosis not present

## 2019-01-01 DIAGNOSIS — I82512 Chronic embolism and thrombosis of left femoral vein: Secondary | ICD-10-CM | POA: Diagnosis not present

## 2019-01-01 DIAGNOSIS — J9621 Acute and chronic respiratory failure with hypoxia: Secondary | ICD-10-CM | POA: Diagnosis not present

## 2019-01-01 DIAGNOSIS — A4189 Other specified sepsis: Secondary | ICD-10-CM | POA: Diagnosis not present

## 2019-01-01 DIAGNOSIS — G92 Toxic encephalopathy: Secondary | ICD-10-CM | POA: Diagnosis not present

## 2019-01-01 DIAGNOSIS — J1289 Other viral pneumonia: Secondary | ICD-10-CM | POA: Diagnosis not present

## 2019-01-01 DIAGNOSIS — L89153 Pressure ulcer of sacral region, stage 3: Secondary | ICD-10-CM | POA: Diagnosis not present

## 2019-01-01 DIAGNOSIS — U071 COVID-19: Secondary | ICD-10-CM | POA: Diagnosis not present

## 2019-01-01 LAB — CULTURE, BLOOD (ROUTINE X 2)
Culture: NO GROWTH
Culture: NO GROWTH
Special Requests: ADEQUATE
Special Requests: ADEQUATE

## 2019-01-01 LAB — BASIC METABOLIC PANEL
Anion gap: 7 (ref 5–15)
BUN: 25 mg/dL — ABNORMAL HIGH (ref 8–23)
CO2: 28 mmol/L (ref 22–32)
Calcium: 7.8 mg/dL — ABNORMAL LOW (ref 8.9–10.3)
Chloride: 108 mmol/L (ref 98–111)
Creatinine, Ser: 0.88 mg/dL (ref 0.61–1.24)
GFR calc Af Amer: >60 mL/min (ref >60)
GFR calc non Af Amer: >60 mL/min (ref >60)
Glucose, Bld: 94 mg/dL (ref 70–99)
Potassium: 3.1 mmol/L — ABNORMAL LOW (ref 3.5–5.1)
Sodium: 143 mmol/L (ref 135–145)

## 2019-01-01 LAB — MAGNESIUM: Magnesium: 2.4 mg/dL (ref 1.7–2.4)

## 2019-01-01 MED ORDER — MELATONIN 3 MG PO TABS
6.0000 mg | ORAL_TABLET | Freq: Every evening | ORAL | Status: DC | PRN
  Filled 2019-01-01: qty 2

## 2019-01-01 MED ORDER — FUROSEMIDE 20 MG PO TABS
40.0000 mg | ORAL_TABLET | Freq: Every day | ORAL | Status: DC
  Administered 2019-01-02 – 2019-01-04 (×3): 40 mg via ORAL
  Filled 2019-01-01 (×3): qty 2

## 2019-01-01 MED ORDER — FUROSEMIDE 10 MG/ML IJ SOLN
40.0000 mg | Freq: Once | INTRAMUSCULAR | Status: AC
  Administered 2019-01-01: 40 mg via INTRAVENOUS
  Filled 2019-01-01: qty 4

## 2019-01-01 NOTE — Progress Notes (Signed)
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 He tested positive for covid-19 a week prior and was sent to [facility] ED when he developed dyspnea. He was found to be hypoxic and encephalopathic. CXR showed bilateral infiltrates and WBC was elevated with left shift and elevation of inflammatory markers. He was admitted to [facility], [facility], [facility] where steroids and actemra were given.   Significant Events: 5/11 admit to [facility]  COVID-19 specific Treatment: Actemra #1 5/11 - #2 5/12 Steroids 5/12 > 5/14  Subjective: Experienced desaturation w/ logroll for bed change this AM. Refused proning. Stabilized w/ increased HFNC support. Alert and pleasant. Denies cp, n/v, abdom pain.  Assessment & Plan:  Acute respiratory failure due to Covid-19 infection Status post Actemra and steroids - some recurrent worsening of hypoxia today - cont current support and follow - attempt gentle increase in diuresis    Acute toxic/metabolic encephalopathy Much improved - appears to have returned to his baseline mental status   AKI Suspect hemodynamically mediated - resolved with IV fluids and catheter placement - questionable element of prostatic obstruction - voiding trial when able to ambulate - monitor w/ increasing dose of diuretic   Hyperkalemia Due to acute kidney injury - resolved  Hypokalemia  Mg is normal - gently supplement   Mildly elevated LFTs Mild shock liver - resolved with volume expansion  Morbid obesity - Body mass index is 37.18 kg/m.  Stage III sacral pressure injury Care per WOC   PAD, hx left AKA  History of DVT Continue anticoagulation with eliquis  HTN Follow w/o change in tx today   Markedly prolonged QTC Appears to have  corrected after discontinuation of azithromycin - continue to follow on telemetry  DVT prophylaxis: Eliquis  Code Status: FULL CODE Family Communication:  Disposition Plan: downgrade to tele bed - potential d/c back to SNF in 24-48hrs if hypoxia stabilizes   Consultants:  none  Antimicrobials:  Azithromycin 5/11 > 5/12 Ceftriaxone 5/11 > 5/15  Objective: Blood pressure 138/68, pulse 69, temperature 97.6 F (36.4 C), temperature source Oral, resp. rate 16, height 5\' 6"  (1.676 m), weight 104.5 kg, SpO2 90 %.  Intake/Output Summary (Last 24 hours) at 01/01/2019 0852 Last data filed at 12/31/2018 2052 Gross per 24 hour  Intake 880 ml  Output 550 ml  Net 330 ml   Filed Weights   12/27/18 1604 01/01/19 0500  Weight: 104.3 kg 104.5 kg    Examination: General: No acute respiratory distress - alert  Lungs:  Mild B scattered crackles - no wheezing   Cardiovascular: RRR w/o M or rub  Abdomen: NT/ND, soft, BS+, no rebound, no ascites, no appreciable mass Extremities: No signif C/C/E R LE   CBC: Recent Labs  Lab 12/27/18 0945  12/28/18 0427 12/29/18 0500 12/30/18 0753  WBC 15.0*  --  12.2* 16.2* 13.1*  NEUTROABS 12.0*  --   --  13.8* 11.4*  HGB 13.8   < > 12.4* 12.7* 13.9  HCT 42.0   < > 38.0* 39.6 42.8  MCV 81.4  --  85.4 84.1 82.1  PLT 314  --  342 409* 460*   < > = values in this interval not displayed.   Basic Metabolic Panel: Recent Labs  Lab 12/30/18 0500 12/30/18 0753 12/30/18 1400 12/31/18 0635 01/01/19 0320  NA 142  --   --  144 143  K 4.7  --   --  4.0 3.1*  CL 112*  --   --  110 108  CO2 19*  --   --  29 28  GLUCOSE 137*  --   --  160* 94  BUN 21  --   --  28* 25*  CREATININE 0.83  --   --  0.96 0.88  CALCIUM 7.9*  --   --  8.3* 7.8*  MG  --  2.3 2.5*  --  2.4  PHOS  --  2.7 1.7*  --   --    GFR: Estimated Creatinine Clearance: 91 mL/min (by C-G formula based on SCr of 0.88 mg/dL).  Liver Function Tests: Recent Labs  Lab 12/28/18 0427 12/29/18  0500 12/30/18 0500 12/31/18 0635  AST 55* 32 26 15  ALT 22 20 19 18   ALKPHOS 62 66 69 53  BILITOT 0.9 0.7 0.9 0.8  PROT 6.6 6.7 6.8 6.7  ALBUMIN 2.4* 2.3* 2.5* 2.6*    Recent Results (from the past 240 hour(s))  Blood Culture (routine x 2)     Status: None   Collection Time: 12/27/18  9:43 AM  Result Value Ref Range Status   Specimen Description BLOOD LEFT HAND  Final   Special Requests   Final    BOTTLES DRAWN AEROBIC AND ANAEROBIC Blood Culture adequate volume   Culture   Final    NO GROWTH 5 DAYS Performed at [facility], [address] [place], [place] [address]    Report Status 01/01/2019 FINAL  Final  Blood Culture (routine x 2)     Status: None   Collection Time: 12/27/18  9:44 AM  Result Value Ref Range Status   Specimen Description BLOOD RIGHT FA  Final   Special Requests   Final    BOTTLES DRAWN AEROBIC AND ANAEROBIC Blood Culture adequate volume   Culture   Final    NO GROWTH 5 DAYS Performed at [facility], [address] [place],  [address]    Report Status 01/01/2019 FINAL  Final  SARS Coronavirus 2 [facility] order, Performed in [place] hospital lab)     Status: Abnormal   Collection Time: 12/27/18  9:44 AM  Result Value Ref Range Status   SARS Coronavirus 2 POSITIVE (A) NEGATIVE Final    Comment: RESULT CALLED TO, READ BACK BY AND VERIFIED WITH: TOM NAGY AT 2094 ON 12/27/2018 [place]. (NOTE) If result is NEGATIVE SARS-CoV-2 target nucleic acids are NOT DETECTED. The SARS-CoV-2 RNA is generally detectable in upper and lower  respiratory specimens during the acute phase of infection. The lowest  concentration of SARS-CoV-2 viral copies this assay can detect is 250  copies / mL. A negative result does not preclude SARS-CoV-2 infection  and should not be used as the sole basis for treatment or other  patient management decisions.  A negative result may occur with  improper specimen collection / handling, submission of specimen  other  than nasopharyngeal swab, presence of viral mutation(s) within the  areas targeted by this assay, and inadequate number of viral copies  (<250 copies / mL). A negative result must be combined with clinical  observations, patient history, and epidemiological information. If result is POSITIVE SARS-CoV-2 target nucleic acids are DETECTED. Th e SARS-CoV-2 RNA is generally detectable in upper and lower  respiratory specimens during  the acute phase of infection.  Positive  results are indicative of active infection with SARS-CoV-2.  Clinical  correlation with patient history and other diagnostic information is  necessary to determine patient infection status.  Positive results do  not rule out bacterial infection or co-infection with other viruses. If result is PRESUMPTIVE POSTIVE SARS-CoV-2 nucleic acids MAY BE PRESENT.   A presumptive positive result was obtained on the submitted specimen  and confirmed on repeat testing.  While 2019 novel coronavirus  (SARS-CoV-2) nucleic acids may be present in the submitted sample  additional confirmatory testing may be necessary for epidemiological  and / or clinical management purposes  to differentiate between  SARS-CoV-2 and other Sarbecovirus currently known to infect humans.  If clinically indicated additional testing with an alternate test  methodology [id number]) is  advised. The SARS-CoV-2 RNA is generally  detectable in upper and lower respiratory specimens during the acute  phase of infection. The expected result is Negative. Fact Sheet for Patients:  StrictlyIdeas.no Fact Sheet for Healthcare Providers: BankingDealers.co.za This test is not yet approved or cleared by the [place] FDA and has been authorized for detection and/or diagnosis of SARS-CoV-2 by FDA under an Emergency Use Authorization (EUA).  This EUA will remain in effect (meaning this test can be used) for the duration  of the COVID-19 declaration under Section 564(b)(1) of the Act, 21 U.S.C. section 360bbb-3(b)(1), unless the authorization is terminated or revoked sooner. Performed at [facility], [address], [place], [place] [address]   Urine culture     Status: Abnormal   Collection Time: 12/27/18 10:38 AM  Result Value Ref Range Status   Specimen Description   Final    URINE, RANDOM Performed at [facility], [address] [place], [place] [address]    Special Requests   Final    NONE Performed at [facility], [place], [place] [address]    Culture (A)  Final    20,000 COLONIES/mL ESCHERICHIA COLI 20,000 COLONIES/mL PROTEUS MIRABILIS Confirmed Extended Spectrum Beta-Lactamase Producer (ESBL).  In bloodstream infections from ESBL organisms, carbapenems are preferred over piperacillin/tazobactam. They are shown to have a lower risk of mortality. ESBL IS IN REFERENCE TO THE ECOLI Performed at [facility] [address] [place],  [address]    Report Status 12/31/2018 FINAL  Final   Organism ID, Bacteria PROTEUS MIRABILIS (A)  Final   Organism ID, Bacteria ESCHERICHIA COLI (A)  Final      Susceptibility   Escherichia coli - MIC*    AMPICILLIN >=32 RESISTANT Resistant     CEFAZOLIN >=64 RESISTANT Resistant     CEFTRIAXONE >=64 RESISTANT Resistant     CIPROFLOXACIN >=4 RESISTANT Resistant     GENTAMICIN <=1 SENSITIVE Sensitive     IMIPENEM <=0.25 SENSITIVE Sensitive     NITROFURANTOIN <=16 SENSITIVE Sensitive     TRIMETH/SULFA <=20 SENSITIVE Sensitive     AMPICILLIN/SULBACTAM 8 SENSITIVE Sensitive     PIP/TAZO <=4 SENSITIVE Sensitive     Extended ESBL POSITIVE Resistant     * 20,000 COLONIES/mL ESCHERICHIA COLI   Proteus mirabilis - MIC*    AMPICILLIN >=32 RESISTANT Resistant     CEFAZOLIN <=4 SENSITIVE Sensitive     CEFTRIAXONE <=1 SENSITIVE Sensitive     CIPROFLOXACIN >=4 RESISTANT Resistant     GENTAMICIN <=1  SENSITIVE Sensitive     IMIPENEM 2 SENSITIVE Sensitive     NITROFURANTOIN 128 RESISTANT Resistant  TRIMETH/SULFA >=320 RESISTANT Resistant     AMPICILLIN/SULBACTAM <=2 SENSITIVE Sensitive     PIP/TAZO <=4 SENSITIVE Sensitive     * 20,000 COLONIES/mL PROTEUS MIRABILIS  MRSA PCR Screening     Status: None   Collection Time: 12/28/18  5:03 AM  Result Value Ref Range Status   MRSA by PCR NEGATIVE NEGATIVE Final    Comment:        The GeneXpert MRSA Assay (FDA approved for NASAL specimens only), is one component of a comprehensive MRSA colonization surveillance program. It is not intended to diagnose MRSA infection nor to guide or monitor treatment for MRSA infections. Performed at [facility], [place] [address] [place], [place] [address]      Scheduled Meds: . apixaban  5 mg Oral BID  . aspirin EC  81 mg Oral Daily  . atorvastatin  10 mg Oral Daily  . chlorhexidine  15 mL Mouth Rinse BID  . feeding supplement (ENSURE ENLIVE)  237 mL Oral TID BM  . fluticasone furoate-vilanterol  1 puff Inhalation Daily   And  . umeclidinium bromide  1 puff Inhalation Daily  . Gerhardt's butt cream   Topical BID  . mouth rinse  15 mL Mouth Rinse q12n4p  . multivitamin with minerals  1 tablet Oral Daily  . sodium zirconium cyclosilicate  10 g Oral Daily  . tamsulosin  0.4 mg Oral QPC supper  . vitamin C  500 mg Oral BID  . zinc sulfate  220 mg Oral Daily     LOS: 5 days   Cherene Altes, MD [facility] Office  [phone] Pager - Text Page per Amion  If 7PM-7AM, please contact night-coverage per Amion 01/01/2019, 8:52 AM

## 2019-01-01 NOTE — Progress Notes (Signed)
Pt desatted down to 71% briefly after he had to logroll for a linen change. Pt at the time was on 5L HFNC. After a few minutes sats remained in the mid 80s so o2 was increased to 8L HFNC. Pt satting right at 92% when this RN left room and the pt in the care of another RN. That RN to page MD on call to notify of event.

## 2019-01-01 NOTE — Progress Notes (Signed)
Covering for primary nurse, called to the room, informed patient sats @79 -80's. Patient encouraged to be proned, refusing position  hange. HF increased to 7. sats now hovering bet 90-93%. Dr Sherral Hammers called and informed of finding. Encouraged to reinforce  proning and to continue to monitor patient

## 2019-01-01 NOTE — Evaluation (Addendum)
Physical Therapy Evaluation Patient Details Name: Timothy Juarez MRN: [id number] DOB: Dec 18, 1950 Today's Date: 01/01/2019   History of Present Illness  Pt is a 68 yo male presenting 12/27/18 from [facility] SNF due to worsening dyspnea and AMS after testing positive for COVID-19 a week prior. Since adm +pna, toxic encephalopathy, AKI, PMH-diastolic HF, COPD (not O2 dependent), Lt AKA, HTN, recurrent DVTs on Eliquis, PVD,     Clinical Impression  Pt admitted with above diagnosis. Patient reports able to mobilize in SNF with use of his wheelchair. Pt tolerated bed mobility and bed exercises fairly poor due to decr SpO2 to 82% with 6L O2 via HFNC with incr time to recover. Pt currently with functional limitations due to the deficits listed below (see PT Problem List).  Pt will benefit from skilled PT to increase their independence and safety with mobility to allow discharge to the venue listed below.       Follow Up Recommendations SNF    Equipment Recommendations  None recommended by PT    Recommendations for Other Services OT consult     Precautions / Restrictions Precautions Precautions: Fall Restrictions Other Position/Activity Restrictions: Lt AKA; no prosthesis (no longer fits)      Mobility  Bed Mobility Overal bed mobility: Needs Assistance Bed Mobility: Rolling Rolling: Min assist         General bed mobility comments: pt attempting to come nearly to long-sitting became fatigued, SpO2 dropped to 82% and SLOWLY returned to 85% as he returned to supine (pt on 6L HFNC throughout session); assisted to roll rt without raising up (using bed pad)  Transfers                 General transfer comment: pt refused to attempt EOB and then SpO2 dropped significantly with propping up on 1 elbow (near long-sitting)  Ambulation/Gait             General Gait Details: NA; used w/c PTA  Stairs            Wheelchair Mobility    Modified Rankin (Stroke Patients  Only)       Balance                                             Pertinent Vitals/Pain Pain Assessment: No/denies pain    Home Living Family/patient expects to be discharged to:: Skilled nursing facility     Type of Home: [place]                Prior Function Level of Independence: Independent with assistive device(s)         Comments: per pt (?accuracy, however alert), independent with transfer to w/c and with w/c mobility     Hand Dominance        Extremity/Trunk Assessment   Upper Extremity Assessment Upper Extremity Assessment: Generalized weakness(nearly full shoulder flexion bil AROM)    Lower Extremity Assessment Lower Extremity Assessment: RLE deficits/detail;[name] deficits/detail RLE Deficits / Details: ankle DF to neutral; leg rests in external rotation and can only rotate to neutral; hip flexion 3/5, knee extension 3+;  [name] Deficits / Details: moves residual limb to 90 deg flexion, unable to tolerate positioning flat or sidelying to assess hip extension (due to respiratory status)       Communication   Communication: No difficulties  Cognition Arousal/Alertness: Awake/alert Behavior  During Therapy: WFL for tasks assessed/performed Overall Cognitive Status: No family/caregiver present to determine baseline cognitive functioning                                        General Comments      Exercises General Exercises - Lower Extremity Ankle Circles/Pumps: AROM;Right;10 reps Heel Slides: AAROM;Right;10 reps;Supine Hip ABduction/ADduction: AROM;Right;5 reps;Supine Straight Leg Raises: AROM;Right;5 reps;Supine Hip Flexion/Marching: AROM;Left;10 reps;Supine   Assessment/Plan    PT Assessment Patient needs continued PT services  PT Problem List Decreased strength;Decreased range of motion;Decreased activity tolerance;Decreased mobility;Decreased cognition;Decreased knowledge of use of  DME;Decreased safety awareness;Decreased knowledge of precautions;Cardiopulmonary status limiting activity       PT Treatment Interventions DME instruction;Functional mobility training;Therapeutic activities;Therapeutic exercise;Cognitive remediation;Patient/family education;Wheelchair mobility training    PT Goals (Current goals can be found in the Care Plan section)  Acute Rehab PT Goals Patient Stated Goal: be able to get into his w/c and get around like he did before PT Goal Formulation: With patient Time For Goal Achievement: 01/15/19 Potential to Achieve Goals: Good    Frequency Min 2X/week   Barriers to discharge        Co-evaluation               AM-PAC PT "6 Clicks" Mobility  Outcome Measure Help needed turning from your back to your side while in a flat bed without using bedrails?: A Lot Help needed moving from lying on your back to sitting on the side of a flat bed without using bedrails?: Total Help needed moving to and from a bed to a chair (including a wheelchair)?: Total Help needed standing up from a chair using your arms (e.g., wheelchair or bedside chair)?: Total Help needed to walk in hospital room?: Total Help needed climbing 3-5 steps with a railing? : Total 6 Click Score: 7    End of Session   Activity Tolerance: Treatment limited secondary to medical complications (Comment)(decreasing SpO2; incr SOB) Patient left: in bed;with call bell/phone within reach;with bed alarm set   PT Visit Diagnosis: Muscle weakness (generalized) (M62.81);Other abnormalities of gait and mobility (R26.89)    Time: 6659-9357 PT Time Calculation (min) (ACUTE ONLY): 24 min   Charges:   PT Evaluation $PT Eval Moderate Complexity: 1 Mod            [employer], PT 01/01/2019, 1:14 PM

## 2019-01-02 DIAGNOSIS — J1289 Other viral pneumonia: Secondary | ICD-10-CM | POA: Diagnosis not present

## 2019-01-02 DIAGNOSIS — L89153 Pressure ulcer of sacral region, stage 3: Secondary | ICD-10-CM | POA: Diagnosis not present

## 2019-01-02 DIAGNOSIS — U071 COVID-19: Secondary | ICD-10-CM | POA: Diagnosis not present

## 2019-01-02 DIAGNOSIS — J9621 Acute and chronic respiratory failure with hypoxia: Secondary | ICD-10-CM | POA: Diagnosis not present

## 2019-01-02 DIAGNOSIS — J069 Acute upper respiratory infection, unspecified: Secondary | ICD-10-CM | POA: Diagnosis not present

## 2019-01-02 DIAGNOSIS — G92 Toxic encephalopathy: Secondary | ICD-10-CM | POA: Diagnosis not present

## 2019-01-02 DIAGNOSIS — N179 Acute kidney failure, unspecified: Secondary | ICD-10-CM | POA: Diagnosis not present

## 2019-01-02 DIAGNOSIS — I82512 Chronic embolism and thrombosis of left femoral vein: Secondary | ICD-10-CM | POA: Diagnosis not present

## 2019-01-02 DIAGNOSIS — A4189 Other specified sepsis: Secondary | ICD-10-CM | POA: Diagnosis not present

## 2019-01-02 LAB — CBC WITH DIFFERENTIAL/PLATELET
Abs Immature Granulocytes: 0.19 10*3/uL — ABNORMAL HIGH (ref 0.00–0.07)
Basophils Absolute: 0 10*3/uL (ref 0.0–0.1)
Basophils Relative: 0 %
Eosinophils Absolute: 0.4 10*3/uL (ref 0.0–0.5)
Eosinophils Relative: 3 %
HCT: 47.2 % (ref 39.0–52.0)
Hemoglobin: 14.5 g/dL (ref 13.0–17.0)
Immature Granulocytes: 2 %
Lymphocytes Relative: 17 %
Lymphs Abs: 2 10*3/uL (ref 0.7–4.0)
MCH: 25.8 pg — ABNORMAL LOW (ref 26.0–34.0)
MCHC: 30.7 g/dL (ref 30.0–36.0)
MCV: 83.8 fL (ref 80.0–100.0)
Monocytes Absolute: 0.7 10*3/uL (ref 0.1–1.0)
Monocytes Relative: 6 %
Neutro Abs: 8.3 10*3/uL — ABNORMAL HIGH (ref 1.7–7.7)
Neutrophils Relative %: 72 %
Platelets: 446 10*3/uL — ABNORMAL HIGH (ref 150–400)
RBC: 5.63 MIL/uL (ref 4.22–5.81)
RDW: 18.3 % — ABNORMAL HIGH (ref 11.5–15.5)
WBC: 11.6 10*3/uL — ABNORMAL HIGH (ref 4.0–10.5)
nRBC: 0 % (ref 0.0–0.2)

## 2019-01-02 LAB — COMPREHENSIVE METABOLIC PANEL
ALT: 28 U/L (ref 0–44)
AST: 25 U/L (ref 15–41)
Albumin: 2.5 g/dL — ABNORMAL LOW (ref 3.5–5.0)
Alkaline Phosphatase: 54 U/L (ref 38–126)
Anion gap: 12 (ref 5–15)
BUN: 25 mg/dL — ABNORMAL HIGH (ref 8–23)
CO2: 31 mmol/L (ref 22–32)
Calcium: 8.1 mg/dL — ABNORMAL LOW (ref 8.9–10.3)
Chloride: 102 mmol/L (ref 98–111)
Creatinine, Ser: 1.08 mg/dL (ref 0.61–1.24)
GFR calc Af Amer: >60 mL/min (ref >60)
GFR calc non Af Amer: >60 mL/min (ref >60)
Glucose, Bld: 102 mg/dL — ABNORMAL HIGH (ref 70–99)
Potassium: 3.5 mmol/L (ref 3.5–5.1)
Sodium: 145 mmol/L (ref 135–145)
Total Bilirubin: 0.7 mg/dL (ref 0.3–1.2)
Total Protein: 5.8 g/dL — ABNORMAL LOW (ref 6.5–8.1)

## 2019-01-02 LAB — C-REACTIVE PROTEIN: CRP: <0.8 mg/dL (ref <1.0)

## 2019-01-02 LAB — FERRITIN: Ferritin: 184 ng/mL (ref 24–336)

## 2019-01-02 MED ORDER — BETHANECHOL CHLORIDE 10 MG PO TABS
10.0000 mg | ORAL_TABLET | Freq: Three times a day (TID) | ORAL | Status: DC
  Administered 2019-01-02 – 2019-01-04 (×6): 10 mg via ORAL
  Filled 2019-01-02 (×9): qty 1

## 2019-01-02 NOTE — Progress Notes (Signed)
OT Treatment Note  Returned to assist with mobilizing to chair. Pt able to complete lateral scoot/modified squat pivot transfer to chair toward R stronger side with Min A. Once in chair, pt requesting to return to bed. Educated pt on benefits of being OOB and sitting upright in chair. Educated nsg staff on safe transfer technique. Will continue to follow acutely to facilitate return to PLOF.  SpO2 91 on 5L during activity; quick rebound to 96; other VSS   01/02/19 1400  OT Visit Information  Last OT Received On 01/02/19  Assistance Needed +1  History of Present Illness Pt is a 68 yo male presenting 12/27/18 from [facility] SNF due to worsening dyspnea and AMS after testing positive for COVID-19 a week prior. Since adm +pna, toxic encephalopathy, AKI, PMH-diastolic HF, COPD (not O2 dependent), Lt AKA, HTN, recurrent DVTs on Eliquis, PVD,   Precautions  Precautions Fall  Precaution Comments monitor sats; L AKA  Pain Assessment  Pain Assessment Faces  Faces Pain Scale 4  Pain Location bottom; sore area  Pain Descriptors / Indicators Grimacing;Discomfort  Pain Intervention(s) Limited activity within patient's tolerance  Cognition  Arousal/Alertness Awake/alert  Behavior During Therapy WFL for tasks assessed/performed  Overall Cognitive Status No family/caregiver present to determine baseline cognitive functioning  General Comments most likely basleine  Upper Extremity Assessment  Upper Extremity Assessment Generalized weakness  Lower Extremity Assessment  Lower Extremity Assessment Defer to PT evaluation  ADL  Overall ADL's  Needs assistance/impaired  Functional mobility during ADLs Minimal assistance;Cueing for safety  General ADL Comments squat pivot into drop arm recliner.   Bed Mobility  Overal bed mobility Needs Assistance  Bed Mobility Supine to Sit  Supine to sit Mod assist  General bed mobility comments Difficulty with transition to EOB, requiring use of bed pad to shift weight  through hips  Balance  Overall balance assessment Needs assistance  Sitting balance-Leahy Scale Fair  Transfers  Overall transfer level Needs assistance  Transfers Squat Pivot Transfers  Squat pivot transfers Min assist  General transfer comment toward R side  Exercises  Exercises Other exercises  Other Exercises  Other Exercises encouraged BUE AROM as tolerated  OT - End of Session  Equipment Utilized During Treatment Oxygen (5L)  Activity Tolerance Patient tolerated treatment well  Patient left in chair;with call bell/phone within reach  Nurse Communication Mobility status  OT Assessment/Plan  OT Plan Discharge plan remains appropriate  OT Visit Diagnosis Other abnormalities of gait and mobility (R26.89);Muscle weakness (generalized) (M62.81);Pain  Pain - part of body  (bottom)  OT Frequency (ACUTE ONLY) Min 2X/week  Follow Up Recommendations SNF;Supervision/Assistance - 24 hour  OT Equipment None recommended by OT  AM-PAC OT "6 Clicks" Daily Activity Outcome Measure (Version 2)  Help from another person eating meals? 4  Help from another person taking care of personal grooming? 3  Help from another person toileting, which includes using toliet, bedpan, or urinal? 2  Help from another person bathing (including washing, rinsing, drying)? 2  Help from another person to put on and taking off regular upper body clothing? 3  Help from another person to put on and taking off regular lower body clothing? 2  6 Click Score 16  OT Goal Progression  Progress towards OT goals Progressing toward goals  Acute Rehab OT Goals  Patient Stated Goal be able to get into his w/c and get around like he did before  OT Goal Formulation With patient  Time For Goal Achievement 01/16/19  Potential to Achieve Goals Good  OT Time Calculation  OT Start Time (ACUTE ONLY) 1158  OT Stop Time (ACUTE ONLY) 1217  OT Time Calculation (min) 19 min  OT General Charges  $OT Visit 1 Visit  OT Treatments   $Self Care/Home Management  8-22 mins  Maurie Boettcher, OT/L   Acute OT Clinical Specialist [place] Pager [phone] Office [phone]

## 2019-01-02 NOTE — Progress Notes (Signed)
[name] - Stepdown/ICU TEAM  Timothy Juarez  CVE:[id number] DOB: 11/15/1950 DOA: 12/27/2018 PCP: Alvester Morin, MD    Brief Narrative:  2HE w/ a hx of CHF, COPD, recurrent DVTs on eliquis, PVD s/p left AKA, and anemia who presented from [facility] on 5/11 w/ shortness of breath. He tested positive for covid-19 a week prior and was sent to [facility] ED when he developed dyspnea. He was found to be hypoxic and encephalopathic. CXR showed bilateral infiltrates and WBC was elevated with left shift and elevation of inflammatory markers. He was admitted to [facility] where steroids and actemra were given.   Significant Events: 5/11 admit to [facility]  COVID-19 specific Treatment: Actemra #1 5/11 - #2 5/12 Steroids 5/12 > 5/14  Subjective: Resting comfortably in bed. No complaints. Denies cp, sob, n/v, or abdom pain. Requiring 5L [place] to keep sats in goal range.   Assessment & Plan:  Acute respiratory failure due to Covid-19 infection Status post Actemra and steroids - appears to be stabilizing, though will likely need O2 post d/c - cont current support and follow - cont home diuretic dose     Acute toxic/metabolic encephalopathy appears to have returned to his baseline mental status   AKI Suspect hemodynamically mediated - resolved with IV fluids and catheter placement - questionable element of prostatic obstruction - voiding trial when able to ambulate better - monitor w/ increasing dose of diuretic - begin urecholine w/ attempt to remove cath in AM  BOO ?prostatic hypertrophy v/s bladder dysfxn - flomax newly started this admit - adding empiric 3 days of urecholine - plan to d/c foley in AM and assure no recurrent BOO  Hyperkalemia Due to acute kidney injury - resolved  Hypokalemia  Mg is normal - gently supplement as needed   Mildly elevated LFTs Mild shock liver - resolved with volume expansion  Morbid obesity - Body mass index is 37.18 kg/m.  Stage III  sacral pressure injury Care per WOC   PAD, hx left AKA  History of DVT Continue anticoagulation with eliquis  HTN Follow w/o change in tx today   Markedly prolonged QTC Appears to have corrected after discontinuation of azithromycin - continue to follow on telemetry  DVT prophylaxis: Eliquis  Code Status: FULL CODE Family Communication:  Disposition Plan: downgraded to tele bed - potential d/c back to SNF in 24-48hrs if hypoxia stabilizes  Consultants:  none  Antimicrobials:  Azithromycin 5/11 > 5/12 Ceftriaxone 5/11 > 5/15  Objective: Blood pressure 98/72, pulse 92, temperature 98.4 F (36.9 C), temperature source Oral, resp. rate (!) 21, height 5\' 6"  (1.676 m), weight 104.5 kg, SpO2 92 %.  Intake/Output Summary (Last 24 hours) at 01/02/2019 1547 Last data filed at 01/02/2019 1216 Gross per 24 hour  Intake 120 ml  Output 1900 ml  Net -1780 ml   Filed Weights   12/27/18 1604 01/01/19 0500  Weight: 104.3 kg 104.5 kg    Examination: General: No acute respiratory distress - alert and conversant  Lungs:  Mild B scattered crackles w/o change  Cardiovascular: RRR Abdomen: NT/ND, soft, BS+, no rebound Extremities: trace R LE edema   CBC: Recent Labs  Lab 12/29/18 0500 12/30/18 0753 01/02/19 0137  WBC 16.2* 13.1* 11.6*  NEUTROABS 13.8* 11.4* 8.3*  HGB 12.7* 13.9 14.5  HCT 39.6 42.8 47.2  MCV 84.1 82.1 83.8  PLT 409* 460* 527*   Basic Metabolic Panel: Recent Labs  Lab 12/30/18 0753 12/30/18 1400 12/31/18 0635 01/01/19  0320 01/02/19 0137  NA  --   --  144 143 145  K  --   --  4.0 3.1* 3.5  CL  --   --  110 108 102  CO2  --   --  29 28 31   GLUCOSE  --   --  160* 94 102*  BUN  --   --  28* 25* 25*  CREATININE  --   --  0.96 0.88 1.08  CALCIUM  --   --  8.3* 7.8* 8.1*  MG 2.3 2.5*  --  2.4  --   PHOS 2.7 1.7*  --   --   --    GFR: Estimated Creatinine Clearance: 74.2 mL/min (by C-G formula based on SCr of 1.08 mg/dL).  Liver Function Tests:  Recent Labs  Lab 12/29/18 0500 12/30/18 0500 12/31/18 0635 01/02/19 0137  AST 32 26 15 25   ALT 20 19 18 28   ALKPHOS 66 69 53 54  BILITOT 0.7 0.9 0.8 0.7  PROT 6.7 6.8 6.7 5.8*  ALBUMIN 2.3* 2.5* 2.6* 2.5*    Recent Results (from the past 240 hour(s))  Blood Culture (routine x 2)     Status: None   Collection Time: 12/27/18  9:43 AM  Result Value Ref Range Status   Specimen Description BLOOD LEFT HAND  Final   Special Requests   Final    BOTTLES DRAWN AEROBIC AND ANAEROBIC Blood Culture adequate volume   Culture   Final    NO GROWTH 5 DAYS Performed at [facility], [address], [place], [place] [address]    Report Status 01/01/2019 FINAL  Final  Blood Culture (routine x 2)     Status: None   Collection Time: 12/27/18  9:44 AM  Result Value Ref Range Status   Specimen Description BLOOD RIGHT FA  Final   Special Requests   Final    BOTTLES DRAWN AEROBIC AND ANAEROBIC Blood Culture adequate volume   Culture   Final    NO GROWTH 5 DAYS Performed at [facility], [address] [place], [place] [address]    Report Status 01/01/2019 FINAL  Final  SARS Coronavirus 2 [facility] order, Performed in [place] hospital lab)     Status: Abnormal   Collection Time: 12/27/18  9:44 AM  Result Value Ref Range Status   SARS Coronavirus 2 POSITIVE (A) NEGATIVE Final    Comment: RESULT CALLED TO, READ BACK BY AND VERIFIED WITH: TOM NAGY AT 5597 ON 12/27/2018 [place]. (NOTE) If result is NEGATIVE SARS-CoV-2 target nucleic acids are NOT DETECTED. The SARS-CoV-2 RNA is generally detectable in upper and lower  respiratory specimens during the acute phase of infection. The lowest  concentration of SARS-CoV-2 viral copies this assay can detect is 250  copies / mL. A negative result does not preclude SARS-CoV-2 infection  and should not be used as the sole basis for treatment or other  patient management decisions.  A negative result may occur with  improper specimen  collection / handling, submission of specimen other  than nasopharyngeal swab, presence of viral mutation(s) within the  areas targeted by this assay, and inadequate number of viral copies  (<250 copies / mL). A negative result must be combined with clinical  observations, patient history, and epidemiological information. If result is POSITIVE SARS-CoV-2 target nucleic acids are DETECTED. Th e SARS-CoV-2 RNA is generally detectable in upper and lower  respiratory specimens during the acute phase of infection.  Positive  results are indicative of active infection with SARS-CoV-2.  Clinical  correlation with patient history and other diagnostic information is  necessary to determine patient infection status.  Positive results do  not rule out bacterial infection or co-infection with other viruses. If result is PRESUMPTIVE POSTIVE SARS-CoV-2 nucleic acids MAY BE PRESENT.   A presumptive positive result was obtained on the submitted specimen  and confirmed on repeat testing.  While 2019 novel coronavirus  (SARS-CoV-2) nucleic acids may be present in the submitted sample  additional confirmatory testing may be necessary for epidemiological  and / or clinical management purposes  to differentiate between  SARS-CoV-2 and other Sarbecovirus currently known to infect humans.  If clinically indicated additional testing with an alternate test  methodology [phone]) is  advised. The SARS-CoV-2 RNA is generally  detectable in upper and lower respiratory specimens during the acute  phase of infection. The expected result is Negative. Fact Sheet for Patients:  StrictlyIdeas.no Fact Sheet for Healthcare Providers: BankingDealers.co.za This test is not yet approved or cleared by the [place] FDA and has been authorized for detection and/or diagnosis of SARS-CoV-2 by FDA under an Emergency Use Authorization (EUA).  This EUA will remain in effect  (meaning this test can be used) for the duration of the COVID-19 declaration under Section 564(b)(1) of the Act, 21 U.S.C. section 360bbb-3(b)(1), unless the authorization is terminated or revoked sooner. Performed at [facility], [address] [place], [place] [address]   Urine culture     Status: Abnormal   Collection Time: 12/27/18 10:38 AM  Result Value Ref Range Status   Specimen Description   Final    URINE, RANDOM Performed at [facility], [address], [place], [place] [address]    Special Requests   Final    NONE Performed at [facility], [place], [place] [address]    Culture (A)  Final    20,000 COLONIES/mL ESCHERICHIA COLI 20,000 COLONIES/mL PROTEUS MIRABILIS Confirmed Extended Spectrum Beta-Lactamase Producer (ESBL).  In bloodstream infections from ESBL organisms, carbapenems are preferred over piperacillin/tazobactam. They are shown to have a lower risk of mortality. ESBL IS IN REFERENCE TO THE ECOLI Performed at [place] [address], [place], [place] [address]    Report Status 12/31/2018 FINAL  Final   Organism ID, Bacteria PROTEUS MIRABILIS (A)  Final   Organism ID, Bacteria ESCHERICHIA COLI (A)  Final      Susceptibility   Escherichia coli - MIC*    AMPICILLIN >=32 RESISTANT Resistant     CEFAZOLIN >=64 RESISTANT Resistant     CEFTRIAXONE >=64 RESISTANT Resistant     CIPROFLOXACIN >=4 RESISTANT Resistant     GENTAMICIN <=1 SENSITIVE Sensitive     IMIPENEM <=0.25 SENSITIVE Sensitive     NITROFURANTOIN <=16 SENSITIVE Sensitive     TRIMETH/SULFA <=20 SENSITIVE Sensitive     AMPICILLIN/SULBACTAM 8 SENSITIVE Sensitive     PIP/TAZO <=4 SENSITIVE Sensitive     Extended ESBL POSITIVE Resistant     * 20,000 COLONIES/mL ESCHERICHIA COLI   Proteus mirabilis - MIC*    AMPICILLIN >=32 RESISTANT Resistant     CEFAZOLIN <=4 SENSITIVE Sensitive     CEFTRIAXONE <=1 SENSITIVE Sensitive     CIPROFLOXACIN >=4  RESISTANT Resistant     GENTAMICIN <=1 SENSITIVE Sensitive     IMIPENEM 2 SENSITIVE Sensitive     NITROFURANTOIN 128 RESISTANT Resistant     TRIMETH/SULFA >=320 RESISTANT Resistant  AMPICILLIN/SULBACTAM <=2 SENSITIVE Sensitive     PIP/TAZO <=4 SENSITIVE Sensitive     * 20,000 COLONIES/mL PROTEUS MIRABILIS  MRSA PCR Screening     Status: None   Collection Time: 12/28/18  5:03 AM  Result Value Ref Range Status   MRSA by PCR NEGATIVE NEGATIVE Final    Comment:        The GeneXpert MRSA Assay (FDA approved for NASAL specimens only), is one component of a comprehensive MRSA colonization surveillance program. It is not intended to diagnose MRSA infection nor to guide or monitor treatment for MRSA infections. Performed at [facility], [place] [address] [place],  [address]      Scheduled Meds: . apixaban  5 mg Oral BID  . aspirin EC  81 mg Oral Daily  . atorvastatin  10 mg Oral Daily  . chlorhexidine  15 mL Mouth Rinse BID  . feeding supplement (ENSURE ENLIVE)  237 mL Oral TID BM  . fluticasone furoate-vilanterol  1 puff Inhalation Daily   And  . umeclidinium bromide  1 puff Inhalation Daily  . furosemide  40 mg Oral Daily  . Gerhardt's butt cream   Topical BID  . mouth rinse  15 mL Mouth Rinse q12n4p  . multivitamin with minerals  1 tablet Oral Daily  . tamsulosin  0.4 mg Oral QPC supper  . vitamin C  500 mg Oral BID  . zinc sulfate  220 mg Oral Daily     LOS: 6 days   Cherene Altes, MD [facility] Office  [phone] Pager - Text Page per Amion  If 7PM-7AM, please contact night-coverage per Amion 01/02/2019, 3:47 PM

## 2019-01-02 NOTE — Progress Notes (Signed)
Occupational Therapy Evaluation Patient Details Name: Timothy Juarez MRN: [id number] DOB: 10-14-1950 Today's Date: 01/02/2019    History of Present Illness Pt is a 68 yo male presenting 12/27/18 from [facility] due to worsening dyspnea and AMS after testing positive for COVID-19 a week prior. Since adm +pna, toxic encephalopathy, AKI, PMH-diastolic HF, COPD (not O2 dependent), Lt AKA, HTN, recurrent DVTs on Eliquis, PVD,    Clinical Impression   PTA, pt lived at [facility] and was able to transfer himself independently for ADL and mobilized around the facility at wc level. Assisted pt with use of bed pan although encouraged him to attempt [facility] ("another time"). Will return to complete mobility OOB. Pt will need rehab at SNF once he returns to [facility]. Will follow acutely to facilitate safe return to PLOF.     Follow Up Recommendations  SNF;Supervision/Assistance - 24 hour    Equipment Recommendations  None recommended by OT    Recommendations for Other Services       Precautions / Restrictions Precautions Precautions: Fall Precaution Comments: monitor sats; L AKA Restrictions Weight Bearing Restrictions: No      Mobility Bed Mobility Overal bed mobility: Needs Assistance Bed Mobility: Rolling Rolling: Supervision         General bed mobility comments: Pt able to roll to R with heavy use of bed rail  Transfers                 General transfer comment: will further assess    Balance                                           ADL either performed or assessed with clinical judgement   ADL Overall ADL's : Needs assistance/impaired     Grooming: Set up;Sitting   Upper Body Bathing: Minimal assistance;Sitting   Lower Body Bathing: Moderate assistance;Bed level   Upper Body Dressing : Minimal assistance;Sitting   Lower Body Dressing: Moderate assistance;Bed level                 General ADL Comments: Requesting bed pan. Max A  to clean pt; Sacral pad changed; wound on buttocks - nsg aware     Vision         Perception     Praxis      Pertinent Vitals/Pain Pain Assessment: Faces Faces Pain Scale: Hurts little more Pain Location: bottom; sore area     Hand Dominance     Extremity/Trunk Assessment Upper Extremity Assessment Upper Extremity Assessment: Generalized weakness   Lower Extremity Assessment Lower Extremity Assessment: Defer to PT evaluation   Cervical / Trunk Assessment Cervical / Trunk Assessment: Normal   Communication Communication Communication: No difficulties   Cognition Arousal/Alertness: Awake/alert Behavior During Therapy: WFL for tasks assessed/performed Overall Cognitive Status: No family/caregiver present to determine baseline cognitive functioning                                 General Comments: most likely [facility]   General Comments       Exercises     Shoulder Instructions      Home Living Family/patient expects to be discharged to:: Skilled nursing facility  Prior Functioning/Environment Level of Independence: Independent with assistive device(s)        Comments: Pt uses w/c at [facility] but is independent with transfers; staff assist with showering; pt is able to sopnge bath himself; staff assists with LB dressing        OT Problem List: Decreased strength;Decreased activity tolerance;Decreased safety awareness;Decreased knowledge of precautions;Cardiopulmonary status limiting activity;Pain      OT Treatment/Interventions: Self-care/ADL training;Therapeutic exercise;DME and/or AE instruction;Therapeutic activities;Patient/family education    OT Goals(Current goals can be found in the care plan section) Acute Rehab OT Goals Patient Stated Goal: be able to get into his w/c and get around like he did before OT Goal Formulation: With patient Time For Goal Achievement:  01/16/19 Potential to Achieve Goals: Good  OT Frequency: Min 2X/week   Barriers to D/C:            Co-evaluation              AM-PAC OT "6 Clicks" Daily Activity     Outcome Measure Help from another person eating meals?: None Help from another person taking care of personal grooming?: A Little Help from another person toileting, which includes using toliet, bedpan, or urinal?: A Lot Help from another person bathing (including washing, rinsing, drying)?: A Lot Help from another person to put on and taking off regular upper body clothing?: A Little Help from another person to put on and taking off regular lower body clothing?: A Lot 6 Click Score: 16   End of Session Equipment Utilized During Treatment: Oxygen(5L) Nurse Communication: Mobility status  Activity Tolerance: Patient tolerated treatment well Patient left: in bed;with call bell/phone within reach;with bed alarm set  OT Visit Diagnosis: Other abnormalities of gait and mobility (R26.89);Muscle weakness (generalized) (M62.81);Pain Pain - part of body: (buttocks)                Time: 1130-1144 OT Time Calculation (min): 14 min Charges:  OT General Charges $OT Visit: 1 Visit OT Evaluation $OT Eval Moderate Complexity: [place], OT/L   Acute OT Clinical Specialist Acute Rehabilitation Services Pager [phone] Office [phone]   [facility] 01/02/2019, 2:02 PM

## 2019-01-03 DIAGNOSIS — I10 Essential (primary) hypertension: Secondary | ICD-10-CM | POA: Diagnosis not present

## 2019-01-03 DIAGNOSIS — E872 Acidosis: Secondary | ICD-10-CM | POA: Diagnosis not present

## 2019-01-03 DIAGNOSIS — N179 Acute kidney failure, unspecified: Secondary | ICD-10-CM | POA: Diagnosis not present

## 2019-01-03 DIAGNOSIS — J9621 Acute and chronic respiratory failure with hypoxia: Secondary | ICD-10-CM | POA: Diagnosis not present

## 2019-01-03 DIAGNOSIS — G92 Toxic encephalopathy: Secondary | ICD-10-CM | POA: Diagnosis not present

## 2019-01-03 DIAGNOSIS — U071 COVID-19: Secondary | ICD-10-CM | POA: Diagnosis not present

## 2019-01-03 DIAGNOSIS — J1289 Other viral pneumonia: Secondary | ICD-10-CM | POA: Diagnosis not present

## 2019-01-03 DIAGNOSIS — J9601 Acute respiratory failure with hypoxia: Secondary | ICD-10-CM | POA: Diagnosis not present

## 2019-01-03 DIAGNOSIS — L89153 Pressure ulcer of sacral region, stage 3: Secondary | ICD-10-CM | POA: Diagnosis not present

## 2019-01-03 DIAGNOSIS — Z89612 Acquired absence of left leg above knee: Secondary | ICD-10-CM | POA: Diagnosis not present

## 2019-01-03 DIAGNOSIS — J181 Lobar pneumonia, unspecified organism: Secondary | ICD-10-CM | POA: Diagnosis not present

## 2019-01-03 DIAGNOSIS — I82512 Chronic embolism and thrombosis of left femoral vein: Secondary | ICD-10-CM | POA: Diagnosis not present

## 2019-01-03 DIAGNOSIS — I739 Peripheral vascular disease, unspecified: Secondary | ICD-10-CM | POA: Diagnosis not present

## 2019-01-03 DIAGNOSIS — A4189 Other specified sepsis: Secondary | ICD-10-CM | POA: Diagnosis not present

## 2019-01-03 LAB — CBC WITH DIFFERENTIAL/PLATELET
Abs Immature Granulocytes: 0.13 10*3/uL — ABNORMAL HIGH (ref 0.00–0.07)
Basophils Absolute: 0 10*3/uL (ref 0.0–0.1)
Basophils Relative: 0 %
Eosinophils Absolute: 0.3 10*3/uL (ref 0.0–0.5)
Eosinophils Relative: 3 %
HCT: 45.8 % (ref 39.0–52.0)
Hemoglobin: 14.4 g/dL (ref 13.0–17.0)
Immature Granulocytes: 1 %
Lymphocytes Relative: 26 %
Lymphs Abs: 2.5 10*3/uL (ref 0.7–4.0)
MCH: 26.5 pg (ref 26.0–34.0)
MCHC: 31.4 g/dL (ref 30.0–36.0)
MCV: 84.2 fL (ref 80.0–100.0)
Monocytes Absolute: 0.7 10*3/uL (ref 0.1–1.0)
Monocytes Relative: 7 %
Neutro Abs: 6.1 10*3/uL (ref 1.7–7.7)
Neutrophils Relative %: 63 %
Platelets: 416 10*3/uL — ABNORMAL HIGH (ref 150–400)
RBC: 5.44 MIL/uL (ref 4.22–5.81)
RDW: 17.7 % — ABNORMAL HIGH (ref 11.5–15.5)
WBC: 9.7 10*3/uL (ref 4.0–10.5)
nRBC: 0 % (ref 0.0–0.2)

## 2019-01-03 LAB — BASIC METABOLIC PANEL
Anion gap: 8 (ref 5–15)
BUN: 25 mg/dL — ABNORMAL HIGH (ref 8–23)
CO2: 33 mmol/L — ABNORMAL HIGH (ref 22–32)
Calcium: 8 mg/dL — ABNORMAL LOW (ref 8.9–10.3)
Chloride: 102 mmol/L (ref 98–111)
Creatinine, Ser: 1.07 mg/dL (ref 0.61–1.24)
GFR calc Af Amer: >60 mL/min (ref >60)
GFR calc non Af Amer: >60 mL/min (ref >60)
Glucose, Bld: 99 mg/dL (ref 70–99)
Potassium: 3.6 mmol/L (ref 3.5–5.1)
Sodium: 143 mmol/L (ref 135–145)

## 2019-01-03 NOTE — Progress Notes (Signed)
PROGRESS NOTE  KAMEN HANKEN  TDS:[id number] DOB: [birth year] DOA: 12/27/2018 PCP: Alvester Morin, MD   Brief Narrative: Timothy Juarez is a 68 y.o. male with a history of HFpEF, COPD, recurrent DVTs on eliquis, PVD s/p left AKA, and anemia who presented from [place] facility due to shortness of breath. He had tested positive for covid-19 infection a week prior and developed dyspnea 5/11 prompting evaluation at [facility] where he was found to be hypoxic and encephalopathic. CXR showed bilateral infiltrates and WBC was elevated with left shift and elevation of inflammatory markers. Initially put on 10L of oxygen this was quickly weaned to 6L O2. Ceftriaxone and azithromycin were given, cultures taken and he was admitted to [facility] where steroids and actemra were given.   Assessment & Plan: Active Problems:   PAD (peripheral artery disease) (HCC)   Pressure ulcer   Essential hypertension   Symptomatic anemia   Chronic deep vein thrombosis (DVT) of left lower extremity (HCC)   Hx of AKA (above knee amputation), left (HCC)   Acute on chronic respiratory failure with hypoxia ([place])   Community acquired bilateral lower lobe pneumonia ([place])   COVID-19 virus infection   High anion gap metabolic acidosis   Severe sepsis ([place])   AKI (acute kidney injury) ([place])   Toxic encephalopathy   Acute respiratory failure with hypoxia ([place])  Acute hypoxic respiratory failure due to CAP and covid-19 infection:  - Continue airborne, contact precautions. PPE including surgical gown, gloves, face shield, cap, shoe covers, and N-95 used during this encounter in a negative pressure room.  - Inflammatory markers have normalized. s/p actemra and steroids - Blood cultures drawn, NGTD.  - Avoid NSAIDs - Stop steroids. Wheezing at admission, now resolved. - Recommend proning and aggressive use of incentive spirometry. May need to DC w/supplemental oxygen.  Sepsis due to CAP and covid-19  infection: Resolved. - Monitor cultures - Medications as above   Acute toxic/metabolic encephalopathy: Resolved. - Delirium precautions  AKI: Suspect hemodynamically mediated and ?obstructive. Improved  Bladder outlet obstruction, acute urinary retention: Foley placed. - Voiding trial this morning. D/w RN.  - Continue flomax and bethanechol (both started in the hospital)  Hyperkalemia: Mild, due to AKI. No ECG changes. Resolved.  Lactic acidosis: Due to hypoxia and sepsis.   Mildly elevated LFTs: Bilirubin has normalized - Continue monitoring.  Morbid obesity: BMI is 37, though this is calculated in the absence of lower leg s/p AKA.  - Weight loss recommended.   Stage III sacral pressure injury: Noted wound going back to 2017.  - WOC consulted.  PAD, hx left AKA:  - Noted, stump site uninfected  History of DVT:  - Continue anticoagulation with eliquis  HTN:  - Monitor  DVT prophylaxis: Eliquis Code Status: Full Family Communication: Daughter by phone Disposition Plan: Pending clinical course. Anticipate return to SNF once hypoxia is stable/improving. Remains on 5L O2 for now.  Consultants:   None  Procedures:   Actemra given 5/11  Antimicrobials:  Ceftriaxone, azithromycin   Subjective: No complaints, feels breathing is slightly better but still very short of breath compared to baseline. No chest pain.   Objective: Vitals:   01/03/19 0357 01/03/19 0500 01/03/19 0902 01/03/19 1242  BP: (!) 107/56  (!) 95/53 105/62  Pulse: 67  92 74  Resp: (!) 145  14 20  Temp: 97.6 F (36.4 C)  97.8 F (36.6 C) 97.9 F (36.6 C)  TempSrc: Oral  Oral Oral  SpO2: 95%  97% 96%  Weight:  104.9 kg    Height:        Intake/Output Summary (Last 24 hours) at 01/03/2019 1355 Last data filed at 01/03/2019 9030 Gross per 24 hour  Intake -  Output 950 ml  Net -950 ml   Filed Weights   12/27/18 1604 01/01/19 0500 01/03/19 0500  Weight: 104.3 kg 104.5 kg 104.9 kg   Gen:  68 y.o. male in no distress Pulm: Nonlabored on supplemental oxygen, slightly tachypneic. No wheezing. CV: Regular rate and rhythm. No murmur, rub, or gallop. No JVD, no dependent edema. GI: Abdomen soft, non-tender, non-distended, with normoactive bowel sounds.  Ext: Warm, Left AKA Skin: No rashes, lesions or ulcers on visualized skin. Neuro: Alert and oriented. No definite focal neurological deficits. Psych: Judgement and insight appear fair.  Data Reviewed: I have personally reviewed following labs and imaging studies  CBC: Recent Labs  Lab 12/28/18 0427 12/29/18 0500 12/30/18 0753 01/02/19 0137 01/03/19 0443  WBC 12.2* 16.2* 13.1* 11.6* 9.7  NEUTROABS  --  13.8* 11.4* 8.3* 6.1  HGB 12.4* 12.7* 13.9 14.5 14.4  HCT 38.0* 39.6 42.8 47.2 45.8  MCV 85.4 84.1 82.1 83.8 84.2  PLT 342 409* 460* 446* 092*   Basic Metabolic Panel: Recent Labs  Lab 12/30/18 0500 12/30/18 0753 12/30/18 1400 12/31/18 0635 01/01/19 0320 01/02/19 0137 01/03/19 0443  NA 142  --   --  144 143 145 143  K 4.7  --   --  4.0 3.1* 3.5 3.6  CL 112*  --   --  110 108 102 102  CO2 19*  --   --  29 28 31  33*  GLUCOSE 137*  --   --  160* 94 102* 99  BUN 21  --   --  28* 25* 25* 25*  CREATININE 0.83  --   --  0.96 0.88 1.08 1.07  CALCIUM 7.9*  --   --  8.3* 7.8* 8.1* 8.0*  MG  --  2.3 2.5*  --  2.4  --   --   PHOS  --  2.7 1.7*  --   --   --   --    GFR: Estimated Creatinine Clearance: 75 mL/min (by C-G formula based on SCr of 1.07 mg/dL). Liver Function Tests: Recent Labs  Lab 12/28/18 0427 12/29/18 0500 12/30/18 0500 12/31/18 0635 01/02/19 0137  AST 55* 32 26 15 25   ALT 22 20 19 18 28   ALKPHOS 62 66 69 53 54  BILITOT 0.9 0.7 0.9 0.8 0.7  PROT 6.6 6.7 6.8 6.7 5.8*  ALBUMIN 2.4* 2.3* 2.5* 2.6* 2.5*   No results for input(s): LIPASE, AMYLASE in the last 168 hours. No results for input(s): AMMONIA in the last 168 hours. Coagulation Profile: No results for input(s): INR, PROTIME in the last  168 hours. Cardiac Enzymes: No results for input(s): CKTOTAL, CKMB, CKMBINDEX, TROPONINI in the last 168 hours. BNP (last 3 results) No results for input(s): PROBNP in the last 8760 hours. HbA1C: No results for input(s): HGBA1C in the last 72 hours. CBG: No results for input(s): GLUCAP in the last 168 hours. Lipid Profile: No results for input(s): CHOL, HDL, LDLCALC, TRIG, CHOLHDL, LDLDIRECT in the last 72 hours. Thyroid Function Tests: No results for input(s): TSH, T4TOTAL, FREET4, T3FREE, THYROIDAB in the last 72 hours. Anemia Panel: Recent Labs    01/02/19 0220  FERRITIN 184   Urine analysis:    Component Value Date/Time   COLORURINE AMBER (A) 12/27/2018  [place] (A) 12/27/2018 1045   APPEARANCEUR Clear 11/29/2012 1529   LABSPEC 1.016 12/27/2018 1045   LABSPEC 1.017 11/29/2012 1529   PHURINE 5.0 12/27/2018 1045   GLUCOSEU NEGATIVE 12/27/2018 1045   GLUCOSEU Negative 11/29/2012 1529   HGBUR SMALL (A) 12/27/2018 [place] 12/27/2018 1045   BILIRUBINUR Negative 11/29/2012 [place] 12/27/2018 1045   PROTEINUR 30 (A) 12/27/2018 1045   NITRITE NEGATIVE 12/27/2018 [place] 12/27/2018 1045   LEUKOCYTESUR 1+ 11/29/2012 1529   Recent Results (from the past 240 hour(s))  Blood Culture (routine x 2)     Status: None   Collection Time: 12/27/18  9:43 AM  Result Value Ref Range Status   Specimen Description BLOOD LEFT HAND  Final   Special Requests   Final    BOTTLES DRAWN AEROBIC AND ANAEROBIC Blood Culture adequate volume   Culture   Final    NO GROWTH 5 DAYS Performed at [facility], [address], [place], [place] [address]    Report Status 01/01/2019 FINAL  Final  Blood Culture (routine x 2)     Status: None   Collection Time: 12/27/18  9:44 AM  Result Value Ref Range Status   Specimen Description BLOOD RIGHT FA  Final   Special Requests   Final    BOTTLES DRAWN AEROBIC AND ANAEROBIC  Blood Culture adequate volume   Culture   Final    NO GROWTH 5 DAYS Performed at [facility], [address], [place], [place] [address]    Report Status 01/01/2019 FINAL  Final  SARS Coronavirus 2 [facility] order, Performed in [place] hospital lab)     Status: Abnormal   Collection Time: 12/27/18  9:44 AM  Result Value Ref Range Status   SARS Coronavirus 2 POSITIVE (A) NEGATIVE Final    Comment: RESULT CALLED TO, READ BACK BY AND VERIFIED WITH: TOM NAGY AT 5621 ON 12/27/2018 [place]. (NOTE) If result is NEGATIVE SARS-CoV-2 target nucleic acids are NOT DETECTED. The SARS-CoV-2 RNA is generally detectable in upper and lower  respiratory specimens during the acute phase of infection. The lowest  concentration of SARS-CoV-2 viral copies this assay can detect is 250  copies / mL. A negative result does not preclude SARS-CoV-2 infection  and should not be used as the sole basis for treatment or other  patient management decisions.  A negative result may occur with  improper specimen collection / handling, submission of specimen other  than nasopharyngeal swab, presence of viral mutation(s) within the  areas targeted by this assay, and inadequate number of viral copies  (<250 copies / mL). A negative result must be combined with clinical  observations, patient history, and epidemiological information. If result is POSITIVE SARS-CoV-2 target nucleic acids are DETECTED. Th e SARS-CoV-2 RNA is generally detectable in upper and lower  respiratory specimens during the acute phase of infection.  Positive  results are indicative of active infection with SARS-CoV-2.  Clinical  correlation with patient history and other diagnostic information is  necessary to determine patient infection status.  Positive results do  not rule out bacterial infection or co-infection with other viruses. If result is PRESUMPTIVE POSTIVE SARS-CoV-2 nucleic acids MAY BE PRESENT.   A presumptive positive  result was obtained on the submitted specimen  and confirmed on repeat testing.  While 2019 novel coronavirus  (SARS-CoV-2) nucleic acids may be present in the submitted sample  additional confirmatory testing  may be necessary for epidemiological  and / or clinical management purposes  to differentiate between  SARS-CoV-2 and other Sarbecovirus currently known to infect humans.  If clinically indicated additional testing with an alternate test  methodology [id number]) is  advised. The SARS-CoV-2 RNA is generally  detectable in upper and lower respiratory specimens during the acute  phase of infection. The expected result is Negative. Fact Sheet for Patients:  StrictlyIdeas.no Fact Sheet for Healthcare Providers: BankingDealers.co.za This test is not yet approved or cleared by the [place] FDA and has been authorized for detection and/or diagnosis of SARS-CoV-2 by FDA under an Emergency Use Authorization (EUA).  This EUA will remain in effect (meaning this test can be used) for the duration of the COVID-19 declaration under Section 564(b)(1) of the Act, 21 U.S.C. section 360bbb-3(b)(1), unless the authorization is terminated or revoked sooner. Performed at [facility], [address] [place], [place] [address]   Urine culture     Status: Abnormal   Collection Time: 12/27/18 10:38 AM  Result Value Ref Range Status   Specimen Description   Final    URINE, RANDOM Performed at [facility], [address] [place], [place] [address]    Special Requests   Final    NONE Performed at [facility], [place], [place] [address]    Culture (A)  Final    20,000 COLONIES/mL ESCHERICHIA COLI 20,000 COLONIES/mL PROTEUS MIRABILIS Confirmed Extended Spectrum Beta-Lactamase Producer (ESBL).  In bloodstream infections from ESBL organisms, carbapenems are preferred over piperacillin/tazobactam. They are  shown to have a lower risk of mortality. ESBL IS IN REFERENCE TO THE ECOLI Performed at [place] [address] [place], [place] [address]    Report Status 12/31/2018 FINAL  Final   Organism ID, Bacteria PROTEUS MIRABILIS (A)  Final   Organism ID, Bacteria ESCHERICHIA COLI (A)  Final      Susceptibility   Escherichia coli - MIC*    AMPICILLIN >=32 RESISTANT Resistant     CEFAZOLIN >=64 RESISTANT Resistant     CEFTRIAXONE >=64 RESISTANT Resistant     CIPROFLOXACIN >=4 RESISTANT Resistant     GENTAMICIN <=1 SENSITIVE Sensitive     IMIPENEM <=0.25 SENSITIVE Sensitive     NITROFURANTOIN <=16 SENSITIVE Sensitive     TRIMETH/SULFA <=20 SENSITIVE Sensitive     AMPICILLIN/SULBACTAM 8 SENSITIVE Sensitive     PIP/TAZO <=4 SENSITIVE Sensitive     Extended ESBL POSITIVE Resistant     * 20,000 COLONIES/mL ESCHERICHIA COLI   Proteus mirabilis - MIC*    AMPICILLIN >=32 RESISTANT Resistant     CEFAZOLIN <=4 SENSITIVE Sensitive     CEFTRIAXONE <=1 SENSITIVE Sensitive     CIPROFLOXACIN >=4 RESISTANT Resistant     GENTAMICIN <=1 SENSITIVE Sensitive     IMIPENEM 2 SENSITIVE Sensitive     NITROFURANTOIN 128 RESISTANT Resistant     TRIMETH/SULFA >=320 RESISTANT Resistant     AMPICILLIN/SULBACTAM <=2 SENSITIVE Sensitive     PIP/TAZO <=4 SENSITIVE Sensitive     * 20,000 COLONIES/mL PROTEUS MIRABILIS  MRSA PCR Screening     Status: None   Collection Time: 12/28/18  5:03 AM  Result Value Ref Range Status   MRSA by PCR NEGATIVE NEGATIVE Final    Comment:        The GeneXpert MRSA Assay (FDA approved for NASAL specimens only), is one component of a comprehensive MRSA colonization surveillance program. It is not intended to diagnose MRSA infection  nor to guide or monitor treatment for MRSA infections. Performed at [facility], [place] [address], [place], [place] [address]       Radiology Studies: No results found.  Scheduled Meds: . apixaban  5 mg Oral BID  .  aspirin EC  81 mg Oral Daily  . atorvastatin  10 mg Oral Daily  . bethanechol  10 mg Oral TID  . chlorhexidine  15 mL Mouth Rinse BID  . feeding supplement (ENSURE ENLIVE)  237 mL Oral TID BM  . fluticasone furoate-vilanterol  1 puff Inhalation Daily   And  . umeclidinium bromide  1 puff Inhalation Daily  . furosemide  40 mg Oral Daily  . Gerhardt's butt cream   Topical BID  . mouth rinse  15 mL Mouth Rinse q12n4p  . multivitamin with minerals  1 tablet Oral Daily  . tamsulosin  0.4 mg Oral QPC supper  . vitamin C  500 mg Oral BID  . zinc sulfate  220 mg Oral Daily   Continuous Infusions: . sodium chloride Stopped (12/28/18 1825)     LOS: 7 days   Time spent: 35 minutes.  Patrecia Pour, MD [facility] [email or web address] Password [email or web address] 01/03/2019, 1:55 PM

## 2019-01-03 NOTE — Progress Notes (Signed)
  PT Cancellation Note  Patient Details Name: Timothy Juarez MRN: [id number] DOB: 31-Jul-1951   Cancelled Treatment:    Reason Eval/Treat Not Completed: Patient declined, x 2, stated that he had been OOB all day( He had not).    Claretha Cooper 01/03/2019, 5:38 PM   [place] Pager [phone] Office [phone]

## 2019-01-04 DIAGNOSIS — Z89612 Acquired absence of left leg above knee: Secondary | ICD-10-CM | POA: Diagnosis not present

## 2019-01-04 DIAGNOSIS — A4189 Other specified sepsis: Secondary | ICD-10-CM | POA: Diagnosis not present

## 2019-01-04 DIAGNOSIS — J189 Pneumonia, unspecified organism: Secondary | ICD-10-CM | POA: Diagnosis not present

## 2019-01-04 DIAGNOSIS — I739 Peripheral vascular disease, unspecified: Secondary | ICD-10-CM | POA: Diagnosis not present

## 2019-01-04 DIAGNOSIS — J9621 Acute and chronic respiratory failure with hypoxia: Secondary | ICD-10-CM | POA: Diagnosis not present

## 2019-01-04 DIAGNOSIS — I82512 Chronic embolism and thrombosis of left femoral vein: Secondary | ICD-10-CM | POA: Diagnosis not present

## 2019-01-04 DIAGNOSIS — L89153 Pressure ulcer of sacral region, stage 3: Secondary | ICD-10-CM | POA: Diagnosis not present

## 2019-01-04 DIAGNOSIS — Z7401 Bed confinement status: Secondary | ICD-10-CM | POA: Diagnosis not present

## 2019-01-04 DIAGNOSIS — J9600 Acute respiratory failure, unspecified whether with hypoxia or hypercapnia: Secondary | ICD-10-CM | POA: Diagnosis not present

## 2019-01-04 DIAGNOSIS — J9601 Acute respiratory failure with hypoxia: Secondary | ICD-10-CM | POA: Diagnosis not present

## 2019-01-04 DIAGNOSIS — J1289 Other viral pneumonia: Secondary | ICD-10-CM | POA: Diagnosis not present

## 2019-01-04 DIAGNOSIS — G92 Toxic encephalopathy: Secondary | ICD-10-CM | POA: Diagnosis not present

## 2019-01-04 DIAGNOSIS — N179 Acute kidney failure, unspecified: Secondary | ICD-10-CM | POA: Diagnosis not present

## 2019-01-04 DIAGNOSIS — E872 Acidosis: Secondary | ICD-10-CM | POA: Diagnosis not present

## 2019-01-04 DIAGNOSIS — U071 COVID-19: Secondary | ICD-10-CM | POA: Diagnosis not present

## 2019-01-04 DIAGNOSIS — M2550 Pain in unspecified joint: Secondary | ICD-10-CM | POA: Diagnosis not present

## 2019-01-04 DIAGNOSIS — I10 Essential (primary) hypertension: Secondary | ICD-10-CM | POA: Diagnosis not present

## 2019-01-04 LAB — BASIC METABOLIC PANEL
Anion gap: 9 (ref 5–15)
BUN: 24 mg/dL — ABNORMAL HIGH (ref 8–23)
CO2: 33 mmol/L — ABNORMAL HIGH (ref 22–32)
Calcium: 8.1 mg/dL — ABNORMAL LOW (ref 8.9–10.3)
Chloride: 100 mmol/L (ref 98–111)
Creatinine, Ser: 1.1 mg/dL (ref 0.61–1.24)
GFR calc Af Amer: >60 mL/min (ref >60)
GFR calc non Af Amer: >60 mL/min (ref >60)
Glucose, Bld: 93 mg/dL (ref 70–99)
Potassium: 3.8 mmol/L (ref 3.5–5.1)
Sodium: 142 mmol/L (ref 135–145)

## 2019-01-04 MED ORDER — OXYCODONE HCL 10 MG PO TABS: 10.0000 mg | ORAL_TABLET | Freq: Three times a day (TID) | ORAL | 0 refills | Status: DC | PRN

## 2019-01-04 MED ORDER — FENTANYL 12 MCG/HR TD PT72: 1.0000 | MEDICATED_PATCH | TRANSDERMAL | 0 refills | Status: DC

## 2019-01-04 MED ORDER — LORAZEPAM 0.5 MG PO TABS: 0.5000 mg | ORAL_TABLET | Freq: Every day | ORAL | 0 refills | Status: DC

## 2019-01-04 NOTE — Progress Notes (Signed)
Pt has been encouraged to get out of bed to ambulate or move to chair x 3 this morning, he states "No, I was up all day yesterday, I'm not getting up" The third time pt expressed bluntly "I am not getting up" Discussed with pt to sit up in chair for lunch, pt rolled his eyes.

## 2019-01-04 NOTE — TOC Transition Note (Addendum)
Transition of Care [facility]) - CM/SW Discharge Note   Patient Details  Name: BERKLEY CRONKRIGHT MRN: [id number] Date of Birth: [birth year]  [facility] of [facility]) CM/SW Contact:  Alberteen Sam, LCSW Phone [phone] 01/04/2019, 12:39 PM   Clinical Narrative:     Patient will DC to: [place] Anticipated DC date: 01/04/2019 Family notified:Alicia Transport JE:HUDJ  Per MD patient ready for DC to [facility]. RN, patient, patient's family, and facility notified of DC. Discharge Summary sent to facility. RN given number for report [phone] Room 316B. DC packet on chart. Ambulance transport requested for patient for 2:00 pm per nurse request. [place] signing off.  [place], [place]   Final next level of care: Skilled Nursing Facility Barriers to Discharge: No Barriers Identified   Patient Goals and CMS Choice Patient states their goals for this hospitalization and ongoing recovery are:: family would like patient to return to [facility].gov Compare Post Acute Care list provided to:: Patient Represenative (must comment)(Alicia (daughter)) Choice offered to / list presented to : Adult Children(Alicia)  Discharge Placement PASRR number recieved: 01/04/19            Patient chooses bed at: [facility] Patient to be transferred to facility by: [place] Name of family member notified: Elmo Putt Patient and family notified of of transfer: 01/04/19  Discharge Plan and Services In-house Referral: Clinical Social Work Discharge Planning Services: NA Post Acute Care Choice: [place]          DME Arranged: N/A DME Agency: NA       HH Arranged: NA HH Agency: NA        Social Determinants of Health (SDOH) Interventions     Readmission Risk Interventions No flowsheet data found.

## 2019-01-04 NOTE — Progress Notes (Addendum)
Report has been called to [facility] at [facility], she is aware transport will be here around 1400 to transport pt back to facility. Also pt is aware of his discharge back to the facility.

## 2019-01-04 NOTE — Progress Notes (Signed)
Updated daughter Elmo Putt regarding father's care. She will be kept updated of any changes.

## 2019-01-04 NOTE — Discharge Summary (Addendum)
Physician Discharge Summary  Timothy Juarez:[id number] DOB: Sep 14, 1950 DOA: 12/27/2018  PCP: Alvester Morin, MD  Admit date: 12/27/2018 Discharge date: 01/04/2019  Admitted From: SNF Disposition: SNF   Recommendations for Outpatient Follow-up:  1. Follow up with PCP in 1-2 weeks 2. Please obtain BMP/CBC in one week  Home Health: N/A Equipment/Devices: 3L O2 Discharge Condition: Stable CODE STATUS: Full Diet recommendation: Heart healthy  Brief/Interim Summary: Timothy Juarez is a 68 y.o. male with a history of HFpEF, COPD, recurrent DVTs on eliquis, PVD s/p left AKA, and anemia who presented from [facility] facility due to shortness of breath. He had tested positive for covid-19 infection a week prior and developed dyspnea 5/11 prompting evaluation at [facility] where he was found to be hypoxic and encephalopathic. CXR showed bilateral infiltrates and WBC was elevated with left shift and elevation of inflammatory markers. Initially put on 10L of oxygen this was quickly weaned to 6L O2. Ceftriaxone and azithromycin were given, cultures taken and he was admitted to [facility] where steroids and actemra were given with subsequent improvement in hypoxia and decline in inflammatory markers.  Discharge Diagnoses:  Active Problems:   PAD (peripheral artery disease) (HCC)   Pressure ulcer   Essential hypertension   Symptomatic anemia   Chronic deep vein thrombosis (DVT) of left lower extremity (HCC)   Hx of AKA (above knee amputation), left (HCC)   Acute on chronic respiratory failure with hypoxia ([place])   Community acquired bilateral lower lobe pneumonia ([place])   COVID-19 virus infection   High anion gap metabolic acidosis   Severe sepsis ([place])   AKI (acute kidney injury) ([place])   Toxic encephalopathy   Acute respiratory failure with hypoxia ([place])  Acute hypoxic respiratory failure due to CAP and covid-19 infection:  - Continue covid precautions. - Inflammatory markers  have normalized. s/p actemra and steroids - Blood cultures drawn but cancelled - Avoid NSAIDs - Stop steroids. Wheezing at admission, now resolved. - Will need ongoing evaluation for oxygen. DC w/supplemental oxygen.  Sepsis due to CAP and covid-19 infection: Resolved. - Monitor cultures - Medications as above   Acute toxic/metabolic encephalopathy: Resolved. - Delirium precautions  AKI: Suspect hemodynamically mediated and obstructive. Improved  Bladder outlet obstruction, acute urinary retention: Resolved. - Voiding trial passed 5/18.   Hyperkalemia: Mild, due to AKI. No ECG changes. Resolved.  Lactic acidosis: Due to hypoxia and sepsis.   Mildly elevated LFTs: Bilirubin has normalized - Continue monitoring.  Morbid obesity: BMI is 37, though this is calculated in the absence of lower leg s/p AKA.  - Weight loss recommended.   Stage III sacral pressure injury: Noted wound going back to 2017.  - WOC consulted. Offload as able, optimize nutrition.  PAD, hx left AKA:  - Noted, stump site uninfected  History of DVT:  - Continue anticoagulation with eliquis  HTN: No changes to regimen made.  Discharge Instructions Discharge Instructions    Diet - low sodium heart healthy   Complete by:  As directed    For home use only DME oxygen   Complete by:  As directed    Mode or (Route):  Nasal cannula   Liters per Minute:  3   Frequency:  Continuous (stationary and portable oxygen unit needed)   Oxygen delivery system:  Gas   Increase activity slowly   Complete by:  As directed      Allergies as of 01/04/2019   No Known Allergies     Medication  List    STOP taking these medications   azithromycin 250 MG tablet Commonly known as:  ZITHROMAX     TAKE these medications   acetaminophen 325 MG tablet Commonly known as:  TYLENOL Take 650 mg by mouth every 4 (four) hours as needed for fever.   aspirin EC 81 MG tablet Take 1 tablet (81 mg total) by mouth  daily.   atorvastatin 10 MG tablet Commonly known as:  Lipitor Take 1 tablet (10 mg total) by mouth daily.   baclofen 10 MG tablet Commonly known as:  LIORESAL Take 10 mg by mouth 3 (three) times daily. Hold for sedation   docusate sodium 100 MG capsule Commonly known as:  COLACE Take 100 mg by mouth daily as needed for mild constipation.   Eliquis 5 MG Tabs tablet Generic drug:  apixaban Take 5 mg by mouth 2 (two) times daily.   fentaNYL 12 MCG/HR Commonly known as:  [place] 1 patch onto the skin every 3 (three) days. What changed:  how much to take   ferrous sulfate 324 (65 Fe) MG Tbec Take 324 mg by mouth daily.   Fish Oil 1000 MG Caps Take 1,000 mg by mouth every morning.   furosemide 40 MG tablet Commonly known as:  LASIX Take 1 tablet (40 mg total) by mouth daily.   gabapentin 100 MG capsule Commonly known as:  NEURONTIN Take 100 mg by mouth at bedtime.   guaiFENesin 100 MG/5ML Soln Commonly known as:  ROBITUSSIN Take 15 mLs by mouth 3 (three) times daily as needed for cough or to loosen phlegm.   loperamide 2 MG capsule Commonly known as:  IMODIUM Take 2 mg by mouth 3 (three) times daily as needed for diarrhea or loose stools.   LORazepam 0.5 MG tablet Commonly known as:  ATIVAN Take 1 tablet (0.5 mg total) by mouth at bedtime. Also may give one tablet as needed for anxiety/insomnia What changed:  when to take this   Melatonin 5 MG Tabs Take 5 mg by mouth at bedtime as needed (insomnia).   nitroGLYCERIN 0.4 MG SL tablet Commonly known as:  NITROSTAT Place 0.14 mg under the tongue every 5 (five) minutes as needed for chest pain.   Oxycodone HCl 10 MG Tabs Take 1 tablet (10 mg total) by mouth 3 (three) times daily as needed (moderate pain).   oxymetazoline 0.05 % nasal spray Commonly known as:  AFRIN Place 2 sprays into both nostrils 2 (two) times daily as needed for congestion. For nose bleeds   potassium chloride SA 20 MEQ  tablet Commonly known as:  K-DUR Take 20 mEq by mouth daily. With or after meal   sodium chloride 0.65 % Soln nasal spray Commonly known as:  OCEAN Place 2 sprays into both nostrils 2 (two) times daily as needed for congestion.   Trelegy Ellipta 100-62.5-25 MCG/INH Aepb Generic drug:  Fluticasone-Umeclidin-Vilant Inhale 1 puff into the lungs daily.            Durable Medical Equipment  (From admission, onward)         Start     Ordered   01/04/19 0000  For home use only DME oxygen    Question Answer Comment  Mode or (Route) Nasal cannula   Liters per Minute 3   Frequency Continuous (stationary and portable oxygen unit needed)   Oxygen delivery system Gas      01/04/19 1229         Follow-up Information  Alvester Morin, MD Follow up.   Specialty:  Family Medicine Contact information: [place] [address] [phone]          No Known Allergies  Consultations:  None  Procedures/Studies: Dg Chest Port 1 View  Result Date: 12/31/2018 CLINICAL DATA:  Acute respiratory disease due to COVID-19 EXAM: PORTABLE CHEST 1 VIEW COMPARISON:  12/27/2018 FINDINGS: Diffuse bilateral airspace disease with mild interval improvement. Chronic scarring left upper lobe. No significant effusion. Heart size normal. IMPRESSION: Bilateral airspace disease with mild interval improvement. Electronically Signed   By: Franchot Gallo M.D.   On: 12/31/2018 08:51   Dg Chest Port 1 View  Result Date: 12/27/2018 CLINICAL DATA:  Difficulty breathing.  Reported COVID-19 positive EXAM: PORTABLE CHEST 1 VIEW COMPARISON:  October 10, 2017 FINDINGS: There is patchy airspace opacity throughout the lungs bilaterally, most notably in the lung bases. There is patchy consolidation in the lung bases. There is fibrosis in each upper lobe which is essentially stable compared to the prior study. Heart size and pulmonary vascularity normal. No adenopathy. No bone lesions.  IMPRESSION: Patchy airspace disease in the lungs bilaterally, most notably in the lower lobes where there are is patchy consolidation. Fibrosis and bullous disease in the upper lobes remains. Stable cardiac silhouette. Note that the findings in the lower lung regions may be consistent with atypical pneumonia including viral type pneumonitis. Electronically Signed   By: Lowella Grip III M.D.   On: 12/27/2018 09:59    Subjective: No complaints now. No shortness of breath, eating well, voiding without difficulty. No chest or other pain.  Discharge Exam: Vitals:   01/04/19 0750 01/04/19 1142  BP:    Pulse:    Resp:    Temp: 97.8 F (36.6 C) 97.9 F (36.6 C)  SpO2:     General: Pt is alert, awake, not in acute distress Cardiovascular: RRR, S1/S2 +, no rubs, no gallops Respiratory: Nonlabored, clear bilaterally with no wheezing, no rhonchi Abdominal: Soft, NT, ND, bowel sounds + Extremities: [name] amputation site stable.   Labs: BNP (last 3 results) No results for input(s): BNP in the last 8760 hours. Basic Metabolic Panel: Recent Labs  Lab 12/30/18 0753 12/30/18 1400 12/31/18 0635 01/01/19 0320 01/02/19 0137 01/03/19 0443 01/04/19 0515  NA  --   --  144 143 145 143 142  K  --   --  4.0 3.1* 3.5 3.6 3.8  CL  --   --  110 108 102 102 100  CO2  --   --  29 28 31  33* 33*  GLUCOSE  --   --  160* 94 102* 99 93  BUN  --   --  28* 25* 25* 25* 24*  CREATININE  --   --  0.96 0.88 1.08 1.07 1.10  CALCIUM  --   --  8.3* 7.8* 8.1* 8.0* 8.1*  MG 2.3 2.5*  --  2.4  --   --   --   PHOS 2.7 1.7*  --   --   --   --   --    Liver Function Tests: Recent Labs  Lab 12/29/18 0500 12/30/18 0500 12/31/18 0635 01/02/19 0137  AST 32 26 15 25   ALT 20 19 18 28   ALKPHOS 66 69 53 54  BILITOT 0.7 0.9 0.8 0.7  PROT 6.7 6.8 6.7 5.8*  ALBUMIN 2.3* 2.5* 2.6* 2.5*   No results for input(s): LIPASE, AMYLASE in the last 168 hours. No results for input(s):  AMMONIA in the last 168  hours. CBC: Recent Labs  Lab 12/29/18 0500 12/30/18 0753 01/02/19 0137 01/03/19 0443  WBC 16.2* 13.1* 11.6* 9.7  NEUTROABS 13.8* 11.4* 8.3* 6.1  HGB 12.7* 13.9 14.5 14.4  HCT 39.6 42.8 47.2 45.8  MCV 84.1 82.1 83.8 84.2  PLT 409* 460* 446* 416*   Cardiac Enzymes: No results for input(s): CKTOTAL, CKMB, CKMBINDEX, TROPONINI in the last 168 hours. BNP: Invalid input(s): POCBNP CBG: No results for input(s): GLUCAP in the last 168 hours. D-Dimer No results for input(s): DDIMER in the last 72 hours. Hgb A1c No results for input(s): HGBA1C in the last 72 hours. Lipid Profile No results for input(s): CHOL, HDL, LDLCALC, TRIG, CHOLHDL, LDLDIRECT in the last 72 hours. Thyroid function studies No results for input(s): TSH, T4TOTAL, T3FREE, THYROIDAB in the last 72 hours.  Invalid input(s): FREET3 Anemia work up Recent Labs    01/02/19 0220  FERRITIN 184   Urinalysis    Component Value Date/Time   COLORURINE AMBER (A) 12/27/2018 1045   APPEARANCEUR HAZY (A) 12/27/2018 1045   APPEARANCEUR Clear 11/29/2012 1529   LABSPEC 1.016 12/27/2018 1045   LABSPEC 1.017 11/29/2012 1529   PHURINE 5.0 12/27/2018 [place] 12/27/2018 1045   GLUCOSEU Negative 11/29/2012 1529   HGBUR SMALL (A) 12/27/2018 1045   BILIRUBINUR NEGATIVE 12/27/2018 1045   BILIRUBINUR Negative 11/29/2012 1529   KETONESUR NEGATIVE 12/27/2018 1045   PROTEINUR 30 (A) 12/27/2018 1045   NITRITE NEGATIVE 12/27/2018 [place] 12/27/2018 1045   LEUKOCYTESUR 1+ 11/29/2012 1529    Microbiology Recent Results (from the past 240 hour(s))  Blood Culture (routine x 2)     Status: None   Collection Time: 12/27/18  9:43 AM  Result Value Ref Range Status   Specimen Description BLOOD LEFT HAND  Final   Special Requests   Final    BOTTLES DRAWN AEROBIC AND ANAEROBIC Blood Culture adequate volume   Culture   Final    NO GROWTH 5 DAYS Performed at [facility], [address], [place], [place] [address]    Report Status 01/01/2019 FINAL  Final  Blood Culture (routine x 2)     Status: None   Collection Time: 12/27/18  9:44 AM  Result Value Ref Range Status   Specimen Description BLOOD RIGHT FA  Final   Special Requests   Final    BOTTLES DRAWN AEROBIC AND ANAEROBIC Blood Culture adequate volume   Culture   Final    NO GROWTH 5 DAYS Performed at [facility], [address] [place], [place] [address]    Report Status 01/01/2019 FINAL  Final  SARS Coronavirus 2 [facility] order, Performed in [place] hospital lab)     Status: Abnormal   Collection Time: 12/27/18  9:44 AM  Result Value Ref Range Status   SARS Coronavirus 2 POSITIVE (A) NEGATIVE Final    Comment: RESULT CALLED TO, READ BACK BY AND VERIFIED WITH: TOM NAGY AT 6237 ON 12/27/2018 [place]. (NOTE) If result is NEGATIVE SARS-CoV-2 target nucleic acids are NOT DETECTED. The SARS-CoV-2 RNA is generally detectable in upper and lower  respiratory specimens during the acute phase of infection. The lowest  concentration of SARS-CoV-2 viral copies this assay can detect is 250  copies / mL. A negative result does not preclude SARS-CoV-2 infection  and should not be used as the sole basis for treatment or other  patient management decisions.  A negative result may occur  with  improper specimen collection / handling, submission of specimen other  than nasopharyngeal swab, presence of viral mutation(s) within the  areas targeted by this assay, and inadequate number of viral copies  (<250 copies / mL). A negative result must be combined with clinical  observations, patient history, and epidemiological information. If result is POSITIVE SARS-CoV-2 target nucleic acids are DETECTED. Th e SARS-CoV-2 RNA is generally detectable in upper and lower  respiratory specimens during the acute phase of infection.  Positive  results are indicative of active infection with SARS-CoV-2.  Clinical  correlation with  patient history and other diagnostic information is  necessary to determine patient infection status.  Positive results do  not rule out bacterial infection or co-infection with other viruses. If result is PRESUMPTIVE POSTIVE SARS-CoV-2 nucleic acids MAY BE PRESENT.   A presumptive positive result was obtained on the submitted specimen  and confirmed on repeat testing.  While 2019 novel coronavirus  (SARS-CoV-2) nucleic acids may be present in the submitted sample  additional confirmatory testing may be necessary for epidemiological  and / or clinical management purposes  to differentiate between  SARS-CoV-2 and other Sarbecovirus currently known to infect humans.  If clinically indicated additional testing with an alternate test  methodology [phone]) is  advised. The SARS-CoV-2 RNA is generally  detectable in upper and lower respiratory specimens during the acute  phase of infection. The expected result is Negative. Fact Sheet for Patients:  StrictlyIdeas.no Fact Sheet for Healthcare Providers: BankingDealers.co.za This test is not yet approved or cleared by the [place] FDA and has been authorized for detection and/or diagnosis of SARS-CoV-2 by FDA under an Emergency Use Authorization (EUA).  This EUA will remain in effect (meaning this test can be used) for the duration of the COVID-19 declaration under Section 564(b)(1) of the Act, 21 U.S.C. section 360bbb-3(b)(1), unless the authorization is terminated or revoked sooner. Performed at [facility], [address] [place], [place] [address]   Urine culture     Status: Abnormal   Collection Time: 12/27/18 10:38 AM  Result Value Ref Range Status   Specimen Description   Final    URINE, RANDOM Performed at [facility], [address], [place], [place] [address]    Special Requests   Final    NONE Performed at [facility], [place], [place] [address]    Culture (A)  Final    20,000 COLONIES/mL ESCHERICHIA COLI 20,000 COLONIES/mL PROTEUS MIRABILIS Confirmed Extended Spectrum Beta-Lactamase Producer (ESBL).  In bloodstream infections from ESBL organisms, carbapenems are preferred over piperacillin/tazobactam. They are shown to have a lower risk of mortality. ESBL IS IN REFERENCE TO THE ECOLI Performed at [facility] [address] [place], [place] [address]    Report Status 12/31/2018 FINAL  Final   Organism ID, Bacteria PROTEUS MIRABILIS (A)  Final   Organism ID, Bacteria ESCHERICHIA COLI (A)  Final      Susceptibility   Escherichia coli - MIC*    AMPICILLIN >=32 RESISTANT Resistant     CEFAZOLIN >=64 RESISTANT Resistant     CEFTRIAXONE >=64 RESISTANT Resistant     CIPROFLOXACIN >=4 RESISTANT Resistant     GENTAMICIN <=1 SENSITIVE Sensitive     IMIPENEM <=0.25 SENSITIVE Sensitive     NITROFURANTOIN <=16 SENSITIVE Sensitive     TRIMETH/SULFA <=20 SENSITIVE Sensitive     AMPICILLIN/SULBACTAM 8 SENSITIVE Sensitive     PIP/TAZO <=4 SENSITIVE Sensitive  Extended ESBL POSITIVE Resistant     * 20,000 COLONIES/mL ESCHERICHIA COLI   Proteus mirabilis - MIC*    AMPICILLIN >=32 RESISTANT Resistant     CEFAZOLIN <=4 SENSITIVE Sensitive     CEFTRIAXONE <=1 SENSITIVE Sensitive     CIPROFLOXACIN >=4 RESISTANT Resistant     GENTAMICIN <=1 SENSITIVE Sensitive     IMIPENEM 2 SENSITIVE Sensitive     NITROFURANTOIN 128 RESISTANT Resistant     TRIMETH/SULFA >=320 RESISTANT Resistant     AMPICILLIN/SULBACTAM <=2 SENSITIVE Sensitive     PIP/TAZO <=4 SENSITIVE Sensitive     * 20,000 COLONIES/mL PROTEUS MIRABILIS  MRSA PCR Screening     Status: None   Collection Time: 12/28/18  5:03 AM  Result Value Ref Range Status   MRSA by PCR NEGATIVE NEGATIVE Final    Comment:        The GeneXpert MRSA Assay (FDA approved for NASAL specimens only), is one component of a comprehensive MRSA colonization surveillance  program. It is not intended to diagnose MRSA infection nor to guide or monitor treatment for MRSA infections. Performed at [facility], [place] [address], [place], [place] [address]     Time coordinating discharge: Approximately 40 minutes  Patrecia Pour, MD  [facility] 01/04/2019, 12:29 PM

## 2019-01-04 NOTE — Progress Notes (Signed)
Pt noted to have small decub stage 2 on coccyx upon cleaning skin and applying dressing.

## 2019-01-04 NOTE — NC FL2 (Signed)
[place] LEVEL OF CARE SCREENING TOOL     IDENTIFICATION  Patient Name: Timothy Juarez Birthdate: [birth year] Sex: male Admission Date (Current Location): 12/27/2018  [facility] and [place] Number:  [employer] and Address:  [facility], [place] [address], [place], [place] [address]([place])      Provider Number: [id number]  Attending Physician Name and Address:  Patrecia Pour, MD  Relative Name and Phone Number:  Elmo Putt (daughter) [phone]    Current Level of Care: Hospital Recommended Level of Care: [place] Prior Approval Number:    Date Approved/Denied: 04/07/13 PASRR Number: [id number]  Discharge Plan: SNF    Current Diagnoses: Patient Active Problem List   Diagnosis Date Noted  . Acute on chronic respiratory failure with hypoxia ([place]) 12/27/2018  . Community acquired bilateral lower lobe pneumonia ([place]) 12/27/2018  . COVID-19 virus infection 12/27/2018  . High anion gap metabolic acidosis 72/53/6644  . Severe sepsis ([place]) 12/27/2018  . AKI (acute kidney injury) ([place]) 12/27/2018  . Toxic encephalopathy 12/27/2018  . Acute respiratory failure with hypoxia ([place]) 12/27/2018  . Hx of AKA (above knee amputation), left ([place]) 11/02/2018  . Symptomatic anemia 10/10/2017  . Gastrointestinal hemorrhage   . Chronic deep vein thrombosis (DVT) of left lower extremity ([place])   . Iron deficiency anemia 04/21/2017  . Essential hypertension 03/13/2017  . Recurrent deep vein thrombosis (DVT) ([place]) 03/11/2017  . Hyperlipidemia 11/04/2016  . Pressure ulcer 04/11/2016  . Pseudoaneurysm of left femoral artery ([place]) 04/11/2016  . PAD (peripheral artery disease) ([place]) 04/10/2016  . Atherosclerosis of native arteries of extremities with gangrene, left leg ([place]) 11/01/2015  . Ischemia of lower extremity 09/14/2015  . Atherosclerotic peripheral vascular disease with ulceration ([place]) 01/18/2015    Orientation  RESPIRATION BLADDER Height & Weight     Self, Time, Situation, Place  O2(nasal cannula 2L/min) Continent Weight: 231 lb 4.2 oz (104.9 kg) Height:  5\' 6"  (167.6 cm)  BEHAVIORAL SYMPTOMS/MOOD NEUROLOGICAL BOWEL NUTRITION STATUS      Incontinent Diet(see discharge summary)  AMBULATORY STATUS COMMUNICATION OF NEEDS Skin   Extensive Assist Verbally Other (Comment), PU Stage and Appropriate Care(pressure injury stage 3 sacrum)                       Personal Care Assistance Level of Assistance  Bathing, Feeding, Dressing, Total care Bathing Assistance: Limited assistance Feeding assistance: Limited assistance Dressing Assistance: Limited assistance Total Care Assistance: Maximum assistance   Functional Limitations Info  Sight, Hearing, Speech Sight Info: Adequate Hearing Info: Adequate Speech Info: Adequate    SPECIAL CARE FACTORS FREQUENCY  PT (By licensed PT), OT (By licensed OT)     PT Frequency: min 5x weekly OT Frequency: min 5x weekly            Contractures Contractures Info: Not present    Additional Factors Info  Code Status, Allergies, Isolation Precautions Code Status Info: full Allergies Info: No Known allergies     Isolation Precautions Info: ESBL, Emerging pathogen, air and contact precautions     Current Medications (01/04/2019):  This is the current hospital active medication list Current Facility-Administered Medications  Medication Dose Route Frequency Provider Last Rate Last Dose  . 0.9 %  sodium chloride infusion   Intravenous PRN Charlynne Cousins, MD   Stopped at 12/28/18 1825  . acetaminophen (TYLENOL) tablet 650 mg  650 mg Oral Q6H PRN Cherene Altes, MD   [phone]  mg at 01/03/19 1014  . apixaban (ELIQUIS) tablet 5 mg  5 mg Oral BID Charlynne Cousins, MD   5 mg at 01/04/19 1157  . aspirin EC tablet 81 mg  81 mg Oral Daily Kathie Dike, MD   81 mg at 01/04/19 0919  . atorvastatin (LIPITOR) tablet 10 mg  10 mg Oral Daily Charlynne Cousins, MD   10 mg at 01/04/19 2620  . bethanechol (URECHOLINE) tablet 10 mg  10 mg Oral TID Cherene Altes, MD   10 mg at 01/04/19 3559  . chlorhexidine (PERIDEX) 0.12 % solution 15 mL  15 mL Mouth Rinse BID Charlynne Cousins, MD   15 mL at 01/04/19 0920  . docusate sodium (COLACE) capsule 100 mg  100 mg Oral Daily PRN Charlynne Cousins, MD   100 mg at 12/30/18 0904  . feeding supplement (ENSURE ENLIVE) (ENSURE ENLIVE) liquid 237 mL  237 mL Oral TID BM Cherene Altes, MD   237 mL at 01/04/19 0935  . fluticasone furoate-vilanterol (BREO ELLIPTA) 100-25 MCG/INH 1 puff  1 puff Inhalation Daily Kathie Dike, MD   1 puff at 01/04/19 0816   And  . umeclidinium bromide (INCRUSE ELLIPTA) 62.5 MCG/INH 1 puff  1 puff Inhalation Daily Kathie Dike, MD   1 puff at 01/04/19 0816  . furosemide (LASIX) tablet 40 mg  40 mg Oral Daily Cherene Altes, MD   40 mg at 01/04/19 0920  . Gerhardt's butt cream   Topical BID Kathie Dike, MD      . loperamide (IMODIUM) capsule 2 mg  2 mg Oral PRN Cherene Altes, MD   2 mg at 12/31/18 1740  . MEDLINE mouth rinse  15 mL Mouth Rinse q12n4p Charlynne Cousins, MD   15 mL at 01/04/19 1012  . Melatonin TABS 6 mg  6 mg Oral QHS PRN Cherene Altes, MD      . multivitamin with minerals tablet 1 tablet  1 tablet Oral Daily Cherene Altes, MD   1 tablet at 01/04/19 [phone]  . ondansetron (ZOFRAN) tablet 4 mg  4 mg Oral Q6H PRN Charlynne Cousins, MD       Or  . ondansetron [facility]) injection 4 mg  4 mg Intravenous Q6H PRN Charlynne Cousins, MD      . polyethylene glycol [facility]) packet 17 g  17 g Oral Daily PRN Charlynne Cousins, MD   17 g at 12/30/18 0904  . tamsulosin (FLOMAX) capsule 0.4 mg  0.4 mg Oral QPC supper Kathie Dike, MD   0.4 mg at 01/03/19 1812  . vitamin C (ASCORBIC ACID) tablet 500 mg  500 mg Oral BID Cherene Altes, MD   500 mg at 01/04/19 1052  . zinc sulfate capsule 220 mg  220 mg Oral Daily  Cherene Altes, MD   220 mg at 01/04/19 3845     Discharge Medications: Please see discharge summary for a list of discharge medications.  Relevant Imaging Results:  Relevant Lab Results:   Additional Information SSN: [id number]  Alberteen Sam, LCSW

## 2019-01-04 NOTE — Progress Notes (Signed)
Pt continues to rest in bed without distress, he is alert and still refuses to get OOB. Drank a few sips of Ensure. No complaints or concerns voiced while in the room.

## 2019-01-04 NOTE — Progress Notes (Signed)
Pt assisted to chair, tol well. He denies discomfort or needing anything. Cal light in reach and he is aware to call if he needs anything.

## 2019-01-04 NOTE — Progress Notes (Signed)
Pt has been disconnected from monitor to be transported back to [facility], via [place], pt has his watch on and his undershirt in a personal belongings bag.

## 2019-01-05 DIAGNOSIS — M6281 Muscle weakness (generalized): Secondary | ICD-10-CM | POA: Diagnosis not present

## 2019-01-06 DIAGNOSIS — I7389 Other specified peripheral vascular diseases: Secondary | ICD-10-CM | POA: Diagnosis not present

## 2019-01-06 DIAGNOSIS — Z89612 Acquired absence of left leg above knee: Secondary | ICD-10-CM | POA: Diagnosis not present

## 2019-01-06 DIAGNOSIS — I5189 Other ill-defined heart diseases: Secondary | ICD-10-CM | POA: Diagnosis not present

## 2019-01-06 DIAGNOSIS — J984 Other disorders of lung: Secondary | ICD-10-CM | POA: Diagnosis not present

## 2019-01-06 DIAGNOSIS — M6281 Muscle weakness (generalized): Secondary | ICD-10-CM | POA: Diagnosis not present

## 2019-01-06 DIAGNOSIS — U071 COVID-19: Secondary | ICD-10-CM | POA: Diagnosis not present

## 2019-01-06 DIAGNOSIS — I82409 Acute embolism and thrombosis of unspecified deep veins of unspecified lower extremity: Secondary | ICD-10-CM | POA: Diagnosis not present

## 2019-01-07 ENCOUNTER — Inpatient Hospital Stay: Payer: Medicare Other | Admitting: Oncology

## 2019-01-07 ENCOUNTER — Other Ambulatory Visit: Payer: Medicaid Other

## 2019-01-07 DIAGNOSIS — M6281 Muscle weakness (generalized): Secondary | ICD-10-CM | POA: Diagnosis not present

## 2019-01-10 DIAGNOSIS — M6281 Muscle weakness (generalized): Secondary | ICD-10-CM | POA: Diagnosis not present

## 2019-01-11 DIAGNOSIS — M6281 Muscle weakness (generalized): Secondary | ICD-10-CM | POA: Diagnosis not present

## 2019-01-11 DIAGNOSIS — R600 Localized edema: Secondary | ICD-10-CM | POA: Diagnosis not present

## 2019-01-11 DIAGNOSIS — Z79899 Other long term (current) drug therapy: Secondary | ICD-10-CM | POA: Diagnosis not present

## 2019-01-11 DIAGNOSIS — G4700 Insomnia, unspecified: Secondary | ICD-10-CM | POA: Diagnosis not present

## 2019-01-11 DIAGNOSIS — L89322 Pressure ulcer of left buttock, stage 2: Secondary | ICD-10-CM | POA: Diagnosis not present

## 2019-01-11 DIAGNOSIS — D649 Anemia, unspecified: Secondary | ICD-10-CM | POA: Diagnosis not present

## 2019-01-11 DIAGNOSIS — U071 COVID-19: Secondary | ICD-10-CM | POA: Diagnosis not present

## 2019-01-12 DIAGNOSIS — M6281 Muscle weakness (generalized): Secondary | ICD-10-CM | POA: Diagnosis not present

## 2019-01-12 DIAGNOSIS — R2241 Localized swelling, mass and lump, right lower limb: Secondary | ICD-10-CM | POA: Diagnosis not present

## 2019-01-13 DIAGNOSIS — M6281 Muscle weakness (generalized): Secondary | ICD-10-CM | POA: Diagnosis not present

## 2019-01-14 ENCOUNTER — Other Ambulatory Visit: Payer: Self-pay

## 2019-01-14 DIAGNOSIS — M6281 Muscle weakness (generalized): Secondary | ICD-10-CM | POA: Diagnosis not present

## 2019-01-17 ENCOUNTER — Inpatient Hospital Stay: Payer: Medicare Other | Attending: Oncology | Admitting: Oncology

## 2019-01-17 ENCOUNTER — Encounter: Payer: Self-pay | Admitting: Oncology

## 2019-01-17 ENCOUNTER — Other Ambulatory Visit: Payer: Self-pay

## 2019-01-17 DIAGNOSIS — I82409 Acute embolism and thrombosis of unspecified deep veins of unspecified lower extremity: Secondary | ICD-10-CM | POA: Diagnosis not present

## 2019-01-17 DIAGNOSIS — R6520 Severe sepsis without septic shock: Secondary | ICD-10-CM

## 2019-01-17 DIAGNOSIS — M79605 Pain in left leg: Secondary | ICD-10-CM | POA: Diagnosis not present

## 2019-01-17 DIAGNOSIS — Z5181 Encounter for therapeutic drug level monitoring: Secondary | ICD-10-CM

## 2019-01-17 DIAGNOSIS — U071 COVID-19: Secondary | ICD-10-CM | POA: Diagnosis not present

## 2019-01-17 DIAGNOSIS — D500 Iron deficiency anemia secondary to blood loss (chronic): Secondary | ICD-10-CM

## 2019-01-17 DIAGNOSIS — M6281 Muscle weakness (generalized): Secondary | ICD-10-CM | POA: Diagnosis not present

## 2019-01-17 DIAGNOSIS — A419 Sepsis, unspecified organism: Secondary | ICD-10-CM | POA: Diagnosis not present

## 2019-01-17 DIAGNOSIS — Z7901 Long term (current) use of anticoagulants: Secondary | ICD-10-CM | POA: Diagnosis not present

## 2019-01-17 DIAGNOSIS — I739 Peripheral vascular disease, unspecified: Secondary | ICD-10-CM

## 2019-01-17 NOTE — Progress Notes (Signed)
HEMATOLOGY-ONCOLOGY TeleHEALTH VISIT PROGRESS NOTE  I connected with Timothy Juarez on 01/17/19 at  1:00 PM EDT by video enabled telemedicine visit and verified that I am speaking with the correct person using two identifiers. I discussed the limitations, risks, security and privacy concerns of performing an evaluation and management service by telemedicine and the availability of in-person appointments. I also discussed with the patient that there may be a patient responsible charge related to this service. The patient expressed understanding and agreed to proceed.   Other persons participating in the visit and their role in the encounter:  Janeann Merl, RN, check in patient.  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   INTERVAL HISTORY Timothy Juarez is a 68 y.o. male who has above history reviewed by me today presents for follow up visit for management of recurrent DVT, anticoagulation and iron deficiency anemia. Problems and complaints are listed below:  Patient lives in [facility] facility. Patient was recently admitted from 5 11-5/19 due to acute hypoxic respiratory due to pneumonia and COVID-19 infection. Today he reports feeling all right. He has some mild discomfort/tenderness of his chronic left lower extremity stump. He is on Eliquis 5 mg twice daily, confirmed with patient's RN. Denies any shortness of breath, or bleeding events.  Review of Systems  Constitutional: Positive for fatigue. Negative for appetite change, chills, fever and unexpected weight change.  HENT:   Negative for voice change.   Eyes: Negative for eye problems and icterus.  Respiratory: Negative for chest tightness, cough and shortness of breath.   Cardiovascular: Negative for chest pain and leg swelling.  Gastrointestinal: Negative for abdominal  distention and abdominal pain.  Endocrine: Negative for hot flashes.  Genitourinary: Negative for difficulty urinating, dysuria and frequency.   Musculoskeletal: Negative for arthralgias.  Skin: Negative for itching and rash.  Neurological: Negative for light-headedness and numbness.  Hematological: Negative for adenopathy. Does not bruise/bleed easily.  Psychiatric/Behavioral: Negative for confusion.    Past Medical History:  Diagnosis Date  . Acute embolism and thombos unsp deep vn unsp lower extremity ([place])   . Acute respiratory failure ([place])   . Allergy   . Anemia   . Aneurysm of unspecified site ([place])   . ARF (acute respiratory failure) ([place])    H/O  . Bronchitis   . CHF (congestive heart failure) ([place])   . COPD (chronic obstructive pulmonary disease) ([place])   . Cough   . Epistaxis   . GERD (gastroesophageal reflux disease)   . Gout   . Hyperlipidemia   . Hypertension   . Hypokalemia   . Insomnia   . Muscle contracture    MUSCLE SPASMS, muscle weakness  . Peripheral vascular disease ([place])   . Pneumonia   . Pressure ulcer    Past Surgical History:  Procedure Laterality Date  . AMPUTATION Left 09/19/2015   Procedure: AMPUTATION BELOW KNEE;  Surgeon: Algernon Huxley, MD;  Location: [facility] ORS;  Service: Vascular;  Laterality: Left;  . AMPUTATION Left 11/01/2015   Procedure: AMPUTATION ABOVE KNEE;  Surgeon: Algernon Huxley, MD;  Location: [facility] ORS;  Service: Vascular;  Laterality: Left;  . APPLICATION OF WOUND VAC Left 10/18/2015   Procedure: APPLICATION OF WOUND VAC;  Surgeon: Algernon Huxley, MD;  Location: [facility]S;  Service: Vascular;  Laterality: Left;  . COLONOSCOPY WITH PROPOFOL N/A 05/25/2017   Procedure:  COLONOSCOPY WITH PROPOFOL;  Surgeon: Jonathon Bellows, MD;  Location: [facility] ENDOSCOPY;  Service: Gastroenterology;  Laterality: N/A;  . COLONOSCOPY WITH PROPOFOL N/A 10/14/2017   Procedure: COLONOSCOPY WITH PROPOFOL;  Surgeon: Jonathon Bellows, MD;  Location: [facility] ENDOSCOPY;  Service:  Gastroenterology;  Laterality: N/A;  . ESOPHAGOGASTRODUODENOSCOPY (EGD) WITH PROPOFOL N/A 05/25/2017   Procedure: ESOPHAGOGASTRODUODENOSCOPY (EGD) WITH PROPOFOL;  Surgeon: Jonathon Bellows, MD;  Location: [facility] ENDOSCOPY;  Service: Gastroenterology;  Laterality: N/A;  . ESOPHAGOGASTRODUODENOSCOPY (EGD) WITH PROPOFOL N/A 10/14/2017   Procedure: ESOPHAGOGASTRODUODENOSCOPY (EGD) WITH PROPOFOL;  Surgeon: Jonathon Bellows, MD;  Location: [facility] ENDOSCOPY;  Service: Gastroenterology;  Laterality: N/A;  . GIVENS CAPSULE STUDY N/A 07/14/2017   Procedure: GIVENS CAPSULE STUDY;  Surgeon: Jonathon Bellows, MD;  Location: [facility] ENDOSCOPY;  Service: Gastroenterology;  Laterality: N/A;  . GIVENS CAPSULE STUDY N/A 10/30/2017   Procedure: GIVENS CAPSULE STUDY 12 HR;  Surgeon: Jonathon Bellows, MD;  Location: [facility] ENDOSCOPY;  Service: Gastroenterology;  Laterality: N/A;  . IVC FILTER INSERTION N/A 10/12/2017   Procedure: IVC FILTER INSERTION;  Surgeon: Algernon Huxley, MD;  Location: [place] CV LAB;  Service: Cardiovascular;  Laterality: N/A;  . LOWER EXTREMITY ANGIOGRAPHY Right 10/04/2018   Procedure: LOWER EXTREMITY ANGIOGRAPHY;  Surgeon: Algernon Huxley, MD;  Location: [place] CV LAB;  Service: Cardiovascular;  Laterality: Right;  . PERIPHERAL VASCULAR CATHETERIZATION Left 01/18/2015   Procedure: Lower Extremity Angiography;  Surgeon: Algernon Huxley, MD;  Location: [place] CV LAB;  Service: Cardiovascular;  Laterality: Left;  . PERIPHERAL VASCULAR CATHETERIZATION N/A 01/18/2015   Procedure: Lower Extremity Intervention;  Surgeon: Algernon Huxley, MD;  Location: [place] CV LAB;  Service: Cardiovascular;  Laterality: N/A;  . PERIPHERAL VASCULAR CATHETERIZATION  07/30/2015   Procedure: Lower Extremity Intervention;  Surgeon: Algernon Huxley, MD;  Location: [place] CV LAB;  Service: Cardiovascular;;  . PERIPHERAL VASCULAR CATHETERIZATION N/A 07/30/2015   Procedure: Abdominal Aortogram w/Lower Extremity;  Surgeon: Algernon Huxley, MD;   Location: [place] CV LAB;  Service: Cardiovascular;  Laterality: N/A;  . PERIPHERAL VASCULAR CATHETERIZATION Left 08/22/2015   Procedure: Lower Extremity Angiography;  Surgeon: Algernon Huxley, MD;  Location: [place] CV LAB;  Service: Cardiovascular;  Laterality: Left;  . PERIPHERAL VASCULAR CATHETERIZATION Left 08/22/2015   Procedure: Lower Extremity Intervention;  Surgeon: Algernon Huxley, MD;  Location: [place] CV LAB;  Service: Cardiovascular;  Laterality: Left;  . PERIPHERAL VASCULAR CATHETERIZATION Right 03/31/2016   Procedure: Lower Extremity Angiography;  Surgeon: Algernon Huxley, MD;  Location: [place] CV LAB;  Service: Cardiovascular;  Laterality: Right;  . PERIPHERAL VASCULAR CATHETERIZATION  03/31/2016   Procedure: Lower Extremity Intervention;  Surgeon: Algernon Huxley, MD;  Location: [place] CV LAB;  Service: Cardiovascular;;  . PERIPHERAL VASCULAR CATHETERIZATION Left 04/10/2016   Procedure: Lower Extremity Angiography;  Surgeon: Algernon Huxley, MD;  Location: [place] CV LAB;  Service: Cardiovascular;  Laterality: Left;  . WOUND DEBRIDEMENT Left 10/18/2015   Procedure: DEBRIDEMENT WOUND   ( LEFT BKA DEBRIDEMENT );  Surgeon: Algernon Huxley, MD;  Location: [facility] ORS;  Service: Vascular;  Laterality: Left;    Family History  Problem Relation Age of Onset  . Heart attack Mother   . Varicose Veins Neg Hx     Social History   Socioeconomic History  . Marital status: Legally Separated    Spouse name: Not on file  . Number of children: Not on file  . Years of education: Not on file  .  Highest education level: Not on file  Occupational History  . Not on file  Social Needs  . Financial resource strain: Not on file  . Food insecurity:    Worry: Not on file    Inability: Not on file  . Transportation needs:    Medical: Not on file    Non-medical: Not on file  Tobacco Use  . Smoking status: Former Smoker    Packs/day: 1.00    Years: 44.00    Pack years: 44.00    Types:  Cigarettes    Last attempt to quit: 11/17/2012    Years since quitting: 6.1  . Smokeless tobacco: Never Used  Substance and Sexual Activity  . Alcohol use: No  . Drug use: No  . Sexual activity: Never  Lifestyle  . Physical activity:    Days per week: Not on file    Minutes per session: Not on file  . Stress: Not on file  Relationships  . Social connections:    Talks on phone: Not on file    Gets together: Not on file    Attends religious service: Not on file    Active member of club or organization: Not on file    Attends meetings of clubs or organizations: Not on file    Relationship status: Not on file  . Intimate partner violence:    Fear of current or ex partner: Not on file    Emotionally abused: Not on file    Physically abused: Not on file    Forced sexual activity: Not on file  Other Topics Concern  . Not on file  Social History Narrative  . Not on file    Current Outpatient Medications on File Prior to Visit  Medication Sig Dispense Refill  . acetaminophen (TYLENOL) 325 MG tablet Take 650 mg by mouth every 4 (four) hours as needed for fever.     [name] aspirin EC 81 MG tablet Take 1 tablet (81 mg total) by mouth daily. 150 tablet 2  . atorvastatin (LIPITOR) 10 MG tablet Take 1 tablet (10 mg total) by mouth daily. 30 tablet 11  . baclofen (LIORESAL) 10 MG tablet Take 10 mg by mouth 3 (three) times daily. Hold for sedation    . docusate sodium (COLACE) 100 MG capsule Take 100 mg by mouth daily as needed for mild constipation.    [name] ELIQUIS 5 MG TABS tablet Take 5 mg by mouth 2 (two) times daily.    . fentaNYL (DURAGESIC) 12 MCG/HR Place 1 patch onto the skin every 3 (three) days. 1 patch 0  . ferrous sulfate 324 (65 Fe) MG TBEC Take 324 mg by mouth daily.    . furosemide (LASIX) 40 MG tablet Take 1 tablet (40 mg total) by mouth daily. 30 tablet   . gabapentin (NEURONTIN) 100 MG capsule Take 100 mg by mouth at bedtime.    [name] guaiFENesin (ROBITUSSIN) 100 MG/5ML SOLN Take 15  mLs by mouth 3 (three) times daily as needed for cough or to loosen phlegm.    . loperamide (IMODIUM) 2 MG capsule Take 2 mg by mouth 3 (three) times daily as needed for diarrhea or loose stools.    . Melatonin 5 MG TABS Take 5 mg by mouth at bedtime as needed (insomnia).     . nitroGLYCERIN (NITROSTAT) 0.4 MG SL tablet Place 0.14 mg under the tongue every 5 (five) minutes as needed for chest pain.    . Omega-3 Fatty Acids (FISH OIL) 1000 MG  CAPS Take 1,000 mg by mouth every morning.    . Oxycodone HCl 10 MG TABS Take 1 tablet (10 mg total) by mouth 3 (three) times daily as needed (moderate pain). 9 tablet 0  . oxymetazoline (AFRIN) 0.05 % nasal spray Place 2 sprays into both nostrils 2 (two) times daily as needed for congestion. For nose bleeds    . potassium chloride SA (K-DUR,KLOR-CON) 20 MEQ tablet Take 20 mEq by mouth daily. With or after meal    . sodium chloride (OCEAN) 0.65 % SOLN nasal spray Place 2 sprays into both nostrils 2 (two) times daily as needed for congestion.    . TRELEGY ELLIPTA 100-62.5-25 MCG/INH AEPB Inhale 1 puff into the lungs daily.     [name] LORazepam (ATIVAN) 0.5 MG tablet Take 1 tablet (0.5 mg total) by mouth at bedtime. Also may give one tablet as needed for anxiety/insomnia (Patient not taking: Reported on 01/17/2019) 3 tablet 0   No current facility-administered medications on file prior to visit.     No Known Allergies     Observations/Objective: There were no vitals filed for this visit. There is no height or weight on file to calculate BMI.  Physical Exam  Constitutional: No distress.  HENT:  Head: Normocephalic and atraumatic.  Neurological: He is alert.  Psychiatric: Affect normal.    CBC    Component Value Date/Time   WBC 9.7 01/03/2019 0443   RBC 5.44 01/03/2019 0443   HGB 14.4 01/03/2019 0443   HGB 14.3 12/01/2013 1317   HCT 45.8 01/03/2019 0443   HCT 43.9 12/01/2013 1317   PLT 416 (H) 01/03/2019 0443   PLT 250 12/01/2013 1317   MCV 84.2  01/03/2019 0443   MCV 84 12/01/2013 1317   MCH 26.5 01/03/2019 0443   MCHC 31.4 01/03/2019 0443   RDW 17.7 (H) 01/03/2019 0443   RDW 13.9 12/01/2013 1317   LYMPHSABS 2.5 01/03/2019 0443   LYMPHSABS 3.2 04/03/2013 0529   MONOABS 0.7 01/03/2019 0443   MONOABS 1.3 (H) 04/03/2013 0529   EOSABS 0.3 01/03/2019 0443   EOSABS 0.2 04/03/2013 0529   BASOSABS 0.0 01/03/2019 0443   BASOSABS 0.1 04/03/2013 0529    CMP     Component Value Date/Time   NA 142 01/04/2019 0515   NA 136 12/01/2013 1317   K 3.8 01/04/2019 0515   K 3.9 12/01/2013 1317   CL 100 01/04/2019 0515   CL 100 12/01/2013 1317   CO2 33 (H) 01/04/2019 0515   CO2 33 (H) 12/01/2013 1317   GLUCOSE 93 01/04/2019 0515   GLUCOSE 91 12/01/2013 1317   BUN 24 (H) 01/04/2019 0515   BUN 24 (H) 09/25/2014 0846   CREATININE 1.10 01/04/2019 0515   CREATININE 1.29 09/25/2014 0846   CALCIUM 8.1 (L) 01/04/2019 0515   CALCIUM 9.4 12/01/2013 1317   PROT 5.8 (L) 01/02/2019 0137   PROT 6.5 03/25/2013 0351   ALBUMIN 2.5 (L) 01/02/2019 0137   ALBUMIN 1.2 (L) 03/25/2013 0351   AST 25 01/02/2019 0137   AST 45 (H) 03/25/2013 0351   ALT 28 01/02/2019 0137   ALT 35 03/25/2013 0351   ALKPHOS 54 01/02/2019 0137   ALKPHOS 71 03/25/2013 0351   BILITOT 0.7 01/02/2019 0137   BILITOT 0.3 03/25/2013 0351   GFRNONAA >60 01/04/2019 0515   GFRNONAA 60 (L) 09/25/2014 0846   GFRNONAA >60 03/16/2014 0750   GFRAA >60 01/04/2019 0515   GFRAA >60 09/25/2014 0846   GFRAA >60 03/16/2014 0750  Assessment and Plan: 1. Anticoagulation management encounter   2. Iron deficiency anemia due to chronic blood loss   3. Recurrent deep vein thrombosis (DVT) (HCC)   4. Severe sepsis (HCC) Chronic  5. Peripheral vascular disease (HCC)     Recurrent DVT, on chronic anticoagulation with Eliquis 5 mg twice daily.   He tolerates well.  Discussed with patient that I would recommend to continue Eliquis 5 mg twice daily for now. He can follow-up in 3 months and see  me in the clinic in person.  May reduce to 2.5 mg Eliquis at that time.  History of iron deficiency anemia due to chronic blood loss, .  I reviewed patient's recent blood work during admission. Hemoglobin has been stable.  Continue monitoring.  Peripheral vascular disease, on Aspirin 81mg . I communicated with vascular surgeon Dr.Dew and he recommends Aspirin 81mg  in additional to Elqiuis 5mg  BID.  Will monitor his cbc counts.   Follow Up Instructions: Lab MD assessment 3 months.   I discussed the assessment and treatment plan with the patient. The patient was provided an opportunity to ask questions and all were answered. The patient agreed with the plan and demonstrated an understanding of the instructions.  The patient was advised to call back or seek an in-person evaluation if the symptoms worsen or if the condition Juarez to improve as anticipated.   Earlie Server, MD 01/17/2019 4:32 PM

## 2019-01-17 NOTE — Progress Notes (Signed)
Contacted [facility] for patient telehealth visit. Spoke to [employer], Nurse, who went over patients chart and medication reconciliation. She states patients was positive for COVID-19, but has been cleared today.

## 2019-01-18 DIAGNOSIS — M6281 Muscle weakness (generalized): Secondary | ICD-10-CM | POA: Diagnosis not present

## 2019-01-19 DIAGNOSIS — M6281 Muscle weakness (generalized): Secondary | ICD-10-CM | POA: Diagnosis not present

## 2019-01-20 DIAGNOSIS — M6281 Muscle weakness (generalized): Secondary | ICD-10-CM | POA: Diagnosis not present

## 2019-01-21 DIAGNOSIS — M6281 Muscle weakness (generalized): Secondary | ICD-10-CM | POA: Diagnosis not present

## 2019-01-24 DIAGNOSIS — M6281 Muscle weakness (generalized): Secondary | ICD-10-CM | POA: Diagnosis not present

## 2019-01-25 DIAGNOSIS — M6281 Muscle weakness (generalized): Secondary | ICD-10-CM | POA: Diagnosis not present

## 2019-01-26 DIAGNOSIS — M6281 Muscle weakness (generalized): Secondary | ICD-10-CM | POA: Diagnosis not present

## 2019-01-27 DIAGNOSIS — M6281 Muscle weakness (generalized): Secondary | ICD-10-CM | POA: Diagnosis not present

## 2019-01-28 ENCOUNTER — Telehealth (INDEPENDENT_AMBULATORY_CARE_PROVIDER_SITE_OTHER): Payer: Self-pay | Admitting: Nurse Practitioner

## 2019-01-28 DIAGNOSIS — M6281 Muscle weakness (generalized): Secondary | ICD-10-CM | POA: Diagnosis not present

## 2019-01-28 NOTE — Telephone Encounter (Signed)
That is fine but in order to do so we will need to be sure to have results before appointment as well as a nurse that can perform a physical assessment as requested.  If this can be done we can do a phone visit.

## 2019-01-28 NOTE — Telephone Encounter (Signed)
Call was return to Timothy Juarez to inform her with Eulogio Ditch NP medical advice but she was gone for the day and no voicemail available to leave message per receptionist but I will call back on Monday

## 2019-01-30 DIAGNOSIS — M6281 Muscle weakness (generalized): Secondary | ICD-10-CM | POA: Diagnosis not present

## 2019-01-31 ENCOUNTER — Telehealth (INDEPENDENT_AMBULATORY_CARE_PROVIDER_SITE_OTHER): Payer: Self-pay | Admitting: Nurse Practitioner

## 2019-01-31 DIAGNOSIS — I739 Peripheral vascular disease, unspecified: Secondary | ICD-10-CM | POA: Diagnosis not present

## 2019-01-31 NOTE — Telephone Encounter (Signed)
I spoke with Sherren Mocha this morning and this question has been address

## 2019-01-31 NOTE — Telephone Encounter (Signed)
I spoke with Sherren Mocha from [facility] and he has been inform with Timothy Ditch NP medical advice and he stated he will put a order in for the ABI to be done in the facility and have results fax over to the office.

## 2019-02-01 DIAGNOSIS — I10 Essential (primary) hypertension: Secondary | ICD-10-CM | POA: Diagnosis not present

## 2019-02-01 DIAGNOSIS — I7389 Other specified peripheral vascular diseases: Secondary | ICD-10-CM | POA: Diagnosis not present

## 2019-02-01 DIAGNOSIS — I5189 Other ill-defined heart diseases: Secondary | ICD-10-CM | POA: Diagnosis not present

## 2019-02-01 DIAGNOSIS — M6281 Muscle weakness (generalized): Secondary | ICD-10-CM | POA: Diagnosis not present

## 2019-02-01 DIAGNOSIS — J984 Other disorders of lung: Secondary | ICD-10-CM | POA: Diagnosis not present

## 2019-02-01 DIAGNOSIS — U071 COVID-19: Secondary | ICD-10-CM | POA: Diagnosis not present

## 2019-02-02 DIAGNOSIS — M6281 Muscle weakness (generalized): Secondary | ICD-10-CM | POA: Diagnosis not present

## 2019-02-03 DIAGNOSIS — M6281 Muscle weakness (generalized): Secondary | ICD-10-CM | POA: Diagnosis not present

## 2019-02-04 ENCOUNTER — Encounter (INDEPENDENT_AMBULATORY_CARE_PROVIDER_SITE_OTHER): Payer: Medicare Other

## 2019-02-04 ENCOUNTER — Ambulatory Visit (INDEPENDENT_AMBULATORY_CARE_PROVIDER_SITE_OTHER): Payer: Medicare Other | Admitting: Nurse Practitioner

## 2019-02-04 DIAGNOSIS — Z89612 Acquired absence of left leg above knee: Secondary | ICD-10-CM | POA: Diagnosis not present

## 2019-02-04 DIAGNOSIS — E785 Hyperlipidemia, unspecified: Secondary | ICD-10-CM

## 2019-02-04 DIAGNOSIS — I739 Peripheral vascular disease, unspecified: Secondary | ICD-10-CM

## 2019-02-04 DIAGNOSIS — M6281 Muscle weakness (generalized): Secondary | ICD-10-CM | POA: Diagnosis not present

## 2019-02-07 DIAGNOSIS — M6281 Muscle weakness (generalized): Secondary | ICD-10-CM | POA: Diagnosis not present

## 2019-02-08 DIAGNOSIS — M6281 Muscle weakness (generalized): Secondary | ICD-10-CM | POA: Diagnosis not present

## 2019-02-09 DIAGNOSIS — M6281 Muscle weakness (generalized): Secondary | ICD-10-CM | POA: Diagnosis not present

## 2019-02-10 DIAGNOSIS — M6281 Muscle weakness (generalized): Secondary | ICD-10-CM | POA: Diagnosis not present

## 2019-02-11 DIAGNOSIS — M6281 Muscle weakness (generalized): Secondary | ICD-10-CM | POA: Diagnosis not present

## 2019-02-13 ENCOUNTER — Encounter (INDEPENDENT_AMBULATORY_CARE_PROVIDER_SITE_OTHER): Payer: Self-pay | Admitting: Nurse Practitioner

## 2019-02-13 NOTE — Progress Notes (Signed)
SUBJECTIVE:  Patient ID: Timothy Juarez, male    DOB: 1950/11/29, 68 y.o.   MRN: [id number] No chief complaint on file.   HPI  Timothy Juarez is a 68 y.o. male The patient returns to the office for followup and review of the noninvasive studies. There have been no interval changes in lower extremity symptoms. No interval shortening of the patient's claudication distance or development of rest pain symptoms. No new ulcers or wounds have occurred since the last visit.  There have been no significant changes to the patient's overall health care.  Patient does complain of swelling in his left stump.   The patient denies amaurosis fugax or recent TIA symptoms. There are no recent neurological changes noted. The patient denies history of DVT, PE or superficial thrombophlebitis. The patient denies recent episodes of angina or shortness of breath.   ABI Rt=0.80 and Lt=n/a  (previous ABI's Rt=0.70 and Lt=n/a) These results were provided by the patient's nursing facility   Past Medical History:  Diagnosis Date  . Acute embolism and thombos unsp deep vn unsp lower extremity ([place])   . Acute respiratory failure ([place])   . Allergy   . Anemia   . Aneurysm of unspecified site ([place])   . ARF (acute respiratory failure) ([place])    H/O  . Bronchitis   . CHF (congestive heart failure) ([place])   . COPD (chronic obstructive pulmonary disease) ([place])   . Cough   . Epistaxis   . GERD (gastroesophageal reflux disease)   . Gout   . Hyperlipidemia   . Hypertension   . Hypokalemia   . Insomnia   . Muscle contracture    MUSCLE SPASMS, muscle weakness  . Peripheral vascular disease ([place])   . Pneumonia   . Pressure ulcer     Past Surgical History:  Procedure Laterality Date  . AMPUTATION Left 09/19/2015   Procedure: AMPUTATION BELOW KNEE;  Surgeon: Algernon Huxley, MD;  Location: [facility] ORS;  Service: Vascular;  Laterality: Left;  . AMPUTATION Left 11/01/2015   Procedure: AMPUTATION ABOVE KNEE;   Surgeon: Algernon Huxley, MD;  Location: [facility] ORS;  Service: Vascular;  Laterality: Left;  . APPLICATION OF WOUND VAC Left 10/18/2015   Procedure: APPLICATION OF WOUND VAC;  Surgeon: Algernon Huxley, MD;  Location: [facility] ORS;  Service: Vascular;  Laterality: Left;  . COLONOSCOPY WITH PROPOFOL N/A 05/25/2017   Procedure: COLONOSCOPY WITH PROPOFOL;  Surgeon: Jonathon Bellows, MD;  Location: [facility] ENDOSCOPY;  Service: Gastroenterology;  Laterality: N/A;  . COLONOSCOPY WITH PROPOFOL N/A 10/14/2017   Procedure: COLONOSCOPY WITH PROPOFOL;  Surgeon: Jonathon Bellows, MD;  Location: [facility] ENDOSCOPY;  Service: Gastroenterology;  Laterality: N/A;  . ESOPHAGOGASTRODUODENOSCOPY (EGD) WITH PROPOFOL N/A 05/25/2017   Procedure: ESOPHAGOGASTRODUODENOSCOPY (EGD) WITH PROPOFOL;  Surgeon: Jonathon Bellows, MD;  Location: [facility] ENDOSCOPY;  Service: Gastroenterology;  Laterality: N/A;  . ESOPHAGOGASTRODUODENOSCOPY (EGD) WITH PROPOFOL N/A 10/14/2017   Procedure: ESOPHAGOGASTRODUODENOSCOPY (EGD) WITH PROPOFOL;  Surgeon: Jonathon Bellows, MD;  Location: [facility] ENDOSCOPY;  Service: Gastroenterology;  Laterality: N/A;  . GIVENS CAPSULE STUDY N/A 07/14/2017   Procedure: GIVENS CAPSULE STUDY;  Surgeon: Jonathon Bellows, MD;  Location: [facility] ENDOSCOPY;  Service: Gastroenterology;  Laterality: N/A;  . GIVENS CAPSULE STUDY N/A 10/30/2017   Procedure: GIVENS CAPSULE STUDY 12 HR;  Surgeon: Jonathon Bellows, MD;  Location: [facility] ENDOSCOPY;  Service: Gastroenterology;  Laterality: N/A;  . IVC FILTER INSERTION N/A 10/12/2017   Procedure: IVC FILTER INSERTION;  Surgeon: Algernon Huxley, MD;  Location: [facility]  INVASIVE CV LAB;  Service: Cardiovascular;  Laterality: N/A;  . LOWER EXTREMITY ANGIOGRAPHY Right 10/04/2018   Procedure: LOWER EXTREMITY ANGIOGRAPHY;  Surgeon: Algernon Huxley, MD;  Location: [place] CV LAB;  Service: Cardiovascular;  Laterality: Right;  . PERIPHERAL VASCULAR CATHETERIZATION Left 01/18/2015   Procedure: Lower Extremity Angiography;  Surgeon: Algernon Huxley, MD;  Location:  [place] CV LAB;  Service: Cardiovascular;  Laterality: Left;  . PERIPHERAL VASCULAR CATHETERIZATION N/A 01/18/2015   Procedure: Lower Extremity Intervention;  Surgeon: Algernon Huxley, MD;  Location: [place] CV LAB;  Service: Cardiovascular;  Laterality: N/A;  . PERIPHERAL VASCULAR CATHETERIZATION  07/30/2015   Procedure: Lower Extremity Intervention;  Surgeon: Algernon Huxley, MD;  Location: [place] CV LAB;  Service: Cardiovascular;;  . PERIPHERAL VASCULAR CATHETERIZATION N/A 07/30/2015   Procedure: Abdominal Aortogram w/Lower Extremity;  Surgeon: Algernon Huxley, MD;  Location: [place] CV LAB;  Service: Cardiovascular;  Laterality: N/A;  . PERIPHERAL VASCULAR CATHETERIZATION Left 08/22/2015   Procedure: Lower Extremity Angiography;  Surgeon: Algernon Huxley, MD;  Location: [place] CV LAB;  Service: Cardiovascular;  Laterality: Left;  . PERIPHERAL VASCULAR CATHETERIZATION Left 08/22/2015   Procedure: Lower Extremity Intervention;  Surgeon: Algernon Huxley, MD;  Location: [place] CV LAB;  Service: Cardiovascular;  Laterality: Left;  . PERIPHERAL VASCULAR CATHETERIZATION Right 03/31/2016   Procedure: Lower Extremity Angiography;  Surgeon: Algernon Huxley, MD;  Location: [place] CV LAB;  Service: Cardiovascular;  Laterality: Right;  . PERIPHERAL VASCULAR CATHETERIZATION  03/31/2016   Procedure: Lower Extremity Intervention;  Surgeon: Algernon Huxley, MD;  Location: [place] CV LAB;  Service: Cardiovascular;;  . PERIPHERAL VASCULAR CATHETERIZATION Left 04/10/2016   Procedure: Lower Extremity Angiography;  Surgeon: Algernon Huxley, MD;  Location: [place] CV LAB;  Service: Cardiovascular;  Laterality: Left;  . WOUND DEBRIDEMENT Left 10/18/2015   Procedure: DEBRIDEMENT WOUND   ( LEFT BKA DEBRIDEMENT );  Surgeon: Algernon Huxley, MD;  Location: [facility] ORS;  Service: Vascular;  Laterality: Left;    Social History   Socioeconomic History  . Marital status: Legally Separated    Spouse name: Not on  file  . Number of children: Not on file  . Years of education: Not on file  . Highest education level: Not on file  Occupational History  . Not on file  Social Needs  . Financial resource strain: Not on file  . Food insecurity    Worry: Not on file    Inability: Not on file  . Transportation needs    Medical: Not on file    Non-medical: Not on file  Tobacco Use  . Smoking status: Former Smoker    Packs/day: 1.00    Years: 44.00    Pack years: 44.00    Types: Cigarettes    Quit date: 11/17/2012    Years since quitting: 6.2  . Smokeless tobacco: Never Used  Substance and Sexual Activity  . Alcohol use: No  . Drug use: No  . Sexual activity: Never  Lifestyle  . Physical activity    Days per week: Not on file    Minutes per session: Not on file  . Stress: Not on file  Relationships  . Social Herbalist on phone: Not on file    Gets together: Not on file    Attends religious service: Not on file    Active member of club or organization: Not on file    Attends meetings  of clubs or organizations: Not on file    Relationship status: Not on file  . Intimate partner violence    Fear of current or ex partner: Not on file    Emotionally abused: Not on file    Physically abused: Not on file    Forced sexual activity: Not on file  Other Topics Concern  . Not on file  Social History Narrative  . Not on file    Family History  Problem Relation Age of Onset  . Heart attack Mother   . Varicose Veins Neg Hx     No Known Allergies   Review of Systems   Review of Systems: Negative Unless Checked Constitutional: [] Weight loss  [] Fever  [] Chills Cardiac: [] Chest pain   []  Atrial Fibrillation  [] Palpitations   [] Shortness of breath when laying flat   [] Shortness of breath with exertion. [] Shortness of breath at rest Vascular:  [] Pain in legs with walking   [] Pain in legs with standing [] Pain in legs when laying flat   [] Claudication    [] Pain in feet when laying flat     [] History of DVT   [] Phlebitis   [x] Swelling in legs   [] Varicose veins   [] Non-healing ulcers Pulmonary:   [] Uses home oxygen   [] Productive cough   [] Hemoptysis   [] Wheeze  [] COPD   [] Asthma Neurologic:  [] Dizziness   [] Seizures  [] Blackouts [] History of stroke   [] History of TIA  [] Aphasia   [] Temporary Blindness   [] Weakness or numbness in arm   [x] Weakness or numbness in leg Musculoskeletal:   [] Joint swelling   [] Joint pain   [] Low back pain  []  History of Knee Replacement [] Arthritis [] back Surgeries  []  Spinal Stenosis    Hematologic:  [] Easy bruising  [] Easy bleeding   [] Hypercoagulable state   [] Anemic Gastrointestinal:  [] Diarrhea   [] Vomiting  [] Gastroesophageal reflux/heartburn   [] Difficulty swallowing. [] Abdominal pain Genitourinary:  [] Chronic kidney disease   [] Difficult urination  [] Anuric   [] Blood in urine [] Frequent urination  [] Burning with urination   [] Hematuria Skin:  [] Rashes   [] Ulcers [] Wounds Psychological:  [] History of anxiety   []  History of major depression  []  Memory Difficulties      OBJECTIVE:   Physical Exam  There were no vitals taken for this visit.  Gen: WD/WN, NAD  Psychiatric: Judgment intact, Mood & affect appropriate for pt's clinical situation.  ASSESSMENT AND PLAN:  1. PAD (peripheral artery disease) (HCC)  Recommend:  The patient has evidence of atherosclerosis of the lower extremities with claudication.  The patient does not voice lifestyle limiting changes at this point in time.  Noninvasive studies do not suggest clinically significant change.  No invasive studies, angiography or surgery at this time The patient should continue walking and begin a more formal exercise program.  The patient should continue antiplatelet therapy and aggressive treatment of the lipid abnormalities  No changes in the patient's medications at this time  The patient should continue wearing graduated compression socks 10-15 mmHg strength to control the  mild edema.    2. Hx of AKA (above knee amputation), left ([place]) Gave nursing facility instructionsto snugly wrap stump with ace bandages and to elevate as much as possible to see if this helps with swelling.  Will evaluate at next in office visit.    3. Hyperlipidemia, unspecified hyperlipidemia type Continue statin as ordered and reviewed, no changes at this time    Current Outpatient Medications on File Prior to Visit  Medication Sig Dispense  Refill  . acetaminophen (TYLENOL) 325 MG tablet Take 650 mg by mouth every 4 (four) hours as needed for fever.     [name] aspirin EC 81 MG tablet Take 1 tablet (81 mg total) by mouth daily. 150 tablet 2  . atorvastatin (LIPITOR) 10 MG tablet Take 1 tablet (10 mg total) by mouth daily. 30 tablet 11  . baclofen (LIORESAL) 10 MG tablet Take 10 mg by mouth 3 (three) times daily. Hold for sedation    . docusate sodium (COLACE) 100 MG capsule Take 100 mg by mouth daily as needed for mild constipation.    [name] ELIQUIS 5 MG TABS tablet Take 5 mg by mouth 2 (two) times daily.    . fentaNYL (DURAGESIC) 12 MCG/HR Place 1 patch onto the skin every 3 (three) days. 1 patch 0  . ferrous sulfate 324 (65 Fe) MG TBEC Take 324 mg by mouth daily.    . furosemide (LASIX) 40 MG tablet Take 1 tablet (40 mg total) by mouth daily. 30 tablet   . gabapentin (NEURONTIN) 100 MG capsule Take 100 mg by mouth at bedtime.    [name] guaiFENesin (ROBITUSSIN) 100 MG/5ML SOLN Take 15 mLs by mouth 3 (three) times daily as needed for cough or to loosen phlegm.    . loperamide (IMODIUM) 2 MG capsule Take 2 mg by mouth 3 (three) times daily as needed for diarrhea or loose stools.    [name] LORazepam (ATIVAN) 0.5 MG tablet Take 1 tablet (0.5 mg total) by mouth at bedtime. Also may give one tablet as needed for anxiety/insomnia (Patient not taking: Reported on 01/17/2019) 3 tablet 0  . Melatonin 5 MG TABS Take 5 mg by mouth at bedtime as needed (insomnia).     . nitroGLYCERIN (NITROSTAT) 0.4 MG SL tablet Place  0.14 mg under the tongue every 5 (five) minutes as needed for chest pain.    . Omega-3 Fatty Acids (FISH OIL) 1000 MG CAPS Take 1,000 mg by mouth every morning.    . Oxycodone HCl 10 MG TABS Take 1 tablet (10 mg total) by mouth 3 (three) times daily as needed (moderate pain). 9 tablet 0  . oxymetazoline (AFRIN) 0.05 % nasal spray Place 2 sprays into both nostrils 2 (two) times daily as needed for congestion. For nose bleeds    . potassium chloride SA (K-DUR,KLOR-CON) 20 MEQ tablet Take 20 mEq by mouth daily. With or after meal    . sodium chloride (OCEAN) 0.65 % SOLN nasal spray Place 2 sprays into both nostrils 2 (two) times daily as needed for congestion.    . TRELEGY ELLIPTA 100-62.5-25 MCG/INH AEPB Inhale 1 puff into the lungs daily.      No current facility-administered medications on file prior to visit.     There are no Patient Instructions on file for this visit. No follow-ups on file.   Kris Hartmann, NP  This note was completed with Sales executive.  Any errors are purely unintentional.

## 2019-02-14 DIAGNOSIS — M6281 Muscle weakness (generalized): Secondary | ICD-10-CM | POA: Diagnosis not present

## 2019-02-15 DIAGNOSIS — M6281 Muscle weakness (generalized): Secondary | ICD-10-CM | POA: Diagnosis not present

## 2019-02-16 ENCOUNTER — Telehealth (INDEPENDENT_AMBULATORY_CARE_PROVIDER_SITE_OTHER): Payer: Self-pay

## 2019-02-16 DIAGNOSIS — M6281 Muscle weakness (generalized): Secondary | ICD-10-CM | POA: Diagnosis not present

## 2019-02-17 DIAGNOSIS — I739 Peripheral vascular disease, unspecified: Secondary | ICD-10-CM | POA: Diagnosis not present

## 2019-02-17 DIAGNOSIS — M6281 Muscle weakness (generalized): Secondary | ICD-10-CM | POA: Diagnosis not present

## 2019-02-17 NOTE — Telephone Encounter (Signed)
New prescription for shrinker has been faxed to [facility]

## 2019-02-18 DIAGNOSIS — M6281 Muscle weakness (generalized): Secondary | ICD-10-CM | POA: Diagnosis not present

## 2019-02-21 DIAGNOSIS — M6281 Muscle weakness (generalized): Secondary | ICD-10-CM | POA: Diagnosis not present

## 2019-02-23 ENCOUNTER — Encounter (INDEPENDENT_AMBULATORY_CARE_PROVIDER_SITE_OTHER): Payer: Self-pay

## 2019-02-28 DIAGNOSIS — I7389 Other specified peripheral vascular diseases: Secondary | ICD-10-CM | POA: Diagnosis not present

## 2019-03-04 DIAGNOSIS — I739 Peripheral vascular disease, unspecified: Secondary | ICD-10-CM | POA: Diagnosis not present

## 2019-03-04 DIAGNOSIS — Z89421 Acquired absence of other right toe(s): Secondary | ICD-10-CM | POA: Diagnosis not present

## 2019-03-04 DIAGNOSIS — B351 Tinea unguium: Secondary | ICD-10-CM | POA: Diagnosis not present

## 2019-03-04 DIAGNOSIS — L84 Corns and callosities: Secondary | ICD-10-CM | POA: Diagnosis not present

## 2019-03-11 DIAGNOSIS — G478 Other sleep disorders: Secondary | ICD-10-CM | POA: Diagnosis not present

## 2019-03-25 DIAGNOSIS — I7389 Other specified peripheral vascular diseases: Secondary | ICD-10-CM | POA: Diagnosis not present

## 2019-04-19 ENCOUNTER — Inpatient Hospital Stay: Payer: Medicare Other | Admitting: Oncology

## 2019-04-19 ENCOUNTER — Encounter: Payer: Self-pay | Admitting: Oncology

## 2019-04-19 ENCOUNTER — Inpatient Hospital Stay: Payer: Medicare Other

## 2019-04-27 DIAGNOSIS — D649 Anemia, unspecified: Secondary | ICD-10-CM | POA: Diagnosis not present

## 2019-05-02 DIAGNOSIS — I7389 Other specified peripheral vascular diseases: Secondary | ICD-10-CM | POA: Diagnosis not present

## 2019-05-06 DIAGNOSIS — Z03818 Encounter for observation for suspected exposure to other biological agents ruled out: Secondary | ICD-10-CM | POA: Diagnosis not present

## 2019-05-24 DIAGNOSIS — B351 Tinea unguium: Secondary | ICD-10-CM | POA: Diagnosis not present

## 2019-05-24 DIAGNOSIS — L84 Corns and callosities: Secondary | ICD-10-CM | POA: Diagnosis not present

## 2019-05-24 DIAGNOSIS — I739 Peripheral vascular disease, unspecified: Secondary | ICD-10-CM | POA: Diagnosis not present

## 2019-05-24 DIAGNOSIS — Z89421 Acquired absence of other right toe(s): Secondary | ICD-10-CM | POA: Diagnosis not present

## 2019-05-31 DIAGNOSIS — G478 Other sleep disorders: Secondary | ICD-10-CM | POA: Diagnosis not present

## 2019-05-31 DIAGNOSIS — I10 Essential (primary) hypertension: Secondary | ICD-10-CM | POA: Diagnosis not present

## 2019-05-31 DIAGNOSIS — I82409 Acute embolism and thrombosis of unspecified deep veins of unspecified lower extremity: Secondary | ICD-10-CM | POA: Diagnosis not present

## 2019-05-31 DIAGNOSIS — Z89612 Acquired absence of left leg above knee: Secondary | ICD-10-CM | POA: Diagnosis not present

## 2019-05-31 DIAGNOSIS — I7389 Other specified peripheral vascular diseases: Secondary | ICD-10-CM | POA: Diagnosis not present

## 2019-05-31 DIAGNOSIS — J984 Other disorders of lung: Secondary | ICD-10-CM | POA: Diagnosis not present

## 2019-05-31 DIAGNOSIS — I5189 Other ill-defined heart diseases: Secondary | ICD-10-CM | POA: Diagnosis not present

## 2019-05-31 DIAGNOSIS — E7849 Other hyperlipidemia: Secondary | ICD-10-CM | POA: Diagnosis not present

## 2019-05-31 DIAGNOSIS — K2100 Gastro-esophageal reflux disease with esophagitis, without bleeding: Secondary | ICD-10-CM | POA: Diagnosis not present

## 2019-05-31 DIAGNOSIS — E538 Deficiency of other specified B group vitamins: Secondary | ICD-10-CM | POA: Diagnosis not present

## 2019-05-31 DIAGNOSIS — R600 Localized edema: Secondary | ICD-10-CM | POA: Diagnosis not present

## 2019-05-31 DIAGNOSIS — D500 Iron deficiency anemia secondary to blood loss (chronic): Secondary | ICD-10-CM | POA: Diagnosis not present

## 2019-06-02 DIAGNOSIS — Z03818 Encounter for observation for suspected exposure to other biological agents ruled out: Secondary | ICD-10-CM | POA: Diagnosis not present

## 2019-06-08 DIAGNOSIS — Z03818 Encounter for observation for suspected exposure to other biological agents ruled out: Secondary | ICD-10-CM | POA: Diagnosis not present

## 2019-06-15 DIAGNOSIS — Z03818 Encounter for observation for suspected exposure to other biological agents ruled out: Secondary | ICD-10-CM | POA: Diagnosis not present

## 2019-06-22 DIAGNOSIS — Z03818 Encounter for observation for suspected exposure to other biological agents ruled out: Secondary | ICD-10-CM | POA: Diagnosis not present

## 2019-06-29 DIAGNOSIS — Z03818 Encounter for observation for suspected exposure to other biological agents ruled out: Secondary | ICD-10-CM | POA: Diagnosis not present

## 2019-07-05 DIAGNOSIS — H5461 Unqualified visual loss, right eye, normal vision left eye: Secondary | ICD-10-CM | POA: Diagnosis not present

## 2019-07-06 DIAGNOSIS — Z03818 Encounter for observation for suspected exposure to other biological agents ruled out: Secondary | ICD-10-CM | POA: Diagnosis not present

## 2019-07-06 DIAGNOSIS — H2513 Age-related nuclear cataract, bilateral: Secondary | ICD-10-CM | POA: Diagnosis not present

## 2019-07-08 DIAGNOSIS — M6281 Muscle weakness (generalized): Secondary | ICD-10-CM | POA: Diagnosis not present

## 2019-07-12 DIAGNOSIS — D649 Anemia, unspecified: Secondary | ICD-10-CM | POA: Diagnosis not present

## 2019-07-12 DIAGNOSIS — Z79899 Other long term (current) drug therapy: Secondary | ICD-10-CM | POA: Diagnosis not present

## 2019-07-13 DIAGNOSIS — I82512 Chronic embolism and thrombosis of left femoral vein: Secondary | ICD-10-CM | POA: Diagnosis not present

## 2019-07-13 DIAGNOSIS — I7389 Other specified peripheral vascular diseases: Secondary | ICD-10-CM | POA: Diagnosis not present

## 2019-07-13 DIAGNOSIS — D72828 Other elevated white blood cell count: Secondary | ICD-10-CM | POA: Diagnosis not present

## 2019-07-13 DIAGNOSIS — J984 Other disorders of lung: Secondary | ICD-10-CM | POA: Diagnosis not present

## 2019-07-13 DIAGNOSIS — I10 Essential (primary) hypertension: Secondary | ICD-10-CM | POA: Diagnosis not present

## 2019-07-13 DIAGNOSIS — Z03818 Encounter for observation for suspected exposure to other biological agents ruled out: Secondary | ICD-10-CM | POA: Diagnosis not present

## 2019-07-14 DIAGNOSIS — D720 Genetic anomalies of leukocytes: Secondary | ICD-10-CM | POA: Diagnosis not present

## 2019-07-14 DIAGNOSIS — D649 Anemia, unspecified: Secondary | ICD-10-CM | POA: Diagnosis not present

## 2019-07-14 DIAGNOSIS — N390 Urinary tract infection, site not specified: Secondary | ICD-10-CM | POA: Diagnosis not present

## 2019-07-14 DIAGNOSIS — Z79899 Other long term (current) drug therapy: Secondary | ICD-10-CM | POA: Diagnosis not present

## 2019-07-14 DIAGNOSIS — R319 Hematuria, unspecified: Secondary | ICD-10-CM | POA: Diagnosis not present

## 2019-07-19 DIAGNOSIS — Z03818 Encounter for observation for suspected exposure to other biological agents ruled out: Secondary | ICD-10-CM | POA: Diagnosis not present

## 2019-07-25 DIAGNOSIS — I509 Heart failure, unspecified: Secondary | ICD-10-CM | POA: Diagnosis not present

## 2019-07-25 DIAGNOSIS — H25041 Posterior subcapsular polar age-related cataract, right eye: Secondary | ICD-10-CM | POA: Diagnosis not present

## 2019-07-27 DIAGNOSIS — Z03818 Encounter for observation for suspected exposure to other biological agents ruled out: Secondary | ICD-10-CM | POA: Diagnosis not present

## 2019-07-27 DIAGNOSIS — D649 Anemia, unspecified: Secondary | ICD-10-CM | POA: Diagnosis not present

## 2019-07-27 DIAGNOSIS — E1136 Type 2 diabetes mellitus with diabetic cataract: Secondary | ICD-10-CM | POA: Diagnosis not present

## 2019-08-05 ENCOUNTER — Encounter: Payer: Self-pay | Admitting: Ophthalmology

## 2019-08-17 ENCOUNTER — Other Ambulatory Visit: Payer: Self-pay

## 2019-08-17 ENCOUNTER — Other Ambulatory Visit
Admission: RE | Admit: 2019-08-17 | Discharge: 2019-08-17 | Disposition: A | Discharge status: Home / Self Care | Payer: Medicare Other | Source: Ambulatory Visit | Attending: Ophthalmology | Admitting: Ophthalmology

## 2019-08-17 DIAGNOSIS — Z20828 Contact with and (suspected) exposure to other viral communicable diseases: Secondary | ICD-10-CM | POA: Diagnosis not present

## 2019-08-17 DIAGNOSIS — Z01812 Encounter for preprocedural laboratory examination: Secondary | ICD-10-CM | POA: Insufficient documentation

## 2019-08-17 LAB — SARS CORONAVIRUS 2 (TAT 6-24 HRS): SARS Coronavirus 2: NEGATIVE

## 2019-08-18 ENCOUNTER — Ambulatory Visit: Payer: Medicare Other | Admitting: Registered Nurse

## 2019-08-18 ENCOUNTER — Ambulatory Visit
Admission: RE | Admit: 2019-08-18 | Discharge: 2019-08-18 | Disposition: A | Discharge status: Home / Self Care | Payer: Medicare Other | Attending: Ophthalmology | Admitting: Ophthalmology

## 2019-08-18 ENCOUNTER — Encounter: Payer: Self-pay | Admitting: Ophthalmology

## 2019-08-18 ENCOUNTER — Encounter
Admission: RE | Disposition: A | Discharge status: Home / Self Care | Payer: Self-pay | Source: Home / Self Care | Attending: Ophthalmology

## 2019-08-18 DIAGNOSIS — I509 Heart failure, unspecified: Secondary | ICD-10-CM | POA: Diagnosis not present

## 2019-08-18 DIAGNOSIS — M109 Gout, unspecified: Secondary | ICD-10-CM | POA: Insufficient documentation

## 2019-08-18 DIAGNOSIS — I110 Hypertensive heart disease with heart failure: Secondary | ICD-10-CM | POA: Insufficient documentation

## 2019-08-18 DIAGNOSIS — I739 Peripheral vascular disease, unspecified: Secondary | ICD-10-CM | POA: Diagnosis not present

## 2019-08-18 DIAGNOSIS — K219 Gastro-esophageal reflux disease without esophagitis: Secondary | ICD-10-CM | POA: Insufficient documentation

## 2019-08-18 DIAGNOSIS — Z7901 Long term (current) use of anticoagulants: Secondary | ICD-10-CM | POA: Diagnosis not present

## 2019-08-18 DIAGNOSIS — Z7982 Long term (current) use of aspirin: Secondary | ICD-10-CM | POA: Diagnosis not present

## 2019-08-18 DIAGNOSIS — Z89612 Acquired absence of left leg above knee: Secondary | ICD-10-CM | POA: Insufficient documentation

## 2019-08-18 DIAGNOSIS — J449 Chronic obstructive pulmonary disease, unspecified: Secondary | ICD-10-CM | POA: Insufficient documentation

## 2019-08-18 DIAGNOSIS — H2511 Age-related nuclear cataract, right eye: Secondary | ICD-10-CM | POA: Insufficient documentation

## 2019-08-18 DIAGNOSIS — E785 Hyperlipidemia, unspecified: Secondary | ICD-10-CM | POA: Insufficient documentation

## 2019-08-18 DIAGNOSIS — Z87891 Personal history of nicotine dependence: Secondary | ICD-10-CM | POA: Insufficient documentation

## 2019-08-18 DIAGNOSIS — Z79899 Other long term (current) drug therapy: Secondary | ICD-10-CM | POA: Insufficient documentation

## 2019-08-18 HISTORY — PX: CATARACT EXTRACTION W/PHACO: SHX586

## 2019-08-18 SURGERY — PHACOEMULSIFICATION, CATARACT, WITH IOL INSERTION
Anesthesia: Monitor Anesthesia Care | Laterality: Right

## 2019-08-18 MED ORDER — SODIUM CHLORIDE 0.9 % IV SOLN: INTRAVENOUS | Status: DC

## 2019-08-18 MED ORDER — LIDOCAINE HCL (PF) 4 % IJ SOLN
INTRAMUSCULAR | Status: DC | PRN
  Administered 2019-08-18: 10:00:00 2.5 mL via OPHTHALMIC

## 2019-08-18 MED ORDER — EPINEPHRINE PF 1 MG/ML IJ SOLN: INTRAOCULAR | Status: DC | PRN

## 2019-08-18 MED ORDER — TRYPAN BLUE 0.06 % OP SOLN
OPHTHALMIC | Status: AC
  Filled 2019-08-18: qty 0.5

## 2019-08-18 MED ORDER — TRYPAN BLUE 0.06 % OP SOLN
OPHTHALMIC | Status: DC | PRN
  Administered 2019-08-18: 0.5 mL via INTRAOCULAR

## 2019-08-18 MED ORDER — MIDAZOLAM HCL 2 MG/2ML IJ SOLN
INTRAMUSCULAR | Status: AC
  Filled 2019-08-18: qty 2

## 2019-08-18 MED ORDER — FENTANYL CITRATE (PF) 100 MCG/2ML IJ SOLN
INTRAMUSCULAR | Status: AC
  Filled 2019-08-18: qty 2

## 2019-08-18 MED ORDER — MOXIFLOXACIN HCL 0.5 % OP SOLN
OPHTHALMIC | Status: DC | PRN
  Administered 2019-08-18: 0.2 mL via OPHTHALMIC

## 2019-08-18 MED ORDER — LIDOCAINE HCL (PF) 4 % IJ SOLN
INTRAOCULAR | Status: DC | PRN
  Administered 2019-08-18: 4 mL via OPHTHALMIC

## 2019-08-18 MED ORDER — MOXIFLOXACIN HCL 0.5 % OP SOLN
OPHTHALMIC | Status: AC
  Filled 2019-08-18: qty 3

## 2019-08-18 MED ORDER — FENTANYL CITRATE (PF) 100 MCG/2ML IJ SOLN
INTRAMUSCULAR | Status: DC | PRN
  Administered 2019-08-18: 25 ug via INTRAVENOUS
  Administered 2019-08-18: 50 ug via INTRAVENOUS
  Administered 2019-08-18: 25 ug via INTRAVENOUS

## 2019-08-18 MED ORDER — ONDANSETRON HCL 4 MG/2ML IJ SOLN
INTRAMUSCULAR | Status: AC
  Filled 2019-08-18: qty 2

## 2019-08-18 MED ORDER — ONDANSETRON HCL 4 MG/2ML IJ SOLN
INTRAMUSCULAR | Status: DC | PRN
  Administered 2019-08-18: 4 mg via INTRAVENOUS

## 2019-08-18 MED ORDER — CARBACHOL 0.01 % IO SOLN
INTRAOCULAR | Status: DC | PRN
  Administered 2019-08-18: 0.5 mL via INTRAOCULAR

## 2019-08-18 MED ORDER — PROPOFOL 10 MG/ML IV BOLUS
INTRAVENOUS | Status: DC | PRN
  Administered 2019-08-18: 30 ug via INTRAVENOUS
  Administered 2019-08-18: 10 ug via INTRAVENOUS

## 2019-08-18 MED ORDER — GLYCOPYRROLATE 0.2 MG/ML IJ SOLN
INTRAMUSCULAR | Status: AC
  Filled 2019-08-18: qty 1

## 2019-08-18 MED ORDER — TETRACAINE HCL 0.5 % OP SOLN
OPHTHALMIC | Status: AC
  Administered 2019-08-18: 1 [drp] via OPHTHALMIC
  Filled 2019-08-18: qty 4

## 2019-08-18 MED ORDER — MOXIFLOXACIN HCL 0.5 % OP SOLN - NO CHARGE
1.0000 [drp] | Freq: Once | OPHTHALMIC | Status: AC
  Administered 2019-08-18: 1 [drp] via OPHTHALMIC
  Filled 2019-08-18: qty 3

## 2019-08-18 MED ORDER — GLYCOPYRROLATE PF 0.2 MG/ML IJ SOSY
PREFILLED_SYRINGE | INTRAMUSCULAR | Status: DC | PRN
  Administered 2019-08-18: .2 mg via INTRAVENOUS

## 2019-08-18 MED ORDER — MIDAZOLAM HCL 2 MG/2ML IJ SOLN
INTRAMUSCULAR | Status: DC | PRN
  Administered 2019-08-18 (×3): .5 mg via INTRAVENOUS

## 2019-08-18 MED ORDER — ARMC OPHTHALMIC DILATING DROPS
1.0000 "application " | OPHTHALMIC | Status: AC
  Administered 2019-08-18 (×2): 1 via OPHTHALMIC

## 2019-08-18 MED ORDER — NA CHONDROIT SULF-NA HYALURON 40-17 MG/ML IO SOLN
INTRAOCULAR | Status: DC | PRN
  Administered 2019-08-18 (×2): 1 mL via INTRAOCULAR

## 2019-08-18 MED ORDER — TETRACAINE HCL 0.5 % OP SOLN: 1.0000 [drp] | OPHTHALMIC | Status: DC | PRN

## 2019-08-18 MED ORDER — ARMC OPHTHALMIC DILATING DROPS
OPHTHALMIC | Status: AC
  Administered 2019-08-18: 1 via OPHTHALMIC
  Filled 2019-08-18: qty 0.5

## 2019-08-18 MED ORDER — POVIDONE-IODINE 5 % OP SOLN
OPHTHALMIC | Status: DC | PRN
  Administered 2019-08-18: 1 via OPHTHALMIC

## 2019-08-18 SURGICAL SUPPLY — 21 items
CANNULA ANT/CHMB 27G (MISCELLANEOUS) IMPLANT
CANNULA ANT/CHMB 27GA (MISCELLANEOUS) ×4 IMPLANT
FILTER MILLEX .045 (MISCELLANEOUS) IMPLANT
FLTR MILLEX .045 (MISCELLANEOUS) ×2
GLOVE BIO SURGEON STRL SZ8 (GLOVE) ×2 IMPLANT
GLOVE BIOGEL M 6.5 STRL (GLOVE) ×2 IMPLANT
GLOVE SURG LX 8.0 MICRO (GLOVE) ×1
GLOVE SURG LX STRL 8.0 MICRO (GLOVE) ×1 IMPLANT
GOWN STRL REUS W/ TWL LRG LVL3 (GOWN DISPOSABLE) ×2 IMPLANT
GOWN STRL REUS W/TWL LRG LVL3 (GOWN DISPOSABLE) ×4
LABEL CATARACT MEDS ST (LABEL) ×2 IMPLANT
LENS IOL TECNIS ITEC 20.0 (Intraocular Lens) ×1 IMPLANT
PACK CATARACT (MISCELLANEOUS) ×2 IMPLANT
PACK CATARACT BRASINGTON LX (MISCELLANEOUS) ×2 IMPLANT
PACK EYE AFTER SURG (MISCELLANEOUS) ×2 IMPLANT
RETRACTOR CAPSULE (MISCELLANEOUS) ×1 IMPLANT
SOL BSS BAG (MISCELLANEOUS) ×2
SOLUTION BSS BAG (MISCELLANEOUS) ×1 IMPLANT
SYR 5ML LL (SYRINGE) ×2 IMPLANT
WATER STERILE IRR 250ML POUR (IV SOLUTION) ×2 IMPLANT
WIPE NON LINTING 3.25X3.25 (MISCELLANEOUS) ×2 IMPLANT

## 2019-08-18 NOTE — OR Nursing (Signed)
Pt. Report called to [facility] and medications returned to facility. Pt. Awake and oriented in NAD with VSS. Pt. Wearing dark eye glasses.

## 2019-08-18 NOTE — Anesthesia Post-op Follow-up Note (Signed)
Anesthesia QCDR form completed.        

## 2019-08-18 NOTE — Anesthesia Preprocedure Evaluation (Signed)
Anesthesia Evaluation  Patient identified by MRN, date of birth, ID band Patient awake    Reviewed: Allergy & Precautions, H&P , NPO status , reviewed documented beta blocker date and time   Airway Mallampati: II  TM Distance: >3 FB Neck ROM: full    Dental  (+) Poor Dentition, Chipped, Missing, Dental Advidsory Given VERY poor dentition:   Pulmonary pneumonia, COPD, former smoker,    Pulmonary exam normal        Cardiovascular hypertension, + Peripheral Vascular Disease and +CHF  Normal cardiovascular exam  2019 ECHO Study Conclusions  - Left ventricle: The cavity size was normal. Systolic function was   normal. The estimated ejection fraction was in the range of 60%   to 65%. Wall motion was normal; there were no regional wall   motion abnormalities. Doppler parameters are consistent with   abnormal left ventricular relaxation (grade 1 diastolic   dysfunction). - Left atrium: The atrium was normal in size. - Right ventricle: Systolic function was normal. - Pulmonary arteries: Systolic pressure was mildly elevated. PA   peak pressure: 42 mm Hg (S).   Neuro/Psych  Neuromuscular disease    GI/Hepatic GERD  Controlled,  Endo/Other    Renal/GU Renal disease     Musculoskeletal   Abdominal   Peds  Hematology  (+) Blood dyscrasia, anemia ,   Anesthesia Other Findings Past Medical History: No date: Acute embolism and thombos unsp deep vn unsp lower extremity  ([place]) No date: Acute respiratory failure ([place]) No date: Allergy No date: Anemia No date: Aneurysm of unspecified site [facility]) No date: ARF (acute respiratory failure) (HCC)     Comment:  H/O No date: Bronchitis No date: CHF (congestive heart failure) (HCC) No date: COPD (chronic obstructive pulmonary disease) (HCC) No date: Cough No date: Epistaxis No date: GERD (gastroesophageal reflux disease) No date: Gout No date: Hyperlipidemia No date:  Hypertension No date: Hypokalemia No date: Insomnia No date: Muscle contracture     Comment:  MUSCLE SPASMS, muscle weakness No date: Peripheral vascular disease ([place]) No date: Pneumonia No date: Pressure ulcer  Past Surgical History: 09/19/2015: AMPUTATION; Left     Comment:  Procedure: AMPUTATION BELOW KNEE;  Surgeon: Algernon Huxley,              MD;  Location: [facility] ORS;  Service: Vascular;  Laterality:              Left; 11/01/2015: AMPUTATION; Left     Comment:  Procedure: AMPUTATION ABOVE KNEE;  Surgeon: Algernon Huxley,              MD;  Location: [facility] ORS;  Service: Vascular;  Laterality:              Left; 01/18/6947: APPLICATION OF WOUND VAC; Left     Comment:  Procedure: APPLICATION OF WOUND VAC;  Surgeon: Algernon Huxley, MD;  Location: [facility] ORS;  Service: Vascular;                Laterality: Left; 05/25/2017: COLONOSCOPY WITH PROPOFOL; N/A     Comment:  Procedure: COLONOSCOPY WITH PROPOFOL;  Surgeon: Jonathon Bellows, MD;  Location: [facility] ENDOSCOPY;  Service:               Gastroenterology;  Laterality: N/A; 10/14/2017: COLONOSCOPY WITH PROPOFOL; N/A  Comment:  Procedure: COLONOSCOPY WITH PROPOFOL;  Surgeon: Jonathon Bellows, MD;  Location: [facility] ENDOSCOPY;  Service:               Gastroenterology;  Laterality: N/A; 05/25/2017: ESOPHAGOGASTRODUODENOSCOPY (EGD) WITH PROPOFOL; N/A     Comment:  Procedure: ESOPHAGOGASTRODUODENOSCOPY (EGD) WITH               PROPOFOL;  Surgeon: Jonathon Bellows, MD;  Location: [facility]               ENDOSCOPY;  Service: Gastroenterology;  Laterality: N/A; 10/14/2017: ESOPHAGOGASTRODUODENOSCOPY (EGD) WITH PROPOFOL; N/A     Comment:  Procedure: ESOPHAGOGASTRODUODENOSCOPY (EGD) WITH               PROPOFOL;  Surgeon: Jonathon Bellows, MD;  Location: [facility]               ENDOSCOPY;  Service: Gastroenterology;  Laterality: N/A; 07/14/2017: GIVENS CAPSULE STUDY; N/A     Comment:  Procedure: GIVENS CAPSULE STUDY;  Surgeon: Jonathon Bellows,                MD;  Location: [facility] ENDOSCOPY;  Service:               Gastroenterology;  Laterality: N/A; 10/30/2017: GIVENS CAPSULE STUDY; N/A     Comment:  Procedure: GIVENS CAPSULE STUDY 12 HR;  Surgeon: Jonathon Bellows, MD;  Location: [facility] ENDOSCOPY;  Service:               Gastroenterology;  Laterality: N/A; 10/12/2017: IVC FILTER INSERTION; N/A     Comment:  Procedure: IVC FILTER INSERTION;  Surgeon: Algernon Huxley,              MD;  Location: [place] CV LAB;  Service:               Cardiovascular;  Laterality: N/A; 10/04/2018: LOWER EXTREMITY ANGIOGRAPHY; Right     Comment:  Procedure: LOWER EXTREMITY ANGIOGRAPHY;  Surgeon: Algernon Huxley, MD;  Location: [place] CV LAB;  Service:               Cardiovascular;  Laterality: Right; 01/18/2015: PERIPHERAL VASCULAR CATHETERIZATION; Left     Comment:  Procedure: Lower Extremity Angiography;  Surgeon: Algernon Huxley, MD;  Location: [place] CV LAB;  Service:               Cardiovascular;  Laterality: Left; 01/18/2015: PERIPHERAL VASCULAR CATHETERIZATION; N/A     Comment:  Procedure: Lower Extremity Intervention;  Surgeon: Algernon Huxley, MD;  Location: [place] CV LAB;  Service:               Cardiovascular;  Laterality: N/A; 07/30/2015: PERIPHERAL VASCULAR CATHETERIZATION     Comment:  Procedure: Lower Extremity Intervention;  Surgeon: Algernon Huxley, MD;  Location: [place] CV LAB;  Service:               Cardiovascular;; 07/30/2015: PERIPHERAL VASCULAR CATHETERIZATION; N/A     Comment:  Procedure: Abdominal Aortogram  w/Lower Extremity;                Surgeon: Algernon Huxley, MD;  Location: [place] CV               LAB;  Service: Cardiovascular;  Laterality: N/A; 08/22/2015: PERIPHERAL VASCULAR CATHETERIZATION; Left     Comment:  Procedure: Lower Extremity Angiography;  Surgeon: Algernon Huxley, MD;  Location: [place] CV LAB;  Service:                Cardiovascular;  Laterality: Left; 08/22/2015: PERIPHERAL VASCULAR CATHETERIZATION; Left     Comment:  Procedure: Lower Extremity Intervention;  Surgeon: Algernon Huxley, MD;  Location: [place] CV LAB;  Service:               Cardiovascular;  Laterality: Left; 03/31/2016: PERIPHERAL VASCULAR CATHETERIZATION; Right     Comment:  Procedure: Lower Extremity Angiography;  Surgeon: Algernon Huxley, MD;  Location: [place] CV LAB;  Service:               Cardiovascular;  Laterality: Right; 03/31/2016: PERIPHERAL VASCULAR CATHETERIZATION     Comment:  Procedure: Lower Extremity Intervention;  Surgeon: Algernon Huxley, MD;  Location: [place] CV LAB;  Service:               Cardiovascular;; 04/10/2016: PERIPHERAL VASCULAR CATHETERIZATION; Left     Comment:  Procedure: Lower Extremity Angiography;  Surgeon: Algernon Huxley, MD;  Location: [place] CV LAB;  Service:               Cardiovascular;  Laterality: Left; 10/18/2015: WOUND DEBRIDEMENT; Left     Comment:  Procedure: DEBRIDEMENT WOUND   ( LEFT BKA DEBRIDEMENT );              Surgeon: Algernon Huxley, MD;  Location: [facility] ORS;  Service:               Vascular;  Laterality: Left;  BMI    Body Mass Index: 28.76 kg/m      Reproductive/Obstetrics                             Anesthesia Physical Anesthesia Plan  ASA: III  Anesthesia Plan: MAC   Post-op Pain Management:    Induction: Intravenous  PONV Risk Score and Plan: TIVA and Midazolam  Airway Management Planned: Nasal Cannula and Natural Airway  Additional Equipment:   Intra-op Plan:   Post-operative Plan:   Informed Consent: I have reviewed the patients History and Physical, chart, labs and discussed the procedure including the risks, benefits and alternatives for the proposed anesthesia with the patient or authorized representative who has indicated his/her understanding and acceptance.      Dental Advisory Given  Plan Discussed with: CRNA  Anesthesia Plan Comments:         Anesthesia Quick Evaluation

## 2019-08-18 NOTE — Op Note (Signed)
PREOPERATIVE DIAGNOSIS:  Nuclear sclerotic cataract of the right eye.   POSTOPERATIVE DIAGNOSIS:  NUCLEAR SCLEROTIC CATARACT RIGHT EYE   OPERATIVE PROCEDURE: Procedure(s): CATARACT EXTRACTION PHACO AND INTRAOCULAR LENS PLACEMENT (IOC) RIGHT   SURGEON:  Birder Robson, MD.   ANESTHESIA:  Anesthesiologist: Alphonsus Sias, MD CRNA: Lia Foyer, CRNA  1.      Managed anesthesia care. 2.      0.49ml of Shugarcaine was instilled in the eye following the paracentesis.    3. 2.5 cc of retrobulbar blocked was placed without complication   COMPLICATIONS: 1. Vision Blue was used to stain the anterior capsule due to very poor/ no visualization of the red reflex.                                  2. Four capsular hooks were placed due to severe capsular instability associated with poor zonular support. These were removed after the lens was placed in the capsule. The lens was reasonably well centered at the end of the case.    TECHNIQUE:   Stop and chop   DESCRIPTION OF PROCEDURE:  The patient was examined and consented in the preoperative holding area where the aforementioned topical anesthesia was applied to the right eye and then brought back to the Operating Room where the right eye was prepped and draped in the usual sterile ophthalmic fashion and a lid speculum was placed. A paracentesis was created with the side port blade and the anterior chamber was filled with viscoelastic. A near clear corneal incision was performed with the steel keratome. A continuous curvilinear capsulorrhexis was performed with a cystotome followed by the capsulorrhexis forceps. Hydrodissection and hydrodelineation were carried out with BSS on a blunt cannula. The lens was removed in a stop and chop  technique and the remaining cortical material was removed with the irrigation-aspiration handpiece. The capsular bag was inflated with viscoelastic and the Technis [id number]  lens was placed in the capsular bag without  complication. The remaining viscoelastic was removed from the eye with the irrigation-aspiration handpiece. The wounds were hydrated. The anterior chamber was flushed with Miostat and the eye was inflated to physiologic pressure. 0.27ml of Vigamox was placed in the anterior chamber. The wounds were found to be water tight. The eye was dressed with Vigamox. The patient was given protective glasses to wear throughout the day and a shield with which to sleep tonight. The patient was also given drops with which to begin a drop regimen today and will follow-up with me in one day. Implant Name Type Inv. Item Serial No. Manufacturer Lot No. LRB No. Used Action  LENS IOL DIOP 20.0 - [id number] Intraocular Lens LENS IOL DIOP 20.0 [id number] 2009 [employer]  Right 1 Implanted   Procedure(s) with comments: CATARACT EXTRACTION PHACO AND INTRAOCULAR LENS PLACEMENT (IOC) RIGHT (Right) - [place] 03:29.0 CDE 45.68 Fluid Pack Lot # [id number]  Electronically signed: Birder Robson 08/18/2019 10:37 AM

## 2019-08-18 NOTE — Transfer of Care (Signed)
Immediate Anesthesia Transfer of Care Note  Patient: Timothy Juarez  Procedure(s) Performed: CATARACT EXTRACTION PHACO AND INTRAOCULAR LENS PLACEMENT (IOC) RIGHT (Right )  Patient Location: PACU  Anesthesia Type:MAC  Level of Consciousness: awake, drowsy and patient cooperative  Airway & Oxygen Therapy: Patient Spontanous Breathing  Post-op Assessment: Report given to RN and Post -op Vital signs reviewed and stable  Post vital signs: Reviewed and stable  Last Vitals:  Vitals Value Taken Time  BP    Temp    Pulse    Resp    SpO2      Last Pain:  Vitals:   08/18/19 0843  TempSrc: Temporal  PainSc: 0-No pain         Complications: No apparent anesthesia complications

## 2019-08-18 NOTE — Discharge Instructions (Signed)

## 2019-08-18 NOTE — H&P (Signed)
All labs reviewed. Abnormal studies sent to patients PCP when indicated.  Previous H&P reviewed, patient examined, there are NO CHANGES.  Timothy Butrick Porfilio12/31/20209:21 AM

## 2019-08-18 NOTE — Anesthesia Postprocedure Evaluation (Signed)
Anesthesia Post Note  Patient: MAAHIR HORST  Procedure(s) Performed: CATARACT EXTRACTION PHACO AND INTRAOCULAR LENS PLACEMENT (IOC) RIGHT (Right )  Patient location during evaluation: Phase II Anesthesia Type: MAC Level of consciousness: awake and alert Pain management: pain level controlled Vital Signs Assessment: post-procedure vital signs reviewed and stable Respiratory status: spontaneous breathing, nonlabored ventilation and respiratory function stable Cardiovascular status: blood pressure returned to baseline and stable Postop Assessment: no apparent nausea or vomiting Anesthetic complications: no     Last Vitals:  Vitals:   08/18/19 0843 08/18/19 1043  BP: (!) 149/66 137/68  Pulse: 64 79  Resp: 18 17  Temp: (!) 36 C 36.7 C  SpO2: 99% 97%    Last Pain:  Vitals:   08/18/19 1043  TempSrc: Tympanic  PainSc: 0-No pain                 Alphonsus Sias

## 2019-09-14 ENCOUNTER — Telehealth (INDEPENDENT_AMBULATORY_CARE_PROVIDER_SITE_OTHER): Payer: Self-pay | Admitting: Vascular Surgery

## 2019-09-15 ENCOUNTER — Other Ambulatory Visit (INDEPENDENT_AMBULATORY_CARE_PROVIDER_SITE_OTHER): Payer: Self-pay | Admitting: Nurse Practitioner

## 2019-09-15 DIAGNOSIS — I739 Peripheral vascular disease, unspecified: Secondary | ICD-10-CM

## 2019-09-15 NOTE — Telephone Encounter (Signed)
In order to check for an abscess we would need a CT to rule it out.  Once he has the CT we can bring him and discuss next steps.  He would need a CT angio of the lower extremities w/wo contrast.  Let me know if we need to order or if they can through their facility

## 2019-09-15 NOTE — Telephone Encounter (Signed)
Please advise 

## 2019-09-15 NOTE — Telephone Encounter (Signed)
I contacted [name] with the below instructions. Per [name] they will order the CTA and call us back to schedule an in office appointment for Timothy Juarez.

## 2019-09-15 NOTE — Telephone Encounter (Signed)
Mrs boone called back to check the status of this

## 2019-09-21 ENCOUNTER — Other Ambulatory Visit (INDEPENDENT_AMBULATORY_CARE_PROVIDER_SITE_OTHER): Payer: Self-pay | Admitting: Nurse Practitioner

## 2019-09-21 ENCOUNTER — Ambulatory Visit
Admission: RE | Admit: 2019-09-21 | Discharge: 2019-09-21 | Disposition: A | Discharge status: Home / Self Care | Payer: Medicare Other | Source: Ambulatory Visit | Attending: Nurse Practitioner | Admitting: Nurse Practitioner

## 2019-09-21 ENCOUNTER — Other Ambulatory Visit: Payer: Self-pay

## 2019-09-21 DIAGNOSIS — I739 Peripheral vascular disease, unspecified: Secondary | ICD-10-CM | POA: Diagnosis present

## 2019-09-21 LAB — POCT I-STAT CREATININE: Creatinine, Ser: 1.3 mg/dL — ABNORMAL HIGH (ref 0.61–1.24)

## 2019-09-21 MED ORDER — IOHEXOL 300 MG/ML  SOLN
100.0000 mL | Freq: Once | INTRAMUSCULAR | Status: AC | PRN
  Administered 2019-09-21: 100 mL via INTRAVENOUS

## 2019-09-27 ENCOUNTER — Inpatient Hospital Stay
Admission: EM | Admit: 2019-09-27 | Discharge: 2019-10-07 | DRG: 500 | Disposition: A | Discharge status: Skilled Nursing Facility | Payer: Medicare Other | Attending: Internal Medicine | Admitting: Internal Medicine

## 2019-09-27 ENCOUNTER — Encounter: Payer: Self-pay | Admitting: Emergency Medicine

## 2019-09-27 ENCOUNTER — Encounter (INDEPENDENT_AMBULATORY_CARE_PROVIDER_SITE_OTHER): Payer: Self-pay | Admitting: Vascular Surgery

## 2019-09-27 ENCOUNTER — Other Ambulatory Visit: Payer: Self-pay

## 2019-09-27 ENCOUNTER — Emergency Department: Payer: Medicare Other

## 2019-09-27 ENCOUNTER — Ambulatory Visit (INDEPENDENT_AMBULATORY_CARE_PROVIDER_SITE_OTHER): Payer: Medicare Other | Admitting: Vascular Surgery

## 2019-09-27 VITALS — BP 120/77 | HR 92 | Resp 12 | Ht 73.0 in | Wt 210.0 lb

## 2019-09-27 DIAGNOSIS — Z79899 Other long term (current) drug therapy: Secondary | ICD-10-CM

## 2019-09-27 DIAGNOSIS — Y835 Amputation of limb(s) as the cause of abnormal reaction of the patient, or of later complication, without mention of misadventure at the time of the procedure: Secondary | ICD-10-CM | POA: Diagnosis not present

## 2019-09-27 DIAGNOSIS — G4700 Insomnia, unspecified: Secondary | ICD-10-CM | POA: Diagnosis present

## 2019-09-27 DIAGNOSIS — Z8616 Personal history of covid-19: Secondary | ICD-10-CM | POA: Diagnosis not present

## 2019-09-27 DIAGNOSIS — E876 Hypokalemia: Secondary | ICD-10-CM | POA: Diagnosis not present

## 2019-09-27 DIAGNOSIS — D509 Iron deficiency anemia, unspecified: Secondary | ICD-10-CM | POA: Diagnosis present

## 2019-09-27 DIAGNOSIS — N186 End stage renal disease: Secondary | ICD-10-CM | POA: Diagnosis present

## 2019-09-27 DIAGNOSIS — I10 Essential (primary) hypertension: Secondary | ICD-10-CM

## 2019-09-27 DIAGNOSIS — Z95828 Presence of other vascular implants and grafts: Secondary | ICD-10-CM

## 2019-09-27 DIAGNOSIS — N179 Acute kidney failure, unspecified: Secondary | ICD-10-CM | POA: Diagnosis not present

## 2019-09-27 DIAGNOSIS — K219 Gastro-esophageal reflux disease without esophagitis: Secondary | ICD-10-CM | POA: Diagnosis present

## 2019-09-27 DIAGNOSIS — R001 Bradycardia, unspecified: Secondary | ICD-10-CM | POA: Diagnosis not present

## 2019-09-27 DIAGNOSIS — I5032 Chronic diastolic (congestive) heart failure: Secondary | ICD-10-CM | POA: Diagnosis not present

## 2019-09-27 DIAGNOSIS — L02419 Cutaneous abscess of limb, unspecified: Secondary | ICD-10-CM

## 2019-09-27 DIAGNOSIS — I82502 Chronic embolism and thrombosis of unspecified deep veins of left lower extremity: Secondary | ICD-10-CM | POA: Diagnosis present

## 2019-09-27 DIAGNOSIS — R339 Retention of urine, unspecified: Secondary | ICD-10-CM | POA: Diagnosis not present

## 2019-09-27 DIAGNOSIS — R000 Tachycardia, unspecified: Secondary | ICD-10-CM | POA: Diagnosis not present

## 2019-09-27 DIAGNOSIS — Z87891 Personal history of nicotine dependence: Secondary | ICD-10-CM

## 2019-09-27 DIAGNOSIS — Z8249 Family history of ischemic heart disease and other diseases of the circulatory system: Secondary | ICD-10-CM

## 2019-09-27 DIAGNOSIS — T8744 Infection of amputation stump, left lower extremity: Secondary | ICD-10-CM | POA: Diagnosis not present

## 2019-09-27 DIAGNOSIS — L03119 Cellulitis of unspecified part of limb: Secondary | ICD-10-CM

## 2019-09-27 DIAGNOSIS — R197 Diarrhea, unspecified: Secondary | ICD-10-CM | POA: Diagnosis not present

## 2019-09-27 DIAGNOSIS — I739 Peripheral vascular disease, unspecified: Secondary | ICD-10-CM | POA: Diagnosis not present

## 2019-09-27 DIAGNOSIS — L03116 Cellulitis of left lower limb: Secondary | ICD-10-CM | POA: Diagnosis not present

## 2019-09-27 DIAGNOSIS — J302 Other seasonal allergic rhinitis: Secondary | ICD-10-CM | POA: Diagnosis present

## 2019-09-27 DIAGNOSIS — R571 Hypovolemic shock: Secondary | ICD-10-CM | POA: Diagnosis not present

## 2019-09-27 DIAGNOSIS — L0291 Cutaneous abscess, unspecified: Secondary | ICD-10-CM | POA: Diagnosis present

## 2019-09-27 DIAGNOSIS — I9581 Postprocedural hypotension: Secondary | ICD-10-CM | POA: Diagnosis not present

## 2019-09-27 DIAGNOSIS — E669 Obesity, unspecified: Secondary | ICD-10-CM | POA: Diagnosis present

## 2019-09-27 DIAGNOSIS — J984 Other disorders of lung: Secondary | ICD-10-CM

## 2019-09-27 DIAGNOSIS — G8918 Other acute postprocedural pain: Secondary | ICD-10-CM | POA: Diagnosis not present

## 2019-09-27 DIAGNOSIS — J449 Chronic obstructive pulmonary disease, unspecified: Secondary | ICD-10-CM | POA: Diagnosis present

## 2019-09-27 DIAGNOSIS — E785 Hyperlipidemia, unspecified: Secondary | ICD-10-CM | POA: Diagnosis not present

## 2019-09-27 DIAGNOSIS — L02416 Cutaneous abscess of left lower limb: Secondary | ICD-10-CM

## 2019-09-27 DIAGNOSIS — I5030 Unspecified diastolic (congestive) heart failure: Secondary | ICD-10-CM

## 2019-09-27 DIAGNOSIS — I959 Hypotension, unspecified: Secondary | ICD-10-CM | POA: Diagnosis present

## 2019-09-27 DIAGNOSIS — Z01818 Encounter for other preprocedural examination: Secondary | ICD-10-CM

## 2019-09-27 DIAGNOSIS — I132 Hypertensive heart and chronic kidney disease with heart failure and with stage 5 chronic kidney disease, or end stage renal disease: Secondary | ICD-10-CM | POA: Diagnosis present

## 2019-09-27 DIAGNOSIS — M109 Gout, unspecified: Secondary | ICD-10-CM | POA: Diagnosis present

## 2019-09-27 DIAGNOSIS — Z452 Encounter for adjustment and management of vascular access device: Secondary | ICD-10-CM

## 2019-09-27 DIAGNOSIS — T8740 Infection of amputation stump, unspecified extremity: Secondary | ICD-10-CM

## 2019-09-27 DIAGNOSIS — D62 Acute posthemorrhagic anemia: Secondary | ICD-10-CM | POA: Diagnosis not present

## 2019-09-27 DIAGNOSIS — Z6827 Body mass index (BMI) 27.0-27.9, adult: Secondary | ICD-10-CM

## 2019-09-27 DIAGNOSIS — Z7901 Long term (current) use of anticoagulants: Secondary | ICD-10-CM

## 2019-09-27 DIAGNOSIS — I70229 Atherosclerosis of native arteries of extremities with rest pain, unspecified extremity: Secondary | ICD-10-CM | POA: Diagnosis present

## 2019-09-27 HISTORY — DX: Cutaneous abscess of limb, unspecified: L02.419

## 2019-09-27 HISTORY — DX: Cellulitis of unspecified part of limb: L03.119

## 2019-09-27 HISTORY — DX: Infection of amputation stump, unspecified extremity: T87.40

## 2019-09-27 LAB — CBC WITH DIFFERENTIAL/PLATELET
Abs Immature Granulocytes: 0.17 10*3/uL — ABNORMAL HIGH (ref 0.00–0.07)
Basophils Absolute: 0.1 10*3/uL (ref 0.0–0.1)
Basophils Relative: 1 %
Eosinophils Absolute: 0.1 10*3/uL (ref 0.0–0.5)
Eosinophils Relative: 1 %
HCT: 39.6 % (ref 39.0–52.0)
Hemoglobin: 12.3 g/dL — ABNORMAL LOW (ref 13.0–17.0)
Immature Granulocytes: 1 %
Lymphocytes Relative: 15 %
Lymphs Abs: 2.5 10*3/uL (ref 0.7–4.0)
MCH: 23.5 pg — ABNORMAL LOW (ref 26.0–34.0)
MCHC: 31.1 g/dL (ref 30.0–36.0)
MCV: 75.7 fL — ABNORMAL LOW (ref 80.0–100.0)
Monocytes Absolute: 1.2 10*3/uL — ABNORMAL HIGH (ref 0.1–1.0)
Monocytes Relative: 7 %
Neutro Abs: 12.5 10*3/uL — ABNORMAL HIGH (ref 1.7–7.7)
Neutrophils Relative %: 75 %
Platelets: 452 10*3/uL — ABNORMAL HIGH (ref 150–400)
RBC: 5.23 MIL/uL (ref 4.22–5.81)
RDW: 15.8 % — ABNORMAL HIGH (ref 11.5–15.5)
WBC: 16.5 10*3/uL — ABNORMAL HIGH (ref 4.0–10.5)
nRBC: 0 % (ref 0.0–0.2)

## 2019-09-27 LAB — COMPREHENSIVE METABOLIC PANEL
ALT: 13 U/L (ref 0–44)
AST: 18 U/L (ref 15–41)
Albumin: 2.8 g/dL — ABNORMAL LOW (ref 3.5–5.0)
Alkaline Phosphatase: 122 U/L (ref 38–126)
Anion gap: 11 (ref 5–15)
BUN: 15 mg/dL (ref 8–23)
CO2: 24 mmol/L (ref 22–32)
Calcium: 8.7 mg/dL — ABNORMAL LOW (ref 8.9–10.3)
Chloride: 103 mmol/L (ref 98–111)
Creatinine, Ser: 1.26 mg/dL — ABNORMAL HIGH (ref 0.61–1.24)
GFR calc Af Amer: >60 mL/min (ref >60)
GFR calc non Af Amer: 58 mL/min — ABNORMAL LOW (ref >60)
Glucose, Bld: 111 mg/dL — ABNORMAL HIGH (ref 70–99)
Potassium: 3.5 mmol/L (ref 3.5–5.1)
Sodium: 138 mmol/L (ref 135–145)
Total Bilirubin: 0.9 mg/dL (ref 0.3–1.2)
Total Protein: 7.6 g/dL (ref 6.5–8.1)

## 2019-09-27 LAB — PROTIME-INR
INR: 1.2 (ref 0.8–1.2)
Prothrombin Time: 14.9 seconds (ref 11.4–15.2)

## 2019-09-27 LAB — SAMPLE TO BLOOD BANK

## 2019-09-27 LAB — LACTIC ACID, PLASMA: Lactic Acid, Venous: 1.3 mmol/L (ref 0.5–1.9)

## 2019-09-27 MED ORDER — PIPERACILLIN-TAZOBACTAM 3.375 G IVPB 30 MIN
3.3750 g | Freq: Once | INTRAVENOUS | Status: AC
  Administered 2019-09-27: 3.375 g via INTRAVENOUS
  Filled 2019-09-27: qty 50

## 2019-09-27 MED ORDER — IOHEXOL 300 MG/ML  SOLN
100.0000 mL | Freq: Once | INTRAMUSCULAR | Status: AC | PRN
  Administered 2019-09-27: 100 mL via INTRAVENOUS

## 2019-09-27 MED ORDER — HYDROMORPHONE HCL 1 MG/ML IJ SOLN
INTRAMUSCULAR | Status: AC
  Administered 2019-09-27: 1 mg
  Filled 2019-09-27: qty 1

## 2019-09-27 MED ORDER — VANCOMYCIN HCL IN DEXTROSE 1-5 GM/200ML-% IV SOLN
1000.0000 mg | Freq: Once | INTRAVENOUS | Status: AC
  Administered 2019-09-27: 1000 mg via INTRAVENOUS
  Filled 2019-09-27: qty 200

## 2019-09-27 NOTE — ED Provider Notes (Addendum)
[facility] Emergency Department Provider Note       Time seen: ----------------------------------------- 9:50 PM on 09/27/2019 -----------------------------------------   I have reviewed the triage vital signs and the nursing notes.  HISTORY   Chief Complaint Abscess   HPI Timothy Juarez is a 69 y.o. male with a history of peripheral vascular disease, respiratory failure, anemia, CHF, COPD, hyperlipidemia, hypertension, pneumonia who presents to the ED for a bleeding left leg wound.  Patient reports an abscess to his left stump that is continuously bleeding and unable to control the bleeding.  Patient reports finishing antibiotics yesterday.  Blood and pus has been pouring out of the wound today.  Pain is 10 out of 10 in his left stump.  Past Medical History:  Diagnosis Date  . Acute embolism and thombos unsp deep vn unsp lower extremity ([place])   . Acute respiratory failure ([place])   . Allergy   . Anemia   . Aneurysm of unspecified site ([place])   . ARF (acute respiratory failure) ([place])    H/O  . Bronchitis   . CHF (congestive heart failure) ([place])   . COPD (chronic obstructive pulmonary disease) ([place])   . Cough   . Epistaxis   . GERD (gastroesophageal reflux disease)   . Gout   . Hyperlipidemia   . Hypertension   . Hypokalemia   . Insomnia   . Muscle contracture    MUSCLE SPASMS, muscle weakness  . Peripheral vascular disease ([place])   . Pneumonia   . Pressure ulcer     Patient Active Problem List   Diagnosis Date Noted  . Cellulitis and abscess of leg 09/27/2019  . Acute on chronic respiratory failure with hypoxia ([place]) 12/27/2018  . Community acquired bilateral lower lobe pneumonia 12/27/2018  . COVID-19 virus infection 12/27/2018  . High anion gap metabolic acidosis 53/97/6734  . Severe sepsis ([place]) 12/27/2018  . AKI (acute kidney injury) ([place]) 12/27/2018  . Toxic encephalopathy 12/27/2018  . Acute respiratory failure with hypoxia ([place])  12/27/2018  . Hx of AKA (above knee amputation), left ([place]) 11/02/2018  . Symptomatic anemia 10/10/2017  . Gastrointestinal hemorrhage   . Chronic deep vein thrombosis (DVT) of left lower extremity ([place])   . Iron deficiency anemia 04/21/2017  . Essential hypertension 03/13/2017  . Recurrent deep vein thrombosis (DVT) ([place]) 03/11/2017  . Hyperlipidemia 11/04/2016  . Pressure ulcer 04/11/2016  . Pseudoaneurysm of left femoral artery ([place]) 04/11/2016  . PAD (peripheral artery disease) ([place]) 04/10/2016  . Atherosclerosis of native arteries of extremities with gangrene, left leg ([place]) 11/01/2015  . Ischemia of lower extremity 09/14/2015  . Atherosclerotic peripheral vascular disease with ulceration ([place]) 01/18/2015    Past Surgical History:  Procedure Laterality Date  . AMPUTATION Left 09/19/2015   Procedure: AMPUTATION BELOW KNEE;  Surgeon: Algernon Huxley, MD;  Location: [facility] ORS;  Service: Vascular;  Laterality: Left;  . AMPUTATION Left 11/01/2015   Procedure: AMPUTATION ABOVE KNEE;  Surgeon: Algernon Huxley, MD;  Location: [facility] ORS;  Service: Vascular;  Laterality: Left;  . APPLICATION OF WOUND VAC Left 10/18/2015   Procedure: APPLICATION OF WOUND VAC;  Surgeon: Algernon Huxley, MD;  Location: [facility] ORS;  Service: Vascular;  Laterality: Left;  . CATARACT EXTRACTION W/PHACO Right 08/18/2019   Procedure: CATARACT EXTRACTION PHACO AND INTRAOCULAR LENS PLACEMENT ([place]) RIGHT;  Surgeon: Birder Robson, MD;  Location: [facility] ORS;  Service: Ophthalmology;  Laterality: Right;  [place] 03:29.0 CDE 45.68 Fluid Pack Lot # [id number]  .  COLONOSCOPY WITH PROPOFOL N/A 05/25/2017   Procedure: COLONOSCOPY WITH PROPOFOL;  Surgeon: Jonathon Bellows, MD;  Location: [facility] ENDOSCOPY;  Service: Gastroenterology;  Laterality: N/A;  . COLONOSCOPY WITH PROPOFOL N/A 10/14/2017   Procedure: COLONOSCOPY WITH PROPOFOL;  Surgeon: Jonathon Bellows, MD;  Location: [facility] ENDOSCOPY;  Service: Gastroenterology;  Laterality: N/A;  .  ESOPHAGOGASTRODUODENOSCOPY (EGD) WITH PROPOFOL N/A 05/25/2017   Procedure: ESOPHAGOGASTRODUODENOSCOPY (EGD) WITH PROPOFOL;  Surgeon: Jonathon Bellows, MD;  Location: [facility] ENDOSCOPY;  Service: Gastroenterology;  Laterality: N/A;  . ESOPHAGOGASTRODUODENOSCOPY (EGD) WITH PROPOFOL N/A 10/14/2017   Procedure: ESOPHAGOGASTRODUODENOSCOPY (EGD) WITH PROPOFOL;  Surgeon: Jonathon Bellows, MD;  Location: [facility] ENDOSCOPY;  Service: Gastroenterology;  Laterality: N/A;  . EYE SURGERY    . GIVENS CAPSULE STUDY N/A 07/14/2017   Procedure: GIVENS CAPSULE STUDY;  Surgeon: Jonathon Bellows, MD;  Location: [facility] ENDOSCOPY;  Service: Gastroenterology;  Laterality: N/A;  . GIVENS CAPSULE STUDY N/A 10/30/2017   Procedure: GIVENS CAPSULE STUDY 12 HR;  Surgeon: Jonathon Bellows, MD;  Location: [facility] ENDOSCOPY;  Service: Gastroenterology;  Laterality: N/A;  . IVC FILTER INSERTION N/A 10/12/2017   Procedure: IVC FILTER INSERTION;  Surgeon: Algernon Huxley, MD;  Location: [place] CV LAB;  Service: Cardiovascular;  Laterality: N/A;  . LOWER EXTREMITY ANGIOGRAPHY Right 10/04/2018   Procedure: LOWER EXTREMITY ANGIOGRAPHY;  Surgeon: Algernon Huxley, MD;  Location: [place] CV LAB;  Service: Cardiovascular;  Laterality: Right;  . PERIPHERAL VASCULAR CATHETERIZATION Left 01/18/2015   Procedure: Lower Extremity Angiography;  Surgeon: Algernon Huxley, MD;  Location: [place] CV LAB;  Service: Cardiovascular;  Laterality: Left;  . PERIPHERAL VASCULAR CATHETERIZATION N/A 01/18/2015   Procedure: Lower Extremity Intervention;  Surgeon: Algernon Huxley, MD;  Location: [place] CV LAB;  Service: Cardiovascular;  Laterality: N/A;  . PERIPHERAL VASCULAR CATHETERIZATION  07/30/2015   Procedure: Lower Extremity Intervention;  Surgeon: Algernon Huxley, MD;  Location: [place] CV LAB;  Service: Cardiovascular;;  . PERIPHERAL VASCULAR CATHETERIZATION N/A 07/30/2015   Procedure: Abdominal Aortogram w/Lower Extremity;  Surgeon: Algernon Huxley, MD;  Location: [place]  CV LAB;  Service: Cardiovascular;  Laterality: N/A;  . PERIPHERAL VASCULAR CATHETERIZATION Left 08/22/2015   Procedure: Lower Extremity Angiography;  Surgeon: Algernon Huxley, MD;  Location: [place] CV LAB;  Service: Cardiovascular;  Laterality: Left;  . PERIPHERAL VASCULAR CATHETERIZATION Left 08/22/2015   Procedure: Lower Extremity Intervention;  Surgeon: Algernon Huxley, MD;  Location: [place] CV LAB;  Service: Cardiovascular;  Laterality: Left;  . PERIPHERAL VASCULAR CATHETERIZATION Right 03/31/2016   Procedure: Lower Extremity Angiography;  Surgeon: Algernon Huxley, MD;  Location: [place] CV LAB;  Service: Cardiovascular;  Laterality: Right;  . PERIPHERAL VASCULAR CATHETERIZATION  03/31/2016   Procedure: Lower Extremity Intervention;  Surgeon: Algernon Huxley, MD;  Location: [place] CV LAB;  Service: Cardiovascular;;  . PERIPHERAL VASCULAR CATHETERIZATION Left 04/10/2016   Procedure: Lower Extremity Angiography;  Surgeon: Algernon Huxley, MD;  Location: [place] CV LAB;  Service: Cardiovascular;  Laterality: Left;  . WOUND DEBRIDEMENT Left 10/18/2015   Procedure: DEBRIDEMENT WOUND   ( LEFT BKA DEBRIDEMENT );  Surgeon: Algernon Huxley, MD;  Location: [facility] ORS;  Service: Vascular;  Laterality: Left;    Allergies Patient has no known allergies.  Social History Social History   Tobacco Use  . Smoking status: Former Smoker    Packs/day: 1.00    Years: 44.00    Pack years: 44.00    Types: Cigarettes    Quit date:  11/17/2012    Years since quitting: 6.8  . Smokeless tobacco: Never Used  Substance Use Topics  . Alcohol use: No  . Drug use: No    Review of Systems Constitutional: Negative for fever. Cardiovascular: Negative for chest pain. Respiratory: Negative for shortness of breath. Gastrointestinal: Negative for abdominal pain, vomiting and diarrhea. Musculoskeletal: Positive for left upper leg pain Skin: Positive for bleeding and purulent drainage from the left leg  stump Neurological: Negative for headaches, focal weakness or numbness.  All systems negative/normal/unremarkable except as stated in the HPI  ____________________________________________   PHYSICAL EXAM:  VITAL SIGNS: ED Triage Vitals [09/27/19 2141]  Enc Vitals Group     BP      Pulse      Resp      Temp      Temp src      SpO2      Weight 210 lb (95.3 kg)     Height 6\' 1"  (1.854 m)     Head Circumference      Peak Flow      Pain Score 10     Pain Loc      Pain Edu?      Excl. in [place]?     Constitutional: Alert and oriented. Well appearing and in no distress. Eyes: Conjunctivae are normal. Normal extraocular movements. ENT      Head: Normocephalic and atraumatic.      Nose: No congestion/rhinnorhea.      Mouth/Throat: Mucous membranes are moist.      Neck: No stridor. Cardiovascular: Normal rate, regular rhythm. No murmurs, rubs, or gallops. Respiratory: Normal respiratory effort without tachypnea nor retractions. Breath sounds are clear and equal bilaterally. No wheezes/rales/rhonchi. Gastrointestinal: Soft and nontender. Normal bowel sounds Musculoskeletal: Left lower extremity above the knee amputation, left stump is diffusely swollen and tender, with obvious induration and overlying skin changes.  In the upper inner left thigh there is a copiously draining abscess of blood and pus. Neurologic:  Normal speech and language. No gross focal neurologic deficits are appreciated.  Skin: Left lower extremity abscess with induration and erythema Psychiatric: Mood and affect are normal. Speech and behavior are normal.  ____________________________________________  EKG: Interpreted by me.  Atrial fibrillation with a rate of 92 bpm, low voltage, nonspecific ST segment changes, normal QT  ____________________________________________  ED COURSE:  As part of my medical decision making, I reviewed the following data within the [place] History obtained from  family if available, nursing notes, old chart and ekg, as well as notes from prior ED visits. Patient presented for a left leg wound with purulent drainage and bleeding, we will assess with labs and imaging as indicated at this time.   Procedures  DENCIL CAYSON was evaluated in Emergency Department on 09/27/2019 for the symptoms described in the history of present illness. He was evaluated in the context of the global COVID-19 pandemic, which necessitated consideration that the patient might be at risk for infection with the SARS-CoV-2 virus that causes COVID-19. Institutional protocols and algorithms that pertain to the evaluation of patients at risk for COVID-19 are in a state of rapid change based on information released by regulatory bodies including the CDC and federal and state organizations. These policies and algorithms were followed during the patient's care in the ED.  ____________________________________________   LABS (pertinent positives/negatives)  Labs Reviewed  COMPREHENSIVE METABOLIC PANEL - Abnormal; Notable for the following components:      Result Value  Glucose, Bld 111 (*)    Creatinine, Ser 1.26 (*)    Calcium 8.7 (*)    Albumin 2.8 (*)    GFR calc non Af Amer 58 (*)    All other components within normal limits  CBC WITH DIFFERENTIAL/PLATELET - Abnormal; Notable for the following components:   WBC 16.5 (*)    Hemoglobin 12.3 (*)    MCV 75.7 (*)    MCH 23.5 (*)    RDW 15.8 (*)    Platelets 452 (*)    Neutro Abs 12.5 (*)    Monocytes Absolute 1.2 (*)    Abs Immature Granulocytes 0.17 (*)    All other components within normal limits  CULTURE, BLOOD (ROUTINE X 2)  CULTURE, BLOOD (ROUTINE X 2)  URINE CULTURE  AEROBIC/ANAEROBIC CULTURE (SURGICAL/DEEP WOUND)  LACTIC ACID, PLASMA  PROTIME-INR  LACTIC ACID, PLASMA  URINALYSIS, ROUTINE W REFLEX MICROSCOPIC  SAMPLE TO BLOOD BANK   CRITICAL CARE Performed by: Laurence Aly   Total critical care time:  30 minutes  Critical care time was exclusive of separately billable procedures and treating other patients.  Critical care was necessary to treat or prevent imminent or life-threatening deterioration.  Critical care was time spent personally by me on the following activities: development of treatment plan with patient and/or surrogate as well as nursing, discussions with consultants, evaluation of patient's response to treatment, examination of patient, obtaining history from patient or surrogate, ordering and performing treatments and interventions, ordering and review of laboratory studies, ordering and review of radiographic studies, pulse oximetry and re-evaluation of patient's condition.  RADIOLOGY Images were viewed by me  Chest x-ray, CT left femur  IMPRESSION:  Chronic changes. No acute cardiopulmonary disease.  CT left femur reveals large left leg abscess involving stent graft IMPRESSION:  1. Decreased size of heterogeneous collection of the left thigh,  measuring 10.4 x 17.1 x 6.2 cm, likely a combination of hematoma and  abscess. The collection extends to the medial thigh skin surface.  2. No osteolysis or other bony irregularity.  ____________________________________________   DIFFERENTIAL DIAGNOSIS   Abscess, cellulitis, end-stage renal disease, necrotizing fasciitis, anemia, osteomyelitis  FINAL ASSESSMENT AND PLAN  Left stump abscess   Plan: The patient had presented for a left stump abscess with copious purulent drainage and bleeding.  The patient was started on broad-spectrum antibiotics including vancomycin and Zosyn.  He was given IV Dilaudid for pain.  We actually used wall suction on the left stump and initially got out about 150 cc of pus and blood.  Patient's labs did reveal significant leukocytosis. Patient's imaging revealed a large abscess in his left stump as dictated above.  I have discussed with surgery and vascular surgery.  Vascular surgery has  requested admission to the hospitalist service.  He will attempt to operate on the patient tomorrow.   Laurence Aly, MD    Note: This note was generated in part or whole with voice recognition software. Voice recognition is usually quite accurate but there are transcription errors that can and very often do occur. I apologize for any typographical errors that were not detected and corrected.     Earleen Newport, MD 09/27/19 1655    Earleen Newport, MD 09/27/19 [phone]

## 2019-09-27 NOTE — Assessment & Plan Note (Signed)
The patient has a fairly extensive abscess in the left thigh with old Viabahn stents in the SFA in the middle of the abscess which was likely the source.  This will need to be drained and the stents will need to be removed.  Have discussed that this will require open surgical therapy likely with a 2 to 3-day hospitalization.  I have discussed that he may have an open wound and this may have prolonged recovery.  He voices his understanding is agreeable to proceed.

## 2019-09-27 NOTE — Assessment & Plan Note (Signed)
Has lost the left leg but now has an abscess in his left stump.  This will need to be evacuated.  Right leg perfusion appears stable and had an extensive revascularization about a year ago.  Was checked later in the year and was okay.  Can be checked once we get his left leg sorted out.

## 2019-09-27 NOTE — Progress Notes (Signed)
MRN : [id number]  Timothy Juarez is a 69 y.o. (29-Jan-1951) male who presents with chief complaint of  Chief Complaint  Patient presents with   Follow-up    review CT results  .  History of Present Illness: Patient returns today in follow up after a CT scan to evaluate the pain and swelling in his left AKA stump.  This is very tender to palpation and markedly swollen.  I have independently reviewed his CT scan and there is a large abscess on the medial left thigh area in conjunction with an old stent.  The official report reads of a dialysis access, but that is not what this is as he is not a dialysis patient. He is having some low-grade fevers and chills.  He is very uncomfortable.  This has been steadily progressing over several weeks to months.  No clear inciting event or causative factor that started the symptoms.   Current Outpatient Medications  Medication Sig Dispense Refill   acetaminophen (TYLENOL) 325 MG tablet Take 650 mg by mouth every 4 (four) hours as needed for fever.      aspirin EC 81 MG tablet Take 1 tablet (81 mg total) by mouth daily. (Patient taking differently: Take 81 mg by mouth every morning. (1000)) 150 tablet 2   atorvastatin (LIPITOR) 10 MG tablet Take 1 tablet (10 mg total) by mouth daily. (Patient taking differently: Take 10 mg by mouth at bedtime. (2200)) 30 tablet 11   baclofen (LIORESAL) 10 MG tablet Take 10 mg by mouth 3 (three) times daily. Hold for sedation (1000, 1400, & 2200)     Difluprednate (DUREZOL) 0.05 % EMUL Place 1 drop into the right eye 2 (two) times daily. (1000 & 1800)     docusate sodium (COLACE) 100 MG capsule Take 100 mg by mouth daily as needed for mild constipation.     ELIQUIS 5 MG TABS tablet Take 5 mg by mouth 2 (two) times daily. (1000 & 2200)     fentaNYL (DURAGESIC) 12 MCG/HR Place 1 patch onto the skin every 3 (three) days. 1 patch 0   ferrous sulfate 324 (65 Fe) MG TBEC Take 324 mg by mouth 2 (two) times daily.  (0900 &1800)     furosemide (LASIX) 40 MG tablet Take 1 tablet (40 mg total) by mouth daily. (Patient taking differently: Take 40 mg by mouth every morning. ) 30 tablet    gabapentin (NEURONTIN) 100 MG capsule Take 100 mg by mouth at bedtime. (2200)     guaiFENesin (ROBITUSSIN) 100 MG/5ML SOLN Take 15 mLs by mouth 3 (three) times daily as needed (cough/congestion.).      loperamide (IMODIUM) 2 MG capsule Take 2 mg by mouth 3 (three) times daily as needed for diarrhea or loose stools.     Melatonin 5 MG TABS Take 5 mg by mouth at bedtime as needed (insomnia).      nepafenac (ILEVRO) 0.3 % ophthalmic suspension Place 1 drop into the right eye daily.     nitroGLYCERIN (NITROSTAT) 0.4 MG SL tablet Place 0.14 mg under the tongue every 5 (five) minutes as needed for chest pain.     Omega-3 Fatty Acids (FISH OIL) 1000 MG CAPS Take 1,000 mg by mouth every morning.     Oxycodone HCl 10 MG TABS Take 1 tablet (10 mg total) by mouth 3 (three) times daily as needed (moderate pain). (Patient taking differently: Take 10 mg by mouth 2 (two) times daily as needed (breakthrough pain). )  9 tablet 0   oxymetazoline (AFRIN) 0.05 % nasal spray Place 2 sprays into both nostrils 2 (two) times daily as needed (For nose bleeds).      potassium chloride SA (K-DUR,KLOR-CON) 20 MEQ tablet Take 20 mEq by mouth daily at 12 noon. With or after meal     sodium chloride (OCEAN) 0.65 % SOLN nasal spray Place 2 sprays into both nostrils 2 (two) times daily as needed for congestion.     TRELEGY ELLIPTA 100-62.5-25 MCG/INH AEPB Inhale 1 puff into the lungs every morning. (1000)     No current facility-administered medications for this visit.    Past Medical History:  Diagnosis Date   Acute embolism and [place] unsp deep vn unsp lower extremity (HCC)    Acute respiratory failure (HCC)    Allergy    Anemia    Aneurysm of unspecified site (HCC)    ARF (acute respiratory failure) (HCC)    H/O   Bronchitis     CHF (congestive heart failure) (HCC)    COPD (chronic obstructive pulmonary disease) (HCC)    Cough    Epistaxis    GERD (gastroesophageal reflux disease)    Gout    Hyperlipidemia    Hypertension    Hypokalemia    Insomnia    Muscle contracture    MUSCLE SPASMS, muscle weakness   Peripheral vascular disease ([place])    Pneumonia    Pressure ulcer     Past Surgical History:  Procedure Laterality Date   AMPUTATION Left 09/19/2015   Procedure: AMPUTATION BELOW KNEE;  Surgeon: Algernon Huxley, MD;  Location: [facility];  Service: Vascular;  Laterality: Left;   AMPUTATION Left 11/01/2015   Procedure: AMPUTATION ABOVE KNEE;  Surgeon: Algernon Huxley, MD;  Location: [facility];  Service: Vascular;  Laterality: Left;   APPLICATION OF WOUND VAC Left 10/18/2015   Procedure: APPLICATION OF WOUND VAC;  Surgeon: Algernon Huxley, MD;  Location: [facility];  Service: Vascular;  Laterality: Left;   CATARACT EXTRACTION W/PHACO Right 08/18/2019   Procedure: CATARACT EXTRACTION PHACO AND INTRAOCULAR LENS PLACEMENT ([place]) RIGHT;  Surgeon: Birder Robson, MD;  Location: [facility];  Service: Ophthalmology;  Laterality: Right;  [place] 03:29.0 CDE 45.68 Fluid Pack Lot # [id number]   COLONOSCOPY WITH PROPOFOL N/A 05/25/2017   Procedure: COLONOSCOPY WITH PROPOFOL;  Surgeon: Jonathon Bellows, MD;  Location: [facility] ENDOSCOPY;  Service: Gastroenterology;  Laterality: N/A;   COLONOSCOPY WITH PROPOFOL N/A 10/14/2017   Procedure: COLONOSCOPY WITH PROPOFOL;  Surgeon: Jonathon Bellows, MD;  Location: [facility] ENDOSCOPY;  Service: Gastroenterology;  Laterality: N/A;   ESOPHAGOGASTRODUODENOSCOPY (EGD) WITH PROPOFOL N/A 05/25/2017   Procedure: ESOPHAGOGASTRODUODENOSCOPY (EGD) WITH PROPOFOL;  Surgeon: Jonathon Bellows, MD;  Location: [facility] ENDOSCOPY;  Service: Gastroenterology;  Laterality: N/A;   ESOPHAGOGASTRODUODENOSCOPY (EGD) WITH PROPOFOL N/A 10/14/2017   Procedure: ESOPHAGOGASTRODUODENOSCOPY (EGD) WITH PROPOFOL;  Surgeon: Jonathon Bellows, MD;   Location: [facility] ENDOSCOPY;  Service: Gastroenterology;  Laterality: N/A;   EYE SURGERY     GIVENS CAPSULE STUDY N/A 07/14/2017   Procedure: GIVENS CAPSULE STUDY;  Surgeon: Jonathon Bellows, MD;  Location: [facility] ENDOSCOPY;  Service: Gastroenterology;  Laterality: N/A;   GIVENS CAPSULE STUDY N/A 10/30/2017   Procedure: GIVENS CAPSULE STUDY 12 HR;  Surgeon: Jonathon Bellows, MD;  Location: [facility] ENDOSCOPY;  Service: Gastroenterology;  Laterality: N/A;   IVC FILTER INSERTION N/A 10/12/2017   Procedure: IVC FILTER INSERTION;  Surgeon: Algernon Huxley, MD;  Location: [place] [facility] LAB;  Service: Cardiovascular;  Laterality: N/A;  LOWER EXTREMITY ANGIOGRAPHY Right 10/04/2018   Procedure: LOWER EXTREMITY ANGIOGRAPHY;  Surgeon: Algernon Huxley, MD;  Location: [place] [facility];  Service: Cardiovascular;  Laterality: Right;   PERIPHERAL VASCULAR CATHETERIZATION Left 01/18/2015   Procedure: Lower Extremity Angiography;  Surgeon: Algernon Huxley, MD;  Location: [place] [facility];  Service: Cardiovascular;  Laterality: Left;   PERIPHERAL VASCULAR CATHETERIZATION N/A 01/18/2015   Procedure: Lower Extremity Intervention;  Surgeon: Algernon Huxley, MD;  Location: [place] [facility];  Service: Cardiovascular;  Laterality: N/A;   PERIPHERAL VASCULAR CATHETERIZATION  07/30/2015   Procedure: Lower Extremity Intervention;  Surgeon: Algernon Huxley, MD;  Location: [place] [facility];  Service: Cardiovascular;;   PERIPHERAL VASCULAR CATHETERIZATION N/A 07/30/2015   Procedure: Abdominal Aortogram w/Lower Extremity;  Surgeon: Algernon Huxley, MD;  Location: [place] [facility];  Service: Cardiovascular;  Laterality: N/A;   PERIPHERAL VASCULAR CATHETERIZATION Left 08/22/2015   Procedure: Lower Extremity Angiography;  Surgeon: Algernon Huxley, MD;  Location: [place] [facility];  Service: Cardiovascular;  Laterality: Left;   PERIPHERAL VASCULAR CATHETERIZATION Left 08/22/2015   Procedure: Lower Extremity Intervention;  Surgeon: Algernon Huxley, MD;   Location: [place] [facility];  Service: Cardiovascular;  Laterality: Left;   PERIPHERAL VASCULAR CATHETERIZATION Right 03/31/2016   Procedure: Lower Extremity Angiography;  Surgeon: Algernon Huxley, MD;  Location: [place] [facility];  Service: Cardiovascular;  Laterality: Right;   PERIPHERAL VASCULAR CATHETERIZATION  03/31/2016   Procedure: Lower Extremity Intervention;  Surgeon: Algernon Huxley, MD;  Location: [place] [facility];  Service: Cardiovascular;;   PERIPHERAL VASCULAR CATHETERIZATION Left 04/10/2016   Procedure: Lower Extremity Angiography;  Surgeon: Algernon Huxley, MD;  Location: [place] [facility];  Service: Cardiovascular;  Laterality: Left;   WOUND DEBRIDEMENT Left 10/18/2015   Procedure: DEBRIDEMENT WOUND   ( LEFT BKA DEBRIDEMENT );  Surgeon: Algernon Huxley, MD;  Location: [facility];  Service: Vascular;  Laterality: Left;     Social History   Tobacco Use   Smoking status: Former Smoker    Packs/day: 1.00    Years: 44.00    Pack years: 44.00    Types: Cigarettes    Quit date: 11/17/2012    Years since quitting: 6.8   Smokeless tobacco: Never Used  Substance Use Topics   Alcohol use: No   Drug use: No    Family History  Problem Relation Age of Onset   Heart attack Mother    Varicose Veins Neg Hx   no bleeding or clotting disorders  No Known Allergies   REVIEW OF SYSTEMS (Negative unless checked) Review of Systems: Negative Unless Checked Constitutional: [] ?Weight loss[] ?Fever[] ?Chills Cardiac:[] ?Chest pain[] ? Atrial Fibrillation[] ?Palpitations [] ?Shortness of breath when laying flat [] ?Shortness of breath with exertion. [] ?Shortness of breath at rest Vascular: [] ?Pain in legs with walking[] ?Pain in legswith standing[] ?Pain in legs when laying flat [] ?Claudication  [] ?Pain in feet when laying flat [] ?History of DVT [] ?Phlebitis [x] ?Swelling in legs [] ?Varicose veins [] ?Non-healing ulcers Pulmonary: [] ?Uses home oxygen  [] ?Productive cough[] ?Hemoptysis [] ?Wheeze [] ?COPD [] ?Asthma Neurologic: [] ?Dizziness[] ?Seizures [] ?Blackouts[] ?History of stroke [] ?History of TIA[] ?Aphasia [] ?Temporary Blindness[] ?Weaknessor numbness in arm [x] ?Weakness or numbnessin leg Musculoskeletal:[x] ?Joint swelling [] ?Joint pain [x] ?Low back pain  [] ? History of Knee Replacement [] ?Arthritis [] ?back Surgeries[] ? Spinal Stenosis  Hematologic:[] ?Easy bruising[] ?Easy bleeding [] ?Hypercoagulable state [] ?Anemic Gastrointestinal:[] ?Diarrhea [] ?Vomiting[x] ?Gastroesophageal reflux/heartburn[] ?Difficulty swallowing. [] ?Abdominal pain Genitourinary: [] ?Chronic kidney disease [] ?Difficulturination [] ?Anuric[] ?Blood in urine [] ?Frequenturination [] ?Burning with urination[] ?Hematuria Skin: [] ?Rashes [] ?Ulcers [] ?Wounds Psychological: [] ?History of anxiety[] ?History of major depression  [] ? Memory Difficulties  Physical Examination  BP 120/77 (BP Location: Left Arm)    Pulse 92    Resp 12    Ht 6\' 1"  (1.854 m)    Wt 210 lb (95.3 kg)    BMI 27.71 kg/m  Gen:  WD/WN, NAD Head: [place]/AT, No temporalis wasting. Ear/Nose/Throat: Hearing grossly intact, nares w/o erythema or drainage Eyes: Conjunctiva clear. Sclera non-icteric Neck: Supple.  Trachea midline Pulmonary:  Good air movement, no use of accessory muscles.  Cardiac: RRR, slightly tachycardic Vascular:  Vessel Right Left  Radial Palpable Palpable                          PT  not palpable  not palpable  DP  not palpable  not palpable   Gastrointestinal: soft, non-tender/non-distended.  Musculoskeletal: M/S 5/5 throughout.  No deformity or atrophy.  The left AKA has marked edema.  It is tender to palpation particularly medially and inferiorly no open wounds or drainage currently. Neurologic: Sensation grossly intact in extremities.  Symmetrical.  Speech is fluent.  Psychiatric: Judgment intact, Mood & affect  appropriate for pt's clinical situation. Dermatologic: No rashes or ulcers noted.  No cellulitis or open wounds.       Labs Recent Results (from the past 2160 hour(s))  SARS CORONAVIRUS 2 (TAT 6-24 HRS) Nasopharyngeal Nasopharyngeal Swab     Status: None   Collection Time: 08/17/19 10:10 AM   Specimen: Nasopharyngeal Swab  Result Value Ref Range   SARS Coronavirus 2 NEGATIVE NEGATIVE    Comment: (NOTE) SARS-CoV-2 target nucleic acids are NOT DETECTED. The SARS-CoV-2 RNA is generally detectable in upper and lower respiratory specimens during the acute phase of infection. Negative results do not preclude SARS-CoV-2 infection, do not rule out co-infections with other pathogens, and should not be used as the sole basis for treatment or other patient management decisions. Negative results must be combined with clinical observations, patient history, and epidemiological information. The expected result is Negative. Fact Sheet for Patients: SugarRoll.be Fact Sheet for Healthcare Providers: https://www.woods-mathews.com/ This test is not yet approved or cleared by the [place] FDA and  has been authorized for detection and/or diagnosis of SARS-CoV-2 by FDA under an Emergency Use Authorization (EUA). This EUA will remain  in effect (meaning this test can be used) for the duration of the COVID-19 declaration under Section 56 4(b)(1) of the Act, 21 U.S.C. section 360bbb-3(b)(1), unless the authorization is terminated or revoked sooner. Performed at [facility], [place] [address], [place], [place] [address]   I-STAT creatinine     Status: Abnormal   Collection Time: 09/21/19  9:08 AM  Result Value Ref Range   Creatinine, Ser 1.30 (H) 0.61 - 1.24 mg/dL    Radiology CT FEMUR LEFT W CONTRAST  Result Date: 09/21/2019 CLINICAL DATA:  Left lower extremity abscess. EXAM: CT OF THE LOWER RIGHT EXTREMITY WITH CONTRAST, CT OF THE LEFT LOWER  EXTREMITY WITH CONTRAST TECHNIQUE: Multidetector CT imaging of the lower right extremity was performed according to the standard protocol following intravenous contrast administration. COMPARISON:  None. CONTRAST:  143mL OMNIPAQUE IOHEXOL 300 MG/ML  SOLN FINDINGS: Large complex rim enhancing abscess involving the medial aspect of the left upper and mid thigh. This measures approximately 20 x 12 x 9 cm. It is surrounding a remote AV fistula catheter. Significant surrounding cellulitis. No subcutaneous abscess. Advanced/rest of osteoporosis involving the left femur but no findings suspicious for osteomyelitis. Evidence of prior above the knee amputation. Extensive vascular calcifications  noted. AP left external iliac artery stent is noted along with surgical changes involving the left femoral artery. The right hip is intact. No findings for septic arthritis. The pubic symphysis is intact. No significant intrapelvic abnormalities. Marked atrophy of the right thigh musculature likely related to the amputation. The right femur is intact. The surrounding musculature appears normal. No mass or abscess. Right femoral artery graft is noted extending down to the popliteal artery. There is a focus of remote appearing AVN involving the right hip. IMPRESSION: 1. 20 x 12 x 9 cm complex rim enhancing abscess involving the medial aspect of the left upper and mid thigh surrounding a remote AV fistula catheter. 2. Significant surrounding cellulitis but no subcutaneous abscess. 3. No CT findings for septic arthritis or osteomyelitis. 4. Normal right femur and right thigh musculature. 5. Small focus of AVN involving the right femoral head. 6. Right femoral artery graft in place without complicating features. Electronically Signed   By: Marijo Sanes M.D.   On: 09/21/2019 09:56   CT FEMUR RIGHT W CONTRAST  Result Date: 09/21/2019 CLINICAL DATA:  Left lower extremity abscess. EXAM: CT OF THE LOWER RIGHT EXTREMITY WITH CONTRAST, CT OF  THE LEFT LOWER EXTREMITY WITH CONTRAST TECHNIQUE: Multidetector CT imaging of the lower right extremity was performed according to the standard protocol following intravenous contrast administration. COMPARISON:  None. CONTRAST:  120mL OMNIPAQUE IOHEXOL 300 MG/ML  SOLN FINDINGS: Large complex rim enhancing abscess involving the medial aspect of the left upper and mid thigh. This measures approximately 20 x 12 x 9 cm. It is surrounding a remote AV fistula catheter. Significant surrounding cellulitis. No subcutaneous abscess. Advanced/rest of osteoporosis involving the left femur but no findings suspicious for osteomyelitis. Evidence of prior above the knee amputation. Extensive vascular calcifications noted. AP left external iliac artery stent is noted along with surgical changes involving the left femoral artery. The right hip is intact. No findings for septic arthritis. The pubic symphysis is intact. No significant intrapelvic abnormalities. Marked atrophy of the right thigh musculature likely related to the amputation. The right femur is intact. The surrounding musculature appears normal. No mass or abscess. Right femoral artery graft is noted extending down to the popliteal artery. There is a focus of remote appearing AVN involving the right hip. IMPRESSION: 1. 20 x 12 x 9 cm complex rim enhancing abscess involving the medial aspect of the left upper and mid thigh surrounding a remote AV fistula catheter. 2. Significant surrounding cellulitis but no subcutaneous abscess. 3. No CT findings for septic arthritis or osteomyelitis. 4. Normal right femur and right thigh musculature. 5. Small focus of AVN involving the right femoral head. 6. Right femoral artery graft in place without complicating features. Electronically Signed   By: Marijo Sanes M.D.   On: 09/21/2019 09:56    Assessment/Plan Hyperlipidemia lipid control important in reducing the progression of atherosclerotic disease. Continue statin  therapy   Hypertension blood pressure control important in reducing the progression of atherosclerotic disease. On appropriate oral medications.  PAD (peripheral artery disease) (HCC) Has lost the left leg but now has an abscess in his left stump.  This will need to be evacuated.  Right leg perfusion appears stable and had an extensive revascularization about a year ago.  Was checked later in the year and was okay.  Can be checked once we get his left leg sorted out.  Cellulitis and abscess of leg The patient has a fairly extensive abscess in the left thigh with  old Viabahn stents in the SFA in the middle of the abscess which was likely the source.  This will need to be drained and the stents will need to be removed.  Have discussed that this will require open surgical therapy likely with a 2 to 3-day hospitalization.  I have discussed that he may have an open wound and this may have prolonged recovery.  He voices his understanding is agreeable to proceed.    [name], MD  09/27/2019 4:03 PM    This note was created with Dragon medical transcription system.  Any errors from dictation are purely unintentional

## 2019-09-27 NOTE — Patient Instructions (Signed)
Skin Abscess  A skin abscess is an infected area on or under your skin that contains a collection of pus and other material. An abscess may also be called a furuncle, carbuncle, or boil. An abscess can occur in or on almost any part of your body. Some abscesses break open (rupture) on their own. Most continue to get worse unless they are treated. The infection can spread deeper into the body and eventually into your blood, which can make you feel ill. Treatment usually involves draining the abscess. What are the causes? An abscess occurs when germs, like bacteria, pass through your skin and cause an infection. This may be caused by:  A scrape or cut on your skin.  A puncture wound through your skin, including a needle injection or insect bite.  Blocked oil or sweat glands.  Blocked and infected hair follicles.  A cyst that forms beneath your skin (sebaceous cyst) and becomes infected. What increases the risk? This condition is more likely to develop in people who:  Have a weak body defense system (immune system).  Have diabetes.  Have dry and irritated skin.  Get frequent injections or use illegal IV drugs.  Have a foreign body in a wound, such as a splinter.  Have problems with their lymph system or veins. What are the signs or symptoms? Symptoms of this condition include:  A painful, firm bump under the skin.  A bump with pus at the top. This may break through the skin and drain. Other symptoms include:  Redness surrounding the abscess site.  Warmth.  Swelling of the lymph nodes (glands) near the abscess.  Tenderness.  A sore on the skin. How is this diagnosed? This condition may be diagnosed based on:  A physical exam.  Your medical history.  A sample of pus. This may be used to find out what is causing the infection.  Blood tests.  Imaging tests, such as an ultrasound, CT scan, or MRI. How is this treated? A small abscess that drains on its own may  not need treatment. Treatment for larger abscesses may include:  Moist heat or heat pack applied to the area several times a day.  A procedure to drain the abscess (incision and drainage).  Antibiotic medicines. For a severe abscess, you may first get antibiotics through an IV and then change to antibiotics by mouth. Follow these instructions at home: Medicines   Take over-the-counter and prescription medicines only as told by your health care provider.  If you were prescribed an antibiotic medicine, take it as told by your health care provider. Do not stop taking the antibiotic even if you start to feel better. Abscess care   If you have an abscess that has not drained, apply heat to the affected area. Use the heat source that your health care provider recommends, such as a moist heat pack or a heating pad. ? Place a towel between your skin and the heat source. ? Leave the heat on for 20-30 minutes. ? Remove the heat if your skin turns bright red. This is especially important if you are unable to feel pain, heat, or cold. You may have a greater risk of getting burned.  Follow instructions from your health care provider about how to take care of your abscess. Make sure you: ? Cover the abscess with a bandage (dressing). ? Change your dressing or gauze as told by your health care provider. ? Wash your hands with soap and water before you change the   dressing or gauze. If soap and water are not available, use hand sanitizer.  Check your abscess every day for signs of a worsening infection. Check for: ? More redness, swelling, or pain. ? More fluid or blood. ? Warmth. ? More pus or a bad smell. General instructions  To avoid spreading the infection: ? Do not share personal care items, towels, or hot tubs with others. ? Avoid making skin contact with other people.  Keep all follow-up visits as told by your health care provider. This is important. Contact a health care provider if  you have:  More redness, swelling, or pain around your abscess.  More fluid or blood coming from your abscess.  Warm skin around your abscess.  More pus or a bad smell coming from your abscess.  A fever.  Muscle aches.  Chills or a general ill feeling. Get help right away if you:  Have severe pain.  See red streaks on your skin spreading away from the abscess. Summary  A skin abscess is an infected area on or under your skin that contains a collection of pus and other material.  A small abscess that drains on its own may not need treatment.  Treatment for larger abscesses may include having a procedure to drain the abscess and taking an antibiotic. This information is not intended to replace advice given to you by your health care provider. Make sure you discuss any questions you have with your health care provider. Document Revised: 11/25/2018 Document Reviewed: 09/17/2017 Elsevier Patient Education  2020 [employer]  

## 2019-09-27 NOTE — ED Notes (Signed)
Pt arrived back in room 08 from CT. Pt states that he feels comfortable and denies any further needs at this time.

## 2019-09-27 NOTE — ED Triage Notes (Signed)
Pt arrived via [place] from [place]. Reports an abscess on left stump that is continuously bleeding and unable to control bleeding. Reports pt finished antibiotics yesterday. Upon inspection, abscess is bleeding and pus was oozing out.

## 2019-09-27 NOTE — ED Notes (Signed)
Pt transported to CT ?

## 2019-09-27 NOTE — ED Notes (Signed)
This RN called pt Healthcare POA and daughter, Ebon Ketchum to update her on pt status. Pt daughter aware of pt situation and states that she does not have any further questions at this time.

## 2019-09-28 ENCOUNTER — Encounter: Payer: Self-pay | Admitting: Internal Medicine

## 2019-09-28 ENCOUNTER — Other Ambulatory Visit (INDEPENDENT_AMBULATORY_CARE_PROVIDER_SITE_OTHER): Payer: Self-pay | Admitting: Vascular Surgery

## 2019-09-28 ENCOUNTER — Inpatient Hospital Stay (HOSPITAL_COMMUNITY)
Admit: 2019-09-28 | Discharge: 2019-09-28 | Disposition: A | Discharge status: Home / Self Care | Payer: Medicare Other | Attending: Internal Medicine | Admitting: Internal Medicine

## 2019-09-28 ENCOUNTER — Other Ambulatory Visit: Payer: Self-pay

## 2019-09-28 DIAGNOSIS — D509 Iron deficiency anemia, unspecified: Secondary | ICD-10-CM | POA: Diagnosis not present

## 2019-09-28 DIAGNOSIS — L02416 Cutaneous abscess of left lower limb: Secondary | ICD-10-CM | POA: Diagnosis not present

## 2019-09-28 DIAGNOSIS — I132 Hypertensive heart and chronic kidney disease with heart failure and with stage 5 chronic kidney disease, or end stage renal disease: Secondary | ICD-10-CM | POA: Diagnosis not present

## 2019-09-28 DIAGNOSIS — N179 Acute kidney failure, unspecified: Secondary | ICD-10-CM

## 2019-09-28 DIAGNOSIS — I5030 Unspecified diastolic (congestive) heart failure: Secondary | ICD-10-CM | POA: Diagnosis not present

## 2019-09-28 DIAGNOSIS — G8918 Other acute postprocedural pain: Secondary | ICD-10-CM | POA: Diagnosis not present

## 2019-09-28 DIAGNOSIS — R571 Hypovolemic shock: Secondary | ICD-10-CM | POA: Diagnosis not present

## 2019-09-28 DIAGNOSIS — R001 Bradycardia, unspecified: Secondary | ICD-10-CM | POA: Diagnosis not present

## 2019-09-28 DIAGNOSIS — I739 Peripheral vascular disease, unspecified: Secondary | ICD-10-CM | POA: Diagnosis not present

## 2019-09-28 DIAGNOSIS — J449 Chronic obstructive pulmonary disease, unspecified: Secondary | ICD-10-CM | POA: Diagnosis not present

## 2019-09-28 DIAGNOSIS — J302 Other seasonal allergic rhinitis: Secondary | ICD-10-CM | POA: Diagnosis not present

## 2019-09-28 DIAGNOSIS — Y835 Amputation of limb(s) as the cause of abnormal reaction of the patient, or of later complication, without mention of misadventure at the time of the procedure: Secondary | ICD-10-CM | POA: Diagnosis present

## 2019-09-28 DIAGNOSIS — I5032 Chronic diastolic (congestive) heart failure: Secondary | ICD-10-CM | POA: Diagnosis not present

## 2019-09-28 DIAGNOSIS — E876 Hypokalemia: Secondary | ICD-10-CM | POA: Diagnosis not present

## 2019-09-28 DIAGNOSIS — I82502 Chronic embolism and thrombosis of unspecified deep veins of left lower extremity: Secondary | ICD-10-CM | POA: Diagnosis not present

## 2019-09-28 DIAGNOSIS — I10 Essential (primary) hypertension: Secondary | ICD-10-CM

## 2019-09-28 DIAGNOSIS — I9581 Postprocedural hypotension: Secondary | ICD-10-CM | POA: Diagnosis not present

## 2019-09-28 DIAGNOSIS — Z452 Encounter for adjustment and management of vascular access device: Secondary | ICD-10-CM | POA: Diagnosis not present

## 2019-09-28 DIAGNOSIS — T8744 Infection of amputation stump, left lower extremity: Secondary | ICD-10-CM | POA: Diagnosis not present

## 2019-09-28 DIAGNOSIS — R000 Tachycardia, unspecified: Secondary | ICD-10-CM | POA: Diagnosis not present

## 2019-09-28 DIAGNOSIS — K219 Gastro-esophageal reflux disease without esophagitis: Secondary | ICD-10-CM | POA: Diagnosis not present

## 2019-09-28 DIAGNOSIS — L0291 Cutaneous abscess, unspecified: Secondary | ICD-10-CM

## 2019-09-28 DIAGNOSIS — E785 Hyperlipidemia, unspecified: Secondary | ICD-10-CM | POA: Diagnosis not present

## 2019-09-28 DIAGNOSIS — E669 Obesity, unspecified: Secondary | ICD-10-CM | POA: Diagnosis not present

## 2019-09-28 DIAGNOSIS — L03116 Cellulitis of left lower limb: Secondary | ICD-10-CM | POA: Diagnosis not present

## 2019-09-28 DIAGNOSIS — D62 Acute posthemorrhagic anemia: Secondary | ICD-10-CM | POA: Diagnosis not present

## 2019-09-28 DIAGNOSIS — J984 Other disorders of lung: Secondary | ICD-10-CM

## 2019-09-28 DIAGNOSIS — Z8616 Personal history of COVID-19: Secondary | ICD-10-CM | POA: Diagnosis not present

## 2019-09-28 DIAGNOSIS — R339 Retention of urine, unspecified: Secondary | ICD-10-CM | POA: Diagnosis not present

## 2019-09-28 DIAGNOSIS — N186 End stage renal disease: Secondary | ICD-10-CM | POA: Diagnosis not present

## 2019-09-28 LAB — BASIC METABOLIC PANEL
Anion gap: 9 (ref 5–15)
BUN: 14 mg/dL (ref 8–23)
CO2: 25 mmol/L (ref 22–32)
Calcium: 8.3 mg/dL — ABNORMAL LOW (ref 8.9–10.3)
Chloride: 103 mmol/L (ref 98–111)
Creatinine, Ser: 1.14 mg/dL (ref 0.61–1.24)
GFR calc Af Amer: >60 mL/min (ref >60)
GFR calc non Af Amer: >60 mL/min (ref >60)
Glucose, Bld: 112 mg/dL — ABNORMAL HIGH (ref 70–99)
Potassium: 3.2 mmol/L — ABNORMAL LOW (ref 3.5–5.1)
Sodium: 137 mmol/L (ref 135–145)

## 2019-09-28 LAB — LACTIC ACID, PLASMA: Lactic Acid, Venous: 1.2 mmol/L (ref 0.5–1.9)

## 2019-09-28 LAB — RESPIRATORY PANEL BY RT PCR (FLU A&B, COVID)
Influenza A by PCR: NEGATIVE
Influenza B by PCR: NEGATIVE
SARS Coronavirus 2 by RT PCR: NEGATIVE

## 2019-09-28 LAB — CBC
HCT: 36.2 % — ABNORMAL LOW (ref 39.0–52.0)
Hemoglobin: 11.2 g/dL — ABNORMAL LOW (ref 13.0–17.0)
MCH: 23.5 pg — ABNORMAL LOW (ref 26.0–34.0)
MCHC: 30.9 g/dL (ref 30.0–36.0)
MCV: 76.1 fL — ABNORMAL LOW (ref 80.0–100.0)
Platelets: 413 10*3/uL — ABNORMAL HIGH (ref 150–400)
RBC: 4.76 MIL/uL (ref 4.22–5.81)
RDW: 15.9 % — ABNORMAL HIGH (ref 11.5–15.5)
WBC: 13.6 10*3/uL — ABNORMAL HIGH (ref 4.0–10.5)
nRBC: 0 % (ref 0.0–0.2)

## 2019-09-28 LAB — HEPARIN LEVEL (UNFRACTIONATED): Heparin Unfractionated: 1.61 IU/mL — ABNORMAL HIGH (ref 0.30–0.70)

## 2019-09-28 LAB — ECHOCARDIOGRAM COMPLETE
Height: 73 in
Weight: 3359.99 oz

## 2019-09-28 LAB — APTT: aPTT: 40 seconds — ABNORMAL HIGH (ref 24–36)

## 2019-09-28 LAB — MRSA PCR SCREENING: MRSA by PCR: NEGATIVE

## 2019-09-28 MED ORDER — PIPERACILLIN-TAZOBACTAM 3.375 G IVPB
3.3750 g | Freq: Three times a day (TID) | INTRAVENOUS | Status: DC
  Administered 2019-09-28 – 2019-10-04 (×18): 3.375 g via INTRAVENOUS
  Filled 2019-09-28 (×18): qty 50

## 2019-09-28 MED ORDER — ONDANSETRON HCL 4 MG/2ML IJ SOLN
4.0000 mg | Freq: Four times a day (QID) | INTRAMUSCULAR | Status: DC | PRN
  Administered 2019-09-29: 4 mg via INTRAVENOUS

## 2019-09-28 MED ORDER — MELATONIN 5 MG PO TABS
5.0000 mg | ORAL_TABLET | Freq: Every evening | ORAL | Status: DC | PRN
  Administered 2019-10-02: 5 mg via ORAL
  Filled 2019-09-28: qty 1

## 2019-09-28 MED ORDER — RISAQUAD PO CAPS
1.0000 | ORAL_CAPSULE | Freq: Two times a day (BID) | ORAL | Status: DC
  Administered 2019-09-28 – 2019-10-06 (×16): 1 via ORAL
  Filled 2019-09-28 (×22): qty 1

## 2019-09-28 MED ORDER — VANCOMYCIN HCL 2000 MG/400ML IV SOLN: 2000.0000 mg | INTRAVENOUS | Status: DC

## 2019-09-28 MED ORDER — FLUTICASONE FUROATE-VILANTEROL 100-25 MCG/INH IN AEPB
1.0000 | INHALATION_SPRAY | Freq: Every day | RESPIRATORY_TRACT | Status: DC
  Administered 2019-09-28 – 2019-10-07 (×10): 1 via RESPIRATORY_TRACT
  Filled 2019-09-28 (×2): qty 28

## 2019-09-28 MED ORDER — GABAPENTIN 100 MG PO CAPS
100.0000 mg | ORAL_CAPSULE | Freq: Every day | ORAL | Status: DC
  Administered 2019-09-28 – 2019-10-06 (×9): 100 mg via ORAL
  Filled 2019-09-28 (×9): qty 1

## 2019-09-28 MED ORDER — FLUTICASONE-UMECLIDIN-VILANT 100-62.5-25 MCG/INH IN AEPB: 1.0000 | INHALATION_SPRAY | Freq: Every morning | RESPIRATORY_TRACT | Status: DC

## 2019-09-28 MED ORDER — SODIUM CHLORIDE 0.9 % IV SOLN: INTRAVENOUS | Status: DC

## 2019-09-28 MED ORDER — HEPARIN (PORCINE) 25000 UT/250ML-% IV SOLN
1100.0000 [IU]/h | INTRAVENOUS | Status: DC
  Filled 2019-09-28: qty 250

## 2019-09-28 MED ORDER — TIMOLOL MALEATE 0.5 % OP SOLN
1.0000 [drp] | Freq: Two times a day (BID) | OPHTHALMIC | Status: DC
  Administered 2019-09-28 – 2019-10-07 (×19): 1 [drp] via OPHTHALMIC
  Filled 2019-09-28 (×2): qty 5

## 2019-09-28 MED ORDER — ACETAMINOPHEN 325 MG PO TABS
650.0000 mg | ORAL_TABLET | Freq: Four times a day (QID) | ORAL | Status: DC | PRN
  Administered 2019-09-28 – 2019-09-30 (×3): 650 mg via ORAL
  Filled 2019-09-28 (×5): qty 2

## 2019-09-28 MED ORDER — BRIMONIDINE TARTRATE-TIMOLOL 0.2-0.5 % OP SOLN
1.0000 [drp] | Freq: Two times a day (BID) | OPHTHALMIC | Status: DC
  Filled 2019-09-28: qty 5

## 2019-09-28 MED ORDER — ATORVASTATIN CALCIUM 10 MG PO TABS
10.0000 mg | ORAL_TABLET | Freq: Every day | ORAL | Status: DC
  Administered 2019-09-28 – 2019-10-06 (×9): 10 mg via ORAL
  Filled 2019-09-28 (×9): qty 1

## 2019-09-28 MED ORDER — ACETAMINOPHEN 650 MG RE SUPP: 650.0000 mg | Freq: Four times a day (QID) | RECTAL | Status: DC | PRN

## 2019-09-28 MED ORDER — FERROUS SULFATE 325 (65 FE) MG PO TABS
324.0000 mg | ORAL_TABLET | Freq: Two times a day (BID) | ORAL | Status: DC
  Administered 2019-09-28 – 2019-10-07 (×17): 324 mg via ORAL
  Filled 2019-09-28 (×18): qty 1

## 2019-09-28 MED ORDER — VANCOMYCIN HCL IN DEXTROSE 1-5 GM/200ML-% IV SOLN
1000.0000 mg | Freq: Once | INTRAVENOUS | Status: AC
  Administered 2019-09-28: 1000 mg via INTRAVENOUS
  Filled 2019-09-28 (×2): qty 200

## 2019-09-28 MED ORDER — HEPARIN SODIUM (PORCINE) 5000 UNIT/ML IJ SOLN
5000.0000 [IU] | Freq: Three times a day (TID) | INTRAMUSCULAR | Status: DC
  Administered 2019-09-28: 5000 [IU] via SUBCUTANEOUS
  Filled 2019-09-28: qty 1

## 2019-09-28 MED ORDER — FUROSEMIDE 20 MG PO TABS
40.0000 mg | ORAL_TABLET | Freq: Every day | ORAL | Status: DC
  Administered 2019-09-28: 16:00:00 40 mg via ORAL
  Filled 2019-09-28: qty 1
  Filled 2019-09-28: qty 2

## 2019-09-28 MED ORDER — SENNOSIDES-DOCUSATE SODIUM 8.6-50 MG PO TABS: 1.0000 | ORAL_TABLET | Freq: Every evening | ORAL | Status: DC | PRN

## 2019-09-28 MED ORDER — BACLOFEN 10 MG PO TABS
10.0000 mg | ORAL_TABLET | Freq: Three times a day (TID) | ORAL | Status: DC
  Administered 2019-09-28 – 2019-10-07 (×24): 10 mg via ORAL
  Filled 2019-09-28 (×31): qty 1

## 2019-09-28 MED ORDER — BRIMONIDINE TARTRATE 0.2 % OP SOLN
1.0000 [drp] | Freq: Two times a day (BID) | OPHTHALMIC | Status: DC
  Administered 2019-09-28 – 2019-10-07 (×19): 1 [drp] via OPHTHALMIC
  Filled 2019-09-28 (×3): qty 5

## 2019-09-28 MED ORDER — VANCOMYCIN HCL IN DEXTROSE 1-5 GM/200ML-% IV SOLN
1000.0000 mg | Freq: Two times a day (BID) | INTRAVENOUS | Status: DC
  Administered 2019-09-28 – 2019-10-01 (×6): 1000 mg via INTRAVENOUS
  Filled 2019-09-28 (×8): qty 200

## 2019-09-28 MED ORDER — ONDANSETRON HCL 4 MG PO TABS: 4.0000 mg | ORAL_TABLET | Freq: Four times a day (QID) | ORAL | Status: DC | PRN

## 2019-09-28 MED ORDER — FENTANYL 12 MCG/HR TD PT72
1.0000 | MEDICATED_PATCH | TRANSDERMAL | Status: DC
  Administered 2019-09-28 – 2019-10-07 (×4): 1 via TRANSDERMAL
  Filled 2019-09-28 (×4): qty 1

## 2019-09-28 MED ORDER — OMEGA-3-ACID ETHYL ESTERS 1 G PO CAPS
1000.0000 mg | ORAL_CAPSULE | Freq: Every day | ORAL | Status: DC
  Administered 2019-09-28 – 2019-10-07 (×8): 1000 mg via ORAL
  Filled 2019-09-28 (×8): qty 1

## 2019-09-28 MED ORDER — UMECLIDINIUM BROMIDE 62.5 MCG/INH IN AEPB
1.0000 | INHALATION_SPRAY | Freq: Every day | RESPIRATORY_TRACT | Status: DC
  Administered 2019-09-28 – 2019-10-07 (×10): 1 via RESPIRATORY_TRACT
  Filled 2019-09-28 (×2): qty 7

## 2019-09-28 MED ORDER — DOCUSATE SODIUM 100 MG PO CAPS: 100.0000 mg | ORAL_CAPSULE | Freq: Every day | ORAL | Status: DC | PRN

## 2019-09-28 NOTE — Progress Notes (Signed)
Pharmacy Antibiotic Note  Timothy Juarez is a 69 y.o. male admitted on 09/27/2019 with cellulitis.  Pharmacy has been consulted for vanc/zosyn dosing. Patient received vanc 2g IV load and zosyn 3.375g IV x 1 in ED.  Plan: 02/10 @ 0230 renal function improved to around patient's baseline, therefore will readjust vanc regimen as follows:  Vancomycin 1000 mg IV Q 12 hrs. Goal AUC 400-550. Expected AUC: 465.9 SCr used: 1.14 (b/l 1.10, AKI improving) Cssmin: 13.9  Will continue zosyn 3.375g IV q8h and will continue to monitor and adjust as necessary since patient is in AKI will monitor closely.  Height: 6\' 1"  (185.4 cm) Weight: 210 lb (95.3 kg) IBW/kg (Calculated) : 79.9  Temp (24hrs), Avg:97.8 F (36.6 C), Min:97.5 F (36.4 C), Max:98.1 F (36.7 C)  Recent Labs  Lab 09/21/19 0908 09/27/19 2143 09/27/19 2346 09/28/19 0225  WBC  --  16.5*  --  13.6*  CREATININE 1.30* 1.26*  --  1.14  LATICACIDVEN  --  1.3 1.2  --     Estimated Creatinine Clearance: 70.1 mL/min (by C-G formula based on SCr of 1.14 mg/dL).    No Known Allergies   Thank you for allowing pharmacy to be a part of this patient's care.  Tobie Lords, PharmD, BCPS Clinical Pharmacist 09/28/2019 3:51 AM

## 2019-09-28 NOTE — ED Notes (Signed)
This RN changed pt undergarments and sheets which were bloody due to drainage from abscess. Pt states that he is comfortable now.

## 2019-09-28 NOTE — Progress Notes (Signed)
[place] [facility] [place] NAME: Timothy Juarez    MR#:  [id number]  DATE OF BIRTH:  22-Nov-1950  SUBJECTIVE:   Patient is a resident at [facility]. Comes in with bleeding from amputation stump. Found to have some fluid collection around the left amputation stump. Denies any fever. REVIEW OF SYSTEMS:   Review of Systems  Constitutional: Negative for chills, fever and weight loss.  HENT: Negative for ear discharge, ear pain and nosebleeds.   Eyes: Negative for blurred vision, pain and discharge.  Respiratory: Negative for sputum production, shortness of breath, wheezing and stridor.   Cardiovascular: Negative for chest pain, palpitations, orthopnea and PND.  Gastrointestinal: Negative for abdominal pain, diarrhea, nausea and vomiting.  Genitourinary: Negative for frequency and urgency.  Musculoskeletal: Negative for back pain and joint pain.       Pain and swelling around the left amputation stump  Neurological: Negative for sensory change, speech change, focal weakness and weakness.  Psychiatric/Behavioral: Negative for depression and hallucinations. The patient is not nervous/anxious.    Tolerating Diet:yes Tolerating PT:   DRUG ALLERGIES:  No Known Allergies  VITALS:  Blood pressure (!) 115/54, pulse 67, temperature 98.4 F (36.9 C), temperature source Oral, resp. rate 16, height 6\' 1"  (1.854 m), weight 95.3 kg, SpO2 99 %.  PHYSICAL EXAMINATION:   Physical Exam  GENERAL:  69 y.o.-year-old patient lying in the bed with no acute distress. Obese EYES: Pupils equal, round, reactive to light and accommodation. No scleral icterus.   HEENT: Head atraumatic, normocephalic. Oropharynx and nasopharynx clear.  NECK:  Supple, no jugular venous distention. No thyroid enlargement, no tenderness.  LUNGS: Normal breath sounds bilaterally, no wheezing, rales, rhonchi. No use of accessory muscles of respiration.  CARDIOVASCULAR: S1, S2 normal. No  murmurs, rubs, or gallops.  ABDOMEN: Soft, nontender, nondistended. Bowel sounds present. No organomegaly or mass.  EXTREMITIES: left amputation stump    NEUROLOGIC: Cranial nerves II through XII are intact. No focal Motor or sensory deficits b/l.   PSYCHIATRIC:  patient is alert and oriented x 3.  SKIN: No obvious rash, lesion, or ulcer.   LABORATORY PANEL:  CBC Recent Labs  Lab 09/28/19 0225  WBC 13.6*  HGB 11.2*  HCT 36.2*  PLT 413*    Chemistries  Recent Labs  Lab 09/27/19 2143 09/27/19 2143 09/28/19 0225  NA 138   < > 137  K 3.5   < > 3.2*  CL 103   < > 103  CO2 24   < > 25  GLUCOSE 111*   < > 112*  BUN 15   < > 14  CREATININE 1.26*   < > 1.14  CALCIUM 8.7*   < > 8.3*  AST 18  --   --   ALT 13  --   --   ALKPHOS 122  --   --   BILITOT 0.9  --   --    < > = values in this interval not displayed.   Cardiac Enzymes No results for input(s): TROPONINI in the last 168 hours. RADIOLOGY:  CT FEMUR LEFT W CONTRAST  Result Date: 09/27/2019 CLINICAL DATA:  Left thigh abscess. Bleeding from left above knee amputation site. EXAM: CT OF THE LOWER RIGHT EXTREMITY WITH CONTRAST TECHNIQUE: Multidetector CT imaging of the lower right extremity was performed according to the standard protocol following intravenous contrast administration. COMPARISON:  None. CONTRAST:  148mL OMNIPAQUE IOHEXOL 300 MG/ML  SOLN FINDINGS: Status  post left above knee amputation. There is a heterogeneous collection within the medial aspect of the left thigh soft tissues, adjacent to the distal remaining component of the femur. There is an internal AV fistula catheter. The collection measures 10.4 x 17.1 x 6.2 cm, previously 11.4 x 17.1 x 8.5 cm. There is surrounding inflammatory stranding. The collection extends to the medial thigh skin surface (series 7, image 193). No osteolysis or other bony irregularity of the left femur. IMPRESSION: 1. Decreased size of heterogeneous collection of the left thigh,  measuring 10.4 x 17.1 x 6.2 cm, likely a combination of hematoma and abscess. The collection extends to the medial thigh skin surface. 2. No osteolysis or other bony irregularity. Electronically Signed   By: Ulyses Jarred M.D.   On: 09/27/2019 23:02   DG Chest Port 1 View  Result Date: 09/27/2019 CLINICAL DATA:  Sepsis EXAM: PORTABLE CHEST 1 VIEW COMPARISON:  12/31/2018 FINDINGS: Heart is normal size. Areas of scarring in the lungs bilaterally. No acute confluent opacities or effusions. No acute bony abnormality. IMPRESSION: Chronic changes.  No acute cardiopulmonary disease. Electronically Signed   By: Rolm Baptise M.D.   On: 09/27/2019 22:14   ECHOCARDIOGRAM COMPLETE  Result Date: 09/28/2019    ECHOCARDIOGRAM REPORT   Patient Name:   Timothy Juarez Date of Exam: 09/28/2019 Medical Rec #:  [id number]         Height:       73.0 in Accession #:    [id number]        Weight:       210.0 lb Date of Birth:  02/11/1951         BSA:          2.20 m Patient Age:    69 years          BP:           117/56 mmHg Patient Gender: M                 HR:           67 bpm. Exam Location:  [facility] Procedure: 2D Echo, Cardiac Doppler and Color Doppler Indications:     CHF 428.0  History:         Patient has prior history of Echocardiogram examinations, most                  recent 10/11/2017. COPD; Risk Factors:Hypertension. PVD.  Sonographer:     JERRY Referring Phys:  [id number] Athena Masse Diagnosing Phys: Kate Sable MD  Sonographer Comments: Technically difficult study due to poor echo windows and no parasternal window. Image acquisition challenging due to COPD. IMPRESSIONS  1. Left ventricular ejection fraction, by estimation, is 60 to 65%. The left ventricle has normal function. The left ventrical has no regional wall motion abnormalities. Left ventricular diastolic function could not be evaluated.  2. Right ventricular systolic function was not well visualized. The right ventricular size is not well visualized.  3. No  left atrial/left atrial appendage thrombus was detected.  4. The mitral valve is grossly normal. no evidence of mitral valve regurgitation.  5. Tricuspid valve regurgitation not assessed.  6. The aortic valve is grossly normal. Aortic valve regurgitation is not visualized.  7. Pulmonic valve regurgitation not assessed. FINDINGS  Left Ventricle: Left ventricular ejection fraction, by estimation, is 60 to 65%. The left ventricle has normal function. The left ventricle has no regional wall motion abnormalities. The left ventricular internal cavity  size was normal in size. There is  no left ventricular hypertrophy. Left ventricular diastolic function could not be evaluated. Right Ventricle: The right ventricular size is not well visualized. Right vetricular wall thickness was not assessed. Right ventricular systolic function was not well visualized. Left Atrium: Left atrial size was normal in size. Right Atrium: Right atrial size was normal in size. Pericardium: There is no evidence of pericardial effusion. Mitral Valve: The mitral valve is grossly normal. No evidence of mitral valve regurgitation. Tricuspid Valve: The tricuspid valve is not well visualized. Tricuspid valve regurgitation not assessed. Aortic Valve: The aortic valve is grossly normal. Aortic valve regurgitation is not visualized. Aortic valve mean gradient measures 1.0 mmHg. Aortic valve peak gradient measures 2.6 mmHg. Aortic valve area, by VTI measures 6.68 cm. Pulmonic Valve: The pulmonic valve was not well visualized. Pulmonic valve regurgitation not assessed. Aorta: The aortic root was not well visualized and the aortic root is normal in size and structure. Venous: The inferior vena cava was not well visualized. IAS/Shunts: No atrial level shunt detected by color flow Doppler.  LEFT VENTRICLE PLAX 2D LVIDd:         2.26 cm  Diastology LVIDs:         1.65 cm  LV e' lateral: 6.64 cm/s LV PW:         1.00 cm  LV e' medial:  6.31 cm/s LV IVS:         1.12 cm LVOT diam:     2.00 cm LV SV:         84.19 ml LV SV Index:   4.29 LVOT Area:     3.14 cm  RIGHT VENTRICLE RV Basal diam:  3.78 cm TAPSE (M-mode): 3.8 cm LEFT ATRIUM           Index      RIGHT ATRIUM           Index LA diam:      2.90 cm 1.32 cm/m RA Area:     13.40 cm LA Vol (A4C): 15.4 ml 7.01 ml/m RA Volume:   33.00 ml  15.02 ml/m  AORTIC VALVE AV Area (Vmax):    3.50 cm AV Area (Vmean):   4.44 cm AV Area (VTI):     6.68 cm AV Vmax:           80.30 cm/s AV Vmean:          48.100 cm/s AV VTI:            0.126 m AV Peak Grad:      2.6 mmHg AV Mean Grad:      1.0 mmHg LVOT Vmax:         89.40 cm/s LVOT Vmean:        68.000 cm/s LVOT VTI:          0.268 m LVOT/AV VTI ratio: 2.13 TRICUSPID VALVE TR Peak grad:   9.7 mmHg TR Vmax:        156.00 cm/s  SHUNTS Systemic VTI:  0.27 m Systemic Diam: 2.00 cm Kate Sable MD Electronically signed by Kate Sable MD Signature Date/Time: 09/28/2019/3:41:28 PM    Final    ASSESSMENT AND PLAN:  Timothy Juarez is a 69 y.o. male with medical history significant for COPD, diastolic heart failure, hospitalized for Covid Pneumonia in May 2020, history of recurrent DVT on Eliquis, hypertension, with history of peripheral vascular disease with left SFA stent with history of left AKA, who was sent into the  emergency room by vascular surgeon Dr. Lucky Cowboy due to concern for abscess at the left AKA stump, probably related to old stent and SFA    Abscess of left AKA stump, with history of SFA stent left thigh -Continue vancomycin and Zosyn started in the emergency room -Dr. Lucky Cowboy consulted, plans to  take patient to the OR for drainage and stent removal tomorrow -follow further recommendations. -Lactic acid 1.3 -CT femur left shows heterogeneous collection of the left thigh, measuring 10.4 x 17.1 x 6.2 cm, likely a combination of hematoma and abscess. The collection extends to the medial thigh skin surface    AKI (acute kidney injury) ([place]) -Suspect  prerenal -IV hydration and monitor renal function -creat 1.26--1.1    PAD (peripheral artery disease) ([place]) -On aspirin and atorvastatin can be held while n.p.o.    Essential hypertension -Continue home meds when not n.p.o.    History of COVID-19 -No acute concerns.  Was hospitalized for COVID-19 pneumonia with hypoxia in May 2020    (HFpEF) heart failure with preserved ejection fraction ([place]) -EF 60 to 65% in February 2019 Follow echocardiogram -well compensated    COPD with chronic bronchitis ([place]) Not acutely exacerbated Continue home bronchodilator treatments.  DuoNebs as needed    Chronic anticoagulation with eliquis for history of DVT -Patient had suction of a large amount of blood from the cavity so will cover with subcu heparin instead of full bridge and resume po eliquis after surgery when ok with dr dew    DVT prophylaxis:  holding eliquis pending amputation stump surgery Code Status: full code  Family Communication: none  Disposition Plan: Back to previous SNF environment Consults called: Dr. Lucky Cowboy  Barriers to dishcarge: pending surgery  TOTAL TIME TAKING CARE OF THIS PATIENT: *30* minutes.  >50% time spent on counselling and coordination of care  Note: This dictation was prepared with Dragon dictation along with smaller phrase technology. Any transcriptional errors that result from this process are unintentional.  Fritzi Mandes M.D    [facility]   CC: Primary care physician; Alvester Morin, MDPatient ID: Timothy Juarez, male   DOB: 07-31-1951, 69 y.o.   MRN: [id number]

## 2019-09-28 NOTE — Progress Notes (Signed)
[facility] Daily Progress Note  Subjective: Patient without complaint this AM. No issues overnight.   Objective: Vitals:   09/27/19 2200 09/27/19 2257 09/28/19 0155 09/28/19 0832  BP: 124/64  135/72 (!) 117/56  Pulse: 82 75 82 67  Resp: 12 (!) 21 16   Temp:   (!) 97.5 F (36.4 C) 98.1 F (36.7 C)  TempSrc:   Oral Oral  SpO2: 93% 93% 97% 97%  Weight:      Height:        Intake/Output Summary (Last 24 hours) at 09/28/2019 1300 Last data filed at 09/28/2019 1100 Gross per 24 hour  Intake 346.66 ml  Output --  Net 346.66 ml   Physical Exam: A&Ox3, NAD CV: RRR Pulmonary: CTA Bilaterally Abdomen: Soft, Nontender, Nondistended Vascular:  Left Lower Extremity: Moderate edema. Tender to palpation.    Laboratory: CBC    Component Value Date/Time   WBC 13.6 (H) 09/28/2019 0225   HGB 11.2 (L) 09/28/2019 0225   HGB 14.3 12/01/2013 1317   HCT 36.2 (L) 09/28/2019 0225   HCT 43.9 12/01/2013 1317   PLT 413 (H) 09/28/2019 0225   PLT 250 12/01/2013 1317   BMET    Component Value Date/Time   NA 137 09/28/2019 0225   NA 136 12/01/2013 1317   K 3.2 (L) 09/28/2019 0225   K 3.9 12/01/2013 1317   CL 103 09/28/2019 0225   CL 100 12/01/2013 1317   CO2 25 09/28/2019 0225   CO2 33 (H) 12/01/2013 1317   GLUCOSE 112 (H) 09/28/2019 0225   GLUCOSE 91 12/01/2013 1317   BUN 14 09/28/2019 0225   BUN 24 (H) 09/25/2014 0846   CREATININE 1.14 09/28/2019 0225   CREATININE 1.29 09/25/2014 0846   CALCIUM 8.3 (L) 09/28/2019 0225   CALCIUM 9.4 12/01/2013 1317   GFRNONAA >60 09/28/2019 0225   GFRNONAA 60 (L) 09/25/2014 0846   GFRNONAA >60 03/16/2014 0750   GFRAA >60 09/28/2019 0225   GFRAA >60 09/25/2014 0846   GFRAA >60 03/16/2014 0750   Assessment/Planning: The patient is a 69 year old male with known extensive abscess in the left thigh most likely from old Viabahn stents in the SFA.  1) Plan is to drain abscess and remove stents tomorrow in the operating  room. 2) Patient continues to wishes to proceed with surgery.  Procedure discussed again.  All questions answered. 3) Will preop  Discussed with Dr. Ellis Parents Branon Sabine PA-C 09/28/2019 1:00 PM

## 2019-09-28 NOTE — Progress Notes (Signed)
ANTICOAGULATION CONSULT NOTE - Initial Consult  Pharmacy Consult for heparin Indication: atrial fibrillation  No Known Allergies  Patient Measurements: Height: 6\' 1"  (185.4 cm) Weight: 210 lb (95.3 kg) IBW/kg (Calculated) : 79.9  Vital Signs: Temp: 98.1 F (36.7 C) (02/09 2150) Temp Source: Oral (02/09 2150) BP: 124/64 (02/09 2200) Pulse Rate: 75 (02/09 2257)  Labs: Recent Labs    09/27/19 2143  HGB 12.3*  HCT 39.6  PLT 452*  LABPROT 14.9  INR 1.2  CREATININE 1.26*    Estimated Creatinine Clearance: 63.4 mL/min (A) (by C-G formula based on SCr of 1.26 mg/dL (H)).   Medical History: Past Medical History:  Diagnosis Date  . Acute embolism and thombos unsp deep vn unsp lower extremity ([place])   . Acute respiratory failure ([place])   . Allergy   . Anemia   . Aneurysm of unspecified site ([place])   . ARF (acute respiratory failure) ([place])    H/O  . Bronchitis   . CHF (congestive heart failure) ([place])   . COPD (chronic obstructive pulmonary disease) ([place])   . Cough   . Epistaxis   . GERD (gastroesophageal reflux disease)   . Gout   . Hyperlipidemia   . Hypertension   . Hypokalemia   . Insomnia   . Muscle contracture    MUSCLE SPASMS, muscle weakness  . Peripheral vascular disease ([place])   . Pneumonia   . Pressure ulcer     Medications:  Scheduled:    Assessment: Patient has an abscess from a BKA s/t PVD and is bleeding from that site as well. Patient has a h/o afib on eliquis PTA, this is being held and patient was ordered a heparin drip for inpatient anticoagulation in anticipation of surgery; however, patient is also bleeding uncontrollably from abscess site, his hgb/hct on arrival is 12.3/39.6 which is down from baseline of 14/45. Heparin is currently being held in light of bleeding, hospitalist currently to assess patient. CBC low from baseline, INR WNL, aPTT and anti-Xa pending.  Goal of Therapy:  Heparin level 0.3-0.7 units/ml Monitor platelets by  anticoagulation protocol: Yes   Plan:  No bolus as last eliquis dose was 02/09 at 0930 and patient is also bleeding from wound site. Will plan to start a rate of 1100 units/hr if deemed safe to start anticoagulation. APTT is scheduled for 0700, this may have to be rescheduled depending when and if heparin drip is started. Will continue to monitor clinical scenario.  Tobie Lords, PharmD, BCPS Clinical Pharmacist 09/28/2019,1:55 AM

## 2019-09-28 NOTE — Progress Notes (Signed)
Pharmacy Antibiotic Note  Timothy Juarez is a 69 y.o. male admitted on 09/27/2019 with cellulitis.  Pharmacy has been consulted for vanc/zosyn dosing. Patient received vanc 2g IV load and zosyn 3.375g IV x 1 in ED.  Plan: Vancomycin 2000 mg IV Q 24 hrs. Goal AUC 400-550. Expected AUC: 511.2 SCr used: 1.26 (b/l 1.10, in AKI) Cssmin: 11.2  Will continue zosyn 3.375g IV q8h and will continue to monitor and adjust as necessary since patient is in AKI will monitor closely.  Height: 6\' 1"  (185.4 cm) Weight: 210 lb (95.3 kg) IBW/kg (Calculated) : 79.9  Temp (24hrs), Avg:98.1 F (36.7 C), Min:98.1 F (36.7 C), Max:98.1 F (36.7 C)  Recent Labs  Lab 09/21/19 0908 09/27/19 2143 09/27/19 2346  WBC  --  16.5*  --   CREATININE 1.30* 1.26*  --   LATICACIDVEN  --  1.3 1.2    Estimated Creatinine Clearance: 63.4 mL/min (A) (by C-G formula based on SCr of 1.26 mg/dL (H)).    No Known Allergies   Thank you for allowing pharmacy to be a part of this patient's care.  Tobie Lords, PharmD, BCPS Clinical Pharmacist 09/28/2019 1:54 AM

## 2019-09-28 NOTE — Progress Notes (Signed)
ANTICOAGULATION CONSULT NOTE - Initial Consult  Pharmacy Consult for heparin Indication: atrial fibrillation  No Known Allergies  Patient Measurements: Height: 6\' 1"  (185.4 cm) Weight: 210 lb (95.3 kg) IBW/kg (Calculated) : 79.9  Vital Signs: Temp: 97.5 F (36.4 C) (02/10 0155) Temp Source: Oral (02/10 0155) BP: 135/72 (02/10 0155) Pulse Rate: 82 (02/10 0155)  Labs: Recent Labs    09/27/19 2143 09/28/19 0225  HGB 12.3* 11.2*  HCT 39.6 36.2*  PLT 452* 413*  APTT  --  40*  LABPROT 14.9  --   INR 1.2  --   HEPARINUNFRC  --  1.61*  CREATININE 1.26* 1.14    Estimated Creatinine Clearance: 70.1 mL/min (by C-G formula based on SCr of 1.14 mg/dL).   Medical History: Past Medical History:  Diagnosis Date  . Acute embolism and thombos unsp deep vn unsp lower extremity ([place])   . Acute respiratory failure ([place])   . Allergy   . Anemia   . Aneurysm of unspecified site ([place])   . ARF (acute respiratory failure) ([place])    H/O  . Bronchitis   . CHF (congestive heart failure) ([place])   . COPD (chronic obstructive pulmonary disease) ([place])   . Cough   . Epistaxis   . GERD (gastroesophageal reflux disease)   . Gout   . Hyperlipidemia   . Hypertension   . Hypokalemia   . Insomnia   . Muscle contracture    MUSCLE SPASMS, muscle weakness  . Peripheral vascular disease ([place])   . Pneumonia   . Pressure ulcer     Medications:  Scheduled:  . heparin injection (subcutaneous)  5,000 Units Subcutaneous Q8H    Assessment: Patient has an abscess from a BKA s/t PVD and is bleeding from that site as well. Patient has a h/o afib on eliquis PTA, this is being held and patient was ordered a heparin drip for inpatient anticoagulation in anticipation of surgery; however, patient is also bleeding uncontrollably from abscess site, his hgb/hct on arrival is 12.3/39.6 which is down from baseline of 14/45. Heparin is currently being held in light of bleeding, hospitalist currently to assess  patient. CBC low from baseline, INR WNL, aPTT and anti-Xa pending.  Goal of Therapy:  Heparin level 0.3-0.7 units/ml Monitor platelets by anticoagulation protocol: Yes   Plan:  02/10 @ 0300 Spoke w/ hospitalist and decided to place patient on VTE prophylaxis w/ heparin 5000 units subcutaneous q8h for now. Will continue to monitor.  Tobie Lords, PharmD, BCPS Clinical Pharmacist 09/28/2019,4:27 AM

## 2019-09-28 NOTE — H&P (Signed)
History and Physical    Timothy Juarez:[id number] DOB: 13-Aug-1951 DOA: 09/27/2019  PCP: Alvester Morin, MD   Patient coming from: [place] have personally briefly reviewed patient's old medical records in [place]  Chief Complaint: pain and oozing left AKA stump, sent by Dr. Lucky Cowboy  HPI: Timothy Juarez is a 69 y.o. male with medical history significant for COPD, diastolic heart failure, hospitalized for Covid Pneumonia in May 2020, history of recurrent DVT on Eliquis, hypertension, with history of peripheral vascular disease with left SFA stent with history of left AKA, who was sent into the emergency room by vascular surgeon Dr. Lucky Cowboy due to concern for abscess at the left AKA stump, probably related to old stent and SFA.  Dr. Lucky Cowboy plans to take patient to the OR for drainage and for removal of the stent, per review of his note.  Patient has been having increasing pain of the left thigh and then started having oozing from the area.  He also reports feeling occasional chills.  Denies other complaints  ED Course: On arrival in the emergency room he was afebrile at 98.1, BP 142/77, HR 98, RR 17 and O2 sat 94% on room air.  On his blood work he had a white cell count of 16,500, hemoglobin 12.3, creatinine 1.26.  Baseline creatinine 1.07 in May 2020.  He had a CT of the left femur that showed fluid collection in the left thigh 10.4 x 17.1 x 8.2 that was leaking to the medial thigh possible abscess plus hematoma.  While in the emergency room they were able to suction out the amount of pus and blood from the abscess.  At the time of my evaluation, very minimal drainage.  Review of Systems: As per HPI otherwise 10 point review of systems negative.    Past Medical History:  Diagnosis Date  . Acute embolism and thombos unsp deep vn unsp lower extremity ([place])   . Acute respiratory failure ([place])   . Allergy   . Anemia   . Aneurysm of unspecified site ([place])   . ARF (acute  respiratory failure) ([place])    H/O  . Bronchitis   . CHF (congestive heart failure) ([place])   . COPD (chronic obstructive pulmonary disease) ([place])   . Cough   . Epistaxis   . GERD (gastroesophageal reflux disease)   . Gout   . Hyperlipidemia   . Hypertension   . Hypokalemia   . Insomnia   . Muscle contracture    MUSCLE SPASMS, muscle weakness  . Peripheral vascular disease ([place])   . Pneumonia   . Pressure ulcer     Past Surgical History:  Procedure Laterality Date  . AMPUTATION Left 09/19/2015   Procedure: AMPUTATION BELOW KNEE;  Surgeon: Algernon Huxley, MD;  Location: [facility] ORS;  Service: Vascular;  Laterality: Left;  . AMPUTATION Left 11/01/2015   Procedure: AMPUTATION ABOVE KNEE;  Surgeon: Algernon Huxley, MD;  Location: [facility] ORS;  Service: Vascular;  Laterality: Left;  . APPLICATION OF WOUND VAC Left 10/18/2015   Procedure: APPLICATION OF WOUND VAC;  Surgeon: Algernon Huxley, MD;  Location: [facility] ORS;  Service: Vascular;  Laterality: Left;  . CATARACT EXTRACTION W/PHACO Right 08/18/2019   Procedure: CATARACT EXTRACTION PHACO AND INTRAOCULAR LENS PLACEMENT ([place]) RIGHT;  Surgeon: Birder Robson, MD;  Location: [facility] ORS;  Service: Ophthalmology;  Laterality: Right;  [place] 03:29.0 CDE 45.68 Fluid Pack Lot # [id number]  . COLONOSCOPY WITH PROPOFOL N/A 05/25/2017  Procedure: COLONOSCOPY WITH PROPOFOL;  Surgeon: Jonathon Bellows, MD;  Location: [facility] ENDOSCOPY;  Service: Gastroenterology;  Laterality: N/A;  . COLONOSCOPY WITH PROPOFOL N/A 10/14/2017   Procedure: COLONOSCOPY WITH PROPOFOL;  Surgeon: Jonathon Bellows, MD;  Location: [facility] ENDOSCOPY;  Service: Gastroenterology;  Laterality: N/A;  . ESOPHAGOGASTRODUODENOSCOPY (EGD) WITH PROPOFOL N/A 05/25/2017   Procedure: ESOPHAGOGASTRODUODENOSCOPY (EGD) WITH PROPOFOL;  Surgeon: Jonathon Bellows, MD;  Location: [facility] ENDOSCOPY;  Service: Gastroenterology;  Laterality: N/A;  . ESOPHAGOGASTRODUODENOSCOPY (EGD) WITH PROPOFOL N/A 10/14/2017   Procedure: ESOPHAGOGASTRODUODENOSCOPY  (EGD) WITH PROPOFOL;  Surgeon: Jonathon Bellows, MD;  Location: [facility] ENDOSCOPY;  Service: Gastroenterology;  Laterality: N/A;  . EYE SURGERY    . GIVENS CAPSULE STUDY N/A 07/14/2017   Procedure: GIVENS CAPSULE STUDY;  Surgeon: Jonathon Bellows, MD;  Location: [facility] ENDOSCOPY;  Service: Gastroenterology;  Laterality: N/A;  . GIVENS CAPSULE STUDY N/A 10/30/2017   Procedure: GIVENS CAPSULE STUDY 12 HR;  Surgeon: Jonathon Bellows, MD;  Location: [facility] ENDOSCOPY;  Service: Gastroenterology;  Laterality: N/A;  . IVC FILTER INSERTION N/A 10/12/2017   Procedure: IVC FILTER INSERTION;  Surgeon: Algernon Huxley, MD;  Location: [place] CV LAB;  Service: Cardiovascular;  Laterality: N/A;  . LOWER EXTREMITY ANGIOGRAPHY Right 10/04/2018   Procedure: LOWER EXTREMITY ANGIOGRAPHY;  Surgeon: Algernon Huxley, MD;  Location: [place] CV LAB;  Service: Cardiovascular;  Laterality: Right;  . PERIPHERAL VASCULAR CATHETERIZATION Left 01/18/2015   Procedure: Lower Extremity Angiography;  Surgeon: Algernon Huxley, MD;  Location: [place] CV LAB;  Service: Cardiovascular;  Laterality: Left;  . PERIPHERAL VASCULAR CATHETERIZATION N/A 01/18/2015   Procedure: Lower Extremity Intervention;  Surgeon: Algernon Huxley, MD;  Location: [place] CV LAB;  Service: Cardiovascular;  Laterality: N/A;  . PERIPHERAL VASCULAR CATHETERIZATION  07/30/2015   Procedure: Lower Extremity Intervention;  Surgeon: Algernon Huxley, MD;  Location: [place] CV LAB;  Service: Cardiovascular;;  . PERIPHERAL VASCULAR CATHETERIZATION N/A 07/30/2015   Procedure: Abdominal Aortogram w/Lower Extremity;  Surgeon: Algernon Huxley, MD;  Location: [place] CV LAB;  Service: Cardiovascular;  Laterality: N/A;  . PERIPHERAL VASCULAR CATHETERIZATION Left 08/22/2015   Procedure: Lower Extremity Angiography;  Surgeon: Algernon Huxley, MD;  Location: [place] CV LAB;  Service: Cardiovascular;  Laterality: Left;  . PERIPHERAL VASCULAR CATHETERIZATION Left 08/22/2015   Procedure: Lower  Extremity Intervention;  Surgeon: Algernon Huxley, MD;  Location: [place] CV LAB;  Service: Cardiovascular;  Laterality: Left;  . PERIPHERAL VASCULAR CATHETERIZATION Right 03/31/2016   Procedure: Lower Extremity Angiography;  Surgeon: Algernon Huxley, MD;  Location: [place] CV LAB;  Service: Cardiovascular;  Laterality: Right;  . PERIPHERAL VASCULAR CATHETERIZATION  03/31/2016   Procedure: Lower Extremity Intervention;  Surgeon: Algernon Huxley, MD;  Location: [place] CV LAB;  Service: Cardiovascular;;  . PERIPHERAL VASCULAR CATHETERIZATION Left 04/10/2016   Procedure: Lower Extremity Angiography;  Surgeon: Algernon Huxley, MD;  Location: [place] CV LAB;  Service: Cardiovascular;  Laterality: Left;  . WOUND DEBRIDEMENT Left 10/18/2015   Procedure: DEBRIDEMENT WOUND   ( LEFT BKA DEBRIDEMENT );  Surgeon: Algernon Huxley, MD;  Location: [facility] ORS;  Service: Vascular;  Laterality: Left;     reports that he quit smoking about 6 years ago. His smoking use included cigarettes. He has a 44.00 pack-year smoking history. He has never used smokeless tobacco. He reports that he does not drink alcohol or use drugs.  No Known Allergies  Family History  Problem Relation Age of Onset  .  Heart attack Mother   . Varicose Veins Neg Hx      Prior to Admission medications   Medication Sig Start Date End Date Taking? Authorizing Provider  acetaminophen (TYLENOL) 325 MG tablet Take 650 mg by mouth every 4 (four) hours as needed for fever.    Yes [provider]  acidophilus (RISAQUAD) CAPS capsule Take 1 capsule by mouth 2 (two) times daily.   Yes [provider]  ammonium lactate (AMLACTIN) 12 % cream Apply topically 2 (two) times daily. Apply to right foot   Yes [provider]  amoxicillin-clavulanate (AUGMENTIN) 500-125 MG tablet Take 1 tablet by mouth 2 (two) times daily. 09/23/19 09/29/19 Yes [provider]  aspirin EC 81 MG tablet Take 1 tablet (81 mg total) by mouth  daily. 10/04/18  Yes Dew, Erskine Squibb, MD  atorvastatin (LIPITOR) 10 MG tablet Take 1 tablet (10 mg total) by mouth daily. Patient taking differently: Take 10 mg by mouth at bedtime.  10/04/18 10/04/19 Yes Dew, Erskine Squibb, MD  baclofen (LIORESAL) 10 MG tablet Take 10 mg by mouth 3 (three) times daily. Hold for sedation   Yes [provider]  brimonidine-timolol (COMBIGAN) 0.2-0.5 % ophthalmic solution Place 1 drop into the right eye 2 (two) times daily.   Yes [provider]  ciclopirox (PENLAC) 8 % solution Apply 1 application topically daily. Apply solutions to toenails   Yes [provider]  Difluprednate (DUREZOL) 0.05 % EMUL Place 1 drop into the right eye 3 (three) times daily.    Yes [provider]  docusate sodium (COLACE) 100 MG capsule Take 100 mg by mouth daily as needed for mild constipation.   Yes [provider]  ELIQUIS 5 MG TABS tablet Take 5 mg by mouth 2 (two) times daily.  07/01/18  Yes [provider]  fentaNYL (DURAGESIC) 12 MCG/HR Place 1 patch onto the skin every 3 (three) days. 01/04/19  Yes Patrecia Pour, MD  ferrous sulfate 324 (65 Fe) MG TBEC Take 324 mg by mouth 2 (two) times daily.    Yes [provider]  furosemide (LASIX) 40 MG tablet Take 1 tablet (40 mg total) by mouth daily. 10/15/17  Yes Wieting, Richard, MD  gabapentin (NEURONTIN) 100 MG capsule Take 100 mg by mouth at bedtime.    Yes [provider]  guaiFENesin (ROBITUSSIN) 100 MG/5ML SOLN Take 15 mLs by mouth 3 (three) times daily as needed for cough (congestion).    Yes [provider]  loperamide (IMODIUM) 2 MG capsule Take 2 mg by mouth 3 (three) times daily as needed for diarrhea or loose stools.   Yes [provider]  Melatonin 5 MG TABS Take 5 mg by mouth at bedtime as needed (insomnia).    Yes [provider]  nitroGLYCERIN (NITROSTAT) 0.4 MG SL tablet Place 0.14 mg under the tongue every 5 (five) minutes as needed for  chest pain.   Yes [provider]  Omega-3 Fatty Acids (FISH OIL) 1000 MG CAPS Take 1,000 mg by mouth daily.    Yes [provider]  Oxycodone HCl 10 MG TABS Take 10 mg by mouth every 8 (eight) hours as needed (moderate to severe pain).   Yes [provider]  oxymetazoline (AFRIN) 0.05 % nasal spray Place 2 sprays into both nostrils 2 (two) times daily as needed (For nose bleeds).    Yes [provider]  potassium chloride SA (K-DUR,KLOR-CON) 20 MEQ tablet Take 20 mEq by mouth daily.  With or after meal 10/15/17  Yes [provider]  sodium chloride (OCEAN) 0.65 % SOLN nasal spray Place 2 sprays into both nostrils 2 (two) times daily as needed for congestion.   Yes [provider]  TRELEGY ELLIPTA 100-62.5-25 MCG/INH AEPB Inhale 1 puff into the lungs every morning.  09/24/18  Yes [provider]    Physical Exam: Vitals:   09/27/19 2141 09/27/19 2150 09/27/19 2200 09/27/19 2257  BP:  (!) 142/77 124/64   Pulse:  98 82 75  Resp:  17 12 (!) 21  Temp:  98.1 F (36.7 C)    TempSrc:  Oral    SpO2:  94% 93% 93%  Weight: 95.3 kg     Height: 6\' 1"  (1.854 m)        Vitals:   09/27/19 2141 09/27/19 2150 09/27/19 2200 09/27/19 2257  BP:  (!) 142/77 124/64   Pulse:  98 82 75  Resp:  17 12 (!) 21  Temp:  98.1 F (36.7 C)    TempSrc:  Oral    SpO2:  94% 93% 93%  Weight: 95.3 kg     Height: 6\' 1"  (1.854 m)       Constitutional: NAD, alert and oriented x 3 Eyes: PERRL, lids and conjunctivae normal ENMT: Mucous membranes are moist.  Neck: normal, supple, no masses, no thyromegaly Respiratory: clear to auscultation bilaterally, no wheezing, no crackles. Normal respiratory effort. No accessory muscle use.  Cardiovascular: Regular rate and rhythm, no murmurs / rubs / gallops. No extremity edema. 2+ pedal pulses. No carotid bruits.  Abdomen: no tenderness, no masses palpated. No hepatosplenomegaly. Bowel sounds positive.    Musculoskeletal: Left AKA.  Swelling and warmth medial left thigh with open ulcer about 2 cm diameter draining minimal amount of blood .surrounding area red tender swollen . skin:  Swelling and warmth medial left thigh with open ulcer about 2 cm diameter draining minimal amount of blood .surrounding area red tender swollen   Neurologic: No gross focal neurologic deficit.Psychiatric: Normal mood and affect.   Labs on Admission: I have personally reviewed following labs and imaging studies  CBC: Recent Labs  Lab 09/27/19 2143  WBC 16.5*  NEUTROABS 12.5*  HGB 12.3*  HCT 39.6  MCV 75.7*  PLT 283*   Basic Metabolic Panel: Recent Labs  Lab 09/21/19 0908 09/27/19 2143  NA  --  138  K  --  3.5  CL  --  103  CO2  --  24  GLUCOSE  --  111*  BUN  --  15  CREATININE 1.30* 1.26*  CALCIUM  --  8.7*   GFR: Estimated Creatinine Clearance: 63.4 mL/min (A) (by C-G formula based on SCr of 1.26 mg/dL (H)). Liver Function Tests: Recent Labs  Lab 09/27/19 2143  AST 18  ALT 13  ALKPHOS 122  BILITOT 0.9  PROT 7.6  ALBUMIN 2.8*   No results for input(s): LIPASE, AMYLASE in the last 168 hours. No results for input(s): AMMONIA in the last 168 hours. Coagulation Profile: Recent Labs  Lab 09/27/19 2143  INR 1.2   Cardiac Enzymes: No results for input(s): CKTOTAL, CKMB, CKMBINDEX, TROPONINI in the last 168 hours. BNP (last 3 results) No results for input(s): PROBNP in the last 8760 hours. HbA1C: No results for input(s): HGBA1C in the last 72 hours. CBG: No results for input(s): GLUCAP in the last 168 hours. Lipid Profile: No results for input(s): CHOL, HDL, LDLCALC, TRIG, CHOLHDL, LDLDIRECT in the last 72 hours.  Thyroid Function Tests: No results for input(s): TSH, T4TOTAL, FREET4, T3FREE, THYROIDAB in the last 72 hours. Anemia Panel: No results for input(s): VITAMINB12, FOLATE, FERRITIN, TIBC, IRON, RETICCTPCT in the last 72 hours. Urine analysis:    Component Value  Date/Time   COLORURINE AMBER (A) 12/27/2018 1045   APPEARANCEUR HAZY (A) 12/27/2018 1045   APPEARANCEUR Clear 11/29/2012 1529   LABSPEC 1.016 12/27/2018 1045   LABSPEC 1.017 11/29/2012 1529   PHURINE 5.0 12/27/2018 1045   GLUCOSEU NEGATIVE 12/27/2018 1045   GLUCOSEU Negative 11/29/2012 1529   HGBUR SMALL (A) 12/27/2018 1045   BILIRUBINUR NEGATIVE 12/27/2018 1045   BILIRUBINUR Negative 11/29/2012 [place] 12/27/2018 1045   PROTEINUR 30 (A) 12/27/2018 1045   NITRITE NEGATIVE 12/27/2018 [place] 12/27/2018 1045   LEUKOCYTESUR 1+ 11/29/2012 1529    Radiological Exams on Admission: CT FEMUR LEFT W CONTRAST  Result Date: 09/27/2019 CLINICAL DATA:  Left thigh abscess. Bleeding from left above knee amputation site. EXAM: CT OF THE LOWER RIGHT EXTREMITY WITH CONTRAST TECHNIQUE: Multidetector CT imaging of the lower right extremity was performed according to the standard protocol following intravenous contrast administration. COMPARISON:  None. CONTRAST:  169mL OMNIPAQUE IOHEXOL 300 MG/ML  SOLN FINDINGS: Status post left above knee amputation. There is a heterogeneous collection within the medial aspect of the left thigh soft tissues, adjacent to the distal remaining component of the femur. There is an internal AV fistula catheter. The collection measures 10.4 x 17.1 x 6.2 cm, previously 11.4 x 17.1 x 8.5 cm. There is surrounding inflammatory stranding. The collection extends to the medial thigh skin surface (series 7, image 193). No osteolysis or other bony irregularity of the left femur. IMPRESSION: 1. Decreased size of heterogeneous collection of the left thigh, measuring 10.4 x 17.1 x 6.2 cm, likely a combination of hematoma and abscess. The collection extends to the medial thigh skin surface. 2. No osteolysis or other bony irregularity. Electronically Signed   By: Ulyses Jarred M.D.   On: 09/27/2019 23:02   DG Chest Port 1 View  Result Date: 09/27/2019 CLINICAL  DATA:  Sepsis EXAM: PORTABLE CHEST 1 VIEW COMPARISON:  12/31/2018 FINDINGS: Heart is normal size. Areas of scarring in the lungs bilaterally. No acute confluent opacities or effusions. No acute bony abnormality. IMPRESSION: Chronic changes.  No acute cardiopulmonary disease. Electronically Signed   By: Rolm Baptise M.D.   On: 09/27/2019 22:14    EKG: Independently reviewed.  Assessment/Plan    Abscess of left AKA stump, with history of SFA stent left thigh -Continue vancomycin and Zosyn started in the emergency room -Dr. Lucky Cowboy consulted, may take patient to the OR for drainage and stent removal -Wound care    AKI (acute kidney injury) ([place]) -Suspect prerenal -IV hydration and monitor renal function    Preoperative clearance -Patient will be bridged with heparin.  She is on chronic Eliquis for recurrent DVT -Given history of diastolic heart failure, peripheral vascular disease to without known coronary artery disease consideration can be given to obtaining preoperative cardiac clearance for surgery while awaiting Eliquis washout -Echocardiogram in the a.m. -Keep n.p.o. until further plans known for surgery    PAD (peripheral artery disease) ([place]) -On aspirin and atorvastatin can be held while n.p.o.    Essential hypertension -Continue home meds when not n.p.o.    History of COVID-19 -No acute concerns.  Was hospitalized for COVID-19 pneumonia with hypoxia in May 2020    (HFpEF) heart failure with preserved  ejection fraction (HCC) -EF 60 to 65% in February 2019 Follow echocardiogram    COPD with chronic bronchitis ([place]) Not acutely exacerbated Continue home bronchodilator treatments.  DuoNebs as needed    Chronic anticoagulation with eliquis for history of DVT -Patient had suction of a large amount of blood from the cavity so will cover with subcu heparin instead of full bridge      DVT prophylaxis: heparin bridge Code Status: full code  Family Communication: none   Disposition Plan: Back to previous home environment Consults called: Dr. Bonney Roussel MD [facility]     09/28/2019, 12:11 AM

## 2019-09-28 NOTE — Anesthesia Preprocedure Evaluation (Addendum)
Anesthesia Evaluation  Patient identified by MRN, date of birth, ID band Patient awake    Reviewed: Allergy & Precautions, H&P , NPO status , Patient's Chart, lab work & pertinent test results, reviewed documented beta blocker date and time   Airway Mallampati: II  TM Distance: >3 FB Neck ROM: full    Dental  (+) Poor Dentition, Chipped, Missing Very poor dentiton:   Pulmonary neg pulmonary ROS, pneumonia, COPD, former smoker,    Pulmonary exam normal        Cardiovascular Exercise Tolerance: Poor hypertension, On Medications + Peripheral Vascular Disease and +CHF  negative cardio ROS Normal cardiovascular exam Rate:Normal  2019 ECHO Study Conclusions  - Left ventricle: The cavity size was normal. Systolic function was   normal. The estimated ejection fraction was in the range of 60%   to 65%. Wall motion was normal; there were no regional wall   motion abnormalities. Doppler parameters are consistent with   abnormal left ventricular relaxation (grade 1 diastolic   dysfunction). - Left atrium: The atrium was normal in size. - Right ventricle: Systolic function was normal. - Pulmonary arteries: Systolic pressure was mildly elevated. PA   peak pressure: 42 mm Hg (S).   Neuro/Psych  Neuromuscular disease negative neurological ROS  negative psych ROS   GI/Hepatic negative GI ROS, Neg liver ROS, GERD  Medicated and Controlled,  Endo/Other  negative endocrine ROS  Renal/GU Renal diseasenegative Renal ROS  negative genitourinary   Musculoskeletal   Abdominal   Peds  Hematology negative hematology ROS (+) Blood dyscrasia, anemia ,   Anesthesia Other Findings Past Medical History: No date: Acute embolism and thombos unsp deep vn unsp lower extremity  (HCC) No date: Acute respiratory failure (HCC) No date: Allergy No date: Anemia No date: Aneurysm of unspecified site [facility]) No date: ARF (acute respiratory failure)  (HCC)     Comment:  H/O No date: Bronchitis No date: CHF (congestive heart failure) (HCC) No date: COPD (chronic obstructive pulmonary disease) (HCC) No date: Cough No date: Epistaxis No date: GERD (gastroesophageal reflux disease) No date: Gout No date: Hyperlipidemia No date: Hypertension No date: Hypokalemia No date: Insomnia No date: Muscle contracture     Comment:  MUSCLE SPASMS, muscle weakness No date: Peripheral vascular disease (HCC) No date: Pneumonia No date: Pressure ulcer   Reproductive/Obstetrics negative OB ROS                           Anesthesia Physical Anesthesia Plan  ASA: III  Anesthesia Plan: General LMA   Post-op Pain Management:    Induction:   PONV Risk Score and Plan:   Airway Management Planned:   Additional Equipment:   Intra-op Plan:   Post-operative Plan:   Informed Consent: I have reviewed the patients History and Physical, chart, labs and discussed the procedure including the risks, benefits and alternatives for the proposed anesthesia with the patient or authorized representative who has indicated his/her understanding and acceptance.       Plan Discussed with: CRNA  Anesthesia Plan Comments:         Anesthesia Quick Evaluation

## 2019-09-28 NOTE — Progress Notes (Signed)
*  PRELIMINARY RESULTS* Echocardiogram 2D Echocardiogram has been performed.  Timothy Juarez 09/28/2019, 11:18 AM

## 2019-09-29 ENCOUNTER — Encounter: Payer: Self-pay | Admitting: Internal Medicine

## 2019-09-29 ENCOUNTER — Inpatient Hospital Stay: Payer: Medicare Other | Admitting: Anesthesiology

## 2019-09-29 ENCOUNTER — Encounter
Admission: EM | Disposition: A | Discharge status: Skilled Nursing Facility | Payer: Self-pay | Source: Home / Self Care | Attending: Internal Medicine

## 2019-09-29 DIAGNOSIS — N179 Acute kidney failure, unspecified: Secondary | ICD-10-CM | POA: Diagnosis not present

## 2019-09-29 DIAGNOSIS — I959 Hypotension, unspecified: Secondary | ICD-10-CM | POA: Diagnosis not present

## 2019-09-29 DIAGNOSIS — Z95820 Peripheral vascular angioplasty status with implants and grafts: Secondary | ICD-10-CM

## 2019-09-29 DIAGNOSIS — D62 Acute posthemorrhagic anemia: Secondary | ICD-10-CM

## 2019-09-29 DIAGNOSIS — Z87891 Personal history of nicotine dependence: Secondary | ICD-10-CM

## 2019-09-29 DIAGNOSIS — K0889 Other specified disorders of teeth and supporting structures: Secondary | ICD-10-CM

## 2019-09-29 DIAGNOSIS — I5030 Unspecified diastolic (congestive) heart failure: Secondary | ICD-10-CM | POA: Diagnosis not present

## 2019-09-29 DIAGNOSIS — J449 Chronic obstructive pulmonary disease, unspecified: Secondary | ICD-10-CM | POA: Diagnosis not present

## 2019-09-29 DIAGNOSIS — L02416 Cutaneous abscess of left lower limb: Secondary | ICD-10-CM

## 2019-09-29 DIAGNOSIS — L0291 Cutaneous abscess, unspecified: Secondary | ICD-10-CM | POA: Diagnosis not present

## 2019-09-29 DIAGNOSIS — T8744 Infection of amputation stump, left lower extremity: Secondary | ICD-10-CM | POA: Diagnosis not present

## 2019-09-29 DIAGNOSIS — I739 Peripheral vascular disease, unspecified: Secondary | ICD-10-CM

## 2019-09-29 HISTORY — PX: AMPUTATION: SHX166

## 2019-09-29 LAB — BASIC METABOLIC PANEL
Anion gap: 8 (ref 5–15)
BUN: 10 mg/dL (ref 8–23)
CO2: 27 mmol/L (ref 22–32)
Calcium: 8.2 mg/dL — ABNORMAL LOW (ref 8.9–10.3)
Chloride: 104 mmol/L (ref 98–111)
Creatinine, Ser: 1.15 mg/dL (ref 0.61–1.24)
GFR calc Af Amer: >60 mL/min (ref >60)
GFR calc non Af Amer: >60 mL/min (ref >60)
Glucose, Bld: 108 mg/dL — ABNORMAL HIGH (ref 70–99)
Potassium: 3 mmol/L — ABNORMAL LOW (ref 3.5–5.1)
Sodium: 139 mmol/L (ref 135–145)

## 2019-09-29 LAB — HEMOGLOBIN: Hemoglobin: 9.3 g/dL — ABNORMAL LOW (ref 13.0–17.0)

## 2019-09-29 LAB — CBC
HCT: 32.9 % — ABNORMAL LOW (ref 39.0–52.0)
HCT: 32.5 % — ABNORMAL LOW (ref 39.0–52.0)
Hemoglobin: 10.4 g/dL — ABNORMAL LOW (ref 13.0–17.0)
Hemoglobin: 9.7 g/dL — ABNORMAL LOW (ref 13.0–17.0)
MCH: 23.9 pg — ABNORMAL LOW (ref 26.0–34.0)
MCH: 24.1 pg — ABNORMAL LOW (ref 26.0–34.0)
MCHC: 31.6 g/dL (ref 30.0–36.0)
MCHC: 29.8 g/dL — ABNORMAL LOW (ref 30.0–36.0)
MCV: 75.5 fL — ABNORMAL LOW (ref 80.0–100.0)
MCV: 80.6 fL (ref 80.0–100.0)
Platelets: 409 10*3/uL — ABNORMAL HIGH (ref 150–400)
Platelets: 478 10*3/uL — ABNORMAL HIGH (ref 150–400)
RBC: 4.36 MIL/uL (ref 4.22–5.81)
RBC: 4.03 MIL/uL — ABNORMAL LOW (ref 4.22–5.81)
RDW: 15.9 % — ABNORMAL HIGH (ref 11.5–15.5)
RDW: 16.4 % — ABNORMAL HIGH (ref 11.5–15.5)
WBC: 13 10*3/uL — ABNORMAL HIGH (ref 4.0–10.5)
WBC: 18.1 10*3/uL — ABNORMAL HIGH (ref 4.0–10.5)
nRBC: 0 % (ref 0.0–0.2)
nRBC: 0 % (ref 0.0–0.2)

## 2019-09-29 LAB — MAGNESIUM: Magnesium: 2 mg/dL (ref 1.7–2.4)

## 2019-09-29 LAB — PROTIME-INR
INR: 1.1 (ref 0.8–1.2)
Prothrombin Time: 14 seconds (ref 11.4–15.2)

## 2019-09-29 LAB — GLUCOSE, CAPILLARY: Glucose-Capillary: 116 mg/dL — ABNORMAL HIGH (ref 70–99)

## 2019-09-29 LAB — APTT: aPTT: 42 seconds — ABNORMAL HIGH (ref 24–36)

## 2019-09-29 SURGERY — AMPUTATION, ABOVE KNEE
Anesthesia: General | Site: Knee | Laterality: Left

## 2019-09-29 MED ORDER — EPHEDRINE SULFATE 50 MG/ML IJ SOLN
INTRAMUSCULAR | Status: DC | PRN
  Administered 2019-09-29: 10 mg via INTRAVENOUS
  Administered 2019-09-29 (×2): 5 mg via INTRAVENOUS

## 2019-09-29 MED ORDER — FENTANYL CITRATE (PF) 100 MCG/2ML IJ SOLN
INTRAMUSCULAR | Status: DC | PRN
  Administered 2019-09-29 (×4): 50 ug via INTRAVENOUS

## 2019-09-29 MED ORDER — ESMOLOL HCL 100 MG/10ML IV SOLN
INTRAVENOUS | Status: DC | PRN
  Administered 2019-09-29: 10 mg via INTRAVENOUS

## 2019-09-29 MED ORDER — PIPERACILLIN-TAZOBACTAM 3.375 G IVPB
INTRAVENOUS | Status: AC
  Filled 2019-09-29: qty 50

## 2019-09-29 MED ORDER — MORPHINE SULFATE (PF) 2 MG/ML IV SOLN
2.0000 mg | INTRAVENOUS | Status: DC | PRN
  Administered 2019-09-29 – 2019-10-01 (×4): 2 mg via INTRAVENOUS
  Filled 2019-09-29 (×4): qty 1

## 2019-09-29 MED ORDER — SODIUM CHLORIDE 0.9 % IV SOLN: INTRAVENOUS | Status: DC

## 2019-09-29 MED ORDER — PROMETHAZINE HCL 25 MG/ML IJ SOLN: 6.2500 mg | INTRAMUSCULAR | Status: DC | PRN

## 2019-09-29 MED ORDER — CHLORHEXIDINE GLUCONATE 0.12 % MT SOLN
15.0000 mL | Freq: Two times a day (BID) | OROMUCOSAL | Status: DC
  Administered 2019-09-30 – 2019-10-03 (×5): 15 mL via OROMUCOSAL
  Filled 2019-09-29 (×8): qty 15

## 2019-09-29 MED ORDER — CEFAZOLIN SODIUM-DEXTROSE 2-4 GM/100ML-% IV SOLN
2.0000 g | INTRAVENOUS | Status: AC
  Administered 2019-09-30: 2 g via INTRAVENOUS
  Filled 2019-09-29: qty 100

## 2019-09-29 MED ORDER — FENTANYL CITRATE (PF) 100 MCG/2ML IJ SOLN
INTRAMUSCULAR | Status: AC
  Filled 2019-09-29: qty 2

## 2019-09-29 MED ORDER — PROPOFOL 10 MG/ML IV BOLUS
INTRAVENOUS | Status: DC | PRN
  Administered 2019-09-29: 80 mg via INTRAVENOUS
  Administered 2019-09-29: 40 mg via INTRAVENOUS

## 2019-09-29 MED ORDER — CHLORHEXIDINE GLUCONATE CLOTH 2 % EX PADS
6.0000 | MEDICATED_PAD | Freq: Every day | CUTANEOUS | Status: DC
  Administered 2019-09-30 – 2019-10-07 (×7): 6 via TOPICAL

## 2019-09-29 MED ORDER — DEXAMETHASONE SODIUM PHOSPHATE 10 MG/ML IJ SOLN
INTRAMUSCULAR | Status: DC | PRN
  Administered 2019-09-29: 5 mg via INTRAVENOUS

## 2019-09-29 MED ORDER — SEVOFLURANE IN SOLN
RESPIRATORY_TRACT | Status: AC
  Filled 2019-09-29: qty 250

## 2019-09-29 MED ORDER — FENTANYL CITRATE (PF) 100 MCG/2ML IJ SOLN
INTRAMUSCULAR | Status: AC
  Administered 2019-09-29: 25 ug via INTRAVENOUS
  Filled 2019-09-29: qty 2

## 2019-09-29 MED ORDER — HYDROCODONE-ACETAMINOPHEN 5-325 MG PO TABS
1.0000 | ORAL_TABLET | Freq: Four times a day (QID) | ORAL | Status: DC | PRN
  Administered 2019-09-29 – 2019-10-05 (×6): 2 via ORAL
  Administered 2019-10-05: 1 via ORAL
  Administered 2019-10-07: 2 via ORAL
  Filled 2019-09-29 (×7): qty 2
  Filled 2019-09-29: qty 1

## 2019-09-29 MED ORDER — LACTATED RINGERS IV SOLN: INTRAVENOUS | Status: DC | PRN

## 2019-09-29 MED ORDER — BUPIVACAINE-EPINEPHRINE (PF) 0.5% -1:200000 IJ SOLN
INTRAMUSCULAR | Status: AC
  Filled 2019-09-29: qty 30

## 2019-09-29 MED ORDER — ORAL CARE MOUTH RINSE
15.0000 mL | Freq: Two times a day (BID) | OROMUCOSAL | Status: DC
  Administered 2019-09-30 – 2019-10-03 (×2): 15 mL via OROMUCOSAL

## 2019-09-29 MED ORDER — ACETAMINOPHEN 160 MG/5ML PO SOLN
325.0000 mg | ORAL | Status: DC | PRN
  Filled 2019-09-29: qty 20.3

## 2019-09-29 MED ORDER — FENTANYL CITRATE (PF) 100 MCG/2ML IJ SOLN
25.0000 ug | INTRAMUSCULAR | Status: DC | PRN
  Administered 2019-09-29 (×3): 25 ug via INTRAVENOUS

## 2019-09-29 MED ORDER — BUPIVACAINE HCL (PF) 0.5 % IJ SOLN
INTRAMUSCULAR | Status: AC
  Filled 2019-09-29: qty 30

## 2019-09-29 MED ORDER — CHLORHEXIDINE GLUCONATE CLOTH 2 % EX PADS
6.0000 | MEDICATED_PAD | Freq: Once | CUTANEOUS | Status: AC
  Administered 2019-09-29: 6 via TOPICAL

## 2019-09-29 MED ORDER — SODIUM CHLORIDE 0.9 % IV SOLN
0.0000 ug/min | INTRAVENOUS | Status: DC
  Administered 2019-09-29 (×2): 20 ug/min via INTRAVENOUS
  Administered 2019-09-30: 25 ug/min via INTRAVENOUS
  Filled 2019-09-29 (×3): qty 10

## 2019-09-29 MED ORDER — PROPOFOL 10 MG/ML IV BOLUS
INTRAVENOUS | Status: AC
  Filled 2019-09-29: qty 20

## 2019-09-29 MED ORDER — ACETAMINOPHEN 325 MG PO TABS: 325.0000 mg | ORAL_TABLET | ORAL | Status: DC | PRN

## 2019-09-29 MED ORDER — PHENYLEPHRINE HCL (PRESSORS) 10 MG/ML IV SOLN
INTRAVENOUS | Status: DC | PRN
  Administered 2019-09-29: 100 ug via INTRAVENOUS

## 2019-09-29 MED ORDER — LIDOCAINE HCL (CARDIAC) PF 100 MG/5ML IV SOSY
PREFILLED_SYRINGE | INTRAVENOUS | Status: DC | PRN
  Administered 2019-09-29: 80 mg via INTRAVENOUS

## 2019-09-29 SURGICAL SUPPLY — 40 items
BLADE SAGITTAL WIDE XTHICK NO (BLADE) ×1 IMPLANT
BNDG CMPR STD VLCR NS LF 5.8X6 (GAUZE/BANDAGES/DRESSINGS) ×1
BNDG COHESIVE 4X5 TAN STRL (GAUZE/BANDAGES/DRESSINGS) ×1 IMPLANT
BNDG ELASTIC 6X5.8 VLCR NS LF (GAUZE/BANDAGES/DRESSINGS) ×3 IMPLANT
BNDG GAUZE 4.5X4.1 6PLY STRL (MISCELLANEOUS) ×6 IMPLANT
BRUSH SCRUB EZ  4% CHG (MISCELLANEOUS)
BRUSH SCRUB EZ 4% CHG (MISCELLANEOUS) ×1 IMPLANT
CANISTER SUCT 1200ML W/VALVE (MISCELLANEOUS) ×3 IMPLANT
CANISTER WOUND CARE 500ML ATS (WOUND CARE) IMPLANT
CHLORAPREP W/TINT 26ML (MISCELLANEOUS) ×3 IMPLANT
COVER WAND RF STERILE (DRAPES) ×3 IMPLANT
DRAPE INCISE IOBAN 66X45 STRL (DRAPES) ×3 IMPLANT
DRAPE INCISE IOBAN 66X60 STRL (DRAPES) ×3 IMPLANT
DRSG VAC ATS LRG SENSATRAC (GAUZE/BANDAGES/DRESSINGS) IMPLANT
ELECT CAUTERY BLADE 6.4 (BLADE) ×3 IMPLANT
ELECT REM PT RETURN 9FT ADLT (ELECTROSURGICAL) ×3
ELECTRODE REM PT RTRN 9FT ADLT (ELECTROSURGICAL) ×1 IMPLANT
GAUZE XEROFORM 1X8 LF (GAUZE/BANDAGES/DRESSINGS) ×2 IMPLANT
GLOVE BIO SURGEON STRL SZ7 (GLOVE) ×6 IMPLANT
GLOVE INDICATOR 7.5 STRL GRN (GLOVE) ×3 IMPLANT
GOWN STRL REUS W/ TWL LRG LVL3 (GOWN DISPOSABLE) ×1 IMPLANT
GOWN STRL REUS W/ TWL XL LVL3 (GOWN DISPOSABLE) ×2 IMPLANT
GOWN STRL REUS W/TWL LRG LVL3 (GOWN DISPOSABLE) ×3
GOWN STRL REUS W/TWL XL LVL3 (GOWN DISPOSABLE) ×6
HANDLE YANKAUER SUCT BULB TIP (MISCELLANEOUS) ×1 IMPLANT
KIT TURNOVER KIT A (KITS) ×3 IMPLANT
LABEL OR SOLS (LABEL) ×3 IMPLANT
NS IRRIG 1000ML POUR BTL (IV SOLUTION) ×3 IMPLANT
PACK EXTREMITY ARMC (MISCELLANEOUS) ×3 IMPLANT
PAD ABD DERMACEA PRESS 5X9 (GAUZE/BANDAGES/DRESSINGS) ×2 IMPLANT
PAD PREP 24X41 OB/GYN DISP (PERSONAL CARE ITEMS) ×3 IMPLANT
SPONGE LAP 18X18 RF (DISPOSABLE) ×8 IMPLANT
STAPLER SKIN PROX 35W (STAPLE) ×1 IMPLANT
STOCKINETTE M/LG 89821 (MISCELLANEOUS) ×3 IMPLANT
SUT SILK 2 0 (SUTURE)
SUT SILK 2 0 SH (SUTURE) ×2 IMPLANT
SUT SILK 2-0 18XBRD TIE 12 (SUTURE) ×1 IMPLANT
SUT VIC AB 0 CT1 36 (SUTURE) ×2 IMPLANT
SUT VIC AB 2-0 CT1 (SUTURE) ×2 IMPLANT
TUBE KAMVAC SUCTION (TUBING) ×2 IMPLANT

## 2019-09-29 NOTE — Op Note (Signed)
    OPERATIVE NOTE   PROCEDURE: 1. Irrigation and debridement of left above-knee amputation abscess with excision of multiple stents  PRE-OPERATIVE DIAGNOSIS: Nonviable tissue and abscess of   left above-knee amputation stump  POST-OPERATIVE DIAGNOSIS: Same as above  SURGEON: [name], MD  ASSISTANT(S): Hezzie Bump, PA-C  ANESTHESIA: general  ESTIMATED BLOOD LOSS: 500 cc  FINDING(S): Very large cavity almost the size of a basketball with much raw tissue and oozing, purulent material, and several stents that were easily removed  SPECIMEN(S): Stents and exudate sent for specimen, culture as well  INDICATIONS:   Timothy Juarez is a 69 y.o. male who presents with a very large abscess of the medial left thigh.  He has previous SFA interventions with stents in place that are no longer patent but are incorporated within the abscess cavity and are almost certainly infected.  The wound has necessitated has been draining purulent bloody material and he is brought to the operating room to fully evacuate the abscess.  Risks and benefits are discussed.  DESCRIPTION: After obtaining full informed written consent, the patient was brought back to the operating room and placed supine upon the operating table.  The patient received IV antibiotics prior to induction.  After obtaining adequate anesthesia, the patient was prepped and draped in the standard fashion.  The wound was then opened and the small hole in the medial thigh was extended proximally and distally to allow access to the wound and excisional debridement was performed to skin, soft tissue, and muscle that were grossly infected to remove all clearly non-viable tissue.  The tissue was taken back to bleeding tissue that appeared viable.  The debridement was performed with electrocautery and blunt dissection and encompassed an area of approximately over 200 cm2.  The wound was irrigated copiously with 3 L of pulse lavage saline.  There was  a large amount of old blood removed as well as a lot of friable tissue with continuous oozing during the surgery.  There were at least 3 stents that were removed and were essentially free-floating within the wound when it was opened.  After all clearly non-viable tissue was removed, there was still a fair bit of oozing and I did not think a negative pressure dressing would likely continue to seal.  The best option would be to perform a wet-to-dry dressing plan to bring him back to the operating room in the next 24 to 48 hours for change of the dressing, wound debridement, and potentially place a VAC dressing at that time.  2 Curlex dressings were tied together and packed into the wound is a wet-to-dry dressing with ABDs and a dry Curlex wrapped around this. The patient was then awakened from anesthesia and taken to the recovery room in stable condition having tolerated the procedure well.  COMPLICATIONS: none  CONDITION: stable  [name]  09/29/2019, 2:24 PM   This note was created with Dragon Medical transcription system. Any errors in dictation are purely unintentional.

## 2019-09-29 NOTE — Progress Notes (Signed)
Patient ID: Timothy Juarez, male   DOB: 1951-06-14, 69 y.o.   MRN: [id number]  Informed by vascular surgery patient had about 900 mL of old and new blood. Patient is tachycardic heart rate in the 130s in PACU. Stat hemoglobin pending. Blood pressure soft. Will transfer patient to step down for overnight close monitoring given significant amount of blood loss during surgery. Two units on hold. Transfuse as needed. Charge nurse ICU aware.

## 2019-09-29 NOTE — Progress Notes (Signed)
Patient ID: Timothy Juarez, male   DOB: 15-May-1951, 69 y.o.   MRN: [id number]  Patient seen in PACU. He is awake. Heart rate is in the 106. Blood pressure 98/63 with map of 75. Currently on IV Neosynphrine some oozing noted at the surgical site. ID consultation placed  Next blood draw around 8 PM. Transfuse if hemoglobin continues to drop  Spoke with daughter Everlena Cooper and updated her.

## 2019-09-29 NOTE — Progress Notes (Signed)
[facility] [place] [facility] [place] NAME: Timothy Juarez    MR#:  [id number]  DATE OF BIRTH:  March 29, 1951  SUBJECTIVE:   Patient is a resident at [facility] comes in with bleeding from amputation stump. Found to have some fluid collection around the left amputation stump. Denies any fever.  Has bleeding from the stump site--dressing applied. Schedule for OR at 1 pm REVIEW OF SYSTEMS:   Review of Systems  Constitutional: Negative for chills, fever and weight loss.  HENT: Negative for ear discharge, ear pain and nosebleeds.   Eyes: Negative for blurred vision, pain and discharge.  Respiratory: Negative for sputum production, shortness of breath, wheezing and stridor.   Cardiovascular: Negative for chest pain, palpitations, orthopnea and PND.  Gastrointestinal: Negative for abdominal pain, diarrhea, nausea and vomiting.  Genitourinary: Negative for frequency and urgency.  Musculoskeletal: Negative for back pain and joint pain.       Pain and swelling around the left amputation stump. Bleeding from stump site++  Neurological: Negative for sensory change, speech change, focal weakness and weakness.  Psychiatric/Behavioral: Negative for depression and hallucinations. The patient is not nervous/anxious.    Tolerating Diet:yes Tolerating PT: does not walk at baseline. Does bed to WC transfers  DRUG ALLERGIES:  No Known Allergies  VITALS:  Blood pressure (!) 119/57, pulse 61, temperature 97.7 F (36.5 C), temperature source Oral, resp. rate 18, height 6\' 1"  (1.854 m), weight 95.3 kg, SpO2 99 %.  PHYSICAL EXAMINATION:   Physical Exam  GENERAL:  69 y.o.-year-old patient lying in the bed with no acute distress. Obese EYES: Pupils equal, round, reactive to light and accommodation. No scleral icterus.   HEENT: Head atraumatic, normocephalic. Oropharynx and nasopharynx clear.  NECK:  Supple, no jugular venous distention. No thyroid enlargement, no  tenderness.  LUNGS: Normal breath sounds bilaterally, no wheezing, rales, rhonchi. No use of accessory muscles of respiration.  CARDIOVASCULAR: S1, S2 normal. No murmurs, rubs, or gallops.  ABDOMEN: Soft, nontender, nondistended. Bowel sounds present. No organomegaly or mass.  EXTREMITIES: left amputation stump, oozing blood+    NEUROLOGIC: Cranial nerves II through XII are intact. Moves all extremities well PSYCHIATRIC:  patient is alert and oriented x 3.  SKIN: No obvious rash, lesion, or ulcer.   LABORATORY PANEL:  CBC Recent Labs  Lab 09/29/19 0435  WBC 13.0*  HGB 10.4*  HCT 32.9*  PLT 409*    Chemistries  Recent Labs  Lab 09/27/19 2143 09/28/19 0225 09/29/19 0435  NA 138   < > 139  K 3.5   < > 3.0*  CL 103   < > 104  CO2 24   < > 27  GLUCOSE 111*   < > 108*  BUN 15   < > 10  CREATININE 1.26*   < > 1.15  CALCIUM 8.7*   < > 8.2*  MG  --   --  2.0  AST 18  --   --   ALT 13  --   --   ALKPHOS 122  --   --   BILITOT 0.9  --   --    < > = values in this interval not displayed.   Cardiac Enzymes No results for input(s): TROPONINI in the last 168 hours. RADIOLOGY:  CT FEMUR LEFT W CONTRAST  Result Date: 09/27/2019 CLINICAL DATA:  Left thigh abscess. Bleeding from left above knee amputation site. EXAM: CT OF THE LOWER RIGHT EXTREMITY WITH CONTRAST TECHNIQUE:  Multidetector CT imaging of the lower right extremity was performed according to the standard protocol following intravenous contrast administration. COMPARISON:  None. CONTRAST:  122mL OMNIPAQUE IOHEXOL 300 MG/ML  SOLN FINDINGS: Status post left above knee amputation. There is a heterogeneous collection within the medial aspect of the left thigh soft tissues, adjacent to the distal remaining component of the femur. There is an internal AV fistula catheter. The collection measures 10.4 x 17.1 x 6.2 cm, previously 11.4 x 17.1 x 8.5 cm. There is surrounding inflammatory stranding. The collection extends to the medial  thigh skin surface (series 7, image 193). No osteolysis or other bony irregularity of the left femur. IMPRESSION: 1. Decreased size of heterogeneous collection of the left thigh, measuring 10.4 x 17.1 x 6.2 cm, likely a combination of hematoma and abscess. The collection extends to the medial thigh skin surface. 2. No osteolysis or other bony irregularity. Electronically Signed   By: Ulyses Jarred M.D.   On: 09/27/2019 23:02   DG Chest Port 1 View  Result Date: 09/27/2019 CLINICAL DATA:  Sepsis EXAM: PORTABLE CHEST 1 VIEW COMPARISON:  12/31/2018 FINDINGS: Heart is normal size. Areas of scarring in the lungs bilaterally. No acute confluent opacities or effusions. No acute bony abnormality. IMPRESSION: Chronic changes.  No acute cardiopulmonary disease. Electronically Signed   By: Rolm Baptise M.D.   On: 09/27/2019 22:14   ECHOCARDIOGRAM COMPLETE  Result Date: 09/28/2019    ECHOCARDIOGRAM REPORT   Patient Name:   Timothy Juarez Date of Exam: 09/28/2019 Medical Rec #:  [id number]         Height:       73.0 in Accession #:    [id number]        Weight:       210.0 lb Date of Birth:  07/28/1951         BSA:          2.20 m Patient Age:    66 years          BP:           117/56 mmHg Patient Gender: M                 HR:           67 bpm. Exam Location:  [facility] Procedure: 2D Echo, Cardiac Doppler and Color Doppler Indications:     CHF 428.0  History:         Patient has prior history of Echocardiogram examinations, most                  recent 10/11/2017. COPD; Risk Factors:Hypertension. PVD.  Sonographer:     JERRY Referring Phys:  [id number] Athena Masse Diagnosing Phys: Kate Sable MD  Sonographer Comments: Technically difficult study due to poor echo windows and no parasternal window. Image acquisition challenging due to COPD. IMPRESSIONS  1. Left ventricular ejection fraction, by estimation, is 60 to 65%. The left ventricle has normal function. The left ventrical has no regional wall motion abnormalities.  Left ventricular diastolic function could not be evaluated.  2. Right ventricular systolic function was not well visualized. The right ventricular size is not well visualized.  3. No left atrial/left atrial appendage thrombus was detected.  4. The mitral valve is grossly normal. no evidence of mitral valve regurgitation.  5. Tricuspid valve regurgitation not assessed.  6. The aortic valve is grossly normal. Aortic valve regurgitation is not visualized.  7. Pulmonic valve regurgitation not assessed. FINDINGS  Left Ventricle: Left ventricular ejection fraction, by estimation, is 60 to 65%. The left ventricle has normal function. The left ventricle has no regional wall motion abnormalities. The left ventricular internal cavity size was normal in size. There is  no left ventricular hypertrophy. Left ventricular diastolic function could not be evaluated. Right Ventricle: The right ventricular size is not well visualized. Right vetricular wall thickness was not assessed. Right ventricular systolic function was not well visualized. Left Atrium: Left atrial size was normal in size. Right Atrium: Right atrial size was normal in size. Pericardium: There is no evidence of pericardial effusion. Mitral Valve: The mitral valve is grossly normal. No evidence of mitral valve regurgitation. Tricuspid Valve: The tricuspid valve is not well visualized. Tricuspid valve regurgitation not assessed. Aortic Valve: The aortic valve is grossly normal. Aortic valve regurgitation is not visualized. Aortic valve mean gradient measures 1.0 mmHg. Aortic valve peak gradient measures 2.6 mmHg. Aortic valve area, by VTI measures 6.68 cm. Pulmonic Valve: The pulmonic valve was not well visualized. Pulmonic valve regurgitation not assessed. Aorta: The aortic root was not well visualized and the aortic root is normal in size and structure. Venous: The inferior vena cava was not well visualized. IAS/Shunts: No atrial level shunt detected by color flow  Doppler.  LEFT VENTRICLE PLAX 2D LVIDd:         2.26 cm  Diastology LVIDs:         1.65 cm  LV e' lateral: 6.64 cm/s LV PW:         1.00 cm  LV e' medial:  6.31 cm/s LV IVS:        1.12 cm LVOT diam:     2.00 cm LV SV:         84.19 ml LV SV Index:   4.29 LVOT Area:     3.14 cm  RIGHT VENTRICLE RV Basal diam:  3.78 cm TAPSE (M-mode): 3.8 cm LEFT ATRIUM           Index      RIGHT ATRIUM           Index LA diam:      2.90 cm 1.32 cm/m RA Area:     13.40 cm LA Vol (A4C): 15.4 ml 7.01 ml/m RA Volume:   33.00 ml  15.02 ml/m  AORTIC VALVE AV Area (Vmax):    3.50 cm AV Area (Vmean):   4.44 cm AV Area (VTI):     6.68 cm AV Vmax:           80.30 cm/s AV Vmean:          48.100 cm/s AV VTI:            0.126 m AV Peak Grad:      2.6 mmHg AV Mean Grad:      1.0 mmHg LVOT Vmax:         89.40 cm/s LVOT Vmean:        68.000 cm/s LVOT VTI:          0.268 m LVOT/AV VTI ratio: 2.13 TRICUSPID VALVE TR Peak grad:   9.7 mmHg TR Vmax:        156.00 cm/s  SHUNTS Systemic VTI:  0.27 m Systemic Diam: 2.00 cm Kate Sable MD Electronically signed by Kate Sable MD Signature Date/Time: 09/28/2019/3:41:28 PM    Final    ASSESSMENT AND PLAN:  Timothy Juarez is a 69 y.o. male with medical history significant for COPD, diastolic heart failure, hospitalized for  Covid Pneumonia in May 2020, history of recurrent DVT on Eliquis, hypertension, with history of peripheral vascular disease with left SFA stent with history of left AKA, who was sent into the emergency room by vascular surgeon Dr. Lucky Cowboy due to concern for abscess at the left AKA stump, probably related to old stent and SFA  Abscess/Local bleedingof left AKA stump, with history of SFA stent left thigh -Continue vancomycin and Zosyn -WC rare GPR, No growth <12 hrs -Dr.Dew consulted, plans to take patient to the OR for drainage and stent removal today -Lactic acid 1.3 -CT femur left shows heterogeneous collection of the left thigh, measuring 10.4 x 17.1 x 6.2 cm,  likely a combination of hematoma and abscess. The collection extends to the medial thigh skin surface    AKI (acute kidney injury) (HCC)--resolved -Suspect prerenal -IV hydration and monitor renal function -creat 1.26--1.1    PAD (peripheral artery disease) ([place]) -On aspirin and atorvastatin can be held wile n.p.o.h    Essential hypertension -BP stable. Currently not on any po meds    History of COVID-19 -No acute concerns.  Was hospitalized for COVID-19 pneumonia with hypoxia in May 2020    (HFpEF) heart failure with preserved ejection fraction ([place]) -EF 60 to 65% in February 2019 -well compensated -holding diuretics at present   COPD with chronic bronchitis ([place]) Not acutely exacerbated Continue home bronchodilator treatments.  DuoNebs as needed    Chronic anticoagulation with eliquis for history of DVT -Patient had suction of a large amount of blood from the amputation stump so will cover with subcu heparin instead of full bridge and resume po eliquis after surgery when ok with dr dew    DVT prophylaxis:  holding eliquis pending amputation stump surgery Code Status: full code  Family Communication: none  Disposition Plan: Back to previous SNF environment Consults called: Dr. Lucky Cowboy  Barriers to dishcarge: pending surgery  TOTAL TIME TAKING CARE OF THIS PATIENT: *30* minutes.  >50% time spent on counselling and coordination of care  Note: This dictation was prepared with Dragon dictation along with smaller phrase technology. Any transcriptional errors that result from this process are unintentional.  Fritzi Mandes M.D    [facility]   CC: Primary care physician; Alvester Morin, MDPatient ID: Timothy Juarez, male   DOB: [birth year], 69 y.o.   MRN: [id number]

## 2019-09-29 NOTE — Progress Notes (Signed)
Started neo drip at 58mcg/min    Cbc  Not bad  Dr patel aware

## 2019-09-29 NOTE — Consult Note (Signed)
Name: Timothy Juarez MRN: [id number] DOB: 08-Aug-1951    ADMISSION DATE:  09/27/2019 CONSULTATION DATE: 09/29/2019  REFERRING MD : Dr. Posey Pronto   CHIEF COMPLAINT: Bleed of Left AKA  BRIEF PATIENT DESCRIPTION:  69 yo male admitted with abscess/local bleeding of left AKA stump s/p irrigation and debridement of left above knee amputation abscess with excision of multiple stents developed postop hypotension requiring low dose neo-synephrine gtt   SIGNIFICANT EVENTS/STUDIES:  02/10: Pt admitted to medsurg unit  02/11: Pt underwent irrigation and debridement of left AKA and developed hypotension requiring low dose neo-synephrine gtt PCCM consulted   HISTORY OF PRESENT ILLNESS:  This is a 69 yo male with a history of left AKA in March 2017 who presented to [facility] ER on 02/9 with c/o pain/swelling and bleeding at the left AKA stump.  CT of left femur revealed left thigh hematoma/abscess.  ER lab results revealed creatinine 1.26, albumin 2.8, lactic acid 1.3, wbc 16.5, and hgb 12.3, K+ 3.2. Pt subsequently admitted to the [facility] unit for additional workup and treatment.  Vascular surgery consulted and were concerned left AKA stump abscess probably related to old stent and SFA.  Therefore, on 02/11 pt underwent irrigation and debridement of left above knee amputation abscess with excision of multiple stents. Pt developed postop hypotension requiring low dose neo-synephrine gtt and transfer to ICU.  PCCM consulted to assist with management.   PAST MEDICAL HISTORY :   has a past medical history of Acute embolism and thombos unsp deep vn unsp lower extremity ([place]), Acute respiratory failure ([place]), Allergy, Anemia, Aneurysm of unspecified site ([place]), ARF (acute respiratory failure) ([place]), Bronchitis, CHF (congestive heart failure) ([place]), COPD (chronic obstructive pulmonary disease) (HCC), Cough, Epistaxis, GERD (gastroesophageal reflux disease), Gout, Hyperlipidemia, Hypertension, Hypokalemia, Insomnia, Muscle  contracture, Peripheral vascular disease ([place]), Pneumonia, and Pressure ulcer.  has a past surgical history that includes Cardiac catheterization (Left, 01/18/2015); Cardiac catheterization (N/A, 01/18/2015); Cardiac catheterization (07/30/2015); Cardiac catheterization (N/A, 07/30/2015); Cardiac catheterization (Left, 08/22/2015); Cardiac catheterization (Left, 08/22/2015); Amputation (Left, 09/19/2015); Wound debridement (Left, 10/18/2015); Application if wound vac (Left, 10/18/2015); Amputation (Left, 11/01/2015); Cardiac catheterization (Right, 03/31/2016); Cardiac catheterization (03/31/2016); Cardiac catheterization (Left, 04/10/2016); Colonoscopy with propofol (N/A, 05/25/2017); Esophagogastroduodenoscopy (egd) with propofol (N/A, 05/25/2017); Givens capsule study (N/A, 07/14/2017); IVC FILTER INSERTION (N/A, 10/12/2017); Esophagogastroduodenoscopy (egd) with propofol (N/A, 10/14/2017); Colonoscopy with propofol (N/A, 10/14/2017); Givens capsule study (N/A, 10/30/2017); Lower Extremity Angiography (Right, 10/04/2018); Cataract extraction w/PHACO (Right, 08/18/2019); and Eye surgery. Prior to Admission medications   Medication Sig Start Date End Date Taking? Authorizing Provider  acetaminophen (TYLENOL) 325 MG tablet Take 650 mg by mouth every 4 (four) hours as needed for fever.    Yes [provider]  acidophilus (RISAQUAD) CAPS capsule Take 1 capsule by mouth 2 (two) times daily.   Yes [provider]  ammonium lactate (AMLACTIN) 12 % cream Apply topically 2 (two) times daily. Apply to right foot   Yes [provider]  amoxicillin-clavulanate (AUGMENTIN) 500-125 MG tablet Take 1 tablet by mouth 2 (two) times daily. 09/23/19 09/29/19 Yes [provider]  aspirin EC 81 MG tablet Take 1 tablet (81 mg total) by mouth daily. 10/04/18  Yes Dew, Erskine Squibb, MD  atorvastatin (LIPITOR) 10 MG tablet Take 1 tablet (10 mg total) by mouth daily. Patient taking differently: Take 10 mg by mouth at bedtime.   10/04/18 10/04/19 Yes Dew, Erskine Squibb, MD  baclofen (LIORESAL) 10 MG tablet Take 10 mg by mouth 3 (three) times daily. Hold  for sedation   Yes [provider]  brimonidine-timolol (COMBIGAN) 0.2-0.5 % ophthalmic solution Place 1 drop into the right eye 2 (two) times daily.   Yes [provider]  ciclopirox (PENLAC) 8 % solution Apply 1 application topically daily. Apply solutions to toenails   Yes [provider]  Difluprednate (DUREZOL) 0.05 % EMUL Place 1 drop into the right eye 3 (three) times daily.    Yes [provider]  docusate sodium (COLACE) 100 MG capsule Take 100 mg by mouth daily as needed for mild constipation.   Yes [provider]  ELIQUIS 5 MG TABS tablet Take 5 mg by mouth 2 (two) times daily.  07/01/18  Yes [provider]  fentaNYL (DURAGESIC) 12 MCG/HR Place 1 patch onto the skin every 3 (three) days. 01/04/19  Yes Patrecia Pour, MD  ferrous sulfate 324 (65 Fe) MG TBEC Take 324 mg by mouth 2 (two) times daily.    Yes [provider]  furosemide (LASIX) 40 MG tablet Take 1 tablet (40 mg total) by mouth daily. 10/15/17  Yes Wieting, Richard, MD  gabapentin (NEURONTIN) 100 MG capsule Take 100 mg by mouth at bedtime.    Yes [provider]  guaiFENesin (ROBITUSSIN) 100 MG/5ML SOLN Take 15 mLs by mouth 3 (three) times daily as needed for cough (congestion).    Yes [provider]  loperamide (IMODIUM) 2 MG capsule Take 2 mg by mouth 3 (three) times daily as needed for diarrhea or loose stools.   Yes [provider]  Melatonin 5 MG TABS Take 5 mg by mouth at bedtime as needed (insomnia).    Yes [provider]  nitroGLYCERIN (NITROSTAT) 0.4 MG SL tablet Place 0.14 mg under the tongue every 5 (five) minutes as needed for chest pain.   Yes [provider]  Omega-3 Fatty Acids (FISH OIL) 1000 MG CAPS Take 1,000 mg by mouth daily.    Yes [provider]  Oxycodone HCl 10 MG TABS  Take 10 mg by mouth every 8 (eight) hours as needed (moderate to severe pain).   Yes [provider]  oxymetazoline (AFRIN) 0.05 % nasal spray Place 2 sprays into both nostrils 2 (two) times daily as needed (For nose bleeds).    Yes [provider]  potassium chloride SA (K-DUR,KLOR-CON) 20 MEQ tablet Take 20 mEq by mouth daily. With or after meal 10/15/17  Yes [provider]  sodium chloride (OCEAN) 0.65 % SOLN nasal spray Place 2 sprays into both nostrils 2 (two) times daily as needed for congestion.   Yes [provider]  TRELEGY ELLIPTA 100-62.5-25 MCG/INH AEPB Inhale 1 puff into the lungs every morning.  09/24/18  Yes [provider]   No Known Allergies  FAMILY HISTORY:  family history includes Heart attack in his mother. SOCIAL HISTORY:  reports that he quit smoking about 6 years ago. His smoking use included cigarettes. He has a 44.00 pack-year smoking history. He has never used smokeless tobacco. He reports that he does not drink alcohol or use drugs.  REVIEW OF SYSTEMS: Positives in BOLD    Constitutional: Negative for fever, chills, weight loss, malaise/fatigue and diaphoresis.  HENT: Negative for hearing loss, ear pain, nosebleeds, congestion, sore throat, neck pain, tinnitus and ear discharge.   Eyes: Negative for blurred vision, double vision, photophobia, pain, discharge and redness.  Respiratory: Negative for cough, hemoptysis, sputum production, shortness of breath, wheezing and stridor.   Cardiovascular: Negative for chest pain, palpitations,  orthopnea, claudication, leg swelling and PND.  Gastrointestinal: Negative for heartburn, nausea, vomiting, abdominal pain, diarrhea, constipation, blood in stool and melena.  Genitourinary: Negative for dysuria, urgency, frequency, hematuria and flank pain.  Musculoskeletal: left aka stump pain, myalgias, back pain, joint pain and falls.  Skin: Negative for itching and rash.  Neurological:  Negative for dizziness, tingling, tremors, sensory change, speech change, focal weakness, seizures, loss of consciousness, weakness and headaches.  Endo/Heme/Allergies: Negative for environmental allergies and polydipsia. Does not bruise/bleed easily.  SUBJECTIVE:  c/o of left stump pain, however has improved since administration of pain medications   VITAL SIGNS: Temp:  [97.1 F (36.2 C)-98.3 F (36.8 C)] 97.6 F (36.4 C) (02/11 2000) Pulse Rate:  [54-136] 89 (02/11 2000) Resp:  [9-18] 13 (02/11 2000) BP: (79-132)/(56-87) 115/68 (02/11 2000) SpO2:  [96 %-100 %] 100 % (02/11 2000) Weight:  [95.3 kg] 95.3 kg (02/11 1218)  PHYSICAL EXAMINATION: General: well developed, well nourished male, resting in bed NAD Neuro: alert and oriented, follows commands HEENT: supple, no JVD  Cardiovascular: nsr, rrr, no R/G, RLE 1+ distal pulses, 1+ [name] femoral pulse  Lungs: clear throughout, even, non labored  Abdomen: +BS x4, soft, obese, non tender, non distended  Musculoskeletal: left AKA Skin: left AKA stump incision site, dressing dry and intact, right foot cool to touch, dry flaky skin   Recent Labs  Lab 09/27/19 2143 09/28/19 0225 09/29/19 0435  NA 138 137 139  K 3.5 3.2* 3.0*  CL 103 103 104  CO2 24 25 27   BUN 15 14 10   CREATININE 1.26* 1.14 1.15  GLUCOSE 111* 112* 108*   Recent Labs  Lab 09/28/19 0225 09/28/19 0225 09/29/19 0435 09/29/19 1445 09/29/19 2026  HGB 11.2*   < > 10.4* 9.7* 9.3*  HCT 36.2*  --  32.9* 32.5*  --   WBC 13.6*  --  13.0* 18.1*  --   PLT 413*  --  409* 478*  --    < > = values in this interval not displayed.   CT FEMUR LEFT W CONTRAST  Result Date: 09/27/2019 CLINICAL DATA:  Left thigh abscess. Bleeding from left above knee amputation site. EXAM: CT OF THE LOWER RIGHT EXTREMITY WITH CONTRAST TECHNIQUE: Multidetector CT imaging of the lower right extremity was performed according to the standard protocol following intravenous contrast administration.  COMPARISON:  None. CONTRAST:  124mL OMNIPAQUE IOHEXOL 300 MG/ML  SOLN FINDINGS: Status post left above knee amputation. There is a heterogeneous collection within the medial aspect of the left thigh soft tissues, adjacent to the distal remaining component of the femur. There is an internal AV fistula catheter. The collection measures 10.4 x 17.1 x 6.2 cm, previously 11.4 x 17.1 x 8.5 cm. There is surrounding inflammatory stranding. The collection extends to the medial thigh skin surface (series 7, image 193). No osteolysis or other bony irregularity of the left femur. IMPRESSION: 1. Decreased size of heterogeneous collection of the left thigh, measuring 10.4 x 17.1 x 6.2 cm, likely a combination of hematoma and abscess. The collection extends to the medial thigh skin surface. 2. No osteolysis or other bony irregularity. Electronically Signed   By: Ulyses Jarred M.D.   On: 09/27/2019 23:02   DG Chest Port 1 View  Result Date: 09/27/2019 CLINICAL DATA:  Sepsis EXAM: PORTABLE CHEST 1 VIEW COMPARISON:  12/31/2018 FINDINGS: Heart is normal size. Areas of scarring in the lungs bilaterally. No acute confluent opacities or effusions. No acute bony abnormality. IMPRESSION: Chronic changes.  No acute cardiopulmonary disease. Electronically Signed   By: Rolm Baptise M.D.   On: 09/27/2019 22:14   ECHOCARDIOGRAM COMPLETE  Result Date: 09/28/2019    ECHOCARDIOGRAM REPORT   Patient Name:   JILL RUPPE Date of Exam: 09/28/2019 Medical Rec #:  [id number]         Height:       73.0 in Accession #:    [id number]        Weight:       210.0 lb Date of Birth:  08/19/1950         BSA:          2.20 m Patient Age:    87 years          BP:           117/56 mmHg Patient Gender: M                 HR:           67 bpm. Exam Location:  [facility] Procedure: 2D Echo, Cardiac Doppler and Color Doppler Indications:     CHF 428.0  History:         Patient has prior history of Echocardiogram examinations, most                  recent 10/11/2017.  COPD; Risk Factors:Hypertension. PVD.  Sonographer:     JERRY Referring Phys:  [id number] Athena Masse Diagnosing Phys: Kate Sable MD  Sonographer Comments: Technically difficult study due to poor echo windows and no parasternal window. Image acquisition challenging due to COPD. IMPRESSIONS  1. Left ventricular ejection fraction, by estimation, is 60 to 65%. The left ventricle has normal function. The left ventrical has no regional wall motion abnormalities. Left ventricular diastolic function could not be evaluated.  2. Right ventricular systolic function was not well visualized. The right ventricular size is not well visualized.  3. No left atrial/left atrial appendage thrombus was detected.  4. The mitral valve is grossly normal. no evidence of mitral valve regurgitation.  5. Tricuspid valve regurgitation not assessed.  6. The aortic valve is grossly normal. Aortic valve regurgitation is not visualized.  7. Pulmonic valve regurgitation not assessed. FINDINGS  Left Ventricle: Left ventricular ejection fraction, by estimation, is 60 to 65%. The left ventricle has normal function. The left ventricle has no regional wall motion abnormalities. The left ventricular internal cavity size was normal in size. There is  no left ventricular hypertrophy. Left ventricular diastolic function could not be evaluated. Right Ventricle: The right ventricular size is not well visualized. Right vetricular wall thickness was not assessed. Right ventricular systolic function was not well visualized. Left Atrium: Left atrial size was normal in size. Right Atrium: Right atrial size was normal in size. Pericardium: There is no evidence of pericardial effusion. Mitral Valve: The mitral valve is grossly normal. No evidence of mitral valve regurgitation. Tricuspid Valve: The tricuspid valve is not well visualized. Tricuspid valve regurgitation not assessed. Aortic Valve: The aortic valve is grossly normal. Aortic valve regurgitation is  not visualized. Aortic valve mean gradient measures 1.0 mmHg. Aortic valve peak gradient measures 2.6 mmHg. Aortic valve area, by VTI measures 6.68 cm. Pulmonic Valve: The pulmonic valve was not well visualized. Pulmonic valve regurgitation not assessed. Aorta: The aortic root was not well visualized and the aortic root is normal in size and structure. Venous: The inferior vena cava was not well visualized. IAS/Shunts: No atrial level shunt detected by color  flow Doppler.  LEFT VENTRICLE PLAX 2D LVIDd:         2.26 cm  Diastology LVIDs:         1.65 cm  LV e' lateral: 6.64 cm/s LV PW:         1.00 cm  LV e' medial:  6.31 cm/s LV IVS:        1.12 cm LVOT diam:     2.00 cm LV SV:         84.19 ml LV SV Index:   4.29 LVOT Area:     3.14 cm  RIGHT VENTRICLE RV Basal diam:  3.78 cm TAPSE (M-mode): 3.8 cm LEFT ATRIUM           Index      RIGHT ATRIUM           Index LA diam:      2.90 cm 1.32 cm/m RA Area:     13.40 cm LA Vol (A4C): 15.4 ml 7.01 ml/m RA Volume:   33.00 ml  15.02 ml/m  AORTIC VALVE AV Area (Vmax):    3.50 cm AV Area (Vmean):   4.44 cm AV Area (VTI):     6.68 cm AV Vmax:           80.30 cm/s AV Vmean:          48.100 cm/s AV VTI:            0.126 m AV Peak Grad:      2.6 mmHg AV Mean Grad:      1.0 mmHg LVOT Vmax:         89.40 cm/s LVOT Vmean:        68.000 cm/s LVOT VTI:          0.268 m LVOT/AV VTI ratio: 2.13 TRICUSPID VALVE TR Peak grad:   9.7 mmHg TR Vmax:        156.00 cm/s  SHUNTS Systemic VTI:  0.27 m Systemic Diam: 2.00 cm Kate Sable MD Electronically signed by Kate Sable MD Signature Date/Time: 09/28/2019/3:41:28 PM    Final     ASSESSMENT / PLAN:  Abscess/local bleeding of left AKA stump s/p I&D with multiple stent removals-09/29/2019 Postop hypotension secondary to acute blood loss and infection  Acute blood loss anemia  Acute renal failure secondary to above-improving  Postop pain  Hx: CHF wit preserved EF, COPD, and DVT on chronic eliquis  Continuous telemetry  monitoring  Gentle fluid resuscitation and prn neo-synephrine gtt to maintain map >65 Lasix as bp tolerates  ID consulted appreciate input-continue vancomycin and zosyn for now  Follow cultures  Trend WBC and monitor fever curve  Hold outpatient eliquis and aspirin for now due acute blood loss anemia  Trend CBC  Monitor for s/sx of bleeding and transfuse for hgb <7 Trend BMP  Replace electrolytes as indicated  Monitor UOP Avoid nephrotoxic medications when possible Continue fentanyl patch and prn morphine/norco for pain management  Supplemental O2 for dyspnea and/or hypoxia  Continue bronchodilator therapy   Marda Stalker, [place] Pager [phone] (please enter 7 digits) PCCM Consult Pager [phone] (please enter 7 digits)

## 2019-09-29 NOTE — TOC Initial Note (Signed)
Transition of Care [facility]) - Initial/Assessment Note    Patient Details  Name: Timothy Juarez MRN: [id number] Date of Birth: [birth year]  [facility] CM/SW Contact:    Su Hilt, RN Phone Number: 09/29/2019, 9:31 AM  Clinical Narrative:                  The patient is a resident at [facility] and will return at [place], He will have the Stents removed and Abcess drained in the OR today.  FL2 completed, PASSR number obtained and this was sent to [facility] thru the [place]       Patient Goals and CMS Choice        Expected Discharge Plan and Services                                                Prior Living Arrangements/Services                       Activities of Daily Living Home Assistive Devices/Equipment: Wheelchair ADL Screening (condition at time of admission) Patient's cognitive ability adequate to safely complete daily activities?: Yes Is the patient deaf or have difficulty hearing?: No Does the patient have difficulty seeing, even when wearing glasses/contacts?: No Does the patient have difficulty concentrating, remembering, or making decisions?: Yes Patient able to express need for assistance with ADLs?: Yes Does the patient have difficulty dressing or bathing?: Yes Independently performs ADLs?: No Does the patient have difficulty walking or climbing stairs?: Yes Weakness of Legs: Both Weakness of Arms/Hands: None  Permission Sought/Granted                  Emotional Assessment              Admission diagnosis:  Abscess [L02.91] Abscess of left leg [L02.416] Patient Active Problem List   Diagnosis Date Noted  . Cellulitis and abscess of leg 09/27/2019  . Infection of above knee amputation stump ([place]) 09/27/2019  . History of COVID-19 09/27/2019  . Abscess of left AKA stump 09/27/2019  . (HFpEF) heart failure with preserved ejection fraction ([place]) 09/27/2019  . COPD with chronic bronchitis ([place]) 09/27/2019   . Chronic anticoagulation with eliquis 09/27/2019  . Preoperative clearance 09/27/2019  . Acute on chronic respiratory failure with hypoxia ([place]) 12/27/2018  . Community acquired bilateral lower lobe pneumonia 12/27/2018  . COVID-19 virus infection 12/27/2018  . High anion gap metabolic acidosis 84/66/5993  . Severe sepsis ([place]) 12/27/2018  . AKI (acute kidney injury) ([place]) 12/27/2018  . Toxic encephalopathy 12/27/2018  . Acute respiratory failure with hypoxia ([place]) 12/27/2018  . Hx of AKA (above knee amputation), left ([place]) 11/02/2018  . Symptomatic anemia 10/10/2017  . Gastrointestinal hemorrhage   . Chronic deep vein thrombosis (DVT) of left lower extremity ([place])   . Iron deficiency anemia 04/21/2017  . Essential hypertension 03/13/2017  . Recurrent deep vein thrombosis (DVT) ([place]) 03/11/2017  . Hyperlipidemia 11/04/2016  . Pressure ulcer 04/11/2016  . Pseudoaneurysm of left femoral artery ([place]) 04/11/2016  . PAD (peripheral artery disease) ([place]) 04/10/2016  . Atherosclerosis of native arteries of extremities with gangrene, left leg ([place]) 11/01/2015  . Ischemia of lower extremity 09/14/2015  . Atherosclerotic peripheral vascular disease with ulceration ([place]) 01/18/2015   PCP:  Alvester Morin, MD Pharmacy:   [facility]  [place], [place] [address] Phone: [phone] Fax: [phone]     Social Determinants of Health (SDOH) Interventions    Readmission Risk Interventions No flowsheet data found.

## 2019-09-29 NOTE — Progress Notes (Signed)
Nurse tech came to get this Probation officer, patient had copious amount of blood squirting from absess on left stump. Reinforced with ABD pads and gauze and tape and notified Physician Dr. Posey Pronto. Extremity is elevated at this time, notified Nurse " Jenny Reichmann" at same day surgery that this writer does not see informed consent in chart , or order for one

## 2019-09-29 NOTE — NC FL2 (Signed)
[place] LEVEL OF CARE SCREENING TOOL     IDENTIFICATION  Patient Name: Timothy Juarez Birthdate: 01/30/1951 Sex: male Admission Date (Current Location): 09/27/2019  [place] and [place] Number:  Engineering geologist and Address:  [facility], [address], [place], [place] [address]      Provider Number: [id number]  Attending Physician Name and Address:  Fritzi Mandes, MD  Relative Name and Phone Number:  Elmo Putt daughter [phone]    Current Level of Care: Hospital Recommended Level of Care: [place] Prior Approval Number:    Date Approved/Denied:   PASRR Number: [id number]  Discharge Plan: SNF    Current Diagnoses: Patient Active Problem List   Diagnosis Date Noted  . Cellulitis and abscess of leg 09/27/2019  . Infection of above knee amputation stump ([place]) 09/27/2019  . History of COVID-19 09/27/2019  . Abscess of left AKA stump 09/27/2019  . (HFpEF) heart failure with preserved ejection fraction ([place]) 09/27/2019  . COPD with chronic bronchitis ([place]) 09/27/2019  . Chronic anticoagulation with eliquis 09/27/2019  . Preoperative clearance 09/27/2019  . Acute on chronic respiratory failure with hypoxia ([place]) 12/27/2018  . Community acquired bilateral lower lobe pneumonia 12/27/2018  . COVID-19 virus infection 12/27/2018  . High anion gap metabolic acidosis 22/48/2500  . Severe sepsis ([place]) 12/27/2018  . AKI (acute kidney injury) ([place]) 12/27/2018  . Toxic encephalopathy 12/27/2018  . Acute respiratory failure with hypoxia ([place]) 12/27/2018  . Hx of AKA (above knee amputation), left ([place]) 11/02/2018  . Symptomatic anemia 10/10/2017  . Gastrointestinal hemorrhage   . Chronic deep vein thrombosis (DVT) of left lower extremity ([place])   . Iron deficiency anemia 04/21/2017  . Essential hypertension 03/13/2017  . Recurrent deep vein thrombosis (DVT) ([place]) 03/11/2017  . Hyperlipidemia 11/04/2016  . Pressure  ulcer 04/11/2016  . Pseudoaneurysm of left femoral artery ([place]) 04/11/2016  . PAD (peripheral artery disease) ([place]) 04/10/2016  . Atherosclerosis of native arteries of extremities with gangrene, left leg ([place]) 11/01/2015  . Ischemia of lower extremity 09/14/2015  . Atherosclerotic peripheral vascular disease with ulceration ([place]) 01/18/2015    Orientation RESPIRATION BLADDER Height & Weight     Self, Time, Place  Normal Continent Weight: 95.3 kg Height:  6\' 1"  (185.4 cm)  BEHAVIORAL SYMPTOMS/MOOD NEUROLOGICAL BOWEL NUTRITION STATUS      Continent    AMBULATORY STATUS COMMUNICATION OF NEEDS Skin   Extensive Assist Verbally Other (Comment)(abcess on knee stump)                       Personal Care Assistance Level of Assistance  Bathing, Dressing Bathing Assistance: Limited assistance   Dressing Assistance: Limited assistance     Functional Limitations Info             SPECIAL CARE FACTORS FREQUENCY  PT (By licensed PT)     PT Frequency: 5 times per week              Contractures Contractures Info: Not present    Additional Factors Info  Code Status, Allergies Code Status Info: full code Allergies Info: NKDA           Current Medications (09/29/2019):  This is the current hospital active medication list Current Facility-Administered Medications  Medication Dose Route Frequency Provider Last Rate Last Admin  . 0.9 %  sodium chloride infusion   Intravenous Continuous Fritzi Mandes, MD      . acetaminophen (TYLENOL) tablet 650 mg  650 mg Oral Q6H PRN Athena Masse, MD   650 mg at 09/28/19 2046   Or  . acetaminophen (TYLENOL) suppository 650 mg  650 mg Rectal Q6H PRN Athena Masse, MD      . acidophilus (RISAQUAD) capsule 1 capsule  1 capsule Oral BID Fritzi Mandes, MD   1 capsule at 09/28/19 2218  . atorvastatin (LIPITOR) tablet 10 mg  10 mg Oral QHS Fritzi Mandes, MD   10 mg at 09/28/19 2101  . baclofen (LIORESAL) tablet 10 mg  10 mg Oral TID Fritzi Mandes,  MD   10 mg at 09/28/19 2218  . brimonidine (ALPHAGAN) 0.2 % ophthalmic solution 1 drop  1 drop Right Eye BID Fritzi Mandes, MD   1 drop at 09/28/19 2048   And  . timolol (TIMOPTIC) 0.5 % ophthalmic solution 1 drop  1 drop Right Eye BID Fritzi Mandes, MD   1 drop at 09/28/19 2048  . docusate sodium (COLACE) capsule 100 mg  100 mg Oral Daily PRN Fritzi Mandes, MD      . fentaNYL (DURAGESIC) 12 MCG/HR 1 patch  1 patch Transdermal Q72H Fritzi Mandes, MD   1 patch at 09/28/19 1618  . ferrous sulfate tablet 324 mg  324 mg Oral BID Fritzi Mandes, MD   324 mg at 09/28/19 2101  . fluticasone furoate-vilanterol (BREO ELLIPTA) 100-25 MCG/INH 1 puff  1 puff Inhalation Daily Fritzi Mandes, MD   1 puff at 09/28/19 1616   And  . umeclidinium bromide (INCRUSE ELLIPTA) 62.5 MCG/INH 1 puff  1 puff Inhalation Daily Fritzi Mandes, MD   1 puff at 09/28/19 1616  . furosemide (LASIX) tablet 40 mg  40 mg Oral Daily Fritzi Mandes, MD   40 mg at 09/28/19 1617  . gabapentin (NEURONTIN) capsule 100 mg  100 mg Oral QHS Fritzi Mandes, MD   100 mg at 09/28/19 2101  . Melatonin TABS 5 mg  5 mg Oral QHS PRN Fritzi Mandes, MD      . omega-3 acid ethyl esters (LOVAZA) capsule 1,000 mg  1,000 mg Oral Daily Fritzi Mandes, MD   1,000 mg at 09/28/19 1617  . ondansetron (ZOFRAN) tablet 4 mg  4 mg Oral Q6H PRN Athena Masse, MD       Or  . ondansetron [facility]) injection 4 mg  4 mg Intravenous Q6H PRN Athena Masse, MD      . piperacillin-tazobactam (ZOSYN) IVPB 3.375 g  3.375 g Intravenous Q8H Judd Gaudier V, MD 12.5 mL/hr at 09/29/19 0600 3.375 g at 09/29/19 0600  . senna-docusate (Senokot-S) tablet 1 tablet  1 tablet Oral QHS PRN Athena Masse, MD      . vancomycin (VANCOCIN) IVPB 1000 mg/200 mL premix  1,000 mg Intravenous Q12H Athena Masse, MD 200 mL/hr at 09/29/19 0316 1,000 mg at 09/29/19 0316     Discharge Medications: Please see discharge summary for a list of discharge medications.  Relevant Imaging Results:  Relevant Lab  Results:   Additional Information SSN: [id number]  Su Hilt, RN

## 2019-09-29 NOTE — H&P (Signed)
[facility] History & Physical Update  The patient was interviewed and re-examined.  The patient's previous History and Physical has been reviewed and is unchanged.  There is no change in the plan of care. We plan to proceed with the scheduled procedure.  [name], MD  09/29/2019, 12:32 PM

## 2019-09-29 NOTE — Progress Notes (Signed)
Patient transferred to one day surgery and is off unit at this time

## 2019-09-29 NOTE — Transfer of Care (Signed)
Immediate Anesthesia Transfer of Care Note  Patient: Timothy Juarez  Procedure(s) Performed: IRRIGATION AND DEBRIDEMENT OF LEFT AKA (Left Knee)  Patient Location: PACU  Anesthesia Type:General  Level of Consciousness: awake, alert  and oriented  Airway & Oxygen Therapy: Patient Spontanous Breathing and Patient connected to face mask oxygen  Post-op Assessment: Report given to RN and Post -op Vital signs reviewed and stable  Post vital signs: Reviewed and stable  Last Vitals:  Vitals Value Taken Time  BP 132/87 09/29/19 1430  Temp 36.8 C 09/29/19 1430  Pulse 137 09/29/19 1438  Resp 13 09/29/19 1438  SpO2 100 % 09/29/19 1438  Vitals shown include unvalidated device data.  Last Pain:  Vitals:   09/29/19 1218  TempSrc: Tympanic  PainSc: 0-No pain      Patients Stated Pain Goal: 0 (38/17/71 1657)  Complications: No apparent anesthesia complications

## 2019-09-29 NOTE — Consult Note (Signed)
NAME: Timothy Juarez  DOB: 1951-08-06  MRN: [id number]  Date/Time: 09/29/2019 5:37 PM  REQUESTING PROVIDER: Dr.Patel Subjective:  REASON FOR CONSULT: left AKA stump infection ? Timothy Juarez is a 69 y.o. male with a history of LEFt AKA in March 2017 presented to Dr.Dew on 2/9 with swelling and pain at the site of the stump going on for a few weeks. He has PAD and has Viabahn stents in the SFA . He was asked to get admitted for surgery and today underwent  I/D of the absces sat the stump site. As per Dr.Dew's note he had a very large cavity  the size of a basketball with raw tissue and oozing purulent stuff. He had 500cc bloodc loss. Several of the SFA stents were removed. Cultures sent- pt is in ICU now on vanco and zosyn and I am asked to see the patient for the same. Pt is in [facility] for the past 4 years Pt is a limited historian  Past Medical History:  Diagnosis Date  . Acute embolism and thombos unsp deep vn unsp lower extremity ([place])   . Acute respiratory failure ([place])   . Allergy   . Anemia   . Aneurysm of unspecified site ([place])   . ARF (acute respiratory failure) ([place])    H/O  . Bronchitis   . CHF (congestive heart failure) ([place])   . COPD (chronic obstructive pulmonary disease) ([place])   . Cough   . Epistaxis   . GERD (gastroesophageal reflux disease)   . Gout   . Hyperlipidemia   . Hypertension   . Hypokalemia   . Insomnia   . Muscle contracture    MUSCLE SPASMS, muscle weakness  . Peripheral vascular disease ([place])   . Pneumonia   . Pressure ulcer     Past Surgical History:  Procedure Laterality Date  . AMPUTATION Left 09/19/2015   Procedure: AMPUTATION BELOW KNEE;  Surgeon: Algernon Huxley, MD;  Location: [facility] ORS;  Service: Vascular;  Laterality: Left;  . AMPUTATION Left 11/01/2015   Procedure: AMPUTATION ABOVE KNEE;  Surgeon: Algernon Huxley, MD;  Location: [facility] ORS;  Service: Vascular;  Laterality: Left;  . APPLICATION OF WOUND VAC Left 10/18/2015   Procedure:  APPLICATION OF WOUND VAC;  Surgeon: Algernon Huxley, MD;  Location: [facility] ORS;  Service: Vascular;  Laterality: Left;  . CATARACT EXTRACTION W/PHACO Right 08/18/2019   Procedure: CATARACT EXTRACTION PHACO AND INTRAOCULAR LENS PLACEMENT ([place]) RIGHT;  Surgeon: Birder Robson, MD;  Location: [facility] ORS;  Service: Ophthalmology;  Laterality: Right;  [place] 03:29.0 CDE 45.68 Fluid Pack Lot # [id number]  . COLONOSCOPY WITH PROPOFOL N/A 05/25/2017   Procedure: COLONOSCOPY WITH PROPOFOL;  Surgeon: Jonathon Bellows, MD;  Location: [facility] ENDOSCOPY;  Service: Gastroenterology;  Laterality: N/A;  . COLONOSCOPY WITH PROPOFOL N/A 10/14/2017   Procedure: COLONOSCOPY WITH PROPOFOL;  Surgeon: Jonathon Bellows, MD;  Location: [facility] ENDOSCOPY;  Service: Gastroenterology;  Laterality: N/A;  . ESOPHAGOGASTRODUODENOSCOPY (EGD) WITH PROPOFOL N/A 05/25/2017   Procedure: ESOPHAGOGASTRODUODENOSCOPY (EGD) WITH PROPOFOL;  Surgeon: Jonathon Bellows, MD;  Location: [facility] ENDOSCOPY;  Service: Gastroenterology;  Laterality: N/A;  . ESOPHAGOGASTRODUODENOSCOPY (EGD) WITH PROPOFOL N/A 10/14/2017   Procedure: ESOPHAGOGASTRODUODENOSCOPY (EGD) WITH PROPOFOL;  Surgeon: Jonathon Bellows, MD;  Location: [facility] ENDOSCOPY;  Service: Gastroenterology;  Laterality: N/A;  . EYE SURGERY    . GIVENS CAPSULE STUDY N/A 07/14/2017   Procedure: GIVENS CAPSULE STUDY;  Surgeon: Jonathon Bellows, MD;  Location: [facility] ENDOSCOPY;  Service: Gastroenterology;  Laterality: N/A;  .  GIVENS CAPSULE STUDY N/A 10/30/2017   Procedure: GIVENS CAPSULE STUDY 12 HR;  Surgeon: Jonathon Bellows, MD;  Location: [facility] ENDOSCOPY;  Service: Gastroenterology;  Laterality: N/A;  . IVC FILTER INSERTION N/A 10/12/2017   Procedure: IVC FILTER INSERTION;  Surgeon: Algernon Huxley, MD;  Location: [place] CV LAB;  Service: Cardiovascular;  Laterality: N/A;  . LOWER EXTREMITY ANGIOGRAPHY Right 10/04/2018   Procedure: LOWER EXTREMITY ANGIOGRAPHY;  Surgeon: Algernon Huxley, MD;  Location: [place] CV LAB;  Service:  Cardiovascular;  Laterality: Right;  . PERIPHERAL VASCULAR CATHETERIZATION Left 01/18/2015   Procedure: Lower Extremity Angiography;  Surgeon: Algernon Huxley, MD;  Location: [place] CV LAB;  Service: Cardiovascular;  Laterality: Left;  . PERIPHERAL VASCULAR CATHETERIZATION N/A 01/18/2015   Procedure: Lower Extremity Intervention;  Surgeon: Algernon Huxley, MD;  Location: [place] CV LAB;  Service: Cardiovascular;  Laterality: N/A;  . PERIPHERAL VASCULAR CATHETERIZATION  07/30/2015   Procedure: Lower Extremity Intervention;  Surgeon: Algernon Huxley, MD;  Location: [place] CV LAB;  Service: Cardiovascular;;  . PERIPHERAL VASCULAR CATHETERIZATION N/A 07/30/2015   Procedure: Abdominal Aortogram w/Lower Extremity;  Surgeon: Algernon Huxley, MD;  Location: [place] CV LAB;  Service: Cardiovascular;  Laterality: N/A;  . PERIPHERAL VASCULAR CATHETERIZATION Left 08/22/2015   Procedure: Lower Extremity Angiography;  Surgeon: Algernon Huxley, MD;  Location: [place] CV LAB;  Service: Cardiovascular;  Laterality: Left;  . PERIPHERAL VASCULAR CATHETERIZATION Left 08/22/2015   Procedure: Lower Extremity Intervention;  Surgeon: Algernon Huxley, MD;  Location: [place] CV LAB;  Service: Cardiovascular;  Laterality: Left;  . PERIPHERAL VASCULAR CATHETERIZATION Right 03/31/2016   Procedure: Lower Extremity Angiography;  Surgeon: Algernon Huxley, MD;  Location: [place] CV LAB;  Service: Cardiovascular;  Laterality: Right;  . PERIPHERAL VASCULAR CATHETERIZATION  03/31/2016   Procedure: Lower Extremity Intervention;  Surgeon: Algernon Huxley, MD;  Location: [place] CV LAB;  Service: Cardiovascular;;  . PERIPHERAL VASCULAR CATHETERIZATION Left 04/10/2016   Procedure: Lower Extremity Angiography;  Surgeon: Algernon Huxley, MD;  Location: [place] CV LAB;  Service: Cardiovascular;  Laterality: Left;  . WOUND DEBRIDEMENT Left 10/18/2015   Procedure: DEBRIDEMENT WOUND   ( LEFT BKA DEBRIDEMENT );  Surgeon: Algernon Huxley,  MD;  Location: [facility] ORS;  Service: Vascular;  Laterality: Left;    Social History   Socioeconomic History  . Marital status: Legally Separated    Spouse name: Not on file  . Number of children: Not on file  . Years of education: Not on file  . Highest education level: Not on file  Occupational History  . Not on file  Tobacco Use  . Smoking status: Former Smoker    Packs/day: 1.00    Years: 44.00    Pack years: 44.00    Types: Cigarettes    Quit date: 11/17/2012    Years since quitting: 6.8  . Smokeless tobacco: Never Used  Substance and Sexual Activity  . Alcohol use: No  . Drug use: No  . Sexual activity: Never  Other Topics Concern  . Not on file  Social History Narrative  . Not on file   Social Determinants of Health   Financial Resource Strain:   . Difficulty of Paying Living Expenses: Not on file  Food Insecurity:   . Worried About Charity fundraiser in the Last Year: Not on file  . Ran Out of Food in the Last Year: Not on file  Transportation Needs:   .  Lack of Transportation (Medical): Not on file  . Lack of Transportation (Non-Medical): Not on file  Physical Activity:   . Days of Exercise per Week: Not on file  . Minutes of Exercise per Session: Not on file  Stress:   . Feeling of Stress : Not on file  Social Connections:   . Frequency of Communication with Friends and Family: Not on file  . Frequency of Social Gatherings with Friends and Family: Not on file  . Attends Religious Services: Not on file  . Active Member of Clubs or Organizations: Not on file  . Attends Archivist Meetings: Not on file  . Marital Status: Not on file  Intimate Partner Violence:   . Fear of Current or Ex-Partner: Not on file  . Emotionally Abused: Not on file  . Physically Abused: Not on file  . Sexually Abused: Not on file    Family History  Problem Relation Age of Onset  . Heart attack Mother   . Varicose Veins Neg Hx    No Known Allergies  ? Current  Facility-Administered Medications  Medication Dose Route Frequency Provider Last Rate Last Admin  . 0.9 %  sodium chloride infusion   Intravenous Continuous Algernon Huxley, MD 75 mL/hr at 09/29/19 1613 New Bag at 09/29/19 1613  . acetaminophen (TYLENOL) tablet 650 mg  650 mg Oral Q6H PRN Algernon Huxley, MD   650 mg at 09/28/19 2046   Or  . acetaminophen (TYLENOL) suppository 650 mg  650 mg Rectal Q6H PRN Algernon Huxley, MD      . acidophilus (RISAQUAD) capsule 1 capsule  1 capsule Oral BID Algernon Huxley, MD   1 capsule at 09/28/19 2218  . atorvastatin (LIPITOR) tablet 10 mg  10 mg Oral QHS Algernon Huxley, MD   10 mg at 09/28/19 2101  . baclofen (LIORESAL) tablet 10 mg  10 mg Oral TID Algernon Huxley, MD   10 mg at 09/28/19 2218  . brimonidine (ALPHAGAN) 0.2 % ophthalmic solution 1 drop  1 drop Right Eye BID Algernon Huxley, MD   1 drop at 09/29/19 1038   And  . timolol (TIMOPTIC) 0.5 % ophthalmic solution 1 drop  1 drop Right Eye BID Algernon Huxley, MD   1 drop at 09/29/19 1039  . [START ON 09/30/2019] ceFAZolin (ANCEF) IVPB 2g/100 mL premix  2 g Intravenous On Call to [place], Joelene Millin A, PA-C      . chlorhexidine (PERIDEX) 0.12 % solution 15 mL  15 mL Mouth Rinse BID Fritzi Mandes, MD      . Chlorhexidine Gluconate Cloth 2 % PADS 6 each  6 each Topical Daily Fritzi Mandes, MD      . docusate sodium (COLACE) capsule 100 mg  100 mg Oral Daily PRN Algernon Huxley, MD      . fentaNYL (DURAGESIC) 12 MCG/HR 1 patch  1 patch Transdermal Q72H Algernon Huxley, MD   1 patch at 09/28/19 1618  . ferrous sulfate tablet 324 mg  324 mg Oral BID Algernon Huxley, MD   324 mg at 09/28/19 2101  . fluticasone furoate-vilanterol (BREO ELLIPTA) 100-25 MCG/INH 1 puff  1 puff Inhalation Daily Algernon Huxley, MD   1 puff at 09/29/19 1038   And  . umeclidinium bromide (INCRUSE ELLIPTA) 62.5 MCG/INH 1 puff  1 puff Inhalation Daily Algernon Huxley, MD   1 puff at 09/29/19 1038  . furosemide (LASIX) tablet 40 mg  40 mg Oral Daily Algernon Huxley, MD   40  mg at 09/28/19 1617  . gabapentin (NEURONTIN) capsule 100 mg  100 mg Oral QHS Algernon Huxley, MD   100 mg at 09/28/19 2101  . MEDLINE mouth rinse  15 mL Mouth Rinse q12n4p Fritzi Mandes, MD      . Melatonin TABS 5 mg  5 mg Oral QHS PRN Algernon Huxley, MD      . morphine 2 MG/ML injection 2 mg  2 mg Intravenous Q4H PRN Fritzi Mandes, MD   2 mg at 09/29/19 1635  . omega-3 acid ethyl esters (LOVAZA) capsule 1,000 mg  1,000 mg Oral Daily Algernon Huxley, MD   1,000 mg at 09/28/19 1617  . ondansetron (ZOFRAN) tablet 4 mg  4 mg Oral Q6H PRN Algernon Huxley, MD       Or  . ondansetron (ZOFRAN) injection 4 mg  4 mg Intravenous Q6H PRN Algernon Huxley, MD   4 mg at 09/29/19 1334  . phenylephrine (NEO-SYNEPHRINE) 10 mg in sodium chloride 0.9 % 250 mL (0.04 mg/mL) infusion  0-400 mcg/min Intravenous Titrated Martha Clan, MD 30 mL/hr at 09/29/19 1502 20 mcg/min at 09/29/19 1502  . piperacillin-tazobactam (ZOSYN) IVPB 3.375 g  3.375 g Intravenous Q8H Algernon Huxley, MD 12.5 mL/hr at 09/29/19 1518 Restarted at 09/29/19 1518  . senna-docusate (Senokot-S) tablet 1 tablet  1 tablet Oral QHS PRN Algernon Huxley, MD      . vancomycin (VANCOCIN) IVPB 1000 mg/200 mL premix  1,000 mg Intravenous Q12H Algernon Huxley, MD 200 mL/hr at 09/29/19 0316 1,000 mg at 09/29/19 0316     Abtx:  Anti-infectives (From admission, onward)   Start     Dose/Rate Route Frequency Ordered Stop   09/30/19 0600  ceFAZolin (ANCEF) IVPB 2g/100 mL premix     2 g 200 mL/hr over 30 Minutes Intravenous On call to O.R. 09/29/19 1610 10/01/19 0559   09/29/19 0200  vancomycin (VANCOREADY) IVPB 2000 mg/400 mL  Status:  Discontinued     2,000 mg 200 mL/hr over 120 Minutes Intravenous Every 24 hours 09/28/19 0131 09/28/19 0350   09/28/19 1500  vancomycin (VANCOCIN) IVPB 1000 mg/200 mL premix     1,000 mg 200 mL/hr over 60 Minutes Intravenous Every 12 hours 09/28/19 0350     09/28/19 0600  piperacillin-tazobactam (ZOSYN) IVPB 3.375 g     3.375 g 12.5 mL/hr over 240  Minutes Intravenous Every 8 hours 09/28/19 0044     09/28/19 0045  vancomycin (VANCOCIN) IVPB 1000 mg/200 mL premix     1,000 mg 200 mL/hr over 60 Minutes Intravenous  Once 09/28/19 0044 09/28/19 0419   09/27/19 2200  vancomycin (VANCOCIN) IVPB 1000 mg/200 mL premix     1,000 mg 200 mL/hr over 60 Minutes Intravenous  Once 09/27/19 2153 09/27/19 2356   09/27/19 2200  piperacillin-tazobactam (ZOSYN) IVPB 3.375 g     3.375 g 100 mL/hr over 30 Minutes Intravenous  Once 09/27/19 2153 09/27/19 2232      REVIEW OF SYSTEMS:  Const: negative fever, negative chills, negative weight loss Eyes: negative diplopia or visual changes, negative eye pain ENT: negative coryza, negative sore throat Resp: negative cough, hemoptysis, dyspnea Cards: negative for chest pain, palpitations, lower extremity edema GU: negative for frequency, dysuria and hematuria GI: Negative for abdominal pain, diarrhea, bleeding, constipation Skin: negative for rash and pruritus Heme: negative for easy bruising and gum/nose bleeding MS: pain and swelling and bloody  discharge from left stump Neurolo:negative for headaches, dizziness, vertigo, memory problems  Psych: negative for feelings of anxiety, depression  Endocrine: negative for thyroid, diabetes Allergy/Immunology- negative for any medication or food allergies  Objective:  VITALS:  BP 98/70 (BP Location: Right Arm)   Pulse (!) 118   Temp 97.8 F (36.6 C) (Axillary)   Resp 16   Ht 6' 0.99" (1.854 m)   Wt 95.3 kg   SpO2 99%   BMI 27.71 kg/m  PHYSICAL EXAM:  General: Alert, cooperative, no distress,   Head: Normocephalic, without obvious abnormality, atraumatic. Eyes: Conjunctivae clear, anicteric sclerae. Pupils are equal ENT Nares normal. No drainage or sinus tenderness. Oral cavity- poor dentition  Neck: Supple, symmetrical, no adenopathy, thyroid: non tender no carotid bruit and no JVD. Lungs: b/l air entry Heart: s1s2 Abdomen: Soft,  Extremities:  left AKA stump surgical dressing Rt foot- 2nd toe amputated  Skin: No rashes or lesions. Or bruising Lymph: Cervical, supraclavicular normal. Neurologic: Grossly non-focal Pertinent Labs Lab Results CBC    Component Value Date/Time   WBC 18.1 (H) 09/29/2019 1445   RBC 4.03 (L) 09/29/2019 1445   HGB 9.7 (L) 09/29/2019 1445   HGB 14.3 12/01/2013 1317   HCT 32.5 (L) 09/29/2019 1445   HCT 43.9 12/01/2013 1317   PLT 478 (H) 09/29/2019 1445   PLT 250 12/01/2013 1317   MCV 80.6 09/29/2019 1445   MCV 84 12/01/2013 1317   MCH 24.1 (L) 09/29/2019 1445   MCHC 29.8 (L) 09/29/2019 1445   RDW 16.4 (H) 09/29/2019 1445   RDW 13.9 12/01/2013 1317   LYMPHSABS 2.5 09/27/2019 2143   LYMPHSABS 3.2 04/03/2013 0529   MONOABS 1.2 (H) 09/27/2019 2143   MONOABS 1.3 (H) 04/03/2013 0529   EOSABS 0.1 09/27/2019 2143   EOSABS 0.2 04/03/2013 0529   BASOSABS 0.1 09/27/2019 2143   BASOSABS 0.1 04/03/2013 0529    CMP Latest Ref Rng & Units 09/29/2019 09/28/2019 09/27/2019  Glucose 70 - 99 mg/dL 108(H) 112(H) 111(H)  BUN 8 - 23 mg/dL 10 14 15   Creatinine 0.61 - 1.24 mg/dL 1.15 1.14 1.26(H)  Sodium 135 - 145 mmol/L 139 137 138  Potassium 3.5 - 5.1 mmol/L 3.0(L) 3.2(L) 3.5  Chloride 98 - 111 mmol/L 104 103 103  CO2 22 - 32 mmol/L 27 25 24   Calcium 8.9 - 10.3 mg/dL 8.2(L) 8.3(L) 8.7(L)  Total Protein 6.5 - 8.1 g/dL - - 7.6  Total Bilirubin 0.3 - 1.2 mg/dL - - 0.9  Alkaline Phos 38 - 126 U/L - - 122  AST 15 - 41 U/L - - 18  ALT 0 - 44 U/L - - 13      Microbiology: Recent Results (from the past 240 hour(s))  Blood Culture (routine x 2)     Status: None (Preliminary result)   Collection Time: 09/27/19  9:43 PM   Specimen: BLOOD  Result Value Ref Range Status   Specimen Description BLOOD LEFT HAND  Final   Special Requests   Final    BOTTLES DRAWN AEROBIC AND ANAEROBIC Blood Culture adequate volume   Culture   Final    NO GROWTH 2 DAYS Performed at [facility], [address],  [place], [place] [address]    Report Status PENDING  Incomplete  Aerobic/Anaerobic Culture (surgical/deep wound)     Status: None (Preliminary result)   Collection Time: 09/27/19  9:43 PM   Specimen: Leg; Abscess  Result Value Ref Range Status   Specimen Description  Final    LEG LEFT Performed at [facility], [address] [place], [place] [address]    Special Requests   Final    NONE Performed at [facility], [place], [place] [address]    Gram Stain   Final    RARE WBC PRESENT, PREDOMINANTLY PMN RARE GRAM POSITIVE RODS    Culture   Final    CULTURE REINCUBATED FOR BETTER GROWTH Performed at [facility], [place] [address] [place], [place] [address]    Report Status PENDING  Incomplete  Blood Culture (routine x 2)     Status: None (Preliminary result)   Collection Time: 09/27/19 10:33 PM   Specimen: BLOOD  Result Value Ref Range Status   Specimen Description BLOOD RIGHT FA  Final   Special Requests   Final    BOTTLES DRAWN AEROBIC AND ANAEROBIC Blood Culture adequate volume   Culture   Final    NO GROWTH 2 DAYS Performed at [facility], [address], [place], [place] [address]    Report Status PENDING  Incomplete  Respiratory Panel by RT PCR (Flu A&B, Covid) - Nasopharyngeal Swab     Status: None   Collection Time: 09/27/19 11:20 PM   Specimen: Nasopharyngeal Swab  Result Value Ref Range Status   SARS Coronavirus 2 by RT PCR NEGATIVE NEGATIVE Final    Comment: (NOTE) SARS-CoV-2 target nucleic acids are NOT DETECTED. The SARS-CoV-2 RNA is generally detectable in upper respiratoy specimens during the acute phase of infection. The lowest concentration of SARS-CoV-2 viral copies this assay can detect is 131 copies/mL. A negative result does not preclude SARS-Cov-2 infection and should not be used as the sole basis for treatment or other patient management decisions. A negative result may occur with  improper specimen  collection/handling, submission of specimen other than nasopharyngeal swab, presence of viral mutation(s) within the areas targeted by this assay, and inadequate number of viral copies (<131 copies/mL). A negative result must be combined with clinical observations, patient history, and epidemiological information. The expected result is Negative. Fact Sheet for Patients:  PinkCheek.be Fact Sheet for Healthcare Providers:  GravelBags.it This test is not yet ap proved or cleared by the [place] FDA and  has been authorized for detection and/or diagnosis of SARS-CoV-2 by FDA under an Emergency Use Authorization (EUA). This EUA will remain  in effect (meaning this test can be used) for the duration of the COVID-19 declaration under Section 564(b)(1) of the Act, 21 U.S.C. section 360bbb-3(b)(1), unless the authorization is terminated or revoked sooner.    Influenza A by PCR NEGATIVE NEGATIVE Final   Influenza B by PCR NEGATIVE NEGATIVE Final    Comment: (NOTE) The Xpert Xpress SARS-CoV-2/FLU/RSV assay is intended as an aid in  the diagnosis of influenza from Nasopharyngeal swab specimens and  should not be used as a sole basis for treatment. Nasal washings and  aspirates are unacceptable for Xpert Xpress SARS-CoV-2/FLU/RSV  testing. Fact Sheet for Patients: PinkCheek.be Fact Sheet for Healthcare Providers: GravelBags.it This test is not yet approved or cleared by the [place] FDA and  has been authorized for detection and/or diagnosis of SARS-CoV-2 by  FDA under an Emergency Use Authorization (EUA). This EUA will remain  in effect (meaning this test can be used) for the duration of the  Covid-19 declaration under Section 564(b)(1) of the Act, 21  U.S.C. section 360bbb-3(b)(1), unless the authorization is  terminated or revoked. Performed at [facility],  [place], [place] [address]   MRSA PCR Screening     Status: None   Collection Time: 09/28/19  5:55 AM   Specimen: Nasopharyngeal  Result Value Ref Range Status   MRSA by PCR NEGATIVE NEGATIVE Final    Comment:        The GeneXpert MRSA Assay (FDA approved for NASAL specimens only), is one component of a comprehensive MRSA colonization surveillance program. It is not intended to diagnose MRSA infection nor to guide or monitor treatment for MRSA infections. Performed at [facility], [place], [place] [address]     IMAGING RESULTS: collection of the left thigh, measuring 10.4 x 17.1 x 6.2 cm, likely a combination of hematoma and abscess. The collection extends to the medial thigh skin surface. 2. No osteolysis or other bony irregularity. I have personally reviewed the films ? Impression/Recommendation ? Abscess at the site of the left AKA stump four years after surgery- stents in the left SFA very likely infected as well and he has undergone I/D and removal of stents and drainage of the abscess- await culture result- Currently on vanco and zosyn- will -deescalate soon- watch creatinine closely.   Anemia- some blood loss from the infected stump-  Getting PRBC  Hypotension on neo- likely a combination of blood loss and infection  AKI-improving  PAD   HFpEF- management as per primary team ___________________________________________________ Discussed with patient, requesting provider

## 2019-09-29 NOTE — Anesthesia Procedure Notes (Signed)
Procedure Name: LMA Insertion Date/Time: 09/29/2019 1:30 PM Performed by: Nelda Marseille, CRNA Pre-anesthesia Checklist: Patient identified, Patient being monitored, Timeout performed, Emergency Drugs available and Suction available Patient Re-evaluated:Patient Re-evaluated prior to induction Oxygen Delivery Method: Circle system utilized Preoxygenation: Pre-oxygenation with 100% oxygen Induction Type: IV induction Ventilation: Mask ventilation without difficulty LMA: LMA inserted LMA Size: 4.0 Tube type: Oral Number of attempts: 1 Placement Confirmation: positive ETCO2 and breath sounds checked- equal and bilateral Tube secured with: Tape Dental Injury: Teeth and Oropharynx as per pre-operative assessment

## 2019-09-30 ENCOUNTER — Inpatient Hospital Stay: Payer: Medicare Other

## 2019-09-30 ENCOUNTER — Inpatient Hospital Stay: Payer: Medicare Other | Admitting: Anesthesiology

## 2019-09-30 ENCOUNTER — Encounter
Admission: EM | Disposition: A | Discharge status: Skilled Nursing Facility | Payer: Self-pay | Source: Home / Self Care | Attending: Internal Medicine

## 2019-09-30 DIAGNOSIS — N179 Acute kidney failure, unspecified: Secondary | ICD-10-CM | POA: Diagnosis not present

## 2019-09-30 DIAGNOSIS — T8744 Infection of amputation stump, left lower extremity: Secondary | ICD-10-CM | POA: Diagnosis not present

## 2019-09-30 DIAGNOSIS — L0291 Cutaneous abscess, unspecified: Secondary | ICD-10-CM | POA: Diagnosis not present

## 2019-09-30 DIAGNOSIS — A419 Sepsis, unspecified organism: Secondary | ICD-10-CM

## 2019-09-30 DIAGNOSIS — L02416 Cutaneous abscess of left lower limb: Secondary | ICD-10-CM

## 2019-09-30 DIAGNOSIS — J449 Chronic obstructive pulmonary disease, unspecified: Secondary | ICD-10-CM | POA: Diagnosis not present

## 2019-09-30 DIAGNOSIS — I5030 Unspecified diastolic (congestive) heart failure: Secondary | ICD-10-CM | POA: Diagnosis not present

## 2019-09-30 HISTORY — PX: CENTRAL VENOUS CATHETER INSERTION: SHX401

## 2019-09-30 HISTORY — PX: APPLICATION OF WOUND VAC: SHX5189

## 2019-09-30 LAB — BASIC METABOLIC PANEL
Anion gap: 5 (ref 5–15)
BUN: 13 mg/dL (ref 8–23)
CO2: 26 mmol/L (ref 22–32)
Calcium: 7.9 mg/dL — ABNORMAL LOW (ref 8.9–10.3)
Chloride: 107 mmol/L (ref 98–111)
Creatinine, Ser: 1.16 mg/dL (ref 0.61–1.24)
GFR calc Af Amer: >60 mL/min (ref >60)
GFR calc non Af Amer: >60 mL/min (ref >60)
Glucose, Bld: 139 mg/dL — ABNORMAL HIGH (ref 70–99)
Potassium: 4 mmol/L (ref 3.5–5.1)
Sodium: 138 mmol/L (ref 135–145)

## 2019-09-30 LAB — CBC
HCT: 25.2 % — ABNORMAL LOW (ref 39.0–52.0)
Hemoglobin: 7.7 g/dL — ABNORMAL LOW (ref 13.0–17.0)
MCH: 23.8 pg — ABNORMAL LOW (ref 26.0–34.0)
MCHC: 30.6 g/dL (ref 30.0–36.0)
MCV: 77.8 fL — ABNORMAL LOW (ref 80.0–100.0)
Platelets: 439 10*3/uL — ABNORMAL HIGH (ref 150–400)
RBC: 3.24 MIL/uL — ABNORMAL LOW (ref 4.22–5.81)
RDW: 16.2 % — ABNORMAL HIGH (ref 11.5–15.5)
WBC: 16.9 10*3/uL — ABNORMAL HIGH (ref 4.0–10.5)
nRBC: 0 % (ref 0.0–0.2)

## 2019-09-30 LAB — HEMOGLOBIN
Hemoglobin: 8.7 g/dL — ABNORMAL LOW (ref 13.0–17.0)
Hemoglobin: 8.5 g/dL — ABNORMAL LOW (ref 13.0–17.0)

## 2019-09-30 LAB — MAGNESIUM: Magnesium: 2.1 mg/dL (ref 1.7–2.4)

## 2019-09-30 SURGERY — APPLICATION, WOUND VAC
Anesthesia: General | Site: Neck | Laterality: Right

## 2019-09-30 MED ORDER — LIDOCAINE HCL (CARDIAC) PF 100 MG/5ML IV SOSY
PREFILLED_SYRINGE | INTRAVENOUS | Status: DC | PRN
  Administered 2019-09-30: 100 mg via INTRAVENOUS

## 2019-09-30 MED ORDER — FENTANYL CITRATE (PF) 100 MCG/2ML IJ SOLN
INTRAMUSCULAR | Status: AC
  Filled 2019-09-30: qty 2

## 2019-09-30 MED ORDER — ONDANSETRON HCL 4 MG/2ML IJ SOLN
INTRAMUSCULAR | Status: DC | PRN
  Administered 2019-09-30: 4 mg via INTRAVENOUS

## 2019-09-30 MED ORDER — LACTATED RINGERS IV SOLN: INTRAVENOUS | Status: DC | PRN

## 2019-09-30 MED ORDER — SODIUM CHLORIDE 0.9% IV SOLUTION: Freq: Once | INTRAVENOUS | Status: AC

## 2019-09-30 MED ORDER — GLYCOPYRROLATE 0.2 MG/ML IJ SOLN
INTRAMUSCULAR | Status: DC | PRN
  Administered 2019-09-30: .2 mg via INTRAVENOUS

## 2019-09-30 MED ORDER — IPRATROPIUM-ALBUTEROL 0.5-2.5 (3) MG/3ML IN SOLN
3.0000 mL | Freq: Three times a day (TID) | RESPIRATORY_TRACT | Status: DC
  Administered 2019-10-01 – 2019-10-04 (×10): 3 mL via RESPIRATORY_TRACT
  Filled 2019-09-30 (×11): qty 3

## 2019-09-30 MED ORDER — IPRATROPIUM-ALBUTEROL 0.5-2.5 (3) MG/3ML IN SOLN
3.0000 mL | Freq: Four times a day (QID) | RESPIRATORY_TRACT | Status: DC
  Administered 2019-09-30 (×2): 3 mL via RESPIRATORY_TRACT
  Filled 2019-09-30 (×2): qty 3

## 2019-09-30 MED ORDER — PROPOFOL 10 MG/ML IV BOLUS
INTRAVENOUS | Status: AC
  Filled 2019-09-30: qty 40

## 2019-09-30 MED ORDER — NOREPINEPHRINE 4 MG/250ML-% IV SOLN
0.0000 ug/min | INTRAVENOUS | Status: DC
  Administered 2019-09-30: 15 ug/min via INTRAVENOUS
  Administered 2019-09-30: 9 ug/min via INTRAVENOUS
  Administered 2019-09-30: 6 ug/min via INTRAVENOUS
  Administered 2019-10-01: 8 ug/min via INTRAVENOUS
  Administered 2019-10-01: 5 ug/min via INTRAVENOUS
  Administered 2019-10-01: 10 ug/min via INTRAVENOUS
  Administered 2019-10-02: 8 ug/min via INTRAVENOUS
  Administered 2019-10-02: 4 ug/min via INTRAVENOUS
  Filled 2019-09-30 (×7): qty 250

## 2019-09-30 MED ORDER — EPHEDRINE SULFATE 50 MG/ML IJ SOLN
INTRAMUSCULAR | Status: AC
  Filled 2019-09-30: qty 1

## 2019-09-30 MED ORDER — PROPOFOL 10 MG/ML IV BOLUS
INTRAVENOUS | Status: DC | PRN
  Administered 2019-09-30: 80 mg via INTRAVENOUS

## 2019-09-30 MED ORDER — FENTANYL CITRATE (PF) 100 MCG/2ML IJ SOLN
INTRAMUSCULAR | Status: DC | PRN
  Administered 2019-09-30 (×2): 50 ug via INTRAVENOUS
  Administered 2019-09-30 (×2): 25 ug via INTRAVENOUS
  Administered 2019-09-30: 50 ug via INTRAVENOUS

## 2019-09-30 MED ORDER — PANTOPRAZOLE SODIUM 40 MG IV SOLR
40.0000 mg | INTRAVENOUS | Status: DC
  Administered 2019-09-30 – 2019-10-02 (×3): 40 mg via INTRAVENOUS
  Filled 2019-09-30 (×4): qty 40

## 2019-09-30 MED ORDER — SODIUM CHLORIDE 0.9 % IV SOLN: INTRAVENOUS | Status: DC | PRN

## 2019-09-30 SURGICAL SUPPLY — 36 items
APL PRP STRL LF DISP 70% ISPRP (MISCELLANEOUS) ×2
BNDG COHESIVE 6X5 TAN STRL LF (GAUZE/BANDAGES/DRESSINGS) ×4 IMPLANT
BNDG CONFORM 2 STRL LF (GAUZE/BANDAGES/DRESSINGS) ×2 IMPLANT
BRUSH SCRUB EZ  4% CHG (MISCELLANEOUS) ×2
BRUSH SCRUB EZ 4% CHG (MISCELLANEOUS) ×2 IMPLANT
CANISTER SUCT 1200ML W/VALVE (MISCELLANEOUS) ×4 IMPLANT
CANISTER WOUND CARE 500ML ATS (WOUND CARE) ×4 IMPLANT
CHLORAPREP W/TINT 26 (MISCELLANEOUS) ×4 IMPLANT
COVER WAND RF STERILE (DRAPES) ×4 IMPLANT
DRAPE INCISE IOBAN 66X45 STRL (DRAPES) ×2 IMPLANT
DRSG EMULSION OIL 3X3 NADH (GAUZE/BANDAGES/DRESSINGS) ×4 IMPLANT
DRSG MEPITEL 4X7.2 (GAUZE/BANDAGES/DRESSINGS) ×4 IMPLANT
DRSG VAC ATS MED SENSATRAC (GAUZE/BANDAGES/DRESSINGS) ×2 IMPLANT
DURAPREP 26ML APPLICATOR (WOUND CARE) ×4 IMPLANT
ELECT REM PT RETURN 9FT ADLT (ELECTROSURGICAL) ×4
ELECTRODE REM PT RTRN 9FT ADLT (ELECTROSURGICAL) ×2 IMPLANT
GAUZE SPONGE 4X4 12PLY STRL (GAUZE/BANDAGES/DRESSINGS) ×4 IMPLANT
GLOVE SURG SYN 8.0 (GLOVE) ×4 IMPLANT
GLOVE SURG SYN 8.0 PF PI (GLOVE) ×2 IMPLANT
GOWN STRL REUS W/ TWL LRG LVL3 (GOWN DISPOSABLE) ×4 IMPLANT
GOWN STRL REUS W/ TWL XL LVL3 (GOWN DISPOSABLE) ×2 IMPLANT
GOWN STRL REUS W/TWL LRG LVL3 (GOWN DISPOSABLE) ×8
GOWN STRL REUS W/TWL XL LVL3 (GOWN DISPOSABLE) ×4
KIT CATH CVC 3 LUMEN 7FR 8IN (MISCELLANEOUS) ×2 IMPLANT
KIT DRSG VAC SLVR GRANUFM (MISCELLANEOUS) ×2 IMPLANT
KIT TURNOVER KIT A (KITS) ×4 IMPLANT
LABEL OR SOLS (LABEL) ×2 IMPLANT
NS IRRIG 500ML POUR BTL (IV SOLUTION) ×4 IMPLANT
PACK EXTREMITY ARMC (MISCELLANEOUS) ×4 IMPLANT
PAD NEG PRESSURE SENSATRAC (MISCELLANEOUS) ×4 IMPLANT
PAD PREP 24X41 OB/GYN DISP (PERSONAL CARE ITEMS) ×4 IMPLANT
SOL PREP PVP 2OZ (MISCELLANEOUS) ×4
SOLUTION PREP PVP 2OZ (MISCELLANEOUS) ×2 IMPLANT
STOCKINETTE 48X4 2 PLY STRL (GAUZE/BANDAGES/DRESSINGS) ×2 IMPLANT
STOCKINETTE IMPERV 14X48 (MISCELLANEOUS) ×4 IMPLANT
STOCKINETTE STRL 4IN 9604848 (GAUZE/BANDAGES/DRESSINGS) ×4 IMPLANT

## 2019-09-30 NOTE — Progress Notes (Signed)
Timothy Juarez NAME: Timothy Juarez    MR#:  [id number]  DATE OF BIRTH:  12/23/1950  SUBJECTIVE:   Pt is s/p irrigation and debridement of left above knee amputation abscess with exposure to multiple stents.  Postop hypotensive secondary to acute blood loss currently on IV pressers  Went to the OR for dressing change and wound VAC placement. Today  Develop urinary retention with 500 mL bladder scan and Foley placed  REVIEW OF SYSTEMS:   Review of Systems  Constitutional: Negative for chills, fever and weight loss.  HENT: Negative for ear discharge, ear pain and nosebleeds.   Eyes: Negative for blurred vision, pain and discharge.  Respiratory: Negative for sputum production, shortness of breath, wheezing and stridor.   Cardiovascular: Negative for chest pain, palpitations, orthopnea and PND.  Gastrointestinal: Negative for abdominal pain, diarrhea, nausea and vomiting.  Genitourinary: Negative for frequency and urgency.  Musculoskeletal: Negative for back pain and joint pain.  Neurological: Negative for sensory change, speech change, focal weakness and weakness.  Psychiatric/Behavioral: Negative for depression and hallucinations. The patient is not nervous/anxious.    Tolerating Diet:yes Tolerating PT: does not walk at baseline. Does bed to WC transfers  DRUG ALLERGIES:  No Known Allergies  VITALS:  Blood pressure (!) 152/56, pulse (!) 55, temperature (!) 97.2 F (36.2 C), temperature source Oral, resp. rate 19, height 6' 0.99" (1.854 m), weight 95.3 kg, SpO2 97 %.  PHYSICAL EXAMINATION:   Physical Exam  GENERAL:  69 y.o.-year-old patient lying in the bed with no acute distress. Obese EYES: Pupils equal, round, reactive to light and accommodation. No scleral icterus.   HEENT: Head atraumatic, normocephalic. Oropharynx and nasopharynx clear.  NECK:  Supple, no jugular venous distention. No thyroid enlargement, no  tenderness. Right IJ on 09/30/2019 LUNGS: Normal breath sounds bilaterally, no wheezing, rales, rhonchi. No use of accessory muscles of respiration.  CARDIOVASCULAR: S1, S2 normal. No murmurs, rubs, or gallops.  ABDOMEN: Soft, nontender, nondistended. Bowel sounds present. No organomegaly or mass. Foley placed 2/12 EXTREMITIES: left amputation stump, oozing blood+ On feb 11th   On feb 12th     NEUROLOGIC: Cranial nerves II through XII are intact. Moves all extremities well PSYCHIATRIC:  patient is alert and oriented x 3.  SKIN: No obvious rash, lesion, or ulcer.   LABORATORY PANEL:  CBC Recent Labs  Lab 09/30/19 0418 09/30/19 0418 09/30/19 1448  WBC 16.9*  --   --   HGB 7.7*   < > 8.7*  HCT 25.2*  --   --   PLT 439*  --   --    < > = values in this interval not displayed.    Chemistries  Recent Labs  Lab 09/27/19 2143 09/28/19 0225 09/30/19 0418  NA 138   < > 138  K 3.5   < > 4.0  CL 103   < > 107  CO2 24   < > 26  GLUCOSE 111*   < > 139*  BUN 15   < > 13  CREATININE 1.26*   < > 1.16  CALCIUM 8.7*   < > 7.9*  MG  --    < > 2.1  AST 18  --   --   ALT 13  --   --   ALKPHOS 122  --   --   BILITOT 0.9  --   --    < > = values in this interval not  displayed.   Cardiac Enzymes No results for input(s): TROPONINI in the last 168 hours. RADIOLOGY:  DG Chest Port 1 View  Result Date: 09/30/2019 CLINICAL DATA:  Central line placement EXAM: PORTABLE CHEST 1 VIEW COMPARISON:  09/27/2019 FINDINGS: Right-sided central venous catheter with the tip projecting over the cavoatrial junction. Left upper lobe fibrosis. Mild bilateral chronic interstitial thickening. No focal consolidation. Stable cardiomediastinal silhouette. IMPRESSION: Right-sided central venous catheter with the tip projecting over the cavoatrial junction Electronically Signed   By: Kathreen Devoid   On: 09/30/2019 12:54   ASSESSMENT AND PLAN:  Timothy Juarez is a 69 y.o. male with medical history significant  for COPD, diastolic heart failure, hospitalized for Covid Pneumonia in May 2020, history of recurrent DVT on Eliquis, hypertension, with history of peripheral vascular disease with left SFA stent with history of left AKA, who was sent into the emergency room by vascular surgeon Dr. Lucky Cowboy due to concern for abscess at the left AKA stump, probably related to old stent and SFA  Abscess/Local bleedingof left AKA stump, with history of SFA stent left thigh -Continue vancomycin and Zosyn -ID consultation appreciated -WC rare GPR, No growth <12 hrs -Dr.Dew s/p irrigation and debridement of left above knee amputation abscess/hematoma with removal of stent on 09/29/2019 -status post dressing change with wound VAC placement 09/30/2019 -Lactic acid 1.3 -CT femur left shows heterogeneous collection of the left thigh, measuring 10.4 x 17.1 x 6.2 cm, likely a combination of hematoma and abscess. The collection extends to the medial thigh skin surface  Acute blood loss anemia secondary to post hemorrhage/abscess/hematoma drain left above knee amputation site -came in with hemoglobin of 11.2--10.4--OR--9.3--7.7--BT--8.7  Post op hypotension suspected due to volume loss with blood loss -on IV Norepinephrine   AKI (acute kidney injury) (HCC)--resolved -Suspect prerenal -IV hydration and monitor renal function -creat 1.26--1.1    PAD (peripheral artery disease) (HCC) -On aspirin and atorvastatin can be held wile n.p.o.h    History of COVID-19 -No acute concerns.  Was hospitalized for COVID-19 pneumonia with hypoxia in May 2020    (HFpEF) heart failure with preserved ejection fraction ([place]) -EF 60 to 65% in February 2019 -well compensated -holding diuretics at present   COPD with chronic bronchitis ([place]) Not acutely exacerbated Continue home bronchodilator treatments.  DuoNebs as needed    Chronic anticoagulation with eliquis for history of DVT -Patient had suction of a large amount of blood  from the amputation stump so will cover with subcu heparin instead of full bridge and resume po eliquis after surgery when ok with dr dew   urinary Retention as noted on Bladder scan per RN Foley placed  Day 1   DVT prophylaxis:  holding eliquis pending amputation stump surgery, SCD Code Status: full code  Family Communication: dter Elmo Putt Disposition Plan: Back to previous SNF environment--WOM next week Consults called: Dr. Lucky Cowboy  Barriers to dishcarge: post op-day 1  TOTAL TIME TAKING CARE OF THIS PATIENT: *30* minutes.  >50% time spent on counselling and coordination of care  Note: This dictation was prepared with Dragon dictation along with smaller phrase technology. Any transcriptional errors that result from this process are unintentional.  Fritzi Mandes M.D    [facility]   CC: Primary care physician; Alvester Morin, MDPatient ID: Timothy Juarez, male   DOB: 06/16/1951, 69 y.o.   MRN: [id number]

## 2019-09-30 NOTE — Anesthesia Postprocedure Evaluation (Signed)
Anesthesia Post Note  Patient: VALTON SCHWARTZ  Procedure(s) Performed: APPLICATION OF WOUND VAC (Left ) INSERTION CENTRAL LINE ADULT (Right Neck)  Patient location during evaluation: PACU Anesthesia Type: General Level of consciousness: awake and alert Pain management: pain level controlled Vital Signs Assessment: post-procedure vital signs reviewed and stable Respiratory status: spontaneous breathing, nonlabored ventilation, respiratory function stable and patient connected to nasal cannula oxygen Cardiovascular status: blood pressure returned to baseline and stable Postop Assessment: no apparent nausea or vomiting Anesthetic complications: no     Last Vitals:  Vitals:   09/30/19 1221 09/30/19 1245  BP:  (!) 103/49  Pulse:  67  Resp:  13  Temp: (!) 36.2 C (!) 36.2 C  SpO2:  99%    Last Pain:  Vitals:   09/30/19 1245  TempSrc: Oral  PainSc: 0-No pain                 Arita Miss

## 2019-09-30 NOTE — Anesthesia Preprocedure Evaluation (Signed)
Anesthesia Evaluation  Patient identified by MRN, date of birth, ID band Patient awake    Reviewed: Allergy & Precautions, NPO status , Patient's Chart, lab work & pertinent test results  History of Anesthesia Complications Negative for: history of anesthetic complications  Airway Mallampati: II  TM Distance: >3 FB Neck ROM: Full    Dental  (+) Teeth Intact, Poor Dentition, Dental Advisory Given   Pulmonary neg pulmonary ROS, neg sleep apnea, COPD, Patient abstained from smoking.Not current smoker, former smoker,    Pulmonary exam normal breath sounds clear to auscultation       Cardiovascular Exercise Tolerance: Good METShypertension, + Peripheral Vascular Disease and +CHF  (-) CAD and (-) Past MI (-) dysrhythmias  Rhythm:Regular Rate:Normal - Systolic murmurs    Neuro/Psych negative neurological ROS  negative psych ROS   GI/Hepatic GERD  ,(+)     (-) substance abuse  ,   Endo/Other  neg diabetes  Renal/GU negative Renal ROS     Musculoskeletal   Abdominal   Peds  Hematology  (+) anemia ,   Anesthesia Other Findings Past Medical History: No date: Acute embolism and thombos unsp deep vn unsp lower extremity  (HCC) No date: Acute respiratory failure (HCC) No date: Allergy No date: Anemia No date: Aneurysm of unspecified site [place]) No date: ARF (acute respiratory failure) (HCC)     Comment:  H/O No date: Bronchitis No date: CHF (congestive heart failure) (HCC) No date: COPD (chronic obstructive pulmonary disease) (HCC) No date: Cough No date: Epistaxis No date: GERD (gastroesophageal reflux disease) No date: Gout No date: Hyperlipidemia No date: Hypertension No date: Hypokalemia No date: Insomnia No date: Muscle contracture     Comment:  MUSCLE SPASMS, muscle weakness No date: Peripheral vascular disease (HCC) No date: Pneumonia No date: Pressure ulcer  Reproductive/Obstetrics                              Anesthesia Physical Anesthesia Plan  ASA: IV  Anesthesia Plan: General   Post-op Pain Management:    Induction: Intravenous  PONV Risk Score and Plan: 2 and Ondansetron and Dexamethasone  Airway Management Planned: LMA  Additional Equipment: None  Intra-op Plan:   Post-operative Plan: Extubation in OR  Informed Consent: I have reviewed the patients History and Physical, chart, labs and discussed the procedure including the risks, benefits and alternatives for the proposed anesthesia with the patient or authorized representative who has indicated his/her understanding and acceptance.     Dental advisory given  Plan Discussed with: CRNA and Surgeon  Anesthesia Plan Comments: (Discussed risks of anesthesia with patient, including PONV, sore throat, lip/dental damage. Rare risks discussed as well, such as cardiorespiratory sequelae. Patient understands.  Patient counseled on being high risk for procedure. Patient was told about increased risk of cardiac and respiratory events, including death. Patient understands.  )        Anesthesia Quick Evaluation

## 2019-09-30 NOTE — Transfer of Care (Signed)
Immediate Anesthesia Transfer of Care Note  Patient: Timothy Juarez  Procedure(s) Performed: IRRIGATION AND DEBRIDEMENT EXTREMITY (Left ) APPLICATION OF WOUND VAC (Left ) INSERTION CENTRAL LINE ADULT (Right Neck)  Patient Location: PACU  Anesthesia Type:General  Level of Consciousness: awake, drowsy and patient cooperative  Airway & Oxygen Therapy: Patient Spontanous Breathing and Patient connected to face mask oxygen  Post-op Assessment: Report given to RN and Post -op Vital signs reviewed and stable  Post vital signs: Reviewed and stable  Last Vitals:  Vitals Value Taken Time  BP    Temp    Pulse    Resp    SpO2      Last Pain:  Vitals:   09/30/19 0850  TempSrc: Axillary  PainSc:       Patients Stated Pain Goal: 0 (59/09/31 1216)  Complications: No apparent anesthesia complications

## 2019-09-30 NOTE — Consult Note (Signed)
Name: Timothy Juarez MRN: [id number] DOB: Sep 05, 1950    ADMISSION DATE:  09/27/2019 CONSULTATION DATE: 09/29/2019  REFERRING MD : Dr. Posey Pronto   CHIEF COMPLAINT: Bleed of Left AKA  BRIEF PATIENT DESCRIPTION:  69 yo male admitted with abscess/local bleeding of left AKA stump s/p irrigation and debridement of left above knee amputation abscess with excision of multiple stents developed postop hypotension requiring low dose neo-synephrine gtt   SIGNIFICANT EVENTS/STUDIES:  02/10: Pt admitted to medsurg unit  02/11: Pt underwent irrigation and debridement of left AKA and developed hypotension requiring low dose neo-synephrine gtt PCCM consulted   HISTORY OF PRESENT ILLNESS:  This is a 69 yo male with a history of left AKA in March 2017 who presented to [facility] ER on 02/9 with c/o pain/swelling and bleeding at the left AKA stump.  CT of left femur revealed left thigh hematoma/abscess.  ER lab results revealed creatinine 1.26, albumin 2.8, lactic acid 1.3, wbc 16.5, and hgb 12.3, K+ 3.2. Pt subsequently admitted to [facility] unit for additional workup and treatment.  Vascular surgery consulted and were concerned left AKA stump abscess probably related to old stent and SFA.  Therefore, on 02/11 pt underwent irrigation and debridement of left above knee amputation abscess with excision of multiple stents. Pt developed postop hypotension requiring low dose neo-synephrine gtt and transfer to ICU.  PCCM consulted to assist with management.   PAST MEDICAL HISTORY :   has a past medical history of Acute embolism and thombos unsp deep vn unsp lower extremity ([place]), Acute respiratory failure ([place]), Allergy, Anemia, Aneurysm of unspecified site ([place]), ARF (acute respiratory failure) ([place]), Bronchitis, CHF (congestive heart failure) ([place]), COPD (chronic obstructive pulmonary disease) (HCC), Cough, Epistaxis, GERD (gastroesophageal reflux disease), Gout, Hyperlipidemia, Hypertension, Hypokalemia, Insomnia, Muscle  contracture, Peripheral vascular disease ([place]), Pneumonia, and Pressure ulcer.  has a past surgical history that includes Cardiac catheterization (Left, 01/18/2015); Cardiac catheterization (N/A, 01/18/2015); Cardiac catheterization (07/30/2015); Cardiac catheterization (N/A, 07/30/2015); Cardiac catheterization (Left, 08/22/2015); Cardiac catheterization (Left, 08/22/2015); Amputation (Left, 09/19/2015); Wound debridement (Left, 10/18/2015); Application if wound vac (Left, 10/18/2015); Amputation (Left, 11/01/2015); Cardiac catheterization (Right, 03/31/2016); Cardiac catheterization (03/31/2016); Cardiac catheterization (Left, 04/10/2016); Colonoscopy with propofol (N/A, 05/25/2017); Esophagogastroduodenoscopy (egd) with propofol (N/A, 05/25/2017); Givens capsule study (N/A, 07/14/2017); IVC FILTER INSERTION (N/A, 10/12/2017); Esophagogastroduodenoscopy (egd) with propofol (N/A, 10/14/2017); Colonoscopy with propofol (N/A, 10/14/2017); Givens capsule study (N/A, 10/30/2017); Lower Extremity Angiography (Right, 10/04/2018); Cataract extraction w/PHACO (Right, 08/18/2019); Eye surgery; and Amputation (Left, 09/29/2019). Prior to Admission medications   Medication Sig Start Date End Date Taking? Authorizing Provider  acetaminophen (TYLENOL) 325 MG tablet Take 650 mg by mouth every 4 (four) hours as needed for fever.    Yes [provider]  acidophilus (RISAQUAD) CAPS capsule Take 1 capsule by mouth 2 (two) times daily.   Yes [provider]  ammonium lactate (AMLACTIN) 12 % cream Apply topically 2 (two) times daily. Apply to right foot   Yes [provider]  amoxicillin-clavulanate (AUGMENTIN) 500-125 MG tablet Take 1 tablet by mouth 2 (two) times daily. 09/23/19 09/29/19 Yes [provider]  aspirin EC 81 MG tablet Take 1 tablet (81 mg total) by mouth daily. 10/04/18  Yes Dew, Erskine Squibb, MD  atorvastatin (LIPITOR) 10 MG tablet Take 1 tablet (10 mg total) by mouth daily. Patient taking differently: Take  10 mg by mouth at bedtime.  10/04/18 10/04/19 Yes Dew, Erskine Squibb, MD  baclofen (LIORESAL) 10 MG tablet Take 10 mg by mouth 3 (three)  times daily. Hold for sedation   Yes [provider]  brimonidine-timolol (COMBIGAN) 0.2-0.5 % ophthalmic solution Place 1 drop into the right eye 2 (two) times daily.   Yes [provider]  ciclopirox (PENLAC) 8 % solution Apply 1 application topically daily. Apply solutions to toenails   Yes [provider]  Difluprednate (DUREZOL) 0.05 % EMUL Place 1 drop into the right eye 3 (three) times daily.    Yes [provider]  docusate sodium (COLACE) 100 MG capsule Take 100 mg by mouth daily as needed for mild constipation.   Yes [provider]  ELIQUIS 5 MG TABS tablet Take 5 mg by mouth 2 (two) times daily.  07/01/18  Yes [provider]  fentaNYL (DURAGESIC) 12 MCG/HR Place 1 patch onto the skin every 3 (three) days. 01/04/19  Yes Patrecia Pour, MD  ferrous sulfate 324 (65 Fe) MG TBEC Take 324 mg by mouth 2 (two) times daily.    Yes [provider]  furosemide (LASIX) 40 MG tablet Take 1 tablet (40 mg total) by mouth daily. 10/15/17  Yes Wieting, Richard, MD  gabapentin (NEURONTIN) 100 MG capsule Take 100 mg by mouth at bedtime.    Yes [provider]  guaiFENesin (ROBITUSSIN) 100 MG/5ML SOLN Take 15 mLs by mouth 3 (three) times daily as needed for cough (congestion).    Yes [provider]  loperamide (IMODIUM) 2 MG capsule Take 2 mg by mouth 3 (three) times daily as needed for diarrhea or loose stools.   Yes [provider]  Melatonin 5 MG TABS Take 5 mg by mouth at bedtime as needed (insomnia).    Yes [provider]  nitroGLYCERIN (NITROSTAT) 0.4 MG SL tablet Place 0.14 mg under the tongue every 5 (five) minutes as needed for chest pain.   Yes [provider]  Omega-3 Fatty Acids (FISH OIL) 1000 MG CAPS Take 1,000 mg by mouth daily.    Yes [provider]    Oxycodone HCl 10 MG TABS Take 10 mg by mouth every 8 (eight) hours as needed (moderate to severe pain).   Yes [provider]  oxymetazoline (AFRIN) 0.05 % nasal spray Place 2 sprays into both nostrils 2 (two) times daily as needed (For nose bleeds).    Yes [provider]  potassium chloride SA (K-DUR,KLOR-CON) 20 MEQ tablet Take 20 mEq by mouth daily. With or after meal 10/15/17  Yes [provider]  sodium chloride (OCEAN) 0.65 % SOLN nasal spray Place 2 sprays into both nostrils 2 (two) times daily as needed for congestion.   Yes [provider]  TRELEGY ELLIPTA 100-62.5-25 MCG/INH AEPB Inhale 1 puff into the lungs every morning.  09/24/18  Yes [provider]   No Known Allergies  FAMILY HISTORY:  family history includes Heart attack in his mother. SOCIAL HISTORY:  reports that he quit smoking about 6 years ago. His smoking use included cigarettes. He has a 44.00 pack-year smoking history. He has never used smokeless tobacco. He reports that he does not drink alcohol or use drugs.  REVIEW OF SYSTEMS: Positives in BOLD    Constitutional: Negative for fever, chills, weight loss, malaise/fatigue and diaphoresis.  HENT: Negative for hearing loss, ear pain, nosebleeds, congestion, sore throat, neck pain, tinnitus and ear discharge.   Eyes: Negative for blurred vision, double vision, photophobia, pain, discharge and redness.  Respiratory: Negative for cough, hemoptysis, sputum production, shortness of breath, wheezing and stridor.   Cardiovascular: Negative  for chest pain, palpitations, orthopnea, claudication, leg swelling and PND.  Gastrointestinal: Negative for heartburn, nausea, vomiting, abdominal pain, diarrhea, constipation, blood in stool and melena.  Genitourinary: Negative for dysuria, urgency, frequency, hematuria and flank pain.  Musculoskeletal: left aka stump pain, myalgias, back pain, joint pain and falls.  Skin: Negative for itching and  rash.  Neurological: Negative for dizziness, tingling, tremors, sensory change, speech change, focal weakness, seizures, loss of consciousness, weakness and headaches.  Endo/Heme/Allergies: Negative for environmental allergies and polydipsia. Does not bruise/bleed easily.  SUBJECTIVE:  c/o of left stump pain, however has improved since administration of pain medications   VITAL SIGNS: Temp:  [94.3 F (34.6 C)-98.3 F (36.8 C)] 97.2 F (36.2 C) (02/12 1245) Pulse Rate:  [40-118] 55 (02/12 1400) Resp:  [10-23] 19 (02/12 1400) BP: (84-165)/(28-74) 152/56 (02/12 1400) SpO2:  [91 %-100 %] 97 % (02/12 1400)  PHYSICAL EXAMINATION: General: well developed, well nourished male, resting in bed NAD Neuro: alert and oriented, follows commands HEENT: supple, no JVD  Cardiovascular: nsr, rrr, no R/G, RLE 1+ distal pulses, 1+ [name] femoral pulse  Lungs: clear throughout, even, non labored  Abdomen: +BS x4, soft, obese, non tender, non distended  Musculoskeletal: left AKA Skin: left AKA stump incision site, dressing dry and intact, right foot cool to touch, dry flaky skin   Recent Labs  Lab 09/28/19 0225 09/29/19 0435 09/30/19 0418  NA 137 139 138  K 3.2* 3.0* 4.0  CL 103 104 107  CO2 25 27 26   BUN 14 10 13   CREATININE 1.14 1.15 1.16  GLUCOSE 112* 108* 139*   Recent Labs  Lab 09/29/19 0435 09/29/19 0435 09/29/19 1445 09/29/19 1445 09/29/19 2026 09/30/19 0418 09/30/19 1448  HGB 10.4*   < > 9.7*   < > 9.3* 7.7* 8.7*  HCT 32.9*  --  32.5*  --   --  25.2*  --   WBC 13.0*  --  18.1*  --   --  16.9*  --   PLT 409*  --  478*  --   --  439*  --    < > = values in this interval not displayed.   DG Chest Port 1 View  Result Date: 09/30/2019 CLINICAL DATA:  Central line placement EXAM: PORTABLE CHEST 1 VIEW COMPARISON:  09/27/2019 FINDINGS: Right-sided central venous catheter with the tip projecting over the cavoatrial junction. Left upper lobe fibrosis. Mild bilateral chronic  interstitial thickening. No focal consolidation. Stable cardiomediastinal silhouette. IMPRESSION: Right-sided central venous catheter with the tip projecting over the cavoatrial junction Electronically Signed   By: Kathreen Devoid   On: 09/30/2019 12:54     ASSESSMENT / PLAN:  Abscess/local bleeding of left AKA stump s/p I&D with multiple stent removals-09/29/2019 - vascular surgery on case - follow recommendations  - wound vac in place - 50cc dark blood with low active draining -adequate pain control  -wound culture - rare gram variable rods -IVF rescuscitation    Circulatory shock  -s/p R IJ TLC - use vasopressor for MAP<60 - likely distributive shock due to infectious etiolgy- likely Left lower extermity with concurrent hypovolemic shock from acute blood loss   Acute blood loss anemia -Repeat h/h q12 - monitor closely for clinical signs of bleeding -s/p surgery - wound vac with appx 50cc blood -transfuse if Hb <8 -microcytic anemia with thrombocytosis indicative of slow chronic bleeding in background   Chronic diastolic CHF  - monitor Urine output -reviewed CXR as above with mild interstitial  opacification bilaterally suggestive of early pulmonary edema -judicious use of IV fluids   Chronic COPD  - DuoNEB q6h  -incentive spirometer at bedside - encourage patient to use q/1h few times   - will ask for bed chest PT with RT  -patient on vanc/zosyn or steroids at this time   Chronic [name] DVT  -Patient is currently post op and has acute blood loss anemia so eliquis is being held   GI/DVT ppx  - protonix     Critical care provider statement:    Critical care time (minutes):  33   Critical care time was exclusive of:  Separately billable procedures and  treating other patients   Critical care was necessary to treat or prevent imminent or  life-threatening deterioration of the following conditions:  s/p abcess removal , acute blood loss anemia, cirulatory shock, multiple  comorbid conditions.    Critical care was time spent personally by me on the following  activities:  Development of treatment plan with patient or surrogate,  discussions with consultants, evaluation of patient's response to  treatment, examination of patient, obtaining history from patient or  surrogate, ordering and performing treatments and interventions, ordering  and review of laboratory studies and re-evaluation of patient's condition   I assumed direction of critical care for this patient from another  provider in my specialty: no       Ottie Glazier, M.D.  [facility] [place]

## 2019-09-30 NOTE — Anesthesia Postprocedure Evaluation (Signed)
Anesthesia Post Note  Patient: Timothy Juarez  Procedure(s) Performed: IRRIGATION AND DEBRIDEMENT OF LEFT AKA (Left Knee)  Patient location during evaluation: ICU Anesthesia Type: General Level of consciousness: awake, awake and alert, oriented and patient cooperative Pain management: pain level controlled Vital Signs Assessment: post-procedure vital signs reviewed and stable Respiratory status: spontaneous breathing, nonlabored ventilation, patient connected to nasal cannula oxygen and respiratory function stable Cardiovascular status: stable and bradycardic Anesthetic complications: no     Last Vitals:  Vitals:   09/30/19 0645 09/30/19 0708  BP: 98/72 (!) 136/36  Pulse: (!) 44 (!) 44  Resp: 14 14  Temp: (!) 35 C (!) 34.6 C  SpO2: 100% 100%    Last Pain:  Vitals:   09/30/19 0708  TempSrc: Axillary  PainSc:                  Ricki Miller

## 2019-09-30 NOTE — Op Note (Signed)
[facility]  Operative Note  LINTON STOLP 13-Sep-1950 [id number] 09/30/2019   Pre-op Dx: Left Thigh AKA Abscess Post-op Dx: Same  Procedures: Procedure(s): IRRIGATION EXTREMITY (Left Extremity)  APPLICATION OF WOUND VAC (Left Thigh) INSERTION CENTRAL LINE ADULT (Right IJ)  Performed by: Hezzie Bump PA-C  Supervising Physician:  Hortencia Pilar, MD  Anesthesia:  General by LMA  Complications: None  Estimated Blood Loss:  Less than 10cc  Specimens: None  Findings: Minimal blood drainage.   Indications:  Timothy Juarez is a 69 y.o. male who presents with a very large abscess of the medial left thigh.  He has previous SFA interventions with stents in place that are no longer patent but are incorporated within the abscess cavity and are almost certainly infected. These were removed yesterday and the wound was irrigated and debridement. Patient was taken back to OR today was another irrigation with VAC placement.   Procedure Detail: The patient was identified in the holding area and taken to [place]  The patient was then placed supine on the table. general anesthesia was administered.  The patient was prepped and draped in the usual sterile fashion.  A time out was called and antibiotics were administered.  Wound was irrigated with normal saline. VAC placed. Covered with ioban and to suction without leaking.   Disposition: To PACU in stable condition.  Beckett Maden A Haja Crego, PA-C 09/30/2019 11:01 AM

## 2019-09-30 NOTE — Progress Notes (Signed)
   ID   BP (!) 103/49 (BP Location: Right Arm)   Pulse 67   Temp (!) 97.2 F (36.2 C) (Oral)   Resp 13   Ht 6' 0.99" (1.854 m)   Wt 95.3 kg   SpO2 99%   BMI 27.71 kg/m                CBC Latest Ref Rng & Units 09/30/2019 09/29/2019 09/29/2019  WBC 4.0 - 10.5 K/uL 16.9(H) - 18.1(H)  Hemoglobin 13.0 - 17.0 g/dL 7.7(L) 9.3(L) 9.7(L)  Hematocrit 39.0 - 52.0 % 25.2(L) - 32.5(L)  Platelets 150 - 400 K/uL 439(H) - 478(H)     CMP Latest Ref Rng & Units 09/30/2019 09/29/2019 09/28/2019  Glucose 70 - 99 mg/dL 139(H) 108(H) 112(H)  BUN 8 - 23 mg/dL 13 10 14   Creatinine 0.61 - 1.24 mg/dL 1.16 1.15 1.14  Sodium 135 - 145 mmol/L 138 139 137  Potassium 3.5 - 5.1 mmol/L 4.0 3.0(L) 3.2(L)  Chloride 98 - 111 mmol/L 107 104 103  CO2 22 - 32 mmol/L 26 27 25   Calcium 8.9 - 10.3 mg/dL 7.9(L) 8.2(L) 8.3(L)  Total Protein 6.5 - 8.1 g/dL - - -  Total Bilirubin 0.3 - 1.2 mg/dL - - -  Alkaline Phos 38 - 126 U/L - - -  AST 15 - 41 U/L - - -  ALT 0 - 44 U/L - - -    Microbiology-  2/9 BC-NG 2/11 Wound culture pending  Impression/recommendation  Abscess/soft tissue infection with endovascular involvement at the left AKA stump site S/o I/D and removal of stents Culture pending currently on vanco and zosyn- de-escalate depending on cultures Follow creatinine closely  Anemia- received PRBC  ID will follow him peripherally this weekend

## 2019-09-30 NOTE — Anesthesia Procedure Notes (Signed)
Procedure Name: LMA Insertion Date/Time: 09/30/2019 10:00 AM Performed by: Lowry Bowl, CRNA Pre-anesthesia Checklist: Patient identified, Emergency Drugs available, Suction available, Patient being monitored and Timeout performed Patient Re-evaluated:Patient Re-evaluated prior to induction Oxygen Delivery Method: Circle system utilized Preoxygenation: Pre-oxygenation with 100% oxygen Induction Type: IV induction Ventilation: Mask ventilation without difficulty LMA: LMA inserted LMA Size: 4.0 Number of attempts: 1 Placement Confirmation: positive ETCO2 and breath sounds checked- equal and bilateral Tube secured with: Tape Dental Injury: Teeth and Oropharynx as per pre-operative assessment

## 2019-09-30 NOTE — Progress Notes (Signed)
Changed rate of norepinephrine  to 55mcg/min per dr  Tollie Pizza order

## 2019-09-30 NOTE — Plan of Care (Addendum)
Pt transferred to OR for NPWT VAC placement and CVC insertion, he signed his own consents and verbalized understanding of the POC, 2 liters [place], O2 sats 100%.  Pt remains bradycardic, diastolic pressure soft, MDs are aware.  Levophed was infusing at 15 mcg/min when he left ICU 1, MDs aware this is higher than the ordered rate of 10 mcg/min.

## 2019-09-30 NOTE — Op Note (Signed)
[facility]   PROCEDURE NOTE  PROCEDURE: 1. Right IJ triple lumen central venous catheter placement 2. Right IJ cannulation under ultrasound guidance  PRE-OPERATIVE DIAGNOSIS: Sepsis  POST-OPERATIVE DIAGNOSIS: same as above  SURGEON: Ashish Rossetti A Chanele Douglas, PA-C  ANESTHESIA:  None  ESTIMATED BLOOD LOSS: minimal  FINDING(S): none  SPECIMEN(S):  none  INDICATIONS:   Timothy Juarez is a 69 y.o. male who presents with need for venous access.  The patient presents for central venous catheter placement.  The patient is aware the risks of central venous catheter placement include but are not limited to: bleeding, infection, central venous injury, pneumothorax, possible venous stenosis, possible malpositioning in the venous system, and possible infections related to long-term catheter presence. The patient was aware of these risks and agreed to proceed.  DESCRIPTION: After written informed consent was obtained from the patient and/or family, the patient was placed supine in the hospital bed.  The patient was prepped with chloraprep and draped in the standard fashion for a chest or neck central venous catheter placement.  I anesthesized the neck cannulation site with 1% lidocaine then under ultrasound guidance, the right IJ vein was cannulated with the 18 gauge needle and a permanent image was recorded.  A J wire was then placed down in the superior vena cava.  After a skin nick and dilatation, the triple lumen central venous catheter was placed over the wire and the wire was removed.  Each port was aspirated and flushed with sterile normal saline.  The catheter was secured in placed with three interrupted stitches of 3-0 Silk tied to the catheter.  The catheter was dressed with sterile dressing.  Stat CXR is pending  COMPLICATIONS: none apparent  CONDITION: stable  Timothy Juarez 09/30/2019, 10:56 AM  This note was created with Dragon Medical transcription  system. Any errors in dictation are purely unintentional.

## 2019-09-30 NOTE — Progress Notes (Signed)
Pharmacy Antibiotic Note  Timothy Juarez is a 70 y.o. male admitted on 09/27/2019 with abscess/soft tissue infection of left AKA stump. Patient is s/p irrigation and debridement on 2/11 with removal of stent. Patient ordered Zosyn EI 3.375g IV Q8hr started on 2/9. Pharmacy has been consulted for Vancomycin dosing.  Plan: Continue vancomycin 1g IV Q12hr. Will obtain peak/trough as clinically indicated.   Will monitor renal function daily while on vancomycin.    Height: 6' 0.99" (185.4 cm) Weight: 210 lb (95.3 kg) IBW/kg (Calculated) : 79.88  Temp (24hrs), Avg:97.1 F (36.2 C), Min:94.3 F (34.6 C), Max:98.3 F (36.8 C)  Recent Labs  Lab 09/27/19 2143 09/27/19 2346 09/28/19 0225 09/29/19 0435 09/29/19 1445 09/30/19 0418  WBC 16.5*  --  13.6* 13.0* 18.1* 16.9*  CREATININE 1.26*  --  1.14 1.15  --  1.16  LATICACIDVEN 1.3 1.2  --   --   --   --     Estimated Creatinine Clearance: 68.9 mL/min (by C-G formula based on SCr of 1.16 mg/dL).    No Known Allergies  Antimicrobials this admission: Vancomycin 2/9 >>  Zosyn  2/9 >>  Dose adjustments this admission: 2/10 Vancomycin adjusted to 1000mg  IV Q12hr.   Microbiology results: 2/11 Tissue Left Leg: no growth < 24 hours  2/11 Abscess Left Leg: no growth < 24 hours  2/9 Abscess Left Leg: few diphtheroids 2/9 BCx: no growth x 3 days  2/10 MRSA PCR: negative  2/9 COVID PCR: negative 2/9 Influenza A/B: negative   Thank you for allowing pharmacy to be a part of this patient's care.  Jaymond Waage L 09/30/2019 7:27 PM

## 2019-10-01 ENCOUNTER — Inpatient Hospital Stay: Payer: Medicare Other

## 2019-10-01 DIAGNOSIS — J449 Chronic obstructive pulmonary disease, unspecified: Secondary | ICD-10-CM | POA: Diagnosis not present

## 2019-10-01 DIAGNOSIS — T8744 Infection of amputation stump, left lower extremity: Secondary | ICD-10-CM | POA: Diagnosis not present

## 2019-10-01 DIAGNOSIS — I5030 Unspecified diastolic (congestive) heart failure: Secondary | ICD-10-CM | POA: Diagnosis not present

## 2019-10-01 DIAGNOSIS — N179 Acute kidney failure, unspecified: Secondary | ICD-10-CM | POA: Diagnosis not present

## 2019-10-01 DIAGNOSIS — L02416 Cutaneous abscess of left lower limb: Secondary | ICD-10-CM | POA: Diagnosis not present

## 2019-10-01 DIAGNOSIS — L0291 Cutaneous abscess, unspecified: Secondary | ICD-10-CM | POA: Diagnosis not present

## 2019-10-01 LAB — CBC
HCT: 24.1 % — ABNORMAL LOW (ref 39.0–52.0)
Hemoglobin: 7.7 g/dL — ABNORMAL LOW (ref 13.0–17.0)
MCH: 25.2 pg — ABNORMAL LOW (ref 26.0–34.0)
MCHC: 32 g/dL (ref 30.0–36.0)
MCV: 79 fL — ABNORMAL LOW (ref 80.0–100.0)
Platelets: 344 10*3/uL (ref 150–400)
RBC: 3.05 MIL/uL — ABNORMAL LOW (ref 4.22–5.81)
RDW: 16.8 % — ABNORMAL HIGH (ref 11.5–15.5)
WBC: 21.9 10*3/uL — ABNORMAL HIGH (ref 4.0–10.5)
nRBC: 0 % (ref 0.0–0.2)

## 2019-10-01 LAB — PHOSPHORUS: Phosphorus: 1.3 mg/dL — ABNORMAL LOW (ref 2.5–4.6)

## 2019-10-01 LAB — BASIC METABOLIC PANEL
Anion gap: 6 (ref 5–15)
BUN: 11 mg/dL (ref 8–23)
CO2: 24 mmol/L (ref 22–32)
Calcium: 7.6 mg/dL — ABNORMAL LOW (ref 8.9–10.3)
Chloride: 106 mmol/L (ref 98–111)
Creatinine, Ser: 1.09 mg/dL (ref 0.61–1.24)
GFR calc Af Amer: >60 mL/min (ref >60)
GFR calc non Af Amer: >60 mL/min (ref >60)
Glucose, Bld: 136 mg/dL — ABNORMAL HIGH (ref 70–99)
Potassium: 2.9 mmol/L — ABNORMAL LOW (ref 3.5–5.1)
Sodium: 136 mmol/L (ref 135–145)

## 2019-10-01 LAB — VANCOMYCIN, PEAK: Vancomycin Pk: 34 ug/mL (ref 30–40)

## 2019-10-01 LAB — HEMOGLOBIN: Hemoglobin: 7.8 g/dL — ABNORMAL LOW (ref 13.0–17.0)

## 2019-10-01 LAB — MAGNESIUM: Magnesium: 2.1 mg/dL (ref 1.7–2.4)

## 2019-10-01 LAB — POTASSIUM: Potassium: 3.1 mmol/L — ABNORMAL LOW (ref 3.5–5.1)

## 2019-10-01 MED ORDER — POTASSIUM CHLORIDE 10 MEQ/50ML IV SOLN
10.0000 meq | INTRAVENOUS | Status: AC
  Administered 2019-10-01 (×5): 10 meq via INTRAVENOUS
  Filled 2019-10-01 (×5): qty 50

## 2019-10-01 MED ORDER — DOPAMINE-DEXTROSE 3.2-5 MG/ML-% IV SOLN
0.0000 ug/kg/min | INTRAVENOUS | Status: DC
  Administered 2019-10-01: 5 ug/kg/min via INTRAVENOUS

## 2019-10-01 MED ORDER — POTASSIUM PHOSPHATES 15 MMOLE/5ML IV SOLN
30.0000 mmol | Freq: Once | INTRAVENOUS | Status: AC
  Administered 2019-10-01: 30 mmol via INTRAVENOUS
  Filled 2019-10-01: qty 10

## 2019-10-01 MED ORDER — LACTATED RINGERS IV BOLUS
1000.0000 mL | Freq: Once | INTRAVENOUS | Status: AC
  Administered 2019-10-01: 1000 mL via INTRAVENOUS

## 2019-10-01 NOTE — Progress Notes (Signed)
[place] [facility] Progress Note   Date of Surgery: 09/30/2019 Procedures:  Procedure(s): APPLICATION OF WOUND VAC INSERTION CENTRAL LINE ADULT Surgeon: Surgeon(s): Schnier, Dolores Lory, MD  1 Day Post-Op  History of Present Illness  Timothy Juarez is a 69 y.o. male who is 1 Day Post-Op. The patient's pre-op symptoms are Improved . Patients pain is well controlled.   Required levophed and dopamine drip to maintain MAP > 65 BP more stable this morning and levophed being weaned  Imaging: DG Chest Port 1 View  Result Date: 09/30/2019 CLINICAL DATA:  Central line placement EXAM: PORTABLE CHEST 1 VIEW COMPARISON:  09/27/2019 FINDINGS: Right-sided central venous catheter with the tip projecting over the cavoatrial junction. Left upper lobe fibrosis. Mild bilateral chronic interstitial thickening. No focal consolidation. Stable cardiomediastinal silhouette. IMPRESSION: Right-sided central venous catheter with the tip projecting over the cavoatrial junction Electronically Signed   By: Kathreen Devoid   On: 09/30/2019 12:54    Significant Diagnostic Studies: CBC Lab Results  Component Value Date   WBC 21.9 (H) 10/01/2019   HGB 7.7 (L) 10/01/2019   HCT 24.1 (L) 10/01/2019   MCV 79.0 (L) 10/01/2019   PLT 344 10/01/2019    BMET    Component Value Date/Time   NA 136 10/01/2019 0345   NA 136 12/01/2013 1317   K 2.9 (L) 10/01/2019 0345   K 3.9 12/01/2013 1317   CL 106 10/01/2019 0345   CL 100 12/01/2013 1317   CO2 24 10/01/2019 0345   CO2 33 (H) 12/01/2013 1317   GLUCOSE 136 (H) 10/01/2019 0345   GLUCOSE 91 12/01/2013 1317   BUN 11 10/01/2019 0345   BUN 24 (H) 09/25/2014 0846   CREATININE 1.09 10/01/2019 0345   CREATININE 1.29 09/25/2014 0846   CALCIUM 7.6 (L) 10/01/2019 0345   CALCIUM 9.4 12/01/2013 1317   GFRNONAA >60 10/01/2019 0345   GFRNONAA 60 (L) 09/25/2014 0846   GFRNONAA >60 03/16/2014 0750   GFRAA >60 10/01/2019 0345   GFRAA >60 09/25/2014 0846   GFRAA >60 03/16/2014 0750    COAG Lab Results  Component Value Date   INR 1.1 09/29/2019   INR 1.2 09/27/2019   INR 2.39 10/10/2017   No results found for: PTT  Physical Examination  BP Readings from Last 3 Encounters:  10/01/19 (!) 121/52  09/27/19 120/77  08/18/19 137/68   Temp Readings from Last 3 Encounters:  10/01/19 98.1 F (36.7 C) (Oral)  08/18/19 98.1 F (36.7 C) (Tympanic)  01/04/19 97.9 F (36.6 C) (Oral)   @LASTSAO2 (3)@ Pulse Readings from Last 3 Encounters:  10/01/19 63  09/27/19 92  08/18/19 79    Pt is A&O x 3  chest is clear left thigh vac in place. Mild drainage Mild tenderness over thigh  Assessment/Plan: Pt is doing better Post-op pain is controlled Start weaning pressors. Continue wound care as ordered  [name], MD  10/01/2019 9:52 AM

## 2019-10-01 NOTE — Progress Notes (Signed)
[facility] at [place] NAME: Timothy Juarez    MR#:  [id number]  DATE OF BIRTH:  22-Nov-1950  SUBJECTIVE:   Pt is s/p irrigation and debridement of left above knee amputation abscess with removal of multiple stents.  Postop hypotensive secondary to acute blood loss currently on IV pressers  Went to the OR for dressing change and wound VAC placement.  Develop urinary retention with 500 mL bladder scan and Foley placed  Eating better  REVIEW OF SYSTEMS:   Review of Systems  Constitutional: Negative for chills, fever and weight loss.  HENT: Negative for ear discharge, ear pain and nosebleeds.   Eyes: Negative for blurred vision, pain and discharge.  Respiratory: Negative for sputum production, shortness of breath, wheezing and stridor.   Cardiovascular: Negative for chest pain, palpitations, orthopnea and PND.  Gastrointestinal: Negative for abdominal pain, diarrhea, nausea and vomiting.  Genitourinary: Negative for frequency and urgency.  Musculoskeletal: Negative for back pain and joint pain.  Neurological: Negative for sensory change, speech change, focal weakness and weakness.  Psychiatric/Behavioral: Negative for depression and hallucinations. The patient is not nervous/anxious.    Tolerating Diet:yes Tolerating PT: does not walk at baseline. Does bed to WC transfers  DRUG ALLERGIES:  No Known Allergies  VITALS:  Blood pressure (!) 92/52, pulse 79, temperature 98.7 F (37.1 C), temperature source Oral, resp. rate 19, height 6' 0.99" (1.854 m), weight 95.3 kg, SpO2 98 %.  PHYSICAL EXAMINATION:   Physical Exam  GENERAL:  69 y.o.-year-old patient lying in the bed with no acute distress. Obese Critically ill EYES: Pupils equal, round, reactive to light and accommodation. No scleral icterus.   HEENT: Head atraumatic, normocephalic. Oropharynx and nasopharynx clear.  NECK:  Supple, no jugular venous distention. No thyroid  enlargement, no tenderness. Right IJ on 09/30/2019 LUNGS: Normal breath sounds bilaterally, no wheezing, rales, rhonchi. No use of accessory muscles of respiration.  CARDIOVASCULAR: S1, S2 normal. No murmurs, rubs, or gallops.  ABDOMEN: Soft, nontender, nondistended. Bowel sounds present. No organomegaly or mass. Foley placed 09/30/19 due to urinary retention EXTREMITIES: left amputation stump, oozing blood+ On feb 11th   On feb 12th     NEUROLOGIC: Cranial nerves II through XII are intact. Moves all extremities well PSYCHIATRIC:  patient is alert and oriented x 3.  SKIN: No obvious rash, lesion, or ulcer.   LABORATORY PANEL:  CBC Recent Labs  Lab 10/01/19 0345  WBC 21.9*  HGB 7.7*  HCT 24.1*  PLT 344    Chemistries  Recent Labs  Lab 09/27/19 2143 09/28/19 0225 10/01/19 0345  NA 138   < > 136  K 3.5   < > 2.9*  CL 103   < > 106  CO2 24   < > 24  GLUCOSE 111*   < > 136*  BUN 15   < > 11  CREATININE 1.26*   < > 1.09  CALCIUM 8.7*   < > 7.6*  MG  --    < > 2.1  AST 18  --   --   ALT 13  --   --   ALKPHOS 122  --   --   BILITOT 0.9  --   --    < > = values in this interval not displayed.   Cardiac Enzymes No results for input(s): TROPONINI in the last 168 hours. RADIOLOGY:  DG Chest Port 1 View  Result Date: 10/01/2019 CLINICAL DATA:  Admitted on 09/27/2019  with abscess infection of LEFT AKA stump. Pulmonary disease. EXAM: PORTABLE CHEST 1 VIEW COMPARISON:  Chest x-rays dated 09/30/2019 and 09/27/2019. FINDINGS: Chronic scarring/fibrosis at the LEFT lung apex. Lungs otherwise clear. No pleural effusion or pneumothorax is seen. Stable mild cardiomegaly. RIGHT IJ central line is stable in position with tip at the level of the RIGHT atrium. IMPRESSION: 1. No active disease. No evidence of pneumonia or pulmonary edema. 2. Stable mild cardiomegaly. Electronically Signed   By: Franki Cabot M.D.   On: 10/01/2019 10:36   DG Chest Port 1 View  Result Date:  09/30/2019 CLINICAL DATA:  Central line placement EXAM: PORTABLE CHEST 1 VIEW COMPARISON:  09/27/2019 FINDINGS: Right-sided central venous catheter with the tip projecting over the cavoatrial junction. Left upper lobe fibrosis. Mild bilateral chronic interstitial thickening. No focal consolidation. Stable cardiomediastinal silhouette. IMPRESSION: Right-sided central venous catheter with the tip projecting over the cavoatrial junction Electronically Signed   By: Kathreen Devoid   On: 09/30/2019 12:54   ASSESSMENT AND PLAN:  Timothy Juarez is a 69 y.o. male with medical history significant for COPD, diastolic heart failure, hospitalized for Covid Pneumonia in May 2020, history of recurrent DVT on Eliquis, hypertension, with history of peripheral vascular disease with left SFA stent with history of left AKA, who was sent into the emergency room by vascular surgeon Dr. Lucky Cowboy due to concern for abscess at the left AKA stump, probably related to old stent and SFA  Abscess/Hematoma  of left AKA stump, with history of SFA stent left thigh -Continue vancomycin and Zosyn -ID consultation appreciated -WC rare GPR-- few Diptheroids+  -Deep surgical wound culture 09/29/19--few WBC, rare GNR, No growth in 24 hours BC negative -Dr.Dew s/p irrigation and debridement of left above knee amputation abscess/hematoma with removal of stent on 09/29/2019 -status post dressing change with wound VAC placement 09/30/2019 -Lactic acid 1.3 -CT femur on admission left shows heterogeneous collection of the left thigh, measuring 10.4 x 17.1 x 6.2 cm, likely a combination of hematoma and abscess. The collection extends to the medial thigh skin surface  Acute blood loss anemia secondary to post hemorrhage/abscess/hematoma drain left above knee amputation site -came in with hemoglobin of 11.2--10.4--OR--9.3--7.7--BT--8.7--7.7  Post op hypotension suspected due to volume loss with blood loss -on IV Norepinephrine and Iv dopamine  (due to bradycardia)   AKI (acute kidney injury) (HCC)--resolved -Suspect prerenal -IV hydration and monitor renal function -creat 1.26--1.1    PAD (peripheral artery disease) (HCC) -On aspirin and atorvastatin can be held wile n.p.o.h    History of COVID-19 -No acute concerns.  Was hospitalized for COVID-19 pneumonia with hypoxia in May 2020    (HFpEF) heart failure with preserved ejection fraction ([place]) -EF 60 to 65% in February 2019 -well compensated -holding diuretics at present   COPD with chronic bronchitis ([place]) Not acutely exacerbated Continue home bronchodilator treatments.  DuoNebs as needed    Chronic anticoagulation with eliquis for history of DVT -Patient had suction of a large amount of blood from the amputation stump so will cover with subcu heparin instead of full bridge and resume po eliquis after surgery when ok with dr dew   urinary Retention as noted on Bladder scan per RN Foley placed  Day 2   DVT prophylaxis:  holding eliquis pending amputation stump surgery, SCD Code Status: full code  Family Communication: dter Elmo Putt Disposition Plan: Back to previous SNF environment--WOM next week Consults called: Dr. Lucky Cowboy  Barriers to dishcarge: post op-day 2, requiring pressors,  anemia  TOTAL TIME TAKING CARE OF THIS PATIENT: *30* minutes.  >50% time spent on counselling and coordination of care  Note: This dictation was prepared with Dragon dictation along with smaller phrase technology. Any transcriptional errors that result from this process are unintentional.  Fritzi Mandes M.D    [facility]   CC: Primary care physician; Alvester Morin, MDPatient ID: Timothy Juarez, male   DOB: October 05, 1950, 69 y.o.   MRN: [id number]

## 2019-10-01 NOTE — Progress Notes (Signed)
Pharmacy Electrolyte Monitoring Consult:  Pharmacy consulted to assist in monitoring and replacing electrolytes in this 69 y.o. male admitted on 09/27/2019 with with abscess/soft tissue infection of left AKA stump. Patient is s/p irrigation and debridement on 2/11 with removal of stent.  Labs:  Sodium (mmol/Juarez)  Date Value  10/01/2019 136  12/01/2013 136   Potassium (mmol/Juarez)  Date Value  10/01/2019 3.1 (Juarez)  12/01/2013 3.9   Magnesium (mg/dL)  Date Value  10/01/2019 2.1  04/01/2013 2.2   Phosphorus (mg/dL)  Date Value  10/01/2019 1.3 (Juarez)  03/29/2013 3.9   Calcium (mg/dL)  Date Value  10/01/2019 7.6 (Juarez)   Calcium, Total (mg/dL)  Date Value  12/01/2013 9.4   Albumin (g/dL)  Date Value  09/27/2019 2.8 (Juarez)  03/25/2013 1.2 (Juarez)    Assessment/Plan: Patient received potassium 52mEq IV Q1hr x 5 doses.   Will order potassium phosphate 53mmol IV X 1.   Will recheck electrolytes at 0800.   Will replace for goal potassium ~ 4, goal magnesium ~ 2, and goal phosphorus 2.5-3.   Pharmacy will continue to monitor and adjust per consult.    Timothy Juarez 10/01/2019 7:55 PM

## 2019-10-01 NOTE — Progress Notes (Signed)
Name: Timothy Juarez MRN: [id number] DOB: 03-Oct-1950    ADMISSION DATE:  09/27/2019 CONSULTATION DATE: 09/29/2019  REFERRING MD : Dr. Posey Pronto   CHIEF COMPLAINT: Bleed of Left AKA  BRIEF PATIENT DESCRIPTION:  69 yo male admitted with abscess/local bleeding of left AKA stump s/p irrigation and debridement of left above knee amputation abscess with excision of multiple stents developed postop hypotension requiring low dose neo-synephrine gtt   SIGNIFICANT EVENTS/STUDIES:  02/10: Pt admitted to medsurg unit  02/11: Pt underwent irrigation and debridement of left AKA and developed hypotension requiring low dose neo-synephrine gtt PCCM consulted   HISTORY OF PRESENT ILLNESS:  This is a 69 yo male with a history of left AKA in March 2017 who presented to [facility] ER on 02/9 with c/o pain/swelling and bleeding at the left AKA stump.  CT of left femur revealed left thigh hematoma/abscess.  ER lab results revealed creatinine 1.26, albumin 2.8, lactic acid 1.3, wbc 16.5, and hgb 12.3, K+ 3.2. Pt subsequently admitted to the [facility] unit for additional workup and treatment.  Vascular surgery consulted and were concerned left AKA stump abscess probably related to old stent and SFA.  Therefore, on 02/11 pt underwent irrigation and debridement of left above knee amputation abscess with excision of multiple stents. Pt developed postop hypotension requiring low dose neo-synephrine gtt and transfer to ICU.  PCCM consulted to assist with management.   PAST MEDICAL HISTORY :   has a past medical history of Acute embolism and thombos unsp deep vn unsp lower extremity ([place]), Acute respiratory failure ([place]), Allergy, Anemia, Aneurysm of unspecified site ([place]), ARF (acute respiratory failure) ([place]), Bronchitis, CHF (congestive heart failure) ([place]), COPD (chronic obstructive pulmonary disease) (H[facility]), Cough, Epistaxis, GERD (gastroesophageal reflux disease), Gout, Hyperlipidemia, Hypertension, Hypokalemia, Insomnia, Muscle  contracture, Peripheral vascular disease ([place]), Pneumonia, and Pressure ulcer.  has a past surgical history that includes Cardiac catheterization (Left, 01/18/2015); Cardiac catheterization (N/A, 01/18/2015); Cardiac catheterization (07/30/2015); Cardiac catheterization (N/A, 07/30/2015); Cardiac catheterization (Left, 08/22/2015); Cardiac catheterization (Left, 08/22/2015); Amputation (Left, 09/19/2015); Wound debridement (Left, 10/18/2015); Application if wound vac (Left, 10/18/2015); Amputation (Left, 11/01/2015); Cardiac catheterization (Right, 03/31/2016); Cardiac catheterization (03/31/2016); Cardiac catheterization (Left, 04/10/2016); Colonoscopy with propofol (N/A, 05/25/2017); Esophagogastroduodenoscopy (egd) with propofol (N/A, 05/25/2017); Givens capsule study (N/A, 07/14/2017); IVC FILTER INSERTION (N/A, 10/12/2017); Esophagogastroduodenoscopy (egd) with propofol (N/A, 10/14/2017); Colonoscopy with propofol (N/A, 10/14/2017); Givens capsule study (N/A, 10/30/2017); Lower Extremity Angiography (Right, 10/04/2018); Cataract extraction w/PHACO (Right, 08/18/2019); Eye surgery; Amputation (Left, 09/29/2019); Application if wound vac (Left, 09/30/2019); and Central venous catheter insertion (Right, 09/30/2019). Prior to Admission medications   Medication Sig Start Date End Date Taking? Authorizing Provider  acetaminophen (TYLENOL) 325 MG tablet Take 650 mg by mouth every 4 (four) hours as needed for fever.    Yes [provider]  acidophilus (RISAQUAD) CAPS capsule Take 1 capsule by mouth 2 (two) times daily.   Yes [provider]  ammonium lactate (AMLACTIN) 12 % cream Apply topically 2 (two) times daily. Apply to right foot   Yes [provider]  amoxicillin-clavulanate (AUGMENTIN) 500-125 MG tablet Take 1 tablet by mouth 2 (two) times daily. 09/23/19 09/29/19 Yes [provider]  aspirin EC 81 MG tablet Take 1 tablet (81 mg total) by mouth daily. 10/04/18  Yes Dew, Erskine Squibb, MD  atorvastatin  (LIPITOR) 10 MG tablet Take 1 tablet (10 mg total) by mouth daily. Patient taking differently: Take 10 mg by mouth at bedtime.  10/04/18 10/04/19 Yes Dew, Erskine Squibb, MD  baclofen (LIORESAL) 10 MG tablet Take 10 mg by mouth 3 (three) times daily. Hold for sedation   Yes [provider]  brimonidine-timolol (COMBIGAN) 0.2-0.5 % ophthalmic solution Place 1 drop into the right eye 2 (two) times daily.   Yes [provider]  ciclopirox (PENLAC) 8 % solution Apply 1 application topically daily. Apply solutions to toenails   Yes [provider]  Difluprednate (DUREZOL) 0.05 % EMUL Place 1 drop into the right eye 3 (three) times daily.    Yes [provider]  docusate sodium (COLACE) 100 MG capsule Take 100 mg by mouth daily as needed for mild constipation.   Yes [provider]  ELIQUIS 5 MG TABS tablet Take 5 mg by mouth 2 (two) times daily.  07/01/18  Yes [provider]  fentaNYL (DURAGESIC) 12 MCG/HR Place 1 patch onto the skin every 3 (three) days. 01/04/19  Yes Patrecia Pour, MD  ferrous sulfate 324 (65 Fe) MG TBEC Take 324 mg by mouth 2 (two) times daily.    Yes [provider]  furosemide (LASIX) 40 MG tablet Take 1 tablet (40 mg total) by mouth daily. 10/15/17  Yes Wieting, Richard, MD  gabapentin (NEURONTIN) 100 MG capsule Take 100 mg by mouth at bedtime.    Yes [provider]  guaiFENesin (ROBITUSSIN) 100 MG/5ML SOLN Take 15 mLs by mouth 3 (three) times daily as needed for cough (congestion).    Yes [provider]  loperamide (IMODIUM) 2 MG capsule Take 2 mg by mouth 3 (three) times daily as needed for diarrhea or loose stools.   Yes [provider]  Melatonin 5 MG TABS Take 5 mg by mouth at bedtime as needed (insomnia).    Yes [provider]  nitroGLYCERIN (NITROSTAT) 0.4 MG SL tablet Place 0.14 mg under the tongue every 5 (five) minutes as needed for chest pain.   Yes [provider]    Omega-3 Fatty Acids (FISH OIL) 1000 MG CAPS Take 1,000 mg by mouth daily.    Yes [provider]  Oxycodone HCl 10 MG TABS Take 10 mg by mouth every 8 (eight) hours as needed (moderate to severe pain).   Yes [provider]  oxymetazoline (AFRIN) 0.05 % nasal spray Place 2 sprays into both nostrils 2 (two) times daily as needed (For nose bleeds).    Yes [provider]  potassium chloride SA (K-DUR,KLOR-CON) 20 MEQ tablet Take 20 mEq by mouth daily. With or after meal 10/15/17  Yes [provider]  sodium chloride (OCEAN) 0.65 % SOLN nasal spray Place 2 sprays into both nostrils 2 (two) times daily as needed for congestion.   Yes [provider]  TRELEGY ELLIPTA 100-62.5-25 MCG/INH AEPB Inhale 1 puff into the lungs every morning.  09/24/18  Yes [provider]   No Known Allergies  FAMILY HISTORY:  family history includes Heart attack in his mother. SOCIAL HISTORY:  reports that he quit smoking about 6 years ago. His smoking use included cigarettes. He has a 44.00 pack-year smoking history. He has never used smokeless tobacco. He reports that he does not drink alcohol or use drugs.  REVIEW OF SYSTEMS: Positives in BOLD    Constitutional: Negative for fever, chills, weight loss, malaise/fatigue and diaphoresis.  HENT: Negative for hearing loss, ear pain, nosebleeds, congestion, sore throat, neck pain, tinnitus and ear discharge.   Eyes: Negative for blurred vision, double vision, photophobia, pain, discharge and redness.  Respiratory: Negative for cough, hemoptysis,  sputum production, shortness of breath, wheezing and stridor.   Cardiovascular: Negative for chest pain, palpitations, orthopnea, claudication, leg swelling and PND.  Gastrointestinal: Negative for heartburn, nausea, vomiting, abdominal pain, diarrhea, constipation, blood in stool and melena.  Genitourinary: Negative for dysuria, urgency, frequency, hematuria and flank pain.   Musculoskeletal: left aka stump pain, myalgias, back pain, joint pain and falls.  Skin: Negative for itching and rash.  Neurological: Negative for dizziness, tingling, tremors, sensory change, speech change, focal weakness, seizures, loss of consciousness, weakness and headaches.  Endo/Heme/Allergies: Negative for environmental allergies and polydipsia. Does not bruise/bleed easily.  SUBJECTIVE:  c/o of left stump pain, however has improved since administration of pain medications   VITAL SIGNS: Temp:  [97.2 F (36.2 C)-98.3 F (36.8 C)] 98.1 F (36.7 C) (02/13 0800) Pulse Rate:  [40-95] 69 (02/13 1100) Resp:  [10-21] 16 (02/13 1100) BP: (90-155)/(43-60) 94/52 (02/13 1100) SpO2:  [89 %-100 %] 97 % (02/13 1100)  PHYSICAL EXAMINATION: General: well developed, well nourished male, resting in bed NAD Neuro: alert and oriented, follows commands HEENT: supple, no JVD  Cardiovascular: nsr, rrr, no R/G, RLE 1+ distal pulses, 1+ [name] femoral pulse  Lungs: clear throughout, even, non labored  Abdomen: +BS x4, soft, obese, non tender, non distended  Musculoskeletal: left AKA Skin: left AKA stump incision site, dressing dry and intact, right foot cool to touch, dry flaky skin   Recent Labs  Lab 09/29/19 0435 09/30/19 0418 10/01/19 0345  NA 139 138 136  K 3.0* 4.0 2.9*  CL 104 107 106  CO2 27 26 24   BUN 10 13 11   CREATININE 1.15 1.16 1.09  GLUCOSE 108* 139* 136*   Recent Labs  Lab 09/29/19 1445 09/29/19 2026 09/30/19 0418 09/30/19 0418 09/30/19 1448 09/30/19 2009 10/01/19 0345  HGB 9.7*   < > 7.7*   < > 8.7* 8.5* 7.7*  HCT 32.5*  --  25.2*  --   --   --  24.1*  WBC 18.1*  --  16.9*  --   --   --  21.9*  PLT 478*  --  439*  --   --   --  344   < > = values in this interval not displayed.   DG Chest Port 1 View  Result Date: 10/01/2019 CLINICAL DATA:  Admitted on 09/27/2019 with abscess infection of LEFT AKA stump. Pulmonary disease. EXAM: PORTABLE CHEST 1 VIEW COMPARISON:   Chest x-rays dated 09/30/2019 and 09/27/2019. FINDINGS: Chronic scarring/fibrosis at the LEFT lung apex. Lungs otherwise clear. No pleural effusion or pneumothorax is seen. Stable mild cardiomegaly. RIGHT IJ central line is stable in position with tip at the level of the RIGHT atrium. IMPRESSION: 1. No active disease. No evidence of pneumonia or pulmonary edema. 2. Stable mild cardiomegaly. Electronically Signed   By: Franki Cabot M.D.   On: 10/01/2019 10:36   DG Chest Port 1 View  Result Date: 09/30/2019 CLINICAL DATA:  Central line placement EXAM: PORTABLE CHEST 1 VIEW COMPARISON:  09/27/2019 FINDINGS: Right-sided central venous catheter with the tip projecting over the cavoatrial junction. Left upper lobe fibrosis. Mild bilateral chronic interstitial thickening. No focal consolidation. Stable cardiomediastinal silhouette. IMPRESSION: Right-sided central venous catheter with the tip projecting over the cavoatrial junction Electronically Signed   By: Kathreen Devoid   On: 09/30/2019 12:54       ASSESSMENT / PLAN:  Abscess/local bleeding of left AKA stump s/p I&D with multiple stent removals-09/29/2019 - vascular surgery on case - follow  recommendations  - wound vac in place - 50cc dark blood with low active draining -adequate pain control  -wound culture - rare gram variable rods -IVF rescuscitation    Circulatory shock  -s/p R IJ TLC - use vasopressor for MAP<60 - likely distributive shock due to infectious etiolgy- likely Left lower extermity with concurrent hypovolemic shock from acute blood loss -wean off of pressors as able.  -monitor CVP - LR 250/hr x 1LR - please measure urine output  Acute blood loss anemia -Repeat h/h q12 - monitor closely for clinical signs of bleeding-mild decrement in h/h overnight 10/01/19 -s/p surgery - wound vac with appx 50cc blood -transfuse if Hb <8 -microcytic anemia with thrombocytosis indicative of slow chronic bleeding in background   Chronic  diastolic CHF  - monitor Urine output -reviewed CXR as above with mild interstitial opacification bilaterally suggestive of early pulmonary edema -judicious use of IV fluids- ordering 1L LR now -CXR this am - reassuring   Chronic COPD  - DuoNEB q6h  -incentive spirometer at bedside - encourage patient to use q/1h few times   - will ask for bed chest PT with RT  -patient on vanc/zosyn or steroids at this time   Chronic [name] DVT  -Patient is currently post op and has acute blood loss anemia so eliquis is being held   GI/DVT ppx  - protonix     Critical care provider statement:    Critical care time (minutes):  33   Critical care time was exclusive of:  Separately billable procedures and  treating other patients   Critical care was necessary to treat or prevent imminent or  life-threatening deterioration of the following conditions:  s/p abcess removal , acute blood loss anemia, cirulatory shock, multiple comorbid conditions.    Critical care was time spent personally by me on the following  activities:  Development of treatment plan with patient or surrogate,  discussions with consultants, evaluation of patient's response to  treatment, examination of patient, obtaining history from patient or  surrogate, ordering and performing treatments and interventions, ordering  and review of laboratory studies and re-evaluation of patient's condition   I assumed direction of critical care for this patient from another  provider in my specialty: no       Ottie Glazier, M.D.  [facility]

## 2019-10-01 NOTE — Progress Notes (Signed)
Pharmacy Antibiotic Note  Timothy Juarez is a 69 y.o. male admitted on 09/27/2019 with abscess/soft tissue infection of left AKA stump. Patient is s/p irrigation and debridement on 2/11 with removal of stent. Patient ordered Zosyn EI 3.375g IV Q8hr started on 2/9. Pharmacy has been consulted for Vancomycin dosing. ID consulted.   Plan: Day 5 of abx.  Continue vancomycin 1g IV Q12hr. Will obtain peak/trough after the next dose. Predicted AUC 459. Goal AUC 400-550. Scr used 1.09.   On pip/tazo 3.375 mg q8H   2/13:  Vanc peak @ 1707 = 34.   Level was timed and drawn appropriately.    Will monitor renal function daily while on vancomycin.    Height: 6' 0.99" (185.4 cm) Weight: 210 lb (95.3 kg) IBW/kg (Calculated) : 79.88  Temp (24hrs), Avg:98.2 F (36.8 C), Min:97.7 F (36.5 C), Max:98.7 F (37.1 C)  Recent Labs  Lab 09/27/19 2143 09/27/19 2143 09/27/19 2346 09/28/19 0225 09/29/19 0435 09/29/19 1445 09/30/19 0418 10/01/19 0345 10/01/19 1707  WBC 16.5*   < >  --  13.6* 13.0* 18.1* 16.9* 21.9*  --   CREATININE 1.26*  --   --  1.14 1.15  --  1.16 1.09  --   LATICACIDVEN 1.3  --  1.2  --   --   --   --   --   --   VANCOPEAK  --   --   --   --   --   --   --   --  34   < > = values in this interval not displayed.    Estimated Creatinine Clearance: 73.3 mL/min (by C-G formula based on SCr of 1.09 mg/dL).    No Known Allergies  Antimicrobials this admission: Vancomycin 2/9 >>  Zosyn  2/9 >>  Dose adjustments this admission: 2/10 Vancomycin adjusted to 1000mg  IV Q12hr.   Microbiology results: 2/11 Tissue Left Leg: no growth < 24 hours  2/11 Abscess Left Leg: no growth < 24 hours  2/9 Abscess Left Leg: few diphtheroids 2/9 BCx: no growth x 3 days  2/10 MRSA PCR: negative  2/9 COVID PCR: negative 2/9 Influenza A/B: negative   Thank you for allowing pharmacy to be a part of this patient's care.  Ilian Wessell D 10/01/2019 6:28 PM

## 2019-10-01 NOTE — Progress Notes (Addendum)
Pharmacy Antibiotic Note  Timothy Juarez is a 69 y.o. male admitted on 09/27/2019 with abscess/soft tissue infection of left AKA stump. Patient is s/p irrigation and debridement on 2/11 with removal of stent. Patient ordered Zosyn EI 3.375g IV Q8hr started on 2/9. Pharmacy has been consulted for Vancomycin dosing. ID consulted.   Plan: Day 5 of abx.  Continue vancomycin 1g IV Q12hr. Will obtain peak/trough after the next dose. Predicted AUC 459. Goal AUC 400-550. Scr used 1.09.   On pip/tazo 3.375 mg q8H   Will monitor renal function daily while on vancomycin.    Height: 6' 0.99" (185.4 cm) Weight: 210 lb (95.3 kg) IBW/kg (Calculated) : 79.88  Temp (24hrs), Avg:97.8 F (36.6 C), Min:96.8 F (36 C), Max:98.3 F (36.8 C)  Recent Labs  Lab 09/27/19 2143 09/27/19 2143 09/27/19 2346 09/28/19 0225 09/29/19 0435 09/29/19 1445 09/30/19 0418 10/01/19 0345  WBC 16.5*   < >  --  13.6* 13.0* 18.1* 16.9* 21.9*  CREATININE 1.26*  --   --  1.14 1.15  --  1.16 1.09  LATICACIDVEN 1.3  --  1.2  --   --   --   --   --    < > = values in this interval not displayed.    Estimated Creatinine Clearance: 73.3 mL/min (by C-G formula based on SCr of 1.09 mg/dL).    No Known Allergies  Antimicrobials this admission: Vancomycin 2/9 >>  Zosyn  2/9 >>  Dose adjustments this admission: 2/10 Vancomycin adjusted to 1000mg  IV Q12hr.   Microbiology results: 2/11 Tissue Left Leg: no growth < 24 hours  2/11 Abscess Left Leg: no growth < 24 hours  2/9 Abscess Left Leg: few diphtheroids 2/9 BCx: no growth x 3 days  2/10 MRSA PCR: negative  2/9 COVID PCR: negative 2/9 Influenza A/B: negative   Thank you for allowing pharmacy to be a part of this patient's care.  Oswald Hillock 10/01/2019 9:48 AM

## 2019-10-02 ENCOUNTER — Inpatient Hospital Stay
Admit: 2019-10-02 | Discharge: 2019-10-02 | Disposition: A | Discharge status: Home / Self Care | Payer: Medicare Other | Attending: Pulmonary Disease | Admitting: Pulmonary Disease

## 2019-10-02 DIAGNOSIS — J449 Chronic obstructive pulmonary disease, unspecified: Secondary | ICD-10-CM | POA: Diagnosis not present

## 2019-10-02 DIAGNOSIS — L0291 Cutaneous abscess, unspecified: Secondary | ICD-10-CM | POA: Diagnosis not present

## 2019-10-02 DIAGNOSIS — N179 Acute kidney failure, unspecified: Secondary | ICD-10-CM | POA: Diagnosis not present

## 2019-10-02 DIAGNOSIS — L02416 Cutaneous abscess of left lower limb: Secondary | ICD-10-CM | POA: Diagnosis not present

## 2019-10-02 DIAGNOSIS — I5030 Unspecified diastolic (congestive) heart failure: Secondary | ICD-10-CM | POA: Diagnosis not present

## 2019-10-02 DIAGNOSIS — T8744 Infection of amputation stump, left lower extremity: Secondary | ICD-10-CM | POA: Diagnosis not present

## 2019-10-02 LAB — CBC
HCT: 25.4 % — ABNORMAL LOW (ref 39.0–52.0)
Hemoglobin: 8 g/dL — ABNORMAL LOW (ref 13.0–17.0)
MCH: 25.2 pg — ABNORMAL LOW (ref 26.0–34.0)
MCHC: 31.5 g/dL (ref 30.0–36.0)
MCV: 80.1 fL (ref 80.0–100.0)
Platelets: 355 10*3/uL (ref 150–400)
RBC: 3.17 MIL/uL — ABNORMAL LOW (ref 4.22–5.81)
RDW: 17.7 % — ABNORMAL HIGH (ref 11.5–15.5)
WBC: 17.8 10*3/uL — ABNORMAL HIGH (ref 4.0–10.5)
nRBC: 0.1 % (ref 0.0–0.2)

## 2019-10-02 LAB — BASIC METABOLIC PANEL
Anion gap: 5 (ref 5–15)
BUN: 9 mg/dL (ref 8–23)
CO2: 26 mmol/L (ref 22–32)
Calcium: 7.4 mg/dL — ABNORMAL LOW (ref 8.9–10.3)
Chloride: 108 mmol/L (ref 98–111)
Creatinine, Ser: 0.97 mg/dL (ref 0.61–1.24)
GFR calc Af Amer: >60 mL/min (ref >60)
GFR calc non Af Amer: >60 mL/min (ref >60)
Glucose, Bld: 148 mg/dL — ABNORMAL HIGH (ref 70–99)
Potassium: 3.4 mmol/L — ABNORMAL LOW (ref 3.5–5.1)
Sodium: 139 mmol/L (ref 135–145)

## 2019-10-02 LAB — TYPE AND SCREEN
ABO/RH(D): A POS
Antibody Screen: NEGATIVE
Unit division: 0
Unit division: 0

## 2019-10-02 LAB — BPAM RBC
Blood Product Expiration Date: 202103172359
Blood Product Expiration Date: 202103172359
ISSUE DATE / TIME: 202102120648
Unit Type and Rh: 6200
Unit Type and Rh: 6200

## 2019-10-02 LAB — CULTURE, BLOOD (ROUTINE X 2)
Culture: NO GROWTH
Culture: NO GROWTH
Special Requests: ADEQUATE
Special Requests: ADEQUATE

## 2019-10-02 LAB — AEROBIC/ANAEROBIC CULTURE W GRAM STAIN (SURGICAL/DEEP WOUND)

## 2019-10-02 LAB — PHOSPHORUS: Phosphorus: 3.3 mg/dL (ref 2.5–4.6)

## 2019-10-02 LAB — MAGNESIUM: Magnesium: 1.9 mg/dL (ref 1.7–2.4)

## 2019-10-02 LAB — ECHOCARDIOGRAM COMPLETE
Height: 72.992 in
Weight: 3360 oz

## 2019-10-02 LAB — HEMOGLOBIN: Hemoglobin: 8.1 g/dL — ABNORMAL LOW (ref 13.0–17.0)

## 2019-10-02 LAB — VANCOMYCIN, TROUGH: Vancomycin Tr: 24 ug/mL (ref 15–20)

## 2019-10-02 LAB — PREPARE RBC (CROSSMATCH)

## 2019-10-02 LAB — CORTISOL-AM, BLOOD: Cortisol - AM: 12.1 ug/dL (ref 6.7–22.6)

## 2019-10-02 MED ORDER — VANCOMYCIN VARIABLE DOSE PER UNSTABLE RENAL FUNCTION (PHARMACIST DOSING): Status: DC

## 2019-10-02 MED ORDER — MIDODRINE HCL 5 MG PO TABS
5.0000 mg | ORAL_TABLET | Freq: Three times a day (TID) | ORAL | Status: DC
  Administered 2019-10-02 – 2019-10-07 (×15): 5 mg via ORAL
  Filled 2019-10-02 (×15): qty 1

## 2019-10-02 MED ORDER — HYDROCORTISONE NA SUCCINATE PF 100 MG IJ SOLR
100.0000 mg | Freq: Two times a day (BID) | INTRAMUSCULAR | Status: DC
  Administered 2019-10-02 – 2019-10-03 (×3): 100 mg via INTRAVENOUS
  Filled 2019-10-02 (×3): qty 2

## 2019-10-02 MED ORDER — POTASSIUM CHLORIDE CRYS ER 20 MEQ PO TBCR
40.0000 meq | EXTENDED_RELEASE_TABLET | Freq: Once | ORAL | Status: AC
  Administered 2019-10-02: 40 meq via ORAL
  Filled 2019-10-02: qty 2

## 2019-10-02 MED ORDER — VANCOMYCIN HCL 1500 MG/300ML IV SOLN
1500.0000 mg | INTRAVENOUS | Status: DC
  Administered 2019-10-02 – 2019-10-04 (×3): 1500 mg via INTRAVENOUS
  Filled 2019-10-02 (×4): qty 300

## 2019-10-02 MED ORDER — ALBUMIN HUMAN 25 % IV SOLN
12.5000 g | Freq: Every day | INTRAVENOUS | Status: DC
  Administered 2019-10-02 – 2019-10-03 (×2): 12.5 g via INTRAVENOUS
  Filled 2019-10-02 (×2): qty 50

## 2019-10-02 NOTE — Progress Notes (Signed)
Patient's daughter in law is a Marine scientist. She asked if she can be updated so she can "translate medical speak" for her family.  Her number Is [phone] she lives in [place].  She said the family is considering moving him to a different facility, they might need a social work consult. She will call back later.  Phillis Knack, RN

## 2019-10-02 NOTE — Progress Notes (Signed)
Name: Timothy Juarez MRN: [id number] DOB: 1950/09/27    ADMISSION DATE:  09/27/2019 CONSULTATION DATE: 09/29/2019  REFERRING MD : Dr. Posey Pronto   CHIEF COMPLAINT: Bleed of Left AKA  BRIEF PATIENT DESCRIPTION:  69 yo male admitted with abscess/local bleeding of left AKA stump s/p irrigation and debridement of left above knee amputation abscess with excision of multiple stents developed postop hypotension requiring low dose neo-synephrine gtt   SIGNIFICANT EVENTS/STUDIES:  02/10: Pt admitted to medsurg unit  02/11: Pt underwent irrigation and debridement of left AKA and developed hypotension requiring low dose neo-synephrine gtt PCCM consulted  02/13 - patient had 250/h LR for 8 hrs - urine output significantly improved , weaning pressor support 02/14- CVP treding, TTE repeat per Dr Teola Bradley for possible perioperative cardiac event,  Today will administer solucortef,albumin IV once daily and midodrine PO to help wean off IV vasopressor. Patient clinically in no distress reports feeling well today.   HISTORY OF PRESENT ILLNESS:  This is a 69 yo male with a history of left AKA in March 2017 who presented to [facility] ER on 02/9 with c/o pain/swelling and bleeding at the left AKA stump.  CT of left femur revealed left thigh hematoma/abscess.  ER lab results revealed creatinine 1.26, albumin 2.8, lactic acid 1.3, wbc 16.5, and hgb 12.3, K+ 3.2. Pt subsequently admitted to the [facility] unit for additional workup and treatment.  Vascular surgery consulted and were concerned left AKA stump abscess probably related to old stent and SFA.  Therefore, on 02/11 pt underwent irrigation and debridement of left above knee amputation abscess with excision of multiple stents. Pt developed postop hypotension requiring low dose neo-synephrine gtt and transfer to ICU.  PCCM consulted to assist with management.   PAST MEDICAL HISTORY :   has a past medical history of Acute embolism and thombos unsp deep vn unsp lower  extremity ([place]), Acute respiratory failure ([place]), Allergy, Anemia, Aneurysm of unspecified site ([place]), ARF (acute respiratory failure) ([place]), Bronchitis, CHF (congestive heart failure) ([place]), COPD (chronic obstructive pulmonary disease) (HCC), Cough, Epistaxis, GERD (gastroesophageal reflux disease), Gout, Hyperlipidemia, Hypertension, Hypokalemia, Insomnia, Muscle contracture, Peripheral vascular disease ([place]), Pneumonia, and Pressure ulcer.  has a past surgical history that includes Cardiac catheterization (Left, 01/18/2015); Cardiac catheterization (N/A, 01/18/2015); Cardiac catheterization (07/30/2015); Cardiac catheterization (N/A, 07/30/2015); Cardiac catheterization (Left, 08/22/2015); Cardiac catheterization (Left, 08/22/2015); Amputation (Left, 09/19/2015); Wound debridement (Left, 10/18/2015); Application if wound vac (Left, 10/18/2015); Amputation (Left, 11/01/2015); Cardiac catheterization (Right, 03/31/2016); Cardiac catheterization (03/31/2016); Cardiac catheterization (Left, 04/10/2016); Colonoscopy with propofol (N/A, 05/25/2017); Esophagogastroduodenoscopy (egd) with propofol (N/A, 05/25/2017); Givens capsule study (N/A, 07/14/2017); IVC FILTER INSERTION (N/A, 10/12/2017); Esophagogastroduodenoscopy (egd) with propofol (N/A, 10/14/2017); Colonoscopy with propofol (N/A, 10/14/2017); Givens capsule study (N/A, 10/30/2017); Lower Extremity Angiography (Right, 10/04/2018); Cataract extraction w/PHACO (Right, 08/18/2019); Eye surgery; Amputation (Left, 09/29/2019); Application if wound vac (Left, 09/30/2019); and Central venous catheter insertion (Right, 09/30/2019). Prior to Admission medications   Medication Sig Start Date End Date Taking? Authorizing Provider  acetaminophen (TYLENOL) 325 MG tablet Take 650 mg by mouth every 4 (four) hours as needed for fever.    Yes [provider]  acidophilus (RISAQUAD) CAPS capsule Take 1 capsule by mouth 2 (two) times daily.   Yes [provider]  ammonium lactate  (AMLACTIN) 12 % cream Apply topically 2 (two) times daily. Apply to right foot   Yes [provider]  amoxicillin-clavulanate (AUGMENTIN) 500-125 MG tablet Take 1 tablet by mouth 2 (two) times daily. 09/23/19 09/29/19 Yes [provider]  aspirin EC 81 MG tablet Take 1 tablet (81 mg total) by mouth daily. 10/04/18  Yes Dew, Erskine Squibb, MD  atorvastatin (LIPITOR) 10 MG tablet Take 1 tablet (10 mg total) by mouth daily. Patient taking differently: Take 10 mg by mouth at bedtime.  10/04/18 10/04/19 Yes Dew, Erskine Squibb, MD  baclofen (LIORESAL) 10 MG tablet Take 10 mg by mouth 3 (three) times daily. Hold for sedation   Yes [provider]  brimonidine-timolol (COMBIGAN) 0.2-0.5 % ophthalmic solution Place 1 drop into the right eye 2 (two) times daily.   Yes [provider]  ciclopirox (PENLAC) 8 % solution Apply 1 application topically daily. Apply solutions to toenails   Yes [provider]  Difluprednate (DUREZOL) 0.05 % EMUL Place 1 drop into the right eye 3 (three) times daily.    Yes [provider]  docusate sodium (COLACE) 100 MG capsule Take 100 mg by mouth daily as needed for mild constipation.   Yes [provider]  ELIQUIS 5 MG TABS tablet Take 5 mg by mouth 2 (two) times daily.  07/01/18  Yes [provider]  fentaNYL (DURAGESIC) 12 MCG/HR Place 1 patch onto the skin every 3 (three) days. 01/04/19  Yes Patrecia Pour, MD  ferrous sulfate 324 (65 Fe) MG TBEC Take 324 mg by mouth 2 (two) times daily.    Yes [provider]  furosemide (LASIX) 40 MG tablet Take 1 tablet (40 mg total) by mouth daily. 10/15/17  Yes Wieting, Richard, MD  gabapentin (NEURONTIN) 100 MG capsule Take 100 mg by mouth at bedtime.    Yes [provider]  guaiFENesin (ROBITUSSIN) 100 MG/5ML SOLN Take 15 mLs by mouth 3 (three) times daily as needed for cough (congestion).    Yes [provider]  loperamide (IMODIUM) 2 MG capsule Take 2 mg by  mouth 3 (three) times daily as needed for diarrhea or loose stools.   Yes [provider]  Melatonin 5 MG TABS Take 5 mg by mouth at bedtime as needed (insomnia).    Yes [provider]  nitroGLYCERIN (NITROSTAT) 0.4 MG SL tablet Place 0.14 mg under the tongue every 5 (five) minutes as needed for chest pain.   Yes [provider]  Omega-3 Fatty Acids (FISH OIL) 1000 MG CAPS Take 1,000 mg by mouth daily.    Yes [provider]  Oxycodone HCl 10 MG TABS Take 10 mg by mouth every 8 (eight) hours as needed (moderate to severe pain).   Yes [provider]  oxymetazoline (AFRIN) 0.05 % nasal spray Place 2 sprays into both nostrils 2 (two) times daily as needed (For nose bleeds).    Yes [provider]  potassium chloride SA (K-DUR,KLOR-CON) 20 MEQ tablet Take 20 mEq by mouth daily. With or after meal 10/15/17  Yes [provider]  sodium chloride (OCEAN) 0.65 % SOLN nasal spray Place 2 sprays into both nostrils 2 (two) times daily as needed for congestion.   Yes [provider]  TRELEGY ELLIPTA 100-62.5-25 MCG/INH AEPB Inhale 1 puff into the lungs every morning.  09/24/18  Yes [provider]   No Known Allergies  FAMILY HISTORY:  family history includes Heart attack in his mother. SOCIAL HISTORY:  reports that he quit smoking about 6 years ago. His smoking use included cigarettes. He has a 44.00 pack-year smoking history. He has never used smokeless tobacco. He reports that he does not drink alcohol or use drugs.  REVIEW OF SYSTEMS: Positives in BOLD    Constitutional: Negative for fever, chills, weight loss, malaise/fatigue and diaphoresis.  HENT: Negative for hearing loss, ear pain, nosebleeds, congestion, sore throat, neck pain, tinnitus and ear discharge.   Eyes: Negative for blurred vision, double vision, photophobia, pain, discharge and redness.  Respiratory: Negative for cough, hemoptysis, sputum production,  shortness of breath, wheezing and stridor.   Cardiovascular: Negative for chest pain, palpitations, orthopnea, claudication, leg swelling and PND.  Gastrointestinal: Negative for heartburn, nausea, vomiting, abdominal pain, diarrhea, constipation, blood in stool and melena.  Genitourinary: Negative for dysuria, urgency, frequency, hematuria and flank pain.  Musculoskeletal: left aka stump pain, myalgias, back pain, joint pain and falls.  Skin: Negative for itching and rash.  Neurological: Negative for dizziness, tingling, tremors, sensory change, speech change, focal weakness, seizures, loss of consciousness, weakness and headaches.  Endo/Heme/Allergies: Negative for environmental allergies and polydipsia. Does not bruise/bleed easily.  SUBJECTIVE:  c/o of left stump pain, however has improved since administration of pain medications   VITAL SIGNS: Temp:  [98 F (36.7 C)-98.7 F (37.1 C)] 98.2 F (36.8 C) (02/14 0900) Pulse Rate:  [46-84] 56 (02/14 0900) Resp:  [6-21] 13 (02/14 0900) BP: (92-150)/(46-83) 133/56 (02/14 0900) SpO2:  [94 %-100 %] 96 % (02/14 0900) FiO2 (%):  [24 %] 24 % (02/13 2005)  PHYSICAL EXAMINATION: General: well developed, well nourished male, resting in bed NAD Neuro: alert and oriented, follows commands HEENT: supple, no JVD  Cardiovascular: nsr, rrr, no R/G, RLE 1+ distal pulses, 1+ [name] femoral pulse  Lungs: clear throughout, even, non labored  Abdomen: +BS x4, soft, obese, non tender, non distended  Musculoskeletal: left AKA Skin: left AKA stump incision site, dressing dry and intact, right foot cool to touch, dry flaky skin   Recent Labs  Lab 09/30/19 0418 09/30/19 0418 10/01/19 0345 10/01/19 1707 10/02/19 0345  NA 138  --  136  --  139  K 4.0   < > 2.9* 3.1* 3.4*  CL 107  --  106  --  108  CO2 26  --  24  --  26  BUN 13  --  11  --  9  CREATININE 1.16  --  1.09  --  0.97  GLUCOSE 139*  --  136*  --  148*   < > = values in this interval not  displayed.   Recent Labs  Lab 09/30/19 0418 09/30/19 1448 10/01/19 0345 10/01/19 1936 10/02/19 0345  HGB 7.7*   < > 7.7* 7.8* 8.0*  HCT 25.2*  --  24.1*  --  25.4*  WBC 16.9*  --  21.9*  --  17.8*  PLT 439*  --  344  --  355   < > = values in this interval not displayed.   DG Chest Port 1 View  Result Date: 10/01/2019 CLINICAL DATA:  Admitted on 09/27/2019 with abscess infection of LEFT AKA stump. Pulmonary disease. EXAM: PORTABLE CHEST 1 VIEW COMPARISON:  Chest x-rays dated 09/30/2019 and 09/27/2019. FINDINGS: Chronic scarring/fibrosis at the LEFT lung apex. Lungs otherwise clear. No pleural effusion or pneumothorax is seen. Stable mild cardiomegaly. RIGHT IJ central line is stable in position with tip at the level of the RIGHT atrium. IMPRESSION: 1. No active disease. No evidence of pneumonia or pulmonary edema. 2. Stable mild cardiomegaly. Electronically Signed   By: Franki Cabot M.D.   On: 10/01/2019 10:36   DG Chest Port 1 View  Result Date: 09/30/2019 CLINICAL DATA:  Central line placement EXAM: PORTABLE CHEST 1 VIEW COMPARISON:  09/27/2019 FINDINGS: Right-sided central venous catheter with the tip projecting over the cavoatrial junction. Left upper lobe fibrosis. Mild bilateral chronic interstitial thickening. No focal consolidation. Stable cardiomediastinal silhouette. IMPRESSION: Right-sided central venous catheter with the tip projecting over the cavoatrial junction Electronically Signed   By: Kathreen Devoid   On: 09/30/2019 12:54       ASSESSMENT / PLAN:  Abscess/local bleeding of left AKA stump s/p I&D with multiple stent removals-09/29/2019 - vascular surgery on case - follow recommendations -Discussed case with dr Teola Bradley today  - wound vac in place - 250cc dark blood with low active draining -adequate pain control  -wound culture - rare gram variable rods-corynebacterium  -IVF rescuscitation - +4700cc   Circulatory shock  -s/p R IJ TLC- treding PVC - use vasopressor  for MAP<60-today 10/02/19 on levophed 86mcg and dopamin 28mcg - likely distributive shock due to infectious etiolgy- likely Left lower extermity with concurrent hypovolemic shock from acute blood loss -wean off of pressors as able  -monitor CVP - LR 250/hr x 1LR - please measure urine output -Solucortef 100 bid once now -midodrine via OGT 5mg  TID -Albumin 25% 1 amp daily IV -repeat TTE   Acute blood loss anemia -Repeat h/h q12 - monitor closely for clinical signs of bleeding-mild decrement in h/h overnight 10/02/19-blood in wound vac -s/p surgery - wound vac with appx 250cc blood -transfuse if Hb <8 -microcytic anemia with thrombocytosis indicative of slow chronic bleeding in background   Chronic diastolic CHF  - monitor Urine output -reviewed CXR as above with mild interstitial opacification bilaterally suggestive of early pulmonary edema -judicious use of IV fluids- s/p 2L LR yesterday -CXR this am - as above, reassuring   Chronic COPD  - DuoNEB q6h  -incentive spirometer at bedside - encourage patient to use q/1h few times   - will ask for bed chest PT with RT  -patient on vanc/zosyn   at this time   Chronic [name] DVT  -Patient is currently post op and has acute blood loss anemia so eliquis is being held   GI/DVT ppx  - protonix     Critical care provider statement:    Critical care time (minutes):  33   Critical care time was exclusive of:  Separately billable procedures and  treating other patients   Critical care was necessary to treat or prevent imminent or  life-threatening deterioration of the following conditions:  s/p abcess removal , acute blood loss anemia, cirulatory shock, multiple comorbid conditions.    Critical care was time spent personally by me on the following  activities:  Development of treatment plan with patient or surrogate,  discussions with consultants, evaluation of patient's response to  treatment, examination of patient, obtaining history  from patient or  surrogate, ordering and performing treatments and interventions, ordering  and review of laboratory studies and re-evaluation of patient's condition   I assumed direction of critical care for this patient from another  provider in my specialty: no       Ottie Glazier, M.D.  Pulmonary & [place]

## 2019-10-02 NOTE — Progress Notes (Signed)
Pharmacy Antibiotic Note  Timothy Juarez is a 69 y.o. male admitted on 09/27/2019 with abscess/soft tissue infection of left AKA stump. Patient is s/p irrigation and debridement on 2/11 with removal of stent. Patient ordered Zosyn EI 3.375g IV Q8hr started on 2/9. Pharmacy has been consulted for Vancomycin dosing. ID consulted.   Plan: 02/14 @ 0200 VT 24 mcg/mL drawn appropriately right before next dose was due.   Spoke to RN to make sure nothing odd was happening ie no vanc still running or anything, per RN no vanc was hanging for her shift.   Therefore will d/c scheduled vanc and follow up on patient's am BMP to assess renal function as I suspect patient may have gone into AKI possibly as a result of the zosyn/vanc combo, or possibly s/t hypoperfusion as patient is on an inotrope for bradycardia (has a h/o of CHF) and levophed.   Will continue to monitor and dose per levels if patient is in AKI.  Height: 6' 0.99" (185.4 cm) Weight: 210 lb (95.3 kg) IBW/kg (Calculated) : 79.88  Temp (24hrs), Avg:98.3 F (36.8 C), Min:97.9 F (36.6 C), Max:98.7 F (37.1 C)  Recent Labs  Lab 09/27/19 2143 09/27/19 2143 09/27/19 2346 09/28/19 0225 09/29/19 0435 09/29/19 1445 09/30/19 0418 10/01/19 0345 10/01/19 1707 10/02/19 0140  WBC 16.5*   < >  --  13.6* 13.0* 18.1* 16.9* 21.9*  --   --   CREATININE 1.26*  --   --  1.14 1.15  --  1.16 1.09  --   --   LATICACIDVEN 1.3  --  1.2  --   --   --   --   --   --   --   VANCOTROUGH  --   --   --   --   --   --   --   --   --  24*  VANCOPEAK  --   --   --   --   --   --   --   --  34  --    < > = values in this interval not displayed.    Estimated Creatinine Clearance: 73.3 mL/min (by C-G formula based on SCr of 1.09 mg/dL).    No Known Allergies  Antimicrobials this admission: Vancomycin 2/9 >>  Zosyn  2/9 >>  Dose adjustments this admission: 2/10 Vancomycin adjusted to 1000mg  IV Q12hr.   Microbiology results: 2/11 Tissue Left Leg: no  growth < 24 hours  2/11 Abscess Left Leg: no growth < 24 hours  2/9 Abscess Left Leg: few diphtheroids 2/9 BCx: no growth x 3 days  2/10 MRSA PCR: negative  2/9 COVID PCR: negative 2/9 Influenza A/B: negative   Thank you for allowing pharmacy to be a part of this patient's care.  Tobie Lords, PharmD, BCPS Clinical Pharmacist 10/02/2019 2:24 AM

## 2019-10-02 NOTE — Progress Notes (Signed)
[facility] [place] NAME: Timothy Juarez    MR#:  [id number]  DATE OF BIRTH:  27-Apr-1951  SUBJECTIVE:   Pt is s/p irrigation and debridement of left above knee amputation abscess with removal of multiple stents.  Postop hypotensive secondary to acute blood loss currently on IV pressers low dose dopamine and norepinephrine  Eating better  REVIEW OF SYSTEMS:   Review of Systems  Constitutional: Negative for chills, fever and weight loss.  HENT: Negative for ear discharge, ear pain and nosebleeds.   Eyes: Negative for blurred vision, pain and discharge.  Respiratory: Negative for sputum production, shortness of breath, wheezing and stridor.   Cardiovascular: Negative for chest pain, palpitations, orthopnea and PND.  Gastrointestinal: Negative for abdominal pain, diarrhea, nausea and vomiting.  Genitourinary: Negative for frequency and urgency.  Musculoskeletal: Negative for back pain and joint pain.  Neurological: Negative for sensory change, speech change, focal weakness and weakness.  Psychiatric/Behavioral: Negative for depression and hallucinations. The patient is not nervous/anxious.    Tolerating Diet:yes Tolerating PT: does not walk at baseline. Does bed to WC transfers  DRUG ALLERGIES:  No Known Allergies  VITALS:  Blood pressure (!) 120/59, pulse 95, temperature 98.4 F (36.9 C), temperature source Oral, resp. rate 15, height 6' 0.99" (1.854 m), weight 95.3 kg, SpO2 99 %.  PHYSICAL EXAMINATION:   Physical Exam  GENERAL:  69 y.o.-year-old patient lying in the bed with no acute distress. Obese Critically ill EYES: Pupils equal, round, reactive to light and accommodation. No scleral icterus.   HEENT: Head atraumatic, normocephalic. Oropharynx and nasopharynx clear.  NECK:  Supple, no jugular venous distention. No thyroid enlargement, no tenderness. Right IJ on 09/30/2019 LUNGS: Normal breath sounds bilaterally, no wheezing,  rales, rhonchi. No use of accessory muscles of respiration.  CARDIOVASCULAR: S1, S2 normal. No murmurs, rubs, or gallops.  ABDOMEN: Soft, nontender, nondistended. Bowel sounds present. No organomegaly or mass. Foley placed 09/30/19 due to urinary retention EXTREMITIES: left amputation stump, oozing blood+ On feb 11th   On feb 12th     NEUROLOGIC: Cranial nerves II through XII are intact. Moves all extremities well PSYCHIATRIC:  patient is alert and oriented x 3.  SKIN: No obvious rash, lesion, or ulcer.   LABORATORY PANEL:  CBC Recent Labs  Lab 10/02/19 0345  WBC 17.8*  HGB 8.0*  HCT 25.4*  PLT 355    Chemistries  Recent Labs  Lab 09/27/19 2143 09/28/19 0225 10/02/19 0345  NA 138   < > 139  K 3.5   < > 3.4*  CL 103   < > 108  CO2 24   < > 26  GLUCOSE 111*   < > 148*  BUN 15   < > 9  CREATININE 1.26*   < > 0.97  CALCIUM 8.7*   < > 7.4*  MG  --    < > 1.9  AST 18  --   --   ALT 13  --   --   ALKPHOS 122  --   --   BILITOT 0.9  --   --    < > = values in this interval not displayed.   Cardiac Enzymes No results for input(s): TROPONINI in the last 168 hours. RADIOLOGY:  DG Chest Port 1 View  Result Date: 10/01/2019 CLINICAL DATA:  Admitted on 09/27/2019 with abscess infection of LEFT AKA stump. Pulmonary disease. EXAM: PORTABLE CHEST 1 VIEW COMPARISON:  Chest x-rays dated  09/30/2019 and 09/27/2019. FINDINGS: Chronic scarring/fibrosis at the LEFT lung apex. Lungs otherwise clear. No pleural effusion or pneumothorax is seen. Stable mild cardiomegaly. RIGHT IJ central line is stable in position with tip at the level of the RIGHT atrium. IMPRESSION: 1. No active disease. No evidence of pneumonia or pulmonary edema. 2. Stable mild cardiomegaly. Electronically Signed   By: Franki Cabot M.D.   On: 10/01/2019 10:36   ASSESSMENT AND PLAN:  Timothy Juarez is a 69 y.o. male with medical history significant for COPD, diastolic heart failure, hospitalized for Covid Pneumonia  in May 2020, history of recurrent DVT on Eliquis, hypertension, with history of peripheral vascular disease with left SFA stent with history of left AKA, who was sent into the emergency room by vascular surgeon Dr. Lucky Cowboy due to concern for abscess at the left AKA stump, probably related to old stent and SFA  Abscess/Hematoma  of left AKA stump, with history of SFA stent left thigh -Continue vancomycin and Zosyn -ID consultation appreciated -WC rare GPR-- few Diptheroids+  -Deep surgical wound culture 09/29/19--few WBC, rare GNR, No growth so far BC negative -Dr.Dew s/p irrigation and debridement of left above knee amputation abscess/hematoma with removal of stent on 09/29/2019 -status post dressing change with wound VAC placement 09/30/2019 -Lactic acid 1.3 -CT femur on admission left shows heterogeneous collection of the left thigh, measuring 10.4 x 17.1 x 6.2 cm, likely a combination of hematoma and abscess. The collection extends to the medial thigh skin surface  Acute blood loss anemia secondary to post hemorrhage/abscess/hematoma drain left above knee amputation site -came in with hemoglobin of 11.2--10.4--OR--9.3--7.7--BT--8.7--7.7--8.0  Post op hypotension suspected due to volume loss with blood loss -on IV Norepinephrine and Iv dopamine (due to bradycardia) -Iv albumin, midodrine and solucortef to help with intravascular volume and BP   AKI (acute kidney injury) (HCC)--resolved -Suspect prerenal -IV hydration and monitor renal function -creat 1.26--1.1    PAD (peripheral artery disease) (HCC) -On aspirin and atorvastatin     History of COVID-19 -No acute concerns.  Was hospitalized for COVID-19 pneumonia with hypoxia in May 2020    (HFpEF) heart failure with preserved ejection fraction ([place]) -EF 60 to 65% in February 2019 -well compensated -holding diuretics at present   COPD with chronic bronchitis ([place]) Not acutely exacerbated Continue home bronchodilator  treatments.  DuoNebs as needed    Chronic anticoagulation with eliquis for history of DVT -Patient had suction of a large amount of blood from the amputation stump so will cover with subcu heparin instead of full bridge and resume po eliquis after surgery when ok with dr dew   urinary Retention as noted on Bladder scan per RN Foley placed  Day 3  Foley day 3 Right IJ 09/30/2019  DVT prophylaxis:  holding eliquis pending amputation stump surgery, SCD Code Status: full code  Family Communication: dter Elmo Putt Disposition Plan: Back to previous SNF environment--WOM next week Consults called: Dr. Lucky Cowboy  Barriers to discharge: post op-day  requiring pressors, anemia  TOTAL TIME TAKING CARE OF THIS PATIENT: *30* minutes.  >50% time spent on counselling and coordination of care  Note: This dictation was prepared with Dragon dictation along with smaller phrase technology. Any transcriptional errors that result from this process are unintentional.  Fritzi Mandes M.D    [facility]   CC: Primary care physician; Alvester Morin, MDPatient ID: Mikel Cella, male   DOB: [birth year], 69 y.o.   MRN: [id number]

## 2019-10-02 NOTE — Progress Notes (Signed)
*  PRELIMINARY RESULTS* Echocardiogram 2D Echocardiogram has been performed.  Timothy Juarez 10/02/2019, 1:39 PM

## 2019-10-02 NOTE — Progress Notes (Signed)
Pharmacy Antibiotic Note  Timothy Juarez is a 69 y.o. male admitted on 09/27/2019 with abscess/soft tissue infection of left AKA stump. Patient is s/p irrigation and debridement on 2/11 with removal of stent. Patient ordered Zosyn EI 3.375g IV Q8hr started on 2/9. Pharmacy has been consulted for Vancomycin dosing. ID consulted.   Plan: 02/14 @ 0400 Scr 0.97 renal function actually better, will adjust vanc regimen to:  Vancomycin 1500 mg IV Q 24 hrs. Goal AUC 400-550. Expected AUC: 518.6 SCr used: current Scr 0.97 --calculated based on levels (peak 34/trough 24) Cssmin: 13.1  Will check next vanc peak/trough 02/17 @ 1930 and 02/18 1600 respectively and continue to monitor.  Height: 6' 0.99" (185.4 cm) Weight: 210 lb (95.3 kg) IBW/kg (Calculated) : 79.88  Temp (24hrs), Avg:98.3 F (36.8 C), Min:98 F (36.7 C), Max:98.7 F (37.1 C)  Recent Labs  Lab 09/27/19 2143 09/27/19 2143 09/27/19 2346 09/28/19 0225 09/28/19 0225 09/29/19 0435 09/29/19 1445 09/30/19 0418 10/01/19 0345 10/01/19 1707 10/02/19 0140 10/02/19 0345  WBC 16.5*   < >  --  13.6*   < > 13.0* 18.1* 16.9* 21.9*  --   --  17.8*  CREATININE 1.26*   < >  --  1.14  --  1.15  --  1.16 1.09  --   --  0.97  LATICACIDVEN 1.3  --  1.2  --   --   --   --   --   --   --   --   --   VANCOTROUGH  --   --   --   --   --   --   --   --   --   --  24*  --   VANCOPEAK  --   --   --   --   --   --   --   --   --  34  --   --    < > = values in this interval not displayed.    Estimated Creatinine Clearance: 82.4 mL/min (by C-G formula based on SCr of 0.97 mg/dL).    No Known Allergies  Antimicrobials this admission: Vancomycin 2/9 >>  Zosyn  2/9 >>  Dose adjustments this admission: 2/10 Vancomycin adjusted to 1000mg  IV Q12hr.   Microbiology results: 2/11 Tissue Left Leg: no growth < 24 hours  2/11 Abscess Left Leg: no growth < 24 hours  2/9 Abscess Left Leg: few diphtheroids 2/9 BCx: no growth x 3 days  2/10 MRSA PCR:  negative  2/9 COVID PCR: negative 2/9 Influenza A/B: negative   Thank you for allowing pharmacy to be a part of this patient's care.  Tobie Lords, PharmD, BCPS Clinical Pharmacist 10/02/2019 5:03 AM

## 2019-10-02 NOTE — Progress Notes (Signed)
[facility] Progress Note   Date of Surgery: 09/30/2019 Procedures:  Procedure(s): APPLICATION OF WOUND VAC INSERTION CENTRAL LINE ADULT Surgeon: Surgeon(s): Schnier, Dolores Lory, MD  1 Day Post-Op  History of Present Illness  Timothy Juarez is a 69 y.o. male who is 2Day Post-Op. . Patients pain is well controlled.   Required fluid ressuscitation, levophed and dopamine drip to maintain MAP > 65.   BP still labile this morning  Imaging: DG Chest Port 1 View  Result Date: 10/01/2019 CLINICAL DATA:  Admitted on 09/27/2019 with abscess infection of LEFT AKA stump. Pulmonary disease. EXAM: PORTABLE CHEST 1 VIEW COMPARISON:  Chest x-rays dated 09/30/2019 and 09/27/2019. FINDINGS: Chronic scarring/fibrosis at the LEFT lung apex. Lungs otherwise clear. No pleural effusion or pneumothorax is seen. Stable mild cardiomegaly. RIGHT IJ central line is stable in position with tip at the level of the RIGHT atrium. IMPRESSION: 1. No active disease. No evidence of pneumonia or pulmonary edema. 2. Stable mild cardiomegaly. Electronically Signed   By: Franki Cabot M.D.   On: 10/01/2019 10:36   DG Chest Port 1 View  Result Date: 09/30/2019 CLINICAL DATA:  Central line placement EXAM: PORTABLE CHEST 1 VIEW COMPARISON:  09/27/2019 FINDINGS: Right-sided central venous catheter with the tip projecting over the cavoatrial junction. Left upper lobe fibrosis. Mild bilateral chronic interstitial thickening. No focal consolidation. Stable cardiomediastinal silhouette. IMPRESSION: Right-sided central venous catheter with the tip projecting over the cavoatrial junction Electronically Signed   By: Kathreen Devoid   On: 09/30/2019 12:54    Significant Diagnostic Studies: CBC Lab Results  Component Value Date   WBC 17.8 (H) 10/02/2019   HGB 8.0 (L) 10/02/2019   HCT 25.4 (L) 10/02/2019   MCV 80.1 10/02/2019   PLT 355 10/02/2019    BMET    Component Value Date/Time   NA 139 10/02/2019  0345   NA 136 12/01/2013 1317   K 3.4 (L) 10/02/2019 0345   K 3.9 12/01/2013 1317   CL 108 10/02/2019 0345   CL 100 12/01/2013 1317   CO2 26 10/02/2019 0345   CO2 33 (H) 12/01/2013 1317   GLUCOSE 148 (H) 10/02/2019 0345   GLUCOSE 91 12/01/2013 1317   BUN 9 10/02/2019 0345   BUN 24 (H) 09/25/2014 0846   CREATININE 0.97 10/02/2019 0345   CREATININE 1.29 09/25/2014 0846   CALCIUM 7.4 (L) 10/02/2019 0345   CALCIUM 9.4 12/01/2013 1317   GFRNONAA >60 10/02/2019 0345   GFRNONAA 60 (L) 09/25/2014 0846   GFRNONAA >60 03/16/2014 0750   GFRAA >60 10/02/2019 0345   GFRAA >60 09/25/2014 0846   GFRAA >60 03/16/2014 0750    COAG Lab Results  Component Value Date   INR 1.1 09/29/2019   INR 1.2 09/27/2019   INR 2.39 10/10/2017   No results found for: PTT  Physical Examination  BP Readings from Last 3 Encounters:  10/02/19 (!) 150/54  09/27/19 120/77  08/18/19 137/68   Temp Readings from Last 3 Encounters:  10/02/19 98 F (36.7 C) (Oral)  08/18/19 98.1 F (36.7 C) (Tympanic)  01/04/19 97.9 F (36.6 C) (Oral)   @LASTSAO2 (3)@ Pulse Readings from Last 3 Encounters:  10/02/19 (!) 50  09/27/19 92  08/18/19 79    Pt is A&O x 3  chest is clear left thigh vac in place. Mild drainage Mild tenderness over thigh  Assessment/Plan: Discussed with intensivist  Post-op pain is controlled Would consider echocardiogram as part of the w/u for persistent hypotension. Trial  of steroids Continue wound care as ordered  [name], MD  10/02/2019 9:04 AM

## 2019-10-02 NOTE — Progress Notes (Signed)
Patient ID: Timothy Juarez, male   DOB: 1950/10/06, 69 y.o.   MRN: [id number]  Spoke with DIL Ms Edwin Dada in [place] per request. She appreciated my call

## 2019-10-03 ENCOUNTER — Inpatient Hospital Stay: Payer: Medicare Other | Admitting: Anesthesiology

## 2019-10-03 ENCOUNTER — Encounter
Admission: EM | Disposition: A | Discharge status: Skilled Nursing Facility | Payer: Self-pay | Source: Home / Self Care | Attending: Internal Medicine

## 2019-10-03 DIAGNOSIS — L0291 Cutaneous abscess, unspecified: Secondary | ICD-10-CM | POA: Diagnosis not present

## 2019-10-03 DIAGNOSIS — T8744 Infection of amputation stump, left lower extremity: Secondary | ICD-10-CM | POA: Diagnosis not present

## 2019-10-03 DIAGNOSIS — N179 Acute kidney failure, unspecified: Secondary | ICD-10-CM | POA: Diagnosis not present

## 2019-10-03 DIAGNOSIS — L02416 Cutaneous abscess of left lower limb: Secondary | ICD-10-CM

## 2019-10-03 DIAGNOSIS — J449 Chronic obstructive pulmonary disease, unspecified: Secondary | ICD-10-CM | POA: Diagnosis not present

## 2019-10-03 DIAGNOSIS — I5030 Unspecified diastolic (congestive) heart failure: Secondary | ICD-10-CM | POA: Diagnosis not present

## 2019-10-03 HISTORY — PX: VACUUM ASSISTED CLOSURE CHANGE: SHX5227

## 2019-10-03 LAB — CBC
HCT: 27.2 % — ABNORMAL LOW (ref 39.0–52.0)
Hemoglobin: 8.4 g/dL — ABNORMAL LOW (ref 13.0–17.0)
MCH: 25.1 pg — ABNORMAL LOW (ref 26.0–34.0)
MCHC: 30.9 g/dL (ref 30.0–36.0)
MCV: 81.2 fL (ref 80.0–100.0)
Platelets: 390 10*3/uL (ref 150–400)
RBC: 3.35 MIL/uL — ABNORMAL LOW (ref 4.22–5.81)
RDW: 18.6 % — ABNORMAL HIGH (ref 11.5–15.5)
WBC: 16.8 10*3/uL — ABNORMAL HIGH (ref 4.0–10.5)
nRBC: 0 % (ref 0.0–0.2)

## 2019-10-03 LAB — BASIC METABOLIC PANEL
Anion gap: 7 (ref 5–15)
BUN: 9 mg/dL (ref 8–23)
CO2: 24 mmol/L (ref 22–32)
Calcium: 7.9 mg/dL — ABNORMAL LOW (ref 8.9–10.3)
Chloride: 108 mmol/L (ref 98–111)
Creatinine, Ser: 0.83 mg/dL (ref 0.61–1.24)
GFR calc Af Amer: >60 mL/min (ref >60)
GFR calc non Af Amer: >60 mL/min (ref >60)
Glucose, Bld: 150 mg/dL — ABNORMAL HIGH (ref 70–99)
Potassium: 3.7 mmol/L (ref 3.5–5.1)
Sodium: 139 mmol/L (ref 135–145)

## 2019-10-03 LAB — HEMOGLOBIN: Hemoglobin: 7.7 g/dL — ABNORMAL LOW (ref 13.0–17.0)

## 2019-10-03 LAB — SURGICAL PATHOLOGY

## 2019-10-03 SURGERY — REPLACEMENT, WOUND VAC DRESSING, ABDOMEN
Anesthesia: General | Laterality: Left

## 2019-10-03 MED ORDER — FENTANYL CITRATE (PF) 100 MCG/2ML IJ SOLN
INTRAMUSCULAR | Status: AC
  Filled 2019-10-03: qty 2

## 2019-10-03 MED ORDER — DOCUSATE SODIUM 100 MG PO CAPS
100.0000 mg | ORAL_CAPSULE | Freq: Two times a day (BID) | ORAL | Status: DC
  Administered 2019-10-03 – 2019-10-04 (×4): 100 mg via ORAL
  Filled 2019-10-03 (×4): qty 1

## 2019-10-03 MED ORDER — FENTANYL CITRATE (PF) 100 MCG/2ML IJ SOLN
INTRAMUSCULAR | Status: AC
  Administered 2019-10-03: 25 ug via INTRAVENOUS
  Filled 2019-10-03: qty 2

## 2019-10-03 MED ORDER — DEXAMETHASONE SODIUM PHOSPHATE 10 MG/ML IJ SOLN
INTRAMUSCULAR | Status: DC | PRN
  Administered 2019-10-03: 5 mg via INTRAVENOUS

## 2019-10-03 MED ORDER — TAMSULOSIN HCL 0.4 MG PO CAPS
0.4000 mg | ORAL_CAPSULE | Freq: Every day | ORAL | Status: DC
  Administered 2019-10-03 – 2019-10-07 (×5): 0.4 mg via ORAL
  Filled 2019-10-03 (×5): qty 1

## 2019-10-03 MED ORDER — LIDOCAINE HCL (CARDIAC) PF 100 MG/5ML IV SOSY
PREFILLED_SYRINGE | INTRAVENOUS | Status: DC | PRN
  Administered 2019-10-03: 50 mg via INTRAVENOUS

## 2019-10-03 MED ORDER — FENTANYL CITRATE (PF) 100 MCG/2ML IJ SOLN
INTRAMUSCULAR | Status: DC | PRN
  Administered 2019-10-03 (×3): 50 ug via INTRAVENOUS
  Administered 2019-10-03: 25 ug via INTRAVENOUS
  Administered 2019-10-03 (×2): 50 ug via INTRAVENOUS
  Administered 2019-10-03: 25 ug via INTRAVENOUS

## 2019-10-03 MED ORDER — MIDAZOLAM HCL 2 MG/2ML IJ SOLN
INTRAMUSCULAR | Status: DC | PRN
  Administered 2019-10-03: 1 mg via INTRAVENOUS

## 2019-10-03 MED ORDER — APIXABAN 5 MG PO TABS
5.0000 mg | ORAL_TABLET | Freq: Two times a day (BID) | ORAL | Status: DC
  Administered 2019-10-04 – 2019-10-07 (×7): 5 mg via ORAL
  Filled 2019-10-03 (×7): qty 1

## 2019-10-03 MED ORDER — SUCCINYLCHOLINE CHLORIDE 20 MG/ML IJ SOLN
INTRAMUSCULAR | Status: DC | PRN
  Administered 2019-10-03: 100 mg via INTRAVENOUS

## 2019-10-03 MED ORDER — LIDOCAINE HCL (PF) 2 % IJ SOLN
INTRAMUSCULAR | Status: AC
  Filled 2019-10-03: qty 10

## 2019-10-03 MED ORDER — OXYCODONE HCL 5 MG PO TABS: 5.0000 mg | ORAL_TABLET | Freq: Once | ORAL | Status: DC | PRN

## 2019-10-03 MED ORDER — SODIUM CHLORIDE 0.9 % IV SOLN: INTRAVENOUS | Status: DC | PRN

## 2019-10-03 MED ORDER — DEXAMETHASONE SODIUM PHOSPHATE 10 MG/ML IJ SOLN
INTRAMUSCULAR | Status: AC
  Filled 2019-10-03: qty 1

## 2019-10-03 MED ORDER — ONDANSETRON HCL 4 MG/2ML IJ SOLN
INTRAMUSCULAR | Status: DC | PRN
  Administered 2019-10-03: 4 mg via INTRAVENOUS

## 2019-10-03 MED ORDER — GLYCOPYRROLATE 0.2 MG/ML IJ SOLN
INTRAMUSCULAR | Status: AC
  Filled 2019-10-03: qty 1

## 2019-10-03 MED ORDER — PHENYLEPHRINE HCL (PRESSORS) 10 MG/ML IV SOLN
INTRAVENOUS | Status: DC | PRN
  Administered 2019-10-03: 50 ug via INTRAVENOUS

## 2019-10-03 MED ORDER — HYDROCORTISONE NA SUCCINATE PF 100 MG IJ SOLR
50.0000 mg | Freq: Two times a day (BID) | INTRAMUSCULAR | Status: DC
  Administered 2019-10-03 – 2019-10-04 (×2): 50 mg via INTRAVENOUS
  Filled 2019-10-03 (×2): qty 2

## 2019-10-03 MED ORDER — OXYCODONE HCL 5 MG/5ML PO SOLN: 5.0000 mg | Freq: Once | ORAL | Status: DC | PRN

## 2019-10-03 MED ORDER — MIDAZOLAM HCL 2 MG/2ML IJ SOLN
INTRAMUSCULAR | Status: AC
  Filled 2019-10-03: qty 2

## 2019-10-03 MED ORDER — FENTANYL CITRATE (PF) 100 MCG/2ML IJ SOLN
25.0000 ug | INTRAMUSCULAR | Status: DC | PRN
  Administered 2019-10-03 (×3): 25 ug via INTRAVENOUS

## 2019-10-03 MED ORDER — PANTOPRAZOLE SODIUM 40 MG PO TBEC
40.0000 mg | DELAYED_RELEASE_TABLET | Freq: Every day | ORAL | Status: DC
  Administered 2019-10-03 – 2019-10-07 (×5): 40 mg via ORAL
  Filled 2019-10-03 (×4): qty 1

## 2019-10-03 MED ORDER — PROPOFOL 10 MG/ML IV BOLUS
INTRAVENOUS | Status: DC | PRN
  Administered 2019-10-03: 100 mg via INTRAVENOUS

## 2019-10-03 MED ORDER — DOPAMINE-DEXTROSE 3.2-5 MG/ML-% IV SOLN
0.0000 ug/kg/min | INTRAVENOUS | Status: DC
  Administered 2019-10-03: 2.5 ug/kg/min via INTRAVENOUS

## 2019-10-03 MED ORDER — ESMOLOL HCL 100 MG/10ML IV SOLN
INTRAVENOUS | Status: AC
  Filled 2019-10-03: qty 10

## 2019-10-03 MED ORDER — DEXMEDETOMIDINE HCL IN NACL 200 MCG/50ML IV SOLN
INTRAVENOUS | Status: DC | PRN
  Administered 2019-10-03: 12 ug via INTRAVENOUS

## 2019-10-03 MED ORDER — ONDANSETRON HCL 4 MG/2ML IJ SOLN
INTRAMUSCULAR | Status: AC
  Filled 2019-10-03: qty 2

## 2019-10-03 MED ORDER — PROPOFOL 10 MG/ML IV BOLUS
INTRAVENOUS | Status: AC
  Filled 2019-10-03: qty 20

## 2019-10-03 SURGICAL SUPPLY — 25 items
CANISTER SUCT 1200ML W/VALVE (MISCELLANEOUS) ×3 IMPLANT
CANISTER WOUND CARE 500ML ATS (WOUND CARE) ×3 IMPLANT
COVER WAND RF STERILE (DRAPES) ×3 IMPLANT
DRAPE INCISE IOBAN 66X45 STRL (DRAPES) ×3 IMPLANT
DRAPE LAPAROTOMY 100X77 ABD (DRAPES) ×3 IMPLANT
DRSG TEGADERM 6X8 (GAUZE/BANDAGES/DRESSINGS) ×2 IMPLANT
DRSG TEGADERM 8X12 (GAUZE/BANDAGES/DRESSINGS) ×2 IMPLANT
DRSG VAC ATS LRG SENSATRAC (GAUZE/BANDAGES/DRESSINGS) ×1 IMPLANT
DRSG VAC ATS MED SENSATRAC (GAUZE/BANDAGES/DRESSINGS) ×1 IMPLANT
ELECT REM PT RETURN 9FT ADLT (ELECTROSURGICAL) ×3
ELECTRODE REM PT RTRN 9FT ADLT (ELECTROSURGICAL) ×1 IMPLANT
GLOVE BIO SURGEON STRL SZ7 (GLOVE) ×7 IMPLANT
GLOVE BIOGEL PI IND STRL 6.5 (GLOVE) IMPLANT
GLOVE BIOGEL PI INDICATOR 6.5 (GLOVE) ×4
GOWN STRL REUS W/ TWL LRG LVL3 (GOWN DISPOSABLE) ×1 IMPLANT
GOWN STRL REUS W/ TWL XL LVL3 (GOWN DISPOSABLE) ×1 IMPLANT
GOWN STRL REUS W/TWL LRG LVL3 (GOWN DISPOSABLE) ×6
GOWN STRL REUS W/TWL XL LVL3 (GOWN DISPOSABLE) ×3
KIT DRSG VAC SLVR GRANUFM (MISCELLANEOUS) ×2 IMPLANT
KIT TURNOVER KIT A (KITS) ×3 IMPLANT
NS IRRIG 1000ML POUR BTL (IV SOLUTION) ×3 IMPLANT
PACK BASIN MINOR ARMC (MISCELLANEOUS) ×3 IMPLANT
SOL PREP PVP 2OZ (MISCELLANEOUS) ×3
SOLUTION PREP PVP 2OZ (MISCELLANEOUS) ×1 IMPLANT
SPONGE LAP 18X18 RF (DISPOSABLE) ×3 IMPLANT

## 2019-10-03 NOTE — Progress Notes (Signed)
[facility] NAME: Timothy Juarez    MR#:  [id number]  DATE OF BIRTH:  October 10, 1950  SUBJECTIVE:   Pt is s/p irrigation and debridement of left above knee amputation abscess with removal of multiple stents.  Wound vac change in OR today (2nd time) HR in the 40-50's --back on low dose dopamine gtt -off Norepinephrine  REVIEW OF SYSTEMS:   Review of Systems  Constitutional: Negative for chills, fever and weight loss.  HENT: Negative for ear discharge, ear pain and nosebleeds.   Eyes: Negative for blurred vision, pain and discharge.  Respiratory: Negative for sputum production, shortness of breath, wheezing and stridor.   Cardiovascular: Negative for chest pain, palpitations, orthopnea and PND.  Gastrointestinal: Negative for abdominal pain, diarrhea, nausea and vomiting.  Genitourinary: Negative for frequency and urgency.  Musculoskeletal: Negative for back pain and joint pain.  Neurological: Negative for sensory change, speech change, focal weakness and weakness.  Psychiatric/Behavioral: Negative for depression and hallucinations. The patient is not nervous/anxious.    Tolerating Diet:yes Tolerating PT: does not walk at baseline. Does bed to WC transfers  DRUG ALLERGIES:  No Known Allergies  VITALS:  Blood pressure (!) 149/60, pulse (!) 57, temperature (!) 97.2 F (36.2 C), resp. rate 12, height 6' 0.99" (1.854 m), weight 95.3 kg, SpO2 100 %.  PHYSICAL EXAMINATION:   Physical Exam  GENERAL:  69 y.o.-year-old patient lying in the bed with no acute distress. Obese Critically ill EYES: Pupils equal, round, reactive to light and accommodation. No scleral icterus.   HEENT: Head atraumatic, normocephalic. Oropharynx and nasopharynx clear.  NECK:  Supple, no jugular venous distention. No thyroid enlargement, no tenderness. Right IJ on 09/30/2019 LUNGS: Normal breath sounds bilaterally, no wheezing, rales, rhonchi. No use of  accessory muscles of respiration.  CARDIOVASCULAR: S1, S2 normal. No murmurs, rubs, or gallops.  ABDOMEN: Soft, nontender, nondistended. Bowel sounds present. No organomegaly or mass. Foley placed 09/30/19 due to urinary retention EXTREMITIES: left amputation stump, oozing blood+ On feb 11th   On feb 12th     NEUROLOGIC: Cranial nerves II through XII are intact. Moves all extremities well PSYCHIATRIC:  patient is alert and oriented x 3.  SKIN: as above  LABORATORY PANEL:  CBC Recent Labs  Lab 10/03/19 0510  WBC 16.8*  HGB 8.4*  HCT 27.2*  PLT 390    Chemistries  Recent Labs  Lab 09/27/19 2143 09/28/19 0225 10/02/19 0345 10/02/19 0345 10/03/19 0510  NA 138   < > 139   < > 139  K 3.5   < > 3.4*   < > 3.7  CL 103   < > 108   < > 108  CO2 24   < > 26   < > 24  GLUCOSE 111*   < > 148*   < > 150*  BUN 15   < > 9   < > 9  CREATININE 1.26*   < > 0.97   < > 0.83  CALCIUM 8.7*   < > 7.4*   < > 7.9*  MG  --    < > 1.9  --   --   AST 18  --   --   --   --   ALT 13  --   --   --   --   ALKPHOS 122  --   --   --   --   BILITOT 0.9  --   --   --   --    < > =  values in this interval not displayed.   Cardiac Enzymes No results for input(s): TROPONINI in the last 168 hours. RADIOLOGY:  ECHOCARDIOGRAM COMPLETE  Result Date: 10/02/2019    ECHOCARDIOGRAM REPORT   Patient Name:   Timothy Juarez Date of Exam: 10/02/2019 Medical Rec #:  [id number]         Height:       73.0 in Accession #:    [id number]        Weight:       210.0 lb Date of Birth:  Nov 07, 1950         BSA:          2.20 m Patient Age:    76 years          BP:           150/54 mmHg Patient Gender: M                 HR:           50 bpm. Exam Location:  [facility] Procedure: 2D Echo, Cardiac Doppler and Color Doppler Indications:     CHF 428.31  History:         Patient has prior history of Echocardiogram examinations. CHF;                  Risk Factors:Hypertension.  Sonographer:     Alyse Low Roar Referring Phys:  [id number] Ottie Glazier Diagnosing Phys: Serafina Royals MD  Sonographer Comments: Technically difficult study due to poor echo windows. IMPRESSIONS  1. Left ventricular ejection fraction, by estimation, is 60 to 65%. The left ventricle has normal function. The left ventricle has no regional wall motion abnormalities. Left ventricular diastolic parameters were normal.  2. Right ventricular systolic function is normal. The right ventricular size is normal. There is normal pulmonary artery systolic pressure.  3. The mitral valve is normal in structure and function. Trivial mitral valve regurgitation.  4. The aortic valve is normal in structure and function. Aortic valve regurgitation is not visualized. FINDINGS  Left Ventricle: Left ventricular ejection fraction, by estimation, is 60 to 65%. The left ventricle has normal function. The left ventricle has no regional wall motion abnormalities. There is no left ventricular hypertrophy. Left ventricular diastolic parameters were normal. Right Ventricle: The right ventricular size is normal. No increase in right ventricular wall thickness. Right ventricular systolic function is normal. There is normal pulmonary artery systolic pressure. The tricuspid regurgitant velocity is 1.82 m/s, and  with an assumed right atrial pressure of 10 mmHg, the estimated right ventricular systolic pressure is 69.4 mmHg. Left Atrium: Left atrial size was normal in size. Right Atrium: Right atrial size was normal in size. Pericardium: There is no evidence of pericardial effusion. Mitral Valve: The mitral valve is normal in structure and function. Trivial mitral valve regurgitation. Tricuspid Valve: The tricuspid valve is normal in structure. Tricuspid valve regurgitation is trivial. Aortic Valve: The aortic valve is normal in structure and function. Aortic valve regurgitation is not visualized. Aortic valve mean gradient measures 2.0 mmHg. Aortic valve peak gradient measures 4.5 mmHg. Aortic valve area, by  VTI measures 2.97 cm. Pulmonic Valve: The pulmonic valve was normal in structure. Pulmonic valve regurgitation is not visualized. Aorta: The aortic root and ascending aorta are structurally normal, with no evidence of dilitation. IAS/Shunts: No atrial level shunt detected by color flow Doppler.  LEFT VENTRICLE PLAX 2D LVIDd:         3.14 cm  Diastology LVIDs:  2.17 cm  LV e' lateral:   8.70 cm/s LV PW:         0.86 cm  LV E/e' lateral: 5.1 LV IVS:        1.09 cm  LV e' medial:    6.96 cm/s LVOT diam:     1.80 cm  LV E/e' medial:  6.4 LV SV:         61.58 ml LV SV Index:   10.52 LVOT Area:     2.54 cm  RIGHT VENTRICLE RV Mid diam:    2.94 cm RV S prime:     13.60 cm/s LEFT ATRIUM           Index       RIGHT ATRIUM           Index LA Vol (A4C): 30.7 ml 13.98 ml/m RA Area:     10.70 cm                                   RA Volume:   24.20 ml  11.02 ml/m  AORTIC VALVE                   PULMONIC VALVE AV Area (Vmax):    2.83 cm    PV Vmax:        0.94 m/s AV Area (Vmean):   2.90 cm    PV Peak grad:   3.5 mmHg AV Area (VTI):     2.97 cm    RVOT Peak grad: 2 mmHg AV Vmax:           106.00 cm/s AV Vmean:          62.900 cm/s AV VTI:            0.207 m AV Peak Grad:      4.5 mmHg AV Mean Grad:      2.0 mmHg LVOT Vmax:         118.00 cm/s LVOT Vmean:        71.700 cm/s LVOT VTI:          0.242 m LVOT/AV VTI ratio: 1.17  AORTA Ao Root diam: 3.00 cm MITRAL VALVE               TRICUSPID VALVE MV Area (PHT): 2.48 cm    TR Peak grad:   13.2 mmHg MV Decel Time: 306 msec    TR Vmax:        182.00 cm/s MV E velocity: 44.60 cm/s MV A velocity: 76.60 cm/s  SHUNTS MV E/A ratio:  0.58        Systemic VTI:  0.24 m                            Systemic Diam: 1.80 cm Serafina Royals MD Electronically signed by Serafina Royals MD Signature Date/Time: 10/02/2019/4:19:14 PM    Final    ASSESSMENT AND PLAN:  ALLANTE WHITMIRE is a 69 y.o. male with medical history significant for COPD, diastolic heart failure, hospitalized for  Covid Pneumonia in May 2020, history of recurrent DVT on Eliquis, hypertension, with history of peripheral vascular disease with left SFA stent with history of left AKA, who was sent into the emergency room by vascular surgeon Dr. Lucky Cowboy due to concern for abscess at the left AKA stump, probably related to old stent and SFA  Abscess/Hematoma  of left AKA stump, with history of SFA stent left thigh -Continue vancomycin and Zosyn -ID consultation appreciated -WC rare GPR-- few Diptheroids+  -Deep surgical wound culture 09/29/19--few WBC, rare GNR, No growth so far BC negative -Dr.Dew s/p irrigation and debridement of left above knee amputation abscess/hematoma with removal of stent on 09/29/2019 -status post dressing change with wound VAC placement 09/30/2019 -Lactic acid 1.3 -CT femur on admission left shows heterogeneous collection of the left thigh, measuring 10.4 x 17.1 x 6.2 cm, likely a combination of hematoma and abscess. The collection extends to the medial thigh skin surface -s/p wound vac change in OR today  Acute blood loss anemia secondary to post hemorrhage/abscess/hematoma drain left above knee amputation site -came in with hemoglobin of 11.2--10.4--OR--9.3--7.7--BT--8.7--7.7--8.0--8.4  Post op hypotension suspected due to volume loss with blood loss -off IVNorepinephrine  -on low dose Iv dopamine (due to bradycardia) -received Iv albumin, midodrine and solucortef to help with intravascular volume and BP   AKI (acute kidney injury) (HCC)--resolved -Suspect prerenal -IV hydration and monitor renal function -creat 1.26--1.1 -good uop    PAD (peripheral artery disease) (HCC) -On aspirin and atorvastatin     (HFpEF) heart failure with preserved ejection fraction (HCC) -EF 60 to 65% in February 2019 -well compensated -holding diuretics at present   COPD with chronic bronchitis ([place]) Not acutely exacerbated Continue home bronchodilator treatments.  DuoNebs as needed     Chronic anticoagulation with eliquis for history of DVT -resume po eliquis from 10/04/2019 per Dr dew   urinary Retention as noted on Bladder scan per RN -start po flomax Foley placed  Day 4  Foley day 4 Right IJ  Day 4 (09/30/2019)  DVT prophylaxis:  holding eliquis pending amputation stump surgery, SCD Code Status: full code  Family Communication: dter Elmo Putt and DIL Disposition Plan: Back to previous SNF environment--WOM in 2-4 days or earlier Consults called: Dr. Lucky Cowboy  Barriers to discharge: post op-day  requiring pressors, anemia  TOTAL TIME TAKING CARE OF THIS PATIENT: *30* minutes.  >50% time spent on counselling and coordination of care  Note: This dictation was prepared with Dragon dictation along with smaller phrase technology. Any transcriptional errors that result from this process are unintentional.  Fritzi Mandes M.D    [facility]   CC: Primary care physician; Alvester Morin, MDPatient ID: Mikel Cella, male   DOB: [birth year], 69 y.o.   MRN: [id number]

## 2019-10-03 NOTE — Anesthesia Procedure Notes (Signed)
Procedure Name: Intubation Performed by: Welborn Keena, CRNA Pre-anesthesia Checklist: Patient identified, Patient being monitored, Timeout performed, Emergency Drugs available and Suction available Patient Re-evaluated:Patient Re-evaluated prior to induction Oxygen Delivery Method: Circle system utilized Preoxygenation: Pre-oxygenation with 100% oxygen Induction Type: IV induction and Rapid sequence Laryngoscope Size: McGraph and 4 Grade View: Grade I Tube type: Oral Tube size: 7.5 mm Number of attempts: 1 Airway Equipment and Method: Stylet and Video-laryngoscopy Placement Confirmation: ETT inserted through vocal cords under direct vision,  positive ETCO2 and breath sounds checked- equal and bilateral Secured at: 22 cm Tube secured with: Tape Dental Injury: Teeth and Oropharynx as per pre-operative assessment        

## 2019-10-03 NOTE — Transfer of Care (Signed)
Immediate Anesthesia Transfer of Care Note  Patient: Timothy Juarez  Procedure(s) Performed: LEFT THIGH VACUUM ASSISTED CLOSURE CHANGE (Left )  Patient Location: PACU  Anesthesia Type:General  Level of Consciousness: awake and alert   Airway & Oxygen Therapy: Patient Spontanous Breathing and Patient connected to face mask oxygen  Post-op Assessment: Report given to RN and Post -op Vital signs reviewed and stable  Post vital signs: Reviewed  Last Vitals:  Vitals Value Taken Time  BP 113/73 10/03/19 1316  Temp    Pulse 90 10/03/19 1313  Resp 13 10/03/19 1316  SpO2 100 % 10/03/19 1313  Vitals shown include unvalidated device data.  Last Pain:  Vitals:   10/03/19 1115  TempSrc: Oral  PainSc: 0-No pain      Patients Stated Pain Goal: 0 (38/25/05 3976)  Complications: No apparent anesthesia complications

## 2019-10-03 NOTE — Consult Note (Signed)
WOC note: Pt had surgery today by the Vascular team; requested to change Vac on Wed.  [place] team will perform dressing change on 2/17 to left thigh. Thank-you,  Julien Girt MSN, [place], [place], [place], [place]

## 2019-10-03 NOTE — Op Note (Signed)
    OPERATIVE NOTE   PROCEDURE: 1. Irrigation and debridement of skin, soft tissue, and muscle negative pressure dressing change for approximately 150 cm  PRE-OPERATIVE DIAGNOSIS: Nonviable tissue and infection of left above-knee amputation stump  POST-OPERATIVE DIAGNOSIS: Same as above  SURGEON: [name], MD  ASSISTANT(S): Hezzie Bump, PA-C  ANESTHESIA: General  ESTIMATED BLOOD LOSS: 20 cc  FINDING(S): None  SPECIMEN(S): None  INDICATIONS:   Timothy Juarez is a 69 y.o. male who presents with a large left thigh wound after evacuation of an abscess and hematoma towards the end of last week, this was a very large cavity and he is brought back to the operating room for dressing change as well as continued irrigation and debridement. He remains on pressors from his sepsis from the infection. An assistant was present during the procedure to help facilitate the exposure and expedite the procedure.  DESCRIPTION: After obtaining full informed written consent, the patient was brought back to the operating room and placed supine upon the operating table.  The patient received IV antibiotics prior to induction.  After obtaining adequate anesthesia, the patient was prepped and draped in the standard fashion. The assistant provided retraction and mobilization to help facilitate exposure and expedite the procedure throughout the entire procedure.  This included dressing placement, using retractors, and optimizing lighting.  The wound was then opened and excisional debridement was performed to skin, soft tissue, and muscle to remove all clearly non-viable tissue.  The tissue was taken back to bleeding tissue that appeared viable. Although the skin incision was relatively small, there was a very large cavity particularly inferiorly and we cleaned this out with a combination of blunt dissection and irrigation. The debridement was performed with blunt dissection largely as well as electrocautery  and encompassed an area of approximately 150 cm2 although the total size was very difficult to discern due to the large cavity on the inside.  After all clearly non-viable tissue was removed, a silver VAC sponge was cut to fit the wound. Charlie Pitter was used and a good occlusive seal was obtained. The patient was then awakened from anesthesia and taken to the recovery room in stable condition having tolerated the procedure well.  COMPLICATIONS: none  CONDITION: stable  [name]  10/03/2019, 1:01 PM   This note was created with Dragon Medical transcription system. Any errors in dictation are purely unintentional.

## 2019-10-03 NOTE — Anesthesia Preprocedure Evaluation (Addendum)
Anesthesia Evaluation  Patient identified by MRN, date of birth, ID band Patient awake    Reviewed: Allergy & Precautions, H&P , NPO status , Patient's Chart, lab work & pertinent test results  Airway Mallampati: III  TM Distance: >3 FB     Dental  (+) Chipped, Poor Dentition, Missing   Pulmonary COPD, former smoker,           Cardiovascular hypertension, (-) angina+ Peripheral Vascular Disease and +CHF    PAD, PVD Ischemia of lower extremity  H/o CHF.  Echo 10/03/19 WNL. 1. Left ventricular ejection fraction, by estimation, is 60 to 65%. The  left ventricle has normal function. The left ventricle has no regional  wall motion abnormalities. Left ventricular diastolic parameters were  normal.  2. Right ventricular systolic function is normal. The right ventricular  size is normal. There is normal pulmonary artery systolic pressure.  3. The mitral valve is normal in structure and function. Trivial mitral  valve regurgitation.  4. The aortic valve is normal in structure and function. Aortic valve  regurgitation is not visualized.    Neuro/Psych negative neurological ROS  negative psych ROS   GI/Hepatic Neg liver ROS, GERD  ,  Endo/Other  negative endocrine ROS  Renal/GU      Musculoskeletal   Abdominal   Peds  Hematology  (+) Blood dyscrasia, anemia ,   Anesthesia Other Findings Wound vac change L thigh Access: R IJ TL CVC Gtts: dopamine 2.5 mcg/kg/min Resp: RA  Reproductive/Obstetrics negative OB ROS                           Anesthesia Physical Anesthesia Plan  ASA: III  Anesthesia Plan: General ETT   Post-op Pain Management:    Induction:   PONV Risk Score and Plan: Ondansetron, Dexamethasone and Treatment may vary due to age or medical condition  Airway Management Planned:   Additional Equipment:   Intra-op Plan:   Post-operative Plan:   Informed Consent: I have  reviewed the patients History and Physical, chart, labs and discussed the procedure including the risks, benefits and alternatives for the proposed anesthesia with the patient or authorized representative who has indicated his/her understanding and acceptance.     Dental Advisory Given  Plan Discussed with: Anesthesiologist  Anesthesia Plan Comments:         Anesthesia Quick Evaluation

## 2019-10-03 NOTE — Progress Notes (Signed)
Pharmacy Electrolyte Monitoring Consult:  Pharmacy consulted to assist in monitoring and replacing electrolytes in this 69 y.o. male admitted on 09/27/2019 with with abscess/soft tissue infection of left AKA stump. Patient is s/p irrigation and debridement on 2/11 with removal of stent.  Labs:  Sodium (mmol/L)  Date Value  10/03/2019 139  12/01/2013 136   Potassium (mmol/L)  Date Value  10/03/2019 3.7  12/01/2013 3.9   Magnesium (mg/dL)  Date Value  10/02/2019 1.9  04/01/2013 2.2   Phosphorus (mg/dL)  Date Value  10/02/2019 3.3  03/29/2013 3.9   Calcium (mg/dL)  Date Value  10/03/2019 7.9 (L)   Calcium, Total (mg/dL)  Date Value  12/01/2013 9.4   Albumin (g/dL)  Date Value  09/27/2019 2.8 (L)  03/25/2013 1.2 (L)    Assessment/Plan: No replacement indicated. Will follow up electrolytes with morning labs.  Will replace for goal potassium ~ 4, goal magnesium ~ 2, and goal phosphorus 2.5-3.   Pharmacy will continue to monitor and adjust per consult.    Tawnya Crook, PharmD 10/03/2019 2:37 PM

## 2019-10-03 NOTE — H&P (Signed)
[facility] History & Physical Update  The patient was interviewed and re-examined.  The patient's previous History and Physical has been reviewed and is unchanged.  There is no change in the plan of care. We plan to proceed with the scheduled procedure.  [name], MD  10/03/2019, 11:48 AM

## 2019-10-04 ENCOUNTER — Inpatient Hospital Stay: Payer: Self-pay

## 2019-10-04 DIAGNOSIS — I96 Gangrene, not elsewhere classified: Secondary | ICD-10-CM

## 2019-10-04 DIAGNOSIS — L02416 Cutaneous abscess of left lower limb: Secondary | ICD-10-CM | POA: Diagnosis not present

## 2019-10-04 DIAGNOSIS — Z89612 Acquired absence of left leg above knee: Secondary | ICD-10-CM | POA: Diagnosis not present

## 2019-10-04 DIAGNOSIS — D649 Anemia, unspecified: Secondary | ICD-10-CM | POA: Diagnosis not present

## 2019-10-04 DIAGNOSIS — L0291 Cutaneous abscess, unspecified: Secondary | ICD-10-CM | POA: Diagnosis not present

## 2019-10-04 DIAGNOSIS — Z978 Presence of other specified devices: Secondary | ICD-10-CM

## 2019-10-04 DIAGNOSIS — T8744 Infection of amputation stump, left lower extremity: Principal | ICD-10-CM

## 2019-10-04 DIAGNOSIS — Z452 Encounter for adjustment and management of vascular access device: Secondary | ICD-10-CM | POA: Diagnosis not present

## 2019-10-04 DIAGNOSIS — J984 Other disorders of lung: Secondary | ICD-10-CM | POA: Diagnosis not present

## 2019-10-04 DIAGNOSIS — Z9889 Other specified postprocedural states: Secondary | ICD-10-CM

## 2019-10-04 LAB — AEROBIC/ANAEROBIC CULTURE W GRAM STAIN (SURGICAL/DEEP WOUND)

## 2019-10-04 LAB — HEMOGLOBIN
Hemoglobin: 7.5 g/dL — ABNORMAL LOW (ref 13.0–17.0)
Hemoglobin: 7.7 g/dL — ABNORMAL LOW (ref 13.0–17.0)

## 2019-10-04 LAB — CBC
HCT: 25.8 % — ABNORMAL LOW (ref 39.0–52.0)
Hemoglobin: 8 g/dL — ABNORMAL LOW (ref 13.0–17.0)
MCH: 25.5 pg — ABNORMAL LOW (ref 26.0–34.0)
MCHC: 31 g/dL (ref 30.0–36.0)
MCV: 82.2 fL (ref 80.0–100.0)
Platelets: 386 10*3/uL (ref 150–400)
RBC: 3.14 MIL/uL — ABNORMAL LOW (ref 4.22–5.81)
RDW: 19.3 % — ABNORMAL HIGH (ref 11.5–15.5)
WBC: 17.9 10*3/uL — ABNORMAL HIGH (ref 4.0–10.5)
nRBC: 0.1 % (ref 0.0–0.2)

## 2019-10-04 LAB — BASIC METABOLIC PANEL
Anion gap: 5 (ref 5–15)
BUN: 10 mg/dL (ref 8–23)
CO2: 28 mmol/L (ref 22–32)
Calcium: 8.2 mg/dL — ABNORMAL LOW (ref 8.9–10.3)
Chloride: 109 mmol/L (ref 98–111)
Creatinine, Ser: 0.79 mg/dL (ref 0.61–1.24)
GFR calc Af Amer: >60 mL/min (ref >60)
GFR calc non Af Amer: >60 mL/min (ref >60)
Glucose, Bld: 149 mg/dL — ABNORMAL HIGH (ref 70–99)
Potassium: 3.7 mmol/L (ref 3.5–5.1)
Sodium: 142 mmol/L (ref 135–145)

## 2019-10-04 LAB — MAGNESIUM: Magnesium: 2.3 mg/dL (ref 1.7–2.4)

## 2019-10-04 LAB — VANCOMYCIN, PEAK: Vancomycin Pk: 38 ug/mL (ref 30–40)

## 2019-10-04 LAB — PHOSPHORUS: Phosphorus: 2.3 mg/dL — ABNORMAL LOW (ref 2.5–4.6)

## 2019-10-04 MED ORDER — POTASSIUM PHOSPHATES 15 MMOLE/5ML IV SOLN
10.0000 mmol | Freq: Once | INTRAVENOUS | Status: DC
  Filled 2019-10-04: qty 3.33

## 2019-10-04 MED ORDER — SODIUM CHLORIDE 0.9 % IV SOLN
2.0000 g | INTRAVENOUS | Status: DC
  Administered 2019-10-04 – 2019-10-06 (×3): 2 g via INTRAVENOUS
  Filled 2019-10-04 (×3): qty 2
  Filled 2019-10-04: qty 20

## 2019-10-04 MED ORDER — K PHOS MONO-SOD PHOS DI & MONO 155-852-130 MG PO TABS
500.0000 mg | ORAL_TABLET | ORAL | Status: AC
  Administered 2019-10-04 (×2): 500 mg via ORAL
  Filled 2019-10-04 (×3): qty 2

## 2019-10-04 NOTE — Progress Notes (Addendum)
Pharmacy Electrolyte Monitoring Consult:  Pharmacy consulted to assist in monitoring and replacing electrolytes in this 69 y.o. male admitted on 09/27/2019 with with abscess/soft tissue infection of left AKA stump. Patient is s/p irrigation and debridement on 2/11 with removal of stent.  Labs:  Sodium (mmol/L)  Date Value  10/04/2019 142  12/01/2013 136   Potassium (mmol/L)  Date Value  10/04/2019 3.7  12/01/2013 3.9   Magnesium (mg/dL)  Date Value  10/04/2019 2.3  04/01/2013 2.2   Phosphorus (mg/dL)  Date Value  10/04/2019 2.3 (L)  03/29/2013 3.9   Calcium (mg/dL)  Date Value  10/04/2019 8.2 (L)   Calcium, Total (mg/dL)  Date Value  12/01/2013 9.4   Albumin (g/dL)  Date Value  09/27/2019 2.8 (L)  03/25/2013 1.2 (L)    Assessment/Plan: Goal potassium ~ 4, goal magnesium ~ 2, and goal phosphorus 2.5-3.  K phos IV changed to PO supplementation for two doses. Will follow up with morning labs.  Pharmacy will continue to monitor and adjust per consult.  Dorena Bodo, PharmD 10/04/2019 11:10 AM

## 2019-10-04 NOTE — Progress Notes (Signed)
Patient made aware of transfer to floor bed. Patient transported to room 150 via bed. Thorough report given to [place].

## 2019-10-04 NOTE — Treatment Plan (Addendum)
Diagnosis: Left AKA site infection along with endovascular infection Baseline Creatinine 0.79  Culture Result: dipheroids  No Known Allergies  OPAT Orders Discharge antibiotics: Ceftriaxone 2 grams Iv every 24 hours Vancomycin 1250mg  IVPB every 24 hours Consult your pharmacist for vancomycin dosing  Aim for Vancomycin trough 10-15 (unless otherwise indicated) Duration: 4 weeks End Date: 10/27/19   [facility] Care Per Protocol:including placement of biopatch  Labs Monday and Thursday weekly while on IV antibiotics: __X__ BMP __X_ Vancomycin trough  Labs every Monday CBC with diff/platelet  _X_ Please pull PIC at completion of IV antibiotics  Fax weekly labs to [place]  [phone]  Call [phone] to make virtual appt

## 2019-10-04 NOTE — Progress Notes (Addendum)
PHARMACY CONSULT NOTE FOR:  OUTPATIENT  PARENTERAL ANTIBIOTIC THERAPY (OPAT)  Indication: Left AKA site infection along with endovascular infection Regimen: Ceftriaxone 2 grams Iv every 24 hours Vancomycin 1500mg  IVPB every 24 hours (adjusted to 1250mg  q24h - see 10/05/19 note)  Consult your pharmacist for vancomycin dosing  Aim for Vancomycin trough 10-15 (unless otherwise indicated) End date: 10/27/2019(4 weeks)  IV antibiotic discharge orders are pended. To discharging provider:  please sign these orders via discharge navigator,  Select New Orders & click on the button choice - Manage This Unsigned Work.     Thank you for allowing pharmacy to be a part of this patient's care.  Pearla Dubonnet 10/04/2019, 6:44 PM   Lu Duffel, PharmD, BCPS Clinical Pharmacist 10/05/2019 12:15 PM

## 2019-10-04 NOTE — Evaluation (Signed)
Physical Therapy Evaluation Patient Details Name: Timothy Juarez MRN: [id number] DOB: 02/21/1951 Today's Date: 10/04/2019   History of Present Illness  Pt admitted for abscess of L AKA residual limb and is s/p I&D of abscess with removal of multiple stents on 09/29/19.  Extensive PMH and increased LOS this admission requiring CCU admission.   Clinical Impression  Pt is a pleasant 69 year old male who was admitted for L AKA abscess. Pt performs bed mobility/transfers with min assist. At baseline, he is WC level. Pt demonstrates deficits with strength/pain in residual limb/mobility. HR increased significantly with exertion. Would benefit from skilled PT to address above deficits and promote optimal return to PLOF; recommend transition to STR upon discharge from acute hospitalization.     Follow Up Recommendations SNF(return back to LTC with PT)    Equipment Recommendations  None recommended by PT    Recommendations for Other Services       Precautions / Restrictions Precautions Precautions: Fall Restrictions Weight Bearing Restrictions: Yes [name] Weight Bearing: Non weight bearing      Mobility  Bed Mobility Overal bed mobility: Needs Assistance Bed Mobility: Supine to Sit     Supine to sit: Min assist     General bed mobility comments: assistance and guidance provided for sequencing and scooting out towards EOB. Once seated at EOB, able to sit with upright posture.  Transfers Overall transfer level: Needs assistance Equipment used: None Transfers: Sit to/from Stand Sit to Stand: Min assist         General transfer comment: able to squat pivot transfer while holding onto railing with R hand. Safe technique. HR increases to 133bpm with exertion, quickly returns back to 76bpm while at rest.  Ambulation/Gait             General Gait Details: wc level at baseline  Stairs            Wheelchair Mobility    Modified Rankin (Stroke Patients Only)        Balance Overall balance assessment: Needs assistance Sitting-balance support: Feet supported;Single extremity supported Sitting balance-Leahy Scale: Good     Standing balance support: Single extremity supported Standing balance-Leahy Scale: Fair                               Pertinent Vitals/Pain Pain Assessment: Faces Faces Pain Scale: Hurts little more Pain Location: L LE Pain Descriptors / Indicators: Operative site guarding Pain Intervention(s): Limited activity within patient's tolerance    Home Living Family/patient expects to be discharged to:: Skilled nursing facility                 Additional Comments: from [facility]    Prior Function Level of Independence: Independent with assistive device(s)         Comments: Pt uses w/c at [facility] but is independent with transfers; staff assist with showering; pt is able to sopnge bath himself; staff assists with LB dressing     Hand Dominance        Extremity/Trunk Assessment   Upper Extremity Assessment Upper Extremity Assessment: Overall WFL for tasks assessed    Lower Extremity Assessment Lower Extremity Assessment: Generalized weakness(R LE grossly 4/5; L LE not tested)       Communication   Communication: No difficulties  Cognition Arousal/Alertness: Awake/alert Behavior During Therapy: WFL for tasks assessed/performed Overall Cognitive Status: Within Functional Limits for tasks assessed  General Comments      Exercises Other Exercises Other Exercises: Educated and performance of seated R LE ther-ex including SLRs, LAQ, and AP. All ther-ex performed 5-10 reps with supervision   Assessment/Plan    PT Assessment Patient needs continued PT services  PT Problem List Decreased strength;Decreased balance;Decreased mobility       PT Treatment Interventions DME instruction;Gait training;Therapeutic activities;Therapeutic  exercise    PT Goals (Current goals can be found in the Care Plan section)  Acute Rehab PT Goals Patient Stated Goal: to go back to LTC PT Goal Formulation: With patient Time For Goal Achievement: 10/18/19 Potential to Achieve Goals: Good    Frequency Min 2X/week   Barriers to discharge        Co-evaluation               AM-PAC PT "6 Clicks" Mobility  Outcome Measure Help needed turning from your back to your side while in a flat bed without using bedrails?: A Little Help needed moving from lying on your back to sitting on the side of a flat bed without using bedrails?: A Little Help needed moving to and from a bed to a chair (including a wheelchair)?: A Little Help needed standing up from a chair using your arms (e.g., wheelchair or bedside chair)?: A Little Help needed to walk in hospital room?: A Lot Help needed climbing 3-5 steps with a railing? : Total 6 Click Score: 15    End of Session Equipment Utilized During Treatment: Gait belt Activity Tolerance: Patient tolerated treatment well Patient left: in chair Nurse Communication: Mobility status PT Visit Diagnosis: Unsteadiness on feet (R26.81);Muscle weakness (generalized) (M62.81);Difficulty in walking, not elsewhere classified (R26.2)    Time: 3845-3646 PT Time Calculation (min) (ACUTE ONLY): 26 min   Charges:   PT Evaluation $PT Eval Moderate Complexity: 1 Mod PT Treatments $Therapeutic Exercise: 8-22 mins        Timothy Juarez, PT, DPT [phone]   Timothy Juarez 10/04/2019, 5:07 PM

## 2019-10-04 NOTE — Anesthesia Postprocedure Evaluation (Signed)
Anesthesia Post Note  Patient: Timothy Juarez  Procedure(s) Performed: LEFT THIGH VACUUM ASSISTED CLOSURE CHANGE (Left )  Patient location during evaluation: SICU Anesthesia Type: General Level of consciousness: sedated Pain management: pain level controlled Vital Signs Assessment: post-procedure vital signs reviewed and stable Respiratory status: patient remains intubated per anesthesia plan Cardiovascular status: stable Postop Assessment: no apparent nausea or vomiting Anesthetic complications: no     Last Vitals:  Vitals:   10/04/19 0300 10/04/19 0400  BP: (!) 159/60   Pulse: (!) 49   Resp: 10   Temp:  (!) 36.4 C  SpO2: 100%     Last Pain:  Vitals:   10/04/19 0400  TempSrc: Oral  PainSc: 0-No pain                 Erlin Gardella Lorenza Chick

## 2019-10-04 NOTE — Progress Notes (Signed)
ID  Patient stable No fever No pain   BP (!) 120/43   Pulse 88   Temp 97.6 F (36.4 C) (Oral)   Resp 15   Ht 6' 0.99" (1.854 m)   Wt 95.3 kg   SpO2 100%   BMI 27.71 kg/m    O/e Awake and alert Tachycardia Left stump wound vac      CBC Latest Ref Rng & Units 10/04/2019 10/04/2019 10/03/2019  WBC 4.0 - 10.5 K/uL - 17.9(H) -  Hemoglobin 13.0 - 17.0 g/dL 7.5(L) 8.0(L) 7.7(L)  Hematocrit 39.0 - 52.0 % - 25.8(L) -  Platelets 150 - 400 K/uL - 386 -     CMP Latest Ref Rng & Units 10/04/2019 10/03/2019 10/02/2019  Glucose 70 - 99 mg/dL 149(H) 150(H) 148(H)  BUN 8 - 23 mg/dL 10 9 9   Creatinine 0.61 - 1.24 mg/dL 0.79 0.83 0.97  Sodium 135 - 145 mmol/L 142 139 139  Potassium 3.5 - 5.1 mmol/L 3.7 3.7 3.4(L)  Chloride 98 - 111 mmol/L 109 108 108  CO2 22 - 32 mmol/L 28 24 26   Calcium 8.9 - 10.3 mg/dL 8.2(L) 7.9(L) 7.4(L)  Total Protein 6.5 - 8.1 g/dL - - -  Total Bilirubin 0.3 - 1.2 mg/dL - - -  Alkaline Phos 38 - 126 U/L - - -  AST 15 - 41 U/L - - -  ALT 0 - 44 U/L - - -    Impression/recommendation Left AKA stump infection Pathology showed necrosis.  Cultures have been negative except for diphtheroids. As stents  in SFA were exposed we will have to treat this like an endovascular infection. Patient was on Augmentin as outpatient and hence the cultures may have been negative.  He is currently on vancomycin and Zosyn and the latter will be changed to ceftriaxone.   The 2 IV antibiotics will be needed for at least 4 weeks.  Discussed with Dr. Lucky Cowboy.  Anemia following surgery.  Has received PRBC.[name] Discussed the management with the care team

## 2019-10-04 NOTE — Progress Notes (Signed)
Spoke primary RN Jinny Blossom that  PICC will be place tomorrow.

## 2019-10-04 NOTE — Progress Notes (Addendum)
**Note Timothy-Identified via Obfuscation** Timothy Juarez at [place] NAME: Timothy Juarez    MR#:  [id number]  DATE OF BIRTH:  1950-12-11  SUBJECTIVE:   Pt is Juarez irrigation and debridement of left above knee amputation abscess with removal of multiple stents.  Wound vac change in OR today (2nd time) HR 59-60 now off dopamine gtt since 8 am  Pt is much better spirits, eating well.   REVIEW OF SYSTEMS:   Review of Systems  Constitutional: Negative for chills, fever and weight loss.  HENT: Negative for ear discharge, ear pain and nosebleeds.   Eyes: Negative for blurred vision, pain and discharge.  Respiratory: Negative for sputum production, shortness of breath, wheezing and stridor.   Cardiovascular: Negative for chest pain, palpitations, orthopnea and PND.  Gastrointestinal: Negative for abdominal pain, diarrhea, nausea and vomiting.  Genitourinary: Negative for frequency and urgency.  Musculoskeletal: Negative for back pain and joint pain.  Neurological: Negative for sensory change, speech change, focal weakness and weakness.  Psychiatric/Behavioral: Negative for depression and hallucinations. The patient is not nervous/anxious.    Tolerating Diet:yes Tolerating PT: does not walk at baseline. Does bed to WC transfers  DRUG ALLERGIES:  No Known Allergies  VITALS:  Blood pressure (!) 136/54, pulse (!) 50, temperature 97.6 F (36.4 C), temperature source Oral, resp. rate 15, height 6' 0.99" (1.854 m), weight 95.3 kg, SpO2 100 %.  PHYSICAL EXAMINATION:   Physical Exam  GENERAL:  69 y.o.-year-old patient lying in the bed with no acute distress. Obese Critically ill EYES: Pupils equal, round, reactive to light and accommodation. No scleral icterus.   HEENT: Head atraumatic, normocephalic. Oropharynx and nasopharynx clear.  NECK:  Supple, no jugular venous distention. No thyroid enlargement, no tenderness. Right IJ on 09/30/2019 LUNGS: Normal breath sounds bilaterally, no  wheezing, rales, rhonchi. No use of accessory muscles of respiration.  CARDIOVASCULAR: S1, S2 normal. No murmurs, rubs, or gallops.  ABDOMEN: Soft, nontender, nondistended. Bowel sounds present. No organomegaly or mass. Foley placed 09/30/19 due to urinary retention EXTREMITIES: left amputation stump, oozing blood+ On feb 11th   On feb 12th     NEUROLOGIC: Cranial nerves II through XII are intact. Moves all extremities well PSYCHIATRIC:  patient is alert and oriented x 3.  SKIN: as above  LABORATORY PANEL:  CBC Recent Labs  Lab 10/04/19 0340 10/04/19 0340 10/04/19 0802  WBC 17.9*  --   --   HGB 8.0*   < > 7.5*  HCT 25.8*  --   --   PLT 386  --   --    < > = values in this interval not displayed.    Chemistries  Recent Labs  Lab 09/27/19 2143 09/28/19 0225 10/04/19 0340  NA 138   < > 142  K 3.5   < > 3.7  CL 103   < > 109  CO2 24   < > 28  GLUCOSE 111*   < > 149*  BUN 15   < > 10  CREATININE 1.26*   < > 0.79  CALCIUM 8.7*   < > 8.2*  MG  --    < > 2.3  AST 18  --   --   ALT 13  --   --   ALKPHOS 122  --   --   BILITOT 0.9  --   --    < > = values in this interval not displayed.   Cardiac Enzymes No results for input(s): TROPONINI  in the last 168 hours. RADIOLOGY:  No results found. ASSESSMENT AND PLAN:  Timothy Juarez is a 69 y.o. male with medical history significant for COPD, diastolic heart failure, hospitalized for Covid Pneumonia in May 2020, history of recurrent DVT on Eliquis, hypertension, with history of peripheral vascular disease with left SFA stent with history of left AKA, who was sent into the emergency room by vascular surgeon Timothy Juarez due to concern for abscess at the left AKA stump, probably related to old stent and SFA  Abscess/Hematoma  of left AKA stump, with history of SFA stent left thigh -ID consultation appreciated--Treating as likely Endovascular infection and will need IV vanc and Ceftriaxone for 4 weeks per ID -d/c Right IJ--place  PICC line today -Deep surgical wound culture 09/29/19--few WBC, rare GNR--Diptheroids -BC negative -Timothy Juarez irrigation and debridement of left above knee amputation abscess/hematoma with removal of stent on 09/29/2019 -status post dressing change with wound VAC placement 09/30/2019  -CT femur on admission left shows heterogeneous collection of the left thigh, measuring 10.4 x 17.1 x 6.2 cm, likely a combination of hematoma and abscess. The collection extends to the medial thigh skin surface -Juarez wound vac change x2  in OR -Wound vac will be changed by Wound nurse on feb 17th and then will need change MWF and f/u vascular as outpt  Acute blood loss anemia secondary to post hemorrhage/abscess/hematoma over left AKA stump -came in with hemoglobin of 11.2--10.4--OR--9.3--7.7--BT--8.7--7.7--8.0--8.4--7.5  Post op hypotension suspected due to volume loss with blood loss -off IV Norepinephrine  -now off low dose Iv dopamine (due to bradycardia) -received Iv albumin, midodrine and solucortef to help with intravascular volume and BP--stable  AKI (acute kidney injury) (HCC)--resolved -Suspect prerenal -IV hydration and monitor renal function -creat 1.26--1.1 -good uop    PAD (peripheral artery disease) (HCC) -On aspirin and atorvastatin     (HFpEF) heart failure with preserved ejection fraction (HCC) -EF 60 to 65% in February 2019 -well compensated -holding diuretics at present   COPD with chronic bronchitis ([place]) Not acutely exacerbated Continue home bronchodilator treatments.  DuoNebs as needed    Chronic anticoagulation with eliquis for history of DVT -resume po eliquis from 10/04/2019 per Dr dew   urinary Retention as noted on Bladder scan per RN -start po flomax Foley placed  Day 5--order placed to remove foley on 2/17  Foley day 5 Right IJ  Day 5 (09/30/2019)  DVT prophylaxis: eliquis  Code Status: full code  Family Communication: dter Timothy Juarez today Disposition Plan:  Back to previous SNF environment--WOM on Thursday  2/17 if stable after wound vac is changed Consults called: Timothy Juarez  Barriers to discharge:none   TOTAL TIME TAKING CARE OF THIS PATIENT: *30* minutes.  >50% time spent on counselling and coordination of care  Note: This dictation was prepared with Dragon dictation along with smaller phrase technology. Any transcriptional errors that result from this process are unintentional.  Fritzi Mandes M.D    [facility]   CC: Primary care physician; Alvester Morin, MDPatient ID: Mikel Cella, male   DOB: April 25, 1951, 69 y.o.   MRN: [id number]

## 2019-10-04 NOTE — Progress Notes (Signed)
[facility] Daily Progress Note   Subjective: 10/03/19: 1. Irrigation and debridement of skin, soft tissue, and muscle negative pressure dressing change for approximately 150 cm  09/30/19: 1. Right IJ triple lumen central venous catheter placement 2. Right IJ cannulation under ultrasound guidance 3. Wound washout with VAC placement  09/29/19: 1. Irrigation and debridement of left above-knee amputation abscess with excision of multiple stents  Patient without complaint this AM.   Objective: Vitals:   10/04/19 0800 10/04/19 0900 10/04/19 1000 10/04/19 1100  BP: (!) 121/54 (!) 106/54 (!) 110/54 (!) 145/78  Pulse: 60 (!) 59 (!) 55 (!) 58  Resp: 11 14 12    Temp: 97.9 F (36.6 C)     TempSrc: Oral     SpO2: 100% 100% 100% 100%  Weight:      Height:        Intake/Output Summary (Last 24 hours) at 10/04/2019 1105 Last data filed at 10/04/2019 8875 Gross per 24 hour  Intake 792.74 ml  Output 850 ml  Net -57.26 ml   Physical Exam: A&Ox3, NAD CV: RRR Pulmonary: CTA Bilaterally Abdomen: Soft, Nontender, Nondistended Vascular:  Left Thigh: Moderate edema. VAC intact and to suction   Laboratory: CBC    Component Value Date/Time   WBC 17.9 (H) 10/04/2019 0340   HGB 7.5 (L) 10/04/2019 0802   HGB 14.3 12/01/2013 1317   HCT 25.8 (L) 10/04/2019 0340   HCT 43.9 12/01/2013 1317   PLT 386 10/04/2019 0340   PLT 250 12/01/2013 1317   BMET    Component Value Date/Time   NA 142 10/04/2019 0340   NA 136 12/01/2013 1317   K 3.7 10/04/2019 0340   K 3.9 12/01/2013 1317   CL 109 10/04/2019 0340   CL 100 12/01/2013 1317   CO2 28 10/04/2019 0340   CO2 33 (H) 12/01/2013 1317   GLUCOSE 149 (H) 10/04/2019 0340   GLUCOSE 91 12/01/2013 1317   BUN 10 10/04/2019 0340   BUN 24 (H) 09/25/2014 0846   CREATININE 0.79 10/04/2019 0340   CREATININE 1.29 09/25/2014 0846   CALCIUM 8.2 (L) 10/04/2019 0340   CALCIUM 9.4 12/01/2013 1317   GFRNONAA >60 10/04/2019 0340   GFRNONAA 60 (L) 09/25/2014 0846   GFRNONAA >60 03/16/2014 0750   GFRAA >60 10/04/2019 0340   GFRAA >60 09/25/2014 0846   GFRAA >60 03/16/2014 0750   Assessment/Planning: The patient is a 69 year old male who presented with hematoma/abscess to the left thigh status post debridement/washout x3 and VAC dressing placement 1) No further plans for VAC changes in OR at this time. I have consulted the [place] nurses to continue VAC changes M-W-F.   Will continue to follow along.  Discussed with Dr. Ellis Parents Jonia Oakey PA-C 10/04/2019 11:05 AM

## 2019-10-05 DIAGNOSIS — R197 Diarrhea, unspecified: Secondary | ICD-10-CM | POA: Diagnosis not present

## 2019-10-05 DIAGNOSIS — D72829 Elevated white blood cell count, unspecified: Secondary | ICD-10-CM

## 2019-10-05 DIAGNOSIS — L02416 Cutaneous abscess of left lower limb: Secondary | ICD-10-CM | POA: Diagnosis not present

## 2019-10-05 DIAGNOSIS — Z7901 Long term (current) use of anticoagulants: Secondary | ICD-10-CM

## 2019-10-05 DIAGNOSIS — I5030 Unspecified diastolic (congestive) heart failure: Secondary | ICD-10-CM | POA: Diagnosis not present

## 2019-10-05 DIAGNOSIS — T8744 Infection of amputation stump, left lower extremity: Secondary | ICD-10-CM | POA: Diagnosis not present

## 2019-10-05 DIAGNOSIS — D649 Anemia, unspecified: Secondary | ICD-10-CM | POA: Diagnosis not present

## 2019-10-05 DIAGNOSIS — N179 Acute kidney failure, unspecified: Secondary | ICD-10-CM | POA: Diagnosis not present

## 2019-10-05 DIAGNOSIS — L0291 Cutaneous abscess, unspecified: Secondary | ICD-10-CM | POA: Diagnosis not present

## 2019-10-05 LAB — BASIC METABOLIC PANEL
Anion gap: 7 (ref 5–15)
BUN: 12 mg/dL (ref 8–23)
CO2: 27 mmol/L (ref 22–32)
Calcium: 7.9 mg/dL — ABNORMAL LOW (ref 8.9–10.3)
Chloride: 109 mmol/L (ref 98–111)
Creatinine, Ser: 0.94 mg/dL (ref 0.61–1.24)
GFR calc Af Amer: >60 mL/min (ref >60)
GFR calc non Af Amer: >60 mL/min (ref >60)
Glucose, Bld: 108 mg/dL — ABNORMAL HIGH (ref 70–99)
Potassium: 2.9 mmol/L — ABNORMAL LOW (ref 3.5–5.1)
Sodium: 143 mmol/L (ref 135–145)

## 2019-10-05 LAB — AEROBIC/ANAEROBIC CULTURE W GRAM STAIN (SURGICAL/DEEP WOUND)

## 2019-10-05 LAB — VANCOMYCIN, RANDOM: Vancomycin Rm: 19

## 2019-10-05 LAB — HEMOGLOBIN
Hemoglobin: 7.6 g/dL — ABNORMAL LOW (ref 13.0–17.0)
Hemoglobin: 7.7 g/dL — ABNORMAL LOW (ref 13.0–17.0)

## 2019-10-05 LAB — MAGNESIUM: Magnesium: 2.2 mg/dL (ref 1.7–2.4)

## 2019-10-05 LAB — PHOSPHORUS: Phosphorus: 2.3 mg/dL — ABNORMAL LOW (ref 2.5–4.6)

## 2019-10-05 MED ORDER — VANCOMYCIN IV (FOR PTA / DISCHARGE USE ONLY): 1250.0000 mg | INTRAVENOUS | 0 refills | Status: AC

## 2019-10-05 MED ORDER — POTASSIUM PHOSPHATE MONOBASIC 500 MG PO TABS
1000.0000 mg | ORAL_TABLET | ORAL | Status: AC
  Administered 2019-10-05 (×2): 1000 mg via ORAL
  Filled 2019-10-05 (×2): qty 2

## 2019-10-05 MED ORDER — HYDROCODONE-ACETAMINOPHEN 5-325 MG PO TABS: 1.0000 | ORAL_TABLET | Freq: Three times a day (TID) | ORAL | 0 refills | Status: DC | PRN

## 2019-10-05 MED ORDER — SODIUM CHLORIDE 0.9% FLUSH: 10.0000 mL | INTRAVENOUS | Status: DC | PRN

## 2019-10-05 MED ORDER — SODIUM CHLORIDE 0.9% FLUSH
10.0000 mL | Freq: Two times a day (BID) | INTRAVENOUS | Status: DC
  Administered 2019-10-05 – 2019-10-07 (×5): 10 mL

## 2019-10-05 MED ORDER — POTASSIUM CHLORIDE CRYS ER 20 MEQ PO TBCR: 40.0000 meq | EXTENDED_RELEASE_TABLET | Freq: Once | ORAL | Status: DC

## 2019-10-05 MED ORDER — CEFTRIAXONE IV (FOR PTA / DISCHARGE USE ONLY): 2.0000 g | INTRAVENOUS | 0 refills | Status: AC

## 2019-10-05 MED ORDER — POTASSIUM CHLORIDE 10 MEQ/100ML IV SOLN
10.0000 meq | INTRAVENOUS | Status: AC
  Administered 2019-10-05 (×4): 10 meq via INTRAVENOUS
  Filled 2019-10-05 (×3): qty 100

## 2019-10-05 MED ORDER — LOPERAMIDE HCL 2 MG PO CAPS
2.0000 mg | ORAL_CAPSULE | Freq: Four times a day (QID) | ORAL | Status: DC | PRN
  Administered 2019-10-05: 2 mg via ORAL
  Filled 2019-10-05 (×2): qty 1

## 2019-10-05 MED ORDER — VANCOMYCIN IV (FOR PTA / DISCHARGE USE ONLY): 1500.0000 mg | INTRAVENOUS | 0 refills | Status: DC

## 2019-10-05 MED ORDER — TAMSULOSIN HCL 0.4 MG PO CAPS: 0.4000 mg | ORAL_CAPSULE | Freq: Every day | ORAL | 0 refills | Status: AC

## 2019-10-05 MED ORDER — MORPHINE SULFATE (PF) 2 MG/ML IV SOLN
1.0000 mg | INTRAVENOUS | Status: AC
  Administered 2019-10-05: 1 mg via INTRAVENOUS
  Filled 2019-10-05: qty 1

## 2019-10-05 MED ORDER — VANCOMYCIN HCL 1250 MG/250ML IV SOLN
1250.0000 mg | INTRAVENOUS | Status: DC
  Administered 2019-10-05 – 2019-10-06 (×2): 1250 mg via INTRAVENOUS
  Filled 2019-10-05 (×3): qty 250

## 2019-10-05 MED ORDER — IPRATROPIUM-ALBUTEROL 0.5-2.5 (3) MG/3ML IN SOLN
3.0000 mL | Freq: Two times a day (BID) | RESPIRATORY_TRACT | Status: DC
  Administered 2019-10-05 – 2019-10-07 (×4): 3 mL via RESPIRATORY_TRACT
  Filled 2019-10-05 (×4): qty 3

## 2019-10-05 MED ORDER — POTASSIUM CHLORIDE CRYS ER 20 MEQ PO TBCR
40.0000 meq | EXTENDED_RELEASE_TABLET | Freq: Once | ORAL | Status: AC
  Administered 2019-10-05: 40 meq via ORAL
  Filled 2019-10-05: qty 2

## 2019-10-05 NOTE — Progress Notes (Addendum)
Pharmacy Electrolyte Monitoring Consult:  Pharmacy consulted to assist in monitoring and replacing electrolytes in this 69 y.o. male admitted on 09/27/2019 with with abscess/soft tissue infection of left AKA stump. Patient is s/p irrigation and debridement on 2/11 with removal of stent.  Labs:  Sodium (mmol/L)  Date Value  10/05/2019 143  12/01/2013 136   Potassium (mmol/L)  Date Value  10/05/2019 2.9 (L)  12/01/2013 3.9   Magnesium (mg/dL)  Date Value  10/05/2019 2.2  04/01/2013 2.2   Phosphorus (mg/dL)  Date Value  10/05/2019 2.3 (L)  03/29/2013 3.9   Calcium (mg/dL)  Date Value  10/05/2019 7.9 (L)   Calcium, Total (mg/dL)  Date Value  12/01/2013 9.4   Albumin (g/dL)  Date Value  09/27/2019 2.8 (L)  03/25/2013 1.2 (L)    Assessment/Plan: Goal potassium ~ 4, goal magnesium ~ 2, and goal phosphorus 2.5-3.  Will give KCl 40 meq po and IV 10 meq x 4  Will give K phos PO supplementation for two doses.   Will follow up potassium and phos with morning labs.  Pharmacy will continue to monitor and adjust per consult.  Lu Duffel, PharmD, BCPS Clinical Pharmacist 10/05/2019 7:13 AM

## 2019-10-05 NOTE — TOC Progression Note (Signed)
Transition of Care [facility]) - Progression Note    Patient Details  Name: Timothy Juarez MRN: [id number] Date of Birth: [birth year]  Transition [facility]) CM/SW Contact  Su Hilt, RN Phone Number: 10/05/2019, 1:26 PM  Clinical Narrative:   Myrene Buddy and left a voice mail for [place] with the Wound Vac settings         Expected Discharge Plan and Services           Expected Discharge Date: 10/05/19                                     Social Determinants of Health (SDOH) Interventions    Readmission Risk Interventions No flowsheet data found.

## 2019-10-05 NOTE — Progress Notes (Signed)
Patient refused to be turned. Could not assess his backside or wound site.

## 2019-10-05 NOTE — TOC Progression Note (Signed)
Transition of Care [facility]) - Progression Note    Patient Details  Name: Timothy Juarez MRN: [id number] Date of Birth: August 08, 1951  Transition of Care [facility]) CM/SW [place], RN Phone Number: 10/05/2019, 12:27 PM  Clinical Narrative:   Myrene Buddy again to verify they can take the patient back today and will need a wound Vac, left a VM for a call back and provided my contact information         Expected Discharge Plan and Services           Expected Discharge Date: 10/05/19                                     Social Determinants of Health (SDOH) Interventions    Readmission Risk Interventions No flowsheet data found.

## 2019-10-05 NOTE — Consult Note (Signed)
[place] Nurse wound follow up Wound type: surgical  Measurement:9cm x 4cm x 4cm with tunnel extending 12 + cm at 4-5 o'clock  Wound SHU:OHFGB, pink, moist, bleeds easily after NPWT foam removed Drainage (amount, consistency, odor) dark serosanguinous in the canister  Periwound: intact, dry skin, painful Dressing procedure/placement/frequency: Removed 3 pieces of silver foam from wound bed  Filled wound with  _1__ piece of black foam that was guided into tunneled area, however patient's pain would not allow for me to fully pack area. Attempted to pack as completely as possible  Sealed NPWT dressing at 134mm HG Patient received IVpain medication per bedside nurse prior to dressing change Patient tolerated procedure  WOC nurse will continue to provide NPWT dressing changed due to the complexity of the dressing change.   When ready for DC to [facility], clamp tubing to dressing and cover with glove for transport.  To be hooked to SNF machine upon arrival to facility.   CM aware.  [place], [place], [place]

## 2019-10-05 NOTE — Progress Notes (Signed)
Patient reports several loose stools today , this writer only observed one that was liquid like and tarry color , patient does take iron supplement. No mucous or C. Difficile odor noted. Incontinence care provided by staff.

## 2019-10-05 NOTE — TOC Progression Note (Signed)
Transition of Care [facility]) - Progression Note    Patient Details  Name: Timothy Juarez MRN: [id number] Date of Birth: 04-03-1951  Transition of Care [facility]) CM/SW [place], RN Phone Number: 10/05/2019, 11:48 AM  Clinical Narrative:   [name] at [facility] to verify that the patient can come to the facility today, unable to reach, left a VM and requested a call back, provided my contact information         Expected Discharge Plan and Services                                                 Social Determinants of Health (SDOH) Interventions    Readmission Risk Interventions No flowsheet data found.

## 2019-10-05 NOTE — TOC Progression Note (Signed)
Transition of Care [facility]) - Progression Note    Patient Details  Name: AQEEL NORGAARD MRN: [id number] Date of Birth: 10/12/1950  [facility]) CM/SW Contact  Su Hilt, RN Phone Number: 10/05/2019, 2:12 PM  Clinical Narrative:   Damaris Schooner with Tiffany at [facility], they do not have the Wound Vac and won't have until at the Earliest Thursday, She will let me know once they get it, I notified the physician of this         Expected Discharge Plan and Services           Expected Discharge Date: 10/05/19                                     Social Determinants of Health (SDOH) Interventions    Readmission Risk Interventions No flowsheet data found.

## 2019-10-05 NOTE — Progress Notes (Signed)
Timothy Juarez at [place] NAME: Timothy Juarez    MR#:  [id number]  DATE OF BIRTH:  1951-08-03  SUBJECTIVE:  Pt is s/p irrigation and debridement of left above knee amputation abscess with removal of multiple stents.  Wound vac NPWT dressing changed per wound VAC nurse today at bedside  Pt is much better spirits, eating well.  No new issues  REVIEW OF SYSTEMS:   Review of Systems  Constitutional: Negative for chills, fever and weight loss.  HENT: Negative for ear discharge, ear pain and nosebleeds.   Eyes: Negative for blurred vision, pain and discharge.  Respiratory: Negative for sputum production, shortness of breath, wheezing and stridor.   Cardiovascular: Negative for chest pain, palpitations, orthopnea and PND.  Gastrointestinal: Negative for abdominal pain, diarrhea, nausea and vomiting.  Genitourinary: Negative for frequency and urgency.  Musculoskeletal: Negative for back pain and joint pain.  Neurological: Negative for sensory change, speech change, focal weakness and weakness.  Psychiatric/Behavioral: Negative for depression and hallucinations. The patient is not nervous/anxious.    Tolerating Diet:yes Tolerating PT: does not walk at baseline. Does bed to WC transfers  DRUG ALLERGIES:  No Known Allergies  VITALS:  Blood pressure (!) 106/49, pulse 81, temperature 98.1 F (36.7 C), temperature source Oral, resp. rate 16, height 6' 0.99" (1.854 m), weight 95.3 kg, SpO2 97 %.  PHYSICAL EXAMINATION:   Physical Exam  GENERAL:  69 y.o.-year-old patient lying in the bed with no acute distress. Obese Critically ill EYES: Pupils equal, round, reactive to light and accommodation. No scleral icterus.   HEENT: Head atraumatic, normocephalic. Oropharynx and nasopharynx clear.  NECK:  Supple, no jugular venous distention. No thyroid enlargement, no tenderness LUNGS: Normal breath sounds bilaterally, no wheezing, rales, rhonchi. No use of  accessory muscles of respiration.  CARDIOVASCULAR: S1, S2 normal. No murmurs, rubs, or gallops.  ABDOMEN: Soft, nontender, nondistended. Bowel sounds present. No organomegaly or mass.  EXTREMITIES: left amputation stump, oozing blood+ On feb 11th   On feb 12th     NEUROLOGIC: Cranial nerves II through XII are intact. Moves all extremities well PSYCHIATRIC:  patient is alert and oriented x 3.  SKIN: as above  LABORATORY PANEL:  CBC Recent Labs  Lab 10/04/19 0340 10/04/19 0802 10/05/19 0918  WBC 17.9*  --   --   HGB 8.0*   < > 7.6*  HCT 25.8*  --   --   PLT 386  --   --    < > = values in this interval not displayed.    Chemistries  Recent Labs  Lab 10/05/19 0505  NA 143  K 2.9*  CL 109  CO2 27  GLUCOSE 108*  BUN 12  CREATININE 0.94  CALCIUM 7.9*  MG 2.2   Cardiac Enzymes No results for input(s): TROPONINI in the last 168 hours. RADIOLOGY:  [place] EKG SITE RITE  Result Date: 10/04/2019 If Site Rite image not attached, placement could not be confirmed due to current cardiac rhythm.  ASSESSMENT AND PLAN:  Timothy Juarez is a 70 y.o. male with medical history significant for COPD, diastolic heart failure, hospitalized for Covid Pneumonia in May 2020, history of recurrent DVT on Eliquis, hypertension, with history of peripheral vascular disease with left SFA stent with history of left AKA, who was sent into the emergency room by vascular surgeon Dr. Lucky Cowboy due to concern for abscess at the left AKA stump, probably related to old stent and SFA  Abscess/Hematoma  of left AKA stump, with history of SFA stent left thigh -ID consultation appreciated--Treating as likely Endovascular infection and will need IV vanc and Ceftriaxone for 4 weeks per ID -d/c Right IJ--place PICC line on 2/16 -Deep surgical wound culture 09/29/19--few WBC, rare GNR--Diptheroids -BC negative -Dr.Dew s/p irrigation and debridement of left above knee amputation abscess/hematoma with removal of stent on  09/29/2019 -status post dressing change with wound VAC placement 09/30/2019 and changed on 2/17  Hypokalemia -Replete and recheck, check magnesium  -CT femur on admission left shows heterogeneous collection of the left thigh, measuring 10.4 x 17.1 x 6.2 cm, likely a combination of hematoma and abscess. The collection extends to the medial thigh skin surface -s/p wound vac change x2  in OR -Wound vac changed by Wound nurse today. on 2/17 and then will need change MWF and f/u vascular as outpt  Acute blood loss anemia secondary to post hemorrhage/abscess/hematoma over left AKA stump -came in with hemoglobin of 11.2--10.4--OR--9.3--7.7--BT--8.7--7.7--8.0--8.4--7.6  Post op hypotension suspected due to volume loss with blood loss -off IV Norepinephrine  -now off low dose Iv dopamine (due to bradycardia) -received Iv albumin, midodrine and solucortef to help with intravascular volume and BP--stable  AKI (acute kidney injury) (HCC)--resolved -Suspect prerenal -IV hydration and monitor renal function -creat 1.26--1.1 -good uop    PAD (peripheral artery disease) (HCC) -On aspirin and atorvastatin     (HFpEF) heart failure with preserved ejection fraction (HCC) -EF 60 to 65% in February 2019 -well compensated -holding diuretics at present   COPD with chronic bronchitis ([place]) Not acutely exacerbated Continue home bronchodilator treatments.  DuoNebs as needed    Chronic anticoagulation with eliquis for history of DVT -resume po eliquis from 10/04/2019 per Dr dew   urinary Retention as noted on Bladder scan per RN -Continue po flomax  Foley and Right IJ are removed  DVT prophylaxis: eliquis  Code Status: full code  Family Communication: dter Elmo Putt today Disposition Plan: Back to previous SNF environment--WOM on 2/18 -they did not have appropriate wound VAC available at [facility] today so discharge was canceled for 2/17, if everything is in place for his wound VAC at  facility then he can potentially go tomorrow Consults called: Dr. Lucky Cowboy  Barriers to discharge:none   TOTAL TIME TAKING CARE OF THIS PATIENT: *30* minutes.  >50% time spent on counselling and coordination of care  Note: This dictation was prepared with Dragon dictation along with smaller phrase technology. Any transcriptional errors that result from this process are unintentional.  Max Sane M.D    [facility]   CC: Primary care physician; Alvester Morin, MDPatient ID: Mikel Cella, male   DOB: Dec 26, 1950, 69 y.o.   MRN: [id number]

## 2019-10-05 NOTE — Progress Notes (Signed)
Peripherally Inserted Central Catheter/Midline Placement  The IV Nurse has discussed with the patient and/or persons authorized to consent for the patient, the purpose of this procedure and the potential benefits and risks involved with this procedure.  The benefits include less needle sticks, lab draws from the catheter, and the patient may be discharged home with the catheter. Risks include, but not limited to, infection, bleeding, blood clot (thrombus formation), and puncture of an artery; nerve damage and irregular heartbeat and possibility to perform a PICC exchange if needed/ordered by physician.  Alternatives to this procedure were also discussed.  Bard Power PICC patient education guide, fact sheet on infection prevention and patient information card has been provided to patient /or left at bedside.    PICC/Midline Placement Documentation  PICC Single Lumen 10/05/19 PICC Right Brachial 41 cm 1 cm (Active)  Indication for Insertion or Continuance of Line Prolonged intravenous therapies 10/05/19 1054  Exposed Catheter (cm) 1 cm 10/05/19 1054  Site Assessment Clean;Dry;Intact 10/05/19 1054  Line Status Flushed;Blood return noted 10/05/19 1054  Dressing Type Transparent 10/05/19 1054  Dressing Status Clean;Dry;Intact;Antimicrobial disc in place;Other (Comment) 10/05/19 1054  Dressing Intervention New dressing 10/05/19 5498  Dressing Change Due 10/12/19 10/05/19 1054       Christella Noa Albarece 10/05/2019, 10:56 AM

## 2019-10-05 NOTE — Progress Notes (Addendum)
Pharmacy Antibiotic Note  Timothy Juarez is a 69 y.o. male admitted on 09/27/2019 with abscess/soft tissue infection of left AKA stump. Patient is s/p irrigation and debridement on 2/11 with removal of stent. Patient ordered Zosyn EI 3.375g IV Q8hr started on 2/9. Pharmacy has been consulted for Vancomycin dosing. ID consulted.   Plan: Second set of levels obtained to assess appropriateness of current Vancomycin dosing  Vancomycin 1500 mg IV Q 24 hrs. Goal AUC 400-550.  Levels assessed around 3rd dose of this regimen  Dose given 2/16 @ 1630 Peak 2/16 @ 1900 = 38 (next level early for true trough) Trough 2/17 @ 0918 = 19  Predictive true trough with these levels is a Cmin of 13.4 and AUC of 586, with a Ke of 0.0485 and a t 1/2 of 14.3 hr  Will reduce dose to 1250mg  q24 for a calculated AUC of 486, and a predicted Cmin of 11.2  Will update pt OPAT now  Height: 6' 0.99" (185.4 cm) Weight: 210 lb (95.3 kg) IBW/kg (Calculated) : 79.88  Temp (24hrs), Avg:97.7 F (36.5 C), Min:97.5 F (36.4 C), Max:98.2 F (36.8 C)  Recent Labs  Lab 09/30/19 0418 09/30/19 0418 10/01/19 0345 10/01/19 1707 10/02/19 0140 10/02/19 0345 10/03/19 0510 10/04/19 0340 10/04/19 1900 10/05/19 0505 10/05/19 0918  WBC 16.9*  --  21.9*  --   --  17.8* 16.8* 17.9*  --   --   --   CREATININE 1.16   < > 1.09  --   --  0.97 0.83 0.79  --  0.94  --   VANCOTROUGH  --   --   --   --  24*  --   --   --   --   --   --   VANCOPEAK  --   --   --  34  --   --   --   --  38  --   --   VANCORANDOM  --   --   --   --   --   --   --   --   --   --  19   < > = values in this interval not displayed.    Estimated Creatinine Clearance: 85 mL/min (by C-G formula based on SCr of 0.94 mg/dL).    No Known Allergies  Antimicrobials this admission: Vancomycin 2/9 >>  Zosyn  2/9 >>  Dose adjustments this admission: 2/10 Vancomycin adjusted to 1000mg  IV Q12hr.   Microbiology results: 2/11 Tissue Left Leg: few  diptheroids, no anaerobes  2/11 Abscess Left Leg: no growth < 24 hours  2/9 Abscess Left Leg: few diphtheroids, no anaerobes 2/9 BCx: no growth x 3 days  2/10 MRSA PCR: negative  2/9 COVID PCR: negative 2/9 Influenza A/B: negative   Thank you for allowing pharmacy to be a part of this patient's care.  Lu Duffel, PharmD, BCPS Clinical Pharmacist 10/05/2019 11:40 AM

## 2019-10-05 NOTE — Progress Notes (Signed)
ID Pt doing much better Some diarrhea- was getting colace and also on iron But no pain abdomen No nausea  Patient Vitals for the past 24 hrs:  BP Temp Temp src Pulse Resp SpO2  10/05/19 1636 (!) 106/49 98.1 F (36.7 C) Oral 81 16 97 %  10/05/19 1124 (!) 121/52 (!) 97.5 F (36.4 C) Oral (!) 59 16 99 %  10/04/19 2316 121/64 98.2 F (36.8 C) Oral 69 18 100 %  10/04/19 2100 -- 97.6 F (36.4 C) Oral -- -- --    O/e awake and alert Chest b/l air entry HSs1s Abd soft- no tenderness Left AKA- wound vac  labs  CBC Latest Ref Rng & Units 10/05/2019 10/04/2019 10/04/2019  WBC 4.0 - 10.5 K/uL - - -  Hemoglobin 13.0 - 17.0 g/dL 7.6(L) 7.7(L) 7.5(L)  Hematocrit 39.0 - 52.0 % - - -  Platelets 150 - 400 K/uL - - -    CMP Latest Ref Rng & Units 10/05/2019 10/04/2019 10/03/2019  Glucose 70 - 99 mg/dL 108(H) 149(H) 150(H)  BUN 8 - 23 mg/dL 12 10 9   Creatinine 0.61 - 1.24 mg/dL 0.94 0.79 0.83  Sodium 135 - 145 mmol/L 143 142 139  Potassium 3.5 - 5.1 mmol/L 2.9(L) 3.7 3.7  Chloride 98 - 111 mmol/L 109 109 108  CO2 22 - 32 mmol/L 27 28 24   Calcium 8.9 - 10.3 mg/dL 7.9(L) 8.2(L) 7.9(L)  Total Protein 6.5 - 8.1 g/dL - - -  Total Bilirubin 0.3 - 1.2 mg/dL - - -  Alkaline Phos 38 - 126 U/L - - -  AST 15 - 41 U/L - - -  ALT 0 - 44 U/L - - -     Impression/Recommendation  Left AKA stump infection with huge cavity filled with blood and purulent tissue Pathology showed necrosis.  Cultures have been negative except for diphtheroids. As stents  in SFA were exposed we will have to treat this like an endovascular infection. Patient was on Augmentin as outpatient and hence the cultures may have been negative.  He is currently on vancomycin / ceftriaxone.   The 2 IV antibiotics will be needed for at least 4 weeks.  End date 10/27/19 Discussed with Dr. Lucky Cowboy.  Anemia following surgery.  Has received PRBC.[name]  Leucocytosis slowly declining- could be from the blood loss stress response  Diarrhea- was on  colace/iron No other corroborating findings for cdiff- observe  Discussed the management with care team

## 2019-10-06 DIAGNOSIS — L0291 Cutaneous abscess, unspecified: Secondary | ICD-10-CM | POA: Diagnosis not present

## 2019-10-06 DIAGNOSIS — T8744 Infection of amputation stump, left lower extremity: Secondary | ICD-10-CM | POA: Diagnosis not present

## 2019-10-06 DIAGNOSIS — I5032 Chronic diastolic (congestive) heart failure: Secondary | ICD-10-CM

## 2019-10-06 DIAGNOSIS — N179 Acute kidney failure, unspecified: Secondary | ICD-10-CM | POA: Diagnosis not present

## 2019-10-06 DIAGNOSIS — I5030 Unspecified diastolic (congestive) heart failure: Secondary | ICD-10-CM | POA: Diagnosis not present

## 2019-10-06 DIAGNOSIS — L02416 Cutaneous abscess of left lower limb: Secondary | ICD-10-CM | POA: Diagnosis not present

## 2019-10-06 LAB — BASIC METABOLIC PANEL
Anion gap: 5 (ref 5–15)
BUN: 13 mg/dL (ref 8–23)
CO2: 26 mmol/L (ref 22–32)
Calcium: 7.7 mg/dL — ABNORMAL LOW (ref 8.9–10.3)
Chloride: 113 mmol/L — ABNORMAL HIGH (ref 98–111)
Creatinine, Ser: 0.88 mg/dL (ref 0.61–1.24)
GFR calc Af Amer: >60 mL/min (ref >60)
GFR calc non Af Amer: >60 mL/min (ref >60)
Glucose, Bld: 101 mg/dL — ABNORMAL HIGH (ref 70–99)
Potassium: 3.1 mmol/L — ABNORMAL LOW (ref 3.5–5.1)
Sodium: 144 mmol/L (ref 135–145)

## 2019-10-06 LAB — CBC
HCT: 25.8 % — ABNORMAL LOW (ref 39.0–52.0)
Hemoglobin: 8 g/dL — ABNORMAL LOW (ref 13.0–17.0)
MCH: 25.8 pg — ABNORMAL LOW (ref 26.0–34.0)
MCHC: 31 g/dL (ref 30.0–36.0)
MCV: 83.2 fL (ref 80.0–100.0)
Platelets: 386 10*3/uL (ref 150–400)
RBC: 3.1 MIL/uL — ABNORMAL LOW (ref 4.22–5.81)
RDW: 20.9 % — ABNORMAL HIGH (ref 11.5–15.5)
WBC: 16.8 10*3/uL — ABNORMAL HIGH (ref 4.0–10.5)
nRBC: 0.2 % (ref 0.0–0.2)

## 2019-10-06 LAB — MAGNESIUM: Magnesium: 2.1 mg/dL (ref 1.7–2.4)

## 2019-10-06 LAB — HEMOGLOBIN
Hemoglobin: 7.9 g/dL — ABNORMAL LOW (ref 13.0–17.0)
Hemoglobin: 7.8 g/dL — ABNORMAL LOW (ref 13.0–17.0)

## 2019-10-06 LAB — PHOSPHORUS: Phosphorus: 2.3 mg/dL — ABNORMAL LOW (ref 2.5–4.6)

## 2019-10-06 MED ORDER — POTASSIUM CHLORIDE CRYS ER 20 MEQ PO TBCR
40.0000 meq | EXTENDED_RELEASE_TABLET | ORAL | Status: AC
  Administered 2019-10-06 (×2): 40 meq via ORAL
  Filled 2019-10-06 (×2): qty 2

## 2019-10-06 MED ORDER — POTASSIUM PHOSPHATE MONOBASIC 500 MG PO TABS
1000.0000 mg | ORAL_TABLET | ORAL | Status: AC
  Administered 2019-10-06 (×2): 1000 mg via ORAL
  Filled 2019-10-06 (×2): qty 2

## 2019-10-06 NOTE — Progress Notes (Addendum)
Pharmacy Electrolyte Monitoring Consult:  Pharmacy consulted to assist in monitoring and replacing electrolytes in this 69 y.o. male admitted on 09/27/2019 with with abscess/soft tissue infection of left AKA stump. Patient is s/p irrigation and debridement on 2/11 with removal of stent.  Labs:  Sodium (mmol/L)  Date Value  10/06/2019 144  12/01/2013 136   Potassium (mmol/L)  Date Value  10/06/2019 3.1 (L)  12/01/2013 3.9   Magnesium (mg/dL)  Date Value  10/05/2019 2.2  04/01/2013 2.2   Phosphorus (mg/dL)  Date Value  10/06/2019 2.3 (L)  03/29/2013 3.9   Calcium (mg/dL)  Date Value  10/06/2019 7.7 (L)   Calcium, Total (mg/dL)  Date Value  12/01/2013 9.4   Albumin (g/dL)  Date Value  09/27/2019 2.8 (L)  03/25/2013 1.2 (L)    Assessment/Plan: Goal potassium ~ 4, goal magnesium ~ 2, and goal phosphorus 2.5-3.  Will give KCl 40 meq po x 2 doses  Will give K phos PO supplementation for two doses.   Will follow up potassium and phos with morning labs.  Pharmacy will continue to monitor and adjust per consult.  Lu Duffel, PharmD, BCPS Clinical Pharmacist 10/06/2019 8:13 AM

## 2019-10-06 NOTE — Progress Notes (Signed)
[place] at [place] NAME: Timothy Juarez    MR#:  [id number]  DATE OF BIRTH:  01/17/1951  SUBJECTIVE:  Doing well.  No new complaints.  Waiting for placement REVIEW OF SYSTEMS:   Review of Systems  Constitutional: Negative for chills, fever and weight loss.  HENT: Negative for ear discharge, ear pain and nosebleeds.   Eyes: Negative for blurred vision, pain and discharge.  Respiratory: Negative for sputum production, shortness of breath, wheezing and stridor.   Cardiovascular: Negative for chest pain, palpitations, orthopnea and PND.  Gastrointestinal: Negative for abdominal pain, diarrhea, nausea and vomiting.  Genitourinary: Negative for frequency and urgency.  Musculoskeletal: Negative for back pain and joint pain.  Neurological: Negative for sensory change, speech change, focal weakness and weakness.  Psychiatric/Behavioral: Negative for depression and hallucinations. The patient is not nervous/anxious.    Tolerating Diet:yes Tolerating PT: does not walk at baseline. Does bed to WC transfers DRUG ALLERGIES:  No Known Allergies  VITALS:  Blood pressure (!) 118/52, pulse 72, temperature 98.4 F (36.9 C), temperature source Oral, resp. rate 18, height 6' 0.99" (1.854 m), weight 95.3 kg, SpO2 98 %. PHYSICAL EXAMINATION:   Physical Exam  GENERAL:  69 y.o.-year-old patient lying in the bed with no acute distress. Obese Critically ill EYES: Pupils equal, round, reactive to light and accommodation. No scleral icterus.   HEENT: Head atraumatic, normocephalic. Oropharynx and nasopharynx clear.  NECK:  Supple, no jugular venous distention. No thyroid enlargement, no tenderness LUNGS: Normal breath sounds bilaterally, no wheezing, rales, rhonchi. No use of accessory muscles of respiration.  CARDIOVASCULAR: S1, S2 normal. No murmurs, rubs, or gallops.  ABDOMEN: Soft, nontender, nondistended. Bowel sounds present. No organomegaly or mass.    EXTREMITIES: left amputation stump, oozing blood+ On feb 11th   On feb 12th     NEUROLOGIC: Cranial nerves II through XII are intact. Moves all extremities well PSYCHIATRIC:  patient is alert and oriented x 3.  SKIN: as above  LABORATORY PANEL:  CBC Recent Labs  Lab 10/06/19 0523 10/06/19 0523 10/06/19 0752  WBC 16.8*  --   --   HGB 8.0*   < > 7.9*  HCT 25.8*  --   --   PLT 386  --   --    < > = values in this interval not displayed.    Chemistries  Recent Labs  Lab 10/05/19 0505 10/05/19 2130 10/06/19 0523  NA   < >  --  144  K   < >  --  3.1*  CL   < >  --  113*  CO2   < >  --  26  GLUCOSE   < >  --  101*  BUN   < >  --  13  CREATININE   < >  --  0.88  CALCIUM   < >  --  7.7*  MG  --  2.1  --    < > = values in this interval not displayed.   Cardiac Enzymes No results for input(s): TROPONINI in the last 168 hours. RADIOLOGY:  No results found. ASSESSMENT AND PLAN:  Timothy Juarez is a 69 y.o. male with medical history significant for COPD, diastolic heart failure, hospitalized for Covid Pneumonia in May 2020, history of recurrent DVT on Eliquis, hypertension, with history of peripheral vascular disease with left SFA stent with history of left AKA, who was sent into the emergency room by vascular  surgeon Dr. Lucky Cowboy due to concern for abscess at the left AKA stump, probably related to old stent and SFA  Abscess/Hematoma  of left AKA stump, with history of SFA stent left thigh -ID consultation appreciated--Treating as likely Endovascular infection and will need IV vanc and Ceftriaxone for 4 weeks per ID -d/c Right IJ--place PICC line on 2/16 -Deep surgical wound culture 09/29/19--few WBC, rare GNR--Diptheroids -BC negative -Dr.Dew s/p irrigation and debridement of left above knee amputation abscess/hematoma with removal of stent on 09/29/2019 -status post dressing change with wound VAC placement 09/30/2019 and changed on 2/17  Hypokalemia -Pharmacy managing  electrolytes,.  Potassium 3.1 today  -CT femur on admission left shows heterogeneous collection of the left thigh, measuring 10.4 x 17.1 x 6.2 cm, likely a combination of hematoma and abscess. The collection extends to the medial thigh skin surface -s/p wound vac change x2  in OR -Wound vac changed by Wound nurse today. on 2/17 and then will need change MWF and f/u vascular as outpt  Acute blood loss anemia secondary to post hemorrhage/abscess/hematoma over left AKA stump -came in with hemoglobin of 11.2->7.9, if hemoglobin drops less than 7 will need transfusion.  No active bleeding  Post op hypotension suspected due to volume loss with blood loss -Blood pressure stable  AKI (acute kidney injury) (HCC)--resolved -Suspect prerenal -IV hydration and monitor renal function -creat 1.26--0.88 -good uop    PAD (peripheral artery disease) (HCC) -On aspirin and atorvastatin     (HFpEF) heart failure with preserved ejection fraction (HCC) -EF 60 to 65% in February 2019 -well compensated -holding diuretics at present   COPD with chronic bronchitis ([place]) Not acutely exacerbated Continue home bronchodilator treatments.  DuoNebs as needed    Chronic anticoagulation with eliquis for history of DVT -Continue eliquis    urinary Retention as noted on Bladder scan per RN -Continue po flomax  Foley and Right IJ are removed  DVT prophylaxis: eliquis  Code Status: full code  Family Communication: dter Elmo Putt on 0/17 Disposition Plan: Back to previous SNF environment--WOM on 2/18 -they still do not have appropriate wound VAC available at [facility] today, if everything is in place for his wound VAC at facility then he can potentially go tomorrow Consults called: Dr. Lucky Cowboy  Barriers to discharge:none   TOTAL TIME TAKING CARE OF THIS PATIENT: *30* minutes.  >50% time spent on counselling and coordination of care  Note: This dictation was prepared with Dragon dictation along with  smaller phrase technology. Any transcriptional errors that result from this process are unintentional.  Max Sane M.D    [facility]   CC: Primary care physician; Alvester Morin, MDPatient ID: Timothy Juarez, male   DOB: 11/08/1950, 69 y.o.   MRN: [id number]

## 2019-10-07 DIAGNOSIS — J984 Other disorders of lung: Secondary | ICD-10-CM | POA: Diagnosis not present

## 2019-10-07 DIAGNOSIS — L0291 Cutaneous abscess, unspecified: Secondary | ICD-10-CM | POA: Diagnosis not present

## 2019-10-07 DIAGNOSIS — I5032 Chronic diastolic (congestive) heart failure: Secondary | ICD-10-CM | POA: Diagnosis not present

## 2019-10-07 DIAGNOSIS — T8744 Infection of amputation stump, left lower extremity: Secondary | ICD-10-CM | POA: Diagnosis not present

## 2019-10-07 DIAGNOSIS — L02416 Cutaneous abscess of left lower limb: Secondary | ICD-10-CM | POA: Diagnosis not present

## 2019-10-07 DIAGNOSIS — J449 Chronic obstructive pulmonary disease, unspecified: Secondary | ICD-10-CM | POA: Diagnosis not present

## 2019-10-07 LAB — CBC
HCT: 25.3 % — ABNORMAL LOW (ref 39.0–52.0)
Hemoglobin: 7.6 g/dL — ABNORMAL LOW (ref 13.0–17.0)
MCH: 25.3 pg — ABNORMAL LOW (ref 26.0–34.0)
MCHC: 30 g/dL (ref 30.0–36.0)
MCV: 84.3 fL (ref 80.0–100.0)
Platelets: 358 10*3/uL (ref 150–400)
RBC: 3 MIL/uL — ABNORMAL LOW (ref 4.22–5.81)
RDW: 21.6 % — ABNORMAL HIGH (ref 11.5–15.5)
WBC: 15.7 10*3/uL — ABNORMAL HIGH (ref 4.0–10.5)
nRBC: 0.3 % — ABNORMAL HIGH (ref 0.0–0.2)

## 2019-10-07 LAB — BASIC METABOLIC PANEL
Anion gap: 5 (ref 5–15)
BUN: 13 mg/dL (ref 8–23)
CO2: 24 mmol/L (ref 22–32)
Calcium: 7.6 mg/dL — ABNORMAL LOW (ref 8.9–10.3)
Chloride: 114 mmol/L — ABNORMAL HIGH (ref 98–111)
Creatinine, Ser: 0.84 mg/dL (ref 0.61–1.24)
GFR calc Af Amer: >60 mL/min (ref >60)
GFR calc non Af Amer: >60 mL/min (ref >60)
Glucose, Bld: 96 mg/dL (ref 70–99)
Potassium: 3.6 mmol/L (ref 3.5–5.1)
Sodium: 143 mmol/L (ref 135–145)

## 2019-10-07 LAB — PHOSPHORUS: Phosphorus: 2.7 mg/dL (ref 2.5–4.6)

## 2019-10-07 LAB — RESPIRATORY PANEL BY RT PCR (FLU A&B, COVID)
Influenza A by PCR: NEGATIVE
Influenza B by PCR: NEGATIVE
SARS Coronavirus 2 by RT PCR: NEGATIVE

## 2019-10-07 LAB — HEMOGLOBIN: Hemoglobin: 7.9 g/dL — ABNORMAL LOW (ref 13.0–17.0)

## 2019-10-07 MED ORDER — FENTANYL 12 MCG/HR TD PT72: 1.0000 | MEDICATED_PATCH | TRANSDERMAL | 0 refills | Status: DC

## 2019-10-07 MED ORDER — POTASSIUM CHLORIDE CRYS ER 20 MEQ PO TBCR
40.0000 meq | EXTENDED_RELEASE_TABLET | Freq: Once | ORAL | Status: AC
  Administered 2019-10-07: 40 meq via ORAL
  Filled 2019-10-07: qty 2

## 2019-10-07 NOTE — Plan of Care (Signed)

## 2019-10-07 NOTE — TOC Progression Note (Signed)
Transition of Care [facility]) - Progression Note    Patient Details  Name: Timothy Juarez MRN: [id number] Date of Birth: 09-Feb-1951  Transition of Care [facility]) CM/SW Contact  Clotilda Hafer, Gardiner Rhyme, LCSW Phone Number: 10/07/2019, 10:58 AM  Clinical Narrative:  Spoke with WOC-RN at [facility] regarding taking wound vac off when trasnferred and he will evaluate wounds and replace with there's. Have informed [facility] RN know and she will do this prior to his transfer to [facility]. Awaiting paperwork to be complete for pt to return to facility          Expected Discharge Plan and Services           Expected Discharge Date: 10/07/19                                     Social Determinants of Health (SDOH) Interventions    Readmission Risk Interventions No flowsheet data found.

## 2019-10-07 NOTE — Plan of Care (Signed)
  Problem: Health Behavior/Discharge Planning: Goal: Ability to manage health-related needs will improve 10/07/2019 1227 by Blenda Bridegroom, RN Outcome: Completed/Met 10/07/2019 0804 by Blenda Bridegroom, RN Outcome: Progressing   Problem: Clinical Measurements: Goal: Ability to maintain clinical measurements within normal limits will improve 10/07/2019 1227 by Blenda Bridegroom, RN Outcome: Completed/Met 10/07/2019 0804 by Blenda Bridegroom, RN Outcome: Progressing Goal: Will remain free from infection 10/07/2019 1227 by Blenda Bridegroom, RN Outcome: Completed/Met 10/07/2019 0804 by Blenda Bridegroom, RN Outcome: Progressing Goal: Diagnostic test results will improve 10/07/2019 1227 by Blenda Bridegroom, RN Outcome: Completed/Met 10/07/2019 0804 by Blenda Bridegroom, RN Outcome: Progressing Goal: Cardiovascular complication will be avoided 10/07/2019 1227 by Blenda Bridegroom, RN Outcome: Completed/Met 10/07/2019 0804 by Blenda Bridegroom, RN Outcome: Progressing   Problem: Activity: Goal: Risk for activity intolerance will decrease 10/07/2019 1227 by Blenda Bridegroom, RN Outcome: Completed/Met 10/07/2019 0804 by Blenda Bridegroom, RN Outcome: Progressing   Problem: Nutrition: Goal: Adequate nutrition will be maintained 10/07/2019 1227 by Blenda Bridegroom, RN Outcome: Completed/Met 10/07/2019 0804 by Blenda Bridegroom, RN Outcome: Progressing   Problem: Coping: Goal: Level of anxiety will decrease 10/07/2019 1227 by Blenda Bridegroom, RN Outcome: Completed/Met 10/07/2019 0804 by Blenda Bridegroom, RN Outcome: Progressing   Problem: Elimination: Goal: Will not experience complications related to bowel motility 10/07/2019 1227 by Blenda Bridegroom, RN Outcome: Completed/Met 10/07/2019 0804 by Blenda Bridegroom, RN Outcome: Progressing Goal: Will not experience complications related to urinary retention 10/07/2019 1227 by Blenda Bridegroom, RN Outcome: Completed/Met 10/07/2019 0804 by Blenda Bridegroom, RN Outcome: Progressing    Problem: Pain Managment: Goal: General experience of comfort will improve 10/07/2019 1227 by Blenda Bridegroom, RN Outcome: Completed/Met 10/07/2019 0804 by Blenda Bridegroom, RN Outcome: Progressing   Problem: Safety: Goal: Ability to remain free from injury will improve 10/07/2019 1227 by Blenda Bridegroom, RN Outcome: Completed/Met 10/07/2019 0804 by Blenda Bridegroom, RN Outcome: Progressing   Problem: Skin Integrity: Goal: Risk for impaired skin integrity will decrease 10/07/2019 1227 by Blenda Bridegroom, RN Outcome: Completed/Met 10/07/2019 0804 by Blenda Bridegroom, RN Outcome: Progressing

## 2019-10-07 NOTE — TOC Progression Note (Signed)
Transition of Care [facility]) - Progression Note    Patient Details  Name: Timothy Juarez MRN: [id number] Date of Birth: [birth year]  Transition of Care [facility]) CM/SW Contact  Coren Crownover, Gardiner Rhyme, LCSW Phone Number: 10/07/2019, 9:21 AM  Clinical Narrative:    Spoke with [facility] who has received the wound vac, so can take pt back today. Have let MD know and will need rapid COVID test prior to transferring back to facility. Will make family aware and work on transfer today. Bedside RN aware and will need to call report to [phone]         Expected Discharge Plan and Services           Expected Discharge Date: 10/05/19                                     Social Determinants of Health (SDOH) Interventions    Readmission Risk Interventions No flowsheet data found.

## 2019-10-07 NOTE — Discharge Summary (Signed)
[place] [facility] [place] NAME: Timothy Juarez    MR#:  [id number]  DATE OF BIRTH:  01-Nov-1950  DATE OF ADMISSION:  09/27/2019   ADMITTING PHYSICIAN: Fritzi Mandes, MD  DATE OF DISCHARGE: 10/07/2019  PRIMARY CARE PHYSICIAN: Alvester Morin, MD   ADMISSION DIAGNOSIS:  Abscess [L02.91] Abscess of left leg [L02.416] Hypotension [I95.9] DISCHARGE DIAGNOSIS:  Principal Problem:   Abscess of left AKA stump Active Problems:   PAD (peripheral artery disease) ([place])   Essential hypertension   AKI (acute kidney injury) ([place])   Infection of above knee amputation stump ([place])   History of COVID-19   (HFpEF) heart failure with preserved ejection fraction (HCC)   COPD with chronic bronchitis (HCC)   Chronic anticoagulation with eliquis   Preoperative clearance   Hypotension   Pulmonary disease  SECONDARY DIAGNOSIS:   Past Medical History:  Diagnosis Date  . Acute embolism and thombos unsp deep vn unsp lower extremity ([place])   . Acute respiratory failure ([place])   . Allergy   . Anemia   . Aneurysm of unspecified site ([place])   . ARF (acute respiratory failure) ([place])    H/O  . Bronchitis   . CHF (congestive heart failure) ([place])   . COPD (chronic obstructive pulmonary disease) ([place])   . Cough   . Epistaxis   . GERD (gastroesophageal reflux disease)   . Gout   . Hyperlipidemia   . Hypertension   . Hypokalemia   . Insomnia   . Muscle contracture    MUSCLE SPASMS, muscle weakness  . Peripheral vascular disease ([place])   . Pneumonia   . Pressure ulcer    HOSPITAL COURSE:  Timothy Juarez a 69 y.o.malewith medical history significant forCOPD, diastolic heart failure, hospitalized for CovidPneumoniain May 2020, history of recurrent DVT on Eliquis, hypertension, with history of peripheral vascular disease with left SFA stent with history of left AKA, who was sent into the emergency room by vascular surgeon Dr. Lucky Cowboy due to concern for abscess at the left  AKA stump, probably related to old stent and SFA  Abscess/Hematoma of left AKA stump,with history of SFA stent left thigh Left AKA stump infection with huge cavity filled with blood and purulent tissue Pathology showed necrosis. Cultures have been negative except for diphtheroids. Patient was on Augmentin as outpatient and hence the cultures may have been negative.  -He is treated with vancomycin & ceftriaxone while in the hospital.  -As stentsin SFA were exposed, Treating as likely Endovascular infection and will need IV vanc and Ceftriaxone for 4 weeks per ID. End date 10/27/19 -Dr.Dew s/p irrigation and debridement of left above knee amputation abscess/hematoma with removal of stent on 09/29/2019 -status post dressing change with wound VAC placement 09/30/2019 and changed on 2/17  Hypokalemia -Repleted  -CT femur on admission left shows heterogeneous collection of the left thigh, measuring 10.4 x 17.1 x 6.2 cm, likely a combination of hematoma and abscess. The collection extends to the medial thigh skin surface -s/p wound vac change x2  in OR -Wound vac changed by Wound nurse while in the hospital. on 2/17 and then will need change MWF and f/u vascular as outpt  Acute blood loss anemia secondary to post hemorrhage/abscess/hematoma over left AKA stump -came in with hemoglobin of 11.2->7.9, No active bleeding.  Hemodynamically stable  Post op hypotension suspected due to volume loss with blood loss -Blood pressure stable  AKI (acute kidney injury) (HCC)--resolved - prerenal - Improved with hydration and  back to baseline  PAD (peripheral artery disease) (HCC) -On aspirin and atorvastatin   (HFpEF) heart failure with preserved ejection fraction (HCC) -EF 60 to 65% in February 2019 -well compensated  COPD with chronic bronchitis ([place]) Stable on home medicine  Chronic anticoagulation with eliquisfor history of DVT  urinary Retention - Continue po  flomax DISCHARGE CONDITIONS:  stable CONSULTS OBTAINED:  Treatment Team:  Tsosie Billing, MD DRUG ALLERGIES:  No Known Allergies DISCHARGE MEDICATIONS:   Allergies as of 10/07/2019   No Known Allergies     Medication List    STOP taking these medications   amoxicillin-clavulanate 500-125 MG tablet Commonly known as: AUGMENTIN   Oxycodone HCl 10 MG Tabs     TAKE these medications   acetaminophen 325 MG tablet Commonly known as: TYLENOL Take 650 mg by mouth every 4 (four) hours as needed for fever.   acidophilus Caps capsule Take 1 capsule by mouth 2 (two) times daily.   ammonium lactate 12 % cream Commonly known as: AMLACTIN Apply topically 2 (two) times daily. Apply to right foot   aspirin EC 81 MG tablet Take 1 tablet (81 mg total) by mouth daily.   atorvastatin 10 MG tablet Commonly known as: Lipitor Take 1 tablet (10 mg total) by mouth daily. What changed: when to take this   baclofen 10 MG tablet Commonly known as: LIORESAL Take 10 mg by mouth 3 (three) times daily. Hold for sedation   cefTRIAXone  IVPB Commonly known as: ROCEPHIN Inject 2 g into the vein daily for 23 days. Indication:  Left AKA site infection along with endovascular infection Last Day of Therapy:  10/27/2019 [facility] Care Per Protocol:including placement of biopatch  Labs Monday and Thursday weekly while on IV antibiotics: __X__ BMP __X_ Vancomycin trough  Labs every Monday CBC with diff/platelet  _X_ Please pull PIC at completion of IV antibiotics  Fax weekly labs to Dr.Ravishankar  [phone]  Call [phone] to make virtual appt   ciclopirox 8 % solution Commonly known as: PENLAC Apply 1 application topically daily. Apply solutions to toenails   Combigan 0.2-0.5 % ophthalmic solution Generic drug: brimonidine-timolol Place 1 drop into the right eye 2 (two) times daily.   docusate sodium 100 MG capsule Commonly known as: COLACE Take 100 mg by mouth daily  as needed for mild constipation.   Durezol 0.05 % Emul Generic drug: Difluprednate Place 1 drop into the right eye 3 (three) times daily.   Eliquis 5 MG Tabs tablet Generic drug: apixaban Take 5 mg by mouth 2 (two) times daily.   fentaNYL 12 MCG/HR Commonly known as: [employer] 1 patch onto the skin every 3 (three) days.   ferrous sulfate 324 (65 Fe) MG Tbec Take 324 mg by mouth 2 (two) times daily.   Fish Oil 1000 MG Caps Take 1,000 mg by mouth daily.   furosemide 40 MG tablet Commonly known as: LASIX Take 1 tablet (40 mg total) by mouth daily.   gabapentin 100 MG capsule Commonly known as: NEURONTIN Take 100 mg by mouth at bedtime.   guaiFENesin 100 MG/5ML Soln Commonly known as: ROBITUSSIN Take 15 mLs by mouth 3 (three) times daily as needed for cough (congestion).   HYDROcodone-acetaminophen 5-325 MG tablet Commonly known as: NORCO/VICODIN Take 1-2 tablets by mouth every 8 (eight) hours as needed for up to 3 doses for moderate pain or severe pain.   loperamide 2 MG capsule Commonly known as: IMODIUM Take 2 mg by mouth 3 (three) times  daily as needed for diarrhea or loose stools.   Melatonin 5 MG Tabs Take 5 mg by mouth at bedtime as needed (insomnia).   nitroGLYCERIN 0.4 MG SL tablet Commonly known as: NITROSTAT Place 0.14 mg under the tongue every 5 (five) minutes as needed for chest pain.   oxymetazoline 0.05 % nasal spray Commonly known as: AFRIN Place 2 sprays into both nostrils 2 (two) times daily as needed (For nose bleeds).   potassium chloride SA 20 MEQ tablet Commonly known as: KLOR-CON Take 20 mEq by mouth daily. With or after meal   sodium chloride 0.65 % Soln nasal spray Commonly known as: OCEAN Place 2 sprays into both nostrils 2 (two) times daily as needed for congestion.   tamsulosin 0.4 MG Caps capsule Commonly known as: FLOMAX Take 1 capsule (0.4 mg total) by mouth daily.   Trelegy Ellipta 100-62.5-25 MCG/INH Aepb Generic drug:  Fluticasone-Umeclidin-Vilant Inhale 1 puff into the lungs every morning.   vancomycin  IVPB Inject 1,250 mg into the vein daily for 22 days. Indication:  Left AKA Site infection along with endovascular infection Last Day of Therapy: 10/27/2019 Labs - Sunday/Monday:  CBC/D, BMP, and vancomycin trough. Labs - Thursday:  BMP and vancomycin trough Labs - Every other week:  ESR and CRP            Home Infusion Instuctions  (From admission, onward)         Start     Ordered   10/05/19 0000  Home infusion instructions    Question:  Instructions  Answer:  Flushing of vascular access device: 0.9% NaCl pre/post medication administration and prn patency; Heparin 100 u/ml, 21m for implanted ports and Heparin 10u/ml, 540mfor all other central venous catheters.   10/05/19 1154   10/05/19 0000  Home infusion instructions    Question:  Instructions  Answer:  Flushing of vascular access device: 0.9% NaCl pre/post medication administration and prn patency; Heparin 100 u/ml, 52m84mor implanted ports and Heparin 10u/ml, 52ml59mr all other central venous catheters.   10/05/19 1206         DISCHARGE INSTRUCTIONS:   DIET:  Cardiac diet DISCHARGE CONDITION:  Stable ACTIVITY:  Activity as tolerated OXYGEN:  Home Oxygen: No.  Oxygen Delivery: room air DISCHARGE LOCATION:  nursing home   If you experience worsening of your admission symptoms, develop shortness of breath, life threatening emergency, suicidal or homicidal thoughts you must seek medical attention immediately by calling 911 or calling your MD immediately  if symptoms less severe.  You Must read complete instructions/literature along with all the possible adverse reactions/side effects for all the Medicines you take and that have been prescribed to you. Take any new Medicines after you have completely understood and accpet all the possible adverse reactions/side effects.   Please note  You were cared for by a hospitalist during your  hospital stay. If you have any questions about your discharge medications or the care you received while you were in the hospital after you are discharged, you can call the unit and asked to speak with the hospitalist on call if the hospitalist that took care of you is not available. Once you are discharged, your primary care physician will handle any further medical issues. Please note that NO REFILLS for any discharge medications will be authorized once you are discharged, as it is imperative that you return to your primary care physician (or establish a relationship with a primary care physician if you do not have  one) for your aftercare needs so that they can reassess your need for medications and monitor your lab values.    On the day of Discharge:  VITAL SIGNS:  Blood pressure (!) 122/59, pulse 80, temperature 98.3 F (36.8 C), temperature source Oral, resp. rate 18, height 6' 0.99" (1.854 m), weight 95.3 kg, SpO2 99 %. PHYSICAL EXAMINATION:  GENERAL:  69 y.o.-year-old patient lying in the bed with no acute distress. Obese EYES: Pupils equal, round, reactive to light and accommodation. No scleral icterus.   HEENT: Head atraumatic, normocephalic. Oropharynx and nasopharynx clear.  NECK:  Supple, no jugular venous distention. No thyroid enlargement, no tenderness LUNGS: Normal breath sounds bilaterally, no wheezing, rales, rhonchi. No use of accessory muscles of respiration.  CARDIOVASCULAR: S1, S2 normal. No murmurs, rubs, or gallops.  ABDOMEN: Soft, nontender, nondistended. Bowel sounds present. No organomegaly or mass.  EXTREMITIES: left amputation stump, oozing blood+ On feb 11th   On feb 12th     NEUROLOGIC: Cranial nerves II through XII are intact. Moves all extremities well PSYCHIATRIC:  patient is alert and oriented x 3.  SKIN: as above  DATA REVIEW:   CBC Recent Labs  Lab 10/07/19 0610 10/07/19 0610 10/07/19 0807  WBC 15.7*  --   --   HGB 7.6*   < > 7.9*  HCT  25.3*  --   --   PLT 358  --   --    < > = values in this interval not displayed.    Chemistries  Recent Labs  Lab 10/05/19 2130 10/06/19 0523 10/07/19 0610  NA  --    < > 143  K  --    < > 3.6  CL  --    < > 114*  CO2  --    < > 24  GLUCOSE  --    < > 96  BUN  --    < > 13  CREATININE  --    < > 0.84  CALCIUM  --    < > 7.6*  MG 2.1  --   --    < > = values in this interval not displayed.       Contact information for follow-up providers    Kris Hartmann, NP Follow up in 2 week(s).   Specialty: Vascular Surgery Why: Has VAC. First post-op wound check. No studies needed.  Contact information: [place] [address] [phone]        Tsosie Billing, MD. Schedule an appointment as soon as possible for a visit in 2 week(s).   Specialty: Infectious Diseases Contact information: [place] [address] [phone]            Contact information for after-discharge care    Destination    [facility] .   Service: Skilled Nursing Contact information: [address] [place] [phone]                   Management plans discussed with the patient, nursing and they are in agreement.  CODE STATUS: Full Code   TOTAL TIME TAKING CARE OF THIS PATIENT: 45 minutes.    Max Sane M.D on 10/07/2019 at 11:39 AM  [facility]   CC: Primary care physician; Alvester Morin, MD   Note: This dictation was prepared with Dragon dictation along with smaller phrase technology. Any transcriptional errors that result from this process are unintentional.

## 2019-10-07 NOTE — Progress Notes (Signed)
The nurse attempted to call report to facility nurse.  No answer at facility.  Will try again

## 2019-10-07 NOTE — TOC Transition Note (Signed)
Transition of Care [facility]) - CM/SW Discharge Note   Patient Details  Name: Timothy Juarez MRN: [id number] Date of Birth: [birth year]  Transition of [facility]) CM/SW Contact:  Elease Hashimoto, LCSW Phone Number: 10/07/2019, 11:42 AM   Clinical Narrative:  MD feels pt medically ready to return to [facility] he is a long term resident there. COVID test negative. Waiting for MD to do paperwork . Will let family member know and bedside RN to call report [phone]. Wound vac to be DC and then place back on when gets to facility. WOC-RN at facility aware coming and wound vac at facility. Call EMS once DC summary completed  12:17 paperwork complete and ready to return to [facility] RN aware. Message left for daughter.    Final next level of care: Long Term Nursing Home Barriers to Discharge: Barriers Resolved   Patient Goals and CMS Choice        Discharge Placement   Existing PASRR number confirmed : 10/05/19          [facility] Patient to be transferred to facility by: EMS Name of family member notified: Alicia-daughter Patient and family notified of of transfer: 10/07/19  Discharge Plan and Services                                     Social Determinants of Health (SDOH) Interventions     Readmission Risk Interventions No flowsheet data found.

## 2019-10-07 NOTE — Progress Notes (Addendum)
Pharmacy Antibiotic Note  Timothy Juarez is a 69 y.o. male admitted on 09/27/2019 with abscess/soft tissue infection of left AKA stump. Patient is s/p irrigation and debridement on 2/11 with removal of stent. Patient ordered Zosyn EI 3.375g IV Q8hr started on 2/9. Pharmacy has been consulted for Vancomycin dosing. ID consulted.   Plan:  Will continue Vancomycin 1250mg  q24 for a calculated AUC of 486, and a predicted Cmin of 11.2 (based on levels drawn 2/16)  Dose given 2/16 @ 1630 Peak 2/16 @ 1900 = 38 (next level early for true trough) Trough 2/17 @ 0918 = 19  Plan to continue Vancomycin/Ceftriaxone until 10/27/19  Height: 6' 0.99" (185.4 cm) Weight: 210 lb (95.3 kg) IBW/kg (Calculated) : 79.88  Temp (24hrs), Avg:98.2 F (36.8 C), Min:98 F (36.7 C), Max:98.3 F (36.8 C)  Recent Labs  Lab 10/01/19 0345 10/01/19 1707 10/02/19 0140 10/02/19 0345 10/02/19 0345 10/03/19 0510 10/04/19 0340 10/04/19 1900 10/05/19 0505 10/05/19 0918 10/06/19 0523 10/07/19 0610  WBC   < >  --   --  17.8*  --  16.8* 17.9*  --   --   --  16.8* 15.7*  CREATININE   < >  --   --  0.97   < > 0.83 0.79  --  0.94  --  0.88 0.84  VANCOTROUGH  --   --  24*  --   --   --   --   --   --   --   --   --   VANCOPEAK  --  34  --   --   --   --   --  38  --   --   --   --   VANCORANDOM  --   --   --   --   --   --   --   --   --  19  --   --    < > = values in this interval not displayed.    Estimated Creatinine Clearance: 95.1 mL/min (by C-G formula based on SCr of 0.84 mg/dL).    No Known Allergies  Antimicrobials this admission: Vancomycin 2/9 >>  Zosyn  2/9 >> 2/16 Ceftriaxone 2/16 >>  Dose adjustments this admission: 2/10 Vancomycin adjusted to 1000mg  IV Q12hr.  2/14 Vancomycin adjusted to 1500mg  IV q24hr 2/17 Vancomycin adjusted to 1250mg  IV q24h  Microbiology results: 2/11 Tissue Left Leg: few diptheroids, no anaerobes  2/11 Abscess Left Leg: no growth < 24 hours  2/9 Abscess Left Leg:  few diphtheroids, no anaerobes 2/9 BCx: no growth x 3 days  2/10 MRSA PCR: negative  2/9 COVID PCR: negative 2/9 Influenza A/B: negative   Thank you for allowing pharmacy to be a part of this patient's care.  Lu Duffel, PharmD, BCPS Clinical Pharmacist 10/07/2019 7:43 AM

## 2019-10-07 NOTE — Progress Notes (Signed)
The nurse attempted to call report to facility.  No answer by nurse.  Will continue to attempt to call

## 2019-10-07 NOTE — Progress Notes (Signed)
Pharmacy Electrolyte Monitoring Consult:  Pharmacy consulted to assist in monitoring and replacing electrolytes in this 69 y.o. male admitted on 09/27/2019 with with abscess/soft tissue infection of left AKA stump. Patient is s/p irrigation and debridement on 2/11 with removal of stent.  Labs:  Sodium (mmol/L)  Date Value  10/07/2019 143  12/01/2013 136   Potassium (mmol/L)  Date Value  10/07/2019 3.6  12/01/2013 3.9   Magnesium (mg/dL)  Date Value  10/05/2019 2.1  04/01/2013 2.2   Phosphorus (mg/dL)  Date Value  10/07/2019 2.7  03/29/2013 3.9   Calcium (mg/dL)  Date Value  10/07/2019 7.6 (L)   Calcium, Total (mg/dL)  Date Value  12/01/2013 9.4   Albumin (g/dL)  Date Value  09/27/2019 2.8 (L)  03/25/2013 1.2 (L)    Assessment/Plan: Goal potassium ~ 4, goal magnesium ~ 2, and goal phosphorus 2.5-3.  Will give KCl 40 meq po x 1 doses  Phosphorous at goal - no replenishment warranted.   Will follow up bmp with morning labs and phos ever 3 days.  Pharmacy will continue to monitor and adjust per consult.  Lu Duffel, PharmD, BCPS Clinical Pharmacist 10/07/2019 7:53 AM

## 2019-10-07 NOTE — Progress Notes (Signed)
The patient refused head-to-toe assessment by RN

## 2019-10-19 ENCOUNTER — Ambulatory Visit (INDEPENDENT_AMBULATORY_CARE_PROVIDER_SITE_OTHER): Payer: Medicare Other | Admitting: Nurse Practitioner

## 2019-10-19 ENCOUNTER — Other Ambulatory Visit: Payer: Self-pay

## 2019-10-19 ENCOUNTER — Encounter (INDEPENDENT_AMBULATORY_CARE_PROVIDER_SITE_OTHER): Payer: Self-pay | Admitting: Nurse Practitioner

## 2019-10-19 VITALS — BP 109/66 | HR 98 | Resp 16

## 2019-10-19 DIAGNOSIS — J4489 Other specified chronic obstructive pulmonary disease: Secondary | ICD-10-CM

## 2019-10-19 DIAGNOSIS — L02416 Cutaneous abscess of left lower limb: Secondary | ICD-10-CM

## 2019-10-19 DIAGNOSIS — J449 Chronic obstructive pulmonary disease, unspecified: Secondary | ICD-10-CM | POA: Diagnosis not present

## 2019-10-19 DIAGNOSIS — I10 Essential (primary) hypertension: Secondary | ICD-10-CM | POA: Diagnosis not present

## 2019-10-21 ENCOUNTER — Encounter (INDEPENDENT_AMBULATORY_CARE_PROVIDER_SITE_OTHER): Payer: Self-pay | Admitting: Nurse Practitioner

## 2019-10-21 NOTE — Progress Notes (Signed)
SUBJECTIVE:  Patient ID: Timothy Juarez, male    DOB: 28-Feb-1951, 69 y.o.   MRN: [id number] Chief Complaint  Patient presents with  . Follow-up    [facility] 3week wound check    HPI  Timothy Juarez is a 69 y.o. male that presents today after recent incision and drainage of an abscess in the patient's left above-knee amputation.  Overall, today the patient states that he does not have much pain and that the pain he does have is well controlled.  The pain medication at his facility.  The patient has been tolerating the wound VAC well.  He denies any fever, chills, nausea, vomiting or diarrhea.  He denies any chest pain or shortness of breath.  He denies any signs symptoms of ischemia.  Patient states that he has been draining fairly frequently and has large amounts of drainage still.  Patient states that the wound VAC is being changed 3 times per day.  Past Medical History:  Diagnosis Date  . Acute embolism and thombos unsp deep vn unsp lower extremity ([place])   . Acute respiratory failure ([place])   . Allergy   . Anemia   . Aneurysm of unspecified site ([place])   . ARF (acute respiratory failure) ([place])    H/O  . Bronchitis   . CHF (congestive heart failure) ([place])   . COPD (chronic obstructive pulmonary disease) ([place])   . Cough   . Epistaxis   . GERD (gastroesophageal reflux disease)   . Gout   . Hyperlipidemia   . Hypertension   . Hypokalemia   . Insomnia   . Muscle contracture    MUSCLE SPASMS, muscle weakness  . Peripheral vascular disease ([place])   . Pneumonia   . Pressure ulcer     Past Surgical History:  Procedure Laterality Date  . AMPUTATION Left 09/19/2015   Procedure: AMPUTATION BELOW KNEE;  Surgeon: Algernon Huxley, MD;  Location: [facility] ORS;  Service: Vascular;  Laterality: Left;  . AMPUTATION Left 11/01/2015   Procedure: AMPUTATION ABOVE KNEE;  Surgeon: Algernon Huxley, MD;  Location: [facility] ORS;  Service: Vascular;  Laterality: Left;  . AMPUTATION Left 09/29/2019   Procedure:  IRRIGATION AND DEBRIDEMENT OF LEFT AKA;  Surgeon: Algernon Huxley, MD;  Location: [facility] ORS;  Service: General;  Laterality: Left;  . APPLICATION OF WOUND VAC Left 10/18/2015   Procedure: APPLICATION OF WOUND VAC;  Surgeon: Algernon Huxley, MD;  Location: [facility] OR[facility];  Service: Vascular;  Laterality: Left;  . APPLICATION OF WOUND VAC Left 09/30/2019   Procedure: APPLICATION OF WOUND VAC;  Surgeon: Katha Cabal, MD;  Location: [facility];  Service: Vascular;  Laterality: Left;  serial # [id number]  . CATARACT EXTRACTION W/PHACO Right 08/18/2019   Procedure: CATARACT EXTRACTION PHACO AND INTRAOCULAR LENS PLACEMENT ([place]) RIGHT;  Surgeon: Birder Robson, MD;  Location: [facility];  Service: Ophthalmology;  Laterality: Right;  [place] 03:29.0 CDE 45.68 Fluid Pack Lot # [id number]  . CENTRAL VENOUS CATHETER INSERTION Right 09/30/2019   Procedure: INSERTION CENTRAL LINE ADULT;  Surgeon: Katha Cabal, MD;  Location: [facility];  Service: Vascular;  Laterality: Right;  . COLONOSCOPY WITH PROPOFOL N/A 05/25/2017   Procedure: COLONOSCOPY WITH PROPOFOL;  Surgeon: Jonathon Bellows, MD;  Location: [facility] ENDOSCOPY;  Service: Gastroenterology;  Laterality: N/A;  . COLONOSCOPY WITH PROPOFOL N/A 10/14/2017   Procedure: COLONOSCOPY WITH PROPOFOL;  Surgeon: Jonathon Bellows, MD;  Location: [facility] ENDOSCOPY;  Service: Gastroenterology;  Laterality: N/A;  . ESOPHAGOGASTRODUODENOSCOPY (EGD) WITH  PROPOFOL N/A 05/25/2017   Procedure: ESOPHAGOGASTRODUODENOSCOPY (EGD) WITH PROPOFOL;  Surgeon: Jonathon Bellows, MD;  Location: [facility] ENDOSCOPY;  Service: Gastroenterology;  Laterality: N/A;  . ESOPHAGOGASTRODUODENOSCOPY (EGD) WITH PROPOFOL N/A 10/14/2017   Procedure: ESOPHAGOGASTRODUODENOSCOPY (EGD) WITH PROPOFOL;  Surgeon: Jonathon Bellows, MD;  Location: [facility] ENDOSCOPY;  Service: Gastroenterology;  Laterality: N/A;  . EYE SURGERY    . GIVENS CAPSULE STUDY N/A 07/14/2017   Procedure: GIVENS CAPSULE STUDY;  Surgeon: Jonathon Bellows, MD;  Location: [facility] ENDOSCOPY;   Service: Gastroenterology;  Laterality: N/A;  . GIVENS CAPSULE STUDY N/A 10/30/2017   Procedure: GIVENS CAPSULE STUDY 12 HR;  Surgeon: Jonathon Bellows, MD;  Location: [facility] ENDOSCOPY;  Service: Gastroenterology;  Laterality: N/A;  . IVC FILTER INSERTION N/A 10/12/2017   Procedure: IVC FILTER INSERTION;  Surgeon: Algernon Huxley, MD;  Location: [place] CV LAB;  Service: Cardiovascular;  Laterality: N/A;  . LOWER EXTREMITY ANGIOGRAPHY Right 10/04/2018   Procedure: LOWER EXTREMITY ANGIOGRAPHY;  Surgeon: Algernon Huxley, MD;  Location: [place] CV LAB;  Service: Cardiovascular;  Laterality: Right;  . PERIPHERAL VASCULAR CATHETERIZATION Left 01/18/2015   Procedure: Lower Extremity Angiography;  Surgeon: Algernon Huxley, MD;  Location: [place] CV LAB;  Service: Cardiovascular;  Laterality: Left;  . PERIPHERAL VASCULAR CATHETERIZATION N/A 01/18/2015   Procedure: Lower Extremity Intervention;  Surgeon: Algernon Huxley, MD;  Location: [place] CV LAB;  Service: Cardiovascular;  Laterality: N/A;  . PERIPHERAL VASCULAR CATHETERIZATION  07/30/2015   Procedure: Lower Extremity Intervention;  Surgeon: Algernon Huxley, MD;  Location: [place] CV LAB;  Service: Cardiovascular;;  . PERIPHERAL VASCULAR CATHETERIZATION N/A 07/30/2015   Procedure: Abdominal Aortogram w/Lower Extremity;  Surgeon: Algernon Huxley, MD;  Location: [place] CV LAB;  Service: Cardiovascular;  Laterality: N/A;  . PERIPHERAL VASCULAR CATHETERIZATION Left 08/22/2015   Procedure: Lower Extremity Angiography;  Surgeon: Algernon Huxley, MD;  Location: [place] CV LAB;  Service: Cardiovascular;  Laterality: Left;  . PERIPHERAL VASCULAR CATHETERIZATION Left 08/22/2015   Procedure: Lower Extremity Intervention;  Surgeon: Algernon Huxley, MD;  Location: [place] CV LAB;  Service: Cardiovascular;  Laterality: Left;  . PERIPHERAL VASCULAR CATHETERIZATION Right 03/31/2016   Procedure: Lower Extremity Angiography;  Surgeon: Algernon Huxley, MD;  Location: [place] CV LAB;  Service: Cardiovascular;  Laterality: Right;  . PERIPHERAL VASCULAR CATHETERIZATION  03/31/2016   Procedure: Lower Extremity Intervention;  Surgeon: Algernon Huxley, MD;  Location: [place] CV LAB;  Service: Cardiovascular;;  . PERIPHERAL VASCULAR CATHETERIZATION Left 04/10/2016   Procedure: Lower Extremity Angiography;  Surgeon: Algernon Huxley, MD;  Location: [place] CV LAB;  Service: Cardiovascular;  Laterality: Left;  [name] VACUUM ASSISTED CLOSURE CHANGE Left 10/03/2019   Procedure: LEFT THIGH VACUUM ASSISTED CLOSURE CHANGE;  Surgeon: Algernon Huxley, MD;  Location: [facility] ORS;  Service: General;  Laterality: Left;  . WOUND DEBRIDEMENT Left 10/18/2015   Procedure: DEBRIDEMENT WOUND   ( LEFT BKA DEBRIDEMENT );  Surgeon: Algernon Huxley, MD;  Location: [facility] ORS;  Service: Vascular;  Laterality: Left;    Social History   Socioeconomic History  . Marital status: Legally Separated    Spouse name: Not on file  . Number of children: Not on file  . Years of education: Not on file  . Highest education level: Not on file  Occupational History  . Not on file  Tobacco Use  . Smoking status: Former Smoker    Packs/day: 1.00    Years: 44.00  Pack years: 44.00    Types: Cigarettes    Quit date: 11/17/2012    Years since quitting: 6.9  . Smokeless tobacco: Never Used  Substance and Sexual Activity  . Alcohol use: No  . Drug use: No  . Sexual activity: Never  Other Topics Concern  . Not on file  Social History Narrative  . Not on file   Social Determinants of Health   Financial Resource Strain:   . Difficulty of Paying Living Expenses: Not on file  Food Insecurity:   . Worried About Charity fundraiser in the Last Year: Not on file  . Ran Out of Food in the Last Year: Not on file  Transportation Needs:   . Lack of Transportation (Medical): Not on file  . Lack of Transportation (Non-Medical): Not on file  Physical Activity:   . Days of Exercise per Week: Not on file  . Minutes  of Exercise per Session: Not on file  Stress:   . Feeling of Stress : Not on file  Social Connections:   . Frequency of Communication with Friends and Family: Not on file  . Frequency of Social Gatherings with Friends and Family: Not on file  . Attends Religious Services: Not on file  . Active Member of Clubs or Organizations: Not on file  . Attends Archivist Meetings: Not on file  . Marital Status: Not on file  Intimate Partner Violence:   . Fear of Current or Ex-Partner: Not on file  . Emotionally Abused: Not on file  . Physically Abused: Not on file  . Sexually Abused: Not on file    Family History  Problem Relation Age of Onset  . Heart attack Mother   . Varicose Veins Neg Hx     No Known Allergies   Review of Systems   Review of Systems: Negative Unless Checked Constitutional: '[]'$ Weight loss  '[]'$ Fever  '[]'$ Chills Cardiac: '[]'$ Chest pain   '[]'$  Atrial Fibrillation  '[]'$ Palpitations   '[]'$ Shortness of breath when laying flat   '[]'$ Shortness of breath with exertion. '[]'$ Shortness of breath at rest Vascular:  '[]'$ Pain in legs with walking   '[]'$ Pain in legs with standing '[]'$ Pain in legs when laying flat   '[]'$ Claudication    '[]'$ Pain in feet when laying flat    '[]'$ History of DVT   '[]'$ Phlebitis   '[x]'$ Swelling in legs   '[]'$ Varicose veins   '[]'$ Non-healing ulcers Pulmonary:   '[]'$ Uses home oxygen   '[]'$ Productive cough   '[]'$ Hemoptysis   '[]'$ Wheeze  '[x]'$ COPD   '[]'$ Asthma Neurologic:  '[]'$ Dizziness   '[]'$ Seizures  '[]'$ Blackouts '[]'$ History of stroke   '[]'$ History of TIA  '[]'$ Aphasia   '[]'$ Temporary Blindness   '[]'$ Weakness or numbness in arm   '[x]'$ Weakness or numbness in leg Musculoskeletal:   '[]'$ Joint swelling   '[]'$ Joint pain   '[]'$ Low back pain  '[]'$  History of Knee Replacement '[]'$ Arthritis '[]'$ back Surgeries  '[]'$  Spinal Stenosis    Hematologic:  '[]'$ Easy bruising  '[]'$ Easy bleeding   '[]'$ Hypercoagulable state   '[]'$ Anemic Gastrointestinal:  '[]'$ Diarrhea   '[]'$ Vomiting  '[]'$ Gastroesophageal reflux/heartburn   '[]'$ Difficulty swallowing. '[]'$ Abdominal  pain Genitourinary:  '[]'$ Chronic kidney disease   '[]'$ Difficult urination  '[]'$ Anuric   '[]'$ Blood in urine '[]'$ Frequent urination  '[]'$ Burning with urination   '[]'$ Hematuria Skin:  '[]'$ Rashes   '[]'$ Ulcers '[]'$ Wounds Psychological:  '[]'$ History of anxiety   '[]'$  History of major depression  '[]'$  Memory Difficulties      OBJECTIVE:   Physical Exam  BP 109/66 (BP Location: Left Arm)   Pulse 98  Resp 16   Gen: WD/WN, NAD Head: [place]/AT, No temporalis wasting.  Ear/Nose/Throat: Hearing grossly intact, nares w/o erythema or drainage Eyes: PER, EOMI, sclera nonicteric.  Neck: Supple, no masses.  No JVD.  Pulmonary:  Good air movement, no use of accessory muscles.  Cardiac: RRR Vascular:  Wound VAC clean dry and intact, attached to medial thigh, significant drainage Vessel Right Left  Radial Palpable Palpable   Gastrointestinal: soft, non-distended. No guarding/no peritoneal signs.  Musculoskeletal: M/S 5/5 throughout.  No deformity or atrophy.  Neurologic: Pain and light touch intact in extremities.  Symmetrical.  Speech is fluent. Motor exam as listed above. Psychiatric: Judgment intact, Mood & affect appropriate for pt's clinical situation. Dermatologic:  No changes consistent with cellulitis. Lymph : No Cervical lymphadenopathy, no lichenification or skin changes of chronic lymphedema.       ASSESSMENT AND PLAN:  1. Abscess of left AKA stump Wound VAC is sealed well and is still draining significant amounts of blood.  No signs of infection.  Instructed facility not to change wound VAC on the day of the next visit as we will take it out to inspect wound healing.  Otherwise there continue to change dressings 3 times a week.  2. Essential hypertension Continue antihypertensive medications as already ordered, these medications have been reviewed and there are no changes at this time.   3. COPD with chronic bronchitis ([place]) Continue pulmonary medications and aerosols as already ordered, these medications have  been reviewed and there are no changes at this time.     Current Outpatient Medications on File Prior to Visit  Medication Sig Dispense Refill  . acetaminophen (TYLENOL) 325 MG tablet Take 650 mg by mouth every 4 (four) hours as needed for fever.     [name] acidophilus (RISAQUAD) CAPS capsule Take 1 capsule by mouth 2 (two) times daily.    [name] ammonium lactate (AMLACTIN) 12 % cream Apply topically 2 (two) times daily. Apply to right foot    . aspirin EC 81 MG tablet Take 1 tablet (81 mg total) by mouth daily. 150 tablet 2  . atorvastatin (LIPITOR) 10 MG tablet Take 1 tablet (10 mg total) by mouth daily. (Patient taking differently: Take 10 mg by mouth at bedtime. ) 30 tablet 11  . baclofen (LIORESAL) 10 MG tablet Take 10 mg by mouth 3 (three) times daily. Hold for sedation    . brimonidine-timolol (COMBIGAN) 0.2-0.5 % ophthalmic solution Place 1 drop into the right eye 2 (two) times daily.    . cefTRIAXone (ROCEPHIN) IVPB Inject 2 g into the vein daily for 23 days. Indication:  Left AKA site infection along with endovascular infection Last Day of Therapy:  10/27/2019 [facility] Care Per Protocol:including placement of biopatch  Labs Monday and Thursday weekly while on IV antibiotics: __X__ BMP __X_ Vancomycin trough  Labs every Monday CBC with diff/platelet  _X_ Please pull PIC at completion of IV antibiotics  Fax weekly labs to [facility]  [phone]  Call [phone] to make virtual appt 23 Units 0  . ciclopirox (PENLAC) 8 % solution Apply 1 application topically daily. Apply solutions to toenails    . Difluprednate (DUREZOL) 0.05 % EMUL Place 1 drop into the right eye 3 (three) times daily.     [name] docusate sodium (COLACE) 100 MG capsule Take 100 mg by mouth daily as needed for mild constipation.    [name] ELIQUIS 5 MG TABS tablet Take 5 mg by mouth 2 (two) times daily.     [name]  fentaNYL (DURAGESIC) 25 MCG/HR     . ferrous sulfate 324 (65 Fe) MG TBEC Take 324 mg by mouth 2 (two) times  daily.     . furosemide (LASIX) 40 MG tablet Take 1 tablet (40 mg total) by mouth daily. 30 tablet   . gabapentin (NEURONTIN) 100 MG capsule Take 100 mg by mouth at bedtime.     [name] guaiFENesin (ROBITUSSIN) 100 MG/5ML SOLN Take 15 mLs by mouth 3 (three) times daily as needed for cough (congestion).     [name] loperamide (IMODIUM) 2 MG capsule Take 2 mg by mouth 3 (three) times daily as needed for diarrhea or loose stools.    . Melatonin 5 MG TABS Take 5 mg by mouth at bedtime as needed (insomnia).     . Morphine Sulfate (MORPHINE CONCENTRATE) 10 mg / 0.5 ml concentrated solution     . nitroGLYCERIN (NITROSTAT) 0.4 MG SL tablet Place 0.14 mg under the tongue every 5 (five) minutes as needed for chest pain.    . Omega-3 Fatty Acids (FISH OIL) 1000 MG CAPS Take 1,000 mg by mouth daily.     . Oxycodone HCl 10 MG TABS Take by mouth.    [name] oxymetazoline (AFRIN) 0.05 % nasal spray Place 2 sprays into both nostrils 2 (two) times daily as needed (For nose bleeds).     . potassium chloride SA (K-DUR,KLOR-CON) 20 MEQ tablet Take 20 mEq by mouth daily. With or after meal    . sodium chloride (OCEAN) 0.65 % SOLN nasal spray Place 2 sprays into both nostrils 2 (two) times daily as needed for congestion.    . tamsulosin (FLOMAX) 0.4 MG CAPS capsule Take 1 capsule (0.4 mg total) by mouth daily. 30 capsule 0  . TRELEGY ELLIPTA 100-62.5-25 MCG/INH AEPB Inhale 1 puff into the lungs every morning.     . vancomycin IVPB Inject 1,250 mg into the vein daily for 22 days. Indication:  Left AKA Site infection along with endovascular infection Last Day of Therapy: 10/27/2019 Labs - Sunday/Monday:  CBC/D, BMP, and vancomycin trough. Labs - Thursday:  BMP and vancomycin trough Labs - Every other week:  ESR and CRP 22 Units 0  . fentaNYL (DURAGESIC) 12 MCG/HR Place 1 patch onto the skin every 3 (three) days. (Patient not taking: Reported on 10/19/2019) 1 patch 0  . HYDROcodone-acetaminophen (NORCO/VICODIN) 5-325 MG tablet Take 1-2  tablets by mouth every 8 (eight) hours as needed for up to 3 doses for moderate pain or severe pain. (Patient not taking: Reported on 10/19/2019) 6 tablet 0   No current facility-administered medications on file prior to visit.    There are no Patient Instructions on file for this visit. No follow-ups on file.   Kris Hartmann, NP  This note was completed with Sales executive.  Any errors are purely unintentional.

## 2019-11-10 ENCOUNTER — Ambulatory Visit (INDEPENDENT_AMBULATORY_CARE_PROVIDER_SITE_OTHER): Payer: Medicare Other | Admitting: Vascular Surgery

## 2019-11-10 ENCOUNTER — Other Ambulatory Visit: Payer: Self-pay

## 2019-11-10 ENCOUNTER — Ambulatory Visit (INDEPENDENT_AMBULATORY_CARE_PROVIDER_SITE_OTHER): Payer: Medicare Other | Admitting: Nurse Practitioner

## 2019-11-10 ENCOUNTER — Telehealth (INDEPENDENT_AMBULATORY_CARE_PROVIDER_SITE_OTHER): Payer: Self-pay

## 2019-11-10 ENCOUNTER — Ambulatory Visit: Payer: Medicaid Other | Admitting: Infectious Diseases

## 2019-11-10 ENCOUNTER — Encounter (INDEPENDENT_AMBULATORY_CARE_PROVIDER_SITE_OTHER): Payer: Self-pay | Admitting: Nurse Practitioner

## 2019-11-10 VITALS — BP 161/77 | HR 112 | Resp 20

## 2019-11-10 DIAGNOSIS — J449 Chronic obstructive pulmonary disease, unspecified: Secondary | ICD-10-CM | POA: Diagnosis not present

## 2019-11-10 DIAGNOSIS — L02416 Cutaneous abscess of left lower limb: Secondary | ICD-10-CM | POA: Diagnosis not present

## 2019-11-10 DIAGNOSIS — I10 Essential (primary) hypertension: Secondary | ICD-10-CM

## 2019-11-10 NOTE — Telephone Encounter (Signed)
Spoke with Decarla RN and made her aware of the NP instructions.

## 2019-11-10 NOTE — Telephone Encounter (Signed)
She can remove if the patient isn't having very much drainage.  If he's still having a good bit of drainage it should stay.

## 2019-11-10 NOTE — Telephone Encounter (Signed)
Lyn Morris the wound care nurse at [facility] [place] called and wanted to make [facility] aware that the pt's Wound is small now and that he is  On negative pressure and wants to know does he still need it. Please advise.

## 2019-11-14 ENCOUNTER — Encounter (INDEPENDENT_AMBULATORY_CARE_PROVIDER_SITE_OTHER): Payer: Self-pay | Admitting: Nurse Practitioner

## 2019-11-14 NOTE — Progress Notes (Signed)
Subjective:    Patient ID: Timothy Juarez, male    DOB: 10-07-1950, 69 y.o.   MRN: [id number] Chief Complaint  Patient presents with  . Follow-up     wound check    Today [name] returns for evaluation of abscess in his left above-knee amputation.  This recently underwent an incision and drainage with wound VAC placement.  The wound has closed significantly.  The patient states that the wound feels much better as well.  The drainage current for the wound has also decreased greatly according to the nurses at his facility.  He denies any fever, chills, nausea, vomiting or diarrhea   Review of Systems  Skin: Positive for wound.  Neurological: Positive for weakness.  Psychiatric/Behavioral: Positive for confusion.  All other systems reviewed and are negative.      Objective:   Physical Exam Vitals reviewed.  Cardiovascular:     Rate and Rhythm: Normal rate and regular rhythm.     Pulses: Normal pulses.  Musculoskeletal:        General: Swelling and deformity present.     Left Lower Extremity: Left leg is amputated above knee.  Skin:    Capillary Refill: Capillary refill takes less than 2 seconds.     Findings: Wound present.          BP (!) 161/77 (BP Location: Right Arm)   Pulse (!) 112   Resp 20   Past Medical History:  Diagnosis Date  . Acute embolism and thombos unsp deep vn unsp lower extremity ([place])   . Acute respiratory failure ([place])   . Allergy   . Anemia   . Aneurysm of unspecified site ([place])   . ARF (acute respiratory failure) ([place])    H/O  . Bronchitis   . CHF (congestive heart failure) ([place])   . COPD (chronic obstructive pulmonary disease) ([place])   . Cough   . Epistaxis   . GERD (gastroesophageal reflux disease)   . Gout   . Hyperlipidemia   . Hypertension   . Hypokalemia   . Insomnia   . Muscle contracture    MUSCLE SPASMS, muscle weakness  . Peripheral vascular disease ([place])   . Pneumonia   . Pressure ulcer     Social History    Socioeconomic History  . Marital status: Legally Separated    Spouse name: Not on file  . Number of children: Not on file  . Years of education: Not on file  . Highest education level: Not on file  Occupational History  . Not on file  Tobacco Use  . Smoking status: Former Smoker    Packs/day: 1.00    Years: 44.00    Pack years: 44.00    Types: Cigarettes    Quit date: 11/17/2012    Years since quitting: 6.9  . Smokeless tobacco: Never Used  Substance and Sexual Activity  . Alcohol use: No  . Drug use: No  . Sexual activity: Never  Other Topics Concern  . Not on file  Social History Narrative  . Not on file   Social Determinants of Health   Financial Resource Strain:   . Difficulty of Paying Living Expenses:   Food Insecurity:   . Worried About Charity fundraiser in the Last Year:   . Arboriculturist in the Last Year:   Transportation Needs:   . Film/video editor (Medical):   [name] Lack of Transportation (Non-Medical):   Physical Activity:   . Days  of Exercise per Week:   . Minutes of Exercise per Session:   Stress:   . Feeling of Stress :   Social Connections:   . Frequency of Communication with Friends and Family:   . Frequency of Social Gatherings with Friends and Family:   . Attends Religious Services:   . Active Member of Clubs or Organizations:   . Attends Archivist Meetings:   [name] Marital Status:   Intimate Partner Violence:   . Fear of Current or Ex-Partner:   . Emotionally Abused:   [name] Physically Abused:   . Sexually Abused:     Past Surgical History:  Procedure Laterality Date  . AMPUTATION Left 09/19/2015   Procedure: AMPUTATION BELOW KNEE;  Surgeon: Algernon Huxley, MD;  Location: [facility];  Service: Vascular;  Laterality: Left;  . AMPUTATION Left 11/01/2015   Procedure: AMPUTATION ABOVE KNEE;  Surgeon: Algernon Huxley, MD;  Location: [facility];  Service: Vascular;  Laterality: Left;  . AMPUTATION Left 09/29/2019   Procedure: IRRIGATION AND  DEBRIDEMENT OF LEFT AKA;  Surgeon: Algernon Huxley, MD;  Location: [facility];  Service: General;  Laterality: Left;  . APPLICATION OF WOUND VAC Left 10/18/2015   Procedure: APPLICATION OF WOUND VAC;  Surgeon: Algernon Huxley, MD;  Location: [facility];  Service: Vascular;  Laterality: Left;  . APPLICATION OF WOUND VAC Left 09/30/2019   Procedure: APPLICATION OF WOUND VAC;  Surgeon: Katha Cabal, MD;  Location: [facility];  Service: Vascular;  Laterality: Left;  serial # [id number]  . CATARACT EXTRACTION W/PHACO Right 08/18/2019   Procedure: CATARACT EXTRACTION PHACO AND INTRAOCULAR LENS PLACEMENT ([place]) RIGHT;  Surgeon: Birder Robson, MD;  Location: [facility];  Service: Ophthalmology;  Laterality: Right;  [place] 03:29.0 CDE 45.68 Fluid Pack Lot # [id number]  . CENTRAL VENOUS CATHETER INSERTION Right 09/30/2019   Procedure: INSERTION CENTRAL LINE ADULT;  Surgeon: Katha Cabal, MD;  Location: [facility];  Service: Vascular;  Laterality: Right;  . COLONOSCOPY WITH PROPOFOL N/A 05/25/2017   Procedure: COLONOSCOPY WITH PROPOFOL;  Surgeon: Jonathon Bellows, MD;  Location: [facility] ENDOSCOPY;  Service: Gastroenterology;  Laterality: N/A;  . COLONOSCOPY WITH PROPOFOL N/A 10/14/2017   Procedure: COLONOSCOPY WITH PROPOFOL;  Surgeon: Jonathon Bellows, MD;  Location: [facility] ENDOSCOPY;  Service: Gastroenterology;  Laterality: N/A;  . ESOPHAGOGASTRODUODENOSCOPY (EGD) WITH PROPOFOL N/A 05/25/2017   Procedure: ESOPHAGOGASTRODUODENOSCOPY (EGD) WITH PROPOFOL;  Surgeon: Jonathon Bellows, MD;  Location: [facility] ENDOSCOPY;  Service: Gastroenterology;  Laterality: N/A;  . ESOPHAGOGASTRODUODENOSCOPY (EGD) WITH PROPOFOL N/A 10/14/2017   Procedure: ESOPHAGOGASTRODUODENOSCOPY (EGD) WITH PROPOFOL;  Surgeon: Jonathon Bellows, MD;  Location: [facility] ENDOSCOPY;  Service: Gastroenterology;  Laterality: N/A;  . EYE SURGERY    . GIVENS CAPSULE STUDY N/A 07/14/2017   Procedure: GIVENS CAPSULE STUDY;  Surgeon: Jonathon Bellows, MD;  Location: [facility] ENDOSCOPY;  Service:  Gastroenterology;  Laterality: N/A;  . GIVENS CAPSULE STUDY N/A 10/30/2017   Procedure: GIVENS CAPSULE STUDY 12 HR;  Surgeon: Jonathon Bellows, MD;  Location: [facility] ENDOSCOPY;  Service: Gastroenterology;  Laterality: N/A;  . IVC FILTER INSERTION N/A 10/12/2017   Procedure: IVC FILTER INSERTION;  Surgeon: Algernon Huxley, MD;  Location: [place] CV LAB;  Service: Cardiovascular;  Laterality: N/A;  . LOWER EXTREMITY ANGIOGRAPHY Right 10/04/2018   Procedure: LOWER EXTREMITY ANGIOGRAPHY;  Surgeon: Algernon Huxley, MD;  Location: [place] CV LAB;  Service: Cardiovascular;  Laterality: Right;  . PERIPHERAL VASCULAR CATHETERIZATION Left 01/18/2015   Procedure: Lower Extremity Angiography;  Surgeon: Erskine Squibb  Lucky Cowboy, MD;  Location: [place] CV LAB;  Service: Cardiovascular;  Laterality: Left;  . PERIPHERAL VASCULAR CATHETERIZATION N/A 01/18/2015   Procedure: Lower Extremity Intervention;  Surgeon: Algernon Huxley, MD;  Location: [place] CV LAB;  Service: Cardiovascular;  Laterality: N/A;  . PERIPHERAL VASCULAR CATHETERIZATION  07/30/2015   Procedure: Lower Extremity Intervention;  Surgeon: Algernon Huxley, MD;  Location: [place] CV LAB;  Service: Cardiovascular;;  . PERIPHERAL VASCULAR CATHETERIZATION N/A 07/30/2015   Procedure: Abdominal Aortogram w/Lower Extremity;  Surgeon: Algernon Huxley, MD;  Location: [place] CV LAB;  Service: Cardiovascular;  Laterality: N/A;  . PERIPHERAL VASCULAR CATHETERIZATION Left 08/22/2015   Procedure: Lower Extremity Angiography;  Surgeon: Algernon Huxley, MD;  Location: [place] CV LAB;  Service: Cardiovascular;  Laterality: Left;  . PERIPHERAL VASCULAR CATHETERIZATION Left 08/22/2015   Procedure: Lower Extremity Intervention;  Surgeon: Algernon Huxley, MD;  Location: [place] CV LAB;  Service: Cardiovascular;  Laterality: Left;  . PERIPHERAL VASCULAR CATHETERIZATION Right 03/31/2016   Procedure: Lower Extremity Angiography;  Surgeon: Algernon Huxley, MD;  Location: [place]  CV LAB;  Service: Cardiovascular;  Laterality: Right;  . PERIPHERAL VASCULAR CATHETERIZATION  03/31/2016   Procedure: Lower Extremity Intervention;  Surgeon: Algernon Huxley, MD;  Location: [place] CV LAB;  Service: Cardiovascular;;  . PERIPHERAL VASCULAR CATHETERIZATION Left 04/10/2016   Procedure: Lower Extremity Angiography;  Surgeon: Algernon Huxley, MD;  Location: [place] CV LAB;  Service: Cardiovascular;  Laterality: Left;  [name] VACUUM ASSISTED CLOSURE CHANGE Left 10/03/2019   Procedure: LEFT THIGH VACUUM ASSISTED CLOSURE CHANGE;  Surgeon: Algernon Huxley, MD;  Location: [facility];  Service: General;  Laterality: Left;  . WOUND DEBRIDEMENT Left 10/18/2015   Procedure: DEBRIDEMENT WOUND   ( LEFT BKA DEBRIDEMENT );  Surgeon: Algernon Huxley, MD;  Location: [facility];  Service: Vascular;  Laterality: Left;    Family History  Problem Relation Age of Onset  . Heart attack Mother   . Varicose Veins Neg Hx     No Known Allergies     Assessment & Plan:   1. Abscess of left AKA stump Patient states that the lower extremities feeling much better at this time.  The wound has closed significantly and the drainage has greatly decreased.  At this time we will transition the patient to twice a day wet-to-dry dressings.  This information has been sent to the patient's facility as well.  We will have the patient return in 6 weeks for wound evaluation.  Sooner if issues should arise.  2. Essential hypertension Continue antihypertensive medications as already ordered, these medications have been reviewed and there are no changes at this time.   3. COPD with chronic bronchitis ([place]) Continue pulmonary medications and aerosols as already ordered, these medications have been reviewed and there are no changes at this time.     Current Outpatient Medications on File Prior to Visit  Medication Sig Dispense Refill  . acetaminophen (TYLENOL) 325 MG tablet Take 650 mg by mouth every 4 (four) hours as needed for  fever.     [name] acidophilus (RISAQUAD) CAPS capsule Take 1 capsule by mouth 2 (two) times daily.    [name] ammonium lactate (AMLACTIN) 12 % cream Apply topically 2 (two) times daily. Apply to right foot    . aspirin EC 81 MG tablet Take 1 tablet (81 mg total) by mouth daily. 150 tablet 2  . baclofen (LIORESAL) 10 MG tablet Take 10 mg by  mouth 3 (three) times daily. Hold for sedation    . brimonidine-timolol (COMBIGAN) 0.2-0.5 % ophthalmic solution Place 1 drop into the right eye 2 (two) times daily.    . ciclopirox (PENLAC) 8 % solution Apply 1 application topically daily. Apply solutions to toenails    . Difluprednate (DUREZOL) 0.05 % EMUL Place 1 drop into the right eye 3 (three) times daily.     [name] docusate sodium (COLACE) 100 MG capsule Take 100 mg by mouth daily as needed for mild constipation.    [name] ELIQUIS 5 MG TABS tablet Take 5 mg by mouth 2 (two) times daily.     . fentaNYL (DURAGESIC) 25 MCG/HR     . ferrous sulfate 324 (65 Fe) MG TBEC Take 324 mg by mouth 2 (two) times daily.     . furosemide (LASIX) 40 MG tablet Take 1 tablet (40 mg total) by mouth daily. 30 tablet   . gabapentin (NEURONTIN) 100 MG capsule Take 100 mg by mouth at bedtime.     [name] guaiFENesin (ROBITUSSIN) 100 MG/5ML SOLN Take 15 mLs by mouth 3 (three) times daily as needed for cough (congestion).     [name] HYDROcodone-acetaminophen (NORCO/VICODIN) 5-325 MG tablet Take 1-2 tablets by mouth every 8 (eight) hours as needed for up to 3 doses for moderate pain or severe pain. 6 tablet 0  . loperamide (IMODIUM) 2 MG capsule Take 2 mg by mouth 3 (three) times daily as needed for diarrhea or loose stools.    . Melatonin 5 MG TABS Take 5 mg by mouth at bedtime as needed (insomnia).     . Morphine Sulfate (MORPHINE CONCENTRATE) 10 mg / 0.5 ml concentrated solution     . nitroGLYCERIN (NITROSTAT) 0.4 MG SL tablet Place 0.14 mg under the tongue every 5 (five) minutes as needed for chest pain.    . Omega-3 Fatty Acids (FISH OIL) 1000 MG CAPS Take  1,000 mg by mouth daily.     . Oxycodone HCl 10 MG TABS Take by mouth.    [name] oxymetazoline (AFRIN) 0.05 % nasal spray Place 2 sprays into both nostrils 2 (two) times daily as needed (For nose bleeds).     . potassium chloride SA (K-DUR,KLOR-CON) 20 MEQ tablet Take 20 mEq by mouth daily. With or after meal    . sodium chloride (OCEAN) 0.65 % SOLN nasal spray Place 2 sprays into both nostrils 2 (two) times daily as needed for congestion.    . tamsulosin (FLOMAX) 0.4 MG CAPS capsule Take 1 capsule (0.4 mg total) by mouth daily. 30 capsule 0  . TRELEGY ELLIPTA 100-62.5-25 MCG/INH AEPB Inhale 1 puff into the lungs every morning.     [name] atorvastatin (LIPITOR) 10 MG tablet Take 1 tablet (10 mg total) by mouth daily. (Patient taking differently: Take 10 mg by mouth at bedtime. ) 30 tablet 11  . fentaNYL (DURAGESIC) 12 MCG/HR Place 1 patch onto the skin every 3 (three) days. (Patient not taking: Reported on 10/19/2019) 1 patch 0   No current facility-administered medications on file prior to visit.    There are no Patient Instructions on file for this visit. No follow-ups on file.   Kris Hartmann, NP

## 2019-11-15 ENCOUNTER — Other Ambulatory Visit: Payer: Self-pay | Admitting: Infectious Diseases

## 2019-11-22 ENCOUNTER — Ambulatory Visit: Payer: Medicare Other | Attending: Infectious Diseases | Admitting: Infectious Diseases

## 2019-11-22 ENCOUNTER — Encounter: Payer: Self-pay | Admitting: Infectious Diseases

## 2019-11-22 ENCOUNTER — Other Ambulatory Visit: Payer: Self-pay

## 2019-11-22 VITALS — BP 113/68 | HR 73 | Resp 16 | Ht 72.0 in | Wt 210.0 lb

## 2019-11-22 DIAGNOSIS — T879 Unspecified complications of amputation stump: Secondary | ICD-10-CM

## 2019-11-22 NOTE — Progress Notes (Signed)
NAME: Timothy Juarez  DOB: 18-Feb-1951  MRN: [id number]  Date/Time: 11/22/2019 9:46 AM  REQUESTING PROVIDER Subjective:  REASON FOR CONSULT:  Follow up visit after completion of IV antibiotic for left AKA stump abscess Pt was recently in [facility] between 2/9-2/19 for rt AKA stump abscess  ? [name] is a 69 y.o. male with a history of LEFt AKA in March 2017 presented to Dr.Dew on 09/27/19 with swelling and pain at the site of the stump going on for a few weeks. He has PAD and has Viabahn stents in the SFA . He was asked to get admitted for surgery and t underwent  I/D of the abscess at the stump site. As per Dr.Dew's note he had a very large cavity  the size of a basketball with raw tissue and oozing purulent stuff. He had 500cc blood loss. Several of the SFA stents were removed. Cultures sent- which were negative. He was initially getting IV vanco and zosyn. He was taken back to OR for further debridement and had wound vac. He was discharged to [facility] on vanco /ceftriaxone on 2/19 to complete 4 weeks- he finished antibiotics on 3/11 and PICc was removed He saw vascular NP on 3/25 and the wound was healing Today he is here and feeling fine- He has a residual wound rt inner thigh which is almost closed No fever , no chills  Pt is in [facility] for the past 4 years Past Medical History:  Diagnosis Date  . Acute embolism and thombos unsp deep vn unsp lower extremity ([place])   . Acute respiratory failure ([place])   . Allergy   . Anemia   . Aneurysm of unspecified site ([place])   . ARF (acute respiratory failure) ([place])    H/O  . Bronchitis   . CHF (congestive heart failure) ([place])   . COPD (chronic obstructive pulmonary disease) ([place])   . Cough   . Epistaxis   . GERD (gastroesophageal reflux disease)   . Gout   . Hyperlipidemia   . Hypertension   . Hypokalemia   . Insomnia   . Muscle contracture    MUSCLE SPASMS, muscle weakness  . Peripheral vascular disease ([place])   .  Pneumonia   . Pressure ulcer     Past Surgical History:  Procedure Laterality Date  . AMPUTATION Left 09/19/2015   Procedure: AMPUTATION BELOW KNEE;  Surgeon: Algernon Huxley, MD;  Location: [facility] ORS;  Service: Vascular;  Laterality: Left;  . AMPUTATION Left 11/01/2015   Procedure: AMPUTATION ABOVE KNEE;  Surgeon: Algernon Huxley, MD;  Location: [facility] ORS;  Service: Vascular;  Laterality: Left;  . AMPUTATION Left 09/29/2019   Procedure: IRRIGATION AND DEBRIDEMENT OF LEFT AKA;  Surgeon: Algernon Huxley, MD;  Location: [facility] ORS;  Service: General;  Laterality: Left;  . APPLICATION OF WOUND VAC Left 10/18/2015   Procedure: APPLICATION OF WOUND VAC;  Surgeon: Algernon Huxley, MD;  Location: [facility] ORS;  Service: Vascular;  Laterality: Left;  . APPLICATION OF WOUND VAC Left 09/30/2019   Procedure: APPLICATION OF WOUND VAC;  Surgeon: Katha Cabal, MD;  Location: [facility] OR[facility];  Service: Vascular;  Laterality: Left;  serial # [id number]  . CATARACT EXTRACTION W/PHACO Right 08/18/2019   Procedure: CATARACT EXTRACTION PHACO AND INTRAOCULAR LENS PLACEMENT ([place]) RIGHT;  Surgeon: Birder Robson, MD;  Location: [facility];  Service: Ophthalmology;  Laterality: Right;  [place] 03:29.0 CDE 45.68 Fluid Pack Lot # [id number]  . CENTRAL VENOUS CATHETER  INSERTION Right 09/30/2019   Procedure: INSERTION CENTRAL LINE ADULT;  Surgeon: Katha Cabal, MD;  Location: [facility] ORS;  Service: Vascular;  Laterality: Right;  . COLONOSCOPY WITH PROPOFOL N/A 05/25/2017   Procedure: COLONOSCOPY WITH PROPOFOL;  Surgeon: Jonathon Bellows, MD;  Location: [facility] ENDOSCOPY;  Service: Gastroenterology;  Laterality: N/A;  . COLONOSCOPY WITH PROPOFOL N/A 10/14/2017   Procedure: COLONOSCOPY WITH PROPOFOL;  Surgeon: Jonathon Bellows, MD;  Location: [facility] ENDOSCOPY;  Service: Gastroenterology;  Laterality: N/A;  . ESOPHAGOGASTRODUODENOSCOPY (EGD) WITH PROPOFOL N/A 05/25/2017   Procedure: ESOPHAGOGASTRODUODENOSCOPY (EGD) WITH PROPOFOL;  Surgeon: Jonathon Bellows, MD;  Location: [facility]  ENDOSCOPY;  Service: Gastroenterology;  Laterality: N/A;  . ESOPHAGOGASTRODUODENOSCOPY (EGD) WITH PROPOFOL N/A 10/14/2017   Procedure: ESOPHAGOGASTRODUODENOSCOPY (EGD) WITH PROPOFOL;  Surgeon: Jonathon Bellows, MD;  Location: [facility] ENDOSCOPY;  Service: Gastroenterology;  Laterality: N/A;  . EYE SURGERY    . GIVENS CAPSULE STUDY N/A 07/14/2017   Procedure: GIVENS CAPSULE STUDY;  Surgeon: Jonathon Bellows, MD;  Location: [facility] ENDOSCOPY;  Service: Gastroenterology;  Laterality: N/A;  . GIVENS CAPSULE STUDY N/A 10/30/2017   Procedure: GIVENS CAPSULE STUDY 12 HR;  Surgeon: Jonathon Bellows, MD;  Location: [facility] ENDOSCOPY;  Service: Gastroenterology;  Laterality: N/A;  . IVC FILTER INSERTION N/A 10/12/2017   Procedure: IVC FILTER INSERTION;  Surgeon: Algernon Huxley, MD;  Location: [place] CV LAB;  Service: Cardiovascular;  Laterality: N/A;  . LOWER EXTREMITY ANGIOGRAPHY Right 10/04/2018   Procedure: LOWER EXTREMITY ANGIOGRAPHY;  Surgeon: Algernon Huxley, MD;  Location: [place] CV LAB;  Service: Cardiovascular;  Laterality: Right;  . PERIPHERAL VASCULAR CATHETERIZATION Left 01/18/2015   Procedure: Lower Extremity Angiography;  Surgeon: Algernon Huxley, MD;  Location: [place] CV LAB;  Service: Cardiovascular;  Laterality: Left;  . PERIPHERAL VASCULAR CATHETERIZATION N/A 01/18/2015   Procedure: Lower Extremity Intervention;  Surgeon: Algernon Huxley, MD;  Location: [place] CV LAB;  Service: Cardiovascular;  Laterality: N/A;  . PERIPHERAL VASCULAR CATHETERIZATION  07/30/2015   Procedure: Lower Extremity Intervention;  Surgeon: Algernon Huxley, MD;  Location: [place] CV LAB;  Service: Cardiovascular;;  . PERIPHERAL VASCULAR CATHETERIZATION N/A 07/30/2015   Procedure: Abdominal Aortogram w/Lower Extremity;  Surgeon: Algernon Huxley, MD;  Location: [place] CV LAB;  Service: Cardiovascular;  Laterality: N/A;  . PERIPHERAL VASCULAR CATHETERIZATION Left 08/22/2015   Procedure: Lower Extremity Angiography;  Surgeon: Algernon Huxley, MD;  Location: [place] CV LAB;  Service: Cardiovascular;  Laterality: Left;  . PERIPHERAL VASCULAR CATHETERIZATION Left 08/22/2015   Procedure: Lower Extremity Intervention;  Surgeon: Algernon Huxley, MD;  Location: [place] CV LAB;  Service: Cardiovascular;  Laterality: Left;  . PERIPHERAL VASCULAR CATHETERIZATION Right 03/31/2016   Procedure: Lower Extremity Angiography;  Surgeon: Algernon Huxley, MD;  Location: [place] CV LAB;  Service: Cardiovascular;  Laterality: Right;  . PERIPHERAL VASCULAR CATHETERIZATION  03/31/2016   Procedure: Lower Extremity Intervention;  Surgeon: Algernon Huxley, MD;  Location: [place] CV LAB;  Service: Cardiovascular;;  . PERIPHERAL VASCULAR CATHETERIZATION Left 04/10/2016   Procedure: Lower Extremity Angiography;  Surgeon: Algernon Huxley, MD;  Location: [place] CV LAB;  Service: Cardiovascular;  Laterality: Left;  [name] VACUUM ASSISTED CLOSURE CHANGE Left 10/03/2019   Procedure: LEFT THIGH VACUUM ASSISTED CLOSURE CHANGE;  Surgeon: Algernon Huxley, MD;  Location: [facility] ORS;  Service: General;  Laterality: Left;  . WOUND DEBRIDEMENT Left 10/18/2015   Procedure: DEBRIDEMENT WOUND   ( LEFT BKA DEBRIDEMENT );  Surgeon: Algernon Huxley,  MD;  Location: [facility] ORS;  Service: Vascular;  Laterality: Left;    Social History   Socioeconomic History  . Marital status: Legally Separated    Spouse name: Not on file  . Number of children: Not on file  . Years of education: Not on file  . Highest education level: Not on file  Occupational History  . Not on file  Tobacco Use  . Smoking status: Former Smoker    Packs/day: 1.00    Years: 44.00    Pack years: 44.00    Types: Cigarettes    Quit date: 11/17/2012    Years since quitting: 7.0  . Smokeless tobacco: Never Used  Substance and Sexual Activity  . Alcohol use: No  . Drug use: No  . Sexual activity: Never  Other Topics Concern  . Not on file  Social History Narrative  . Not on file   Social Determinants of  Health   Financial Resource Strain:   . Difficulty of Paying Living Expenses:   Food Insecurity:   . Worried About Charity fundraiser in the Last Year:   . Arboriculturist in the Last Year:   Transportation Needs:   . Film/video editor (Medical):   [name] Lack of Transportation (Non-Medical):   Physical Activity:   . Days of Exercise per Week:   . Minutes of Exercise per Session:   Stress:   . Feeling of Stress :   Social Connections:   . Frequency of Communication with Friends and Family:   . Frequency of Social Gatherings with Friends and Family:   . Attends Religious Services:   . Active Member of Clubs or Organizations:   . Attends Archivist Meetings:   [name] Marital Status:   Intimate Partner Violence:   . Fear of Current or Ex-Partner:   . Emotionally Abused:   [name] Physically Abused:   . Sexually Abused:     Family History  Problem Relation Age of Onset  . Heart attack Mother   . Varicose Veins Neg Hx    No Known Allergies  ? Current Outpatient Medications  Medication Sig Dispense Refill  . acetaminophen (TYLENOL) 325 MG tablet Take 650 mg by mouth every 4 (four) hours as needed for fever.     [name] acidophilus (RISAQUAD) CAPS capsule Take 1 capsule by mouth 2 (two) times daily.    [name] ammonium lactate (AMLACTIN) 12 % cream Apply topically 2 (two) times daily. Apply to right foot    . aspirin EC 81 MG tablet Take 1 tablet (81 mg total) by mouth daily. 150 tablet 2  . atorvastatin (LIPITOR) 10 MG tablet Take 1 tablet (10 mg total) by mouth daily. (Patient taking differently: Take 10 mg by mouth at bedtime. ) 30 tablet 11  . baclofen (LIORESAL) 10 MG tablet Take 10 mg by mouth 3 (three) times daily. Hold for sedation    . brimonidine-timolol (COMBIGAN) 0.2-0.5 % ophthalmic solution Place 1 drop into the right eye 2 (two) times daily.    . ciclopirox (PENLAC) 8 % solution Apply 1 application topically daily. Apply solutions to toenails    . Difluprednate (DUREZOL)  0.05 % EMUL Place 1 drop into the right eye 3 (three) times daily.     . Difluprednate (DUREZOL) 0.05 % EMUL Apply 1 drop to eye daily.    [name] docusate sodium (COLACE) 100 MG capsule Take 100 mg by mouth daily as needed for mild constipation.    [name] ELIQUIS  5 MG TABS tablet Take 5 mg by mouth 2 (two) times daily.     . fentaNYL (DURAGESIC) 25 MCG/HR     . ferrous sulfate 324 (65 Fe) MG TBEC Take 324 mg by mouth 2 (two) times daily.     . furosemide (LASIX) 40 MG tablet Take 1 tablet (40 mg total) by mouth daily. 30 tablet   . gabapentin (NEURONTIN) 100 MG capsule Take 100 mg by mouth at bedtime.     [name] guaiFENesin (ROBITUSSIN) 100 MG/5ML SOLN Take 15 mLs by mouth 3 (three) times daily as needed for cough (congestion).     [name] loperamide (IMODIUM) 2 MG capsule Take 2 mg by mouth 3 (three) times daily as needed for diarrhea or loose stools.    . Melatonin 5 MG TABS Take 5 mg by mouth at bedtime as needed (insomnia).     . nitroGLYCERIN (NITROSTAT) 0.4 MG SL tablet Place 0.14 mg under the tongue every 5 (five) minutes as needed for chest pain.    . Omega-3 Fatty Acids (FISH OIL) 1000 MG CAPS Take 1,000 mg by mouth daily.     . Oxycodone HCl 10 MG TABS Take by mouth.    [name] oxymetazoline (AFRIN) 0.05 % nasal spray Place 2 sprays into both nostrils 2 (two) times daily as needed (For nose bleeds).     . potassium chloride SA (K-DUR,KLOR-CON) 20 MEQ tablet Take 20 mEq by mouth daily. With or after meal    . sodium chloride (OCEAN) 0.65 % SOLN nasal spray Place 2 sprays into both nostrils 2 (two) times daily as needed for congestion.    . tamsulosin (FLOMAX) 0.4 MG CAPS capsule Take 1 capsule (0.4 mg total) by mouth daily. 30 capsule 0  . TRELEGY ELLIPTA 100-62.5-25 MCG/INH AEPB Inhale 1 puff into the lungs every morning.     . triamcinolone cream (KENALOG) 0.1 % Apply 1 application topically 2 (two) times daily.    . Morphine Sulfate (MORPHINE CONCENTRATE) 10 mg / 0.5 ml concentrated solution      No current  facility-administered medications for this visit.     Abtx:  Anti-infectives (From admission, onward)   None      REVIEW OF SYSTEMS:  Const: negative fever, negative chills, negative weight loss Eyes: negative diplopia or visual changes, negative eye pain ENT: negative coryza, negative sore throat Resp: negative cough, hemoptysis, dyspnea Cards: negative for chest pain, palpitations, lower extremity edema GU: negative for frequency, dysuria and hematuria GI: Negative for abdominal pain, diarrhea, bleeding, constipation Skin: negative for rash and pruritus Heme: negative for easy bruising and gum/nose bleeding MS: negative for myalgias, arthralgias, back pain and muscle weakness Neurolo:negative for headaches, dizziness, vertigo, memory problems  Psych: negative for feelings of anxiety, depression   Allergy/Immunology- as above Objective:  VITALS:  BP 113/68   Pulse 73   Resp 16   Ht 6' (1.829 m)   Wt 210 lb (95.3 kg)   SpO2 94%   BMI 28.48 kg/m  PHYSICAL EXAM:  In wheel chair- so examination is limited General: Alert, cooperative, no distress, appears stated age.  Head: Normocephalic, without obvious abnormality, atraumatic. Eyes: Conjunctivae clear, anicteric sclerae. Pupils are equal ENT Nares normal. No drainage or sinus tenderness. Lips, mucosa, and tongue normal. No Thrush Neck: Supple, Lungs: Clear to auscultation bilaterally. No Wheezing or Rhonchi. No rales. Heart: Regular rate and rhythm, no murmur, rub or gallop. Abdomen: did not examine Extremities:left AKA- stump site healed - some scab Inner thigh  has a healing wound Some induration around     09/30/19    09/28/19       Skin: No rashes or lesions. Or bruising Lymph: Cervical, supraclavicular normal. Neurologic: Grossly non-focal Pertinent Labs Lab Results 10/27/19 ( scanned in media) WBC 9.8 HB 10.3 PLT 326 Cr 0.92  ? Impression/Recommendation ?Left AKA stump infection with huge  abscess- s/p drainage, debridement and wound vac 2/10 and the site has closed  stentsin SFA were exposed and were removed- was treated as skin and soft tissue and endovascular infection with 4 weeks of IV vanco and ceftriaxone which he completed on 10/27/19 The stump has healed except for rt inner thigh wound- which is small  No obvious infection . No further antibiotics needed- only local wound care [facility] to get WBC   Anemia following surgery. Had received PRBC.Marland Kitchenlast Hb 10.3   Discussed the management with care team ? ? ___________________________________________________ Discussed with patient,  Follow PRN

## 2019-12-22 ENCOUNTER — Ambulatory Visit (INDEPENDENT_AMBULATORY_CARE_PROVIDER_SITE_OTHER): Payer: Medicare Other | Admitting: Nurse Practitioner

## 2019-12-23 ENCOUNTER — Encounter (INDEPENDENT_AMBULATORY_CARE_PROVIDER_SITE_OTHER): Payer: Self-pay | Admitting: Nurse Practitioner

## 2019-12-23 ENCOUNTER — Other Ambulatory Visit: Payer: Self-pay

## 2019-12-23 ENCOUNTER — Ambulatory Visit (INDEPENDENT_AMBULATORY_CARE_PROVIDER_SITE_OTHER): Payer: Medicare Other | Admitting: Nurse Practitioner

## 2019-12-23 VITALS — BP 137/76 | HR 67 | Resp 16

## 2019-12-23 DIAGNOSIS — I739 Peripheral vascular disease, unspecified: Secondary | ICD-10-CM | POA: Diagnosis not present

## 2019-12-23 DIAGNOSIS — L02416 Cutaneous abscess of left lower limb: Secondary | ICD-10-CM | POA: Diagnosis not present

## 2019-12-23 DIAGNOSIS — E785 Hyperlipidemia, unspecified: Secondary | ICD-10-CM | POA: Diagnosis not present

## 2019-12-23 DIAGNOSIS — I10 Essential (primary) hypertension: Secondary | ICD-10-CM

## 2019-12-23 NOTE — Progress Notes (Signed)
Subjective:    Patient ID: Timothy Juarez, male    DOB: 25-Oct-1950, 69 y.o.   MRN: [id number] Chief Complaint  Patient presents with  . Follow-up    6wk wound check    Today the patient returns for evaluation of the left above-knee amputation wound site after having the abscess drained.  Today the wound is completely healed.  There is no evidence of opening.  The patient states that he has no tenderness or pain.  He denies any drainage from the area.  Denies any fever, chills, nausea, vomiting or diarrhea.  He denies any ulcerations on his right lower extremity.  He denies any rest pain or claudication-like symptoms.  Overall the patient states that he is feeling well.   Review of Systems  Musculoskeletal: Positive for arthralgias.  Neurological: Positive for weakness.  All other systems reviewed and are negative.      Objective:   Physical Exam Vitals reviewed.  Constitutional:      Appearance: Normal appearance.  HENT:     Head: Normocephalic.  Cardiovascular:     Rate and Rhythm: Normal rate and regular rhythm.     Pulses: Normal pulses.     Heart sounds: Normal heart sounds.  Pulmonary:     Effort: Pulmonary effort is normal.     Breath sounds: Normal breath sounds.  Musculoskeletal:     Left Lower Extremity: Left leg is amputated above knee.  Neurological:     Mental Status: He is alert and oriented to person, place, and time.  Psychiatric:        Mood and Affect: Mood normal.        Behavior: Behavior normal.        Thought Content: Thought content normal.        Judgment: Judgment normal.     BP 137/76 (BP Location: Right Arm)   Pulse 67   Resp 16   Past Medical History:  Diagnosis Date  . Acute embolism and thombos unsp deep vn unsp lower extremity ([place])   . Acute respiratory failure ([place])   . Allergy   . Anemia   . Aneurysm of unspecified site ([place])   . ARF (acute respiratory failure) ([place])    H/O  . Bronchitis   . CHF (congestive heart  failure) ([place])   . COPD (chronic obstructive pulmonary disease) ([place])   . Cough   . Epistaxis   . GERD (gastroesophageal reflux disease)   . Gout   . Hyperlipidemia   . Hypertension   . Hypokalemia   . Insomnia   . Muscle contracture    MUSCLE SPASMS, muscle weakness  . Peripheral vascular disease ([place])   . Pneumonia   . Pressure ulcer     Social History   Socioeconomic History  . Marital status: Legally Separated    Spouse name: Not on file  . Number of children: Not on file  . Years of education: Not on file  . Highest education level: Not on file  Occupational History  . Not on file  Tobacco Use  . Smoking status: Former Smoker    Packs/day: 1.00    Years: 44.00    Pack years: 44.00    Types: Cigarettes    Quit date: 11/17/2012    Years since quitting: 7.1  . Smokeless tobacco: Never Used  Substance and Sexual Activity  . Alcohol use: No  . Drug use: No  . Sexual activity: Never  Other Topics Concern  . Not  on file  Social History Narrative  . Not on file   Social Determinants of Health   Financial Resource Strain:   . Difficulty of Paying Living Expenses:   Food Insecurity:   . Worried About Charity fundraiser in the Last Year:   . Arboriculturist in the Last Year:   Transportation Needs:   . Film/video editor (Medical):   [name] Lack of Transportation (Non-Medical):   Physical Activity:   . Days of Exercise per Week:   . Minutes of Exercise per Session:   Stress:   . Feeling of Stress :   Social Connections:   . Frequency of Communication with Friends and Family:   . Frequency of Social Gatherings with Friends and Family:   . Attends Religious Services:   . Active Member of Clubs or Organizations:   . Attends Archivist Meetings:   [name] Marital Status:   Intimate Partner Violence:   . Fear of Current or Ex-Partner:   . Emotionally Abused:   [name] Physically Abused:   . Sexually Abused:     Past Surgical History:  Procedure Laterality  Date  . AMPUTATION Left 09/19/2015   Procedure: AMPUTATION BELOW KNEE;  Surgeon: Algernon Huxley, MD;  Location: [facility] ORS;  Service: Vascular;  Laterality: Left;  . AMPUTATION Left 11/01/2015   Procedure: AMPUTATION ABOVE KNEE;  Surgeon: Algernon Huxley, MD;  Location: [facility];  Service: Vascular;  Laterality: Left;  . AMPUTATION Left 09/29/2019   Procedure: IRRIGATION AND DEBRIDEMENT OF LEFT AKA;  Surgeon: Algernon Huxley, MD;  Location: [facility];  Service: General;  Laterality: Left;  . APPLICATION OF WOUND VAC Left 10/18/2015   Procedure: APPLICATION OF WOUND VAC;  Surgeon: Algernon Huxley, MD;  Location: [facility];  Service: Vascular;  Laterality: Left;  . APPLICATION OF WOUND VAC Left 09/30/2019   Procedure: APPLICATION OF WOUND VAC;  Surgeon: Katha Cabal, MD;  Location: [facility];  Service: Vascular;  Laterality: Left;  serial # [id number]  . CATARACT EXTRACTION W/PHACO Right 08/18/2019   Procedure: CATARACT EXTRACTION PHACO AND INTRAOCULAR LENS PLACEMENT ([place]) RIGHT;  Surgeon: Birder Robson, MD;  Location: [facility];  Service: Ophthalmology;  Laterality: Right;  [place] 03:29.0 CDE 45.68 Fluid Pack Lot # [id number]  . CENTRAL VENOUS CATHETER INSERTION Right 09/30/2019   Procedure: INSERTION CENTRAL LINE ADULT;  Surgeon: Katha Cabal, MD;  Location: [facility];  Service: Vascular;  Laterality: Right;  . COLONOSCOPY WITH PROPOFOL N/A 05/25/2017   Procedure: COLONOSCOPY WITH PROPOFOL;  Surgeon: Jonathon Bellows, MD;  Location: [facility] ENDOSCOPY;  Service: Gastroenterology;  Laterality: N/A;  . COLONOSCOPY WITH PROPOFOL N/A 10/14/2017   Procedure: COLONOSCOPY WITH PROPOFOL;  Surgeon: Jonathon Bellows, MD;  Location: [facility] ENDOSCOPY;  Service: Gastroenterology;  Laterality: N/A;  . ESOPHAGOGASTRODUODENOSCOPY (EGD) WITH PROPOFOL N/A 05/25/2017   Procedure: ESOPHAGOGASTRODUODENOSCOPY (EGD) WITH PROPOFOL;  Surgeon: Jonathon Bellows, MD;  Location: [facility] ENDOSCOPY;  Service: Gastroenterology;  Laterality: N/A;  .  ESOPHAGOGASTRODUODENOSCOPY (EGD) WITH PROPOFOL N/A 10/14/2017   Procedure: ESOPHAGOGASTRODUODENOSCOPY (EGD) WITH PROPOFOL;  Surgeon: Jonathon Bellows, MD;  Location: [facility] ENDOSCOPY;  Service: Gastroenterology;  Laterality: N/A;  . EYE SURGERY    . GIVENS CAPSULE STUDY N/A 07/14/2017   Procedure: GIVENS CAPSULE STUDY;  Surgeon: Jonathon Bellows, MD;  Location[facility];  Service: Gastroenterology;  Laterality: N/A;  . GIVENS CAPSULE STUDY N/A 10/30/2017   Procedure: GIVENS CAPSULE STUDY 12 HR;  Surgeon: Jonathon Bellows, MD;  Location: [facility] ENDOSCOPY;  Service: Gastroenterology;  Laterality: N/A;  . IVC FILTER INSERTION N/A 10/12/2017   Procedure: IVC FILTER INSERTION;  Surgeon: Algernon Huxley, MD;  Location: [place] CV LAB;  Service: Cardiovascular;  Laterality: N/A;  . LOWER EXTREMITY ANGIOGRAPHY Right 10/04/2018   Procedure: LOWER EXTREMITY ANGIOGRAPHY;  Surgeon: Algernon Huxley, MD;  Location: [place] CV LAB;  Service: Cardiovascular;  Laterality: Right;  . PERIPHERAL VASCULAR CATHETERIZATION Left 01/18/2015   Procedure: Lower Extremity Angiography;  Surgeon: Algernon Huxley, MD;  Location: [place] CV LAB;  Service: Cardiovascular;  Laterality: Left;  . PERIPHERAL VASCULAR CATHETERIZATION N/A 01/18/2015   Procedure: Lower Extremity Intervention;  Surgeon: Algernon Huxley, MD;  Location: [place] CV LAB;  Service: Cardiovascular;  Laterality: N/A;  . PERIPHERAL VASCULAR CATHETERIZATION  07/30/2015   Procedure: Lower Extremity Intervention;  Surgeon: Algernon Huxley, MD;  Location: [place] CV LAB;  Service: Cardiovascular;;  . PERIPHERAL VASCULAR CATHETERIZATION N/A 07/30/2015   Procedure: Abdominal Aortogram w/Lower Extremity;  Surgeon: Algernon Huxley, MD;  Location: [place] CV LAB;  Service: Cardiovascular;  Laterality: N/A;  . PERIPHERAL VASCULAR CATHETERIZATION Left 08/22/2015   Procedure: Lower Extremity Angiography;  Surgeon: Algernon Huxley, MD;  Location: [place] CV LAB;  Service:  Cardiovascular;  Laterality: Left;  . PERIPHERAL VASCULAR CATHETERIZATION Left 08/22/2015   Procedure: Lower Extremity Intervention;  Surgeon: Algernon Huxley, MD;  Location: [place] CV LAB;  Service: Cardiovascular;  Laterality: Left;  . PERIPHERAL VASCULAR CATHETERIZATION Right 03/31/2016   Procedure: Lower Extremity Angiography;  Surgeon: Algernon Huxley, MD;  Location: [place] CV LAB;  Service: Cardiovascular;  Laterality: Right;  . PERIPHERAL VASCULAR CATHETERIZATION  03/31/2016   Procedure: Lower Extremity Intervention;  Surgeon: Algernon Huxley, MD;  Location: [place] CV LAB;  Service: Cardiovascular;;  . PERIPHERAL VASCULAR CATHETERIZATION Left 04/10/2016   Procedure: Lower Extremity Angiography;  Surgeon: Algernon Huxley, MD;  Location: [place] CV LAB;  Service: Cardiovascular;  Laterality: Left;  [name] VACUUM ASSISTED CLOSURE CHANGE Left 10/03/2019   Procedure: LEFT THIGH VACUUM ASSISTED CLOSURE CHANGE;  Surgeon: Algernon Huxley, MD;  Location: [facility] ORS;  Service: General;  Laterality: Left;  . WOUND DEBRIDEMENT Left 10/18/2015   Procedure: DEBRIDEMENT WOUND   ( LEFT BKA DEBRIDEMENT );  Surgeon: Algernon Huxley, MD;  Location: [facility] ORS;  Service: Vascular;  Laterality: Left;    Family History  Problem Relation Age of Onset  . Heart attack Mother   . Varicose Veins Neg Hx     No Known Allergies     Assessment & Plan:   1. Abscess of left AKA stump This is completely resolved.  No further intervention necessary.  Nursing facility is advised to contact our office if it begins to show signs symptoms of infection such as swelling or purulent drainage.  2. PAD (peripheral artery disease) (HCC)  Recommend:  The patient has evidence of atherosclerosis of the lower extremities with claudication.  The patient does not voice lifestyle limiting changes at this point in time.  Noninvasive studies do not suggest clinically significant change.  No invasive studies, angiography or surgery at this  time The patient should continue walking and begin a more formal exercise program.  The patient should continue antiplatelet therapy and aggressive treatment of the lipid abnormalities  No changes in the patient's medications at this time  The patient should continue wearing graduated compression socks 10-15 mmHg strength to control the mild edema.   Patient returns  in 6 months for noninvasive studies   3. Hyperlipidemia, unspecified hyperlipidemia type Continue statin as ordered and reviewed, no changes at this time   4. Essential hypertension Continue antihypertensive medications as already ordered, these medications have been reviewed and there are no changes at this time.    Current Outpatient Medications on File Prior to Visit  Medication Sig Dispense Refill  . acetaminophen (TYLENOL) 325 MG tablet Take 650 mg by mouth every 4 (four) hours as needed for fever.     [name] acidophilus (RISAQUAD) CAPS capsule Take 1 capsule by mouth 2 (two) times daily.    [name] ammonium lactate (AMLACTIN) 12 % cream Apply topically 2 (two) times daily. Apply to right foot    . aspirin EC 81 MG tablet Take 1 tablet (81 mg total) by mouth daily. 150 tablet 2  . baclofen (LIORESAL) 10 MG tablet Take 10 mg by mouth 3 (three) times daily. Hold for sedation    . brimonidine-timolol (COMBIGAN) 0.2-0.5 % ophthalmic solution Place 1 drop into the right eye 2 (two) times daily.    . ciclopirox (PENLAC) 8 % solution Apply 1 application topically daily. Apply solutions to toenails    . Difluprednate (DUREZOL) 0.05 % EMUL Place 1 drop into the right eye 3 (three) times daily.     . Difluprednate (DUREZOL) 0.05 % EMUL Apply 1 drop to eye daily.    [name] docusate sodium (COLACE) 100 MG capsule Take 100 mg by mouth daily as needed for mild constipation.    [name] ELIQUIS 5 MG TABS tablet Take 5 mg by mouth 2 (two) times daily.     . fentaNYL (DURAGESIC) 25 MCG/HR     . ferrous sulfate 324 (65 Fe) MG TBEC Take 324 mg by mouth 2  (two) times daily.     . furosemide (LASIX) 40 MG tablet Take 1 tablet (40 mg total) by mouth daily. 30 tablet   . gabapentin (NEURONTIN) 100 MG capsule Take 100 mg by mouth at bedtime.     [name] guaiFENesin (ROBITUSSIN) 100 MG/5ML SOLN Take 15 mLs by mouth 3 (three) times daily as needed for cough (congestion).     [name] loperamide (IMODIUM) 2 MG capsule Take 2 mg by mouth 3 (three) times daily as needed for diarrhea or loose stools.    . Melatonin 5 MG TABS Take 5 mg by mouth at bedtime as needed (insomnia).     . Morphine Sulfate (MORPHINE CONCENTRATE) 10 mg / 0.5 ml concentrated solution     . nitroGLYCERIN (NITROSTAT) 0.4 MG SL tablet Place 0.14 mg under the tongue every 5 (five) minutes as needed for chest pain.    . Omega-3 Fatty Acids (FISH OIL) 1000 MG CAPS Take 1,000 mg by mouth daily.     . Oxycodone HCl 10 MG TABS Take by mouth.    [name] oxymetazoline (AFRIN) 0.05 % nasal spray Place 2 sprays into both nostrils 2 (two) times daily as needed (For nose bleeds).     . potassium chloride SA (K-DUR,KLOR-CON) 20 MEQ tablet Take 20 mEq by mouth daily. With or after meal    . sodium chloride (OCEAN) 0.65 % SOLN nasal spray Place 2 sprays into both nostrils 2 (two) times daily as needed for congestion.    . tamsulosin (FLOMAX) 0.4 MG CAPS capsule Take 1 capsule (0.4 mg total) by mouth daily. 30 capsule 0  . TRELEGY ELLIPTA 100-62.5-25 MCG/INH AEPB Inhale 1 puff into the lungs every morning.     . triamcinolone  cream (KENALOG) 0.1 % Apply 1 application topically 2 (two) times daily.    [name] atorvastatin (LIPITOR) 10 MG tablet Take 1 tablet (10 mg total) by mouth daily. (Patient taking differently: Take 10 mg by mouth at bedtime. ) 30 tablet 11   No current facility-administered medications on file prior to visit.    There are no Patient Instructions on file for this visit. No follow-ups on file.   Kris Hartmann, NP

## 2020-03-18 IMAGING — DX PORTABLE CHEST - 1 VIEW
1 series · 1 of 1 positions shown · non-contrast
Comparison: 12/27/2018

CLINICAL DATA: Acute respiratory disease due to [id number]

EXAM:
PORTABLE CHEST 1 VIEW

[chest]
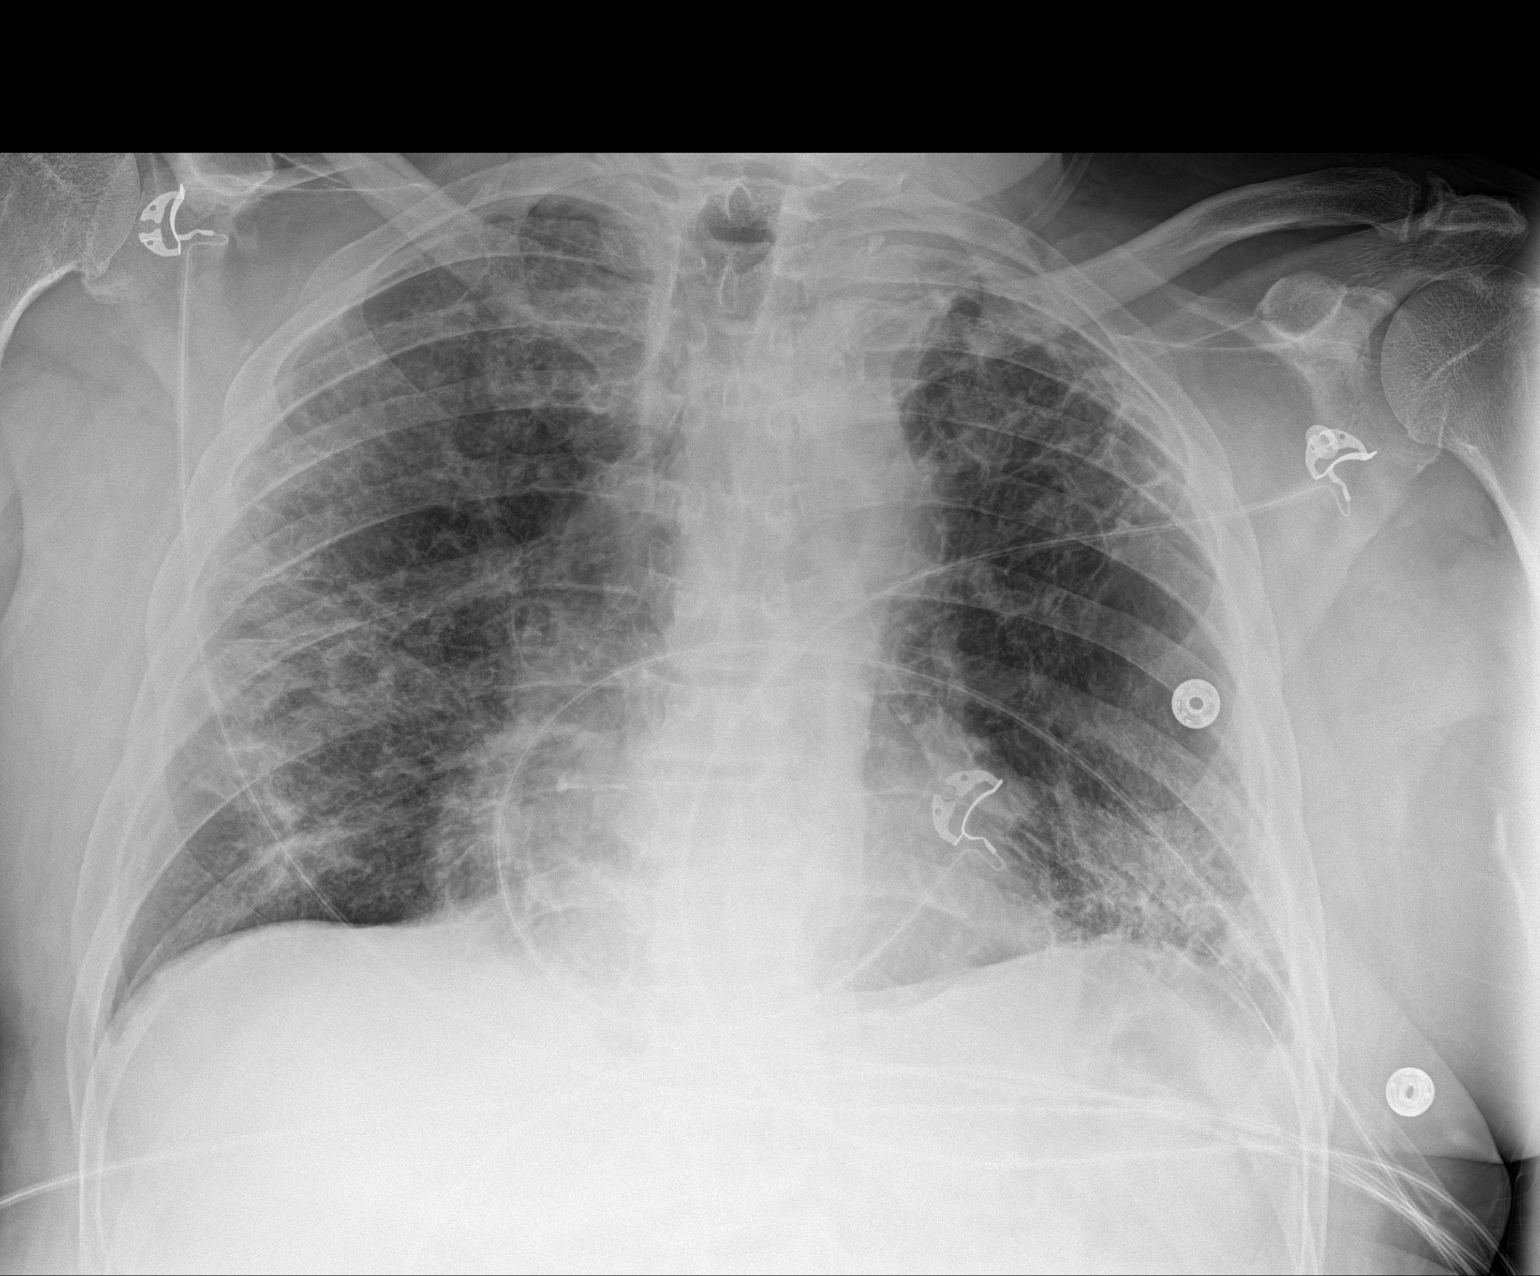

[1 of 1 positions shown; findings below may reference images not displayed]

FINDINGS: Diffuse bilateral airspace disease with mild interval improvement.
Chronic scarring left upper lobe. No significant effusion. Heart
size normal.
IMPRESSION: Bilateral airspace disease with mild interval improvement.

## 2020-04-26 ENCOUNTER — Other Ambulatory Visit: Payer: Self-pay

## 2020-04-26 ENCOUNTER — Emergency Department: Payer: Medicare Other

## 2020-04-26 ENCOUNTER — Encounter: Payer: Self-pay | Admitting: Emergency Medicine

## 2020-04-26 DIAGNOSIS — D72829 Elevated white blood cell count, unspecified: Secondary | ICD-10-CM | POA: Diagnosis not present

## 2020-04-26 DIAGNOSIS — Z89612 Acquired absence of left leg above knee: Secondary | ICD-10-CM

## 2020-04-26 DIAGNOSIS — I110 Hypertensive heart disease with heart failure: Secondary | ICD-10-CM | POA: Diagnosis not present

## 2020-04-26 DIAGNOSIS — I82502 Chronic embolism and thrombosis of unspecified deep veins of left lower extremity: Secondary | ICD-10-CM | POA: Diagnosis not present

## 2020-04-26 DIAGNOSIS — Z7901 Long term (current) use of anticoagulants: Secondary | ICD-10-CM | POA: Diagnosis not present

## 2020-04-26 DIAGNOSIS — I5032 Chronic diastolic (congestive) heart failure: Secondary | ICD-10-CM | POA: Diagnosis present

## 2020-04-26 DIAGNOSIS — K2211 Ulcer of esophagus with bleeding: Secondary | ICD-10-CM | POA: Diagnosis not present

## 2020-04-26 DIAGNOSIS — J449 Chronic obstructive pulmonary disease, unspecified: Secondary | ICD-10-CM | POA: Diagnosis present

## 2020-04-26 DIAGNOSIS — E785 Hyperlipidemia, unspecified: Secondary | ICD-10-CM | POA: Diagnosis present

## 2020-04-26 DIAGNOSIS — Z8249 Family history of ischemic heart disease and other diseases of the circulatory system: Secondary | ICD-10-CM

## 2020-04-26 DIAGNOSIS — Z20822 Contact with and (suspected) exposure to covid-19: Secondary | ICD-10-CM | POA: Diagnosis present

## 2020-04-26 DIAGNOSIS — K449 Diaphragmatic hernia without obstruction or gangrene: Secondary | ICD-10-CM | POA: Diagnosis not present

## 2020-04-26 DIAGNOSIS — F1010 Alcohol abuse, uncomplicated: Secondary | ICD-10-CM | POA: Diagnosis present

## 2020-04-26 DIAGNOSIS — K920 Hematemesis: Secondary | ICD-10-CM | POA: Diagnosis not present

## 2020-04-26 DIAGNOSIS — E870 Hyperosmolality and hypernatremia: Secondary | ICD-10-CM | POA: Diagnosis present

## 2020-04-26 DIAGNOSIS — Z87891 Personal history of nicotine dependence: Secondary | ICD-10-CM | POA: Diagnosis not present

## 2020-04-26 DIAGNOSIS — I739 Peripheral vascular disease, unspecified: Secondary | ICD-10-CM | POA: Diagnosis present

## 2020-04-26 LAB — CBC WITH DIFFERENTIAL/PLATELET
Abs Immature Granulocytes: 0.15 10*3/uL — ABNORMAL HIGH (ref 0.00–0.07)
Basophils Absolute: 0.1 10*3/uL (ref 0.0–0.1)
Basophils Relative: 1 %
Eosinophils Absolute: 0.2 10*3/uL (ref 0.0–0.5)
Eosinophils Relative: 1 %
HCT: 48.1 % (ref 39.0–52.0)
Hemoglobin: 16.2 g/dL (ref 13.0–17.0)
Immature Granulocytes: 1 %
Lymphocytes Relative: 9 %
Lymphs Abs: 2.1 10*3/uL (ref 0.7–4.0)
MCH: 27.7 pg (ref 26.0–34.0)
MCHC: 33.7 g/dL (ref 30.0–36.0)
MCV: 82.2 fL (ref 80.0–100.0)
Monocytes Absolute: 1.2 10*3/uL — ABNORMAL HIGH (ref 0.1–1.0)
Monocytes Relative: 5 %
Neutro Abs: 19.9 10*3/uL — ABNORMAL HIGH (ref 1.7–7.7)
Neutrophils Relative %: 83 %
Platelets: 279 10*3/uL (ref 150–400)
RBC: 5.85 MIL/uL — ABNORMAL HIGH (ref 4.22–5.81)
RDW: 17.1 % — ABNORMAL HIGH (ref 11.5–15.5)
WBC: 23.5 10*3/uL — ABNORMAL HIGH (ref 4.0–10.5)
nRBC: 0 % (ref 0.0–0.2)

## 2020-04-26 LAB — COMPREHENSIVE METABOLIC PANEL
ALT: 22 U/L (ref 0–44)
AST: 23 U/L (ref 15–41)
Albumin: 4.6 g/dL (ref 3.5–5.0)
Alkaline Phosphatase: 113 U/L (ref 38–126)
Anion gap: 11 (ref 5–15)
BUN: 21 mg/dL (ref 8–23)
CO2: 26 mmol/L (ref 22–32)
Calcium: 8.9 mg/dL (ref 8.9–10.3)
Chloride: 103 mmol/L (ref 98–111)
Creatinine, Ser: 1.14 mg/dL (ref 0.61–1.24)
GFR calc Af Amer: >60 mL/min (ref >60)
GFR calc non Af Amer: >60 mL/min (ref >60)
Glucose, Bld: 136 mg/dL — ABNORMAL HIGH (ref 70–99)
Potassium: 3.8 mmol/L (ref 3.5–5.1)
Sodium: 140 mmol/L (ref 135–145)
Total Bilirubin: 1.5 mg/dL — ABNORMAL HIGH (ref 0.3–1.2)
Total Protein: 8.2 g/dL — ABNORMAL HIGH (ref 6.5–8.1)

## 2020-04-26 LAB — TYPE AND SCREEN
ABO/RH(D): A POS
Antibody Screen: NEGATIVE

## 2020-04-26 LAB — TROPONIN I (HIGH SENSITIVITY): Troponin I (High Sensitivity): 9 ng/L (ref <18)

## 2020-04-26 LAB — LIPASE, BLOOD: Lipase: 21 U/L (ref 11–51)

## 2020-04-26 NOTE — ED Triage Notes (Signed)
EMS brought pt in from [facility]; 3 epsidoes of vomiting PTA; 25mg  IM phenergan admin PTA

## 2020-04-26 NOTE — ED Triage Notes (Signed)
Pt arrived VIA EMS from [facility] with c/o vomiting blood and esophogeal pain when swallowing or vomiting. Denies similar symptoms in the past. bright Red bloody vomit noted in emesis bag in triage. Pt states symptoms have seemed to worsen since arrival to ED.

## 2020-04-27 ENCOUNTER — Other Ambulatory Visit: Payer: Self-pay

## 2020-04-27 ENCOUNTER — Observation Stay: Payer: Medicare Other | Admitting: Anesthesiology

## 2020-04-27 ENCOUNTER — Emergency Department: Payer: Medicare Other

## 2020-04-27 ENCOUNTER — Encounter
Admission: EM | Disposition: A | Discharge status: Skilled Nursing Facility | Payer: Medicaid Other | Source: Skilled Nursing Facility | Attending: Internal Medicine

## 2020-04-27 ENCOUNTER — Inpatient Hospital Stay
Admission: EM | Admit: 2020-04-27 | Discharge: 2020-04-30 | DRG: 381 | Disposition: A | Discharge status: Skilled Nursing Facility | Payer: Medicare Other | Source: Skilled Nursing Facility | Attending: Internal Medicine | Admitting: Internal Medicine

## 2020-04-27 DIAGNOSIS — D72829 Elevated white blood cell count, unspecified: Secondary | ICD-10-CM | POA: Diagnosis present

## 2020-04-27 DIAGNOSIS — J4489 Other specified chronic obstructive pulmonary disease: Secondary | ICD-10-CM | POA: Diagnosis present

## 2020-04-27 DIAGNOSIS — Z7289 Other problems related to lifestyle: Secondary | ICD-10-CM | POA: Diagnosis not present

## 2020-04-27 DIAGNOSIS — T18128A Food in esophagus causing other injury, initial encounter: Secondary | ICD-10-CM

## 2020-04-27 DIAGNOSIS — J449 Chronic obstructive pulmonary disease, unspecified: Secondary | ICD-10-CM

## 2020-04-27 DIAGNOSIS — F1090 Alcohol use, unspecified, uncomplicated: Secondary | ICD-10-CM

## 2020-04-27 DIAGNOSIS — I5032 Chronic diastolic (congestive) heart failure: Secondary | ICD-10-CM

## 2020-04-27 DIAGNOSIS — K209 Esophagitis, unspecified without bleeding: Secondary | ICD-10-CM | POA: Diagnosis not present

## 2020-04-27 DIAGNOSIS — Z789 Other specified health status: Secondary | ICD-10-CM

## 2020-04-27 DIAGNOSIS — K2211 Ulcer of esophagus with bleeding: Secondary | ICD-10-CM | POA: Diagnosis not present

## 2020-04-27 DIAGNOSIS — K920 Hematemesis: Secondary | ICD-10-CM | POA: Diagnosis not present

## 2020-04-27 DIAGNOSIS — E785 Hyperlipidemia, unspecified: Secondary | ICD-10-CM | POA: Diagnosis present

## 2020-04-27 DIAGNOSIS — I82512 Chronic embolism and thrombosis of left femoral vein: Secondary | ICD-10-CM

## 2020-04-27 DIAGNOSIS — I82502 Chronic embolism and thrombosis of unspecified deep veins of left lower extremity: Secondary | ICD-10-CM | POA: Diagnosis present

## 2020-04-27 DIAGNOSIS — I10 Essential (primary) hypertension: Secondary | ICD-10-CM | POA: Diagnosis present

## 2020-04-27 HISTORY — PX: ESOPHAGOGASTRODUODENOSCOPY (EGD) WITH PROPOFOL: SHX5813

## 2020-04-27 HISTORY — DX: Hematemesis: K92.0

## 2020-04-27 LAB — PROTIME-INR
INR: 1.2 (ref 0.8–1.2)
Prothrombin Time: 14.8 seconds (ref 11.4–15.2)

## 2020-04-27 LAB — CBC
HCT: 40.1 % (ref 39.0–52.0)
HCT: 33.7 % — ABNORMAL LOW (ref 39.0–52.0)
Hemoglobin: 12.6 g/dL — ABNORMAL LOW (ref 13.0–17.0)
Hemoglobin: 11 g/dL — ABNORMAL LOW (ref 13.0–17.0)
MCH: 27.6 pg (ref 26.0–34.0)
MCH: 27.7 pg (ref 26.0–34.0)
MCHC: 31.4 g/dL (ref 30.0–36.0)
MCHC: 32.6 g/dL (ref 30.0–36.0)
MCV: 87.9 fL (ref 80.0–100.0)
MCV: 84.9 fL (ref 80.0–100.0)
Platelets: 238 10*3/uL (ref 150–400)
Platelets: 209 10*3/uL (ref 150–400)
RBC: 4.56 MIL/uL (ref 4.22–5.81)
RBC: 3.97 MIL/uL — ABNORMAL LOW (ref 4.22–5.81)
RDW: 16.5 % — ABNORMAL HIGH (ref 11.5–15.5)
RDW: 16.4 % — ABNORMAL HIGH (ref 11.5–15.5)
WBC: 21.8 10*3/uL — ABNORMAL HIGH (ref 4.0–10.5)
WBC: 19.4 10*3/uL — ABNORMAL HIGH (ref 4.0–10.5)
nRBC: 0 % (ref 0.0–0.2)
nRBC: 0 % (ref 0.0–0.2)

## 2020-04-27 LAB — LACTIC ACID, PLASMA
Lactic Acid, Venous: 1.8 mmol/L (ref 0.5–1.9)
Lactic Acid, Venous: 2 mmol/L (ref 0.5–1.9)

## 2020-04-27 LAB — PROCALCITONIN: Procalcitonin: 0.26 ng/mL

## 2020-04-27 LAB — SARS CORONAVIRUS 2 BY RT PCR (HOSPITAL ORDER, PERFORMED IN ~~LOC~~ HOSPITAL LAB): SARS Coronavirus 2: NEGATIVE

## 2020-04-27 LAB — APTT: aPTT: 30 seconds (ref 24–36)

## 2020-04-27 LAB — ETHANOL: Alcohol, Ethyl (B): <10 mg/dL (ref <10)

## 2020-04-27 LAB — TROPONIN I (HIGH SENSITIVITY): Troponin I (High Sensitivity): 12 ng/L (ref <18)

## 2020-04-27 LAB — BRAIN NATRIURETIC PEPTIDE: B Natriuretic Peptide: 67.5 pg/mL (ref 0.0–100.0)

## 2020-04-27 SURGERY — ESOPHAGOGASTRODUODENOSCOPY (EGD) WITH PROPOFOL
Anesthesia: General

## 2020-04-27 MED ORDER — ACETAMINOPHEN 325 MG PO TABS: 650.0000 mg | ORAL_TABLET | Freq: Four times a day (QID) | ORAL | Status: DC | PRN

## 2020-04-27 MED ORDER — LORAZEPAM 2 MG/ML IJ SOLN
0.0000 mg | Freq: Four times a day (QID) | INTRAMUSCULAR | Status: AC
  Administered 2020-04-27: 0 mg via INTRAVENOUS

## 2020-04-27 MED ORDER — ALBUTEROL SULFATE (2.5 MG/3ML) 0.083% IN NEBU: 2.5000 mg | INHALATION_SOLUTION | RESPIRATORY_TRACT | Status: DC | PRN

## 2020-04-27 MED ORDER — PIPERACILLIN-TAZOBACTAM 3.375 G IVPB 30 MIN
3.3750 g | Freq: Once | INTRAVENOUS | Status: AC
  Administered 2020-04-27: 3.375 g via INTRAVENOUS
  Filled 2020-04-27: qty 50

## 2020-04-27 MED ORDER — PROPOFOL 10 MG/ML IV BOLUS
INTRAVENOUS | Status: DC | PRN
  Administered 2020-04-27: 100 mg via INTRAVENOUS
  Administered 2020-04-27: 50 mg via INTRAVENOUS
  Administered 2020-04-27: 120 mg via INTRAVENOUS

## 2020-04-27 MED ORDER — SODIUM CHLORIDE 0.9 % IV SOLN
8.0000 mg/h | INTRAVENOUS | Status: AC
  Administered 2020-04-27 – 2020-04-29 (×5): 8 mg/h via INTRAVENOUS
  Filled 2020-04-27 (×8): qty 80

## 2020-04-27 MED ORDER — FLUTICASONE-UMECLIDIN-VILANT 100-62.5-25 MCG/INH IN AEPB: 1.0000 | INHALATION_SPRAY | Freq: Every morning | RESPIRATORY_TRACT | Status: DC

## 2020-04-27 MED ORDER — ATORVASTATIN CALCIUM 10 MG PO TABS
10.0000 mg | ORAL_TABLET | Freq: Every day | ORAL | Status: DC
  Administered 2020-04-28 – 2020-04-29 (×2): 10 mg via ORAL
  Filled 2020-04-27 (×3): qty 1

## 2020-04-27 MED ORDER — METOCLOPRAMIDE HCL 5 MG/ML IJ SOLN
10.0000 mg | Freq: Four times a day (QID) | INTRAMUSCULAR | Status: DC | PRN
  Administered 2020-04-27: 10 mg via INTRAVENOUS
  Filled 2020-04-27: qty 2

## 2020-04-27 MED ORDER — OMEGA-3-ACID ETHYL ESTERS 1 G PO CAPS
1.0000 g | ORAL_CAPSULE | Freq: Every day | ORAL | Status: DC
  Administered 2020-04-29 – 2020-04-30 (×2): 1 g via ORAL
  Filled 2020-04-27 (×3): qty 1

## 2020-04-27 MED ORDER — BACLOFEN 10 MG PO TABS
10.0000 mg | ORAL_TABLET | Freq: Three times a day (TID) | ORAL | Status: DC
  Administered 2020-04-28 – 2020-04-30 (×6): 10 mg via ORAL
  Filled 2020-04-27 (×11): qty 1

## 2020-04-27 MED ORDER — ASCORBIC ACID 500 MG PO TABS
500.0000 mg | ORAL_TABLET | Freq: Two times a day (BID) | ORAL | Status: DC
  Administered 2020-04-28 – 2020-04-30 (×4): 500 mg via ORAL
  Filled 2020-04-27 (×5): qty 1

## 2020-04-27 MED ORDER — SODIUM CHLORIDE 0.9 % IV BOLUS
1000.0000 mL | Freq: Once | INTRAVENOUS | Status: AC
  Administered 2020-04-27: 1000 mL via INTRAVENOUS

## 2020-04-27 MED ORDER — GABAPENTIN 100 MG PO CAPS
100.0000 mg | ORAL_CAPSULE | Freq: Every day | ORAL | Status: DC
  Administered 2020-04-28: 100 mg via ORAL
  Filled 2020-04-27 (×2): qty 1

## 2020-04-27 MED ORDER — ONDANSETRON HCL 4 MG/2ML IJ SOLN
4.0000 mg | Freq: Once | INTRAMUSCULAR | Status: AC
  Administered 2020-04-27: 4 mg via INTRAVENOUS
  Filled 2020-04-27: qty 2

## 2020-04-27 MED ORDER — HYDRALAZINE HCL 20 MG/ML IJ SOLN: 5.0000 mg | INTRAMUSCULAR | Status: DC | PRN

## 2020-04-27 MED ORDER — ACETAMINOPHEN 650 MG RE SUPP: 650.0000 mg | Freq: Four times a day (QID) | RECTAL | Status: DC | PRN

## 2020-04-27 MED ORDER — MELATONIN 5 MG PO TABS
5.0000 mg | ORAL_TABLET | Freq: Every evening | ORAL | Status: DC | PRN
  Administered 2020-04-28 – 2020-04-29 (×2): 5 mg via ORAL
  Filled 2020-04-27 (×3): qty 1

## 2020-04-27 MED ORDER — METOCLOPRAMIDE HCL 5 MG/ML IJ SOLN
INTRAMUSCULAR | Status: AC
  Administered 2020-04-27: 10 mg via INTRAVENOUS
  Filled 2020-04-27: qty 2

## 2020-04-27 MED ORDER — UMECLIDINIUM BROMIDE 62.5 MCG/INH IN AEPB
1.0000 | INHALATION_SPRAY | Freq: Every day | RESPIRATORY_TRACT | Status: DC
  Administered 2020-04-28 – 2020-04-30 (×3): 1 via RESPIRATORY_TRACT
  Filled 2020-04-27: qty 7

## 2020-04-27 MED ORDER — SUCCINYLCHOLINE CHLORIDE 20 MG/ML IJ SOLN
INTRAMUSCULAR | Status: DC | PRN
  Administered 2020-04-27: 100 mg via INTRAVENOUS

## 2020-04-27 MED ORDER — SODIUM CHLORIDE 0.9 % IV SOLN: INTRAVENOUS | Status: DC

## 2020-04-27 MED ORDER — FLUTICASONE FUROATE-VILANTEROL 100-25 MCG/INH IN AEPB
1.0000 | INHALATION_SPRAY | Freq: Every day | RESPIRATORY_TRACT | Status: DC
  Administered 2020-04-28 – 2020-04-30 (×3): 1 via RESPIRATORY_TRACT
  Filled 2020-04-27: qty 28

## 2020-04-27 MED ORDER — ONDANSETRON HCL 4 MG/2ML IJ SOLN: 4.0000 mg | Freq: Three times a day (TID) | INTRAMUSCULAR | Status: DC | PRN

## 2020-04-27 MED ORDER — LIDOCAINE HCL (CARDIAC) PF 100 MG/5ML IV SOSY
PREFILLED_SYRINGE | INTRAVENOUS | Status: DC | PRN
  Administered 2020-04-27: 100 mg via INTRAVENOUS

## 2020-04-27 MED ORDER — NITROGLYCERIN 0.4 MG SL SUBL: 0.4000 mg | SUBLINGUAL_TABLET | SUBLINGUAL | Status: DC | PRN

## 2020-04-27 MED ORDER — FUROSEMIDE 40 MG PO TABS: 40.0000 mg | ORAL_TABLET | Freq: Every day | ORAL | Status: DC

## 2020-04-27 MED ORDER — IOHEXOL 300 MG/ML  SOLN
80.0000 mL | Freq: Once | INTRAMUSCULAR | Status: AC | PRN
  Administered 2020-04-27: 80 mL via INTRAVENOUS

## 2020-04-27 MED ORDER — DOCUSATE SODIUM 100 MG PO CAPS
100.0000 mg | ORAL_CAPSULE | Freq: Every day | ORAL | Status: DC | PRN
  Administered 2020-04-30: 100 mg via ORAL
  Filled 2020-04-27: qty 1

## 2020-04-27 MED ORDER — GUAIFENESIN 100 MG/5ML PO SOLN
15.0000 mL | Freq: Three times a day (TID) | ORAL | Status: DC | PRN
  Filled 2020-04-27: qty 15

## 2020-04-27 MED ORDER — OXYCODONE HCL 5 MG PO TABS: 10.0000 mg | ORAL_TABLET | ORAL | Status: DC | PRN

## 2020-04-27 MED ORDER — THIAMINE HCL 100 MG/ML IJ SOLN
100.0000 mg | Freq: Every day | INTRAMUSCULAR | Status: DC
  Administered 2020-04-27 – 2020-04-30 (×4): 100 mg via INTRAVENOUS
  Filled 2020-04-27 (×3): qty 2

## 2020-04-27 MED ORDER — TAMSULOSIN HCL 0.4 MG PO CAPS
0.4000 mg | ORAL_CAPSULE | Freq: Every day | ORAL | Status: DC
  Administered 2020-04-29 – 2020-04-30 (×2): 0.4 mg via ORAL
  Filled 2020-04-27 (×3): qty 1

## 2020-04-27 MED ORDER — RISAQUAD PO CAPS
1.0000 | ORAL_CAPSULE | Freq: Two times a day (BID) | ORAL | Status: DC
  Administered 2020-04-28 – 2020-04-30 (×4): 1 via ORAL
  Filled 2020-04-27 (×5): qty 1

## 2020-04-27 MED ORDER — LOPERAMIDE HCL 2 MG PO CAPS: 2.0000 mg | ORAL_CAPSULE | Freq: Three times a day (TID) | ORAL | Status: DC | PRN

## 2020-04-27 MED ORDER — FENTANYL 25 MCG/HR TD PT72
1.0000 | MEDICATED_PATCH | TRANSDERMAL | Status: DC
  Administered 2020-04-28: 1 via TRANSDERMAL
  Filled 2020-04-27: qty 1

## 2020-04-27 MED ORDER — SODIUM CHLORIDE 0.9 % IV SOLN
80.0000 mg | Freq: Once | INTRAVENOUS | Status: AC
  Administered 2020-04-27: 80 mg via INTRAVENOUS
  Filled 2020-04-27: qty 80

## 2020-04-27 MED ORDER — PHENYLEPHRINE HCL (PRESSORS) 10 MG/ML IV SOLN
INTRAVENOUS | Status: DC | PRN
  Administered 2020-04-27 (×3): 100 ug via INTRAVENOUS

## 2020-04-27 MED ORDER — FERROUS SULFATE 325 (65 FE) MG PO TABS
324.0000 mg | ORAL_TABLET | Freq: Two times a day (BID) | ORAL | Status: DC
  Administered 2020-04-28 – 2020-04-29 (×3): 324 mg via ORAL
  Filled 2020-04-27 (×5): qty 1

## 2020-04-27 MED ORDER — MORPHINE SULFATE (PF) 2 MG/ML IV SOLN: 1.0000 mg | INTRAVENOUS | Status: DC | PRN

## 2020-04-27 NOTE — Op Note (Addendum)
[facility] Gastroenterology Patient Name: Timothy Juarez Procedure Date: 04/27/2020 11:20 AM MRN: [id number] Account #: [id number] Date of Birth: [birth year] Admit Type: Outpatient Age: 69 Room: [facility] ENDO ROOM 2 Gender: Male Note Status: Finalized Procedure:             Upper GI endoscopy Indications:           Foreign body in the esophagus, Hematemesis, Abnormal                         CT of the GI tract Providers:             Lin Landsman MD, MD Medicines:             Monitored Anesthesia Care Complications:         No immediate complications. Estimated blood loss:                         Minimal. Procedure:             Pre-Anesthesia Assessment:                        - Prior to the procedure, a History and Physical was                         performed, and patient medications and allergies were                         reviewed. The patient is competent. The risks and                         benefits of the procedure and the sedation options and                         risks were discussed with the patient. All questions                         were answered and informed consent was obtained.                         Patient identification and proposed procedure were                         verified by the physician, the nurse, the                         anesthesiologist, the anesthetist and the technician                         in the pre-procedure area in the procedure room in the                         endoscopy suite. Mental Status Examination: alert and                         oriented. Airway Examination: normal oropharyngeal                         airway and neck mobility. Respiratory Examination:  clear to auscultation. CV Examination: normal.                         Prophylactic Antibiotics: The patient does not require                         prophylactic antibiotics. Prior Anticoagulants: The                          patient has taken Eliquis (apixaban), last dose was 1                         day prior to procedure. ASA Grade Assessment: III - A                         patient with severe systemic disease. After reviewing                         the risks and benefits, the patient was deemed in                         satisfactory condition to undergo the procedure. The                         anesthesia plan was to use monitored anesthesia care                         (MAC). Immediately prior to administration of                         medications, the patient was re-assessed for adequacy                         to receive sedatives. The heart rate, respiratory                         rate, oxygen saturations, blood pressure, adequacy of                         pulmonary ventilation, and response to care were                         monitored throughout the procedure. The physical                         status of the patient was re-assessed after the                         procedure.                        After obtaining informed consent, the endoscope was                         passed under direct vision. Throughout the procedure,                         the patient's blood pressure, pulse, and oxygen  saturations were monitored continuously. The Endoscope                         was introduced through the mouth, and advanced to the                         second part of duodenum. The upper GI endoscopy was                         accomplished without difficulty. The patient tolerated                         the procedure well. Findings:      The duodenal bulb and second portion of the duodenum were normal, no       bleeding source identified in duodenum.      Hematin (altered blood/coffee-ground-like material) was found in the       entire duodenum.      Red blood with clots was found in the entire examined stomach, no       bleeding source identified in stomach.       A small hiatal hernia was present.      Multiple esophageal ulcers extending from distal to mid esophagus, about       2m superficial ulcers in distal esophagus and largest, linear ulcer in       mid esophagus measuring more than 2cm with stigmata of recent bleeding,       oozing and huge overlying blood clot. The clot was not displaced. For       hemostasis, hemostatic spray was deployed as patient is on eliquis. A       single spray was applied. There was no bleeding at the end of the       procedure. Foreign body/food bolus that was seen on CT is infact a large       blood clot adherent to esophageal ulcer Impression:            - Normal duodenal bulb and second portion of the                         duodenum.                        - Blood in the entire examined duodenum.                        - Red blood in the entire stomach.                        - Small hiatal hernia.                        - Esophageal ulcers with stigmata of recent bleeding.                         Hemostatic spray applied.                        - No specimens collected. Recommendation:        - Return patient to hospital ward for ongoing care.                        -  NPO today.                        - Continue present medications.                        - Give Protonix (pantoprazole): 8 mg/hr IV by                         continuous infusion for 3 days.                        - Use a proton pump inhibitor PO BID long term.                        - Repeat upper endoscopy in 8 weeks to check healing.                        - Continue to hold eliquis                        - Strictly avoid hard meats Procedure Code(s):     --- Professional ---                        [phone], Esophagogastroduodenoscopy, flexible,                         transoral; with control of bleeding, any method Diagnosis Code(s):     --- Professional ---                        K92.2, Gastrointestinal hemorrhage, unspecified                         K44.9, Diaphragmatic hernia without obstruction or                         gangrene                        T18.108A, Unspecified foreign body in esophagus                         causing other injury, initial encounter                        K22.11, Ulcer of esophagus with bleeding                        K92.0, Hematemesis                        R93.3, Abnormal findings on diagnostic imaging of                         other parts of digestive tract CPT copyright 2019 American Medical Association. All rights reserved. The codes documented in this report are preliminary and upon coder review may  be revised to meet current compliance requirements. Dr. Ulyess Mort Lin Landsman MD, MD 04/27/2020 12:42:08 PM This report has been signed electronically. Number of Addenda: 0 Note Initiated On: 04/27/2020 11:20 AM Estimated Blood Loss:  Estimated blood loss was minimal.      [facility]

## 2020-04-27 NOTE — ED Notes (Signed)
Pt states that he's not sure why he's here but that he came from [facility] for coughing up blood. When questioned, patient states he has been feeling a little shob.

## 2020-04-27 NOTE — Anesthesia Postprocedure Evaluation (Signed)
Anesthesia Post Note  Patient: LENVILLE HIBBERD  Procedure(s) Performed: ESOPHAGOGASTRODUODENOSCOPY (EGD) WITH PROPOFOL (N/A )  Patient location during evaluation: PACU Anesthesia Type: General Level of consciousness: awake and alert and oriented Pain management: pain level controlled Vital Signs Assessment: post-procedure vital signs reviewed and stable Respiratory status: spontaneous breathing, nonlabored ventilation and respiratory function stable Cardiovascular status: blood pressure returned to baseline and stable Postop Assessment: no signs of nausea or vomiting Anesthetic complications: no   No complications documented.   Last Vitals:  Vitals:   04/27/20 1125 04/27/20 1250  BP: 119/70 121/66  Pulse: (!) 116 (!) 111  Resp: 20 (!) 28  Temp: 36.6 C (!) 36.2 C  SpO2: 95% 98%    Last Pain:  Vitals:   04/27/20 1250  TempSrc:   PainSc: Asleep                 Donya Tomaro

## 2020-04-27 NOTE — ED Provider Notes (Signed)
CT of the chest returned showing possible food bolus impaction.  I discussed this with Dr. Marius Ditch.  We will get the patient in the hospital.  Dr. Marius Ditch will consult   Nena Polio, MD 04/27/20 [phone]

## 2020-04-27 NOTE — Anesthesia Procedure Notes (Signed)
Procedure Name: Intubation Date/Time: 04/27/2020 11:53 AM Performed by: Lerry Liner, CRNA Pre-anesthesia Checklist: Patient identified, Emergency Drugs available, Suction available and Patient being monitored Patient Re-evaluated:Patient Re-evaluated prior to induction Oxygen Delivery Method: Circle system utilized Preoxygenation: Pre-oxygenation with 100% oxygen Induction Type: IV induction, Rapid sequence and Cricoid Pressure applied Ventilation: Mask ventilation without difficulty Laryngoscope Size: McGraph and 4 Grade View: Grade II Tube type: Oral Tube size: 7.0 mm Number of attempts: 1 Airway Equipment and Method: Stylet and Oral airway Placement Confirmation: ETT inserted through vocal cords under direct vision,  positive ETCO2 and breath sounds checked- equal and bilateral Tube secured with: Tape Dental Injury: Teeth and Oropharynx as per pre-operative assessment

## 2020-04-27 NOTE — OR Nursing (Signed)
Report received from [facility] in ED. Covid negative.

## 2020-04-27 NOTE — Anesthesia Preprocedure Evaluation (Signed)
Anesthesia Evaluation  Patient identified by MRN, date of birth, ID band Patient awake    Reviewed: Allergy & Precautions, NPO status , Patient's Chart, lab work & pertinent test results  History of Anesthesia Complications Negative for: history of anesthetic complications  Airway Mallampati: II  TM Distance: >3 FB Neck ROM: Full    Dental  (+) Poor Dentition, Missing   Pulmonary neg sleep apnea, COPD,  COPD inhaler, former smoker,    breath sounds clear to auscultation- rhonchi (-) wheezing      Cardiovascular hypertension, Pt. on medications + Peripheral Vascular Disease and +CHF  (-) CAD, (-) Past MI, (-) Cardiac Stents and (-) CABG  Rhythm:Regular Rate:Normal - Systolic murmurs and - Diastolic murmurs    Neuro/Psych neg Seizures negative neurological ROS  negative psych ROS   GI/Hepatic Neg liver ROS, GERD  ,Esophageal foreign body   Endo/Other  negative endocrine ROSneg diabetes  Renal/GU negative Renal ROS     Musculoskeletal negative musculoskeletal ROS (+)   Abdominal (+) - obese,   Peds  Hematology  (+) anemia ,   Anesthesia Other Findings Past Medical History: No date: Acute embolism and thombos unsp deep vn unsp lower extremity  (HCC) No date: Acute respiratory failure (HCC) No date: Allergy No date: Anemia No date: Aneurysm of unspecified site [facility]) No date: ARF (acute respiratory failure) (HCC)     Comment:  H/O No date: Bronchitis No date: CHF (congestive heart failure) (HCC) No date: COPD (chronic obstructive pulmonary disease) (HCC) No date: Cough No date: Epistaxis No date: GERD (gastroesophageal reflux disease) No date: Gout No date: Hyperlipidemia No date: Hypertension No date: Hypokalemia No date: Insomnia No date: Muscle contracture     Comment:  MUSCLE SPASMS, muscle weakness No date: Peripheral vascular disease (HCC) No date: Pneumonia No date: Pressure ulcer    Reproductive/Obstetrics                             Anesthesia Physical Anesthesia Plan  ASA: III and emergent  Anesthesia Plan: General   Post-op Pain Management:    Induction: Intravenous, Rapid sequence and Cricoid pressure planned  PONV Risk Score and Plan: 1 and Ondansetron and Dexamethasone  Airway Management Planned: Oral ETT  Additional Equipment:   Intra-op Plan:   Post-operative Plan: Extubation in OR  Informed Consent: I have reviewed the patients History and Physical, chart, labs and discussed the procedure including the risks, benefits and alternatives for the proposed anesthesia with the patient or authorized representative who has indicated his/her understanding and acceptance.     Dental advisory given  Plan Discussed with: CRNA and Anesthesiologist  Anesthesia Plan Comments:         Anesthesia Quick Evaluation

## 2020-04-27 NOTE — ED Provider Notes (Signed)
[facility] Emergency Department Provider Note   ____________________________________________   First MD Initiated Contact with Patient 04/27/20 [phone]     (approximate)  I have reviewed the triage vital signs and the nursing notes.   HISTORY  Chief Complaint Hematemesis    HPI Timothy Juarez is a 69 y.o. male who presents to the ED via EMS from [facility] with a chief complaint of hematemesis.  Bright red emesis noted in emesis bag upon patient's arrival to triage.  Patient with a history of DVT, PAD on Eliquis, COPD, CHF, GI bleed who endorses EtOH use.  Symptoms associated with upper abdominal pain and mild shortness of breath.  Denies fever, cough, chest pain, diarrhea.     Past Medical History:  Diagnosis Date  . Acute embolism and thombos unsp deep vn unsp lower extremity ([place])   . Acute respiratory failure ([place])   . Allergy   . Anemia   . Aneurysm of unspecified site ([place])   . ARF (acute respiratory failure) ([place])    H/O  . Bronchitis   . CHF (congestive heart failure) ([place])   . COPD (chronic obstructive pulmonary disease) ([place])   . Cough   . Epistaxis   . GERD (gastroesophageal reflux disease)   . Gout   . Hyperlipidemia   . Hypertension   . Hypokalemia   . Insomnia   . Muscle contracture    MUSCLE SPASMS, muscle weakness  . Peripheral vascular disease ([place])   . Pneumonia   . Pressure ulcer     Patient Active Problem List   Diagnosis Date Noted  . Pulmonary disease   . Hypotension 09/29/2019  . Cellulitis and abscess of leg 09/27/2019  . Infection of above knee amputation stump ([place]) 09/27/2019  . History of COVID-19 09/27/2019  . Abscess of left AKA stump 09/27/2019  . (HFpEF) heart failure with preserved ejection fraction ([place]) 09/27/2019  . COPD with chronic bronchitis ([place]) 09/27/2019  . Chronic anticoagulation with eliquis 09/27/2019  . Preoperative clearance 09/27/2019  . Acute on chronic respiratory failure  with hypoxia ([place]) 12/27/2018  . Community acquired bilateral lower lobe pneumonia 12/27/2018  . COVID-19 virus infection 12/27/2018  . High anion gap metabolic acidosis 29/79/8921  . Severe sepsis ([place]) 12/27/2018  . AKI (acute kidney injury) ([place]) 12/27/2018  . Toxic encephalopathy 12/27/2018  . Acute respiratory failure with hypoxia ([place]) 12/27/2018  . Hx of AKA (above knee amputation), left ([place]) 11/02/2018  . Symptomatic anemia 10/10/2017  . Gastrointestinal hemorrhage   . Chronic deep vein thrombosis (DVT) of left lower extremity ([place])   . Iron deficiency anemia 04/21/2017  . Essential hypertension 03/13/2017  . Recurrent deep vein thrombosis (DVT) ([place]) 03/11/2017  . Hyperlipidemia 11/04/2016  . Pressure ulcer 04/11/2016  . Pseudoaneurysm of left femoral artery ([place]) 04/11/2016  . PAD (peripheral artery disease) ([place]) 04/10/2016  . Atherosclerosis of native arteries of extremities with gangrene, left leg ([place]) 11/01/2015  . Ischemia of lower extremity 09/14/2015  . Atherosclerotic peripheral vascular disease with ulceration ([place]) 01/18/2015    Past Surgical History:  Procedure Laterality Date  . AMPUTATION Left 09/19/2015   Procedure: AMPUTATION BELOW KNEE;  Surgeon: Algernon Huxley, MD;  Location: [facility] ORS;  Service: Vascular;  Laterality: Left;  . AMPUTATION Left 11/01/2015   Procedure: AMPUTATION ABOVE KNEE;  Surgeon: Algernon Huxley, MD;  Location: [facility] ORS;  Service: Vascular;  Laterality: Left;  . AMPUTATION Left 09/29/2019   Procedure: IRRIGATION AND DEBRIDEMENT OF  LEFT AKA;  Surgeon: Algernon Huxley, MD;  Location: [facility] ORS;  Service: General;  Laterality: Left;  . APPLICATION OF WOUND VAC Left 10/18/2015   Procedure: APPLICATION OF WOUND VAC;  Surgeon: Algernon Huxley, MD;  Location: [facility] ORS;  Service: Vascular;  Laterality: Left;  . APPLICATION OF WOUND VAC Left 09/30/2019   Procedure: APPLICATION OF WOUND VAC;  Surgeon: Katha Cabal, MD;  Location: [facility] ORS;  Service: Vascular;   Laterality: Left;  serial # [id number]  . CATARACT EXTRACTION W/PHACO Right 08/18/2019   Procedure: CATARACT EXTRACTION PHACO AND INTRAOCULAR LENS PLACEMENT ([place]) RIGHT;  Surgeon: Birder Robson, MD;  Location: [facility] ORS;  Service: Ophthalmology;  Laterality: Right;  [place] 03:29.0 CDE 45.68 Fluid Pack Lot # [id number]  . CENTRAL VENOUS CATHETER INSERTION Right 09/30/2019   Procedure: INSERTION CENTRAL LINE ADULT;  Surgeon: Katha Cabal, MD;  Location: [facility] ORS;  Service: Vascular;  Laterality: Right;  . COLONOSCOPY WITH PROPOFOL N/A 05/25/2017   Procedure: COLONOSCOPY WITH PROPOFOL;  Surgeon: Jonathon Bellows, MD;  Location: [facility] ENDOSCOPY;  Service: Gastroenterology;  Laterality: N/A;  . COLONOSCOPY WITH PROPOFOL N/A 10/14/2017   Procedure: COLONOSCOPY WITH PROPOFOL;  Surgeon: Jonathon Bellows, MD;  Location: [facility] ENDOSCOPY;  Service: Gastroenterology;  Laterality: N/A;  . ESOPHAGOGASTRODUODENOSCOPY (EGD) WITH PROPOFOL N/A 05/25/2017   Procedure: ESOPHAGOGASTRODUODENOSCOPY (EGD) WITH PROPOFOL;  Surgeon: Jonathon Bellows, MD;  Location: [facility] ENDOSCOPY;  Service: Gastroenterology;  Laterality: N/A;  . ESOPHAGOGASTRODUODENOSCOPY (EGD) WITH PROPOFOL N/A 10/14/2017   Procedure: ESOPHAGOGASTRODUODENOSCOPY (EGD) WITH PROPOFOL;  Surgeon: Jonathon Bellows, MD;  Location: [facility]COPY;  Service: Gastroenterology;  Laterality: N/A;  . EYE SURGERY    . GIVENS CAPSULE STUDY N/A 07/14/2017   Procedure: GIVENS CAPSULE STUDY;  Surgeon: Jonathon Bellows, MD;  Location: [facility] ENDOSCOPY;  Service: Gastroenterology;  Laterality: N/A;  . GIVENS CAPSULE STUDY N/A 10/30/2017   Procedure: GIVENS CAPSULE STUDY 12 HR;  Surgeon: Jonathon Bellows, MD;  Location: [facility] ENDOSCOPY;  Service: Gastroenterology;  Laterality: N/A;  . IVC FILTER INSERTION N/A 10/12/2017   Procedure: IVC FILTER INSERTION;  Surgeon: Algernon Huxley, MD;  Location: [place] CV LAB;  Service: Cardiovascular;  Laterality: N/A;  . LOWER EXTREMITY ANGIOGRAPHY Right 10/04/2018    Procedure: LOWER EXTREMITY ANGIOGRAPHY;  Surgeon: Algernon Huxley, MD;  Location: [place] CV LAB;  Service: Cardiovascular;  Laterality: Right;  . PERIPHERAL VASCULAR CATHETERIZATION Left 01/18/2015   Procedure: Lower Extremity Angiography;  Surgeon: Algernon Huxley, MD;  Location: [place] CV LAB;  Service: Cardiovascular;  Laterality: Left;  . PERIPHERAL VASCULAR CATHETERIZATION N/A 01/18/2015   Procedure: Lower Extremity Intervention;  Surgeon: Algernon Huxley, MD;  Location: [place] CV LAB;  Service: Cardiovascular;  Laterality: N/A;  . PERIPHERAL VASCULAR CATHETERIZATION  07/30/2015   Procedure: Lower Extremity Intervention;  Surgeon: Algernon Huxley, MD;  Location: [place] CV LAB;  Service: Cardiovascular;;  . PERIPHERAL VASCULAR CATHETERIZATION N/A 07/30/2015   Procedure: Abdominal Aortogram w/Lower Extremity;  Surgeon: Algernon Huxley, MD;  Location: [place] CV LAB;  Service: Cardiovascular;  Laterality: N/A;  . PERIPHERAL VASCULAR CATHETERIZATION Left 08/22/2015   Procedure: Lower Extremity Angiography;  Surgeon: Algernon Huxley, MD;  Location: [place] CV LAB;  Service: Cardiovascular;  Laterality: Left;  . PERIPHERAL VASCULAR CATHETERIZATION Left 08/22/2015   Procedure: Lower Extremity Intervention;  Surgeon: Algernon Huxley, MD;  Location: [place] CV LAB;  Service: Cardiovascular;  Laterality: Left;  . PERIPHERAL VASCULAR CATHETERIZATION Right 03/31/2016   Procedure: Lower Extremity Angiography;  Surgeon: Algernon Huxley, MD;  Location: [place] CV LAB;  Service: Cardiovascular;  Laterality: Right;  . PERIPHERAL VASCULAR CATHETERIZATION  03/31/2016   Procedure: Lower Extremity Intervention;  Surgeon: Algernon Huxley, MD;  Location: [place] CV LAB;  Service: Cardiovascular;;  . PERIPHERAL VASCULAR CATHETERIZATION Left 04/10/2016   Procedure: Lower Extremity Angiography;  Surgeon: Algernon Huxley, MD;  Location: [place] CV LAB;  Service: Cardiovascular;  Laterality: Left;  [name]  VACUUM ASSISTED CLOSURE CHANGE Left 10/03/2019   Procedure: LEFT THIGH VACUUM ASSISTED CLOSURE CHANGE;  Surgeon: Algernon Huxley, MD;  Location: [facility] ORS;  Service: General;  Laterality: Left;  . WOUND DEBRIDEMENT Left 10/18/2015   Procedure: DEBRIDEMENT WOUND   ( LEFT BKA DEBRIDEMENT );  Surgeon: Algernon Huxley, MD;  Location: [facility] ORS;  Service: Vascular;  Laterality: Left;    Prior to Admission medications   Medication Sig Start Date End Date Taking? Authorizing Provider  acetaminophen (TYLENOL) 325 MG tablet Take 650 mg by mouth every 4 (four) hours as needed for fever.     [provider]  acidophilus (RISAQUAD) CAPS capsule Take 1 capsule by mouth 2 (two) times daily.    [provider]  ammonium lactate (AMLACTIN) 12 % cream Apply topically 2 (two) times daily. Apply to right foot    [provider]  aspirin EC 81 MG tablet Take 1 tablet (81 mg total) by mouth daily. 10/04/18   Algernon Huxley, MD  atorvastatin (LIPITOR) 10 MG tablet Take 1 tablet (10 mg total) by mouth daily. Patient taking differently: Take 10 mg by mouth at bedtime.  10/04/18 11/22/19  Algernon Huxley, MD  baclofen (LIORESAL) 10 MG tablet Take 10 mg by mouth 3 (three) times daily. Hold for sedation    [provider]  brimonidine-timolol (COMBIGAN) 0.2-0.5 % ophthalmic solution Place 1 drop into the right eye 2 (two) times daily.    [provider]  ciclopirox (PENLAC) 8 % solution Apply 1 application topically daily. Apply solutions to toenails    [provider]  Difluprednate (DUREZOL) 0.05 % EMUL Place 1 drop into the right eye 3 (three) times daily.     [provider]  Difluprednate (DUREZOL) 0.05 % EMUL Apply 1 drop to eye daily.    [provider]  docusate sodium (COLACE) 100 MG capsule Take 100 mg by mouth daily as needed for mild constipation.    [provider]  ELIQUIS 5 MG TABS tablet Take 5 mg by mouth 2 (two) times daily.  07/01/18   [provider]  fentaNYL ([place]) 25 MCG/HR  10/13/19   [provider]  ferrous sulfate 324 (65 Fe) MG TBEC Take 324 mg by mouth 2 (two) times daily.     [provider]  furosemide (LASIX) 40 MG tablet Take 1 tablet (40 mg total) by mouth daily. 10/15/17   Loletha Grayer, MD  gabapentin (NEURONTIN) 100 MG capsule Take 100 mg by mouth at bedtime.     [provider]  guaiFENesin (ROBITUSSIN) 100 MG/5ML SOLN Take 15 mLs by mouth 3 (three) times daily as needed for cough (congestion).     [provider]  loperamide (IMODIUM) 2 MG capsule Take 2 mg by mouth 3 (three) times daily as needed for diarrhea or loose stools.    [provider]  Melatonin 5 MG TABS Take 5 mg by mouth at bedtime as needed (insomnia).     [provider]  Morphine  Sulfate (MORPHINE CONCENTRATE) 10 mg / 0.5 ml concentrated solution  10/13/19   [provider]  nitroGLYCERIN (NITROSTAT) 0.4 MG SL tablet Place 0.14 mg under the tongue every 5 (five) minutes as needed for chest pain.    [provider]  Omega-3 Fatty Acids (FISH OIL) 1000 MG CAPS Take 1,000 mg by mouth daily.     [provider]  Oxycodone HCl 10 MG TABS Take by mouth.    [provider]  oxymetazoline (AFRIN) 0.05 % nasal spray Place 2 sprays into both nostrils 2 (two) times daily as needed (For nose bleeds).     [provider]  potassium chloride SA (K-DUR,KLOR-CON) 20 MEQ tablet Take 20 mEq by mouth daily. With or after meal 10/15/17   [provider]  sodium chloride (OCEAN) 0.65 % SOLN nasal spray Place 2 sprays into both nostrils 2 (two) times daily as needed for congestion.    [provider]  tamsulosin (FLOMAX) 0.4 MG CAPS capsule Take 1 capsule (0.4 mg total) by mouth daily. 10/06/19   Max Sane, MD  TRELEGY ELLIPTA 100-62.5-25 MCG/INH AEPB Inhale 1 puff into the lungs every morning.  09/24/18   [provider]  triamcinolone  cream (KENALOG) 0.1 % Apply 1 application topically 2 (two) times daily.    [provider]    Allergies Patient has no known allergies.  Family History  Problem Relation Age of Onset  . Heart attack Mother   . Varicose Veins Neg Hx     Social History Social History   Tobacco Use  . Smoking status: Former Smoker    Packs/day: 1.00    Years: 44.00    Pack years: 44.00    Types: Cigarettes    Quit date: 11/17/2012    Years since quitting: 7.4  . Smokeless tobacco: Never Used  Vaping Use  . Vaping Use: Never used  Substance Use Topics  . Alcohol use: Yes  . Drug use: No    Review of Systems  Constitutional: No fever/chills Eyes: No visual changes. ENT: No sore throat. Cardiovascular: Denies chest pain. Respiratory: Positive for shortness of breath. Gastrointestinal: Positive for abdominal pain and hematemesis.  No diarrhea.  No constipation. Genitourinary: Negative for dysuria. Musculoskeletal: Negative for back pain. Skin: Negative for rash. Neurological: Negative for headaches, focal weakness or numbness.   ____________________________________________   PHYSICAL EXAM:  VITAL SIGNS: ED Triage Vitals  Enc Vitals Group     BP 04/26/20 2145 (!) 141/73     Pulse Rate 04/26/20 2145 (!) 108     Resp 04/26/20 2145 18     Temp 04/26/20 2145 98.7 F (37.1 C)     Temp Source 04/26/20 2145 Oral     SpO2 04/26/20 2003 98 %     Weight 04/26/20 2155 222 lb (100.7 kg)     Height 04/26/20 2155 6\' 1"  (1.854 m)     Head Circumference --      Peak Flow --      Pain Score 04/26/20 2154 4     Pain Loc --      Pain Edu? --      Excl. in [place]? --     Constitutional: Alert and oriented.  Elderly appearing and in mild acute distress. Eyes: Conjunctivae are normal. PERRL. EOMI. Head: Atraumatic. Nose: No congestion/rhinnorhea. Mouth/Throat: Mucous membranes are mildly dry.   Neck: No stridor.   Cardiovascular: Tachycardic rate, regular rhythm. Grossly normal heart  sounds.  Good peripheral circulation. Respiratory:  Normal respiratory effort.  No retractions. Lungs CTAB. Gastrointestinal: Soft and mildly tender to palpation epigastrium without rebound or guarding. No distention. No abdominal bruits. No CVA tenderness. Rectal: Melena.  Guaiac positive. Musculoskeletal: Left AKA.  No lower extremity tenderness nor edema.  No joint effusions. Neurologic:  Normal speech and language. No gross focal neurologic deficits are appreciated.  Skin:  Skin is warm, dry and intact. No rash noted. Psychiatric: Mood and affect are normal. Speech and behavior are normal.  ____________________________________________   LABS (all labs ordered are listed, but only abnormal results are displayed)  Labs Reviewed  CBC WITH DIFFERENTIAL/PLATELET - Abnormal; Notable for the following components:      Result Value   WBC 23.5 (*)    RBC 5.85 (*)    RDW 17.1 (*)    Neutro Abs 19.9 (*)    Monocytes Absolute 1.2 (*)    Abs Immature Granulocytes 0.15 (*)    All other components within normal limits  COMPREHENSIVE METABOLIC PANEL - Abnormal; Notable for the following components:   Glucose, Bld 136 (*)    Total Protein 8.2 (*)    Total Bilirubin 1.5 (*)    All other components within normal limits  CULTURE, BLOOD (ROUTINE X 2)  CULTURE, BLOOD (ROUTINE X 2)  SARS CORONAVIRUS 2 BY RT PCR (HOSPITAL ORDER, [place] LAB)  URINE CULTURE  LIPASE, BLOOD  LACTIC ACID, PLASMA  LACTIC ACID, PLASMA  ETHANOL  URINALYSIS, COMPLETE (UACMP) WITH MICROSCOPIC  TYPE AND SCREEN  TROPONIN I (HIGH SENSITIVITY)  TROPONIN I (HIGH SENSITIVITY)   ____________________________________________  EKG  ED ECG REPORT I, Shalise Rosado J, the attending physician, personally viewed and interpreted this ECG.   Date: 04/27/2020  EKG Time: 2154  Rate: 107  Rhythm: sinus tachycardia  Axis: Normal  Intervals:none  ST&T Change:  Nonspecific  ____________________________________________  RADIOLOGY  ED MD interpretation: No acute cardiopulmonary process; food bolus versus mass on esophagus CT abdomen/pelvis, recommend CT chest  Official radiology report(s): DG Chest 2 View  Result Date: 04/27/2020 CLINICAL DATA:  Vomiting blood and esophageal pain with swallowing. EXAM: CHEST - 2 VIEW COMPARISON:  10/01/2019 FINDINGS: Shallow inspiration. Heart size and pulmonary vascularity are normal. Scarring and volume loss in the left upper lung is unchanged. No airspace disease or consolidation. No pleural effusions. No pneumothorax. Mediastinal contours are unchanged with suggestion of paratracheal and pleural thickening in the left upper lung. IMPRESSION: No evidence of active pulmonary disease. Chronic scarring and pleural changes in the left apex. Electronically Signed   By: Lucienne Capers M.D.   On: 04/27/2020 00:10   CT Abdomen Pelvis W Contrast  Result Date: 04/27/2020 CLINICAL DATA:  Hematemesis and pain, initial encounter EXAM: CT ABDOMEN AND PELVIS WITH CONTRAST TECHNIQUE: Multidetector CT imaging of the abdomen and pelvis was performed using the standard protocol following bolus administration of intravenous contrast. CONTRAST:  79mL OMNIPAQUE IOHEXOL 300 MG/ML  SOLN COMPARISON:  10/11/2017 FINDINGS: Lower chest: Emphysematous changes and scarring are seen in the bases bilateral. Hepatobiliary: No focal liver abnormality is seen. No gallstones, gallbladder wall thickening, or biliary dilatation. Pancreas: Unremarkable. No pancreatic ductal dilatation or surrounding inflammatory changes. Spleen: Normal in size without focal abnormality. Adrenals/Urinary Tract: Adrenal glands are within normal limits. Kidneys demonstrate a normal enhancement pattern bilaterally. Delayed images demonstrate normal excretion. Left renal cyst is again noted centrally. No obstructive or inflammatory changes are seen. The bladder is well distended.  Stomach/Bowel: Scattered diverticular change of the colon is noted.  The appendix is within normal limits. Small bowel and stomach are within normal limits as well. Within the mid to distal esophagus there is a central area of decreased attenuation identified. This is incompletely evaluated on this exam. CT of the chest may be helpful. Alternate barium swallow may be helpful. Correlate with any possible retained food bolus. Vascular/Lymphatic: Aortic atherosclerosis. No enlarged abdominal or pelvic lymph nodes. IVC filter is noted in place. Vascular stents are noted in the iliac arteries bilaterally. Reproductive: Prostate is unremarkable. Other: No abdominal wall hernia or abnormality. No abdominopelvic ascites. Musculoskeletal: Degenerative changes of lumbar spine are noted. IMPRESSION: Area of decreased attenuation identified in the mid esophagus incompletely evaluated on this exam. This may represent a retained food bolus or possible mass. CT of the chest may be helpful for further evaluation. Alternatively barium swallow could be performed as clinically indicated. Diverticulosis without diverticulitis. Electronically Signed   By: Inez Catalina M.D.   On: 04/27/2020 06:43    ____________________________________________   PROCEDURES  Procedure(s) performed (including Critical Care):  .1-3 Lead EKG Interpretation Performed by: Paulette Blanch, MD Authorized by: Paulette Blanch, MD     Interpretation: abnormal     ECG rate:  102   ECG rate assessment: tachycardic     Rhythm: sinus tachycardia     Ectopy: none     Conduction: normal   Comments:     Patient placed on cardiac monitor to evaluate for arrhythmias     ____________________________________________   INITIAL IMPRESSION / ASSESSMENT AND PLAN / ED COURSE  As part of my medical decision making, I reviewed the following data within the [place] notes reviewed and incorporated, Labs reviewed, EKG interpreted, Old  chart reviewed, Radiograph reviewed and Notes from prior ED visits     Timothy Juarez was evaluated in Emergency Department on 04/27/2020 for the symptoms described in the history of present illness. He was evaluated in the context of the global COVID-19 pandemic, which necessitated consideration that the patient might be at risk for infection with the SARS-CoV-2 virus that causes COVID-19. Institutional protocols and algorithms that pertain to the evaluation of patients at risk for COVID-19 are in a state of rapid change based on information released by regulatory bodies including the CDC and federal and state organizations. These policies and algorithms were followed during the patient's care in the ED.    69 year old male sent for evaluation of hematemesis. Differential diagnosis includes, but is not limited to, biliary disease (biliary colic, acute cholecystitis, cholangitis, choledocholithiasis, etc), intrathoracic causes for epigastric abdominal pain including ACS, gastritis, duodenitis, pancreatitis, small bowel or large bowel obstruction, abdominal aortic aneurysm, hernia, and ulcer(s).  Laboratory results notable for leukocytosis which is increased from prior.  I personally reviewed patient's old chart and notes negative capsule endoscopy in 10/2017, negative EGD 09/2017, polypectomy on colonoscopy 09/2017.  Will initiate Protonix bolus with drip, place patient on CIWA scale, initiate IV fluid hydration and IV antiemetic.  Obtain CT abdomen/pelvis to evaluate source of leukocytosis.  Empirically treat with IV Zosyn.  Anticipate hospitalization.   Clinical Course as of Apr 27 657  Fri Apr 27, 2020  0657 Care transferred to Dr. Cinda Quest at change of shift pending CT scan and admission.   [JS]    Clinical Course User Index [JS] Paulette Blanch, MD     ____________________________________________   FINAL CLINICAL IMPRESSION(S) / ED DIAGNOSES  Final diagnoses:  Hematemesis with nausea   Leukocytosis, unspecified type  ED Discharge Orders    None      *Please note:  Timothy Juarez was evaluated in Emergency Department on 04/27/2020 for the symptoms described in the history of present illness. He was evaluated in the context of the global COVID-19 pandemic, which necessitated consideration that the patient might be at risk for infection with the SARS-CoV-2 virus that causes COVID-19. Institutional protocols and algorithms that pertain to the evaluation of patients at risk for COVID-19 are in a state of rapid change based on information released by regulatory bodies including the CDC and federal and state organizations. These policies and algorithms were followed during the patient's care in the ED.  Some ED evaluations and interventions may be delayed as a result of limited staffing during and the pandemic.*   Note:  This document was prepared using Dragon voice recognition software and may include unintentional dictation errors.   Paulette Blanch, MD 04/27/20 [phone]

## 2020-04-27 NOTE — Progress Notes (Addendum)
Pt in PACU, vomited dark red blood with clots. Pt alert and oriented stating he did not feel nauseated. VSS. Lab at bedside for STAT CBC. Dr. Marius Ditch notified and acknowledged. IV Reglan ordered and given. Protonix drip restarted. Will continue to assess.

## 2020-04-27 NOTE — H&P (Signed)
History and Physical    Timothy Juarez WPY:[id number] DOB: October 10, 1950 DOA: 04/27/2020  Referring MD/NP/PA:   PCP: Alvester Morin, MD   Patient coming from:  The patient is coming from SNF.  At baseline, pt is independent for most of ADL.        Chief Complaint: Hematemesis  HPI: Timothy Juarez is a 69 y.o. male with medical history significant of HTN, HLD, COPD, dCHF, DVT on Eliquis, L AKA, GIB, alcohol abuse, presents with hematemesis.     Patient has been having nausea, vomiting and hematemesis with bright red blood for 3 times.  Patient states that he had mild pain on swallowing yesterday, which has resolved currently. He also has melena stool.  Denies abdominal pain or diarrhea. Patient has mild shortness of breath, no chest pain, cough, fever or chills.  No symptoms of UTI or unilateral weakness.  ED Course: pt was found to have hemoglobin 16.2, WBC 23.5, lactic acid 1.8, troponin level 9, 12, negative Covid PCR, alcohol level less than 10, electrolytes and renal function okay, temperature normal, blood pressure 119/86, tachycardia, RR 19, oxygen sat 91 to 97% on room air.  Chest x-ray negative.  CT scan showed possible food bolus impaction.  Patient is placed on MedSurg bed for observation.  Dr. Marius Ditch for GI is consulted.  Review of Systems:   General: no fevers, chills, no body weight gain, fatigue HEENT: no blurry vision, hearing changes or sore throat Respiratory: no dyspnea, coughing, wheezing CV: no chest pain, no palpitations GI: has hematemesis, no abdominal pain, diarrhea, constipation GU: no dysuria, burning on urination, increased urinary frequency, hematuria  Ext: no leg edema Neuro: no unilateral weakness, numbness, or tingling, no vision change or hearing loss Skin: no rash, no skin tear. MSK: No muscle spasm, no deformity, no limitation of range of movement in spin Heme: No easy bruising.  Travel history: No recent long distant travel.  Allergy: No  Known Allergies  Past Medical History:  Diagnosis Date  . Acute embolism and thombos unsp deep vn unsp lower extremity ([place])   . Acute respiratory failure ([place])   . Allergy   . Anemia   . Aneurysm of unspecified site ([place])   . ARF (acute respiratory failure) ([place])    H/O  . Bronchitis   . CHF (congestive heart failure) ([place])   . COPD (chronic obstructive pulmonary disease) ([place])   . Cough   . Epistaxis   . GERD (gastroesophageal reflux disease)   . Gout   . Hyperlipidemia   . Hypertension   . Hypokalemia   . Insomnia   . Muscle contracture    MUSCLE SPASMS, muscle weakness  . Peripheral vascular disease ([place])   . Pneumonia   . Pressure ulcer     Past Surgical History:  Procedure Laterality Date  . AMPUTATION Left 09/19/2015   Procedure: AMPUTATION BELOW KNEE;  Surgeon: Algernon Huxley, MD;  Location: [facility] ORS;  Service: Vascular;  Laterality: Left;  . AMPUTATION Left 11/01/2015   Procedure: AMPUTATION ABOVE KNEE;  Surgeon: Algernon Huxley, MD;  Location: [facility];  Service: Vascular;  Laterality: Left;  . AMPUTATION Left 09/29/2019   Procedure: IRRIGATION AND DEBRIDEMENT OF LEFT AKA;  Surgeon: Algernon Huxley, MD;  Location: [facility];  Service: General;  Laterality: Left;  . APPLICATION OF WOUND VAC Left 10/18/2015   Procedure: APPLICATION OF WOUND VAC;  Surgeon: Algernon Huxley, MD;  Location: [facility];  Service: Vascular;  Laterality: Left;  .  APPLICATION OF WOUND VAC Left 09/30/2019   Procedure: APPLICATION OF WOUND VAC;  Surgeon: Katha Cabal, MD;  Location: [facility];  Service: Vascular;  Laterality: Left;  serial # [id number]  . CATARACT EXTRACTION W/PHACO Right 08/18/2019   Procedure: CATARACT EXTRACTION PHACO AND INTRAOCULAR LENS PLACEMENT ([place]) RIGHT;  Surgeon: Birder Robson, MD;  Location: [facility];  Service: Ophthalmology;  Laterality: Right;  [place] 03:29.0 CDE 45.68 Fluid Pack Lot # [id number]  . CENTRAL VENOUS CATHETER INSERTION Right 09/30/2019   Procedure: INSERTION CENTRAL  LINE ADULT;  Surgeon: Katha Cabal, MD;  Location: [facility];  Service: Vascular;  Laterality: Right;  . COLONOSCOPY WITH PROPOFOL N/A 05/25/2017   Procedure: COLONOSCOPY WITH PROPOFOL;  Surgeon: Jonathon Bellows, MD;  Location: [facility]COPY;  Service: Gastroenterology;  Laterality: N/A;  . COLONOSCOPY WITH PROPOFOL N/A 10/14/2017   Procedure: COLONOSCOPY WITH PROPOFOL;  Surgeon: Jonathon Bellows, MD;  Location: [facility] ENDOSCOPY;  Service: Gastroenterology;  Laterality: N/A;  . ESOPHAGOGASTRODUODENOSCOPY (EGD) WITH PROPOFOL N/A 05/25/2017   Procedure: ESOPHAGOGASTRODUODENOSCOPY (EGD) WITH PROPOFOL;  Surgeon: Jonathon Bellows, MD;  Location: [facility] ENDOSCOPY;  Service: Gastroenterology;  Laterality: N/A;  . ESOPHAGOGASTRODUODENOSCOPY (EGD) WITH PROPOFOL N/A 10/14/2017   Procedure: ESOPHAGOGASTRODUODENOSCOPY (EGD) WITH PROPOFOL;  Surgeon: Jonathon Bellows, MD;  Location: [facility] ENDOSCOPY;  Service: Gastroenterology;  Laterality: N/A;  . EYE SURGERY    . GIVENS CAPSULE STUDY N/A 07/14/2017   Procedure: GIVENS CAPSULE STUDY;  Surgeon: Jonathon Bellows, MD;  Location: [facility] ENDOSCOPY;  Service: Gastroenterology;  Laterality: N/A;  . GIVENS CAPSULE STUDY N/A 10/30/2017   Procedure: GIVENS CAPSULE STUDY 12 HR;  Surgeon: Jonathon Bellows, MD;  Location: [facility] ENDOSCOPY;  Service: Gastroenterology;  Laterality: N/A;  . IVC FILTER INSERTION N/A 10/12/2017   Procedure: IVC FILTER INSERTION;  Surgeon: Algernon Huxley, MD;  Location: [place] CV LAB;  Service: Cardiovascular;  Laterality: N/A;  . LOWER EXTREMITY ANGIOGRAPHY Right 10/04/2018   Procedure: LOWER EXTREMITY ANGIOGRAPHY;  Surgeon: Algernon Huxley, MD;  Location: [place] CV LAB;  Service: Cardiovascular;  Laterality: Right;  . PERIPHERAL VASCULAR CATHETERIZATION Left 01/18/2015   Procedure: Lower Extremity Angiography;  Surgeon: Algernon Huxley, MD;  Location: [place] CV LAB;  Service: Cardiovascular;  Laterality: Left;  . PERIPHERAL VASCULAR CATHETERIZATION N/A 01/18/2015    Procedure: Lower Extremity Intervention;  Surgeon: Algernon Huxley, MD;  Location: [place] CV LAB;  Service: Cardiovascular;  Laterality: N/A;  . PERIPHERAL VASCULAR CATHETERIZATION  07/30/2015   Procedure: Lower Extremity Intervention;  Surgeon: Algernon Huxley, MD;  Location: [place] CV LAB;  Service: Cardiovascular;;  . PERIPHERAL VASCULAR CATHETERIZATION N/A 07/30/2015   Procedure: Abdominal Aortogram w/Lower Extremity;  Surgeon: Algernon Huxley, MD;  Location: [place] CV LAB;  Service: Cardiovascular;  Laterality: N/A;  . PERIPHERAL VASCULAR CATHETERIZATION Left 08/22/2015   Procedure: Lower Extremity Angiography;  Surgeon: Algernon Huxley, MD;  Location: [place] CV LAB;  Service: Cardiovascular;  Laterality: Left;  . PERIPHERAL VASCULAR CATHETERIZATION Left 08/22/2015   Procedure: Lower Extremity Intervention;  Surgeon: Algernon Huxley, MD;  Location: [place] CV LAB;  Service: Cardiovascular;  Laterality: Left;  . PERIPHERAL VASCULAR CATHETERIZATION Right 03/31/2016   Procedure: Lower Extremity Angiography;  Surgeon: Algernon Huxley, MD;  Location: [place] CV LAB;  Service: Cardiovascular;  Laterality: Right;  . PERIPHERAL VASCULAR CATHETERIZATION  03/31/2016   Procedure: Lower Extremity Intervention;  Surgeon: Algernon Huxley, MD;  Location: [place] CV LAB;  Service: Cardiovascular;;  . PERIPHERAL VASCULAR CATHETERIZATION  Left 04/10/2016   Procedure: Lower Extremity Angiography;  Surgeon: Algernon Huxley, MD;  Location: [place] CV LAB;  Service: Cardiovascular;  Laterality: Left;  [name] VACUUM ASSISTED CLOSURE CHANGE Left 10/03/2019   Procedure: LEFT THIGH VACUUM ASSISTED CLOSURE CHANGE;  Surgeon: Algernon Huxley, MD;  Location: [facility];  Service: General;  Laterality: Left;  . WOUND DEBRIDEMENT Left 10/18/2015   Procedure: DEBRIDEMENT WOUND   ( LEFT BKA DEBRIDEMENT );  Surgeon: Algernon Huxley, MD;  Location: [facility];  Service: Vascular;  Laterality: Left;    Social History:  reports that  he quit smoking about 7 years ago. His smoking use included cigarettes. He has a 44.00 pack-year smoking history. He has never used smokeless tobacco. He reports current alcohol use. He reports that he does not use drugs.  Family History:  Family History  Problem Relation Age of Onset  . Heart attack Mother   . Varicose Veins Neg Hx      Prior to Admission medications   Medication Sig Start Date End Date Taking? Authorizing Provider  acetaminophen (TYLENOL) 325 MG tablet Take 650 mg by mouth every 4 (four) hours as needed for fever.     [provider]  acidophilus (RISAQUAD) CAPS capsule Take 1 capsule by mouth 2 (two) times daily.    [provider]  ammonium lactate (AMLACTIN) 12 % cream Apply topically 2 (two) times daily. Apply to right foot    [provider]  aspirin EC 81 MG tablet Take 1 tablet (81 mg total) by mouth daily. 10/04/18   Algernon Huxley, MD  atorvastatin (LIPITOR) 10 MG tablet Take 1 tablet (10 mg total) by mouth daily. Patient taking differently: Take 10 mg by mouth at bedtime.  10/04/18 11/22/19  Algernon Huxley, MD  baclofen (LIORESAL) 10 MG tablet Take 10 mg by mouth 3 (three) times daily. Hold for sedation    [provider]  brimonidine-timolol (COMBIGAN) 0.2-0.5 % ophthalmic solution Place 1 drop into the right eye 2 (two) times daily.    [provider]  ciclopirox (PENLAC) 8 % solution Apply 1 application topically daily. Apply solutions to toenails    [provider]  Difluprednate (DUREZOL) 0.05 % EMUL Place 1 drop into the right eye 3 (three) times daily.     [provider]  Difluprednate (DUREZOL) 0.05 % EMUL Apply 1 drop to eye daily.    [provider]  docusate sodium (COLACE) 100 MG capsule Take 100 mg by mouth daily as needed for mild constipation.    [provider]  ELIQUIS 5 MG TABS tablet Take 5 mg by mouth 2 (two) times daily.  07/01/18   [provider]  fentaNYL  ([place]nie) 25 MCG/HR  10/13/19   [provider]  ferrous sulfate 324 (65 Fe) MG TBEC Take 324 mg by mouth 2 (two) times daily.     [provider]  furosemide (LASIX) 40 MG tablet Take 1 tablet (40 mg total) by mouth daily. 10/15/17   Loletha Grayer, MD  gabapentin (NEURONTIN) 100 MG capsule Take 100 mg by mouth at bedtime.     [provider]  guaiFENesin (ROBITUSSIN) 100 MG/5ML SOLN Take 15 mLs by mouth 3 (three) times daily as needed for cough (congestion).     [provider]  loperamide (IMODIUM) 2 MG capsule Take 2 mg by mouth 3 (three) times daily as needed for diarrhea or loose stools.    [provider]  Melatonin  5 MG TABS Take 5 mg by mouth at bedtime as needed (insomnia).     [provider]  Morphine Sulfate (MORPHINE CONCENTRATE) 10 mg / 0.5 ml concentrated solution  10/13/19   [provider]  nitroGLYCERIN (NITROSTAT) 0.4 MG SL tablet Place 0.14 mg under the tongue every 5 (five) minutes as needed for chest pain.    [provider]  Omega-3 Fatty Acids (FISH OIL) 1000 MG CAPS Take 1,000 mg by mouth daily.     [provider]  Oxycodone HCl 10 MG TABS Take by mouth.    [provider]  oxymetazoline (AFRIN) 0.05 % nasal spray Place 2 sprays into both nostrils 2 (two) times daily as needed (For nose bleeds).     [provider]  potassium chloride SA (K-DUR,KLOR-CON) 20 MEQ tablet Take 20 mEq by mouth daily. With or after meal 10/15/17   [provider]  sodium chloride (OCEAN) 0.65 % SOLN nasal spray Place 2 sprays into both nostrils 2 (two) times daily as needed for congestion.    [provider]  tamsulosin (FLOMAX) 0.4 MG CAPS capsule Take 1 capsule (0.4 mg total) by mouth daily. 10/06/19   Max Sane, MD  TRELEGY ELLIPTA 100-62.5-25 MCG/INH AEPB Inhale 1 puff into the lungs every morning.  09/24/18   [provider]  triamcinolone cream (KENALOG) 0.1 % Apply 1  application topically 2 (two) times daily.    [provider]    Physical Exam: Vitals:   04/27/20 0533 04/27/20 0600 04/27/20 0623 04/27/20 1053  BP: 116/71 119/86 119/86 117/70  Pulse: (!) 113 99 99 (!) 103  Resp: 16 19 19 16   Temp:      TempSrc:      SpO2: 98% 91% 97% 94%  Weight:      Height:       General: Not in acute distress HEENT:       Eyes: PERRL, EOMI, no scleral icterus.       ENT: No discharge from the ears and nose, no pharynx injection, no tonsillar enlargement.        Neck: No JVD, no bruit, no mass felt. Heme: No neck lymph node enlargement. Cardiac: S1/S2, RRR, No murmurs, No gallops or rubs. Respiratory: No rales, wheezing, rhonchi or rubs. GI: Soft, nondistended, nontender, no rebound pain, no organomegaly, BS present. GU: No hematuria Ext: No pitting leg edema bilaterally. S/p of left AKA Musculoskeletal: No joint deformities, No joint redness or warmth, no limitation of ROM in spin. Skin: No rashes.  Neuro: Alert, oriented X3, cranial nerves II-XII grossly intact, moves all extremities. Psych: Patient is not psychotic, no suicidal or hemocidal ideation.  Labs on Admission: I have personally reviewed following labs and imaging studies  CBC: Recent Labs  Lab 04/26/20 2150  WBC 23.5*  NEUTROABS 19.9*  HGB 16.2  HCT 48.1  MCV 82.2  PLT 629   Basic Metabolic Panel: Recent Labs  Lab 04/26/20 2150  NA 140  K 3.8  CL 103  CO2 26  GLUCOSE 136*  BUN 21  CREATININE 1.14  CALCIUM 8.9   GFR: Estimated Creatinine Clearance: 76.3 mL/min (by C-G formula based on SCr of 1.14 mg/dL). Liver Function Tests: Recent Labs  Lab 04/26/20 2150  AST 23  ALT 22  ALKPHOS 113  BILITOT 1.5*  PROT 8.2*  ALBUMIN 4.6   Recent Labs  Lab 04/26/20 2150  LIPASE 21   No results for input(s): AMMONIA in the last 168 hours. Coagulation  Profile: No results for input(s): INR, PROTIME in the last 168 hours. Cardiac Enzymes: No results for input(s):  CKTOTAL, CKMB, CKMBINDEX, TROPONINI in the last 168 hours. BNP (last 3 results) No results for input(s): PROBNP in the last 8760 hours. HbA1C: No results for input(s): HGBA1C in the last 72 hours. CBG: No results for input(s): GLUCAP in the last 168 hours. Lipid Profile: No results for input(s): CHOL, HDL, LDLCALC, TRIG, CHOLHDL, LDLDIRECT in the last 72 hours. Thyroid Function Tests: No results for input(s): TSH, T4TOTAL, FREET4, T3FREE, THYROIDAB in the last 72 hours. Anemia Panel: No results for input(s): VITAMINB12, FOLATE, FERRITIN, TIBC, IRON, RETICCTPCT in the last 72 hours. Urine analysis:    Component Value Date/Time   COLORURINE AMBER (A) 12/27/2018 1045   APPEARANCEUR HAZY (A) 12/27/2018 1045   APPEARANCEUR Clear 11/29/2012 1529   LABSPEC 1.016 12/27/2018 1045   LABSPEC 1.017 11/29/2012 1529   PHURINE 5.0 12/27/2018 1045   GLUCOSEU NEGATIVE 12/27/2018 1045   GLUCOSEU Negative 11/29/2012 1529   HGBUR SMALL (A) 12/27/2018 1045   BILIRUBINUR NEGATIVE 12/27/2018 1045   BILIRUBINUR Negative 11/29/2012 1529   KETONESUR NEGATIVE 12/27/2018 1045   PROTEINUR 30 (A) 12/27/2018 1045   NITRITE NEGATIVE 12/27/2018 [place] 12/27/2018 1045   LEUKOCYTESUR 1+ 11/29/2012 1529   Sepsis Labs: @LABRCNTIP (procalcitonin:4,lacticidven:4) ) Recent Results (from the past 240 hour(s))  SARS Coronavirus 2 by RT PCR (hospital order, performed in [place] hospital lab) Nasopharyngeal Nasopharyngeal Swab     Status: None   Collection Time: 04/27/20  6:18 AM   Specimen: Nasopharyngeal Swab  Result Value Ref Range Status   SARS Coronavirus 2 NEGATIVE NEGATIVE Final    Comment: (NOTE) SARS-CoV-2 target nucleic acids are NOT DETECTED.  The SARS-CoV-2 RNA is generally detectable in upper and lower respiratory specimens during the acute phase of infection. The lowest concentration of SARS-CoV-2 viral copies this assay can detect is 250 copies / mL. A negative result does  not preclude SARS-CoV-2 infection and should not be used as the sole basis for treatment or other patient management decisions.  A negative result may occur with improper specimen collection / handling, submission of specimen other than nasopharyngeal swab, presence of viral mutation(s) within the areas targeted by this assay, and inadequate number of viral copies (<250 copies / mL). A negative result must be combined with clinical observations, patient history, and epidemiological information.  Fact Sheet for Patients:   StrictlyIdeas.no  Fact Sheet for Healthcare Providers: BankingDealers.co.za  This test is not yet approved or  cleared by the [place] FDA and has been authorized for detection and/or diagnosis of SARS-CoV-2 by FDA under an Emergency Use Authorization (EUA).  This EUA will remain in effect (meaning this test can be used) for the duration of the COVID-19 declaration under Section 564(b)(1) of the Act, 21 U.S.C. section 360bbb-3(b)(1), unless the authorization is terminated or revoked sooner.  Performed at [facility], [address] [place], [place] [address]      Radiological Exams on Admission: DG Chest 2 View  Result Date: 04/27/2020 CLINICAL DATA:  Vomiting blood and esophageal pain with swallowing. EXAM: CHEST - 2 VIEW COMPARISON:  10/01/2019 FINDINGS: Shallow inspiration. Heart size and pulmonary vascularity are normal. Scarring and volume loss in the left upper lung is unchanged. No airspace disease or consolidation. No pleural effusions. No pneumothorax. Mediastinal contours are unchanged with suggestion of paratracheal and pleural thickening in the left upper lung. IMPRESSION: No evidence of active pulmonary  disease. Chronic scarring and pleural changes in the left apex. Electronically Signed   By: Lucienne Capers M.D.   On: 04/27/2020 00:10   CT Chest Wo Contrast  Result Date:  04/27/2020 CLINICAL DATA:  Food bolus versus mass. EXAM: CT CHEST WITHOUT CONTRAST TECHNIQUE: Multidetector CT imaging of the chest was performed following the standard protocol without IV contrast. COMPARISON:  Abdominal CT from earlier today FINDINGS: Cardiovascular: Normal heart size. No pericardial effusion. Diffuse atherosclerotic calcification of the coronaries and also of the aorta. Mediastinum/Nodes: Midesophageal low-density appearance on prior CT correlates with frothy material distending the mid esophagus. No discrete mass is seen, although limited without contrast. Prominence of the subcarinal lymph node but no definite adenopathy. Anticipate upcoming EGD. Lungs/Pleura: Emphysema with areas of fibrosis and subpleural honeycombing. There is a large bulla involving the right anterior paramediastinal space with mass effect on mediastinal structures. Upper Abdomen: Reported on prior study Musculoskeletal: Unremarkable. IMPRESSION: 1. Distended midesophagus by semi solid appearing material, most likely food bolus. A distal stricture is implied. A lesion could easily be obscured and there is history of hematemesis, recommend GI follow-up. 2. Emphysema with superimposed fibrotic areas on the left. 3. Aortic Atherosclerosis (ICD10-I70.0) and Emphysema (ICD10-J43.9). Electronically Signed   By: Monte Fantasia M.D.   On: 04/27/2020 07:57   CT Abdomen Pelvis W Contrast  Result Date: 04/27/2020 CLINICAL DATA:  Hematemesis and pain, initial encounter EXAM: CT ABDOMEN AND PELVIS WITH CONTRAST TECHNIQUE: Multidetector CT imaging of the abdomen and pelvis was performed using the standard protocol following bolus administration of intravenous contrast. CONTRAST:  42mL OMNIPAQUE IOHEXOL 300 MG/ML  SOLN COMPARISON:  10/11/2017 FINDINGS: Lower chest: Emphysematous changes and scarring are seen in the bases bilateral. Hepatobiliary: No focal liver abnormality is seen. No gallstones, gallbladder wall thickening, or  biliary dilatation. Pancreas: Unremarkable. No pancreatic ductal dilatation or surrounding inflammatory changes. Spleen: Normal in size without focal abnormality. Adrenals/Urinary Tract: Adrenal glands are within normal limits. Kidneys demonstrate a normal enhancement pattern bilaterally. Delayed images demonstrate normal excretion. Left renal cyst is again noted centrally. No obstructive or inflammatory changes are seen. The bladder is well distended. Stomach/Bowel: Scattered diverticular change of the colon is noted. The appendix is within normal limits. Small bowel and stomach are within normal limits as well. Within the mid to distal esophagus there is a central area of decreased attenuation identified. This is incompletely evaluated on this exam. CT of the chest may be helpful. Alternate barium swallow may be helpful. Correlate with any possible retained food bolus. Vascular/Lymphatic: Aortic atherosclerosis. No enlarged abdominal or pelvic lymph nodes. IVC filter is noted in place. Vascular stents are noted in the iliac arteries bilaterally. Reproductive: Prostate is unremarkable. Other: No abdominal wall hernia or abnormality. No abdominopelvic ascites. Musculoskeletal: Degenerative changes of lumbar spine are noted. IMPRESSION: Area of decreased attenuation identified in the mid esophagus incompletely evaluated on this exam. This may represent a retained food bolus or possible mass. CT of the chest may be helpful for further evaluation. Alternatively barium swallow could be performed as clinically indicated. Diverticulosis without diverticulitis. Electronically Signed   By: Inez Catalina M.D.   On: 04/27/2020 06:43     EKG: Independently reviewed.  Sinus rhythm, QTC 435, sinus rhythm, LAE, LAD, poor R wave progression   Assessment/Plan Principal Problem:   Hematemesis Active Problems:   Hyperlipidemia   Essential hypertension   Chronic deep vein thrombosis (DVT) of left lower extremity (HCC)    COPD with chronic bronchitis (HCC)  Chronic diastolic CHF (congestive heart failure) (HCC)   Leukocytosis   Alcohol use   Hematemesis: Hemoglobin stable 16.2.  Patient has tachycardia, but hemodynamically stable.  CT scan showed possible food bolus impaction versus mass.  Dr. Marius Ditch of GI is consulted.  - will place in med-surg bed obs - GI consulted by Ed, will follow up recommendations - NPO for possible EGD - IVF: 1L NS bolus in ED - Started IV pantoprazole gtt - Zofran IV for nausea - Avoid NSAIDs and SQ heparin - Maintain IV access (2 large bore IVs if possible). - Monitor closely and follow q6h cbc, transfuse as necessary, if Hgb<7.0 - LaB: INR, PTT and type screen -Continue to hold Eliquis and ASA  Hyperlipidemia: -Lipitor  Essential hypertension -IV hydralazine as needed -Patient is on Lasix for CHF  Chronic deep vein thrombosis (DVT) of left lower extremity (HCC) -Hold Eliquis  COPD with chronic bronchitis ([place]): Stable -Bronchodilators as needed  Chronic diastolic CHF (congestive heart failure) ([place]): 2D echo on 10/02/2019 showed a EF of 60-65%.  Patient does not have leg edema.  Chest x-ray has no pulmonary edema.  CHF seems to be compensated. -Continue home Lasix  Leukocytosis: WBC 23.5.  No source of infection identified.  Patient received 1 dose of Zosyn, will hold antibiotics now. -Follow-up blood culture -Follow-up urinalysis and urine culture  Alcohol use: Daughter states that the patient stopped drinking alcohol, patient states that he is still drinking alcohol. -CIWA protocol    DVT ppx: SCD only to right leg Code Status: Full code Family Communication: Yes, patient's daughter by phone Disposition Plan:  Anticipate discharge back to previous SNFenvironment Consults called:  Dr. Marius Ditch of GI Admission status: Med-surg bed for obs   Status is: Observation  The patient remains OBS appropriate and will d/c before 2 midnights.  Dispo: The patient  is from: SNF              Anticipated d/c is to: SNF              Anticipated d/c date is: 1 day              Patient currently is not medically stable to d/c.           Date of Service 04/27/2020    Timothy Juarez [facility]   If 7PM-7AM, please contact night-coverage [email or web address] 04/27/2020, 11:12 AM

## 2020-04-27 NOTE — Transfer of Care (Signed)
Immediate Anesthesia Transfer of Care Note  Patient: Timothy Juarez  Procedure(s) Performed: ESOPHAGOGASTRODUODENOSCOPY (EGD) WITH PROPOFOL (N/A )  Patient Location: PACU  Anesthesia Type:General  Level of Consciousness: drowsy  Airway & Oxygen Therapy: Patient Spontanous Breathing  Post-op Assessment: Report given to RN  Post vital signs: stable  Last Vitals:  Vitals Value Taken Time  BP 121/66 04/27/20 1250  Temp    Pulse 110 04/27/20 1251  Resp 25 04/27/20 1251  SpO2 100 % 04/27/20 1251  Vitals shown include unvalidated device data.  Last Pain:  Vitals:   04/27/20 1125  TempSrc: Temporal  PainSc: 0-No pain         Complications: No complications documented.

## 2020-04-27 NOTE — Consult Note (Addendum)
   Rohini R Vanga, MD [address]  [address]  [place], [place] [address]  Main: [phone]  Fax: [phone] Pager: [phone]   Consultation  Referring Provider:     No ref. provider found Primary Care Physician:  Slade-Hartman, Venezela, MD Primary Gastroenterologist:  Dr. Anna         Reason for Consultation:     Possible food bolus impaction  Date of Admission:  04/27/2020 Date of Consultation:  04/27/2020         HPI:   Timothy Juarez is a 69 y.o. male with history of HTN, HLD, COPD, dCHF, DVT on Eliquis, L AKA, alcohol abuse, known history of iron and B12 deficiency anemia, who underwent bidirectional endoscopy x2 within last 3 years with no obvious source identified Patient presented from [facility] yesterday with 3 episodes of vomiting which contained blood, pain in his chest, emesis was bright red blood. Last dose of Eliquis prior to admission.  He does endorse alcohol use.,  Reports upper abdominal pain associated with mild shortness of breath.  He denies fever, chills, cough, chest pain, melena, rectal bleeding.Patient reported he ate ribs yesterday and started vomiting blood since last night  Labs significant for leukocytosis, hemoglobin 16.2, mildly elevated total bilirubin, normal transaminases, lipase and lactic acid normal.  Blood cultures drawn he underwent CT chest, abdomen and pelvis which revealed  He is on pantoprazole drip, Zosyn, n.p.o. and GI is consulted for evaluation of food bolus impaction  IMPRESSION: 1. Distended midesophagus by semi solid appearing material, most likely food bolus. A distal stricture is implied. A lesion could easily be obscured and there is history of hematemesis, recommend GI follow-up. 2. Emphysema with superimposed fibrotic areas on the left. 3. Aortic Atherosclerosis (ICD10-I70.0) and Emphysema (ICD10-J43.9).  NSAIDs: None  Antiplts/Anticoagulants/Anti thrombotics: Eliquis for history of dvt  GI  Procedures: 05/25/17- EGD -hiatal hernia, diffuse candidiasis seen , dudoenal bx showed normal villu . Gastric bx showed erosive gastritis with intestinal metaplasia, negative for H pylori. Colonoscopy- polyps in transverse colon were tubulovillous with high grade dysplasia. Ascending and descending colon polyps were tubular adenoma. Some polyps were taken out piece meal   08/21/17- Capsule study was incomplete as it did not exit the bowel and also had limited visualization . Poor bowel prep  Repeat EGD and colonoscopy October 14, 2017 with no gross lesion  Past Medical History:  Diagnosis Date  . Acute embolism and thombos unsp deep vn unsp lower extremity (HCC)   . Acute respiratory failure (HCC)   . Allergy   . Anemia   . Aneurysm of unspecified site (HCC)   . ARF (acute respiratory failure) (HCC)    H/O  . Bronchitis   . CHF (congestive heart failure) (HCC)   . COPD (chronic obstructive pulmonary disease) (HCC)   . Cough   . Epistaxis   . GERD (gastroesophageal reflux disease)   . Gout   . Hyperlipidemia   . Hypertension   . Hypokalemia   . Insomnia   . Muscle contracture    MUSCLE SPASMS, muscle weakness  . Peripheral vascular disease (HCC)   . Pneumonia   . Pressure ulcer     Past Surgical History:  Procedure Laterality Date  . AMPUTATION Left 09/19/2015   Procedure: AMPUTATION BELOW KNEE;  Surgeon: Jason S Dew, MD;  Location: [facility];  Service: Vascular;  Laterality: Left;  . AMPUTATION Left 11/01/2015   Procedure: AMPUTATION ABOVE KNEE;  Surgeon: Jason S Dew,   MD;  Location: [facility];  Service: Vascular;  Laterality: Left;  . AMPUTATION Left 09/29/2019   Procedure: IRRIGATION AND DEBRIDEMENT OF LEFT AKA;  Surgeon: Dew, Jason S, MD;  Location: [facility];  Service: General;  Laterality: Left;  . APPLICATION OF WOUND VAC Left 10/18/2015   Procedure: APPLICATION OF WOUND VAC;  Surgeon: Jason S Dew, MD;  Location: [facility];  Service: Vascular;  Laterality: Left;  . APPLICATION  OF WOUND VAC Left 09/30/2019   Procedure: APPLICATION OF WOUND VAC;  Surgeon: Schnier, Gregory G, MD;  Location: [facility];  Service: Vascular;  Laterality: Left;  serial # [id number]  . CATARACT EXTRACTION W/PHACO Right 08/18/2019   Procedure: CATARACT EXTRACTION PHACO AND INTRAOCULAR LENS PLACEMENT (IOC) RIGHT;  Surgeon: Porfilio, William, MD;  Location: [facility];  Service: Ophthalmology;  Laterality: Right;  US 03:29.0 CDE 45.68 Fluid Pack Lot # [id number]  . CENTRAL VENOUS CATHETER INSERTION Right 09/30/2019   Procedure: INSERTION CENTRAL LINE ADULT;  Surgeon: Schnier, Gregory G, MD;  Location: [facility];  Service: Vascular;  Laterality: Right;  . COLONOSCOPY WITH PROPOFOL N/A 05/25/2017   Procedure: COLONOSCOPY WITH PROPOFOL;  Surgeon: Anna, Kiran, MD;  Location: [facility];  Service: Gastroenterology;  Laterality: N/A;  . COLONOSCOPY WITH PROPOFOL N/A 10/14/2017   Procedure: COLONOSCOPY WITH PROPOFOL;  Surgeon: Anna, Kiran, MD;  Location: [facility];  Service: Gastroenterology;  Laterality: N/A;  . ESOPHAGOGASTRODUODENOSCOPY (EGD) WITH PROPOFOL N/A 05/25/2017   Procedure: ESOPHAGOGASTRODUODENOSCOPY (EGD) WITH PROPOFOL;  Surgeon: Anna, Kiran, MD;  Location: [facility];  Service: Gastroenterology;  Laterality: N/A;  . ESOPHAGOGASTRODUODENOSCOPY (EGD) WITH PROPOFOL N/A 10/14/2017   Procedure: ESOPHAGOGASTRODUODENOSCOPY (EGD) WITH PROPOFOL;  Surgeon: Anna, Kiran, MD;  Location: [facility];  Service: Gastroenterology;  Laterality: N/A;  . EYE SURGERY    . GIVENS CAPSULE STUDY N/A 07/14/2017   Procedure: GIVENS CAPSULE STUDY;  Surgeon: Anna, Kiran, MD;  Location: [facility];  Service: Gastroenterology;  Laterality: N/A;  . GIVENS CAPSULE STUDY N/A 10/30/2017   Procedure: GIVENS CAPSULE STUDY 12 HR;  Surgeon: Anna, Kiran, MD;  Location: [facility];  Service: Gastroenterology;  Laterality: N/A;  . IVC FILTER INSERTION N/A 10/12/2017   Procedure: IVC FILTER INSERTION;  Surgeon: Dew,  Jason S, MD;  Location: [facility];  Service: Cardiovascular;  Laterality: N/A;  . LOWER EXTREMITY ANGIOGRAPHY Right 10/04/2018   Procedure: LOWER EXTREMITY ANGIOGRAPHY;  Surgeon: Dew, Jason S, MD;  Location: [facility];  Service: Cardiovascular;  Laterality: Right;  . PERIPHERAL VASCULAR CATHETERIZATION Left 01/18/2015   Procedure: Lower Extremity Angiography;  Surgeon: Jason S Dew, MD;  Location: [facility];  Service: Cardiovascular;  Laterality: Left;  . PERIPHERAL VASCULAR CATHETERIZATION N/A 01/18/2015   Procedure: Lower Extremity Intervention;  Surgeon: Jason S Dew, MD;  Location: [facility];  Service: Cardiovascular;  Laterality: N/A;  . PERIPHERAL VASCULAR CATHETERIZATION  07/30/2015   Procedure: Lower Extremity Intervention;  Surgeon: Jason S Dew, MD;  Location: [facility];  Service: Cardiovascular;;  . PERIPHERAL VASCULAR CATHETERIZATION N/A 07/30/2015   Procedure: Abdominal Aortogram w/Lower Extremity;  Surgeon: Jason S Dew, MD;  Location: [facility];  Service: Cardiovascular;  Laterality: N/A;  . PERIPHERAL VASCULAR CATHETERIZATION Left 08/22/2015   Procedure: Lower Extremity Angiography;  Surgeon: Jason S Dew, MD;  Location: [facility];  Service: Cardiovascular;  Laterality: Left;  . PERIPHERAL VASCULAR CATHETERIZATION Left 08/22/2015   Procedure: Lower Extremity Intervention;  Surgeon: Jason S Dew, MD;  Location:   [facility];  Service: Cardiovascular;  Laterality: Left;  . PERIPHERAL VASCULAR CATHETERIZATION Right 03/31/2016   Procedure: Lower Extremity Angiography;  Surgeon: Jason S Dew, MD;  Location: [facility];  Service: Cardiovascular;  Laterality: Right;  . PERIPHERAL VASCULAR CATHETERIZATION  03/31/2016   Procedure: Lower Extremity Intervention;  Surgeon: Jason S Dew, MD;  Location: [facility];  Service: Cardiovascular;;  . PERIPHERAL VASCULAR CATHETERIZATION Left 04/10/2016   Procedure:  Lower Extremity Angiography;  Surgeon: Jason S Dew, MD;  Location: [facility];  Service: Cardiovascular;  Laterality: Left;  . VACUUM ASSISTED CLOSURE CHANGE Left 10/03/2019   Procedure: LEFT THIGH VACUUM ASSISTED CLOSURE CHANGE;  Surgeon: Dew, Jason S, MD;  Location: [facility];  Service: General;  Laterality: Left;  . WOUND DEBRIDEMENT Left 10/18/2015   Procedure: DEBRIDEMENT WOUND   ( LEFT BKA DEBRIDEMENT );  Surgeon: Jason S Dew, MD;  Location: [facility];  Service: Vascular;  Laterality: Left;    Prior to Admission medications   Medication Sig Start Date End Date Taking? Authorizing Provider  acidophilus (RISAQUAD) CAPS capsule Take 1 capsule by mouth 2 (two) times daily.   Yes [provider]  ascorbic acid (VITAMIN C) 500 MG tablet Take 500 mg by mouth 2 (two) times daily.   Yes [provider]  aspirin EC 81 MG tablet Take 1 tablet (81 mg total) by mouth daily. 10/04/18  Yes Dew, Jason S, MD  baclofen (LIORESAL) 10 MG tablet Take 10 mg by mouth 3 (three) times daily. Hold for sedation   Yes [provider]  ELIQUIS 5 MG TABS tablet Take 5 mg by mouth 2 (two) times daily.  07/01/18  Yes [provider]  ferrous sulfate 324 (65 Fe) MG TBEC Take 324 mg by mouth 2 (two) times daily.    Yes [provider]  furosemide (LASIX) 40 MG tablet Take 1 tablet (40 mg total) by mouth daily. 10/15/17  Yes Wieting, Richard, MD  Omega-3 Fatty Acids (FISH OIL) 1000 MG CAPS Take 1,000 mg by mouth daily.    Yes [provider]  potassium chloride SA (K-DUR,KLOR-CON) 20 MEQ tablet Take 20 mEq by mouth daily. With or after meal 10/15/17  Yes [provider]  tamsulosin (FLOMAX) 0.4 MG CAPS capsule Take 1 capsule (0.4 mg total) by mouth daily. 10/06/19  Yes Shah, Vipul, MD  TRELEGY ELLIPTA 100-62.5-25 MCG/INH AEPB Inhale 1 puff into the lungs every morning.  09/24/18  Yes [provider]  acetaminophen (TYLENOL) 325 MG tablet Take 650 mg by  mouth every 4 (four) hours as needed for fever.     [provider]  ammonium lactate (AMLACTIN) 12 % cream Apply topically 2 (two) times daily. Apply to right foot Patient not taking: Reported on 04/27/2020    [provider]  atorvastatin (LIPITOR) 10 MG tablet Take 1 tablet (10 mg total) by mouth daily. Patient taking differently: Take 10 mg by mouth at bedtime.  10/04/18 11/22/19  Dew, Jason S, MD  brimonidine-timolol (COMBIGAN) 0.2-0.5 % ophthalmic solution Place 1 drop into the right eye 2 (two) times daily. Patient not taking: Reported on 04/27/2020    [provider]  ciclopirox (PENLAC) 8 % solution Apply 1 application topically daily. Apply solutions to toenails Patient not taking: Reported on 04/27/2020    [provider]  Difluprednate (DUREZOL) 0.05 % EMUL Place 1 drop into the right eye 3 (three) times daily.  Patient not taking: Reported on   04/27/2020    [provider]  Difluprednate (DUREZOL) 0.05 % EMUL Apply 1 drop to eye daily. Patient not taking: Reported on 04/27/2020    [provider]  docusate sodium (COLACE) 100 MG capsule Take 100 mg by mouth daily as needed for mild constipation.    [provider]  fentaNYL (DURAGESIC) 25 MCG/HR Place 1 patch onto the skin every 3 (three) days.  10/13/19   [provider]  gabapentin (NEURONTIN) 100 MG capsule Take 100 mg by mouth at bedtime.     [provider]  guaiFENesin (ROBITUSSIN) 100 MG/5ML SOLN Take 15 mLs by mouth 3 (three) times daily as needed for cough (congestion).     [provider]  loperamide (IMODIUM) 2 MG capsule Take 2 mg by mouth 3 (three) times daily as needed for diarrhea or loose stools.    [provider]  Melatonin 5 MG TABS Take 5 mg by mouth at bedtime as needed (insomnia).     [provider]  Morphine Sulfate (MORPHINE CONCENTRATE) 10 mg / 0.5 ml concentrated solution  10/13/19   [provider]    nitroGLYCERIN (NITROSTAT) 0.4 MG SL tablet Place 0.14 mg under the tongue every 5 (five) minutes as needed for chest pain.    [provider]  Oxycodone HCl 10 MG TABS Take 10 mg by mouth every 4 (four) hours as needed (pain).     [provider]  oxymetazoline (AFRIN) 0.05 % nasal spray Place 2 sprays into both nostrils 2 (two) times daily as needed (For nose bleeds).     [provider]  sodium chloride (OCEAN) 0.65 % SOLN nasal spray Place 2 sprays into both nostrils 2 (two) times daily as needed for congestion.    [provider]  triamcinolone cream (KENALOG) 0.1 % Apply 1 application topically 2 (two) times daily. Patient not taking: Reported on 04/27/2020    [provider]    Current Facility-Administered Medications:  .  0.9 %  sodium chloride infusion, , Intravenous, Continuous, Vanga, Rohini Reddy, MD, Last Rate: 20 mL/hr at 04/27/20 1146, Restarted at 04/27/20 1233 .  [MAR Hold] acetaminophen (TYLENOL) tablet 650 mg, 650 mg, Oral, Q6H PRN **OR** [MAR Hold] acetaminophen (TYLENOL) suppository 650 mg, 650 mg, Rectal, Q6H PRN, Niu, Xilin, MD .  [MAR Hold] albuterol (PROVENTIL) (2.5 MG/3ML) 0.083% nebulizer solution 2.5 mg, 2.5 mg, Nebulization, Q4H PRN, Niu, Xilin, MD .  [MAR Hold] hydrALAZINE (APRESOLINE) injection 5 mg, 5 mg, Intravenous, Q2H PRN, Niu, Xilin, MD .  [MAR Hold] LORazepam (ATIVAN) injection 0-4 mg, 0-4 mg, Intravenous, Q6H, Niu, Xilin, MD .  [MAR Hold] morphine 2 MG/ML injection 1 mg, 1 mg, Intravenous, Q4H PRN, Niu, Xilin, MD .  [MAR Hold] ondansetron (ZOFRAN) injection 4 mg, 4 mg, Intravenous, Q8H PRN, Niu, Xilin, MD .  pantoprazole (PROTONIX) 80 mg in sodium chloride 0.9 % 100 mL (0.8 mg/mL) infusion, 8 mg/hr, Intravenous, Continuous, Niu, Xilin, MD, Last Rate: 10 mL/hr at 04/27/20 0648, 8 mg/hr at 04/27/20 0648 .  [MAR Hold] thiamine (B-1) injection 100 mg, 100 mg, Intravenous, Daily, Niu, Xilin, MD  Facility-Administered  Medications Ordered in Other Encounters:  .  lidocaine (cardiac) 100 mg/5mL (XYLOCAINE) injection 2%, , Intravenous, Anesthesia Intra-op, Laxton, Rhonda Marie, CRNA, 100 mg at 04/27/20 1152 .  phenylephrine (NEO-SYNEPHRINE) injection, , Intravenous, Anesthesia Intra-op, Laxton, Rhonda Marie, CRNA, 100 mcg at 04/27/20 1219 .  propofol (DIPRIVAN) 10 mg/mL bolus/IV push, , Intravenous, Anesthesia Intra-op, Laxton, Rhonda Marie, CRNA,   100 mg at 04/27/20 1230 .  succinylcholine (ANECTINE) injection, , Intravenous, Anesthesia Intra-op, Lerry Liner, CRNA, 100 mg at 04/27/20 1152  Family History  Problem Relation Age of Onset  . Heart attack Mother   . Varicose Veins Neg Hx      Social History   Tobacco Use  . Smoking status: Former Smoker    Packs/day: 1.00    Years: 44.00    Pack years: 44.00    Types: Cigarettes    Quit date: 11/17/2012    Years since quitting: 7.4  . Smokeless tobacco: Never Used  Vaping Use  . Vaping Use: Never used  Substance Use Topics  . Alcohol use: Yes  . Drug use: No    Allergies as of 04/26/2020  . (No Known Allergies)    Review of Systems:    All systems reviewed and negative except where noted in HPI.   Physical Exam:  Vital signs in last 24 hours: Temp:  [97.8 F (36.6 C)-98.7 F (37.1 C)] 97.8 F (36.6 C) (09/10 1125) Pulse Rate:  [99-116] 116 (09/10 1125) Resp:  [16-20] 20 (09/10 1125) BP: (107-141)/(61-86) 119/70 (09/10 1125) SpO2:  [91 %-98 %] 95 % (09/10 1125) Weight:  [100.7 kg] 100.7 kg (09/10 1125)   General:   Pleasant, cooperative in NAD Head:  Normocephalic and atraumatic. Eyes:   No icterus.   Conjunctiva pink. PERRLA. Ears:  Normal auditory acuity. Neck:  Supple; no masses or thyroidomegaly Lungs: Respirations even and unlabored. Lungs clear to auscultation bilaterally.   No wheezes, crackles, or rhonchi.  Heart:  regular rate and rhythm;  Without murmur, clicks, rubs or gallops Abdomen:  Soft, nondistended,  nontender. Normal bowel sounds. No appreciable masses or hepatomegaly.  No rebound or guarding.  Rectal:  Not performed. Msk:  Symmetrical without gross deformities.  Strength normal Extremities:  Without edema, cyanosis or clubbing. Neurologic:  Alert and oriented x3;  grossly normal neurologically. Skin:  Intact without significant lesions or rashes. Psych:  Alert and cooperative. Normal affect.  LAB RESULTS: CBC Latest Ref Rng & Units 04/26/2020 10/07/2019 10/07/2019  WBC 4.0 - 10.5 K/uL 23.5(H) - 15.7(H)  Hemoglobin 13.0 - 17.0 g/dL 16.2 7.9(L) 7.6(L)  Hematocrit 39 - 52 % 48.1 - 25.3(L)  Platelets 150 - 400 K/uL 279 - 358    BMET BMP Latest Ref Rng & Units 04/26/2020 10/07/2019 10/06/2019  Glucose 70 - 99 mg/dL 136(H) 96 101(H)  BUN 8 - 23 mg/dL _0 Creatinine 0.61 - 1.24 mg/dL 1.14 0.84 0.88  Sodium 135 - 145 mmol/L 140 143 144  Potassium 3.5 - 5.1 mmol/L 3.8 3.6 3.1(L)  Chloride 98 - 111 mmol/L 103 114(H) 113(H)  CO2 22 - 32 mmol/L _1 Calcium 8.9 - 10.3 mg/dL 8.9 7.6(L) 7.7(L)    LFT Hepatic Function Latest Ref Rng & Units 04/26/2020 09/27/2019 01/02/2019  Total Protein 6.5 - 8.1 g/dL 8.2(H) 7.6 5.8(L)  Albumin 3.5 - 5.0 g/dL 4.6 2.8(L) 2.5(L)  AST 15 - 41 U/L _2 ALT 0 - 44 U/L _3 Alk Phosphatase 38 - 126 U/L 113 122 54  Total Bilirubin 0.3 - 1.2 mg/dL 1.5(H) 0.9 0.7     STUDIES: DG Chest 2 View  Result Date: 04/27/2020 CLINICAL DATA:  Vomiting blood and esophageal pain with swallowing. EXAM: CHEST - 2 VIEW COMPARISON:  10/01/2019 FINDINGS: Shallow inspiration. Heart size and pulmonary vascularity are normal. Scarring and volume loss in the left  upper lung is unchanged. No airspace disease or consolidation. No pleural effusions. No pneumothorax. Mediastinal contours are unchanged with suggestion of paratracheal and pleural thickening in the left upper lung. IMPRESSION: No evidence of active pulmonary disease. Chronic scarring and pleural changes in the  left apex. Electronically Signed   By: William  Stevens M.D.   On: 04/27/2020 00:10   CT Chest Wo Contrast  Result Date: 04/27/2020 CLINICAL DATA:  Food bolus versus mass. EXAM: CT CHEST WITHOUT CONTRAST TECHNIQUE: Multidetector CT imaging of the chest was performed following the standard protocol without IV contrast. COMPARISON:  Abdominal CT from earlier today FINDINGS: Cardiovascular: Normal heart size. No pericardial effusion. Diffuse atherosclerotic calcification of the coronaries and also of the aorta. Mediastinum/Nodes: Midesophageal low-density appearance on prior CT correlates with frothy material distending the mid esophagus. No discrete mass is seen, although limited without contrast. Prominence of the subcarinal lymph node but no definite adenopathy. Anticipate upcoming EGD. Lungs/Pleura: Emphysema with areas of fibrosis and subpleural honeycombing. There is a large bulla involving the right anterior paramediastinal space with mass effect on mediastinal structures. Upper Abdomen: Reported on prior study Musculoskeletal: Unremarkable. IMPRESSION: 1. Distended midesophagus by semi solid appearing material, most likely food bolus. A distal stricture is implied. A lesion could easily be obscured and there is history of hematemesis, recommend GI follow-up. 2. Emphysema with superimposed fibrotic areas on the left. 3. Aortic Atherosclerosis (ICD10-I70.0) and Emphysema (ICD10-J43.9). Electronically Signed   By: Jonathon  Watts M.D.   On: 04/27/2020 07:57   CT Abdomen Pelvis W Contrast  Result Date: 04/27/2020 CLINICAL DATA:  Hematemesis and pain, initial encounter EXAM: CT ABDOMEN AND PELVIS WITH CONTRAST TECHNIQUE: Multidetector CT imaging of the abdomen and pelvis was performed using the standard protocol following bolus administration of intravenous contrast. CONTRAST:  80mL OMNIPAQUE IOHEXOL 300 MG/ML  SOLN COMPARISON:  10/11/2017 FINDINGS: Lower chest: Emphysematous changes and scarring are seen in  the bases bilateral. Hepatobiliary: No focal liver abnormality is seen. No gallstones, gallbladder wall thickening, or biliary dilatation. Pancreas: Unremarkable. No pancreatic ductal dilatation or surrounding inflammatory changes. Spleen: Normal in size without focal abnormality. Adrenals/Urinary Tract: Adrenal glands are within normal limits. Kidneys demonstrate a normal enhancement pattern bilaterally. Delayed images demonstrate normal excretion. Left renal cyst is again noted centrally. No obstructive or inflammatory changes are seen. The bladder is well distended. Stomach/Bowel: Scattered diverticular change of the colon is noted. The appendix is within normal limits. Small bowel and stomach are within normal limits as well. Within the mid to distal esophagus there is a central area of decreased attenuation identified. This is incompletely evaluated on this exam. CT of the chest may be helpful. Alternate barium swallow may be helpful. Correlate with any possible retained food bolus. Vascular/Lymphatic: Aortic atherosclerosis. No enlarged abdominal or pelvic lymph nodes. IVC filter is noted in place. Vascular stents are noted in the iliac arteries bilaterally. Reproductive: Prostate is unremarkable. Other: No abdominal wall hernia or abnormality. No abdominopelvic ascites. Musculoskeletal: Degenerative changes of lumbar spine are noted. IMPRESSION: Area of decreased attenuation identified in the mid esophagus incompletely evaluated on this exam. This may represent a retained food bolus or possible mass. CT of the chest may be helpful for further evaluation. Alternatively barium swallow could be performed as clinically indicated. Diverticulosis without diverticulitis. Electronically Signed   By: Mark  Lukens M.D.   On: 04/27/2020 06:43      Impression / Plan:   Minor L Bova is a 69 y.o. male with hypertension, COPD, PE/DVT   on Eliquis presented from [facility] with hematemesis and CT chest revealed  possible food bolus versus mass in the distal esophagus  Hematemesis: Patient is hemodynamically stable, his hemoglobin is at best compared to last several months Continue Protonix Repeat p.m. CBC Continue to hold Eliquis Recommend EGD for further evaluation  Food bolus versus mass in distal esophagus based on imaging Recommend urgent endoscopic evaluation even though patient is on Eliquis  Unexplained leukocytosis, likely stress response No source of infection identified so far Blood cultures in process COVID-19 screening negative  Thank you for involving me in the care of this patient.      LOS: 0 days   Sherri Sear, MD  04/27/2020, 12:45 PM   Note: This dictation was prepared with Dragon dictation along with smaller phrase technology. Any transcriptional errors that result from this process are unintentional.

## 2020-04-28 DIAGNOSIS — K920 Hematemesis: Secondary | ICD-10-CM | POA: Diagnosis present

## 2020-04-28 DIAGNOSIS — Z7289 Other problems related to lifestyle: Secondary | ICD-10-CM | POA: Diagnosis not present

## 2020-04-28 DIAGNOSIS — D72829 Elevated white blood cell count, unspecified: Secondary | ICD-10-CM | POA: Diagnosis present

## 2020-04-28 DIAGNOSIS — Z89612 Acquired absence of left leg above knee: Secondary | ICD-10-CM | POA: Diagnosis not present

## 2020-04-28 DIAGNOSIS — E870 Hyperosmolality and hypernatremia: Secondary | ICD-10-CM

## 2020-04-28 DIAGNOSIS — I82502 Chronic embolism and thrombosis of unspecified deep veins of left lower extremity: Secondary | ICD-10-CM | POA: Diagnosis present

## 2020-04-28 DIAGNOSIS — I10 Essential (primary) hypertension: Secondary | ICD-10-CM | POA: Diagnosis not present

## 2020-04-28 DIAGNOSIS — Z7901 Long term (current) use of anticoagulants: Secondary | ICD-10-CM | POA: Diagnosis not present

## 2020-04-28 DIAGNOSIS — I5032 Chronic diastolic (congestive) heart failure: Secondary | ICD-10-CM | POA: Diagnosis present

## 2020-04-28 DIAGNOSIS — Z20822 Contact with and (suspected) exposure to covid-19: Secondary | ICD-10-CM | POA: Diagnosis present

## 2020-04-28 DIAGNOSIS — I739 Peripheral vascular disease, unspecified: Secondary | ICD-10-CM | POA: Diagnosis present

## 2020-04-28 DIAGNOSIS — K2211 Ulcer of esophagus with bleeding: Secondary | ICD-10-CM | POA: Diagnosis present

## 2020-04-28 DIAGNOSIS — Z8249 Family history of ischemic heart disease and other diseases of the circulatory system: Secondary | ICD-10-CM | POA: Diagnosis not present

## 2020-04-28 DIAGNOSIS — J449 Chronic obstructive pulmonary disease, unspecified: Secondary | ICD-10-CM | POA: Diagnosis present

## 2020-04-28 DIAGNOSIS — I110 Hypertensive heart disease with heart failure: Secondary | ICD-10-CM | POA: Diagnosis present

## 2020-04-28 DIAGNOSIS — E785 Hyperlipidemia, unspecified: Secondary | ICD-10-CM | POA: Diagnosis present

## 2020-04-28 DIAGNOSIS — K449 Diaphragmatic hernia without obstruction or gangrene: Secondary | ICD-10-CM | POA: Diagnosis present

## 2020-04-28 DIAGNOSIS — F1010 Alcohol abuse, uncomplicated: Secondary | ICD-10-CM | POA: Diagnosis present

## 2020-04-28 DIAGNOSIS — Z87891 Personal history of nicotine dependence: Secondary | ICD-10-CM | POA: Diagnosis not present

## 2020-04-28 LAB — BASIC METABOLIC PANEL
Anion gap: 6 (ref 5–15)
BUN: 53 mg/dL — ABNORMAL HIGH (ref 8–23)
CO2: 21 mmol/L — ABNORMAL LOW (ref 22–32)
Calcium: 8 mg/dL — ABNORMAL LOW (ref 8.9–10.3)
Chloride: 119 mmol/L — ABNORMAL HIGH (ref 98–111)
Creatinine, Ser: 1.25 mg/dL — ABNORMAL HIGH (ref 0.61–1.24)
GFR calc Af Amer: >60 mL/min (ref >60)
GFR calc non Af Amer: 58 mL/min — ABNORMAL LOW (ref >60)
Glucose, Bld: 121 mg/dL — ABNORMAL HIGH (ref 70–99)
Potassium: 3.8 mmol/L (ref 3.5–5.1)
Sodium: 146 mmol/L — ABNORMAL HIGH (ref 135–145)

## 2020-04-28 LAB — CBC
HCT: 30.1 % — ABNORMAL LOW (ref 39.0–52.0)
HCT: 30 % — ABNORMAL LOW (ref 39.0–52.0)
HCT: 29.8 % — ABNORMAL LOW (ref 39.0–52.0)
HCT: 30.4 % — ABNORMAL LOW (ref 39.0–52.0)
Hemoglobin: 10.2 g/dL — ABNORMAL LOW (ref 13.0–17.0)
Hemoglobin: 10.1 g/dL — ABNORMAL LOW (ref 13.0–17.0)
Hemoglobin: 9.5 g/dL — ABNORMAL LOW (ref 13.0–17.0)
Hemoglobin: 10.4 g/dL — ABNORMAL LOW (ref 13.0–17.0)
MCH: 28.1 pg (ref 26.0–34.0)
MCH: 28.1 pg (ref 26.0–34.0)
MCH: 27.8 pg (ref 26.0–34.0)
MCH: 28.5 pg (ref 26.0–34.0)
MCHC: 33.9 g/dL (ref 30.0–36.0)
MCHC: 33.7 g/dL (ref 30.0–36.0)
MCHC: 31.9 g/dL (ref 30.0–36.0)
MCHC: 34.2 g/dL (ref 30.0–36.0)
MCV: 82.9 fL (ref 80.0–100.0)
MCV: 83.6 fL (ref 80.0–100.0)
MCV: 87.1 fL (ref 80.0–100.0)
MCV: 83.3 fL (ref 80.0–100.0)
Platelets: 180 10*3/uL (ref 150–400)
Platelets: 192 10*3/uL (ref 150–400)
Platelets: 176 10*3/uL (ref 150–400)
Platelets: 158 10*3/uL (ref 150–400)
RBC: 3.63 MIL/uL — ABNORMAL LOW (ref 4.22–5.81)
RBC: 3.59 MIL/uL — ABNORMAL LOW (ref 4.22–5.81)
RBC: 3.42 MIL/uL — ABNORMAL LOW (ref 4.22–5.81)
RBC: 3.65 MIL/uL — ABNORMAL LOW (ref 4.22–5.81)
RDW: 16.5 % — ABNORMAL HIGH (ref 11.5–15.5)
RDW: 16.7 % — ABNORMAL HIGH (ref 11.5–15.5)
RDW: 16.9 % — ABNORMAL HIGH (ref 11.5–15.5)
RDW: 16.8 % — ABNORMAL HIGH (ref 11.5–15.5)
WBC: 18.3 10*3/uL — ABNORMAL HIGH (ref 4.0–10.5)
WBC: 18.7 10*3/uL — ABNORMAL HIGH (ref 4.0–10.5)
WBC: 18.7 10*3/uL — ABNORMAL HIGH (ref 4.0–10.5)
WBC: 20.2 10*3/uL — ABNORMAL HIGH (ref 4.0–10.5)
nRBC: 0 % (ref 0.0–0.2)
nRBC: 0 % (ref 0.0–0.2)
nRBC: 0 % (ref 0.0–0.2)
nRBC: 0 % (ref 0.0–0.2)

## 2020-04-28 LAB — GLUCOSE, CAPILLARY
Glucose-Capillary: 111 mg/dL — ABNORMAL HIGH (ref 70–99)
Glucose-Capillary: 118 mg/dL — ABNORMAL HIGH (ref 70–99)
Glucose-Capillary: 114 mg/dL — ABNORMAL HIGH (ref 70–99)
Glucose-Capillary: 106 mg/dL — ABNORMAL HIGH (ref 70–99)

## 2020-04-28 LAB — HIV ANTIBODY (ROUTINE TESTING W REFLEX): HIV Screen 4th Generation wRfx: NONREACTIVE

## 2020-04-28 MED ORDER — DEXTROSE 5 % IV SOLN: INTRAVENOUS | Status: DC

## 2020-04-28 NOTE — Progress Notes (Addendum)
PROGRESS NOTE    Timothy Juarez  PNT:[id number] DOB: 10-05-1950 DOA: 04/27/2020 PCP: Alvester Morin, MD   Assessment & Plan:   Principal Problem:   Hematemesis Active Problems:   Hyperlipidemia   Essential hypertension   Chronic deep vein thrombosis (DVT) of left lower extremity (HCC)   COPD with chronic bronchitis (HCC)   Chronic diastolic CHF (congestive heart failure) (HCC)   Leukocytosis   Alcohol use  Hematemesis: secondary to esophageal ulcers w/ stigmata of recent bleeding. NPO. Continue on IV protonix for 3 days as per GI  Alcohol abuse: continue on CIWA protocol. Alcohol cessation counseling   Hypernatremia: free water deficit is 0.6L. Will change IVFs to D5W  HLD: continue on atorvastatin    HTN: hold home dose of lasix while NPO. IV hydralazine prn   Chronic DVT of [name]: continue to hold home dose of eliquis  COPD: w/o exacerbation. Continue on bronchodilators   Chronic diastolic CHF: echos showed a EF of 60-65%. Seems euvolemic. Will hold home dose of lasix while NPO   Leukocytosis: etiology unclear, reactive vs infection. Blood cxs NGTD. Will continue to monitor      DVT prophylaxis: SCDs secondary to hematemesis  Code Status: full  Family Communication:  Disposition Plan: depends on PT/OT recs   Consultants:   GI    Procedures:   Antimicrobials:   Subjective: Pt c/o being thirsty and hungry.   Objective: Vitals:   04/27/20 2020 04/28/20 0053 04/28/20 0411 04/28/20 0500  BP: 115/63 105/61 (!) 92/58   Pulse: (!) 103 93 95   Resp:  20 16   Temp: 99.2 F (37.3 C) 98.2 F (36.8 C) 98.3 F (36.8 C)   TempSrc: Oral Oral Oral   SpO2: 95% 94% 97%   Weight:    100.7 kg  Height:        Intake/Output Summary (Last 24 hours) at 04/28/2020 0726 Last data filed at 04/28/2020 0529 Gross per 24 hour  Intake 1777.59 ml  Output 900 ml  Net 877.59 ml   Filed Weights   04/26/20 2155 04/27/20 1125 04/28/20 0500  Weight: 100.7 kg  100.7 kg 100.7 kg    Examination:  General exam: Appears agitated  Respiratory system: Clear to auscultation. Respiratory effort normal. Cardiovascular system: S1 & S2+. No rubs, gallops or clicks.  Gastrointestinal system: Abdomen is nondistended, soft and nontender. Hypoactive bowel sounds heard. Central nervous system: Alert and oriented.  Psychiatry: Judgement and insight appear normal. Agitated and frustrated     Data Reviewed: I have personally reviewed following labs and imaging studies  CBC: Recent Labs  Lab 04/26/20 2150 04/27/20 1354 04/27/20 2100 04/28/20 0322  WBC 23.5* 21.8* 19.4* 18.3*  NEUTROABS 19.9*  --   --   --   HGB 16.2 12.6* 11.0* 10.2*  HCT 48.1 40.1 33.7* 30.1*  MCV 82.2 87.9 84.9 82.9  PLT 279 238 209 086   Basic Metabolic Panel: Recent Labs  Lab 04/26/20 2150 04/28/20 0322  NA 140 146*  K 3.8 3.8  CL 103 119*  CO2 26 21*  GLUCOSE 136* 121*  BUN 21 53*  CREATININE 1.14 1.25*  CALCIUM 8.9 8.0*   GFR: Estimated Creatinine Clearance: 69.6 mL/min (A) (by C-G formula based on SCr of 1.25 mg/dL (H)). Liver Function Tests: Recent Labs  Lab 04/26/20 2150  AST 23  ALT 22  ALKPHOS 113  BILITOT 1.5*  PROT 8.2*  ALBUMIN 4.6   Recent Labs  Lab 04/26/20 2150  LIPASE 21  No results for input(s): AMMONIA in the last 168 hours. Coagulation Profile: Recent Labs  Lab 04/27/20 1501  INR 1.2   Cardiac Enzymes: No results for input(s): CKTOTAL, CKMB, CKMBINDEX, TROPONINI in the last 168 hours. BNP (last 3 results) No results for input(s): PROBNP in the last 8760 hours. HbA1C: No results for input(s): HGBA1C in the last 72 hours. CBG: No results for input(s): GLUCAP in the last 168 hours. Lipid Profile: No results for input(s): CHOL, HDL, LDLCALC, TRIG, CHOLHDL, LDLDIRECT in the last 72 hours. Thyroid Function Tests: No results for input(s): TSH, T4TOTAL, FREET4, T3FREE, THYROIDAB in the last 72 hours. Anemia Panel: No results for  input(s): VITAMINB12, FOLATE, FERRITIN, TIBC, IRON, RETICCTPCT in the last 72 hours. Sepsis Labs: Recent Labs  Lab 04/27/20 0452 04/27/20 0618 04/27/20 1501  PROCALCITON 0.26  --   --   LATICACIDVEN  --  1.8 2.0*    Recent Results (from the past 240 hour(s))  SARS Coronavirus 2 by RT PCR (hospital order, performed in [facility] hospital lab) Nasopharyngeal Nasopharyngeal Swab     Status: None   Collection Time: 04/27/20  6:18 AM   Specimen: Nasopharyngeal Swab  Result Value Ref Range Status   SARS Coronavirus 2 NEGATIVE NEGATIVE Final    Comment: (NOTE) SARS-CoV-2 target nucleic acids are NOT DETECTED.  The SARS-CoV-2 RNA is generally detectable in upper and lower respiratory specimens during the acute phase of infection. The lowest concentration of SARS-CoV-2 viral copies this assay can detect is 250 copies / mL. A negative result does not preclude SARS-CoV-2 infection and should not be used as the sole basis for treatment or other patient management decisions.  A negative result may occur with improper specimen collection / handling, submission of specimen other than nasopharyngeal swab, presence of viral mutation(s) within the areas targeted by this assay, and inadequate number of viral copies (<250 copies / mL). A negative result must be combined with clinical observations, patient history, and epidemiological information.  Fact Sheet for Patients:   StrictlyIdeas.no  Fact Sheet for Healthcare Providers: BankingDealers.co.za  This test is not yet approved or  cleared by the [place] FDA and has been authorized for detection and/or diagnosis of SARS-CoV-2 by FDA under an Emergency Use Authorization (EUA).  This EUA will remain in effect (meaning this test can be used) for the duration of the COVID-19 declaration under Section 564(b)(1) of the Act, 21 U.S.C. section 360bbb-3(b)(1), unless the authorization is terminated  or revoked sooner.  Performed at [facility], [address] [place], [place] [address]          Radiology Studies: DG Chest 2 View  Result Date: 04/27/2020 CLINICAL DATA:  Vomiting blood and esophageal pain with swallowing. EXAM: CHEST - 2 VIEW COMPARISON:  10/01/2019 FINDINGS: Shallow inspiration. Heart size and pulmonary vascularity are normal. Scarring and volume loss in the left upper lung is unchanged. No airspace disease or consolidation. No pleural effusions. No pneumothorax. Mediastinal contours are unchanged with suggestion of paratracheal and pleural thickening in the left upper lung. IMPRESSION: No evidence of active pulmonary disease. Chronic scarring and pleural changes in the left apex. Electronically Signed   By: Lucienne Capers M.D.   On: 04/27/2020 00:10   CT Chest Wo Contrast  Result Date: 04/27/2020 CLINICAL DATA:  Food bolus versus mass. EXAM: CT CHEST WITHOUT CONTRAST TECHNIQUE: Multidetector CT imaging of the chest was performed following the standard protocol without IV contrast. COMPARISON:  Abdominal CT from earlier today FINDINGS:  Cardiovascular: Normal heart size. No pericardial effusion. Diffuse atherosclerotic calcification of the coronaries and also of the aorta. Mediastinum/Nodes: Midesophageal low-density appearance on prior CT correlates with frothy material distending the mid esophagus. No discrete mass is seen, although limited without contrast. Prominence of the subcarinal lymph node but no definite adenopathy. Anticipate upcoming EGD. Lungs/Pleura: Emphysema with areas of fibrosis and subpleural honeycombing. There is a large bulla involving the right anterior paramediastinal space with mass effect on mediastinal structures. Upper Abdomen: Reported on prior study Musculoskeletal: Unremarkable. IMPRESSION: 1. Distended midesophagus by semi solid appearing material, most likely food bolus. A distal stricture is implied. A lesion could easily be  obscured and there is history of hematemesis, recommend GI follow-up. 2. Emphysema with superimposed fibrotic areas on the left. 3. Aortic Atherosclerosis (ICD10-I70.0) and Emphysema (ICD10-J43.9). Electronically Signed   By: Monte Fantasia M.D.   On: 04/27/2020 07:57   CT Abdomen Pelvis W Contrast  Result Date: 04/27/2020 CLINICAL DATA:  Hematemesis and pain, initial encounter EXAM: CT ABDOMEN AND PELVIS WITH CONTRAST TECHNIQUE: Multidetector CT imaging of the abdomen and pelvis was performed using the standard protocol following bolus administration of intravenous contrast. CONTRAST:  17mL OMNIPAQUE IOHEXOL 300 MG/ML  SOLN COMPARISON:  10/11/2017 FINDINGS: Lower chest: Emphysematous changes and scarring are seen in the bases bilateral. Hepatobiliary: No focal liver abnormality is seen. No gallstones, gallbladder wall thickening, or biliary dilatation. Pancreas: Unremarkable. No pancreatic ductal dilatation or surrounding inflammatory changes. Spleen: Normal in size without focal abnormality. Adrenals/Urinary Tract: Adrenal glands are within normal limits. Kidneys demonstrate a normal enhancement pattern bilaterally. Delayed images demonstrate normal excretion. Left renal cyst is again noted centrally. No obstructive or inflammatory changes are seen. The bladder is well distended. Stomach/Bowel: Scattered diverticular change of the colon is noted. The appendix is within normal limits. Small bowel and stomach are within normal limits as well. Within the mid to distal esophagus there is a central area of decreased attenuation identified. This is incompletely evaluated on this exam. CT of the chest may be helpful. Alternate barium swallow may be helpful. Correlate with any possible retained food bolus. Vascular/Lymphatic: Aortic atherosclerosis. No enlarged abdominal or pelvic lymph nodes. IVC filter is noted in place. Vascular stents are noted in the iliac arteries bilaterally. Reproductive: Prostate is  unremarkable. Other: No abdominal wall hernia or abnormality. No abdominopelvic ascites. Musculoskeletal: Degenerative changes of lumbar spine are noted. IMPRESSION: Area of decreased attenuation identified in the mid esophagus incompletely evaluated on this exam. This may represent a retained food bolus or possible mass. CT of the chest may be helpful for further evaluation. Alternatively barium swallow could be performed as clinically indicated. Diverticulosis without diverticulitis. Electronically Signed   By: Inez Catalina M.D.   On: 04/27/2020 06:43        Scheduled Meds: . acidophilus  1 capsule Oral BID  . ascorbic acid  500 mg Oral BID  . atorvastatin  10 mg Oral QHS  . baclofen  10 mg Oral TID  . fentaNYL  1 patch Transdermal Q72H  . ferrous sulfate  324 mg Oral BID  . fluticasone furoate-vilanterol  1 puff Inhalation Daily   And  . umeclidinium bromide  1 puff Inhalation Daily  . furosemide  40 mg Oral Daily  . gabapentin  100 mg Oral QHS  . LORazepam  0-4 mg Intravenous Q6H  . omega-3 acid ethyl esters  1 g Oral Daily  . tamsulosin  0.4 mg Oral Daily  . thiamine  100 mg  Intravenous Daily   Continuous Infusions: . sodium chloride 75 mL/hr at 04/28/20 0531  . pantoprozole (PROTONIX) infusion 8 mg/hr (04/28/20 0529)     LOS: 0 days    Time spent: 32 mins     Wyvonnia Dusky, MD [facility] Pager [phone]  If 7PM-7AM, please contact night-coverage [email or web address] 04/28/2020, 7:26 AM

## 2020-04-28 NOTE — Progress Notes (Signed)
Timothy Antigua, MD [address] [place], [place], [place], [address] [address], [place], [place], [place], [address] Phone: [phone]  Fax: [phone]   Subjective:  Patient denies any further bleeding since his procedure.  Laying in bed comfortably watching TV.  No abdominal pain.  Objective: Exam: Vital signs in last 24 hours: Vitals:   04/27/20 2020 04/28/20 0053 04/28/20 0411 04/28/20 0500  BP: 115/63 105/61 (!) 92/58   Pulse: (!) 103 93 95   Resp:  20 16   Temp: 99.2 F (37.3 C) 98.2 F (36.8 C) 98.3 F (36.8 C)   TempSrc: Oral Oral Oral   SpO2: 95% 94% 97%   Weight:    100.7 kg  Height:       Weight change: 0 kg  Intake/Output Summary (Last 24 hours) at 04/28/2020 5176 Last data filed at 04/28/2020 1607 Gross per 24 hour  Intake 1777.59 ml  Output 900 ml  Net 877.59 ml    General: No acute distress, AAO x3 Abd: Soft, NT/ND, No HSM Skin: Warm, no rashes Neck: Supple, Trachea midline   Lab Results: Lab Results  Component Value Date   WBC 18.3 (H) 04/28/2020   HGB 10.2 (L) 04/28/2020   HCT 30.1 (L) 04/28/2020   MCV 82.9 04/28/2020   PLT 180 04/28/2020   Micro Results: Recent Results (from the past 240 hour(s))  Culture, blood (routine x 2)     Status: None (Preliminary result)   Collection Time: 04/27/20  6:18 AM   Specimen: BLOOD  Result Value Ref Range Status   Specimen Description BLOOD RIGHT HAND  Final   Special Requests   Final    BOTTLES DRAWN AEROBIC AND ANAEROBIC Blood Culture adequate volume   Culture   Final    NO GROWTH 1 DAY Performed at [facility], [address] [place], [place] [address]    Report Status PENDING  Incomplete  Culture, blood (routine x 2)     Status: None (Preliminary result)   Collection Time: 04/27/20  6:18 AM   Specimen: BLOOD  Result Value Ref Range Status   Specimen Description BLOOD RIGHT WRIST  Final   Special Requests   Final    BOTTLES DRAWN AEROBIC AND ANAEROBIC Blood  Culture adequate volume   Culture   Final    NO GROWTH 1 DAY Performed at [facility], [address] [place], [place] [address]    Report Status PENDING  Incomplete  SARS Coronavirus 2 by RT PCR (hospital order, performed in [facility] hospital lab) Nasopharyngeal Nasopharyngeal Swab     Status: None   Collection Time: 04/27/20  6:18 AM   Specimen: Nasopharyngeal Swab  Result Value Ref Range Status   SARS Coronavirus 2 NEGATIVE NEGATIVE Final    Comment: (NOTE) SARS-CoV-2 target nucleic acids are NOT DETECTED.  The SARS-CoV-2 RNA is generally detectable in upper and lower respiratory specimens during the acute phase of infection. The lowest concentration of SARS-CoV-2 viral copies this assay can detect is 250 copies / mL. A negative result does not preclude SARS-CoV-2 infection and should not be used as the sole basis for treatment or other patient management decisions.  A negative result may occur with improper specimen collection / handling, submission of specimen other than nasopharyngeal swab, presence of viral mutation(s) within the areas targeted by this assay, and inadequate number of viral copies (<250 copies / mL). A negative result must be combined with clinical observations, patient history, and epidemiological information.  Fact Sheet for Patients:   StrictlyIdeas.no  Fact Sheet for Healthcare Providers: BankingDealers.co.za  This test is not yet approved or  cleared by the [place] FDA and has been authorized for detection and/or diagnosis of SARS-CoV-2 by FDA under an Emergency Use Authorization (EUA).  This EUA will remain in effect (meaning this test can be used) for the duration of the COVID-19 declaration under Section 564(b)(1) of the Act, 21 U.S.C. section 360bbb-3(b)(1), unless the authorization is terminated or revoked sooner.  Performed at [facility], [address]  [place], [place] [address]    Studies/Results: DG Chest 2 View  Result Date: 04/27/2020 CLINICAL DATA:  Vomiting blood and esophageal pain with swallowing. EXAM: CHEST - 2 VIEW COMPARISON:  10/01/2019 FINDINGS: Shallow inspiration. Heart size and pulmonary vascularity are normal. Scarring and volume loss in the left upper lung is unchanged. No airspace disease or consolidation. No pleural effusions. No pneumothorax. Mediastinal contours are unchanged with suggestion of paratracheal and pleural thickening in the left upper lung. IMPRESSION: No evidence of active pulmonary disease. Chronic scarring and pleural changes in the left apex. Electronically Signed   By: Lucienne Capers M.D.   On: 04/27/2020 00:10   CT Chest Wo Contrast  Result Date: 04/27/2020 CLINICAL DATA:  Food bolus versus mass. EXAM: CT CHEST WITHOUT CONTRAST TECHNIQUE: Multidetector CT imaging of the chest was performed following the standard protocol without IV contrast. COMPARISON:  Abdominal CT from earlier today FINDINGS: Cardiovascular: Normal heart size. No pericardial effusion. Diffuse atherosclerotic calcification of the coronaries and also of the aorta. Mediastinum/Nodes: Midesophageal low-density appearance on prior CT correlates with frothy material distending the mid esophagus. No discrete mass is seen, although limited without contrast. Prominence of the subcarinal lymph node but no definite adenopathy. Anticipate upcoming EGD. Lungs/Pleura: Emphysema with areas of fibrosis and subpleural honeycombing. There is a large bulla involving the right anterior paramediastinal space with mass effect on mediastinal structures. Upper Abdomen: Reported on prior study Musculoskeletal: Unremarkable. IMPRESSION: 1. Distended midesophagus by semi solid appearing material, most likely food bolus. A distal stricture is implied. A lesion could easily be obscured and there is history of hematemesis, recommend GI follow-up. 2. Emphysema with  superimposed fibrotic areas on the left. 3. Aortic Atherosclerosis (ICD10-I70.0) and Emphysema (ICD10-J43.9). Electronically Signed   By: Monte Fantasia M.D.   On: 04/27/2020 07:57   CT Abdomen Pelvis W Contrast  Result Date: 04/27/2020 CLINICAL DATA:  Hematemesis and pain, initial encounter EXAM: CT ABDOMEN AND PELVIS WITH CONTRAST TECHNIQUE: Multidetector CT imaging of the abdomen and pelvis was performed using the standard protocol following bolus administration of intravenous contrast. CONTRAST:  84mL OMNIPAQUE IOHEXOL 300 MG/ML  SOLN COMPARISON:  10/11/2017 FINDINGS: Lower chest: Emphysematous changes and scarring are seen in the bases bilateral. Hepatobiliary: No focal liver abnormality is seen. No gallstones, gallbladder wall thickening, or biliary dilatation. Pancreas: Unremarkable. No pancreatic ductal dilatation or surrounding inflammatory changes. Spleen: Normal in size without focal abnormality. Adrenals/Urinary Tract: Adrenal glands are within normal limits. Kidneys demonstrate a normal enhancement pattern bilaterally. Delayed images demonstrate normal excretion. Left renal cyst is again noted centrally. No obstructive or inflammatory changes are seen. The bladder is well distended. Stomach/Bowel: Scattered diverticular change of the colon is noted. The appendix is within normal limits. Small bowel and stomach are within normal limits as well. Within the mid to distal esophagus there is a central area of decreased attenuation identified. This is incompletely evaluated on this exam. CT of the chest may  be helpful. Alternate barium swallow may be helpful. Correlate with any possible retained food bolus. Vascular/Lymphatic: Aortic atherosclerosis. No enlarged abdominal or pelvic lymph nodes. IVC filter is noted in place. Vascular stents are noted in the iliac arteries bilaterally. Reproductive: Prostate is unremarkable. Other: No abdominal wall hernia or abnormality. No abdominopelvic ascites.  Musculoskeletal: Degenerative changes of lumbar spine are noted. IMPRESSION: Area of decreased attenuation identified in the mid esophagus incompletely evaluated on this exam. This may represent a retained food bolus or possible mass. CT of the chest may be helpful for further evaluation. Alternatively barium swallow could be performed as clinically indicated. Diverticulosis without diverticulitis. Electronically Signed   By: Inez Catalina M.D.   On: 04/27/2020 06:43   Medications:  Scheduled Meds: . acidophilus  1 capsule Oral BID  . ascorbic acid  500 mg Oral BID  . atorvastatin  10 mg Oral QHS  . baclofen  10 mg Oral TID  . fentaNYL  1 patch Transdermal Q72H  . ferrous sulfate  324 mg Oral BID  . fluticasone furoate-vilanterol  1 puff Inhalation Daily   And  . umeclidinium bromide  1 puff Inhalation Daily  . furosemide  40 mg Oral Daily  . gabapentin  100 mg Oral QHS  . LORazepam  0-4 mg Intravenous Q6H  . omega-3 acid ethyl esters  1 g Oral Daily  . tamsulosin  0.4 mg Oral Daily  . thiamine  100 mg Intravenous Daily   Continuous Infusions: . sodium chloride 75 mL/hr at 04/28/20 0531  . pantoprozole (PROTONIX) infusion 8 mg/hr (04/28/20 0529)   PRN Meds:.acetaminophen **OR** acetaminophen, albuterol, docusate sodium, guaiFENesin, hydrALAZINE, loperamide, melatonin, metoCLOPramide (REGLAN) injection, morphine injection, nitroGLYCERIN, ondansetron (ZOFRAN) IV, oxyCODONE   Assessment: Principal Problem:   Hematemesis Active Problems:   Hyperlipidemia   Essential hypertension   Chronic deep vein thrombosis (DVT) of left lower extremity (HCC)   COPD with chronic bronchitis (HCC)   Chronic diastolic CHF (congestive heart failure) (HCC)   Leukocytosis   Alcohol use    Plan: Hemoglobin stable Patient hemodynamically stable PPI IV twice daily  Continue serial CBCs and transfuse PRN Avoid NSAIDs Maintain 2 large-bore IV lines Please page GI with any acute hemodynamic changes,  or signs of active GI bleeding  Antireflux measures, with head of bed elevated to 30 degrees at all times  Continue to hold Eliquis as patient is at high risk of rebleeding  If patient remains asymptomatic and does not have any further active GI bleeding, and repeat hemoglobin today remains stable, can start clear liquid diet for dinner tonight    LOS: 0 days   Timothy Antigua, MD 04/28/2020, 7:28 AM

## 2020-04-29 DIAGNOSIS — Z7289 Other problems related to lifestyle: Secondary | ICD-10-CM | POA: Diagnosis not present

## 2020-04-29 DIAGNOSIS — K920 Hematemesis: Secondary | ICD-10-CM | POA: Diagnosis not present

## 2020-04-29 DIAGNOSIS — K2211 Ulcer of esophagus with bleeding: Secondary | ICD-10-CM | POA: Diagnosis not present

## 2020-04-29 LAB — COMPREHENSIVE METABOLIC PANEL WITH GFR
ALT: 10 U/L (ref 0–44)
AST: 14 U/L — ABNORMAL LOW (ref 15–41)
Albumin: 3 g/dL — ABNORMAL LOW (ref 3.5–5.0)
Alkaline Phosphatase: 59 U/L (ref 38–126)
Anion gap: 8 (ref 5–15)
BUN: 25 mg/dL — ABNORMAL HIGH (ref 8–23)
CO2: 22 mmol/L (ref 22–32)
Calcium: 8.1 mg/dL — ABNORMAL LOW (ref 8.9–10.3)
Chloride: 115 mmol/L — ABNORMAL HIGH (ref 98–111)
Creatinine, Ser: 1.12 mg/dL (ref 0.61–1.24)
GFR calc Af Amer: >60 mL/min
GFR calc non Af Amer: >60 mL/min
Glucose, Bld: 117 mg/dL — ABNORMAL HIGH (ref 70–99)
Potassium: 3.6 mmol/L (ref 3.5–5.1)
Sodium: 145 mmol/L (ref 135–145)
Total Bilirubin: 0.9 mg/dL (ref 0.3–1.2)
Total Protein: 5.6 g/dL — ABNORMAL LOW (ref 6.5–8.1)

## 2020-04-29 LAB — GLUCOSE, CAPILLARY
Glucose-Capillary: 107 mg/dL — ABNORMAL HIGH (ref 70–99)
Glucose-Capillary: 117 mg/dL — ABNORMAL HIGH (ref 70–99)
Glucose-Capillary: 100 mg/dL — ABNORMAL HIGH (ref 70–99)

## 2020-04-29 LAB — CBC
HCT: 29 % — ABNORMAL LOW (ref 39.0–52.0)
Hemoglobin: 9.6 g/dL — ABNORMAL LOW (ref 13.0–17.0)
MCH: 28.4 pg (ref 26.0–34.0)
MCHC: 33.1 g/dL (ref 30.0–36.0)
MCV: 85.8 fL (ref 80.0–100.0)
Platelets: 173 10*3/uL (ref 150–400)
RBC: 3.38 MIL/uL — ABNORMAL LOW (ref 4.22–5.81)
RDW: 16.9 % — ABNORMAL HIGH (ref 11.5–15.5)
WBC: 14.7 10*3/uL — ABNORMAL HIGH (ref 4.0–10.5)
nRBC: 0 % (ref 0.0–0.2)

## 2020-04-29 NOTE — Progress Notes (Signed)
Mobility Specialist - Progress Note   04/29/20 1403  Mobility  Activity  (Bed exercises)  Range of Motion/Exercises Right leg;Left leg (heel slides, quad sets, straight leg raises, ankle pumps)  Level of Assistance Modified independent, requires aide device or extra time  Assistive Device None  Distance Ambulated (ft) 0 ft  Mobility Response Tolerated well  Mobility performed by Mobility specialist  $Mobility charge 1 Mobility    Pre-mobility: 85 HR, 119/54 BP, 94% SpO2 Post-mobility: 83 HR, 127/50 BP, 95% SpO2   Pt was lying in bed upon arrival. Pt agreed to session. Pt denied any pain, dizziness, nausea, or fatigue. Pt was modI in performing bed exercises: straight leg raises, hip add/abd (10x/leg), ankle pumps, quad sets, and heel slides (10x/R LE). Overall, pt tolerated session well. Pt was left in bed with phone/call bell in reach.    Kathee Delton Mobility Specialist 04/29/20, 2:09 PM

## 2020-04-29 NOTE — Progress Notes (Signed)
PROGRESS NOTE    Timothy Juarez  LHT:[id number] DOB: 08/19/1950 DOA: 04/27/2020 PCP: Alvester Morin, MD   Assessment & Plan:   Principal Problem:   Hematemesis Active Problems:   Hyperlipidemia   Essential hypertension   Chronic deep vein thrombosis (DVT) of left lower extremity (HCC)   COPD with chronic bronchitis (HCC)   Chronic diastolic CHF (congestive heart failure) (HCC)   Leukocytosis   Alcohol use   Esophageal ulcer with bleeding  Hematemesis: secondary to esophageal ulcers w/ stigmata of recent bleeding. Clear liquid diet as per GI. Continue on IV protonix for 3 days as per GI  Alcohol abuse: continue on CIWA protocol. Alcohol cessation counseling   Hypernatremia: resolved   HLD: continue on statin   HTN: hold home dose of lasix while NPO. IV hydralazine prn   Chronic DVT of [name]: continue to hold home dose of eliquis  COPD: w/o exacerbation. Continue on bronchodilators & encourage incentive spirometry   Chronic diastolic CHF: echos showed a EF of 60-65%. Seems euvolemic. Will hold home dose of lasix while NPO   Leukocytosis: etiology unclear, reactive vs infection. Blood cxs NGTD. Will continue to monitor      DVT prophylaxis: SCDs secondary to hematemesis  Code Status: full  Family Communication:  Disposition Plan: depends on PT/OT recs   Status is: Inpatient  Remains inpatient appropriate because:IV treatments appropriate due to intensity of illness or inability to take PO, still on IV protonix drip    Dispo: The patient is from: Home              Anticipated d/c is to: Home              Anticipated d/c date is: 1 day              Patient currently is not medically stable to d/c.    Consultants:   GI    Procedures:   Antimicrobials:   Subjective: Pt c/o fatigue  Objective: Vitals:   04/28/20 0500 04/28/20 1152 04/28/20 1940 04/29/20 0642  BP:  (!) 112/52 (!) 105/51 (!) 110/54  Pulse:  80 84 76  Resp:  16 18 15     Temp:  98.7 F (37.1 C) 99.1 F (37.3 C) 98.6 F (37 C)  TempSrc:   Oral Oral  SpO2:  96% 94% 92%  Weight: 100.7 kg     Height:        Intake/Output Summary (Last 24 hours) at 04/29/2020 0717 Last data filed at 04/29/2020 0644 Gross per 24 hour  Intake 896.03 ml  Output 1100 ml  Net -203.97 ml   Filed Weights   04/26/20 2155 04/27/20 1125 04/28/20 0500  Weight: 100.7 kg 100.7 kg 100.7 kg    Examination:  General exam: Appears calm and comfortable Respiratory system: clear breath sounds b/l. No rales, rhonchi  Cardiovascular system: S1 & S2+. No rubs, gallops or clicks.  Gastrointestinal system: Abdomen is nondistended, soft and nontender. Normal bowel sounds  Central nervous system: Alert and oriented. Moves extremities  Psychiatry: Judgement and insight appear normal. Flat mood and affect     Data Reviewed: I have personally reviewed following labs and imaging studies  CBC: Recent Labs  Lab 04/26/20 2150 04/27/20 1354 04/28/20 0322 04/28/20 0849 04/28/20 1601 04/28/20 2039 04/29/20 0450  WBC 23.5*   < > 18.3* 18.7* 18.7* 20.2* 14.7*  NEUTROABS 19.9*  --   --   --   --   --   --  HGB 16.2   < > 10.2* 10.1* 9.5* 10.4* 9.6*  HCT 48.1   < > 30.1* 30.0* 29.8* 30.4* 29.0*  MCV 82.2   < > 82.9 83.6 87.1 83.3 85.8  PLT 279   < > 180 192 176 158 173   < > = values in this interval not displayed.   Basic Metabolic Panel: Recent Labs  Lab 04/26/20 2150 04/28/20 0322 04/29/20 0450  NA 140 146* 145  K 3.8 3.8 3.6  CL 103 119* 115*  CO2 26 21* 22  GLUCOSE 136* 121* 117*  BUN 21 53* 25*  CREATININE 1.14 1.25* 1.12  CALCIUM 8.9 8.0* 8.1*   GFR: Estimated Creatinine Clearance: 77.7 mL/min (by C-G formula based on SCr of 1.12 mg/dL). Liver Function Tests: Recent Labs  Lab 04/26/20 2150 04/29/20 0450  AST 23 14*  ALT 22 10  ALKPHOS 113 59  BILITOT 1.5* 0.9  PROT 8.2* 5.6*  ALBUMIN 4.6 3.0*   Recent Labs  Lab 04/26/20 2150  LIPASE 21   No results  for input(s): AMMONIA in the last 168 hours. Coagulation Profile: Recent Labs  Lab 04/27/20 1501  INR 1.2   Cardiac Enzymes: No results for input(s): CKTOTAL, CKMB, CKMBINDEX, TROPONINI in the last 168 hours. BNP (last 3 results) No results for input(s): PROBNP in the last 8760 hours. HbA1C: No results for input(s): HGBA1C in the last 72 hours. CBG: Recent Labs  Lab 04/28/20 0759 04/28/20 1152 04/28/20 1640 04/28/20 2204  GLUCAP 111* 118* 114* 106*   Lipid Profile: No results for input(s): CHOL, HDL, LDLCALC, TRIG, CHOLHDL, LDLDIRECT in the last 72 hours. Thyroid Function Tests: No results for input(s): TSH, T4TOTAL, FREET4, T3FREE, THYROIDAB in the last 72 hours. Anemia Panel: No results for input(s): VITAMINB12, FOLATE, FERRITIN, TIBC, IRON, RETICCTPCT in the last 72 hours. Sepsis Labs: Recent Labs  Lab 04/27/20 0452 04/27/20 0618 04/27/20 1501  PROCALCITON 0.26  --   --   LATICACIDVEN  --  1.8 2.0*    Recent Results (from the past 240 hour(s))  Culture, blood (routine x 2)     Status: None (Preliminary result)   Collection Time: 04/27/20  6:18 AM   Specimen: BLOOD  Result Value Ref Range Status   Specimen Description BLOOD RIGHT HAND  Final   Special Requests   Final    BOTTLES DRAWN AEROBIC AND ANAEROBIC Blood Culture adequate volume   Culture   Final    NO GROWTH 1 DAY Performed at [facility], [address] [place], [place] [address]    Report Status PENDING  Incomplete  Culture, blood (routine x 2)     Status: None (Preliminary result)   Collection Time: 04/27/20  6:18 AM   Specimen: BLOOD  Result Value Ref Range Status   Specimen Description BLOOD RIGHT WRIST  Final   Special Requests   Final    BOTTLES DRAWN AEROBIC AND ANAEROBIC Blood Culture adequate volume   Culture   Final    NO GROWTH 1 DAY Performed at [facility], [address] [place], [place] [address]    Report Status PENDING  Incomplete  SARS Coronavirus 2 by  RT PCR (hospital order, performed in [place] hospital lab) Nasopharyngeal Nasopharyngeal Swab     Status: None   Collection Time: 04/27/20  6:18 AM   Specimen: Nasopharyngeal Swab  Result Value Ref Range Status   SARS Coronavirus 2 NEGATIVE NEGATIVE Final    Comment: (NOTE) SARS-CoV-2 target nucleic acids are  NOT DETECTED.  The SARS-CoV-2 RNA is generally detectable in upper and lower respiratory specimens during the acute phase of infection. The lowest concentration of SARS-CoV-2 viral copies this assay can detect is 250 copies / mL. A negative result does not preclude SARS-CoV-2 infection and should not be used as the sole basis for treatment or other patient management decisions.  A negative result may occur with improper specimen collection / handling, submission of specimen other than nasopharyngeal swab, presence of viral mutation(s) within the areas targeted by this assay, and inadequate number of viral copies (<250 copies / mL). A negative result must be combined with clinical observations, patient history, and epidemiological information.  Fact Sheet for Patients:   StrictlyIdeas.no  Fact Sheet for Healthcare Providers: BankingDealers.co.za  This test is not yet approved or  cleared by the [place] FDA and has been authorized for detection and/or diagnosis of SARS-CoV-2 by FDA under an Emergency Use Authorization (EUA).  This EUA will remain in effect (meaning this test can be used) for the duration of the COVID-19 declaration under Section 564(b)(1) of the Act, 21 U.S.C. section 360bbb-3(b)(1), unless the authorization is terminated or revoked sooner.  Performed at [facility], [address] [place], [place] [address]          Radiology Studies: CT Chest Wo Contrast  Result Date: 04/27/2020 CLINICAL DATA:  Food bolus versus mass. EXAM: CT CHEST WITHOUT CONTRAST TECHNIQUE: Multidetector CT  imaging of the chest was performed following the standard protocol without IV contrast. COMPARISON:  Abdominal CT from earlier today FINDINGS: Cardiovascular: Normal heart size. No pericardial effusion. Diffuse atherosclerotic calcification of the coronaries and also of the aorta. Mediastinum/Nodes: Midesophageal low-density appearance on prior CT correlates with frothy material distending the mid esophagus. No discrete mass is seen, although limited without contrast. Prominence of the subcarinal lymph node but no definite adenopathy. Anticipate upcoming EGD. Lungs/Pleura: Emphysema with areas of fibrosis and subpleural honeycombing. There is a large bulla involving the right anterior paramediastinal space with mass effect on mediastinal structures. Upper Abdomen: Reported on prior study Musculoskeletal: Unremarkable. IMPRESSION: 1. Distended midesophagus by semi solid appearing material, most likely food bolus. A distal stricture is implied. A lesion could easily be obscured and there is history of hematemesis, recommend GI follow-up. 2. Emphysema with superimposed fibrotic areas on the left. 3. Aortic Atherosclerosis (ICD10-I70.0) and Emphysema (ICD10-J43.9). Electronically Signed   By: Monte Fantasia M.D.   On: 04/27/2020 07:57        Scheduled Meds: . acidophilus  1 capsule Oral BID  . ascorbic acid  500 mg Oral BID  . atorvastatin  10 mg Oral QHS  . baclofen  10 mg Oral TID  . fentaNYL  1 patch Transdermal Q72H  . ferrous sulfate  324 mg Oral BID  . fluticasone furoate-vilanterol  1 puff Inhalation Daily   And  . umeclidinium bromide  1 puff Inhalation Daily  . gabapentin  100 mg Oral QHS  . omega-3 acid ethyl esters  1 g Oral Daily  . tamsulosin  0.4 mg Oral Daily  . thiamine  100 mg Intravenous Daily   Continuous Infusions: . dextrose 50 mL/hr at 04/29/20 0613  . pantoprozole (PROTONIX) infusion Stopped (04/28/20 0717)     LOS: 1 day    Time spent: 30 mins     Wyvonnia Dusky, MD [facility] Pager [phone]  If 7PM-7AM, please contact night-coverage [email or web address] 04/29/2020, 7:17 AM

## 2020-04-29 NOTE — Care Plan (Signed)
Pt has had no emesis this shift. Pt has also not complained of pain. Zero on CIWA. Pt has tolerated PO and diet thus far. Pt been in bed watching tv and has worked with therapy today. Pt states he is ready to go home. Pt on tele has been NSR most of the day. Last BM was 04-26-20. Blood sugars have been WDL and VSS.

## 2020-04-29 NOTE — Progress Notes (Signed)
Timothy Antigua, MD [address], [place], [place], [place], [address] [address], [place], [place], [place], [address] Phone: [phone]  Fax: [phone]   Subjective:  Patient has not had any further rebleeding since his procedure.  Hemoglobin stable.  Tolerating oral diet.  Objective: Exam: Vital signs in last 24 hours: Vitals:   04/28/20 1152 04/28/20 1940 04/29/20 0642 04/29/20 1130  BP: (!) 112/52 (!) 105/51 (!) 110/54 (!) 108/56  Pulse: 80 84 76 70  Resp: 16 18 15    Temp: 98.7 F (37.1 C) 99.1 F (37.3 C) 98.6 F (37 C) 98.5 F (36.9 C)  TempSrc:  Oral Oral Oral  SpO2: 96% 94% 92% 95%  Weight:      Height:       Weight change:   Intake/Output Summary (Last 24 hours) at 04/29/2020 1617 Last data filed at 04/29/2020 0805 Gross per 24 hour  Intake 742.5 ml  Output 700 ml  Net 42.5 ml    General: No acute distress, AAO x3 Abd: Soft, NT/ND, No HSM Skin: Warm, no rashes Neck: Supple, Trachea midline   Lab Results: Lab Results  Component Value Date   WBC 14.7 (H) 04/29/2020   HGB 9.6 (L) 04/29/2020   HCT 29.0 (L) 04/29/2020   MCV 85.8 04/29/2020   PLT 173 04/29/2020   Micro Results: Recent Results (from the past 240 hour(s))  Culture, blood (routine x 2)     Status: None (Preliminary result)   Collection Time: 04/27/20  6:18 AM   Specimen: BLOOD  Result Value Ref Range Status   Specimen Description BLOOD RIGHT HAND  Final   Special Requests   Final    BOTTLES DRAWN AEROBIC AND ANAEROBIC Blood Culture adequate volume   Culture   Final    NO GROWTH 2 DAYS Performed at [facility], [address] [place], [place] [address]    Report Status PENDING  Incomplete  Culture, blood (routine x 2)     Status: None (Preliminary result)   Collection Time: 04/27/20  6:18 AM   Specimen: BLOOD  Result Value Ref Range Status   Specimen Description BLOOD RIGHT WRIST  Final   Special Requests   Final    BOTTLES DRAWN AEROBIC AND ANAEROBIC  Blood Culture adequate volume   Culture   Final    NO GROWTH 2 DAYS Performed at [facility], [address] [place], [place] [address]    Report Status PENDING  Incomplete  SARS Coronavirus 2 by RT PCR (hospital order, performed in [place] hospital lab) Nasopharyngeal Nasopharyngeal Swab     Status: None   Collection Time: 04/27/20  6:18 AM   Specimen: Nasopharyngeal Swab  Result Value Ref Range Status   SARS Coronavirus 2 NEGATIVE NEGATIVE Final    Comment: (NOTE) SARS-CoV-2 target nucleic acids are NOT DETECTED.  The SARS-CoV-2 RNA is generally detectable in upper and lower respiratory specimens during the acute phase of infection. The lowest concentration of SARS-CoV-2 viral copies this assay can detect is 250 copies / mL. A negative result does not preclude SARS-CoV-2 infection and should not be used as the sole basis for treatment or other patient management decisions.  A negative result may occur with improper specimen collection / handling, submission of specimen other than nasopharyngeal swab, presence of viral mutation(s) within the areas targeted by this assay, and inadequate number of viral copies (<250 copies / mL). A negative result must be combined with clinical observations, patient history, and epidemiological  information.  Fact Sheet for Patients:   StrictlyIdeas.no  Fact Sheet for Healthcare Providers: BankingDealers.co.za  This test is not yet approved or  cleared by the [place] FDA and has been authorized for detection and/or diagnosis of SARS-CoV-2 by FDA under an Emergency Use Authorization (EUA).  This EUA will remain in effect (meaning this test can be used) for the duration of the COVID-19 declaration under Section 564(b)(1) of the Act, 21 U.S.C. section 360bbb-3(b)(1), unless the authorization is terminated or revoked sooner.  Performed at [facility], [address] [place], [place] [address]    Studies/Results: No results found. Medications:  Scheduled Meds: . acidophilus  1 capsule Oral BID  . ascorbic acid  500 mg Oral BID  . atorvastatin  10 mg Oral QHS  . baclofen  10 mg Oral TID  . fentaNYL  1 patch Transdermal Q72H  . ferrous sulfate  324 mg Oral BID  . fluticasone furoate-vilanterol  1 puff Inhalation Daily   And  . umeclidinium bromide  1 puff Inhalation Daily  . gabapentin  100 mg Oral QHS  . omega-3 acid ethyl esters  1 g Oral Daily  . tamsulosin  0.4 mg Oral Daily  . thiamine  100 mg Intravenous Daily   Continuous Infusions: . dextrose 50 mL/hr at 04/29/20 1126  . pantoprozole (PROTONIX) infusion 8 mg/hr (04/29/20 1225)   PRN Meds:.acetaminophen **OR** acetaminophen, albuterol, docusate sodium, guaiFENesin, hydrALAZINE, loperamide, melatonin, metoCLOPramide (REGLAN) injection, morphine injection, nitroGLYCERIN, ondansetron (ZOFRAN) IV, oxyCODONE   Assessment: Principal Problem:   Hematemesis Active Problems:   Hyperlipidemia   Essential hypertension   Chronic deep vein thrombosis (DVT) of left lower extremity (HCC)   COPD with chronic bronchitis (HCC)   Chronic diastolic CHF (congestive heart failure) (HCC)   Leukocytosis   Alcohol use   Esophageal ulcer with bleeding    Plan: No further GI bleeding in 48 hours post procedure  PPI IV twice daily inpatient and PO BID for 8-12 weeks outpatient  Continue serial CBCs and transfuse PRN Avoid NSAIDs Maintain 2 large-bore IV lines Please page GI with any acute hemodynamic changes, or signs of active GI bleeding  GI service will sign off at this time. Close follow-up with Dr. Marius Ditch on discharge, please Arrange follow-up at discharge  Keep head of bed elevated 30 degrees at all times, antireflux measures are important at this time   LOS: 1 day   Timothy Antigua, MD 04/29/2020, 4:17 PM

## 2020-04-30 ENCOUNTER — Encounter: Payer: Self-pay | Admitting: Gastroenterology

## 2020-04-30 DIAGNOSIS — K920 Hematemesis: Secondary | ICD-10-CM | POA: Diagnosis not present

## 2020-04-30 DIAGNOSIS — Z7289 Other problems related to lifestyle: Secondary | ICD-10-CM | POA: Diagnosis not present

## 2020-04-30 DIAGNOSIS — K2211 Ulcer of esophagus with bleeding: Secondary | ICD-10-CM | POA: Diagnosis not present

## 2020-04-30 DIAGNOSIS — I10 Essential (primary) hypertension: Secondary | ICD-10-CM | POA: Diagnosis not present

## 2020-04-30 LAB — COMPREHENSIVE METABOLIC PANEL
ALT: 11 U/L (ref 0–44)
AST: 13 U/L — ABNORMAL LOW (ref 15–41)
Albumin: 3.1 g/dL — ABNORMAL LOW (ref 3.5–5.0)
Alkaline Phosphatase: 66 U/L (ref 38–126)
Anion gap: 7 (ref 5–15)
BUN: 13 mg/dL (ref 8–23)
CO2: 23 mmol/L (ref 22–32)
Calcium: 8 mg/dL — ABNORMAL LOW (ref 8.9–10.3)
Chloride: 110 mmol/L (ref 98–111)
Creatinine, Ser: 0.96 mg/dL (ref 0.61–1.24)
GFR calc Af Amer: >60 mL/min (ref >60)
GFR calc non Af Amer: >60 mL/min (ref >60)
Glucose, Bld: 113 mg/dL — ABNORMAL HIGH (ref 70–99)
Potassium: 3.4 mmol/L — ABNORMAL LOW (ref 3.5–5.1)
Sodium: 140 mmol/L (ref 135–145)
Total Bilirubin: 1 mg/dL (ref 0.3–1.2)
Total Protein: 5.7 g/dL — ABNORMAL LOW (ref 6.5–8.1)

## 2020-04-30 LAB — GLUCOSE, CAPILLARY: Glucose-Capillary: 99 mg/dL (ref 70–99)

## 2020-04-30 MED ORDER — ELIQUIS 5 MG PO TABS
5.0000 mg | ORAL_TABLET | Freq: Two times a day (BID) | ORAL | 0 refills | Status: DC
Start: 2020-04-30 — End: 2020-05-10

## 2020-04-30 MED ORDER — PANTOPRAZOLE SODIUM 40 MG PO TBEC: 40.0000 mg | DELAYED_RELEASE_TABLET | Freq: Two times a day (BID) | ORAL | 0 refills | Status: DC

## 2020-04-30 NOTE — TOC Initial Note (Signed)
Transition of Care [facility]) - Initial/Assessment Note    Patient Details  Name: Timothy Juarez MRN: [id number] Date of Birth: [birth year]  Transition [facility] CM/SW Contact:    Candie Chroman, LCSW Phone Number: 04/30/2020, 12:00 PM  Clinical Narrative: CSW met with patient. No supports at bedside. CSW introduced role and explained that discharge planning would be discussed. Patient confirmed he was admitted from [facility] and plan is to return there today. He gave CSW permission to notify one of his children. CSW notified daughter Elmo Putt. She asked how his procedure went. MD will call her when she gets a chance to provide an update. CSW spoke to [facility] admissions coordinator. She confirmed patient is a long-term resident there. Patient is his own guardian. No further concerns. CSW encouraged patient and his daughter to contact CSW as needed. CSW will continue to follow patient for support and facilitate return to SNF today.                 Expected Discharge Plan: Skilled Nursing Facility Barriers to Discharge: No Barriers Identified   Patient Goals and CMS Choice     Choice offered to / list presented to : NA  Expected Discharge Plan and Services Expected Discharge Plan: [place] Acute Care Choice: Resumption of Svcs/PTA Provider Living arrangements for the past 2 months: [place] Expected Discharge Date: 04/30/20                                    Prior Living Arrangements/Services Living arrangements for the past 2 months: [place] Lives with:: Facility Resident Patient language and need for interpreter reviewed:: Yes Do you feel safe going back to the place where you live?: Yes      Need for Family Participation in Patient Care: Yes (Comment) Care giver support system in place?: Yes (comment)   Criminal Activity/Legal Involvement Pertinent to Current Situation/Hospitalization: No - Comment as  needed  Activities of Daily Living Home Assistive Devices/Equipment: Wheelchair ADL Screening (condition at time of admission) Patient's cognitive ability adequate to safely complete daily activities?: Yes Is the patient deaf or have difficulty hearing?: No Does the patient have difficulty seeing, even when wearing glasses/contacts?: No Does the patient have difficulty concentrating, remembering, or making decisions?: No Patient able to express need for assistance with ADLs?: Yes Does the patient have difficulty dressing or bathing?: Yes Independently performs ADLs?: No Does the patient have difficulty walking or climbing stairs?: Yes Weakness of Legs: Left Weakness of Arms/Hands: None  Permission Sought/Granted Permission sought to share information with : Facility Sport and exercise psychologist, Family Supports Permission granted to share information with : Yes, Verbal Permission Granted  Share Information with NAME: Kervin Bones  Permission granted to share info w AGENCY: [facility]  Permission granted to share info w Relationship: Daughter  Permission granted to share info w Contact Information: [phone]  Emotional Assessment Appearance:: Appears stated age Attitude/Demeanor/Rapport: Engaged, Gracious Affect (typically observed): Accepting, Appropriate, Calm, Pleasant Orientation: : Oriented to Self, Oriented to Place, Oriented to  Time, Oriented to Situation Alcohol / Substance Use: Not Applicable Psych Involvement: No (comment)  Admission diagnosis:  Hematemesis [K92.0] Hematemesis with nausea [K92.0] Leukocytosis, unspecified type [D72.829] Esophageal ulcer with bleeding [K22.11] Patient Active Problem List   Diagnosis Date Noted  . Esophageal ulcer with bleeding 04/28/2020  . Hematemesis 04/27/2020  .  Chronic diastolic CHF (congestive heart failure) ([place]) 04/27/2020  . Leukocytosis 04/27/2020  . Alcohol use 04/27/2020  . Pulmonary disease   . Hypotension  09/29/2019  . Cellulitis and abscess of leg 09/27/2019  . Infection of above knee amputation stump ([place]) 09/27/2019  . History of COVID-19 09/27/2019  . Abscess of left AKA stump 09/27/2019  . (HFpEF) heart failure with preserved ejection fraction ([place]) 09/27/2019  . COPD with chronic bronchitis ([place]) 09/27/2019  . Chronic anticoagulation with eliquis 09/27/2019  . Preoperative clearance 09/27/2019  . Acute on chronic respiratory failure with hypoxia ([place]) 12/27/2018  . Community acquired bilateral lower lobe pneumonia 12/27/2018  . COVID-19 virus infection 12/27/2018  . High anion gap metabolic acidosis 22/24/1146  . Severe sepsis ([place]) 12/27/2018  . AKI (acute kidney injury) ([place]) 12/27/2018  . Toxic encephalopathy 12/27/2018  . Acute respiratory failure with hypoxia ([place]) 12/27/2018  . Hx of AKA (above knee amputation), left ([place]) 11/02/2018  . Symptomatic anemia 10/10/2017  . Gastrointestinal hemorrhage   . Chronic deep vein thrombosis (DVT) of left lower extremity ([place])   . Iron deficiency anemia 04/21/2017  . Essential hypertension 03/13/2017  . Recurrent deep vein thrombosis (DVT) ([place]) 03/11/2017  . Hyperlipidemia 11/04/2016  . Pressure ulcer 04/11/2016  . Pseudoaneurysm of left femoral artery ([place]) 04/11/2016  . PAD (peripheral artery disease) ([place]) 04/10/2016  . Atherosclerosis of native arteries of extremities with gangrene, left leg ([place]) 11/01/2015  . Ischemia of lower extremity 09/14/2015  . Atherosclerotic peripheral vascular disease with ulceration ([place]) 01/18/2015   PCP:  Alvester Morin, MD Pharmacy:   [place], [place] [address] Phone: [phone] Fax: [phone]     Social Determinants of Health (SDOH) Interventions    Readmission Risk Interventions No flowsheet data found.

## 2020-04-30 NOTE — Discharge Summary (Signed)
Physician Discharge Summary  Timothy Juarez IEP:[id number] DOB: 1950-11-05 DOA: 04/27/2020  PCP: Alvester Morin, MD  Admit date: 04/27/2020 Discharge date: 04/30/2020  Admitted From: home facility  Disposition:  Home facility   Recommendations for Outpatient Follow-up:  1. Follow up with PCP in 1-2 weeks 2. F/u GI, Dr. Marius Ditch, in 1 week  Home Health: no Equipment/Devices:  Discharge Condition: stable CODE STATUS: full  Diet recommendation: Heart Healthy  Brief/Interim Summary: HPI was taken from Dr. Blaine Hamper: Timothy Juarez is a 69 y.o. male with medical history significant of HTN, HLD, COPD, dCHF, DVT on Eliquis, L AKA, GIB, alcohol abuse, presents with hematemesis.     Patient has been having nausea, vomiting and hematemesis with bright red blood for 3 times.  Patient states that he had mild pain on swallowing yesterday, which has resolved currently. He also has melena stool.  Denies abdominal pain or diarrhea. Patient has mild shortness of breath, no chest pain, cough, fever or chills.  No symptoms of UTI or unilateral weakness.  ED Course: pt was found to have hemoglobin 16.2, WBC 23.5, lactic acid 1.8, troponin level 9, 12, negative Covid PCR, alcohol level less than 10, electrolytes and renal function okay, temperature normal, blood pressure 119/86, tachycardia, RR 19, oxygen sat 91 to 97% on room air.  Chest x-ray negative.  CT scan showed possible food bolus impaction.  Patient is placed on MedSurg bed for observation.  Dr. Marius Ditch for GI is consulted.  Hospital Course from Dr. Lenise Herald 04/28/20-04/30/20: Pt presented w/ hematemesis secondary to esophageal ulcers w/ stigmata of recent bleeding. Pt did receive 3 days of IV protonix and will be d/c home w/ po pantoprazole 40mg  BID. Pt will continue to hold home dose of eliquis until the pt sees GI, Dr. Marius Ditch within 1 week. Pt will f/u w/ GI, Dr. Marius Ditch, within 1 week. Pt verbalized his understanding. Of note, pt also  received alcohol cessation counseling. For more information, please see previous progress notes.   Discharge Diagnoses:  Principal Problem:   Hematemesis Active Problems:   Hyperlipidemia   Essential hypertension   Chronic deep vein thrombosis (DVT) of left lower extremity (HCC)   COPD with chronic bronchitis (HCC)   Chronic diastolic CHF (congestive heart failure) (HCC)   Leukocytosis   Alcohol use   Esophageal ulcer with bleeding  Hematemesis:secondary to esophageal ulcers w/ stigmata of recent bleeding. Clear liquid diet as per GI. Continue on IV protonix for 3 days as per GI  Alcohol abuse: continue on CIWA protocol. Alcohol cessation counseling   Hypernatremia: resolved   HLD: continue on statin   HTN: hold home dose of lasix while NPO. IV hydralazine prn   Chronic DVT of [name]: continue to hold home dose of eliquis until GI says it is ok to restart   COPD: w/o exacerbation. Continue on bronchodilators & encourage incentive spirometry   Chronic diastolic CHF: echos showed a EF of 60-65%. Seems euvolemic. Will hold home dose of lasix while NPO   Leukocytosis:etiology unclear, reactive vs infection. Blood cxs NGTD. Will continue to monitor    Discharge Instructions  Discharge Instructions    Diet - low sodium heart healthy   Complete by: As directed    Discharge instructions   Complete by: As directed    F/u GI, Dr. Marius Ditch, in 1 week. F/u PCP in 2 weeks   Increase activity slowly   Complete by: As directed      Allergies as of 04/30/2020  No Known Allergies     Medication List    STOP taking these medications   aspirin EC 81 MG tablet     TAKE these medications   acetaminophen 325 MG tablet Commonly known as: TYLENOL Take 650 mg by mouth every 4 (four) hours as needed for fever.   acidophilus Caps capsule Take 1 capsule by mouth 2 (two) times daily.   ammonium lactate 12 % cream Commonly known as: AMLACTIN Apply topically 2 (two) times  daily. Apply to right foot   ascorbic acid 500 MG tablet Commonly known as: VITAMIN C Take 500 mg by mouth 2 (two) times daily.   atorvastatin 10 MG tablet Commonly known as: Lipitor Take 1 tablet (10 mg total) by mouth daily. What changed: when to take this   baclofen 10 MG tablet Commonly known as: LIORESAL Take 10 mg by mouth 3 (three) times daily. Hold for sedation   ciclopirox 8 % solution Commonly known as: PENLAC Apply 1 application topically daily. Apply solutions to toenails   Combigan 0.2-0.5 % ophthalmic solution Generic drug: brimonidine-timolol Place 1 drop into the right eye 2 (two) times daily.   docusate sodium 100 MG capsule Commonly known as: COLACE Take 100 mg by mouth daily as needed for mild constipation.   Durezol 0.05 % Emul Generic drug: Difluprednate Place 1 drop into the right eye 3 (three) times daily.   Durezol 0.05 % Emul Generic drug: Difluprednate Apply 1 drop to eye daily.   Eliquis 5 MG Tabs tablet Generic drug: apixaban Take 1 tablet (5 mg total) by mouth 2 (two) times daily. Hold off taking this medication until you see GI, Dr. Marius Ditch, in 1 week or whenever she tells you it is ok restart taking this medication What changed: additional instructions   fentaNYL 25 MCG/HR Commonly known as: [place] 1 patch onto the skin every 3 (three) days. Notes to patient: Last dose 9/11   ferrous sulfate 324 (65 Fe) MG Tbec Take 324 mg by mouth 2 (two) times daily.   Fish Oil 1000 MG Caps Take 1,000 mg by mouth daily.   furosemide 40 MG tablet Commonly known as: LASIX Take 1 tablet (40 mg total) by mouth daily.   gabapentin 100 MG capsule Commonly known as: NEURONTIN Take 100 mg by mouth at bedtime.   guaiFENesin 100 MG/5ML Soln Commonly known as: ROBITUSSIN Take 15 mLs by mouth 3 (three) times daily as needed for cough (congestion).   loperamide 2 MG capsule Commonly known as: IMODIUM Take 2 mg by mouth 3 (three) times daily  as needed for diarrhea or loose stools.   melatonin 5 MG Tabs Take 5 mg by mouth at bedtime as needed (insomnia).   morphine CONCENTRATE 10 mg / 0.5 ml concentrated solution   nitroGLYCERIN 0.4 MG SL tablet Commonly known as: NITROSTAT Place 0.14 mg under the tongue every 5 (five) minutes as needed for chest pain.   Oxycodone HCl 10 MG Tabs Take 10 mg by mouth every 4 (four) hours as needed (pain).   oxymetazoline 0.05 % nasal spray Commonly known as: AFRIN Place 2 sprays into both nostrils 2 (two) times daily as needed (For nose bleeds).   pantoprazole 40 MG tablet Commonly known as: Protonix Take 1 tablet (40 mg total) by mouth 2 (two) times daily.   potassium chloride SA 20 MEQ tablet Commonly known as: KLOR-CON Take 20 mEq by mouth daily. With or after meal   sodium chloride 0.65 % Soln nasal spray  Commonly known as: OCEAN Place 2 sprays into both nostrils 2 (two) times daily as needed for congestion.   tamsulosin 0.4 MG Caps capsule Commonly known as: FLOMAX Take 1 capsule (0.4 mg total) by mouth daily.   Trelegy Ellipta 100-62.5-25 MCG/INH Aepb Generic drug: Fluticasone-Umeclidin-Vilant Inhale 1 puff into the lungs every morning.   triamcinolone cream 0.1 % Commonly known as: KENALOG Apply 1 application topically 2 (two) times daily.       Follow-up Information    Vanga, Tally Due, MD In 1 week.   Specialty: Gastroenterology Why: left message for them to call pt Contact information: [place] [address] [phone]              No Known Allergies  Consultations:  GI   Procedures/Studies: DG Chest 2 View  Result Date: 04/27/2020 CLINICAL DATA:  Vomiting blood and esophageal pain with swallowing. EXAM: CHEST - 2 VIEW COMPARISON:  10/01/2019 FINDINGS: Shallow inspiration. Heart size and pulmonary vascularity are normal. Scarring and volume loss in the left upper lung is unchanged. No airspace disease or consolidation. No  pleural effusions. No pneumothorax. Mediastinal contours are unchanged with suggestion of paratracheal and pleural thickening in the left upper lung. IMPRESSION: No evidence of active pulmonary disease. Chronic scarring and pleural changes in the left apex. Electronically Signed   By: Lucienne Capers M.D.   On: 04/27/2020 00:10   CT Chest Wo Contrast  Result Date: 04/27/2020 CLINICAL DATA:  Food bolus versus mass. EXAM: CT CHEST WITHOUT CONTRAST TECHNIQUE: Multidetector CT imaging of the chest was performed following the standard protocol without IV contrast. COMPARISON:  Abdominal CT from earlier today FINDINGS: Cardiovascular: Normal heart size. No pericardial effusion. Diffuse atherosclerotic calcification of the coronaries and also of the aorta. Mediastinum/Nodes: Midesophageal low-density appearance on prior CT correlates with frothy material distending the mid esophagus. No discrete mass is seen, although limited without contrast. Prominence of the subcarinal lymph node but no definite adenopathy. Anticipate upcoming EGD. Lungs/Pleura: Emphysema with areas of fibrosis and subpleural honeycombing. There is a large bulla involving the right anterior paramediastinal space with mass effect on mediastinal structures. Upper Abdomen: Reported on prior study Musculoskeletal: Unremarkable. IMPRESSION: 1. Distended midesophagus by semi solid appearing material, most likely food bolus. A distal stricture is implied. A lesion could easily be obscured and there is history of hematemesis, recommend GI follow-up. 2. Emphysema with superimposed fibrotic areas on the left. 3. Aortic Atherosclerosis (ICD10-I70.0) and Emphysema (ICD10-J43.9). Electronically Signed   By: Monte Fantasia M.D.   On: 04/27/2020 07:57   CT Abdomen Pelvis W Contrast  Result Date: 04/27/2020 CLINICAL DATA:  Hematemesis and pain, initial encounter EXAM: CT ABDOMEN AND PELVIS WITH CONTRAST TECHNIQUE: Multidetector CT imaging of the abdomen and  pelvis was performed using the standard protocol following bolus administration of intravenous contrast. CONTRAST:  30mL OMNIPAQUE IOHEXOL 300 MG/ML  SOLN COMPARISON:  10/11/2017 FINDINGS: Lower chest: Emphysematous changes and scarring are seen in the bases bilateral. Hepatobiliary: No focal liver abnormality is seen. No gallstones, gallbladder wall thickening, or biliary dilatation. Pancreas: Unremarkable. No pancreatic ductal dilatation or surrounding inflammatory changes. Spleen: Normal in size without focal abnormality. Adrenals/Urinary Tract: Adrenal glands are within normal limits. Kidneys demonstrate a normal enhancement pattern bilaterally. Delayed images demonstrate normal excretion. Left renal cyst is again noted centrally. No obstructive or inflammatory changes are seen. The bladder is well distended. Stomach/Bowel: Scattered diverticular change of the colon is noted. The appendix is within normal limits. Small bowel  and stomach are within normal limits as well. Within the mid to distal esophagus there is a central area of decreased attenuation identified. This is incompletely evaluated on this exam. CT of the chest may be helpful. Alternate barium swallow may be helpful. Correlate with any possible retained food bolus. Vascular/Lymphatic: Aortic atherosclerosis. No enlarged abdominal or pelvic lymph nodes. IVC filter is noted in place. Vascular stents are noted in the iliac arteries bilaterally. Reproductive: Prostate is unremarkable. Other: No abdominal wall hernia or abnormality. No abdominopelvic ascites. Musculoskeletal: Degenerative changes of lumbar spine are noted. IMPRESSION: Area of decreased attenuation identified in the mid esophagus incompletely evaluated on this exam. This may represent a retained food bolus or possible mass. CT of the chest may be helpful for further evaluation. Alternatively barium swallow could be performed as clinically indicated. Diverticulosis without diverticulitis.  Electronically Signed   By: Inez Catalina M.D.   On: 04/27/2020 06:43     Subjective: pt c/o fatigue    Discharge Exam: Vitals:   04/29/20 1922 04/30/20 0507  BP: (!) 115/54 (!) 111/51  Pulse: 69 67  Resp: 16 18  Temp: 98.9 F (37.2 C) 98.5 F (36.9 C)  SpO2: 93% 97%   Vitals:   04/29/20 1700 04/29/20 1922 04/30/20 0500 04/30/20 0507  BP: (!) 107/58 (!) 115/54  (!) 111/51  Pulse: 71 69  67  Resp:  16  18  Temp:  98.9 F (37.2 C)  98.5 F (36.9 C)  TempSrc:  Oral  Oral  SpO2:  93%  97%  Weight:   100.9 kg   Height:        General: Pt is alert, awake, not in acute distress Cardiovascular: S1/S2 +, no rubs, no gallops Respiratory: CTA bilaterally, no wheezing, no rhonchi Abdominal: Soft, NT, obese, bowel sounds + Extremities:  no cyanosis    The results of significant diagnostics from this hospitalization (including imaging, microbiology, ancillary and laboratory) are listed below for reference.     Microbiology: Recent Results (from the past 240 hour(s))  Culture, blood (routine x 2)     Status: None (Preliminary result)   Collection Time: 04/27/20  6:18 AM   Specimen: BLOOD  Result Value Ref Range Status   Specimen Description BLOOD RIGHT HAND  Final   Special Requests   Final    BOTTLES DRAWN AEROBIC AND ANAEROBIC Blood Culture adequate volume   Culture   Final    NO GROWTH 3 DAYS Performed at [facility], [address], [place], [place] [address]    Report Status PENDING  Incomplete  Culture, blood (routine x 2)     Status: None (Preliminary result)   Collection Time: 04/27/20  6:18 AM   Specimen: BLOOD  Result Value Ref Range Status   Specimen Description BLOOD RIGHT WRIST  Final   Special Requests   Final    BOTTLES DRAWN AEROBIC AND ANAEROBIC Blood Culture adequate volume   Culture   Final    NO GROWTH 3 DAYS Performed at [facility], [address], [place], [place] [address]    Report Status PENDING  Incomplete  SARS  Coronavirus 2 by RT PCR (hospital order, performed in [place] hospital lab) Nasopharyngeal Nasopharyngeal Swab     Status: None   Collection Time: 04/27/20  6:18 AM   Specimen: Nasopharyngeal Swab  Result Value Ref Range Status   SARS Coronavirus 2 NEGATIVE NEGATIVE Final    Comment: (NOTE) SARS-CoV-2 target nucleic acids are NOT DETECTED.  The SARS-CoV-2 RNA is generally detectable in upper and lower respiratory specimens during the acute phase of infection. The lowest concentration of SARS-CoV-2 viral copies this assay can detect is 250 copies / mL. A negative result does not preclude SARS-CoV-2 infection and should not be used as the sole basis for treatment or other patient management decisions.  A negative result may occur with improper specimen collection / handling, submission of specimen other than nasopharyngeal swab, presence of viral mutation(s) within the areas targeted by this assay, and inadequate number of viral copies (<250 copies / mL). A negative result must be combined with clinical observations, patient history, and epidemiological information.  Fact Sheet for Patients:   StrictlyIdeas.no  Fact Sheet for Healthcare Providers: BankingDealers.co.za  This test is not yet approved or  cleared by the [place] FDA and has been authorized for detection and/or diagnosis of SARS-CoV-2 by FDA under an Emergency Use Authorization (EUA).  This EUA will remain in effect (meaning this test can be used) for the duration of the COVID-19 declaration under Section 564(b)(1) of the Act, 21 U.S.C. section 360bbb-3(b)(1), unless the authorization is terminated or revoked sooner.  Performed at [facility], [address] [place], [place] [address]      Labs: BNP (last 3 results) Recent Labs    04/27/20 1354  BNP 26.3   Basic Metabolic Panel: Recent Labs  Lab 04/26/20 2150 04/28/20 0322 04/29/20 0450  04/30/20 0650  NA 140 146* 145 140  K 3.8 3.8 3.6 3.4*  CL 103 119* 115* 110  CO2 26 21* 22 23  GLUCOSE 136* 121* 117* 113*  BUN 21 53* 25* 13  CREATININE 1.14 1.25* 1.12 0.96  CALCIUM 8.9 8.0* 8.1* 8.0*   Liver Function Tests: Recent Labs  Lab 04/26/20 2150 04/29/20 0450 04/30/20 0650  AST 23 14* 13*  ALT 22 10 11   ALKPHOS 113 59 66  BILITOT 1.5* 0.9 1.0  PROT 8.2* 5.6* 5.7*  ALBUMIN 4.6 3.0* 3.1*   Recent Labs  Lab 04/26/20 2150  LIPASE 21   No results for input(s): AMMONIA in the last 168 hours. CBC: Recent Labs  Lab 04/26/20 2150 04/27/20 1354 04/28/20 0322 04/28/20 0849 04/28/20 1601 04/28/20 2039 04/29/20 0450  WBC 23.5*   < > 18.3* 18.7* 18.7* 20.2* 14.7*  NEUTROABS 19.9*  --   --   --   --   --   --   HGB 16.2   < > 10.2* 10.1* 9.5* 10.4* 9.6*  HCT 48.1   < > 30.1* 30.0* 29.8* 30.4* 29.0*  MCV 82.2   < > 82.9 83.6 87.1 83.3 85.8  PLT 279   < > 180 192 176 158 173   < > = values in this interval not displayed.   Cardiac Enzymes: No results for input(s): CKTOTAL, CKMB, CKMBINDEX, TROPONINI in the last 168 hours. BNP: Invalid input(s): POCBNP CBG: Recent Labs  Lab 04/28/20 2204 04/29/20 0801 04/29/20 1127 04/29/20 1608 04/30/20 0759  GLUCAP 106* 107* 117* 100* 99   D-Dimer No results for input(s): DDIMER in the last 72 hours. Hgb A1c No results for input(s): HGBA1C in the last 72 hours. Lipid Profile No results for input(s): CHOL, HDL, LDLCALC, TRIG, CHOLHDL, LDLDIRECT in the last 72 hours. Thyroid function studies No results for input(s): TSH, T4TOTAL, T3FREE, THYROIDAB in the last 72 hours.  Invalid input(s): FREET3 Anemia work up No results for input(s): VITAMINB12, FOLATE, FERRITIN, TIBC, IRON, RETICCTPCT in the last 72 hours. Urinalysis  Component Value Date/Time   COLORURINE AMBER (A) 12/27/2018 1045   APPEARANCEUR HAZY (A) 12/27/2018 1045   APPEARANCEUR Clear 11/29/2012 1529   LABSPEC 1.016 12/27/2018 1045   LABSPEC 1.017  11/29/2012 1529   PHURINE 5.0 12/27/2018 1045   GLUCOSEU NEGATIVE 12/27/2018 1045   GLUCOSEU Negative 11/29/2012 1529   HGBUR SMALL (A) 12/27/2018 [place] 12/27/2018 1045   BILIRUBINUR Negative 11/29/2012 1529   KETONESUR NEGATIVE 12/27/2018 1045   PROTEINUR 30 (A) 12/27/2018 1045   NITRITE NEGATIVE 12/27/2018 [place] 12/27/2018 1045   LEUKOCYTESUR 1+ 11/29/2012 1529   Sepsis Labs Invalid input(s): PROCALCITONIN,  WBC,  LACTICIDVEN Microbiology Recent Results (from the past 240 hour(s))  Culture, blood (routine x 2)     Status: None (Preliminary result)   Collection Time: 04/27/20  6:18 AM   Specimen: BLOOD  Result Value Ref Range Status   Specimen Description BLOOD RIGHT HAND  Final   Special Requests   Final    BOTTLES DRAWN AEROBIC AND ANAEROBIC Blood Culture adequate volume   Culture   Final    NO GROWTH 3 DAYS Performed at [facility], [address], [place], [place] [address]    Report Status PENDING  Incomplete  Culture, blood (routine x 2)     Status: None (Preliminary result)   Collection Time: 04/27/20  6:18 AM   Specimen: BLOOD  Result Value Ref Range Status   Specimen Description BLOOD RIGHT WRIST  Final   Special Requests   Final    BOTTLES DRAWN AEROBIC AND ANAEROBIC Blood Culture adequate volume   Culture   Final    NO GROWTH 3 DAYS Performed at [facility], [address] [place], [place] [address]    Report Status PENDING  Incomplete  SARS Coronavirus 2 by RT PCR (hospital order, performed in [place] hospital lab) Nasopharyngeal Nasopharyngeal Swab     Status: None   Collection Time: 04/27/20  6:18 AM   Specimen: Nasopharyngeal Swab  Result Value Ref Range Status   SARS Coronavirus 2 NEGATIVE NEGATIVE Final    Comment: (NOTE) SARS-CoV-2 target nucleic acids are NOT DETECTED.  The SARS-CoV-2 RNA is generally detectable in upper and lower respiratory specimens during the acute phase  of infection. The lowest concentration of SARS-CoV-2 viral copies this assay can detect is 250 copies / mL. A negative result does not preclude SARS-CoV-2 infection and should not be used as the sole basis for treatment or other patient management decisions.  A negative result may occur with improper specimen collection / handling, submission of specimen other than nasopharyngeal swab, presence of viral mutation(s) within the areas targeted by this assay, and inadequate number of viral copies (<250 copies / mL). A negative result must be combined with clinical observations, patient history, and epidemiological information.  Fact Sheet for Patients:   StrictlyIdeas.no  Fact Sheet for Healthcare Providers: BankingDealers.co.za  This test is not yet approved or  cleared by the [place] FDA and has been authorized for detection and/or diagnosis of SARS-CoV-2 by FDA under an Emergency Use Authorization (EUA).  This EUA will remain in effect (meaning this test can be used) for the duration of the COVID-19 declaration under Section 564(b)(1) of the Act, 21 U.S.C. section 360bbb-3(b)(1), unless the authorization is terminated or revoked sooner.  Performed at [facility], [address] [place], [place] [address]      Time coordinating discharge: Over 30 minutes  SIGNED:  Wyvonnia Dusky, MD  [facility] 04/30/2020, 12:03 PM Pager   If 7PM-7AM, please contact night-coverage [email or web address]

## 2020-04-30 NOTE — Progress Notes (Signed)
Discharge order received. Patient mental status is at baseline. Vital signs stable . No signs of acute distress. Discharge instructions given to [facility]. They requested this RN to give patient laxative, however patient refused and besides patient has not been eating solid food up until for lunch today.. No other issues noted at this time. Transported via EMS.

## 2020-04-30 NOTE — Progress Notes (Signed)
Mobility Specialist - Progress Note   04/30/20 1237  Mobility  Activity  (Bed exercises)  Range of Motion/Exercises Left leg;Right leg (ankle pumps, quad sets, straight leg raises, hip abd/add)  Level of Assistance Modified independent, requires aide device or extra time  Assistive Device None  Distance Ambulated (ft) 0 ft  Mobility Response Tolerated well  Mobility performed by Mobility specialist  $Mobility charge 1 Mobility    Pre-mobility: 74 HR, 113/44 BP, 95% SpO2 Post-mobility: 71 HR, 116/57 BP, 96% SpO2   Pt was lying in bed upon arrival. Pt agreed to session. Pt was modI in performing bed exercises: ankle pumps, quad sets (10x/leg), straight leg raises, and hip add/abd (10x/R LE). Overall, pt tolerated session well. Pt remains in bed with all needs in reach.    Kathee Delton Mobility Specialist 04/30/20, 12:48 PM

## 2020-04-30 NOTE — NC FL2 (Signed)
[place] LEVEL OF CARE SCREENING TOOL     IDENTIFICATION  Patient Name: Timothy Juarez Birthdate: Feb 24, 1951 Sex: male Admission Date (Current Location): 04/27/2020  [place] and [place] Number:  Engineering geologist and Address:  [facility], [address], [place], [place] [address]      Provider Number: [id number]  Attending Physician Name and Address:  Wyvonnia Dusky, MD  Relative Name and Phone Number:       Current Level of Care: Hospital Recommended Level of Care: [place] Prior Approval Number:    Date Approved/Denied:   PASRR Number: [id number]  Discharge Plan: SNF    Current Diagnoses: Patient Active Problem List   Diagnosis Date Noted  . Esophageal ulcer with bleeding 04/28/2020  . Hematemesis 04/27/2020  . Chronic diastolic CHF (congestive heart failure) ([place]) 04/27/2020  . Leukocytosis 04/27/2020  . Alcohol use 04/27/2020  . Pulmonary disease   . Hypotension 09/29/2019  . Cellulitis and abscess of leg 09/27/2019  . Infection of above knee amputation stump ([place]) 09/27/2019  . History of COVID-19 09/27/2019  . Abscess of left AKA stump 09/27/2019  . (HFpEF) heart failure with preserved ejection fraction ([place]) 09/27/2019  . COPD with chronic bronchitis ([place]) 09/27/2019  . Chronic anticoagulation with eliquis 09/27/2019  . Preoperative clearance 09/27/2019  . Acute on chronic respiratory failure with hypoxia ([place]) 12/27/2018  . Community acquired bilateral lower lobe pneumonia 12/27/2018  . COVID-19 virus infection 12/27/2018  . High anion gap metabolic acidosis 70/26/3785  . Severe sepsis ([place]) 12/27/2018  . AKI (acute kidney injury) ([place]) 12/27/2018  . Toxic encephalopathy 12/27/2018  . Acute respiratory failure with hypoxia ([place]) 12/27/2018  . Hx of AKA (above knee amputation), left ([place]) 11/02/2018  . Symptomatic anemia 10/10/2017  . Gastrointestinal hemorrhage   . Chronic deep vein  thrombosis (DVT) of left lower extremity ([place])   . Iron deficiency anemia 04/21/2017  . Essential hypertension 03/13/2017  . Recurrent deep vein thrombosis (DVT) ([place]) 03/11/2017  . Hyperlipidemia 11/04/2016  . Pressure ulcer 04/11/2016  . Pseudoaneurysm of left femoral artery ([place]) 04/11/2016  . PAD (peripheral artery disease) ([place]) 04/10/2016  . Atherosclerosis of native arteries of extremities with gangrene, left leg ([place]) 11/01/2015  . Ischemia of lower extremity 09/14/2015  . Atherosclerotic peripheral vascular disease with ulceration ([place]) 01/18/2015    Orientation RESPIRATION BLADDER Height & Weight     Self, Time, Situation, Place  Normal Continent Weight: 222 lb 7.1 oz (100.9 kg) Height:  6\' 1"  (185.4 cm)  BEHAVIORAL SYMPTOMS/MOOD NEUROLOGICAL BOWEL NUTRITION STATUS   (None)  (None) Continent Diet (See recommendations on discharge summary once available.)  AMBULATORY STATUS COMMUNICATION OF NEEDS Skin     Verbally Other (Comment) (Amputation, Cracking.)                       Personal Care Assistance Level of Assistance              Functional Limitations Info  Sight, Hearing, Speech Sight Info: Adequate Hearing Info: Adequate Speech Info: Adequate    SPECIAL CARE FACTORS FREQUENCY                       Contractures Contractures Info: Not present    Additional Factors Info  Code Status, Allergies Code Status Info: Full code Allergies Info: NKDA           Current Medications (04/30/2020):  This is the current  hospital active medication list Current Facility-Administered Medications  Medication Dose Route Frequency Provider Last Rate Last Admin  . acetaminophen (TYLENOL) tablet 650 mg  650 mg Oral Q6H PRN Ivor Costa, MD       Or  . acetaminophen (TYLENOL) suppository 650 mg  650 mg Rectal Q6H PRN Ivor Costa, MD      . acidophilus (RISAQUAD) capsule 1 capsule  1 capsule Oral BID Ivor Costa, MD   1 capsule at 04/30/20 1026  . albuterol  (PROVENTIL) (2.5 MG/3ML) 0.083% nebulizer solution 2.5 mg  2.5 mg Nebulization Q4H PRN Ivor Costa, MD      . ascorbic acid (VITAMIN C) tablet 500 mg  500 mg Oral BID Ivor Costa, MD   500 mg at 04/30/20 1026  . atorvastatin (LIPITOR) tablet 10 mg  10 mg Oral QHS Ivor Costa, MD   10 mg at 04/29/20 2134  . baclofen (LIORESAL) tablet 10 mg  10 mg Oral TID Ivor Costa, MD   10 mg at 04/30/20 1026  . dextrose 5 % solution   Intravenous Continuous Wyvonnia Dusky, MD 50 mL/hr at 04/30/20 0421 Rate Verify at 04/30/20 0421  . docusate sodium (COLACE) capsule 100 mg  100 mg Oral Daily PRN Ivor Costa, MD      . fentaNYL ([place]) 25 MCG/HR 1 patch  1 patch Transdermal Q72H Ivor Costa, MD   1 patch at 04/28/20 1205  . ferrous sulfate tablet 324 mg  324 mg Oral BID Ivor Costa, MD   324 mg at 04/29/20 2134  . fluticasone furoate-vilanterol (BREO ELLIPTA) 100-25 MCG/INH 1 puff  1 puff Inhalation Daily Ivor Costa, MD   1 puff at 04/30/20 1051   And  . umeclidinium bromide (INCRUSE ELLIPTA) 62.5 MCG/INH 1 puff  1 puff Inhalation Daily Ivor Costa, MD   1 puff at 04/30/20 1051  . gabapentin (NEURONTIN) capsule 100 mg  100 mg Oral QHS Ivor Costa, MD   100 mg at 04/28/20 2236  . guaiFENesin (ROBITUSSIN) 100 MG/5ML solution 300 mg  15 mL Oral TID PRN Ivor Costa, MD      . hydrALAZINE (APRESOLINE) injection 5 mg  5 mg Intravenous Q2H PRN Ivor Costa, MD      . loperamide (IMODIUM) capsule 2 mg  2 mg Oral TID PRN Ivor Costa, MD      . melatonin tablet 5 mg  5 mg Oral QHS PRN Ivor Costa, MD   5 mg at 04/29/20 2134  . metoCLOPramide (REGLAN) injection 10 mg  10 mg Intravenous Q6H PRN Lin Landsman, MD   10 mg at 04/27/20 2117  . morphine 2 MG/ML injection 1 mg  1 mg Intravenous Q4H PRN Ivor Costa, MD      . nitroGLYCERIN (NITROSTAT) SL tablet 0.4 mg  0.4 mg Sublingual Q5 min PRN Ivor Costa, MD      . omega-3 acid ethyl esters (LOVAZA) capsule 1 g  1 g Oral Daily Ivor Costa, MD   1 g at 04/30/20 1026  .  ondansetron (ZOFRAN) injection 4 mg  4 mg Intravenous Q8H PRN Ivor Costa, MD      . oxyCODONE (Oxy IR/ROXICODONE) immediate release tablet 10 mg  10 mg Oral Q4H PRN Ivor Costa, MD      . tamsulosin (FLOMAX) capsule 0.4 mg  0.4 mg Oral Daily Ivor Costa, MD   0.4 mg at 04/30/20 1026  . thiamine (B-1) injection 100 mg  100 mg Intravenous Daily Ivor Costa, MD   100  mg at 04/30/20 1026     Discharge Medications: Please see discharge summary for a list of discharge medications.  Relevant Imaging Results:  Relevant Lab Results:   Additional Information SS#: [id number]  Candie Chroman, LCSW

## 2020-04-30 NOTE — TOC Transition Note (Signed)
Transition of Care [facility]) - CM/SW Discharge Note   Patient Details  Name: MATTIE NOVOSEL MRN: [id number] Date of Birth: 1950/10/16  [facility] CM/SW Contact:  Candie Chroman, LCSW Phone Number: 04/30/2020, 2:46 PM   Clinical Narrative: Patient has orders to discharge back to [facility] today. RN has already called report. EMS transport has been arranged. Patient is next on the list. No further concerns. CSW signing off.    Final next level of care: Skilled Nursing Facility Barriers to Discharge: No Barriers Identified   Patient Goals and CMS Choice     Choice offered to / list presented to : NA  Discharge Placement   Existing PASRR number confirmed : 04/30/20          Patient [facility] bed at: [facility] Patient to be transferred to facility by: EMS Name of family member notified: Abbas Beyene Patient and family notified of of transfer: 04/30/20  Discharge Plan and Services     Post Acute Care Choice: Resumption of Svcs/PTA Provider                               Social Determinants of Health (SDOH) Interventions     Readmission Risk Interventions No flowsheet data found.

## 2020-05-01 ENCOUNTER — Telehealth: Payer: Self-pay

## 2020-05-01 NOTE — Telephone Encounter (Signed)
Tonya at [facility] called to scheduled patient a hospital follow up appointment. She said patient needs to been seen in 1 week per discharge. Informed tonya next available is 05/24/2020 at 1:15 she state she will have patient at the appointment

## 2020-05-02 LAB — CULTURE, BLOOD (ROUTINE X 2)
Culture: NO GROWTH
Culture: NO GROWTH
Special Requests: ADEQUATE
Special Requests: ADEQUATE

## 2020-05-07 ENCOUNTER — Other Ambulatory Visit: Payer: Self-pay

## 2020-05-07 ENCOUNTER — Ambulatory Visit (INDEPENDENT_AMBULATORY_CARE_PROVIDER_SITE_OTHER): Payer: Medicare Other | Admitting: Gastroenterology

## 2020-05-07 ENCOUNTER — Encounter: Payer: Self-pay | Admitting: Gastroenterology

## 2020-05-07 VITALS — BP 138/72 | HR 73 | Temp 97.2°F

## 2020-05-07 DIAGNOSIS — K2210 Ulcer of esophagus without bleeding: Secondary | ICD-10-CM

## 2020-05-07 DIAGNOSIS — R7989 Other specified abnormal findings of blood chemistry: Secondary | ICD-10-CM

## 2020-05-07 DIAGNOSIS — D513 Other dietary vitamin B12 deficiency anemia: Secondary | ICD-10-CM

## 2020-05-07 DIAGNOSIS — R945 Abnormal results of liver function studies: Secondary | ICD-10-CM

## 2020-05-07 DIAGNOSIS — K2211 Ulcer of esophagus with bleeding: Secondary | ICD-10-CM | POA: Diagnosis not present

## 2020-05-07 DIAGNOSIS — Z7289 Other problems related to lifestyle: Secondary | ICD-10-CM

## 2020-05-07 DIAGNOSIS — K209 Esophagitis, unspecified without bleeding: Secondary | ICD-10-CM

## 2020-05-07 DIAGNOSIS — Z789 Other specified health status: Secondary | ICD-10-CM

## 2020-05-07 DIAGNOSIS — D62 Acute posthemorrhagic anemia: Secondary | ICD-10-CM

## 2020-05-07 DIAGNOSIS — Z86718 Personal history of other venous thrombosis and embolism: Secondary | ICD-10-CM

## 2020-05-07 NOTE — Progress Notes (Signed)
Cephas Darby, MD [address]  [place]  [place], [place] [address]  Main: [phone]  Fax: [phone]    Gastroenterology Consultation  Referring Provider:     Alvester Morin* Primary Care Physician:  Alvester Morin, MD Primary Gastroenterologist:  Dr. Cephas Darby Reason for Consultation:     Hospital follow-up, hematemesis        HPI:   Timothy Juarez is a 69 y.o. male referred by Dr. Alvester Morin, MD  for consultation & management of recent hospitalization from 04/27/2020 to 04/30/2020 for massive hematemesis.  Patient has multiple comorbidities including HTN, HLD, COPD, dCHF, DVT on Eliquis, L AKA, GIB, alcohol abuse.  He underwent urgent upper endoscopy which revealed several esophageal ulcers, one large ulcer in the mid esophagus with an evident clot, hemospray was performed.  Patient was treated with IV PPI and was discharged on Protonix 40 mg p.o. twice daily.  His Eliquis has been held.  His hemoglobin at the time of discharge was 9.6, baseline is around 12  Interval summary Patient reports that, since hospital discharge, he has been doing well.  He reports his stools are brown in color.  He is taking Protonix 40 mg 2 times daily along with oral iron daily.  He denies heartburn, trouble swallowing, chest pain, epigastric pain.  His Eliquis is on hold.  He denies fatigue, shortness of breath.  [facility] lives at [facility]  NSAIDs: None  Antiplts/Anticoagulants/Anti thrombotics: Eliquis for history of DVT, currently on hold  GI Procedures: 05/25/17- EGD -hiatal hernia, diffuse candidiasis seen , dudoenal bx showed normal villu . Gastric bx showed erosive gastritis with intestinal metaplasia, negative for H pylori. Colonoscopy-polyps in transverse colon were tubulovillous with high grade dysplasia. Ascending and descending colon polyps were tubular adenoma. Some polyps were taken out piece meal   08/21/17- Capsule study was  incomplete as it did not exit the bowel and also had limited visualization . Poor bowel prep  Repeat EGD and colonoscopy October 14, 2017 with no gross lesion  Upper endoscopy 04/27/20 - Normal duodenal bulb and second portion of the duodenum. - Blood in the entire examined duodenum. - Red blood in the entire stomach. - Small hiatal hernia. - Esophageal ulcers with stigmata of recent bleeding. Hemostatic spray applied. - No specimens collected.   Past Medical History:  Diagnosis Date  . Acute embolism and thombos unsp deep vn unsp lower extremity ([place])   . Acute respiratory failure ([place])   . Allergy   . Anemia   . Aneurysm of unspecified site ([place])   . ARF (acute respiratory failure) ([place])    H/O  . Bronchitis   . CHF (congestive heart failure) ([place])   . COPD (chronic obstructive pulmonary disease) ([place])   . Cough   . Epistaxis   . GERD (gastroesophageal reflux disease)   . Gout   . Hyperlipidemia   . Hypertension   . Hypokalemia   . Insomnia   . Muscle contracture    MUSCLE SPASMS, muscle weakness  . Peripheral vascular disease ([place])   . Pneumonia   . Pressure ulcer     Past Surgical History:  Procedure Laterality Date  . AMPUTATION Left 09/19/2015   Procedure: AMPUTATION BELOW KNEE;  Surgeon: Algernon Huxley, MD;  Location: [facility] ORS;  Service: Vascular;  Laterality: Left;  . AMPUTATION Left 11/01/2015   Procedure: AMPUTATION ABOVE KNEE;  Surgeon: Algernon Huxley, MD;  Location: [facility] ORS;  Service: Vascular;  Laterality:  Left;  . AMPUTATION Left 09/29/2019   Procedure: IRRIGATION AND DEBRIDEMENT OF LEFT AKA;  Surgeon: Algernon Huxley, MD;  Location: [facility] ORS;  Service: General;  Laterality: Left;  . APPLICATION OF WOUND VAC Left 10/18/2015   Procedure: APPLICATION OF WOUND VAC;  Surgeon: Algernon Huxley, MD;  Location: [facility] ORS;  Service: Vascular;  Laterality: Left;  . APPLICATION OF WOUND VAC Left 09/30/2019   Procedure: APPLICATION OF WOUND VAC;  Surgeon: Katha Cabal, MD;   Location: [facility] ORS;  Service: Vascular;  Laterality: Left;  serial # [id number]  . CATARACT EXTRACTION W/PHACO Right 08/18/2019   Procedure: CATARACT EXTRACTION PHACO AND INTRAOCULAR LENS PLACEMENT ([place]) RIGHT;  Surgeon: Birder Robson, MD;  Location: [facility] ORS;  Service: Ophthalmology;  Laterality: Right;  [place] 03:29.0 CDE 45.68 Fluid Pack Lot # [id number]  . CENTRAL VENOUS CATHETER INSERTION Right 09/30/2019   Procedure: INSERTION CENTRAL LINE ADULT;  Surgeon: Katha Cabal, MD;  Location: [facility] ORS;  Service: Vascular;  Laterality: Right;  . COLONOSCOPY WITH PROPOFOL N/A 05/25/2017   Procedure: COLONOSCOPY WITH PROPOFOL;  Surgeon: Jonathon Bellows, MD;  Location[facility] ENDOSCOPY;  Service: Gastroenterology;  Laterality: N/A;  . COLONOSCOPY WITH PROPOFOL N/A 10/14/2017   Procedure: COLONOSCOPY WITH PROPOFOL;  Surgeon: Jonathon Bellows, MD;  Location[facility] ENDOSCOPY;  Service: Gastroenterology;  Laterality: N/A;  . ESOPHAGOGASTRODUODENOSCOPY (EGD) WITH PROPOFOL N/A 05/25/2017   Procedure: ESOPHAGOGASTRODUODENOSCOPY (EGD) WITH PROPOFOL;  Surgeon: Jonathon Bellows, MD;  Location: [facility] ENDOSCOPY;  Service: Gastroenterology;  Laterality: N/A;  . ESOPHAGOGASTRODUODENOSCOPY (EGD) WITH PROPOFOL N/A 10/14/2017   Procedure: ESOPHAGOGASTRODUODENOSCOPY (EGD) WITH PROPOFOL;  Surgeon: Jonathon Bellows, MD;  [facility];  Service: Gastroenterology;  Laterality: N/A;  . ESOPHAGOGASTRODUODENOSCOPY (EGD) WITH PROPOFOL N/A 04/27/2020   Procedure: ESOPHAGOGASTRODUODENOSCOPY (EGD) WITH PROPOFOL;  Surgeon: Lin Landsman, MD;  Location: [facility] ENDOSCOPY;  Service: Gastroenterology;  Laterality: N/A;  . EYE SURGERY    . GIVENS CAPSULE STUDY N/A 07/14/2017   Procedure: GIVENS CAPSULE STUDY;  Surgeon: Jonathon Bellows, MD;  Location: [facility] ENDOSCOPY;  Service: Gastroenterology;  Laterality: N/A;  . GIVENS CAPSULE STUDY N/A 10/30/2017   Procedure: GIVENS CAPSULE STUDY 12 HR;  Surgeon: Jonathon Bellows, MD;  Location[facility];   Service: Gastroenterology;  Laterality: N/A;  . IVC FILTER INSERTION N/A 10/12/2017   Procedure: IVC FILTER INSERTION;  Surgeon: Algernon Huxley, MD;  Location: [place] CV LAB;  Service: Cardiovascular;  Laterality: N/A;  . LOWER EXTREMITY ANGIOGRAPHY Right 10/04/2018   Procedure: LOWER EXTREMITY ANGIOGRAPHY;  Surgeon: Algernon Huxley, MD;  Location: [place] CV LAB;  Service: Cardiovascular;  Laterality: Right;  . PERIPHERAL VASCULAR CATHETERIZATION Left 01/18/2015   Procedure: Lower Extremity Angiography;  Surgeon: Algernon Huxley, MD;  Location: [place] CV LAB;  Service: Cardiovascular;  Laterality: Left;  . PERIPHERAL VASCULAR CATHETERIZATION N/A 01/18/2015   Procedure: Lower Extremity Intervention;  Surgeon: Algernon Huxley, MD;  Location: [place] CV LAB;  Service: Cardiovascular;  Laterality: N/A;  . PERIPHERAL VASCULAR CATHETERIZATION  07/30/2015   Procedure: Lower Extremity Intervention;  Surgeon: Algernon Huxley, MD;  Location: [place] CV LAB;  Service: Cardiovascular;;  . PERIPHERAL VASCULAR CATHETERIZATION N/A 07/30/2015   Procedure: Abdominal Aortogram w/Lower Extremity;  Surgeon: Algernon Huxley, MD;  Location: [place] CV LAB;  Service: Cardiovascular;  Laterality: N/A;  . PERIPHERAL VASCULAR CATHETERIZATION Left 08/22/2015   Procedure: Lower Extremity Angiography;  Surgeon: Algernon Huxley, MD;  Location: [place] CV LAB;  Service: Cardiovascular;  Laterality: Left;  .  PERIPHERAL VASCULAR CATHETERIZATION Left 08/22/2015   Procedure: Lower Extremity Intervention;  Surgeon: Algernon Huxley, MD;  Location: [place] [facility] CV LAB;  Service: Cardiovascular;  Laterality: Left;  . PERIPHERAL VASCULAR CATHETERIZATION Right 03/31/2016   Procedure: Lower Extremity Angiography;  Surgeon: Algernon Huxley, MD;  Location: [place] CV LAB;  Service: Cardiovascular;  Laterality: Right;  . PERIPHERAL VASCULAR CATHETERIZATION  03/31/2016   Procedure: Lower Extremity Intervention;  Surgeon: Algernon Huxley,  MD;  Location: [place] CV LAB;  Service: Cardiovascular;;  . PERIPHERAL VASCULAR CATHETERIZATION Left 04/10/2016   Procedure: Lower Extremity Angiography;  Surgeon: Algernon Huxley, MD;  Location: [place] CV LAB;  Service: Cardiovascular;  Laterality: Left;  [name] VACUUM ASSISTED CLOSURE CHANGE Left 10/03/2019   Procedure: LEFT THIGH VACUUM ASSISTED CLOSURE CHANGE;  Surgeon: Algernon Huxley, MD;  Location: [facility] ORS;  Service: General;  Laterality: Left;  . WOUND DEBRIDEMENT Left 10/18/2015   Procedure: DEBRIDEMENT WOUND   ( LEFT BKA DEBRIDEMENT );  Surgeon: Algernon Huxley, MD;  Location: [facility] ORS;  Service: Vascular;  Laterality: Left;    Current Outpatient Medications:  .  acetaminophen (TYLENOL) 325 MG tablet, Take 650 mg by mouth every 4 (four) hours as needed for fever. , Disp: , Rfl:  .  acidophilus (RISAQUAD) CAPS capsule, Take 1 capsule by mouth 2 (two) times daily., Disp: , Rfl:  .  ascorbic acid (VITAMIN C) 500 MG tablet, Take 500 mg by mouth 2 (two) times daily., Disp: , Rfl:  .  atorvastatin (LIPITOR) 10 MG tablet, Take 1 tablet (10 mg total) by mouth daily. (Patient taking differently: Take 10 mg by mouth at bedtime. ), Disp: 30 tablet, Rfl: 11 .  baclofen (LIORESAL) 10 MG tablet, Take 10 mg by mouth 3 (three) times daily. Hold for sedation, Disp: , Rfl:  .  docusate sodium (COLACE) 100 MG capsule, Take 100 mg by mouth daily as needed for mild constipation., Disp: , Rfl:  .  fentaNYL (DURAGESIC) 25 MCG/HR, Place 1 patch onto the skin every 3 (three) days. , Disp: , Rfl:  .  ferrous sulfate 324 (65 Fe) MG TBEC, Take 324 mg by mouth 2 (two) times daily. , Disp: , Rfl:  .  furosemide (LASIX) 40 MG tablet, Take 1 tablet (40 mg total) by mouth daily., Disp: 30 tablet, Rfl:  .  gabapentin (NEURONTIN) 100 MG capsule, Take 100 mg by mouth at bedtime. , Disp: , Rfl:  .  guaiFENesin (ROBITUSSIN) 100 MG/5ML SOLN, Take 15 mLs by mouth 3 (three) times daily as needed for cough (congestion). , Disp: ,  Rfl:  .  ipratropium-albuterol (DUONEB) 0.5-2.5 (3) MG/3ML SOLN, Take by nebulization 3 (three) times daily as needed., Disp: , Rfl:  .  loperamide (IMODIUM) 2 MG capsule, Take 2 mg by mouth 3 (three) times daily as needed for diarrhea or loose stools., Disp: , Rfl:  .  Melatonin 5 MG TABS, Take 5 mg by mouth at bedtime as needed (insomnia). , Disp: , Rfl:  .  nitroGLYCERIN (NITROSTAT) 0.4 MG SL tablet, Place 0.14 mg under the tongue every 5 (five) minutes as needed for chest pain., Disp: , Rfl:  .  Omega-3 Fatty Acids (FISH OIL) 1000 MG CAPS, Take 1,000 mg by mouth daily. , Disp: , Rfl:  .  Oxycodone HCl 10 MG TABS, Take 10 mg by mouth every 4 (four) hours as needed (pain). , Disp: , Rfl:  .  oxymetazoline (AFRIN) 0.05 % nasal spray, Place 2  sprays into both nostrils 2 (two) times daily as needed (For nose bleeds). , Disp: , Rfl:  .  pantoprazole (PROTONIX) 40 MG tablet, Take 1 tablet (40 mg total) by mouth 2 (two) times daily., Disp: 60 tablet, Rfl: 0 .  potassium chloride SA (K-DUR,KLOR-CON) 20 MEQ tablet, Take 20 mEq by mouth daily. With or after meal, Disp: , Rfl:  .  sodium chloride (OCEAN) 0.65 % SOLN nasal spray, Place 2 sprays into both nostrils 2 (two) times daily as needed for congestion., Disp: , Rfl:  .  tamsulosin (FLOMAX) 0.4 MG CAPS capsule, Take 1 capsule (0.4 mg total) by mouth daily., Disp: 30 capsule, Rfl: 0 .  TRELEGY ELLIPTA 100-62.5-25 MCG/INH AEPB, Inhale 1 puff into the lungs every morning. , Disp: , Rfl:  .  ammonium lactate (AMLACTIN) 12 % cream, Apply topically 2 (two) times daily. Apply to right foot (Patient not taking: Reported on 04/27/2020), Disp: , Rfl:  .  brimonidine-timolol (COMBIGAN) 0.2-0.5 % ophthalmic solution, Place 1 drop into the right eye 2 (two) times daily. (Patient not taking: Reported on 04/27/2020), Disp: , Rfl:  .  ciclopirox (PENLAC) 8 % solution, Apply 1 application topically daily. Apply solutions to toenails (Patient not taking: Reported on  05/07/2020), Disp: , Rfl:  .  Difluprednate (DUREZOL) 0.05 % EMUL, Place 1 drop into the right eye 3 (three) times daily.  (Patient not taking: Reported on 04/27/2020), Disp: , Rfl:  .  Difluprednate (DUREZOL) 0.05 % EMUL, Apply 1 drop to eye daily. (Patient not taking: Reported on 04/27/2020), Disp: , Rfl:  .  ELIQUIS 5 MG TABS tablet, Take 1 tablet (5 mg total) by mouth 2 (two) times daily. Hold off taking this medication until you see GI, Dr. Marius Ditch, in 1 week or whenever she tells you it is ok restart taking this medication, Disp: 60 tablet, Rfl: 0 .  Morphine Sulfate (MORPHINE CONCENTRATE) 10 mg / 0.5 ml concentrated solution, , Disp: , Rfl:  .  promethazine (PHENERGAN) 25 MG/ML injection, , Disp: , Rfl:  .  triamcinolone cream (KENALOG) 0.1 %, Apply 1 application topically 2 (two) times daily. (Patient not taking: Reported on 04/27/2020), Disp: , Rfl:     Family History  Problem Relation Age of Onset  . Heart attack Mother   . Varicose Veins Neg Hx      Social History   Tobacco Use  . Smoking status: Former Smoker    Packs/day: 1.00    Years: 44.00    Pack years: 44.00    Types: Cigarettes    Quit date: 11/17/2012    Years since quitting: 7.4  . Smokeless tobacco: Never Used  Vaping Use  . Vaping Use: Never used  Substance Use Topics  . Alcohol use: Yes  . Drug use: No    Allergies as of 05/07/2020  . (No Known Allergies)    Review of Systems:    All systems reviewed and negative except where noted in HPI.   Physical Exam:  BP 138/72 (BP Location: Left Arm, Patient Position: Sitting, Cuff Size: Normal)   Pulse 73   Temp (!) 97.2 F (36.2 C) (Oral)  No LMP for male patient.  General:   Alert,  Well-developed, well-nourished, pleasant and cooperative in NAD Head:  Normocephalic and atraumatic. Eyes:  Sclera clear, no icterus.   Conjunctiva pink. Ears:  Normal auditory acuity. Nose:  No deformity, discharge, or lesions. Mouth:  No deformity or lesions,oropharynx pink  & moist. Neck:  Supple; no  masses or thyromegaly. Lungs:  Respirations even and unlabored.  Clear throughout to auscultation.   No wheezes, crackles, or rhonchi. No acute distress. Heart:  Regular rate and rhythm; no murmurs, clicks, rubs, or gallops. Abdomen:  Normal bowel sounds. Soft, non-tender and non-distended without masses, hepatosplenomegaly or hernias noted.  No guarding or rebound tenderness.   Rectal: Not performed Msk:  Symmetrical without gross deformities. Good, equal movement & strength bilaterally. Pulses:  Normal pulses noted. Extremities:  2+ edema in right LE.  No cyanosis, left BKA. Neurologic:  Alert and oriented x3;  grossly normal neurologically. Skin:  Intact without significant lesions or rashes. No jaundice. Psych:  Alert and cooperative. Normal mood and affect.  Imaging Studies: Reviewed  Assessment and Plan:   Timothy Juarez is a 69 y.o. male with HTN, HLD, COPD, dCHF, DVT on Eliquis, L AKA, GIB, alcohol abuse with recent hospital admission for hematemesis secondary to bleeding esophageal ulcers and erosive esophagitis  Erosive esophagitis with esophageal ulcers No active bleeding at this time Continue Protonix 40 mg p.o. twice daily until esophagitis is completely healed Recheck CBC, iron panel, B12 and folate levels today.  If hemoglobin is improving, okay to restart Eliquis Recommend to repeat upper endoscopy in 6-8 weeks   History of recurrent DVT On chronic anticoagulation with Eliquis 5 mg twice daily Recommend follow-up hematology, it was suggested that may reduce to 2.5 mg at that time Hold Eliquis for now  Abnormal LFTs Check acute viral hepatitis panel  Follow up in 2 to 3 months   Cephas Darby, MD

## 2020-05-08 LAB — CBC
Hematocrit: 36.6 % — ABNORMAL LOW (ref 37.5–51.0)
Hemoglobin: 11.2 g/dL — ABNORMAL LOW (ref 13.0–17.7)
MCH: 26.8 pg (ref 26.6–33.0)
MCHC: 30.6 g/dL — ABNORMAL LOW (ref 31.5–35.7)
MCV: 88 fL (ref 79–97)
Platelets: 342 10*3/uL (ref 150–450)
RBC: 4.18 x10E6/uL (ref 4.14–5.80)
RDW: 16.1 % — ABNORMAL HIGH (ref 11.6–15.4)
WBC: 12.1 10*3/uL — ABNORMAL HIGH (ref 3.4–10.8)

## 2020-05-08 LAB — B12 AND FOLATE PANEL
Folate: 12.4 ng/mL (ref >3.0)
Vitamin B-12: 635 pg/mL (ref 232–1245)

## 2020-05-08 LAB — HEPATITIS PANEL, ACUTE
Hep A IgM: NEGATIVE
Hep B C IgM: NEGATIVE
Hep C Virus Ab: <0.1 s/co ratio (ref 0.0–0.9)
Hepatitis B Surface Ag: NEGATIVE

## 2020-05-08 LAB — IRON,TIBC AND FERRITIN PANEL
Ferritin: 60 ng/mL (ref 30–400)
Iron Saturation: 44 % (ref 15–55)
Iron: 132 ug/dL (ref 38–169)
Total Iron Binding Capacity: 300 ug/dL (ref 250–450)
UIBC: 168 ug/dL (ref 111–343)

## 2020-05-10 ENCOUNTER — Encounter: Payer: Self-pay | Admitting: Oncology

## 2020-05-10 ENCOUNTER — Other Ambulatory Visit: Payer: Self-pay

## 2020-05-10 ENCOUNTER — Inpatient Hospital Stay: Payer: Medicare Other | Attending: Oncology | Admitting: Oncology

## 2020-05-10 VITALS — BP 133/69 | HR 52 | Temp 97.8°F | Resp 18

## 2020-05-10 DIAGNOSIS — E876 Hypokalemia: Secondary | ICD-10-CM | POA: Diagnosis not present

## 2020-05-10 DIAGNOSIS — Z95828 Presence of other vascular implants and grafts: Secondary | ICD-10-CM

## 2020-05-10 DIAGNOSIS — D509 Iron deficiency anemia, unspecified: Secondary | ICD-10-CM | POA: Diagnosis present

## 2020-05-10 DIAGNOSIS — I10 Essential (primary) hypertension: Secondary | ICD-10-CM | POA: Diagnosis not present

## 2020-05-10 DIAGNOSIS — J449 Chronic obstructive pulmonary disease, unspecified: Secondary | ICD-10-CM | POA: Diagnosis not present

## 2020-05-10 DIAGNOSIS — Z7901 Long term (current) use of anticoagulants: Secondary | ICD-10-CM | POA: Insufficient documentation

## 2020-05-10 DIAGNOSIS — Z87891 Personal history of nicotine dependence: Secondary | ICD-10-CM | POA: Insufficient documentation

## 2020-05-10 DIAGNOSIS — Z79899 Other long term (current) drug therapy: Secondary | ICD-10-CM | POA: Diagnosis not present

## 2020-05-10 DIAGNOSIS — D6862 Lupus anticoagulant syndrome: Secondary | ICD-10-CM | POA: Insufficient documentation

## 2020-05-10 DIAGNOSIS — Z86718 Personal history of other venous thrombosis and embolism: Secondary | ICD-10-CM | POA: Insufficient documentation

## 2020-05-10 DIAGNOSIS — D500 Iron deficiency anemia secondary to blood loss (chronic): Secondary | ICD-10-CM | POA: Diagnosis not present

## 2020-05-10 DIAGNOSIS — I82409 Acute embolism and thrombosis of unspecified deep veins of unspecified lower extremity: Secondary | ICD-10-CM | POA: Diagnosis not present

## 2020-05-10 MED ORDER — APIXABAN 2.5 MG PO TABS: 2.5000 mg | ORAL_TABLET | Freq: Two times a day (BID) | ORAL | 3 refills | Status: AC

## 2020-05-10 NOTE — Progress Notes (Signed)
Patient is a resident at [facility] and brings in a copy of medication list for reconciliation.  Patient denies new problems/concerns today.

## 2020-05-10 NOTE — Progress Notes (Signed)
[facility] Follow up Visit:  Patient Care Team: Alvester Morin, MD as PCP - General (Family Medicine)  REASON FOR VISIT Follow up for treatment of Recurrent DVT, anticoagulation, iron deficiency anemia  PERTINENT HEMATOLOGY HISTORY [name] 69 y.o. male with PMH listed as below is here for evaluation of recurrent DVT and management. Patient is accompanied by RN from [facility].  Patient is a poor historian. He remembers having leg clots for more than one time. He remembers taking Eliquis in the past and recently his anticoagulation has been switched to [place].  2 Medical records from nursing home by patient's PCP Dr.Slade-Hartman, patient had been on Eliquis 5mg  BID for previous DVTs. His medical records showed he had acute DVT at the left common femoral vein on 11/26/2016, and he was placed on Eliquis 2.5mg  BID. Later when another venous doppler was obtained on 02/15/2017 due to persistent pain and swelling, doppler showed persist deep vein thrombosis involving proxima to distal femoral vein. Common femoral vein has normal compression. There was a note on the second doppler report on 03/03/2017 that patient was taking Eliquis 5mg  BID, which was stopped on 7/19 and patient was placed on Lovenox. Patient had swelling and pain of his left stump and symptoms does not improve with being on Lovenox. Xarelto 15mg  BID was started on 03/05/2017 and Lovenox was discontinued on 03/09/2017 with a plan to switch to Xarelto 20mg  daily after 21 days.  It was mentioned in nursing home note that patient had been on Warfarin previously and acquired blood clot while on warfarin. Patient does not remember this and the details of his?warfarin resistance is unclear.  3 His Lupus anticoagulant testing was positive due to prolonged DRVVT, although possibly falsely positive due to [place]. He was offered to be switched to Lovenox shots as alternative for  treatment of possible lupus anticoagulant hypercoagulable state, patient prefers to stay on Xalreto 20mg  daily and has been doing well on that.   Repeat anti phospholipid panel negative.    4 Iron deficiency anemia  Patient was admitted in Feb 2019 due to acute on chronic blood loss anemia, hemoglobin 5.3 on presentation.He was transfused with 2 units of PRBC and IV iron infusion..  Xarelto was discontinued due to GI bleeding.   IVC filter was placed on 2/25/ 2019 given patient's high risk of DVT recurrence.  He has had extensive GI work up including upper and lower endoscopy and capsule study, did not reveal active bleeding sites.   Plan to repeat EGD for gastric mapping and colonoscopy in 6-8 months due to poor prep in 09/2017   INTERVAL HISTORY Timothy Juarez is a 69 y.o. male who has above history reviewed by me today presents for follow up visit for iron deficiency anemia, and history of recurrent DVT He was last seen by me on 01/17/2019 and then he lost follow up . He is referred back to me by Dr.Vanga to re-establish care.  04/30/2020- 9/13/201 pt was hospitalized due to hematemesis with bright red blood, pain with swallowing,  GI work up showed esophageal ulcers w/ stigmata of recent bleeding. Pt did receive 3 days of IV protonix and will be d/c home w/ po pantoprazole 40mg  BID. Patient has been on Eliquis 5mg  BID prior to this hospitalization and eliquis was held. Patient followed up with Dr.Vanga post hospitalization and she repeated blood work.   Today patient reports feeling well. No additional hematemesis.  No swelling,  erythema, tenderness of left lower extremity stump.- he has previously developed recurrent DVT there.      Review of Systems  Constitutional: Negative for appetite change, chills, fatigue and fever.  HENT:   Negative for voice change.   Eyes: Negative for eye problems and icterus.  Respiratory: Negative for chest tightness, cough and shortness of breath.    Cardiovascular: Negative for chest pain and leg swelling.  Gastrointestinal: Negative for abdominal distention, abdominal pain, blood in stool, nausea and vomiting.  Endocrine: Negative for hot flashes.  Genitourinary: Negative for difficulty urinating, dysuria and frequency.   Musculoskeletal: Negative for arthralgias.       Left lower extremity stump no swelling  Skin: Negative for itching and rash.  Neurological: Negative for light-headedness and numbness.  Hematological: Negative for adenopathy. Does not bruise/bleed easily.  Psychiatric/Behavioral: Negative for confusion.    MEDICAL HISTORY: Past Medical History:  Diagnosis Date  . Acute embolism and thombos unsp deep vn unsp lower extremity ([place])   . Acute respiratory failure ([place])   . AKI (acute kidney injury) ([place]) 12/27/2018  . Allergy   . Anemia   . Aneurysm of unspecified site ([place])   . ARF (acute respiratory failure) ([place])    H/O  . Bronchitis   . Cellulitis and abscess of leg 09/27/2019  . CHF (congestive heart failure) ([place])   . COPD (chronic obstructive pulmonary disease) ([place])   . Cough   . Epistaxis   . Gastrointestinal hemorrhage   . GERD (gastroesophageal reflux disease)   . Gout   . Hematemesis 04/27/2020  . High anion gap metabolic acidosis 2/70/6237  . Hyperlipidemia   . Hypertension   . Hypokalemia   . Infection of above knee amputation stump ([place]) 09/27/2019  . Insomnia   . Iron deficiency anemia 04/21/2017  . Muscle contracture    MUSCLE SPASMS, muscle weakness  . Peripheral vascular disease ([place])   . Pneumonia   . Pressure ulcer   . Severe sepsis ([place]) 12/27/2018  . Toxic encephalopathy 12/27/2018    SURGICAL HISTORY: Past Surgical History:  Procedure Laterality Date  . AMPUTATION Left 09/19/2015   Procedure: AMPUTATION BELOW KNEE;  Surgeon: Algernon Huxley, MD;  Location: [facility] ORS;  Service: Vascular;  Laterality: Left;  . AMPUTATION Left 11/01/2015   Procedure: AMPUTATION ABOVE KNEE;  Surgeon: Algernon Huxley, MD;  Location: [facility] ORS;  Service: Vascular;  Laterality: Left;  . AMPUTATION Left 09/29/2019   Procedure: IRRIGATION AND DEBRIDEMENT OF LEFT AKA;  Surgeon: Algernon Huxley, MD;  Location: [facility];  Service: General;  Laterality: Left;  . APPLICATION OF WOUND VAC Left 10/18/2015   Procedure: APPLICATION OF WOUND VAC;  Surgeon: Algernon Huxley, MD;  Location: [facility] ORS;  Service: Vascular;  Laterality: Left;  . APPLICATION OF WOUND VAC Left 09/30/2019   Procedure: APPLICATION OF WOUND VAC;  Surgeon: Katha Cabal, MD;  Location: [facility];  Service: Vascular;  Laterality: Left;  serial # [id number]  . CATARACT EXTRACTION W/PHACO Right 08/18/2019   Procedure: CATARACT EXTRACTION PHACO AND INTRAOCULAR LENS PLACEMENT ([place]) RIGHT;  Surgeon: Birder Robson, MD;  Location: [facility];  Service: Ophthalmology;  Laterality: Right;  [place] 03:29.0 CDE 45.68 Fluid Pack Lot # [id number]  . CENTRAL VENOUS CATHETER INSERTION Right 09/30/2019   Procedure: INSERTION CENTRAL LINE ADULT;  Surgeon: Katha Cabal, MD;  Location: [facility] ORS;  Service: Vascular;  Laterality: Right;  . COLONOSCOPY WITH PROPOFOL N/A 05/25/2017   Procedure: COLONOSCOPY WITH PROPOFOL;  Surgeon: Jonathon Bellows, MD;  Location: [facility] ENDOSCOPY;  Service: Gastroenterology;  Laterality: N/A;  . COLONOSCOPY WITH PROPOFOL N/A 10/14/2017   Procedure: COLONOSCOPY WITH PROPOFOL;  Surgeon: Jonathon Bellows, MD;  Location: [facility] ENDOSCOPY;  Service: Gastroenterology;  Laterality: N/A;  . ESOPHAGOGASTRODUODENOSCOPY (EGD) WITH PROPOFOL N/A 05/25/2017   Procedure: ESOPHAGOGASTRODUODENOSCOPY (EGD) WITH PROPOFOL;  Surgeon: Jonathon Bellows, MD;  Location: [facility] ENDOSCOPY;  Service: Gastroenterology;  Laterality: N/A;  . ESOPHAGOGASTRODUODENOSCOPY (EGD) WITH PROPOFOL N/A 10/14/2017   Procedure: ESOPHAGOGASTRODUODENOSCOPY (EGD) WITH PROPOFOL;  Surgeon: Jonathon Bellows, MD;  Location: [facility] ENDOSCOPY;  Service: Gastroenterology;  Laterality: N/A;  . ESOPHAGOGASTRODUODENOSCOPY (EGD)  WITH PROPOFOL N/A 04/27/2020   Procedure: ESOPHAGOGASTRODUODENOSCOPY (EGD) WITH PROPOFOL;  Surgeon: Lin Landsman, MD;  Location: [facility] ENDOSCOPY;  Service: Gastroenterology;  Laterality: N/A;  . EYE SURGERY    . GIVENS CAPSULE STUDY N/A 07/14/2017   Procedure: GIVENS CAPSULE STUDY;  Surgeon: Jonathon Bellows, MD;  Location: [facility] ENDOSCOPY;  Service: Gastroenterology;  Laterality: N/A;  . GIVENS CAPSULE STUDY N/A 10/30/2017   Procedure: GIVENS CAPSULE STUDY 12 HR;  Surgeon: Jonathon Bellows, MD;  Location: [facility] ENDOSCOPY;  Service: Gastroenterology;  Laterality: N/A;  . IVC FILTER INSERTION N/A 10/12/2017   Procedure: IVC FILTER INSERTION;  Surgeon: Algernon Huxley, MD;  Location: [place] CV LAB;  Service: Cardiovascular;  Laterality: N/A;  . LOWER EXTREMITY ANGIOGRAPHY Right 10/04/2018   Procedure: LOWER EXTREMITY ANGIOGRAPHY;  Surgeon: Algernon Huxley, MD;  Location: [place] CV LAB;  Service: Cardiovascular;  Laterality: Right;  . PERIPHERAL VASCULAR CATHETERIZATION Left 01/18/2015   Procedure: Lower Extremity Angiography;  Surgeon: Algernon Huxley, MD;  Location: [place] CV LAB;  Service: Cardiovascular;  Laterality: Left;  . PERIPHERAL VASCULAR CATHETERIZATION N/A 01/18/2015   Procedure: Lower Extremity Intervention;  Surgeon: Algernon Huxley, MD;  Location: [place] CV LAB;  Service: Cardiovascular;  Laterality: N/A;  . PERIPHERAL VASCULAR CATHETERIZATION  07/30/2015   Procedure: Lower Extremity Intervention;  Surgeon: Algernon Huxley, MD;  Location: [place] CV LAB;  Service: Cardiovascular;;  . PERIPHERAL VASCULAR CATHETERIZATION N/A 07/30/2015   Procedure: Abdominal Aortogram w/Lower Extremity;  Surgeon: Algernon Huxley, MD;  Location: [place] CV LAB;  Service: Cardiovascular;  Laterality: N/A;  . PERIPHERAL VASCULAR CATHETERIZATION Left 08/22/2015   Procedure: Lower Extremity Angiography;  Surgeon: Algernon Huxley, MD;  Location: [place] CV LAB;  Service: Cardiovascular;  Laterality:  Left;  . PERIPHERAL VASCULAR CATHETERIZATION Left 08/22/2015   Procedure: Lower Extremity Intervention;  Surgeon: Algernon Huxley, MD;  Location: [place] CV LAB;  Service: Cardiovascular;  Laterality: Left;  . PERIPHERAL VASCULAR CATHETERIZATION Right 03/31/2016   Procedure: Lower Extremity Angiography;  Surgeon: Algernon Huxley, MD;  Location: [place] CV LAB;  Service: Cardiovascular;  Laterality: Right;  . PERIPHERAL VASCULAR CATHETERIZATION  03/31/2016   Procedure: Lower Extremity Intervention;  Surgeon: Algernon Huxley, MD;  Location: [place] CV LAB;  Service: Cardiovascular;;  . PERIPHERAL VASCULAR CATHETERIZATION Left 04/10/2016   Procedure: Lower Extremity Angiography;  Surgeon: Algernon Huxley, MD;  Location: [place] CV LAB;  Service: Cardiovascular;  Laterality: Left;  [name] VACUUM ASSISTED CLOSURE CHANGE Left 10/03/2019   Procedure: LEFT THIGH VACUUM ASSISTED CLOSURE CHANGE;  Surgeon: Algernon Huxley, MD;  Location: [facility] ORS;  Service: General;  Laterality: Left;  . WOUND DEBRIDEMENT Left 10/18/2015   Procedure: DEBRIDEMENT WOUND   ( LEFT BKA DEBRIDEMENT );  Surgeon: Algernon Huxley, MD;  Location: [facility] ORS;  Service: Vascular;  Laterality: Left;    SOCIAL HISTORY: Social History   Socioeconomic History  . Marital status: Legally Separated    Spouse name: Not on file  . Number of children: Not on file  . Years of education: Not on file  . Highest education level: Not on file  Occupational History  . Not on file  Tobacco Use  . Smoking status: Former Smoker    Packs/day: 1.00    Years: 44.00    Pack years: 44.00    Types: Cigarettes    Quit date: 11/17/2012    Years since quitting: 7.4  . Smokeless tobacco: Never Used  Vaping Use  . Vaping Use: Never used  Substance and Sexual Activity  . Alcohol use: Yes  . Drug use: No  . Sexual activity: Never  Other Topics Concern  . Not on file  Social History Narrative  . Not on file   Social Determinants of Health   Financial  Resource Strain:   . Difficulty of Paying Living Expenses: Not on file  Food Insecurity:   . Worried About Charity fundraiser in the Last Year: Not on file  . Ran Out of Food in the Last Year: Not on file  Transportation Needs:   . Lack of Transportation (Medical): Not on file  . Lack of Transportation (Non-Medical): Not on file  Physical Activity:   . Days of Exercise per Week: Not on file  . Minutes of Exercise per Session: Not on file  Stress:   . Feeling of Stress : Not on file  Social Connections:   . Frequency of Communication with Friends and Family: Not on file  . Frequency of Social Gatherings with Friends and Family: Not on file  . Attends Religious Services: Not on file  . Active Member of Clubs or Organizations: Not on file  . Attends Archivist Meetings: Not on file  . Marital Status: Not on file  Intimate Partner Violence:   . Fear of Current or Ex-Partner: Not on file  . Emotionally Abused: Not on file  . Physically Abused: Not on file  . Sexually Abused: Not on file    FAMILY HISTORY: Mother died from an MI. Father died from a brain tumor  ALLERGIES:  has No Known Allergies.  MEDICATIONS:  Current Outpatient Medications  Medication Sig Dispense Refill  . acetaminophen (TYLENOL) 325 MG tablet Take 650 mg by mouth every 4 (four) hours as needed for fever.     [name] acidophilus (RISAQUAD) CAPS capsule Take 1 capsule by mouth 2 (two) times daily.    [name] ascorbic acid (VITAMIN C) 500 MG tablet Take 500 mg by mouth 2 (two) times daily.    [name] atorvastatin (LIPITOR) 10 MG tablet Take 1 tablet (10 mg total) by mouth daily. (Patient taking differently: Take 10 mg by mouth at bedtime. ) 30 tablet 11  . baclofen (LIORESAL) 10 MG tablet Take 10 mg by mouth 3 (three) times daily. Hold for sedation    . docusate sodium (COLACE) 100 MG capsule Take 100 mg by mouth daily as needed for mild constipation.    . fentaNYL (DURAGESIC) 25 MCG/HR Place 1 patch onto the skin every 3  (three) days.     . ferrous sulfate 324 (65 Fe) MG TBEC Take 324 mg by mouth 2 (two) times daily.     . furosemide (LASIX) 40 MG tablet Take 1 tablet (40 mg total) by mouth daily. 30 tablet   . gabapentin (NEURONTIN) 100  MG capsule Take 100 mg by mouth at bedtime.     [name] guaiFENesin (ROBITUSSIN) 100 MG/5ML SOLN Take 15 mLs by mouth 3 (three) times daily as needed for cough (congestion).     [name] loperamide (IMODIUM) 2 MG capsule Take 2 mg by mouth 3 (three) times daily as needed for diarrhea or loose stools.    . Melatonin 5 MG TABS Take 5 mg by mouth at bedtime as needed (insomnia).     . nitroGLYCERIN (NITROSTAT) 0.4 MG SL tablet Place 0.14 mg under the tongue every 5 (five) minutes as needed for chest pain.    . Omega-3 Fatty Acids (FISH OIL) 1000 MG CAPS Take 1,000 mg by mouth daily.     . Oxycodone HCl 10 MG TABS Take 10 mg by mouth every 4 (four) hours as needed (pain).     [name] oxymetazoline (AFRIN) 0.05 % nasal spray Place 2 sprays into both nostrils 2 (two) times daily as needed (For nose bleeds).     . pantoprazole (PROTONIX) 40 MG tablet Take 1 tablet (40 mg total) by mouth 2 (two) times daily. 60 tablet 0  . potassium chloride SA (K-DUR,KLOR-CON) 20 MEQ tablet Take 20 mEq by mouth daily. With or after meal    . sodium chloride (OCEAN) 0.65 % SOLN nasal spray Place 2 sprays into both nostrils 2 (two) times daily as needed for congestion.    . tamsulosin (FLOMAX) 0.4 MG CAPS capsule Take 1 capsule (0.4 mg total) by mouth daily. 30 capsule 0  . TRELEGY ELLIPTA 100-62.5-25 MCG/INH AEPB Inhale 1 puff into the lungs every morning.     [name] ammonium lactate (AMLACTIN) 12 % cream Apply topically 2 (two) times daily. Apply to right foot (Patient not taking: Reported on 04/27/2020)    . apixaban (ELIQUIS) 2.5 MG TABS tablet Take 1 tablet (2.5 mg total) by mouth 2 (two) times daily. 60 tablet 3  . brimonidine-timolol (COMBIGAN) 0.2-0.5 % ophthalmic solution Place 1 drop into the right eye 2 (two) times  daily. (Patient not taking: Reported on 04/27/2020)    . ciclopirox (PENLAC) 8 % solution Apply 1 application topically daily. Apply solutions to toenails (Patient not taking: Reported on 05/07/2020)    . Difluprednate (DUREZOL) 0.05 % EMUL Place 1 drop into the right eye 3 (three) times daily.  (Patient not taking: Reported on 04/27/2020)    . Difluprednate (DUREZOL) 0.05 % EMUL Apply 1 drop to eye daily. (Patient not taking: Reported on 04/27/2020)    . ipratropium-albuterol (DUONEB) 0.5-2.5 (3) MG/3ML SOLN Take by nebulization 3 (three) times daily as needed. (Patient not taking: Reported on 05/10/2020)    . Morphine Sulfate (MORPHINE CONCENTRATE) 10 mg / 0.5 ml concentrated solution  (Patient not taking: Reported on 04/27/2020)    . promethazine (PHENERGAN) 25 MG/ML injection  (Patient not taking: Reported on 05/07/2020)    . triamcinolone cream (KENALOG) 0.1 % Apply 1 application topically 2 (two) times daily. (Patient not taking: Reported on 04/27/2020)     No current facility-administered medications for this visit.    PHYSICAL EXAMINATION:  ECOG PERFORMANCE STATUS: 1 - Symptomatic but completely ambulatory  Vitals:   05/10/20 1001  BP: 133/69  Pulse: (!) 52  Resp: 18  Temp: 97.8 F (36.6 C)    There were no vitals filed for this visit. Physical Exam Constitutional:      General: He is not in acute distress.    Appearance: He is not diaphoretic.  HENT:  Head: Normocephalic and atraumatic.     Nose: Nose normal.     Mouth/Throat:     Pharynx: No oropharyngeal exudate.  Eyes:     General: No scleral icterus.       Left eye: No discharge.     Pupils: Pupils are equal, round, and reactive to light.  Neck:     Vascular: No JVD.  Cardiovascular:     Rate and Rhythm: Normal rate and regular rhythm.     Heart sounds: Normal heart sounds. No murmur heard.   Pulmonary:     Effort: Pulmonary effort is normal. No respiratory distress.     Breath sounds: Normal breath sounds. No  wheezing or rales.  Chest:     Chest wall: No tenderness.  Abdominal:     General: Bowel sounds are normal. There is no distension.     Palpations: Abdomen is soft. There is no mass.     Tenderness: There is no abdominal tenderness.  Musculoskeletal:        General: No tenderness. Normal range of motion.     Cervical back: Normal range of motion and neck supple.     Comments: left above knee amputation stump non tender or swelling. No edema at right lower extremity  Lymphadenopathy:     Cervical: No cervical adenopathy.  Skin:    General: Skin is warm and dry.     Findings: No erythema or rash.  Neurological:     Mental Status: He is alert and oriented to person, place, and time.     Cranial Nerves: No cranial nerve deficit.     Motor: No abnormal muscle tone.     Coordination: Coordination normal.       LABORATORY DATA: I have personally reviewed the data as listed:  Office Visit on 05/07/2020  Component Date Value Ref Range Status  . Vitamin B-12 05/07/2020 635  232 - 1,245 pg/mL Final  . Folate 05/07/2020 12.4  >3.0 ng/mL Final   Comment: A serum folate concentration of less than 3.1 ng/mL is considered to represent clinical deficiency.   . Total Iron Binding Capacity 05/07/2020 300  250 - 450 ug/dL Final  . UIBC 05/07/2020 168  111 - 343 ug/dL Final  . Iron 05/07/2020 132  38 - 169 ug/dL Final  . Iron Saturation 05/07/2020 44  15 - 55 % Final  . Ferritin 05/07/2020 60  30.0 - 400.0 ng/mL Final  . Hep A IgM 05/07/2020 Negative  Negative Final  . Hepatitis B Surface Ag 05/07/2020 Negative  Negative Final  . Hep B C IgM 05/07/2020 Negative  Negative Final  . Hep C Virus Ab 05/07/2020 <0.1  0.0 - 0.9 s/co ratio Final   Comment:                                   Negative:     < 0.8                              Indeterminate: 0.8 - 0.9                                   Positive:     > 0.9  The CDC recommends that a positive HCV antibody result  be followed up with a HCV  Nucleic Acid Amplification  test (160737).   . WBC 05/07/2020 12.1* 3.4 - 10.8 x10E3/uL Final  . RBC 05/07/2020 4.18  4.14 - 5.80 x10E6/uL Final  . Hemoglobin 05/07/2020 11.2* 13.0 - 17.7 g/dL Final  . Hematocrit 05/07/2020 36.6* 37.5 - 51.0 % Final  . MCV 05/07/2020 88  79 - 97 fL Final  . MCH 05/07/2020 26.8  26.6 - 33.0 pg Final  . MCHC 05/07/2020 30.6* 31 - 35 g/dL Final  . RDW 05/07/2020 16.1* 11.6 - 15.4 % Final  . Platelets 05/07/2020 342  150 - 450 x10E3/uL Final  Admission on 04/27/2020, Discharged on 04/30/2020  Component Date Value Ref Range Status  . Troponin I (High Sensitivity) 04/26/2020 9  <18 ng/L Final   Comment: (NOTE) Elevated high sensitivity troponin I (hsTnI) values and significant  changes across serial measurements may suggest ACS but many other  chronic and acute conditions are known to elevate hsTnI results.  Refer to the "Links" section for chest pain algorithms and additional  guidance. Performed at [facility], [address], [place], [place] [address]   . WBC 04/26/2020 23.5* 4.0 - 10.5 K/uL Final  . RBC 04/26/2020 5.85* 4.22 - 5.81 MIL/uL Final  . Hemoglobin 04/26/2020 16.2  13.0 - 17.0 g/dL Final  . HCT 04/26/2020 48.1  39 - 52 % Final  . MCV 04/26/2020 82.2  80.0 - 100.0 fL Final  . MCH 04/26/2020 27.7  26.0 - 34.0 pg Final  . MCHC 04/26/2020 33.7  30.0 - 36.0 g/dL Final  . RDW 04/26/2020 17.1* 11.5 - 15.5 % Final  . Platelets 04/26/2020 279  150 - 400 K/uL Final  . nRBC 04/26/2020 0.0  0.0 - 0.2 % Final  . Neutrophils Relative % 04/26/2020 83  % Final  . Neutro Abs 04/26/2020 19.9* 1.7 - 7.7 K/uL Final  . Lymphocytes Relative 04/26/2020 9  % Final  . Lymphs Abs 04/26/2020 2.1  0.7 - 4.0 K/uL Final  . Monocytes Relative 04/26/2020 5  % Final  . Monocytes Absolute 04/26/2020 1.2* 0 - 1 K/uL Final  . Eosinophils Relative 04/26/2020 1  % Final  . Eosinophils Absolute 04/26/2020 0.2  0 - 0 K/uL Final  . Basophils Relative 04/26/2020  1  % Final  . Basophils Absolute 04/26/2020 0.1  0 - 0 K/uL Final  . Immature Granulocytes 04/26/2020 1  % Final  . Abs Immature Granulocytes 04/26/2020 0.15* 0.00 - 0.07 K/uL Final   Performed at [facility], [address] [place], [place] [address]  . Sodium 04/26/2020 140  135 - 145 mmol/L Final  . Potassium 04/26/2020 3.8  3.5 - 5.1 mmol/L Final  . Chloride 04/26/2020 103  98 - 111 mmol/L Final  . CO2 04/26/2020 26  22 - 32 mmol/L Final  . Glucose, Bld 04/26/2020 136* 70 - 99 mg/dL Final   Glucose reference range applies only to samples taken after fasting for at least 8 hours.  . BUN 04/26/2020 21  8 - 23 mg/dL Final  . Creatinine, Ser 04/26/2020 1.14  0.61 - 1.24 mg/dL Final  . Calcium 04/26/2020 8.9  8.9 - 10.3 mg/dL Final  . Total Protein 04/26/2020 8.2* 6.5 - 8.1 g/dL Final  . Albumin 04/26/2020 4.6  3.5 - 5.0 g/dL Final  . AST 04/26/2020 23  15 - 41 U/L Final  . ALT 04/26/2020 22  0 - 44 U/L Final  . Alkaline Phosphatase 04/26/2020 113  38 - 126 U/L Final  .  Total Bilirubin 04/26/2020 1.5* 0.3 - 1.2 mg/dL Final  . GFR calc non Af Amer 04/26/2020 >60  >60 mL/min Final  . GFR calc Af Amer 04/26/2020 >60  >60 mL/min Final  . Anion gap 04/26/2020 11  5 - 15 Final   Performed at [facility], [address], [place], [place] [address]  . ABO/RH(D) 04/26/2020 A POS   Final  . Antibody Screen 04/26/2020 NEG   Final  . Sample Expiration 04/26/2020    Final                   Value:04/29/2020,2359 Performed at [facility], [address], [place], [place] [address]   . Lipase 04/26/2020 21  11 - 51 U/L Final   Performed at [facility], [address] [place], [place] [address]  . Troponin I (High Sensitivity) 04/27/2020 12  <18 ng/L Final   Comment: (NOTE) Elevated high sensitivity troponin I (hsTnI) values and significant  changes across serial measurements may suggest ACS but many other  chronic and acute conditions are known to  elevate hsTnI results.  Refer to the "Links" section for chest pain algorithms and additional  guidance. Performed at [facility], [address], [place], [place] [address]   . Specimen Description 04/27/2020 BLOOD RIGHT HAND   Final  . Special Requests 04/27/2020 BOTTLES DRAWN AEROBIC AND ANAEROBIC Blood Culture adequate volume   Final  . Culture 04/27/2020    Final                   Value:NO GROWTH 5 DAYS Performed at [facility], [place] [place], [place] [address]   . Report Status 04/27/2020 05/02/2020 FINAL   Final  . Specimen Description 04/27/2020 BLOOD RIGHT WRIST   Final  . Special Requests 04/27/2020 BOTTLES DRAWN AEROBIC AND ANAEROBIC Blood Culture adequate volume   Final  . Culture 04/27/2020    Final                   Value:NO GROWTH 5 DAYS Performed at [facility], [address] [place], [place] [address]   . Report Status 04/27/2020 05/02/2020 FINAL   Final  . Lactic Acid, Venous 04/27/2020 1.8  0.5 - 1.9 mmol/L Final   Performed at [facility], [address] [place], [place] [address]  . Lactic Acid, Venous 04/27/2020 2.0* 0.5 - 1.9 mmol/L Final   Comment: CRITICAL RESULT CALLED TO, READ BACK BY AND VERIFIED WITH SIERRA CLOER AT 1538 04/27/20.PMF Performed at [facility], [address], [place], [place] [address]   . Alcohol, Ethyl (B) 04/27/2020 <10  <10 mg/dL Final   Comment: (NOTE) Lowest detectable limit for serum alcohol is 10 mg/dL.  For medical purposes only. Performed at [facility], [address], [place],  [address]   . SARS Coronavirus 2 04/27/2020 NEGATIVE  NEGATIVE Final   Comment: (NOTE) SARS-CoV-2 target nucleic acids are NOT DETECTED.  The SARS-CoV-2 RNA is generally detectable in upper and lower respiratory specimens during the acute phase of infection. The lowest concentration of SARS-CoV-2 viral copies this assay can detect is 250 copies / mL. A negative  result does not preclude SARS-CoV-2 infection and should not be used as the sole basis for treatment or other patient management decisions.  A negative result may occur with improper specimen collection / handling, submission of specimen other than nasopharyngeal swab, presence of viral mutation(s) within the areas targeted  by this assay, and inadequate number of viral copies (<250 copies / mL). A negative result must be combined with clinical observations, patient history, and epidemiological information.  Fact Sheet for Patients:   StrictlyIdeas.no  Fact Sheet for Healthcare Providers: BankingDealers.co.za  This test is not yet approved or                           cleared by the [place] FDA and has been authorized for detection and/or diagnosis of SARS-CoV-2 by FDA under an Emergency Use Authorization (EUA).  This EUA will remain in effect (meaning this test can be used) for the duration of the COVID-19 declaration under Section 564(b)(1) of the Act, 21 U.S.C. section 360bbb-3(b)(1), unless the authorization is terminated or revoked sooner.  Performed at [facility], [address], [place], [place] [address]   . B Natriuretic Peptide 04/27/2020 67.5  0.0 - 100.0 pg/mL Final   Performed at [facility], [address] [place], [place] [address]  . WBC 04/27/2020 21.8* 4.0 - 10.5 K/uL Final  . RBC 04/27/2020 4.56  4.22 - 5.81 MIL/uL Final  . Hemoglobin 04/27/2020 12.6* 13.0 - 17.0 g/dL Final  . HCT 04/27/2020 40.1  39 - 52 % Final  . MCV 04/27/2020 87.9  80.0 - 100.0 fL Final  . MCH 04/27/2020 27.6  26.0 - 34.0 pg Final  . MCHC 04/27/2020 31.4  30.0 - 36.0 g/dL Final  . RDW 04/27/2020 16.5* 11.5 - 15.5 % Final  . Platelets 04/27/2020 238  150 - 400 K/uL Final  . nRBC 04/27/2020 0.0  0.0 - 0.2 % Final   Performed at [facility], [address], [place], [place] [address]  . WBC 04/27/2020  19.4* 4.0 - 10.5 K/uL Final  . RBC 04/27/2020 3.97* 4.22 - 5.81 MIL/uL Final  . Hemoglobin 04/27/2020 11.0* 13.0 - 17.0 g/dL Final  . HCT 04/27/2020 33.7* 39 - 52 % Final  . MCV 04/27/2020 84.9  80.0 - 100.0 fL Final  . MCH 04/27/2020 27.7  26.0 - 34.0 pg Final  . MCHC 04/27/2020 32.6  30.0 - 36.0 g/dL Final  . RDW 04/27/2020 16.4* 11.5 - 15.5 % Final  . Platelets 04/27/2020 209  150 - 400 K/uL Final  . nRBC 04/27/2020 0.0  0.0 - 0.2 % Final   Performed at [facility], [address], [place], [place] [address]  . Prothrombin Time 04/27/2020 14.8  11.4 - 15.2 seconds Final  . INR 04/27/2020 1.2  0.8 - 1.2 Final   Comment: (NOTE) INR goal varies based on device and disease states. Performed at [facility], [address], [place], [place] [address]   . aPTT 04/27/2020 30  24 - 36 seconds Final   Performed at [facility], [address] [place],  [address]  . Procalcitonin 04/27/2020 0.26  ng/mL Final   Comment:        Interpretation: PCT (Procalcitonin) <= 0.5 ng/mL: Systemic infection (sepsis) is not likely. Local bacterial infection is possible. (NOTE)       Sepsis PCT Algorithm           Lower Respiratory Tract                                      Infection PCT Algorithm    ----------------------------     ----------------------------  PCT < 0.25 ng/mL                PCT < 0.10 ng/mL          Strongly encourage             Strongly discourage   discontinuation of antibiotics    initiation of antibiotics    ----------------------------     -----------------------------       PCT 0.25 - 0.50 ng/mL            PCT 0.10 - 0.25 ng/mL               OR       >80% decrease in PCT            Discourage initiation of                                            antibiotics      Encourage discontinuation           of antibiotics    ----------------------------     -----------------------------         PCT >= 0.50 ng/mL              PCT  0.26 - 0.50 ng/mL               AND                                 <80% decrease in PCT             Encourage initiation of                                             antibiotics       Encourage continuation           of antibiotics    ----------------------------     -----------------------------        PCT >= 0.50 ng/mL                  PCT > 0.50 ng/mL               AND         increase in PCT                  Strongly encourage                                      initiation of antibiotics    Strongly encourage escalation           of antibiotics                                     -----------------------------                                           PCT <= 0.25 ng/mL  OR                                        > 80% decrease in PCT                                      Discontinue / Do not initiate                                             antibiotics  Performed at [facility], [address] [place], [place] [address]   . HIV Screen 4th Generation wRfx 04/28/2020 Non Reactive  Non Reactive Final   Performed at [facility], [address] [place], [place] [address]  . WBC 04/28/2020 18.3* 4.0 - 10.5 K/uL Final  . RBC 04/28/2020 3.63* 4.22 - 5.81 MIL/uL Final  . Hemoglobin 04/28/2020 10.2* 13.0 - 17.0 g/dL Final  . HCT 04/28/2020 30.1* 39 - 52 % Final  . MCV 04/28/2020 82.9  80.0 - 100.0 fL Final  . MCH 04/28/2020 28.1  26.0 - 34.0 pg Final  . MCHC 04/28/2020 33.9  30.0 - 36.0 g/dL Final  . RDW 04/28/2020 16.5* 11.5 - 15.5 % Final  . Platelets 04/28/2020 180  150 - 400 K/uL Final  . nRBC 04/28/2020 0.0  0.0 - 0.2 % Final   Performed at [facility], [address] [place], [place] [address]  . Sodium 04/28/2020 146* 135 - 145 mmol/L Final  . Potassium 04/28/2020 3.8  3.5 - 5.1 mmol/L Final  . Chloride 04/28/2020 119* 98 - 111 mmol/L Final  . CO2 04/28/2020 21* 22 - 32 mmol/L Final  .  Glucose, Bld 04/28/2020 121* 70 - 99 mg/dL Final   Glucose reference range applies only to samples taken after fasting for at least 8 hours.  . BUN 04/28/2020 53* 8 - 23 mg/dL Final  . Creatinine, Ser 04/28/2020 1.25* 0.61 - 1.24 mg/dL Final  . Calcium 04/28/2020 8.0* 8.9 - 10.3 mg/dL Final  . GFR calc non Af Amer 04/28/2020 58* >60 mL/min Final  . GFR calc Af Amer 04/28/2020 >60  >60 mL/min Final  . Anion gap 04/28/2020 6  5 - 15 Final   Performed at [facility], [address], [place], [place] [address]  . WBC 04/28/2020 18.7* 4.0 - 10.5 K/uL Final  . RBC 04/28/2020 3.59* 4.22 - 5.81 MIL/uL Final  . Hemoglobin 04/28/2020 10.1* 13.0 - 17.0 g/dL Final  . HCT 04/28/2020 30.0* 39 - 52 % Final  . MCV 04/28/2020 83.6  80.0 - 100.0 fL Final  . MCH 04/28/2020 28.1  26.0 - 34.0 pg Final  . MCHC 04/28/2020 33.7  30.0 - 36.0 g/dL Final  . RDW 04/28/2020 16.7* 11.5 - 15.5 % Final  . Platelets 04/28/2020 192  150 - 400 K/uL Final  . nRBC 04/28/2020 0.0  0.0 - 0.2 % Final   Performed at [facility], [address] [place], [place] [address]  . WBC 04/28/2020 18.7* 4.0 - 10.5 K/uL Final  . RBC 04/28/2020 3.42* 4.22 - 5.81 MIL/uL Final  . Hemoglobin 04/28/2020 9.5* 13.0 - 17.0 g/dL Final  . HCT 04/28/2020 29.8* 39 -  52 % Final  . MCV 04/28/2020 87.1  80.0 - 100.0 fL Final  . MCH 04/28/2020 27.8  26.0 - 34.0 pg Final  . MCHC 04/28/2020 31.9  30.0 - 36.0 g/dL Final  . RDW 04/28/2020 16.9* 11.5 - 15.5 % Final  . Platelets 04/28/2020 176  150 - 400 K/uL Final  . nRBC 04/28/2020 0.0  0.0 - 0.2 % Final   Performed at [facility], [address], [place], [place] [address]  . WBC 04/28/2020 20.2* 4.0 - 10.5 K/uL Final  . RBC 04/28/2020 3.65* 4.22 - 5.81 MIL/uL Final  . Hemoglobin 04/28/2020 10.4* 13.0 - 17.0 g/dL Final  . HCT 04/28/2020 30.4* 39 - 52 % Final  . MCV 04/28/2020 83.3  80.0 - 100.0 fL Final  . MCH 04/28/2020 28.5  26.0 - 34.0 pg Final  . MCHC 04/28/2020  34.2  30.0 - 36.0 g/dL Final  . RDW 04/28/2020 16.8* 11.5 - 15.5 % Final  . Platelets 04/28/2020 158  150 - 400 K/uL Final  . nRBC 04/28/2020 0.0  0.0 - 0.2 % Final   Performed at [facility], [address], [place], [place] [address]  . Glucose-Capillary 04/28/2020 111* 70 - 99 mg/dL Final   Glucose reference range applies only to samples taken after fasting for at least 8 hours.  . Comment 1 04/28/2020 Notify RN   Final  . Glucose-Capillary 04/28/2020 118* 70 - 99 mg/dL Final   Glucose reference range applies only to samples taken after fasting for at least 8 hours.  . Comment 1 04/28/2020 Notify RN   Final  . WBC 04/29/2020 14.7* 4.0 - 10.5 K/uL Final  . RBC 04/29/2020 3.38* 4.22 - 5.81 MIL/uL Final  . Hemoglobin 04/29/2020 9.6* 13.0 - 17.0 g/dL Final  . HCT 04/29/2020 29.0* 39 - 52 % Final  . MCV 04/29/2020 85.8  80.0 - 100.0 fL Final  . MCH 04/29/2020 28.4  26.0 - 34.0 pg Final  . MCHC 04/29/2020 33.1  30.0 - 36.0 g/dL Final  . RDW 04/29/2020 16.9* 11.5 - 15.5 % Final  . Platelets 04/29/2020 173  150 - 400 K/uL Final  . nRBC 04/29/2020 0.0  0.0 - 0.2 % Final   Performed at [facility], [address] [place], [place] [address]  . Glucose-Capillary 04/28/2020 114* 70 - 99 mg/dL Final   Glucose reference range applies only to samples taken after fasting for at least 8 hours.  . Comment 1 04/28/2020 Notify RN   Final  . Sodium 04/29/2020 145  135 - 145 mmol/L Final  . Potassium 04/29/2020 3.6  3.5 - 5.1 mmol/L Final  . Chloride 04/29/2020 115* 98 - 111 mmol/L Final  . CO2 04/29/2020 22  22 - 32 mmol/L Final  . Glucose, Bld 04/29/2020 117* 70 - 99 mg/dL Final   Glucose reference range applies only to samples taken after fasting for at least 8 hours.  . BUN 04/29/2020 25* 8 - 23 mg/dL Final  . Creatinine, Ser 04/29/2020 1.12  0.61 - 1.24 mg/dL Final  . Calcium 04/29/2020 8.1* 8.9 - 10.3 mg/dL Final  . Total Protein 04/29/2020 5.6* 6.5 - 8.1 g/dL Final  .  Albumin 04/29/2020 3.0* 3.5 - 5.0 g/dL Final  . AST 04/29/2020 14* 15 - 41 U/L Final  . ALT 04/29/2020 10  0 - 44 U/L Final  . Alkaline Phosphatase 04/29/2020 59  38 - 126 U/L Final  . Total Bilirubin 04/29/2020 0.9  0.3 - 1.2  mg/dL Final  . GFR calc non Af Amer 04/29/2020 >60  >60 mL/min Final  . GFR calc Af Amer 04/29/2020 >60  >60 mL/min Final  . Anion gap 04/29/2020 8  5 - 15 Final   Performed at [facility], [address] [place], [place] [address]  . Glucose-Capillary 04/28/2020 106* 70 - 99 mg/dL Final   Glucose reference range applies only to samples taken after fasting for at least 8 hours.  . Comment 1 04/28/2020 Notify RN   Final  . Comment 2 04/28/2020 Document in Chart   Final  . Glucose-Capillary 04/29/2020 107* 70 - 99 mg/dL Final   Glucose reference range applies only to samples taken after fasting for at least 8 hours.  . Glucose-Capillary 04/29/2020 117* 70 - 99 mg/dL Final   Glucose reference range applies only to samples taken after fasting for at least 8 hours.  . Glucose-Capillary 04/29/2020 100* 70 - 99 mg/dL Final   Glucose reference range applies only to samples taken after fasting for at least 8 hours.  . Sodium 04/30/2020 140  135 - 145 mmol/L Final  . Potassium 04/30/2020 3.4* 3.5 - 5.1 mmol/L Final  . Chloride 04/30/2020 110  98 - 111 mmol/L Final  . CO2 04/30/2020 23  22 - 32 mmol/L Final  . Glucose, Bld 04/30/2020 113* 70 - 99 mg/dL Final   Glucose reference range applies only to samples taken after fasting for at least 8 hours.  . BUN 04/30/2020 13  8 - 23 mg/dL Final  . Creatinine, Ser 04/30/2020 0.96  0.61 - 1.24 mg/dL Final  . Calcium 04/30/2020 8.0* 8.9 - 10.3 mg/dL Final  . Total Protein 04/30/2020 5.7* 6.5 - 8.1 g/dL Final  . Albumin 04/30/2020 3.1* 3.5 - 5.0 g/dL Final  . AST 04/30/2020 13* 15 - 41 U/L Final  . ALT 04/30/2020 11  0 - 44 U/L Final  . Alkaline Phosphatase 04/30/2020 66  38 - 126 U/L Final  . Total Bilirubin 04/30/2020  1.0  0.3 - 1.2 mg/dL Final  . GFR calc non Af Amer 04/30/2020 >60  >60 mL/min Final  . GFR calc Af Amer 04/30/2020 >60  >60 mL/min Final  . Anion gap 04/30/2020 7  5 - 15 Final   Performed at [facility], [address], [place], [place] [address]  . Glucose-Capillary 04/30/2020 99  70 - 99 mg/dL Final   Glucose reference range applies only to samples taken after fasting for at least 8 hours.    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  Reviewed image records from [facility] [place] [facility] 11/26/2016 patient had a venous Doppler extremity left done which showed no occluding acute DVT at the left common femoral vein. 03/03/2017 patient had another venous Doppler extremity left which showed deep vein thromboses involving the proximal to distal femoral vein the common femoral artery now has normal compression.  04/05/2013 CT chest with contrast    Chronic pulmonary disease with areas of atelectasis and bronchiectasis. Continued significant opacification and bullous change in the left lung especially in the upper lobe and superior segment of the  left lower lobe with other patchy areas bilaterally. There may be mild interval improvement in the area of the lower lingula. Malignancy cannot be excluded in the abnormal areas of the lungs.  No central pulmonary arterial filling defect is seen. The bolus is not optimal for evaluation of the lungs  otherwise.    ASSESSMENT/PLAN 1. Iron deficiency anemia due to chronic blood loss   2. Recurrent deep vein thrombosis (DVT) (HCC)   3. Presence of IVC filter    #Acute blood loss anemia Status post PRBC transfusion during his recent hospitalization. Anticoagulation has been held. CBC on 05/07/2020 showed improvement  of hemoglobin to 11.2. Iron panel showed adequate iron level. Continue oral iron supplementation. GI bleeding continue follow-up with gastroenterology  #Recurrent left lower extremity deep vein thrombosis Currently Eliquis 5 mg twice daily was held. Patient is at high risk of recurrent DVT.  He previously developed DVT when he was in the evaluation. I recommend to resume Eliquis, decreased to 2.5 mg twice daily. Monitor blood counts.  # Presence of IVC filter, follow up with vascular surgery.   Follow up in 3 months.   Earlie Server, MD, PhD Hematology Oncology [facility] Pager- [phone]

## 2020-05-14 ENCOUNTER — Telehealth: Payer: Self-pay

## 2020-05-14 NOTE — Telephone Encounter (Signed)
Dr. Tasia Catchings states she wants patient to stop the Eliquis 3 days prior to procedure and restart it 1 day after procedure. Called and informed [facility]

## 2020-05-24 ENCOUNTER — Ambulatory Visit: Payer: Medicaid Other | Admitting: Gastroenterology

## 2020-06-13 ENCOUNTER — Other Ambulatory Visit
Admission: RE | Admit: 2020-06-13 | Discharge: 2020-06-13 | Disposition: A | Discharge status: Home / Self Care | Payer: Medicare Other | Source: Ambulatory Visit | Attending: Gastroenterology | Admitting: Gastroenterology

## 2020-06-13 ENCOUNTER — Other Ambulatory Visit: Payer: Self-pay

## 2020-06-13 DIAGNOSIS — Z01812 Encounter for preprocedural laboratory examination: Secondary | ICD-10-CM | POA: Insufficient documentation

## 2020-06-13 DIAGNOSIS — Z20822 Contact with and (suspected) exposure to covid-19: Secondary | ICD-10-CM | POA: Diagnosis not present

## 2020-06-13 LAB — SARS CORONAVIRUS 2 (TAT 6-24 HRS): SARS Coronavirus 2: NEGATIVE

## 2020-06-15 ENCOUNTER — Encounter: Payer: Self-pay | Admitting: Gastroenterology

## 2020-06-18 ENCOUNTER — Encounter (INDEPENDENT_AMBULATORY_CARE_PROVIDER_SITE_OTHER): Payer: Medicare Other

## 2020-06-18 ENCOUNTER — Ambulatory Visit (INDEPENDENT_AMBULATORY_CARE_PROVIDER_SITE_OTHER): Payer: Medicare Other | Admitting: Nurse Practitioner

## 2020-06-18 ENCOUNTER — Ambulatory Visit: Payer: Medicare Other | Admitting: Registered Nurse

## 2020-06-18 ENCOUNTER — Encounter
Admission: RE | Disposition: A | Discharge status: Home / Self Care | Payer: Self-pay | Source: Home / Self Care | Attending: Gastroenterology

## 2020-06-18 ENCOUNTER — Encounter: Payer: Self-pay | Admitting: Gastroenterology

## 2020-06-18 ENCOUNTER — Ambulatory Visit
Admission: RE | Admit: 2020-06-18 | Discharge: 2020-06-18 | Disposition: A | Discharge status: Home / Self Care | Payer: Medicare Other | Attending: Gastroenterology | Admitting: Gastroenterology

## 2020-06-18 DIAGNOSIS — K2950 Unspecified chronic gastritis without bleeding: Secondary | ICD-10-CM | POA: Diagnosis not present

## 2020-06-18 DIAGNOSIS — K209 Esophagitis, unspecified without bleeding: Secondary | ICD-10-CM

## 2020-06-18 DIAGNOSIS — K2100 Gastro-esophageal reflux disease with esophagitis, without bleeding: Secondary | ICD-10-CM | POA: Insufficient documentation

## 2020-06-18 DIAGNOSIS — Z87891 Personal history of nicotine dependence: Secondary | ICD-10-CM | POA: Insufficient documentation

## 2020-06-18 DIAGNOSIS — K449 Diaphragmatic hernia without obstruction or gangrene: Secondary | ICD-10-CM | POA: Insufficient documentation

## 2020-06-18 DIAGNOSIS — Z89612 Acquired absence of left leg above knee: Secondary | ICD-10-CM | POA: Diagnosis not present

## 2020-06-18 HISTORY — PX: ESOPHAGOGASTRODUODENOSCOPY (EGD) WITH PROPOFOL: SHX5813

## 2020-06-18 SURGERY — ESOPHAGOGASTRODUODENOSCOPY (EGD) WITH PROPOFOL
Anesthesia: General

## 2020-06-18 MED ORDER — SODIUM CHLORIDE 0.9 % IV SOLN
INTRAVENOUS | Status: DC
  Administered 2020-06-18: 1000 mL via INTRAVENOUS

## 2020-06-18 MED ORDER — PROPOFOL 10 MG/ML IV BOLUS
INTRAVENOUS | Status: DC | PRN
  Administered 2020-06-18: 70 mg via INTRAVENOUS

## 2020-06-18 MED ORDER — PROPOFOL 500 MG/50ML IV EMUL
INTRAVENOUS | Status: DC | PRN
  Administered 2020-06-18: 140 ug/kg/min via INTRAVENOUS

## 2020-06-18 MED ORDER — LIDOCAINE HCL (CARDIAC) PF 100 MG/5ML IV SOSY
PREFILLED_SYRINGE | INTRAVENOUS | Status: DC | PRN
  Administered 2020-06-18: 100 mg via INTRAVENOUS

## 2020-06-18 NOTE — H&P (Signed)
Cephas Darby, MD [address]  [place]  [place], [place] [address]  Main: [phone]  Fax: [phone] Pager: [phone]  Primary Care Physician:  Gennie Alma, MD Primary Gastroenterologist:  Dr. Cephas Darby  Pre-Procedure History & Physical: HPI:  Timothy Juarez is a 69 y.o. male is here for an endoscopy.   Past Medical History:  Diagnosis Date  . Acute embolism and thombos unsp deep vn unsp lower extremity ([place])   . Acute respiratory failure ([place])   . AKI (acute kidney injury) ([place]) 12/27/2018  . Allergy   . Anemia   . Aneurysm of unspecified site ([place])   . ARF (acute respiratory failure) ([place])    H/O  . Bronchitis   . Cellulitis and abscess of leg 09/27/2019  . CHF (congestive heart failure) ([place])   . COPD (chronic obstructive pulmonary disease) ([place])   . Cough   . Epistaxis   . Gastrointestinal hemorrhage   . GERD (gastroesophageal reflux disease)   . Gout   . Hematemesis 04/27/2020  . High anion gap metabolic acidosis 7/68/1157  . Hyperlipidemia   . Hypertension   . Hypokalemia   . Infection of above knee amputation stump ([place]) 09/27/2019  . Insomnia   . Iron deficiency anemia 04/21/2017  . Muscle contracture    MUSCLE SPASMS, muscle weakness  . Peripheral vascular disease ([place])   . Pneumonia   . Pressure ulcer   . Severe sepsis ([place]) 12/27/2018  . Toxic encephalopathy 12/27/2018    Past Surgical History:  Procedure Laterality Date  . AMPUTATION Left 09/19/2015   Procedure: AMPUTATION BELOW KNEE;  Surgeon: Algernon Huxley, MD;  Location: [facility] ORS;  Service: Vascular;  Laterality: Left;  . AMPUTATION Left 11/01/2015   Procedure: AMPUTATION ABOVE KNEE;  Surgeon: Algernon Huxley, MD;  Location: [facility] ORS;  Service: Vascular;  Laterality: Left;  . AMPUTATION Left 09/29/2019   Procedure: IRRIGATION AND DEBRIDEMENT OF LEFT AKA;  Surgeon: Algernon Huxley, MD;  Location: [facility];  Service: General;  Laterality: Left;  . APPLICATION OF WOUND VAC Left 10/18/2015    Procedure: APPLICATION OF WOUND VAC;  Surgeon: Algernon Huxley, MD;  Location: [facility] ORS;  Service: Vascular;  Laterality: Left;  . APPLICATION OF WOUND VAC Left 09/30/2019   Procedure: APPLICATION OF WOUND VAC;  Surgeon: Katha Cabal, MD;  Location: [facility];  Service: Vascular;  Laterality: Left;  serial # [id number]  . CARDIAC CATHETERIZATION    . CATARACT EXTRACTION W/PHACO Right 08/18/2019   Procedure: CATARACT EXTRACTION PHACO AND INTRAOCULAR LENS PLACEMENT ([place]) RIGHT;  Surgeon: Birder Robson, MD;  Location: [facility];  Service: Ophthalmology;  Laterality: Right;  [place] 03:29.0 CDE 45.68 Fluid Pack Lot # [id number]  . CENTRAL VENOUS CATHETER INSERTION Right 09/30/2019   Procedure: INSERTION CENTRAL LINE ADULT;  Surgeon: Katha Cabal, MD;  Location: [facility];  Service: Vascular;  Laterality: Right;  . COLONOSCOPY WITH PROPOFOL N/A 05/25/2017   Procedure: COLONOSCOPY WITH PROPOFOL;  Surgeon: Jonathon Bellows, MD;  Location: [facility] ENDOSCOPY;  Service: Gastroenterology;  Laterality: N/A;  . COLONOSCOPY WITH PROPOFOL N/A 10/14/2017   Procedure: COLONOSCOPY WITH PROPOFOL;  Surgeon: Jonathon Bellows, MD;  Location: [facility] ENDOSCOPY;  Service: Gastroenterology;  Laterality: N/A;  . ESOPHAGOGASTRODUODENOSCOPY (EGD) WITH PROPOFOL N/A 05/25/2017   Procedure: ESOPHAGOGASTRODUODENOSCOPY (EGD) WITH PROPOFOL;  Surgeon: Jonathon Bellows, MD;  Location: [facility] ENDOSCOPY;  Service: Gastroenterology;  Laterality: N/A;  . ESOPHAGOGASTRODUODENOSCOPY (EGD) WITH PROPOFOL N/A 10/14/2017   Procedure: ESOPHAGOGASTRODUODENOSCOPY (EGD) WITH PROPOFOL;  Surgeon: Jonathon Bellows, MD;  Location: [facility] ENDOSCOPY;  Service: Gastroenterology;  Laterality: N/A;  . ESOPHAGOGASTRODUODENOSCOPY (EGD) WITH PROPOFOL N/A 04/27/2020   Procedure: ESOPHAGOGASTRODUODENOSCOPY (EGD) WITH PROPOFOL;  Surgeon: Lin Landsman, MD;  Location: [facility] ENDOSCOPY;  Service: Gastroenterology;  Laterality: N/A;  . EYE SURGERY    . GIVENS CAPSULE STUDY N/A 07/14/2017    Procedure: GIVENS CAPSULE STUDY;  Surgeon: Jonathon Bellows, MD;  Location: [facility] ENDOSCOPY;  Service: Gastroenterology;  Laterality: N/A;  . GIVENS CAPSULE STUDY N/A 10/30/2017   Procedure: GIVENS CAPSULE STUDY 12 HR;  Surgeon: Jonathon Bellows, MD;  Location: [facility] ENDOSCOPY;  Service: Gastroenterology;  Laterality: N/A;  . IVC FILTER INSERTION N/A 10/12/2017   Procedure: IVC FILTER INSERTION;  Surgeon: Algernon Huxley, MD;  Location: [place] CV LAB;  Service: Cardiovascular;  Laterality: N/A;  . LOWER EXTREMITY ANGIOGRAPHY Right 10/04/2018   Procedure: LOWER EXTREMITY ANGIOGRAPHY;  Surgeon: Algernon Huxley, MD;  Location: [place] CV LAB;  Service: Cardiovascular;  Laterality: Right;  . PERIPHERAL VASCULAR CATHETERIZATION Left 01/18/2015   Procedure: Lower Extremity Angiography;  Surgeon: Algernon Huxley, MD;  Location: [place] CV LAB;  Service: Cardiovascular;  Laterality: Left;  . PERIPHERAL VASCULAR CATHETERIZATION N/A 01/18/2015   Procedure: Lower Extremity Intervention;  Surgeon: Algernon Huxley, MD;  Location: [place] CV LAB;  Service: Cardiovascular;  Laterality: N/A;  . PERIPHERAL VASCULAR CATHETERIZATION  07/30/2015   Procedure: Lower Extremity Intervention;  Surgeon: Algernon Huxley, MD;  Location: [place] CV LAB;  Service: Cardiovascular;;  . PERIPHERAL VASCULAR CATHETERIZATION N/A 07/30/2015   Procedure: Abdominal Aortogram w/Lower Extremity;  Surgeon: Algernon Huxley, MD;  Location: [place] CV LAB;  Service: Cardiovascular;  Laterality: N/A;  . PERIPHERAL VASCULAR CATHETERIZATION Left 08/22/2015   Procedure: Lower Extremity Angiography;  Surgeon: Algernon Huxley, MD;  Location: [place] CV LAB;  Service: Cardiovascular;  Laterality: Left;  . PERIPHERAL VASCULAR CATHETERIZATION Left 08/22/2015   Procedure: Lower Extremity Intervention;  Surgeon: Algernon Huxley, MD;  Location: [place] CV LAB;  Service: Cardiovascular;  Laterality: Left;  . PERIPHERAL VASCULAR CATHETERIZATION Right  03/31/2016   Procedure: Lower Extremity Angiography;  Surgeon: Algernon Huxley, MD;  Location: [place] CV LAB;  Service: Cardiovascular;  Laterality: Right;  . PERIPHERAL VASCULAR CATHETERIZATION  03/31/2016   Procedure: Lower Extremity Intervention;  Surgeon: Algernon Huxley, MD;  Location: [place] CV LAB;  Service: Cardiovascular;;  . PERIPHERAL VASCULAR CATHETERIZATION Left 04/10/2016   Procedure: Lower Extremity Angiography;  Surgeon: Algernon Huxley, MD;  Location: [place] CV LAB;  Service: Cardiovascular;  Laterality: Left;  [name] VACUUM ASSISTED CLOSURE CHANGE Left 10/03/2019   Procedure: LEFT THIGH VACUUM ASSISTED CLOSURE CHANGE;  Surgeon: Algernon Huxley, MD;  Location: [facility] ORS;  Service: General;  Laterality: Left;  . WOUND DEBRIDEMENT Left 10/18/2015   Procedure: DEBRIDEMENT WOUND   ( LEFT BKA DEBRIDEMENT );  Surgeon: Algernon Huxley, MD;  Location: [facility] ORS;  Service: Vascular;  Laterality: Left;    Prior to Admission medications   Medication Sig Start Date End Date Taking? Authorizing Provider  acetaminophen (TYLENOL) 325 MG tablet Take 650 mg by mouth every 4 (four) hours as needed for fever.     [provider]  acidophilus (RISAQUAD) CAPS capsule Take 1 capsule by mouth 2 (two) times daily.    [provider]  ammonium lactate (AMLACTIN) 12 % cream Apply topically 2 (two) times daily. Apply to right foot Patient not taking: Reported on  04/27/2020    [provider]  apixaban (ELIQUIS) 2.5 MG TABS tablet Take 1 tablet (2.5 mg total) by mouth 2 (two) times daily. 05/10/20   Earlie Server, MD  ascorbic acid (VITAMIN C) 500 MG tablet Take 500 mg by mouth 2 (two) times daily.    [provider]  atorvastatin (LIPITOR) 10 MG tablet Take 1 tablet (10 mg total) by mouth daily. Patient taking differently: Take 10 mg by mouth at bedtime.  10/04/18 05/10/20  Algernon Huxley, MD  baclofen (LIORESAL) 10 MG tablet Take 10 mg by mouth 3 (three) times daily. Hold for sedation     [provider]  brimonidine-timolol (COMBIGAN) 0.2-0.5 % ophthalmic solution Place 1 drop into the right eye 2 (two) times daily. Patient not taking: Reported on 04/27/2020    [provider]  ciclopirox (PENLAC) 8 % solution Apply 1 application topically daily. Apply solutions to toenails Patient not taking: Reported on 05/07/2020    [provider]  Difluprednate (DUREZOL) 0.05 % EMUL Place 1 drop into the right eye 3 (three) times daily.  Patient not taking: Reported on 04/27/2020    [provider]  Difluprednate (DUREZOL) 0.05 % EMUL Apply 1 drop to eye daily. Patient not taking: Reported on 04/27/2020    [provider]  docusate sodium (COLACE) 100 MG capsule Take 100 mg by mouth daily as needed for mild constipation.    [provider]  fentaNYL (DURAGESIC) 25 MCG/HR Place 1 patch onto the skin every 3 (three) days.  10/13/19   [provider]  ferrous sulfate 324 (65 Fe) MG TBEC Take 324 mg by mouth 2 (two) times daily.     [provider]  furosemide (LASIX) 40 MG tablet Take 1 tablet (40 mg total) by mouth daily. 10/15/17   Loletha Grayer, MD  gabapentin (NEURONTIN) 100 MG capsule Take 100 mg by mouth at bedtime.     [provider]  guaiFENesin (ROBITUSSIN) 100 MG/5ML SOLN Take 15 mLs by mouth 3 (three) times daily as needed for cough (congestion).     [provider]  ipratropium-albuterol (DUONEB) 0.5-2.5 (3) MG/3ML SOLN Take by nebulization 3 (three) times daily as needed. Patient not taking: Reported on 05/10/2020 03/06/20   [provider]  loperamide (IMODIUM) 2 MG capsule Take 2 mg by mouth 3 (three) times daily as needed for diarrhea or loose stools.    [provider]  Melatonin 5 MG TABS Take 5 mg by mouth at bedtime as needed (insomnia).     [provider]  Morphine Sulfate (MORPHINE CONCENTRATE) 10 mg / 0.5 ml concentrated solution  10/13/19   [provider]  nitroGLYCERIN (NITROSTAT) 0.4 MG SL tablet Place 0.14 mg under the tongue every 5 (five) minutes as needed for chest pain.    [provider]  Omega-3 Fatty Acids (FISH OIL) 1000 MG CAPS Take 1,000 mg by mouth daily.     [provider]  Oxycodone HCl 10 MG TABS Take 10 mg by mouth every 4 (four) hours as needed (pain).     [provider]  oxymetazoline (AFRIN) 0.05 % nasal spray Place 2 sprays into both nostrils 2 (two) times daily as needed (For nose bleeds).     [provider]  pantoprazole (PROTONIX) 40 MG tablet Take 1 tablet (40 mg total) by mouth 2 (two) times daily. 04/30/20 05/30/20  Wyvonnia Dusky, MD  potassium chloride SA (K-DUR,KLOR-CON) 20 MEQ tablet Take 20 mEq  by mouth daily. With or after meal 10/15/17   [provider]  promethazine (PHENERGAN) 25 MG/ML injection  04/27/20   [provider]  sodium chloride (OCEAN) 0.65 % SOLN nasal spray Place 2 sprays into both nostrils 2 (two) times daily as needed for congestion.    [provider]  tamsulosin (FLOMAX) 0.4 MG CAPS capsule Take 1 capsule (0.4 mg total) by mouth daily. 10/06/19   Max Sane, MD  TRELEGY ELLIPTA 100-62.5-25 MCG/INH AEPB Inhale 1 puff into the lungs every morning.  09/24/18   [provider]  triamcinolone cream (KENALOG) 0.1 % Apply 1 application topically 2 (two) times daily. Patient not taking: Reported on 04/27/2020    [provider]    Allergies as of 05/07/2020  . (No Known Allergies)    Family History  Problem Relation Age of Onset  . Heart attack Mother   . Varicose Veins Neg Hx     Social History   Socioeconomic History  . Marital status: Legally Separated    Spouse name: Not on file  . Number of children: Not on file  . Years of education: Not on file  . Highest education level: Not on file  Occupational History  . Not on file  Tobacco Use  . Smoking status: Former Smoker    Packs/day:  1.00    Years: 44.00    Pack years: 44.00    Types: Cigarettes    Quit date: 11/17/2012    Years since quitting: 7.5  . Smokeless tobacco: Never Used  Vaping Use  . Vaping Use: Never used  Substance and Sexual Activity  . Alcohol use: Not Currently  . Drug use: Never  . Sexual activity: Never  Other Topics Concern  . Not on file  Social History Narrative  . Not on file   Social Determinants of Health   Financial Resource Strain:   . Difficulty of Paying Living Expenses: Not on file  Food Insecurity:   . Worried About Charity fundraiser in the Last Year: Not on file  . Ran Out of Food in the Last Year: Not on file  Transportation Needs:   . Lack of Transportation (Medical): Not on file  . Lack of Transportation (Non-Medical): Not on file  Physical Activity:   . Days of Exercise per Week: Not on file  . Minutes of Exercise per Session: Not on file  Stress:   . Feeling of Stress : Not on file  Social Connections:   . Frequency of Communication with Friends and Family: Not on file  . Frequency of Social Gatherings with Friends and Family: Not on file  . Attends Religious Services: Not on file  . Active Member of Clubs or Organizations: Not on file  . Attends Archivist Meetings: Not on file  . Marital Status: Not on file  Intimate Partner Violence:   . Fear of Current or Ex-Partner: Not on file  . Emotionally Abused: Not on file  . Physically Abused: Not on file  . Sexually Abused: Not on file    Review of Systems: See HPI, otherwise negative ROS  Physical Exam: BP 136/67   Pulse 70   Temp 97.7 F (36.5 C) (Temporal)   Resp 18   Ht 6\' 1"  (1.854 m)   Wt 98.2 kg   SpO2 97%   BMI 28.56 kg/m  General:   Alert,  pleasant and cooperative in NAD Head:  Normocephalic and atraumatic. Neck:  Supple; no masses  or thyromegaly. Lungs:  Clear throughout to auscultation.    Heart:  Regular rate and rhythm. Abdomen:  Soft, nontender and nondistended. Normal  bowel sounds, without guarding, and without rebound.   Neurologic:  Alert and  oriented x4;  grossly normal neurologically.  Impression/Plan: Mikel Cella is here for an endoscopy to be performed for erosive esophagitis  Risks, benefits, limitations, and alternatives regarding  endoscopy have been reviewed with the patient.  Questions have been answered.  All parties agreeable.   Sherri Sear, MD  06/18/2020, 8:56 AM

## 2020-06-18 NOTE — Transfer of Care (Signed)
Immediate Anesthesia Transfer of Care Note  Patient: BRYTON ROMAGNOLI  Procedure(s) Performed: ESOPHAGOGASTRODUODENOSCOPY (EGD) WITH PROPOFOL (N/A )  Patient Location: PACU  Anesthesia Type:General  Level of Consciousness: sedated  Airway & Oxygen Therapy: Patient Spontanous Breathing and Patient connected to nasal cannula oxygen  Post-op Assessment: Report given to RN and Post -op Vital signs reviewed and stable  Post vital signs: Reviewed and stable  Last Vitals:  Vitals Value Taken Time  BP 143/76 06/18/20 0934  Temp 37 C 06/18/20 0924  Pulse 63 06/18/20 0942  Resp 11 06/18/20 0942  SpO2 95 % 06/18/20 0942  Vitals shown include unvalidated device data.  Last Pain:  Vitals:   06/18/20 0934  TempSrc:   PainSc: 0-No pain         Complications: No complications documented.

## 2020-06-18 NOTE — Op Note (Signed)
[facility] Gastroenterology Patient Name: Timothy Juarez Procedure Date: 06/18/2020 8:53 AM MRN: [id number] Account #: [id number] Date of Birth: January 23, 1951 Admit Type: Outpatient Age: 69 Room: [facility] ENDO ROOM 1 Gender: Male Note Status: Finalized Procedure:             Upper GI endoscopy Indications:           Follow-up of reflux esophagitis Providers:             Lin Landsman MD, MD Medicines:             General Anesthesia Complications:         No immediate complications. Estimated blood loss: None. Procedure:             Pre-Anesthesia Assessment:                        - Prior to the procedure, a History and Physical was                         performed, and patient medications and allergies were                         reviewed. The patient is competent. The risks and                         benefits of the procedure and the sedation options and                         risks were discussed with the patient. All questions                         were answered and informed consent was obtained.                         Patient identification and proposed procedure were                         verified by the physician, the nurse, the                         anesthesiologist, the anesthetist and the technician                         in the pre-procedure area in the procedure room in the                         endoscopy suite. Mental Status Examination: alert and                         oriented. Airway Examination: normal oropharyngeal                         airway and neck mobility. Respiratory Examination:                         clear to auscultation. CV Examination: normal.                         Prophylactic Antibiotics: The patient does not  require                         prophylactic antibiotics. Prior Anticoagulants: The                         patient has taken Eliquis (apixaban), last dose was 3                         days prior to  procedure. ASA Grade Assessment: III - A                         patient with severe systemic disease. After reviewing                         the risks and benefits, the patient was deemed in                         satisfactory condition to undergo the procedure. The                         anesthesia plan was to use general anesthesia.                         Immediately prior to administration of medications,                         the patient was re-assessed for adequacy to receive                         sedatives. The heart rate, respiratory rate, oxygen                         saturations, blood pressure, adequacy of pulmonary                         ventilation, and response to care were monitored                         throughout the procedure. The physical status of the                         patient was re-assessed after the procedure.                        After obtaining informed consent, the endoscope was                         passed under direct vision. Throughout the procedure,                         the patient's blood pressure, pulse, and oxygen                         saturations were monitored continuously. The Endoscope                         was introduced through the mouth, and advanced to the  second part of duodenum. The upper GI endoscopy was                         accomplished without difficulty. The patient tolerated                         the procedure well. Findings:      The duodenal bulb and second portion of the duodenum were normal.      Multiple dispersed diminutive erosions with no bleeding and no stigmata       of recent bleeding were found in the gastric antrum. Biopsies were taken       with a cold forceps for Helicobacter pylori testing.      Diffuse mildly erythematous mucosa without bleeding was found in the       gastric body. Biopsies were taken with a cold forceps for Helicobacter       pylori testing.      A  small hiatal hernia was present.      Esophagogastric landmarks were identified: the gastroesophageal junction       was found at 36 cm from the incisors.      LA Grade A (one or more mucosal breaks less than 5 mm, not extending       between tops of 2 mucosal folds) esophagitis with no bleeding was found       in the distal esophagus. No evidence of ulcer, significantly healed       compared to last EGD Impression:            - Normal duodenal bulb and second portion of the                         duodenum.                        - Erosive gastropathy with no bleeding and no stigmata                         of recent bleeding. Biopsied.                        - Erythematous mucosa in the gastric body. Biopsied.                        - Small hiatal hernia.                        - Esophagogastric landmarks identified.                        - LA Grade A reflux esophagitis with no bleeding. Recommendation:        - Discharge patient to a nursing home (with escort).                        - Resume previous diet today.                        - Continue present medications.                        - Resume Eliquis (apixaban) at prior dose today. Refer  to managing physician for further adjustment of                         therapy.                        - Await pathology results.                        - Return to my office in 3 months. Procedure Code(s):     --- Professional ---                        [phone], Esophagogastroduodenoscopy, flexible,                         transoral; with biopsy, single or multiple Diagnosis Code(s):     --- Professional ---                        K31.89, Other diseases of stomach and duodenum                        K44.9, Diaphragmatic hernia without obstruction or                         gangrene                        K21.00 CPT copyright 2019 American Medical Association. All rights reserved. The codes documented in this report  are preliminary and upon coder review may  be revised to meet current compliance requirements. Dr. Ulyess Mort Lin Landsman MD, MD 06/18/2020 9:23:47 AM This report has been signed electronically. Number of Addenda: 0 Note Initiated On: 06/18/2020 8:53 AM Estimated Blood Loss:  Estimated blood loss: none.      [facility]

## 2020-06-18 NOTE — Anesthesia Preprocedure Evaluation (Signed)
Anesthesia Evaluation  Patient identified by MRN, date of birth, ID band Patient awake    Reviewed: Allergy & Precautions, NPO status , Patient's Chart, lab work & pertinent test results  History of Anesthesia Complications Negative for: history of anesthetic complications  Airway Mallampati: II  TM Distance: >3 FB Neck ROM: Full    Dental  (+) Poor Dentition, Missing, Dental Advidsory Given   Pulmonary neg shortness of breath, neg sleep apnea, pneumonia, resolved, COPD,  COPD inhaler, neg recent URI, former smoker,    breath sounds clear to auscultation- rhonchi (-) wheezing      Cardiovascular hypertension, Pt. on medications (-) angina+ Peripheral Vascular Disease and +CHF  (-) CAD, (-) Past MI, (-) Cardiac Stents and (-) CABG  Rhythm:Regular Rate:Normal - Systolic murmurs and - Diastolic murmurs    Neuro/Psych neg Seizures negative neurological ROS  negative psych ROS   GI/Hepatic Neg liver ROS, GERD  ,Esophageal foreign body   Endo/Other  negative endocrine ROSneg diabetes  Renal/GU negative Renal ROS     Musculoskeletal negative musculoskeletal ROS (+)   Abdominal (+) - obese,   Peds  Hematology  (+) Blood dyscrasia, anemia ,   Anesthesia Other Findings Past Medical History: No date: Acute embolism and thombos unsp deep vn unsp lower extremity  ([facility]) No date: Acute respiratory failure (HCC) No date: Allergy No date: Anemia No date: Aneurysm of unspecified site [facility]) No date: ARF (acute respiratory failure) (HCC)     Comment:  H/O No date: Bronchitis No date: CHF (congestive heart failure) (HCC) No date: COPD (chronic obstructive pulmonary disease) (HCC) No date: Cough No date: Epistaxis No date: GERD (gastroesophageal reflux disease) No date: Gout No date: Hyperlipidemia No date: Hypertension No date: Hypokalemia No date: Insomnia No date: Muscle contracture     Comment:  MUSCLE SPASMS, muscle  weakness No date: Peripheral vascular disease (HCC) No date: Pneumonia No date: Pressure ulcer   Reproductive/Obstetrics                             Anesthesia Physical  Anesthesia Plan  ASA: III  Anesthesia Plan: General   Post-op Pain Management:    Induction: Intravenous  PONV Risk Score and Plan: 1 and Propofol infusion and TIVA  Airway Management Planned: Natural Airway and Nasal Cannula  Additional Equipment:   Intra-op Plan:   Post-operative Plan: Extubation in OR  Informed Consent: I have reviewed the patients History and Physical, chart, labs and discussed the procedure including the risks, benefits and alternatives for the proposed anesthesia with the patient or authorized representative who has indicated his/her understanding and acceptance.     Dental advisory given  Plan Discussed with: CRNA and Anesthesiologist  Anesthesia Plan Comments:         Anesthesia Quick Evaluation

## 2020-06-18 NOTE — Anesthesia Postprocedure Evaluation (Signed)
Anesthesia Post Note  Patient: Timothy Juarez  Procedure(s) Performed: ESOPHAGOGASTRODUODENOSCOPY (EGD) WITH PROPOFOL (N/A )  Patient location during evaluation: Endoscopy Anesthesia Type: General Level of consciousness: awake and alert Pain management: pain level controlled Vital Signs Assessment: post-procedure vital signs reviewed and stable Respiratory status: spontaneous breathing, nonlabored ventilation, respiratory function stable and patient connected to nasal cannula oxygen Cardiovascular status: blood pressure returned to baseline and stable Postop Assessment: no apparent nausea or vomiting Anesthetic complications: no   No complications documented.   Last Vitals:  Vitals:   06/18/20 0944 06/18/20 0954  BP: (!) 145/75 (!) 144/74  Pulse: 64 65  Resp: 16 17  Temp:    SpO2: 95% 97%    Last Pain:  Vitals:   06/18/20 0954  TempSrc:   PainSc: 0-No pain                 Martha Clan

## 2020-06-19 ENCOUNTER — Encounter: Payer: Self-pay | Admitting: Gastroenterology

## 2020-06-19 LAB — SURGICAL PATHOLOGY

## 2020-06-20 ENCOUNTER — Encounter (INDEPENDENT_AMBULATORY_CARE_PROVIDER_SITE_OTHER): Payer: Self-pay | Admitting: Nurse Practitioner

## 2020-06-20 ENCOUNTER — Ambulatory Visit (INDEPENDENT_AMBULATORY_CARE_PROVIDER_SITE_OTHER): Payer: Medicare Other | Admitting: Nurse Practitioner

## 2020-06-20 ENCOUNTER — Ambulatory Visit (INDEPENDENT_AMBULATORY_CARE_PROVIDER_SITE_OTHER): Payer: Medicare Other

## 2020-06-20 ENCOUNTER — Other Ambulatory Visit: Payer: Self-pay

## 2020-06-20 VITALS — BP 135/78 | HR 71 | Ht 73.0 in | Wt 216.0 lb

## 2020-06-20 DIAGNOSIS — I10 Essential (primary) hypertension: Secondary | ICD-10-CM

## 2020-06-20 DIAGNOSIS — E785 Hyperlipidemia, unspecified: Secondary | ICD-10-CM

## 2020-06-20 DIAGNOSIS — I739 Peripheral vascular disease, unspecified: Secondary | ICD-10-CM

## 2020-06-20 NOTE — Progress Notes (Signed)
Subjective:    Patient ID: Timothy Juarez, male    DOB: 07-17-1951, 69 y.o.   MRN: [id number] Chief Complaint  Patient presents with  . Follow-up    42mo ABI    The patient returns to the office for followup and review of the noninvasive studies. There have been no interval changes in lower extremity symptoms. No interval shortening of the patient's claudication distance or development of rest pain symptoms. No new ulcers or wounds have occurred since the last visit.  There have been no significant changes to the patient's overall health care.  The patient denies amaurosis fugax or recent TIA symptoms. There are no recent neurological changes noted. The patient denies history of DVT, PE or superficial thrombophlebitis. The patient denies recent episodes of angina or shortness of breath.   ABI Rt=0.58 and Lt=n/a  (previous ABI's Rt=0.70 and n/a) Duplex ultrasound of the right tibial arteries reveals strong monophasic waveforms.   Review of Systems  Skin: Negative for wound.  Neurological: Positive for weakness.  All other systems reviewed and are negative.      Objective:   Physical Exam Vitals reviewed.  HENT:     Head: Normocephalic.  Cardiovascular:     Rate and Rhythm: Normal rate.     Pulses: Normal pulses.  Musculoskeletal:     Left Lower Extremity: Left leg is amputated above knee.  Skin:    General: Skin is warm and dry.  Neurological:     Mental Status: He is alert and oriented to person, place, and time.     Motor: Weakness present.     Gait: Gait abnormal.  Psychiatric:        Mood and Affect: Mood normal.        Behavior: Behavior normal.        Thought Content: Thought content normal.        Judgment: Judgment normal.     BP 135/78   Pulse 71   Ht 6\' 1"  (1.854 m)   Wt 216 lb (98 kg)   BMI 28.50 kg/m   Past Medical History:  Diagnosis Date  . Acute embolism and thombos unsp deep vn unsp lower extremity ([place])   . Acute respiratory failure  ([place])   . AKI (acute kidney injury) ([place]) 12/27/2018  . Allergy   . Anemia   . Aneurysm of unspecified site ([place])   . ARF (acute respiratory failure) ([place])    H/O  . Bronchitis   . Cellulitis and abscess of leg 09/27/2019  . CHF (congestive heart failure) ([place])   . COPD (chronic obstructive pulmonary disease) ([place])   . Cough   . Epistaxis   . Gastrointestinal hemorrhage   . GERD (gastroesophageal reflux disease)   . Gout   . Hematemesis 04/27/2020  . High anion gap metabolic acidosis 02/15/1600  . Hyperlipidemia   . Hypertension   . Hypokalemia   . Infection of above knee amputation stump ([place]) 09/27/2019  . Insomnia   . Iron deficiency anemia 04/21/2017  . Muscle contracture    MUSCLE SPASMS, muscle weakness  . Peripheral vascular disease ([place])   . Pneumonia   . Pressure ulcer   . Severe sepsis ([place]) 12/27/2018  . Toxic encephalopathy 12/27/2018    Social History   Socioeconomic History  . Marital status: Legally Separated    Spouse name: Not on file  . Number of children: Not on file  . Years of education: Not on file  . Highest education level:  Not on file  Occupational History  . Not on file  Tobacco Use  . Smoking status: Former Smoker    Packs/day: 1.00    Years: 44.00    Pack years: 44.00    Types: Cigarettes    Quit date: 11/17/2012    Years since quitting: 7.5  . Smokeless tobacco: Never Used  Vaping Use  . Vaping Use: Never used  Substance and Sexual Activity  . Alcohol use: Not Currently  . Drug use: Never  . Sexual activity: Never  Other Topics Concern  . Not on file  Social History Narrative  . Not on file   Social Determinants of Health   Financial Resource Strain:   . Difficulty of Paying Living Expenses: Not on file  Food Insecurity:   . Worried About Charity fundraiser in the Last Year: Not on file  . Ran Out of Food in the Last Year: Not on file  Transportation Needs:   . Lack of Transportation (Medical): Not on file  . Lack of  Transportation (Non-Medical): Not on file  Physical Activity:   . Days of Exercise per Week: Not on file  . Minutes of Exercise per Session: Not on file  Stress:   . Feeling of Stress : Not on file  Social Connections:   . Frequency of Communication with Friends and Family: Not on file  . Frequency of Social Gatherings with Friends and Family: Not on file  . Attends Religious Services: Not on file  . Active Member of Clubs or Organizations: Not on file  . Attends Archivist Meetings: Not on file  . Marital Status: Not on file  Intimate Partner Violence:   . Fear of Current or Ex-Partner: Not on file  . Emotionally Abused: Not on file  . Physically Abused: Not on file  . Sexually Abused: Not on file    Past Surgical History:  Procedure Laterality Date  . AMPUTATION Left 09/19/2015   Procedure: AMPUTATION BELOW KNEE;  Surgeon: Algernon Huxley, MD;  Location: [facility]S;  Service: Vascular;  Laterality: Left;  . AMPUTATION Left 11/01/2015   Procedure: AMPUTATION ABOVE KNEE;  Surgeon: Algernon Huxley, MD;  Location: [facility];  Service: Vascular;  Laterality: Left;  . AMPUTATION Left 09/29/2019   Procedure: IRRIGATION AND DEBRIDEMENT OF LEFT AKA;  Surgeon: Algernon Huxley, MD;  Location: [facility];  Service: General;  Laterality: Left;  . APPLICATION OF WOUND VAC Left 10/18/2015   Procedure: APPLICATION OF WOUND VAC;  Surgeon: Algernon Huxley, MD;  Location: [facility];  Service: Vascular;  Laterality: Left;  . APPLICATION OF WOUND VAC Left 09/30/2019   Procedure: APPLICATION OF WOUND VAC;  Surgeon: Katha Cabal, MD;  Location: [facility];  Service: Vascular;  Laterality: Left;  serial # [id number]  . CARDIAC CATHETERIZATION    . CATARACT EXTRACTION W/PHACO Right 08/18/2019   Procedure: CATARACT EXTRACTION PHACO AND INTRAOCULAR LENS PLACEMENT ([place]) RIGHT;  Surgeon: Birder Robson, MD;  Location: [facility];  Service: Ophthalmology;  Laterality: Right;  [place] 03:29.0 CDE 45.68 Fluid Pack Lot # [id number]   . CENTRAL VENOUS CATHETER INSERTION Right 09/30/2019   Procedure: INSERTION CENTRAL LINE ADULT;  Surgeon: Katha Cabal, MD;  Location: [facility];  Service: Vascular;  Laterality: Right;  . COLONOSCOPY WITH PROPOFOL N/A 05/25/2017   Procedure: COLONOSCOPY WITH PROPOFOL;  Surgeon: Jonathon Bellows, MD;  Location: [facility] ENDOSCOPY;  Service: Gastroenterology;  Laterality: N/A;  . COLONOSCOPY WITH PROPOFOL N/A 10/14/2017  Procedure: COLONOSCOPY WITH PROPOFOL;  Surgeon: Jonathon Bellows, MD;  Location: [facility] ENDOSCOPY;  Service: Gastroenterology;  Laterality: N/A;  . ESOPHAGOGASTRODUODENOSCOPY (EGD) WITH PROPOFOL N/A 05/25/2017   Procedure: ESOPHAGOGASTRODUODENOSCOPY (EGD) WITH PROPOFOL;  Surgeon: Jonathon Bellows, MD;  Location: [facility] ENDOSCOPY;  Service: Gastroenterology;  Laterality: N/A;  . ESOPHAGOGASTRODUODENOSCOPY (EGD) WITH PROPOFOL N/A 10/14/2017   Procedure: ESOPHAGOGASTRODUODENOSCOPY (EGD) WITH PROPOFOL;  Surgeon: Jonathon Bellows, MD;  Location: [facility] ENDOSCOPY;  Service: Gastroenterology;  Laterality: N/A;  . ESOPHAGOGASTRODUODENOSCOPY (EGD) WITH PROPOFOL N/A 04/27/2020   Procedure: ESOPHAGOGASTRODUODENOSCOPY (EGD) WITH PROPOFOL;  Surgeon: Lin Landsman, MD;  Location: [facility] ENDOSCOPY;  Service: Gastroenterology;  Laterality: N/A;  . ESOPHAGOGASTRODUODENOSCOPY (EGD) WITH PROPOFOL N/A 06/18/2020   Procedure: ESOPHAGOGASTRODUODENOSCOPY (EGD) WITH PROPOFOL;  Surgeon: Lin Landsman, MD;  Location: [facility] ENDOSCOPY;  Service: Gastroenterology;  Laterality: N/A;  . EYE SURGERY    . GIVENS CAPSULE STUDY N/A 07/14/2017   Procedure: GIVENS CAPSULE STUDY;  Surgeon: Jonathon Bellows, MD;  Location: [facility] ENDOSCOPY;  Service: Gastroenterology;  Laterality: N/A;  . GIVENS CAPSULE STUDY N/A 10/30/2017   Procedure: GIVENS CAPSULE STUDY 12 HR;  Surgeon: Jonathon Bellows, MD;  Location: [facility] ENDOSCOPY;  Service: Gastroenterology;  Laterality: N/A;  . IVC FILTER INSERTION N/A 10/12/2017   Procedure: IVC FILTER INSERTION;  Surgeon:  Algernon Huxley, MD;  Location: [place] CV LAB;  Service: Cardiovascular;  Laterality: N/A;  . LOWER EXTREMITY ANGIOGRAPHY Right 10/04/2018   Procedure: LOWER EXTREMITY ANGIOGRAPHY;  Surgeon: Algernon Huxley, MD;  Location: [place] CV LAB;  Service: Cardiovascular;  Laterality: Right;  . PERIPHERAL VASCULAR CATHETERIZATION Left 01/18/2015   Procedure: Lower Extremity Angiography;  Surgeon: Algernon Huxley, MD;  Location: [place] CV LAB;  Service: Cardiovascular;  Laterality: Left;  . PERIPHERAL VASCULAR CATHETERIZATION N/A 01/18/2015   Procedure: Lower Extremity Intervention;  Surgeon: Algernon Huxley, MD;  Location: [place] CV LAB;  Service: Cardiovascular;  Laterality: N/A;  . PERIPHERAL VASCULAR CATHETERIZATION  07/30/2015   Procedure: Lower Extremity Intervention;  Surgeon: Algernon Huxley, MD;  Location: [place] CV LAB;  Service: Cardiovascular;;  . PERIPHERAL VASCULAR CATHETERIZATION N/A 07/30/2015   Procedure: Abdominal Aortogram w/Lower Extremity;  Surgeon: Algernon Huxley, MD;  Location: [place] CV LAB;  Service: Cardiovascular;  Laterality: N/A;  . PERIPHERAL VASCULAR CATHETERIZATION Left 08/22/2015   Procedure: Lower Extremity Angiography;  Surgeon: Algernon Huxley, MD;  Location: [place] CV LAB;  Service: Cardiovascular;  Laterality: Left;  . PERIPHERAL VASCULAR CATHETERIZATION Left 08/22/2015   Procedure: Lower Extremity Intervention;  Surgeon: Algernon Huxley, MD;  Location: [place] CV LAB;  Service: Cardiovascular;  Laterality: Left;  . PERIPHERAL VASCULAR CATHETERIZATION Right 03/31/2016   Procedure: Lower Extremity Angiography;  Surgeon: Algernon Huxley, MD;  Location: [place] CV LAB;  Service: Cardiovascular;  Laterality: Right;  . PERIPHERAL VASCULAR CATHETERIZATION  03/31/2016   Procedure: Lower Extremity Intervention;  Surgeon: Algernon Huxley, MD;  Location: [place] CV LAB;  Service: Cardiovascular;;  . PERIPHERAL VASCULAR CATHETERIZATION Left 04/10/2016    Procedure: Lower Extremity Angiography;  Surgeon: Algernon Huxley, MD;  Location: [place] CV LAB;  Service: Cardiovascular;  Laterality: Left;  [name] VACUUM ASSISTED CLOSURE CHANGE Left 10/03/2019   Procedure: LEFT THIGH VACUUM ASSISTED CLOSURE CHANGE;  Surgeon: Algernon Huxley, MD;  Location: [facility];  Service: General;  Laterality: Left;  . WOUND DEBRIDEMENT Left 10/18/2015   Procedure: DEBRIDEMENT WOUND   ( LEFT BKA DEBRIDEMENT );  Surgeon: Algernon Huxley, MD;  Location: [facility];  Service: Vascular;  Laterality: Left;    Family History  Problem Relation Age of Onset  . Heart attack Mother   . Varicose Veins Neg Hx     No Known Allergies  CBC Latest Ref Rng & Units 05/07/2020 04/29/2020 04/28/2020  WBC 3.4 - 10.8 x10E3/uL 12.1(H) 14.7(H) 20.2(H)  Hemoglobin 13.0 - 17.7 g/dL 11.2(L) 9.6(L) 10.4(L)  Hematocrit 37.5 - 51.0 % 36.6(L) 29.0(L) 30.4(L)  Platelets 150 - 450 x10E3/uL 342 173 158      CMP     Component Value Date/Time   NA 140 04/30/2020 0650   NA 136 12/01/2013 1317   K 3.4 (L) 04/30/2020 0650   K 3.9 12/01/2013 1317   CL 110 04/30/2020 0650   CL 100 12/01/2013 1317   CO2 23 04/30/2020 0650   CO2 33 (H) 12/01/2013 1317   GLUCOSE 113 (H) 04/30/2020 0650   GLUCOSE 91 12/01/2013 1317   BUN 13 04/30/2020 0650   BUN 24 (H) 09/25/2014 0846   CREATININE 0.96 04/30/2020 0650   CREATININE 1.29 09/25/2014 0846   CALCIUM 8.0 (L) 04/30/2020 0650   CALCIUM 9.4 12/01/2013 1317   PROT 5.7 (L) 04/30/2020 0650   PROT 6.5 03/25/2013 0351   ALBUMIN 3.1 (L) 04/30/2020 0650   ALBUMIN 1.2 (L) 03/25/2013 0351   AST 13 (L) 04/30/2020 0650   AST 45 (H) 03/25/2013 0351   ALT 11 04/30/2020 0650   ALT 35 03/25/2013 0351   ALKPHOS 66 04/30/2020 0650   ALKPHOS 71 03/25/2013 0351   BILITOT 1.0 04/30/2020 0650   BILITOT 0.3 03/25/2013 0351   GFRNONAA >60 04/30/2020 0650   GFRNONAA 60 (L) 09/25/2014 0846   GFRNONAA >60 03/16/2014 0750   GFRAA >60 04/30/2020 0650   GFRAA >60 09/25/2014 0846    GFRAA >60 03/16/2014 0750         Assessment & Plan:   1. PAD (peripheral artery disease) (HCC)  Recommend:  The patient has evidence of atherosclerosis of the lower extremities with claudication.  The patient does not voice lifestyle limiting changes at this point in time.  Noninvasive studies do not suggest clinically significant change.  No invasive studies, angiography or surgery at this time The patient should continue walking and begin a more formal exercise program.  The patient should continue antiplatelet therapy and aggressive treatment of the lipid abnormalities  No changes in the patient's medications at this time  The patient should continue wearing graduated compression socks 10-15 mmHg strength to control the mild edema.    2. Essential hypertension Continue antihypertensive medications as already ordered, these medications have been reviewed and there are no changes at this time.   3. Hyperlipidemia, unspecified hyperlipidemia type Continue statin as ordered and reviewed, no changes at this time    Current Outpatient Medications on File Prior to Visit  Medication Sig Dispense Refill  . acetaminophen (TYLENOL) 325 MG tablet Take 650 mg by mouth every 4 (four) hours as needed for fever.     [name] acidophilus (RISAQUAD) CAPS capsule Take 1 capsule by mouth 2 (two) times daily.    [name] apixaban (ELIQUIS) 2.5 MG TABS tablet Take 1 tablet (2.5 mg total) by mouth 2 (two) times daily. 60 tablet 3  . ascorbic acid (VITAMIN C) 500 MG tablet Take 500 mg by mouth 2 (two) times daily.    . baclofen (LIORESAL) 10 MG tablet Take 10 mg by mouth 3 (three) times daily. Hold for sedation    . docusate  sodium (COLACE) 100 MG capsule Take 100 mg by mouth daily as needed for mild constipation.    . fentaNYL (DURAGESIC) 25 MCG/HR Place 1 patch onto the skin every 3 (three) days.     . ferrous sulfate 324 (65 Fe) MG TBEC Take 324 mg by mouth 2 (two) times daily.     . furosemide (LASIX)  40 MG tablet Take 1 tablet (40 mg total) by mouth daily. 30 tablet   . gabapentin (NEURONTIN) 100 MG capsule Take 100 mg by mouth at bedtime.     [name] loperamide (IMODIUM) 2 MG capsule Take 2 mg by mouth 3 (three) times daily as needed for diarrhea or loose stools.     . Melatonin 5 MG TABS Take 5 mg by mouth at bedtime as needed (insomnia).     . nitroGLYCERIN (NITROSTAT) 0.4 MG SL tablet Place 0.14 mg under the tongue every 5 (five) minutes as needed for chest pain.    . Omega-3 Fatty Acids (FISH OIL) 1000 MG CAPS Take 1,000 mg by mouth daily.     . Oxycodone HCl 10 MG TABS Take 10 mg by mouth every 4 (four) hours as needed (pain).     [name] oxymetazoline (AFRIN) 0.05 % nasal spray Place 2 sprays into both nostrils 2 (two) times daily as needed (For nose bleeds).     . potassium chloride SA (K-DUR,KLOR-CON) 20 MEQ tablet Take 20 mEq by mouth daily. With or after meal    . sodium chloride (OCEAN) 0.65 % SOLN nasal spray Place 2 sprays into both nostrils 2 (two) times daily as needed for congestion.    . tamsulosin (FLOMAX) 0.4 MG CAPS capsule Take 1 capsule (0.4 mg total) by mouth daily. 30 capsule 0  . TRELEGY ELLIPTA 100-62.5-25 MCG/INH AEPB Inhale 1 puff into the lungs every morning.     [name] ammonium lactate (AMLACTIN) 12 % cream Apply topically 2 (two) times daily. Apply to right foot  (Patient not taking: Reported on 06/20/2020)    . atorvastatin (LIPITOR) 10 MG tablet Take 1 tablet (10 mg total) by mouth daily. (Patient taking differently: Take 10 mg by mouth at bedtime. ) 30 tablet 11  . brimonidine-timolol (COMBIGAN) 0.2-0.5 % ophthalmic solution Place 1 drop into the right eye 2 (two) times daily.  (Patient not taking: Reported on 06/20/2020)    . ciclopirox (PENLAC) 8 % solution Apply 1 application topically daily. Apply solutions to toenails  (Patient not taking: Reported on 06/20/2020)    . Difluprednate (DUREZOL) 0.05 % EMUL Place 1 drop into the right eye 3 (three) times daily.  (Patient not  taking: Reported on 04/27/2020)    . Difluprednate (DUREZOL) 0.05 % EMUL Apply 1 drop to eye daily. (Patient not taking: Reported on 04/27/2020)    . guaiFENesin (ROBITUSSIN) 100 MG/5ML SOLN Take 15 mLs by mouth 3 (three) times daily as needed for cough (congestion).  (Patient not taking: Reported on 06/20/2020)    . ipratropium-albuterol (DUONEB) 0.5-2.5 (3) MG/3ML SOLN Take by nebulization 3 (three) times daily as needed. (Patient not taking: Reported on 05/10/2020)    . Morphine Sulfate (MORPHINE CONCENTRATE) 10 mg / 0.5 ml concentrated solution  (Patient not taking: Reported on 04/27/2020)    . pantoprazole (PROTONIX) 40 MG tablet Take 1 tablet (40 mg total) by mouth 2 (two) times daily. 60 tablet 0  . promethazine (PHENERGAN) 25 MG/ML injection  (Patient not taking: Reported on 05/07/2020)    . triamcinolone cream (KENALOG) 0.1 % Apply 1  application topically 2 (two) times daily. (Patient not taking: Reported on 04/27/2020)     No current facility-administered medications on file prior to visit.    There are no Patient Instructions on file for this visit. No follow-ups on file.   Kris Hartmann, NP

## 2020-06-25 ENCOUNTER — Telehealth: Payer: Self-pay | Admitting: *Deleted

## 2020-06-25 NOTE — Telephone Encounter (Signed)
Tonya with [facility] called asking if patient lab appointment for 08/14/20 could have his labs drawn at their facilty instead of bringing him to office for labs then another trip for doctor visit. Please return her call [phone]

## 2020-06-25 NOTE — Telephone Encounter (Signed)
Ok

## 2020-06-26 ENCOUNTER — Other Ambulatory Visit: Payer: Self-pay

## 2020-06-26 DIAGNOSIS — D500 Iron deficiency anemia secondary to blood loss (chronic): Secondary | ICD-10-CM

## 2020-06-26 NOTE — Telephone Encounter (Signed)
Left message for Timothy Juarez to return call with fax number.

## 2020-06-26 NOTE — Telephone Encounter (Signed)
Lab orders faxed to Marliss Coots, RN @ [facility]. Fax confirmation received  Fax: [phone]

## 2020-06-26 NOTE — Telephone Encounter (Signed)
Can you please fax orders to them for what to draw please

## 2020-06-26 NOTE — Telephone Encounter (Signed)
The appt is in December with labs a couple days prior.  We will get the labs faxed over prior to visit.  Thanks

## 2020-08-13 ENCOUNTER — Encounter: Payer: Self-pay | Admitting: Oncology

## 2020-08-14 ENCOUNTER — Inpatient Hospital Stay: Payer: Medicare Other

## 2020-08-16 ENCOUNTER — Inpatient Hospital Stay: Payer: Medicare Other | Attending: Oncology | Admitting: Oncology

## 2020-08-16 ENCOUNTER — Encounter: Payer: Self-pay | Admitting: Oncology

## 2020-08-16 VITALS — BP 152/74 | HR 59 | Temp 98.2°F

## 2020-08-16 DIAGNOSIS — J449 Chronic obstructive pulmonary disease, unspecified: Secondary | ICD-10-CM | POA: Insufficient documentation

## 2020-08-16 DIAGNOSIS — I509 Heart failure, unspecified: Secondary | ICD-10-CM | POA: Diagnosis not present

## 2020-08-16 DIAGNOSIS — R748 Abnormal levels of other serum enzymes: Secondary | ICD-10-CM

## 2020-08-16 DIAGNOSIS — K259 Gastric ulcer, unspecified as acute or chronic, without hemorrhage or perforation: Secondary | ICD-10-CM | POA: Insufficient documentation

## 2020-08-16 DIAGNOSIS — I82409 Acute embolism and thrombosis of unspecified deep veins of unspecified lower extremity: Secondary | ICD-10-CM

## 2020-08-16 DIAGNOSIS — D500 Iron deficiency anemia secondary to blood loss (chronic): Secondary | ICD-10-CM | POA: Diagnosis not present

## 2020-08-16 DIAGNOSIS — Z7901 Long term (current) use of anticoagulants: Secondary | ICD-10-CM | POA: Diagnosis not present

## 2020-08-16 DIAGNOSIS — Z86718 Personal history of other venous thrombosis and embolism: Secondary | ICD-10-CM | POA: Diagnosis present

## 2020-08-16 DIAGNOSIS — D509 Iron deficiency anemia, unspecified: Secondary | ICD-10-CM | POA: Diagnosis not present

## 2020-08-16 DIAGNOSIS — K2100 Gastro-esophageal reflux disease with esophagitis, without bleeding: Secondary | ICD-10-CM | POA: Diagnosis not present

## 2020-08-16 DIAGNOSIS — E785 Hyperlipidemia, unspecified: Secondary | ICD-10-CM | POA: Diagnosis not present

## 2020-08-16 DIAGNOSIS — Z89612 Acquired absence of left leg above knee: Secondary | ICD-10-CM | POA: Diagnosis not present

## 2020-08-16 DIAGNOSIS — Z79899 Other long term (current) drug therapy: Secondary | ICD-10-CM | POA: Insufficient documentation

## 2020-08-16 DIAGNOSIS — I110 Hypertensive heart disease with heart failure: Secondary | ICD-10-CM | POA: Insufficient documentation

## 2020-08-16 DIAGNOSIS — Z95828 Presence of other vascular implants and grafts: Secondary | ICD-10-CM

## 2020-08-16 DIAGNOSIS — I739 Peripheral vascular disease, unspecified: Secondary | ICD-10-CM | POA: Insufficient documentation

## 2020-08-16 DIAGNOSIS — Z87891 Personal history of nicotine dependence: Secondary | ICD-10-CM | POA: Diagnosis not present

## 2020-08-16 DIAGNOSIS — K449 Diaphragmatic hernia without obstruction or gangrene: Secondary | ICD-10-CM | POA: Diagnosis not present

## 2020-08-16 DIAGNOSIS — J479 Bronchiectasis, uncomplicated: Secondary | ICD-10-CM | POA: Diagnosis not present

## 2020-08-16 NOTE — Progress Notes (Signed)
[facility] Follow up Visit:  Patient Care Team: Gennie Alma, MD as PCP - General (General Practice)  REASON FOR VISIT Follow up for treatment of Recurrent DVT, anticoagulation, iron deficiency anemia  PERTINENT HEMATOLOGY HISTORY [name] 69 y.o. male with PMH listed as below is here for evaluation of recurrent DVT and management. Patient is accompanied by RN from [facility] at [facility].  Patient is a poor historian. He remembers having leg clots for more than one time. He remembers taking Eliquis in the past and recently his anticoagulation has been switched to [place].  2 Medical records from nursing home by patient's PCP Dr.Slade-Hartman, patient had been on Eliquis 5mg  BID for previous DVTs. His medical records showed he had acute DVT at the left common femoral vein on 11/26/2016, and he was placed on Eliquis 2.5mg  BID. Later when another venous doppler was obtained on 02/15/2017 due to persistent pain and swelling, doppler showed persist deep vein thrombosis involving proxima to distal femoral vein. Common femoral vein has normal compression. There was a note on the second doppler report on 03/03/2017 that patient was taking Eliquis 5mg  BID, which was stopped on 7/19 and patient was placed on Lovenox. Patient had swelling and pain of his left stump and symptoms does not improve with being on Lovenox. Xarelto 15mg  BID was started on 03/05/2017 and Lovenox was discontinued on 03/09/2017 with a plan to switch to Xarelto 20mg  daily after 21 days.  It was mentioned in nursing home note that patient had been on Warfarin previously and acquired blood clot while on warfarin. Patient does not remember this and the details of his?warfarin resistance is unclear.  3 His Lupus anticoagulant testing was positive due to prolonged DRVVT, although possibly falsely positive due to [place]. He was offered to be switched to Lovenox shots as alternative for treatment of  possible lupus anticoagulant hypercoagulable state, patient prefers to stay on Xalreto 20mg  daily and has been doing well on that.   Repeat anti phospholipid panel negative.    admitted in Feb 2019 due to acute on chronic blood loss anemia, hemoglobin 5.3 on presentation.He wasansfused with 2 units of PRBC and IV iron infusion..  Xarelto was discontinued due to GI bleeding.   IVC filter was placed on 2/25/ 2019 given patient's high risk of DVT recurrence.  He has had extensive GI work up including upper and lower endoscopy and capsule study, did not reveal active bleeding sites.  Plan to repeat EGD for gastric mapping and colonoscopy in 6-8 months due to poor prep in 09/2017  # 04/30/2020- 9/13/201 pt was hospitalized due to hematemesis with bright red blood, pain with swallowing,  GI work up showed esophageal ulcers w/ stigmata of recent bleeding. Pt did receive 3 days of IV protonix and will be d/c home w/ po pantoprazole 40mg  BID. Patient has been on Eliquis 5mg  BID prior to this hospitalization and eliquis was held.   INTERVAL HISTORY Timothy Juarez is a 69 y.o. male who has above history reviewed by me today presents for follow up visit for iron deficiency anemia, and history of recurrent DVT Patient takes Eliquis 2.5 mg twice daily.  No bleeding events.  No lower extremity swelling or erythema. Is also on ferrous sulfate 325 mg twice daily.  Patient is a poor historian.  He denies any new complaints today.  06/18/2020, upper endoscopy by Dr. Leron Croak duodenal bulb and second portion of the duodenum.  Erosive gastropathy with  no bleeding.  Erythematous mucosa in the gastrics.  Biopsied.  Small hiatal hernia.  LA grade a reflux esophagitis with no bleeding. Biopsy showed chronic gastritis  Review of Systems  Constitutional: Negative for appetite change, chills, fatigue and fever.  HENT:   Negative for voice change.   Eyes: Negative for eye problems and icterus.  Respiratory: Negative  for chest tightness, cough and shortness of breath.   Cardiovascular: Negative for chest pain and leg swelling.  Gastrointestinal: Negative for abdominal distention, abdominal pain, blood in stool, nausea and vomiting.  Endocrine: Negative for hot flashes.  Genitourinary: Negative for difficulty urinating, dysuria and frequency.   Musculoskeletal: Negative for arthralgias.       Left lower extremity stump no swelling  Skin: Negative for itching and rash.  Neurological: Negative for light-headedness and numbness.  Hematological: Negative for adenopathy. Does not bruise/bleed easily.  Psychiatric/Behavioral: Negative for confusion.    MEDICAL HISTORY: Past Medical History:  Diagnosis Date  . Acute embolism and thombos unsp deep vn unsp lower extremity ([place])   . Acute respiratory failure ([place])   . AKI (acute kidney injury) ([place]) 12/27/2018  . Allergy   . Anemia   . Aneurysm of unspecified site ([place])   . ARF (acute respiratory failure) ([place])    H/O  . Bronchitis   . Cellulitis and abscess of leg 09/27/2019  . CHF (congestive heart failure) ([place])   . COPD (chronic obstructive pulmonary disease) ([place])   . Cough   . Epistaxis   . Gastrointestinal hemorrhage   . GERD (gastroesophageal reflux disease)   . Gout   . Hematemesis 04/27/2020  . High anion gap metabolic acidosis 8/76/8115  . Hyperlipidemia   . Hypertension   . Hypokalemia   . Infection of above knee amputation stump ([place]) 09/27/2019  . Insomnia   . Iron deficiency anemia 04/21/2017  . Muscle contracture    MUSCLE SPASMS, muscle weakness  . Peripheral vascular disease ([place])   . Pneumonia   . Pressure ulcer   . Severe sepsis ([place]) 12/27/2018  . Toxic encephalopathy 12/27/2018    SURGICAL HISTORY: Past Surgical History:  Procedure Laterality Date  . AMPUTATION Left 09/19/2015   Procedure: AMPUTATION BELOW KNEE;  Surgeon: Algernon Huxley, MD;  Location: [facility] ORS;  Service: Vascular;  Laterality: Left;  . AMPUTATION Left 11/01/2015    Procedure: AMPUTATION ABOVE KNEE;  Surgeon: Algernon Huxley, MD;  Location: [facility]S;  Service: Vascular;  Laterality: Left;  . AMPUTATION Left 09/29/2019   Procedure: IRRIGATION AND DEBRIDEMENT OF LEFT AKA;  Surgeon: Algernon Huxley, MD;  Location: [facility]S;  Service: General;  Laterality: Left;  . APPLICATION OF WOUND VAC Left 10/18/2015   Procedure: APPLICATION OF WOUND VAC;  Surgeon: Algernon Huxley, MD;  Location: [facility]S;  Service: Vascular;  Laterality: Left;  . APPLICATION OF WOUND VAC Left 09/30/2019   Procedure: APPLICATION OF WOUND VAC;  Surgeon: Katha Cabal, MD;  Location: [facility];  Service: Vascular;  Laterality: Left;  serial # [id number]  . CARDIAC CATHETERIZATION    . CATARACT EXTRACTION W/PHACO Right 08/18/2019   Procedure: CATARACT EXTRACTION PHACO AND INTRAOCULAR LENS PLACEMENT ([place]) RIGHT;  Surgeon: Birder Robson, MD;  Location: [facility];  Service: Ophthalmology;  Laterality: Right;  [place] 03:29.0 CDE 45.68 Fluid Pack Lot # [id number]  . CENTRAL VENOUS CATHETER INSERTION Right 09/30/2019   Procedure: INSERTION CENTRAL LINE ADULT;  Surgeon: Katha Cabal, MD;  Location: [facility]S;  Service: Vascular;  Laterality:  Right;  [name] COLONOSCOPY WITH PROPOFOL N/A 05/25/2017   Procedure: COLONOSCOPY WITH PROPOFOL;  Surgeon: Jonathon Bellows, MD;  Location: [facility] ENDOSCOPY;  Service: Gastroenterology;  Laterality: N/A;  . COLONOSCOPY WITH PROPOFOL N/A 10/14/2017   Procedure: COLONOSCOPY WITH PROPOFOL;  Surgeon: Jonathon Bellows, MD;  Location: [facility] ENDOSCOPY;  Service: Gastroenterology;  Laterality: N/A;  . ESOPHAGOGASTRODUODENOSCOPY (EGD) WITH PROPOFOL N/A 05/25/2017   Procedure: ESOPHAGOGASTRODUODENOSCOPY (EGD) WITH PROPOFOL;  Surgeon: Jonathon Bellows, MD;  Location: [facility] ENDOSCOPY;  Service: Gastroenterology;  Laterality: N/A;  . ESOPHAGOGASTRODUODENOSCOPY (EGD) WITH PROPOFOL N/A 10/14/2017   Procedure: ESOPHAGOGASTRODUODENOSCOPY (EGD) WITH PROPOFOL;  Surgeon: Jonathon Bellows, MD;  Location: [facility] ENDOSCOPY;   Service: Gastroenterology;  Laterality: N/A;  . ESOPHAGOGASTRODUODENOSCOPY (EGD) WITH PROPOFOL N/A 04/27/2020   Procedure: ESOPHAGOGASTRODUODENOSCOPY (EGD) WITH PROPOFOL;  Surgeon: Lin Landsman, MD;  Location: [facility] ENDOSCOPY;  Service: Gastroenterology;  Laterality: N/A;  . ESOPHAGOGASTRODUODENOSCOPY (EGD) WITH PROPOFOL N/A 06/18/2020   Procedure: ESOPHAGOGASTRODUODENOSCOPY (EGD) WITH PROPOFOL;  Surgeon: Lin Landsman, MD;  Location: [facility] ENDOSCOPY;  Service: Gastroenterology;  Laterality: N/A;  . EYE SURGERY    . GIVENS CAPSULE STUDY N/A 07/14/2017   Procedure: GIVENS CAPSULE STUDY;  Surgeon: Jonathon Bellows, MD;  Location: [facility] ENDOSCOPY;  Service: Gastroenterology;  Laterality: N/A;  . GIVENS CAPSULE STUDY N/A 10/30/2017   Procedure: GIVENS CAPSULE STUDY 12 HR;  Surgeon: Jonathon Bellows, MD;  Location: [facility] ENDOSCOPY;  Service: Gastroenterology;  Laterality: N/A;  . IVC FILTER INSERTION N/A 10/12/2017   Procedure: IVC FILTER INSERTION;  Surgeon: Algernon Huxley, MD;  Location: [place] CV LAB;  Service: Cardiovascular;  Laterality: N/A;  . LOWER EXTREMITY ANGIOGRAPHY Right 10/04/2018   Procedure: LOWER EXTREMITY ANGIOGRAPHY;  Surgeon: Algernon Huxley, MD;  Location: [place] CV LAB;  Service: Cardiovascular;  Laterality: Right;  . PERIPHERAL VASCULAR CATHETERIZATION Left 01/18/2015   Procedure: Lower Extremity Angiography;  Surgeon: Algernon Huxley, MD;  Location: [place] CV LAB;  Service: Cardiovascular;  Laterality: Left;  . PERIPHERAL VASCULAR CATHETERIZATION N/A 01/18/2015   Procedure: Lower Extremity Intervention;  Surgeon: Algernon Huxley, MD;  Location: [place] CV LAB;  Service: Cardiovascular;  Laterality: N/A;  . PERIPHERAL VASCULAR CATHETERIZATION  07/30/2015   Procedure: Lower Extremity Intervention;  Surgeon: Algernon Huxley, MD;  Location: [place] CV LAB;  Service: Cardiovascular;;  . PERIPHERAL VASCULAR CATHETERIZATION N/A 07/30/2015   Procedure: Abdominal Aortogram  w/Lower Extremity;  Surgeon: Algernon Huxley, MD;  Location: [place] CV LAB;  Service: Cardiovascular;  Laterality: N/A;  . PERIPHERAL VASCULAR CATHETERIZATION Left 08/22/2015   Procedure: Lower Extremity Angiography;  Surgeon: Algernon Huxley, MD;  Location: [place] CV LAB;  Service: Cardiovascular;  Laterality: Left;  . PERIPHERAL VASCULAR CATHETERIZATION Left 08/22/2015   Procedure: Lower Extremity Intervention;  Surgeon: Algernon Huxley, MD;  Location: [place] CV LAB;  Service: Cardiovascular;  Laterality: Left;  . PERIPHERAL VASCULAR CATHETERIZATION Right 03/31/2016   Procedure: Lower Extremity Angiography;  Surgeon: Algernon Huxley, MD;  Location: [place] CV LAB;  Service: Cardiovascular;  Laterality: Right;  . PERIPHERAL VASCULAR CATHETERIZATION  03/31/2016   Procedure: Lower Extremity Intervention;  Surgeon: Algernon Huxley, MD;  Location: [place] CV LAB;  Service: Cardiovascular;;  . PERIPHERAL VASCULAR CATHETERIZATION Left 04/10/2016   Procedure: Lower Extremity Angiography;  Surgeon: Algernon Huxley, MD;  Location: [place] CV LAB;  Service: Cardiovascular;  Laterality: Left;  [name] VACUUM ASSISTED CLOSURE CHANGE Left 10/03/2019   Procedure: LEFT THIGH VACUUM ASSISTED CLOSURE CHANGE;  Surgeon:  Algernon Huxley, MD;  Location: [facility] ORS;  Service: General;  Laterality: Left;  . WOUND DEBRIDEMENT Left 10/18/2015   Procedure: DEBRIDEMENT WOUND   ( LEFT BKA DEBRIDEMENT );  Surgeon: Algernon Huxley, MD;  Location: [facility] ORS;  Service: Vascular;  Laterality: Left;    SOCIAL HISTORY: Social History   Socioeconomic History  . Marital status: Legally Separated    Spouse name: Not on file  . Number of children: Not on file  . Years of education: Not on file  . Highest education level: Not on file  Occupational History  . Not on file  Tobacco Use  . Smoking status: Former Smoker    Packs/day: 1.00    Years: 44.00    Pack years: 44.00    Types: Cigarettes    Quit date: 11/17/2012    Years since  quitting: 7.7  . Smokeless tobacco: Never Used  Vaping Use  . Vaping Use: Never used  Substance and Sexual Activity  . Alcohol use: Not Currently  . Drug use: Never  . Sexual activity: Never  Other Topics Concern  . Not on file  Social History Narrative  . Not on file   Social Determinants of Health   Financial Resource Strain: Not on file  Food Insecurity: Not on file  Transportation Needs: Not on file  Physical Activity: Not on file  Stress: Not on file  Social Connections: Not on file  Intimate Partner Violence: Not on file    FAMILY HISTORY: Mother died from an MI. Father died from a brain tumor  ALLERGIES:  has No Known Allergies.  MEDICATIONS:  Current Outpatient Medications  Medication Sig Dispense Refill  . acetaminophen (TYLENOL) 325 MG tablet Take 650 mg by mouth every 4 (four) hours as needed for fever.     [name] acidophilus (RISAQUAD) CAPS capsule Take 1 capsule by mouth 2 (two) times daily.    [name] apixaban (ELIQUIS) 2.5 MG TABS tablet Take 1 tablet (2.5 mg total) by mouth 2 (two) times daily. 60 tablet 3  . ascorbic acid (VITAMIN C) 500 MG tablet Take 500 mg by mouth 2 (two) times daily.    [name] atorvastatin (LIPITOR) 10 MG tablet Take 1 tablet (10 mg total) by mouth daily. (Patient taking differently: Take 10 mg by mouth at bedtime.) 30 tablet 11  . baclofen (LIORESAL) 10 MG tablet Take 10 mg by mouth 3 (three) times daily. Hold for sedation    . docusate sodium (COLACE) 100 MG capsule Take 100 mg by mouth daily as needed for mild constipation.    . fentaNYL (DURAGESIC) 25 MCG/HR Place 1 patch onto the skin every 3 (three) days.     . ferrous sulfate 324 (65 Fe) MG TBEC Take 324 mg by mouth 2 (two) times daily.     . furosemide (LASIX) 40 MG tablet Take 1 tablet (40 mg total) by mouth daily. 30 tablet   . gabapentin (NEURONTIN) 100 MG capsule Take 100 mg by mouth at bedtime.     [name] guaiFENesin (ROBITUSSIN) 100 MG/5ML SOLN Take 15 mLs by mouth 3 (three) times daily as  needed for cough (congestion).    [name] loperamide (IMODIUM) 2 MG capsule Take 2 mg by mouth 3 (three) times daily as needed for diarrhea or loose stools.     . Melatonin 5 MG TABS Take 5 mg by mouth at bedtime as needed (insomnia).     . nitroGLYCERIN (NITROSTAT) 0.4 MG SL tablet Place 0.14 mg under the tongue  every 5 (five) minutes as needed for chest pain.    . Omega-3 Fatty Acids (FISH OIL) 1000 MG CAPS Take 1,000 mg by mouth daily.     . Oxycodone HCl 10 MG TABS Take 10 mg by mouth every 4 (four) hours as needed (pain).     [name] oxymetazoline (AFRIN) 0.05 % nasal spray Place 2 sprays into both nostrils 2 (two) times daily as needed (For nose bleeds).     . pantoprazole (PROTONIX) 40 MG tablet Take 1 tablet (40 mg total) by mouth 2 (two) times daily. 60 tablet 0  . potassium chloride SA (K-DUR,KLOR-CON) 20 MEQ tablet Take 20 mEq by mouth daily. With or after meal    . sodium chloride (OCEAN) 0.65 % SOLN nasal spray Place 2 sprays into both nostrils 2 (two) times daily as needed for congestion.    . tamsulosin (FLOMAX) 0.4 MG CAPS capsule Take 1 capsule (0.4 mg total) by mouth daily. 30 capsule 0  . TRELEGY ELLIPTA 100-62.5-25 MCG/INH AEPB Inhale 1 puff into the lungs every morning.     [name] ammonium lactate (AMLACTIN) 12 % cream Apply topically 2 (two) times daily. Apply to right foot  (Patient not taking: No sig reported)    . brimonidine-timolol (COMBIGAN) 0.2-0.5 % ophthalmic solution Place 1 drop into the right eye 2 (two) times daily.  (Patient not taking: No sig reported)    . ciclopirox (PENLAC) 8 % solution Apply 1 application topically daily. Apply solutions to toenails  (Patient not taking: No sig reported)    . Difluprednate (DUREZOL) 0.05 % EMUL Place 1 drop into the right eye 3 (three) times daily.  (Patient not taking: No sig reported)    . Difluprednate (DUREZOL) 0.05 % EMUL Apply 1 drop to eye daily. (Patient not taking: No sig reported)    . ipratropium-albuterol (DUONEB) 0.5-2.5 (3)  MG/3ML SOLN Take by nebulization 3 (three) times daily as needed. (Patient not taking: No sig reported)    . Morphine Sulfate (MORPHINE CONCENTRATE) 10 mg / 0.5 ml concentrated solution  (Patient not taking: No sig reported)    . promethazine (PHENERGAN) 25 MG/ML injection  (Patient not taking: No sig reported)    . triamcinolone cream (KENALOG) 0.1 % Apply 1 application topically 2 (two) times daily. (Patient not taking: No sig reported)     No current facility-administered medications for this visit.    PHYSICAL EXAMINATION:  ECOG PERFORMANCE STATUS: 1 - Symptomatic but completely ambulatory  Vitals:   08/16/20 1025  BP: (!) 152/74  Pulse: (!) 59  Temp: 98.2 F (36.8 C)  SpO2: 94%    There were no vitals filed for this visit. Physical Exam Constitutional:      General: He is not in acute distress.    Appearance: He is not diaphoretic.  HENT:     Head: Normocephalic and atraumatic.     Nose: Nose normal.     Mouth/Throat:     Pharynx: No oropharyngeal exudate.  Eyes:     General: No scleral icterus.       Left eye: No discharge.     Pupils: Pupils are equal, round, and reactive to light.  Neck:     Vascular: No JVD.  Cardiovascular:     Rate and Rhythm: Normal rate and regular rhythm.     Heart sounds: Normal heart sounds. No murmur heard.   Pulmonary:     Effort: Pulmonary effort is normal. No respiratory distress.     Breath sounds:  Normal breath sounds. No wheezing or rales.  Chest:     Chest wall: No tenderness.  Abdominal:     General: Bowel sounds are normal. There is no distension.     Palpations: Abdomen is soft. There is no mass.     Tenderness: There is no abdominal tenderness.  Musculoskeletal:        General: No tenderness. Normal range of motion.     Cervical back: Normal range of motion and neck supple.     Comments: left above knee amputation stump non tender or swelling. No edema at right lower extremity  Lymphadenopathy:     Cervical: No  cervical adenopathy.  Skin:    General: Skin is warm and dry.     Findings: No erythema or rash.  Neurological:     Mental Status: He is alert and oriented to person, place, and time.     Cranial Nerves: No cranial nerve deficit.     Motor: No abnormal muscle tone.     Coordination: Coordination normal.       LABORATORY DATA: I have personally reviewed the data as listed:  No visits with results within 1 Month(s) from this visit.  Latest known visit with results is:  Admission on 06/18/2020, Discharged on 06/18/2020  Component Date Value Ref Range Status  . SURGICAL PATHOLOGY 06/18/2020    Final-Edited                   Value:SURGICAL PATHOLOGY CASE: [id number] PATIENT: Timothy Juarez Surgical Pathology Report     Specimen Submitted: A. Stomach, random; cbx  Clinical History: Esophagitis K20.90.  Gastric erosions, hiatal hernia.      DIAGNOSIS: A. STOMACH, RANDOM; COLD BIOPSY: - CHRONIC GASTRITIS WITH REACTIVE CHANGES. - NEGATIVE FOR ACTIVE INFLAMMATION AND H PYLORI. - NEGATIVE FOR INTESTINAL METAPLASIA, DYSPLASIA, AND MALIGNANCY.   GROSS DESCRIPTION: A. Labeled: Random stomach cold biopsies, gastric erosions Received: In formalin Tissue fragment(s): 3 Size: 0.6 x 0.2 x 0.1 cm Description: Aggregate of tan tissue fragments Entirely submitted in 1 cassette.   Final Diagnosis performed by Betsy Pries, MD.   Electronically signed 06/19/2020 10:38:33AM The electronic signature indicates that the named Attending Pathologist has evaluated the specimen Technical component performed at [facility], [address] [place], [place] [address] Lab: [phone] Dir: Rush Farmer,                          MD, MMM  Professional component performed at [facility], [facility], [place], [place], [place] [address] Lab: [phone] Dir: Dellia Nims. Rubinas, MD     RADIOGRAPHIC STUDIES: I have personally reviewed the radiological images as  listed and agree with the findings in the report 04/03/2017 US venous doppler IMPRESSION: Left inguinal/femoral partially thrombosed pseudoaneurysm/hematoma measuring 15 x 5.3 x 6.8 cm.  Reviewed image records from [facility] [place] home 11/26/2016 patient had a venous Doppler extremity left done which showed no occluding acute DVT at the left common femoral vein. 03/03/2017 patient had another venous Doppler extremity left which showed deep vein thromboses involving the proximal to distal femoral vein the common femoral artery now has normal compression.  04/05/2013 CT chest with contrast    Chronic pulmonary disease with areas of atelectasis and bronchiectasis. Continued significant opacification and bullous change in the left lung especially in the upper lobe and superior segment of the  left lower lobe with other patchy areas bilaterally. There may be mild interval  improvement in the area of the lower lingula. Malignancy cannot be excluded in the abnormal areas of the lungs. No central pulmonary arterial filling defect is seen. The bolus is not optimal for evaluation of the lungs  otherwise.    ASSESSMENT/PLAN 1. Iron deficiency anemia due to chronic blood loss   2. Recurrent deep vein thrombosis (DVT) (HCC)   3. Presence of IVC filter   4. Elevated alkaline phosphatase level    #Iron deficiency anemia. I reviewed patient's blood work done at the nursing home.  08/13/2020, hemoglobin 13.7. Iron panel stable. No need for IV Venofer treatments. Continue oral ferrous sulfate 325 mg twice daily.  #History of recurrent left lower extremity deep vein thrombosis, Resumed on Eliquis 2.5 mg twice daily. Tolerates well.  No bleeding events Continue current regimen.  # Presence of IVC filter, recommend patient to vascular surgery.  #Alkaline phosphatase elevation.  Recommend patient to repeat labs today.  He prefers to repeat blood work at the next visit.  Follow up in 3  months.   Earlie Server, MD, PhD Hematology Oncology [place] at [facility]

## 2020-08-16 NOTE — Progress Notes (Signed)
Patient denies new problems/concerns today.    Medications verified with medication list brought from [facility].

## 2020-12-10 ENCOUNTER — Inpatient Hospital Stay: Payer: Medicare Other | Attending: Oncology

## 2020-12-10 ENCOUNTER — Other Ambulatory Visit: Payer: Self-pay

## 2020-12-10 DIAGNOSIS — Z7901 Long term (current) use of anticoagulants: Secondary | ICD-10-CM | POA: Diagnosis not present

## 2020-12-10 DIAGNOSIS — D509 Iron deficiency anemia, unspecified: Secondary | ICD-10-CM | POA: Insufficient documentation

## 2020-12-10 DIAGNOSIS — Z86718 Personal history of other venous thrombosis and embolism: Secondary | ICD-10-CM | POA: Diagnosis not present

## 2020-12-10 DIAGNOSIS — D500 Iron deficiency anemia secondary to blood loss (chronic): Secondary | ICD-10-CM

## 2020-12-10 LAB — CBC WITH DIFFERENTIAL/PLATELET
Abs Immature Granulocytes: 0.06 10*3/uL (ref 0.00–0.07)
Basophils Absolute: 0.1 10*3/uL (ref 0.0–0.1)
Basophils Relative: 1 %
Eosinophils Absolute: 0.1 10*3/uL (ref 0.0–0.5)
Eosinophils Relative: 1 %
HCT: 47.8 % (ref 39.0–52.0)
Hemoglobin: 15.4 g/dL (ref 13.0–17.0)
Immature Granulocytes: 1 %
Lymphocytes Relative: 16 %
Lymphs Abs: 2 10*3/uL (ref 0.7–4.0)
MCH: 26.4 pg (ref 26.0–34.0)
MCHC: 32.2 g/dL (ref 30.0–36.0)
MCV: 81.8 fL (ref 80.0–100.0)
Monocytes Absolute: 0.9 10*3/uL (ref 0.1–1.0)
Monocytes Relative: 7 %
Neutro Abs: 9.6 10*3/uL — ABNORMAL HIGH (ref 1.7–7.7)
Neutrophils Relative %: 74 %
Platelets: 218 10*3/uL (ref 150–400)
RBC: 5.84 MIL/uL — ABNORMAL HIGH (ref 4.22–5.81)
RDW: 16.7 % — ABNORMAL HIGH (ref 11.5–15.5)
WBC: 12.7 10*3/uL — ABNORMAL HIGH (ref 4.0–10.5)
nRBC: 0 % (ref 0.0–0.2)

## 2020-12-10 LAB — FERRITIN: Ferritin: 80 ng/mL (ref 24–336)

## 2020-12-10 LAB — IRON AND TIBC
Iron: 39 ug/dL — ABNORMAL LOW (ref 45–182)
Saturation Ratios: 12 % — ABNORMAL LOW (ref 17.9–39.5)
TIBC: 326 ug/dL (ref 250–450)
UIBC: 287 ug/dL

## 2020-12-10 LAB — COMPREHENSIVE METABOLIC PANEL
ALT: 12 U/L (ref 0–44)
AST: 16 U/L (ref 15–41)
Albumin: 3.8 g/dL (ref 3.5–5.0)
Alkaline Phosphatase: 114 U/L (ref 38–126)
Anion gap: 10 (ref 5–15)
BUN: 19 mg/dL (ref 8–23)
CO2: 27 mmol/L (ref 22–32)
Calcium: 8.8 mg/dL — ABNORMAL LOW (ref 8.9–10.3)
Chloride: 104 mmol/L (ref 98–111)
Creatinine, Ser: 1.22 mg/dL (ref 0.61–1.24)
GFR, Estimated: >60 mL/min (ref >60)
Glucose, Bld: 119 mg/dL — ABNORMAL HIGH (ref 70–99)
Potassium: 4.4 mmol/L (ref 3.5–5.1)
Sodium: 141 mmol/L (ref 135–145)
Total Bilirubin: 0.9 mg/dL (ref 0.3–1.2)
Total Protein: 7.7 g/dL (ref 6.5–8.1)

## 2020-12-12 ENCOUNTER — Encounter: Payer: Self-pay | Admitting: Oncology

## 2020-12-12 ENCOUNTER — Other Ambulatory Visit: Payer: Self-pay

## 2020-12-12 ENCOUNTER — Inpatient Hospital Stay (HOSPITAL_BASED_OUTPATIENT_CLINIC_OR_DEPARTMENT_OTHER): Payer: Medicare Other | Admitting: Oncology

## 2020-12-12 VITALS — BP 148/70 | HR 62 | Temp 98.1°F | Resp 18

## 2020-12-12 DIAGNOSIS — D509 Iron deficiency anemia, unspecified: Secondary | ICD-10-CM | POA: Diagnosis not present

## 2020-12-12 DIAGNOSIS — Z95828 Presence of other vascular implants and grafts: Secondary | ICD-10-CM | POA: Diagnosis not present

## 2020-12-12 DIAGNOSIS — I82409 Acute embolism and thrombosis of unspecified deep veins of unspecified lower extremity: Secondary | ICD-10-CM | POA: Diagnosis not present

## 2020-12-12 DIAGNOSIS — D500 Iron deficiency anemia secondary to blood loss (chronic): Secondary | ICD-10-CM

## 2020-12-12 NOTE — Progress Notes (Signed)
Pt here for follow up. No new concern voiced. Med rec done with facility MAR.

## 2020-12-12 NOTE — Progress Notes (Signed)
[facility] Follow up Visit:  Patient Care Team: Gennie Alma, MD as PCP - General (General Practice) Earlie Server, MD as Medical Oncologist (Oncology)  REASON FOR VISIT Follow up for treatment of Recurrent DVT, anticoagulation, iron deficiency anemia  PERTINENT HEMATOLOGY HISTORY [name] 70 y.o. male with PMH listed as below is here for evaluation of recurrent DVT and management. Patient is accompanied by RN from [facility] at [facility].  Patient is a poor historian. He remembers having leg clots for more than one time. He remembers taking Eliquis in the past and recently his anticoagulation has been switched to [place].  2 Medical records from nursing home by patient's PCP Dr.Slade-Hartman, patient had been on Eliquis 5mg  BID for previous DVTs. His medical records showed he had acute DVT at the left common femoral vein on 11/26/2016, and he was placed on Eliquis 2.5mg  BID. Later when another venous doppler was obtained on 02/15/2017 due to persistent pain and swelling, doppler showed persist deep vein thrombosis involving proxima to distal femoral vein. Common femoral vein has normal compression. There was a note on the second doppler report on 03/03/2017 that patient was taking Eliquis 5mg  BID, which was stopped on 7/19 and patient was placed on Lovenox. Patient had swelling and pain of his left stump and symptoms does not improve with being on Lovenox. Xarelto 15mg  BID was started on 03/05/2017 and Lovenox was discontinued on 03/09/2017 with a plan to switch to Xarelto 20mg  daily after 21 days.  It was mentioned in nursing home note that patient had been on Warfarin previously and acquired blood clot while on warfarin. Patient does not remember this and the details of his?warfarin resistance is unclear.  3 His Lupus anticoagulant testing was positive due to prolonged DRVVT, although possibly falsely positive due to [place]. He was offered to be switched to  Lovenox shots as alternative for treatment of possible lupus anticoagulant hypercoagulable state, patient prefers to stay on Xalreto 20mg  daily and has been doing well on that.   Repeat anti phospholipid panel negative.    admitted in Feb 2019 due to acute on chronic blood loss anemia, hemoglobin 5.3 on presentation.He wasansfused with 2 units of PRBC and IV iron infusion..  Xarelto was discontinued due to GI bleeding.   IVC filter was placed on 2/25/ 2019 given patient's high risk of DVT recurrence.  He has had extensive GI work up including upper and lower endoscopy and capsule study, did not reveal active bleeding sites.  Plan to repeat EGD for gastric mapping and colonoscopy in 6-8 months due to poor prep in 09/2017  # 04/30/2020- 9/13/201 pt was hospitalized due to hematemesis with bright red blood, pain with swallowing,  GI work up showed esophageal ulcers w/ stigmata of recent bleeding. Pt did receive 3 days of IV protonix and will be d/c home w/ po pantoprazole 40mg  BID. Patient has been on Eliquis 5mg  BID prior to this hospitalization and eliquis was held.   # 06/18/2020, upper endoscopy by Dr. Leron Croak duodenal bulb and second portion of the duodenum.  Erosive gastropathy with no bleeding.  Erythematous mucosa in the gastrics.  Biopsied.  Small hiatal hernia.  LA grade a reflux esophagitis with no bleeding. Biopsy showed chronic gastritis   INTERVAL HISTORY Timothy Juarez is a 70 y.o. male who has above history reviewed by me today presents for follow up visit for iron deficiency anemia, and history of recurrent DVT Patient was accompanied by  nursing home facility staff. Patient reports feeling well.  No bleeding events.  He is taking Eliquis 2.5 mg twice daily Left lower extremity stump no swelling or pain    Review of Systems  Constitutional: Negative for appetite change, chills, fatigue and fever.  HENT:   Negative for voice change.   Eyes: Negative for eye problems and  icterus.  Respiratory: Negative for chest tightness, cough and shortness of breath.   Cardiovascular: Negative for chest pain and leg swelling.  Gastrointestinal: Negative for abdominal distention, abdominal pain, blood in stool, nausea and vomiting.  Endocrine: Negative for hot flashes.  Genitourinary: Negative for difficulty urinating, dysuria and frequency.   Musculoskeletal: Negative for arthralgias.       Left lower extremity stump no swelling  Skin: Negative for itching and rash.  Neurological: Negative for light-headedness and numbness.  Hematological: Negative for adenopathy. Does not bruise/bleed easily.  Psychiatric/Behavioral: Negative for confusion.    MEDICAL HISTORY: Past Medical History:  Diagnosis Date  . Acute embolism and thombos unsp deep vn unsp lower extremity ([place])   . Acute respiratory failure ([place])   . AKI (acute kidney injury) ([place]) 12/27/2018  . Allergy   . Anemia   . Aneurysm of unspecified site ([place])   . ARF (acute respiratory failure) ([place])    H/O  . Bronchitis   . Cellulitis and abscess of leg 09/27/2019  . CHF (congestive heart failure) ([place])   . COPD (chronic obstructive pulmonary disease) ([place])   . Cough   . Epistaxis   . Gastrointestinal hemorrhage   . GERD (gastroesophageal reflux disease)   . Gout   . Hematemesis 04/27/2020  . High anion gap metabolic acidosis 02/14/5283  . Hyperlipidemia   . Hypertension   . Hypokalemia   . Infection of above knee amputation stump ([place]) 09/27/2019  . Insomnia   . Iron deficiency anemia 04/21/2017  . Muscle contracture    MUSCLE SPASMS, muscle weakness  . Peripheral vascular disease ([place])   . Pneumonia   . Pressure ulcer   . Severe sepsis ([place]) 12/27/2018  . Toxic encephalopathy 12/27/2018    SURGICAL HISTORY: Past Surgical History:  Procedure Laterality Date  . AMPUTATION Left 09/19/2015   Procedure: AMPUTATION BELOW KNEE;  Surgeon: Algernon Huxley, MD;  Location: [facility] ORS;  Service: Vascular;  Laterality:  Left;  . AMPUTATION Left 11/01/2015   Procedure: AMPUTATION ABOVE KNEE;  Surgeon: Algernon Huxley, MD;  Location: [facility]S;  Service: Vascular;  Laterality: Left;  . AMPUTATION Left 09/29/2019   Procedure: IRRIGATION AND DEBRIDEMENT OF LEFT AKA;  Surgeon: Algernon Huxley, MD;  Location: [facility] ORS;  Service: General;  Laterality: Left;  . APPLICATION OF WOUND VAC Left 10/18/2015   Procedure: APPLICATION OF WOUND VAC;  Surgeon: Algernon Huxley, MD;  Location: [facility] ORS;  Service: Vascular;  Laterality: Left;  . APPLICATION OF WOUND VAC Left 09/30/2019   Procedure: APPLICATION OF WOUND VAC;  Surgeon: Katha Cabal, MD;  Location: [facility]S;  Service: Vascular;  Laterality: Left;  serial # [id number]  . CARDIAC CATHETERIZATION    . CATARACT EXTRACTION W/PHACO Right 08/18/2019   Procedure: CATARACT EXTRACTION PHACO AND INTRAOCULAR LENS PLACEMENT ([place]) RIGHT;  Surgeon: Birder Robson, MD;  Location: [facility];  Service: Ophthalmology;  Laterality: Right;  [place] 03:29.0 CDE 45.68 Fluid Pack Lot # [id number]  . CENTRAL VENOUS CATHETER INSERTION Right 09/30/2019   Procedure: INSERTION CENTRAL LINE ADULT;  Surgeon: Katha Cabal, MD;  Location: [facility]S;  Service: Vascular;  Laterality: Right;  . COLONOSCOPY WITH PROPOFOL N/A 05/25/2017   Procedure: COLONOSCOPY WITH PROPOFOL;  Surgeon: Jonathon Bellows, MD;  Location: [facility] ENDOSCOPY;  Service: Gastroenterology;  Laterality: N/A;  . COLONOSCOPY WITH PROPOFOL N/A 10/14/2017   Procedure: COLONOSCOPY WITH PROPOFOL;  Surgeon: Jonathon Bellows, MD;  Location: [facility] ENDOSCOPY;  Service: Gastroenterology;  Laterality: N/A;  . ESOPHAGOGASTRODUODENOSCOPY (EGD) WITH PROPOFOL N/A 05/25/2017   Procedure: ESOPHAGOGASTRODUODENOSCOPY (EGD) WITH PROPOFOL;  Surgeon: Jonathon Bellows, MD;  Location: [facility] ENDOSCOPY;  Service: Gastroenterology;  Laterality: N/A;  . ESOPHAGOGASTRODUODENOSCOPY (EGD) WITH PROPOFOL N/A 10/14/2017   Procedure: ESOPHAGOGASTRODUODENOSCOPY (EGD) WITH PROPOFOL;  Surgeon: Jonathon Bellows, MD;  Location: [facility] ENDOSCOPY;  Service: Gastroenterology;  Laterality: N/A;  . ESOPHAGOGASTRODUODENOSCOPY (EGD) WITH PROPOFOL N/A 04/27/2020   Procedure: ESOPHAGOGASTRODUODENOSCOPY (EGD) WITH PROPOFOL;  Surgeon: Lin Landsman, MD;  Location: [facility] ENDOSCOPY;  Service: Gastroenterology;  Laterality: N/A;  . ESOPHAGOGASTRODUODENOSCOPY (EGD) WITH PROPOFOL N/A 06/18/2020   Procedure: ESOPHAGOGASTRODUODENOSCOPY (EGD) WITH PROPOFOL;  Surgeon: Lin Landsman, MD;  Location: [facility] ENDOSCOPY;  Service: Gastroenterology;  Laterality: N/A;  . EYE SURGERY    . GIVENS CAPSULE STUDY N/A 07/14/2017   Procedure: GIVENS CAPSULE STUDY;  Surgeon: Jonathon Bellows, MD;  Location: [facility] ENDOSCOPY;  Service: Gastroenterology;  Laterality: N/A;  . GIVENS CAPSULE STUDY N/A 10/30/2017   Procedure: GIVENS CAPSULE STUDY 12 HR;  Surgeon: Jonathon Bellows, MD;  Location: [facility] ENDOSCOPY;  Service: Gastroenterology;  Laterality: N/A;  . IVC FILTER INSERTION N/A 10/12/2017   Procedure: IVC FILTER INSERTION;  Surgeon: Algernon Huxley, MD;  Location: [place] CV LAB;  Service: Cardiovascular;  Laterality: N/A;  . LOWER EXTREMITY ANGIOGRAPHY Right 10/04/2018   Procedure: LOWER EXTREMITY ANGIOGRAPHY;  Surgeon: Algernon Huxley, MD;  Location: [place] CV LAB;  Service: Cardiovascular;  Laterality: Right;  . PERIPHERAL VASCULAR CATHETERIZATION Left 01/18/2015   Procedure: Lower Extremity Angiography;  Surgeon: Algernon Huxley, MD;  Location: [place] CV LAB;  Service: Cardiovascular;  Laterality: Left;  . PERIPHERAL VASCULAR CATHETERIZATION N/A 01/18/2015   Procedure: Lower Extremity Intervention;  Surgeon: Algernon Huxley, MD;  Location: [place] CV LAB;  Service: Cardiovascular;  Laterality: N/A;  . PERIPHERAL VASCULAR CATHETERIZATION  07/30/2015   Procedure: Lower Extremity Intervention;  Surgeon: Algernon Huxley, MD;  Location: [place] CV LAB;  Service: Cardiovascular;;  . PERIPHERAL VASCULAR CATHETERIZATION N/A 07/30/2015    Procedure: Abdominal Aortogram w/Lower Extremity;  Surgeon: Algernon Huxley, MD;  Location: [place] CV LAB;  Service: Cardiovascular;  Laterality: N/A;  . PERIPHERAL VASCULAR CATHETERIZATION Left 08/22/2015   Procedure: Lower Extremity Angiography;  Surgeon: Algernon Huxley, MD;  Location: [place] CV LAB;  Service: Cardiovascular;  Laterality: Left;  . PERIPHERAL VASCULAR CATHETERIZATION Left 08/22/2015   Procedure: Lower Extremity Intervention;  Surgeon: Algernon Huxley, MD;  Location: [place] CV LAB;  Service: Cardiovascular;  Laterality: Left;  . PERIPHERAL VASCULAR CATHETERIZATION Right 03/31/2016   Procedure: Lower Extremity Angiography;  Surgeon: Algernon Huxley, MD;  Location: [place] CV LAB;  Service: Cardiovascular;  Laterality: Right;  . PERIPHERAL VASCULAR CATHETERIZATION  03/31/2016   Procedure: Lower Extremity Intervention;  Surgeon: Algernon Huxley, MD;  Location: [place] CV LAB;  Service: Cardiovascular;;  . PERIPHERAL VASCULAR CATHETERIZATION Left 04/10/2016   Procedure: Lower Extremity Angiography;  Surgeon: Algernon Huxley, MD;  Location: [place] CV LAB;  Service: Cardiovascular;  Laterality: Left;  [name] VACUUM ASSISTED CLOSURE CHANGE Left 10/03/2019   Procedure: LEFT THIGH VACUUM ASSISTED  CLOSURE CHANGE;  Surgeon: Algernon Huxley, MD;  Location: [facility] ORS;  Service: General;  Laterality: Left;  . WOUND DEBRIDEMENT Left 10/18/2015   Procedure: DEBRIDEMENT WOUND   ( LEFT BKA DEBRIDEMENT );  Surgeon: Algernon Huxley, MD;  Location: [facility] ORS;  Service: Vascular;  Laterality: Left;    SOCIAL HISTORY: Social History   Socioeconomic History  . Marital status: Legally Separated    Spouse name: Not on file  . Number of children: Not on file  . Years of education: Not on file  . Highest education level: Not on file  Occupational History  . Not on file  Tobacco Use  . Smoking status: Former Smoker    Packs/day: 1.00    Years: 44.00    Pack years: 44.00    Types: Cigarettes    Quit  date: 11/17/2012    Years since quitting: 8.0  . Smokeless tobacco: Never Used  Vaping Use  . Vaping Use: Never used  Substance and Sexual Activity  . Alcohol use: Not Currently  . Drug use: Never  . Sexual activity: Never  Other Topics Concern  . Not on file  Social History Narrative  . Not on file   Social Determinants of Health   Financial Resource Strain: Not on file  Food Insecurity: Not on file  Transportation Needs: Not on file  Physical Activity: Not on file  Stress: Not on file  Social Connections: Not on file  Intimate Partner Violence: Not on file    FAMILY HISTORY: Mother died from an MI. Father died from a brain tumor  ALLERGIES:  has No Known Allergies.  MEDICATIONS:  Current Outpatient Medications  Medication Sig Dispense Refill  . acetaminophen (TYLENOL) 325 MG tablet Take 650 mg by mouth every 4 (four) hours as needed for fever.     [name] acidophilus (RISAQUAD) CAPS capsule Take 1 capsule by mouth 2 (two) times daily.    [name] apixaban (ELIQUIS) 2.5 MG TABS tablet Take 1 tablet (2.5 mg total) by mouth 2 (two) times daily. 60 tablet 3  . ascorbic acid (VITAMIN C) 500 MG tablet Take 500 mg by mouth 2 (two) times daily.    [name] atorvastatin (LIPITOR) 10 MG tablet Take 1 tablet (10 mg total) by mouth daily. (Patient taking differently: Take 10 mg by mouth at bedtime.) 30 tablet 11  . baclofen (LIORESAL) 10 MG tablet Take 10 mg by mouth 3 (three) times daily. Hold for sedation    . fentaNYL (DURAGESIC) 25 MCG/HR Place 1 patch onto the skin every 3 (three) days.     . ferrous sulfate 324 (65 Fe) MG TBEC Take 324 mg by mouth 2 (two) times daily.     . furosemide (LASIX) 40 MG tablet Take 1 tablet (40 mg total) by mouth daily. 30 tablet   . gabapentin (NEURONTIN) 100 MG capsule Take 100 mg by mouth at bedtime.     [name] guaiFENesin (ROBITUSSIN) 100 MG/5ML SOLN Take 15 mLs by mouth 3 (three) times daily as needed for cough (congestion).    . Melatonin 5 MG TABS Take 5 mg by mouth at  bedtime as needed (insomnia).     . Omega-3 Fatty Acids (FISH OIL) 1000 MG CAPS Take 1,000 mg by mouth daily.     . Oxycodone HCl 10 MG TABS Take 10 mg by mouth every 4 (four) hours as needed (pain).     [name] oxymetazoline (AFRIN) 0.05 % nasal spray Place 2 sprays into both nostrils 2 (two)  times daily as needed (For nose bleeds).     . pantoprazole (PROTONIX) 40 MG tablet Take 1 tablet (40 mg total) by mouth 2 (two) times daily. 60 tablet 0  . potassium chloride SA (K-DUR,KLOR-CON) 20 MEQ tablet Take 20 mEq by mouth daily. With or after meal    . sodium chloride (OCEAN) 0.65 % SOLN nasal spray Place 2 sprays into both nostrils 2 (two) times daily as needed for congestion.    . tamsulosin (FLOMAX) 0.4 MG CAPS capsule Take 1 capsule (0.4 mg total) by mouth daily. 30 capsule 0  . TRELEGY ELLIPTA 100-62.5-25 MCG/INH AEPB Inhale 1 puff into the lungs every morning.     [name] ammonium lactate (AMLACTIN) 12 % cream Apply topically 2 (two) times daily. Apply to right foot    . brimonidine-timolol (COMBIGAN) 0.2-0.5 % ophthalmic solution Place 1 drop into the right eye 2 (two) times daily.  (Patient not taking: No sig reported)    . ciclopirox (PENLAC) 8 % solution Apply 1 application topically daily. Apply solutions to toenails  (Patient not taking: No sig reported)    . Difluprednate (DUREZOL) 0.05 % EMUL Place 1 drop into the right eye 3 (three) times daily.  (Patient not taking: No sig reported)    . Difluprednate (DUREZOL) 0.05 % EMUL Apply 1 drop to eye daily. (Patient not taking: No sig reported)    . docusate sodium (COLACE) 100 MG capsule Take 100 mg by mouth daily as needed for mild constipation. (Patient not taking: Reported on 12/12/2020)    . ipratropium-albuterol (DUONEB) 0.5-2.5 (3) MG/3ML SOLN Take by nebulization 3 (three) times daily as needed. (Patient not taking: No sig reported)    . loperamide (IMODIUM) 2 MG capsule Take 2 mg by mouth 3 (three) times daily as needed for diarrhea or loose  stools.  (Patient not taking: Reported on 12/12/2020)    . Morphine Sulfate (MORPHINE CONCENTRATE) 10 mg / 0.5 ml concentrated solution  (Patient not taking: No sig reported)    . nitroGLYCERIN (NITROSTAT) 0.4 MG SL tablet Place 0.14 mg under the tongue every 5 (five) minutes as needed for chest pain. (Patient not taking: Reported on 12/12/2020)    . promethazine (PHENERGAN) 25 MG/ML injection  (Patient not taking: No sig reported)    . triamcinolone cream (KENALOG) 0.1 % Apply 1 application topically 2 (two) times daily. (Patient not taking: No sig reported)     No current facility-administered medications for this visit.    PHYSICAL EXAMINATION:  ECOG PERFORMANCE STATUS: 2 - Symptomatic, <50% confined to bed  Vitals:   12/12/20 1054  BP: (!) 148/70  Pulse: 62  Resp: 18  Temp: 98.1 F (36.7 C)    There were no vitals filed for this visit. Physical Exam Constitutional:      General: He is not in acute distress.    Appearance: He is not diaphoretic.  HENT:     Head: Normocephalic and atraumatic.     Nose: Nose normal.     Mouth/Throat:     Pharynx: No oropharyngeal exudate.  Eyes:     General: No scleral icterus.       Left eye: No discharge.     Pupils: Pupils are equal, round, and reactive to light.  Neck:     Vascular: No JVD.  Cardiovascular:     Rate and Rhythm: Normal rate and regular rhythm.     Heart sounds: Normal heart sounds. No murmur heard.   Pulmonary:  Effort: Pulmonary effort is normal. No respiratory distress.     Breath sounds: Normal breath sounds. No wheezing or rales.  Chest:     Chest wall: No tenderness.  Abdominal:     General: Bowel sounds are normal. There is no distension.     Palpations: Abdomen is soft. There is no mass.     Tenderness: There is no abdominal tenderness.  Musculoskeletal:        General: No tenderness. Normal range of motion.     Cervical back: Normal range of motion and neck supple.     Comments: left above knee  amputation stump non tender or swelling. No edema at right lower extremity  Lymphadenopathy:     Cervical: No cervical adenopathy.  Skin:    General: Skin is warm and dry.     Findings: No erythema or rash.  Neurological:     Mental Status: He is alert and oriented to person, place, and time.     Cranial Nerves: No cranial nerve deficit.     Motor: No abnormal muscle tone.     Coordination: Coordination normal.       LABORATORY DATA: I have personally reviewed the data as listed:  Appointment on 12/10/2020  Component Date Value Ref Range Status  . Iron 12/10/2020 39* 45 - 182 ug/dL Final  . TIBC 12/10/2020 326  250 - 450 ug/dL Final  . Saturation Ratios 12/10/2020 12* 17.9 - 39.5 % Final  . UIBC 12/10/2020 287  ug/dL Final   Performed at [facility], [address], [place], [place] [address]  . Ferritin 12/10/2020 80  24 - 336 ng/mL Final   Performed at [facility], [address] [place], [place] [address]  . Sodium 12/10/2020 141  135 - 145 mmol/L Final  . Potassium 12/10/2020 4.4  3.5 - 5.1 mmol/L Final  . Chloride 12/10/2020 104  98 - 111 mmol/L Final  . CO2 12/10/2020 27  22 - 32 mmol/L Final  . Glucose, Bld 12/10/2020 119* 70 - 99 mg/dL Final   Glucose reference range applies only to samples taken after fasting for at least 8 hours.  . BUN 12/10/2020 19  8 - 23 mg/dL Final  . Creatinine, Ser 12/10/2020 1.22  0.61 - 1.24 mg/dL Final  . Calcium 12/10/2020 8.8* 8.9 - 10.3 mg/dL Final  . Total Protein 12/10/2020 7.7  6.5 - 8.1 g/dL Final  . Albumin 12/10/2020 3.8  3.5 - 5.0 g/dL Final  . AST 12/10/2020 16  15 - 41 U/L Final  . ALT 12/10/2020 12  0 - 44 U/L Final  . Alkaline Phosphatase 12/10/2020 114  38 - 126 U/L Final  . Total Bilirubin 12/10/2020 0.9  0.3 - 1.2 mg/dL Final  . GFR, Estimated 12/10/2020 >60  >60 mL/min Final   Comment: (NOTE) Calculated using the CKD-EPI Creatinine Equation (2021)   . Anion gap 12/10/2020 10  5 - 15 Final    Performed at [facility], [address] [place], [place] [address]  . WBC 12/10/2020 12.7* 4.0 - 10.5 K/uL Final  . RBC 12/10/2020 5.84* 4.22 - 5.81 MIL/uL Final  . Hemoglobin 12/10/2020 15.4  13.0 - 17.0 g/dL Final  . HCT 12/10/2020 47.8  39.0 - 52.0 % Final  . MCV 12/10/2020 81.8  80.0 - 100.0 fL Final  . MCH 12/10/2020 26.4  26.0 - 34.0 pg Final  . MCHC 12/10/2020 32.2  30.0 - 36.0 g/dL Final  . RDW 12/10/2020 16.7*  11.5 - 15.5 % Final  . Platelets 12/10/2020 218  150 - 400 K/uL Final  . nRBC 12/10/2020 0.0  0.0 - 0.2 % Final  . Neutrophils Relative % 12/10/2020 74  % Final  . Neutro Abs 12/10/2020 9.6* 1.7 - 7.7 K/uL Final  . Lymphocytes Relative 12/10/2020 16  % Final  . Lymphs Abs 12/10/2020 2.0  0.7 - 4.0 K/uL Final  . Monocytes Relative 12/10/2020 7  % Final  . Monocytes Absolute 12/10/2020 0.9  0.1 - 1.0 K/uL Final  . Eosinophils Relative 12/10/2020 1  % Final  . Eosinophils Absolute 12/10/2020 0.1  0.0 - 0.5 K/uL Final  . Basophils Relative 12/10/2020 1  % Final  . Basophils Absolute 12/10/2020 0.1  0.0 - 0.1 K/uL Final  . Immature Granulocytes 12/10/2020 1  % Final  . Abs Immature Granulocytes 12/10/2020 0.06  0.00 - 0.07 K/uL Final   Performed at [facility], [address] [place], [place] [address]    RADIOGRAPHIC STUDIES: I have personally reviewed the radiological images as listed and agree with the findings in the report 04/03/2017 US venous doppler IMPRESSION: Left inguinal/femoral partially thrombosed pseudoaneurysm/hematoma measuring 15 x 5.3 x 6.8 cm.  Reviewed image records from [facility] home 11/26/2016 patient had a venous Doppler extremity left done which showed no occluding acute DVT at the left common femoral vein. 03/03/2017 patient had another venous Doppler extremity left which showed deep vein thromboses involving the proximal to distal femoral vein the common femoral artery now has normal  compression.  04/05/2013 CT chest with contrast    Chronic pulmonary disease with areas of atelectasis and bronchiectasis. Continued significant opacification and bullous change in the left lung especially in the upper lobe and superior segment of the  left lower lobe with other patchy areas bilaterally. There may be mild interval improvement in the area of the lower lingula. Malignancy cannot be excluded in the abnormal areas of the lungs. No central pulmonary arterial filling defect is seen. The bolus is not optimal for evaluation of the lungs  otherwise.    ASSESSMENT/PLAN 1. Iron deficiency anemia due to chronic blood loss   2. Recurrent deep vein thrombosis (DVT) (HCC)   3. Presence of IVC filter    #Iron deficiency anemia. Labs reviewed and discussed with patient. Hemoglobin has normalized. Iron panel showed borderline low iron saturation.  Continue oral ferrous sulfate 325 mg twice daily   #History of recurrent left lower extremity deep vein thrombosis, Continue Eliquis 2.5 mg twice daily.  # Presence of IVC filter, recommend patient to vascular surgery.    Follow up in 6 months.   Earlie Server, MD, PhD Hematology Oncology [place] at [facility]

## 2020-12-13 ENCOUNTER — Other Ambulatory Visit: Payer: Self-pay

## 2020-12-13 DIAGNOSIS — Z95828 Presence of other vascular implants and grafts: Secondary | ICD-10-CM

## 2020-12-14 ENCOUNTER — Other Ambulatory Visit (INDEPENDENT_AMBULATORY_CARE_PROVIDER_SITE_OTHER): Payer: Self-pay | Admitting: Nurse Practitioner

## 2020-12-14 DIAGNOSIS — I739 Peripheral vascular disease, unspecified: Secondary | ICD-10-CM

## 2020-12-17 IMAGING — DX DG CHEST 1V PORT
1 series · 1 of 1 positions shown · non-contrast
Comparison: Chest x-rays dated 09/30/2019 and 09/27/2019.

CLINICAL DATA: Admitted on 09/27/2019 with abscess infection of
LEFT AKA stump. Pulmonary disease.

EXAM:
PORTABLE CHEST 1 VIEW

[chest ap]
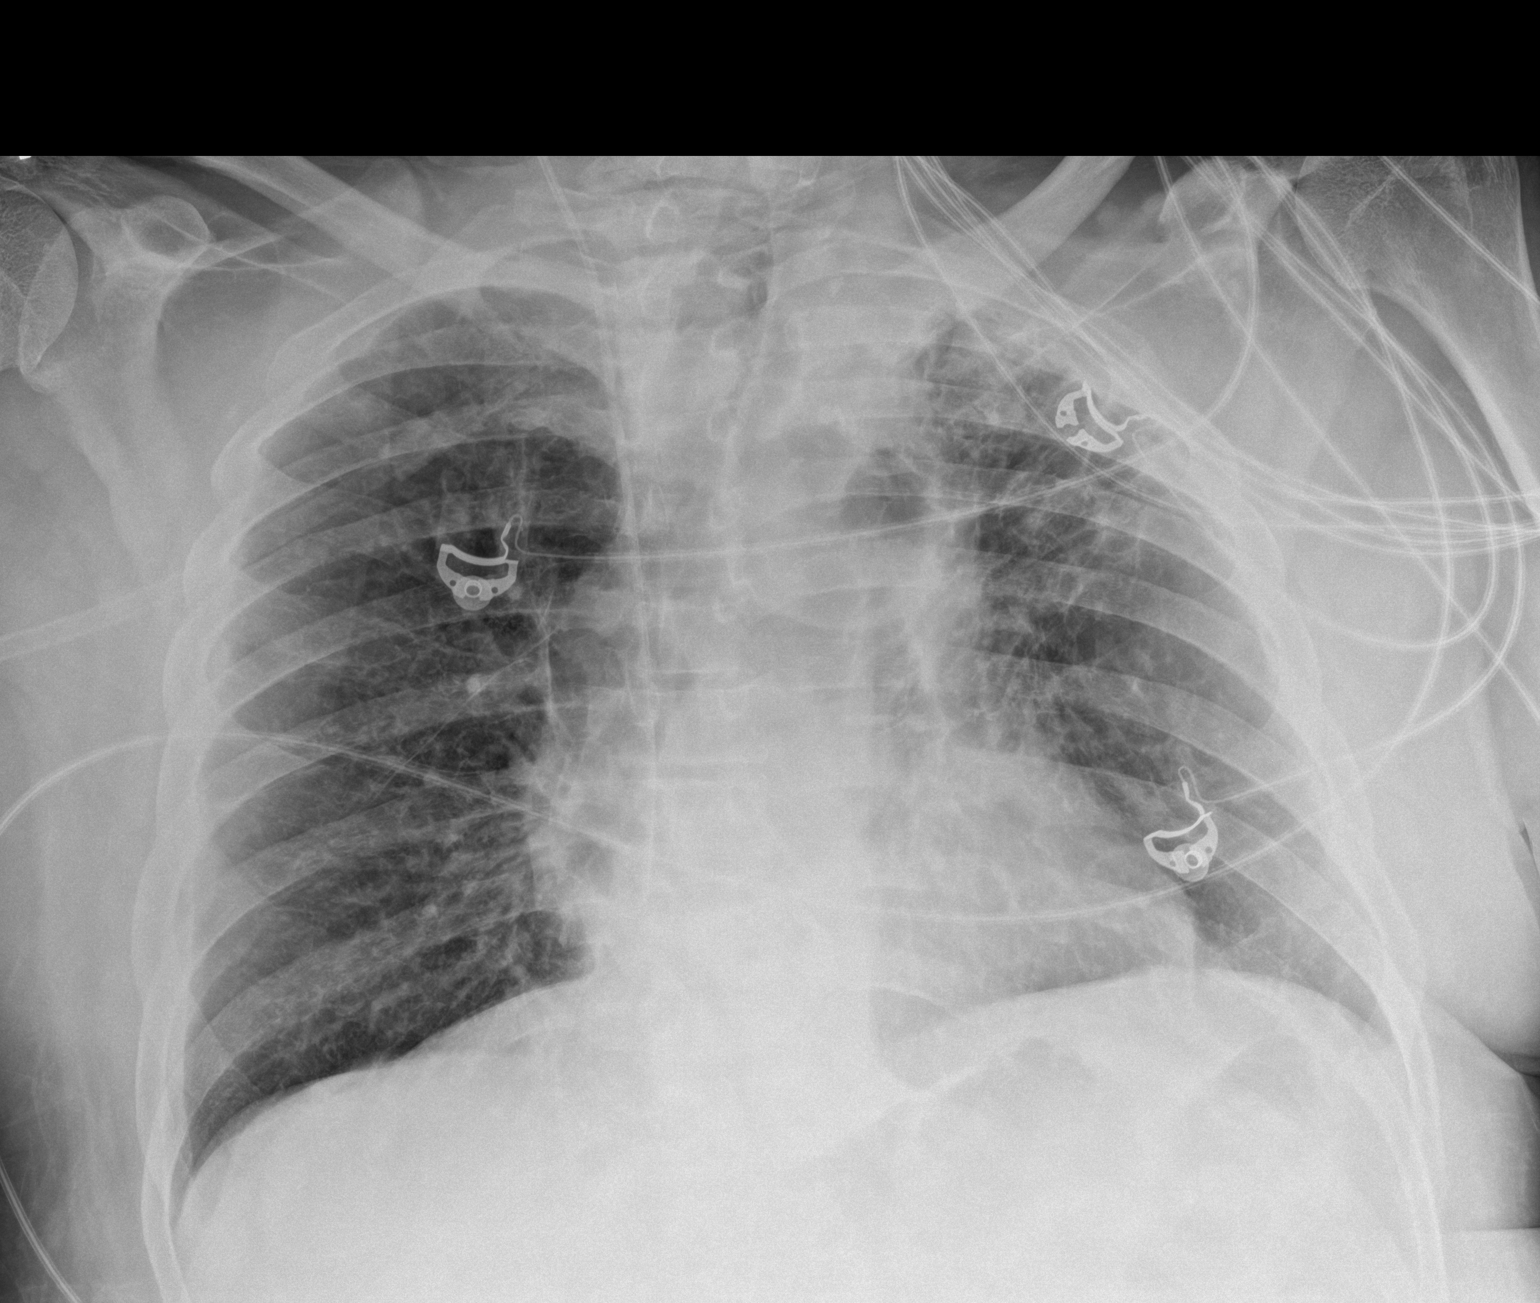

[1 of 1 positions shown; findings below may reference images not displayed]

FINDINGS: Chronic scarring/fibrosis at the LEFT lung apex. Lungs otherwise
clear. No pleural effusion or pneumothorax is seen. Stable mild
cardiomegaly. RIGHT IJ central line is stable in position with tip
at the level of the RIGHT atrium.
IMPRESSION: 1. No active disease. No evidence of pneumonia or pulmonary edema.
2. Stable mild cardiomegaly.

## 2020-12-18 ENCOUNTER — Ambulatory Visit (INDEPENDENT_AMBULATORY_CARE_PROVIDER_SITE_OTHER): Payer: Medicare Other | Admitting: Nurse Practitioner

## 2020-12-18 ENCOUNTER — Ambulatory Visit (INDEPENDENT_AMBULATORY_CARE_PROVIDER_SITE_OTHER): Payer: Medicare Other

## 2020-12-18 ENCOUNTER — Other Ambulatory Visit: Payer: Self-pay

## 2020-12-18 ENCOUNTER — Encounter (INDEPENDENT_AMBULATORY_CARE_PROVIDER_SITE_OTHER): Payer: Self-pay | Admitting: Nurse Practitioner

## 2020-12-18 VITALS — BP 152/73 | HR 70 | Ht 73.0 in | Wt 223.0 lb

## 2020-12-18 DIAGNOSIS — I739 Peripheral vascular disease, unspecified: Secondary | ICD-10-CM | POA: Diagnosis not present

## 2020-12-18 DIAGNOSIS — J449 Chronic obstructive pulmonary disease, unspecified: Secondary | ICD-10-CM | POA: Diagnosis not present

## 2020-12-18 DIAGNOSIS — E785 Hyperlipidemia, unspecified: Secondary | ICD-10-CM | POA: Diagnosis not present

## 2020-12-18 DIAGNOSIS — I10 Essential (primary) hypertension: Secondary | ICD-10-CM | POA: Diagnosis not present

## 2020-12-18 NOTE — Progress Notes (Signed)
Subjective:    Patient ID: Timothy Juarez, male    DOB: 10-Dec-1950, 70 y.o.   MRN: [id number] Chief Complaint  Patient presents with  . Follow-up    6 Mo U/S     The patient returns to the office for followup and review of the noninvasive studies. There have been no interval changes in lower extremity symptoms. No interval shortening of the patient's claudication distance or development of rest pain symptoms. No new ulcers or wounds have occurred since the last visit.  There have been no significant changes to the patient's overall health care.  The patient denies amaurosis fugax or recent TIA symptoms. There are no recent neurological changes noted. The patient denies history of DVT, PE or superficial thrombophlebitis. The patient denies recent episodes of angina or shortness of breath.   ABI Rt=0.69 and Lt=n/a  (previous ABI's Rt=0.58 and Lt=n/a) Duplex ultrasound of the right tibial arteries reveals monophasic waveforms   Review of Systems  Cardiovascular: Negative for leg swelling.  Musculoskeletal: Positive for gait problem.  Skin: Negative for wound.  Neurological: Positive for weakness.  All other systems reviewed and are negative.      Objective:   Physical Exam Vitals reviewed.  HENT:     Head: Normocephalic.  Cardiovascular:     Rate and Rhythm: Normal rate.     Pulses: Decreased pulses.  Pulmonary:     Effort: Pulmonary effort is normal.  Neurological:     Mental Status: He is alert and oriented to person, place, and time.     Gait: Gait abnormal.  Psychiatric:        Mood and Affect: Mood normal.        Behavior: Behavior normal.        Thought Content: Thought content normal.        Judgment: Judgment normal.     BP (!) 152/73   Pulse 70   Ht 6\' 1"  (1.854 m)   Wt 223 lb (101.2 kg)   BMI 29.42 kg/m   Past Medical History:  Diagnosis Date  . Acute embolism and thombos unsp deep vn unsp lower extremity ([place])   . Acute respiratory failure  ([place])   . AKI (acute kidney injury) ([place]) 12/27/2018  . Allergy   . Anemia   . Aneurysm of unspecified site ([place])   . ARF (acute respiratory failure) ([place])    H/O  . Bronchitis   . Cellulitis and abscess of leg 09/27/2019  . CHF (congestive heart failure) ([place])   . COPD (chronic obstructive pulmonary disease) ([place])   . Cough   . Epistaxis   . Gastrointestinal hemorrhage   . GERD (gastroesophageal reflux disease)   . Gout   . Hematemesis 04/27/2020  . High anion gap metabolic acidosis 3/66/4403  . Hyperlipidemia   . Hypertension   . Hypokalemia   . Infection of above knee amputation stump ([place]) 09/27/2019  . Insomnia   . Iron deficiency anemia 04/21/2017  . Muscle contracture    MUSCLE SPASMS, muscle weakness  . Peripheral vascular disease ([place])   . Pneumonia   . Pressure ulcer   . Severe sepsis ([place]) 12/27/2018  . Toxic encephalopathy 12/27/2018    Social History   Socioeconomic History  . Marital status: Legally Separated    Spouse name: Not on file  . Number of children: Not on file  . Years of education: Not on file  . Highest education level: Not on file  Occupational History  .  Not on file  Tobacco Use  . Smoking status: Former Smoker    Packs/day: 1.00    Years: 44.00    Pack years: 44.00    Types: Cigarettes    Quit date: 11/17/2012    Years since quitting: 8.0  . Smokeless tobacco: Never Used  Vaping Use  . Vaping Use: Never used  Substance and Sexual Activity  . Alcohol use: Not Currently  . Drug use: Never  . Sexual activity: Never  Other Topics Concern  . Not on file  Social History Narrative  . Not on file   Social Determinants of Health   Financial Resource Strain: Not on file  Food Insecurity: Not on file  Transportation Needs: Not on file  Physical Activity: Not on file  Stress: Not on file  Social Connections: Not on file  Intimate Partner Violence: Not on file    Past Surgical History:  Procedure Laterality Date  . AMPUTATION Left  09/19/2015   Procedure: AMPUTATION BELOW KNEE;  Surgeon: Algernon Huxley, MD;  Location: [facility];  Service: Vascular;  Laterality: Left;  . AMPUTATION Left 11/01/2015   Procedure: AMPUTATION ABOVE KNEE;  Surgeon: Algernon Huxley, MD;  Location: [facility];  Service: Vascular;  Laterality: Left;  . AMPUTATION Left 09/29/2019   Procedure: IRRIGATION AND DEBRIDEMENT OF LEFT AKA;  Surgeon: Algernon Huxley, MD;  Location: [facility];  Service: General;  Laterality: Left;  . APPLICATION OF WOUND VAC Left 10/18/2015   Procedure: APPLICATION OF WOUND VAC;  Surgeon: Algernon Huxley, MD;  Location: [facility];  Service: Vascular;  Laterality: Left;  . APPLICATION OF WOUND VAC Left 09/30/2019   Procedure: APPLICATION OF WOUND VAC;  Surgeon: Katha Cabal, MD;  Location: [facility];  Service: Vascular;  Laterality: Left;  serial # [id number]  . CARDIAC CATHETERIZATION    . CATARACT EXTRACTION W/PHACO Right 08/18/2019   Procedure: CATARACT EXTRACTION PHACO AND INTRAOCULAR LENS PLACEMENT ([place]) RIGHT;  Surgeon: Birder Robson, MD;  Location: [facility];  Service: Ophthalmology;  Laterality: Right;  [place] 03:29.0 CDE 45.68 Fluid Pack Lot # [id number]  . CENTRAL VENOUS CATHETER INSERTION Right 09/30/2019   Procedure: INSERTION CENTRAL LINE ADULT;  Surgeon: Katha Cabal, MD;  Location: [facility];  Service: Vascular;  Laterality: Right;  . COLONOSCOPY WITH PROPOFOL N/A 05/25/2017   Procedure: COLONOSCOPY WITH PROPOFOL;  Surgeon: Jonathon Bellows, MD;  Location: [facility] ENDOSCOPY;  Service: Gastroenterology;  Laterality: N/A;  . COLONOSCOPY WITH PROPOFOL N/A 10/14/2017   Procedure: COLONOSCOPY WITH PROPOFOL;  Surgeon: Jonathon Bellows, MD;  Location: [facility] ENDOSCOPY;  Service: Gastroenterology;  Laterality: N/A;  . ESOPHAGOGASTRODUODENOSCOPY (EGD) WITH PROPOFOL N/A 05/25/2017   Procedure: ESOPHAGOGASTRODUODENOSCOPY (EGD) WITH PROPOFOL;  Surgeon: Jonathon Bellows, MD;  Location: [facility];  Service: Gastroenterology;  Laterality: N/A;  .  ESOPHAGOGASTRODUODENOSCOPY (EGD) WITH PROPOFOL N/A 10/14/2017   Procedure: ESOPHAGOGASTRODUODENOSCOPY (EGD) WITH PROPOFOL;  Surgeon: Jonathon Bellows, MD;  Location: [facility] ENDOSCOPY;  Service: Gastroenterology;  Laterality: N/A;  . ESOPHAGOGASTRODUODENOSCOPY (EGD) WITH PROPOFOL N/A 04/27/2020   Procedure: ESOPHAGOGASTRODUODENOSCOPY (EGD) WITH PROPOFOL;  Surgeon: Lin Landsman, MD;  Location: [facility] ENDOSCOPY;  Service: Gastroenterology;  Laterality: N/A;  . ESOPHAGOGASTRODUODENOSCOPY (EGD) WITH PROPOFOL N/A 06/18/2020   Procedure: ESOPHAGOGASTRODUODENOSCOPY (EGD) WITH PROPOFOL;  Surgeon: Lin Landsman, MD;  Location: [facility] ENDOSCOPY;  Service: Gastroenterology;  Laterality: N/A;  . EYE SURGERY    . GIVENS CAPSULE STUDY N/A 07/14/2017   Procedure: GIVENS CAPSULE STUDY;  Surgeon: Jonathon Bellows, MD;  Location: [facility] ENDOSCOPY;  Service: Gastroenterology;  Laterality: N/A;  . GIVENS CAPSULE STUDY N/A 10/30/2017   Procedure: GIVENS CAPSULE STUDY 12 HR;  Surgeon: Jonathon Bellows, MD;  Location: [facility] ENDOSCOPY;  Service: Gastroenterology;  Laterality: N/A;  . IVC FILTER INSERTION N/A 10/12/2017   Procedure: IVC FILTER INSERTION;  Surgeon: Algernon Huxley, MD;  Location: [place] CV LAB;  Service: Cardiovascular;  Laterality: N/A;  . LOWER EXTREMITY ANGIOGRAPHY Right 10/04/2018   Procedure: LOWER EXTREMITY ANGIOGRAPHY;  Surgeon: Algernon Huxley, MD;  Location: [place] CV LAB;  Service: Cardiovascular;  Laterality: Right;  . PERIPHERAL VASCULAR CATHETERIZATION Left 01/18/2015   Procedure: Lower Extremity Angiography;  Surgeon: Algernon Huxley, MD;  Location: [place] CV LAB;  Service: Cardiovascular;  Laterality: Left;  . PERIPHERAL VASCULAR CATHETERIZATION N/A 01/18/2015   Procedure: Lower Extremity Intervention;  Surgeon: Algernon Huxley, MD;  Location: [place] [facility];  Service: Cardiovascular;  Laterality: N/A;  . PERIPHERAL VASCULAR CATHETERIZATION  07/30/2015   Procedure: Lower Extremity Intervention;   Surgeon: Algernon Huxley, MD;  Location: [place] CV LAB;  Service: Cardiovascular;;  . PERIPHERAL VASCULAR CATHETERIZATION N/A 07/30/2015   Procedure: Abdominal Aortogram w/Lower Extremity;  Surgeon: Algernon Huxley, MD;  Location: [place] CV LAB;  Service: Cardiovascular;  Laterality: N/A;  . PERIPHERAL VASCULAR CATHETERIZATION Left 08/22/2015   Procedure: Lower Extremity Angiography;  Surgeon: Algernon Huxley, MD;  Location: [place] [facility];  Service: Cardiovascular;  Laterality: Left;  . PERIPHERAL VASCULAR CATHETERIZATION Left 08/22/2015   Procedure: Lower Extremity Intervention;  Surgeon: Algernon Huxley, MD;  Location: [place] CV LAB;  Service: Cardiovascular;  Laterality: Left;  . PERIPHERAL VASCULAR CATHETERIZATION Right 03/31/2016   Procedure: Lower Extremity Angiography;  Surgeon: Algernon Huxley, MD;  Location: [place] [facility];  Service: Cardiovascular;  Laterality: Right;  . PERIPHERAL VASCULAR CATHETERIZATION  03/31/2016   Procedure: Lower Extremity Intervention;  Surgeon: Algernon Huxley, MD;  Location: [place] [facility];  Service: Cardiovascular;;  . PERIPHERAL VASCULAR CATHETERIZATION Left 04/10/2016   Procedure: Lower Extremity Angiography;  Surgeon: Algernon Huxley, MD;  Location: [place] CV LAB;  Service: Cardiovascular;  Laterality: Left;  [name] VACUUM ASSISTED CLOSURE CHANGE Left 10/03/2019   Procedure: LEFT THIGH VACUUM ASSISTED CLOSURE CHANGE;  Surgeon: Algernon Huxley, MD;  Location: [facility];  Service: General;  Laterality: Left;  . WOUND DEBRIDEMENT Left 10/18/2015   Procedure: DEBRIDEMENT WOUND   ( LEFT BKA DEBRIDEMENT );  Surgeon: Algernon Huxley, MD;  Location: [facility];  Service: Vascular;  Laterality: Left;    Family History  Problem Relation Age of Onset  . Heart attack Mother   . Varicose Veins Neg Hx     No Known Allergies  CBC Latest Ref Rng & Units 12/10/2020 05/07/2020 04/29/2020  WBC 4.0 - 10.5 K/uL 12.7(H) 12.1(H) 14.7(H)  Hemoglobin 13.0 - 17.0 g/dL 15.4 11.2(L)  9.6(L)  Hematocrit 39.0 - 52.0 % 47.8 36.6(L) 29.0(L)  Platelets 150 - 400 K/uL 218 342 173      CMP     Component Value Date/Time   NA 141 12/10/2020 1029   NA 136 12/01/2013 1317   K 4.4 12/10/2020 1029   K 3.9 12/01/2013 1317   CL 104 12/10/2020 1029   CL 100 12/01/2013 1317   CO2 27 12/10/2020 1029   CO2 33 (H) 12/01/2013 1317   GLUCOSE 119 (H) 12/10/2020 1029   GLUCOSE 91 12/01/2013 1317   BUN 19 12/10/2020 1029   BUN  24 (H) 09/25/2014 0846   CREATININE 1.22 12/10/2020 1029   CREATININE 1.29 09/25/2014 0846   CALCIUM 8.8 (L) 12/10/2020 1029   CALCIUM 9.4 12/01/2013 1317   PROT 7.7 12/10/2020 1029   PROT 6.5 03/25/2013 0351   ALBUMIN 3.8 12/10/2020 1029   ALBUMIN 1.2 (L) 03/25/2013 0351   AST 16 12/10/2020 1029   AST 45 (H) 03/25/2013 0351   ALT 12 12/10/2020 1029   ALT 35 03/25/2013 0351   ALKPHOS 114 12/10/2020 1029   ALKPHOS 71 03/25/2013 0351   BILITOT 0.9 12/10/2020 1029   BILITOT 0.3 03/25/2013 0351   GFRNONAA >60 12/10/2020 1029   GFRNONAA 60 (L) 09/25/2014 0846   GFRNONAA >60 03/16/2014 0750   GFRAA >60 04/30/2020 0650   GFRAA >60 09/25/2014 0846   GFRAA >60 03/16/2014 0750     No results found.     Assessment & Plan:   1. PAD (peripheral artery disease) (HCC)  Recommend:  The patient has evidence of atherosclerosis of the lower extremities with claudication.  The patient does not voice lifestyle limiting changes at this point in time.  Noninvasive studies do not suggest clinically significant change.  No invasive studies, angiography or surgery at this time The patient should continue walking and begin a more formal exercise program.  The patient should continue antiplatelet therapy and aggressive treatment of the lipid abnormalities  No changes in the patient's medications at this time  The patient should continue wearing graduated compression socks 10-15 mmHg strength to control the mild edema.    2. Essential hypertension Continue  antihypertensive medications as already ordered, these medications have been reviewed and there are no changes at this time.   3. Hyperlipidemia, unspecified hyperlipidemia type Continue statin as ordered and reviewed, no changes at this time   4. COPD with chronic bronchitis ([place]) Continue pulmonary medications and aerosols as already ordered, these medications have been reviewed and there are no changes at this time.     Current Outpatient Medications on File Prior to Visit  Medication Sig Dispense Refill  . acetaminophen (TYLENOL) 325 MG tablet Take 650 mg by mouth every 4 (four) hours as needed for fever.     [name] acidophilus (RISAQUAD) CAPS capsule Take 1 capsule by mouth 2 (two) times daily.    [name] ammonium lactate (AMLACTIN) 12 % cream Apply topically 2 (two) times daily. Apply to right foot    . apixaban (ELIQUIS) 2.5 MG TABS tablet Take 1 tablet (2.5 mg total) by mouth 2 (two) times daily. 60 tablet 3  . ascorbic acid (VITAMIN C) 500 MG tablet Take 500 mg by mouth 2 (two) times daily.    . baclofen (LIORESAL) 10 MG tablet Take 10 mg by mouth 3 (three) times daily. Hold for sedation    . brimonidine-timolol (COMBIGAN) 0.2-0.5 % ophthalmic solution Place 1 drop into the right eye 2 (two) times daily.    . ciclopirox (PENLAC) 8 % solution Apply 1 application topically daily. Apply solutions to toenails    . Difluprednate (DUREZOL) 0.05 % EMUL Place 1 drop into the right eye 3 (three) times daily.    . Difluprednate (DUREZOL) 0.05 % EMUL Apply 1 drop to eye daily.    [name] docusate sodium (COLACE) 100 MG capsule Take 100 mg by mouth daily as needed for mild constipation.    . fentaNYL (DURAGESIC) 25 MCG/HR Place 1 patch onto the skin every 3 (three) days.     . ferrous sulfate 324 (65 Fe) MG TBEC  Take 324 mg by mouth 2 (two) times daily.     . furosemide (LASIX) 40 MG tablet Take 1 tablet (40 mg total) by mouth daily. 30 tablet   . gabapentin (NEURONTIN) 100 MG capsule Take 100 mg by mouth  at bedtime.     [name] guaiFENesin (ROBITUSSIN) 100 MG/5ML SOLN Take 15 mLs by mouth 3 (three) times daily as needed for cough (congestion).    [name] ipratropium-albuterol (DUONEB) 0.5-2.5 (3) MG/3ML SOLN Take by nebulization 3 (three) times daily as needed.    . loperamide (IMODIUM) 2 MG capsule Take 2 mg by mouth 3 (three) times daily as needed for diarrhea or loose stools.    . Melatonin 5 MG TABS Take 5 mg by mouth at bedtime as needed (insomnia).     . Morphine Sulfate (MORPHINE CONCENTRATE) 10 mg / 0.5 ml concentrated solution     . nitroGLYCERIN (NITROSTAT) 0.4 MG SL tablet Place 0.14 mg under the tongue every 5 (five) minutes as needed for chest pain.    . Omega-3 Fatty Acids (FISH OIL) 1000 MG CAPS Take 1,000 mg by mouth daily.     . Oxycodone HCl 10 MG TABS Take 10 mg by mouth every 4 (four) hours as needed (pain).     [name] oxymetazoline (AFRIN) 0.05 % nasal spray Place 2 sprays into both nostrils 2 (two) times daily as needed (For nose bleeds).     . potassium chloride SA (K-DUR,KLOR-CON) 20 MEQ tablet Take 20 mEq by mouth daily. With or after meal    . promethazine (PHENERGAN) 25 MG/ML injection     . sodium chloride (OCEAN) 0.65 % SOLN nasal spray Place 2 sprays into both nostrils 2 (two) times daily as needed for congestion.    . tamsulosin (FLOMAX) 0.4 MG CAPS capsule Take 1 capsule (0.4 mg total) by mouth daily. 30 capsule 0  . TRELEGY ELLIPTA 100-62.5-25 MCG/INH AEPB Inhale 1 puff into the lungs every morning.     . triamcinolone cream (KENALOG) 0.1 % Apply 1 application topically 2 (two) times daily.    [name] atorvastatin (LIPITOR) 10 MG tablet Take 1 tablet (10 mg total) by mouth daily. (Patient taking differently: Take 10 mg by mouth at bedtime.) 30 tablet 11  . pantoprazole (PROTONIX) 40 MG tablet Take 1 tablet (40 mg total) by mouth 2 (two) times daily. 60 tablet 0   No current facility-administered medications on file prior to visit.    There are no Patient Instructions on file for this  visit. No follow-ups on file.   Kris Hartmann, NP

## 2021-03-06 ENCOUNTER — Telehealth (INDEPENDENT_AMBULATORY_CARE_PROVIDER_SITE_OTHER): Payer: Self-pay | Admitting: Vascular Surgery

## 2021-03-06 NOTE — Telephone Encounter (Signed)
Patient can be schedule with ABI see [facility]boy or Arna Medici

## 2021-03-06 NOTE — Telephone Encounter (Signed)
Patient scheduled.

## 2021-03-06 NOTE — Telephone Encounter (Signed)
Called stating that patients' right foot/toe has swelling and a wound on it and one of the providers at the facility would like [place] to take a look at it.   Patient was last seen 12/18/20 with abi studies seen by FB.  Please advise.

## 2021-03-18 ENCOUNTER — Other Ambulatory Visit (INDEPENDENT_AMBULATORY_CARE_PROVIDER_SITE_OTHER): Payer: Self-pay | Admitting: Nurse Practitioner

## 2021-03-18 DIAGNOSIS — S91301S Unspecified open wound, right foot, sequela: Secondary | ICD-10-CM

## 2021-03-18 DIAGNOSIS — Z95828 Presence of other vascular implants and grafts: Secondary | ICD-10-CM

## 2021-03-20 ENCOUNTER — Other Ambulatory Visit: Payer: Self-pay

## 2021-03-20 ENCOUNTER — Ambulatory Visit (INDEPENDENT_AMBULATORY_CARE_PROVIDER_SITE_OTHER): Payer: Medicare Other

## 2021-03-20 ENCOUNTER — Ambulatory Visit (INDEPENDENT_AMBULATORY_CARE_PROVIDER_SITE_OTHER): Payer: Medicare Other | Admitting: Nurse Practitioner

## 2021-03-20 ENCOUNTER — Encounter (INDEPENDENT_AMBULATORY_CARE_PROVIDER_SITE_OTHER): Payer: Self-pay

## 2021-03-20 ENCOUNTER — Encounter (INDEPENDENT_AMBULATORY_CARE_PROVIDER_SITE_OTHER): Payer: Self-pay | Admitting: Nurse Practitioner

## 2021-03-20 VITALS — BP 175/76 | HR 96 | Ht 73.0 in | Wt 200.0 lb

## 2021-03-20 DIAGNOSIS — S91301S Unspecified open wound, right foot, sequela: Secondary | ICD-10-CM | POA: Diagnosis not present

## 2021-03-20 DIAGNOSIS — I998 Other disorder of circulatory system: Secondary | ICD-10-CM

## 2021-03-20 DIAGNOSIS — E785 Hyperlipidemia, unspecified: Secondary | ICD-10-CM | POA: Diagnosis not present

## 2021-03-20 DIAGNOSIS — I10 Essential (primary) hypertension: Secondary | ICD-10-CM

## 2021-03-20 DIAGNOSIS — Z95828 Presence of other vascular implants and grafts: Secondary | ICD-10-CM | POA: Diagnosis not present

## 2021-03-20 NOTE — Progress Notes (Signed)
Subjective:    Patient ID: Timothy Juarez, male    DOB: [birth year], 70 y.o.   MRN: [id number] Chief Complaint  Patient presents with   Follow-up    Add on per phone note rt foot toe wound [place]    Timothy Juarez is a 70 year old male returns to the office for followup and review of the noninvasive studies. There has been a significant deterioration in the lower extremity symptoms.  The patient denies any significant symptoms however he has developed a wound on his second toe on his right foot.  There have been no significant changes to the patient's overall health care.  The patient denies amaurosis fugax or recent TIA symptoms. There are no recent neurological changes noted. The patient denies history of DVT, PE or superficial thrombophlebitis. The patient denies recent episodes of angina or shortness of breath.   Duplex of the right lower extremity shows monophasic waveforms at the distal common femoral artery that immediately becomes blunted at the deep femoral artery.  The proximal SFA, popliteal, and tibial arteries are all occluded.   Review of Systems  Musculoskeletal:  Positive for gait problem.  Neurological:  Positive for weakness.  All other systems reviewed and are negative.     Objective:   Physical Exam Vitals reviewed.  HENT:     Head: Normocephalic.  Cardiovascular:     Rate and Rhythm: Normal rate.     Pulses: Normal pulses.  Pulmonary:     Effort: Pulmonary effort is normal.  Musculoskeletal:     Right lower leg: Edema present.     Left Lower Extremity: Left leg is amputated above knee.  Skin:    General: Skin is cool.     Findings: Wound present.  Neurological:     Mental Status: He is alert and oriented to person, place, and time. Mental status is at baseline.  Psychiatric:        Mood and Affect: Mood normal.        Behavior: Behavior normal.        Thought Content: Thought content normal.        Judgment: Judgment normal.    BP (!) 175/76    Pulse 96   Ht 6\' 1"  (1.854 m)   Wt 200 lb (90.7 kg)   BMI 26.39 kg/m   Past Medical History:  Diagnosis Date   Acute embolism and thombos unsp deep vn unsp lower extremity (HCC)    Acute respiratory failure (HCC)    AKI (acute kidney injury) ([place]) 12/27/2018   Allergy    Anemia    Aneurysm of unspecified site ([place])    ARF (acute respiratory failure) (HCC)    H/O   Bronchitis    Cellulitis and abscess of leg 09/27/2019   CHF (congestive heart failure) (HCC)    COPD (chronic obstructive pulmonary disease) (HCC)    Cough    Epistaxis    Gastrointestinal hemorrhage    GERD (gastroesophageal reflux disease)    Gout    Hematemesis 04/27/2020   High anion gap metabolic acidosis 7/34/2876   Hyperlipidemia    Hypertension    Hypokalemia    Infection of above knee amputation stump ([place]) 09/27/2019   Insomnia    Iron deficiency anemia 04/21/2017   Muscle contracture    MUSCLE SPASMS, muscle weakness   Peripheral vascular disease (HCC)    Pneumonia    Pressure ulcer    Severe sepsis ([place]) 12/27/2018   Toxic encephalopathy 12/27/2018  Social History   Socioeconomic History   Marital status: Legally Separated    Spouse name: Not on file   Number of children: Not on file   Years of education: Not on file   Highest education level: Not on file  Occupational History   Not on file  Tobacco Use   Smoking status: Former    Packs/day: 1.00    Years: 44.00    Pack years: 44.00    Types: Cigarettes    Quit date: 11/17/2012    Years since quitting: 8.3   Smokeless tobacco: Never  Vaping Use   Vaping Use: Never used  Substance and Sexual Activity   Alcohol use: Not Currently   Drug use: Never   Sexual activity: Never  Other Topics Concern   Not on file  Social History Narrative   Not on file   Social Determinants of Health   Financial Resource Strain: Not on file  Food Insecurity: Not on file  Transportation Needs: Not on file  Physical Activity: Not on file  Stress: Not on  file  Social Connections: Not on file  Intimate Partner Violence: Not on file    Past Surgical History:  Procedure Laterality Date   AMPUTATION Left 09/19/2015   Procedure: AMPUTATION BELOW KNEE;  Surgeon: Algernon Huxley, MD;  Location: [facility];  Service: Vascular;  Laterality: Left;   AMPUTATION Left 11/01/2015   Procedure: AMPUTATION ABOVE KNEE;  Surgeon: Algernon Huxley, MD;  Location: [facility];  Service: Vascular;  Laterality: Left;   AMPUTATION Left 09/29/2019   Procedure: IRRIGATION AND DEBRIDEMENT OF LEFT AKA;  Surgeon: Algernon Huxley, MD;  Location: [facility];  Service: General;  Laterality: Left;   APPLICATION OF WOUND VAC Left 10/18/2015   Procedure: APPLICATION OF WOUND VAC;  Surgeon: Algernon Huxley, MD;  Location: [facility];  Service: Vascular;  Laterality: Left;   APPLICATION OF WOUND VAC Left 09/30/2019   Procedure: APPLICATION OF WOUND VAC;  Surgeon: Katha Cabal, MD;  Location: [facility];  Service: Vascular;  Laterality: Left;  serial # [id number]   [id number] CATHETERIZATION     CATARACT EXTRACTION W/PHACO Right 08/18/2019   Procedure: CATARACT EXTRACTION PHACO AND INTRAOCULAR LENS PLACEMENT ([place]) RIGHT;  Surgeon: Birder Robson, MD;  Location: [facility];  Service: Ophthalmology;  Laterality: Right;  [place] 03:29.0 CDE 45.68 Fluid Pack Lot # [id number]   CENTRAL VENOUS CATHETER INSERTION Right 09/30/2019   Procedure: INSERTION CENTRAL LINE ADULT;  Surgeon: Katha Cabal, MD;  Location: [facility];  Service: Vascular;  Laterality: Right;   COLONOSCOPY WITH PROPOFOL N/A 05/25/2017   Procedure: COLONOSCOPY WITH PROPOFOL;  Surgeon: Jonathon Bellows, MD;  Location: [facility] ENDOSCOPY;  Service: Gastroenterology;  Laterality: N/A;   COLONOSCOPY WITH PROPOFOL N/A 10/14/2017   Procedure: COLONOSCOPY WITH PROPOFOL;  Surgeon: Jonathon Bellows, MD;  Location: [facility] ENDOSCOPY;  Service: Gastroenterology;  Laterality: N/A;   ESOPHAGOGASTRODUODENOSCOPY (EGD) WITH PROPOFOL N/A 05/25/2017   Procedure:  ESOPHAGOGASTRODUODENOSCOPY (EGD) WITH PROPOFOL;  Surgeon: Jonathon Bellows, MD;  Location: [facility] ENDOSCOPY;  Service: Gastroenterology;  Laterality: N/A;   ESOPHAGOGASTRODUODENOSCOPY (EGD) WITH PROPOFOL N/A 10/14/2017   Procedure: ESOPHAGOGASTRODUODENOSCOPY (EGD) WITH PROPOFOL;  Surgeon: Jonathon Bellows, MD;  Location: [facility] ENDOSCOPY;  Service: Gastroenterology;  Laterality: N/A;   ESOPHAGOGASTRODUODENOSCOPY (EGD) WITH PROPOFOL N/A 04/27/2020   Procedure: ESOPHAGOGASTRODUODENOSCOPY (EGD) WITH PROPOFOL;  Surgeon: Lin Landsman, MD;  Location: [facility] ENDOSCOPY;  Service: Gastroenterology;  Laterality: N/A;   ESOPHAGOGASTRODUODENOSCOPY (EGD) WITH PROPOFOL N/A 06/18/2020   Procedure: ESOPHAGOGASTRODUODENOSCOPY (EGD)  WITH PROPOFOL;  Surgeon: Lin Landsman, MD;  Location: [facility] ENDOSCOPY;  Service: Gastroenterology;  Laterality: N/A;   EYE SURGERY     GIVENS CAPSULE STUDY N/A 07/14/2017   Procedure: GIVENS CAPSULE STUDY;  Surgeon: Jonathon Bellows, MD;  Location: [facility] ENDOSCOPY;  Service: Gastroenterology;  Laterality: N/A;   GIVENS CAPSULE STUDY N/A 10/30/2017   Procedure: GIVENS CAPSULE STUDY 12 HR;  Surgeon: Jonathon Bellows, MD;  Location: [facility] ENDOSCOPY;  Service: Gastroenterology;  Laterality: N/A;   IVC FILTER INSERTION N/A 10/12/2017   Procedure: IVC FILTER INSERTION;  Surgeon: Algernon Huxley, MD;  Location: [place] CV LAB;  Service: Cardiovascular;  Laterality: N/A;   LOWER EXTREMITY ANGIOGRAPHY Right 10/04/2018   Procedure: LOWER EXTREMITY ANGIOGRAPHY;  Surgeon: Algernon Huxley, MD;  Location: [place] CV LAB;  Service: Cardiovascular;  Laterality: Right;   PERIPHERAL VASCULAR CATHETERIZATION Left 01/18/2015   Procedure: Lower Extremity Angiography;  Surgeon: Algernon Huxley, MD;  Location: [place] CV LAB;  Service: Cardiovascular;  Laterality: Left;   PERIPHERAL VASCULAR CATHETERIZATION N/A 01/18/2015   Procedure: Lower Extremity Intervention;  Surgeon: Algernon Huxley, MD;  Location: [place] CV LAB;   Service: Cardiovascular;  Laterality: N/A;   PERIPHERAL VASCULAR CATHETERIZATION  07/30/2015   Procedure: Lower Extremity Intervention;  Surgeon: Algernon Huxley, MD;  Location: [place] CV LAB;  Service: Cardiovascular;;   PERIPHERAL VASCULAR CATHETERIZATION N/A 07/30/2015   Procedure: Abdominal Aortogram w/Lower Extremity;  Surgeon: Algernon Huxley, MD;  Location: [place] CV LAB;  Service: Cardiovascular;  Laterality: N/A;   PERIPHERAL VASCULAR CATHETERIZATION Left 08/22/2015   Procedure: Lower Extremity Angiography;  Surgeon: Algernon Huxley, MD;  Location: [place] CV LAB;  Service: Cardiovascular;  Laterality: Left;   PERIPHERAL VASCULAR CATHETERIZATION Left 08/22/2015   Procedure: Lower Extremity Intervention;  Surgeon: Algernon Huxley, MD;  Location: [place] CV LAB;  Service: Cardiovascular;  Laterality: Left;   PERIPHERAL VASCULAR CATHETERIZATION Right 03/31/2016   Procedure: Lower Extremity Angiography;  Surgeon: Algernon Huxley, MD;  Location: [place] CV LAB;  Service: Cardiovascular;  Laterality: Right;   PERIPHERAL VASCULAR CATHETERIZATION  03/31/2016   Procedure: Lower Extremity Intervention;  Surgeon: Algernon Huxley, MD;  Location: [place] CV LAB;  Service: Cardiovascular;;   PERIPHERAL VASCULAR CATHETERIZATION Left 04/10/2016   Procedure: Lower Extremity Angiography;  Surgeon: Algernon Huxley, MD;  Location: [place] CV LAB;  Service: Cardiovascular;  Laterality: Left;   VACUUM ASSISTED CLOSURE CHANGE Left 10/03/2019   Procedure: LEFT THIGH VACUUM ASSISTED CLOSURE CHANGE;  Surgeon: Algernon Huxley, MD;  Location: [facility];  Service: General;  Laterality: Left;   WOUND DEBRIDEMENT Left 10/18/2015   Procedure: DEBRIDEMENT WOUND   ( LEFT BKA DEBRIDEMENT );  Surgeon: Algernon Huxley, MD;  Location: [facility];  Service: Vascular;  Laterality: Left;    Family History  Problem Relation Age of Onset   Heart attack Mother    Varicose Veins Neg Hx     No Known Allergies  CBC Latest Ref  Rng & Units 12/10/2020 05/07/2020 04/29/2020  WBC 4.0 - 10.5 K/uL 12.7(H) 12.1(H) 14.7(H)  Hemoglobin 13.0 - 17.0 g/dL 15.4 11.2(L) 9.6(L)  Hematocrit 39.0 - 52.0 % 47.8 36.6(L) 29.0(L)  Platelets 150 - 400 K/uL 218 342 173      CMP     Component Value Date/Time   NA 141 12/10/2020 1029   NA 136 12/01/2013 1317   K 4.4 12/10/2020 1029   K 3.9 12/01/2013 1317  CL 104 12/10/2020 1029   CL 100 12/01/2013 1317   CO2 27 12/10/2020 1029   CO2 33 (H) 12/01/2013 1317   GLUCOSE 119 (H) 12/10/2020 1029   GLUCOSE 91 12/01/2013 1317   BUN 19 12/10/2020 1029   BUN 24 (H) 09/25/2014 0846   CREATININE 1.22 12/10/2020 1029   CREATININE 1.29 09/25/2014 0846   CALCIUM 8.8 (L) 12/10/2020 1029   CALCIUM 9.4 12/01/2013 1317   PROT 7.7 12/10/2020 1029   PROT 6.5 03/25/2013 0351   ALBUMIN 3.8 12/10/2020 1029   ALBUMIN 1.2 (L) 03/25/2013 0351   AST 16 12/10/2020 1029   AST 45 (H) 03/25/2013 0351   ALT 12 12/10/2020 1029   ALT 35 03/25/2013 0351   ALKPHOS 114 12/10/2020 1029   ALKPHOS 71 03/25/2013 0351   BILITOT 0.9 12/10/2020 1029   BILITOT 0.3 03/25/2013 0351   GFRNONAA >60 12/10/2020 1029   GFRNONAA 60 (L) 09/25/2014 0846   GFRNONAA >60 03/16/2014 0750   GFRAA >60 04/30/2020 0650   GFRAA >60 09/25/2014 0846   GFRAA >60 03/16/2014 0750     No results found.     Assessment & Plan:   1. Open wound of right foot, sequela The wound is relatively superficial.  Once the patient is able to have restoration of blood flow this wound should heal likely heal without significant issue.  Aquacel placed over wound with dry dressing.  2. Essential hypertension Continue antihypertensive medications as already ordered, these medications have been reviewed and there are no changes at this time.   3. Hyperlipidemia, unspecified hyperlipidemia type Continue statin as ordered and reviewed, no changes at this time   4. Ischemia of lower extremity  Recommend:  The patient has evidence of severe  atherosclerotic changes of both lower extremities associated with ulceration and tissue loss of the foot.  This represents a limb threatening ischemia and places the patient at the risk for limb loss.  Patient should undergo angiography of the lower extremities with the hope for intervention for limb salvage.  The risks and benefits as well as the alternative therapies was discussed in detail with the patient.  All questions were answered.  Patient agrees to proceed with angiography.  The patient will follow up with me in the office after the procedure.     Current Outpatient Medications on File Prior to Visit  Medication Sig Dispense Refill   acetaminophen (TYLENOL) 325 MG tablet Take 650 mg by mouth every 4 (four) hours as needed for fever.      acidophilus (RISAQUAD) CAPS capsule Take 1 capsule by mouth 2 (two) times daily.     ammonium lactate (AMLACTIN) 12 % cream Apply topically 2 (two) times daily. Apply to right foot     apixaban (ELIQUIS) 2.5 MG TABS tablet Take 1 tablet (2.5 mg total) by mouth 2 (two) times daily. 60 tablet 3   ascorbic acid (VITAMIN C) 500 MG tablet Take 500 mg by mouth 2 (two) times daily.     baclofen (LIORESAL) 10 MG tablet Take 10 mg by mouth 3 (three) times daily. Hold for sedation     brimonidine-timolol (COMBIGAN) 0.2-0.5 % ophthalmic solution Place 1 drop into the right eye 2 (two) times daily.     ciclopirox (PENLAC) 8 % solution Apply 1 application topically daily. Apply solutions to toenails     Difluprednate (DUREZOL) 0.05 % EMUL Place 1 drop into the right eye 3 (three) times daily.     Difluprednate (DUREZOL) 0.05 % EMUL  Apply 1 drop to eye daily.     docusate sodium (COLACE) 100 MG capsule Take 100 mg by mouth daily as needed for mild constipation.     fentaNYL (DURAGESIC) 25 MCG/HR Place 1 patch onto the skin every 3 (three) days.      ferrous sulfate 324 (65 Fe) MG TBEC Take 324 mg by mouth 2 (two) times daily.      furosemide (LASIX) 40 MG tablet  Take 1 tablet (40 mg total) by mouth daily. 30 tablet    gabapentin (NEURONTIN) 100 MG capsule Take 100 mg by mouth at bedtime.      guaiFENesin (ROBITUSSIN) 100 MG/5ML SOLN Take 15 mLs by mouth 3 (three) times daily as needed for cough (congestion).     ipratropium-albuterol (DUONEB) 0.5-2.5 (3) MG/3ML SOLN Take by nebulization 3 (three) times daily as needed.     loperamide (IMODIUM) 2 MG capsule Take 2 mg by mouth 3 (three) times daily as needed for diarrhea or loose stools.     Melatonin 5 MG TABS Take 5 mg by mouth at bedtime as needed (insomnia).      Morphine Sulfate (MORPHINE CONCENTRATE) 10 mg / 0.5 ml concentrated solution      nitroGLYCERIN (NITROSTAT) 0.4 MG SL tablet Place 0.14 mg under the tongue every 5 (five) minutes as needed for chest pain.     Omega-3 Fatty Acids (FISH OIL) 1000 MG CAPS Take 1,000 mg by mouth daily.      Oxycodone HCl 10 MG TABS Take 10 mg by mouth every 4 (four) hours as needed (pain).      oxymetazoline (AFRIN) 0.05 % nasal spray Place 2 sprays into both nostrils 2 (two) times daily as needed (For nose bleeds).      potassium chloride SA (K-DUR,KLOR-CON) 20 MEQ tablet Take 20 mEq by mouth daily. With or after meal     promethazine (PHENERGAN) 25 MG/ML injection      sodium chloride (OCEAN) 0.65 % SOLN nasal spray Place 2 sprays into both nostrils 2 (two) times daily as needed for congestion.     tamsulosin (FLOMAX) 0.4 MG CAPS capsule Take 1 capsule (0.4 mg total) by mouth daily. 30 capsule 0   TRELEGY ELLIPTA 100-62.5-25 MCG/INH AEPB Inhale 1 puff into the lungs every morning.      triamcinolone cream (KENALOG) 0.1 % Apply 1 application topically 2 (two) times daily.     atorvastatin (LIPITOR) 10 MG tablet Take 1 tablet (10 mg total) by mouth daily. (Patient taking differently: Take 10 mg by mouth at bedtime.) 30 tablet 11   ketorolac (ACULAR) 0.5 % ophthalmic solution Place 1 drop into the left eye 2 (two) times daily.     pantoprazole (PROTONIX) 40 MG  tablet Take 1 tablet (40 mg total) by mouth 2 (two) times daily. 60 tablet 0   No current facility-administered medications on file prior to visit.    There are no Patient Instructions on file for this visit. No follow-ups on file.   Kris Hartmann, NP

## 2021-03-20 NOTE — H&P (View-Only) (Signed)
Subjective:    Patient ID: Timothy Juarez, male    DOB: July 02, 1951, 70 y.o.   MRN: [id number] Chief Complaint  Patient presents with   Follow-up    Add on per phone note rt foot toe wound [place]    Timothy Juarez is a 70 year old male returns to the office for followup and review of the noninvasive studies. There has been a significant deterioration in the lower extremity symptoms.  The patient denies any significant symptoms however he has developed a wound on his second toe on his right foot.  There have been no significant changes to the patient's overall health care.  The patient denies amaurosis fugax or recent TIA symptoms. There are no recent neurological changes noted. The patient denies history of DVT, PE or superficial thrombophlebitis. The patient denies recent episodes of angina or shortness of breath.   Duplex of the right lower extremity shows monophasic waveforms at the distal common femoral artery that immediately becomes blunted at the deep femoral artery.  The proximal SFA, popliteal, and tibial arteries are all occluded.   Review of Systems  Musculoskeletal:  Positive for gait problem.  Neurological:  Positive for weakness.  All other systems reviewed and are negative.     Objective:   Physical Exam Vitals reviewed.  HENT:     Head: Normocephalic.  Cardiovascular:     Rate and Rhythm: Normal rate.     Pulses: Normal pulses.  Pulmonary:     Effort: Pulmonary effort is normal.  Musculoskeletal:     Right lower leg: Edema present.     Left Lower Extremity: Left leg is amputated above knee.  Skin:    General: Skin is cool.     Findings: Wound present.  Neurological:     Mental Status: He is alert and oriented to person, place, and time. Mental status is at baseline.  Psychiatric:        Mood and Affect: Mood normal.        Behavior: Behavior normal.        Thought Content: Thought content normal.        Judgment: Judgment normal.    BP (!) 175/76    Pulse 96   Ht 6\' 1"  (1.854 m)   Wt 200 lb (90.7 kg)   BMI 26.39 kg/m   Past Medical History:  Diagnosis Date   Acute embolism and thombos unsp deep vn unsp lower extremity (HCC)    Acute respiratory failure (HCC)    AKI (acute kidney injury) ([place]) 12/27/2018   Allergy    Anemia    Aneurysm of unspecified site ([place])    ARF (acute respiratory failure) (HCC)    H/O   Bronchitis    Cellulitis and abscess of leg 09/27/2019   CHF (congestive heart failure) (HCC)    COPD (chronic obstructive pulmonary disease) (HCC)    Cough    Epistaxis    Gastrointestinal hemorrhage    GERD (gastroesophageal reflux disease)    Gout    Hematemesis 04/27/2020   High anion gap metabolic acidosis 1/82/9937   Hyperlipidemia    Hypertension    Hypokalemia    Infection of above knee amputation stump ([place]) 09/27/2019   Insomnia    Iron deficiency anemia 04/21/2017   Muscle contracture    MUSCLE SPASMS, muscle weakness   Peripheral vascular disease (HCC)    Pneumonia    Pressure ulcer    Severe sepsis ([place]) 12/27/2018   Toxic encephalopathy 12/27/2018  Social History   Socioeconomic History   Marital status: Legally Separated    Spouse name: Not on file   Number of children: Not on file   Years of education: Not on file   Highest education level: Not on file  Occupational History   Not on file  Tobacco Use   Smoking status: Former    Packs/day: 1.00    Years: 44.00    Pack years: 44.00    Types: Cigarettes    Quit date: 11/17/2012    Years since quitting: 8.3   Smokeless tobacco: Never  Vaping Use   Vaping Use: Never used  Substance and Sexual Activity   Alcohol use: Not Currently   Drug use: Never   Sexual activity: Never  Other Topics Concern   Not on file  Social History Narrative   Not on file   Social Determinants of Health   Financial Resource Strain: Not on file  Food Insecurity: Not on file  Transportation Needs: Not on file  Physical Activity: Not on file  Stress: Not on  file  Social Connections: Not on file  Intimate Partner Violence: Not on file    Past Surgical History:  Procedure Laterality Date   AMPUTATION Left 09/19/2015   Procedure: AMPUTATION BELOW KNEE;  Surgeon: Algernon Huxley, MD;  Location: [facility];  Service: Vascular;  Laterality: Left;   AMPUTATION Left 11/01/2015   Procedure: AMPUTATION ABOVE KNEE;  Surgeon: Algernon Huxley, MD;  Location: [facility];  Service: Vascular;  Laterality: Left;   AMPUTATION Left 09/29/2019   Procedure: IRRIGATION AND DEBRIDEMENT OF LEFT AKA;  Surgeon: Algernon Huxley, MD;  Location: [facility];  Service: General;  Laterality: Left;   APPLICATION OF WOUND VAC Left 10/18/2015   Procedure: APPLICATION OF WOUND VAC;  Surgeon: Algernon Huxley, MD;  Location: [facility];  Service: Vascular;  Laterality: Left;   APPLICATION OF WOUND VAC Left 09/30/2019   Procedure: APPLICATION OF WOUND VAC;  Surgeon: Katha Cabal, MD;  Location: [facility];  Service: Vascular;  Laterality: Left;  serial # [id number]   [id number] CATHETERIZATION     CATARACT EXTRACTION W/PHACO Right 08/18/2019   Procedure: CATARACT EXTRACTION PHACO AND INTRAOCULAR LENS PLACEMENT ([place]) RIGHT;  Surgeon: Birder Robson, MD;  Location: [facility];  Service: Ophthalmology;  Laterality: Right;  [place] 03:29.0 CDE 45.68 Fluid Pack Lot # [id number]   CENTRAL VENOUS CATHETER INSERTION Right 09/30/2019   Procedure: INSERTION CENTRAL LINE ADULT;  Surgeon: Katha Cabal, MD;  Location: [facility];  Service: Vascular;  Laterality: Right;   COLONOSCOPY WITH PROPOFOL N/A 05/25/2017   Procedure: COLONOSCOPY WITH PROPOFOL;  Surgeon: Jonathon Bellows, MD;  Location: [facility] ENDOSCOPY;  Service: Gastroenterology;  Laterality: N/A;   COLONOSCOPY WITH PROPOFOL N/A 10/14/2017   Procedure: COLONOSCOPY WITH PROPOFOL;  Surgeon: Jonathon Bellows, MD;  Location: [facility] ENDOSCOPY;  Service: Gastroenterology;  Laterality: N/A;   ESOPHAGOGASTRODUODENOSCOPY (EGD) WITH PROPOFOL N/A 05/25/2017   Procedure:  ESOPHAGOGASTRODUODENOSCOPY (EGD) WITH PROPOFOL;  Surgeon: Jonathon Bellows, MD;  Location: [facility]OSCOPY;  Service: Gastroenterology;  Laterality: N/A;   ESOPHAGOGASTRODUODENOSCOPY (EGD) WITH PROPOFOL N/A 10/14/2017   Procedure: ESOPHAGOGASTRODUODENOSCOPY (EGD) WITH PROPOFOL;  Surgeon: Jonathon Bellows, MD;  Location: [facility] ENDOSCOPY;  Service: Gastroenterology;  Laterality: N/A;   ESOPHAGOGASTRODUODENOSCOPY (EGD) WITH PROPOFOL N/A 04/27/2020   Procedure: ESOPHAGOGASTRODUODENOSCOPY (EGD) WITH PROPOFOL;  Surgeon: Lin Landsman, MD;  Location: [facility] ENDOSCOPY;  Service: Gastroenterology;  Laterality: N/A;   ESOPHAGOGASTRODUODENOSCOPY (EGD) WITH PROPOFOL N/A 06/18/2020   Procedure: ESOPHAGOGASTRODUODENOSCOPY (EGD)  WITH PROPOFOL;  Surgeon: Lin Landsman, MD;  Location: [facility] ENDOSCOPY;  Service: Gastroenterology;  Laterality: N/A;   EYE SURGERY     GIVENS CAPSULE STUDY N/A 07/14/2017   Procedure: GIVENS CAPSULE STUDY;  Surgeon: Jonathon Bellows, MD;  Location: [facility] ENDOSCOPY;  Service: Gastroenterology;  Laterality: N/A;   GIVENS CAPSULE STUDY N/A 10/30/2017   Procedure: GIVENS CAPSULE STUDY 12 HR;  Surgeon: Jonathon Bellows, MD;  Location: [facility] ENDOSCOPY;  Service: Gastroenterology;  Laterality: N/A;   IVC FILTER INSERTION N/A 10/12/2017   Procedure: IVC FILTER INSERTION;  Surgeon: Algernon Huxley, MD;  Location: [place] [facility];  Service: Cardiovascular;  Laterality: N/A;   LOWER EXTREMITY ANGIOGRAPHY Right 10/04/2018   Procedure: LOWER EXTREMITY ANGIOGRAPHY;  Surgeon: Algernon Huxley, MD;  Location: [place] [facility];  Service: Cardiovascular;  Laterality: Right;   PERIPHERAL VASCULAR CATHETERIZATION Left 01/18/2015   Procedure: Lower Extremity Angiography;  Surgeon: Algernon Huxley, MD;  Location: [place] [facility];  Service: Cardiovascular;  Laterality: Left;   PERIPHERAL VASCULAR CATHETERIZATION N/A 01/18/2015   Procedure: Lower Extremity Intervention;  Surgeon: Algernon Huxley, MD;  Location: [place] [facility];   Service: Cardiovascular;  Laterality: N/A;   PERIPHERAL VASCULAR CATHETERIZATION  07/30/2015   Procedure: Lower Extremity Intervention;  Surgeon: Algernon Huxley, MD;  Location: [place] [facility];  Service: Cardiovascular;;   PERIPHERAL VASCULAR CATHETERIZATION N/A 07/30/2015   Procedure: Abdominal Aortogram w/Lower Extremity;  Surgeon: Algernon Huxley, MD;  Location: [place] [facility];  Service: Cardiovascular;  Laterality: N/A;   PERIPHERAL VASCULAR CATHETERIZATION Left 08/22/2015   Procedure: Lower Extremity Angiography;  Surgeon: Algernon Huxley, MD;  Location: [place] [facility];  Service: Cardiovascular;  Laterality: Left;   PERIPHERAL VASCULAR CATHETERIZATION Left 08/22/2015   Procedure: Lower Extremity Intervention;  Surgeon: Algernon Huxley, MD;  Location: [place] [facility];  Service: Cardiovascular;  Laterality: Left;   PERIPHERAL VASCULAR CATHETERIZATION Right 03/31/2016   Procedure: Lower Extremity Angiography;  Surgeon: Algernon Huxley, MD;  Location: [place] [facility];  Service: Cardiovascular;  Laterality: Right;   PERIPHERAL VASCULAR CATHETERIZATION  03/31/2016   Procedure: Lower Extremity Intervention;  Surgeon: Algernon Huxley, MD;  Location: [place] [facility];  Service: Cardiovascular;;   PERIPHERAL VASCULAR CATHETERIZATION Left 04/10/2016   Procedure: Lower Extremity Angiography;  Surgeon: Algernon Huxley, MD;  Location: [place] [facility];  Service: Cardiovascular;  Laterality: Left;   VACUUM ASSISTED CLOSURE CHANGE Left 10/03/2019   Procedure: LEFT THIGH VACUUM ASSISTED CLOSURE CHANGE;  Surgeon: Algernon Huxley, MD;  Location: [facility];  Service: General;  Laterality: Left;   WOUND DEBRIDEMENT Left 10/18/2015   Procedure: DEBRIDEMENT WOUND   ( LEFT BKA DEBRIDEMENT );  Surgeon: Algernon Huxley, MD;  Location: [facility];  Service: Vascular;  Laterality: Left;    Family History  Problem Relation Age of Onset   Heart attack Mother    Varicose Veins Neg Hx     No Known Allergies  CBC Latest Ref  Rng & Units 12/10/2020 05/07/2020 04/29/2020  WBC 4.0 - 10.5 K/uL 12.7(H) 12.1(H) 14.7(H)  Hemoglobin 13.0 - 17.0 g/dL 15.4 11.2(L) 9.6(L)  Hematocrit 39.0 - 52.0 % 47.8 36.6(L) 29.0(L)  Platelets 150 - 400 K/uL 218 342 173      CMP     Component Value Date/Time   NA 141 12/10/2020 1029   NA 136 12/01/2013 1317   K 4.4 12/10/2020 1029   K 3.9 12/01/2013 1317  CL 104 12/10/2020 1029   CL 100 12/01/2013 1317   CO2 27 12/10/2020 1029   CO2 33 (H) 12/01/2013 1317   GLUCOSE 119 (H) 12/10/2020 1029   GLUCOSE 91 12/01/2013 1317   BUN 19 12/10/2020 1029   BUN 24 (H) 09/25/2014 0846   CREATININE 1.22 12/10/2020 1029   CREATININE 1.29 09/25/2014 0846   CALCIUM 8.8 (L) 12/10/2020 1029   CALCIUM 9.4 12/01/2013 1317   PROT 7.7 12/10/2020 1029   PROT 6.5 03/25/2013 0351   ALBUMIN 3.8 12/10/2020 1029   ALBUMIN 1.2 (L) 03/25/2013 0351   AST 16 12/10/2020 1029   AST 45 (H) 03/25/2013 0351   ALT 12 12/10/2020 1029   ALT 35 03/25/2013 0351   ALKPHOS 114 12/10/2020 1029   ALKPHOS 71 03/25/2013 0351   BILITOT 0.9 12/10/2020 1029   BILITOT 0.3 03/25/2013 0351   GFRNONAA >60 12/10/2020 1029   GFRNONAA 60 (L) 09/25/2014 0846   GFRNONAA >60 03/16/2014 0750   GFRAA >60 04/30/2020 0650   GFRAA >60 09/25/2014 0846   GFRAA >60 03/16/2014 0750     No results found.     Assessment & Plan:   1. Open wound of right foot, sequela The wound is relatively superficial.  Once the patient is able to have restoration of blood flow this wound should heal likely heal without significant issue.  Aquacel placed over wound with dry dressing.  2. Essential hypertension Continue antihypertensive medications as already ordered, these medications have been reviewed and there are no changes at this time.   3. Hyperlipidemia, unspecified hyperlipidemia type Continue statin as ordered and reviewed, no changes at this time   4. Ischemia of lower extremity  Recommend:  The patient has evidence of severe  atherosclerotic changes of both lower extremities associated with ulceration and tissue loss of the foot.  This represents a limb threatening ischemia and places the patient at the risk for limb loss.  Patient should undergo angiography of the lower extremities with the hope for intervention for limb salvage.  The risks and benefits as well as the alternative therapies was discussed in detail with the patient.  All questions were answered.  Patient agrees to proceed with angiography.  The patient will follow up with me in the office after the procedure.     Current Outpatient Medications on File Prior to Visit  Medication Sig Dispense Refill   acetaminophen (TYLENOL) 325 MG tablet Take 650 mg by mouth every 4 (four) hours as needed for fever.      acidophilus (RISAQUAD) CAPS capsule Take 1 capsule by mouth 2 (two) times daily.     ammonium lactate (AMLACTIN) 12 % cream Apply topically 2 (two) times daily. Apply to right foot     apixaban (ELIQUIS) 2.5 MG TABS tablet Take 1 tablet (2.5 mg total) by mouth 2 (two) times daily. 60 tablet 3   ascorbic acid (VITAMIN C) 500 MG tablet Take 500 mg by mouth 2 (two) times daily.     baclofen (LIORESAL) 10 MG tablet Take 10 mg by mouth 3 (three) times daily. Hold for sedation     brimonidine-timolol (COMBIGAN) 0.2-0.5 % ophthalmic solution Place 1 drop into the right eye 2 (two) times daily.     ciclopirox (PENLAC) 8 % solution Apply 1 application topically daily. Apply solutions to toenails     Difluprednate (DUREZOL) 0.05 % EMUL Place 1 drop into the right eye 3 (three) times daily.     Difluprednate (DUREZOL) 0.05 % EMUL  Apply 1 drop to eye daily.     docusate sodium (COLACE) 100 MG capsule Take 100 mg by mouth daily as needed for mild constipation.     fentaNYL (DURAGESIC) 25 MCG/HR Place 1 patch onto the skin every 3 (three) days.      ferrous sulfate 324 (65 Fe) MG TBEC Take 324 mg by mouth 2 (two) times daily.      furosemide (LASIX) 40 MG tablet  Take 1 tablet (40 mg total) by mouth daily. 30 tablet    gabapentin (NEURONTIN) 100 MG capsule Take 100 mg by mouth at bedtime.      guaiFENesin (ROBITUSSIN) 100 MG/5ML SOLN Take 15 mLs by mouth 3 (three) times daily as needed for cough (congestion).     ipratropium-albuterol (DUONEB) 0.5-2.5 (3) MG/3ML SOLN Take by nebulization 3 (three) times daily as needed.     loperamide (IMODIUM) 2 MG capsule Take 2 mg by mouth 3 (three) times daily as needed for diarrhea or loose stools.     Melatonin 5 MG TABS Take 5 mg by mouth at bedtime as needed (insomnia).      Morphine Sulfate (MORPHINE CONCENTRATE) 10 mg / 0.5 ml concentrated solution      nitroGLYCERIN (NITROSTAT) 0.4 MG SL tablet Place 0.14 mg under the tongue every 5 (five) minutes as needed for chest pain.     Omega-3 Fatty Acids (FISH OIL) 1000 MG CAPS Take 1,000 mg by mouth daily.      Oxycodone HCl 10 MG TABS Take 10 mg by mouth every 4 (four) hours as needed (pain).      oxymetazoline (AFRIN) 0.05 % nasal spray Place 2 sprays into both nostrils 2 (two) times daily as needed (For nose bleeds).      potassium chloride SA (K-DUR,KLOR-CON) 20 MEQ tablet Take 20 mEq by mouth daily. With or after meal     promethazine (PHENERGAN) 25 MG/ML injection      sodium chloride (OCEAN) 0.65 % SOLN nasal spray Place 2 sprays into both nostrils 2 (two) times daily as needed for congestion.     tamsulosin (FLOMAX) 0.4 MG CAPS capsule Take 1 capsule (0.4 mg total) by mouth daily. 30 capsule 0   TRELEGY ELLIPTA 100-62.5-25 MCG/INH AEPB Inhale 1 puff into the lungs every morning.      triamcinolone cream (KENALOG) 0.1 % Apply 1 application topically 2 (two) times daily.     atorvastatin (LIPITOR) 10 MG tablet Take 1 tablet (10 mg total) by mouth daily. (Patient taking differently: Take 10 mg by mouth at bedtime.) 30 tablet 11   ketorolac (ACULAR) 0.5 % ophthalmic solution Place 1 drop into the left eye 2 (two) times daily.     pantoprazole (PROTONIX) 40 MG  tablet Take 1 tablet (40 mg total) by mouth 2 (two) times daily. 60 tablet 0   No current facility-administered medications on file prior to visit.    There are no Patient Instructions on file for this visit. No follow-ups on file.   Kris Hartmann, NP

## 2021-03-21 ENCOUNTER — Other Ambulatory Visit (INDEPENDENT_AMBULATORY_CARE_PROVIDER_SITE_OTHER): Payer: Self-pay | Admitting: Nurse Practitioner

## 2021-03-21 ENCOUNTER — Encounter: Payer: Self-pay | Admitting: Vascular Surgery

## 2021-03-21 ENCOUNTER — Encounter
Admission: RE | Disposition: A | Discharge status: Home / Self Care | Payer: Self-pay | Source: Home / Self Care | Attending: Vascular Surgery

## 2021-03-21 ENCOUNTER — Observation Stay
Admission: RE | Admit: 2021-03-21 | Discharge: 2021-03-22 | Disposition: A | Discharge status: Home / Self Care | Payer: Medicare Other | Attending: Vascular Surgery | Admitting: Vascular Surgery

## 2021-03-21 DIAGNOSIS — E785 Hyperlipidemia, unspecified: Secondary | ICD-10-CM | POA: Insufficient documentation

## 2021-03-21 DIAGNOSIS — J449 Chronic obstructive pulmonary disease, unspecified: Secondary | ICD-10-CM | POA: Insufficient documentation

## 2021-03-21 DIAGNOSIS — Z87891 Personal history of nicotine dependence: Secondary | ICD-10-CM | POA: Diagnosis not present

## 2021-03-21 DIAGNOSIS — I998 Other disorder of circulatory system: Secondary | ICD-10-CM | POA: Diagnosis not present

## 2021-03-21 DIAGNOSIS — N179 Acute kidney failure, unspecified: Secondary | ICD-10-CM | POA: Diagnosis not present

## 2021-03-21 DIAGNOSIS — I70221 Atherosclerosis of native arteries of extremities with rest pain, right leg: Secondary | ICD-10-CM | POA: Diagnosis present

## 2021-03-21 DIAGNOSIS — Z8249 Family history of ischemic heart disease and other diseases of the circulatory system: Secondary | ICD-10-CM | POA: Diagnosis not present

## 2021-03-21 DIAGNOSIS — Z79899 Other long term (current) drug therapy: Secondary | ICD-10-CM | POA: Diagnosis not present

## 2021-03-21 DIAGNOSIS — I110 Hypertensive heart disease with heart failure: Secondary | ICD-10-CM | POA: Diagnosis not present

## 2021-03-21 DIAGNOSIS — Z89612 Acquired absence of left leg above knee: Secondary | ICD-10-CM | POA: Diagnosis not present

## 2021-03-21 DIAGNOSIS — I509 Heart failure, unspecified: Secondary | ICD-10-CM | POA: Diagnosis not present

## 2021-03-21 DIAGNOSIS — Z0000 Encounter for general adult medical examination without abnormal findings: Secondary | ICD-10-CM

## 2021-03-21 HISTORY — PX: LOWER EXTREMITY ANGIOGRAPHY: CATH118251

## 2021-03-21 LAB — COMPREHENSIVE METABOLIC PANEL
ALT: 13 U/L (ref 0–44)
AST: 16 U/L (ref 15–41)
Albumin: 3.7 g/dL (ref 3.5–5.0)
Alkaline Phosphatase: 108 U/L (ref 38–126)
Anion gap: 6 (ref 5–15)
BUN: 17 mg/dL (ref 8–23)
CO2: 27 mmol/L (ref 22–32)
Calcium: 8.9 mg/dL (ref 8.9–10.3)
Chloride: 108 mmol/L (ref 98–111)
Creatinine, Ser: 1.15 mg/dL (ref 0.61–1.24)
GFR, Estimated: >60 mL/min (ref >60)
Glucose, Bld: 119 mg/dL — ABNORMAL HIGH (ref 70–99)
Potassium: 3.9 mmol/L (ref 3.5–5.1)
Sodium: 141 mmol/L (ref 135–145)
Total Bilirubin: 1.2 mg/dL (ref 0.3–1.2)
Total Protein: 7.1 g/dL (ref 6.5–8.1)

## 2021-03-21 LAB — CBC
HCT: 47.1 % (ref 39.0–52.0)
Hemoglobin: 15.3 g/dL (ref 13.0–17.0)
MCH: 26.8 pg (ref 26.0–34.0)
MCHC: 32.5 g/dL (ref 30.0–36.0)
MCV: 82.6 fL (ref 80.0–100.0)
Platelets: 229 10*3/uL (ref 150–400)
RBC: 5.7 MIL/uL (ref 4.22–5.81)
RDW: 15.3 % (ref 11.5–15.5)
WBC: 14.7 10*3/uL — ABNORMAL HIGH (ref 4.0–10.5)
nRBC: 0 % (ref 0.0–0.2)

## 2021-03-21 LAB — BUN: BUN: 17 mg/dL (ref 8–23)

## 2021-03-21 LAB — CREATININE, SERUM
Creatinine, Ser: 1.22 mg/dL (ref 0.61–1.24)
GFR, Estimated: >60 mL/min (ref >60)

## 2021-03-21 LAB — MRSA NEXT GEN BY PCR, NASAL: MRSA by PCR Next Gen: NOT DETECTED

## 2021-03-21 LAB — PROTIME-INR
INR: 1.1 (ref 0.8–1.2)
Prothrombin Time: 14.5 seconds (ref 11.4–15.2)

## 2021-03-21 SURGERY — LOWER EXTREMITY ANGIOGRAPHY
Anesthesia: Moderate Sedation | Site: Leg Lower | Laterality: Right

## 2021-03-21 MED ORDER — CEFAZOLIN SODIUM-DEXTROSE 2-4 GM/100ML-% IV SOLN
2.0000 g | Freq: Once | INTRAVENOUS | Status: AC
  Administered 2021-03-21: 2 g via INTRAVENOUS

## 2021-03-21 MED ORDER — KETOROLAC TROMETHAMINE 0.5 % OP SOLN
1.0000 [drp] | Freq: Two times a day (BID) | OPHTHALMIC | Status: DC
  Administered 2021-03-21: 1 [drp] via OPHTHALMIC
  Filled 2021-03-21: qty 3

## 2021-03-21 MED ORDER — BRIMONIDINE TARTRATE 0.2 % OP SOLN
1.0000 [drp] | Freq: Two times a day (BID) | OPHTHALMIC | Status: DC
  Administered 2021-03-21: 1 [drp] via OPHTHALMIC
  Filled 2021-03-21: qty 5

## 2021-03-21 MED ORDER — GUAIFENESIN 100 MG/5ML PO SOLN: 15.0000 mL | Freq: Three times a day (TID) | ORAL | Status: DC | PRN

## 2021-03-21 MED ORDER — RISAQUAD PO CAPS
1.0000 | ORAL_CAPSULE | Freq: Two times a day (BID) | ORAL | Status: DC
  Administered 2021-03-21 – 2021-03-22 (×2): 1 via ORAL
  Filled 2021-03-21 (×2): qty 1

## 2021-03-21 MED ORDER — FLUTICASONE-UMECLIDIN-VILANT 100-62.5-25 MCG/INH IN AEPB: 1.0000 | INHALATION_SPRAY | Freq: Every morning | RESPIRATORY_TRACT | Status: DC

## 2021-03-21 MED ORDER — POTASSIUM CHLORIDE CRYS ER 20 MEQ PO TBCR
20.0000 meq | EXTENDED_RELEASE_TABLET | Freq: Once | ORAL | Status: AC
  Administered 2021-03-21: 20 meq via ORAL
  Filled 2021-03-21: qty 1

## 2021-03-21 MED ORDER — METOPROLOL TARTRATE 5 MG/5ML IV SOLN: 2.0000 mg | INTRAVENOUS | Status: DC | PRN

## 2021-03-21 MED ORDER — MIDAZOLAM HCL 2 MG/2ML IJ SOLN
INTRAMUSCULAR | Status: DC | PRN
  Administered 2021-03-21: 2 mg via INTRAVENOUS

## 2021-03-21 MED ORDER — APIXABAN 2.5 MG PO TABS
2.5000 mg | ORAL_TABLET | Freq: Two times a day (BID) | ORAL | Status: DC
  Administered 2021-03-22: 2.5 mg via ORAL
  Filled 2021-03-21 (×2): qty 1

## 2021-03-21 MED ORDER — MELATONIN 5 MG PO TABS
5.0000 mg | ORAL_TABLET | Freq: Every evening | ORAL | Status: DC | PRN
  Filled 2021-03-21: qty 1

## 2021-03-21 MED ORDER — FENTANYL CITRATE (PF) 100 MCG/2ML IJ SOLN
INTRAMUSCULAR | Status: DC | PRN
  Administered 2021-03-21 (×2): 50 ug via INTRAVENOUS

## 2021-03-21 MED ORDER — HEPARIN SODIUM (PORCINE) 1000 UNIT/ML IJ SOLN
INTRAMUSCULAR | Status: AC
  Filled 2021-03-21: qty 1

## 2021-03-21 MED ORDER — TIROFIBAN HCL IN NACL 5-0.9 MG/100ML-% IV SOLN
0.1500 ug/kg/min | INTRAVENOUS | Status: DC
  Administered 2021-03-21: 0.15 ug/kg/min via INTRAVENOUS
  Filled 2021-03-21 (×4): qty 100

## 2021-03-21 MED ORDER — DIFLUPREDNATE 0.05 % OP EMUL: 1.0000 [drp] | Freq: Every day | OPHTHALMIC | Status: DC

## 2021-03-21 MED ORDER — FAMOTIDINE 20 MG PO TABS: 40.0000 mg | ORAL_TABLET | Freq: Once | ORAL | Status: DC | PRN

## 2021-03-21 MED ORDER — FERROUS SULFATE 325 (65 FE) MG PO TABS
324.0000 mg | ORAL_TABLET | Freq: Two times a day (BID) | ORAL | Status: DC
  Administered 2021-03-21 – 2021-03-22 (×2): 324 mg via ORAL
  Filled 2021-03-21 (×2): qty 1

## 2021-03-21 MED ORDER — SODIUM CHLORIDE 0.9 % IV SOLN: INTRAVENOUS | Status: DC

## 2021-03-21 MED ORDER — TIMOLOL MALEATE 0.5 % OP SOLN
1.0000 [drp] | Freq: Two times a day (BID) | OPHTHALMIC | Status: DC
  Administered 2021-03-21: 1 [drp] via OPHTHALMIC
  Filled 2021-03-21: qty 5

## 2021-03-21 MED ORDER — ONDANSETRON HCL 4 MG/2ML IJ SOLN: 4.0000 mg | Freq: Four times a day (QID) | INTRAMUSCULAR | Status: DC | PRN

## 2021-03-21 MED ORDER — LOPERAMIDE HCL 2 MG PO CAPS: 2.0000 mg | ORAL_CAPSULE | Freq: Three times a day (TID) | ORAL | Status: DC | PRN

## 2021-03-21 MED ORDER — FUROSEMIDE 40 MG PO TABS
40.0000 mg | ORAL_TABLET | Freq: Every day | ORAL | Status: DC
  Administered 2021-03-21 – 2021-03-22 (×2): 40 mg via ORAL
  Filled 2021-03-21 (×2): qty 1

## 2021-03-21 MED ORDER — FENTANYL 25 MCG/HR TD PT72
1.0000 | MEDICATED_PATCH | TRANSDERMAL | Status: DC
  Administered 2021-03-21: 1 via TRANSDERMAL
  Filled 2021-03-21: qty 1

## 2021-03-21 MED ORDER — BUDESONIDE 0.25 MG/2ML IN SUSP
0.2500 mg | Freq: Two times a day (BID) | RESPIRATORY_TRACT | Status: DC
  Administered 2021-03-21 – 2021-03-22 (×2): 0.25 mg via RESPIRATORY_TRACT
  Filled 2021-03-21 (×2): qty 2

## 2021-03-21 MED ORDER — CEFAZOLIN SODIUM-DEXTROSE 2-4 GM/100ML-% IV SOLN
INTRAVENOUS | Status: AC
  Filled 2021-03-21: qty 100

## 2021-03-21 MED ORDER — DIPHENHYDRAMINE HCL 50 MG/ML IJ SOLN: 50.0000 mg | Freq: Once | INTRAMUSCULAR | Status: DC | PRN

## 2021-03-21 MED ORDER — MIDAZOLAM HCL 2 MG/ML PO SYRP: 8.0000 mg | ORAL_SOLUTION | Freq: Once | ORAL | Status: DC | PRN

## 2021-03-21 MED ORDER — MIDAZOLAM HCL 5 MG/5ML IJ SOLN
INTRAMUSCULAR | Status: AC
  Filled 2021-03-21: qty 5

## 2021-03-21 MED ORDER — PANTOPRAZOLE SODIUM 40 MG PO TBEC: 40.0000 mg | DELAYED_RELEASE_TABLET | Freq: Every day | ORAL | Status: DC

## 2021-03-21 MED ORDER — BACLOFEN 10 MG PO TABS
10.0000 mg | ORAL_TABLET | Freq: Three times a day (TID) | ORAL | Status: DC
  Administered 2021-03-21 – 2021-03-22 (×2): 10 mg via ORAL
  Filled 2021-03-21 (×4): qty 1

## 2021-03-21 MED ORDER — IODIXANOL 320 MG/ML IV SOLN
INTRAVENOUS | Status: DC | PRN
  Administered 2021-03-21: 70 mL

## 2021-03-21 MED ORDER — HYDROMORPHONE HCL 1 MG/ML IJ SOLN: 1.0000 mg | Freq: Once | INTRAMUSCULAR | Status: DC | PRN

## 2021-03-21 MED ORDER — OXYCODONE HCL 5 MG PO TABS
10.0000 mg | ORAL_TABLET | ORAL | Status: DC | PRN
  Administered 2021-03-21: 10 mg via ORAL
  Filled 2021-03-21: qty 2

## 2021-03-21 MED ORDER — UMECLIDINIUM-VILANTEROL 62.5-25 MCG/INH IN AEPB
1.0000 | INHALATION_SPRAY | Freq: Every day | RESPIRATORY_TRACT | Status: DC
  Filled 2021-03-21: qty 14

## 2021-03-21 MED ORDER — OXYMETAZOLINE HCL 0.05 % NA SOLN
2.0000 | Freq: Two times a day (BID) | NASAL | Status: DC | PRN
  Filled 2021-03-21: qty 15

## 2021-03-21 MED ORDER — HYDRALAZINE HCL 20 MG/ML IJ SOLN
5.0000 mg | INTRAMUSCULAR | Status: DC | PRN
Start: 2021-03-21 — End: 2021-03-22

## 2021-03-21 MED ORDER — METHYLPREDNISOLONE SODIUM SUCC 125 MG IJ SOLR: 125.0000 mg | Freq: Once | INTRAMUSCULAR | Status: DC | PRN

## 2021-03-21 MED ORDER — ATORVASTATIN CALCIUM 10 MG PO TABS
10.0000 mg | ORAL_TABLET | Freq: Every day | ORAL | Status: DC
  Administered 2021-03-21: 10 mg via ORAL
  Filled 2021-03-21: qty 1

## 2021-03-21 MED ORDER — FENTANYL CITRATE (PF) 100 MCG/2ML IJ SOLN
INTRAMUSCULAR | Status: AC
  Filled 2021-03-21: qty 2

## 2021-03-21 MED ORDER — OMEGA-3-ACID ETHYL ESTERS 1 G PO CAPS
1.0000 g | ORAL_CAPSULE | Freq: Every day | ORAL | Status: DC
  Administered 2021-03-22: 1 g via ORAL
  Filled 2021-03-21: qty 1

## 2021-03-21 MED ORDER — TAMSULOSIN HCL 0.4 MG PO CAPS
0.4000 mg | ORAL_CAPSULE | Freq: Every day | ORAL | Status: DC
  Administered 2021-03-22: 0.4 mg via ORAL
  Filled 2021-03-21: qty 1

## 2021-03-21 MED ORDER — MORPHINE SULFATE (CONCENTRATE) 10 MG /0.5 ML PO SOLN: 10.0000 mg | ORAL | Status: DC | PRN

## 2021-03-21 MED ORDER — POTASSIUM CHLORIDE CRYS ER 20 MEQ PO TBCR
20.0000 meq | EXTENDED_RELEASE_TABLET | Freq: Every day | ORAL | Status: DC
  Administered 2021-03-22: 20 meq via ORAL
  Filled 2021-03-21: qty 1

## 2021-03-21 MED ORDER — ACETAMINOPHEN 325 MG PO TABS: 650.0000 mg | ORAL_TABLET | ORAL | Status: DC | PRN

## 2021-03-21 MED ORDER — ASCORBIC ACID 500 MG PO TABS
500.0000 mg | ORAL_TABLET | Freq: Two times a day (BID) | ORAL | Status: DC
  Administered 2021-03-21 – 2021-03-22 (×2): 500 mg via ORAL
  Filled 2021-03-21 (×2): qty 1

## 2021-03-21 MED ORDER — PANTOPRAZOLE SODIUM 40 MG PO TBEC
40.0000 mg | DELAYED_RELEASE_TABLET | Freq: Two times a day (BID) | ORAL | Status: DC
  Administered 2021-03-21 – 2021-03-22 (×2): 40 mg via ORAL
  Filled 2021-03-21 (×2): qty 1

## 2021-03-21 MED ORDER — DOCUSATE SODIUM 100 MG PO CAPS: 100.0000 mg | ORAL_CAPSULE | Freq: Every day | ORAL | Status: DC | PRN

## 2021-03-21 MED ORDER — NITROGLYCERIN 0.4 MG SL SUBL: 0.4000 mg | SUBLINGUAL_TABLET | SUBLINGUAL | Status: DC | PRN

## 2021-03-21 MED ORDER — ALUM & MAG HYDROXIDE-SIMETH 200-200-20 MG/5ML PO SUSP: 15.0000 mL | ORAL | Status: DC | PRN

## 2021-03-21 MED ORDER — TIROFIBAN (AGGRASTAT) BOLUS VIA INFUSION: 25.0000 ug/kg | Freq: Once | INTRAVENOUS | Status: AC

## 2021-03-21 MED ORDER — BRIMONIDINE TARTRATE-TIMOLOL 0.2-0.5 % OP SOLN
1.0000 [drp] | Freq: Two times a day (BID) | OPHTHALMIC | Status: DC
  Filled 2021-03-21: qty 5

## 2021-03-21 MED ORDER — SALINE SPRAY 0.65 % NA SOLN
2.0000 | Freq: Two times a day (BID) | NASAL | Status: DC | PRN
  Filled 2021-03-21: qty 44

## 2021-03-21 MED ORDER — HEPARIN SODIUM (PORCINE) 1000 UNIT/ML IJ SOLN
INTRAMUSCULAR | Status: DC | PRN
  Administered 2021-03-21: 5000 [IU] via INTRAVENOUS

## 2021-03-21 MED ORDER — LABETALOL HCL 5 MG/ML IV SOLN: 10.0000 mg | INTRAVENOUS | Status: DC | PRN

## 2021-03-21 MED ORDER — PHENOL 1.4 % MT LIQD
1.0000 | OROMUCOSAL | Status: DC | PRN
  Filled 2021-03-21: qty 177

## 2021-03-21 MED ORDER — GUAIFENESIN-DM 100-10 MG/5ML PO SYRP
15.0000 mL | ORAL_SOLUTION | ORAL | Status: DC | PRN
  Filled 2021-03-21: qty 15

## 2021-03-21 MED ORDER — TIROFIBAN HCL IV 12.5 MG/250 ML
INTRAVENOUS | Status: AC
  Administered 2021-03-21: 2267.5 ug via INTRAVENOUS
  Filled 2021-03-21: qty 250

## 2021-03-21 MED ORDER — GABAPENTIN 100 MG PO CAPS
100.0000 mg | ORAL_CAPSULE | Freq: Every day | ORAL | Status: DC
  Administered 2021-03-21: 100 mg via ORAL
  Filled 2021-03-21: qty 1

## 2021-03-21 SURGICAL SUPPLY — 30 items
BALLN LUTONIX 018 5X220X130 (BALLOONS) ×2
BALLN LUTONIX 018 6X40X130 (BALLOONS) ×2
BALLN ULTRVRSE 2.5X300X150 (BALLOONS) ×2
BALLN ULTRVRSE 3X220X150 (BALLOONS) ×2
BALLOON LUTONIX 018 5X220X130 (BALLOONS) IMPLANT
BALLOON LUTONIX 018 6X40X130 (BALLOONS) IMPLANT
BALLOON ULTRVRSE 2.5X300X150 (BALLOONS) IMPLANT
BALLOON ULTRVRSE 3X220X150 (BALLOONS) IMPLANT
CANISTER PENUMBRA ENGINE (MISCELLANEOUS) ×1 IMPLANT
CANNULA 5F STIFF (CANNULA) ×1 IMPLANT
CATH INDIGO CAT6 KIT (CATHETERS) ×1 IMPLANT
CATH NAVICROSS ANGLED 135CM (MICROCATHETER) ×1 IMPLANT
CATH PIG 70CM (CATHETERS) ×1 IMPLANT
CATH VERT 5X100 (CATHETERS) ×1 IMPLANT
COVER PROBE U/S 5X48 (MISCELLANEOUS) ×1 IMPLANT
DEVICE SAFEGUARD 24CM (GAUZE/BANDAGES/DRESSINGS) ×1 IMPLANT
DEVICE STARCLOSE SE CLOSURE (Vascular Products) ×1 IMPLANT
GLIDEWIRE ADV .035X260CM (WIRE) ×1 IMPLANT
GUIDEWIRE PFTE-COATED .018X300 (WIRE) ×1 IMPLANT
GUIDEWIRE SUPER STIFF .035X180 (WIRE) ×1 IMPLANT
KIT ENCORE 26 ADVANTAGE (KITS) ×1 IMPLANT
PACK ANGIOGRAPHY (CUSTOM PROCEDURE TRAY) ×2 IMPLANT
SHEATH ANL2 6FRX45 HC (SHEATH) ×1 IMPLANT
SHEATH BRITE TIP 5FRX11 (SHEATH) ×1 IMPLANT
SHEATH PINNACLE ST 6F 45CM (SHEATH) ×1 IMPLANT
STENT VIABAHN 6X250X120 (Permanent Stent) ×1 IMPLANT
STENT VIABAHN 6X25X120 (Permanent Stent) ×1 IMPLANT
SYR MEDRAD MARK 7 150ML (SYRINGE) ×1 IMPLANT
WIRE G V18X300CM (WIRE) ×1 IMPLANT
WIRE GUIDERIGHT .035X150 (WIRE) ×1 IMPLANT

## 2021-03-21 NOTE — Op Note (Signed)
[facility]  Percutaneous Study/Intervention Procedural Note   Date of Surgery: 03/21/2021  Surgeon(s):Chozen Latulippe    Assistants:none  Pre-operative Diagnosis: PAD with rest pain right lower extremity  Post-operative diagnosis:  Same  Procedure(s) Performed:             1.  Ultrasound guidance for vascular access left femoral artery             2.  Catheter placement into right common femoral artery from left femoral approach             3.  Aortogram and selective right lower extremity angiogram             4.  Mechanical thrombectomy of the right superficial femoral artery, popliteal artery, anterior tibial artery, and peroneal artery with penumbra CAT 6 device             5.  Percutaneous transluminal angioplasty of right anterior tibial artery with 2.5 mm diameter angioplasty balloon  6.  Percutaneous transluminal angioplasty of right peroneal artery with 3 mm diameter angioplasty balloon  7.  Percutaneous transluminal angioplasty of the entire right SFA and popliteal arteries with multiple inflations with a 5 mm diameter by 22 cm length Lutonix drug-coated angioplasty balloon  8.  Stent placement to the proximal right SFA with 6 mm diameter by 2.5 cm length Viabahn stent  9.  Stent placement to the right popliteal artery and distal SFA with 6 mm diameter by 25 cm length Viabahn stent             10.  StarClose closure device left femoral artery  EBL: 200 cc  Contrast: 70 cc  Fluoro Time: 14.4 minutes  Moderate Conscious Sedation Time: approximately 81 minutes using 2 mg of Versed and 100 mcg of Fentanyl              Indications:  Patient is a 70 y.o.male with acute on chronic ischemia of the right lower extremity now with rest pain.  He has had multiple previous interventions and has already lost the left leg. The patient has noninvasive study showing essentially no flow below the common femoral artery on the right. The patient is brought in for angiography  for further evaluation and potential treatment.  Due to the limb threatening nature of the situation, angiogram was performed for attempted limb salvage. The patient is aware that if the procedure fails, amputation would be expected.  The patient also understands that even with successful revascularization, amputation may still be required due to the severity of the situation.  Risks and benefits are discussed and informed consent is obtained.   Procedure:  The patient was identified and appropriate procedural time out was performed.  The patient was then placed supine on the table and prepped and draped in the usual sterile fashion. Moderate conscious sedation was administered during a face to face encounter with the patient throughout the procedure with my supervision of the RN administering medicines and monitoring the patient's vital signs, pulse oximetry, telemetry and mental status throughout from the start of the procedure until the patient was taken to the recovery room. Ultrasound was used to evaluate the left common femoral artery.  It was patent but diseased.  A digital ultrasound image was acquired.  A Seldinger needle was used to access the left common femoral artery under direct ultrasound guidance and a permanent image was performed.  A 0.035 J wire was advanced without resistance and a 5Fr sheath was  placed.  Pigtail catheter was placed into the aorta and an AP aortogram was performed. This demonstrated normal renal arteries and normal aorta and iliac segments without significant stenosis including the previously placed iliac stents. I then crossed the aortic bifurcation and advanced to the right femoral head. Selective right lower extremity angiogram was then performed. This demonstrated occlusion of the right superficial femoral artery just beyond its origin top of the previously placed stents.  He had extensive stents down to the below-knee popliteal artery which were all occluded.  He had  occlusion of all 3 tibial vessels with reconstitution of the peroneal artery in the midsegment. It was felt that it was in the patient's best interest to proceed with intervention after these images to avoid a second procedure and a larger amount of contrast and fluoroscopy based off of the findings from the initial angiogram. The patient was systemically heparinized and a 6 [place] destination sheath was then placed over the Terumo Advantage wire. I then used a Kumpe catheter and the advantage wire to easily navigate through the SFA and popliteal occlusions and down into the anterior tibial artery initially.  This went almost all the way to the ankle, and I exchanged for a V 18 wire through a Nava cross catheter.  I then proceeded with mechanical thrombectomy with 3 passes through the entire SFA and popliteal arteries and down into the proximal to mid anterior tibial artery.  Thrombus was removed, but there remained minimal flow.  I then proceeded with angioplasty.  A 2.5 mm diameter by 30 cm length angioplasty balloon was inflated from the mid to distal anterior tibial artery up to the popliteal artery.  This was inflated to 8 atm for 1 minute.  I then used 3 inflations with a 5 mm diameter by 22 cm length Lutonix drug-coated angioplasty balloon to treat the entire SFA and down to the distal popliteal artery.  Each of these inflations were 10 to 12 atm for a minute.  Following this, there still remained minimal flow.  The anterior tibial artery was not continuous distally, but the area that was treated did actually have flow and there were some collaterals.  There remained high-grade stenosis and thrombus in the popliteal artery and the distal portion of the previously placed stents and a near occlusive stenosis at the proximal SFA at the origin of the previously placed stents.  Using the [facility] cross catheter and advantage wire was then able to gain access to the peroneal artery which is actually the larger and  better runoff vessel distally.  Intraluminal flow was confirmed.  I then exchanged back for a V 18 wire and made another pass with the penumbra CAT 6 device in the SFA, popliteal arteries, tibioperoneal trunk, and peroneal artery.  There remained sluggish flow.  The peroneal artery is then treated with a 3 mm diameter by 22 cm length angioplasty balloon inflated to 8 atm for 1 minute.  With a catheter in the popliteal artery, this was now found to be patent with less than 20% residual stenosis.  There remained a stenosis in the popliteal artery as well as in the proximal SFA.  I then proceeded with stent placement.  A 6 mm diameter by 2.5 cm length stent was placed at the was proximal SFA near the origin down to the previously placed stent taking care not to impinge on the profunda femoris artery.  This is postdilated with a 6 mm balloon.  A 6 mm diameter by 25  cm length Viabahn stent was then deployed from the distal popliteal artery up to the distal SFA.  This is postdilated with a 5 mm diameter angioplasty balloon.  Following this, there now was inline flow with less than 20% residual stenosis seen in the SFA and popliteal arteries. I elected to terminate the procedure. The sheath was removed and StarClose closure device was deployed in the left femoral artery with excellent hemostatic result. The patient was taken to the recovery room in stable condition having tolerated the procedure well.  Findings:               Aortogram:  This demonstrated normal renal arteries and normal aorta and iliac segments without significant stenosis including the previously placed iliac stents.             Right Lower Extremity: Occlusion of the right superficial femoral artery just beyond its origin top of the previously placed stents.  He had extensive stents down to the below-knee popliteal artery which were all occluded.  He had occlusion of all 3 tibial vessels with reconstitution of the peroneal artery in the  midsegment.   Disposition: Patient was taken to the recovery room in stable condition having tolerated the procedure well.  Complications: None  [name] 03/21/2021 9:56 AM   This note was created with Dragon Medical transcription system. Any errors in dictation are purely unintentional.

## 2021-03-21 NOTE — Progress Notes (Addendum)
Upon arriving to floor,   left groin site checked  bleeding noted. Pacu  nurse made aware, PAD was removed  by Pacu  nurse, pressure was held by me, while she  went downstairs  to get another PAD. Once brought back up ,myself and stephanie charge nurse applied  new PAD and inserted 40cc of air. Bleeding currenly stopped, will continue to monitor closely

## 2021-03-22 ENCOUNTER — Encounter: Payer: Self-pay | Admitting: Vascular Surgery

## 2021-03-22 DIAGNOSIS — I998 Other disorder of circulatory system: Secondary | ICD-10-CM | POA: Diagnosis not present

## 2021-03-22 DIAGNOSIS — I70221 Atherosclerosis of native arteries of extremities with rest pain, right leg: Secondary | ICD-10-CM | POA: Diagnosis not present

## 2021-03-22 LAB — CBC
HCT: 44.3 % (ref 39.0–52.0)
Hemoglobin: 14.3 g/dL (ref 13.0–17.0)
MCH: 26.6 pg (ref 26.0–34.0)
MCHC: 32.3 g/dL (ref 30.0–36.0)
MCV: 82.5 fL (ref 80.0–100.0)
Platelets: 229 10*3/uL (ref 150–400)
RBC: 5.37 MIL/uL (ref 4.22–5.81)
RDW: 15.1 % (ref 11.5–15.5)
WBC: 14.6 10*3/uL — ABNORMAL HIGH (ref 4.0–10.5)
nRBC: 0 % (ref 0.0–0.2)

## 2021-03-22 MED ORDER — ASPIRIN EC 81 MG PO TBEC
81.0000 mg | DELAYED_RELEASE_TABLET | Freq: Every day | ORAL | Status: DC
  Administered 2021-03-22: 81 mg via ORAL
  Filled 2021-03-22: qty 1

## 2021-03-22 MED ORDER — ASPIRIN 81 MG PO TBEC: 81.0000 mg | DELAYED_RELEASE_TABLET | Freq: Every day | ORAL | 3 refills | Status: AC

## 2021-03-22 NOTE — Discharge Summary (Signed)
[facility]    Discharge Summary  Patient ID:  Timothy Juarez MRN: [id number] DOB/AGE: 01-11-1951 70 y.o.  Admit date: 03/21/2021 Discharge date: 03/22/2021 Date of Surgery: 03/21/2021 Surgeon: Surgeon(s): Dew, Erskine Squibb, MD  Admission Diagnosis: Ischemic leg [I99.8]  Discharge Diagnoses:  Ischemic leg [I99.8]  Secondary Diagnoses: Past Medical History:  Diagnosis Date   Acute embolism and thombos unsp deep vn unsp lower extremity ([place])    Acute respiratory failure ([place])    AKI (acute kidney injury) ([place]) 12/27/2018   Allergy    Anemia    Aneurysm of unspecified site ([place])    ARF (acute respiratory failure) ([place])    H/O   Bronchitis    Cellulitis and abscess of leg 09/27/2019   CHF (congestive heart failure) (HCC)    COPD (chronic obstructive pulmonary disease) (HCC)    Cough    Epistaxis    Gastrointestinal hemorrhage    GERD (gastroesophageal reflux disease)    Gout    Hematemesis 04/27/2020   High anion gap metabolic acidosis 12/04/6220   Hyperlipidemia    Hypertension    Hypokalemia    Infection of above knee amputation stump ([place]) 09/27/2019   Insomnia    Iron deficiency anemia 04/21/2017   Muscle contracture    MUSCLE SPASMS, muscle weakness   Peripheral vascular disease (HCC)    Pneumonia    Pressure ulcer    Severe sepsis ([place]) 12/27/2018   Toxic encephalopathy 12/27/2018   Procedure(s): 03/21/21:             1.  Ultrasound guidance for vascular access left femoral artery             2.  Catheter placement into right common femoral artery from left femoral approach             3.  Aortogram and selective right lower extremity angiogram             4.  Mechanical thrombectomy of the right superficial femoral artery, popliteal artery, anterior tibial artery, and peroneal artery with penumbra CAT 6 device             5.  Percutaneous transluminal angioplasty of right anterior tibial artery with 2.5 mm diameter angioplasty balloon              6.  Percutaneous transluminal angioplasty of right peroneal artery with 3 mm diameter angioplasty balloon             7.  Percutaneous transluminal angioplasty of the entire right SFA and popliteal arteries with multiple inflations with a 5 mm diameter by 22 cm length Lutonix drug-coated angioplasty balloon             8.  Stent placement to the proximal right SFA with 6 mm diameter by 2.5 cm length Viabahn stent             9.  Stent placement to the right popliteal artery and distal SFA with 6 mm diameter by 25 cm length Viabahn stent             10.  StarClose closure device left femoral artery  Discharged Condition: Good  HPI / Hospital Course:  Patient is a 70 year old male with acute on chronic ischemia of the right lower extremity now with rest pain.  He has had multiple previous interventions and has already lost the left leg. The patient has noninvasive study showing essentially no flow below the common femoral  artery on the right. The patient is brought in for angiography for further evaluation and potential treatment.  Due to the limb threatening nature of the situation, angiogram was performed for attempted limb salvage. The patient is aware that if the procedure fails, amputation would be expected.  The patient also understands that even with successful revascularization, amputation may still be required due to the severity of the situation.  Risks and benefits are discussed and informed consent is obtained.   On 03/21/21, the patient underwent:             1.  Ultrasound guidance for vascular access left femoral artery             2.  Catheter placement into right common femoral artery from left femoral approach             3.  Aortogram and selective right lower extremity angiogram             4.  Mechanical thrombectomy of the right superficial femoral artery, popliteal artery, anterior tibial artery, and peroneal artery with penumbra CAT 6 device             5.  Percutaneous  transluminal angioplasty of right anterior tibial artery with 2.5 mm diameter angioplasty balloon             6.  Percutaneous transluminal angioplasty of right peroneal artery with 3 mm diameter angioplasty balloon             7.  Percutaneous transluminal angioplasty of the entire right SFA and popliteal arteries with multiple inflations with a 5 mm diameter by 22 cm length Lutonix drug-coated angioplasty balloon             8.  Stent placement to the proximal right SFA with 6 mm diameter by 2.5 cm length Viabahn stent             9.  Stent placement to the right popliteal artery and distal SFA with 6 mm diameter by 25 cm length Viabahn stent             10.  StarClose closure device left femoral artery  The patient tolerated the procedure and was transferred to the ICU for Aggrastat infusion overnight. The following day the patient was restarted on ASA and Eliquis. During this inpatient stay, his diet was advanced, his pain was controlled thorough the use of PO pain meds, and he was ambulating at baseline. Day of discharge, the patient was afebrile with vital stable sign and an improved physical exam.   Physical Exam:  A&Ox3, NAD CV: RRR Pulm: CTA bilaterally Abdomen: soft, non-tender, Non-distended Extremity:   Right Lower Extremity: Thigh soft, calf soft. Warm distally to toes.   Labs: As below  Complications: None  Consults: None  Significant Diagnostic Studies: CBC Lab Results  Component Value Date   WBC 14.6 (H) 03/22/2021   HGB 14.3 03/22/2021   HCT 44.3 03/22/2021   MCV 82.5 03/22/2021   PLT 229 03/22/2021   BMET    Component Value Date/Time   NA 141 03/21/2021 1550   NA 136 12/01/2013 1317   K 3.9 03/21/2021 1550   K 3.9 12/01/2013 1317   CL 108 03/21/2021 1550   CL 100 12/01/2013 1317   CO2 27 03/21/2021 1550   CO2 33 (H) 12/01/2013 1317   GLUCOSE 119 (H) 03/21/2021 1550   GLUCOSE 91 12/01/2013 1317   BUN 17 03/21/2021 1550   BUN 24 (H)  09/25/2014 0846    CREATININE 1.15 03/21/2021 1550   CREATININE 1.29 09/25/2014 0846   CALCIUM 8.9 03/21/2021 1550   CALCIUM 9.4 12/01/2013 1317   GFRNONAA >60 03/21/2021 1550   GFRNONAA 60 (L) 09/25/2014 0846   GFRNONAA >60 03/16/2014 0750   GFRAA >60 04/30/2020 0650   GFRAA >60 09/25/2014 0846   GFRAA >60 03/16/2014 0750   COAG Lab Results  Component Value Date   INR 1.1 03/21/2021   INR 1.2 04/27/2020   INR 1.1 09/29/2019   Disposition:  Discharge to :Home  Allergies as of 03/22/2021   No Known Allergies      Medication List     TAKE these medications    acetaminophen 325 MG tablet Commonly known as: TYLENOL Take 650 mg by mouth every 4 (four) hours as needed for fever.   acidophilus Caps capsule Take 1 capsule by mouth 2 (two) times daily.   apixaban 2.5 MG Tabs tablet Commonly known as: Eliquis Take 1 tablet (2.5 mg total) by mouth 2 (two) times daily.   ascorbic acid 500 MG tablet Commonly known as: VITAMIN C Take 500 mg by mouth 2 (two) times daily.   aspirin 81 MG EC tablet Take 1 tablet (81 mg total) by mouth daily. Swallow whole. Start taking on: March 23, 2021   atorvastatin 10 MG tablet Commonly known as: Lipitor Take 1 tablet (10 mg total) by mouth daily. What changed: when to take this   baclofen 10 MG tablet Commonly known as: LIORESAL Take 10 mg by mouth 3 (three) times daily. Hold for sedation   Combigan 0.2-0.5 % ophthalmic solution Generic drug: brimonidine-timolol Place 1 drop into the right eye 2 (two) times daily.   docusate sodium 100 MG capsule Commonly known as: COLACE Take 100 mg by mouth daily as needed for mild constipation.   Durezol 0.05 % Emul Generic drug: Difluprednate Apply 1 drop to eye daily.   fentaNYL 25 MCG/HR Commonly known as: [place] 1 patch onto the skin every 3 (three) days.   ferrous sulfate 324 (65 Fe) MG Tbec Take 324 mg by mouth 2 (two) times daily.   Fish Oil 1000 MG Caps Take 1,000 mg by mouth daily.    furosemide 40 MG tablet Commonly known as: LASIX Take 1 tablet (40 mg total) by mouth daily.   gabapentin 100 MG capsule Commonly known as: NEURONTIN Take 100 mg by mouth at bedtime.   guaiFENesin 100 MG/5ML Soln Commonly known as: ROBITUSSIN Take 15 mLs by mouth 3 (three) times daily as needed for cough (congestion).   ketorolac 0.5 % ophthalmic solution Commonly known as: ACULAR Place 1 drop into the left eye 2 (two) times daily.   loperamide 2 MG capsule Commonly known as: IMODIUM Take 2 mg by mouth 3 (three) times daily as needed for diarrhea or loose stools.   melatonin 5 MG Tabs Take 5 mg by mouth at bedtime as needed (insomnia).   nitroGLYCERIN 0.4 MG SL tablet Commonly known as: NITROSTAT Place 0.14 mg under the tongue every 5 (five) minutes as needed for chest pain.   Oxycodone HCl 10 MG Tabs Take 10 mg by mouth every 4 (four) hours as needed (pain).   oxymetazoline 0.05 % nasal spray Commonly known as: AFRIN Place 2 sprays into both nostrils 2 (two) times daily as needed (For nose bleeds).   pantoprazole 40 MG tablet Commonly known as: Protonix Take 1 tablet (40 mg total) by mouth 2 (two) times daily.  potassium chloride SA 20 MEQ tablet Commonly known as: KLOR-CON Take 20 mEq by mouth daily. With or after meal   sodium chloride 0.65 % Soln nasal spray Commonly known as: OCEAN Place 2 sprays into both nostrils 2 (two) times daily as needed for congestion.   tamsulosin 0.4 MG Caps capsule Commonly known as: FLOMAX Take 1 capsule (0.4 mg total) by mouth daily.   Trelegy Ellipta 100-62.5-25 MCG/INH Aepb Generic drug: Fluticasone-Umeclidin-Vilant Inhale 1 puff into the lungs every morning.       ASK your doctor about these medications    ammonium lactate 12 % cream Commonly known as: AMLACTIN Apply topically 2 (two) times daily. Apply to right foot   ipratropium-albuterol 0.5-2.5 (3) MG/3ML Soln Commonly known as: DUONEB Take by nebulization 3  (three) times daily as needed.   morphine CONCENTRATE 10 mg / 0.5 ml concentrated solution   promethazine 25 MG/ML injection Commonly known as: PHENERGAN   triamcinolone cream 0.1 % Commonly known as: KENALOG Apply 1 application topically 2 (two) times daily.       Verbal and written Discharge instructions given to the patient. Wound care per Discharge AVS  Follow-up Information     Dew, Erskine Squibb, MD Follow up in 1 month(s).   Specialties: Vascular Surgery, Radiology, Interventional Cardiology Why: Can see Dew or Arna Medici. Will need ABI with visit. Contact information: [place] [address] [phone]                Signed: Sela Hua, PA-C  03/22/2021, 1:04 PM

## 2021-03-22 NOTE — Interval H&P Note (Signed)
History and Physical Interval Note:  03/22/2021 3:29 PM  Timothy Juarez  has presented today for surgery, with the diagnosis of RLE Angio   BARD    Ischemic leg.  The various methods of treatment have been discussed with the patient and family. After consideration of risks, benefits and other options for treatment, the patient has consented to  Procedure(s): LOWER EXTREMITY ANGIOGRAPHY (Right) as a surgical intervention.  The patient's history has been reviewed, patient examined, no change in status, stable for surgery.  I have reviewed the patient's chart and labs.  Questions were answered to the patient's satisfaction.     [name]

## 2021-03-22 NOTE — TOC Transition Note (Addendum)
Transition of Care [facility]) - CM/SW Discharge Note   Patient Details  Name: Timothy Juarez MRN: [id number] Date of Birth: [birth year]  [facility] CM/SW Contact:  Alberteen Sam, LCSW Phone Number: 03/22/2021, 2:19 PM   Clinical Narrative:     Patient from [facility], to return today at discharge. Will be transported back via [facility]. Neoma Laming at [facility] reports they are able to accept patient back.   Number for report is [phone].  Patient daughter was called and updated on dc today back to [facility] - no answer lvm.   Final next level of care: Skilled Nursing Facility Barriers to Discharge: No Barriers Identified   Patient Goals and CMS Choice   CMS Medicare.gov Compare Post Acute Care list provided to:: Patient Choice offered to / list presented to : Patient  Discharge Placement              Patient chooses bed at: [facility] Patient to be transferred to facility by: [facility]   Patient and family notified of of transfer: 03/22/21  Discharge Plan and Services                                     Social Determinants of Health (SDOH) Interventions     Readmission Risk Interventions No flowsheet data found.

## 2021-03-22 NOTE — Progress Notes (Signed)
Report called to [facility]. 

## 2021-04-19 ENCOUNTER — Other Ambulatory Visit (INDEPENDENT_AMBULATORY_CARE_PROVIDER_SITE_OTHER): Payer: Self-pay | Admitting: Vascular Surgery

## 2021-04-19 DIAGNOSIS — I70221 Atherosclerosis of native arteries of extremities with rest pain, right leg: Secondary | ICD-10-CM

## 2021-04-19 DIAGNOSIS — Z95820 Peripheral vascular angioplasty status with implants and grafts: Secondary | ICD-10-CM

## 2021-04-23 ENCOUNTER — Encounter (INDEPENDENT_AMBULATORY_CARE_PROVIDER_SITE_OTHER): Payer: Self-pay | Admitting: Nurse Practitioner

## 2021-04-23 ENCOUNTER — Ambulatory Visit (INDEPENDENT_AMBULATORY_CARE_PROVIDER_SITE_OTHER): Payer: Medicare Other | Admitting: Nurse Practitioner

## 2021-04-23 ENCOUNTER — Encounter (INDEPENDENT_AMBULATORY_CARE_PROVIDER_SITE_OTHER): Payer: Medicare Other

## 2021-05-15 ENCOUNTER — Telehealth (INDEPENDENT_AMBULATORY_CARE_PROVIDER_SITE_OTHER): Payer: Self-pay

## 2021-05-15 ENCOUNTER — Ambulatory Visit (INDEPENDENT_AMBULATORY_CARE_PROVIDER_SITE_OTHER): Payer: Medicare Other

## 2021-05-15 ENCOUNTER — Ambulatory Visit (INDEPENDENT_AMBULATORY_CARE_PROVIDER_SITE_OTHER): Payer: Medicare Other | Admitting: Nurse Practitioner

## 2021-05-15 ENCOUNTER — Encounter (INDEPENDENT_AMBULATORY_CARE_PROVIDER_SITE_OTHER): Payer: Self-pay

## 2021-05-15 ENCOUNTER — Other Ambulatory Visit: Payer: Self-pay

## 2021-05-15 VITALS — BP 150/76 | HR 69 | Ht 73.0 in | Wt 224.0 lb

## 2021-05-15 DIAGNOSIS — I10 Essential (primary) hypertension: Secondary | ICD-10-CM

## 2021-05-15 DIAGNOSIS — I739 Peripheral vascular disease, unspecified: Secondary | ICD-10-CM

## 2021-05-15 DIAGNOSIS — Z95820 Peripheral vascular angioplasty status with implants and grafts: Secondary | ICD-10-CM

## 2021-05-15 DIAGNOSIS — I70221 Atherosclerosis of native arteries of extremities with rest pain, right leg: Secondary | ICD-10-CM | POA: Diagnosis not present

## 2021-05-15 DIAGNOSIS — E785 Hyperlipidemia, unspecified: Secondary | ICD-10-CM

## 2021-05-15 NOTE — Telephone Encounter (Signed)
Patient is schedule for right lower extremity angiogram with Dr Lucky Cowboy on 05/20/21 arrival time 8:15 am at the [facility]. Pre-procedure instructions will be faxed over the [facility]

## 2021-05-19 ENCOUNTER — Other Ambulatory Visit (INDEPENDENT_AMBULATORY_CARE_PROVIDER_SITE_OTHER): Payer: Self-pay | Admitting: Nurse Practitioner

## 2021-05-19 NOTE — H&P (View-Only) (Signed)
Subjective:    Patient ID: Timothy Juarez, male    DOB: 04/17/1951, 70 y.o.   MRN: [id number] Chief Complaint  Patient presents with   Follow-up    [facility] 1 Mo FU ischemic leg    Timothy Juarez 70 year old male that returns to the office for followup and review of the noninvasive studies. There has been a significant deterioration in the lower extremity symptoms.  The patient notes interval shortening of their claudication distance and development of mild rest pain symptoms. No new ulcers or wounds have occurred since the last visit.  Patient underwent extensive intervention on 03/21/2021 when he had occlusion from his common femoral artery and down.  There have been no significant changes to the patient's overall health care.  The patient denies amaurosis fugax or recent TIA symptoms. There are no recent neurological changes noted. The patient denies history of DVT, PE or superficial thrombophlebitis. The patient denies recent episodes of angina or shortness of breath.   ABI's Rt=0.42 and Lt=n/a (previous ABI's Rt=0.69 and Lt=n/a) Duplex US of the lower extremity arterial system shows monophasic waveforms in the right lower extremity with flat toe waveforms.   Review of Systems  Musculoskeletal:  Positive for gait problem.  Neurological:  Positive for weakness.  All other systems reviewed and are negative.     Objective:   Physical Exam Vitals reviewed.  HENT:     Head: Normocephalic.  Cardiovascular:     Rate and Rhythm: Normal rate.     Pulses:          Dorsalis pedis pulses are 0 on the right side.       Posterior tibial pulses are 0 on the right side.  Pulmonary:     Effort: Pulmonary effort is normal.  Musculoskeletal:     Left Lower Extremity: Left leg is amputated above knee.  Skin:    General: Skin is dry.  Neurological:     Mental Status: He is alert and oriented to person, place, and time.     Motor: Weakness present.  Psychiatric:        Mood and Affect:  Mood normal.        Behavior: Behavior normal.        Thought Content: Thought content normal.        Judgment: Judgment normal.    BP (!) 150/76   Pulse 69   Ht 6\' 1"  (1.854 m)   Wt 224 lb (101.6 kg)   BMI 29.55 kg/m   Past Medical History:  Diagnosis Date   Acute embolism and thombos unsp deep vn unsp lower extremity (HCC)    Acute respiratory failure (HCC)    AKI (acute kidney injury) ([place]) 12/27/2018   Allergy    Anemia    Aneurysm of unspecified site ([place])    ARF (acute respiratory failure) (HCC)    H/O   Bronchitis    Cellulitis and abscess of leg 09/27/2019   CHF (congestive heart failure) (HCC)    COPD (chronic obstructive pulmonary disease) (HCC)    Cough    Epistaxis    Gastrointestinal hemorrhage    GERD (gastroesophageal reflux disease)    Gout    Hematemesis 04/27/2020   High anion gap metabolic acidosis 3/76/2831   Hyperlipidemia    Hypertension    Hypokalemia    Infection of above knee amputation stump ([place]) 09/27/2019   Insomnia    Iron deficiency anemia 04/21/2017   Muscle contracture    MUSCLE SPASMS, muscle  weakness   Peripheral vascular disease (HCC)    Pneumonia    Pressure ulcer    Severe sepsis ([place]) 12/27/2018   Toxic encephalopathy 12/27/2018    Social History   Socioeconomic History   Marital status: Legally Separated    Spouse name: Not on file   Number of children: Not on file   Years of education: Not on file   Highest education level: Not on file  Occupational History   Not on file  Tobacco Use   Smoking status: Former    Packs/day: 1.00    Years: 44.00    Pack years: 44.00    Types: Cigarettes    Quit date: 11/17/2012    Years since quitting: 8.5   Smokeless tobacco: Never  Vaping Use   Vaping Use: Never used  Substance and Sexual Activity   Alcohol use: Not Currently   Drug use: Never   Sexual activity: Never  Other Topics Concern   Not on file  Social History Narrative   Not on file   Social Determinants of Health    Financial Resource Strain: Not on file  Food Insecurity: Not on file  Transportation Needs: Not on file  Physical Activity: Not on file  Stress: Not on file  Social Connections: Not on file  Intimate Partner Violence: Not on file    Past Surgical History:  Procedure Laterality Date   AMPUTATION Left 09/19/2015   Procedure: AMPUTATION BELOW KNEE;  Surgeon: Algernon Huxley, MD;  Location: [facility];  Service: Vascular;  Laterality: Left;   AMPUTATION Left 11/01/2015   Procedure: AMPUTATION ABOVE KNEE;  Surgeon: Algernon Huxley, MD;  Location: [facility];  Service: Vascular;  Laterality: Left;   AMPUTATION Left 09/29/2019   Procedure: IRRIGATION AND DEBRIDEMENT OF LEFT AKA;  Surgeon: Algernon Huxley, MD;  Location: [facility];  Service: General;  Laterality: Left;   APPLICATION OF WOUND VAC Left 10/18/2015   Procedure: APPLICATION OF WOUND VAC;  Surgeon: Algernon Huxley, MD;  Location: [facility];  Service: Vascular;  Laterality: Left;   APPLICATION OF WOUND VAC Left 09/30/2019   Procedure: APPLICATION OF WOUND VAC;  Surgeon: Katha Cabal, MD;  Location: [facility];  Service: Vascular;  Laterality: Left;  serial # [id number]   [id number] CATHETERIZATION     CATARACT EXTRACTION W/PHACO Right 08/18/2019   Procedure: CATARACT EXTRACTION PHACO AND INTRAOCULAR LENS PLACEMENT ([place]) RIGHT;  Surgeon: Birder Robson, MD;  Location: [facility];  Service: Ophthalmology;  Laterality: Right;  [place] 03:29.0 CDE 45.68 Fluid Pack Lot # [id number]   CENTRAL VENOUS CATHETER INSERTION Right 09/30/2019   Procedure: INSERTION CENTRAL LINE ADULT;  Surgeon: Katha Cabal, MD;  Location: [facility];  Service: Vascular;  Laterality: Right;   COLONOSCOPY WITH PROPOFOL N/A 05/25/2017   Procedure: COLONOSCOPY WITH PROPOFOL;  Surgeon: Jonathon Bellows, MD;  Location: [facility] ENDOSCOPY;  Service: Gastroenterology;  Laterality: N/A;   COLONOSCOPY WITH PROPOFOL N/A 10/14/2017   Procedure: COLONOSCOPY WITH PROPOFOL;  Surgeon: Jonathon Bellows, MD;  Location:  [facility] ENDOSCOPY;  Service: Gastroenterology;  Laterality: N/A;   ESOPHAGOGASTRODUODENOSCOPY (EGD) WITH PROPOFOL N/A 05/25/2017   Procedure: ESOPHAGOGASTRODUODENOSCOPY (EGD) WITH PROPOFOL;  Surgeon: Jonathon Bellows, MD;  Location: [facility]OSCOPY;  Service: Gastroenterology;  Laterality: N/A;   ESOPHAGOGASTRODUODENOSCOPY (EGD) WITH PROPOFOL N/A 10/14/2017   Procedure: ESOPHAGOGASTRODUODENOSCOPY (EGD) WITH PROPOFOL;  Surgeon: Jonathon Bellows, MD;  Location: [facility] ENDOSCOPY;  Service: Gastroenterology;  Laterality: N/A;   ESOPHAGOGASTRODUODENOSCOPY (EGD) WITH PROPOFOL N/A 04/27/2020   Procedure: ESOPHAGOGASTRODUODENOSCOPY (EGD)  WITH PROPOFOL;  Surgeon: Lin Landsman, MD;  Location: [facility] ENDOSCOPY;  Service: Gastroenterology;  Laterality: N/A;   ESOPHAGOGASTRODUODENOSCOPY (EGD) WITH PROPOFOL N/A 06/18/2020   Procedure: ESOPHAGOGASTRODUODENOSCOPY (EGD) WITH PROPOFOL;  Surgeon: Lin Landsman, MD;  Location: [facility] ENDOSCOPY;  Service: Gastroenterology;  Laterality: N/A;   EYE SURGERY     GIVENS CAPSULE STUDY N/A 07/14/2017   Procedure: GIVENS CAPSULE STUDY;  Surgeon: Jonathon Bellows, MD;  Location: [facility] ENDOSCOPY;  Service: Gastroenterology;  Laterality: N/A;   GIVENS CAPSULE STUDY N/A 10/30/2017   Procedure: GIVENS CAPSULE STUDY 12 HR;  Surgeon: Jonathon Bellows, MD;  Location: [facility] ENDOSCOPY;  Service: Gastroenterology;  Laterality: N/A;   IVC FILTER INSERTION N/A 10/12/2017   Procedure: IVC FILTER INSERTION;  Surgeon: Algernon Huxley, MD;  Location: [place] CV LAB;  Service: Cardiovascular;  Laterality: N/A;   LOWER EXTREMITY ANGIOGRAPHY Right 10/04/2018   Procedure: LOWER EXTREMITY ANGIOGRAPHY;  Surgeon: Algernon Huxley, MD;  Location: [place] CV LAB;  Service: Cardiovascular;  Laterality: Right;   LOWER EXTREMITY ANGIOGRAPHY Right 03/21/2021   Procedure: LOWER EXTREMITY ANGIOGRAPHY;  Surgeon: Algernon Huxley, MD;  Location: [place] CV LAB;  Service: Cardiovascular;  Laterality: Right;   PERIPHERAL VASCULAR  CATHETERIZATION Left 01/18/2015   Procedure: Lower Extremity Angiography;  Surgeon: Algernon Huxley, MD;  Location: [place] CV LAB;  Service: Cardiovascular;  Laterality: Left;   PERIPHERAL VASCULAR CATHETERIZATION N/A 01/18/2015   Procedure: Lower Extremity Intervention;  Surgeon: Algernon Huxley, MD;  Location: [place] CV LAB;  Service: Cardiovascular;  Laterality: N/A;   PERIPHERAL VASCULAR CATHETERIZATION  07/30/2015   Procedure: Lower Extremity Intervention;  Surgeon: Algernon Huxley, MD;  Location: [place] CV LAB;  Service: Cardiovascular;;   PERIPHERAL VASCULAR CATHETERIZATION N/A 07/30/2015   Procedure: Abdominal Aortogram w/Lower Extremity;  Surgeon: Algernon Huxley, MD;  Location: [place] CV LAB;  Service: Cardiovascular;  Laterality: N/A;   PERIPHERAL VASCULAR CATHETERIZATION Left 08/22/2015   Procedure: Lower Extremity Angiography;  Surgeon: Algernon Huxley, MD;  Location: [place] CV LAB;  Service: Cardiovascular;  Laterality: Left;   PERIPHERAL VASCULAR CATHETERIZATION Left 08/22/2015   Procedure: Lower Extremity Intervention;  Surgeon: Algernon Huxley, MD;  Location: [place] CV LAB;  Service: Cardiovascular;  Laterality: Left;   PERIPHERAL VASCULAR CATHETERIZATION Right 03/31/2016   Procedure: Lower Extremity Angiography;  Surgeon: Algernon Huxley, MD;  Location: [place] CV LAB;  Service: Cardiovascular;  Laterality: Right;   PERIPHERAL VASCULAR CATHETERIZATION  03/31/2016   Procedure: Lower Extremity Intervention;  Surgeon: Algernon Huxley, MD;  Location: [place] CV LAB;  Service: Cardiovascular;;   PERIPHERAL VASCULAR CATHETERIZATION Left 04/10/2016   Procedure: Lower Extremity Angiography;  Surgeon: Algernon Huxley, MD;  Location: [place] CV LAB;  Service: Cardiovascular;  Laterality: Left;   VACUUM ASSISTED CLOSURE CHANGE Left 10/03/2019   Procedure: LEFT THIGH VACUUM ASSISTED CLOSURE CHANGE;  Surgeon: Algernon Huxley, MD;  Location: [facility];  Service: General;  Laterality:  Left;   WOUND DEBRIDEMENT Left 10/18/2015   Procedure: DEBRIDEMENT WOUND   ( LEFT BKA DEBRIDEMENT );  Surgeon: Algernon Huxley, MD;  Location: [facility];  Service: Vascular;  Laterality: Left;    Family History  Problem Relation Age of Onset   Heart attack Mother    Varicose Veins Neg Hx     No Known Allergies  CBC Latest Ref Rng & Units 03/22/2021 03/21/2021 12/10/2020  WBC 4.0 - 10.5 K/uL 14.6(H) 14.7(H) 12.7(H)  Hemoglobin  13.0 - 17.0 g/dL 14.3 15.3 15.4  Hematocrit 39.0 - 52.0 % 44.3 47.1 47.8  Platelets 150 - 400 K/uL 229 229 218      CMP     Component Value Date/Time   NA 141 03/21/2021 1550   NA 136 12/01/2013 1317   K 3.9 03/21/2021 1550   K 3.9 12/01/2013 1317   CL 108 03/21/2021 1550   CL 100 12/01/2013 1317   CO2 27 03/21/2021 1550   CO2 33 (H) 12/01/2013 1317   GLUCOSE 119 (H) 03/21/2021 1550   GLUCOSE 91 12/01/2013 1317   BUN 17 03/21/2021 1550   BUN 24 (H) 09/25/2014 0846   CREATININE 1.15 03/21/2021 1550   CREATININE 1.29 09/25/2014 0846   CALCIUM 8.9 03/21/2021 1550   CALCIUM 9.4 12/01/2013 1317   PROT 7.1 03/21/2021 1550   PROT 6.5 03/25/2013 0351   ALBUMIN 3.7 03/21/2021 1550   ALBUMIN 1.2 (L) 03/25/2013 0351   AST 16 03/21/2021 1550   AST 45 (H) 03/25/2013 0351   ALT 13 03/21/2021 1550   ALT 35 03/25/2013 0351   ALKPHOS 108 03/21/2021 1550   ALKPHOS 71 03/25/2013 0351   BILITOT 1.2 03/21/2021 1550   BILITOT 0.3 03/25/2013 0351   GFRNONAA >60 03/21/2021 1550   GFRNONAA 60 (L) 09/25/2014 0846   GFRNONAA >60 03/16/2014 0750   GFRAA >60 04/30/2020 0650   GFRAA >60 09/25/2014 0846   GFRAA >60 03/16/2014 0750     VAS [place] ABI WITH/WO TBI  Result Date: 05/17/2021  LOWER EXTREMITY DOPPLER STUDY Patient Name:  PRUDENCIO VELAZCO  Date of Exam:   05/15/2021 Medical Rec #: [id number]          Accession #:    [id number] Date of Birth: 1951-01-14          Patient Gender: M Patient Age:   18 years Exam Location:  [facility] Procedure:      VAS [place] ABI  WITH/WO TBI Referring Phys: Corene Cornea DEW --------------------------------------------------------------------------------  Indications: Peripheral artery disease, and Lt AKA.  Vascular Interventions: Multiple interventions 2014-17. Rt dis SFA-PTA stent                         10/04/2018 PTA left EIA. PTA right EIA. PTA right                         popliteal artery. PTA right poximal to mid ATA. PTA of                         right PTA from tibioperoneal trunk to foot. PTA of right                         peroneal artery and tibioperoneal trunk.                          03/21/2021: Aortogram and Selective Right Lower                         Extremity Angiogram. Mechanical Trhombectomy of the                         Right SFA, Popliteal Artery,Anterior Tibial Artery and  Peroneal Artery with penumbra CAT 6 device. PTA of the                         Right Anterior Tibial Artery with 2.5 mm diameter                         angioplasty balloon. PTA of the Right Peronael Artery                         with 3 mm diameter angioplasty balloon. PTA of the                         entire Right SFA and Popliteal Artery with multiple                         inflations with a 5 mm dismeter by 22 cm length Lutonix                         drug coated angioplasty balloon. Stent placementto the                         Right Proximal SFA with 6 mm diameter by 2.5 cm length                         Viabahn stent. Stent placement to the Right Popliteal                         Artery and Distal SFA with 6 mm diameter by 25 cm length                         Viabahn Stent. Comparison Study: 12/18/2020 Performing Technologist: Almira Coaster RVS  Examination Guidelines: A complete evaluation includes at minimum, Doppler waveform signals and systolic blood pressure reading at the level of bilateral brachial, anterior tibial, and posterior tibial arteries, when vessel segments are accessible. Bilateral testing is considered  an integral part of a complete examination. Photoelectric Plethysmograph (PPG) waveforms and toe systolic pressure readings are included as required and additional duplex testing as needed. Limited examinations for reoccurring indications may be performed as noted.  ABI Findings: +---------+------------------+-----+--------+--------+ Right    Rt Pressure (mmHg)IndexWaveformComment  +---------+------------------+-----+--------+--------+ Brachial 145                                     +---------+------------------+-----+--------+--------+ ATA      46                             .32      +---------+------------------+-----+--------+--------+ PTA      61                0.42                  +---------+------------------+-----+--------+--------+ Great Toe0                 0.00 Absent           +---------+------------------+-----+--------+--------+ +--------+------------------+-----+--------+-------+ Left    Lt Pressure (mmHg)IndexWaveformComment +--------+------------------+-----+--------+-------+ [id number]                                    +--------+------------------+-----+--------+-------+ +-------+-----------+-----------+------------+------------+  ABI/TBIToday's ABIToday's TBIPrevious ABIPrevious TBI +-------+-----------+-----------+------------+------------+ Right  .42                   .69                      +-------+-----------+-----------+------------+------------+ Right ABIs appear decreased compared to prior study on 12/18/2020.  Summary: Right: Resting right ankle-brachial index indicates severe right lower extremity arterial disease. The right toe-brachial index is abnormal.  *See table(s) above for measurements and observations.  Electronically signed by [name] MD on 05/17/2021 at 9:05:13 AM.    Final        Assessment & Plan:   1. PAD (peripheral artery disease) (HCC) Recommend:  The patient has evidence of severe atherosclerotic changes  of both lower extremities with rest pain that is associated with preulcerative changes and impending tissue loss of the foot.  This represents a limb threatening ischemia and places the patient at the risk for limb loss.  Patient should undergo angiography of the lower extremities with the hope for intervention for limb salvage.  The risks and benefits as well as the alternative therapies was discussed in detail with the patient.  All questions were answered.  Patient agrees to proceed with angiography.  The patient will follow up with me in the office after the procedure.       2. Essential hypertension Continue antihypertensive medications as already ordered, these medications have been reviewed and there are no changes at this time.   3. Hyperlipidemia, unspecified hyperlipidemia type Continue statin as ordered and reviewed, no changes at this time    Current Outpatient Medications on File Prior to Visit  Medication Sig Dispense Refill   acetaminophen (TYLENOL) 325 MG tablet Take 650 mg by mouth every 4 (four) hours as needed for fever.      acidophilus (RISAQUAD) CAPS capsule Take 1 capsule by mouth 2 (two) times daily.     ammonium lactate (AMLACTIN) 12 % cream Apply topically 2 (two) times daily. Apply to right foot     apixaban (ELIQUIS) 2.5 MG TABS tablet Take 1 tablet (2.5 mg total) by mouth 2 (two) times daily. 60 tablet 3   ascorbic acid (VITAMIN C) 500 MG tablet Take 500 mg by mouth 2 (two) times daily.     aspirin EC 81 MG EC tablet Take 1 tablet (81 mg total) by mouth daily. Swallow whole. 90 tablet 3   baclofen (LIORESAL) 10 MG tablet Take 10 mg by mouth 3 (three) times daily. Hold for sedation     brimonidine-timolol (COMBIGAN) 0.2-0.5 % ophthalmic solution Place 1 drop into the right eye 2 (two) times daily.     Difluprednate (DUREZOL) 0.05 % EMUL Apply 1 drop to eye daily.     docusate sodium (COLACE) 100 MG capsule Take 100 mg by mouth daily as needed for mild  constipation.     fentaNYL (DURAGESIC) 25 MCG/HR Place 1 patch onto the skin every 3 (three) days.      ferrous sulfate 324 (65 Fe) MG TBEC Take 324 mg by mouth 2 (two) times daily.      furosemide (LASIX) 40 MG tablet Take 1 tablet (40 mg total) by mouth daily. 30 tablet    gabapentin (NEURONTIN) 100 MG capsule Take 100 mg by mouth at bedtime.      guaiFENesin (ROBITUSSIN) 100 MG/5ML SOLN Take 15 mLs by mouth 3 (three) times daily as needed for cough (congestion).     ipratropium-albuterol (DUONEB)  0.5-2.5 (3) MG/3ML SOLN Take by nebulization 3 (three) times daily as needed.     ketorolac (ACULAR) 0.5 % ophthalmic solution Place 1 drop into the left eye 2 (two) times daily.     loperamide (IMODIUM) 2 MG capsule Take 2 mg by mouth 3 (three) times daily as needed for diarrhea or loose stools.     Melatonin 5 MG TABS Take 5 mg by mouth at bedtime as needed (insomnia).      Morphine Sulfate (MORPHINE CONCENTRATE) 10 mg / 0.5 ml concentrated solution      nitroGLYCERIN (NITROSTAT) 0.4 MG SL tablet Place 0.14 mg under the tongue every 5 (five) minutes as needed for chest pain.     Omega-3 Fatty Acids (FISH OIL) 1000 MG CAPS Take 1,000 mg by mouth daily.      Oxycodone HCl 10 MG TABS Take 10 mg by mouth every 4 (four) hours as needed (pain).      oxymetazoline (AFRIN) 0.05 % nasal spray Place 2 sprays into both nostrils 2 (two) times daily as needed (For nose bleeds).      potassium chloride SA (K-DUR,KLOR-CON) 20 MEQ tablet Take 20 mEq by mouth daily. With or after meal     promethazine (PHENERGAN) 25 MG/ML injection      sodium chloride (OCEAN) 0.65 % SOLN nasal spray Place 2 sprays into both nostrils 2 (two) times daily as needed for congestion.     tamsulosin (FLOMAX) 0.4 MG CAPS capsule Take 1 capsule (0.4 mg total) by mouth daily. 30 capsule 0   TRELEGY ELLIPTA 100-62.5-25 MCG/INH AEPB Inhale 1 puff into the lungs every morning.      triamcinolone cream (KENALOG) 0.1 % Apply 1 application  topically 2 (two) times daily.     atorvastatin (LIPITOR) 10 MG tablet Take 1 tablet (10 mg total) by mouth daily. (Patient taking differently: Take 10 mg by mouth at bedtime.) 30 tablet 11   pantoprazole (PROTONIX) 40 MG tablet Take 1 tablet (40 mg total) by mouth 2 (two) times daily. 60 tablet 0   No current facility-administered medications on file prior to visit.    There are no Patient Instructions on file for this visit. No follow-ups on file.   Kris Hartmann, NP

## 2021-05-19 NOTE — Progress Notes (Signed)
Subjective:    Patient ID: Timothy Juarez, male    DOB: 1951/07/24, 70 y.o.   MRN: [id number] Chief Complaint  Patient presents with   Follow-up    [facility] 1 Mo FU ischemic leg    Timothy Juarez 70 year old male that returns to the office for followup and review of the noninvasive studies. There has been a significant deterioration in the lower extremity symptoms.  The patient notes interval shortening of their claudication distance and development of mild rest pain symptoms. No new ulcers or wounds have occurred since the last visit.  Patient underwent extensive intervention on 03/21/2021 when he had occlusion from his common femoral artery and down.  There have been no significant changes to the patient's overall health care.  The patient denies amaurosis fugax or recent TIA symptoms. There are no recent neurological changes noted. The patient denies history of DVT, PE or superficial thrombophlebitis. The patient denies recent episodes of angina or shortness of breath.   ABI's Rt=0.42 and Lt=n/a (previous ABI's Rt=0.69 and Lt=n/a) Duplex US of the lower extremity arterial system shows monophasic waveforms in the right lower extremity with flat toe waveforms.   Review of Systems  Musculoskeletal:  Positive for gait problem.  Neurological:  Positive for weakness.  All other systems reviewed and are negative.     Objective:   Physical Exam Vitals reviewed.  HENT:     Head: Normocephalic.  Cardiovascular:     Rate and Rhythm: Normal rate.     Pulses:          Dorsalis pedis pulses are 0 on the right side.       Posterior tibial pulses are 0 on the right side.  Pulmonary:     Effort: Pulmonary effort is normal.  Musculoskeletal:     Left Lower Extremity: Left leg is amputated above knee.  Skin:    General: Skin is dry.  Neurological:     Mental Status: He is alert and oriented to person, place, and time.     Motor: Weakness present.  Psychiatric:        Mood and Affect:  Mood normal.        Behavior: Behavior normal.        Thought Content: Thought content normal.        Judgment: Judgment normal.    BP (!) 150/76   Pulse 69   Ht 6\' 1"  (1.854 m)   Wt 224 lb (101.6 kg)   BMI 29.55 kg/m   Past Medical History:  Diagnosis Date   Acute embolism and thombos unsp deep vn unsp lower extremity (HCC)    Acute respiratory failure (HCC)    AKI (acute kidney injury) ([place]) 12/27/2018   Allergy    Anemia    Aneurysm of unspecified site ([place])    ARF (acute respiratory failure) (HCC)    H/O   Bronchitis    Cellulitis and abscess of leg 09/27/2019   CHF (congestive heart failure) (HCC)    COPD (chronic obstructive pulmonary disease) (HCC)    Cough    Epistaxis    Gastrointestinal hemorrhage    GERD (gastroesophageal reflux disease)    Gout    Hematemesis 04/27/2020   High anion gap metabolic acidosis 7/98/9211   Hyperlipidemia    Hypertension    Hypokalemia    Infection of above knee amputation stump ([place]) 09/27/2019   Insomnia    Iron deficiency anemia 04/21/2017   Muscle contracture    MUSCLE SPASMS, muscle  weakness   Peripheral vascular disease (HCC)    Pneumonia    Pressure ulcer    Severe sepsis ([place]) 12/27/2018   Toxic encephalopathy 12/27/2018    Social History   Socioeconomic History   Marital status: Legally Separated    Spouse name: Not on file   Number of children: Not on file   Years of education: Not on file   Highest education level: Not on file  Occupational History   Not on file  Tobacco Use   Smoking status: Former    Packs/day: 1.00    Years: 44.00    Pack years: 44.00    Types: Cigarettes    Quit date: 11/17/2012    Years since quitting: 8.5   Smokeless tobacco: Never  Vaping Use   Vaping Use: Never used  Substance and Sexual Activity   Alcohol use: Not Currently   Drug use: Never   Sexual activity: Never  Other Topics Concern   Not on file  Social History Narrative   Not on file   Social Determinants of Health    Financial Resource Strain: Not on file  Food Insecurity: Not on file  Transportation Needs: Not on file  Physical Activity: Not on file  Stress: Not on file  Social Connections: Not on file  Intimate Partner Violence: Not on file    Past Surgical History:  Procedure Laterality Date   AMPUTATION Left 09/19/2015   Procedure: AMPUTATION BELOW KNEE;  Surgeon: Algernon Huxley, MD;  Location: [facility];  Service: Vascular;  Laterality: Left;   AMPUTATION Left 11/01/2015   Procedure: AMPUTATION ABOVE KNEE;  Surgeon: Algernon Huxley, MD;  Location: [facility];  Service: Vascular;  Laterality: Left;   AMPUTATION Left 09/29/2019   Procedure: IRRIGATION AND DEBRIDEMENT OF LEFT AKA;  Surgeon: Algernon Huxley, MD;  Location: [facility];  Service: General;  Laterality: Left;   APPLICATION OF WOUND VAC Left 10/18/2015   Procedure: APPLICATION OF WOUND VAC;  Surgeon: Algernon Huxley, MD;  Location: [facility];  Service: Vascular;  Laterality: Left;   APPLICATION OF WOUND VAC Left 09/30/2019   Procedure: APPLICATION OF WOUND VAC;  Surgeon: Katha Cabal, MD;  Location: [facility];  Service: Vascular;  Laterality: Left;  serial # [id number]   [id number] CATHETERIZATION     CATARACT EXTRACTION W/PHACO Right 08/18/2019   Procedure: CATARACT EXTRACTION PHACO AND INTRAOCULAR LENS PLACEMENT ([place]) RIGHT;  Surgeon: Birder Robson, MD;  Location: [facility];  Service: Ophthalmology;  Laterality: Right;  [place] 03:29.0 CDE 45.68 Fluid Pack Lot # [id number]   CENTRAL VENOUS CATHETER INSERTION Right 09/30/2019   Procedure: INSERTION CENTRAL LINE ADULT;  Surgeon: Katha Cabal, MD;  Location: [facility];  Service: Vascular;  Laterality: Right;   COLONOSCOPY WITH PROPOFOL N/A 05/25/2017   Procedure: COLONOSCOPY WITH PROPOFOL;  Surgeon: Jonathon Bellows, MD;  Location: [facility] ENDOSCOPY;  Service: Gastroenterology;  Laterality: N/A;   COLONOSCOPY WITH PROPOFOL N/A 10/14/2017   Procedure: COLONOSCOPY WITH PROPOFOL;  Surgeon: Jonathon Bellows, MD;  Location:  [facility] ENDOSCOPY;  Service: Gastroenterology;  Laterality: N/A;   ESOPHAGOGASTRODUODENOSCOPY (EGD) WITH PROPOFOL N/A 05/25/2017   Procedure: ESOPHAGOGASTRODUODENOSCOPY (EGD) WITH PROPOFOL;  Surgeon: Jonathon Bellows, MD;  Location: [facility] ENDOSCOPY;  Service: Gastroenterology;  Laterality: N/A;   ESOPHAGOGASTRODUODENOSCOPY (EGD) WITH PROPOFOL N/A 10/14/2017   Procedure: ESOPHAGOGASTRODUODENOSCOPY (EGD) WITH PROPOFOL;  Surgeon: Jonathon Bellows, MD;  Location: [facility] ENDOSCOPY;  Service: Gastroenterology;  Laterality: N/A;   ESOPHAGOGASTRODUODENOSCOPY (EGD) WITH PROPOFOL N/A 04/27/2020   Procedure: ESOPHAGOGASTRODUODENOSCOPY (EGD)  WITH PROPOFOL;  Surgeon: Lin Landsman, MD;  Location: [facility] ENDOSCOPY;  Service: Gastroenterology;  Laterality: N/A;   ESOPHAGOGASTRODUODENOSCOPY (EGD) WITH PROPOFOL N/A 06/18/2020   Procedure: ESOPHAGOGASTRODUODENOSCOPY (EGD) WITH PROPOFOL;  Surgeon: Lin Landsman, MD;  Location: [facility] ENDOSCOPY;  Service: Gastroenterology;  Laterality: N/A;   EYE SURGERY     GIVENS CAPSULE STUDY N/A 07/14/2017   Procedure: GIVENS CAPSULE STUDY;  Surgeon: Jonathon Bellows, MD;  Location: [facility] ENDOSCOPY;  Service: Gastroenterology;  Laterality: N/A;   GIVENS CAPSULE STUDY N/A 10/30/2017   Procedure: GIVENS CAPSULE STUDY 12 HR;  Surgeon: Jonathon Bellows, MD;  Location: [facility] ENDOSCOPY;  Service: Gastroenterology;  Laterality: N/A;   IVC FILTER INSERTION N/A 10/12/2017   Procedure: IVC FILTER INSERTION;  Surgeon: Algernon Huxley, MD;  Location: [place] CV LAB;  Service: Cardiovascular;  Laterality: N/A;   LOWER EXTREMITY ANGIOGRAPHY Right 10/04/2018   Procedure: LOWER EXTREMITY ANGIOGRAPHY;  Surgeon: Algernon Huxley, MD;  Location: [place] CV LAB;  Service: Cardiovascular;  Laterality: Right;   LOWER EXTREMITY ANGIOGRAPHY Right 03/21/2021   Procedure: LOWER EXTREMITY ANGIOGRAPHY;  Surgeon: Algernon Huxley, MD;  Location: [place] CV LAB;  Service: Cardiovascular;  Laterality: Right;   PERIPHERAL VASCULAR  CATHETERIZATION Left 01/18/2015   Procedure: Lower Extremity Angiography;  Surgeon: Algernon Huxley, MD;  Location: [place] CV LAB;  Service: Cardiovascular;  Laterality: Left;   PERIPHERAL VASCULAR CATHETERIZATION N/A 01/18/2015   Procedure: Lower Extremity Intervention;  Surgeon: Algernon Huxley, MD;  Location: [place] CV LAB;  Service: Cardiovascular;  Laterality: N/A;   PERIPHERAL VASCULAR CATHETERIZATION  07/30/2015   Procedure: Lower Extremity Intervention;  Surgeon: Algernon Huxley, MD;  Location: [place] CV LAB;  Service: Cardiovascular;;   PERIPHERAL VASCULAR CATHETERIZATION N/A 07/30/2015   Procedure: Abdominal Aortogram w/Lower Extremity;  Surgeon: Algernon Huxley, MD;  Location: [place] CV LAB;  Service: Cardiovascular;  Laterality: N/A;   PERIPHERAL VASCULAR CATHETERIZATION Left 08/22/2015   Procedure: Lower Extremity Angiography;  Surgeon: Algernon Huxley, MD;  Location: [place] CV LAB;  Service: Cardiovascular;  Laterality: Left;   PERIPHERAL VASCULAR CATHETERIZATION Left 08/22/2015   Procedure: Lower Extremity Intervention;  Surgeon: Algernon Huxley, MD;  Location: [place] CV LAB;  Service: Cardiovascular;  Laterality: Left;   PERIPHERAL VASCULAR CATHETERIZATION Right 03/31/2016   Procedure: Lower Extremity Angiography;  Surgeon: Algernon Huxley, MD;  Location: [place] CV LAB;  Service: Cardiovascular;  Laterality: Right;   PERIPHERAL VASCULAR CATHETERIZATION  03/31/2016   Procedure: Lower Extremity Intervention;  Surgeon: Algernon Huxley, MD;  Location: [place] CV LAB;  Service: Cardiovascular;;   PERIPHERAL VASCULAR CATHETERIZATION Left 04/10/2016   Procedure: Lower Extremity Angiography;  Surgeon: Algernon Huxley, MD;  Location: [place] CV LAB;  Service: Cardiovascular;  Laterality: Left;   VACUUM ASSISTED CLOSURE CHANGE Left 10/03/2019   Procedure: LEFT THIGH VACUUM ASSISTED CLOSURE CHANGE;  Surgeon: Algernon Huxley, MD;  Location: [facility];  Service: General;  Laterality:  Left;   WOUND DEBRIDEMENT Left 10/18/2015   Procedure: DEBRIDEMENT WOUND   ( LEFT BKA DEBRIDEMENT );  Surgeon: Algernon Huxley, MD;  Location: [facility];  Service: Vascular;  Laterality: Left;    Family History  Problem Relation Age of Onset   Heart attack Mother    Varicose Veins Neg Hx     No Known Allergies  CBC Latest Ref Rng & Units 03/22/2021 03/21/2021 12/10/2020  WBC 4.0 - 10.5 K/uL 14.6(H) 14.7(H) 12.7(H)  Hemoglobin  13.0 - 17.0 g/dL 14.3 15.3 15.4  Hematocrit 39.0 - 52.0 % 44.3 47.1 47.8  Platelets 150 - 400 K/uL 229 229 218      CMP     Component Value Date/Time   NA 141 03/21/2021 1550   NA 136 12/01/2013 1317   K 3.9 03/21/2021 1550   K 3.9 12/01/2013 1317   CL 108 03/21/2021 1550   CL 100 12/01/2013 1317   CO2 27 03/21/2021 1550   CO2 33 (H) 12/01/2013 1317   GLUCOSE 119 (H) 03/21/2021 1550   GLUCOSE 91 12/01/2013 1317   BUN 17 03/21/2021 1550   BUN 24 (H) 09/25/2014 0846   CREATININE 1.15 03/21/2021 1550   CREATININE 1.29 09/25/2014 0846   CALCIUM 8.9 03/21/2021 1550   CALCIUM 9.4 12/01/2013 1317   PROT 7.1 03/21/2021 1550   PROT 6.5 03/25/2013 0351   ALBUMIN 3.7 03/21/2021 1550   ALBUMIN 1.2 (L) 03/25/2013 0351   AST 16 03/21/2021 1550   AST 45 (H) 03/25/2013 0351   ALT 13 03/21/2021 1550   ALT 35 03/25/2013 0351   ALKPHOS 108 03/21/2021 1550   ALKPHOS 71 03/25/2013 0351   BILITOT 1.2 03/21/2021 1550   BILITOT 0.3 03/25/2013 0351   GFRNONAA >60 03/21/2021 1550   GFRNONAA 60 (L) 09/25/2014 0846   GFRNONAA >60 03/16/2014 0750   GFRAA >60 04/30/2020 0650   GFRAA >60 09/25/2014 0846   GFRAA >60 03/16/2014 0750     VAS [place] ABI WITH/WO TBI  Result Date: 05/17/2021  LOWER EXTREMITY DOPPLER STUDY Patient Name:  VU LIEBMAN  Date of Exam:   05/15/2021 Medical Rec #: [id number]          Accession #:    [id number] Date of Birth: 1951-06-30          Patient Gender: M Patient Age:   21 years Exam Location:  [facility] Procedure:      VAS [place] ABI  WITH/WO TBI Referring Phys: Corene Cornea DEW --------------------------------------------------------------------------------  Indications: Peripheral artery disease, and Lt AKA.  Vascular Interventions: Multiple interventions 2014-17. Rt dis SFA-PTA stent                         10/04/2018 PTA left EIA. PTA right EIA. PTA right                         popliteal artery. PTA right poximal to mid ATA. PTA of                         right PTA from tibioperoneal trunk to foot. PTA of right                         peroneal artery and tibioperoneal trunk.                          03/21/2021: Aortogram and Selective Right Lower                         Extremity Angiogram. Mechanical Trhombectomy of the                         Right SFA, Popliteal Artery,Anterior Tibial Artery and  Peroneal Artery with penumbra CAT 6 device. PTA of the                         Right Anterior Tibial Artery with 2.5 mm diameter                         angioplasty balloon. PTA of the Right Peronael Artery                         with 3 mm diameter angioplasty balloon. PTA of the                         entire Right SFA and Popliteal Artery with multiple                         inflations with a 5 mm dismeter by 22 cm length Lutonix                         drug coated angioplasty balloon. Stent placementto the                         Right Proximal SFA with 6 mm diameter by 2.5 cm length                         Viabahn stent. Stent placement to the Right Popliteal                         Artery and Distal SFA with 6 mm diameter by 25 cm length                         Viabahn Stent. Comparison Study: 12/18/2020 Performing Technologist: Almira Coaster RVS  Examination Guidelines: A complete evaluation includes at minimum, Doppler waveform signals and systolic blood pressure reading at the level of bilateral brachial, anterior tibial, and posterior tibial arteries, when vessel segments are accessible. Bilateral testing is considered  an integral part of a complete examination. Photoelectric Plethysmograph (PPG) waveforms and toe systolic pressure readings are included as required and additional duplex testing as needed. Limited examinations for reoccurring indications may be performed as noted.  ABI Findings: +---------+------------------+-----+--------+--------+ Right    Rt Pressure (mmHg)IndexWaveformComment  +---------+------------------+-----+--------+--------+ Brachial 145                                     +---------+------------------+-----+--------+--------+ ATA      46                             .32      +---------+------------------+-----+--------+--------+ PTA      61                0.42                  +---------+------------------+-----+--------+--------+ Great Toe0                 0.00 Absent           +---------+------------------+-----+--------+--------+ +--------+------------------+-----+--------+-------+ Left    Lt Pressure (mmHg)IndexWaveformComment +--------+------------------+-----+--------+-------+ [id number]                                    +--------+------------------+-----+--------+-------+ +-------+-----------+-----------+------------+------------+  ABI/TBIToday's ABIToday's TBIPrevious ABIPrevious TBI +-------+-----------+-----------+------------+------------+ Right  .42                   .69                      +-------+-----------+-----------+------------+------------+ Right ABIs appear decreased compared to prior study on 12/18/2020.  Summary: Right: Resting right ankle-brachial index indicates severe right lower extremity arterial disease. The right toe-brachial index is abnormal.  *See table(s) above for measurements and observations.  Electronically signed by [name] MD on 05/17/2021 at 9:05:13 AM.    Final        Assessment & Plan:   1. PAD (peripheral artery disease) (HCC) Recommend:  The patient has evidence of severe atherosclerotic changes  of both lower extremities with rest pain that is associated with preulcerative changes and impending tissue loss of the foot.  This represents a limb threatening ischemia and places the patient at the risk for limb loss.  Patient should undergo angiography of the lower extremities with the hope for intervention for limb salvage.  The risks and benefits as well as the alternative therapies was discussed in detail with the patient.  All questions were answered.  Patient agrees to proceed with angiography.  The patient will follow up with me in the office after the procedure.       2. Essential hypertension Continue antihypertensive medications as already ordered, these medications have been reviewed and there are no changes at this time.   3. Hyperlipidemia, unspecified hyperlipidemia type Continue statin as ordered and reviewed, no changes at this time    Current Outpatient Medications on File Prior to Visit  Medication Sig Dispense Refill   acetaminophen (TYLENOL) 325 MG tablet Take 650 mg by mouth every 4 (four) hours as needed for fever.      acidophilus (RISAQUAD) CAPS capsule Take 1 capsule by mouth 2 (two) times daily.     ammonium lactate (AMLACTIN) 12 % cream Apply topically 2 (two) times daily. Apply to right foot     apixaban (ELIQUIS) 2.5 MG TABS tablet Take 1 tablet (2.5 mg total) by mouth 2 (two) times daily. 60 tablet 3   ascorbic acid (VITAMIN C) 500 MG tablet Take 500 mg by mouth 2 (two) times daily.     aspirin EC 81 MG EC tablet Take 1 tablet (81 mg total) by mouth daily. Swallow whole. 90 tablet 3   baclofen (LIORESAL) 10 MG tablet Take 10 mg by mouth 3 (three) times daily. Hold for sedation     brimonidine-timolol (COMBIGAN) 0.2-0.5 % ophthalmic solution Place 1 drop into the right eye 2 (two) times daily.     Difluprednate (DUREZOL) 0.05 % EMUL Apply 1 drop to eye daily.     docusate sodium (COLACE) 100 MG capsule Take 100 mg by mouth daily as needed for mild  constipation.     fentaNYL (DURAGESIC) 25 MCG/HR Place 1 patch onto the skin every 3 (three) days.      ferrous sulfate 324 (65 Fe) MG TBEC Take 324 mg by mouth 2 (two) times daily.      furosemide (LASIX) 40 MG tablet Take 1 tablet (40 mg total) by mouth daily. 30 tablet    gabapentin (NEURONTIN) 100 MG capsule Take 100 mg by mouth at bedtime.      guaiFENesin (ROBITUSSIN) 100 MG/5ML SOLN Take 15 mLs by mouth 3 (three) times daily as needed for cough (congestion).     ipratropium-albuterol (DUONEB)  0.5-2.5 (3) MG/3ML SOLN Take by nebulization 3 (three) times daily as needed.     ketorolac (ACULAR) 0.5 % ophthalmic solution Place 1 drop into the left eye 2 (two) times daily.     loperamide (IMODIUM) 2 MG capsule Take 2 mg by mouth 3 (three) times daily as needed for diarrhea or loose stools.     Melatonin 5 MG TABS Take 5 mg by mouth at bedtime as needed (insomnia).      Morphine Sulfate (MORPHINE CONCENTRATE) 10 mg / 0.5 ml concentrated solution      nitroGLYCERIN (NITROSTAT) 0.4 MG SL tablet Place 0.14 mg under the tongue every 5 (five) minutes as needed for chest pain.     Omega-3 Fatty Acids (FISH OIL) 1000 MG CAPS Take 1,000 mg by mouth daily.      Oxycodone HCl 10 MG TABS Take 10 mg by mouth every 4 (four) hours as needed (pain).      oxymetazoline (AFRIN) 0.05 % nasal spray Place 2 sprays into both nostrils 2 (two) times daily as needed (For nose bleeds).      potassium chloride SA (K-DUR,KLOR-CON) 20 MEQ tablet Take 20 mEq by mouth daily. With or after meal     promethazine (PHENERGAN) 25 MG/ML injection      sodium chloride (OCEAN) 0.65 % SOLN nasal spray Place 2 sprays into both nostrils 2 (two) times daily as needed for congestion.     tamsulosin (FLOMAX) 0.4 MG CAPS capsule Take 1 capsule (0.4 mg total) by mouth daily. 30 capsule 0   TRELEGY ELLIPTA 100-62.5-25 MCG/INH AEPB Inhale 1 puff into the lungs every morning.      triamcinolone cream (KENALOG) 0.1 % Apply 1 application  topically 2 (two) times daily.     atorvastatin (LIPITOR) 10 MG tablet Take 1 tablet (10 mg total) by mouth daily. (Patient taking differently: Take 10 mg by mouth at bedtime.) 30 tablet 11   pantoprazole (PROTONIX) 40 MG tablet Take 1 tablet (40 mg total) by mouth 2 (two) times daily. 60 tablet 0   No current facility-administered medications on file prior to visit.    There are no Patient Instructions on file for this visit. No follow-ups on file.   Kris Hartmann, NP

## 2021-05-20 ENCOUNTER — Encounter: Admission: RE | Payer: Self-pay | Source: Home / Self Care

## 2021-05-20 ENCOUNTER — Encounter (INDEPENDENT_AMBULATORY_CARE_PROVIDER_SITE_OTHER): Payer: Self-pay | Admitting: Nurse Practitioner

## 2021-05-20 ENCOUNTER — Ambulatory Visit: Admission: RE | Admit: 2021-05-20 | Payer: Medicare Other | Source: Home / Self Care | Admitting: Vascular Surgery

## 2021-05-20 DIAGNOSIS — I70229 Atherosclerosis of native arteries of extremities with rest pain, unspecified extremity: Secondary | ICD-10-CM

## 2021-05-20 SURGERY — LOWER EXTREMITY ANGIOGRAPHY
Anesthesia: Moderate Sedation | Site: Leg Lower | Laterality: Right

## 2021-05-21 ENCOUNTER — Encounter (INDEPENDENT_AMBULATORY_CARE_PROVIDER_SITE_OTHER): Payer: Self-pay

## 2021-05-21 ENCOUNTER — Telehealth (INDEPENDENT_AMBULATORY_CARE_PROVIDER_SITE_OTHER): Payer: Self-pay

## 2021-05-21 NOTE — Telephone Encounter (Signed)
Lenda Kelp from [facility] called stating that paperwork for procedure calls for paper to stop blood thinner and patient has taking his morning pill. I spoke with Dr Lucky Cowboy and advise patient can stop today due to schedule procedure on 05/23/21. Lenda Kelp was made aware with medical advice,

## 2021-05-21 NOTE — Telephone Encounter (Signed)
Patient was reschedule for right lower extremity angiogram with Dr Lucky Cowboy on 05/23/21 arrival time 8:15 am at the [facility]. I spoke with Olivia Mackie with [facility] and she is aware with procedure date. Per Olivia Mackie pre-procedure instructions will faxed to her attention at [phone].

## 2021-05-23 ENCOUNTER — Other Ambulatory Visit: Payer: Self-pay

## 2021-05-23 ENCOUNTER — Encounter: Payer: Self-pay | Admitting: Vascular Surgery

## 2021-05-23 ENCOUNTER — Ambulatory Visit
Admission: RE | Admit: 2021-05-23 | Discharge: 2021-05-23 | Disposition: A | Discharge status: Skilled Nursing Facility | Payer: Medicare Other | Source: Ambulatory Visit | Attending: Vascular Surgery | Admitting: Vascular Surgery

## 2021-05-23 ENCOUNTER — Encounter
Admission: RE | Disposition: A | Discharge status: Skilled Nursing Facility | Payer: Self-pay | Source: Ambulatory Visit | Attending: Vascular Surgery

## 2021-05-23 DIAGNOSIS — I70223 Atherosclerosis of native arteries of extremities with rest pain, bilateral legs: Secondary | ICD-10-CM | POA: Insufficient documentation

## 2021-05-23 DIAGNOSIS — Z7901 Long term (current) use of anticoagulants: Secondary | ICD-10-CM | POA: Diagnosis not present

## 2021-05-23 DIAGNOSIS — Z79899 Other long term (current) drug therapy: Secondary | ICD-10-CM | POA: Insufficient documentation

## 2021-05-23 DIAGNOSIS — I70229 Atherosclerosis of native arteries of extremities with rest pain, unspecified extremity: Secondary | ICD-10-CM

## 2021-05-23 DIAGNOSIS — I10 Essential (primary) hypertension: Secondary | ICD-10-CM | POA: Diagnosis not present

## 2021-05-23 DIAGNOSIS — I70221 Atherosclerosis of native arteries of extremities with rest pain, right leg: Secondary | ICD-10-CM

## 2021-05-23 DIAGNOSIS — Z87891 Personal history of nicotine dependence: Secondary | ICD-10-CM | POA: Insufficient documentation

## 2021-05-23 DIAGNOSIS — E785 Hyperlipidemia, unspecified: Secondary | ICD-10-CM | POA: Diagnosis not present

## 2021-05-23 DIAGNOSIS — Z7982 Long term (current) use of aspirin: Secondary | ICD-10-CM | POA: Insufficient documentation

## 2021-05-23 HISTORY — PX: LOWER EXTREMITY ANGIOGRAPHY: CATH118251

## 2021-05-23 LAB — CREATININE, SERUM
Creatinine, Ser: 1.18 mg/dL (ref 0.61–1.24)
GFR, Estimated: >60 mL/min (ref >60)

## 2021-05-23 LAB — BUN: BUN: 19 mg/dL (ref 8–23)

## 2021-05-23 SURGERY — LOWER EXTREMITY ANGIOGRAPHY
Anesthesia: Moderate Sedation | Site: Leg Lower | Laterality: Right

## 2021-05-23 MED ORDER — ONDANSETRON HCL 4 MG/2ML IJ SOLN: 4.0000 mg | Freq: Four times a day (QID) | INTRAMUSCULAR | Status: DC | PRN

## 2021-05-23 MED ORDER — SODIUM CHLORIDE 0.9 % IV SOLN: 250.0000 mL | INTRAVENOUS | Status: DC | PRN

## 2021-05-23 MED ORDER — SODIUM CHLORIDE 0.9% FLUSH: 3.0000 mL | INTRAVENOUS | Status: DC | PRN

## 2021-05-23 MED ORDER — HEPARIN SODIUM (PORCINE) 1000 UNIT/ML IJ SOLN
INTRAMUSCULAR | Status: DC | PRN
  Administered 2021-05-23: 5000 [IU] via INTRAVENOUS

## 2021-05-23 MED ORDER — LABETALOL HCL 5 MG/ML IV SOLN
10.0000 mg | INTRAVENOUS | Status: DC | PRN
Start: 2021-05-23 — End: 2021-05-23

## 2021-05-23 MED ORDER — MIDAZOLAM HCL 2 MG/2ML IJ SOLN
INTRAMUSCULAR | Status: DC | PRN
  Administered 2021-05-23: .5 mg via INTRAVENOUS
  Administered 2021-05-23: 1 mg via INTRAVENOUS
  Administered 2021-05-23: .5 mg via INTRAVENOUS
  Administered 2021-05-23 (×2): 1 mg via INTRAVENOUS
  Administered 2021-05-23: 2 mg via INTRAVENOUS
  Administered 2021-05-23: 1 mg via INTRAVENOUS

## 2021-05-23 MED ORDER — FENTANYL CITRATE (PF) 100 MCG/2ML IJ SOLN
INTRAMUSCULAR | Status: DC | PRN
  Administered 2021-05-23 (×2): 25 ug via INTRAVENOUS
  Administered 2021-05-23 (×4): 12.5 ug via INTRAVENOUS
  Administered 2021-05-23: 50 ug via INTRAVENOUS

## 2021-05-23 MED ORDER — FENTANYL CITRATE PF 50 MCG/ML IJ SOSY
PREFILLED_SYRINGE | INTRAMUSCULAR | Status: AC
  Filled 2021-05-23: qty 1

## 2021-05-23 MED ORDER — SODIUM CHLORIDE 0.9 % IV SOLN: INTRAVENOUS | Status: DC

## 2021-05-23 MED ORDER — METHYLPREDNISOLONE SODIUM SUCC 125 MG IJ SOLR: 125.0000 mg | Freq: Once | INTRAMUSCULAR | Status: DC | PRN

## 2021-05-23 MED ORDER — CEFAZOLIN SODIUM-DEXTROSE 2-4 GM/100ML-% IV SOLN
INTRAVENOUS | Status: AC
  Administered 2021-05-23: 2 g via INTRAVENOUS
  Filled 2021-05-23: qty 100

## 2021-05-23 MED ORDER — FENTANYL CITRATE PF 50 MCG/ML IJ SOSY
PREFILLED_SYRINGE | INTRAMUSCULAR | Status: AC
  Filled 2021-05-23: qty 2

## 2021-05-23 MED ORDER — FAMOTIDINE 20 MG PO TABS: 40.0000 mg | ORAL_TABLET | Freq: Once | ORAL | Status: DC | PRN

## 2021-05-23 MED ORDER — HYDRALAZINE HCL 20 MG/ML IJ SOLN
5.0000 mg | INTRAMUSCULAR | Status: DC | PRN
Start: 2021-05-23 — End: 2021-05-23

## 2021-05-23 MED ORDER — HYDROMORPHONE HCL 1 MG/ML IJ SOLN: 1.0000 mg | Freq: Once | INTRAMUSCULAR | Status: DC | PRN

## 2021-05-23 MED ORDER — HEPARIN SODIUM (PORCINE) 1000 UNIT/ML IJ SOLN
INTRAMUSCULAR | Status: AC
  Filled 2021-05-23: qty 1

## 2021-05-23 MED ORDER — CEFAZOLIN SODIUM-DEXTROSE 2-4 GM/100ML-% IV SOLN: 2.0000 g | Freq: Once | INTRAVENOUS | Status: AC

## 2021-05-23 MED ORDER — MIDAZOLAM HCL 5 MG/5ML IJ SOLN
INTRAMUSCULAR | Status: AC
  Filled 2021-05-23: qty 5

## 2021-05-23 MED ORDER — DIPHENHYDRAMINE HCL 50 MG/ML IJ SOLN: 50.0000 mg | Freq: Once | INTRAMUSCULAR | Status: DC | PRN

## 2021-05-23 MED ORDER — MIDAZOLAM HCL 2 MG/2ML IJ SOLN
INTRAMUSCULAR | Status: AC
  Filled 2021-05-23: qty 2

## 2021-05-23 MED ORDER — MIDAZOLAM HCL 2 MG/ML PO SYRP: 8.0000 mg | ORAL_SOLUTION | Freq: Once | ORAL | Status: DC | PRN

## 2021-05-23 MED ORDER — SODIUM CHLORIDE 0.9% FLUSH: 3.0000 mL | Freq: Two times a day (BID) | INTRAVENOUS | Status: DC

## 2021-05-23 MED ORDER — ACETAMINOPHEN 325 MG PO TABS: 650.0000 mg | ORAL_TABLET | ORAL | Status: DC | PRN

## 2021-05-23 SURGICAL SUPPLY — 30 items
BALLN DORADO 6X60X80 (BALLOONS) ×2
BALLN LUTONIX 018 4X220X130 (BALLOONS) ×2
BALLN LUTONIX 018 5X300X130 (BALLOONS) ×2
BALLN ULTRVRSE 2X300X150 (BALLOONS) ×2
BALLN ULTRVRSE 2X300X150 OTW (BALLOONS) ×1
BALLN ULTRVRSE 3X300X150 (BALLOONS) ×2
BALLN ULTRVRSE 3X300X150 OTW (BALLOONS) ×1
BALLOON DORADO 6X60X80 (BALLOONS) IMPLANT
BALLOON LUTONIX 018 4X220X130 (BALLOONS) IMPLANT
BALLOON LUTONIX 018 5X300X130 (BALLOONS) IMPLANT
BALLOON ULTRVRSE 2X300X150 OTW (BALLOONS) IMPLANT
BALLOON ULTRVRSE 3X300X150 OTW (BALLOONS) IMPLANT
CANNULA 5F STIFF (CANNULA) ×1 IMPLANT
CATH ANGIO 5F PIGTAIL 65CM (CATHETERS) ×2 IMPLANT
CATH BEACON 5 .038 100 VERT TP (CATHETERS) ×1 IMPLANT
CATH NAVICROSS ANGLED 135CM (MICROCATHETER) ×1 IMPLANT
CATH ROTAREX 135 6FR (CATHETERS) ×1 IMPLANT
COVER PROBE U/S 5X48 (MISCELLANEOUS) ×1 IMPLANT
DEVICE STARCLOSE SE CLOSURE (Vascular Products) ×1 IMPLANT
GLIDEWIRE ADV .035X260CM (WIRE) ×1 IMPLANT
KIT ENCORE 26 ADVANTAGE (KITS) ×1 IMPLANT
PACK ANGIOGRAPHY (CUSTOM PROCEDURE TRAY) ×2 IMPLANT
SHEATH ANL2 6FRX45 HC (SHEATH) ×1 IMPLANT
SHEATH BRITE TIP 5FRX11 (SHEATH) ×2 IMPLANT
STENT VIABAHN 6X50X120 (Permanent Stent) ×1 IMPLANT
SYR MEDRAD MARK 7 150ML (SYRINGE) ×1 IMPLANT
TUBING CONTRAST HIGH PRESS 72 (TUBING) ×1 IMPLANT
WIRE AMPLATZ SSTIFF .035X260CM (WIRE) ×1 IMPLANT
WIRE G V18X300CM (WIRE) ×1 IMPLANT
WIRE GUIDERIGHT .035X150 (WIRE) ×1 IMPLANT

## 2021-05-23 NOTE — Interval H&P Note (Signed)
History and Physical Interval Note:  05/23/2021 8:52 AM  Timothy Juarez  has presented today for surgery, with the diagnosis of RLE Angio   BARD   ASO w rest pain.  The various methods of treatment have been discussed with the patient and family. After consideration of risks, benefits and other options for treatment, the patient has consented to  Procedure(s): LOWER EXTREMITY ANGIOGRAPHY (Right) as a surgical intervention.  The patient's history has been reviewed, patient examined, no change in status, stable for surgery.  I have reviewed the patient's chart and labs.  Questions were answered to the patient's satisfaction.     [name]

## 2021-05-23 NOTE — Discharge Instructions (Signed)
RESTART ELIQUIS TONIGHT, THURSDAY 05/23/21.

## 2021-05-23 NOTE — Op Note (Signed)
[facility]  Percutaneous Study/Intervention Procedural Note   Date of Surgery: 05/23/2021  Surgeon(s):Eilan Mcinerny    Assistants:none  Pre-operative Diagnosis: PAD with rest pain right lower extremity  Post-operative diagnosis:  Same  Procedure(s) Performed:             1.  Ultrasound guidance for vascular access left femoral artery             2.  Catheter placement into right common femoral artery from left femoral approach             3.  Aortogram and selective right lower extremity angiogram             4.  Percutaneous transluminal angioplasty of the right anterior tibial artery with 2 and 3 mm diameter angioplasty balloons             5.  Mechanical thrombectomy with the [place] Rex device to the right SFA and popliteal arteries  6.  Percutaneous transluminal angioplasty of the right distal SFA and popliteal artery with 4 mm diameter Lutonix drug-coated angioplasty balloon and of the entire right SFA up to the common femoral artery above this with 5 mm diameter Lutonix drug-coated angioplasty balloon  7.  Percutaneous transluminal angioplasty of the proximal SFA with 6 mm diameter high-pressure angioplasty balloon  8.  Viabahn stent placement to the right proximal SFA with 6 mm diameter by 5 cm length stent for residual stenosis after angioplasty             9.  StarClose closure device left femoral artery  EBL: 10 cc  Contrast: 65 cc  Fluoro Time: 12.6 minutes  Moderate Conscious Sedation Time: approximately 61 minutes using 7 mg of Versed and 150 mcg of Fentanyl              Indications:  Patient is a 70 y.o.male with recurrent rest pain of the right lower extremity despite multiple previous interventions. The patient has noninvasive study showing markedly reduced right leg perfusion. The patient is brought in for angiography for further evaluation and potential treatment.  Due to the limb threatening nature of the situation, angiogram was performed for  attempted limb salvage. The patient is aware that if the procedure fails, amputation would be expected.  The patient also understands that even with successful revascularization, amputation may still be required due to the severity of the situation. Risks and benefits are discussed and informed consent is obtained.   Procedure:  The patient was identified and appropriate procedural time out was performed.  The patient was then placed supine on the table and prepped and draped in the usual sterile fashion. Moderate conscious sedation was administered during a face to face encounter with the patient throughout the procedure with my supervision of the RN administering medicines and monitoring the patient's vital signs, pulse oximetry, telemetry and mental status throughout from the start of the procedure until the patient was taken to the recovery room. Ultrasound was used to evaluate the left common femoral artery.  It was heavily diseased.  A digital ultrasound image was acquired.  A Seldinger needle was used to access the left common femoral artery under direct ultrasound guidance and a permanent image was performed.  A 0.035 J wire was advanced without resistance and a 5Fr sheath was placed.  Pigtail catheter was placed into the aorta and an AP aortogram was performed. This demonstrated normal renal arteries and normal aorta.  The right iliac stents were widely  patent.  The left common iliac artery is patent, but the left external iliac artery stent was occluded. I then crossed the aortic bifurcation and advanced to the right femoral head. Selective right lower extremity angiogram was then performed. This demonstrated 30 to 40% stenosis in the right common femoral artery with a patent right profunda femoris artery.  The right SFA and popliteal stents were all occluded.  The only reconstitution seen was a diseased peroneal artery in the midsegment. It was felt that it was in the patient's best interest to proceed  with intervention after these images to avoid a second procedure and a larger amount of contrast and fluoroscopy based off of the findings from the initial angiogram. The patient was systemically heparinized and a 6 [place] Ansell sheath was then placed over the [employer] wire. I then used a Kumpe catheter and the advantage wire to navigate into the SFA stents and cross the occlusions without difficulty.  The wire and catheter went into the anterior tibial artery and I was able to get access all the way into the anterior tibial artery in the foot which was intraluminal although heavily diseased.  This was with a Ferd Hibbs cross catheter V 18 wire.  I then proceeded with mechanical thrombectomy with several passes with the [place] Rex device from the right common femoral artery down to the entire right SFA and popliteal arteries down to the origin of the anterior tibial artery.  This restored some channel of flow although flow remained very sluggish.  2 mm diameter by 30 cm length angioplasty balloon was then inflated from the right dorsalis pedis artery in the mid foot up to the origin of the anterior tibial artery.  4 mm diameter by 30 cm length Lutonix drug-coated angioplasty balloon was then inflated from the origin of the anterior tibial artery up through the popliteal artery.  2 inflations with a 5 mm diameter by 30 cm length Lutonix drug-coated angioplasty were done in the common femoral artery, entire SFA, and down to the popliteal artery.  This showed improvement except for the proximal SFA where the entry Occlusion was seen.  I tried ballooning this with a 6 mm diameter high-pressure angioplasty balloon but greater than 70% residual stenosis remained.  I put a 6 mm diameter by 5 cm length Viabahn stent in the proximal SFA and postdilated this with a 6 mm diameter high-pressure angioplasty balloon with less than 10% residual stenosis.  Slow still very sluggish distally and there remained greater than 70%  residual stenosis in the proximal anterior tibial artery that then fed the peroneal artery through collaterals which was the runoff distally.  I treated this area with a 3 mm diameter by 30 cm length angioplasty balloon inflated to 10 atm for 1 minute with less than 30% residual stenosis I elected to terminate the procedure. The sheath was removed and StarClose closure device was deployed in the left femoral artery with excellent hemostatic result. The patient was taken to the recovery room in stable condition having tolerated the procedure well.  Findings:               Aortogram:  This demonstrated normal renal arteries and normal aorta.  The right iliac stents were widely patent.  The left common iliac artery is patent, but the left external iliac artery stent was occluded.              Right lower Extremity: 30 to 40% stenosis in the right common femoral artery  with a patent right profunda femoris artery.  The right SFA and popliteal stents were all occluded.  The only reconstitution seen was a diseased peroneal artery in the midsegment.   Disposition: Patient was taken to the recovery room in stable condition having tolerated the procedure well.  Complications: None  [name] 05/23/2021 10:21 AM   This note was created with Dragon Medical transcription system. Any errors in dictation are purely unintentional.

## 2021-06-06 ENCOUNTER — Encounter: Payer: Self-pay | Admitting: Oncology

## 2021-06-14 ENCOUNTER — Other Ambulatory Visit: Payer: Self-pay

## 2021-06-14 ENCOUNTER — Inpatient Hospital Stay: Payer: Medicare Other | Attending: Oncology

## 2021-06-14 DIAGNOSIS — D509 Iron deficiency anemia, unspecified: Secondary | ICD-10-CM | POA: Insufficient documentation

## 2021-06-14 DIAGNOSIS — Z7901 Long term (current) use of anticoagulants: Secondary | ICD-10-CM | POA: Insufficient documentation

## 2021-06-14 DIAGNOSIS — I739 Peripheral vascular disease, unspecified: Secondary | ICD-10-CM | POA: Diagnosis not present

## 2021-06-14 DIAGNOSIS — D500 Iron deficiency anemia secondary to blood loss (chronic): Secondary | ICD-10-CM

## 2021-06-14 DIAGNOSIS — Z86718 Personal history of other venous thrombosis and embolism: Secondary | ICD-10-CM | POA: Diagnosis not present

## 2021-06-14 LAB — COMPREHENSIVE METABOLIC PANEL
ALT: 11 U/L (ref 0–44)
AST: 15 U/L (ref 15–41)
Albumin: 3.9 g/dL (ref 3.5–5.0)
Alkaline Phosphatase: 116 U/L (ref 38–126)
Anion gap: 7 (ref 5–15)
BUN: 14 mg/dL (ref 8–23)
CO2: 28 mmol/L (ref 22–32)
Calcium: 8.7 mg/dL — ABNORMAL LOW (ref 8.9–10.3)
Chloride: 102 mmol/L (ref 98–111)
Creatinine, Ser: 1.2 mg/dL (ref 0.61–1.24)
GFR, Estimated: >60 mL/min (ref >60)
Glucose, Bld: 116 mg/dL — ABNORMAL HIGH (ref 70–99)
Potassium: 4.1 mmol/L (ref 3.5–5.1)
Sodium: 137 mmol/L (ref 135–145)
Total Bilirubin: 0.4 mg/dL (ref 0.3–1.2)
Total Protein: 7.9 g/dL (ref 6.5–8.1)

## 2021-06-14 LAB — CBC WITH DIFFERENTIAL/PLATELET
Abs Immature Granulocytes: 0.07 10*3/uL (ref 0.00–0.07)
Basophils Absolute: 0.1 10*3/uL (ref 0.0–0.1)
Basophils Relative: 1 %
Eosinophils Absolute: 0.2 10*3/uL (ref 0.0–0.5)
Eosinophils Relative: 2 %
HCT: 43.9 % (ref 39.0–52.0)
Hemoglobin: 13.9 g/dL (ref 13.0–17.0)
Immature Granulocytes: 1 %
Lymphocytes Relative: 16 %
Lymphs Abs: 1.9 10*3/uL (ref 0.7–4.0)
MCH: 25.9 pg — ABNORMAL LOW (ref 26.0–34.0)
MCHC: 31.7 g/dL (ref 30.0–36.0)
MCV: 81.8 fL (ref 80.0–100.0)
Monocytes Absolute: 0.7 10*3/uL (ref 0.1–1.0)
Monocytes Relative: 6 %
Neutro Abs: 8.6 10*3/uL — ABNORMAL HIGH (ref 1.7–7.7)
Neutrophils Relative %: 74 %
Platelets: 348 10*3/uL (ref 150–400)
RBC: 5.37 MIL/uL (ref 4.22–5.81)
RDW: 15.6 % — ABNORMAL HIGH (ref 11.5–15.5)
WBC: 11.5 10*3/uL — ABNORMAL HIGH (ref 4.0–10.5)
nRBC: 0 % (ref 0.0–0.2)

## 2021-06-14 LAB — IRON AND TIBC
Iron: 37 ug/dL — ABNORMAL LOW (ref 45–182)
Saturation Ratios: 12 % — ABNORMAL LOW (ref 17.9–39.5)
TIBC: 312 ug/dL (ref 250–450)
UIBC: 275 ug/dL

## 2021-06-14 LAB — FERRITIN: Ferritin: 93 ng/mL (ref 24–336)

## 2021-06-17 ENCOUNTER — Encounter: Payer: Self-pay | Admitting: Oncology

## 2021-06-17 ENCOUNTER — Inpatient Hospital Stay (HOSPITAL_BASED_OUTPATIENT_CLINIC_OR_DEPARTMENT_OTHER): Payer: Medicare Other | Admitting: Oncology

## 2021-06-17 ENCOUNTER — Other Ambulatory Visit (INDEPENDENT_AMBULATORY_CARE_PROVIDER_SITE_OTHER): Payer: Self-pay | Admitting: Vascular Surgery

## 2021-06-17 ENCOUNTER — Other Ambulatory Visit: Payer: Self-pay

## 2021-06-17 VITALS — BP 130/56 | HR 50 | Temp 97.5°F | Wt 224.0 lb

## 2021-06-17 DIAGNOSIS — Z95828 Presence of other vascular implants and grafts: Secondary | ICD-10-CM | POA: Diagnosis not present

## 2021-06-17 DIAGNOSIS — I70221 Atherosclerosis of native arteries of extremities with rest pain, right leg: Secondary | ICD-10-CM

## 2021-06-17 DIAGNOSIS — D500 Iron deficiency anemia secondary to blood loss (chronic): Secondary | ICD-10-CM | POA: Diagnosis not present

## 2021-06-17 DIAGNOSIS — I82409 Acute embolism and thrombosis of unspecified deep veins of unspecified lower extremity: Secondary | ICD-10-CM | POA: Diagnosis not present

## 2021-06-17 DIAGNOSIS — D509 Iron deficiency anemia, unspecified: Secondary | ICD-10-CM | POA: Diagnosis not present

## 2021-06-17 DIAGNOSIS — Z95820 Peripheral vascular angioplasty status with implants and grafts: Secondary | ICD-10-CM

## 2021-06-17 NOTE — Progress Notes (Signed)
Pt here for follow up. No new concerns voiced.   

## 2021-06-17 NOTE — Progress Notes (Signed)
[facility] Follow up Visit:  Patient Care Team: Brayton Mars, MD as PCP - Timothy Kaufmann, MD as Medical Oncologist (Oncology)  REASON FOR VISIT Follow up for treatment of Recurrent DVT, anticoagulation, iron deficiency anemia  PERTINENT HEMATOLOGY HISTORY [name] 70 y.o. male with PMH listed as below is here for evaluation of recurrent DVT and management. Patient is accompanied by RN from [facility].  Patient is a poor historian. He remembers having leg clots for more than one time. He remembers taking Eliquis in the past and recently his anticoagulation has been switched to [place].  2 Medical records from nursing home by patient's PCP Dr.Slade-Hartman, patient had been on Eliquis 5mg  BID for previous DVTs. His medical records showed he had acute DVT at the left common femoral vein on 11/26/2016, and he was placed on Eliquis 2.5mg  BID. Later when another venous doppler was obtained on 02/15/2017 due to persistent pain and swelling, doppler showed persist deep vein thrombosis involving proxima to distal femoral vein. Common femoral vein has normal compression. There was a note on the second doppler report on 03/03/2017 that patient was taking Eliquis 5mg  BID, which was stopped on 7/19 and patient was placed on Lovenox. Patient had swelling and pain of his left stump and symptoms does not improve with being on Lovenox. Xarelto 15mg  BID was started on 03/05/2017 and Lovenox was discontinued on 03/09/2017 with a plan to switch to Xarelto 20mg  daily after 21 days.  It was mentioned in nursing home note that patient had been on Warfarin previously and acquired blood clot while on warfarin. Patient does not remember this and the details of his?warfarin resistance is unclear.  3 His Lupus anticoagulant testing was positive due to prolonged DRVVT, although possibly falsely positive due to [place]. He was offered to be switched to Lovenox shots  as alternative for treatment of possible lupus anticoagulant hypercoagulable state, patient prefers to stay on Xalreto 20mg  daily and has been doing well on that.   Repeat anti phospholipid panel negative.    admitted in Feb 2019 due to acute on chronic blood loss anemia, hemoglobin 5.3 on presentation.He wasansfused with 2 units of PRBC and IV iron infusion..  Xarelto was discontinued due to GI bleeding.   IVC filter was placed on 2/25/ 2019 given patient's high risk of DVT recurrence.  He has had extensive GI work up including upper and lower endoscopy and capsule study, did not reveal active bleeding sites.  Plan to repeat EGD for gastric mapping and colonoscopy in 6-8 months due to poor prep in 09/2017  # 04/30/2020- 9/13/201 pt was hospitalized due to hematemesis with bright red blood, pain with swallowing,  GI work up showed esophageal ulcers w/ stigmata of recent bleeding. Pt did receive 3 days of IV protonix and will be d/c home w/ po pantoprazole 40mg  BID. Patient has been on Eliquis 5mg  BID prior to this hospitalization and eliquis was held.   # 06/18/2020, upper endoscopy by Dr. Leron Croak duodenal bulb and second portion of the duodenum.  Erosive gastropathy with no bleeding.  Erythematous mucosa in the gastrics.  Biopsied.  Small hiatal hernia.  LA grade a reflux esophagitis with no bleeding. Biopsy showed chronic gastritis   INTERVAL HISTORY Timothy Juarez is a 70 y.o. male who has above history reviewed by me today presents for follow up visit for iron deficiency anemia, and history of recurrent DVT Patient was here by himself. He  reports feeling well.   05/23/2021 he recently had  right lower extremity angioplasty by Dr. Lucky Cowboy Today he denies any pain.  Left leg stump no swelling or pain.    Review of Systems  Constitutional:  Negative for appetite change, chills, fatigue and fever.  HENT:   Negative for voice change.   Eyes:  Negative for eye problems and icterus.   Respiratory:  Negative for chest tightness, cough and shortness of breath.   Cardiovascular:  Negative for chest pain and leg swelling.  Gastrointestinal:  Negative for abdominal distention, abdominal pain, blood in stool, nausea and vomiting.  Endocrine: Negative for hot flashes.  Genitourinary:  Negative for difficulty urinating, dysuria and frequency.   Musculoskeletal:  Negative for arthralgias.       Left lower extremity stump no swelling  Skin:  Negative for itching and rash.  Neurological:  Negative for light-headedness and numbness.  Hematological:  Negative for adenopathy. Does not bruise/bleed easily.  Psychiatric/Behavioral:  Negative for confusion.    MEDICAL HISTORY: Past Medical History:  Diagnosis Date   Acute embolism and thombos unsp deep vn unsp lower extremity (HCC)    Acute respiratory failure (HCC)    AKI (acute kidney injury) ([place]) 12/27/2018   Allergy    Anemia    Aneurysm of unspecified site ([place])    ARF (acute respiratory failure) (HCC)    H/O   Bronchitis    Cellulitis and abscess of leg 09/27/2019   CHF (congestive heart failure) (HCC)    COPD (chronic obstructive pulmonary disease) (HCC)    Cough    Epistaxis    Gastrointestinal hemorrhage    GERD (gastroesophageal reflux disease)    Gout    Hematemesis 04/27/2020   High anion gap metabolic acidosis 0/53/9767   Hyperlipidemia    Hypertension    Hypokalemia    Infection of above knee amputation stump ([place]) 09/27/2019   Insomnia    Iron deficiency anemia 04/21/2017   Muscle contracture    MUSCLE SPASMS, muscle weakness   Peripheral vascular disease (HCC)    Pneumonia    Pressure ulcer    Severe sepsis ([place]) 12/27/2018   Toxic encephalopathy 12/27/2018    SURGICAL HISTORY: Past Surgical History:  Procedure Laterality Date   AMPUTATION Left 09/19/2015   Procedure: AMPUTATION BELOW KNEE;  Surgeon: Algernon Huxley, MD;  Location: [facility];  Service: Vascular;  Laterality: Left;   AMPUTATION Left 11/01/2015    Procedure: AMPUTATION ABOVE KNEE;  Surgeon: Algernon Huxley, MD;  Location: [facility];  Service: Vascular;  Laterality: Left;   AMPUTATION Left 09/29/2019   Procedure: IRRIGATION AND DEBRIDEMENT OF LEFT AKA;  Surgeon: Algernon Huxley, MD;  Location: [facility];  Service: General;  Laterality: Left;   APPLICATION OF WOUND VAC Left 10/18/2015   Procedure: APPLICATION OF WOUND VAC;  Surgeon: Algernon Huxley, MD;  Location: [facility];  Service: Vascular;  Laterality: Left;   APPLICATION OF WOUND VAC Left 09/30/2019   Procedure: APPLICATION OF WOUND VAC;  Surgeon: Katha Cabal, MD;  Location: [facility];  Service: Vascular;  Laterality: Left;  serial # [id number]   IOXBDZH CATHETERIZATION     CATARACT EXTRACTION W/PHACO Right 08/18/2019   Procedure: CATARACT EXTRACTION PHACO AND INTRAOCULAR LENS PLACEMENT ([place]) RIGHT;  Surgeon: Birder Robson, MD;  Location: [facility];  Service: Ophthalmology;  Laterality: Right;  [place] 03:29.0 CDE 45.68 Fluid Pack Lot # [id number]   CENTRAL VENOUS CATHETER INSERTION Right 09/30/2019   Procedure: INSERTION CENTRAL LINE  ADULT;  Surgeon: Katha Cabal, MD;  Location: [facility];  Service: Vascular;  Laterality: Right;   COLONOSCOPY WITH PROPOFOL N/A 05/25/2017   Procedure: COLONOSCOPY WITH PROPOFOL;  Surgeon: Jonathon Bellows, MD;  Location[facility] ENDOSCOPY;  Service: Gastroenterology;  Laterality: N/A;   COLONOSCOPY WITH PROPOFOL N/A 10/14/2017   Procedure: COLONOSCOPY WITH PROPOFOL;  Surgeon: Jonathon Bellows, MD;  Location[facility]COPY;  Service: Gastroenterology;  Laterality: N/A;   ESOPHAGOGASTRODUODENOSCOPY (EGD) WITH PROPOFOL N/A 05/25/2017   Procedure: ESOPHAGOGASTRODUODENOSCOPY (EGD) WITH PROPOFOL;  Surgeon: Jonathon Bellows, MD;  Location: [facility] ENDOSCOPY;  Service: Gastroenterology;  Laterality: N/A;   ESOPHAGOGASTRODUODENOSCOPY (EGD) WITH PROPOFOL N/A 10/14/2017   Procedure: ESOPHAGOGASTRODUODENOSCOPY (EGD) WITH PROPOFOL;  Surgeon: Jonathon Bellows, MD;  Location: [facility] ENDOSCOPY;  Service:  Gastroenterology;  Laterality: N/A;   ESOPHAGOGASTRODUODENOSCOPY (EGD) WITH PROPOFOL N/A 04/27/2020   Procedure: ESOPHAGOGASTRODUODENOSCOPY (EGD) WITH PROPOFOL;  Surgeon: Lin Landsman, MD;  Location: [facility] ENDOSCOPY;  Service: Gastroenterology;  Laterality: N/A;   ESOPHAGOGASTRODUODENOSCOPY (EGD) WITH PROPOFOL N/A 06/18/2020   Procedure: ESOPHAGOGASTRODUODENOSCOPY (EGD) WITH PROPOFOL;  Surgeon: Lin Landsman, MD;  Location: [facility] ENDOSCOPY;  Service: Gastroenterology;  Laterality: N/A;   EYE SURGERY     GIVENS CAPSULE STUDY N/A 07/14/2017   Procedure: GIVENS CAPSULE STUDY;  Surgeon: Jonathon Bellows, MD;  Location: [facility] ENDOSCOPY;  Service: Gastroenterology;  Laterality: N/A;   GIVENS CAPSULE STUDY N/A 10/30/2017   Procedure: GIVENS CAPSULE STUDY 12 HR;  Surgeon: Jonathon Bellows, MD;  Location: [facility] ENDOSCOPY;  Service: Gastroenterology;  Laterality: N/A;   IVC FILTER INSERTION N/A 10/12/2017   Procedure: IVC FILTER INSERTION;  Surgeon: Algernon Huxley, MD;  Location: [place] CV LAB;  Service: Cardiovascular;  Laterality: N/A;   LOWER EXTREMITY ANGIOGRAPHY Right 10/04/2018   Procedure: LOWER EXTREMITY ANGIOGRAPHY;  Surgeon: Algernon Huxley, MD;  Location: [place] CV LAB;  Service: Cardiovascular;  Laterality: Right;   LOWER EXTREMITY ANGIOGRAPHY Right 03/21/2021   Procedure: LOWER EXTREMITY ANGIOGRAPHY;  Surgeon: Algernon Huxley, MD;  Location: [place] CV LAB;  Service: Cardiovascular;  Laterality: Right;   LOWER EXTREMITY ANGIOGRAPHY Right 05/23/2021   Procedure: LOWER EXTREMITY ANGIOGRAPHY;  Surgeon: Algernon Huxley, MD;  Location: [place] CV LAB;  Service: Cardiovascular;  Laterality: Right;   PERIPHERAL VASCULAR CATHETERIZATION Left 01/18/2015   Procedure: Lower Extremity Angiography;  Surgeon: Algernon Huxley, MD;  Location: [place] CV LAB;  Service: Cardiovascular;  Laterality: Left;   PERIPHERAL VASCULAR CATHETERIZATION N/A 01/18/2015   Procedure: Lower Extremity Intervention;   Surgeon: Algernon Huxley, MD;  Location: [place] CV LAB;  Service: Cardiovascular;  Laterality: N/A;   PERIPHERAL VASCULAR CATHETERIZATION  07/30/2015   Procedure: Lower Extremity Intervention;  Surgeon: Algernon Huxley, MD;  Location: [place] CV LAB;  Service: Cardiovascular;;   PERIPHERAL VASCULAR CATHETERIZATION N/A 07/30/2015   Procedure: Abdominal Aortogram w/Lower Extremity;  Surgeon: Algernon Huxley, MD;  Location: [place] CV LAB;  Service: Cardiovascular;  Laterality: N/A;   PERIPHERAL VASCULAR CATHETERIZATION Left 08/22/2015   Procedure: Lower Extremity Angiography;  Surgeon: Algernon Huxley, MD;  Location: [place] CV LAB;  Service: Cardiovascular;  Laterality: Left;   PERIPHERAL VASCULAR CATHETERIZATION Left 08/22/2015   Procedure: Lower Extremity Intervention;  Surgeon: Algernon Huxley, MD;  Location: [facility] CV LAB;  Service: Cardiovascular;  Laterality: Left;   PERIPHERAL VASCULAR CATHETERIZATION Right 03/31/2016   Procedure: Lower Extremity Angiography;  Surgeon: Algernon Huxley, MD;  Location: [place] CV LAB;  Service: Cardiovascular;  Laterality: Right;  PERIPHERAL VASCULAR CATHETERIZATION  03/31/2016   Procedure: Lower Extremity Intervention;  Surgeon: Algernon Huxley, MD;  Location: [place] CV LAB;  Service: Cardiovascular;;   PERIPHERAL VASCULAR CATHETERIZATION Left 04/10/2016   Procedure: Lower Extremity Angiography;  Surgeon: Algernon Huxley, MD;  Location: [place] CV LAB;  Service: Cardiovascular;  Laterality: Left;   VACUUM ASSISTED CLOSURE CHANGE Left 10/03/2019   Procedure: LEFT THIGH VACUUM ASSISTED CLOSURE CHANGE;  Surgeon: Algernon Huxley, MD;  Location: [facility];  Service: General;  Laterality: Left;   WOUND DEBRIDEMENT Left 10/18/2015   Procedure: DEBRIDEMENT WOUND   ( LEFT BKA DEBRIDEMENT );  Surgeon: Algernon Huxley, MD;  Location: [facility];  Service: Vascular;  Laterality: Left;    SOCIAL HISTORY: Social History   Socioeconomic History   Marital status:  Legally Separated    Spouse name: Not on file   Number of children: Not on file   Years of education: Not on file   Highest education level: Not on file  Occupational History   Not on file  Tobacco Use   Smoking status: Former    Packs/day: 1.00    Years: 44.00    Pack years: 44.00    Types: Cigarettes    Quit date: 11/17/2012    Years since quitting: 8.5   Smokeless tobacco: Never  Vaping Use   Vaping Use: Never used  Substance and Sexual Activity   Alcohol use: Not Currently   Drug use: Never   Sexual activity: Never  Other Topics Concern   Not on file  Social History Narrative   Not on file   Social Determinants of Health   Financial Resource Strain: Not on file  Food Insecurity: Not on file  Transportation Needs: Not on file  Physical Activity: Not on file  Stress: Not on file  Social Connections: Not on file  Intimate Partner Violence: Not on file    FAMILY HISTORY: Mother died from an MI. Father died from a brain tumor  ALLERGIES:  has No Known Allergies.  MEDICATIONS:  Current Outpatient Medications  Medication Sig Dispense Refill   acetaminophen (TYLENOL) 325 MG tablet Take 650 mg by mouth every 4 (four) hours as needed for fever.      acidophilus (RISAQUAD) CAPS capsule Take 1 capsule by mouth 2 (two) times daily.     ammonium lactate (AMLACTIN) 12 % cream Apply topically 2 (two) times daily. Apply to right foot     apixaban (ELIQUIS) 2.5 MG TABS tablet Take 1 tablet (2.5 mg total) by mouth 2 (two) times daily. 60 tablet 3   ascorbic acid (VITAMIN C) 500 MG tablet Take 500 mg by mouth 2 (two) times daily.     aspirin EC 81 MG EC tablet Take 1 tablet (81 mg total) by mouth daily. Swallow whole. 90 tablet 3   atorvastatin (LIPITOR) 10 MG tablet Take 1 tablet (10 mg total) by mouth daily. (Patient taking differently: Take 10 mg by mouth at bedtime.) 30 tablet 11   baclofen (LIORESAL) 10 MG tablet Take 10 mg by mouth 3 (three) times daily. Hold for sedation      brimonidine-timolol (COMBIGAN) 0.2-0.5 % ophthalmic solution Place 1 drop into the right eye 2 (two) times daily.     ciprofloxacin (CIPRO) 500 MG tablet Take 500 mg by mouth 2 (two) times daily. Twice a day for 10 days     docusate sodium (COLACE) 100 MG capsule Take 100 mg by mouth daily as needed for mild  constipation.     doxycycline (MONODOX) 100 MG capsule Take 100 mg by mouth 2 (two) times daily. 1 capsule Twice a day for 10 days     fentaNYL (DURAGESIC) 25 MCG/HR Place 1 patch onto the skin every 3 (three) days.      ferrous sulfate 324 (65 Fe) MG TBEC Take 324 mg by mouth 2 (two) times daily.      gabapentin (NEURONTIN) 100 MG capsule Take 100 mg by mouth at bedtime.      ketorolac (ACULAR) 0.5 % ophthalmic solution Place 1 drop into the left eye 2 (two) times daily.     Melatonin 5 MG TABS Take 5 mg by mouth at bedtime as needed (insomnia).      Omega-3 Fatty Acids (FISH OIL) 1000 MG CAPS Take 1,000 mg by mouth daily.      Oxycodone HCl 10 MG TABS Take 10 mg by mouth every 4 (four) hours as needed (pain).      pantoprazole (PROTONIX) 40 MG tablet Take 1 tablet (40 mg total) by mouth 2 (two) times daily. 60 tablet 0   potassium chloride SA (K-DUR,KLOR-CON) 20 MEQ tablet Take 20 mEq by mouth daily. With or after meal     sodium chloride (OCEAN) 0.65 % SOLN nasal spray Place 2 sprays into both nostrils 2 (two) times daily as needed for congestion.     tamsulosin (FLOMAX) 0.4 MG CAPS capsule Take 1 capsule (0.4 mg total) by mouth daily. 30 capsule 0   TRELEGY ELLIPTA 100-62.5-25 MCG/INH AEPB Inhale 1 puff into the lungs every morning.      guaiFENesin (ROBITUSSIN) 100 MG/5ML SOLN Take 15 mLs by mouth 3 (three) times daily as needed for cough (congestion). (Patient not taking: Reported on 06/17/2021)     loperamide (IMODIUM) 2 MG capsule Take 2 mg by mouth 3 (three) times daily as needed for diarrhea or loose stools. (Patient not taking: Reported on 06/17/2021)     Morphine Sulfate (MORPHINE  CONCENTRATE) 10 mg / 0.5 ml concentrated solution  (Patient not taking: Reported on 06/17/2021)     nitroGLYCERIN (NITROSTAT) 0.4 MG SL tablet Place 0.14 mg under the tongue every 5 (five) minutes as needed for chest pain. (Patient not taking: Reported on 06/17/2021)     oxymetazoline (AFRIN) 0.05 % nasal spray Place 2 sprays into both nostrils 2 (two) times daily as needed (For nose bleeds).  (Patient not taking: Reported on 06/17/2021)     No current facility-administered medications for this visit.    PHYSICAL EXAMINATION:  ECOG PERFORMANCE STATUS: 2 - Symptomatic, <50% confined to bed  Vitals:   06/17/21 1046  BP: (!) 130/56  Pulse: (!) 50  Temp: (!) 97.5 F (36.4 C)    Filed Weights   06/17/21 1046  Weight: 224 lb (101.6 kg)   Physical Exam Constitutional:      General: He is not in acute distress.    Appearance: He is not diaphoretic.  HENT:     Head: Normocephalic and atraumatic.     Nose: Nose normal.     Mouth/Throat:     Pharynx: No oropharyngeal exudate.  Eyes:     General: No scleral icterus.       Left eye: No discharge.     Pupils: Pupils are equal, round, and reactive to light.  Neck:     Vascular: No JVD.  Cardiovascular:     Rate and Rhythm: Normal rate and regular rhythm.     Heart sounds: Normal heart  sounds. No murmur heard. Pulmonary:     Effort: Pulmonary effort is normal. No respiratory distress.     Breath sounds: Normal breath sounds. No wheezing or rales.  Chest:     Chest wall: No tenderness.  Abdominal:     General: Bowel sounds are normal. There is no distension.     Palpations: Abdomen is soft. There is no mass.     Tenderness: There is no abdominal tenderness.  Musculoskeletal:        General: No tenderness. Normal range of motion.     Cervical back: Normal range of motion and neck supple.     Comments: left above knee amputation stump non tender or swelling. No edema at right lower extremity  Lymphadenopathy:     Cervical: No  cervical adenopathy.  Skin:    General: Skin is warm and dry.     Findings: No erythema or rash.  Neurological:     Mental Status: He is alert and oriented to person, place, and time.     Cranial Nerves: No cranial nerve deficit.     Motor: No abnormal muscle tone.     Coordination: Coordination normal.      LABORATORY DATA: I have personally reviewed the data as listed:  Clinical Support on 06/14/2021  Component Date Value Ref Range Status   Ferritin 06/14/2021 93  24 - 336 ng/mL Final   Performed at [facility], [address] [place], [place] [address]   Iron 06/14/2021 37 (A)  45 - 182 ug/dL Final   TIBC 06/14/2021 312  250 - 450 ug/dL Final   Saturation Ratios 06/14/2021 12 (A)  17.9 - 39.5 % Final   UIBC 06/14/2021 275  ug/dL Final   Performed at [facility], [address] [place], [place] [address]   Sodium 06/14/2021 137  135 - 145 mmol/L Final   Potassium 06/14/2021 4.1  3.5 - 5.1 mmol/L Final   Chloride 06/14/2021 102  98 - 111 mmol/L Final   CO2 06/14/2021 28  22 - 32 mmol/L Final   Glucose, Bld 06/14/2021 116 (A)  70 - 99 mg/dL Final   Glucose reference range applies only to samples taken after fasting for at least 8 hours.   BUN 06/14/2021 14  8 - 23 mg/dL Final   Creatinine, Ser 06/14/2021 1.20  0.61 - 1.24 mg/dL Final   Calcium 06/14/2021 8.7 (A)  8.9 - 10.3 mg/dL Final   Total Protein 06/14/2021 7.9  6.5 - 8.1 g/dL Final   Albumin 06/14/2021 3.9  3.5 - 5.0 g/dL Final   AST 06/14/2021 15  15 - 41 U/L Final   ALT 06/14/2021 11  0 - 44 U/L Final   Alkaline Phosphatase 06/14/2021 116  38 - 126 U/L Final   Total Bilirubin 06/14/2021 0.4  0.3 - 1.2 mg/dL Final   GFR, Estimated 06/14/2021 >60  >60 mL/min Final   Comment: (NOTE) Calculated using the CKD-EPI Creatinine Equation (2021)    Anion gap 06/14/2021 7  5 - 15 Final   Performed at [facility], [address] [place],  [address]   WBC 06/14/2021 11.5 (A)  4.0 - 10.5  K/uL Final   RBC 06/14/2021 5.37  4.22 - 5.81 MIL/uL Final   Hemoglobin 06/14/2021 13.9  13.0 - 17.0 g/dL Final   HCT 06/14/2021 43.9  39.0 - 52.0 % Final   MCV 06/14/2021 81.8  80.0 - 100.0 fL Final   MCH 06/14/2021 25.9 (A)  26.0 - 34.0 pg Final   MCHC 06/14/2021 31.7  30.0 - 36.0 g/dL Final   RDW 06/14/2021 15.6 (A)  11.5 - 15.5 % Final   Platelets 06/14/2021 348  150 - 400 K/uL Final   nRBC 06/14/2021 0.0  0.0 - 0.2 % Final   Neutrophils Relative % 06/14/2021 74  % Final   Neutro Abs 06/14/2021 8.6 (A)  1.7 - 7.7 K/uL Final   Lymphocytes Relative 06/14/2021 16  % Final   Lymphs Abs 06/14/2021 1.9  0.7 - 4.0 K/uL Final   Monocytes Relative 06/14/2021 6  % Final   Monocytes Absolute 06/14/2021 0.7  0.1 - 1.0 K/uL Final   Eosinophils Relative 06/14/2021 2  % Final   Eosinophils Absolute 06/14/2021 0.2  0.0 - 0.5 K/uL Final   Basophils Relative 06/14/2021 1  % Final   Basophils Absolute 06/14/2021 0.1  0.0 - 0.1 K/uL Final   Immature Granulocytes 06/14/2021 1  % Final   Abs Immature Granulocytes 06/14/2021 0.07  0.00 - 0.07 K/uL Final   Performed at [facility], [address] [place], [place] [address]  Admission on 05/23/2021, Discharged on 05/23/2021  Component Date Value Ref Range Status   BUN 05/23/2021 19  8 - 23 mg/dL Final   Performed at [facility], [address] [place], [place] [address]   Creatinine, Ser 05/23/2021 1.18  0.61 - 1.24 mg/dL Final   GFR, Estimated 05/23/2021 >60  >60 mL/min Final   Comment: (NOTE) Calculated using the CKD-EPI Creatinine Equation (2021) Performed at [facility], [address] [place], [place] [address]     RADIOGRAPHIC STUDIES: I have personally reviewed the radiological images as listed and agree with the findings in the report 04/03/2017 US venous doppler IMPRESSION: Left inguinal/femoral partially thrombosed pseudoaneurysm/hematoma measuring 15 x 5.3 x 6.8 cm.  Reviewed image records from [facility] home 11/26/2016 patient had a venous Doppler extremity left done which showed no occluding acute DVT at the left common femoral vein. 03/03/2017 patient had another venous Doppler extremity left which showed deep vein thromboses involving the proximal to distal femoral vein the common femoral artery now has normal compression.  04/05/2013 CT chest with contrast    Chronic pulmonary disease with areas of atelectasis and bronchiectasis. Continued significant opacification and bullous change in the left lung especially in the upper lobe and superior segment of the  left lower lobe with other patchy areas bilaterally. There may be mild interval improvement in the area of the lower lingula. Malignancy cannot be excluded in the abnormal areas of the lungs. No central pulmonary arterial filling defect is seen. The bolus is not optimal for evaluation of the lungs  otherwise.    ASSESSMENT/PLAN 1. Recurrent deep vein thrombosis (DVT) ([place])   2. Iron deficiency anemia due to chronic blood loss   3. Presence of IVC filter    #Iron deficiency anemia. Continue ferrous sulfate 325 mg twice daily.  #History of recurrent left lower extremity deep vein thrombosis, Continue Eliquis 2.5 mg twice daily.  #Peripheral vascular disease, IVC filter.  Continue follow-up with vascular surgery.   Follow up in 6 months.   Earlie Server, MD, PhD Hematology Oncology [place] at [facility]

## 2021-06-18 ENCOUNTER — Ambulatory Visit (INDEPENDENT_AMBULATORY_CARE_PROVIDER_SITE_OTHER): Payer: Medicare Other

## 2021-06-18 ENCOUNTER — Ambulatory Visit (INDEPENDENT_AMBULATORY_CARE_PROVIDER_SITE_OTHER): Payer: Medicare Other | Admitting: Nurse Practitioner

## 2021-06-18 VITALS — BP 129/68 | HR 63 | Ht 73.0 in | Wt 224.0 lb

## 2021-06-18 DIAGNOSIS — I70221 Atherosclerosis of native arteries of extremities with rest pain, right leg: Secondary | ICD-10-CM | POA: Diagnosis not present

## 2021-06-18 DIAGNOSIS — E785 Hyperlipidemia, unspecified: Secondary | ICD-10-CM

## 2021-06-18 DIAGNOSIS — I739 Peripheral vascular disease, unspecified: Secondary | ICD-10-CM

## 2021-06-18 DIAGNOSIS — I10 Essential (primary) hypertension: Secondary | ICD-10-CM

## 2021-06-18 DIAGNOSIS — Z95820 Peripheral vascular angioplasty status with implants and grafts: Secondary | ICD-10-CM | POA: Diagnosis not present

## 2021-06-20 ENCOUNTER — Encounter (INDEPENDENT_AMBULATORY_CARE_PROVIDER_SITE_OTHER): Payer: Medicaid Other | Admitting: Nurse Practitioner

## 2021-06-20 ENCOUNTER — Encounter (INDEPENDENT_AMBULATORY_CARE_PROVIDER_SITE_OTHER): Payer: Medicare Other

## 2021-06-27 ENCOUNTER — Telehealth (INDEPENDENT_AMBULATORY_CARE_PROVIDER_SITE_OTHER): Payer: Self-pay

## 2021-06-28 NOTE — Telephone Encounter (Signed)
Unfortunately, Mr. Timothy Juarez wounds are going to be slow to heal due to his decreased perfusion.  We discussed this at his last office visit.  We have done multiple interventions and none of them have been successful.  The patient has extensive tibial level and microvascular disease.  We discussed that further vascular interventions would likely not be effective.  Due to this we discussed him continuing with his current wound care process in detail the pain increased and got to an unbearable level.  When I previously spoke with him he denied any extensive pain.  If he is in unbearable pain we can try another angiogram but there is a likelihood that this will not be successful.  If it is not successful, the only option past that point would likely be amputation.  If the patient would like to try another angiogram we can go forward with that or if he wants to discuss amputation we can schedule him for an office visit.

## 2021-06-28 NOTE — Telephone Encounter (Signed)
Patient was made aware with medical recommendations and would like to move forward with schedule follow up in the office to discuss amputation. Could we contact Linus Orn from [facility] to schedule appointment.

## 2021-06-29 NOTE — Progress Notes (Signed)
Subjective:    Patient ID: Timothy Juarez, male    DOB: 11-Dec-1950, 70 y.o.   MRN: [id number] Chief Complaint  Patient presents with   Follow-up    6 Mo U/S     Timothy Juarez is a 70 year old male that returns today for follow-up intervention on 05/23/2021.  The patient underwent right lower extremity angiogram.  However he denies any drastic change in his symptoms.  He denies new wounds or ulcerations.  He denies ischemic or constant pain.  Today noninvasive studies show an ABI of 0.38 on the right lower extremity.  Severely dampened monophasic waveforms.  No toe waveforms due to previous amputation.   Review of Systems  Cardiovascular:  Positive for leg swelling.  Musculoskeletal:  Positive for gait problem.  Skin:  Positive for wound.  Neurological:  Positive for weakness.  All other systems reviewed and are negative.     Objective:   Physical Exam Vitals reviewed.  HENT:     Head: Normocephalic.  Cardiovascular:     Rate and Rhythm: Normal rate.  Pulmonary:     Effort: Pulmonary effort is normal.  Musculoskeletal:     Left Lower Extremity: Left leg is amputated above knee.  Neurological:     Mental Status: He is alert and oriented to person, place, and time.  Psychiatric:        Mood and Affect: Mood normal.        Behavior: Behavior normal.        Thought Content: Thought content normal.        Judgment: Judgment normal.    BP 129/68   Pulse 63   Ht 6\' 1"  (1.854 m)   Wt 224 lb (101.6 kg)   BMI 29.55 kg/m   Past Medical History:  Diagnosis Date   Acute embolism and thombos unsp deep vn unsp lower extremity (HCC)    Acute respiratory failure (HCC)    AKI (acute kidney injury) ([place]) 12/27/2018   Allergy    Anemia    Aneurysm of unspecified site (HCC)    ARF (acute respiratory failure) (HCC)    H/O   Bronchitis    Cellulitis and abscess of leg 09/27/2019   CHF (congestive heart failure) (HCC)    COPD (chronic obstructive pulmonary disease) (HCC)     Cough    Epistaxis    Gastrointestinal hemorrhage    GERD (gastroesophageal reflux disease)    Gout    Hematemesis 04/27/2020   High anion gap metabolic acidosis 11/06/252   Hyperlipidemia    Hypertension    Hypokalemia    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) 09/27/2019   Insomnia    Iron deficiency anemia 04/21/2017   Muscle contracture    MUSCLE SPASMS, muscle weakness   Peripheral vascular disease (HCC)    Pneumonia    Pressure ulcer    Severe sepsis ([place]) 12/27/2018   Toxic encephalopathy 12/27/2018    Social History   Socioeconomic History   Marital status: Legally Separated    Spouse name: Not on file   Number of children: Not on file   Years of education: Not on file   Highest education level: Not on file  Occupational History   Not on file  Tobacco Use   Smoking status: Former    Packs/day: 1.00    Years: 44.00    Pack years: 44.00    Types: Cigarettes    Quit date: 11/17/2012    Years since quitting: 8.6  Smokeless tobacco: Never  Vaping Use   Vaping Use: Never used  Substance and Sexual Activity   Alcohol use: Not Currently   Drug use: Never   Sexual activity: Never  Other Topics Concern   Not on file  Social History Narrative   Not on file   Social Determinants of Health   Financial Resource Strain: Not on file  Food Insecurity: Not on file  Transportation Needs: Not on file  Physical Activity: Not on file  Stress: Not on file  Social Connections: Not on file  Intimate Partner Violence: Not on file    Past Surgical History:  Procedure Laterality Date   AMPUTATION Left 09/19/2015   Procedure: AMPUTATION BELOW KNEE;  Surgeon: Algernon Huxley, MD;  Location: [facility];  Service: Vascular;  Laterality: Left;   AMPUTATION Left 11/01/2015   Procedure: AMPUTATION ABOVE KNEE;  Surgeon: Algernon Huxley, MD;  Location: [facility];  Service: Vascular;  Laterality: Left;   AMPUTATION Left 09/29/2019   Procedure: IRRIGATION AND DEBRIDEMENT OF LEFT AKA;  Surgeon:  Algernon Huxley, MD;  Location: [facility];  Service: General;  Laterality: Left;   APPLICATION OF WOUND VAC Left 10/18/2015   Procedure: APPLICATION OF WOUND VAC;  Surgeon: Algernon Huxley, MD;  Location: [facility];  Service: Vascular;  Laterality: Left;   APPLICATION OF WOUND VAC Left 09/30/2019   Procedure: APPLICATION OF WOUND VAC;  Surgeon: Katha Cabal, MD;  Location: [facility];  Service: Vascular;  Laterality: Left;  serial # [id number]   FKCLEXN CATHETERIZATION     CATARACT EXTRACTION W/PHACO Right 08/18/2019   Procedure: CATARACT EXTRACTION PHACO AND INTRAOCULAR LENS PLACEMENT ([place]) RIGHT;  Surgeon: Birder Robson, MD;  Location: [facility];  Service: Ophthalmology;  Laterality: Right;  [place] 03:29.0 CDE 45.68 Fluid Pack Lot # [id number]   CENTRAL VENOUS CATHETER INSERTION Right 09/30/2019   Procedure: INSERTION CENTRAL LINE ADULT;  Surgeon: Katha Cabal, MD;  Location: [facility];  Service: Vascular;  Laterality: Right;   COLONOSCOPY WITH PROPOFOL N/A 05/25/2017   Procedure: COLONOSCOPY WITH PROPOFOL;  Surgeon: Jonathon Bellows, MD;  Location: [facility]COPY;  Service: Gastroenterology;  Laterality: N/A;   COLONOSCOPY WITH PROPOFOL N/A 10/14/2017   Procedure: COLONOSCOPY WITH PROPOFOL;  Surgeon: Jonathon Bellows, MD;  Location: [facility] ENDOSCOPY;  Service: Gastroenterology;  Laterality: N/A;   ESOPHAGOGASTRODUODENOSCOPY (EGD) WITH PROPOFOL N/A 05/25/2017   Procedure: ESOPHAGOGASTRODUODENOSCOPY (EGD) WITH PROPOFOL;  Surgeon: Jonathon Bellows, MD;  Location: [facility] ENDOSCOPY;  Service: Gastroenterology;  Laterality: N/A;   ESOPHAGOGASTRODUODENOSCOPY (EGD) WITH PROPOFOL N/A 10/14/2017   Procedure: ESOPHAGOGASTRODUODENOSCOPY (EGD) WITH PROPOFOL;  Surgeon: Jonathon Bellows, MD;  Location: [facility] ENDOSCOPY;  Service: Gastroenterology;  Laterality: N/A;   ESOPHAGOGASTRODUODENOSCOPY (EGD) WITH PROPOFOL N/A 04/27/2020   Procedure: ESOPHAGOGASTRODUODENOSCOPY (EGD) WITH PROPOFOL;  Surgeon: Lin Landsman, MD;  Location: [facility]  ENDOSCOPY;  Service: Gastroenterology;  Laterality: N/A;   ESOPHAGOGASTRODUODENOSCOPY (EGD) WITH PROPOFOL N/A 06/18/2020   Procedure: ESOPHAGOGASTRODUODENOSCOPY (EGD) WITH PROPOFOL;  Surgeon: Lin Landsman, MD;  Location: [facility] ENDOSCOPY;  Service: Gastroenterology;  Laterality: N/A;   EYE SURGERY     GIVENS CAPSULE STUDY N/A 07/14/2017   Procedure: GIVENS CAPSULE STUDY;  Surgeon: Jonathon Bellows, MD;  Location: [facility] ENDOSCOPY;  Service: Gastroenterology;  Laterality: N/A;   GIVENS CAPSULE STUDY N/A 10/30/2017   Procedure: GIVENS CAPSULE STUDY 12 HR;  Surgeon: Jonathon Bellows, MD;  Location: [facility] ENDOSCOPY;  Service: Gastroenterology;  Laterality: N/A;   IVC FILTER INSERTION N/A 10/12/2017   Procedure: IVC FILTER INSERTION;  Surgeon: Algernon Huxley, MD;  Location: [place] CV LAB;  Service: Cardiovascular;  Laterality: N/A;   LOWER EXTREMITY ANGIOGRAPHY Right 10/04/2018   Procedure: LOWER EXTREMITY ANGIOGRAPHY;  Surgeon: Algernon Huxley, MD;  Location: [place] CV LAB;  Service: Cardiovascular;  Laterality: Right;   LOWER EXTREMITY ANGIOGRAPHY Right 03/21/2021   Procedure: LOWER EXTREMITY ANGIOGRAPHY;  Surgeon: Algernon Huxley, MD;  Location: [place] CV LAB;  Service: Cardiovascular;  Laterality: Right;   LOWER EXTREMITY ANGIOGRAPHY Right 05/23/2021   Procedure: LOWER EXTREMITY ANGIOGRAPHY;  Surgeon: Algernon Huxley, MD;  Location: [place] CV LAB;  Service: Cardiovascular;  Laterality: Right;   PERIPHERAL VASCULAR CATHETERIZATION Left 01/18/2015   Procedure: Lower Extremity Angiography;  Surgeon: Algernon Huxley, MD;  Location: [place] CV LAB;  Service: Cardiovascular;  Laterality: Left;   PERIPHERAL VASCULAR CATHETERIZATION N/A 01/18/2015   Procedure: Lower Extremity Intervention;  Surgeon: Algernon Huxley, MD;  Location: [place] CV LAB;  Service: Cardiovascular;  Laterality: N/A;   PERIPHERAL VASCULAR CATHETERIZATION  07/30/2015   Procedure: Lower Extremity Intervention;  Surgeon: Algernon Huxley, MD;  Location: [place] CV LAB;  Service: Cardiovascular;;   PERIPHERAL VASCULAR CATHETERIZATION N/A 07/30/2015   Procedure: Abdominal Aortogram w/Lower Extremity;  Surgeon: Algernon Huxley, MD;  Location: [place] CV LAB;  Service: Cardiovascular;  Laterality: N/A;   PERIPHERAL VASCULAR CATHETERIZATION Left 08/22/2015   Procedure: Lower Extremity Angiography;  Surgeon: Algernon Huxley, MD;  Location: [place] CV LAB;  Service: Cardiovascular;  Laterality: Left;   PERIPHERAL VASCULAR CATHETERIZATION Left 08/22/2015   Procedure: Lower Extremity Intervention;  Surgeon: Algernon Huxley, MD;  Location: [place] CV LAB;  Service: Cardiovascular;  Laterality: Left;   PERIPHERAL VASCULAR CATHETERIZATION Right 03/31/2016   Procedure: Lower Extremity Angiography;  Surgeon: Algernon Huxley, MD;  Location: [place] CV LAB;  Service: Cardiovascular;  Laterality: Right;   PERIPHERAL VASCULAR CATHETERIZATION  03/31/2016   Procedure: Lower Extremity Intervention;  Surgeon: Algernon Huxley, MD;  Location: [place] CV LAB;  Service: Cardiovascular;;   PERIPHERAL VASCULAR CATHETERIZATION Left 04/10/2016   Procedure: Lower Extremity Angiography;  Surgeon: Algernon Huxley, MD;  Location: [place] CV LAB;  Service: Cardiovascular;  Laterality: Left;   VACUUM ASSISTED CLOSURE CHANGE Left 10/03/2019   Procedure: LEFT THIGH VACUUM ASSISTED CLOSURE CHANGE;  Surgeon: Algernon Huxley, MD;  Location: [facility];  Service: General;  Laterality: Left;   WOUND DEBRIDEMENT Left 10/18/2015   Procedure: DEBRIDEMENT WOUND   ( LEFT BKA DEBRIDEMENT );  Surgeon: Algernon Huxley, MD;  Location: [facility];  Service: Vascular;  Laterality: Left;    Family History  Problem Relation Age of Onset   Heart attack Mother    Varicose Veins Neg Hx     No Known Allergies  CBC Latest Ref Rng & Units 06/14/2021 03/22/2021 03/21/2021  WBC 4.0 - 10.5 K/uL 11.5(H) 14.6(H) 14.7(H)  Hemoglobin 13.0 - 17.0 g/dL 13.9 14.3 15.3  Hematocrit 39.0 - 52.0 %  43.9 44.3 47.1  Platelets 150 - 400 K/uL 348 229 229      CMP     Component Value Date/Time   NA 137 06/14/2021 1139   NA 136 12/01/2013 1317   K 4.1 06/14/2021 1139   K 3.9 12/01/2013 1317   CL 102 06/14/2021 1139   CL 100 12/01/2013 1317   CO2 28 06/14/2021 1139   CO2 33 (H) 12/01/2013 1317   GLUCOSE 116 (H) 06/14/2021 1139  GLUCOSE 91 12/01/2013 1317   BUN 14 06/14/2021 1139   BUN 24 (H) 09/25/2014 0846   CREATININE 1.20 06/14/2021 1139   CREATININE 1.29 09/25/2014 0846   CALCIUM 8.7 (L) 06/14/2021 1139   CALCIUM 9.4 12/01/2013 1317   PROT 7.9 06/14/2021 1139   PROT 6.5 03/25/2013 0351   ALBUMIN 3.9 06/14/2021 1139   ALBUMIN 1.2 (L) 03/25/2013 0351   AST 15 06/14/2021 1139   AST 45 (H) 03/25/2013 0351   ALT 11 06/14/2021 1139   ALT 35 03/25/2013 0351   ALKPHOS 116 06/14/2021 1139   ALKPHOS 71 03/25/2013 0351   BILITOT 0.4 06/14/2021 1139   BILITOT 0.3 03/25/2013 0351   GFRNONAA >60 06/14/2021 1139   GFRNONAA 60 (L) 09/25/2014 0846   GFRNONAA >60 03/16/2014 0750   GFRAA >60 04/30/2020 0650   GFRAA >60 09/25/2014 0846   GFRAA >60 03/16/2014 0750     VAS [place] ABI WITH/WO TBI  Result Date: 06/18/2021  LOWER EXTREMITY DOPPLER STUDY Patient Name:  KAIREN HALLINAN  Date of Exam:   06/18/2021 Medical Rec #: [id number]          Accession #:    [id number] Date of Birth: October 09, 1950          Patient Gender: M Patient Age:   20 years Exam Location:  [facility] Procedure:      VAS [place] ABI WITH/WO TBI Referring Phys: [name] --------------------------------------------------------------------------------  Indications: Peripheral artery disease, and Lt AKA.  Vascular Interventions: Multiple interventions 2014-17. Rt dis SFA-PTA stent                         10/04/2018 PTA left EIA. PTA right EIA. PTA right                         popliteal artery. PTA right poximal to mid ATA. PTA of                         right PTA from tibioperoneal trunk to foot. PTA of right                          peroneal artery and tibioperoneal trunk.                          03/21/2021: Aortogram and Selective Right Lower                         Extremity Angiogram. Mechanical Trhombectomy of the                         Right SFA, Popliteal Artery,Anterior Tibial Artery and                         Peroneal Artery with penumbra CAT 6 device. PTA of the                         Right Anterior Tibial Artery with 2.5 mm diameter                         angioplasty balloon. PTA of the Right Peronael Artery  with 3 mm diameter angioplasty balloon. PTA of the                         entire Right SFA and Popliteal Artery with multiple                         inflations with a 5 mm dismeter by 22 cm length Lutonix                         drug coated angioplasty balloon. Stent placementto the                         Right Proximal SFA with 6 mm diameter by 2.5 cm length                         Viabahn stent. Stent placement to the Right Popliteal                         Artery and Distal SFA with 6 mm diameter by 25 cm length                         Viabahn Stent. Comparison Study: 05/15/2021 Performing Technologist: Charlane Ferretti RT (R)(VS)  Examination Guidelines: A complete evaluation includes at minimum, Doppler waveform signals and systolic blood pressure reading at the level of bilateral brachial, anterior tibial, and posterior tibial arteries, when vessel segments are accessible. Bilateral testing is considered an integral part of a complete examination. Photoelectric Plethysmograph (PPG) waveforms and toe systolic pressure readings are included as required and additional duplex testing as needed. Limited examinations for reoccurring indications may be performed as noted.  ABI Findings: +---------+------------------+-----+----------+---------+ Right    Rt Pressure (mmHg)IndexWaveform  Comment   +---------+------------------+-----+----------+---------+ Brachial 154                                         +---------+------------------+-----+----------+---------+ ATA      53                     monophasic          +---------+------------------+-----+----------+---------+ PTA      60                0.38 monophasic          +---------+------------------+-----+----------+---------+ Great Toe                                 Amputated +---------+------------------+-----+----------+---------+ +--------+------------------+-----+--------+-------+ Left    Lt Pressure (mmHg)IndexWaveformComment +--------+------------------+-----+--------+-------+ [id number]                            AKA     +--------+------------------+-----+--------+-------+ +-------+-----------+-----------+------------+------------+ ABI/TBIToday's ABIToday's TBIPrevious ABIPrevious TBI +-------+-----------+-----------+------------+------------+ Right  .38                   .42         .69          +-------+-----------+-----------+------------+------------+ Right ABIs appear essentially unchanged compared to prior study on 05/15/2021.  Summary: Right: Resting right ankle-brachial index indicates severe right lower extremity  arterial disease. *See table(s) above for measurements and observations.  Electronically signed by [name] MD on 06/18/2021 at 2:51:38 PM.    Final    VAS [place] ABI WITH/WO TBI  Result Date: 05/17/2021  LOWER EXTREMITY DOPPLER STUDY Patient Name:  RIDLEY SCHEWE  Date of Exam:   05/15/2021 Medical Rec #: [id number]          Accession #:    [id number] Date of Birth: [birth year]          Patient Gender: M Patient Age:   18 years Exam Location:  [facility] Procedure:      VAS [place] ABI WITH/WO TBI Referring Phys: Corene Cornea DEW --------------------------------------------------------------------------------  Indications: Peripheral artery disease, and Lt AKA.  Vascular Interventions: Multiple interventions 2014-17. Rt dis SFA-PTA stent                          10/04/2018 PTA left EIA. PTA right EIA. PTA right                         popliteal artery. PTA right poximal to mid ATA. PTA of                         right PTA from tibioperoneal trunk to foot. PTA of right                         peroneal artery and tibioperoneal trunk.                          03/21/2021: Aortogram and Selective Right Lower                         Extremity Angiogram. Mechanical Trhombectomy of the                         Right SFA, Popliteal Artery,Anterior Tibial Artery and                         Peroneal Artery with penumbra CAT 6 device. PTA of the                         Right Anterior Tibial Artery with 2.5 mm diameter                         angioplasty balloon. PTA of the Right Peronael Artery                         with 3 mm diameter angioplasty balloon. PTA of the                         entire Right SFA and Popliteal Artery with multiple                         inflations with a 5 mm dismeter by 22 cm length Lutonix                         drug coated angioplasty balloon. Stent placementto the  Right Proximal SFA with 6 mm diameter by 2.5 cm length                         Viabahn stent. Stent placement to the Right Popliteal                         Artery and Distal SFA with 6 mm diameter by 25 cm length                         Viabahn Stent. Comparison Study: 12/18/2020 Performing Technologist: Almira Coaster RVS  Examination Guidelines: A complete evaluation includes at minimum, Doppler waveform signals and systolic blood pressure reading at the level of bilateral brachial, anterior tibial, and posterior tibial arteries, when vessel segments are accessible. Bilateral testing is considered an integral part of a complete examination. Photoelectric Plethysmograph (PPG) waveforms and toe systolic pressure readings are included as required and additional duplex testing as needed. Limited examinations for reoccurring indications may be performed as noted.  ABI  Findings: +---------+------------------+-----+--------+--------+ Right    Rt Pressure (mmHg)IndexWaveformComment  +---------+------------------+-----+--------+--------+ Brachial 145                                     +---------+------------------+-----+--------+--------+ ATA      46                             .32      +---------+------------------+-----+--------+--------+ PTA      61                0.42                  +---------+------------------+-----+--------+--------+ Great Toe0                 0.00 Absent           +---------+------------------+-----+--------+--------+ +--------+------------------+-----+--------+-------+ Left    Lt Pressure (mmHg)IndexWaveformComment +--------+------------------+-----+--------+-------+ [id number]                                    +--------+------------------+-----+--------+-------+ +-------+-----------+-----------+------------+------------+ ABI/TBIToday's ABIToday's TBIPrevious ABIPrevious TBI +-------+-----------+-----------+------------+------------+ Right  .42                   .69                      +-------+-----------+-----------+------------+------------+ Right ABIs appear decreased compared to prior study on 12/18/2020.  Summary: Right: Resting right ankle-brachial index indicates severe right lower extremity arterial disease. The right toe-brachial index is abnormal.  *See table(s) above for measurements and observations.  Electronically signed by [name] MD on 05/17/2021 at 9:05:13 AM.    Final        Assessment & Plan:   1. PAD (peripheral artery disease) ([place]) Unfortunately consecutive interventions have not been successful at restoring the patient's perfusion.  The patient has severe tibial level disease despite intervention.  Based on this I had a discussion with the patient and we discussed that he can undergo repeat intervention however it is likely this would not severe tibial level disease.   The second option was to continue and try to tolerate pain as long as possible with an ultimate destination being amputation.  Based on this the patient elects to not proceed at this time, we will have the patient return in 3 months with noninvasive studies or sooner if issues arise.  2. Essential hypertension Continue antihypertensive medications as already ordered, these medications have been reviewed and there are no changes at this time.   3. Hyperlipidemia, unspecified hyperlipidemia type Continue statin as ordered and reviewed, no changes at this time    Current Outpatient Medications on File Prior to Visit  Medication Sig Dispense Refill   acetaminophen (TYLENOL) 325 MG tablet Take 650 mg by mouth every 4 (four) hours as needed for fever.      acidophilus (RISAQUAD) CAPS capsule Take 1 capsule by mouth 2 (two) times daily.     ammonium lactate (AMLACTIN) 12 % cream Apply topically 2 (two) times daily. Apply to right foot     apixaban (ELIQUIS) 2.5 MG TABS tablet Take 1 tablet (2.5 mg total) by mouth 2 (two) times daily. 60 tablet 3   ascorbic acid (VITAMIN C) 500 MG tablet Take 500 mg by mouth 2 (two) times daily.     aspirin EC 81 MG EC tablet Take 1 tablet (81 mg total) by mouth daily. Swallow whole. 90 tablet 3   baclofen (LIORESAL) 10 MG tablet Take 10 mg by mouth 3 (three) times daily. Hold for sedation     brimonidine-timolol (COMBIGAN) 0.2-0.5 % ophthalmic solution Place 1 drop into the right eye 2 (two) times daily.     ciprofloxacin (CIPRO) 500 MG tablet Take 500 mg by mouth 2 (two) times daily. Twice a day for 10 days     docusate sodium (COLACE) 100 MG capsule Take 100 mg by mouth daily as needed for mild constipation.     doxycycline (MONODOX) 100 MG capsule Take 100 mg by mouth 2 (two) times daily. 1 capsule Twice a day for 10 days     fentaNYL (DURAGESIC) 25 MCG/HR Place 1 patch onto the skin every 3 (three) days.      ferrous sulfate 324 (65 Fe) MG TBEC Take 324 mg by  mouth 2 (two) times daily.      gabapentin (NEURONTIN) 100 MG capsule Take 100 mg by mouth at bedtime.      guaiFENesin (ROBITUSSIN) 100 MG/5ML SOLN Take 15 mLs by mouth 3 (three) times daily as needed for cough (congestion).     ketorolac (ACULAR) 0.5 % ophthalmic solution Place 1 drop into the left eye 2 (two) times daily.     loperamide (IMODIUM) 2 MG capsule Take 2 mg by mouth 3 (three) times daily as needed for diarrhea or loose stools.     Melatonin 5 MG TABS Take 5 mg by mouth at bedtime as needed (insomnia).      Morphine Sulfate (MORPHINE CONCENTRATE) 10 mg / 0.5 ml concentrated solution      nitroGLYCERIN (NITROSTAT) 0.4 MG SL tablet Place 0.14 mg under the tongue every 5 (five) minutes as needed for chest pain.     Omega-3 Fatty Acids (FISH OIL) 1000 MG CAPS Take 1,000 mg by mouth daily.      Oxycodone HCl 10 MG TABS Take 10 mg by mouth every 4 (four) hours as needed (pain).      oxymetazoline (AFRIN) 0.05 % nasal spray Place 2 sprays into both nostrils 2 (two) times daily as needed (For nose bleeds).     potassium chloride SA (K-DUR,KLOR-CON) 20 MEQ tablet Take 20 mEq by mouth daily. With or after meal  sodium chloride (OCEAN) 0.65 % SOLN nasal spray Place 2 sprays into both nostrils 2 (two) times daily as needed for congestion.     tamsulosin (FLOMAX) 0.4 MG CAPS capsule Take 1 capsule (0.4 mg total) by mouth daily. 30 capsule 0   TRELEGY ELLIPTA 100-62.5-25 MCG/INH AEPB Inhale 1 puff into the lungs every morning.      atorvastatin (LIPITOR) 10 MG tablet Take 1 tablet (10 mg total) by mouth daily. (Patient taking differently: Take 10 mg by mouth at bedtime.) 30 tablet 11   pantoprazole (PROTONIX) 40 MG tablet Take 1 tablet (40 mg total) by mouth 2 (two) times daily. 60 tablet 0   No current facility-administered medications on file prior to visit.    There are no Patient Instructions on file for this visit. No follow-ups on file.   Kris Hartmann, NP

## 2021-06-30 ENCOUNTER — Encounter (INDEPENDENT_AMBULATORY_CARE_PROVIDER_SITE_OTHER): Payer: Self-pay | Admitting: Nurse Practitioner

## 2021-07-05 ENCOUNTER — Ambulatory Visit (INDEPENDENT_AMBULATORY_CARE_PROVIDER_SITE_OTHER): Payer: Medicare Other | Admitting: Vascular Surgery

## 2021-07-05 ENCOUNTER — Other Ambulatory Visit: Payer: Self-pay

## 2021-07-05 VITALS — BP 132/74 | HR 91 | Ht 73.0 in | Wt 224.0 lb

## 2021-07-05 DIAGNOSIS — I70235 Atherosclerosis of native arteries of right leg with ulceration of other part of foot: Secondary | ICD-10-CM

## 2021-07-05 DIAGNOSIS — I10 Essential (primary) hypertension: Secondary | ICD-10-CM | POA: Diagnosis not present

## 2021-07-05 DIAGNOSIS — I70221 Atherosclerosis of native arteries of extremities with rest pain, right leg: Secondary | ICD-10-CM | POA: Diagnosis not present

## 2021-07-05 DIAGNOSIS — Z89612 Acquired absence of left leg above knee: Secondary | ICD-10-CM | POA: Diagnosis not present

## 2021-07-05 DIAGNOSIS — E785 Hyperlipidemia, unspecified: Secondary | ICD-10-CM

## 2021-07-05 NOTE — Assessment & Plan Note (Signed)
lipid control important in reducing the progression of atherosclerotic disease. Continue statin therapy  

## 2021-07-05 NOTE — H&P (View-Only) (Signed)
MRN : [id number]  Timothy Juarez is a 70 y.o. (19-Mar-1951) male who presents with chief complaint of  Chief Complaint  Patient presents with   Follow-up    FU to discuss Amputation  .  History of Present Illness: Patient returns today in follow up of his PAD with ulceration and rest pain of the right foot.  He was seen a couple of weeks ago and the topic of amputation was broached and he has considered this and is now ready to proceed with amputation.  His pain is essentially unbearable.  The ulceration is gradually getting worse.  No fevers or chills or signs of systemic infection.  Current Outpatient Medications  Medication Sig Dispense Refill   acetaminophen (TYLENOL) 325 MG tablet Take 650 mg by mouth every 4 (four) hours as needed for fever.      acidophilus (RISAQUAD) CAPS capsule Take 1 capsule by mouth 2 (two) times daily.     ammonium lactate (AMLACTIN) 12 % cream Apply topically 2 (two) times daily. Apply to right foot     apixaban (ELIQUIS) 2.5 MG TABS tablet Take 1 tablet (2.5 mg total) by mouth 2 (two) times daily. 60 tablet 3   ascorbic acid (VITAMIN C) 500 MG tablet Take 500 mg by mouth 2 (two) times daily.     aspirin EC 81 MG EC tablet Take 1 tablet (81 mg total) by mouth daily. Swallow whole. 90 tablet 3   baclofen (LIORESAL) 10 MG tablet Take 10 mg by mouth 3 (three) times daily. Hold for sedation     brimonidine-timolol (COMBIGAN) 0.2-0.5 % ophthalmic solution Place 1 drop into the right eye 2 (two) times daily.     ciprofloxacin (CIPRO) 500 MG tablet Take 500 mg by mouth 2 (two) times daily. Twice a day for 10 days     docusate sodium (COLACE) 100 MG capsule Take 100 mg by mouth daily as needed for mild constipation.     doxycycline (MONODOX) 100 MG capsule Take 100 mg by mouth 2 (two) times daily. 1 capsule Twice a day for 10 days     fentaNYL (DURAGESIC) 25 MCG/HR Place 1 patch onto the skin every 3 (three) days.      ferrous sulfate 324 (65 Fe) MG TBEC Take 324  mg by mouth 2 (two) times daily.      gabapentin (NEURONTIN) 100 MG capsule Take 100 mg by mouth at bedtime.      guaiFENesin (ROBITUSSIN) 100 MG/5ML SOLN Take 15 mLs by mouth 3 (three) times daily as needed for cough (congestion).     ketorolac (ACULAR) 0.5 % ophthalmic solution Place 1 drop into the left eye 2 (two) times daily.     loperamide (IMODIUM) 2 MG capsule Take 2 mg by mouth 3 (three) times daily as needed for diarrhea or loose stools.     Melatonin 5 MG TABS Take 5 mg by mouth at bedtime as needed (insomnia).      Morphine Sulfate (MORPHINE CONCENTRATE) 10 mg / 0.5 ml concentrated solution      nitroGLYCERIN (NITROSTAT) 0.4 MG SL tablet Place 0.14 mg under the tongue every 5 (five) minutes as needed for chest pain.     Omega-3 Fatty Acids (FISH OIL) 1000 MG CAPS Take 1,000 mg by mouth daily.      Oxycodone HCl 10 MG TABS Take 10 mg by mouth every 4 (four) hours as needed (pain).      oxymetazoline (AFRIN) 0.05 % nasal spray Place 2  sprays into both nostrils 2 (two) times daily as needed (For nose bleeds).     potassium chloride SA (K-DUR,KLOR-CON) 20 MEQ tablet Take 20 mEq by mouth daily. With or after meal     sodium chloride (OCEAN) 0.65 % SOLN nasal spray Place 2 sprays into both nostrils 2 (two) times daily as needed for congestion.     tamsulosin (FLOMAX) 0.4 MG CAPS capsule Take 1 capsule (0.4 mg total) by mouth daily. 30 capsule 0   TRELEGY ELLIPTA 100-62.5-25 MCG/INH AEPB Inhale 1 puff into the lungs every morning.      atorvastatin (LIPITOR) 10 MG tablet Take 1 tablet (10 mg total) by mouth daily. (Patient taking differently: Take 10 mg by mouth at bedtime.) 30 tablet 11   pantoprazole (PROTONIX) 40 MG tablet Take 1 tablet (40 mg total) by mouth 2 (two) times daily. 60 tablet 0   No current facility-administered medications for this visit.    Past Medical History:  Diagnosis Date   Acute embolism and thombos unsp deep vn unsp lower extremity (HCC)    Acute respiratory  failure (HCC)    AKI (acute kidney injury) ([place]) 12/27/2018   Allergy    Anemia    Aneurysm of unspecified site ([place])    ARF (acute respiratory failure) (HCC)    H/O   Bronchitis    Cellulitis and abscess of leg 09/27/2019   CHF (congestive heart failure) (HCC)    COPD (chronic obstructive pulmonary disease) (HCC)    Cough    Epistaxis    Gastrointestinal hemorrhage    GERD (gastroesophageal reflux disease)    Gout    Hematemesis 04/27/2020   High anion gap metabolic acidosis 04/10/2352   Hyperlipidemia    Hypertension    Hypokalemia    Infection of above knee amputation stump ([place]) 09/27/2019   Insomnia    Iron deficiency anemia 04/21/2017   Muscle contracture    MUSCLE SPASMS, muscle weakness   Peripheral vascular disease (HCC)    Pneumonia    Pressure ulcer    Severe sepsis ([place]) 12/27/2018   Toxic encephalopathy 12/27/2018    Past Surgical History:  Procedure Laterality Date   AMPUTATION Left 09/19/2015   Procedure: AMPUTATION BELOW KNEE;  Surgeon: Algernon Huxley, MD;  Location: [facility];  Service: Vascular;  Laterality: Left;   AMPUTATION Left 11/01/2015   Procedure: AMPUTATION ABOVE KNEE;  Surgeon: Algernon Huxley, MD;  Location: [facility];  Service: Vascular;  Laterality: Left;   AMPUTATION Left 09/29/2019   Procedure: IRRIGATION AND DEBRIDEMENT OF LEFT AKA;  Surgeon: Algernon Huxley, MD;  Location: [facility];  Service: General;  Laterality: Left;   APPLICATION OF WOUND VAC Left 10/18/2015   Procedure: APPLICATION OF WOUND VAC;  Surgeon: Algernon Huxley, MD;  Location: [facility];  Service: Vascular;  Laterality: Left;   APPLICATION OF WOUND VAC Left 09/30/2019   Procedure: APPLICATION OF WOUND VAC;  Surgeon: Katha Cabal, MD;  Location: [facility];  Service: Vascular;  Laterality: Left;  serial # [id number]   [id number] CATHETERIZATION     CATARACT EXTRACTION W/PHACO Right 08/18/2019   Procedure: CATARACT EXTRACTION PHACO AND INTRAOCULAR LENS PLACEMENT ([place]) RIGHT;  Surgeon: Birder Robson, MD;   Location: [facility];  Service: Ophthalmology;  Laterality: Right;  [place] 03:29.0 CDE 45.68 Fluid Pack Lot # [id number]   CENTRAL VENOUS CATHETER INSERTION Right 09/30/2019   Procedure: INSERTION CENTRAL LINE ADULT;  Surgeon: Katha Cabal, MD;  Location: [facility];  Service: Vascular;  Laterality: Right;   COLONOSCOPY WITH PROPOFOL N/A 05/25/2017   Procedure: COLONOSCOPY WITH PROPOFOL;  Surgeon: Jonathon Bellows, MD;  Location: [facility];  Service: Gastroenterology;  Laterality: N/A;   COLONOSCOPY WITH PROPOFOL N/A 10/14/2017   Procedure: COLONOSCOPY WITH PROPOFOL;  Surgeon: Jonathon Bellows, MD;  Location: [facility] ENDOSCOPY;  Service: Gastroenterology;  Laterality: N/A;   ESOPHAGOGASTRODUODENOSCOPY (EGD) WITH PROPOFOL N/A 05/25/2017   Procedure: ESOPHAGOGASTRODUODENOSCOPY (EGD) WITH PROPOFOL;  Surgeon: Jonathon Bellows, MD;  Location: [facility] ENDOSCOPY;  Service: Gastroenterology;  Laterality: N/A;   ESOPHAGOGASTRODUODENOSCOPY (EGD) WITH PROPOFOL N/A 10/14/2017   Procedure: ESOPHAGOGASTRODUODENOSCOPY (EGD) WITH PROPOFOL;  Surgeon: Jonathon Bellows, MD;  Location: [facility] ENDOSCOPY;  Service: Gastroenterology;  Laterality: N/A;   ESOPHAGOGASTRODUODENOSCOPY (EGD) WITH PROPOFOL N/A 04/27/2020   Procedure: ESOPHAGOGASTRODUODENOSCOPY (EGD) WITH PROPOFOL;  Surgeon: Lin Landsman, MD;  Location: [facility] ENDOSCOPY;  Service: Gastroenterology;  Laterality: N/A;   ESOPHAGOGASTRODUODENOSCOPY (EGD) WITH PROPOFOL N/A 06/18/2020   Procedure: ESOPHAGOGASTRODUODENOSCOPY (EGD) WITH PROPOFOL;  Surgeon: Lin Landsman, MD;  Location: [facility] ENDOSCOPY;  Service: Gastroenterology;  Laterality: N/A;   EYE SURGERY     GIVENS CAPSULE STUDY N/A 07/14/2017   Procedure: GIVENS CAPSULE STUDY;  Surgeon: Jonathon Bellows, MD;  Location: [facility] ENDOSCOPY;  Service: Gastroenterology;  Laterality: N/A;   GIVENS CAPSULE STUDY N/A 10/30/2017   Procedure: GIVENS CAPSULE STUDY 12 HR;  Surgeon: Jonathon Bellows, MD;  Location: [facility] ENDOSCOPY;  Service: Gastroenterology;   Laterality: N/A;   IVC FILTER INSERTION N/A 10/12/2017   Procedure: IVC FILTER INSERTION;  Surgeon: Algernon Huxley, MD;  Location: [place] CV LAB;  Service: Cardiovascular;  Laterality: N/A;   LOWER EXTREMITY ANGIOGRAPHY Right 10/04/2018   Procedure: LOWER EXTREMITY ANGIOGRAPHY;  Surgeon: Algernon Huxley, MD;  Location: [place] CV LAB;  Service: Cardiovascular;  Laterality: Right;   LOWER EXTREMITY ANGIOGRAPHY Right 03/21/2021   Procedure: LOWER EXTREMITY ANGIOGRAPHY;  Surgeon: Algernon Huxley, MD;  Location: [place] CV LAB;  Service: Cardiovascular;  Laterality: Right;   LOWER EXTREMITY ANGIOGRAPHY Right 05/23/2021   Procedure: LOWER EXTREMITY ANGIOGRAPHY;  Surgeon: Algernon Huxley, MD;  Location: [place] CV LAB;  Service: Cardiovascular;  Laterality: Right;   PERIPHERAL VASCULAR CATHETERIZATION Left 01/18/2015   Procedure: Lower Extremity Angiography;  Surgeon: Algernon Huxley, MD;  Location: [place] CV LAB;  Service: Cardiovascular;  Laterality: Left;   PERIPHERAL VASCULAR CATHETERIZATION N/A 01/18/2015   Procedure: Lower Extremity Intervention;  Surgeon: Algernon Huxley, MD;  Location: [place] CV LAB;  Service: Cardiovascular;  Laterality: N/A;   PERIPHERAL VASCULAR CATHETERIZATION  07/30/2015   Procedure: Lower Extremity Intervention;  Surgeon: Algernon Huxley, MD;  Location: [place] CV LAB;  Service: Cardiovascular;;   PERIPHERAL VASCULAR CATHETERIZATION N/A 07/30/2015   Procedure: Abdominal Aortogram w/Lower Extremity;  Surgeon: Algernon Huxley, MD;  Location: [place] CV LAB;  Service: Cardiovascular;  Laterality: N/A;   PERIPHERAL VASCULAR CATHETERIZATION Left 08/22/2015   Procedure: Lower Extremity Angiography;  Surgeon: Algernon Huxley, MD;  Location: [place] CV LAB;  Service: Cardiovascular;  Laterality: Left;   PERIPHERAL VASCULAR CATHETERIZATION Left 08/22/2015   Procedure: Lower Extremity Intervention;  Surgeon: Algernon Huxley, MD;  Location: [place] CV LAB;  Service:  Cardiovascular;  Laterality: Left;   PERIPHERAL VASCULAR CATHETERIZATION Right 03/31/2016   Procedure: Lower Extremity Angiography;  Surgeon: Algernon Huxley, MD;  Location: [place] CV LAB;  Service: Cardiovascular;  Laterality: Right;   PERIPHERAL VASCULAR CATHETERIZATION  03/31/2016   Procedure: Lower Extremity Intervention;  Surgeon:  Algernon Huxley, MD;  Location: [place] CV LAB;  Service: Cardiovascular;;   PERIPHERAL VASCULAR CATHETERIZATION Left 04/10/2016   Procedure: Lower Extremity Angiography;  Surgeon: Algernon Huxley, MD;  Location: [place] CV LAB;  Service: Cardiovascular;  Laterality: Left;   VACUUM ASSISTED CLOSURE CHANGE Left 10/03/2019   Procedure: LEFT THIGH VACUUM ASSISTED CLOSURE CHANGE;  Surgeon: Algernon Huxley, MD;  Location: [facility];  Service: General;  Laterality: Left;   WOUND DEBRIDEMENT Left 10/18/2015   Procedure: DEBRIDEMENT WOUND   ( LEFT BKA DEBRIDEMENT );  Surgeon: Algernon Huxley, MD;  Location: [facility];  Service: Vascular;  Laterality: Left;     Social History   Tobacco Use   Smoking status: Former    Packs/day: 1.00    Years: 44.00    Pack years: 44.00    Types: Cigarettes    Quit date: 11/17/2012    Years since quitting: 8.6   Smokeless tobacco: Never  Vaping Use   Vaping Use: Never used  Substance Use Topics   Alcohol use: Not Currently   Drug use: Never       Family History  Problem Relation Age of Onset   Heart attack Mother    Varicose Veins Neg Hx      No Known Allergies   REVIEW OF SYSTEMS (Negative unless checked)  Constitutional: [] Weight loss  [] Fever  [] Chills Cardiac: [] Chest pain   [] Chest pressure   [] Palpitations   [] Shortness of breath when laying flat   [] Shortness of breath at rest   [] Shortness of breath with exertion. Vascular:  [] Pain in legs with walking   [] Pain in legs at rest   [] Pain in legs when laying flat   [] Claudication   [] Pain in feet when walking  [x] Pain in feet at rest  [x] Pain in feet when laying flat    [] History of DVT   [] Phlebitis   [x] Swelling in legs   [] Varicose veins   [x] Non-healing ulcers Pulmonary:   [] Uses home oxygen   [] Productive cough   [] Hemoptysis   [] Wheeze  [] COPD   [] Asthma Neurologic:  [] Dizziness  [] Blackouts   [] Seizures   [] History of stroke   [] History of TIA  [] Aphasia   [] Temporary blindness   [] Dysphagia   [] Weakness or numbness in arms   [] Weakness or numbness in legs Musculoskeletal:  [x] Arthritis   [] Joint swelling   [x] Joint pain   [] Low back pain Hematologic:  [] Easy bruising  [] Easy bleeding   [] Hypercoagulable state   [] Anemic   Gastrointestinal:  [] Blood in stool   [] Vomiting blood  [x] Gastroesophageal reflux/heartburn   [] Abdominal pain Genitourinary:  [] Chronic kidney disease   [] Difficult urination  [] Frequent urination  [] Burning with urination   [] Hematuria Skin:  [] Rashes   [x] Ulcers   [x] Wounds Psychological:  [] History of anxiety   []  History of major depression.  Physical Examination  BP 132/74   Pulse 91   Ht 6\' 1"  (1.854 m)   Wt 224 lb (101.6 kg)   BMI 29.55 kg/m  Gen:  WD/WN, NAD Head: [place]/AT, No temporalis wasting. Ear/Nose/Throat: Hearing grossly intact, nares w/o erythema or drainage Eyes: Conjunctiva clear. Sclera non-icteric Neck: Supple.  Trachea midline Pulmonary:  Good air movement, no use of accessory muscles.  Cardiac: Irregular Vascular:  Vessel Right Left  Radial Palpable Palpable                          PT Not palpable Not palpable  DP  Not palpable Not palpable   Gastrointestinal: soft, non-tender/non-distended. No guarding/reflex.  Musculoskeletal: M/S 5/5 throughout.  No deformity or atrophy.  Left AKA well-healed.  2+ right lower extremity edema.  Wounds are currently dressed Neurologic: Sensation grossly intact in extremities.  Symmetrical.  Speech is fluent.  Psychiatric: Judgment intact, Mood & affect appropriate for pt's clinical situation. Dermatologic: Right foot wound is currently  dressed      Labs Recent Results (from the past 2160 hour(s))  BUN     Status: None   Collection Time: 05/23/21  8:51 AM  Result Value Ref Range   BUN 19 8 - 23 mg/dL    Comment: Performed at [facility], [address] [place], [place] [address]  Creatinine, serum     Status: None   Collection Time: 05/23/21  8:51 AM  Result Value Ref Range   Creatinine, Ser 1.18 0.61 - 1.24 mg/dL   GFR, Estimated >60 >60 mL/min    Comment: (NOTE) Calculated using the CKD-EPI Creatinine Equation (2021) Performed at [facility], [address] [place], [place] [address]   Ferritin     Status: None   Collection Time: 06/14/21 11:39 AM  Result Value Ref Range   Ferritin 93 24 - 336 ng/mL    Comment: Performed at [facility], [address] [place], [place] [address]  Iron and TIBC     Status: Abnormal   Collection Time: 06/14/21 11:39 AM  Result Value Ref Range   Iron 37 (L) 45 - 182 ug/dL   TIBC 312 250 - 450 ug/dL   Saturation Ratios 12 (L) 17.9 - 39.5 %   UIBC 275 ug/dL    Comment: Performed at [facility], [address] [place], [place] [address]  Comprehensive metabolic panel     Status: Abnormal   Collection Time: 06/14/21 11:39 AM  Result Value Ref Range   Sodium 137 135 - 145 mmol/L   Potassium 4.1 3.5 - 5.1 mmol/L   Chloride 102 98 - 111 mmol/L   CO2 28 22 - 32 mmol/L   Glucose, Bld 116 (H) 70 - 99 mg/dL    Comment: Glucose reference range applies only to samples taken after fasting for at least 8 hours.   BUN 14 8 - 23 mg/dL   Creatinine, Ser 1.20 0.61 - 1.24 mg/dL   Calcium 8.7 (L) 8.9 - 10.3 mg/dL   Total Protein 7.9 6.5 - 8.1 g/dL   Albumin 3.9 3.5 - 5.0 g/dL   AST 15 15 - 41 U/L   ALT 11 0 - 44 U/L   Alkaline Phosphatase 116 38 - 126 U/L   Total Bilirubin 0.4 0.3 - 1.2 mg/dL   GFR, Estimated >60 >60 mL/min    Comment: (NOTE) Calculated using the CKD-EPI Creatinine Equation (2021)    Anion gap 7 5 - 15    Comment:  Performed at [facility], [address] [place],  [address]  CBC with Differential     Status: Abnormal   Collection Time: 06/14/21 11:39 AM  Result Value Ref Range   WBC 11.5 (H) 4.0 - 10.5 K/uL   RBC 5.37 4.22 - 5.81 MIL/uL   Hemoglobin 13.9 13.0 - 17.0 g/dL   HCT 43.9 39.0 - 52.0 %   MCV 81.8 80.0 - 100.0 fL   MCH 25.9 (L) 26.0 - 34.0 pg   MCHC 31.7 30.0 - 36.0 g/dL   RDW 15.6 (H) 11.5 - 15.5 %   Platelets  348 150 - 400 K/uL   nRBC 0.0 0.0 - 0.2 %   Neutrophils Relative % 74 %   Neutro Abs 8.6 (H) 1.7 - 7.7 K/uL   Lymphocytes Relative 16 %   Lymphs Abs 1.9 0.7 - 4.0 K/uL   Monocytes Relative 6 %   Monocytes Absolute 0.7 0.1 - 1.0 K/uL   Eosinophils Relative 2 %   Eosinophils Absolute 0.2 0.0 - 0.5 K/uL   Basophils Relative 1 %   Basophils Absolute 0.1 0.0 - 0.1 K/uL   Immature Granulocytes 1 %   Abs Immature Granulocytes 0.07 0.00 - 0.07 K/uL    Comment: Performed at [facility], [address] [place], [place] [address]    Radiology VAS [place] ABI WITH/WO TBI  Result Date: 06/18/2021  LOWER EXTREMITY DOPPLER STUDY Patient Name:  Timothy Juarez  Date of Exam:   06/18/2021 Medical Rec #: [id number]          Accession #:    [id number] Date of Birth: 1951-01-13          Patient Gender: M Patient Age:   18 years Exam Location:  [facility] Procedure:      VAS [place] ABI WITH/WO TBI Referring Phys: Corene Cornea Paije Goodhart --------------------------------------------------------------------------------  Indications: Peripheral artery disease, and Lt AKA.  Vascular Interventions: Multiple interventions 2014-17. Rt dis SFA-PTA stent                         10/04/2018 PTA left EIA. PTA right EIA. PTA right                         popliteal artery. PTA right poximal to mid ATA. PTA of                         right PTA from tibioperoneal trunk to foot. PTA of right                         peroneal artery and tibioperoneal trunk.                          03/21/2021: Aortogram  and Selective Right Lower                         Extremity Angiogram. Mechanical Trhombectomy of the                         Right SFA, Popliteal Artery,Anterior Tibial Artery and                         Peroneal Artery with penumbra CAT 6 device. PTA of the                         Right Anterior Tibial Artery with 2.5 mm diameter                         angioplasty balloon. PTA of the Right Peronael Artery                         with 3 mm diameter angioplasty balloon. PTA of the  entire Right SFA and Popliteal Artery with multiple                         inflations with a 5 mm dismeter by 22 cm length Lutonix                         drug coated angioplasty balloon. Stent placementto the                         Right Proximal SFA with 6 mm diameter by 2.5 cm length                         Viabahn stent. Stent placement to the Right Popliteal                         Artery and Distal SFA with 6 mm diameter by 25 cm length                         Viabahn Stent. Comparison Study: 05/15/2021 Performing Technologist: Charlane Ferretti RT (R)(VS)  Examination Guidelines: A complete evaluation includes at minimum, Doppler waveform signals and systolic blood pressure reading at the level of bilateral brachial, anterior tibial, and posterior tibial arteries, when vessel segments are accessible. Bilateral testing is considered an integral part of a complete examination. Photoelectric Plethysmograph (PPG) waveforms and toe systolic pressure readings are included as required and additional duplex testing as needed. Limited examinations for reoccurring indications may be performed as noted.  ABI Findings: +---------+------------------+-----+----------+---------+ Right    Rt Pressure (mmHg)IndexWaveform  Comment   +---------+------------------+-----+----------+---------+ Brachial 154                                        +---------+------------------+-----+----------+---------+ ATA      53                      monophasic          +---------+------------------+-----+----------+---------+ PTA      60                0.38 monophasic          +---------+------------------+-----+----------+---------+ Great Toe                                 Amputated +---------+------------------+-----+----------+---------+ +--------+------------------+-----+--------+-------+ Left    Lt Pressure (mmHg)IndexWaveformComment +--------+------------------+-----+--------+-------+ [id number]                            AKA     +--------+------------------+-----+--------+-------+ +-------+-----------+-----------+------------+------------+ ABI/TBIToday's ABIToday's TBIPrevious ABIPrevious TBI +-------+-----------+-----------+------------+------------+ Right  .38                   .42         .69          +-------+-----------+-----------+------------+------------+ Right ABIs appear essentially unchanged compared to prior study on 05/15/2021.  Summary: Right: Resting right ankle-brachial index indicates severe right lower extremity arterial disease. *See table(s) above for measurements and observations.  Electronically signed by [name] MD on 06/18/2021 at 2:51:38 PM.    Final     Assessment/Plan  Essential hypertension  blood pressure control important in reducing the progression of atherosclerotic disease. On appropriate oral medications.   Hyperlipidemia lipid control important in reducing the progression of atherosclerotic disease. Continue statin therapy   Hx of AKA (above knee amputation), left [facility]) Now well healed  Atherosclerotic peripheral vascular disease with ulceration ([place]) Patient is now ready to proceed with right above-knee amputation.  I do not believe any revascularization is going to be durable as his runoff is extremely poor.  His pain is really unbearable at this point.  We will give him some pain medications get him on the schedule in the near future for right  above-knee amputation.    [name], MD  07/05/2021 10:27 AM    This note was created with Dragon medical transcription system.  Any errors from dictation are purely unintentional

## 2021-07-05 NOTE — Assessment & Plan Note (Signed)
blood pressure control important in reducing the progression of atherosclerotic disease. On appropriate oral medications.  

## 2021-07-05 NOTE — Progress Notes (Signed)
MRN : [id number]  Timothy Juarez is a 70 y.o. (May 11, 1951) male who presents with chief complaint of  Chief Complaint  Patient presents with   Follow-up    FU to discuss Amputation  .  History of Present Illness: Patient returns today in follow up of his PAD with ulceration and rest pain of the right foot.  He was seen a couple of weeks ago and the topic of amputation was broached and he has considered this and is now ready to proceed with amputation.  His pain is essentially unbearable.  The ulceration is gradually getting worse.  No fevers or chills or signs of systemic infection.  Current Outpatient Medications  Medication Sig Dispense Refill   acetaminophen (TYLENOL) 325 MG tablet Take 650 mg by mouth every 4 (four) hours as needed for fever.      acidophilus (RISAQUAD) CAPS capsule Take 1 capsule by mouth 2 (two) times daily.     ammonium lactate (AMLACTIN) 12 % cream Apply topically 2 (two) times daily. Apply to right foot     apixaban (ELIQUIS) 2.5 MG TABS tablet Take 1 tablet (2.5 mg total) by mouth 2 (two) times daily. 60 tablet 3   ascorbic acid (VITAMIN C) 500 MG tablet Take 500 mg by mouth 2 (two) times daily.     aspirin EC 81 MG EC tablet Take 1 tablet (81 mg total) by mouth daily. Swallow whole. 90 tablet 3   baclofen (LIORESAL) 10 MG tablet Take 10 mg by mouth 3 (three) times daily. Hold for sedation     brimonidine-timolol (COMBIGAN) 0.2-0.5 % ophthalmic solution Place 1 drop into the right eye 2 (two) times daily.     ciprofloxacin (CIPRO) 500 MG tablet Take 500 mg by mouth 2 (two) times daily. Twice a day for 10 days     docusate sodium (COLACE) 100 MG capsule Take 100 mg by mouth daily as needed for mild constipation.     doxycycline (MONODOX) 100 MG capsule Take 100 mg by mouth 2 (two) times daily. 1 capsule Twice a day for 10 days     fentaNYL (DURAGESIC) 25 MCG/HR Place 1 patch onto the skin every 3 (three) days.      ferrous sulfate 324 (65 Fe) MG TBEC Take 324  mg by mouth 2 (two) times daily.      gabapentin (NEURONTIN) 100 MG capsule Take 100 mg by mouth at bedtime.      guaiFENesin (ROBITUSSIN) 100 MG/5ML SOLN Take 15 mLs by mouth 3 (three) times daily as needed for cough (congestion).     ketorolac (ACULAR) 0.5 % ophthalmic solution Place 1 drop into the left eye 2 (two) times daily.     loperamide (IMODIUM) 2 MG capsule Take 2 mg by mouth 3 (three) times daily as needed for diarrhea or loose stools.     Melatonin 5 MG TABS Take 5 mg by mouth at bedtime as needed (insomnia).      Morphine Sulfate (MORPHINE CONCENTRATE) 10 mg / 0.5 ml concentrated solution      nitroGLYCERIN (NITROSTAT) 0.4 MG SL tablet Place 0.14 mg under the tongue every 5 (five) minutes as needed for chest pain.     Omega-3 Fatty Acids (FISH OIL) 1000 MG CAPS Take 1,000 mg by mouth daily.      Oxycodone HCl 10 MG TABS Take 10 mg by mouth every 4 (four) hours as needed (pain).      oxymetazoline (AFRIN) 0.05 % nasal spray Place 2  sprays into both nostrils 2 (two) times daily as needed (For nose bleeds).     potassium chloride SA (K-DUR,KLOR-CON) 20 MEQ tablet Take 20 mEq by mouth daily. With or after meal     sodium chloride (OCEAN) 0.65 % SOLN nasal spray Place 2 sprays into both nostrils 2 (two) times daily as needed for congestion.     tamsulosin (FLOMAX) 0.4 MG CAPS capsule Take 1 capsule (0.4 mg total) by mouth daily. 30 capsule 0   TRELEGY ELLIPTA 100-62.5-25 MCG/INH AEPB Inhale 1 puff into the lungs every morning.      atorvastatin (LIPITOR) 10 MG tablet Take 1 tablet (10 mg total) by mouth daily. (Patient taking differently: Take 10 mg by mouth at bedtime.) 30 tablet 11   pantoprazole (PROTONIX) 40 MG tablet Take 1 tablet (40 mg total) by mouth 2 (two) times daily. 60 tablet 0   No current facility-administered medications for this visit.    Past Medical History:  Diagnosis Date   Acute embolism and thombos unsp deep vn unsp lower extremity (HCC)    Acute respiratory  failure (HCC)    AKI (acute kidney injury) ([place]) 12/27/2018   Allergy    Anemia    Aneurysm of unspecified site ([place])    ARF (acute respiratory failure) (HCC)    H/O   Bronchitis    Cellulitis and abscess of leg 09/27/2019   CHF (congestive heart failure) (HCC)    COPD (chronic obstructive pulmonary disease) (HCC)    Cough    Epistaxis    Gastrointestinal hemorrhage    GERD (gastroesophageal reflux disease)    Gout    Hematemesis 04/27/2020   High anion gap metabolic acidosis 9/56/2130   Hyperlipidemia    Hypertension    Hypokalemia    Infection of above knee amputation stump ([place]) 09/27/2019   Insomnia    Iron deficiency anemia 04/21/2017   Muscle contracture    MUSCLE SPASMS, muscle weakness   Peripheral vascular disease (HCC)    Pneumonia    Pressure ulcer    Severe sepsis ([place]) 12/27/2018   Toxic encephalopathy 12/27/2018    Past Surgical History:  Procedure Laterality Date   AMPUTATION Left 09/19/2015   Procedure: AMPUTATION BELOW KNEE;  Surgeon: Algernon Huxley, MD;  Location: [facility];  Service: Vascular;  Laterality: Left;   AMPUTATION Left 11/01/2015   Procedure: AMPUTATION ABOVE KNEE;  Surgeon: Algernon Huxley, MD;  Location: [facility];  Service: Vascular;  Laterality: Left;   AMPUTATION Left 09/29/2019   Procedure: IRRIGATION AND DEBRIDEMENT OF LEFT AKA;  Surgeon: Algernon Huxley, MD;  Location: [facility];  Service: General;  Laterality: Left;   APPLICATION OF WOUND VAC Left 10/18/2015   Procedure: APPLICATION OF WOUND VAC;  Surgeon: Algernon Huxley, MD;  Location: [facility];  Service: Vascular;  Laterality: Left;   APPLICATION OF WOUND VAC Left 09/30/2019   Procedure: APPLICATION OF WOUND VAC;  Surgeon: Katha Cabal, MD;  Location: [facility];  Service: Vascular;  Laterality: Left;  serial # [id number]   EXBMWUX CATHETERIZATION     CATARACT EXTRACTION W/PHACO Right 08/18/2019   Procedure: CATARACT EXTRACTION PHACO AND INTRAOCULAR LENS PLACEMENT ([place]) RIGHT;  Surgeon: Birder Robson, MD;   Location: [facility];  Service: Ophthalmology;  Laterality: Right;  [place] 03:29.0 CDE 45.68 Fluid Pack Lot # [id number]   CENTRAL VENOUS CATHETER INSERTION Right 09/30/2019   Procedure: INSERTION CENTRAL LINE ADULT;  Surgeon: Katha Cabal, MD;  Location: [facility];  Service: Vascular;  Laterality: Right;   COLONOSCOPY WITH PROPOFOL N/A 05/25/2017   Procedure: COLONOSCOPY WITH PROPOFOL;  Surgeon: Jonathon Bellows, MD;  Location[facility];  Service: Gastroenterology;  Laterality: N/A;   COLONOSCOPY WITH PROPOFOL N/A 10/14/2017   Procedure: COLONOSCOPY WITH PROPOFOL;  Surgeon: Jonathon Bellows, MD;  Location: [facility] ENDOSCOPY;  Service: Gastroenterology;  Laterality: N/A;   ESOPHAGOGASTRODUODENOSCOPY (EGD) WITH PROPOFOL N/A 05/25/2017   Procedure: ESOPHAGOGASTRODUODENOSCOPY (EGD) WITH PROPOFOL;  Surgeon: Jonathon Bellows, MD;  Location: [facility];  Service: Gastroenterology;  Laterality: N/A;   ESOPHAGOGASTRODUODENOSCOPY (EGD) WITH PROPOFOL N/A 10/14/2017   Procedure: ESOPHAGOGASTRODUODENOSCOPY (EGD) WITH PROPOFOL;  Surgeon: Jonathon Bellows, MD;  Location: [facility] ENDOSCOPY;  Service: Gastroenterology;  Laterality: N/A;   ESOPHAGOGASTRODUODENOSCOPY (EGD) WITH PROPOFOL N/A 04/27/2020   Procedure: ESOPHAGOGASTRODUODENOSCOPY (EGD) WITH PROPOFOL;  Surgeon: Lin Landsman, MD;  Location: [facility] ENDOSCOPY;  Service: Gastroenterology;  Laterality: N/A;   ESOPHAGOGASTRODUODENOSCOPY (EGD) WITH PROPOFOL N/A 06/18/2020   Procedure: ESOPHAGOGASTRODUODENOSCOPY (EGD) WITH PROPOFOL;  Surgeon: Lin Landsman, MD;  Location: [facility] ENDOSCOPY;  Service: Gastroenterology;  Laterality: N/A;   EYE SURGERY     GIVENS CAPSULE STUDY N/A 07/14/2017   Procedure: GIVENS CAPSULE STUDY;  Surgeon: Jonathon Bellows, MD;  Location: [facility] ENDOSCOPY;  Service: Gastroenterology;  Laterality: N/A;   GIVENS CAPSULE STUDY N/A 10/30/2017   Procedure: GIVENS CAPSULE STUDY 12 HR;  Surgeon: Jonathon Bellows, MD;  Location: [facility] ENDOSCOPY;  Service: Gastroenterology;   Laterality: N/A;   IVC FILTER INSERTION N/A 10/12/2017   Procedure: IVC FILTER INSERTION;  Surgeon: Algernon Huxley, MD;  Location: [place] CV LAB;  Service: Cardiovascular;  Laterality: N/A;   LOWER EXTREMITY ANGIOGRAPHY Right 10/04/2018   Procedure: LOWER EXTREMITY ANGIOGRAPHY;  Surgeon: Algernon Huxley, MD;  Location: [place] CV LAB;  Service: Cardiovascular;  Laterality: Right;   LOWER EXTREMITY ANGIOGRAPHY Right 03/21/2021   Procedure: LOWER EXTREMITY ANGIOGRAPHY;  Surgeon: Algernon Huxley, MD;  Location: [place] CV LAB;  Service: Cardiovascular;  Laterality: Right;   LOWER EXTREMITY ANGIOGRAPHY Right 05/23/2021   Procedure: LOWER EXTREMITY ANGIOGRAPHY;  Surgeon: Algernon Huxley, MD;  Location: [place] CV LAB;  Service: Cardiovascular;  Laterality: Right;   PERIPHERAL VASCULAR CATHETERIZATION Left 01/18/2015   Procedure: Lower Extremity Angiography;  Surgeon: Algernon Huxley, MD;  Location: [place] CV LAB;  Service: Cardiovascular;  Laterality: Left;   PERIPHERAL VASCULAR CATHETERIZATION N/A 01/18/2015   Procedure: Lower Extremity Intervention;  Surgeon: Algernon Huxley, MD;  Location: [place] CV LAB;  Service: Cardiovascular;  Laterality: N/A;   PERIPHERAL VASCULAR CATHETERIZATION  07/30/2015   Procedure: Lower Extremity Intervention;  Surgeon: Algernon Huxley, MD;  Location: [place] CV LAB;  Service: Cardiovascular;;   PERIPHERAL VASCULAR CATHETERIZATION N/A 07/30/2015   Procedure: Abdominal Aortogram w/Lower Extremity;  Surgeon: Algernon Huxley, MD;  Location: [place] CV LAB;  Service: Cardiovascular;  Laterality: N/A;   PERIPHERAL VASCULAR CATHETERIZATION Left 08/22/2015   Procedure: Lower Extremity Angiography;  Surgeon: Algernon Huxley, MD;  Location: [place] CV LAB;  Service: Cardiovascular;  Laterality: Left;   PERIPHERAL VASCULAR CATHETERIZATION Left 08/22/2015   Procedure: Lower Extremity Intervention;  Surgeon: Algernon Huxley, MD;  Location: [place] CV LAB;  Service:  Cardiovascular;  Laterality: Left;   PERIPHERAL VASCULAR CATHETERIZATION Right 03/31/2016   Procedure: Lower Extremity Angiography;  Surgeon: Algernon Huxley, MD;  Location: [place] CV LAB;  Service: Cardiovascular;  Laterality: Right;   PERIPHERAL VASCULAR CATHETERIZATION  03/31/2016   Procedure: Lower Extremity Intervention;  Surgeon:  Algernon Huxley, MD;  Location: [place] CV LAB;  Service: Cardiovascular;;   PERIPHERAL VASCULAR CATHETERIZATION Left 04/10/2016   Procedure: Lower Extremity Angiography;  Surgeon: Algernon Huxley, MD;  Location: [place] CV LAB;  Service: Cardiovascular;  Laterality: Left;   VACUUM ASSISTED CLOSURE CHANGE Left 10/03/2019   Procedure: LEFT THIGH VACUUM ASSISTED CLOSURE CHANGE;  Surgeon: Algernon Huxley, MD;  Location: [facility];  Service: General;  Laterality: Left;   WOUND DEBRIDEMENT Left 10/18/2015   Procedure: DEBRIDEMENT WOUND   ( LEFT BKA DEBRIDEMENT );  Surgeon: Algernon Huxley, MD;  Location: [facility];  Service: Vascular;  Laterality: Left;     Social History   Tobacco Use   Smoking status: Former    Packs/day: 1.00    Years: 44.00    Pack years: 44.00    Types: Cigarettes    Quit date: 11/17/2012    Years since quitting: 8.6   Smokeless tobacco: Never  Vaping Use   Vaping Use: Never used  Substance Use Topics   Alcohol use: Not Currently   Drug use: Never       Family History  Problem Relation Age of Onset   Heart attack Mother    Varicose Veins Neg Hx      No Known Allergies   REVIEW OF SYSTEMS (Negative unless checked)  Constitutional: [] Weight loss  [] Fever  [] Chills Cardiac: [] Chest pain   [] Chest pressure   [] Palpitations   [] Shortness of breath when laying flat   [] Shortness of breath at rest   [] Shortness of breath with exertion. Vascular:  [] Pain in legs with walking   [] Pain in legs at rest   [] Pain in legs when laying flat   [] Claudication   [] Pain in feet when walking  [x] Pain in feet at rest  [x] Pain in feet when laying flat    [] History of DVT   [] Phlebitis   [x] Swelling in legs   [] Varicose veins   [x] Non-healing ulcers Pulmonary:   [] Uses home oxygen   [] Productive cough   [] Hemoptysis   [] Wheeze  [] COPD   [] Asthma Neurologic:  [] Dizziness  [] Blackouts   [] Seizures   [] History of stroke   [] History of TIA  [] Aphasia   [] Temporary blindness   [] Dysphagia   [] Weakness or numbness in arms   [] Weakness or numbness in legs Musculoskeletal:  [x] Arthritis   [] Joint swelling   [x] Joint pain   [] Low back pain Hematologic:  [] Easy bruising  [] Easy bleeding   [] Hypercoagulable state   [] Anemic   Gastrointestinal:  [] Blood in stool   [] Vomiting blood  [x] Gastroesophageal reflux/heartburn   [] Abdominal pain Genitourinary:  [] Chronic kidney disease   [] Difficult urination  [] Frequent urination  [] Burning with urination   [] Hematuria Skin:  [] Rashes   [x] Ulcers   [x] Wounds Psychological:  [] History of anxiety   []  History of major depression.  Physical Examination  BP 132/74   Pulse 91   Ht 6\' 1"  (1.854 m)   Wt 224 lb (101.6 kg)   BMI 29.55 kg/m  Gen:  WD/WN, NAD Head: [place]/AT, No temporalis wasting. Ear/Nose/Throat: Hearing grossly intact, nares w/o erythema or drainage Eyes: Conjunctiva clear. Sclera non-icteric Neck: Supple.  Trachea midline Pulmonary:  Good air movement, no use of accessory muscles.  Cardiac: Irregular Vascular:  Vessel Right Left  Radial Palpable Palpable                          PT Not palpable Not palpable  DP  Not palpable Not palpable   Gastrointestinal: soft, non-tender/non-distended. No guarding/reflex.  Musculoskeletal: M/S 5/5 throughout.  No deformity or atrophy.  Left AKA well-healed.  2+ right lower extremity edema.  Wounds are currently dressed Neurologic: Sensation grossly intact in extremities.  Symmetrical.  Speech is fluent.  Psychiatric: Judgment intact, Mood & affect appropriate for pt's clinical situation. Dermatologic: Right foot wound is currently  dressed      Labs Recent Results (from the past 2160 hour(s))  BUN     Status: None   Collection Time: 05/23/21  8:51 AM  Result Value Ref Range   BUN 19 8 - 23 mg/dL    Comment: Performed at [facility], [address] [place], [place] [address]  Creatinine, serum     Status: None   Collection Time: 05/23/21  8:51 AM  Result Value Ref Range   Creatinine, Ser 1.18 0.61 - 1.24 mg/dL   GFR, Estimated >60 >60 mL/min    Comment: (NOTE) Calculated using the CKD-EPI Creatinine Equation (2021) Performed at [facility], [address] [place], [place] [address]   Ferritin     Status: None   Collection Time: 06/14/21 11:39 AM  Result Value Ref Range   Ferritin 93 24 - 336 ng/mL    Comment: Performed at [facility], [address] [place], [place] [address]  Iron and TIBC     Status: Abnormal   Collection Time: 06/14/21 11:39 AM  Result Value Ref Range   Iron 37 (L) 45 - 182 ug/dL   TIBC 312 250 - 450 ug/dL   Saturation Ratios 12 (L) 17.9 - 39.5 %   UIBC 275 ug/dL    Comment: Performed at [facility], [address] [place], [place] [address]  Comprehensive metabolic panel     Status: Abnormal   Collection Time: 06/14/21 11:39 AM  Result Value Ref Range   Sodium 137 135 - 145 mmol/L   Potassium 4.1 3.5 - 5.1 mmol/L   Chloride 102 98 - 111 mmol/L   CO2 28 22 - 32 mmol/L   Glucose, Bld 116 (H) 70 - 99 mg/dL    Comment: Glucose reference range applies only to samples taken after fasting for at least 8 hours.   BUN 14 8 - 23 mg/dL   Creatinine, Ser 1.20 0.61 - 1.24 mg/dL   Calcium 8.7 (L) 8.9 - 10.3 mg/dL   Total Protein 7.9 6.5 - 8.1 g/dL   Albumin 3.9 3.5 - 5.0 g/dL   AST 15 15 - 41 U/L   ALT 11 0 - 44 U/L   Alkaline Phosphatase 116 38 - 126 U/L   Total Bilirubin 0.4 0.3 - 1.2 mg/dL   GFR, Estimated >60 >60 mL/min    Comment: (NOTE) Calculated using the CKD-EPI Creatinine Equation (2021)    Anion gap 7 5 - 15    Comment:  Performed at [facility], [address] [place], [place] [address]  CBC with Differential     Status: Abnormal   Collection Time: 06/14/21 11:39 AM  Result Value Ref Range   WBC 11.5 (H) 4.0 - 10.5 K/uL   RBC 5.37 4.22 - 5.81 MIL/uL   Hemoglobin 13.9 13.0 - 17.0 g/dL   HCT 43.9 39.0 - 52.0 %   MCV 81.8 80.0 - 100.0 fL   MCH 25.9 (L) 26.0 - 34.0 pg   MCHC 31.7 30.0 - 36.0 g/dL   RDW 15.6 (H) 11.5 - 15.5 %   Platelets  348 150 - 400 K/uL   nRBC 0.0 0.0 - 0.2 %   Neutrophils Relative % 74 %   Neutro Abs 8.6 (H) 1.7 - 7.7 K/uL   Lymphocytes Relative 16 %   Lymphs Abs 1.9 0.7 - 4.0 K/uL   Monocytes Relative 6 %   Monocytes Absolute 0.7 0.1 - 1.0 K/uL   Eosinophils Relative 2 %   Eosinophils Absolute 0.2 0.0 - 0.5 K/uL   Basophils Relative 1 %   Basophils Absolute 0.1 0.0 - 0.1 K/uL   Immature Granulocytes 1 %   Abs Immature Granulocytes 0.07 0.00 - 0.07 K/uL    Comment: Performed at [facility], [address] [place], [place] [address]    Radiology VAS [place] ABI WITH/WO TBI  Result Date: 06/18/2021  LOWER EXTREMITY DOPPLER STUDY Patient Name:  Timothy Juarez  Date of Exam:   06/18/2021 Medical Rec #: [id number]          Accession #:    [id number] Date of Birth: Mar 02, 1951          Patient Gender: M Patient Age:   69 years Exam Location:  [facility] Procedure:      VAS [place] ABI WITH/WO TBI Referring Phys: Corene Cornea Arnetta Odeh --------------------------------------------------------------------------------  Indications: Peripheral artery disease, and Lt AKA.  Vascular Interventions: Multiple interventions 2014-17. Rt dis SFA-PTA stent                         10/04/2018 PTA left EIA. PTA right EIA. PTA right                         popliteal artery. PTA right poximal to mid ATA. PTA of                         right PTA from tibioperoneal trunk to foot. PTA of right                         peroneal artery and tibioperoneal trunk.                          03/21/2021: Aortogram  and Selective Right Lower                         Extremity Angiogram. Mechanical Trhombectomy of the                         Right SFA, Popliteal Artery,Anterior Tibial Artery and                         Peroneal Artery with penumbra CAT 6 device. PTA of the                         Right Anterior Tibial Artery with 2.5 mm diameter                         angioplasty balloon. PTA of the Right Peronael Artery                         with 3 mm diameter angioplasty balloon. PTA of the  entire Right SFA and Popliteal Artery with multiple                         inflations with a 5 mm dismeter by 22 cm length Lutonix                         drug coated angioplasty balloon. Stent placementto the                         Right Proximal SFA with 6 mm diameter by 2.5 cm length                         Viabahn stent. Stent placement to the Right Popliteal                         Artery and Distal SFA with 6 mm diameter by 25 cm length                         Viabahn Stent. Comparison Study: 05/15/2021 Performing Technologist: Charlane Ferretti RT (R)(VS)  Examination Guidelines: A complete evaluation includes at minimum, Doppler waveform signals and systolic blood pressure reading at the level of bilateral brachial, anterior tibial, and posterior tibial arteries, when vessel segments are accessible. Bilateral testing is considered an integral part of a complete examination. Photoelectric Plethysmograph (PPG) waveforms and toe systolic pressure readings are included as required and additional duplex testing as needed. Limited examinations for reoccurring indications may be performed as noted.  ABI Findings: +---------+------------------+-----+----------+---------+ Right    Rt Pressure (mmHg)IndexWaveform  Comment   +---------+------------------+-----+----------+---------+ Brachial 154                                        +---------+------------------+-----+----------+---------+ ATA      53                      monophasic          +---------+------------------+-----+----------+---------+ PTA      60                0.38 monophasic          +---------+------------------+-----+----------+---------+ Great Toe                                 Amputated +---------+------------------+-----+----------+---------+ +--------+------------------+-----+--------+-------+ Left    Lt Pressure (mmHg)IndexWaveformComment +--------+------------------+-----+--------+-------+ [id number]                            AKA     +--------+------------------+-----+--------+-------+ +-------+-----------+-----------+------------+------------+ ABI/TBIToday's ABIToday's TBIPrevious ABIPrevious TBI +-------+-----------+-----------+------------+------------+ Right  .38                   .42         .69          +-------+-----------+-----------+------------+------------+ Right ABIs appear essentially unchanged compared to prior study on 05/15/2021.  Summary: Right: Resting right ankle-brachial index indicates severe right lower extremity arterial disease. *See table(s) above for measurements and observations.  Electronically signed by [name] MD on 06/18/2021 at 2:51:38 PM.    Final     Assessment/Plan  Essential hypertension  blood pressure control important in reducing the progression of atherosclerotic disease. On appropriate oral medications.   Hyperlipidemia lipid control important in reducing the progression of atherosclerotic disease. Continue statin therapy   Hx of AKA (above knee amputation), left [facility]) Now well healed  Atherosclerotic peripheral vascular disease with ulceration ([place]) Patient is now ready to proceed with right above-knee amputation.  I do not believe any revascularization is going to be durable as his runoff is extremely poor.  His pain is really unbearable at this point.  We will give him some pain medications get him on the schedule in the near future for right  above-knee amputation.    [name], MD  07/05/2021 10:27 AM    This note was created with Dragon medical transcription system.  Any errors from dictation are purely unintentional

## 2021-07-05 NOTE — Assessment & Plan Note (Signed)
Patient is now ready to proceed with right above-knee amputation.  I do not believe any revascularization is going to be durable as his runoff is extremely poor.  His pain is really unbearable at this point.  We will give him some pain medications get him on the schedule in the near future for right above-knee amputation.

## 2021-07-05 NOTE — Assessment & Plan Note (Signed)
Now well healed 

## 2021-07-08 ENCOUNTER — Telehealth (INDEPENDENT_AMBULATORY_CARE_PROVIDER_SITE_OTHER): Payer: Self-pay

## 2021-07-08 NOTE — Telephone Encounter (Signed)
Olivia Mackie called back and left a message to schedule the patient and fax instructions to attention Jodie at [facility]. Patient will be scheduled with Dr. Lucky Cowboy for 07/18/21 at the [facility]. Pre-op on 07/16/21 at 11:00 am for covid testing as well. Pre-surgical instructions will be faxed to attention Jodie at [facility].

## 2021-07-08 NOTE — Telephone Encounter (Signed)
I attempted to contact [facility] and had to leave a message for Olivia Mackie to return my call to schedule the patient for a right AKA with Dr. Lucky Cowboy.

## 2021-07-16 ENCOUNTER — Other Ambulatory Visit (INDEPENDENT_AMBULATORY_CARE_PROVIDER_SITE_OTHER): Payer: Self-pay | Admitting: Nurse Practitioner

## 2021-07-16 ENCOUNTER — Other Ambulatory Visit
Admission: RE | Admit: 2021-07-16 | Discharge: 2021-07-16 | Disposition: A | Discharge status: Home / Self Care | Payer: Medicare Other | Source: Ambulatory Visit | Attending: Vascular Surgery | Admitting: Vascular Surgery

## 2021-07-16 ENCOUNTER — Encounter
Admission: RE | Admit: 2021-07-16 | Discharge: 2021-07-16 | Disposition: A | Discharge status: Home / Self Care | Payer: Medicare Other | Source: Ambulatory Visit | Attending: Vascular Surgery | Admitting: Vascular Surgery

## 2021-07-16 ENCOUNTER — Other Ambulatory Visit: Payer: Self-pay

## 2021-07-16 DIAGNOSIS — Z87891 Personal history of nicotine dependence: Secondary | ICD-10-CM | POA: Diagnosis not present

## 2021-07-16 DIAGNOSIS — R710 Precipitous drop in hematocrit: Secondary | ICD-10-CM | POA: Diagnosis not present

## 2021-07-16 DIAGNOSIS — Z89612 Acquired absence of left leg above knee: Secondary | ICD-10-CM | POA: Diagnosis not present

## 2021-07-16 DIAGNOSIS — E785 Hyperlipidemia, unspecified: Secondary | ICD-10-CM | POA: Diagnosis not present

## 2021-07-16 DIAGNOSIS — Z7951 Long term (current) use of inhaled steroids: Secondary | ICD-10-CM | POA: Diagnosis not present

## 2021-07-16 DIAGNOSIS — Z20822 Contact with and (suspected) exposure to covid-19: Secondary | ICD-10-CM

## 2021-07-16 DIAGNOSIS — Z01812 Encounter for preprocedural laboratory examination: Secondary | ICD-10-CM | POA: Insufficient documentation

## 2021-07-16 DIAGNOSIS — Z7982 Long term (current) use of aspirin: Secondary | ICD-10-CM | POA: Diagnosis not present

## 2021-07-16 DIAGNOSIS — J449 Chronic obstructive pulmonary disease, unspecified: Secondary | ICD-10-CM | POA: Diagnosis not present

## 2021-07-16 DIAGNOSIS — Z01810 Encounter for preprocedural cardiovascular examination: Secondary | ICD-10-CM | POA: Diagnosis not present

## 2021-07-16 DIAGNOSIS — I7025 Atherosclerosis of native arteries of other extremities with ulceration: Secondary | ICD-10-CM | POA: Insufficient documentation

## 2021-07-16 DIAGNOSIS — I70235 Atherosclerosis of native arteries of right leg with ulceration of other part of foot: Secondary | ICD-10-CM | POA: Diagnosis not present

## 2021-07-16 DIAGNOSIS — K219 Gastro-esophageal reflux disease without esophagitis: Secondary | ICD-10-CM | POA: Diagnosis not present

## 2021-07-16 DIAGNOSIS — I70229 Atherosclerosis of native arteries of extremities with rest pain, unspecified extremity: Secondary | ICD-10-CM | POA: Insufficient documentation

## 2021-07-16 DIAGNOSIS — Z8249 Family history of ischemic heart disease and other diseases of the circulatory system: Secondary | ICD-10-CM | POA: Diagnosis not present

## 2021-07-16 DIAGNOSIS — I70221 Atherosclerosis of native arteries of extremities with rest pain, right leg: Secondary | ICD-10-CM

## 2021-07-16 DIAGNOSIS — Z79899 Other long term (current) drug therapy: Secondary | ICD-10-CM | POA: Diagnosis not present

## 2021-07-16 DIAGNOSIS — I10 Essential (primary) hypertension: Secondary | ICD-10-CM | POA: Diagnosis not present

## 2021-07-16 DIAGNOSIS — M79671 Pain in right foot: Secondary | ICD-10-CM | POA: Diagnosis present

## 2021-07-16 DIAGNOSIS — Z7901 Long term (current) use of anticoagulants: Secondary | ICD-10-CM | POA: Diagnosis not present

## 2021-07-16 LAB — BASIC METABOLIC PANEL
Anion gap: 6 (ref 5–15)
BUN: 12 mg/dL (ref 8–23)
CO2: 27 mmol/L (ref 22–32)
Calcium: 9 mg/dL (ref 8.9–10.3)
Chloride: 107 mmol/L (ref 98–111)
Creatinine, Ser: 1.01 mg/dL (ref 0.61–1.24)
GFR, Estimated: >60 mL/min (ref >60)
Glucose, Bld: 106 mg/dL — ABNORMAL HIGH (ref 70–99)
Potassium: 4.2 mmol/L (ref 3.5–5.1)
Sodium: 140 mmol/L (ref 135–145)

## 2021-07-16 LAB — TYPE AND SCREEN
ABO/RH(D): A POS
Antibody Screen: NEGATIVE

## 2021-07-16 LAB — CBC WITH DIFFERENTIAL/PLATELET
Abs Immature Granulocytes: 0.06 10*3/uL (ref 0.00–0.07)
Basophils Absolute: 0.1 10*3/uL (ref 0.0–0.1)
Basophils Relative: 0 %
Eosinophils Absolute: 0.3 10*3/uL (ref 0.0–0.5)
Eosinophils Relative: 2 %
HCT: 39.2 % (ref 39.0–52.0)
Hemoglobin: 12.4 g/dL — ABNORMAL LOW (ref 13.0–17.0)
Immature Granulocytes: 0 %
Lymphocytes Relative: 15 %
Lymphs Abs: 2.1 10*3/uL (ref 0.7–4.0)
MCH: 25.7 pg — ABNORMAL LOW (ref 26.0–34.0)
MCHC: 31.6 g/dL (ref 30.0–36.0)
MCV: 81.2 fL (ref 80.0–100.0)
Monocytes Absolute: 1 10*3/uL (ref 0.1–1.0)
Monocytes Relative: 7 %
Neutro Abs: 10.4 10*3/uL — ABNORMAL HIGH (ref 1.7–7.7)
Neutrophils Relative %: 76 %
Platelets: 406 10*3/uL — ABNORMAL HIGH (ref 150–400)
RBC: 4.83 MIL/uL (ref 4.22–5.81)
RDW: 15.7 % — ABNORMAL HIGH (ref 11.5–15.5)
WBC: 13.8 10*3/uL — ABNORMAL HIGH (ref 4.0–10.5)
nRBC: 0 % (ref 0.0–0.2)

## 2021-07-16 NOTE — Patient Instructions (Addendum)
Your procedure is scheduled on: 07/18/21 Report to [facility]. To find out your arrival time please call [phone] between 1PM - 3PM on 07/17/21.  Remember: Instructions that are not followed completely may result in serious medical risk, up to and including death, or upon the discretion of your surgeon and anesthesiologist your surgery may need to be rescheduled.     _X__ 1. Do not eat food or drink any liquids after midnight the night before your procedure.                 No gum chewing or hard candies.   __X__2.  On the morning of surgery brush your teeth with toothpaste and water, you                 may rinse your mouth with mouthwash if you wish.  Do not swallow any              toothpaste of mouthwash.     _X__ 3.  No Alcohol for 24 hours before or after surgery.   _X__ 4.  Do Not Smoke or use e-cigarettes For 24 Hours Prior to Your Surgery.                 Do not use any chewable tobacco products for at least 6 hours prior to                 surgery.  ____  5.  Bring all medications with you on the day of surgery if instructed.   __X__  6.  Notify your doctor if there is any change in your medical condition      (cold, fever, infections).     Do not wear jewelry, make-up, hairpins, clips or nail polish. Do not wear lotions, powders, or perfumes.  Do not shave body hair 48 hours prior to surgery. Men may shave face and neck. Do not bring valuables to the hospital.    [facility] is not responsible for any belongings or valuables.  Contacts, dentures/partials or body piercings may not be worn into surgery. Bring a case for your contacts, glasses or hearing aids, a denture cup will be supplied. Leave your suitcase in the car. After surgery it may be brought to your room. For patients admitted to the hospital, discharge time is determined by your treatment team.   Patients discharged the day of surgery will not be  allowed to drive home.   Please read over the following fact sheets that you were given:   SAGE wipes  __X__ Take these medicines the morning of surgery with A SIP OF WATER:    1. baclofen (LIORESAL) 10 MG tablet  2. pantoprazole (PROTONIX) 40 MG tablet  3. tamsulosin (FLOMAX) 0.4 MG CAPS capsuleX  4.  5.  6.  ____ Fleet Enema (as directed)   __X__ Use CHG Soap/SAGE wipes as directed  __X__ Use inhalers on the day of surgery  ____ Stop metformin/Janumet/Farxiga 2 days prior to surgery    ____ Take 1/2 of usual insulin dose the night before surgery. No insulin the morning          of surgery.   __X__ Stop Blood Thinners Eliquis HOLD ELIQUIS X 4 DOSES (tonight, tomorrow am, pm and morning of surgery = 4 doses) May continue daily aspirin but hold day of surgery  __X__ Stop Anti-inflammatories 7 days before surgery such as Advil, Ibuprofen, Motrin,  BC  or Goodies Powder, Naprosyn, Naproxen, Aleve   __X__ Stop all herbal supplements, fish oil or vitamin E until after surgery.    ____ Bring C-Pap to the hospital.

## 2021-07-17 LAB — SARS CORONAVIRUS 2 (TAT 6-24 HRS): SARS Coronavirus 2: NEGATIVE

## 2021-07-18 ENCOUNTER — Inpatient Hospital Stay
Admission: RE | Admit: 2021-07-18 | Discharge: 2021-07-22 | DRG: 240 | Disposition: A | Discharge status: Skilled Nursing Facility | Payer: Medicare Other | Source: Skilled Nursing Facility | Attending: Vascular Surgery | Admitting: Vascular Surgery

## 2021-07-18 ENCOUNTER — Encounter: Payer: Self-pay | Admitting: Vascular Surgery

## 2021-07-18 ENCOUNTER — Encounter
Admission: RE | Disposition: A | Discharge status: Skilled Nursing Facility | Payer: Self-pay | Source: Home / Self Care | Attending: Vascular Surgery

## 2021-07-18 ENCOUNTER — Other Ambulatory Visit: Payer: Self-pay

## 2021-07-18 ENCOUNTER — Inpatient Hospital Stay: Payer: Medicare Other | Admitting: Anesthesiology

## 2021-07-18 ENCOUNTER — Inpatient Hospital Stay: Payer: Medicare Other | Admitting: Urgent Care

## 2021-07-18 DIAGNOSIS — K219 Gastro-esophageal reflux disease without esophagitis: Secondary | ICD-10-CM | POA: Diagnosis present

## 2021-07-18 DIAGNOSIS — Z20822 Contact with and (suspected) exposure to covid-19: Secondary | ICD-10-CM | POA: Diagnosis present

## 2021-07-18 DIAGNOSIS — I70235 Atherosclerosis of native arteries of right leg with ulceration of other part of foot: Principal | ICD-10-CM | POA: Diagnosis present

## 2021-07-18 DIAGNOSIS — Z87891 Personal history of nicotine dependence: Secondary | ICD-10-CM | POA: Diagnosis not present

## 2021-07-18 DIAGNOSIS — Z79899 Other long term (current) drug therapy: Secondary | ICD-10-CM

## 2021-07-18 DIAGNOSIS — R710 Precipitous drop in hematocrit: Secondary | ICD-10-CM | POA: Diagnosis not present

## 2021-07-18 DIAGNOSIS — Z8249 Family history of ischemic heart disease and other diseases of the circulatory system: Secondary | ICD-10-CM | POA: Diagnosis not present

## 2021-07-18 DIAGNOSIS — E785 Hyperlipidemia, unspecified: Secondary | ICD-10-CM | POA: Diagnosis present

## 2021-07-18 DIAGNOSIS — M79671 Pain in right foot: Secondary | ICD-10-CM | POA: Diagnosis present

## 2021-07-18 DIAGNOSIS — Z7951 Long term (current) use of inhaled steroids: Secondary | ICD-10-CM

## 2021-07-18 DIAGNOSIS — Z89612 Acquired absence of left leg above knee: Secondary | ICD-10-CM

## 2021-07-18 DIAGNOSIS — I70229 Atherosclerosis of native arteries of extremities with rest pain, unspecified extremity: Secondary | ICD-10-CM | POA: Diagnosis present

## 2021-07-18 DIAGNOSIS — J449 Chronic obstructive pulmonary disease, unspecified: Secondary | ICD-10-CM | POA: Diagnosis present

## 2021-07-18 DIAGNOSIS — Z7901 Long term (current) use of anticoagulants: Secondary | ICD-10-CM | POA: Diagnosis not present

## 2021-07-18 DIAGNOSIS — I10 Essential (primary) hypertension: Secondary | ICD-10-CM | POA: Diagnosis present

## 2021-07-18 DIAGNOSIS — I70239 Atherosclerosis of native arteries of right leg with ulceration of unspecified site: Secondary | ICD-10-CM | POA: Diagnosis not present

## 2021-07-18 DIAGNOSIS — Z7982 Long term (current) use of aspirin: Secondary | ICD-10-CM | POA: Diagnosis not present

## 2021-07-18 HISTORY — PX: AMPUTATION: SHX166

## 2021-07-18 LAB — SURGICAL PCR SCREEN
MRSA, PCR: NEGATIVE
Staphylococcus aureus: NEGATIVE

## 2021-07-18 SURGERY — AMPUTATION, ABOVE KNEE
Anesthesia: General | Site: Knee | Laterality: Right

## 2021-07-18 MED ORDER — BRIMONIDINE TARTRATE-TIMOLOL 0.2-0.5 % OP SOLN
1.0000 [drp] | Freq: Two times a day (BID) | OPHTHALMIC | Status: DC
Start: 2021-07-18 — End: 2021-07-18
  Filled 2021-07-18: qty 5

## 2021-07-18 MED ORDER — FENTANYL CITRATE (PF) 100 MCG/2ML IJ SOLN
INTRAMUSCULAR | Status: AC
  Administered 2021-07-18: 25 ug via INTRAVENOUS
  Filled 2021-07-18: qty 2

## 2021-07-18 MED ORDER — FERROUS SULFATE 325 (65 FE) MG PO TABS
324.0000 mg | ORAL_TABLET | Freq: Two times a day (BID) | ORAL | Status: DC
  Administered 2021-07-19 – 2021-07-22 (×7): 324 mg via ORAL
  Filled 2021-07-18 (×7): qty 1

## 2021-07-18 MED ORDER — LIDOCAINE HCL (PF) 2 % IJ SOLN
INTRAMUSCULAR | Status: AC
  Filled 2021-07-18: qty 5

## 2021-07-18 MED ORDER — GUAIFENESIN-DM 100-10 MG/5ML PO SYRP: 15.0000 mL | ORAL_SOLUTION | ORAL | Status: DC | PRN

## 2021-07-18 MED ORDER — RISAQUAD PO CAPS
1.0000 | ORAL_CAPSULE | Freq: Two times a day (BID) | ORAL | Status: DC
  Administered 2021-07-18 – 2021-07-22 (×8): 1 via ORAL
  Filled 2021-07-18 (×8): qty 1

## 2021-07-18 MED ORDER — LACTATED RINGERS IV SOLN: INTRAVENOUS | Status: DC

## 2021-07-18 MED ORDER — FLUTICASONE-UMECLIDIN-VILANT 100-62.5-25 MCG/ACT IN AEPB: 1.0000 | INHALATION_SPRAY | Freq: Every morning | RESPIRATORY_TRACT | Status: DC

## 2021-07-18 MED ORDER — FERROUS SULFATE 325 (65 FE) MG PO TABS: 324.0000 mg | ORAL_TABLET | Freq: Two times a day (BID) | ORAL | Status: DC

## 2021-07-18 MED ORDER — FUROSEMIDE 40 MG PO TABS
40.0000 mg | ORAL_TABLET | Freq: Every day | ORAL | Status: DC
  Administered 2021-07-19 – 2021-07-22 (×4): 40 mg via ORAL
  Filled 2021-07-18 (×4): qty 1

## 2021-07-18 MED ORDER — HYDRALAZINE HCL 20 MG/ML IJ SOLN: 5.0000 mg | INTRAMUSCULAR | Status: DC | PRN

## 2021-07-18 MED ORDER — PROPOFOL 10 MG/ML IV BOLUS
INTRAVENOUS | Status: DC | PRN
  Administered 2021-07-18: 50 mg via INTRAVENOUS
  Administered 2021-07-18: 150 mg via INTRAVENOUS

## 2021-07-18 MED ORDER — MELATONIN 5 MG PO TABS
5.0000 mg | ORAL_TABLET | Freq: Every evening | ORAL | Status: DC | PRN
  Administered 2021-07-18 – 2021-07-21 (×2): 5 mg via ORAL
  Filled 2021-07-18 (×2): qty 1

## 2021-07-18 MED ORDER — HYDROMORPHONE HCL 1 MG/ML IJ SOLN
INTRAMUSCULAR | Status: DC | PRN
  Administered 2021-07-18 (×2): .5 mg via INTRAVENOUS

## 2021-07-18 MED ORDER — FENTANYL CITRATE (PF) 100 MCG/2ML IJ SOLN
25.0000 ug | INTRAMUSCULAR | Status: AC | PRN
  Administered 2021-07-18 (×6): 25 ug via INTRAVENOUS

## 2021-07-18 MED ORDER — LOPERAMIDE HCL 2 MG PO CAPS: 2.0000 mg | ORAL_CAPSULE | Freq: Three times a day (TID) | ORAL | Status: DC | PRN

## 2021-07-18 MED ORDER — ATORVASTATIN CALCIUM 10 MG PO TABS
10.0000 mg | ORAL_TABLET | Freq: Every day | ORAL | Status: DC
  Administered 2021-07-18 – 2021-07-21 (×4): 10 mg via ORAL
  Filled 2021-07-18 (×4): qty 1

## 2021-07-18 MED ORDER — SODIUM CHLORIDE 0.9 % IV SOLN: INTRAVENOUS | Status: DC

## 2021-07-18 MED ORDER — FENTANYL 25 MCG/HR TD PT72
1.0000 | MEDICATED_PATCH | TRANSDERMAL | Status: DC
  Administered 2021-07-18 – 2021-07-21 (×2): 1 via TRANSDERMAL
  Filled 2021-07-18 (×2): qty 1

## 2021-07-18 MED ORDER — GUAIFENESIN 100 MG/5ML PO LIQD
15.0000 mL | Freq: Three times a day (TID) | ORAL | Status: DC | PRN
  Filled 2021-07-18: qty 15

## 2021-07-18 MED ORDER — OXYMETAZOLINE HCL 0.05 % NA SOLN
2.0000 | Freq: Two times a day (BID) | NASAL | Status: AC | PRN
  Filled 2021-07-18: qty 15

## 2021-07-18 MED ORDER — OXYCODONE HCL 5 MG PO TABS
10.0000 mg | ORAL_TABLET | ORAL | Status: DC | PRN
  Administered 2021-07-18 – 2021-07-21 (×3): 10 mg via ORAL
  Filled 2021-07-18 (×3): qty 2

## 2021-07-18 MED ORDER — CHLORHEXIDINE GLUCONATE CLOTH 2 % EX PADS
6.0000 | MEDICATED_PAD | Freq: Once | CUTANEOUS | Status: AC
  Administered 2021-07-18: 6 via TOPICAL

## 2021-07-18 MED ORDER — TAMSULOSIN HCL 0.4 MG PO CAPS
0.4000 mg | ORAL_CAPSULE | Freq: Every day | ORAL | Status: DC
  Administered 2021-07-19 – 2021-07-22 (×4): 0.4 mg via ORAL
  Filled 2021-07-18 (×4): qty 1

## 2021-07-18 MED ORDER — HYDROMORPHONE HCL 1 MG/ML IJ SOLN
INTRAMUSCULAR | Status: AC
  Filled 2021-07-18: qty 1

## 2021-07-18 MED ORDER — LABETALOL HCL 5 MG/ML IV SOLN: 10.0000 mg | INTRAVENOUS | Status: DC | PRN

## 2021-07-18 MED ORDER — POLYETHYLENE GLYCOL 3350 17 G PO PACK: 17.0000 g | PACK | Freq: Every day | ORAL | Status: DC | PRN

## 2021-07-18 MED ORDER — HYDROMORPHONE HCL 1 MG/ML IJ SOLN
INTRAMUSCULAR | Status: AC
  Administered 2021-07-18: 1 mg via INTRAVENOUS
  Filled 2021-07-18: qty 1

## 2021-07-18 MED ORDER — ALUM & MAG HYDROXIDE-SIMETH 200-200-20 MG/5ML PO SUSP: 15.0000 mL | ORAL | Status: DC | PRN

## 2021-07-18 MED ORDER — DEXAMETHASONE SODIUM PHOSPHATE 10 MG/ML IJ SOLN
INTRAMUSCULAR | Status: DC | PRN
  Administered 2021-07-18: 5 mg via INTRAVENOUS

## 2021-07-18 MED ORDER — MAGNESIUM SULFATE 2 GM/50ML IV SOLN: 2.0000 g | Freq: Every day | INTRAVENOUS | Status: DC | PRN

## 2021-07-18 MED ORDER — DOCUSATE SODIUM 100 MG PO CAPS
100.0000 mg | ORAL_CAPSULE | Freq: Every day | ORAL | Status: DC
  Administered 2021-07-19 – 2021-07-22 (×4): 100 mg via ORAL
  Filled 2021-07-18 (×4): qty 1

## 2021-07-18 MED ORDER — CEFAZOLIN SODIUM-DEXTROSE 2-4 GM/100ML-% IV SOLN
INTRAVENOUS | Status: AC
  Filled 2021-07-18: qty 100

## 2021-07-18 MED ORDER — ACETAMINOPHEN 10 MG/ML IV SOLN
INTRAVENOUS | Status: DC | PRN
  Administered 2021-07-18: 1000 mg via INTRAVENOUS

## 2021-07-18 MED ORDER — HYDROMORPHONE HCL 1 MG/ML IJ SOLN: 1.0000 mg | Freq: Once | INTRAMUSCULAR | Status: AC | PRN

## 2021-07-18 MED ORDER — BACLOFEN 10 MG PO TABS
10.0000 mg | ORAL_TABLET | Freq: Three times a day (TID) | ORAL | Status: DC
  Administered 2021-07-18 – 2021-07-22 (×12): 10 mg via ORAL
  Filled 2021-07-18 (×14): qty 1

## 2021-07-18 MED ORDER — ASCORBIC ACID 500 MG PO TABS
500.0000 mg | ORAL_TABLET | Freq: Two times a day (BID) | ORAL | Status: DC
  Administered 2021-07-18 – 2021-07-22 (×8): 500 mg via ORAL
  Filled 2021-07-18 (×8): qty 1

## 2021-07-18 MED ORDER — ASPIRIN EC 81 MG PO TBEC
81.0000 mg | DELAYED_RELEASE_TABLET | Freq: Every day | ORAL | Status: DC
  Administered 2021-07-19 – 2021-07-22 (×4): 81 mg via ORAL
  Filled 2021-07-18 (×4): qty 1

## 2021-07-18 MED ORDER — ONDANSETRON HCL 4 MG/2ML IJ SOLN: 4.0000 mg | Freq: Four times a day (QID) | INTRAMUSCULAR | Status: DC | PRN

## 2021-07-18 MED ORDER — METOPROLOL TARTRATE 5 MG/5ML IV SOLN: 2.0000 mg | INTRAVENOUS | Status: DC | PRN

## 2021-07-18 MED ORDER — FENTANYL CITRATE (PF) 100 MCG/2ML IJ SOLN
INTRAMUSCULAR | Status: DC | PRN
  Administered 2021-07-18 (×3): 50 ug via INTRAVENOUS

## 2021-07-18 MED ORDER — DEXAMETHASONE SODIUM PHOSPHATE 10 MG/ML IJ SOLN
INTRAMUSCULAR | Status: AC
  Filled 2021-07-18: qty 1

## 2021-07-18 MED ORDER — APIXABAN 2.5 MG PO TABS
2.5000 mg | ORAL_TABLET | Freq: Two times a day (BID) | ORAL | Status: DC
  Administered 2021-07-19 – 2021-07-22 (×7): 2.5 mg via ORAL
  Filled 2021-07-18 (×7): qty 1

## 2021-07-18 MED ORDER — FENTANYL CITRATE (PF) 100 MCG/2ML IJ SOLN
INTRAMUSCULAR | Status: AC
  Filled 2021-07-18: qty 2

## 2021-07-18 MED ORDER — CHLORHEXIDINE GLUCONATE 0.12 % MT SOLN
OROMUCOSAL | Status: AC
  Filled 2021-07-18: qty 15

## 2021-07-18 MED ORDER — ONDANSETRON HCL 4 MG/2ML IJ SOLN
INTRAMUSCULAR | Status: DC | PRN
  Administered 2021-07-18: 4 mg via INTRAVENOUS

## 2021-07-18 MED ORDER — UMECLIDINIUM BROMIDE 62.5 MCG/ACT IN AEPB
1.0000 | INHALATION_SPRAY | Freq: Every day | RESPIRATORY_TRACT | Status: DC
  Administered 2021-07-19 – 2021-07-22 (×4): 1 via RESPIRATORY_TRACT
  Filled 2021-07-18: qty 7

## 2021-07-18 MED ORDER — FLUTICASONE FUROATE-VILANTEROL 100-25 MCG/ACT IN AEPB
1.0000 | INHALATION_SPRAY | Freq: Every day | RESPIRATORY_TRACT | Status: DC
  Administered 2021-07-19 – 2021-07-22 (×4): 1 via RESPIRATORY_TRACT
  Filled 2021-07-18: qty 28

## 2021-07-18 MED ORDER — OMEGA-3-ACID ETHYL ESTERS 1 G PO CAPS
1.0000 g | ORAL_CAPSULE | Freq: Every day | ORAL | Status: DC
  Administered 2021-07-19 – 2021-07-22 (×4): 1 g via ORAL
  Filled 2021-07-18 (×4): qty 1

## 2021-07-18 MED ORDER — GLYCOPYRROLATE 0.2 MG/ML IJ SOLN
INTRAMUSCULAR | Status: AC
  Filled 2021-07-18: qty 1

## 2021-07-18 MED ORDER — ACETAMINOPHEN 10 MG/ML IV SOLN
INTRAVENOUS | Status: AC
  Filled 2021-07-18: qty 100

## 2021-07-18 MED ORDER — DOCUSATE SODIUM 100 MG PO CAPS: 100.0000 mg | ORAL_CAPSULE | Freq: Every day | ORAL | Status: DC | PRN

## 2021-07-18 MED ORDER — ONDANSETRON HCL 4 MG/2ML IJ SOLN
INTRAMUSCULAR | Status: AC
  Filled 2021-07-18: qty 2

## 2021-07-18 MED ORDER — HYDROCODONE-ACETAMINOPHEN 5-325 MG PO TABS
1.0000 | ORAL_TABLET | Freq: Four times a day (QID) | ORAL | Status: DC | PRN
Start: 2021-07-18 — End: 2021-07-22
  Administered 2021-07-18 – 2021-07-19 (×2): 1 via ORAL
  Administered 2021-07-19 – 2021-07-22 (×6): 2 via ORAL
  Filled 2021-07-18 (×4): qty 2
  Filled 2021-07-18: qty 1
  Filled 2021-07-18 (×2): qty 2
  Filled 2021-07-18: qty 1
  Filled 2021-07-18 (×2): qty 2

## 2021-07-18 MED ORDER — DEXMEDETOMIDINE (PRECEDEX) IN NS 20 MCG/5ML (4 MCG/ML) IV SYRINGE
PREFILLED_SYRINGE | INTRAVENOUS | Status: DC | PRN
  Administered 2021-07-18: 4 ug via INTRAVENOUS
  Administered 2021-07-18 (×2): 8 ug via INTRAVENOUS

## 2021-07-18 MED ORDER — DOXYCYCLINE HYCLATE 100 MG PO TABS
100.0000 mg | ORAL_TABLET | Freq: Two times a day (BID) | ORAL | Status: DC
  Administered 2021-07-18: 100 mg via ORAL
  Filled 2021-07-18: qty 1

## 2021-07-18 MED ORDER — PHENYLEPHRINE HCL (PRESSORS) 10 MG/ML IV SOLN
INTRAVENOUS | Status: DC | PRN
  Administered 2021-07-18 (×2): 80 ug via INTRAVENOUS
  Administered 2021-07-18: 160 ug via INTRAVENOUS
  Administered 2021-07-18 (×2): 80 ug via INTRAVENOUS

## 2021-07-18 MED ORDER — CHLORHEXIDINE GLUCONATE 0.12 % MT SOLN
15.0000 mL | Freq: Once | OROMUCOSAL | Status: AC
  Administered 2021-07-18: 15 mL via OROMUCOSAL

## 2021-07-18 MED ORDER — LIDOCAINE HCL (CARDIAC) PF 100 MG/5ML IV SOSY
PREFILLED_SYRINGE | INTRAVENOUS | Status: DC | PRN
  Administered 2021-07-18: 80 mg via INTRAVENOUS

## 2021-07-18 MED ORDER — FAMOTIDINE IN NACL 20-0.9 MG/50ML-% IV SOLN
20.0000 mg | Freq: Two times a day (BID) | INTRAVENOUS | Status: DC
  Administered 2021-07-18 – 2021-07-19 (×2): 20 mg via INTRAVENOUS
  Filled 2021-07-18 (×3): qty 50

## 2021-07-18 MED ORDER — 0.9 % SODIUM CHLORIDE (POUR BTL) OPTIME
TOPICAL | Status: DC | PRN
  Administered 2021-07-18: 750 mL

## 2021-07-18 MED ORDER — AMMONIUM LACTATE 12 % EX LOTN
TOPICAL_LOTION | Freq: Two times a day (BID) | CUTANEOUS | Status: DC
  Filled 2021-07-18: qty 400

## 2021-07-18 MED ORDER — SALINE SPRAY 0.65 % NA SOLN
2.0000 | Freq: Two times a day (BID) | NASAL | Status: DC | PRN
  Filled 2021-07-18: qty 44

## 2021-07-18 MED ORDER — DOXYCYCLINE MONOHYDRATE 100 MG PO CAPS: 100.0000 mg | ORAL_CAPSULE | Freq: Two times a day (BID) | ORAL | Status: DC

## 2021-07-18 MED ORDER — NITROGLYCERIN 0.4 MG SL SUBL: 0.4000 mg | SUBLINGUAL_TABLET | SUBLINGUAL | Status: DC | PRN

## 2021-07-18 MED ORDER — POTASSIUM CHLORIDE CRYS ER 20 MEQ PO TBCR: 20.0000 meq | EXTENDED_RELEASE_TABLET | Freq: Every day | ORAL | Status: DC | PRN

## 2021-07-18 MED ORDER — TIMOLOL MALEATE 0.5 % OP SOLN
1.0000 [drp] | Freq: Two times a day (BID) | OPHTHALMIC | Status: DC
  Administered 2021-07-18 – 2021-07-22 (×8): 1 [drp] via OPHTHALMIC
  Filled 2021-07-18: qty 5

## 2021-07-18 MED ORDER — BRIMONIDINE TARTRATE 0.2 % OP SOLN
1.0000 [drp] | Freq: Two times a day (BID) | OPHTHALMIC | Status: DC
  Administered 2021-07-18 – 2021-07-22 (×8): 1 [drp] via OPHTHALMIC
  Filled 2021-07-18: qty 5

## 2021-07-18 MED ORDER — ACETAMINOPHEN 325 MG PO TABS: 650.0000 mg | ORAL_TABLET | ORAL | Status: DC | PRN

## 2021-07-18 MED ORDER — KETOROLAC TROMETHAMINE 0.5 % OP SOLN
1.0000 [drp] | Freq: Two times a day (BID) | OPHTHALMIC | Status: DC
  Administered 2021-07-18 – 2021-07-20 (×4): 1 [drp] via OPHTHALMIC
  Filled 2021-07-18: qty 3

## 2021-07-18 MED ORDER — PHENOL 1.4 % MT LIQD
1.0000 | OROMUCOSAL | Status: DC | PRN
  Filled 2021-07-18: qty 177

## 2021-07-18 MED ORDER — PHENYLEPHRINE HCL (PRESSORS) 10 MG/ML IV SOLN
INTRAVENOUS | Status: AC
  Filled 2021-07-18: qty 1

## 2021-07-18 MED ORDER — ORAL CARE MOUTH RINSE: 15.0000 mL | Freq: Once | OROMUCOSAL | Status: AC

## 2021-07-18 MED ORDER — ONDANSETRON HCL 4 MG/2ML IJ SOLN: 4.0000 mg | Freq: Once | INTRAMUSCULAR | Status: DC | PRN

## 2021-07-18 MED ORDER — SORBITOL 70 % SOLN
30.0000 mL | Freq: Every day | Status: DC | PRN
  Filled 2021-07-18: qty 30

## 2021-07-18 MED ORDER — GLYCOPYRROLATE 0.2 MG/ML IJ SOLN
INTRAMUSCULAR | Status: DC | PRN
  Administered 2021-07-18: .2 mg via INTRAVENOUS

## 2021-07-18 MED ORDER — POTASSIUM CHLORIDE CRYS ER 20 MEQ PO TBCR
20.0000 meq | EXTENDED_RELEASE_TABLET | Freq: Every day | ORAL | Status: DC
  Administered 2021-07-19 – 2021-07-22 (×4): 20 meq via ORAL
  Filled 2021-07-18 (×4): qty 1

## 2021-07-18 MED ORDER — CEFAZOLIN SODIUM-DEXTROSE 2-4 GM/100ML-% IV SOLN
2.0000 g | INTRAVENOUS | Status: AC
  Administered 2021-07-18: 2 g via INTRAVENOUS

## 2021-07-18 MED ORDER — GABAPENTIN 100 MG PO CAPS
100.0000 mg | ORAL_CAPSULE | Freq: Every day | ORAL | Status: DC
  Administered 2021-07-18 – 2021-07-21 (×4): 100 mg via ORAL
  Filled 2021-07-18 (×4): qty 1

## 2021-07-18 MED ORDER — PANTOPRAZOLE SODIUM 40 MG PO TBEC
40.0000 mg | DELAYED_RELEASE_TABLET | Freq: Two times a day (BID) | ORAL | Status: DC
  Administered 2021-07-18 – 2021-07-22 (×8): 40 mg via ORAL
  Filled 2021-07-18 (×8): qty 1

## 2021-07-18 MED ORDER — PROPOFOL 10 MG/ML IV BOLUS
INTRAVENOUS | Status: AC
  Filled 2021-07-18: qty 20

## 2021-07-18 MED ORDER — CIPROFLOXACIN HCL 500 MG PO TABS
500.0000 mg | ORAL_TABLET | Freq: Two times a day (BID) | ORAL | Status: DC
  Administered 2021-07-18 – 2021-07-19 (×2): 500 mg via ORAL
  Filled 2021-07-18: qty 1

## 2021-07-18 SURGICAL SUPPLY — 41 items
APL PRP STRL LF DISP 70% ISPRP (MISCELLANEOUS) ×1
BLADE SAGITTAL WIDE XTHICK NO (BLADE) ×2 IMPLANT
BNDG CMPR STD VLCR NS LF 5.8X6 (GAUZE/BANDAGES/DRESSINGS) ×1
BNDG COHESIVE 4X5 TAN ST LF (GAUZE/BANDAGES/DRESSINGS) ×2 IMPLANT
BNDG ELASTIC 6X5.8 VLCR NS LF (GAUZE/BANDAGES/DRESSINGS) ×2 IMPLANT
BNDG GAUZE ELAST 4 BULKY (GAUZE/BANDAGES/DRESSINGS) ×4 IMPLANT
BRUSH SCRUB EZ  4% CHG (MISCELLANEOUS) ×2
BRUSH SCRUB EZ 4% CHG (MISCELLANEOUS) ×1 IMPLANT
CHLORAPREP W/TINT 26 (MISCELLANEOUS) ×2 IMPLANT
DRAPE INCISE IOBAN 66X45 STRL (DRAPES) ×2 IMPLANT
DRAPE INCISE IOBAN 66X60 STRL (DRAPES) ×2 IMPLANT
ELECT CAUTERY BLADE 6.4 (BLADE) ×2 IMPLANT
ELECT REM PT RETURN 9FT ADLT (ELECTROSURGICAL) ×2
ELECTRODE REM PT RTRN 9FT ADLT (ELECTROSURGICAL) ×1 IMPLANT
GAUZE 4X4 16PLY ~~LOC~~+RFID DBL (SPONGE) ×2 IMPLANT
GAUZE XEROFORM 1X8 LF (GAUZE/BANDAGES/DRESSINGS) ×4 IMPLANT
GLOVE SURG ENC MOIS LTX SZ7 (GLOVE) ×2 IMPLANT
GLOVE SURG SYN 7.0 (GLOVE) ×2 IMPLANT
GLOVE SURG SYN 7.0 PF PI (GLOVE) ×1 IMPLANT
GLOVE SURG UNDER LTX SZ7.5 (GLOVE) ×2 IMPLANT
GOWN STRL REUS W/ TWL LRG LVL3 (GOWN DISPOSABLE) ×1 IMPLANT
GOWN STRL REUS W/ TWL XL LVL3 (GOWN DISPOSABLE) ×2 IMPLANT
GOWN STRL REUS W/TWL LRG LVL3 (GOWN DISPOSABLE) ×2
GOWN STRL REUS W/TWL XL LVL3 (GOWN DISPOSABLE) ×4
HANDLE YANKAUER SUCT BULB TIP (MISCELLANEOUS) ×2 IMPLANT
KIT TURNOVER KIT A (KITS) ×2 IMPLANT
LABEL OR SOLS (LABEL) ×2 IMPLANT
MANIFOLD NEPTUNE II (INSTRUMENTS) ×2 IMPLANT
NS IRRIG 1000ML POUR BTL (IV SOLUTION) ×2 IMPLANT
PACK EXTREMITY ARMC (MISCELLANEOUS) ×2 IMPLANT
PAD ABD DERMACEA PRESS 5X9 (GAUZE/BANDAGES/DRESSINGS) ×4 IMPLANT
PAD PREP 24X41 OB/GYN DISP (PERSONAL CARE ITEMS) ×2 IMPLANT
SPONGE T-LAP 18X18 ~~LOC~~+RFID (SPONGE) ×4 IMPLANT
STAPLER SKIN PROX 35W (STAPLE) ×2 IMPLANT
STOCKINETTE M/LG 89821 (MISCELLANEOUS) ×2 IMPLANT
SUT SILK 2 0 (SUTURE) ×2
SUT SILK 2 0 SH (SUTURE) ×3 IMPLANT
SUT SILK 2-0 18XBRD TIE 12 (SUTURE) ×1 IMPLANT
SUT VIC AB 0 CT1 36 (SUTURE) ×7 IMPLANT
SUT VIC AB 2-0 CT1 (SUTURE) ×2 IMPLANT
WATER STERILE IRR 500ML POUR (IV SOLUTION) ×2 IMPLANT

## 2021-07-18 NOTE — Anesthesia Procedure Notes (Signed)
Procedure Name: LMA Insertion Date/Time: 07/18/2021 1:10 PM Performed by: Jerrye Noble, CRNA Pre-anesthesia Checklist: Patient identified, Emergency Drugs available, Suction available and Patient being monitored Patient Re-evaluated:Patient Re-evaluated prior to induction Oxygen Delivery Method: Circle system utilized Preoxygenation: Pre-oxygenation with 100% oxygen Induction Type: IV induction Ventilation: Mask ventilation without difficulty LMA: LMA inserted LMA Size: 4.5 Placement Confirmation: positive ETCO2 and breath sounds checked- equal and bilateral Tube secured with: Tape Dental Injury: Teeth and Oropharynx as per pre-operative assessment

## 2021-07-18 NOTE — Transfer of Care (Signed)
Immediate Anesthesia Transfer of Care Note  Patient: Timothy Juarez  Procedure(s) Performed: AMPUTATION ABOVE KNEE (Right: Knee)  Patient Location: PACU  Anesthesia Type:General  Level of Consciousness: alert , drowsy and patient cooperative  Airway & Oxygen Therapy: Patient Spontanous Breathing and Patient connected to face mask oxygen  Post-op Assessment: Report given to RN and Post -op Vital signs reviewed and stable  Post vital signs: Reviewed and stable  Last Vitals:  Vitals Value Taken Time  BP 137/75 07/18/21 1424  Temp    Pulse 106 07/18/21 1427  Resp    SpO2 100 % 07/18/21 1427  Vitals shown include unvalidated device data.  Last Pain:  Vitals:   07/18/21 1123  TempSrc: Oral  PainSc: 7          Complications: No notable events documented.

## 2021-07-18 NOTE — Interval H&P Note (Signed)
History and Physical Interval Note:  07/18/2021 12:24 PM  Timothy Juarez  has presented today for surgery, with the diagnosis of PAD WITH ULCER.  The various methods of treatment have been discussed with the patient and family. After consideration of risks, benefits and other options for treatment, the patient has consented to  Procedure(s): AMPUTATION ABOVE KNEE (Right) as a surgical intervention.  The patient's history has been reviewed, patient examined, no change in status, stable for surgery.  I have reviewed the patient's chart and labs.  Questions were answered to the patient's satisfaction.     [name]

## 2021-07-18 NOTE — Op Note (Signed)
  [name]   OPERATIVE NOTE   PROCEDURE:  Right above-the-knee amputation  PRE-OPERATIVE DIAGNOSIS: Right foot ulceration, rest pain, PAD  POST-OPERATIVE DIAGNOSIS: same as above  SURGEON:  [name], MD  ASSISTANT(S): Hezzie Bump, PA-C  ANESTHESIA: general  ESTIMATED BLOOD LOSS: 350 cc  FINDING(S): none  SPECIMEN(S):  Right above-the-knee amputation  INDICATIONS:   Timothy Juarez is a 70 y.o. male who presents with right foot ulceration and rest pain and non-reconstructable vascular disease.  The patient is scheduled for a right above-the-knee amputation.  I discussed in depth with the patient the risks, benefits, and alternatives to this procedure.  The patient is aware that the risk of this operation included but are not limited to:  bleeding, infection, myocardial infarction, stroke, death, failure to heal amputation wound, and possible need for more proximal amputation.  The patient is aware of the risks and agrees proceed forward with the procedure.  An assistant was present during the procedure to help facilitate the exposure and expedite the procedure.   DESCRIPTION: After full informed written consent was obtained from the patient, the patient was taken to the operating room, and placed supine upon the operating table.  Prior to induction, the patient received IV antibiotics.  The patient was then prepped and draped in the standard fashion for a right above-the-knee amputation.  After obtaining adequate anesthesia, the patient was prepped and draped in the standard fashion for a above-the-knee amputation. The assistant provided retraction and mobilization to help facilitate exposure and expedite the procedure throughout the entire procedure.  This included following suture, using retractors, and optimizing lighting.  I marked out the anterior and posterior flaps for a fish-mouth type of amputation.  I made the incisions for these flaps, and then  dissected through the subcutaneous tissue, fascia, and muscles circumferentially.  I elevated  the periosteal tissue 4-5 cm more proximal than the anterior skin flap.  I then transected the femur with a power saw at this level.  Then I smoothed out the rough edges of the bone.  At this point, the specimen was passed off the field as the above-the-knee amputation.  At this point, I clamped all visibly bleeding arteries and veins using a combination of suture ligation with silk suture and electrocautery.   Bleeding continued to be controlled with electrocautery and suture ligature.  The stump was washed off with sterile normal saline and no further active bleeding was noted.  I reapproximated the anterior and posterior fascia  with interrupted stitches of 0 Vicryl.  This was completed along the entire length of anterior and posterior fascia until there were no more loose space in the fascial line. The subcutaneous tissue was then approximated with 2-0 vicryl sutures. The skin was then  reapproximated with staples.  The stump was washed off and dried.  The incision was dressed with Xeroform and ABD pads, and  then fluffs were applied.  Kerlix was wrapped around the leg and then gently an ACE wrap was applied.  A large Ioban was then placed over the ACE wrap to secure the dressing. The patient was then awakened and take to the recovery room in stable condition.   COMPLICATIONS: none  CONDITION: stable  [name]  07/18/2021, 2:22 PM   This note was created with Dragon Medical transcription system. Any errors in dictation are purely unintentional.

## 2021-07-18 NOTE — Anesthesia Preprocedure Evaluation (Signed)
Anesthesia Evaluation  Patient identified by MRN, date of birth, ID band Patient awake    Airway Mallampati: II  TM Distance: >3 FB     Dental  (+) Missing,    Pulmonary Patient abstained from smoking., former smoker,     + decreased breath sounds      Cardiovascular Exercise Tolerance: Poor hypertension, Pt. on medications + Peripheral Vascular Disease, +CHF, + Orthopnea and + DOE   Rhythm:Regular     Neuro/Psych negative neurological ROS  negative psych ROS   GI/Hepatic Neg liver ROS, PUD, GERD  ,  Endo/Other  negative endocrine ROS  Renal/GU CRFRenal disease  negative genitourinary   Musculoskeletal negative musculoskeletal ROS (+)   Abdominal Normal abdominal exam  (+)   Peds negative pediatric ROS (+)  Hematology  (+) anemia ,   Anesthesia Other Findings   Reproductive/Obstetrics negative OB ROS                             Anesthesia Physical Anesthesia Plan  ASA: 4  Anesthesia Plan: General   Post-op Pain Management:    Induction: Intravenous  PONV Risk Score and Plan:   Airway Management Planned: Oral ETT  Additional Equipment:   Intra-op Plan:   Post-operative Plan: Extubation in OR  Informed Consent: I have reviewed the patients History and Physical, chart, labs and discussed the procedure including the risks, benefits and alternatives for the proposed anesthesia with the patient or authorized representative who has indicated his/her understanding and acceptance.       Plan Discussed with: CRNA and Surgeon  Anesthesia Plan Comments:         Anesthesia Quick Evaluation

## 2021-07-19 ENCOUNTER — Encounter: Payer: Self-pay | Admitting: Vascular Surgery

## 2021-07-19 DIAGNOSIS — M79671 Pain in right foot: Secondary | ICD-10-CM | POA: Diagnosis not present

## 2021-07-19 DIAGNOSIS — I70229 Atherosclerosis of native arteries of extremities with rest pain, unspecified extremity: Secondary | ICD-10-CM | POA: Diagnosis not present

## 2021-07-19 DIAGNOSIS — I70235 Atherosclerosis of native arteries of right leg with ulceration of other part of foot: Secondary | ICD-10-CM | POA: Diagnosis not present

## 2021-07-19 LAB — BASIC METABOLIC PANEL
Anion gap: 5 (ref 5–15)
BUN: 13 mg/dL (ref 8–23)
CO2: 23 mmol/L (ref 22–32)
Calcium: 8.2 mg/dL — ABNORMAL LOW (ref 8.9–10.3)
Chloride: 107 mmol/L (ref 98–111)
Creatinine, Ser: 0.82 mg/dL (ref 0.61–1.24)
GFR, Estimated: >60 mL/min (ref >60)
Glucose, Bld: 127 mg/dL — ABNORMAL HIGH (ref 70–99)
Potassium: 4.2 mmol/L (ref 3.5–5.1)
Sodium: 135 mmol/L (ref 135–145)

## 2021-07-19 LAB — CBC
HCT: 29.5 % — ABNORMAL LOW (ref 39.0–52.0)
Hemoglobin: 9.4 g/dL — ABNORMAL LOW (ref 13.0–17.0)
MCH: 25.5 pg — ABNORMAL LOW (ref 26.0–34.0)
MCHC: 31.9 g/dL (ref 30.0–36.0)
MCV: 80.2 fL (ref 80.0–100.0)
Platelets: 340 10*3/uL (ref 150–400)
RBC: 3.68 MIL/uL — ABNORMAL LOW (ref 4.22–5.81)
RDW: 15.3 % (ref 11.5–15.5)
WBC: 19.2 10*3/uL — ABNORMAL HIGH (ref 4.0–10.5)
nRBC: 0 % (ref 0.0–0.2)

## 2021-07-19 MED ORDER — FAMOTIDINE 20 MG PO TABS
20.0000 mg | ORAL_TABLET | Freq: Two times a day (BID) | ORAL | Status: DC
  Administered 2021-07-19 – 2021-07-22 (×6): 20 mg via ORAL
  Filled 2021-07-19 (×6): qty 1

## 2021-07-19 NOTE — NC FL2 (Signed)
[place] LEVEL OF CARE SCREENING TOOL     IDENTIFICATION  Patient Name: Timothy Juarez Birthdate: 1951/01/06 Sex: male Admission Date (Current Location): 07/18/2021  [facility] and [place] Number:  Engineering geologist and Address:  [facility], [address] [place], [place] [address]      Provider Number: [id number]  Attending Physician Name and Address:  Algernon Huxley, MD  Relative Name and Phone Number:  Keiji, Melland (Daughter)   [phone] [facility] Phone)    Current Level of Care: Hospital Recommended Level of Care: [place] Prior Approval Number:    Date Approved/Denied:   PASRR Number:    Discharge Plan: Home    Current Diagnoses: Patient Active Problem List   Diagnosis Date Noted   Atherosclerosis of artery of extremity with rest pain ([place]) 07/18/2021   Ischemic leg 03/21/2021   Esophagitis    Esophageal ulcer with bleeding 04/28/2020   Chronic diastolic CHF (congestive heart failure) ([place]) 04/27/2020   Leukocytosis 04/27/2020   Alcohol use 04/27/2020   Pulmonary disease    Hypotension 09/29/2019   History of COVID-19 09/27/2019   Abscess of left AKA stump 09/27/2019   (HFpEF) heart failure with preserved ejection fraction ([place]) 09/27/2019   COPD with chronic bronchitis ([place]) 09/27/2019   Chronic anticoagulation with eliquis 09/27/2019   Acute on chronic respiratory failure with hypoxia ([place]) 12/27/2018   Community acquired bilateral lower lobe pneumonia 12/27/2018   COVID-19 virus infection 12/27/2018   Acute respiratory failure with hypoxia ([place]) 12/27/2018   Hx of AKA (above knee amputation), left ([place]) 11/02/2018   Symptomatic anemia 10/10/2017   Chronic deep vein thrombosis (DVT) of left lower extremity (HCC)    Iron deficiency anemia 04/21/2017   Essential hypertension 03/13/2017   Recurrent deep vein thrombosis (DVT) ([place]) 03/11/2017   Hyperlipidemia 11/04/2016   Pressure ulcer  04/11/2016   Pseudoaneurysm of left femoral artery ([place]) 04/11/2016   Atherosclerosis of native arteries of extremity with rest pain ([place]) 04/10/2016   Atherosclerosis of native arteries of extremities with gangrene, left leg ([place]) 11/01/2015   Ischemia of lower extremity 09/14/2015   Atherosclerotic peripheral vascular disease with ulceration ([place]) 01/18/2015    Orientation RESPIRATION BLADDER Height & Weight     Self, Time, Place  Normal Continent Weight: 224 lb (101.6 kg) Height:  6\' 1"  (185.4 cm)  BEHAVIORAL SYMPTOMS/MOOD NEUROLOGICAL BOWEL NUTRITION STATUS        Diet (heart healthy/carb modified, thin liquids)  AMBULATORY STATUS COMMUNICATION OF NEEDS Skin   Extensive Assist Verbally Surgical wounds (r leg)                       Personal Care Assistance Level of Assistance  Bathing, Dressing, Feeding Bathing Assistance: Maximum assistance Feeding assistance: Limited assistance Dressing Assistance: Maximum assistance     Functional Limitations Info             SPECIAL CARE FACTORS FREQUENCY  PT (By licensed PT), OT (By licensed OT)     PT Frequency: 5 times per week OT Frequency: 5 times per week            Contractures      Additional Factors Info  Code Status, Allergies Code Status Info: full Allergies Info: nka           Current Medications (07/19/2021):  This is the current hospital active medication list Current Facility-Administered Medications  Medication Dose Route Frequency Provider Last Rate Last  Admin   0.9 %  sodium chloride infusion   Intravenous Continuous Algernon Huxley, MD 75 mL/hr at 07/19/21 0926 New Bag at 07/19/21 0926   acetaminophen (TYLENOL) tablet 650 mg  650 mg Oral Q4H PRN Algernon Huxley, MD       acidophilus (RISAQUAD) capsule 1 capsule  1 capsule Oral BID Algernon Huxley, MD   1 capsule at 07/19/21 0915   alum & mag hydroxide-simeth (MAALOX/MYLANTA) 200-200-20 MG/5ML suspension 15-30 mL  15-30 mL Oral Q2H PRN Algernon Huxley, MD        ammonium lactate (LAC-HYDRIN) 12 % lotion   Topical BID Algernon Huxley, MD       apixaban Arne Cleveland) tablet 2.5 mg  2.5 mg Oral BID Algernon Huxley, MD   2.5 mg at 07/19/21 7322   ascorbic acid (VITAMIN C) tablet 500 mg  500 mg Oral BID Algernon Huxley, MD   500 mg at 07/19/21 0915   aspirin EC tablet 81 mg  81 mg Oral Daily Algernon Huxley, MD   81 mg at 07/19/21 0916   atorvastatin (LIPITOR) tablet 10 mg  10 mg Oral QHS Algernon Huxley, MD   10 mg at 07/18/21 2221   baclofen (LIORESAL) tablet 10 mg  10 mg Oral TID Algernon Huxley, MD   10 mg at 07/19/21 0916   brimonidine (ALPHAGAN) 0.2 % ophthalmic solution 1 drop  1 drop Right Eye BID Algernon Huxley, MD   1 drop at 07/19/21 0254   And   timolol (TIMOPTIC) 0.5 % ophthalmic solution 1 drop  1 drop Right Eye BID Algernon Huxley, MD   1 drop at 07/19/21 2706   docusate sodium (COLACE) capsule 100 mg  100 mg Oral Daily PRN Algernon Huxley, MD       docusate sodium (COLACE) capsule 100 mg  100 mg Oral Daily Algernon Huxley, MD   100 mg at 07/19/21 0916   famotidine (PEPCID) tablet 20 mg  20 mg Oral BID Benita Gutter, RPH       fentaNYL (DURAGESIC) 25 MCG/HR 1 patch  1 patch Transdermal Q72H Algernon Huxley, MD   1 patch at 07/18/21 2376   ferrous sulfate tablet 324 mg  324 mg Oral BID Algernon Huxley, MD   324 mg at 07/19/21 0916   umeclidinium bromide (INCRUSE ELLIPTA) 62.5 MCG/ACT 1 puff  1 puff Inhalation Daily Algernon Huxley, MD   1 puff at 07/19/21 0918   And   fluticasone furoate-vilanterol (BREO ELLIPTA) 100-25 MCG/ACT 1 puff  1 puff Inhalation Daily Algernon Huxley, MD   1 puff at 07/19/21 0918   furosemide (LASIX) tablet 40 mg  40 mg Oral Daily Algernon Huxley, MD   40 mg at 07/19/21 2831   gabapentin (NEURONTIN) capsule 100 mg  100 mg Oral QHS Algernon Huxley, MD   100 mg at 07/18/21 2224   guaiFENesin (ROBITUSSIN) 100 MG/5ML liquid 15 mL  15 mL Oral TID PRN Algernon Huxley, MD       guaiFENesin-dextromethorphan (ROBITUSSIN DM) 100-10 MG/5ML syrup 15 mL  15 mL Oral Q4H PRN Algernon Huxley, MD       hydrALAZINE (APRESOLINE) injection 5 mg  5 mg Intravenous Q20 Min PRN Dew, Erskine Squibb, MD       HYDROcodone-acetaminophen (NORCO/VICODIN) 5-325 MG per tablet 1-2 tablet  1-2 tablet Oral Q6H PRN Algernon Huxley, MD   1 tablet  at 07/18/21 2222   ketorolac (ACULAR) 0.5 % ophthalmic solution 1 drop  1 drop Left Eye BID Algernon Huxley, MD   1 drop at 07/19/21 8832   labetalol (NORMODYNE) injection 10 mg  10 mg Intravenous Q10 min PRN Algernon Huxley, MD       loperamide (IMODIUM) capsule 2 mg  2 mg Oral TID PRN Algernon Huxley, MD       magnesium sulfate IVPB 2 g 50 mL  2 g Intravenous Daily PRN Lucky Cowboy, Erskine Squibb, MD       melatonin tablet 5 mg  5 mg Oral QHS PRN Algernon Huxley, MD   5 mg at 07/18/21 2222   metoprolol tartrate (LOPRESSOR) injection 2-5 mg  2-5 mg Intravenous Q2H PRN Algernon Huxley, MD       nitroGLYCERIN (NITROSTAT) SL tablet 0.4 mg  0.4 mg Sublingual Q5 min PRN Algernon Huxley, MD       omega-3 acid ethyl esters (LOVAZA) capsule 1 g  1 g Oral Daily Algernon Huxley, MD   1 g at 07/19/21 0915   ondansetron (ZOFRAN) injection 4 mg  4 mg Intravenous Q6H PRN Algernon Huxley, MD       ondansetron [facility]) injection 4 mg  4 mg Intravenous Q6H PRN Kris Hartmann, NP       oxyCODONE (Oxy IR/ROXICODONE) immediate release tablet 10 mg  10 mg Oral Q4H PRN Algernon Huxley, MD   10 mg at 07/19/21 0916   oxymetazoline (AFRIN) 0.05 % nasal spray 2 spray  2 spray Each Nare BID PRN Algernon Huxley, MD       pantoprazole (PROTONIX) EC tablet 40 mg  40 mg Oral BID Algernon Huxley, MD   40 mg at 07/19/21 0916   phenol (CHLORASEPTIC) mouth spray 1 spray  1 spray Mouth/Throat PRN Algernon Huxley, MD       polyethylene glycol (MIRALAX / GLYCOLAX) packet 17 g  17 g Oral Daily PRN Algernon Huxley, MD       potassium chloride SA (KLOR-CON M) CR tablet 20 mEq  20 mEq Oral Daily Algernon Huxley, MD   20 mEq at 07/19/21 0915   potassium chloride SA (KLOR-CON M) CR tablet 20-40 mEq  20-40 mEq Oral Daily PRN Algernon Huxley, MD       sodium  chloride (OCEAN) 0.65 % nasal spray 2 spray  2 spray Each Nare BID PRN Algernon Huxley, MD       sorbitol 70 % solution 30 mL  30 mL Oral Daily PRN Algernon Huxley, MD       tamsulosin (FLOMAX) capsule 0.4 mg  0.4 mg Oral Daily Algernon Huxley, MD   0.4 mg at 07/19/21 5498     Discharge Medications: Please see discharge summary for a list of discharge medications.  Relevant Imaging Results:  Relevant Lab Results:   Additional Information SS #: [id number]  [place], LCSW

## 2021-07-19 NOTE — Progress Notes (Signed)
[facility] Daily Progress Note  07/18/21: Right above-the-knee amputation  Subjective: Patient without complaint this AM.  No acute issues overnight.  Objective: Vitals:   07/19/21 0132 07/19/21 0305 07/19/21 0745 07/19/21 0746  BP: (!) 129/56 (!) 119/58 (!) 123/49   Pulse: 66 62 (!) 41 65  Resp: 18 20 18    Temp: 98.4 F (36.9 C) 99.2 F (37.3 C) 98.6 F (37 C)   TempSrc: Oral Oral    SpO2: 91% 92% 90% 93%  Weight:      Height:        Intake/Output Summary (Last 24 hours) at 07/19/2021 1327 Last data filed at 07/19/2021 0900 Gross per 24 hour  Intake 1192.75 ml  Output 925 ml  Net 267.75 ml   Physical Exam: A&Ox3, NAD CV: RRR Pulmonary: CTA Bilaterally Abdomen: Soft, Nontender, Nondistended Vascular:  Right lower extremity: Thigh soft.  Operating room dressing clean dry and intact.   Laboratory: CBC    Component Value Date/Time   WBC 19.2 (H) 07/19/2021 0302   HGB 9.4 (L) 07/19/2021 0302   HGB 11.2 (L) 05/07/2020 1409   HCT 29.5 (L) 07/19/2021 0302   HCT 36.6 (L) 05/07/2020 1409   PLT 340 07/19/2021 0302   PLT 342 05/07/2020 1409   BMET    Component Value Date/Time   NA 135 07/19/2021 0302   NA 136 12/01/2013 1317   K 4.2 07/19/2021 0302   K 3.9 12/01/2013 1317   CL 107 07/19/2021 0302   CL 100 12/01/2013 1317   CO2 23 07/19/2021 0302   CO2 33 (H) 12/01/2013 1317   GLUCOSE 127 (H) 07/19/2021 0302   GLUCOSE 91 12/01/2013 1317   BUN 13 07/19/2021 0302   BUN 24 (H) 09/25/2014 0846   CREATININE 0.82 07/19/2021 0302   CREATININE 1.29 09/25/2014 0846   CALCIUM 8.2 (L) 07/19/2021 0302   CALCIUM 9.4 12/01/2013 1317   GFRNONAA >60 07/19/2021 0302   GFRNONAA 60 (L) 09/25/2014 0846   GFRNONAA >60 03/16/2014 0750   GFRAA >60 04/30/2020 0650   GFRAA >60 09/25/2014 0846   GFRAA >60 03/16/2014 0750   Assessment/Planning: Timothy Juarez is a 70 y.o. male who presents with right foot ulceration and rest pain and non-reconstructable  vascular disease s/p right above-knee amputation - POD#1  1) 3 g drop in hemoglobin.  AM CBC. 2) creatinine normal 3) operating room dressing to be changed at some point this weekend  Discussed with Dr. Ellis Parents Zacharia Sowles PA-C 07/19/2021 1:27 PM

## 2021-07-19 NOTE — Evaluation (Signed)
Physical Therapy Evaluation Patient Details Name: Timothy Juarez MRN: [id number] DOB: 03-Sep-1950 Today's Date: 07/19/2021  History of Present Illness  Timothy Juarez is a 70 y.o. male who presents with right foot ulceration and rest pain and non-reconstructable vascular disease.  The patient is scheduled for a right above-the-knee amputation on 07/18/2021. Hx of L AKA. PMH includes: CHF, COPD, HLD, HTN.   Clinical Impression  Pt admitted with above diagnosis. Pt agreeable to PT intervention. Reports 8/10 NPS in RLE. Reports living at [facility], receiving assistance with ADL's/IADL's and prior to R AKA, he was independent with w/c mobility and transfers in<> out of w/c. With LE therex on RLE, limited ROM noted due to pain in RLE. To date, pt is modA at torso with HOB maximally elevated and extensive time and cuing for UE assist to sit up near EOB. In sitting with BUE support, pt has fair static sitting with no LOB tolerating ~5 min sitting for RN to give pt medications. However pt is very weak with limited ability to use UE's to assist with bed mobility. Required mod-MaxA to return to supine in bed with HOB elevated. Pt educated on positioning of RLE for edema management, LE therex, positioning to prevent contractures and future use of sliding board for transferring in/out of bed. Pt will require STR in order to optimize independence with bed mobility and transfers  in<> out of w/c with sliding board and improve UE/LE strength. Pt currently with functional limitations due to the deficits listed below (see PT Problem List). Pt will benefit from skilled PT to increase their independence and safety with mobility to allow discharge to the venue listed below.     Recommendations for follow up therapy are one component of a multi-disciplinary discharge planning process, led by the attending physician.  Recommendations may be updated based on patient status, additional functional criteria and insurance  authorization.  Follow Up Recommendations Skilled nursing-short term rehab (<3 hours/day)    Assistance Recommended at Discharge Intermittent Supervision/Assistance  Functional Status Assessment Patient has had a recent decline in their functional status and/or demonstrates limited ability to make significant improvements in function in a reasonable and predictable amount of time  Equipment Recommendations   (tbd by next venue of care)    Recommendations for Other Services       Precautions / Restrictions Precautions Precautions: Fall Restrictions Other Position/Activity Restrictions: B AKA; current admission for R AKA      Mobility  Bed Mobility Overal bed mobility: Needs Assistance Bed Mobility: Supine to Sit;Sit to Supine     Supine to sit: Mod assist;HOB elevated Sit to supine: Max assist   General bed mobility comments: HOB maximally elevated. Increased time and cuing to perform. Patient Response: Cooperative  Transfers                   General transfer comment: pt unable    Ambulation/Gait               General Gait Details: pt unable  Stairs            Wheelchair Mobility    Modified Rankin (Stroke Patients Only)       Balance Overall balance assessment: Needs assistance Sitting-balance support: Bilateral upper extremity supported;Feet unsupported Sitting balance-Leahy Scale: Fair Sitting balance - Comments: Maintains static sitting balance with BUE support without LOB       Standing balance comment: unable; B AKA uses w/c at baseline  Pertinent Vitals/Pain Pain Assessment: 0-10 Pain Score: 8  Pain Location: RLE Pain Descriptors / Indicators: Grimacing;Guarding Pain Intervention(s): Monitored during session;Patient requesting pain meds-RN notified;Repositioned    Home Living Family/patient expects to be discharged to:: Skilled nursing facility                   Additional  Comments: [facility]    Prior Function Prior Level of Function : Needs assist       Physical Assist : Mobility (physical);ADLs (physical)     Mobility Comments: Reports when had RLE, was independent with bed mobility and transfers and w/c mobility. ADLs Comments: Reports assist for meals, dressing, bathing.     Hand Dominance        Extremity/Trunk Assessment   Upper Extremity Assessment Upper Extremity Assessment: Generalized weakness;Overall [facility] for tasks assessed    Lower Extremity Assessment Lower Extremity Assessment: Generalized weakness;RLE deficits/detail;[name] deficits/detail RLE Deficits / Details: AKA [name] Deficits / Details: Previous AKA    Cervical / Trunk Assessment Cervical / Trunk Assessment: Normal  Communication   Communication: No difficulties  Cognition Arousal/Alertness: Awake/alert Behavior During Therapy: WFL for tasks assessed/performed Overall Cognitive Status: Within Functional Limits for tasks assessed                                          General Comments      Exercises General Exercises - Lower Extremity Hip ABduction/ADduction: AROM;Right;10 reps;Supine Straight Leg Raises: AROM;Right;5 reps Other Exercises Other Exercises: Role of PT in acute setting, d/c recs, education on LE exercise, preventing L hip flexion contractures, future use of sliding boards for safe transfers.   Assessment/Plan    PT Assessment Patient needs continued PT services  PT Problem List Decreased strength;Decreased range of motion;Decreased activity tolerance;Decreased safety awareness;Pain;Decreased mobility       PT Treatment Interventions DME instruction;Balance training;Neuromuscular re-education;Functional mobility training;Wheelchair mobility training;Therapeutic activities;Therapeutic exercise;Patient/family education    PT Goals (Current goals can be found in the Care Plan section)  Acute Rehab PT Goals Patient Stated Goal:  to improve pain PT Goal Formulation: With patient Time For Goal Achievement: 08/02/21 Potential to Achieve Goals: Fair    Frequency Min 2X/week   Barriers to discharge        Co-evaluation               AM-PAC PT "6 Clicks" Mobility  Outcome Measure Help needed turning from your back to your side while in a flat bed without using bedrails?: A Lot Help needed moving from lying on your back to sitting on the side of a flat bed without using bedrails?: A Lot Help needed moving to and from a bed to a chair (including a wheelchair)?: A Lot Help needed standing up from a chair using your arms (e.g., wheelchair or bedside chair)?: Total Help needed to walk in hospital room?: Total Help needed climbing 3-5 steps with a railing? : Total 6 Click Score: 9    End of Session   Activity Tolerance: Patient limited by pain Patient left: in bed;with call bell/phone within reach;with nursing/sitter in room;with bed alarm set Nurse Communication: Mobility status PT Visit Diagnosis: Other abnormalities of gait and mobility (R26.89);Muscle weakness (generalized) (M62.81)    Time: 3419-3790 PT Time Calculation (min) (ACUTE ONLY): 25 min   Charges:   PT Evaluation $PT Eval Moderate Complexity: 1 Mod PT Treatments $Therapeutic Activity:  8-22 mins        Salem Caster. Fairly IV, PT, DPT Physical Therapist- [facility]  07/19/2021, 11:18 AM

## 2021-07-19 NOTE — Anesthesia Postprocedure Evaluation (Signed)
Anesthesia Post Note  Patient: Timothy Juarez  Procedure(s) Performed: AMPUTATION ABOVE KNEE (Right: Knee)  Patient location during evaluation: PACU Anesthesia Type: General Level of consciousness: awake Pain management: satisfactory to patient Vital Signs Assessment: post-procedure vital signs reviewed and stable Respiratory status: spontaneous breathing Cardiovascular status: blood pressure returned to baseline Anesthetic complications: no   No notable events documented.   Last Vitals:  Vitals:   07/19/21 0132 07/19/21 0305  BP: (!) 129/56 (!) 119/58  Pulse: 66 62  Resp: 18 20  Temp: 36.9 C 37.3 C  SpO2: 91% 92%    Last Pain:  Vitals:   07/19/21 0305  TempSrc: Oral  PainSc:                  VAN STAVEREN,Jesslyn Viglione

## 2021-07-19 NOTE — Evaluation (Signed)
Occupational Therapy Evaluation Patient Details Name: Timothy Juarez MRN: [id number] DOB: 07-01-1951 Today's Date: 07/19/2021   History of Present Illness Timothy Juarez is a 70 y.o. male who presents with right foot ulceration and rest pain and non-reconstructable vascular disease.  The patient is scheduled for a right above-the-knee amputation on 07/18/2021. Hx of L AKA. PMH includes: CHF, COPD, HLD, HTN.   Clinical Impression   Patient presenting with decreased Ind in self care, balance, functional mobility/transfers, endurance, and safety awareness. Patient reports being a long term resident at [facility]. At baseline, he performs self care tasks on EOB and transfers into wheelchair for squat pivot and performs all other tasks at wheelchair level. Pt needing maximal encouragement to participate with total A to get to EOB. Pt needing min A initially for static sitting balance and progressing to supervision with him using B UEs for support.  Pt fatigues very quickly and sits on EOB for ~ 5 minutes before returning to supine. Patient will benefit from acute OT to increase overall independence in the areas of ADLs, functional mobility, and safety awareness in order to safely discharge to next venue of care.      Recommendations for follow up therapy are one component of a multi-disciplinary discharge planning process, led by the attending physician.  Recommendations may be updated based on patient status, additional functional criteria and insurance authorization.   Follow Up Recommendations  Skilled nursing-short term rehab (<3 hours/day)    Assistance Recommended at Discharge Frequent or constant Supervision/Assistance  Functional Status Assessment  Patient has had a recent decline in their functional status and demonstrates the ability to make significant improvements in function in a reasonable and predictable amount of time.  Equipment Recommendations  Other (comment) (defer to  next venue of care)       Precautions / Restrictions Precautions Precautions: Fall Restrictions Other Position/Activity Restrictions: B AKA; current admission for R AKA      Mobility Bed Mobility Overal bed mobility: Needs Assistance Bed Mobility: Supine to Sit;Sit to Supine     Supine to sit: Total assist Sit to supine: Max assist   General bed mobility comments: HOB maximally elevated. Increased time and cuing to perform.    Transfers                   General transfer comment: pt unable secondary to safety      Balance Overall balance assessment: Needs assistance Sitting-balance support: Bilateral upper extremity supported;Feet unsupported Sitting balance-Leahy Scale: Fair Sitting balance - Comments: Maintains static sitting balance with BUE support without LOB       Standing balance comment: unable; B AKA uses w/c at baseline                           ADL either performed or assessed with clinical judgement   ADL Overall ADL's : Needs assistance/impaired     Grooming: Wash/dry hands;Wash/dry face;Bed level;Set up                                 General ADL Comments: OT anticipates min A for dynamic sitting balance for UB self care. Max - total A for LB self care.     Vision Patient Visual Report: No change from baseline              Pertinent Vitals/Pain Pain Assessment: 0-10 Pain  Score: 8  Pain Location: RLE Pain Descriptors / Indicators: Grimacing;Guarding Pain Intervention(s): Monitored during session;Patient requesting pain meds-RN notified;Repositioned     Hand Dominance Right   Extremity/Trunk Assessment Upper Extremity Assessment Upper Extremity Assessment: Overall WFL for tasks assessed;Generalized weakness   Lower Extremity Assessment Lower Extremity Assessment: Overall WFL for tasks assessed;RLE deficits/detail;[name] deficits/detail RLE Deficits / Details: AKA [name] Deficits / Details: Previous AKA    Cervical / Trunk Assessment Cervical / Trunk Assessment: Normal   Communication Communication Communication: No difficulties   Cognition Arousal/Alertness: Awake/alert Behavior During Therapy: WFL for tasks assessed/performed Overall Cognitive Status: Within Functional Limits for tasks assessed                                          Exercises General Exercises - Lower Extremity Hip ABduction/ADduction: AROM;Right;10 reps;Supine Straight Leg Raises: AROM;Right;5 reps Other Exercises Other Exercises: Role of PT in acute setting, d/c recs, education on LE exercise, preventing L hip flexion contractures, future use of sliding boards for safe transfers.        Home Living Family/patient expects to be discharged to:: Skilled nursing facility                                 Additional Comments: [facility]      Prior Functioning/Environment Prior Level of Function : Needs assist       Physical Assist : Mobility (physical);ADLs (physical)     Mobility Comments: Reports when had RLE, was independent with bed mobility and transfers and w/c mobility. ADLs Comments: Reports sitting on EOB for basin bathing and dressing. Staff bring meal to room.        OT Problem List: Decreased strength;Pain;Decreased activity tolerance;Decreased safety awareness;Impaired balance (sitting and/or standing);Decreased knowledge of use of DME or AE      OT Treatment/Interventions: Self-care/ADL training;Therapeutic exercise;Therapeutic activities;Energy conservation;DME and/or AE instruction;Patient/family education;Balance training;Manual therapy    OT Goals(Current goals can be found in the care plan section) Acute Rehab OT Goals Patient Stated Goal: none stated OT Goal Formulation:  (pt declined to answer) Time For Goal Achievement: 08/02/21 Potential to Achieve Goals: Fair ADL Goals Pt Will Perform Grooming: with set-up;sitting Pt Will Perform Lower  Body Dressing: with set-up;bed level Pt Will Transfer to Toilet: anterior/posterior transfer;with min assist Pt Will Perform Toileting - Clothing Manipulation and hygiene: with mod assist  OT Frequency: Min 2X/week   Barriers to D/C:    none known at this time          AM-PAC OT "6 Clicks" Daily Activity     Outcome Measure Help from another person eating meals?: None Help from another person taking care of personal grooming?: A Little Help from another person toileting, which includes using toliet, bedpan, or urinal?: Total Help from another person bathing (including washing, rinsing, drying)?: A Lot Help from another person to put on and taking off regular upper body clothing?: A Lot Help from another person to put on and taking off regular lower body clothing?: A Lot 6 Click Score: 14   End of Session Nurse Communication: Mobility status  Activity Tolerance: Patient tolerated treatment well Patient left: in bed;with call bell/phone within reach;with bed alarm set;with family/visitor present  OT Visit Diagnosis: Muscle weakness (generalized) (M62.81);Pain Pain - Right/Left: Right Pain - part of body: Leg  Time: 0600-4599 OT Time Calculation (min): 20 min Charges:  OT General Charges $OT Visit: 1 Visit OT Evaluation $OT Eval Moderate Complexity: 1 Mod OT Treatments $Therapeutic Activity: 8-22 mins  Darleen Crocker, MS, OTR/L , CBIS ascom [phone]  07/19/21, 2:04 PM

## 2021-07-19 NOTE — Progress Notes (Signed)
PHARMACIST - PHYSICIAN COMMUNICATION  CONCERNING: IV to Oral Route Change Policy  RECOMMENDATION: This patient is receiving famotidine by the intravenous route.  Based on criteria approved by the Pharmacy and Therapeutics Committee, the intravenous medication(s) is/are being converted to the equivalent oral dose form(s).   DESCRIPTION: These criteria include: The patient is eating (either orally or via tube) and/or has been taking other orally administered medications for a least 24 hours The patient has no evidence of active gastrointestinal bleeding or impaired GI absorption (gastrectomy, short bowel, patient on TNA or NPO).  If you have questions about this conversion, please contact the [place] 07/19/2021 11:51 AM

## 2021-07-19 NOTE — TOC Initial Note (Signed)
Transition of Care [facility]) - Initial/Assessment Note    Patient Details  Name: Timothy Juarez MRN: [id number] Date of Birth: February 12, 1951  [facility] CM/SW Contact:    Magnus Ivan, LCSW Phone Number: 07/19/2021, 12:29 PM  Clinical Narrative:                Patient is a long term care resident at [facility]. Confirmed with Neoma Laming in Admissions. Neoma Laming stated they an do short term rehab as recommended by PT when patient returns to SNF.  Per MD, plan for DC tomorrow. Deborah asked for DC Summary to be completed today so they can prepare patient's medications for a weekend DC. MD aware.   Expected Discharge Plan: Skilled Nursing Facility Barriers to Discharge: Continued Medical Work up   Patient Goals and CMS Choice        Expected Discharge Plan and Services Expected Discharge Plan: [place] arrangements for the past 2 months: [place]                                      Prior Living Arrangements/Services Living arrangements for the past 2 months: [place] Lives with:: Facility Resident                   Activities of Daily Living Home Assistive Devices/Equipment: Wheelchair ADL Screening (condition at time of admission) Patient's cognitive ability adequate to safely complete daily activities?: Yes Is the patient deaf or have difficulty hearing?: No Does the patient have difficulty seeing, even when wearing glasses/contacts?: No Does the patient have difficulty concentrating, remembering, or making decisions?: No Patient able to express need for assistance with ADLs?: Yes Does the patient have difficulty dressing or bathing?: Yes Independently performs ADLs?: No Communication: Independent Dressing (OT): Dependent Is this a change from baseline?: Change from baseline, expected to last <3days Grooming: Needs assistance Is this a change from baseline?: Change  from baseline, expected to last >3 days Feeding: Needs assistance Is this a change from baseline?: Change from baseline, expected to last <3 days Toileting: Needs assistance Is this a change from baseline?: Change from baseline, expected to last <3 days In/Out Bed: Dependent Is this a change from baseline?: Pre-admission baseline Does the patient have difficulty walking or climbing stairs?: Yes Weakness of Legs: Both Weakness of Arms/Hands: Both  Permission Sought/Granted                  Emotional Assessment         Alcohol / Substance Use: Not Applicable Psych Involvement: No (comment)  Admission diagnosis:  Atherosclerosis of artery of extremity with rest pain ([place]) [I70.229] Patient Active Problem List   Diagnosis Date Noted   Atherosclerosis of artery of extremity with rest pain ([place]) 07/18/2021   Ischemic leg 03/21/2021   Esophagitis    Esophageal ulcer with bleeding 04/28/2020   Chronic diastolic CHF (congestive heart failure) ([place]) 04/27/2020   Leukocytosis 04/27/2020   Alcohol use 04/27/2020   Pulmonary disease    Hypotension 09/29/2019   History of COVID-19 09/27/2019   Abscess of left AKA stump 09/27/2019   (HFpEF) heart failure with preserved ejection fraction ([place]) 09/27/2019   COPD with chronic bronchitis ([place]) 09/27/2019   Chronic anticoagulation with eliquis 09/27/2019   Acute on chronic respiratory failure with hypoxia ([place]) 12/27/2018  Community acquired bilateral lower lobe pneumonia 12/27/2018   COVID-19 virus infection 12/27/2018   Acute respiratory failure with hypoxia ([place]) 12/27/2018   Hx of AKA (above knee amputation), left ([place]) 11/02/2018   Symptomatic anemia 10/10/2017   Chronic deep vein thrombosis (DVT) of left lower extremity (HCC)    Iron deficiency anemia 04/21/2017   Essential hypertension 03/13/2017   Recurrent deep vein thrombosis (DVT) ([place]) 03/11/2017   Hyperlipidemia 11/04/2016   Pressure ulcer 04/11/2016   Pseudoaneurysm  of left femoral artery ([place]) 04/11/2016   Atherosclerosis of native arteries of extremity with rest pain ([place]) 04/10/2016   Atherosclerosis of native arteries of extremities with gangrene, left leg ([place]) 11/01/2015   Ischemia of lower extremity 09/14/2015   Atherosclerotic peripheral vascular disease with ulceration ([place]) 01/18/2015   PCP:  Brayton Mars, MD Pharmacy:   [place] [place] [facility] [address] [place] [address] Phone: [phone] Fax: [phone]     Social Determinants of Health (SDOH) Interventions    Readmission Risk Interventions No flowsheet data found.

## 2021-07-20 DIAGNOSIS — M79671 Pain in right foot: Secondary | ICD-10-CM | POA: Diagnosis not present

## 2021-07-20 DIAGNOSIS — I70235 Atherosclerosis of native arteries of right leg with ulceration of other part of foot: Secondary | ICD-10-CM | POA: Diagnosis not present

## 2021-07-20 LAB — BASIC METABOLIC PANEL
Anion gap: 8 (ref 5–15)
BUN: 13 mg/dL (ref 8–23)
CO2: 24 mmol/L (ref 22–32)
Calcium: 7.7 mg/dL — ABNORMAL LOW (ref 8.9–10.3)
Chloride: 106 mmol/L (ref 98–111)
Creatinine, Ser: 0.94 mg/dL (ref 0.61–1.24)
GFR, Estimated: >60 mL/min (ref >60)
Glucose, Bld: 107 mg/dL — ABNORMAL HIGH (ref 70–99)
Potassium: 3.5 mmol/L (ref 3.5–5.1)
Sodium: 138 mmol/L (ref 135–145)

## 2021-07-20 LAB — CBC
HCT: 27.8 % — ABNORMAL LOW (ref 39.0–52.0)
Hemoglobin: 8.7 g/dL — ABNORMAL LOW (ref 13.0–17.0)
MCH: 25.2 pg — ABNORMAL LOW (ref 26.0–34.0)
MCHC: 31.3 g/dL (ref 30.0–36.0)
MCV: 80.6 fL (ref 80.0–100.0)
Platelets: 284 10*3/uL (ref 150–400)
RBC: 3.45 MIL/uL — ABNORMAL LOW (ref 4.22–5.81)
RDW: 15.7 % — ABNORMAL HIGH (ref 11.5–15.5)
WBC: 16.3 10*3/uL — ABNORMAL HIGH (ref 4.0–10.5)
nRBC: 0 % (ref 0.0–0.2)

## 2021-07-20 NOTE — Progress Notes (Signed)
2 Days Post-Op   Subjective/Chief Complaint: Patient seems to be comfortable.   Objective: Vital signs in last 24 hours: Temp:  [98.3 F (36.8 C)-99 F (37.2 C)] 98.3 F (36.8 C) (12/03 0802) Pulse Rate:  [55-72] 58 (12/03 0802) Resp:  [18-20] 20 (12/03 0802) BP: (104-126)/(50-53) 126/50 (12/03 0802) SpO2:  [86 %-94 %] 90 % (12/03 0802) Last BM Date: 07/18/21  Intake/Output from previous day: 12/02 0701 - 12/03 0700 In: 887.7 [P.O.:120; I.V.:767.7] Out: 600 [Urine:600] Intake/Output this shift: Total I/O In: 60 [P.O.:60] Out: 350 [Urine:350]  General appearance: alert, cooperative, and appears stated age Resp: clear to auscultation bilaterally and normal percussion bilaterally Cardio: regular rate and rhythm, S1, S2 normal, no murmur, click, rub or gallop Incision/Wound: Right AKA dressing is intact.  Dressing removed, wound with no erythema, some bleeding from the incision.  New none adherent dressings applied to the wound and wrapped with Kerlex and Ace 6in bandage. Fixed to the skin with paper tape.   Lab Results:  Recent Labs    07/19/21 0302 07/20/21 0619  WBC 19.2* 16.3*  HGB 9.4* 8.7*  HCT 29.5* 27.8*  PLT 340 284   BMET Recent Labs    07/19/21 0302 07/20/21 0619  NA 135 138  K 4.2 3.5  CL 107 106  CO2 23 24  GLUCOSE 127* 107*  BUN 13 13  CREATININE 0.82 0.94  CALCIUM 8.2* 7.7*   PT/INR No results for input(s): LABPROT, INR in the last 72 hours. ABG No results for input(s): PHART, HCO3 in the last 72 hours.  Invalid input(s): PCO2, PO2  Studies/Results: No results found.  Anti-infectives: Anti-infectives (From admission, onward)    Start     Dose/Rate Route Frequency Ordered Stop   07/18/21 2200  doxycycline (MONODOX) capsule 100 mg  Status:  Discontinued       Note to Pharmacy: 1 capsule Twice a day for 10 days     100 mg Oral 2 times daily 07/18/21 1700 07/18/21 1758   07/18/21 2200  doxycycline (VIBRA-TABS) tablet 100 mg  Status:   Discontinued        100 mg Oral Every 12 hours 07/18/21 1809 07/19/21 0837   07/18/21 2000  ciprofloxacin (CIPRO) tablet 500 mg  Status:  Discontinued       Note to Pharmacy: Twice a day for 10 days     500 mg Oral 2 times daily 07/18/21 1700 07/19/21 0837   07/18/21 1136  ceFAZolin (ANCEF) 2-4 GM/100ML-% IVPB       Note to Pharmacy: Herby Abraham W: cabinet override      07/18/21 1136 07/18/21 1319   07/18/21 0600  ceFAZolin (ANCEF) IVPB 2g/100 mL premix        2 g 200 mL/hr over 30 Minutes Intravenous On call to O.R. 07/18/21 0350 07/18/21 1345       Assessment/Plan: s/p Procedure(s): AMPUTATION ABOVE KNEE (Right) Continue with PT/OT Plan for discharge to rehab  LOS: 2 days    [name] 07/20/2021

## 2021-07-20 NOTE — Plan of Care (Signed)

## 2021-07-20 NOTE — TOC Progression Note (Addendum)
Transition of Care [facility]) - Progression Note    Patient Details  Name: NAJEEB UPTAIN MRN: [id number] Date of Birth: [birth year]  Transition of Care [facility]) CM/SW [place], LCSW Phone Number: 07/20/2021, 10:17 AM  Clinical Narrative:   DC Summary was not put in yesterday. Asked Neoma Laming at [facility] if they can still take patient today. Waiting on reply.  1:45- Attempted call to [employer] with [facility]. Left another message requesting a return call.   Expected Discharge Plan: [place] Barriers to Discharge: Continued Medical Work up  Expected Discharge Plan and Services Expected Discharge Plan: [place] arrangements for the past 2 months: [place]                                       Social Determinants of Health (SDOH) Interventions    Readmission Risk Interventions No flowsheet data found.

## 2021-07-21 NOTE — TOC Progression Note (Addendum)
Transition of Care [facility]) - Progression Note    Patient Details  Name: Timothy Juarez MRN: [id number] Date of Birth: [birth year]  Transition of Care [facility]) CM/SW [place], LCSW Phone Number: 07/21/2021, 8:50 AM  Clinical Narrative:   Reached out to Neoma Laming at [facility] again. Neoma Laming stated they cannot take admissions today. Can take patient tomorrow.  Asked MD and RN for COVID test today in preparation for SNF.   Expected Discharge Plan: [place] Barriers to Discharge: Continued Medical Work up  Expected Discharge Plan and Services Expected Discharge Plan: [place] arrangements for the past 2 months: [place]                                       Social Determinants of Health (SDOH) Interventions    Readmission Risk Interventions No flowsheet data found.

## 2021-07-21 NOTE — Progress Notes (Signed)
Patient ID: Timothy Juarez, male   DOB: [birth year], 70 y.o.   MRN: [id number] Patient is postop day 3 and not in acute distress.  He is comfortable and denies any issues at this time.  Patient's right above-the-knee amputation site dressing is intact.  Patient's vital signs are stable.  Rest of his clinical examination is unremarkable.  Continue with current care and PT OT. Patient will be placed in rehab tomorrow.

## 2021-07-22 DIAGNOSIS — I70235 Atherosclerosis of native arteries of right leg with ulceration of other part of foot: Secondary | ICD-10-CM | POA: Diagnosis not present

## 2021-07-22 DIAGNOSIS — M79671 Pain in right foot: Secondary | ICD-10-CM | POA: Diagnosis not present

## 2021-07-22 LAB — BASIC METABOLIC PANEL
Anion gap: 6 (ref 5–15)
BUN: 15 mg/dL (ref 8–23)
CO2: 27 mmol/L (ref 22–32)
Calcium: 8.8 mg/dL — ABNORMAL LOW (ref 8.9–10.3)
Chloride: 106 mmol/L (ref 98–111)
Creatinine, Ser: 1.09 mg/dL (ref 0.61–1.24)
GFR, Estimated: >60 mL/min (ref >60)
Glucose, Bld: 127 mg/dL — ABNORMAL HIGH (ref 70–99)
Potassium: 4.5 mmol/L (ref 3.5–5.1)
Sodium: 139 mmol/L (ref 135–145)

## 2021-07-22 LAB — CBC
HCT: 34.9 % — ABNORMAL LOW (ref 39.0–52.0)
Hemoglobin: 10.8 g/dL — ABNORMAL LOW (ref 13.0–17.0)
MCH: 24.8 pg — ABNORMAL LOW (ref 26.0–34.0)
MCHC: 30.9 g/dL (ref 30.0–36.0)
MCV: 80.2 fL (ref 80.0–100.0)
Platelets: 443 10*3/uL — ABNORMAL HIGH (ref 150–400)
RBC: 4.35 MIL/uL (ref 4.22–5.81)
RDW: 16 % — ABNORMAL HIGH (ref 11.5–15.5)
WBC: 17.4 10*3/uL — ABNORMAL HIGH (ref 4.0–10.5)
nRBC: 0.2 % (ref 0.0–0.2)

## 2021-07-22 LAB — SURGICAL PATHOLOGY

## 2021-07-22 LAB — RESP PANEL BY RT-PCR (FLU A&B, COVID) ARPGX2
Influenza A by PCR: NEGATIVE
Influenza B by PCR: NEGATIVE
SARS Coronavirus 2 by RT PCR: NEGATIVE

## 2021-07-22 NOTE — Discharge Summary (Signed)
[facility]    Discharge Summary  Patient ID:  Timothy Juarez MRN: [id number] DOB/AGE: 1951/04/14 70 y.o.  Admit date: 07/18/2021 Discharge date: 07/22/2021 Date of Surgery: 07/18/2021 Surgeon: Surgeon(s): Lucky Cowboy Erskine Squibb, MD  Admission Diagnosis: Atherosclerosis of artery of extremity with rest pain [facility]) [I70.229]  Discharge Diagnoses:  Atherosclerosis of artery of extremity with rest pain [facility]) [I70.229]  Secondary Diagnoses: Past Medical History:  Diagnosis Date   Acute embolism and thombos unsp deep vn unsp lower extremity ([place])    Acute respiratory failure ([place])    AKI (acute kidney injury) ([place]) 12/27/2018   Allergy    Anemia    Aneurysm of unspecified site ([place])    ARF (acute respiratory failure) (HCC)    H/O   Bronchitis    Cellulitis and abscess of leg 09/27/2019   CHF (congestive heart failure) (HCC)    COPD (chronic obstructive pulmonary disease) (HCC)    Cough    Epistaxis    Gastrointestinal hemorrhage    GERD (gastroesophageal reflux disease)    Gout    Hematemesis 04/27/2020   High anion gap metabolic acidosis 25/00/3704   Hyperlipidemia    Hypertension    Hypokalemia    Infection of above knee amputation stump ([place]) 09/27/2019   Insomnia    Iron deficiency anemia 04/21/2017   Muscle contracture    MUSCLE SPASMS, muscle weakness   Peripheral vascular disease (HCC)    Pneumonia    Pressure ulcer    Severe sepsis ([place]) 12/27/2018   Toxic encephalopathy 12/27/2018   Procedure(s): 07/18/21: Right above-the-knee amputation   Discharged Condition: Good  HPI / Hospital Course:  Timothy Juarez is a 70 y.o. male who presents with right foot ulceration and rest pain and non-reconstructable vascular disease.  The patient is scheduled for a right above-the-knee amputation.  I discussed in depth with the patient the risks, benefits, and alternatives to this procedure.  The patient is aware that the risk of this operation  included but are not limited to:  bleeding, infection, myocardial infarction, stroke, death, failure to heal amputation wound, and possible need for more proximal amputation.  The patient is aware of the risks and agrees proceed forward with the procedure. On 07/18/21, the patient underwent:  Right above-the-knee amputation   The patient tolerated the procedure and was transferred from the operating room to the recovery room without issue.  Patient's night of surgery was unremarkable. During the patient's brief stay, his diet was advanced, he was urinating on his own, his pain was controlled with the use of p.o. pain medication he was ambulating with assistance.  Patient was seen by physical and Occupational Therapy who deemed it appropriate for him to be discharged to SNF.  Day of discharge the patient was afebrile with stable vital signs and a healthy above-the-knee amputation stump.  Physical Exam:  A&Ox3, NAD CV: RRR Pulmonary: CTA Bilaterally Abdomen: Soft, Nontender, Nondistended Vascular:             Right lower extremity: Thigh soft.  Stump is healthy. Staples: clean dry and intact.  Labs: As below  Complications: None  Consults: None  Significant Diagnostic Studies: CBC Lab Results  Component Value Date   WBC 17.4 (H) 07/22/2021   HGB 10.8 (L) 07/22/2021   HCT 34.9 (L) 07/22/2021   MCV 80.2 07/22/2021   PLT 443 (H) 07/22/2021   BMET    Component Value Date/Time   NA 139 07/22/2021 1131   NA 136  12/01/2013 1317   K 4.5 07/22/2021 1131   K 3.9 12/01/2013 1317   CL 106 07/22/2021 1131   CL 100 12/01/2013 1317   CO2 27 07/22/2021 1131   CO2 33 (H) 12/01/2013 1317   GLUCOSE 127 (H) 07/22/2021 1131   GLUCOSE 91 12/01/2013 1317   BUN 15 07/22/2021 1131   BUN 24 (H) 09/25/2014 0846   CREATININE 1.09 07/22/2021 1131   CREATININE 1.29 09/25/2014 0846   CALCIUM 8.8 (L) 07/22/2021 1131   CALCIUM 9.4 12/01/2013 1317   GFRNONAA >60 07/22/2021 1131   GFRNONAA 60 (L)  09/25/2014 0846   GFRNONAA >60 03/16/2014 0750   GFRAA >60 04/30/2020 0650   GFRAA >60 09/25/2014 0846   GFRAA >60 03/16/2014 0750   COAG Lab Results  Component Value Date   INR 1.1 03/21/2021   INR 1.2 04/27/2020   INR 1.1 09/29/2019   Disposition:  Discharge to :Home  Allergies as of 07/22/2021   No Known Allergies      Medication List     STOP taking these medications    cefdinir 300 MG capsule Commonly known as: OMNICEF       TAKE these medications    acetaminophen 325 MG tablet Commonly known as: TYLENOL Take 650 mg by mouth every 4 (four) hours as needed for fever.   acidophilus Caps capsule Take 1 capsule by mouth 2 (two) times daily.   apixaban 2.5 MG Tabs tablet Commonly known as: Eliquis Take 1 tablet (2.5 mg total) by mouth 2 (two) times daily.   ascorbic acid 500 MG tablet Commonly known as: VITAMIN C Take 500 mg by mouth 2 (two) times daily.   aspirin 81 MG EC tablet Take 1 tablet (81 mg total) by mouth daily. Swallow whole.   atorvastatin 10 MG tablet Commonly known as: Lipitor Take 1 tablet (10 mg total) by mouth daily.   baclofen 10 MG tablet Commonly known as: LIORESAL Take 10 mg by mouth 3 (three) times daily. Hold for sedation   Combigan 0.2-0.5 % ophthalmic solution Generic drug: brimonidine-timolol Place 1 drop into the right eye 2 (two) times daily.   docusate sodium 100 MG capsule Commonly known as: COLACE Take 100 mg by mouth daily as needed for mild constipation.   fentaNYL 25 MCG/HR Commonly known as: [place] 1 patch onto the skin every 3 (three) days.   ferrous sulfate 324 (65 Fe) MG Tbec Take 324 mg by mouth 2 (two) times daily.   Fish Oil 1000 MG Caps Take 1,000 mg by mouth daily.   Fluticasone-Umeclidin-Vilant 100-62.5-25 MCG/ACT Aepb Inhale 1 puff into the lungs every morning.   furosemide 40 MG tablet Commonly known as: LASIX Take 40 mg by mouth daily.   gabapentin 100 MG capsule Commonly  known as: NEURONTIN Take 100 mg by mouth at bedtime.   guaiFENesin 100 MG/5ML Soln Commonly known as: ROBITUSSIN Take 15 mLs by mouth 3 (three) times daily as needed for cough (congestion).   ketorolac 0.5 % ophthalmic solution Commonly known as: ACULAR Place 1 drop into the left eye 2 (two) times daily.   loperamide 2 MG capsule Commonly known as: IMODIUM Take 2 mg by mouth 3 (three) times daily as needed for diarrhea or loose stools.   nitroGLYCERIN 0.4 MG SL tablet Commonly known as: NITROSTAT Place 0.14 mg under the tongue every 5 (five) minutes as needed for chest pain.   Oxycodone HCl 10 MG Tabs Take 10 mg by mouth every 4 (four) hours  as needed (severe pain).   oxymetazoline 0.05 % nasal spray Commonly known as: AFRIN Place 2 sprays into both nostrils 2 (two) times daily as needed (For nose bleeds).   pantoprazole 40 MG tablet Commonly known as: Protonix Take 1 tablet (40 mg total) by mouth 2 (two) times daily.   potassium chloride SA 20 MEQ tablet Commonly known as: KLOR-CON M Take 20 mEq by mouth daily.   sodium chloride 0.65 % Soln nasal spray Commonly known as: OCEAN Place 2 sprays into both nostrils 2 (two) times daily as needed for congestion.   tamsulosin 0.4 MG Caps capsule Commonly known as: FLOMAX Take 1 capsule (0.4 mg total) by mouth daily.       Verbal and written Discharge instructions given to the patient. Wound care per Discharge AVS  Follow-up Information     Kris Hartmann, NP Follow up in 3 week(s).   Specialty: Vascular Surgery Why: First post-op check. AKA. Staple removal. No studies. Contact information: [place] [address] [phone]                Signed: Sela Hua, PA-C 07/22/2021, 2:48 PM

## 2021-07-22 NOTE — Discharge Instructions (Signed)
Vascular surgery discharge instructions:  1) Daily dressing changes:  - Xeroform to incision line - Covered with ABD - Covered with Kerlix - Covered with Ace

## 2021-07-22 NOTE — Progress Notes (Signed)
Physical Therapy Treatment Patient Details Name: Timothy Juarez MRN: [id number] DOB: [birth year] Today's Date: 07/22/2021   History of Present Illness Timothy Juarez is a 69 y.o. male who presents with right foot ulceration and rest pain and non-reconstructable vascular disease.  The patient is scheduled for a right above-the-knee amputation on 07/18/2021. Hx of L AKA. PMH includes: CHF, COPD, HLD, HTN.    PT Comments    Pt received upright in bed agreeable to PT/OT co-treat. Pt progressing in mobility, LE therex with no pain until post session. Pt requires increased time, effort and max multimodal cuing to complete all bed mobility, scoot transfers from bed to recliner then recliner back to bed. Does display intermittent posterior lean requiring intermittent minA at shoulders to correct but overall displays fair static sitting balance with SUE or BUE support. Tolerated 8-10 min sitting EOB with OT on UE ADL's/IADL's. Pt also does rely on intermittent min to modA on chuck pad to scoot pt onto other surface with bed to recliner and recliner to bed transfer. Overall pt is progressing in safe mobility but will require STR due to deficits in UE/LE strength/endurance, bed mobility, and OOB transfers and poor safety awareness.    Recommendations for follow up therapy are one component of a multi-disciplinary discharge planning process, led by the attending physician.  Recommendations may be updated based on patient status, additional functional criteria and insurance authorization.  Follow Up Recommendations  Skilled nursing-short term rehab (<3 hours/day)     Assistance Recommended at Discharge Intermittent Supervision/Assistance  Equipment Recommendations   (next venue of care)    Recommendations for Other Services       Precautions / Restrictions Precautions Precautions: Fall Restrictions Other Position/Activity Restrictions: B AKA; current admission for R AKA     Mobility  Bed  Mobility Overal bed mobility: Needs Assistance Bed Mobility: Supine to Sit;Sit to Supine     Supine to sit: HOB elevated;Min assist Sit to supine: Min assist   General bed mobility comments: Takes increased time, cuing for LE and UE sequencing, minA at chuck pad intermittently Patient Response: Cooperative  Transfers Overall transfer level: Needs assistance   Transfers: Bed to chair/wheelchair/BSC            Lateral/Scoot Transfers: Supervision;+2 safety/equipment General transfer comment: increased time and effort to perform. CUing for LE and UE sequencing    Ambulation/Gait               General Gait Details: pt unable   Stairs             Wheelchair Mobility    Modified Rankin (Stroke Patients Only)       Balance Overall balance assessment: Needs assistance Sitting-balance support: Bilateral upper extremity supported;Feet unsupported Sitting balance-Leahy Scale: Fair Sitting balance - Comments: Maintains static sitting balance with BUE support without LOB Postural control: Posterior lean (intermittent post lean requiring intermittent minA to correct)     Standing balance comment: unable; B AKA uses w/c at baseline                            Cognition Arousal/Alertness: Awake/alert Behavior During Therapy: WFL for tasks assessed/performed Overall Cognitive Status: Within Functional Limits for tasks assessed  Exercises General Exercises - Lower Extremity Hip ABduction/ADduction: AROM;Right;10 reps;Supine Straight Leg Raises: AROM;Right;10 reps;Supine Other Exercises Other Exercises: Education on scoot transfers from bed <> recliner    General Comments        Pertinent Vitals/Pain Pain Assessment: No/denies pain (reports RLE pain post session) Pain Intervention(s): Monitored during session    Home Living                          Prior Function             PT Goals (current goals can now be found in the care plan section) Acute Rehab PT Goals Patient Stated Goal: to improve pain PT Goal Formulation: With patient Time For Goal Achievement: 08/02/21 Potential to Achieve Goals: Fair Progress towards PT goals: Progressing toward goals    Frequency    Min 2X/week      PT Plan Current plan remains appropriate    Co-evaluation PT/OT/SLP Co-Evaluation/Treatment: Yes Reason for Co-Treatment: Complexity of the patient's impairments (multi-system involvement);To address functional/ADL transfers;For patient/therapist safety PT goals addressed during session: Mobility/safety with mobility;Proper use of DME;Strengthening/ROM OT goals addressed during session: ADL's and self-care;Strengthening/ROM      AM-PAC PT "6 Clicks" Mobility   Outcome Measure  Help needed turning from your back to your side while in a flat bed without using bedrails?: A Lot Help needed moving from lying on your back to sitting on the side of a flat bed without using bedrails?: A Lot Help needed moving to and from a bed to a chair (including a wheelchair)?: A Lot Help needed standing up from a chair using your arms (e.g., wheelchair or bedside chair)?: Total Help needed to walk in hospital room?: Total Help needed climbing 3-5 steps with a railing? : Total 6 Click Score: 9    End of Session Equipment Utilized During Treatment: Gait belt Activity Tolerance: Patient tolerated treatment well Patient left: in bed;with call bell/phone within reach;with bed alarm set Nurse Communication: Mobility status PT Visit Diagnosis: Other abnormalities of gait and mobility (R26.89);Muscle weakness (generalized) (M62.81)     Time: 8937-3428 PT Time Calculation (min) (ACUTE ONLY): 44 min  Charges:  $Therapeutic Exercise: 8-22 mins $Therapeutic Activity: 8-22 mins                    Kecia Swoboda M. Fairly IV, PT, DPT Physical Therapist- [place]  07/22/2021, 11:57 AM

## 2021-07-22 NOTE — Progress Notes (Signed)
Timothy Juarez to be D/C'd Skilled nursing facility per MD order.  Discussed prescriptions and follow up appointments with the patient. Prescriptions given to patient, medication list explained in detail. Pt verbalized understanding.  Allergies as of 07/22/2021   No Known Allergies      Medication List     STOP taking these medications    cefdinir 300 MG capsule Commonly known as: OMNICEF       TAKE these medications    acetaminophen 325 MG tablet Commonly known as: TYLENOL Take 650 mg by mouth every 4 (four) hours as needed for fever.   acidophilus Caps capsule Take 1 capsule by mouth 2 (two) times daily.   apixaban 2.5 MG Tabs tablet Commonly known as: Eliquis Take 1 tablet (2.5 mg total) by mouth 2 (two) times daily.   ascorbic acid 500 MG tablet Commonly known as: VITAMIN C Take 500 mg by mouth 2 (two) times daily.   aspirin 81 MG EC tablet Take 1 tablet (81 mg total) by mouth daily. Swallow whole.   atorvastatin 10 MG tablet Commonly known as: Lipitor Take 1 tablet (10 mg total) by mouth daily.   baclofen 10 MG tablet Commonly known as: LIORESAL Take 10 mg by mouth 3 (three) times daily. Hold for sedation   Combigan 0.2-0.5 % ophthalmic solution Generic drug: brimonidine-timolol Place 1 drop into the right eye 2 (two) times daily.   docusate sodium 100 MG capsule Commonly known as: COLACE Take 100 mg by mouth daily as needed for mild constipation.   fentaNYL 25 MCG/HR Commonly known as: [place] 1 patch onto the skin every 3 (three) days.   ferrous sulfate 324 (65 Fe) MG Tbec Take 324 mg by mouth 2 (two) times daily.   Fish Oil 1000 MG Caps Take 1,000 mg by mouth daily.   Fluticasone-Umeclidin-Vilant 100-62.5-25 MCG/ACT Aepb Inhale 1 puff into the lungs every morning.   furosemide 40 MG tablet Commonly known as: LASIX Take 40 mg by mouth daily.   gabapentin 100 MG capsule Commonly known as: NEURONTIN Take 100 mg by mouth at  bedtime.   guaiFENesin 100 MG/5ML Soln Commonly known as: ROBITUSSIN Take 15 mLs by mouth 3 (three) times daily as needed for cough (congestion).   ketorolac 0.5 % ophthalmic solution Commonly known as: ACULAR Place 1 drop into the left eye 2 (two) times daily.   loperamide 2 MG capsule Commonly known as: IMODIUM Take 2 mg by mouth 3 (three) times daily as needed for diarrhea or loose stools.   nitroGLYCERIN 0.4 MG SL tablet Commonly known as: NITROSTAT Place 0.14 mg under the tongue every 5 (five) minutes as needed for chest pain.   Oxycodone HCl 10 MG Tabs Take 10 mg by mouth every 4 (four) hours as needed (severe pain).   oxymetazoline 0.05 % nasal spray Commonly known as: AFRIN Place 2 sprays into both nostrils 2 (two) times daily as needed (For nose bleeds).   pantoprazole 40 MG tablet Commonly known as: Protonix Take 1 tablet (40 mg total) by mouth 2 (two) times daily.   potassium chloride SA 20 MEQ tablet Commonly known as: KLOR-CON M Take 20 mEq by mouth daily.   sodium chloride 0.65 % Soln nasal spray Commonly known as: OCEAN Place 2 sprays into both nostrils 2 (two) times daily as needed for congestion.   tamsulosin 0.4 MG Caps capsule Commonly known as: FLOMAX Take 1 capsule (0.4 mg total) by mouth daily.  Vitals:   07/22/21 0757 07/22/21 1545  BP: (!) 113/56 111/63  Pulse: 80 85  Resp: 18 18  Temp: 98.7 F (37.1 C) 98.3 F (36.8 C)  SpO2: 98% 98%    Skin clean, dry and intact without evidence of skin break down, no evidence of skin tears noted. IV catheter discontinued intact. Site without signs and symptoms of complications. Dressing and pressure applied. Pt denies pain at this time. No complaints noted.  An After Visit Summary was printed and given to the patient. Patient escorted via stretcher and d/c via EMS  Timothy Juarez. Timothy Juarez

## 2021-07-22 NOTE — Care Management Important Message (Signed)
Important Message  Patient Details  Name: Timothy Juarez MRN: [id number] Date of Birth: [birth year]   Medicare Important Message Given:  Yes     Dannette Barbara 07/22/2021, 11:13 AM

## 2021-07-22 NOTE — Progress Notes (Signed)
Occupational Therapy Treatment Patient Details Name: Timothy Juarez MRN: [id number] DOB: 1951-07-14 Today's Date: 07/22/2021   History of present illness Timothy Juarez is a 70 y.o. male who presents with right foot ulceration and rest pain and non-reconstructable vascular disease.  The patient is scheduled for a right above-the-knee amputation on 07/18/2021. Hx of L AKA. PMH includes: CHF, COPD, HLD, HTN.   OT comments  Pt seen for OT/PT co-treatment on this date. Upon arrival to room, pt awake and seated upright in bed. Pt agreeable to tx and OOB mobility with moderate encouragement from OT/PT. Pt currently presents with decreased strength, balance, and activity tolerance. Due to these functional impairments, pt requires MIN A for bed mobility and SUPERVISION/SET-UP for seated UB dressing and grooming tasks. Pt able to tolerate sitting EOB for 8-10 mins this date while engaging in ADLs at EOB. Pt also performed lateral/scoot transfers bed<>recliner, requiring x2 people for safety and intermittent MIN A in setting of posterior lean. Pt continues to benefit from skilled OT services to maximize return to PLOF and minimize risk of future falls, injury, caregiver burden, and readmission. Will continue to follow POC. Discharge recommendation remains appropriate.     Recommendations for follow up therapy are one component of a multi-disciplinary discharge planning process, led by the attending physician.  Recommendations may be updated based on patient status, additional functional criteria and insurance authorization.    Follow Up Recommendations  Skilled nursing-short term rehab (<3 hours/day)    Assistance Recommended at Discharge Frequent or constant Supervision/Assistance  Equipment Recommendations  Other (comment) (defer to next venue of care)       Precautions / Restrictions Precautions Precautions: Fall Restrictions Weight Bearing Restrictions: No Other Position/Activity Restrictions:  B AKA; current admission for R AKA       Mobility Bed Mobility Overal bed mobility: Needs Assistance Bed Mobility: Supine to Sit;Sit to Supine     Supine to sit: HOB elevated;Min assist Sit to supine: Min assist   General bed mobility comments: Takes increased time, cuing for LE and UE sequencing, min A at chuck pad intermittently    Transfers Overall transfer level: Needs assistance   Transfers: Bed to chair/wheelchair/BSC            Lateral/Scoot Transfers: Supervision;+2 safety/equipment General transfer comment: increased time and effort to perform. Cuing for LE and UE sequencing     Balance Overall balance assessment: Needs assistance Sitting-balance support: No upper extremity supported;Feet unsupported Sitting balance-Leahy Scale: Fair Sitting balance - Comments: while engaging in seated grooming/UB dressing, requires supervision Postural control: Posterior lean (intermittent post lean requiring intermittent minA to correct)     Standing balance comment: unable; B AKA uses w/c at baseline                           ADL either performed or assessed with clinical judgement   ADL Overall ADL's : Needs assistance/impaired     Grooming: Wash/dry face;Oral care;Supervision/safety;Set up;Sitting           Upper Body Dressing : Supervision/safety;Set up;Sitting                          Extremity/Trunk Assessment Upper Extremity Assessment Upper Extremity Assessment: Generalized weakness   Lower Extremity Assessment Lower Extremity Assessment: Generalized weakness RLE Deficits / Details: AKA [name] Deficits / Details: Previous AKA   Cervical / Trunk Assessment Cervical / Trunk Assessment: Normal  Vision Ability to See in Adequate Light: 0 Adequate Patient Visual Report: No change from baseline            Cognition Arousal/Alertness: Awake/alert Behavior During Therapy: WFL for tasks assessed/performed Overall Cognitive  Status: Within Functional Limits for tasks assessed                                 General Comments: Requires significant encouragement to attempt OOB mobility                     Pertinent Vitals/ Pain       Pain Assessment: No/denies pain (reports RLE pain post session) Pain Intervention(s): Monitored during session         Frequency  Min 2X/week        Progress Toward Goals  OT Goals(current goals can now be found in the care plan section)  Progress towards OT goals: Progressing toward goals  Acute Rehab OT Goals Patient Stated Goal: to leave hospital OT Goal Formulation: With patient Time For Goal Achievement: 08/02/21 Potential to Achieve Goals: Good  Plan Discharge plan remains appropriate;Frequency remains appropriate    Co-evaluation    PT/OT/SLP Co-Evaluation/Treatment: Yes Reason for Co-Treatment: Complexity of the patient's impairments (multi-system involvement);For patient/therapist safety;To address functional/ADL transfers PT goals addressed during session: Mobility/safety with mobility;Strengthening/ROM OT goals addressed during session: ADL's and self-care;Strengthening/ROM      AM-PAC OT "6 Clicks" Daily Activity     Outcome Measure   Help from another person eating meals?: None Help from another person taking care of personal grooming?: A Little Help from another person toileting, which includes using toliet, bedpan, or urinal?: A Lot Help from another person bathing (including washing, rinsing, drying)?: A Lot Help from another person to put on and taking off regular upper body clothing?: A Little Help from another person to put on and taking off regular lower body clothing?: A Lot 6 Click Score: 16    End of Session Equipment Utilized During Treatment: Gait belt  OT Visit Diagnosis: Muscle weakness (generalized) (M62.81);Pain Pain - Right/Left: Right Pain - part of body: Leg   Activity Tolerance Patient tolerated  treatment well   Patient Left in bed;with call bell/phone within reach;with bed alarm set   Nurse Communication Mobility status        Time: 6553-7482 OT Time Calculation (min): 44 min  Charges: OT General Charges $OT Visit: 1 Visit OT Treatments $Self Care/Home Management : 8-22 mins  Timothy Juarez, OTR/L [place]

## 2021-07-22 NOTE — TOC Transition Note (Signed)
Transition of Care [facility]) - CM/SW Discharge Note   Patient Details  Name: Timothy Juarez MRN: [id number] Date of Birth: 06-Feb-1951  Transition of Care [facility]) CM/SW Contact:  Beverly Sessions, RN Phone Number: 07/22/2021, 3:07 PM   Clinical Narrative:    Patient to return to [place] left for daughter   Per MD patient ready for DC to . RN, patient, patient's family, and facility notified of DC. Discharge Summary sent to facility. RN given number for report. DC packet on chart. Ambulance transport requested for patient.  TOC signing off.  Isaias Cowman [facility] [phone]       Barriers to Discharge: Continued Medical Work up   Patient Goals and CMS Choice        Discharge Placement                       Discharge Plan and Services                                     Social Determinants of Health (SDOH) Interventions     Readmission Risk Interventions No flowsheet data found.

## 2021-09-09 ENCOUNTER — Ambulatory Visit (INDEPENDENT_AMBULATORY_CARE_PROVIDER_SITE_OTHER): Payer: Medicare Other | Admitting: Nurse Practitioner

## 2021-09-09 ENCOUNTER — Other Ambulatory Visit: Payer: Self-pay

## 2021-09-09 ENCOUNTER — Encounter (INDEPENDENT_AMBULATORY_CARE_PROVIDER_SITE_OTHER): Payer: Self-pay | Admitting: Nurse Practitioner

## 2021-09-09 VITALS — BP 121/66 | HR 72 | Resp 16

## 2021-09-09 DIAGNOSIS — Z89611 Acquired absence of right leg above knee: Secondary | ICD-10-CM

## 2021-09-09 MED ORDER — SULFAMETHOXAZOLE-TRIMETHOPRIM 800-160 MG PO TABS: 1.0000 | ORAL_TABLET | Freq: Two times a day (BID) | ORAL | 0 refills | Status: DC

## 2021-09-09 NOTE — Progress Notes (Signed)
Subjective:    Patient ID: Timothy Juarez, male    DOB: 08/25/1950, 71 y.o.   MRN: [id number] Chief Complaint  Patient presents with   Routine Post [facility] post op follow up    Timothy Juarez is a 71 year old male that presents today for evaluation of his right above-knee amputation.  The amputation was done on 07/18/2021.  Due to some scheduling miscommunications Next follow-up.  The patient still has staples in place.  He does endorse having drainage from the area and it seems to be excessive.  He does note however that he is not in pain and the drainage is not foul-smelling.   Review of Systems  Musculoskeletal:  Positive for gait problem.  Hematological:  Bruises/bleeds easily.  All other systems reviewed and are negative.     Objective:   Physical Exam Vitals reviewed.  HENT:     Head: Normocephalic.  Cardiovascular:     Rate and Rhythm: Normal rate.  Pulmonary:     Effort: Pulmonary effort is normal.  Musculoskeletal:     Right Lower Extremity: Right leg is amputated above knee.     Left Lower Extremity: Left leg is amputated above knee.  Skin:    General: Skin is warm and dry.  Neurological:     Mental Status: He is alert and oriented to person, place, and time.  Psychiatric:        Mood and Affect: Mood normal.        Behavior: Behavior normal.        Thought Content: Thought content normal.        Judgment: Judgment normal.    BP 121/66 (BP Location: Right Arm)    Pulse 72    Resp 16   Past Medical History:  Diagnosis Date   Acute embolism and thombos unsp deep vn unsp lower extremity (HCC)    Acute respiratory failure (HCC)    AKI (acute kidney injury) ([place]) 12/27/2018   Allergy    Anemia    Aneurysm of unspecified site (HCC)    ARF (acute respiratory failure) (HCC)    H/O   Bronchitis    Cellulitis and abscess of leg 09/27/2019   CHF (congestive heart failure) (HCC)    COPD (chronic obstructive pulmonary disease) (HCC)    Cough     Epistaxis    Gastrointestinal hemorrhage    GERD (gastroesophageal reflux disease)    Gout    Hematemesis 04/27/2020   High anion gap metabolic acidosis 63/78/5885   Hyperlipidemia    Hypertension    Hypokalemia    Infection of above knee amputation stump ([place]) 09/27/2019   Insomnia    Iron deficiency anemia 04/21/2017   Muscle contracture    MUSCLE SPASMS, muscle weakness   Peripheral vascular disease (HCC)    Pneumonia    Pressure ulcer    Severe sepsis ([place]) 12/27/2018   Toxic encephalopathy 12/27/2018    Social History   Socioeconomic History   Marital status: Legally Separated    Spouse name: Not on file   Number of children: Not on file   Years of education: Not on file   Highest education level: Not on file  Occupational History   Not on file  Tobacco Use   Smoking status: Former    Packs/day: 1.00    Years: 44.00    Pack years: 44.00    Types: Cigarettes    Quit date: 11/17/2012    Years since quitting: 8.8  Smokeless tobacco: Never  Vaping Use   Vaping Use: Never used  Substance and Sexual Activity   Alcohol use: Not Currently   Drug use: Never   Sexual activity: Never  Other Topics Concern   Not on file  Social History Narrative   Not on file   Social Determinants of Health   Financial Resource Strain: Not on file  Food Insecurity: Not on file  Transportation Needs: Not on file  Physical Activity: Not on file  Stress: Not on file  Social Connections: Not on file  Intimate Partner Violence: Not on file    Past Surgical History:  Procedure Laterality Date   AMPUTATION Left 09/19/2015   Procedure: AMPUTATION BELOW KNEE;  Surgeon: Algernon Huxley, MD;  Location: [facility] ORS;  Service: Vascular;  Laterality: Left;   AMPUTATION Left 11/01/2015   Procedure: AMPUTATION ABOVE KNEE;  Surgeon: Algernon Huxley, MD;  Location: [facility];  Service: Vascular;  Laterality: Left;   AMPUTATION Left 09/29/2019   Procedure: IRRIGATION AND DEBRIDEMENT OF LEFT AKA;  Surgeon:  Algernon Huxley, MD;  Location: [place] [facility];  Service: General;  Laterality: Left;   AMPUTATION Right 07/18/2021   Procedure: AMPUTATION ABOVE KNEE;  Surgeon: Algernon Huxley, MD;  Location: [facility];  Service: Vascular;  Laterality: Right;   APPLICATION OF WOUND VAC Left 10/18/2015   Procedure: APPLICATION OF WOUND VAC;  Surgeon: Algernon Huxley, MD;  Location: [facility];  Service: Vascular;  Laterality: Left;   APPLICATION OF WOUND VAC Left 09/30/2019   Procedure: APPLICATION OF WOUND VAC;  Surgeon: Katha Cabal, MD;  Location: [facility];  Service: Vascular;  Laterality: Left;  serial # [id number]   [id number] CATHETERIZATION     CATARACT EXTRACTION W/PHACO Right 08/18/2019   Procedure: CATARACT EXTRACTION PHACO AND INTRAOCULAR LENS PLACEMENT (Central) RIGHT;  Surgeon: Birder Robson, MD;  Location: [facility];  Service: Ophthalmology;  Laterality: Right;  [place] 03:29.0 CDE 45.68 Fluid Pack Lot # [id number]   CENTRAL VENOUS CATHETER INSERTION Right 09/30/2019   Procedure: INSERTION CENTRAL LINE ADULT;  Surgeon: Katha Cabal, MD;  Location: [facility];  Service: Vascular;  Laterality: Right;   COLONOSCOPY WITH PROPOFOL N/A 05/25/2017   Procedure: COLONOSCOPY WITH PROPOFOL;  Surgeon: Jonathon Bellows, MD;  Location: [facility] ENDOSCOPY;  Service: Gastroenterology;  Laterality: N/A;   COLONOSCOPY WITH PROPOFOL N/A 10/14/2017   Procedure: COLONOSCOPY WITH PROPOFOL;  Surgeon: Jonathon Bellows, MD;  Location: [facility] ENDOSCOPY;  Service: Gastroenterology;  Laterality: N/A;   ESOPHAGOGASTRODUODENOSCOPY (EGD) WITH PROPOFOL N/A 05/25/2017   Procedure: ESOPHAGOGASTRODUODENOSCOPY (EGD) WITH PROPOFOL;  Surgeon: Jonathon Bellows, MD;  Location: [facility] ENDOSCOPY;  Service: Gastroenterology;  Laterality: N/A;   ESOPHAGOGASTRODUODENOSCOPY (EGD) WITH PROPOFOL N/A 10/14/2017   Procedure: ESOPHAGOGASTRODUODENOSCOPY (EGD) WITH PROPOFOL;  Surgeon: Jonathon Bellows, MD;  Location: [facility] ENDOSCOPY;  Service: Gastroenterology;  Laterality: N/A;    ESOPHAGOGASTRODUODENOSCOPY (EGD) WITH PROPOFOL N/A 04/27/2020   Procedure: ESOPHAGOGASTRODUODENOSCOPY (EGD) WITH PROPOFOL;  Surgeon: Lin Landsman, MD;  Location: [facility] ENDOSCOPY;  Service: Gastroenterology;  Laterality: N/A;   ESOPHAGOGASTRODUODENOSCOPY (EGD) WITH PROPOFOL N/A 06/18/2020   Procedure: ESOPHAGOGASTRODUODENOSCOPY (EGD) WITH PROPOFOL;  Surgeon: Lin Landsman, MD;  Location: [facility] ENDOSCOPY;  Service: Gastroenterology;  Laterality: N/A;   EYE SURGERY     GIVENS CAPSULE STUDY N/A 07/14/2017   Procedure: GIVENS CAPSULE STUDY;  Surgeon: Jonathon Bellows, MD;  Location: [facility] ENDOSCOPY;  Service: Gastroenterology;  Laterality: N/A;   GIVENS CAPSULE STUDY N/A 10/30/2017   Procedure: GIVENS CAPSULE STUDY 12 HR;  Surgeon:  Jonathon Bellows, MD;  Location: [facility] ENDOSCOPY;  Service: Gastroenterology;  Laterality: N/A;   IVC FILTER INSERTION N/A 10/12/2017   Procedure: IVC FILTER INSERTION;  Surgeon: Algernon Huxley, MD;  Location: [place] CV LAB;  Service: Cardiovascular;  Laterality: N/A;   LOWER EXTREMITY ANGIOGRAPHY Right 10/04/2018   Procedure: LOWER EXTREMITY ANGIOGRAPHY;  Surgeon: Algernon Huxley, MD;  Location: [place] CV LAB;  Service: Cardiovascular;  Laterality: Right;   LOWER EXTREMITY ANGIOGRAPHY Right 03/21/2021   Procedure: LOWER EXTREMITY ANGIOGRAPHY;  Surgeon: Algernon Huxley, MD;  Location: [place] CV LAB;  Service: Cardiovascular;  Laterality: Right;   LOWER EXTREMITY ANGIOGRAPHY Right 05/23/2021   Procedure: LOWER EXTREMITY ANGIOGRAPHY;  Surgeon: Algernon Huxley, MD;  Location: [place] CV LAB;  Service: Cardiovascular;  Laterality: Right;   PERIPHERAL VASCULAR CATHETERIZATION Left 01/18/2015   Procedure: Lower Extremity Angiography;  Surgeon: Algernon Huxley, MD;  Location: [place] CV LAB;  Service: Cardiovascular;  Laterality: Left;   PERIPHERAL VASCULAR CATHETERIZATION N/A 01/18/2015   Procedure: Lower Extremity Intervention;  Surgeon: Algernon Huxley, MD;  Location: [place] CV LAB;  Service: Cardiovascular;  Laterality: N/A;   PERIPHERAL VASCULAR CATHETERIZATION  07/30/2015   Procedure: Lower Extremity Intervention;  Surgeon: Algernon Huxley, MD;  Location: [place] CV LAB;  Service: Cardiovascular;;   PERIPHERAL VASCULAR CATHETERIZATION N/A 07/30/2015   Procedure: Abdominal Aortogram w/Lower Extremity;  Surgeon: Algernon Huxley, MD;  Location: [place] CV LAB;  Service: Cardiovascular;  Laterality: N/A;   PERIPHERAL VASCULAR CATHETERIZATION Left 08/22/2015   Procedure: Lower Extremity Angiography;  Surgeon: Algernon Huxley, MD;  Location: [place] CV LAB;  Service: Cardiovascular;  Laterality: Left;   PERIPHERAL VASCULAR CATHETERIZATION Left 08/22/2015   Procedure: Lower Extremity Intervention;  Surgeon: Algernon Huxley, MD;  Location: [place] CV LAB;  Service: Cardiovascular;  Laterality: Left;   PERIPHERAL VASCULAR CATHETERIZATION Right 03/31/2016   Procedure: Lower Extremity Angiography;  Surgeon: Algernon Huxley, MD;  Location: [place] CV LAB;  Service: Cardiovascular;  Laterality: Right;   PERIPHERAL VASCULAR CATHETERIZATION  03/31/2016   Procedure: Lower Extremity Intervention;  Surgeon: Algernon Huxley, MD;  Location: [place] CV LAB;  Service: Cardiovascular;;   PERIPHERAL VASCULAR CATHETERIZATION Left 04/10/2016   Procedure: Lower Extremity Angiography;  Surgeon: Algernon Huxley, MD;  Location: [place] CV LAB;  Service: Cardiovascular;  Laterality: Left;   VACUUM ASSISTED CLOSURE CHANGE Left 10/03/2019   Procedure: LEFT THIGH VACUUM ASSISTED CLOSURE CHANGE;  Surgeon: Algernon Huxley, MD;  Location: [facility] ORS;  Service: General;  Laterality: Left;   WOUND DEBRIDEMENT Left 10/18/2015   Procedure: DEBRIDEMENT WOUND   ( LEFT BKA DEBRIDEMENT );  Surgeon: Algernon Huxley, MD;  Location: [facility] ORS;  Service: Vascular;  Laterality: Left;    Family History  Problem Relation Age of Onset   Heart attack Mother    Varicose Veins Neg Hx     No Known  Allergies  CBC Latest Ref Rng & Units 07/22/2021 07/20/2021 07/19/2021  WBC 4.0 - 10.5 K/uL 17.4(H) 16.3(H) 19.2(H)  Hemoglobin 13.0 - 17.0 g/dL 10.8(L) 8.7(L) 9.4(L)  Hematocrit 39.0 - 52.0 % 34.9(L) 27.8(L) 29.5(L)  Platelets 150 - 400 K/uL 443(H) 284 340      CMP     Component Value Date/Time   NA 139 07/22/2021 1131   NA 136 12/01/2013 1317   K 4.5 07/22/2021 1131   K 3.9 12/01/2013 1317   CL 106 07/22/2021 1131  CL 100 12/01/2013 1317   CO2 27 07/22/2021 1131   CO2 33 (H) 12/01/2013 1317   GLUCOSE 127 (H) 07/22/2021 1131   GLUCOSE 91 12/01/2013 1317   BUN 15 07/22/2021 1131   BUN 24 (H) 09/25/2014 0846   CREATININE 1.09 07/22/2021 1131   CREATININE 1.29 09/25/2014 0846   CALCIUM 8.8 (L) 07/22/2021 1131   CALCIUM 9.4 12/01/2013 1317   PROT 7.9 06/14/2021 1139   PROT 6.5 03/25/2013 0351   ALBUMIN 3.9 06/14/2021 1139   ALBUMIN 1.2 (L) 03/25/2013 0351   AST 15 06/14/2021 1139   AST 45 (H) 03/25/2013 0351   ALT 11 06/14/2021 1139   ALT 35 03/25/2013 0351   ALKPHOS 116 06/14/2021 1139   ALKPHOS 71 03/25/2013 0351   BILITOT 0.4 06/14/2021 1139   BILITOT 0.3 03/25/2013 0351   GFRNONAA >60 07/22/2021 1131   GFRNONAA 60 (L) 09/25/2014 0846   GFRNONAA >60 03/16/2014 0750   GFRAA >60 04/30/2020 0650   GFRAA >60 09/25/2014 0846   GFRAA >60 03/16/2014 0750     No results found.     Assessment & Plan:   1. S/P AKA (above knee amputation), right (HCC) Staples removed today patient tolerated relatively well.  The patient is on anticoagulation so he did have some bleeding post staple removal however it was easily stopped with pressure.  We will also prescribe antibiotic surgery there is no underlying infection.  Drainage but this may have been due to the presence of the staples.  We will have the patient return in 4 weeks to reevaluate his wound.   Current Outpatient Medications on File Prior to Visit  Medication Sig Dispense Refill   acetaminophen (TYLENOL) 325 MG  tablet Take 650 mg by mouth every 4 (four) hours as needed for fever.      acidophilus (RISAQUAD) CAPS capsule Take 1 capsule by mouth 2 (two) times daily.     apixaban (ELIQUIS) 2.5 MG TABS tablet Take 1 tablet (2.5 mg total) by mouth 2 (two) times daily. 60 tablet 3   ascorbic acid (VITAMIN C) 500 MG tablet Take 500 mg by mouth 2 (two) times daily.     aspirin EC 81 MG EC tablet Take 1 tablet (81 mg total) by mouth daily. Swallow whole. 90 tablet 3   atorvastatin (LIPITOR) 10 MG tablet Take 1 tablet (10 mg total) by mouth daily. 30 tablet 11   baclofen (LIORESAL) 10 MG tablet Take 10 mg by mouth 3 (three) times daily. Hold for sedation     brimonidine-timolol (COMBIGAN) 0.2-0.5 % ophthalmic solution Place 1 drop into the right eye 2 (two) times daily.     docusate sodium (COLACE) 100 MG capsule Take 100 mg by mouth daily as needed for mild constipation.     fentaNYL (DURAGESIC) 25 MCG/HR Place 1 patch onto the skin every 3 (three) days.      ferrous sulfate 324 (65 Fe) MG TBEC Take 324 mg by mouth 2 (two) times daily.      Fluticasone-Umeclidin-Vilant 100-62.5-25 MCG/ACT AEPB Inhale 1 puff into the lungs every morning.      furosemide (LASIX) 40 MG tablet Take 40 mg by mouth daily.     gabapentin (NEURONTIN) 100 MG capsule Take 100 mg by mouth at bedtime.      guaiFENesin (ROBITUSSIN) 100 MG/5ML SOLN Take 15 mLs by mouth 3 (three) times daily as needed for cough (congestion).     ketorolac (ACULAR) 0.5 % ophthalmic solution Place 1 drop into the  left eye 2 (two) times daily.     loperamide (IMODIUM) 2 MG capsule Take 2 mg by mouth 3 (three) times daily as needed for diarrhea or loose stools.     nitroGLYCERIN (NITROSTAT) 0.4 MG SL tablet Place 0.14 mg under the tongue every 5 (five) minutes as needed for chest pain.     Omega-3 Fatty Acids (FISH OIL) 1000 MG CAPS Take 1,000 mg by mouth daily.      Oxycodone HCl 10 MG TABS Take 10 mg by mouth every 4 (four) hours as needed (severe pain).      oxymetazoline (AFRIN) 0.05 % nasal spray Place 2 sprays into both nostrils 2 (two) times daily as needed (For nose bleeds).     pantoprazole (PROTONIX) 40 MG tablet Take 1 tablet (40 mg total) by mouth 2 (two) times daily. 60 tablet 0   potassium chloride SA (K-DUR,KLOR-CON) 20 MEQ tablet Take 20 mEq by mouth daily.     sodium chloride (OCEAN) 0.65 % SOLN nasal spray Place 2 sprays into both nostrils 2 (two) times daily as needed for congestion.     tamsulosin (FLOMAX) 0.4 MG CAPS capsule Take 1 capsule (0.4 mg total) by mouth daily. 30 capsule 0   No current facility-administered medications on file prior to visit.    There are no Patient Instructions on file for this visit. No follow-ups on file.   Kris Hartmann, NP

## 2021-09-19 ENCOUNTER — Other Ambulatory Visit (INDEPENDENT_AMBULATORY_CARE_PROVIDER_SITE_OTHER): Payer: Self-pay | Admitting: Nurse Practitioner

## 2021-09-19 DIAGNOSIS — Z9889 Other specified postprocedural states: Secondary | ICD-10-CM

## 2021-09-19 DIAGNOSIS — I739 Peripheral vascular disease, unspecified: Secondary | ICD-10-CM

## 2021-09-20 ENCOUNTER — Other Ambulatory Visit: Payer: Self-pay

## 2021-09-20 ENCOUNTER — Encounter (INDEPENDENT_AMBULATORY_CARE_PROVIDER_SITE_OTHER): Payer: Self-pay | Admitting: Vascular Surgery

## 2021-09-20 ENCOUNTER — Encounter (INDEPENDENT_AMBULATORY_CARE_PROVIDER_SITE_OTHER): Payer: Self-pay

## 2021-09-20 ENCOUNTER — Ambulatory Visit (INDEPENDENT_AMBULATORY_CARE_PROVIDER_SITE_OTHER): Payer: Medicare Other | Admitting: Vascular Surgery

## 2021-09-20 ENCOUNTER — Ambulatory Visit (INDEPENDENT_AMBULATORY_CARE_PROVIDER_SITE_OTHER): Payer: Medicare Other

## 2021-09-20 VITALS — BP 118/63 | HR 74 | Resp 16

## 2021-09-20 DIAGNOSIS — Z89612 Acquired absence of left leg above knee: Secondary | ICD-10-CM

## 2021-09-20 DIAGNOSIS — I70221 Atherosclerosis of native arteries of extremities with rest pain, right leg: Secondary | ICD-10-CM

## 2021-09-20 DIAGNOSIS — I10 Essential (primary) hypertension: Secondary | ICD-10-CM

## 2021-09-20 NOTE — Progress Notes (Signed)
Patient ID: Timothy Juarez, male   DOB: 1951-06-23, 71 y.o.   MRN: [id number]  Chief Complaint  Patient presents with   Follow-up    3 month ultrasound follow up    HPI Timothy Juarez is a 71 y.o. male.  Patient returns in follow-up of his amputation.  He is about 2 months status post right above-knee amputation.  He is several years status post left above-knee amputation.  He is doing well today.  He has no specific complaints.  He had pain for a couple of weeks after the amputation but this has passed.  His wound has healed.   Past Medical History:  Diagnosis Date   Acute embolism and thombos unsp deep vn unsp lower extremity (HCC)    Acute respiratory failure (HCC)    AKI (acute kidney injury) ([place]) 12/27/2018   Allergy    Anemia    Aneurysm of unspecified site ([place])    ARF (acute respiratory failure) (HCC)    H/O   Bronchitis    Cellulitis and abscess of leg 09/27/2019   CHF (congestive heart failure) (HCC)    COPD (chronic obstructive pulmonary disease) (HCC)    Cough    Epistaxis    Gastrointestinal hemorrhage    GERD (gastroesophageal reflux disease)    Gout    Hematemesis 04/27/2020   High anion gap metabolic acidosis 35/32/9924   Hyperlipidemia    Hypertension    Hypokalemia    Infection of above knee amputation stump ([place]) 09/27/2019   Insomnia    Iron deficiency anemia 04/21/2017   Muscle contracture    MUSCLE SPASMS, muscle weakness   Peripheral vascular disease (HCC)    Pneumonia    Pressure ulcer    Severe sepsis ([place]) 12/27/2018   Toxic encephalopathy 12/27/2018    Past Surgical History:  Procedure Laterality Date   AMPUTATION Left 09/19/2015   Procedure: AMPUTATION BELOW KNEE;  Surgeon: Algernon Huxley, MD;  Location: [facility];  Service: Vascular;  Laterality: Left;   AMPUTATION Left 11/01/2015   Procedure: AMPUTATION ABOVE KNEE;  Surgeon: Algernon Huxley, MD;  Location: [facility];  Service: Vascular;  Laterality: Left;   AMPUTATION Left 09/29/2019    Procedure: IRRIGATION AND DEBRIDEMENT OF LEFT AKA;  Surgeon: Algernon Huxley, MD;  Location: [place] [facility];  Service: General;  Laterality: Left;   AMPUTATION Right 07/18/2021   Procedure: AMPUTATION ABOVE KNEE;  Surgeon: Algernon Huxley, MD;  Location: [facility];  Service: Vascular;  Laterality: Right;   APPLICATION OF WOUND VAC Left 10/18/2015   Procedure: APPLICATION OF WOUND VAC;  Surgeon: Algernon Huxley, MD;  Location: [facility];  Service: Vascular;  Laterality: Left;   APPLICATION OF WOUND VAC Left 09/30/2019   Procedure: APPLICATION OF WOUND VAC;  Surgeon: Katha Cabal, MD;  Location: [facility];  Service: Vascular;  Laterality: Left;  serial # [id number]   [id number] CATHETERIZATION     CATARACT EXTRACTION W/PHACO Right 08/18/2019   Procedure: CATARACT EXTRACTION PHACO AND INTRAOCULAR LENS PLACEMENT ([place]) RIGHT;  Surgeon: Birder Robson, MD;  Location: [facility];  Service: Ophthalmology;  Laterality: Right;  [place] 03:29.0 CDE 45.68 Fluid Pack Lot # [id number]   CENTRAL VENOUS CATHETER INSERTION Right 09/30/2019   Procedure: INSERTION CENTRAL LINE ADULT;  Surgeon: Katha Cabal, MD;  Location: [facility];  Service: Vascular;  Laterality: Right;   COLONOSCOPY WITH PROPOFOL N/A 05/25/2017   Procedure: COLONOSCOPY WITH PROPOFOL;  Surgeon: Jonathon Bellows, MD;  Location: [facility] ENDOSCOPY;  Service: Gastroenterology;  Laterality: N/A;   COLONOSCOPY WITH PROPOFOL N/A 10/14/2017   Procedure: COLONOSCOPY WITH PROPOFOL;  Surgeon: Jonathon Bellows, MD;  [facility] END[facility]COPY;  Service: Gastroenterology;  Laterality: N/A;   ESOPHAGOGASTRODUODENOSCOPY (EGD) WITH PROPOFOL N/A 05/25/2017   Procedure: ESOPHAGOGASTRODUODENOSCOPY (EGD) WITH PROPOFOL;  Surgeon: Jonathon Bellows, MD;  Location: [facility] ENDOSCOPY;  Service: Gastroenterology;  Laterality: N/A;   ESOPHAGOGASTRODUODENOSCOPY (EGD) WITH PROPOFOL N/A 10/14/2017   Procedure: ESOPHAGOGASTRODUODENOSCOPY (EGD) WITH PROPOFOL;  Surgeon: Jonathon Bellows, MD;  Location: [facility] ENDOSCOPY;  Service:  Gastroenterology;  Laterality: N/A;   ESOPHAGOGASTRODUODENOSCOPY (EGD) WITH PROPOFOL N/A 04/27/2020   Procedure: ESOPHAGOGASTRODUODENOSCOPY (EGD) WITH PROPOFOL;  Surgeon: Lin Landsman, MD;  Location: [place] ENDOSCOPY;  Service: Gastroenterology;  Laterality: N/A;   ESOPHAGOGASTRODUODENOSCOPY (EGD) WITH PROPOFOL N/A 06/18/2020   Procedure: ESOPHAGOGASTRODUODENOSCOPY (EGD) WITH PROPOFOL;  Surgeon: Lin Landsman, MD;  Location: [facility] ENDOSCOPY;  Service: Gastroenterology;  Laterality: N/A;   EYE SURGERY     GIVENS CAPSULE STUDY N/A 07/14/2017   Procedure: GIVENS CAPSULE STUDY;  Surgeon: Jonathon Bellows, MD;  Location: [facility] ENDOSCOPY;  Service: Gastroenterology;  Laterality: N/A;   GIVENS CAPSULE STUDY N/A 10/30/2017   Procedure: GIVENS CAPSULE STUDY 12 HR;  Surgeon: Jonathon Bellows, MD;  Location: [facility] ENDOSCOPY;  Service: Gastroenterology;  Laterality: N/A;   IVC FILTER INSERTION N/A 10/12/2017   Procedure: IVC FILTER INSERTION;  Surgeon: Algernon Huxley, MD;  Location: [place] CV LAB;  Service: Cardiovascular;  Laterality: N/A;   LOWER EXTREMITY ANGIOGRAPHY Right 10/04/2018   Procedure: LOWER EXTREMITY ANGIOGRAPHY;  Surgeon: Algernon Huxley, MD;  Location: [place] CV LAB;  Service: Cardiovascular;  Laterality: Right;   LOWER EXTREMITY ANGIOGRAPHY Right 03/21/2021   Procedure: LOWER EXTREMITY ANGIOGRAPHY;  Surgeon: Algernon Huxley, MD;  Location: [place] CV LAB;  Service: Cardiovascular;  Laterality: Right;   LOWER EXTREMITY ANGIOGRAPHY Right 05/23/2021   Procedure: LOWER EXTREMITY ANGIOGRAPHY;  Surgeon: Algernon Huxley, MD;  Location: [place] CV LAB;  Service: Cardiovascular;  Laterality: Right;   PERIPHERAL VASCULAR CATHETERIZATION Left 01/18/2015   Procedure: Lower Extremity Angiography;  Surgeon: Algernon Huxley, MD;  Location: [place] CV LAB;  Service: Cardiovascular;  Laterality: Left;   PERIPHERAL VASCULAR CATHETERIZATION N/A 01/18/2015   Procedure: Lower Extremity Intervention;   Surgeon: Algernon Huxley, MD;  Location: [place] CV LAB;  Service: Cardiovascular;  Laterality: N/A;   PERIPHERAL VASCULAR CATHETERIZATION  07/30/2015   Procedure: Lower Extremity Intervention;  Surgeon: Algernon Huxley, MD;  Location: [place] CV LAB;  Service: Cardiovascular;;   PERIPHERAL VASCULAR CATHETERIZATION N/A 07/30/2015   Procedure: Abdominal Aortogram w/Lower Extremity;  Surgeon: Algernon Huxley, MD;  Location: [place] CV LAB;  Service: Cardiovascular;  Laterality: N/A;   PERIPHERAL VASCULAR CATHETERIZATION Left 08/22/2015   Procedure: Lower Extremity Angiography;  Surgeon: Algernon Huxley, MD;  Location: [place] CV LAB;  Service: Cardiovascular;  Laterality: Left;   PERIPHERAL VASCULAR CATHETERIZATION Left 08/22/2015   Procedure: Lower Extremity Intervention;  Surgeon: Algernon Huxley, MD;  Location: [place] CV LAB;  Service: Cardiovascular;  Laterality: Left;   PERIPHERAL VASCULAR CATHETERIZATION Right 03/31/2016   Procedure: Lower Extremity Angiography;  Surgeon: Algernon Huxley, MD;  Location: [place] CV LAB;  Service: Cardiovascular;  Laterality: Right;   PERIPHERAL VASCULAR CATHETERIZATION  03/31/2016   Procedure: Lower Extremity Intervention;  Surgeon: Algernon Huxley, MD;  Location: [place] CV LAB;  Service: Cardiovascular;;   PERIPHERAL VASCULAR CATHETERIZATION Left 04/10/2016   Procedure: Lower Extremity  Angiography;  Surgeon: Algernon Huxley, MD;  Location: [place] CV LAB;  Service: Cardiovascular;  Laterality: Left;   VACUUM ASSISTED CLOSURE CHANGE Left 10/03/2019   Procedure: LEFT THIGH VACUUM ASSISTED CLOSURE CHANGE;  Surgeon: Algernon Huxley, MD;  Location: [facility];  Service: General;  Laterality: Left;   WOUND DEBRIDEMENT Left 10/18/2015   Procedure: DEBRIDEMENT WOUND   ( LEFT BKA DEBRIDEMENT );  Surgeon: Algernon Huxley, MD;  Location: [facility];  Service: Vascular;  Laterality: Left;      No Known Allergies  Current Outpatient Medications  Medication Sig Dispense  Refill   acetaminophen (TYLENOL) 325 MG tablet Take 650 mg by mouth every 4 (four) hours as needed for fever.      acidophilus (RISAQUAD) CAPS capsule Take 1 capsule by mouth 2 (two) times daily.     apixaban (ELIQUIS) 2.5 MG TABS tablet Take 1 tablet (2.5 mg total) by mouth 2 (two) times daily. 60 tablet 3   ascorbic acid (VITAMIN C) 500 MG tablet Take 500 mg by mouth 2 (two) times daily.     aspirin EC 81 MG EC tablet Take 1 tablet (81 mg total) by mouth daily. Swallow whole. 90 tablet 3   atorvastatin (LIPITOR) 10 MG tablet Take 1 tablet (10 mg total) by mouth daily. 30 tablet 11   baclofen (LIORESAL) 10 MG tablet Take 10 mg by mouth 3 (three) times daily. Hold for sedation     brimonidine-timolol (COMBIGAN) 0.2-0.5 % ophthalmic solution Place 1 drop into the right eye 2 (two) times daily.     docusate sodium (COLACE) 100 MG capsule Take 100 mg by mouth daily as needed for mild constipation.     fentaNYL (DURAGESIC) 25 MCG/HR Place 1 patch onto the skin every 3 (three) days.      ferrous sulfate 324 (65 Fe) MG TBEC Take 324 mg by mouth 2 (two) times daily.      Fluticasone-Umeclidin-Vilant 100-62.5-25 MCG/ACT AEPB Inhale 1 puff into the lungs every morning.      furosemide (LASIX) 40 MG tablet Take 40 mg by mouth daily.     gabapentin (NEURONTIN) 100 MG capsule Take 100 mg by mouth at bedtime.      guaiFENesin (ROBITUSSIN) 100 MG/5ML SOLN Take 15 mLs by mouth 3 (three) times daily as needed for cough (congestion).     ketorolac (ACULAR) 0.5 % ophthalmic solution Place 1 drop into the left eye 2 (two) times daily.     loperamide (IMODIUM) 2 MG capsule Take 2 mg by mouth 3 (three) times daily as needed for diarrhea or loose stools.     nitroGLYCERIN (NITROSTAT) 0.4 MG SL tablet Place 0.14 mg under the tongue every 5 (five) minutes as needed for chest pain.     Omega-3 Fatty Acids (FISH OIL) 1000 MG CAPS Take 1,000 mg by mouth daily.      Oxycodone HCl 10 MG TABS Take 10 mg by mouth every 4  (four) hours as needed (severe pain).     oxymetazoline (AFRIN) 0.05 % nasal spray Place 2 sprays into both nostrils 2 (two) times daily as needed (For nose bleeds).     pantoprazole (PROTONIX) 40 MG tablet Take 1 tablet (40 mg total) by mouth 2 (two) times daily. 60 tablet 0   potassium chloride SA (K-DUR,KLOR-CON) 20 MEQ tablet Take 20 mEq by mouth daily.     sodium chloride (OCEAN) 0.65 % SOLN nasal spray Place 2 sprays into both nostrils 2 (two) times daily  as needed for congestion.     sulfamethoxazole-trimethoprim (BACTRIM DS) 800-160 MG tablet Take 1 tablet by mouth 2 (two) times daily. 20 tablet 0   tamsulosin (FLOMAX) 0.4 MG CAPS capsule Take 1 capsule (0.4 mg total) by mouth daily. 30 capsule 0   No current facility-administered medications for this visit.        Physical Exam BP 118/63 (BP Location: Right Arm)    Pulse 74    Resp 16  Gen:  WD/WN, NAD Skin: incision C/D/I     Assessment/Plan:  Atherosclerosis of native arteries of extremity with rest pain (HCC) Now status post bilateral above-knee amputations.  These are healed.  He is doing well.  We will have him come back in a few months in follow-up and then can likely go to a once a year follow-up.  Essential hypertension blood pressure control important in reducing the progression of atherosclerotic disease. On appropriate oral medications.   Hx of AKA (above knee amputation), left ([place]) Now bilateral and healed.      [name] 09/20/2021, 9:29 AM   This note was created with Dragon medical transcription system.  Any errors from dictation are unintentional.

## 2021-09-20 NOTE — Assessment & Plan Note (Signed)
Now status post bilateral above-knee amputations.  These are healed.  He is doing well.  We will have him come back in a few months in follow-up and then can likely go to a once a year follow-up.

## 2021-09-20 NOTE — Assessment & Plan Note (Signed)
blood pressure control important in reducing the progression of atherosclerotic disease. On appropriate oral medications.  

## 2021-09-20 NOTE — Assessment & Plan Note (Signed)
Now bilateral and healed.

## 2021-10-07 ENCOUNTER — Other Ambulatory Visit: Payer: Self-pay

## 2021-10-07 ENCOUNTER — Encounter: Payer: Self-pay | Admitting: Oncology

## 2021-10-07 ENCOUNTER — Ambulatory Visit (INDEPENDENT_AMBULATORY_CARE_PROVIDER_SITE_OTHER): Payer: Medicare Other | Admitting: Vascular Surgery

## 2021-10-07 VITALS — BP 125/68 | HR 62 | Ht 73.0 in | Wt 224.0 lb

## 2021-10-07 DIAGNOSIS — I998 Other disorder of circulatory system: Secondary | ICD-10-CM

## 2021-10-09 ENCOUNTER — Encounter (INDEPENDENT_AMBULATORY_CARE_PROVIDER_SITE_OTHER): Payer: Self-pay | Admitting: Vascular Surgery

## 2021-10-09 NOTE — Progress Notes (Signed)
Patient ID: Timothy Juarez, male   DOB: 27-Nov-1950, 71 y.o.   MRN: [id number]  Chief Complaint  Patient presents with   Follow-up    4 wk follow up    HPI Timothy Juarez is a 71 y.o. male.  Patient is status post right above-knee amputation on July 18, 2021.  He reports today no pain at the surgical site.  When questioned as to whether he wishes to move forward with bilateral above-knee prostheses he said no quite adamantly.   Past Medical History:  Diagnosis Date   Acute embolism and thombos unsp deep vn unsp lower extremity (HCC)    Acute respiratory failure (HCC)    AKI (acute kidney injury) ([place]) 12/27/2018   Allergy    Anemia    Aneurysm of unspecified site ([place])    ARF (acute respiratory failure) (HCC)    H/O   Bronchitis    Cellulitis and abscess of leg 09/27/2019   CHF (congestive heart failure) (HCC)    COPD (chronic obstructive pulmonary disease) (HCC)    Cough    Epistaxis    Gastrointestinal hemorrhage    GERD (gastroesophageal reflux disease)    Gout    Hematemesis 04/27/2020   High anion gap metabolic acidosis 12/75/1700   Hyperlipidemia    Hypertension    Hypokalemia    Infection of above knee amputation stump ([place]) 09/27/2019   Insomnia    Iron deficiency anemia 04/21/2017   Muscle contracture    MUSCLE SPASMS, muscle weakness   Peripheral vascular disease (HCC)    Pneumonia    Pressure ulcer    Severe sepsis ([place]) 12/27/2018   Toxic encephalopathy 12/27/2018    Past Surgical History:  Procedure Laterality Date   AMPUTATION Left 09/19/2015   Procedure: AMPUTATION BELOW KNEE;  Surgeon: Algernon Huxley, MD;  Location: [facility];  Service: Vascular;  Laterality: Left;   AMPUTATION Left 11/01/2015   Procedure: AMPUTATION ABOVE KNEE;  Surgeon: Algernon Huxley, MD;  Location: [facility];  Service: Vascular;  Laterality: Left;   AMPUTATION Left 09/29/2019   Procedure: IRRIGATION AND DEBRIDEMENT OF LEFT AKA;  Surgeon: Algernon Huxley, MD;  Location: [place]  ORS;  Service: General;  Laterality: Left;   AMPUTATION Right 07/18/2021   Procedure: AMPUTATION ABOVE KNEE;  Surgeon: Algernon Huxley, MD;  Location: [facility];  Service: Vascular;  Laterality: Right;   APPLICATION OF WOUND VAC Left 10/18/2015   Procedure: APPLICATION OF WOUND VAC;  Surgeon: Algernon Huxley, MD;  Location: [facility];  Service: Vascular;  Laterality: Left;   APPLICATION OF WOUND VAC Left 09/30/2019   Procedure: APPLICATION OF WOUND VAC;  Surgeon: Katha Cabal, MD;  Location: [facility];  Service: Vascular;  Laterality: Left;  serial # [id number]   [id number] CATHETERIZATION     CATARACT EXTRACTION W/PHACO Right 08/18/2019   Procedure: CATARACT EXTRACTION PHACO AND INTRAOCULAR LENS PLACEMENT ([place]) RIGHT;  Surgeon: Birder Robson, MD;  Location: [facility];  Service: Ophthalmology;  Laterality: Right;  [place] 03:29.0 CDE 45.68 Fluid Pack Lot # [id number]   CENTRAL VENOUS CATHETER INSERTION Right 09/30/2019   Procedure: INSERTION CENTRAL LINE ADULT;  Surgeon: Katha Cabal, MD;  Location: [facility];  Service: Vascular;  Laterality: Right;   COLONOSCOPY WITH PROPOFOL N/A 05/25/2017   Procedure: COLONOSCOPY WITH PROPOFOL;  Surgeon: Jonathon Bellows, MD;  Location: [facility] ENDOSCOPY;  Service: Gastroenterology;  Laterality: N/A;   COLONOSCOPY WITH PROPOFOL N/A 10/14/2017   Procedure: COLONOSCOPY WITH PROPOFOL;  Surgeon:  Jonathon Bellows, MD;  Location: [facility] ENDOSCOPY;  Service: Gastroenterology;  Laterality: N/A;   ESOPHAGOGASTRODUODENOSCOPY (EGD) WITH PROPOFOL N/A 05/25/2017   Procedure: ESOPHAGOGASTRODUODENOSCOPY (EGD) WITH PROPOFOL;  Surgeon: Jonathon Bellows, MD;  Location: [facility] ENDOSCOPY;  Service: Gastroenterology;  Laterality: N/A;   ESOPHAGOGASTRODUODENOSCOPY (EGD) WITH PROPOFOL N/A 10/14/2017   Procedure: ESOPHAGOGASTRODUODENOSCOPY (EGD) WITH PROPOFOL;  Surgeon: Jonathon Bellows, MD;  Location: [facility] ENDOSCOPY;  Service: Gastroenterology;  Laterality: N/A;   ESOPHAGOGASTRODUODENOSCOPY (EGD) WITH PROPOFOL N/A  04/27/2020   Procedure: ESOPHAGOGASTRODUODENOSCOPY (EGD) WITH PROPOFOL;  Surgeon: Lin Landsman, MD;  Location: [facility] ENDOSCOPY;  Service: Gastroenterology;  Laterality: N/A;   ESOPHAGOGASTRODUODENOSCOPY (EGD) WITH PROPOFOL N/A 06/18/2020   Procedure: ESOPHAGOGASTRODUODENOSCOPY (EGD) WITH PROPOFOL;  Surgeon: Lin Landsman, MD;  Location: [facility] ENDOSCOPY;  Service: Gastroenterology;  Laterality: N/A;   EYE SURGERY     GIVENS CAPSULE STUDY N/A 07/14/2017   Procedure: GIVENS CAPSULE STUDY;  Surgeon: Jonathon Bellows, MD;  Location: [facility] ENDOSCOPY;  Service: Gastroenterology;  Laterality: N/A;   GIVENS CAPSULE STUDY N/A 10/30/2017   Procedure: GIVENS CAPSULE STUDY 12 HR;  Surgeon: Jonathon Bellows, MD;  Location: [facility] ENDOSCOPY;  Service: Gastroenterology;  Laterality: N/A;   IVC FILTER INSERTION N/A 10/12/2017   Procedure: IVC FILTER INSERTION;  Surgeon: Algernon Huxley, MD;  Location: [place] CV LAB;  Service: Cardiovascular;  Laterality: N/A;   LOWER EXTREMITY ANGIOGRAPHY Right 10/04/2018   Procedure: LOWER EXTREMITY ANGIOGRAPHY;  Surgeon: Algernon Huxley, MD;  Location: [place] CV LAB;  Service: Cardiovascular;  Laterality: Right;   LOWER EXTREMITY ANGIOGRAPHY Right 03/21/2021   Procedure: LOWER EXTREMITY ANGIOGRAPHY;  Surgeon: Algernon Huxley, MD;  Location: [place] CV LAB;  Service: Cardiovascular;  Laterality: Right;   LOWER EXTREMITY ANGIOGRAPHY Right 05/23/2021   Procedure: LOWER EXTREMITY ANGIOGRAPHY;  Surgeon: Algernon Huxley, MD;  Location: [place] CV LAB;  Service: Cardiovascular;  Laterality: Right;   PERIPHERAL VASCULAR CATHETERIZATION Left 01/18/2015   Procedure: Lower Extremity Angiography;  Surgeon: Algernon Huxley, MD;  Location: [place] CV LAB;  Service: Cardiovascular;  Laterality: Left;   PERIPHERAL VASCULAR CATHETERIZATION N/A 01/18/2015   Procedure: Lower Extremity Intervention;  Surgeon: Algernon Huxley, MD;  Location: [place] CV LAB;  Service: Cardiovascular;   Laterality: N/A;   PERIPHERAL VASCULAR CATHETERIZATION  07/30/2015   Procedure: Lower Extremity Intervention;  Surgeon: Algernon Huxley, MD;  Location: [place] CV LAB;  Service: Cardiovascular;;   PERIPHERAL VASCULAR CATHETERIZATION N/A 07/30/2015   Procedure: Abdominal Aortogram w/Lower Extremity;  Surgeon: Algernon Huxley, MD;  Location: [place] CV LAB;  Service: Cardiovascular;  Laterality: N/A;   PERIPHERAL VASCULAR CATHETERIZATION Left 08/22/2015   Procedure: Lower Extremity Angiography;  Surgeon: Algernon Huxley, MD;  Location: [place] CV LAB;  Service: Cardiovascular;  Laterality: Left;   PERIPHERAL VASCULAR CATHETERIZATION Left 08/22/2015   Procedure: Lower Extremity Intervention;  Surgeon: Algernon Huxley, MD;  Location: [place] CV LAB;  Service: Cardiovascular;  Laterality: Left;   PERIPHERAL VASCULAR CATHETERIZATION Right 03/31/2016   Procedure: Lower Extremity Angiography;  Surgeon: Algernon Huxley, MD;  Location: [place] CV LAB;  Service: Cardiovascular;  Laterality: Right;   PERIPHERAL VASCULAR CATHETERIZATION  03/31/2016   Procedure: Lower Extremity Intervention;  Surgeon: Algernon Huxley, MD;  Location: [place] CV LAB;  Service: Cardiovascular;;   PERIPHERAL VASCULAR CATHETERIZATION Left 04/10/2016   Procedure: Lower Extremity Angiography;  Surgeon: Algernon Huxley, MD;  Location: [place] CV LAB;  Service: Cardiovascular;  Laterality: Left;  VACUUM ASSISTED CLOSURE CHANGE Left 10/03/2019   Procedure: LEFT THIGH VACUUM ASSISTED CLOSURE CHANGE;  Surgeon: Algernon Huxley, MD;  Location: [facility];  Service: General;  Laterality: Left;   WOUND DEBRIDEMENT Left 10/18/2015   Procedure: DEBRIDEMENT WOUND   ( LEFT BKA DEBRIDEMENT );  Surgeon: Algernon Huxley, MD;  Location: [facility] OR[facility];  Service: Vascular;  Laterality: Left;      No Known Allergies  Current Outpatient Medications  Medication Sig Dispense Refill   acetaminophen (TYLENOL) 325 MG tablet Take 650 mg by mouth every 4 (four)  hours as needed for fever.      acidophilus (RISAQUAD) CAPS capsule Take 1 capsule by mouth 2 (two) times daily.     apixaban (ELIQUIS) 2.5 MG TABS tablet Take 1 tablet (2.5 mg total) by mouth 2 (two) times daily. 60 tablet 3   ascorbic acid (VITAMIN C) 500 MG tablet Take 500 mg by mouth 2 (two) times daily.     aspirin EC 81 MG EC tablet Take 1 tablet (81 mg total) by mouth daily. Swallow whole. 90 tablet 3   atorvastatin (LIPITOR) 10 MG tablet Take 1 tablet (10 mg total) by mouth daily. 30 tablet 11   baclofen (LIORESAL) 10 MG tablet Take 10 mg by mouth 3 (three) times daily. Hold for sedation     brimonidine-timolol (COMBIGAN) 0.2-0.5 % ophthalmic solution Place 1 drop into the right eye 2 (two) times daily.     docusate sodium (COLACE) 100 MG capsule Take 100 mg by mouth daily as needed for mild constipation.     fentaNYL (DURAGESIC) 25 MCG/HR Place 1 patch onto the skin every 3 (three) days.      ferrous sulfate 324 (65 Fe) MG TBEC Take 324 mg by mouth 2 (two) times daily.      Fluticasone-Umeclidin-Vilant 100-62.5-25 MCG/ACT AEPB Inhale 1 puff into the lungs every morning.      furosemide (LASIX) 40 MG tablet Take 40 mg by mouth daily.     gabapentin (NEURONTIN) 100 MG capsule Take 100 mg by mouth at bedtime.      guaiFENesin (ROBITUSSIN) 100 MG/5ML SOLN Take 15 mLs by mouth 3 (three) times daily as needed for cough (congestion).     ketorolac (ACULAR) 0.5 % ophthalmic solution Place 1 drop into the left eye 2 (two) times daily.     loperamide (IMODIUM) 2 MG capsule Take 2 mg by mouth 3 (three) times daily as needed for diarrhea or loose stools.     nitroGLYCERIN (NITROSTAT) 0.4 MG SL tablet Place 0.14 mg under the tongue every 5 (five) minutes as needed for chest pain.     Omega-3 Fatty Acids (FISH OIL) 1000 MG CAPS Take 1,000 mg by mouth daily.      Oxycodone HCl 10 MG TABS Take 10 mg by mouth every 4 (four) hours as needed (severe pain).     oxymetazoline (AFRIN) 0.05 % nasal spray Place  2 sprays into both nostrils 2 (two) times daily as needed (For nose bleeds).     pantoprazole (PROTONIX) 40 MG tablet Take 1 tablet (40 mg total) by mouth 2 (two) times daily. 60 tablet 0   potassium chloride SA (K-DUR,KLOR-CON) 20 MEQ tablet Take 20 mEq by mouth daily.     sodium chloride (OCEAN) 0.65 % SOLN nasal spray Place 2 sprays into both nostrils 2 (two) times daily as needed for congestion.     sulfamethoxazole-trimethoprim (BACTRIM DS) 800-160 MG tablet Take 1 tablet by mouth 2 (two)  times daily. 20 tablet 0   tamsulosin (FLOMAX) 0.4 MG CAPS capsule Take 1 capsule (0.4 mg total) by mouth daily. 30 capsule 0   No current facility-administered medications for this visit.        Physical Exam BP 125/68    Pulse 62    Ht 6\' 1"  (1.854 m)    Wt 224 lb (101.6 kg)    BMI 29.55 kg/m  Gen:  WD/WN, NAD Skin: incision C/D/I, there is 2+ edema of the right above-knee stump.     Assessment/Plan: 1. Ischemic leg Patient is status post successful right above-knee amputation of the right.  With respect to the edema of his stump I did recommend a shrinker but he refused that.  He is not interested in moving forward with prostheses.  I have asked that he follow-up in 6 months.      Hortencia Pilar 10/09/2021, 11:36 AM   This note was created with Dragon medical transcription system.  Any errors from dictation are unintentional.

## 2021-12-13 ENCOUNTER — Encounter: Payer: Self-pay | Admitting: Oncology

## 2021-12-20 ENCOUNTER — Encounter (INDEPENDENT_AMBULATORY_CARE_PROVIDER_SITE_OTHER): Payer: Self-pay | Admitting: Vascular Surgery

## 2021-12-20 ENCOUNTER — Ambulatory Visit (INDEPENDENT_AMBULATORY_CARE_PROVIDER_SITE_OTHER): Payer: Medicare Other | Admitting: Vascular Surgery

## 2021-12-20 ENCOUNTER — Inpatient Hospital Stay: Payer: Medicare Other | Attending: Oncology

## 2021-12-20 VITALS — BP 131/77 | HR 74 | Resp 16

## 2021-12-20 DIAGNOSIS — E785 Hyperlipidemia, unspecified: Secondary | ICD-10-CM

## 2021-12-20 DIAGNOSIS — Z89612 Acquired absence of left leg above knee: Secondary | ICD-10-CM

## 2021-12-20 DIAGNOSIS — I70221 Atherosclerosis of native arteries of extremities with rest pain, right leg: Secondary | ICD-10-CM

## 2021-12-20 DIAGNOSIS — I10 Essential (primary) hypertension: Secondary | ICD-10-CM | POA: Diagnosis not present

## 2021-12-20 DIAGNOSIS — I70235 Atherosclerosis of native arteries of right leg with ulceration of other part of foot: Secondary | ICD-10-CM

## 2021-12-20 NOTE — Assessment & Plan Note (Signed)
Now with bilateral above-knee amputations well-healed.  Return in 1 year ?

## 2021-12-20 NOTE — Progress Notes (Signed)
? ? ?MRN : [id number] ? ?Timothy Juarez is a 71 y.o. (09-04-1950) male who presents with chief complaint of No chief complaint on file. ?. ? ?History of Present Illness: Patient returns today in follow up of his PAD and now with bilateral above-knee amputations.  His most recent amputation was on the right about 5 months ago.  It is well-healed.  His left above-knee amputation is remote and well-healed.  He still has some phantom pain and he says the phantom pain came back in his left leg after the right leg was removed.  No open wounds or infection.  No major issues at this time ? ?Current Outpatient Medications  ?Medication Sig Dispense Refill  ? acetaminophen (TYLENOL) 325 MG tablet Take 650 mg by mouth every 4 (four) hours as needed for fever.     ? apixaban (ELIQUIS) 2.5 MG TABS tablet Take 1 tablet (2.5 mg total) by mouth 2 (two) times daily. 60 tablet 3  ? ascorbic acid (VITAMIN C) 500 MG tablet Take 500 mg by mouth 2 (two) times daily.    ? aspirin EC 81 MG EC tablet Take 1 tablet (81 mg total) by mouth daily. Swallow whole. 90 tablet 3  ? atorvastatin (LIPITOR) 10 MG tablet Take 1 tablet (10 mg total) by mouth daily. 30 tablet 11  ? baclofen (LIORESAL) 10 MG tablet Take 10 mg by mouth 3 (three) times daily. Hold for sedation    ? brimonidine-timolol (COMBIGAN) 0.2-0.5 % ophthalmic solution Place 1 drop into the right eye 2 (two) times daily.    ? docusate sodium (COLACE) 100 MG capsule Take 100 mg by mouth daily as needed for mild constipation.    ? fentaNYL (DURAGESIC) 25 MCG/HR Place 1 patch onto the skin every 3 (three) days.     ? ferrous sulfate 324 (65 Fe) MG TBEC Take 324 mg by mouth 2 (two) times daily.     ? Fluticasone-Umeclidin-Vilant 100-62.5-25 MCG/ACT AEPB Inhale 1 puff into the lungs every morning.     ? furosemide (LASIX) 40 MG tablet Take 40 mg by mouth daily.    ? gabapentin (NEURONTIN) 100 MG capsule Take 100 mg by mouth at bedtime.     ? guaiFENesin (ROBITUSSIN) 100 MG/5ML SOLN Take 15  mLs by mouth 3 (three) times daily as needed for cough (congestion).    ? ketorolac (ACULAR) 0.5 % ophthalmic solution Place 1 drop into the left eye 2 (two) times daily.    ? loperamide (IMODIUM) 2 MG capsule Take 2 mg by mouth 3 (three) times daily as needed for diarrhea or loose stools.    ? nitroGLYCERIN (NITROSTAT) 0.4 MG SL tablet Place 0.14 mg under the tongue every 5 (five) minutes as needed for chest pain.    ? Omega-3 Fatty Acids (FISH OIL) 1000 MG CAPS Take 1,000 mg by mouth daily.     ? Oxycodone HCl 10 MG TABS Take 10 mg by mouth every 4 (four) hours as needed (severe pain).    ? oxymetazoline (AFRIN) 0.05 % nasal spray Place 2 sprays into both nostrils 2 (two) times daily as needed (For nose bleeds).    ? pantoprazole (PROTONIX) 40 MG tablet Take 1 tablet (40 mg total) by mouth 2 (two) times daily. 60 tablet 0  ? potassium chloride SA (K-DUR,KLOR-CON) 20 MEQ tablet Take 20 mEq by mouth daily.    ? sodium chloride (OCEAN) 0.65 % SOLN nasal spray Place 2 sprays into both nostrils 2 (two) times daily as  needed for congestion.    ? tamsulosin (FLOMAX) 0.4 MG CAPS capsule Take 1 capsule (0.4 mg total) by mouth daily. 30 capsule 0  ? acidophilus (RISAQUAD) CAPS capsule Take 1 capsule by mouth 2 (two) times daily. (Patient not taking: Reported on 12/20/2021)    ? sulfamethoxazole-trimethoprim (BACTRIM DS) 800-160 MG tablet Take 1 tablet by mouth 2 (two) times daily. (Patient not taking: Reported on 12/20/2021) 20 tablet 0  ? ?No current facility-administered medications for this visit.  ? ? ?Past Medical History:  ?Diagnosis Date  ? Acute embolism and thombos unsp deep vn unsp lower extremity (HCC)   ? Acute respiratory failure ([place])   ? AKI (acute kidney injury) ([place]) 12/27/2018  ? Allergy   ? Anemia   ? Aneurysm of unspecified site [facility])   ? ARF (acute respiratory failure) ([place])   ? H/O  ? Bronchitis   ? Cellulitis and abscess of leg 09/27/2019  ? CHF (congestive heart failure) ([place])   ? COPD (chronic  obstructive pulmonary disease) ([place])   ? Cough   ? Epistaxis   ? Gastrointestinal hemorrhage   ? GERD (gastroesophageal reflux disease)   ? Gout   ? Hematemesis 04/27/2020  ? High anion gap metabolic acidosis 19/14/7829  ? Hyperlipidemia   ? Hypertension   ? Hypokalemia   ? Infection of above knee amputation stump ([place]) 09/27/2019  ? Insomnia   ? Iron deficiency anemia 04/21/2017  ? Muscle contracture   ? MUSCLE SPASMS, muscle weakness  ? Peripheral vascular disease ([place])   ? Pneumonia   ? Pressure ulcer   ? Severe sepsis ([place]) 12/27/2018  ? Toxic encephalopathy 12/27/2018  ? ? ?Past Surgical History:  ?Procedure Laterality Date  ? AMPUTATION Left 09/19/2015  ? Procedure: AMPUTATION BELOW KNEE;  Surgeon: Algernon Huxley, MD;  Location: [facility] ORS;  Service: Vascular;  Laterality: Left;  ? AMPUTATION Left 11/01/2015  ? Procedure: AMPUTATION ABOVE KNEE;  Surgeon: Algernon Huxley, MD;  Location: [facility];  Service: Vascular;  Laterality: Left;  ? AMPUTATION Left 09/29/2019  ? Procedure: IRRIGATION AND DEBRIDEMENT OF LEFT AKA;  Surgeon: Algernon Huxley, MD;  Location: [facility];  Service: General;  Laterality: Left;  ? AMPUTATION Right 07/18/2021  ? Procedure: AMPUTATION ABOVE KNEE;  Surgeon: Algernon Huxley, MD;  Location: [facility];  Service: Vascular;  Laterality: Right;  ? APPLICATION OF WOUND VAC Left 10/18/2015  ? Procedure: APPLICATION OF WOUND VAC;  Surgeon: Algernon Huxley, MD;  Location: [facility];  Service: Vascular;  Laterality: Left;  ? APPLICATION OF WOUND VAC Left 09/30/2019  ? Procedure: APPLICATION OF WOUND VAC;  Surgeon: Katha Cabal, MD;  Location: [facility];  Service: Vascular;  Laterality: Left;  serial # [id number]  ? CARDIAC CATHETERIZATION    ? CATARACT EXTRACTION W/PHACO Right 08/18/2019  ? Procedure: CATARACT EXTRACTION PHACO AND INTRAOCULAR LENS PLACEMENT ([place]) RIGHT;  Surgeon: Birder Robson, MD;  Location: [facility];  Service: Ophthalmology;  Laterality: Right;  [place] 03:29.0 ?CDE 45.68 ?Fluid Pack Lot # [id number]  ?  CENTRAL VENOUS CATHETER INSERTION Right 09/30/2019  ? Procedure: INSERTION CENTRAL LINE ADULT;  Surgeon: Katha Cabal, MD;  Location: [facility];  Service: Vascular;  Laterality: Right;  ? COLONOSCOPY WITH PROPOFOL N/A 05/25/2017  ? Procedure: COLONOSCOPY WITH PROPOFOL;  Surgeon: Jonathon Bellows, MD;  Location[facility] ENDOSCOPY;  Service: Gastroenterology;  Laterality: N/A;  ? COLONOSCOPY WITH PROPOFOL N/A 10/14/2017  ? Procedure: COLONOSCOPY WITH PROPOFOL;  Surgeon: Jonathon Bellows, MD;  Location: [facility] ENDOSCOPY;  Service: Gastroenterology;  Laterality: N/A;  ? ESOPHAGOGASTRODUODENOSCOPY (EGD) WITH PROPOFOL N/A 05/25/2017  ? Procedure: ESOPHAGOGASTRODUODENOSCOPY (EGD) WITH PROPOFOL;  Surgeon: Jonathon Bellows, MD;  Location: [facility] ENDOSCOPY;  Service: Gastroenterology;  Laterality: N/A;  ? ESOPHAGOGASTRODUODENOSCOPY (EGD) WITH PROPOFOL N/A 10/14/2017  ? Procedure: ESOPHAGOGASTRODUODENOSCOPY (EGD) WITH PROPOFOL;  Surgeon: Jonathon Bellows, MD;  Location: [facility] ENDOSCOPY;  Service: Gastroenterology;  Laterality: N/A;  ? ESOPHAGOGASTRODUODENOSCOPY (EGD) WITH PROPOFOL N/A 04/27/2020  ? Procedure: ESOPHAGOGASTRODUODENOSCOPY (EGD) WITH PROPOFOL;  Surgeon: Lin Landsman, MD;  Location: [facility] ENDOSCOPY;  Service: Gastroenterology;  Laterality: N/A;  ? ESOPHAGOGASTRODUODENOSCOPY (EGD) WITH PROPOFOL N/A 06/18/2020  ? Procedure: ESOPHAGOGASTRODUODENOSCOPY (EGD) WITH PROPOFOL;  Surgeon: Lin Landsman, MD;  Location: [facility] ENDOSCOPY;  Service: Gastroenterology;  Laterality: N/A;  ? EYE SURGERY    ? GIVENS CAPSULE STUDY N/A 07/14/2017  ? Procedure: GIVENS CAPSULE STUDY;  Surgeon: Jonathon Bellows, MD;  Location: [facility] ENDOSCOPY;  Service: Gastroenterology;  Laterality: N/A;  ? GIVENS CAPSULE STUDY N/A 10/30/2017  ? Procedure: GIVENS CAPSULE STUDY 12 HR;  Surgeon: Jonathon Bellows, MD;  Location: [facility] ENDOSCOPY;  Service: Gastroenterology;  Laterality: N/A;  ? IVC FILTER INSERTION N/A 10/12/2017  ? Procedure: IVC FILTER INSERTION;  Surgeon: Algernon Huxley,  MD;  Location: [place] CV LAB;  Service: Cardiovascular;  Laterality: N/A;  ? LOWER EXTREMITY ANGIOGRAPHY Right 10/04/2018  ? Procedure: LOWER EXTREMITY ANGIOGRAPHY;  Surgeon: Algernon Huxley, MD;  [facility]

## 2021-12-23 ENCOUNTER — Inpatient Hospital Stay: Payer: Medicare Other | Admitting: Oncology

## 2022-04-06 NOTE — Progress Notes (Unsigned)
MRN : [id number]  Timothy Juarez is a 71 y.o. (08-29-1950) male who presents with chief complaint of check circulation.  History of Present Illness:   The patient returns to the office for followup s/p bilateral AKAs.   There have been no interval changes in lower extremity symptoms. No development of rest pain symptoms. No new ulcers or wounds have occurred since the last visit.  There have been no significant changes to the patient's overall health care.  The patient denies amaurosis fugax or recent TIA symptoms. There are no documented recent neurological changes noted. There is no history of DVT, PE or superficial thrombophlebitis. The patient denies recent episodes of angina or shortness of breath.    No outpatient medications have been marked as taking for the 04/07/22 encounter (Appointment) with Delana Meyer, Dolores Lory, MD.    Past Medical History:  Diagnosis Date   Acute embolism and thombos unsp deep vn unsp lower extremity ([place])    Acute respiratory failure ([place])    AKI (acute kidney injury) ([place]) 12/27/2018   Allergy    Anemia    Aneurysm of unspecified site ([place])    ARF (acute respiratory failure) ([place])    H/O   Bronchitis    Cellulitis and abscess of leg 09/27/2019   CHF (congestive heart failure) (HCC)    COPD (chronic obstructive pulmonary disease) (HCC)    Cough    Epistaxis    Gastrointestinal hemorrhage    GERD (gastroesophageal reflux disease)    Gout    Hematemesis 04/27/2020   High anion gap metabolic acidosis 83/15/1761   Hyperlipidemia    Hypertension    Hypokalemia    Infection of above knee amputation stump ([place]) 09/27/2019   Insomnia    Iron deficiency anemia 04/21/2017   Muscle contracture    MUSCLE SPASMS, muscle weakness   Peripheral vascular disease (HCC)    Pneumonia    Pressure ulcer    Severe sepsis ([place]) 12/27/2018   Toxic encephalopathy 12/27/2018    Past Surgical History:  Procedure Laterality Date    AMPUTATION Left 09/19/2015   Procedure: AMPUTATION BELOW KNEE;  Surgeon: Algernon Huxley, MD;  Location: [facility];  Service: Vascular;  Laterality: Left;   AMPUTATION Left 11/01/2015   Procedure: AMPUTATION ABOVE KNEE;  Surgeon: Algernon Huxley, MD;  Location: [facility];  Service: Vascular;  Laterality: Left;   AMPUTATION Left 09/29/2019   Procedure: IRRIGATION AND DEBRIDEMENT OF LEFT AKA;  Surgeon: Algernon Huxley, MD;  Location: [place] [facility];  Service: General;  Laterality: Left;   AMPUTATION Right 07/18/2021   Procedure: AMPUTATION ABOVE KNEE;  Surgeon: Algernon Huxley, MD;  Location: [facility];  Service: Vascular;  Laterality: Right;   APPLICATION OF WOUND VAC Left 10/18/2015   Procedure: APPLICATION OF WOUND VAC;  Surgeon: Algernon Huxley, MD;  Location: [facility];  Service: Vascular;  Laterality: Left;   APPLICATION OF WOUND VAC Left 09/30/2019   Procedure: APPLICATION OF WOUND VAC;  Surgeon: Katha Cabal, MD;  Location: [facility];  Service: Vascular;  Laterality: Left;  serial # [id number]   IRSWNIO CATHETERIZATION     CATARACT EXTRACTION W/PHACO Right 08/18/2019   Procedure: CATARACT EXTRACTION PHACO AND INTRAOCULAR LENS PLACEMENT ([place]) RIGHT;  Surgeon: Birder Robson, MD;  Location: [facility];  Service: Ophthalmology;  Laterality: Right;  [place] 03:29.0 CDE 45.68 Fluid Pack Lot # [id number]   CENTRAL VENOUS CATHETER INSERTION  Right 09/30/2019   Procedure: INSERTION CENTRAL LINE ADULT;  Surgeon: Katha Cabal, MD;  Location: [facility];  Service: Vascular;  Laterality: Right;   COLONOSCOPY WITH PROPOFOL N/A 05/25/2017   Procedure: COLONOSCOPY WITH PROPOFOL;  Surgeon: Jonathon Bellows, MD;  Location: [facility] ENDOSCOPY;  Service: Gastroenterology;  Laterality: N/A;   COLONOSCOPY WITH PROPOFOL N/A 10/14/2017   Procedure: COLONOSCOPY WITH PROPOFOL;  Surgeon: Jonathon Bellows, MD;  Location: [facility] ENDOSCOPY;  Service: Gastroenterology;  Laterality: N/A;   ESOPHAGOGASTRODUODENOSCOPY (EGD) WITH PROPOFOL N/A 05/25/2017   Procedure:  ESOPHAGOGASTRODUODENOSCOPY (EGD) WITH PROPOFOL;  Surgeon: Jonathon Bellows, MD;  Location: [facility] ENDOSCOPY;  Service: Gastroenterology;  Laterality: N/A;   ESOPHAGOGASTRODUODENOSCOPY (EGD) WITH PROPOFOL N/A 10/14/2017   Procedure: ESOPHAGOGASTRODUODENOSCOPY (EGD) WITH PROPOFOL;  Surgeon: Jonathon Bellows, MD;  Location: [facility] ENDOSCOPY;  Service: Gastroenterology;  Laterality: N/A;   ESOPHAGOGASTRODUODENOSCOPY (EGD) WITH PROPOFOL N/A 04/27/2020   Procedure: ESOPHAGOGASTRODUODENOSCOPY (EGD) WITH PROPOFOL;  Surgeon: Lin Landsman, MD;  Location: [facility] ENDOSCOPY;  Service: Gastroenterology;  Laterality: N/A;   ESOPHAGOGASTRODUODENOSCOPY (EGD) WITH PROPOFOL N/A 06/18/2020   Procedure: ESOPHAGOGASTRODUODENOSCOPY (EGD) WITH PROPOFOL;  Surgeon: Lin Landsman, MD;  Location: [facility] ENDOSCOPY;  Service: Gastroenterology;  Laterality: N/A;   EYE SURGERY     GIVENS CAPSULE STUDY N/A 07/14/2017   Procedure: GIVENS CAPSULE STUDY;  Surgeon: Jonathon Bellows, MD;  Location: [facility] ENDOSCOPY;  Service: Gastroenterology;  Laterality: N/A;   GIVENS CAPSULE STUDY N/A 10/30/2017   Procedure: GIVENS CAPSULE STUDY 12 HR;  Surgeon: Jonathon Bellows, MD;  Location: [facility] ENDOSCOPY;  Service: Gastroenterology;  Laterality: N/A;   IVC FILTER INSERTION N/A 10/12/2017   Procedure: IVC FILTER INSERTION;  Surgeon: Algernon Huxley, MD;  Location: [place] CV LAB;  Service: Cardiovascular;  Laterality: N/A;   LOWER EXTREMITY ANGIOGRAPHY Right 10/04/2018   Procedure: LOWER EXTREMITY ANGIOGRAPHY;  Surgeon: Algernon Huxley, MD;  Location: [place] CV LAB;  Service: Cardiovascular;  Laterality: Right;   LOWER EXTREMITY ANGIOGRAPHY Right 03/21/2021   Procedure: LOWER EXTREMITY ANGIOGRAPHY;  Surgeon: Algernon Huxley, MD;  Location: [place] CV LAB;  Service: Cardiovascular;  Laterality: Right;   LOWER EXTREMITY ANGIOGRAPHY Right 05/23/2021   Procedure: LOWER EXTREMITY ANGIOGRAPHY;  Surgeon: Algernon Huxley, MD;  Location: [place] CV LAB;  Service:  Cardiovascular;  Laterality: Right;   PERIPHERAL VASCULAR CATHETERIZATION Left 01/18/2015   Procedure: Lower Extremity Angiography;  Surgeon: Algernon Huxley, MD;  Location: [place] CV LAB;  Service: Cardiovascular;  Laterality: Left;   PERIPHERAL VASCULAR CATHETERIZATION N/A 01/18/2015   Procedure: Lower Extremity Intervention;  Surgeon: Algernon Huxley, MD;  Location: [place] CV LAB;  Service: Cardiovascular;  Laterality: N/A;   PERIPHERAL VASCULAR CATHETERIZATION  07/30/2015   Procedure: Lower Extremity Intervention;  Surgeon: Algernon Huxley, MD;  Location: [place] CV LAB;  Service: Cardiovascular;;   PERIPHERAL VASCULAR CATHETERIZATION N/A 07/30/2015   Procedure: Abdominal Aortogram w/Lower Extremity;  Surgeon: Algernon Huxley, MD;  Location: [place] CV LAB;  Service: Cardiovascular;  Laterality: N/A;   PERIPHERAL VASCULAR CATHETERIZATION Left 08/22/2015   Procedure: Lower Extremity Angiography;  Surgeon: Algernon Huxley, MD;  Location: [place] CV LAB;  Service: Cardiovascular;  Laterality: Left;   PERIPHERAL VASCULAR CATHETERIZATION Left 08/22/2015   Procedure: Lower Extremity Intervention;  Surgeon: Algernon Huxley, MD;  Location: [place] CV LAB;  Service: Cardiovascular;  Laterality: Left;   PERIPHERAL VASCULAR CATHETERIZATION Right 03/31/2016   Procedure: Lower Extremity Angiography;  Surgeon: Algernon Huxley, MD;  Location: [place]  CV LAB;  Service: Cardiovascular;  Laterality: Right;   PERIPHERAL VASCULAR CATHETERIZATION  03/31/2016   Procedure: Lower Extremity Intervention;  Surgeon: Algernon Huxley, MD;  Location: [place] CV LAB;  Service: Cardiovascular;;   PERIPHERAL VASCULAR CATHETERIZATION Left 04/10/2016   Procedure: Lower Extremity Angiography;  Surgeon: Algernon Huxley, MD;  Location: [place] CV LAB;  Service: Cardiovascular;  Laterality: Left;   VACUUM ASSISTED CLOSURE CHANGE Left 10/03/2019   Procedure: LEFT THIGH VACUUM ASSISTED CLOSURE CHANGE;  Surgeon: Algernon Huxley,  MD;  Location: [facility];  Service: General;  Laterality: Left;   WOUND DEBRIDEMENT Left 10/18/2015   Procedure: DEBRIDEMENT WOUND   ( LEFT BKA DEBRIDEMENT );  Surgeon: Algernon Huxley, MD;  Location: [facility];  Service: Vascular;  Laterality: Left;    Social History Social History   Tobacco Use   Smoking status: Former    Packs/day: 1.00    Years: 44.00    Total pack years: 44.00    Types: Cigarettes    Quit date: 11/17/2012    Years since quitting: 9.3   Smokeless tobacco: Never  Vaping Use   Vaping Use: Never used  Substance Use Topics   Alcohol use: Not Currently   Drug use: Never    Family History Family History  Problem Relation Age of Onset   Heart attack Mother    Varicose Veins Neg Hx     No Known Allergies   REVIEW OF SYSTEMS (Negative unless checked)  Constitutional: '[]'$ Weight loss  '[]'$ Fever  '[]'$ Chills Cardiac: '[]'$ Chest pain   '[]'$ Chest pressure   '[]'$ Palpitations   '[]'$ Shortness of breath when laying flat   '[]'$ Shortness of breath with exertion. Vascular:  '[x]'$ Pain in legs with walking   '[]'$ Pain in legs at rest  '[]'$ History of DVT   '[]'$ Phlebitis   '[]'$ Swelling in legs   '[]'$ Varicose veins   '[]'$ Non-healing ulcers Pulmonary:   '[]'$ Uses home oxygen   '[]'$ Productive cough   '[]'$ Hemoptysis   '[]'$ Wheeze  '[]'$ COPD   '[]'$ Asthma Neurologic:  '[]'$ Dizziness   '[]'$ Seizures   '[]'$ History of stroke   '[]'$ History of TIA  '[]'$ Aphasia   '[]'$ Vissual changes   '[]'$ Weakness or numbness in arm   '[]'$ Weakness or numbness in leg Musculoskeletal:   '[]'$ Joint swelling   '[]'$ Joint pain   '[]'$ Low back pain Hematologic:  '[]'$ Easy bruising  '[]'$ Easy bleeding   '[]'$ Hypercoagulable state   '[]'$ Anemic Gastrointestinal:  '[]'$ Diarrhea   '[]'$ Vomiting  '[]'$ Gastroesophageal reflux/heartburn   '[]'$ Difficulty swallowing. Genitourinary:  '[]'$ Chronic kidney disease   '[]'$ Difficult urination  '[]'$ Frequent urination   '[]'$ Blood in urine Skin:  '[]'$ Rashes   '[]'$ Ulcers  Psychological:  '[]'$ History of anxiety   '[]'$  History of major depression.  Physical Examination  There were no vitals filed  for this visit. There is no height or weight on file to calculate BMI. Gen: WD/WN, NAD Head: [place]/AT, No temporalis wasting.  Ear/Nose/Throat: Hearing grossly intact, nares w/o erythema or drainage Eyes: PER, EOMI, sclera nonicteric.  Neck: Supple, no masses.  No bruit or JVD.  Pulmonary:  Good air movement, no audible wheezing, no use of accessory muscles.  Cardiac: RRR, normal S1, S2, no Murmurs. Vascular:  mild trophic changes, no open wounds Vessel Right Left  Radial Palpable Palpable  PT Not Palpable Not Palpable  DP Not Palpable Not Palpable  Gastrointestinal: soft, non-distended. No guarding/no peritoneal signs.  Musculoskeletal: M/S 5/5 throughout.  No visible deformity.  Neurologic: CN 2-12 intact. Pain and light touch intact in extremities.  Symmetrical.  Speech is fluent. Motor exam as  listed above. Psychiatric: Judgment intact, Mood & affect appropriate for pt's clinical situation. Dermatologic: No rashes or ulcers noted.  No changes consistent with cellulitis.   CBC Lab Results  Component Value Date   WBC 17.4 (H) 07/22/2021   HGB 10.8 (L) 07/22/2021   HCT 34.9 (L) 07/22/2021   MCV 80.2 07/22/2021   PLT 443 (H) 07/22/2021    BMET    Component Value Date/Time   NA 139 07/22/2021 1131   NA 136 12/01/2013 1317   K 4.5 07/22/2021 1131   K 3.9 12/01/2013 1317   CL 106 07/22/2021 1131   CL 100 12/01/2013 1317   CO2 27 07/22/2021 1131   CO2 33 (H) 12/01/2013 1317   GLUCOSE 127 (H) 07/22/2021 1131   GLUCOSE 91 12/01/2013 1317   BUN 15 07/22/2021 1131   BUN 24 (H) 09/25/2014 0846   CREATININE 1.09 07/22/2021 1131   CREATININE 1.29 09/25/2014 0846   CALCIUM 8.8 (L) 07/22/2021 1131   CALCIUM 9.4 12/01/2013 1317   GFRNONAA >60 07/22/2021 1131   GFRNONAA 60 (L) 09/25/2014 0846   GFRNONAA >60 03/16/2014 0750   GFRAA >60 04/30/2020 0650   GFRAA >60 09/25/2014 0846   GFRAA >60 03/16/2014 0750   CrCl cannot be calculated (Patient's most recent lab result is older  than the maximum 21 days allowed.).  COAG Lab Results  Component Value Date   INR 1.1 03/21/2021   INR 1.2 04/27/2020   INR 1.1 09/29/2019    Radiology No results found.   Assessment/Plan There are no diagnoses linked to this encounter.   Hortencia Pilar, MD  04/06/2022 3:41 PM

## 2022-04-07 ENCOUNTER — Ambulatory Visit (INDEPENDENT_AMBULATORY_CARE_PROVIDER_SITE_OTHER): Payer: Medicare Other | Admitting: Vascular Surgery

## 2022-04-07 ENCOUNTER — Encounter (INDEPENDENT_AMBULATORY_CARE_PROVIDER_SITE_OTHER): Payer: Self-pay | Admitting: Vascular Surgery

## 2022-04-07 VITALS — BP 151/61 | HR 57 | Resp 17

## 2022-04-07 DIAGNOSIS — Z89611 Acquired absence of right leg above knee: Secondary | ICD-10-CM

## 2022-04-07 DIAGNOSIS — I70229 Atherosclerosis of native arteries of extremities with rest pain, unspecified extremity: Secondary | ICD-10-CM | POA: Diagnosis not present

## 2022-04-07 DIAGNOSIS — Z89612 Acquired absence of left leg above knee: Secondary | ICD-10-CM | POA: Diagnosis not present

## 2022-04-07 DIAGNOSIS — I70221 Atherosclerosis of native arteries of extremities with rest pain, right leg: Secondary | ICD-10-CM

## 2022-04-07 DIAGNOSIS — E785 Hyperlipidemia, unspecified: Secondary | ICD-10-CM

## 2022-04-07 DIAGNOSIS — I10 Essential (primary) hypertension: Secondary | ICD-10-CM | POA: Diagnosis not present

## 2022-12-19 ENCOUNTER — Ambulatory Visit (INDEPENDENT_AMBULATORY_CARE_PROVIDER_SITE_OTHER): Payer: Medicare Other | Admitting: Vascular Surgery

## 2023-04-06 ENCOUNTER — Ambulatory Visit (INDEPENDENT_AMBULATORY_CARE_PROVIDER_SITE_OTHER): Payer: Medicare Other | Admitting: Vascular Surgery

## 2023-04-06 ENCOUNTER — Encounter (INDEPENDENT_AMBULATORY_CARE_PROVIDER_SITE_OTHER): Payer: Self-pay | Admitting: Vascular Surgery

## 2023-04-06 VITALS — BP 117/65 | HR 105 | Resp 18 | Wt 224.0 lb

## 2023-04-06 DIAGNOSIS — J4489 Other specified chronic obstructive pulmonary disease: Secondary | ICD-10-CM

## 2023-04-06 DIAGNOSIS — E785 Hyperlipidemia, unspecified: Secondary | ICD-10-CM

## 2023-04-06 DIAGNOSIS — Z89612 Acquired absence of left leg above knee: Secondary | ICD-10-CM

## 2023-04-06 DIAGNOSIS — R0989 Other specified symptoms and signs involving the circulatory and respiratory systems: Secondary | ICD-10-CM

## 2023-04-06 DIAGNOSIS — Z89611 Acquired absence of right leg above knee: Secondary | ICD-10-CM | POA: Insufficient documentation

## 2023-04-06 DIAGNOSIS — I70223 Atherosclerosis of native arteries of extremities with rest pain, bilateral legs: Secondary | ICD-10-CM

## 2023-04-06 DIAGNOSIS — I10 Essential (primary) hypertension: Secondary | ICD-10-CM

## 2023-04-06 NOTE — Progress Notes (Signed)
MRN : [id number]  Timothy Juarez is a 72 y.o. (03/09/1951) male who presents with chief complaint of check circulation.  History of Present Illness:  The patient returns to the office for followup s/p bilateral AKAs.    There have been no interval changes in lower extremity symptoms.  He does have to deal with phantom pains but they seem to come and go not keeping him up at night.  No development of rest pain symptoms. No new ulcers or wounds have occurred since the last visit.   There have been no significant changes to the patient's overall health care.   The patient denies amaurosis fugax or recent TIA symptoms. There are no documented recent neurological changes noted. There is no history of DVT, PE or superficial thrombophlebitis. The patient denies recent episodes of angina or shortness of breath.    Current Meds  Medication Sig   acetaminophen (TYLENOL) 325 MG tablet Take 650 mg by mouth every 4 (four) hours as needed for fever.    acidophilus (RISAQUAD) CAPS capsule Take 1 capsule by mouth 2 (two) times daily.   ALPHAGAN P 0.1 % SOLN Apply to eye.   apixaban (ELIQUIS) 2.5 MG TABS tablet Take 1 tablet (2.5 mg total) by mouth 2 (two) times daily.   ascorbic acid (VITAMIN C) 500 MG tablet Take 500 mg by mouth 2 (two) times daily.   aspirin EC 81 MG EC tablet Take 1 tablet (81 mg total) by mouth daily. Swallow whole.   atorvastatin (LIPITOR) 10 MG tablet Take 1 tablet (10 mg total) by mouth daily.   baclofen (LIORESAL) 10 MG tablet Take 10 mg by mouth 3 (three) times daily. Hold for sedation   brimonidine-timolol (COMBIGAN) 0.2-0.5 % ophthalmic solution Place 1 drop into the right eye 2 (two) times daily.   Cholecalciferol (VITAMIN D3) 50 MCG (2000 UT) TABS Take by mouth.   docusate sodium (COLACE) 100 MG capsule Take 100 mg by mouth daily as needed for mild constipation.   DULoxetine (CYMBALTA) 20 MG  capsule Take 20 mg by mouth 2 (two) times daily.   fentaNYL (DURAGESIC) 25 MCG/HR Place 1 patch onto the skin every 3 (three) days.    ferrous sulfate 324 (65 Fe) MG TBEC Take 324 mg by mouth 2 (two) times daily.    Fluticasone-Umeclidin-Vilant 100-62.5-25 MCG/ACT AEPB Inhale 1 puff into the lungs every morning.   furosemide (LASIX) 40 MG tablet Take 40 mg by mouth daily.   gabapentin (NEURONTIN) 100 MG capsule Take 100 mg by mouth at bedtime.    GOODSENSE ARTIFICIAL TEARS 0.5-0.6 % SOLN Apply to eye.   guaiFENesin (ROBITUSSIN) 100 MG/5ML SOLN Take 15 mLs by mouth 3 (three) times daily as needed for cough (congestion).   ketorolac (ACULAR) 0.5 % ophthalmic solution Place 1 drop into the left eye 2 (two) times daily.   loperamide (IMODIUM) 2 MG capsule Take 2 mg by mouth 3 (three) times daily as needed for diarrhea or loose stools.   nitroGLYCERIN (NITROSTAT) 0.4 MG SL tablet Place 0.14 mg under the tongue every 5 (five) minutes as needed  for chest pain.   Omega-3 Fatty Acids (FISH OIL) 1000 MG CAPS Take 1,000 mg by mouth daily.    Oxycodone HCl 10 MG TABS Take 10 mg by mouth every 4 (four) hours as needed (severe pain).   oxymetazoline (AFRIN) 0.05 % nasal spray Place 2 sprays into both nostrils 2 (two) times daily as needed (For nose bleeds).   pantoprazole (PROTONIX) 40 MG tablet Take 1 tablet (40 mg total) by mouth 2 (two) times daily.   potassium chloride SA (K-DUR,KLOR-CON) 20 MEQ tablet Take 20 mEq by mouth daily.   tamsulosin (FLOMAX) 0.4 MG CAPS capsule Take 1 capsule (0.4 mg total) by mouth daily.   timolol (TIMOPTIC) 0.5 % ophthalmic solution SMARTSIG:In Eye(s)    Past Medical History:  Diagnosis Date   Acute embolism and thombos unsp deep vn unsp lower extremity (HCC)    Acute respiratory failure (HCC)    AKI (acute kidney injury) (HCC) 12/27/2018   Allergy    Anemia    Aneurysm of unspecified site (HCC)    ARF (acute respiratory failure) (HCC)    H/O   Bronchitis     Cellulitis and abscess of leg 09/27/2019   CHF (congestive heart failure) (HCC)    COPD (chronic obstructive pulmonary disease) (HCC)    Cough    Epistaxis    Gastrointestinal hemorrhage    GERD (gastroesophageal reflux disease)    Gout    Hematemesis 04/27/2020   High anion gap metabolic acidosis 12/27/2018   Hyperlipidemia    Hypertension    Hypokalemia    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) 09/27/2019   Insomnia    Iron deficiency anemia 04/21/2017   Muscle contracture    MUSCLE SPASMS, muscle weakness   Peripheral vascular disease (HCC)    Pneumonia    Pressure ulcer    Severe sepsis (HCC) 12/27/2018   Toxic encephalopathy 12/27/2018    Past Surgical History:  Procedure Laterality Date   AMPUTATION Left 09/19/2015   Procedure: AMPUTATION BELOW KNEE;  Surgeon: Annice Needy, MD;  Location: [facility];  Service: Vascular;  Laterality: Left;   AMPUTATION Left 11/01/2015   Procedure: AMPUTATION ABOVE KNEE;  Surgeon: Annice Needy, MD;  Location: [facility];  Service: Vascular;  Laterality: Left;   AMPUTATION Left 09/29/2019   Procedure: IRRIGATION AND DEBRIDEMENT OF LEFT AKA;  Surgeon: Annice Needy, MD;  Location: [facility];  Service: General;  Laterality: Left;   AMPUTATION Right 07/18/2021   Procedure: AMPUTATION ABOVE KNEE;  Surgeon: Annice Needy, MD;  Location: [facility];  Service: Vascular;  Laterality: Right;   APPLICATION OF WOUND VAC Left 10/18/2015   Procedure: APPLICATION OF WOUND VAC;  Surgeon: Annice Needy, MD;  Location: [facility];  Service: Vascular;  Laterality: Left;   APPLICATION OF WOUND VAC Left 09/30/2019   Procedure: APPLICATION OF WOUND VAC;  Surgeon: Renford Dills, MD;  Location: [facility];  Service: Vascular;  Laterality: Left;  serial # [id number]   CARDIAC CATHETERIZATION     CATARACT EXTRACTION W/PHACO Right 08/18/2019   Procedure: CATARACT EXTRACTION PHACO AND INTRAOCULAR LENS PLACEMENT (IOC) RIGHT;  Surgeon: Galen Manila, MD;  Location: [facility];   Service: Ophthalmology;  Laterality: Right;  [place] 03:29.0 CDE 45.68 Fluid Pack Lot # [id number]   CENTRAL VENOUS CATHETER INSERTION Right 09/30/2019   Procedure: INSERTION CENTRAL LINE ADULT;  Surgeon: Renford Dills, MD;  Location: [facility];  Service: Vascular;  Laterality: Right;   COLONOSCOPY WITH PROPOFOL N/A  05/25/2017   Procedure: COLONOSCOPY WITH PROPOFOL;  Surgeon: Wyline Mood, MD;  Location: [facility] ENDOSCOPY;  Service: Gastroenterology;  Laterality: N/A;   COLONOSCOPY WITH PROPOFOL N/A 10/14/2017   Procedure: COLONOSCOPY WITH PROPOFOL;  Surgeon: Wyline Mood, MD;  Location: [facility] ENDOSCOPY;  Service: Gastroenterology;  Laterality: N/A;   ESOPHAGOGASTRODUODENOSCOPY (EGD) WITH PROPOFOL N/A 05/25/2017   Procedure: ESOPHAGOGASTRODUODENOSCOPY (EGD) WITH PROPOFOL;  Surgeon: Wyline Mood, MD;  Location: [facility] ENDOSCOPY;  Service: Gastroenterology;  Laterality: N/A;   ESOPHAGOGASTRODUODENOSCOPY (EGD) WITH PROPOFOL N/A 10/14/2017   Procedure: ESOPHAGOGASTRODUODENOSCOPY (EGD) WITH PROPOFOL;  Surgeon: Wyline Mood, MD;  Location: [facility] ENDOSCOPY;  Service: Gastroenterology;  Laterality: N/A;   ESOPHAGOGASTRODUODENOSCOPY (EGD) WITH PROPOFOL N/A 04/27/2020   Procedure: ESOPHAGOGASTRODUODENOSCOPY (EGD) WITH PROPOFOL;  Surgeon: Toney Reil, MD;  Location: [facility] ENDOSCOPY;  Service: Gastroenterology;  Laterality: N/A;   ESOPHAGOGASTRODUODENOSCOPY (EGD) WITH PROPOFOL N/A 06/18/2020   Procedure: ESOPHAGOGASTRODUODENOSCOPY (EGD) WITH PROPOFOL;  Surgeon: Toney Reil, MD;  Location: [facility] ENDOSCOPY;  Service: Gastroenterology;  Laterality: N/A;   EYE SURGERY     GIVENS CAPSULE STUDY N/A 07/14/2017   Procedure: GIVENS CAPSULE STUDY;  Surgeon: Wyline Mood, MD;  Location: [facility] ENDOSCOPY;  Service: Gastroenterology;  Laterality: N/A;   GIVENS CAPSULE STUDY N/A 10/30/2017   Procedure: GIVENS CAPSULE STUDY 12 HR;  Surgeon: Wyline Mood, MD;  Location: [facility] ENDOSCOPY;  Service: Gastroenterology;  Laterality: N/A;    IVC FILTER INSERTION N/A 10/12/2017   Procedure: IVC FILTER INSERTION;  Surgeon: Annice Needy, MD;  Location: [facility] INVASIVE CV LAB;  Service: Cardiovascular;  Laterality: N/A;   LOWER EXTREMITY ANGIOGRAPHY Right 10/04/2018   Procedure: LOWER EXTREMITY ANGIOGRAPHY;  Surgeon: Annice Needy, MD;  Location: [facility];  Service: Cardiovascular;  Laterality: Right;   LOWER EXTREMITY ANGIOGRAPHY Right 03/21/2021   Procedure: LOWER EXTREMITY ANGIOGRAPHY;  Surgeon: Annice Needy, MD;  Location: [facility];  Service: Cardiovascular;  Laterality: Right;   LOWER EXTREMITY ANGIOGRAPHY Right 05/23/2021   Procedure: LOWER EXTREMITY ANGIOGRAPHY;  Surgeon: Annice Needy, MD;  Location: [facility];  Service: Cardiovascular;  Laterality: Right;   PERIPHERAL VASCULAR CATHETERIZATION Left 01/18/2015   Procedure: Lower Extremity Angiography;  Surgeon: Annice Needy, MD;  Location: [facility];  Service: Cardiovascular;  Laterality: Left;   PERIPHERAL VASCULAR CATHETERIZATION N/A 01/18/2015   Procedure: Lower Extremity Intervention;  Surgeon: Annice Needy, MD;  Location: [facility];  Service: Cardiovascular;  Laterality: N/A;   PERIPHERAL VASCULAR CATHETERIZATION  07/30/2015   Procedure: Lower Extremity Intervention;  Surgeon: Annice Needy, MD;  Location: [facility];  Service: Cardiovascular;;   PERIPHERAL VASCULAR CATHETERIZATION N/A 07/30/2015   Procedure: Abdominal Aortogram w/Lower Extremity;  Surgeon: Annice Needy, MD;  Location: [facility];  Service: Cardiovascular;  Laterality: N/A;   PERIPHERAL VASCULAR CATHETERIZATION Left 08/22/2015   Procedure: Lower Extremity Angiography;  Surgeon: Annice Needy, MD;  Location: [facility];  Service: Cardiovascular;  Laterality: Left;   PERIPHERAL VASCULAR CATHETERIZATION Left 08/22/2015   Procedure: Lower Extremity Intervention;  Surgeon: Annice Needy, MD;  Location: [facility];  Service: Cardiovascular;   Laterality: Left;   PERIPHERAL VASCULAR CATHETERIZATION Right 03/31/2016   Procedure: Lower Extremity Angiography;  Surgeon: Annice Needy, MD;  Location: [facility];  Service: Cardiovascular;  Laterality: Right;   PERIPHERAL VASCULAR CATHETERIZATION  03/31/2016   Procedure: Lower Extremity Intervention;  Surgeon: Annice Needy, MD;  Location: [facility] INVASIVE  CV LAB;  Service: Cardiovascular;;   PERIPHERAL VASCULAR CATHETERIZATION Left 04/10/2016   Procedure: Lower Extremity Angiography;  Surgeon: Annice Needy, MD;  Location: [facility];  Service: Cardiovascular;  Laterality: Left;   VACUUM ASSISTED CLOSURE CHANGE Left 10/03/2019   Procedure: LEFT THIGH VACUUM ASSISTED CLOSURE CHANGE;  Surgeon: Annice Needy, MD;  Location: [facility];  Service: General;  Laterality: Left;   WOUND DEBRIDEMENT Left 10/18/2015   Procedure: DEBRIDEMENT WOUND   ( LEFT BKA DEBRIDEMENT );  Surgeon: Annice Needy, MD;  Location: [facility];  Service: Vascular;  Laterality: Left;    Social History Social History   Tobacco Use   Smoking status: Former    Current packs/day: 0.00    Average packs/day: 1 pack/day for 44.0 years (44.0 ttl pk-yrs)    Types: Cigarettes    Start date: 11/17/1968    Quit date: 11/17/2012    Years since quitting: 10.3   Smokeless tobacco: Never  Vaping Use   Vaping status: Never Used  Substance Use Topics   Alcohol use: Not Currently   Drug use: Never    Family History Family History  Problem Relation Age of Onset   Heart attack Mother    Varicose Veins Neg Hx     No Known Allergies   REVIEW OF SYSTEMS (Negative unless checked)  Constitutional: [] Weight loss  [] Fever  [] Chills Cardiac: [] Chest pain   [] Chest pressure   [] Palpitations   [] Shortness of breath when laying flat   [] Shortness of breath with exertion. Vascular:  [x] Pain in legs with walking   [] Pain in legs at rest  [] History of DVT   [] Phlebitis   [] Swelling in legs   [] Varicose veins   [] Non-healing  ulcers Pulmonary:   [] Uses home oxygen   [] Productive cough   [] Hemoptysis   [] Wheeze  [] COPD   [] Asthma Neurologic:  [] Dizziness   [] Seizures   [] History of stroke   [] History of TIA  [] Aphasia   [] Vissual changes   [] Weakness or numbness in arm   [] Weakness or numbness in leg Musculoskeletal:   [] Joint swelling   [] Joint pain   [] Low back pain Hematologic:  [] Easy bruising  [] Easy bleeding   [] Hypercoagulable state   [] Anemic Gastrointestinal:  [] Diarrhea   [] Vomiting  [] Gastroesophageal reflux/heartburn   [] Difficulty swallowing. Genitourinary:  [] Chronic kidney disease   [] Difficult urination  [] Frequent urination   [] Blood in urine Skin:  [] Rashes   [] Ulcers  Psychological:  [] History of anxiety   []  History of major depression.  Physical Examination  Vitals:   04/06/23 0952  BP: 117/65  Pulse: (!) 105  Resp: 18  Weight: 224 lb (101.6 kg)   Body mass index is 29.55 kg/m. Gen: WD/WN, NAD Head: /AT, No temporalis wasting.  Ear/Nose/Throat: Hearing grossly intact, nares w/o erythema or drainage Eyes: PER, EOMI, sclera nonicteric.  Neck: Supple, no masses.  No bruit or JVD.  Pulmonary:  Good air movement, no audible wheezing, no use of accessory muscles.  Cardiac: RRR, normal S1, S2, no Murmurs. Vascular: Bilateral above-knee amputations well-healed skin is supple, no open wounds.  Right carotid bruit noted Vessel Right Left  Radial Palpable Palpable  PT Status post AKA Status post AKA  DP Status post AKA Status post AKA  Gastrointestinal: soft, non-distended. No guarding/no peritoneal signs.  Musculoskeletal: M/S 5/5 throughout.  No visible deformity.  Neurologic: CN 2-12 intact. Pain and light touch intact in extremities.  Symmetrical.  Speech is fluent. Motor exam as listed above.  Psychiatric: Judgment intact, Mood & affect appropriate for pt's clinical situation. Dermatologic: No rashes or ulcers noted.  No changes consistent with cellulitis.   CBC Lab Results   Component Value Date   WBC 17.4 (H) 07/22/2021   HGB 10.8 (L) 07/22/2021   HCT 34.9 (L) 07/22/2021   MCV 80.2 07/22/2021   PLT 443 (H) 07/22/2021    BMET    Component Value Date/Time   NA 139 07/22/2021 1131   NA 136 12/01/2013 1317   K 4.5 07/22/2021 1131   K 3.9 12/01/2013 1317   CL 106 07/22/2021 1131   CL 100 12/01/2013 1317   CO2 27 07/22/2021 1131   CO2 33 (H) 12/01/2013 1317   GLUCOSE 127 (H) 07/22/2021 1131   GLUCOSE 91 12/01/2013 1317   BUN 15 07/22/2021 1131   BUN 24 (H) 09/25/2014 0846   CREATININE 1.09 07/22/2021 1131   CREATININE 1.29 09/25/2014 0846   CALCIUM 8.8 (L) 07/22/2021 1131   CALCIUM 9.4 12/01/2013 1317   GFRNONAA >60 07/22/2021 1131   GFRNONAA 60 (L) 09/25/2014 0846   GFRNONAA >60 03/16/2014 0750   GFRAA >60 04/30/2020 0650   GFRAA >60 09/25/2014 0846   GFRAA >60 03/16/2014 0750   CrCl cannot be calculated (Patient's most recent lab result is older than the maximum 21 days allowed.).  COAG Lab Results  Component Value Date   INR 1.1 03/21/2021   INR 1.2 04/27/2020   INR 1.1 09/29/2019    Radiology No results found.   Assessment/Plan 1. S/P AKA (above knee amputation) bilateral (HCC) Currently the patient is doing well his amputation sites are well-healed he is using an electric wheelchair.  2. Atherosclerosis of native artery of both lower extremities with rest pain (HCC)  Recommend:  The patient has evidence of atherosclerosis of the lower extremities status post bilateral above-knee amputations.  The patient does not voice lifestyle limiting changes at this point in time.  Physical examination does not suggest clinically significant change.  No invasive studies, angiography or surgery at this time The patient should continue an exercise program.   The patient should continue antiplatelet therapy and aggressive treatment of the lipid abnormalities  No changes in the patient's medications at this time  Continued surveillance  is indicated as atherosclerosis is likely to progress with time.    Patient wishes to continue follow-up on an annual basis.  3. Carotid bruit, unspecified laterality Recommend:  Given the patient's asymptomatic carotid stenosis no further invasive testing or surgery at this time.  Continue antiplatelet therapy as prescribed Continue management of CAD, HTN and Hyperlipidemia Healthy heart diet,  encouraged exercise at least 4 times per week.  Follow up in 12 months with duplex ultrasound and physical exam  - VAS US CAROTID; Future  4. COPD with chronic bronchitis Continue pulmonary medications and aerosols as already ordered, these medications have been reviewed and there are no changes at this time.   5. Essential hypertension Continue antihypertensive medications as already ordered, these medications have been reviewed and there are no changes at this time.  6. Hyperlipidemia, unspecified hyperlipidemia type Continue statin as ordered and reviewed, no changes at this time    Levora Dredge, MD  04/06/2023 10:10 AM

## 2023-04-07 ENCOUNTER — Inpatient Hospital Stay
Admission: EM | Admit: 2023-04-07 | Discharge: 2023-04-13 | DRG: 871 | Disposition: A | Discharge status: Skilled Nursing Facility | Payer: Medicare Other | Source: Skilled Nursing Facility | Attending: Internal Medicine | Admitting: Internal Medicine

## 2023-04-07 ENCOUNTER — Encounter: Payer: Self-pay | Admitting: Internal Medicine

## 2023-04-07 ENCOUNTER — Emergency Department: Payer: Medicare Other

## 2023-04-07 DIAGNOSIS — T380X5A Adverse effect of glucocorticoids and synthetic analogues, initial encounter: Secondary | ICD-10-CM | POA: Diagnosis present

## 2023-04-07 DIAGNOSIS — I5032 Chronic diastolic (congestive) heart failure: Secondary | ICD-10-CM | POA: Diagnosis present

## 2023-04-07 DIAGNOSIS — E785 Hyperlipidemia, unspecified: Secondary | ICD-10-CM | POA: Diagnosis present

## 2023-04-07 DIAGNOSIS — Z86718 Personal history of other venous thrombosis and embolism: Secondary | ICD-10-CM

## 2023-04-07 DIAGNOSIS — Z6834 Body mass index (BMI) 34.0-34.9, adult: Secondary | ICD-10-CM

## 2023-04-07 DIAGNOSIS — I82409 Acute embolism and thrombosis of unspecified deep veins of unspecified lower extremity: Secondary | ICD-10-CM | POA: Diagnosis present

## 2023-04-07 DIAGNOSIS — K219 Gastro-esophageal reflux disease without esophagitis: Secondary | ICD-10-CM | POA: Diagnosis present

## 2023-04-07 DIAGNOSIS — I70229 Atherosclerosis of native arteries of extremities with rest pain, unspecified extremity: Secondary | ICD-10-CM | POA: Diagnosis not present

## 2023-04-07 DIAGNOSIS — F32A Depression, unspecified: Secondary | ICD-10-CM | POA: Diagnosis present

## 2023-04-07 DIAGNOSIS — D72829 Elevated white blood cell count, unspecified: Secondary | ICD-10-CM | POA: Diagnosis present

## 2023-04-07 DIAGNOSIS — I10 Essential (primary) hypertension: Secondary | ICD-10-CM | POA: Diagnosis not present

## 2023-04-07 DIAGNOSIS — Z87891 Personal history of nicotine dependence: Secondary | ICD-10-CM | POA: Diagnosis not present

## 2023-04-07 DIAGNOSIS — Z89612 Acquired absence of left leg above knee: Secondary | ICD-10-CM | POA: Diagnosis not present

## 2023-04-07 DIAGNOSIS — Z8616 Personal history of COVID-19: Secondary | ICD-10-CM

## 2023-04-07 DIAGNOSIS — J9601 Acute respiratory failure with hypoxia: Secondary | ICD-10-CM | POA: Diagnosis present

## 2023-04-07 DIAGNOSIS — I110 Hypertensive heart disease with heart failure: Secondary | ICD-10-CM | POA: Diagnosis present

## 2023-04-07 DIAGNOSIS — Z789 Other specified health status: Secondary | ICD-10-CM | POA: Diagnosis present

## 2023-04-07 DIAGNOSIS — N400 Enlarged prostate without lower urinary tract symptoms: Secondary | ICD-10-CM | POA: Diagnosis present

## 2023-04-07 DIAGNOSIS — E876 Hypokalemia: Secondary | ICD-10-CM | POA: Diagnosis present

## 2023-04-07 DIAGNOSIS — J15212 Pneumonia due to Methicillin resistant Staphylococcus aureus: Secondary | ICD-10-CM | POA: Diagnosis present

## 2023-04-07 DIAGNOSIS — Z79899 Other long term (current) drug therapy: Secondary | ICD-10-CM

## 2023-04-07 DIAGNOSIS — I340 Nonrheumatic mitral (valve) insufficiency: Secondary | ICD-10-CM | POA: Diagnosis not present

## 2023-04-07 DIAGNOSIS — N179 Acute kidney failure, unspecified: Secondary | ICD-10-CM | POA: Diagnosis present

## 2023-04-07 DIAGNOSIS — U071 COVID-19: Secondary | ICD-10-CM | POA: Diagnosis present

## 2023-04-07 DIAGNOSIS — Z7982 Long term (current) use of aspirin: Secondary | ICD-10-CM

## 2023-04-07 DIAGNOSIS — A419 Sepsis, unspecified organism: Principal | ICD-10-CM | POA: Diagnosis present

## 2023-04-07 DIAGNOSIS — K2211 Ulcer of esophagus with bleeding: Secondary | ICD-10-CM | POA: Diagnosis present

## 2023-04-07 DIAGNOSIS — E878 Other disorders of electrolyte and fluid balance, not elsewhere classified: Secondary | ICD-10-CM | POA: Insufficient documentation

## 2023-04-07 DIAGNOSIS — Z7901 Long term (current) use of anticoagulants: Secondary | ICD-10-CM | POA: Diagnosis not present

## 2023-04-07 DIAGNOSIS — Z7951 Long term (current) use of inhaled steroids: Secondary | ICD-10-CM

## 2023-04-07 DIAGNOSIS — R9431 Abnormal electrocardiogram [ECG] [EKG]: Secondary | ICD-10-CM | POA: Diagnosis not present

## 2023-04-07 DIAGNOSIS — F419 Anxiety disorder, unspecified: Secondary | ICD-10-CM | POA: Diagnosis present

## 2023-04-07 DIAGNOSIS — Z8249 Family history of ischemic heart disease and other diseases of the circulatory system: Secondary | ICD-10-CM | POA: Diagnosis not present

## 2023-04-07 DIAGNOSIS — A4102 Sepsis due to Methicillin resistant Staphylococcus aureus: Secondary | ICD-10-CM | POA: Diagnosis not present

## 2023-04-07 DIAGNOSIS — K921 Melena: Secondary | ICD-10-CM | POA: Diagnosis present

## 2023-04-07 DIAGNOSIS — J440 Chronic obstructive pulmonary disease with acute lower respiratory infection: Secondary | ICD-10-CM | POA: Diagnosis present

## 2023-04-07 DIAGNOSIS — Z89611 Acquired absence of right leg above knee: Secondary | ICD-10-CM

## 2023-04-07 DIAGNOSIS — I5030 Unspecified diastolic (congestive) heart failure: Secondary | ICD-10-CM | POA: Diagnosis present

## 2023-04-07 DIAGNOSIS — R6520 Severe sepsis without septic shock: Secondary | ICD-10-CM | POA: Diagnosis present

## 2023-04-07 LAB — URINALYSIS, COMPLETE (UACMP) WITH MICROSCOPIC
Bacteria, UA: NONE SEEN
Bilirubin Urine: NEGATIVE
Glucose, UA: NEGATIVE mg/dL
Hgb urine dipstick: NEGATIVE
Ketones, ur: NEGATIVE mg/dL
Leukocytes,Ua: NEGATIVE
Nitrite: NEGATIVE
Protein, ur: 30 mg/dL — AB
Specific Gravity, Urine: 1.017 (ref 1.005–1.030)
pH: 5 (ref 5.0–8.0)

## 2023-04-07 LAB — CBC WITH DIFFERENTIAL/PLATELET
Abs Immature Granulocytes: 0.36 10*3/uL — ABNORMAL HIGH (ref 0.00–0.07)
Basophils Absolute: 0.2 10*3/uL — ABNORMAL HIGH (ref 0.0–0.1)
Basophils Relative: 1 %
Eosinophils Absolute: 0 10*3/uL (ref 0.0–0.5)
Eosinophils Relative: 0 %
HCT: 48.7 % (ref 39.0–52.0)
Hemoglobin: 16.2 g/dL (ref 13.0–17.0)
Immature Granulocytes: 2 %
Lymphocytes Relative: 4 %
Lymphs Abs: 0.8 10*3/uL (ref 0.7–4.0)
MCH: 26.3 pg (ref 26.0–34.0)
MCHC: 33.3 g/dL (ref 30.0–36.0)
MCV: 79.1 fL — ABNORMAL LOW (ref 80.0–100.0)
Monocytes Absolute: 1.6 10*3/uL — ABNORMAL HIGH (ref 0.1–1.0)
Monocytes Relative: 6 %
Neutro Abs: 21.4 10*3/uL — ABNORMAL HIGH (ref 1.7–7.7)
Neutrophils Relative %: 87 %
Platelets: 159 10*3/uL (ref 150–400)
RBC: 6.16 MIL/uL — ABNORMAL HIGH (ref 4.22–5.81)
RDW: 14.9 % (ref 11.5–15.5)
Smear Review: NORMAL
WBC: 24.3 10*3/uL — ABNORMAL HIGH (ref 4.0–10.5)
nRBC: 0 % (ref 0.0–0.2)

## 2023-04-07 LAB — COMPREHENSIVE METABOLIC PANEL
ALT: 25 U/L (ref 0–44)
AST: 46 U/L — ABNORMAL HIGH (ref 15–41)
Albumin: 2.8 g/dL — ABNORMAL LOW (ref 3.5–5.0)
Alkaline Phosphatase: 66 U/L (ref 38–126)
Anion gap: 16 — ABNORMAL HIGH (ref 5–15)
BUN: 39 mg/dL — ABNORMAL HIGH (ref 8–23)
CO2: 21 mmol/L — ABNORMAL LOW (ref 22–32)
Calcium: 8.1 mg/dL — ABNORMAL LOW (ref 8.9–10.3)
Chloride: 95 mmol/L — ABNORMAL LOW (ref 98–111)
Creatinine, Ser: 1.99 mg/dL — ABNORMAL HIGH (ref 0.61–1.24)
GFR, Estimated: 35 mL/min — ABNORMAL LOW (ref >60)
Glucose, Bld: 238 mg/dL — ABNORMAL HIGH (ref 70–99)
Potassium: 3.8 mmol/L (ref 3.5–5.1)
Sodium: 132 mmol/L — ABNORMAL LOW (ref 135–145)
Total Bilirubin: 3.7 mg/dL — ABNORMAL HIGH (ref 0.3–1.2)
Total Protein: 6.8 g/dL (ref 6.5–8.1)

## 2023-04-07 LAB — APTT: aPTT: 40 seconds — ABNORMAL HIGH (ref 24–36)

## 2023-04-07 LAB — BLOOD GAS, VENOUS

## 2023-04-07 LAB — BRAIN NATRIURETIC PEPTIDE: B Natriuretic Peptide: 262.1 pg/mL — ABNORMAL HIGH (ref 0.0–100.0)

## 2023-04-07 LAB — TROPONIN I (HIGH SENSITIVITY)
Troponin I (High Sensitivity): 34 ng/L — ABNORMAL HIGH (ref <18)
Troponin I (High Sensitivity): 32 ng/L — ABNORMAL HIGH (ref <18)

## 2023-04-07 LAB — PROCALCITONIN: Procalcitonin: 47.15 ng/mL

## 2023-04-07 LAB — PROTIME-INR
INR: 2.1 — ABNORMAL HIGH (ref 0.8–1.2)
Prothrombin Time: 23.7 seconds — ABNORMAL HIGH (ref 11.4–15.2)

## 2023-04-07 LAB — SARS CORONAVIRUS 2 BY RT PCR: SARS Coronavirus 2 by RT PCR: POSITIVE — AB

## 2023-04-07 LAB — LACTIC ACID, PLASMA
Lactic Acid, Venous: 3.8 mmol/L (ref 0.5–1.9)
Lactic Acid, Venous: 3.8 mmol/L (ref 0.5–1.9)
Lactic Acid, Venous: 3.1 mmol/L (ref 0.5–1.9)

## 2023-04-07 LAB — HEPARIN LEVEL (UNFRACTIONATED): Heparin Unfractionated: >1.1 [IU]/mL — ABNORMAL HIGH (ref 0.30–0.70)

## 2023-04-07 MED ORDER — SODIUM CHLORIDE 0.9 % IV SOLN
2.0000 g | Freq: Two times a day (BID) | INTRAVENOUS | Status: DC
  Administered 2023-04-08 – 2023-04-10 (×5): 2 g via INTRAVENOUS
  Filled 2023-04-07 (×7): qty 12.5

## 2023-04-07 MED ORDER — ONDANSETRON HCL 4 MG/2ML IJ SOLN: 4.0000 mg | Freq: Four times a day (QID) | INTRAMUSCULAR | Status: DC | PRN

## 2023-04-07 MED ORDER — SODIUM CHLORIDE 0.9 % IV BOLUS
1000.0000 mL | Freq: Once | INTRAVENOUS | Status: AC
  Administered 2023-04-07: 1000 mL via INTRAVENOUS

## 2023-04-07 MED ORDER — HEPARIN (PORCINE) 25000 UT/250ML-% IV SOLN
1700.0000 [IU]/h | INTRAVENOUS | Status: DC
  Administered 2023-04-07: 1500 [IU]/h via INTRAVENOUS
  Filled 2023-04-07: qty 250

## 2023-04-07 MED ORDER — SODIUM CHLORIDE 0.9 % IV BOLUS (SEPSIS)
2000.0000 mL | Freq: Once | INTRAVENOUS | Status: AC
  Administered 2023-04-07: 2000 mL via INTRAVENOUS

## 2023-04-07 MED ORDER — POLYETHYLENE GLYCOL 3350 17 G PO PACK: 17.0000 g | PACK | Freq: Two times a day (BID) | ORAL | Status: AC | PRN

## 2023-04-07 MED ORDER — METHYLPREDNISOLONE SODIUM SUCC 125 MG IJ SOLR
125.0000 mg | Freq: Every day | INTRAMUSCULAR | Status: AC
  Administered 2023-04-08 – 2023-04-09 (×2): 125 mg via INTRAVENOUS
  Filled 2023-04-07 (×2): qty 2

## 2023-04-07 MED ORDER — HYDRALAZINE HCL 20 MG/ML IJ SOLN: 5.0000 mg | Freq: Three times a day (TID) | INTRAMUSCULAR | Status: DC | PRN

## 2023-04-07 MED ORDER — ACETAMINOPHEN 650 MG RE SUPP: 650.0000 mg | Freq: Four times a day (QID) | RECTAL | Status: AC | PRN

## 2023-04-07 MED ORDER — SODIUM CHLORIDE 0.9 % IV SOLN: 500.0000 mg | INTRAVENOUS | Status: DC

## 2023-04-07 MED ORDER — ONDANSETRON HCL 4 MG PO TABS: 4.0000 mg | ORAL_TABLET | Freq: Four times a day (QID) | ORAL | Status: DC | PRN

## 2023-04-07 MED ORDER — SODIUM CHLORIDE 0.9 % IV SOLN
2.0000 g | Freq: Once | INTRAVENOUS | Status: AC
  Administered 2023-04-07: 2 g via INTRAVENOUS
  Filled 2023-04-07: qty 12.5

## 2023-04-07 MED ORDER — VANCOMYCIN HCL 2000 MG/400ML IV SOLN
2000.0000 mg | Freq: Once | INTRAVENOUS | Status: AC
  Administered 2023-04-07: 2000 mg via INTRAVENOUS
  Filled 2023-04-07: qty 400

## 2023-04-07 MED ORDER — ACETAMINOPHEN 325 MG PO TABS
650.0000 mg | ORAL_TABLET | Freq: Four times a day (QID) | ORAL | Status: AC | PRN
  Administered 2023-04-09: 650 mg via ORAL
  Filled 2023-04-07 (×2): qty 2

## 2023-04-07 MED ORDER — SODIUM CHLORIDE 0.9 % IV SOLN
500.0000 mg | Freq: Once | INTRAVENOUS | Status: AC
  Administered 2023-04-07: 500 mg via INTRAVENOUS
  Filled 2023-04-07: qty 5

## 2023-04-07 MED ORDER — APIXABAN 2.5 MG PO TABS: 2.5000 mg | ORAL_TABLET | Freq: Two times a day (BID) | ORAL | Status: DC

## 2023-04-07 MED ORDER — SENNOSIDES-DOCUSATE SODIUM 8.6-50 MG PO TABS
1.0000 | ORAL_TABLET | Freq: Every evening | ORAL | Status: DC | PRN
  Administered 2023-04-09: 1 via ORAL
  Filled 2023-04-07: qty 1

## 2023-04-07 MED ORDER — SODIUM CHLORIDE 0.9 % IV SOLN
100.0000 mg | Freq: Two times a day (BID) | INTRAVENOUS | Status: DC
  Administered 2023-04-08 – 2023-04-10 (×5): 100 mg via INTRAVENOUS
  Filled 2023-04-07 (×7): qty 100

## 2023-04-07 MED ORDER — LACTATED RINGERS IV SOLN
150.0000 mL/h | INTRAVENOUS | Status: AC
  Administered 2023-04-07 – 2023-04-08 (×3): 150 mL/h via INTRAVENOUS

## 2023-04-07 MED ORDER — HEPARIN SODIUM (PORCINE) 5000 UNIT/ML IJ SOLN: 5000.0000 [IU] | Freq: Three times a day (TID) | INTRAMUSCULAR | Status: DC

## 2023-04-07 MED ORDER — METOPROLOL TARTRATE 5 MG/5ML IV SOLN: 5.0000 mg | INTRAVENOUS | Status: DC | PRN

## 2023-04-07 NOTE — H&P (Addendum)
History and Physical   [name] VWU:[id number] DOB: 06/07/1951 DOA: 04/07/2023  PCP: Dale [place], MD  Patient coming from: [facility]  I have personally briefly reviewed patient's old medical records in [facility] EMR.  Chief Concern: Short of breath  HPI: Timothy Juarez is a 72 year old male with history of bilateral AKA, morbid obesity, GERD, BPH, neuropathy, hyperlipidemia, depression, anxiety, peripheral atherosclerosis of the extremity arteries, insomnia, who presents to the emergency department from [facility] for chief concerns of shortness of breath.  Of note, patient may have been hypoxic overnight.  Per ED documentation, patient was said to have SpO2 of 87% on room air.  EMS gave patient DuoNebs x 3, 2 g of magnesium, Solu-Medrol 125 mg IV one-time dose and patient on CPAP.  Vitals in the ED showed temperature of 97.8, respiration rate of 30, heart rate of 130, blood pressure 96/65, SpO2 97% on BiPAP.  VBG showed 7.37/CO2 of 39, O2 was pending.  Serum sodium is 132, potassium 3.8, chloride 95, bicarb 21, BUN of 39, serum creatinine 1.99, eGFR 35, nonfasting blood glucose 238, WBC 24.3, hemoglobin 16.2, platelets of 159.  High sensitive troponin was 34.  ED treatment: Sodium chloride 1 L bolus, cefepime, azithromycin. ------------------------------ At bedside, patient was opening her eyes spontaneously.  Nods to his name.  He nods his head stating that he feels a little bit better.  BiPAP setting I/E of 14/6, RR 20.  Social history: Patient is from [facility] facility.  ROS: Unable to obtain further as patient is on BiPAP and acutely ill  ED Course: Discussed with emergency medicine provider, patient requiring hospitalization for chief concerns of severe sepsis.  Assessment/Plan  Principal Problem:   Severe sepsis with acute organ dysfunction (HCC) Active Problems:   Hyperlipidemia   Recurrent deep vein thrombosis (DVT) (HCC)   Essential  hypertension   COVID-19 virus infection   AKI (acute kidney injury) (HCC)   Acute respiratory failure with hypoxia (HCC)   History of COVID-19   (HFpEF) heart failure with preserved ejection fraction (HCC)   Chronic anticoagulation with eliquis   Leukocytosis   Alcohol use   Esophageal ulcer with bleeding   Atherosclerosis of artery of extremity with rest pain (HCC)   S/P AKA (above knee amputation) bilateral (HCC)   Morbid obesity (HCC)   Assessment and Plan:  * Severe sepsis with acute organ dysfunction (HCC) With multiple organ involvement, renal and pulmonary Etiology workup in progress, suspect secondary to community-acquired pneumonia/MRSA pneumonia given with wedge shaped consolidation on chest x-ray and viral infection (COVID PCR +) Check MRSA PCR, UA Vancomycin per pharmacy, cefepime per pharmacy Change azithromycin to doxycycline 100 mg IV twice daily for atypical coverage in setting of prolonged QTc Ordered additional sodium chloride 2 L on admission to complete sepsis bolus Lactated ringer's infusion at 150 mL/h Continue BiPAP Admit to stepdown, inpatient  Morbid obesity (HCC) This complicates overall care and prognosis.   Leukocytosis Suspect secondary to community acquired pneumonia  (HFpEF) heart failure with preserved ejection fraction (HCC) Strict I's and O's  Acute respiratory failure with hypoxia (HCC) Secondary to community-acquired pneumonia, MRSA pneumonia Continue BiPAP  AKI (acute kidney injury) (HCC) Suspect secondary to severe sepsis, patient is status post sodium chloride 1 L bolus per EDP Will order sodium chloride 2 L bolus to complete sepsis bolus on admission Recheck BMP in a.m.  COVID-19 virus infection Airborne and contact precaution, symptomatic support Patient received Solu-Medrol with EMS at 125 mg IV  prior to ED presentation Solu-Medrol 125 mg IV daily, 2 doses ordered for 04/08/2023  Essential hypertension Hydralazine 5 mg IV  every 8 hours as needed for SBP greater than 180, 2 days ordered  Recurrent deep vein thrombosis (DVT) (HCC) Patient is on Eliquis 2.5 mg p.o. twice daily, however patient does not appear to be reliably swallowing medication at this time given patient is on BiPAP We will hold Eliquis 2.5 mg p.o. on admission Heparin per pharmacy ordered  Chart reviewed.   DVT prophylaxis: Heparin per pharmacy Code Status: Full code Diet: N.p.o. except for sips with meds and ice chips as patient is on BiPAP Family Communication: Attempted to call daughter, Chrisopher Housholder at [phone], no pickup Disposition Plan: Guarded prognosis Consults called: None at this time, low clinical threshold to consult ICU as patient is high risk for intubation Admission status: Stepdown, inpatient  Past Medical History:  Diagnosis Date   Acute embolism and thombos unsp deep vn unsp lower extremity (HCC)    Acute respiratory failure (HCC)    AKI (acute kidney injury) (HCC) 12/27/2018   Allergy    Anemia    Aneurysm of unspecified site (HCC)    ARF (acute respiratory failure) (HCC)    H/O   Bronchitis    Cellulitis and abscess of leg 09/27/2019   CHF (congestive heart failure) (HCC)    COPD (chronic obstructive pulmonary disease) (HCC)    Cough    Epistaxis    Gastrointestinal hemorrhage    GERD (gastroesophageal reflux disease)    Gout    Hematemesis 04/27/2020   High anion gap metabolic acidosis 12/27/2018   Hyperlipidemia    Hypertension    Hypokalemia    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) 09/27/2019   Insomnia    Iron deficiency anemia 04/21/2017   Muscle contracture    MUSCLE SPASMS, muscle weakness   Peripheral vascular disease (HCC)    Pneumonia    Pressure ulcer    Severe sepsis (HCC) 12/27/2018   Toxic encephalopathy 12/27/2018   Past Surgical History:  Procedure Laterality Date   AMPUTATION Left 09/19/2015   Procedure: AMPUTATION BELOW KNEE;  Surgeon: Annice Needy, MD;   Location: [facility] ORS;  Service: Vascular;  Laterality: Left;   AMPUTATION Left 11/01/2015   Procedure: AMPUTATION ABOVE KNEE;  Surgeon: Annice Needy, MD;  Location: [facility];  Service: Vascular;  Laterality: Left;   AMPUTATION Left 09/29/2019   Procedure: IRRIGATION AND DEBRIDEMENT OF LEFT AKA;  Surgeon: Annice Needy, MD;  Location: [facility];  Service: General;  Laterality: Left;   AMPUTATION Right 07/18/2021   Procedure: AMPUTATION ABOVE KNEE;  Surgeon: Annice Needy, MD;  Location: [facility];  Service: Vascular;  Laterality: Right;   APPLICATION OF WOUND VAC Left 10/18/2015   Procedure: APPLICATION OF WOUND VAC;  Surgeon: Annice Needy, MD;  Location: [facility];  Service: Vascular;  Laterality: Left;   APPLICATION OF WOUND VAC Left 09/30/2019   Procedure: APPLICATION OF WOUND VAC;  Surgeon: Renford Dills, MD;  Location: [facility];  Service: Vascular;  Laterality: Left;  serial # [id number]   CARDIAC CATHETERIZATION     CATARACT EXTRACTION W/PHACO Right 08/18/2019   Procedure: CATARACT EXTRACTION PHACO AND INTRAOCULAR LENS PLACEMENT (IOC) RIGHT;  Surgeon: Galen Manila, MD;  Location: [facility];  Service: Ophthalmology;  Laterality: Right;  [place] 03:29.0 CDE 45.68 Fluid Pack Lot # [id number]   CENTRAL VENOUS CATHETER INSERTION Right 09/30/2019   Procedure: INSERTION CENTRAL LINE ADULT;  Surgeon: Renford Dills, MD;  Location: [facility];  Service: Vascular;  Laterality: Right;   COLONOSCOPY WITH PROPOFOL N/A 05/25/2017   Procedure: COLONOSCOPY WITH PROPOFOL;  Surgeon: Wyline Mood, MD;  Location: [facility] ENDOSCOPY;  Service: Gastroenterology;  Laterality: N/A;   COLONOSCOPY WITH PROPOFOL N/A 10/14/2017   Procedure: COLONOSCOPY WITH PROPOFOL;  Surgeon: Wyline Mood, MD;  Location: [facility] ENDOSCOPY;  Service: Gastroenterology;  Laterality: N/A;   ESOPHAGOGASTRODUODENOSCOPY (EGD) WITH PROPOFOL N/A 05/25/2017   Procedure: ESOPHAGOGASTRODUODENOSCOPY (EGD) WITH PROPOFOL;  Surgeon: Wyline Mood, MD;  Location: [facility]  ENDOSCOPY;  Service: Gastroenterology;  Laterality: N/A;   ESOPHAGOGASTRODUODENOSCOPY (EGD) WITH PROPOFOL N/A 10/14/2017   Procedure: ESOPHAGOGASTRODUODENOSCOPY (EGD) WITH PROPOFOL;  Surgeon: Wyline Mood, MD;  Location: [facility] ENDOSCOPY;  Service: Gastroenterology;  Laterality: N/A;   ESOPHAGOGASTRODUODENOSCOPY (EGD) WITH PROPOFOL N/A 04/27/2020   Procedure: ESOPHAGOGASTRODUODENOSCOPY (EGD) WITH PROPOFOL;  Surgeon: Toney Reil, MD;  Location: [facility] ENDOSCOPY;  Service: Gastroenterology;  Laterality: N/A;   ESOPHAGOGASTRODUODENOSCOPY (EGD) WITH PROPOFOL N/A 06/18/2020   Procedure: ESOPHAGOGASTRODUODENOSCOPY (EGD) WITH PROPOFOL;  Surgeon: Toney Reil, MD;  Location: [facility] ENDOSCOPY;  Service: Gastroenterology;  Laterality: N/A;   EYE SURGERY     GIVENS CAPSULE STUDY N/A 07/14/2017   Procedure: GIVENS CAPSULE STUDY;  Surgeon: Wyline Mood, MD;  Location: [facility] ENDOSCOPY;  Service: Gastroenterology;  Laterality: N/A;   GIVENS CAPSULE STUDY N/A 10/30/2017   Procedure: GIVENS CAPSULE STUDY 12 HR;  Surgeon: Wyline Mood, MD;  Location: [facility] ENDOSCOPY;  Service: Gastroenterology;  Laterality: N/A;   IVC FILTER INSERTION N/A 10/12/2017   Procedure: IVC FILTER INSERTION;  Surgeon: Annice Needy, MD;  Location: [facility] INVASIVE CV LAB;  Service: Cardiovascular;  Laterality: N/A;   LOWER EXTREMITY ANGIOGRAPHY Right 10/04/2018   Procedure: LOWER EXTREMITY ANGIOGRAPHY;  Surgeon: Annice Needy, MD;  Location: [facility] INVASIVE CV LAB;  Service: Cardiovascular;  Laterality: Right;   LOWER EXTREMITY ANGIOGRAPHY Right 03/21/2021   Procedure: LOWER EXTREMITY ANGIOGRAPHY;  Surgeon: Annice Needy, MD;  Location: [facility];  Service: Cardiovascular;  Laterality: Right;   LOWER EXTREMITY ANGIOGRAPHY Right 05/23/2021   Procedure: LOWER EXTREMITY ANGIOGRAPHY;  Surgeon: Annice Needy, MD;  Location: [facility];  Service: Cardiovascular;  Laterality: Right;   PERIPHERAL VASCULAR CATHETERIZATION Left 01/18/2015    Procedure: Lower Extremity Angiography;  Surgeon: Annice Needy, MD;  Location: [facility];  Service: Cardiovascular;  Laterality: Left;   PERIPHERAL VASCULAR CATHETERIZATION N/A 01/18/2015   Procedure: Lower Extremity Intervention;  Surgeon: Annice Needy, MD;  Location: [facility];  Service: Cardiovascular;  Laterality: N/A;   PERIPHERAL VASCULAR CATHETERIZATION  07/30/2015   Procedure: Lower Extremity Intervention;  Surgeon: Annice Needy, MD;  Location: [facility] LAB;  Service: Cardiovascular;;   PERIPHERAL VASCULAR CATHETERIZATION N/A 07/30/2015   Procedure: Abdominal Aortogram w/Lower Extremity;  Surgeon: Annice Needy, MD;  Location: [facility];  Service: Cardiovascular;  Laterality: N/A;   PERIPHERAL VASCULAR CATHETERIZATION Left 08/22/2015   Procedure: Lower Extremity Angiography;  Surgeon: Annice Needy, MD;  Location: [facility];  Service: Cardiovascular;  Laterality: Left;   PERIPHERAL VASCULAR CATHETERIZATION Left 08/22/2015   Procedure: Lower Extremity Intervention;  Surgeon: Annice Needy, MD;  Location: [facility];  Service: Cardiovascular;  Laterality: Left;   PERIPHERAL VASCULAR CATHETERIZATION Right 03/31/2016   Procedure: Lower Extremity Angiography;  Surgeon: Annice Needy, MD;  Location: [facility];  Service: Cardiovascular;  Laterality: Right;  PERIPHERAL VASCULAR CATHETERIZATION  03/31/2016   Procedure: Lower Extremity Intervention;  Surgeon: Annice Needy, MD;  Location: [facility];  Service: Cardiovascular;;   PERIPHERAL VASCULAR CATHETERIZATION Left 04/10/2016   Procedure: Lower Extremity Angiography;  Surgeon: Annice Needy, MD;  Location: [facility];  Service: Cardiovascular;  Laterality: Left;   VACUUM ASSISTED CLOSURE CHANGE Left 10/03/2019   Procedure: LEFT THIGH VACUUM ASSISTED CLOSURE CHANGE;  Surgeon: Annice Needy, MD;  Location: [facility];  Service: General;  Laterality: Left;   WOUND DEBRIDEMENT Left  10/18/2015   Procedure: DEBRIDEMENT WOUND   ( LEFT BKA DEBRIDEMENT );  Surgeon: Annice Needy, MD;  Location: [facility];  Service: Vascular;  Laterality: Left;   Social History:  reports that he quit smoking about 10 years ago. His smoking use included cigarettes. He started smoking about 54 years ago. He has a 44 pack-year smoking history. He has never used smokeless tobacco. He reports that he does not currently use alcohol. He reports that he does not use drugs.  No Known Allergies Family History  Problem Relation Age of Onset   Heart attack Mother    Varicose Veins Neg Hx    Family history: Family history reviewed and not pertinent.  Prior to Admission medications   Medication Sig Start Date End Date Taking? Authorizing Provider  acetaminophen (TYLENOL) 325 MG tablet Take 650 mg by mouth every 4 (four) hours as needed for fever.     [provider]  acidophilus (RISAQUAD) CAPS capsule Take 1 capsule by mouth 2 (two) times daily.    [provider]  ALPHAGAN P 0.1 % SOLN Apply to eye. 03/14/23   [provider]  apixaban (ELIQUIS) 2.5 MG TABS tablet Take 1 tablet (2.5 mg total) by mouth 2 (two) times daily. 05/10/20   Rickard Patience, MD  ascorbic acid (VITAMIN C) 500 MG tablet Take 500 mg by mouth 2 (two) times daily.    [provider]  aspirin EC 81 MG EC tablet Take 1 tablet (81 mg total) by mouth daily. Swallow whole. 03/23/21   Stegmayer, Cala Bradford A, PA-C  atorvastatin (LIPITOR) 10 MG tablet Take 1 tablet (10 mg total) by mouth daily. 10/04/18   Annice Needy, MD  baclofen (LIORESAL) 10 MG tablet Take 10 mg by mouth 3 (three) times daily. Hold for sedation    [provider]  brimonidine-timolol (COMBIGAN) 0.2-0.5 % ophthalmic solution Place 1 drop into the right eye 2 (two) times daily.    [provider]  Cholecalciferol (VITAMIN D3) 50 MCG (2000 UT) TABS Take by mouth. 01/15/23   [provider]  docusate sodium (COLACE) 100 MG capsule  Take 100 mg by mouth daily as needed for mild constipation.    [provider]  DULoxetine (CYMBALTA) 20 MG capsule Take 20 mg by mouth 2 (two) times daily. 04/02/23   [provider]  fentaNYL (DURAGESIC) 25 MCG/HR Place 1 patch onto the skin every 3 (three) days.  10/13/19   [provider]  ferrous sulfate 324 (65 Fe) MG TBEC Take 324 mg by mouth 2 (two) times daily.     [provider]  Fluticasone-Umeclidin-Vilant 100-62.5-25 MCG/ACT AEPB Inhale 1 puff into the lungs every morning.    [provider]  furosemide (LASIX) 40 MG tablet Take 40 mg by mouth daily.    [provider]  gabapentin (NEURONTIN) 100 MG capsule Take 100 mg by mouth at bedtime.  [provider]  GOODSENSE ARTIFICIAL TEARS 0.5-0.6 % SOLN Apply to eye. 12/11/22   [provider]  guaiFENesin (ROBITUSSIN) 100 MG/5ML SOLN Take 15 mLs by mouth 3 (three) times daily as needed for cough (congestion).    [provider]  ketorolac (ACULAR) 0.5 % ophthalmic solution Place 1 drop into the left eye 2 (two) times daily. 03/15/21   [provider]  loperamide (IMODIUM) 2 MG capsule Take 2 mg by mouth 3 (three) times daily as needed for diarrhea or loose stools.    [provider]  nitroGLYCERIN (NITROSTAT) 0.4 MG SL tablet Place 0.14 mg under the tongue every 5 (five) minutes as needed for chest pain.    [provider]  Omega-3 Fatty Acids (FISH OIL) 1000 MG CAPS Take 1,000 mg by mouth daily.     [provider]  Oxycodone HCl 10 MG TABS Take 10 mg by mouth every 4 (four) hours as needed (severe pain).    [provider]  oxymetazoline (AFRIN) 0.05 % nasal spray Place 2 sprays into both nostrils 2 (two) times daily as needed (For nose bleeds).    [provider]  pantoprazole (PROTONIX) 40 MG tablet Take 1 tablet (40 mg total) by mouth 2 (two) times daily. 04/30/20   Charise Killian, MD  potassium  chloride SA (K-DUR,KLOR-CON) 20 MEQ tablet Take 20 mEq by mouth daily.    [provider]  tamsulosin (FLOMAX) 0.4 MG CAPS capsule Take 1 capsule (0.4 mg total) by mouth daily. 10/06/19   Delfino Lovett, MD  timolol (TIMOPTIC) 0.5 % ophthalmic solution SMARTSIG:In Eye(s) 02/16/23   [provider]   Physical Exam: Vitals:   04/07/23 1830 04/07/23 1900 04/07/23 2000 04/07/23 2100  BP: (!) 111/57 94/75 (!) 121/55 (!) 122/51  Pulse: (!) 117 (!) 120 (!) 119 (!) 119  Resp: (!) 31 (!) 33 (!) 31 (!) 31  Temp:      TempSrc:      SpO2: 100% 99% 96% 99%  Weight:      Height:       Constitutional: appears chronically ill, frail, acutely ill Eyes: PERRL on the left eye.  Right eye appears cloudy, lids and conjunctivae normal ENMT: Mucous membranes are dry.  Unable to assess posterior pharynx as BiPAP mask is in place.  Unable to assess dentition.  Unable to assess hearing Neck: normal, supple, no masses, no thyromegaly Respiratory: Generalized decreased lung sounds bilaterally, no wheezing.  Crackles on the right lung.  Normal respiratory effort with BiPAP mask in place.  Increased accessory muscle use.  Cardiovascular: Tachycardic rate and regular rhythm, no murmurs / rubs / gallops. No extremity edema. 2+ pedal pulses. No carotid bruits.  Abdomen: Morbidly obese abdomen, no tenderness, no masses palpated, no hepatosplenomegaly. Bowel sounds positive.  Musculoskeletal: no clubbing / cyanosis. No joint deformity upper extremities.  Bilateral AKA.  Unable to assess muscle tone. Skin: no rashes, lesions, ulcers visible. No induration Neurologic: Sensation intact. Strength 5/5 in all 4.  Psychiatric: Unable to assess judgment, insight, alertness, orientation.  Unable to assess mood.    EKG: independently reviewed, showing sinus tachycardia with rate of 128, QTc 571  Chest x-ray on Admission: I personally reviewed and I agree with radiologist reading as below.  DG Chest Portable 1  View  Result Date: 04/07/2023 CLINICAL DATA:  Shortness of breath EXAM: PORTABLE CHEST 1 VIEW COMPARISON:  Chest radiograph dated 04/26/2020, CT chest dated 04/27/2020 FINDINGS: Normal lung volumes. Right upper lung  consolidation. Left upper lung linear scarring. No pleural effusion or pneumothorax. The heart size and mediastinal contours are within normal limits. No acute osseous abnormality. IMPRESSION: Right upper lung consolidation, suspicious for pneumonia in the acute clinical setting. Electronically Signed   By: Agustin Cree M.D.   On: 04/07/2023 16:51    Labs on Admission: I have personally reviewed following labs  CBC: Recent Labs  Lab 04/07/23 1515  WBC 24.3*  NEUTROABS 21.4*  HGB 16.2  HCT 48.7  MCV 79.1*  PLT 159   Basic Metabolic Panel: Recent Labs  Lab 04/07/23 1515  NA 132*  K 3.8  CL 95*  CO2 21*  GLUCOSE 238*  BUN 39*  CREATININE 1.99*  CALCIUM 8.1*   GFR: Estimated Creatinine Clearance: 36.6 mL/min (A) (by C-G formula based on SCr of 1.99 mg/dL (H)).  Liver Function Tests: Recent Labs  Lab 04/07/23 1515  AST 46*  ALT 25  ALKPHOS 66  BILITOT 3.7*  PROT 6.8  ALBUMIN 2.8*   Urine analysis:    Component Value Date/Time   COLORURINE AMBER (A) 04/07/2023 1642   APPEARANCEUR HAZY (A) 04/07/2023 1642   APPEARANCEUR Clear 11/29/2012 1529   LABSPEC 1.017 04/07/2023 1642   LABSPEC 1.017 11/29/2012 1529   PHURINE 5.0 04/07/2023 1642   GLUCOSEU NEGATIVE 04/07/2023 1642   GLUCOSEU Negative 11/29/2012 1529   HGBUR NEGATIVE 04/07/2023 1642   BILIRUBINUR NEGATIVE 04/07/2023 1642   BILIRUBINUR Negative 11/29/2012 1529   KETONESUR NEGATIVE 04/07/2023 1642   PROTEINUR 30 (A) 04/07/2023 1642   NITRITE NEGATIVE 04/07/2023 1642   LEUKOCYTESUR NEGATIVE 04/07/2023 1642   LEUKOCYTESUR 1+ 11/29/2012 1529   CRITICAL CARE Performed by: Dr. Sedalia Muta  Total critical care time: 35 minutes  Critical care time was exclusive of separately billable procedures and treating  other patients.  Critical care was necessary to treat or prevent imminent or life-threatening deterioration.  Critical care was time spent personally by me on the following activities: development of treatment plan with patient and/or surrogate as well as nursing, discussions with consultants, evaluation of patient's response to treatment, examination of patient, obtaining history from patient or surrogate, ordering and performing treatments and interventions, ordering and review of laboratory studies, ordering and review of radiographic studies, pulse oximetry and re-evaluation of patient's condition.  This document was prepared using Dragon Voice Recognition software and may include unintentional dictation errors.  Dr. Sedalia Muta [facility]  If 7PM-7AM, please contact overnight-coverage provider If 7AM-7PM, please contact day attending provider [email or web address]  04/07/2023, 9:28 PM

## 2023-04-07 NOTE — Assessment & Plan Note (Signed)
Strict I's and O's

## 2023-04-07 NOTE — Consult Note (Addendum)
ANTICOAGULATION CONSULT NOTE  Pharmacy Consult for Heparin Infusion Indication:  history of DVT  No Known Allergies  Patient Measurements: Height: 5\' 6"  (167.6 cm) Weight: 97.4 kg (214 lb 11.2 oz) IBW/kg (Calculated) : 63.8 Heparin Dosing Weight: 85 kg  Vital Signs: Temp: 97.8 F (36.6 C) (08/20 1509) Temp Source: Oral (08/20 1509) BP: 119/54 (08/20 1730) Pulse Rate: 111 (08/20 1730)  Labs: Recent Labs    04/07/23 1515  HGB 16.2  HCT 48.7  PLT 159  CREATININE 1.99*  TROPONINIHS 34*    Estimated Creatinine Clearance: 36.6 mL/min (A) (by C-G formula based on SCr of 1.99 mg/dL (H)).   Medical History: Past Medical History:  Diagnosis Date   Acute embolism and thombos unsp deep vn unsp lower extremity (HCC)    Acute respiratory failure (HCC)    AKI (acute kidney injury) (HCC) 12/27/2018   Allergy    Anemia    Aneurysm of unspecified site (HCC)    ARF (acute respiratory failure) (HCC)    H/O   Bronchitis    Cellulitis and abscess of leg 09/27/2019   CHF (congestive heart failure) (HCC)    COPD (chronic obstructive pulmonary disease) (HCC)    Cough    Epistaxis    Gastrointestinal hemorrhage    GERD (gastroesophageal reflux disease)    Gout    Hematemesis 04/27/2020   High anion gap metabolic acidosis 12/27/2018   Hyperlipidemia    Hypertension    Hypokalemia    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) 09/27/2019   Insomnia    Iron deficiency anemia 04/21/2017   Muscle contracture    MUSCLE SPASMS, muscle weakness   Peripheral vascular disease (HCC)    Pneumonia    Pressure ulcer    Severe sepsis (HCC) 12/27/2018   Toxic encephalopathy 12/27/2018    Pertinent Medications:  Apixaban 2.5mg  BID, last dose: patient believes he took it this morning(~0800 on 8/20), Called [facility] but no one was available to assist.  Assessment: 72 year old male with history of bilateral AKA, morbid obesity, GERD, BPH, neuropathy, hyperlipidemia, depression,  anxiety, peripheral atherosclerosis of the extremity arteries, insomnia, who presents to the emergency department from [facility] for chief concerns of shortness of breath. Patient takes Apixaban at home for history of DVT. Currently, he is having trouble swallowing. Pharmacy has been consulted to initiate heparin infusion. Baseline labs have been ordered and are pending.  Goal of Therapy:  Heparin level 0.3-0.7 units/ml Heparin level 66-102 units/ml Monitor platelets by anticoagulation protocol: Yes   Plan:  Given recent dose of apixaban, will defer bolus upon initiation of infusion Start heparin infusion at 1500 units/hr Check aPTT level in 8 hours and anti-Xa level daily while on heparin Will continue to dose via aPTT until both levels correlate, then will transition to ant-Xa dosing. Continue to monitor H&H and platelets  Durene Dodge A Rosely Fernandez 04/07/2023,5:43 PM

## 2023-04-07 NOTE — ED Provider Notes (Signed)
[facility] Provider Note    None    (approximate)   History   Shortness of Breath   HPI  Timothy Juarez is a 71 y.o. male presenting from [facility] for respiratory distress.  Patient arrives via EMS on CPAP.  Reportedly was hypoxic to the low 80s on supplemental oxygen at the facility.  Does not reportedly wear oxygen but was reportedly eating becoming increasingly short of breath this morning.  EMS gave him DuoNebs x 2 as well as Solu-Medrol for like he is having some improvement.  No report of any fevers and route.  Normotensive.  I feel like he is gotten some better with the CPAP.     Physical Exam   Triage Vital Signs: ED Triage Vitals  Encounter Vitals Group     BP 04/07/23 1509 (!) 100/56     Systolic BP Percentile --      Diastolic BP Percentile --      Pulse Rate 04/07/23 1509 (!) 130     Resp 04/07/23 1509 (!) 35     Temp 04/07/23 1509 97.8 F (36.6 C)     Temp Source 04/07/23 1509 Oral     SpO2 04/07/23 1507 94 %     Weight 04/07/23 1511 214 lb 11.2 oz (97.4 kg)     Height 04/07/23 1511 5\' 6"  (1.676 m)     Head Circumference --      Peak Flow --      Pain Score --      Pain Loc --      Pain Education --      Exclude from Growth Chart --     Most recent vital signs: Vitals:   04/07/23 1516 04/07/23 1530  BP: 105/61 96/65  Pulse: (!) 129 (!) 130  Resp: (!) 35 (!) 30  Temp:    SpO2: 98% 97%     Constitutional: Alert chronically ill appearing male in acute respiratory distress on CPAP Eyes: Conjunctivae are normal.  Head: Atraumatic. Nose: No congestion/rhinnorhea. Mouth/Throat: Mucous membranes are moist.   Neck: Painless ROM.  Cardiovascular: Mildly tachycardic Respiratory: With diffuse rhonchorous breath sounds occasional scattered coarse wheeze. Gastrointestinal: Soft and nontender.  Musculoskeletal:  bilat aka Neurologic:  MAE spontaneously. No gross focal neurologic deficits are appreciated.      ED  Results / Procedures / Treatments   Labs (all labs ordered are listed, but only abnormal results are displayed) Labs Reviewed  CBC WITH DIFFERENTIAL/PLATELET - Abnormal; Notable for the following components:      Result Value   WBC 24.3 (*)    RBC 6.16 (*)    MCV 79.1 (*)    Neutro Abs 21.4 (*)    Monocytes Absolute 1.6 (*)    Basophils Absolute 0.2 (*)    Abs Immature Granulocytes 0.36 (*)    All other components within normal limits  COMPREHENSIVE METABOLIC PANEL - Abnormal; Notable for the following components:   Sodium 132 (*)    Chloride 95 (*)    CO2 21 (*)    Glucose, Bld 238 (*)    BUN 39 (*)    Creatinine, Ser 1.99 (*)    Calcium 8.1 (*)    Albumin 2.8 (*)    AST 46 (*)    Total Bilirubin 3.7 (*)    GFR, Estimated 35 (*)    Anion gap 16 (*)    All other components within normal limits  BRAIN NATRIURETIC PEPTIDE - Abnormal; Notable  for the following components:   B Natriuretic Peptide 262.1 (*)    All other components within normal limits  BLOOD GAS, VENOUS - Abnormal; Notable for the following components:   pCO2, Ven 39 (*)    Acid-base deficit 2.5 (*)    All other components within normal limits  LACTIC ACID, PLASMA - Abnormal; Notable for the following components:   Lactic Acid, Venous 3.8 (*)    All other components within normal limits  TROPONIN I (HIGH SENSITIVITY) - Abnormal; Notable for the following components:   Troponin I (High Sensitivity) 34 (*)    All other components within normal limits  SARS CORONAVIRUS 2 BY RT PCR  CULTURE, BLOOD (ROUTINE X 2)  CULTURE, BLOOD (ROUTINE X 2)  LACTIC ACID, PLASMA     EKG  ED ECG REPORT I, Willy Eddy, the attending physician, personally viewed and interpreted this ECG.   Date: 04/07/2023  EKG Time: 15:18  Rate: 130  Rhythm: sinus  Axis: normal  Intervals: prolonged qt  ST&T Change: no stemi, nonspecific st abn    RADIOLOGY Please see ED Course for my review and interpretation.  I  personally reviewed all radiographic images ordered to evaluate for the above acute complaints and reviewed radiology reports and findings.  These findings were personally discussed with the patient.  Please see medical record for radiology report.    PROCEDURES:  Critical Care performed: Yes, see critical care procedure note(s)  .Critical Care  Performed by: Willy Eddy, MD Authorized by: Willy Eddy, MD   Critical care provider statement:    Critical care time (minutes):  35   Critical care was necessary to treat or prevent imminent or life-threatening deterioration of the following conditions:  Sepsis and respiratory failure   Critical care was time spent personally by me on the following activities:  Ordering and performing treatments and interventions, ordering and review of laboratory studies, ordering and review of radiographic studies, pulse oximetry, re-evaluation of patient's condition, review of old charts, obtaining history from patient or surrogate, examination of patient, evaluation of patient's response to treatment, discussions with primary provider, discussions with consultants and development of treatment plan with patient or surrogate    MEDICATIONS ORDERED IN ED: Medications  ceFEPIme (MAXIPIME) 2 g in sodium chloride 0.9 % 100 mL IVPB (2 g Intravenous New Bag/Given 04/07/23 1546)  azithromycin (ZITHROMAX) 500 mg in sodium chloride 0.9 % 250 mL IVPB (has no administration in time range)  sodium chloride 0.9 % bolus 1,000 mL (1,000 mLs Intravenous New Bag/Given 04/07/23 1550)     IMPRESSION / MDM / ASSESSMENT AND PLAN / ED COURSE  I reviewed the triage vital signs and the nursing notes.                              Differential diagnosis includes, but is not limited to, Asthma, copd, CHF, pna, ptx, malignancy, Pe, anemia  Patient presenting to the ER for evaluation of symptoms as described above.  Based on symptoms, risk factors and considered above  differential, this presenting complaint could reflect a potentially life-threatening illness therefore the patient will be placed on continuous pulse oximetry and telemetry for monitoring.  Laboratory evaluation will be sent to evaluate for the above complaints.  Patient and obvious respiratory distress requiring supplemental oxygen and respiratory support with BiPAP.  May have component of COPD but clinically I suspect more CHF or pneumonia based on exam at this time.  Blood will be sent for the blood differential.  Will order stat chest x-ray.  Doubt PE as he is on anticoagulation.    Clinical Course as of 04/07/23 1605  Tue Apr 07, 2023  1534 This x-ray on my review and interpretation shows evidence of pneumonia.  Will start broad-spectrum antibiotics. [PR]  1545 Will require hospitalization.  Lactate is significantly elevated.  Will give IV fluid resuscitation but will administer judiciously given history of CHF as he is currently normotensive.  Will consult hospitalist for admission. [PR]    Clinical Course User Index [PR] Willy Eddy, MD     FINAL CLINICAL IMPRESSION(S) / ED DIAGNOSES   Final diagnoses:  Sepsis with acute hypoxic respiratory failure without septic shock, due to unspecified organism [facility])     Rx / DC Orders   ED Discharge Orders     None        Note:  This document was prepared using Dragon voice recognition software and may include unintentional dictation errors.    Willy Eddy, MD 04/07/23 [phone]

## 2023-04-07 NOTE — Sepsis Progress Note (Signed)
Sepsis protocol monitored by eLink ?

## 2023-04-07 NOTE — Consult Note (Signed)
CODE SEPSIS - PHARMACY COMMUNICATION  **Broad Spectrum Antibiotics should be administered within 1 hour of Sepsis diagnosis**  Time Code Sepsis Called/Page Received: 1701  Antibiotics Ordered: cefepime, azithromycin   Time of 1st antibiotic administration: 1546  Additional action taken by pharmacy: none  If necessary, Name of Provider/Nurse Contacted: n/a    Bettey Costa ,PharmD Clinical Pharmacist  04/07/2023  5:27 PM

## 2023-04-07 NOTE — Consult Note (Signed)
Pharmacy Antibiotic Note  Timothy Juarez is a 72 y.o. male admitted on 04/07/2023 with  shortness of breath .  Pharmacy has been consulted for Vancomycin and Cefepime dosing. Patient also on Azithromycin  Plan: Vancomycin 2g IV x 1 as loading dose. Given patient is in AKI, will defer further scheduled dosing currently Will order Vanc Random level to be drawn~24 hours after loading dose given on 8/21@1800   Cefepime 2g IV Q12 hours  Height: 5\' 6"  (167.6 cm) Weight: 97.4 kg (214 lb 11.2 oz) IBW/kg (Calculated) : 63.8  Temp (24hrs), Avg:97.8 F (36.6 C), Min:97.8 F (36.6 C), Max:97.8 F (36.6 C)  Recent Labs  Lab 04/07/23 1515 04/07/23 1520  WBC 24.3*  --   CREATININE 1.99*  --   LATICACIDVEN  --  3.8*    Estimated Creatinine Clearance: 36.6 mL/min (A) (by C-G formula based on SCr of 1.99 mg/dL (H)).    No Known Allergies  Antimicrobials this admission: Vanc 8/20 >>  Cefepime 8/20 >>  Azithro 8/20 >>  Dose adjustments this admission: N/A  Microbiology results: 8/20 BCx: collected 8/20 MRSA PCR: pending  Thank you for allowing pharmacy to be a part of this patient's care.  Bettey Costa 04/07/2023 5:29 PM

## 2023-04-07 NOTE — Assessment & Plan Note (Deleted)
Etiology workup in progress, suspect secondary to community-acquired pneumonia/MRSA pneumonia given with strict on chest x-ray Check MRSA PCR Vancomycin per pharmacy, cefepime per pharmacy Continue azithromycin 500 mg IV daily for atypical coverage Continue BiPAP Admit to stepdown, inpatient

## 2023-04-07 NOTE — ED Notes (Signed)
AC contacted again regarding IV team and per Annice Pih, [facility] she was unable to reach them via cellphone. Recommended overhead page by this nurse.

## 2023-04-07 NOTE — Assessment & Plan Note (Signed)
Suspect secondary to severe sepsis, slowly improving -Continue with IV fluid for another day -Monitor renal function -Avoid nephrotoxins

## 2023-04-07 NOTE — Addendum Note (Signed)
Addended by: Renford Dills on: 04/07/2023 08:19 AM   Modules accepted: Level of Service

## 2023-04-07 NOTE — Consult Note (Signed)
Pharmacy Antibiotic Note  Timothy Juarez is a 72 y.o. male admitted on 04/07/2023 with  hypoxia .  Pharmacy has been consulted for Cefepime dosing. Patient also ordered Azithromycin IV 500mg  daily.  Plan: Cefepime 2g IV Q 8 hours  Height: 5\' 6"  (167.6 cm) Weight: 97.4 kg (214 lb 11.2 oz) IBW/kg (Calculated) : 63.8  Temp (24hrs), Avg:97.8 F (36.6 C), Min:97.8 F (36.6 C), Max:97.8 F (36.6 C)  Recent Labs  Lab 04/07/23 1515 04/07/23 1520  WBC 24.3*  --   CREATININE 1.99*  --   LATICACIDVEN  --  3.8*    Estimated Creatinine Clearance: 36.6 mL/min (A) (by C-G formula based on SCr of 1.99 mg/dL (H)).    No Known Allergies  Antimicrobials this admission: Cefepime 8/20 >>  Azithromycin 8/20 >>   Dose adjustments this admission: N/A  Microbiology results: 8/20 BCx: collected 8/20 MRSA PCR: ordered  Thank you for allowing pharmacy to be a part of this patient's care.  Beckey Polkowski A Brandt Chaney 04/07/2023 4:20 PM

## 2023-04-07 NOTE — Hospital Course (Addendum)
Timothy Juarez is a 71 year old male with history of bilateral AKA, morbid obesity, GERD, BPH, neuropathy, hyperlipidemia, depression, anxiety, peripheral atherosclerosis of the extremity arteries, insomnia, who presents to the emergency department from [facility] for chief concerns of shortness of breath.  Of note, patient may have been hypoxic overnight.  Per ED documentation, patient was said to have SpO2 of 87% on room air.  EMS gave patient DuoNebs x 3, 2 g of magnesium, Solu-Medrol 125 mg IV one-time dose and patient on CPAP.  Vitals in the ED showed temperature of 97.8, respiration rate of 30, heart rate of 130, blood pressure 96/65, SpO2 97% on BiPAP.  VBG showed 7.37/CO2 of 39, O2 was pending.  Serum sodium is 132, potassium 3.8, chloride 95, bicarb 21, BUN of 39, serum creatinine 1.99, eGFR 35, nonfasting blood glucose 238, WBC 24.3, hemoglobin 16.2, platelets of 159.  High sensitive troponin was 34.  ED treatment: Sodium chloride 1 L bolus, cefepime, azithromycin.  8/21: Overnight patient had an episode of melena so heparin infusion was stopped.  Monitor signaling for ST elevation, EKG with concern of ST elevation in inferior and lateral leads, cardiology is consulted.  No chest pain and troponin which was barely positive on admission has been normalized.  They discussed cardiac catheterization initially but postponed due to current severe illness. GI was consulted after melena, hemoglobin decreased to 13.7 from 16 on admission.  Likely dilutional or admission hemoglobin might be concentrated as baseline appears to be around 10-12. GI will hold off to any scope at this time. Restarting heparin infusion as hemoglobin remained stable. Concern of MRSA pneumonia, will continue vancomycin and cefepime.  8/22: Afebrile, saturating in low 90s on 3 L of oxygen.  Echocardiogram with normal EF and grade 1 diastolic dysfunction, no regional wall motion abnormalities.  Procalcitonin started  trending at 28.28, potassium 3.2, CO2 20, AKI resolved, improving T. bili at 1.8. Some improvement in leukocytosis at 24 but all cell lines decreased.  Hemoglobin 12.1. Hemoccult card positive.  Stopping heparin infusion as ACS less likely.  8/23: Hemodynamically stable, currently on 2 L of oxygen, no baseline oxygen use.  Sputum cultures with Staph aureus-pending susceptibility.  Blood cultures remain negative.  Stopping cefepime and we will continue with vancomycin for now, likely be discharged tomorrow on doxycycline.  8/24: Afebrile but remained on 2 L of oxygen, worsening leukocytosis after discontinuing cefepime yesterday, starting ceftriaxone for gram-negative coverage, sputum with MRSA. More crackles noted today-giving 1 dose of IV Lasix.  8/25: Remained afebrile, improving procalcitonin and CRP with slight worsening of leukocytosis.  Stopping prednisone.  Hypophosphatemia and hypokalemia which is being repleted.  8/26: Hemodynamically stable and remained afebrile.  Slight worsening leukocytosis but all other infectious parameters has been improving.  Persistent mild hypokalemia, home potassium supplement dose was increased to twice daily.  Patient will need repeat CBC in next few days and if remained elevated then he will need a outpatient hematology evaluation.  Likely secondary to steroid use at this time and they were discontinued.  Patient completed a 7-day course of antibiotics.  He has been given 3 more days of doxycycline on discharge.  Patient never required any pain medications while in the hospital so they were discontinued.  Patient will continue on current medications and need to have a close follow-up with his providers for further recommendations.

## 2023-04-07 NOTE — Assessment & Plan Note (Signed)
Patient is on Eliquis 2.5 mg p.o. twice daily, however patient does not appear to be reliably swallowing medication at this time given patient is on BiPAP We will hold Eliquis 2.5 mg p.o. on admission Heparin per pharmacy ordered

## 2023-04-07 NOTE — Consult Note (Signed)
PHARMACY -  BRIEF ANTIBIOTIC NOTE   Pharmacy has received consult(s) for Cefepime from an ED provider.  The patient's profile has been reviewed for ht/wt/allergies/indication/available labs.    One time order(s) placed for Cefepime 2g IV.  Further antibiotics/pharmacy consults should be ordered by admitting physician if indicated.                       Thank you, Bettey Costa 04/07/2023  3:45 PM

## 2023-04-07 NOTE — Assessment & Plan Note (Signed)
-   This complicates overall care and prognosis.  

## 2023-04-07 NOTE — Assessment & Plan Note (Deleted)
Suspect secondary to pneumonia

## 2023-04-07 NOTE — ED Triage Notes (Signed)
Pt BIB EMS for SOB since this am per staff at [facility]. Pt 87% on room air. Pt admin duoneb x3, 2G mag, 125mg  solumedrol, and c-pap placed by EMS.

## 2023-04-07 NOTE — Assessment & Plan Note (Signed)
Airborne and contact precaution, symptomatic support Patient received Solu-Medrol with EMS at 125 mg IV prior to ED presentation Solu-Medrol 125 mg IV daily, 2 doses ordered for 04/08/2023. -Will switch to prednisone from tomorrow

## 2023-04-07 NOTE — Assessment & Plan Note (Addendum)
Secondary to community-acquired pneumonia, MRSA pneumonia Continue BiPAP-wean as tolerated

## 2023-04-07 NOTE — Assessment & Plan Note (Signed)
Hydralazine 5 mg IV every 8 hours as needed for SBP greater than 180, 2 days ordered

## 2023-04-07 NOTE — Assessment & Plan Note (Signed)
Suspect secondary to community acquired pneumonia

## 2023-04-07 NOTE — ED Notes (Signed)
Called to floor for report and no nurse assigned yet. Awaiting call from ICU for report.

## 2023-04-07 NOTE — ED Notes (Addendum)
AC contacted for IV team to come perform [place] IV; order was placed 1541

## 2023-04-07 NOTE — ED Notes (Signed)
Hospitalist provider at bedside. 

## 2023-04-07 NOTE — Assessment & Plan Note (Addendum)
With multiple organ involvement, renal and pulmonary Etiology workup in progress, suspect secondary to community-acquired pneumonia/MRSA pneumonia given with wedge shaped consolidation on chest x-ray and viral infection (COVID PCR +), MRSA PCR positive Significantly elevated procalcitonin at 47.15.  Preliminary blood culture negative in 24-hour. Patient did received IV fluid and broad-spectrum antibiotics per sepsis protocol. Continue cefepime and vancomycin. Change azithromycin to doxycycline 100 mg IV twice daily for atypical coverage in setting of prolonged QTc.

## 2023-04-08 ENCOUNTER — Inpatient Hospital Stay (HOSPITAL_COMMUNITY)
Admit: 2023-04-08 | Discharge: 2023-04-08 | Disposition: A | Discharge status: Home / Self Care | Payer: Medicare Other | Attending: Internal Medicine | Admitting: Internal Medicine

## 2023-04-08 ENCOUNTER — Inpatient Hospital Stay: Payer: Medicare Other

## 2023-04-08 DIAGNOSIS — K921 Melena: Secondary | ICD-10-CM

## 2023-04-08 DIAGNOSIS — I340 Nonrheumatic mitral (valve) insufficiency: Secondary | ICD-10-CM

## 2023-04-08 DIAGNOSIS — D72829 Elevated white blood cell count, unspecified: Secondary | ICD-10-CM

## 2023-04-08 DIAGNOSIS — I82409 Acute embolism and thrombosis of unspecified deep veins of unspecified lower extremity: Secondary | ICD-10-CM

## 2023-04-08 DIAGNOSIS — U071 COVID-19: Secondary | ICD-10-CM

## 2023-04-08 DIAGNOSIS — R9431 Abnormal electrocardiogram [ECG] [EKG]: Secondary | ICD-10-CM | POA: Diagnosis not present

## 2023-04-08 DIAGNOSIS — A419 Sepsis, unspecified organism: Secondary | ICD-10-CM | POA: Diagnosis not present

## 2023-04-08 DIAGNOSIS — J9601 Acute respiratory failure with hypoxia: Secondary | ICD-10-CM | POA: Diagnosis not present

## 2023-04-08 DIAGNOSIS — R6520 Severe sepsis without septic shock: Secondary | ICD-10-CM | POA: Diagnosis not present

## 2023-04-08 DIAGNOSIS — I5032 Chronic diastolic (congestive) heart failure: Secondary | ICD-10-CM

## 2023-04-08 DIAGNOSIS — Z86718 Personal history of other venous thrombosis and embolism: Secondary | ICD-10-CM

## 2023-04-08 DIAGNOSIS — N179 Acute kidney failure, unspecified: Secondary | ICD-10-CM

## 2023-04-08 LAB — VANCOMYCIN, RANDOM: Vancomycin Rm: 8 ug/mL

## 2023-04-08 LAB — CBC
HCT: 42.2 % (ref 39.0–52.0)
Hemoglobin: 13.7 g/dL (ref 13.0–17.0)
MCH: 26.5 pg (ref 26.0–34.0)
MCHC: 32.5 g/dL (ref 30.0–36.0)
MCV: 81.6 fL (ref 80.0–100.0)
Platelets: 145 10*3/uL — ABNORMAL LOW (ref 150–400)
RBC: 5.17 MIL/uL (ref 4.22–5.81)
RDW: 14.9 % (ref 11.5–15.5)
WBC: 26.7 10*3/uL — ABNORMAL HIGH (ref 4.0–10.5)
nRBC: 0 % (ref 0.0–0.2)

## 2023-04-08 LAB — BASIC METABOLIC PANEL
Anion gap: 13 (ref 5–15)
BUN: 35 mg/dL — ABNORMAL HIGH (ref 8–23)
CO2: 18 mmol/L — ABNORMAL LOW (ref 22–32)
Calcium: 7.4 mg/dL — ABNORMAL LOW (ref 8.9–10.3)
Chloride: 104 mmol/L (ref 98–111)
Creatinine, Ser: 1.5 mg/dL — ABNORMAL HIGH (ref 0.61–1.24)
GFR, Estimated: 49 mL/min — ABNORMAL LOW (ref >60)
Glucose, Bld: 200 mg/dL — ABNORMAL HIGH (ref 70–99)
Potassium: 3.8 mmol/L (ref 3.5–5.1)
Sodium: 135 mmol/L (ref 135–145)

## 2023-04-08 LAB — TROPONIN I (HIGH SENSITIVITY)
Troponin I (High Sensitivity): 15 ng/L (ref <18)
Troponin I (High Sensitivity): 18 ng/L — ABNORMAL HIGH (ref <18)

## 2023-04-08 LAB — BLOOD GAS, VENOUS
Acid-base deficit: 2.5 mmol/L — ABNORMAL HIGH (ref 0.0–2.0)
Bicarbonate: 22.5 mmol/L (ref 20.0–28.0)
O2 Saturation: 53.3 %
Patient temperature: 37
pCO2, Ven: 39 mmHg — ABNORMAL LOW (ref 44–60)
pH, Ven: 7.37 (ref 7.25–7.43)

## 2023-04-08 LAB — EXPECTORATED SPUTUM ASSESSMENT W GRAM STAIN, RFLX TO RESP C

## 2023-04-08 LAB — BRAIN NATRIURETIC PEPTIDE: B Natriuretic Peptide: 138.1 pg/mL — ABNORMAL HIGH (ref 0.0–100.0)

## 2023-04-08 LAB — ECHOCARDIOGRAM COMPLETE
AR max vel: 3.32 cm2
AV Area VTI: 3.05 cm2
AV Area mean vel: 2.55 cm2
AV Mean grad: 2 mmHg
AV Peak grad: 4.2 mmHg
Ao pk vel: 1.03 m/s
Area-P 1/2: 2.91 cm2
Height: 66 in
MV VTI: 3.16 cm2
S' Lateral: 2.8 cm
Weight: 3435.2 oz

## 2023-04-08 LAB — APTT: aPTT: 61 s — ABNORMAL HIGH (ref 24–36)

## 2023-04-08 LAB — HEMOGLOBIN: Hemoglobin: 13.2 g/dL (ref 13.0–17.0)

## 2023-04-08 LAB — MRSA NEXT GEN BY PCR, NASAL: MRSA by PCR Next Gen: DETECTED — AB

## 2023-04-08 LAB — OCCULT BLOOD X 1 CARD TO LAB, STOOL: Fecal Occult Bld: POSITIVE — AB

## 2023-04-08 MED ORDER — DULOXETINE HCL 20 MG PO CPEP
40.0000 mg | ORAL_CAPSULE | Freq: Every day | ORAL | Status: DC
  Administered 2023-04-08 – 2023-04-13 (×6): 40 mg via ORAL
  Filled 2023-04-08 (×6): qty 2

## 2023-04-08 MED ORDER — BRIMONIDINE TARTRATE 0.15 % OP SOLN
1.0000 [drp] | Freq: Three times a day (TID) | OPHTHALMIC | Status: DC
  Administered 2023-04-08 – 2023-04-13 (×15): 1 [drp] via OPHTHALMIC
  Filled 2023-04-08: qty 5

## 2023-04-08 MED ORDER — HEPARIN BOLUS VIA INFUSION
4000.0000 [IU] | Freq: Once | INTRAVENOUS | Status: AC
  Administered 2023-04-08: 4000 [IU] via INTRAVENOUS
  Filled 2023-04-08: qty 4000

## 2023-04-08 MED ORDER — ASPIRIN 81 MG PO TBEC
81.0000 mg | DELAYED_RELEASE_TABLET | Freq: Every day | ORAL | Status: DC
  Administered 2023-04-08 – 2023-04-13 (×6): 81 mg via ORAL
  Filled 2023-04-08 (×6): qty 1

## 2023-04-08 MED ORDER — VANCOMYCIN HCL 2000 MG/400ML IV SOLN
2000.0000 mg | Freq: Once | INTRAVENOUS | Status: AC
  Administered 2023-04-09: 2000 mg via INTRAVENOUS
  Filled 2023-04-08: qty 400

## 2023-04-08 MED ORDER — ATORVASTATIN CALCIUM 10 MG PO TABS
10.0000 mg | ORAL_TABLET | Freq: Every day | ORAL | Status: DC
  Administered 2023-04-08 – 2023-04-13 (×6): 10 mg via ORAL
  Filled 2023-04-08 (×6): qty 1

## 2023-04-08 MED ORDER — KETOROLAC TROMETHAMINE 0.5 % OP SOLN
1.0000 [drp] | Freq: Two times a day (BID) | OPHTHALMIC | Status: DC
  Administered 2023-04-08 – 2023-04-13 (×10): 1 [drp] via OPHTHALMIC
  Filled 2023-04-08: qty 3

## 2023-04-08 MED ORDER — CHLORHEXIDINE GLUCONATE CLOTH 2 % EX PADS
6.0000 | MEDICATED_PAD | Freq: Every day | CUTANEOUS | Status: DC
  Administered 2023-04-08 – 2023-04-09 (×2): 6 via TOPICAL
  Filled 2023-04-08: qty 6

## 2023-04-08 MED ORDER — DOCUSATE SODIUM 100 MG PO CAPS: 100.0000 mg | ORAL_CAPSULE | Freq: Every day | ORAL | Status: DC | PRN

## 2023-04-08 MED ORDER — GABAPENTIN 100 MG PO CAPS
100.0000 mg | ORAL_CAPSULE | Freq: Two times a day (BID) | ORAL | Status: DC
  Administered 2023-04-08: 100 mg via ORAL
  Filled 2023-04-08 (×2): qty 1

## 2023-04-08 MED ORDER — HEPARIN BOLUS VIA INFUSION
1300.0000 [IU] | Freq: Once | INTRAVENOUS | Status: AC
  Administered 2023-04-08: 1300 [IU] via INTRAVENOUS
  Filled 2023-04-08: qty 1300

## 2023-04-08 MED ORDER — HEPARIN (PORCINE) 25000 UT/250ML-% IV SOLN
1700.0000 [IU]/h | INTRAVENOUS | Status: DC
  Administered 2023-04-08 – 2023-04-09 (×2): 1700 [IU]/h via INTRAVENOUS
  Filled 2023-04-08 (×2): qty 250

## 2023-04-08 MED ORDER — PREDNISONE 20 MG PO TABS
50.0000 mg | ORAL_TABLET | Freq: Every day | ORAL | Status: DC
  Administered 2023-04-09 – 2023-04-12 (×4): 50 mg via ORAL
  Filled 2023-04-08 (×3): qty 1

## 2023-04-08 MED ORDER — FERROUS SULFATE 325 (65 FE) MG PO TABS
324.0000 mg | ORAL_TABLET | Freq: Two times a day (BID) | ORAL | Status: DC
  Administered 2023-04-08 – 2023-04-12 (×9): 324 mg via ORAL
  Filled 2023-04-08 (×10): qty 1

## 2023-04-08 MED ORDER — PANTOPRAZOLE SODIUM 40 MG IV SOLR
40.0000 mg | Freq: Two times a day (BID) | INTRAVENOUS | Status: DC
  Administered 2023-04-08 – 2023-04-10 (×5): 40 mg via INTRAVENOUS
  Filled 2023-04-08 (×5): qty 10

## 2023-04-08 MED ORDER — UMECLIDINIUM BROMIDE 62.5 MCG/ACT IN AEPB
1.0000 | INHALATION_SPRAY | Freq: Every day | RESPIRATORY_TRACT | Status: DC
  Administered 2023-04-08 – 2023-04-13 (×6): 1 via RESPIRATORY_TRACT
  Filled 2023-04-08: qty 7

## 2023-04-08 MED ORDER — FLUTICASONE FUROATE-VILANTEROL 100-25 MCG/ACT IN AEPB
1.0000 | INHALATION_SPRAY | Freq: Every day | RESPIRATORY_TRACT | Status: DC
  Administered 2023-04-08 – 2023-04-13 (×6): 1 via RESPIRATORY_TRACT
  Filled 2023-04-08: qty 28

## 2023-04-08 MED ORDER — VANCOMYCIN VARIABLE DOSE PER UNSTABLE RENAL FUNCTION (PHARMACIST DOSING): Status: DC

## 2023-04-08 MED ORDER — TAMSULOSIN HCL 0.4 MG PO CAPS
0.4000 mg | ORAL_CAPSULE | Freq: Every day | ORAL | Status: DC
  Administered 2023-04-08 – 2023-04-13 (×6): 0.4 mg via ORAL
  Filled 2023-04-08 (×7): qty 1

## 2023-04-08 MED ORDER — GABAPENTIN 100 MG PO CAPS
100.0000 mg | ORAL_CAPSULE | Freq: Two times a day (BID) | ORAL | Status: DC
  Administered 2023-04-09 – 2023-04-13 (×9): 100 mg via ORAL
  Filled 2023-04-08 (×9): qty 1

## 2023-04-08 MED ORDER — TIMOLOL MALEATE 0.5 % OP SOLN
1.0000 [drp] | Freq: Every day | OPHTHALMIC | Status: DC
  Administered 2023-04-08 – 2023-04-13 (×6): 1 [drp] via OPHTHALMIC
  Filled 2023-04-08: qty 5

## 2023-04-08 NOTE — Consult Note (Signed)
GI Inpatient Consult Note  Reason for Consult:melena and possible acute blood loss anemia on chronic anticoagulation   Attending Requesting Consult: Dr. Para March  History of Present Illness: Timothy Juarez is a 72 y.o. male seen for evaluation of melena and possible acute blood loss anemia on chronic anticoagulation at the request of Dr. Para March. Patient has a PMH of bilateral AKA, morbid obesity, GERD, BPH, neuropathy, hyperlipidemia, depression, anxiety, peripheral atherosclerosis of the extremity arteries, insomnia, who presented to the emergency department from [facility] for chief concerns of shortness of breath. He arrived in respiratory  distress on CPAP, hypoxia with low sats 80s on supplemental O2 at the facility. Vital signs were temperature of 97.8, respiration rate of 30, heart rate of 130, blood pressure 96/65, SpO2 97% on BiPAP. . Labs were significant for Serum sodium is 132, potassium 3.8, chloride 95, bicarb 21, BUN of 39, serum creatinine 1.99, eGFR 35, nonfasting blood glucose 238, WBC 24.3, hemoglobin 16.2, platelets of 159. High sensitive troponin was 34. Lactic acid 3.8. Covid positive.Imaging studies revealed CXR: RUL pneumonia.    Patient takes Apixaban at home for history of DVT. He was noted to having trouble swallowing with the BIPAP and heparin infusion was started since he was unable to swallow his Eliquis. Shortly after arrival, nursing staff reported a melanotic stool and the stool was sent to lab for hemoccult.The Hgb decreased form 16-13. The heparin drip was stopped for concern about GI bleed. He was started on IV Protonix. His CXR this am has shown worsening progression. He has EKG changes overnight and Cardiology has recommend an echocardiogram for further evaluation.   The patient is examined in the ICU on BIPAP and his only complaint is shortness of breath.  He denies any heartburn, reflux, nausea, vomiting, or abdominal pain.  He denies seeing any melena or  bright red blood at home. Unable to give ROS 2/2 feeling SOB with BIPAP. No family at bedside.   Chart review shows history of severe esophagitis in 2021 causing a GI bleed.  He had repeat EGD that showed healing no other findings on colonoscopy.  History of video capsule endoscopy   Past Medical History:  Past Medical History:  Diagnosis Date   Acute embolism and thombos unsp deep vn unsp lower extremity (HCC)    Acute respiratory failure (HCC)    AKI (acute kidney injury) (HCC) 12/27/2018   Allergy    Anemia    Aneurysm of unspecified site (HCC)    ARF (acute respiratory failure) (HCC)    H/O   Bronchitis    Cellulitis and abscess of leg 09/27/2019   CHF (congestive heart failure) (HCC)    COPD (chronic obstructive pulmonary disease) (HCC)    Cough    Epistaxis    Gastrointestinal hemorrhage    GERD (gastroesophageal reflux disease)    Gout    Hematemesis 04/27/2020   High anion gap metabolic acidosis 12/27/2018   Hyperlipidemia    Hypertension    Hypokalemia    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) 09/27/2019   Insomnia    Iron deficiency anemia 04/21/2017   Muscle contracture    MUSCLE SPASMS, muscle weakness   Peripheral vascular disease (HCC)    Pneumonia    Pressure ulcer    Severe sepsis (HCC) 12/27/2018   Toxic encephalopathy 12/27/2018    Problem List: Patient Active Problem List   Diagnosis Date Noted   Severe sepsis with acute organ dysfunction (HCC) 04/07/2023  Morbid obesity (HCC) 04/07/2023   S/P AKA (above knee amputation) bilateral (HCC) 04/06/2023   Carotid bruit 04/06/2023   Atherosclerosis of artery of extremity with rest pain (HCC) 07/18/2021   Ischemic leg 03/21/2021   Esophagitis    Esophageal ulcer with bleeding 04/28/2020   Chronic diastolic CHF (congestive heart failure) (HCC) 04/27/2020   Leukocytosis 04/27/2020   Alcohol use 04/27/2020   Pulmonary disease    Hypotension 09/29/2019   History of COVID-19 09/27/2019    Abscess of left AKA stump 09/27/2019   (HFpEF) heart failure with preserved ejection fraction (HCC) 09/27/2019   COPD with chronic bronchitis 09/27/2019   Chronic anticoagulation with eliquis 09/27/2019   Acute on chronic respiratory failure with hypoxia (HCC) 12/27/2018   Community acquired bilateral lower lobe pneumonia 12/27/2018   COVID-19 virus infection 12/27/2018   AKI (acute kidney injury) (HCC) 12/27/2018   Acute respiratory failure with hypoxia (HCC) 12/27/2018   Status post above-knee amputation (HCC) 11/02/2018   Symptomatic anemia 10/10/2017   Chronic deep vein thrombosis (DVT) of left lower extremity (HCC)    Iron deficiency anemia 04/21/2017   Essential hypertension 03/13/2017   Recurrent deep vein thrombosis (DVT) (HCC) 03/11/2017   Hyperlipidemia 11/04/2016   Pressure ulcer 04/11/2016   Pseudoaneurysm of left femoral artery (HCC) 04/11/2016   Atherosclerosis of native arteries of extremity with rest pain (HCC) 04/10/2016   Atherosclerosis of native arteries of extremities with gangrene, left leg (HCC) 11/01/2015   Ischemia of lower extremity 09/14/2015   Atherosclerotic peripheral vascular disease with ulceration (HCC) 01/18/2015    Past Surgical History: Past Surgical History:  Procedure Laterality Date   AMPUTATION Left 09/19/2015   Procedure: AMPUTATION BELOW KNEE;  Surgeon: Annice Needy, MD;  Location: [facility];  Service: Vascular;  Laterality: Left;   AMPUTATION Left 11/01/2015   Procedure: AMPUTATION ABOVE KNEE;  Surgeon: Annice Needy, MD;  Location: [facility];  Service: Vascular;  Laterality: Left;   AMPUTATION Left 09/29/2019   Procedure: IRRIGATION AND DEBRIDEMENT OF LEFT AKA;  Surgeon: Annice Needy, MD;  Location: [facility];  Service: General;  Laterality: Left;   AMPUTATION Right 07/18/2021   Procedure: AMPUTATION ABOVE KNEE;  Surgeon: Annice Needy, MD;  Location: [facility];  Service: Vascular;  Laterality: Right;   APPLICATION OF WOUND VAC Left 10/18/2015    Procedure: APPLICATION OF WOUND VAC;  Surgeon: Annice Needy, MD;  Location: [facility];  Service: Vascular;  Laterality: Left;   APPLICATION OF WOUND VAC Left 09/30/2019   Procedure: APPLICATION OF WOUND VAC;  Surgeon: Renford Dills, MD;  Location: [facility];  Service: Vascular;  Laterality: Left;  serial # [id number]   CARDIAC CATHETERIZATION     CATARACT EXTRACTION W/PHACO Right 08/18/2019   Procedure: CATARACT EXTRACTION PHACO AND INTRAOCULAR LENS PLACEMENT (IOC) RIGHT;  Surgeon: Galen Manila, MD;  Location: [facility];  Service: Ophthalmology;  Laterality: Right;  [place] 03:29.0 CDE 45.68 Fluid Pack Lot # [id number]   CENTRAL VENOUS CATHETER INSERTION Right 09/30/2019   Procedure: INSERTION CENTRAL LINE ADULT;  Surgeon: Renford Dills, MD;  Location: [facility];  Service: Vascular;  Laterality: Right;   COLONOSCOPY WITH PROPOFOL N/A 05/25/2017   Procedure: COLONOSCOPY WITH PROPOFOL;  Surgeon: Wyline Mood, MD;  Location:  [facility]COPY;  Service: Gastroenterology;  Laterality: N/A;   COLONOSCOPY WITH PROPOFOL N/A 10/14/2017   Procedure: COLONOSCOPY WITH PROPOFOL;  Surgeon: Wyline Mood, MD;  Location: [facility] ENDOSCOPY;  Service: Gastroenterology;  Laterality: N/A;   ESOPHAGOGASTRODUODENOSCOPY (EGD) WITH PROPOFOL  N/A 05/25/2017   Procedure: ESOPHAGOGASTRODUODENOSCOPY (EGD) WITH PROPOFOL;  Surgeon: Wyline Mood, MD;  Location: [facility] ENDOSCOPY;  Service: Gastroenterology;  Laterality: N/A;   ESOPHAGOGASTRODUODENOSCOPY (EGD) WITH PROPOFOL N/A 10/14/2017   Procedure: ESOPHAGOGASTRODUODENOSCOPY (EGD) WITH PROPOFOL;  Surgeon: Wyline Mood, MD;  Location: [facility] ENDOSCOPY;  Service: Gastroenterology;  Laterality: N/A;   ESOPHAGOGASTRODUODENOSCOPY (EGD) WITH PROPOFOL N/A 04/27/2020   Procedure: ESOPHAGOGASTRODUODENOSCOPY (EGD) WITH PROPOFOL;  Surgeon: Toney Reil, MD;  Location: [facility] ENDOSCOPY;  Service: Gastroenterology;  Laterality: N/A;   ESOPHAGOGASTRODUODENOSCOPY (EGD) WITH PROPOFOL N/A 06/18/2020    Procedure: ESOPHAGOGASTRODUODENOSCOPY (EGD) WITH PROPOFOL;  Surgeon: Toney Reil, MD;  Location: [facility] ENDOSCOPY;  Service: Gastroenterology;  Laterality: N/A;   EYE SURGERY     GIVENS CAPSULE STUDY N/A 07/14/2017   Procedure: GIVENS CAPSULE STUDY;  Surgeon: Wyline Mood, MD;  Location: [facility] ENDOSCOPY;  Service: Gastroenterology;  Laterality: N/A;   GIVENS CAPSULE STUDY N/A 10/30/2017   Procedure: GIVENS CAPSULE STUDY 12 HR;  Surgeon: Wyline Mood, MD;  Location: [facility] ENDOSCOPY;  Service: Gastroenterology;  Laterality: N/A;   IVC FILTER INSERTION N/A 10/12/2017   Procedure: IVC FILTER INSERTION;  Surgeon: Annice Needy, MD;  Location: [facility] INV[facility] CV LAB;  Service: Cardiovascular;  Laterality: N/A;   LOWER EXTREMITY ANGIOGRAPHY Right 10/04/2018   Procedure: LOWER EXTREMITY ANGIOGRAPHY;  Surgeon: Annice Needy, MD;  Location: [facility];  Service: Cardiovascular;  Laterality: Right;   LOWER EXTREMITY ANGIOGRAPHY Right 03/21/2021   Procedure: LOWER EXTREMITY ANGIOGRAPHY;  Surgeon: Annice Needy, MD;  Location: [facility];  Service: Cardiovascular;  Laterality: Right;   LOWER EXTREMITY ANGIOGRAPHY Right 05/23/2021   Procedure: LOWER EXTREMITY ANGIOGRAPHY;  Surgeon: Annice Needy, MD;  Location: [facility];  Service: Cardiovascular;  Laterality: Right;   PERIPHERAL VASCULAR CATHETERIZATION Left 01/18/2015   Procedure: Lower Extremity Angiography;  Surgeon: Annice Needy, MD;  Location: [facility];  Service: Cardiovascular;  Laterality: Left;   PERIPHERAL VASCULAR CATHETERIZATION N/A 01/18/2015   Procedure: Lower Extremity Intervention;  Surgeon: Annice Needy, MD;  Location: [facility];  Service: Cardiovascular;  Laterality: N/A;   PERIPHERAL VASCULAR CATHETERIZATION  07/30/2015   Procedure: Lower Extremity Intervention;  Surgeon: Annice Needy, MD;  Location: [facility];  Service: Cardiovascular;;   PERIPHERAL VASCULAR CATHETERIZATION N/A 07/30/2015    Procedure: Abdominal Aortogram w/Lower Extremity;  Surgeon: Annice Needy, MD;  Location: [facility];  Service: Cardiovascular;  Laterality: N/A;   PERIPHERAL VASCULAR CATHETERIZATION Left 08/22/2015   Procedure: Lower Extremity Angiography;  Surgeon: Annice Needy, MD;  Location: [facility];  Service: Cardiovascular;  Laterality: Left;   PERIPHERAL VASCULAR CATHETERIZATION Left 08/22/2015   Procedure: Lower Extremity Intervention;  Surgeon: Annice Needy, MD;  Location: [facility];  Service: Cardiovascular;  Laterality: Left;   PERIPHERAL VASCULAR CATHETERIZATION Right 03/31/2016   Procedure: Lower Extremity Angiography;  Surgeon: Annice Needy, MD;  Location: [facility];  Service: Cardiovascular;  Laterality: Right;   PERIPHERAL VASCULAR CATHETERIZATION  03/31/2016   Procedure: Lower Extremity Intervention;  Surgeon: Annice Needy, MD;  Location: [facility];  Service: Cardiovascular;;   PERIPHERAL VASCULAR CATHETERIZATION Left 04/10/2016   Procedure: Lower Extremity Angiography;  Surgeon: Annice Needy, MD;  Location: [facility];  Service: Cardiovascular;  Laterality: Left;   VACUUM ASSISTED CLOSURE CHANGE Left 10/03/2019   Procedure: LEFT THIGH VACUUM ASSISTED CLOSURE CHANGE;  Surgeon: Wyn Quaker,  Marlow Baars, MD;  Location: [facility];  Service: General;  Laterality: Left;   WOUND DEBRIDEMENT Left 10/18/2015   Procedure: DEBRIDEMENT WOUND   ( LEFT BKA DEBRIDEMENT );  Surgeon: Annice Needy, MD;  Location: [facility];  Service: Vascular;  Laterality: Left;    Allergies: No Known Allergies  Home Medications: Medications Prior to Admission  Medication Sig Dispense Refill Last Dose   acetaminophen (TYLENOL) 325 MG tablet Take 650 mg by mouth every 4 (four) hours as needed for fever.    prn at prn   ALPHAGAN P 0.1 % SOLN Place 1 drop into the right eye 3 (three) times daily.   04/07/2023 at 1400   apixaban (ELIQUIS) 2.5 MG TABS tablet Take 1 tablet (2.5 mg total) by mouth 2  (two) times daily. 60 tablet 3 04/07/2023 at 1000   ascorbic acid (VITAMIN C) 500 MG tablet Take 500 mg by mouth 2 (two) times daily.   04/07/2023 at 1000   aspirin EC 81 MG EC tablet Take 1 tablet (81 mg total) by mouth daily. Swallow whole. 90 tablet 3 04/07/2023 at 1000   atorvastatin (LIPITOR) 10 MG tablet Take 1 tablet (10 mg total) by mouth daily. (Patient taking differently: Take 5 mg by mouth daily.) 30 tablet 11 04/06/2023 at 2200   baclofen (LIORESAL) 10 MG tablet Take 10 mg by mouth 3 (three) times daily. Hold for sedation   04/07/2023 at 1400   Cholecalciferol (VITAMIN D3) 50 MCG (2000 UT) TABS Take 4,000 Int'l Units by mouth daily.   04/07/2023 at 1000   docusate sodium (COLACE) 100 MG capsule Take 100 mg by mouth daily as needed for mild constipation.   prn at prn   DULoxetine (CYMBALTA) 20 MG capsule Take 40 mg by mouth daily.   04/07/2023 at 1000   fentaNYL (DURAGESIC) 25 MCG/HR Place 1 patch onto the skin every 3 (three) days.    04/05/2023 at 1000   ferrous sulfate 324 (65 Fe) MG TBEC Take 324 mg by mouth 2 (two) times daily.    04/07/2023 at 1000   Fluticasone-Umeclidin-Vilant 100-62.5-25 MCG/ACT AEPB Inhale 1 puff into the lungs every morning.   04/07/2023 at 1000   furosemide (LASIX) 40 MG tablet Take 40 mg by mouth daily.   04/07/2023 at 1000   gabapentin (NEURONTIN) 100 MG capsule Take 100 mg by mouth every 12 (twelve) hours.   04/07/2023 at 1000   GOODSENSE ARTIFICIAL TEARS 0.5-0.6 % SOLN Place into the right eye 4 (four) times daily.   04/07/2023 at 1400   guaiFENesin (ROBITUSSIN) 100 MG/5ML SOLN Take 15 mLs by mouth 3 (three) times daily as needed for cough (congestion).   prn at prn   ketorolac (ACULAR) 0.4 % SOLN Place 1 drop into the right eye 2 (two) times daily.   04/07/2023 at 1000   loperamide (IMODIUM) 2 MG capsule Take 2 mg by mouth 3 (three) times daily as needed for diarrhea or loose stools.   prn at prn   nitroGLYCERIN (NITROSTAT) 0.4 MG SL tablet Place 0.14 mg under the  tongue every 5 (five) minutes as needed for chest pain.   prn at prn   Omega-3 Fatty Acids (FISH OIL) 1000 MG CAPS Take 1,000 mg by mouth daily.    04/07/2023 at 1000   Oxycodone HCl 10 MG TABS Take 10 mg by mouth every 4 (four) hours as needed (severe pain).   prn at prn   oxymetazoline (AFRIN) 0.05 % nasal spray Place 2  sprays into both nostrils 2 (two) times daily as needed (For nose bleeds).   prn at prn   potassium chloride SA (K-DUR,KLOR-CON) 20 MEQ tablet Take 20 mEq by mouth daily.   04/07/2023 at 1000   sodium chloride (OCEAN) 0.65 % SOLN nasal spray Place 1 spray into both nostrils 2 (two) times daily as needed for congestion.   prn at prn   spiritus frumenti (ETHYL ALCOHOL) SOLN 3 oz of bourbon by mouth at bed time as needed for sleep, pleasure.   prn at prn   tamsulosin (FLOMAX) 0.4 MG CAPS capsule Take 1 capsule (0.4 mg total) by mouth daily. 30 capsule 0 04/06/2023 at 2200   timolol (TIMOPTIC) 0.5 % ophthalmic solution Place 1 drop into the right eye daily.   04/07/2023 at 1000   acidophilus (RISAQUAD) CAPS capsule Take 1 capsule by mouth 2 (two) times daily. (Patient not taking: Reported on 04/08/2023)   Not Taking   brimonidine-timolol (COMBIGAN) 0.2-0.5 % ophthalmic solution Place 1 drop into the right eye 2 (two) times daily. (Patient not taking: Reported on 04/08/2023)   Not Taking   pantoprazole (PROTONIX) 40 MG tablet Take 1 tablet (40 mg total) by mouth 2 (two) times daily. (Patient not taking: Reported on 04/08/2023) 60 tablet 0 Not Taking   Home medication reconciliation was completed with the patient.   Scheduled Inpatient Medications:    Chlorhexidine Gluconate Cloth  6 each Topical Daily   methylPREDNISolone (SOLU-MEDROL) injection  125 mg Intravenous Daily   pantoprazole (PROTONIX) IV  40 mg Intravenous Q12H    Continuous Inpatient Infusions:    ceFEPime (MAXIPIME) IV Stopped (04/08/23 0434)   doxycycline (VIBRAMYCIN) IV     lactated ringers 150 mL/hr (04/08/23 0700)     PRN Inpatient Medications:  acetaminophen **OR** acetaminophen, hydrALAZINE, metoprolol tartrate, ondansetron **OR** ondansetron (ZOFRAN) IV, polyethylene glycol, senna-docusate  Family History: family history includes Heart attack in his mother.  The patient's family history is negative for inflammatory bowel disorders, GI malignancy, or solid organ transplantation.  Social History:   reports that he quit smoking about 10 years ago. His smoking use included cigarettes. He started smoking about 54 years ago. He has a 44 pack-year smoking history. He has never used smokeless tobacco. He reports that he does not currently use alcohol. He reports that he does not use drugs. The patient denies ETOH, tobacco, or drug use.   Review of Systems: Constitutional:  Unable to give ROS 2/2 SOB. He denies GI complaints.    Physical Examination: BP 120/67   Pulse 96   Temp (!) 97.3 F (36.3 C) (Axillary)   Resp 20   Ht 5\' 6"  (1.676 m)   Wt 97.4 kg   SpO2 96%   BMI 34.65 kg/m  Gen: BIPAP, increase in respiratory effort Chest: CTA right sided wheezes, crackles, or other adventitious sounds CV: RRR, no m/g/c/r Abd: soft, protuberant, NT, ND, +BS in all four quadrants; no HSM, guarding, rigidity, or rebound tenderness Ext: Bilat lower ext amputations  Skin: no rash or lesions noted Lymph: no LAD  Data: Lab Results  Component Value Date   WBC 26.7 (H) 04/08/2023   HGB 13.7 04/08/2023   HCT 42.2 04/08/2023   MCV 81.6 04/08/2023   PLT 145 (L) 04/08/2023   Recent Labs  Lab 04/07/23 1515 04/08/23 0231  HGB 16.2 13.7   Lab Results  Component Value Date   NA 135 04/08/2023   K 3.8 04/08/2023   CL 104 04/08/2023  CO2 18 (L) 04/08/2023   BUN 35 (H) 04/08/2023   CREATININE 1.50 (H) 04/08/2023   Lab Results  Component Value Date   ALT 25 04/07/2023   AST 46 (H) 04/07/2023   ALKPHOS 66 04/07/2023   BILITOT 3.7 (H) 04/07/2023   Recent Labs  Lab 04/07/23 1819 04/08/23 0227   APTT 40* 61*  INR 2.1*  --    Assessment/Plan: Mr. Blaize is a 72 y.o. male who was admitted yesterday for severe sepsis with acute organ dysfunction, COVID, acute respiratory failure with hypoxia on BIPAP, AKI, EKG changes, recurrent DVT on anticoagulation with Eliquis. He was unable to swallow the Eliquis with BIPAP and Heparin drip was briefly started and stopped when a medium dark, tarry stool was noted and drift in Hgb form 16- 13 this am. He has a hx of anemia with esophageal ulcer with bleeding in 2021.     Recommendations: He is a complex patient undergoing critical care treatment at this time. His Hgb drift could be dilutional.  In the absence of acute active life-threatening GI bleed would not recommend luminal evaluation at this time.   NPO Imaging reviewed  Protonix 40 mg IV every 12 hours Hold DVT PPx Hold heparin drip H&H Transfusion resuscitation as per primary team Avoid frequent lab draws to prevent lab induced anemia Supportive care antiemetics per primary team Maintain 2 sites IV access Avoid NSAIDs next monitor for GI bleed next  Thank you for the consult. Please call with questions or concerns.  Amedeo Kinsman, ANP [facility] [phone]

## 2023-04-08 NOTE — Progress Notes (Signed)
*  PRELIMINARY RESULTS* Echocardiogram 2D Echocardiogram has been performed.  Carolyne Fiscal 04/08/2023, 9:16 AM

## 2023-04-08 NOTE — Progress Notes (Signed)
Progress Note   Patient: Timothy Juarez YHC:[id number] DOB: 1950-09-06 DOA: 04/07/2023     1 DOS: the patient was seen and examined on 04/08/2023   Brief hospital course: Mr. Kiryn Lersch is a 72 year old male with history of bilateral AKA, morbid obesity, GERD, BPH, neuropathy, hyperlipidemia, depression, anxiety, peripheral atherosclerosis of the extremity arteries, insomnia, who presents to the emergency department from [facility] for chief concerns of shortness of breath.  Of note, patient may have been hypoxic overnight.  Per ED documentation, patient was said to have SpO2 of 87% on room air.  EMS gave patient DuoNebs x 3, 2 g of magnesium, Solu-Medrol 125 mg IV one-time dose and patient on CPAP.  Vitals in the ED showed temperature of 97.8, respiration rate of 30, heart rate of 130, blood pressure 96/65, SpO2 97% on BiPAP.  VBG showed 7.37/CO2 of 39, O2 was pending.  Serum sodium is 132, potassium 3.8, chloride 95, bicarb 21, BUN of 39, serum creatinine 1.99, eGFR 35, nonfasting blood glucose 238, WBC 24.3, hemoglobin 16.2, platelets of 159.  High sensitive troponin was 34.  ED treatment: Sodium chloride 1 L bolus, cefepime, azithromycin.  8/21: Overnight patient had an episode of melena so heparin infusion was stopped.  Monitor signaling for ST elevation, EKG with concern of ST elevation in inferior and lateral leads, cardiology is consulted.  No chest pain and troponin which was barely positive on admission has been normalized.  They discussed cardiac catheterization initially but postponed due to current severe illness. GI was consulted after melena, hemoglobin decreased to 13.7 from 16 on admission.  Likely dilutional or admission hemoglobin might be concentrated as baseline appears to be around 10-12. GI will hold off to any scope at this time. Restarting heparin infusion as hemoglobin remained stable. Concern of MRSA pneumonia, will continue vancomycin and  cefepime.  Assessment and Plan: * Severe sepsis with acute organ dysfunction (HCC) With multiple organ involvement, renal and pulmonary Etiology workup in progress, suspect secondary to community-acquired pneumonia/MRSA pneumonia given with wedge shaped consolidation on chest x-ray and viral infection (COVID PCR +), MRSA PCR positive Significantly elevated procalcitonin at 47.15.  Preliminary blood culture negative in 24-hour. Patient did received IV fluid and broad-spectrum antibiotics per sepsis protocol. Continue cefepime and vancomycin. Change azithromycin to doxycycline 100 mg IV twice daily for atypical coverage in setting of prolonged QTc.  Acute respiratory failure with hypoxia (HCC) Secondary to community-acquired pneumonia, MRSA pneumonia Continue BiPAP-wean as tolerated  COVID-19 virus infection Airborne and contact precaution, symptomatic support Patient received Solu-Medrol with EMS at 125 mg IV prior to ED presentation Solu-Medrol 125 mg IV daily, 2 doses ordered for 04/08/2023. -Will switch to prednisone from tomorrow  Leukocytosis Suspect secondary to community acquired pneumonia  Recurrent deep vein thrombosis (DVT) (HCC) Patient is on Eliquis 2.5 mg p.o. twice daily, however patient does not appear to be reliably swallowing medication at this time given patient is on BiPAP We will hold Eliquis 2.5 mg p.o. on admission Heparin per pharmacy ordered  Essential hypertension Blood pressure within goal, he was only on Lasix at home which is currently being held. Hydralazine 5 mg IV every 8 hours as needed for SBP greater than 180, 2 days ordered  AKI (acute kidney injury) (HCC) Suspect secondary to severe sepsis, slowly improving -Continue with IV fluid for another day -Monitor renal function -Avoid nephrotoxins  (HFpEF) heart failure with preserved ejection fraction (HCC) BNP mildly elevated at 138 Strict I's and O's Monitor volume status  closely as patient  received IV fluid  Morbid obesity (HCC) Estimated body mass index is 34.65 kg/m as calculated from the following:   Height as of this encounter: 5\' 6"  (1.676 m).   Weight as of this encounter: 97.4 kg.   This complicates overall care and prognosis.   Hyperlipidemia -Continue home statin      Subjective: Patient was seen and examined today.  Denies any chest pain or shortness of breath.  Remained on BiPAP but able to speak sentences now.  Physical Exam: Vitals:   04/08/23 0900 04/08/23 1000 04/08/23 1040 04/08/23 1100  BP: (!) 111/53 (!) 103/54  (!) 115/59  Pulse: 82 80  86  Resp: 20 (!) 22  17  Temp:      TempSrc:      SpO2: 94% 94% 93% (!) 87%  Weight:      Height:       General.  Chronically ill-appearing obese elderly man, in no acute distress. Pulmonary.  Few scattered rhonchi bilaterally, normal respiratory effort. CV.  Regular rate and rhythm, no JVD, rub or murmur. Abdomen.  Soft, nontender, nondistended, BS positive. CNS.  Alert and oriented .  No focal neurologic deficit. Extremities.  Bilateral AKA   Data Reviewed: Prior data reviewed  Family Communication: Talked with daughter on phone.  Disposition: Status is: Inpatient Remains inpatient appropriate because: Severity of illness.  Planned Discharge Destination: Skilled nursing facility  DVT Prophylaxis: Heparin Time spent: 50 minutes  This record has been created using Conservation officer, historic buildings. Errors have been sought and corrected,but may not always be located. Such creation errors do not reflect on the standard of care.   Author: Arnetha Courser, MD 04/08/2023 1:14 PM  For on call review [email or web address].

## 2023-04-08 NOTE — Progress Notes (Signed)
Pt transported to ICU 10 on BIPAP without incident. Pt remains on BIPAP and is tol well at this time. Report given to ICU RT.

## 2023-04-08 NOTE — Consult Note (Signed)
Pharmacy Antibiotic Note  Timothy Juarez is a 72 y.o. male admitted on 04/07/2023 with  shortness of breath .  Pharmacy has been consulted for Vancomycin and Cefepime dosing. Patient also on Azithromycin  Plan: Vancomycin 2g IV x 1 as loading dose. Given patient is in AKI, will defer further scheduled dosing currently Will order Vanc Random level to be drawn~24 hours after loading dose given on 8/21@1800   Cefepime 2g IV Q12 hours  8/21 1816 Vanc Rdm 8 8/21 SCr 1.5, Improving Ordered Vancomycin 2000 mg x 1 Will reassess renal stability in AM and will order scheduled Vancomycin when appropriate     Height: 5\' 6"  (167.6 cm) Weight: 97.4 kg (214 lb 11.2 oz) IBW/kg (Calculated) : 63.8  Temp (24hrs), Avg:97.5 F (36.4 C), Min:97 F (36.1 C), Max:97.8 F (36.6 C)  Recent Labs  Lab 04/07/23 1515 04/07/23 1520 04/07/23 1819 04/07/23 2113 04/08/23 0231 04/08/23 1816  WBC 24.3*  --   --   --  26.7*  --   CREATININE 1.99*  --   --   --  1.50*  --   LATICACIDVEN  --  3.8* 3.8* 3.1*  --   --   VANCORANDOM  --   --   --   --   --  8    Estimated Creatinine Clearance: 48.6 mL/min (A) (by C-G formula based on SCr of 1.5 mg/dL (H)).    No Known Allergies  Antimicrobials this admission: Vanc 8/20 >>  Cefepime 8/20 >>  Azithro 8/20 >>  Dose adjustments this admission: N/A  Microbiology results: 8/20 BCx: collected 8/20 MRSA PCR: pending  Thank you for allowing pharmacy to be a part of this patient's care.  Otelia Sergeant, PharmD, [facility] 04/08/2023 11:37 PM

## 2023-04-08 NOTE — Consult Note (Signed)
ANTICOAGULATION CONSULT NOTE  Pharmacy Consult for Heparin Infusion Indication:  history of DVT  No Known Allergies  Patient Measurements: Height: 5\' 6"  (167.6 cm) Weight: 97.4 kg (214 lb 11.2 oz) IBW/kg (Calculated) : 63.8 Heparin Dosing Weight: 85 kg  Vital Signs: Temp: 97.3 F (36.3 C) (08/21 0550) Temp Source: Axillary (08/21 0550) BP: 115/59 (08/21 1100) Pulse Rate: 86 (08/21 1100)  Labs: Recent Labs    04/07/23 1515 04/07/23 1819 04/08/23 0227 04/08/23 0231 04/08/23 0741 04/08/23 0918 04/08/23 1108  HGB 16.2  --   --  13.7  --   --  13.2  HCT 48.7  --   --  42.2  --   --   --   PLT 159  --   --  145*  --   --   --   APTT  --  40* 61*  --   --   --   --   LABPROT  --  23.7*  --   --   --   --   --   INR  --  2.1*  --   --   --   --   --   HEPARINUNFRC  --  >1.10*  --   --   --   --   --   CREATININE 1.99*  --   --  1.50*  --   --   --   TROPONINIHS 34* 32*  --   --  15 18*  --     Estimated Creatinine Clearance: 48.6 mL/min (A) (by C-G formula based on SCr of 1.5 mg/dL (H)).   Medical History: Past Medical History:  Diagnosis Date   Acute embolism and thombos unsp deep vn unsp lower extremity (HCC)    Acute respiratory failure (HCC)    AKI (acute kidney injury) (HCC) 12/27/2018   Allergy    Anemia    Aneurysm of unspecified site (HCC)    ARF (acute respiratory failure) (HCC)    H/O   Bronchitis    Cellulitis and abscess of leg 09/27/2019   CHF (congestive heart failure) (HCC)    COPD (chronic obstructive pulmonary disease) (HCC)    Cough    Epistaxis    Gastrointestinal hemorrhage    GERD (gastroesophageal reflux disease)    Gout    Hematemesis 04/27/2020   High anion gap metabolic acidosis 12/27/2018   Hyperlipidemia    Hypertension    Hypokalemia    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) 09/27/2019   Insomnia    Iron deficiency anemia 04/21/2017   Muscle contracture    MUSCLE SPASMS, muscle weakness   Peripheral vascular disease  (HCC)    Pneumonia    Pressure ulcer    Severe sepsis (HCC) 12/27/2018   Toxic encephalopathy 12/27/2018    Pertinent Medications:  Apixaban 2.5mg  BID, last dose: patient believes he took it this morning(~0800 on 8/20), Called [facility] but no one was available to assist.  Assessment: 72 year old male with history of bilateral AKA, morbid obesity, GERD, BPH, neuropathy, hyperlipidemia, depression, anxiety, peripheral atherosclerosis of the extremity arteries, insomnia, who presents to the emergency department from [facility] for chief concerns of shortness of breath. Patient takes Apixaban at home for history of DVT. Currently, he is having trouble swallowing. Pharmacy has been consulted to initiate heparin infusion. Overnight patient had an episode of melena so heparin infusion was stopped and is now being resumed  Goal of Therapy:  Heparin level 0.3-0.7 units/ml Heparin  level 66-102 units/ml Monitor platelets by anticoagulation protocol: Yes   Plan:  ---bolus 4000 units IV heparin x 1 ---Start heparin infusion at 1700 units/hr Check aPTT level in 8 hours and anti-Xa level daily while on heparin ---Will continue to dose via aPTT until both levels correlate, then will transition to ant-Xa dosing. ---Continue to monitor H&H and platelets  Lowella Bandy 04/08/2023,1:42 PM

## 2023-04-08 NOTE — Consult Note (Signed)
ANTICOAGULATION CONSULT NOTE  Pharmacy Consult for Heparin Infusion Indication:  history of DVT  No Known Allergies  Patient Measurements: Height: 5\' 6"  (167.6 cm) Weight: 97.4 kg (214 lb 11.2 oz) IBW/kg (Calculated) : 63.8 Heparin Dosing Weight: 85 kg  Vital Signs: Temp: 97.8 F (36.6 C) (08/21 0200) Temp Source: Oral (08/21 0200) BP: 137/67 (08/21 0200) Pulse Rate: 110 (08/21 0200)  Labs: Recent Labs    04/07/23 1515 04/07/23 1819 04/08/23 0227 04/08/23 0231  HGB 16.2  --   --  13.7  HCT 48.7  --   --  42.2  PLT 159  --   --  145*  APTT  --  40* 61*  --   LABPROT  --  23.7*  --   --   INR  --  2.1*  --   --   HEPARINUNFRC  --  >1.10*  --   --   CREATININE 1.99*  --   --  1.50*  TROPONINIHS 34* 32*  --   --     Estimated Creatinine Clearance: 48.6 mL/min (A) (by C-G formula based on SCr of 1.5 mg/dL (H)).   Medical History: Past Medical History:  Diagnosis Date   Acute embolism and thombos unsp deep vn unsp lower extremity (HCC)    Acute respiratory failure (HCC)    AKI (acute kidney injury) (HCC) 12/27/2018   Allergy    Anemia    Aneurysm of unspecified site (HCC)    ARF (acute respiratory failure) (HCC)    H/O   Bronchitis    Cellulitis and abscess of leg 09/27/2019   CHF (congestive heart failure) (HCC)    COPD (chronic obstructive pulmonary disease) (HCC)    Cough    Epistaxis    Gastrointestinal hemorrhage    GERD (gastroesophageal reflux disease)    Gout    Hematemesis 04/27/2020   High anion gap metabolic acidosis 12/27/2018   Hyperlipidemia    Hypertension    Hypokalemia    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) 09/27/2019   Insomnia    Iron deficiency anemia 04/21/2017   Muscle contracture    MUSCLE SPASMS, muscle weakness   Peripheral vascular disease (HCC)    Pneumonia    Pressure ulcer    Severe sepsis (HCC) 12/27/2018   Toxic encephalopathy 12/27/2018    Pertinent Medications:  Apixaban 2.5mg  BID, last dose: patient  believes he took it this morning(~0800 on 8/20), Called [facility] but no one was available to assist.  Assessment: 72 year old male with history of bilateral AKA, morbid obesity, GERD, BPH, neuropathy, hyperlipidemia, depression, anxiety, peripheral atherosclerosis of the extremity arteries, insomnia, who presents to the emergency department from [facility] for chief concerns of shortness of breath. Patient takes Apixaban at home for history of DVT. Currently, he is having trouble swallowing. Pharmacy has been consulted to initiate heparin infusion. Baseline labs have been ordered and are pending.  Goal of Therapy:  Heparin level 0.3-0.7 units/ml Heparin level 66-102 units/ml Monitor platelets by anticoagulation protocol: Yes   08/21 0227 aPTT 61, subtherapeutic  Plan:  Bolus 1300 units x 1 Increase  heparin infusion to 1700 units/hr Recheck aPTT level in 8 hours and anti-Xa level daily while on heparin Will continue to dose via aPTT until both levels correlate, then will transition to ant-Xa dosing. Continue to monitor H&H and platelets  Otelia Sergeant, PharmD, [facility] 04/08/2023 3:20 AM

## 2023-04-08 NOTE — Assessment & Plan Note (Signed)
-   Continue home statin °

## 2023-04-08 NOTE — Consult Note (Signed)
Cardiology Consultation   Patient ID: RUDIS MERCURE MRN: [id number]; DOB: 26-Dec-1950  Admit date: 04/07/2023 Date of Consult: 04/08/2023  PCP:  [facility] [place], MD   [facility] Providers Cardiologist:  New     Patient Profile:   Timothy Juarez is a 72 y.o. male with a hx of PAD status post bilateral AKA's, recurrent DVTs, HFpEF, hypertension, hyperlipidemia, GI bleed, GERD, and COPD, who is being seen 04/08/2023 for the evaluation of abnormal EKG and at the request of Dr. Para March.  History of Present Illness:   Timothy Juarez presented to the [facility] emergency department yesterday due to shortness of breath.  He was reportedly hypoxic at [facility], prompting the facility to summon EMS.  He was given DuoNebs, magnesium, and Medrol by EMS and subsequently started on BiPAP.  Workup in the ED was notable for positive COVID-19.  This morning, nursing in the ICU noted ST elevation on telemetry.  This had been preceded by a dark, tarry bowel movement described by Timothy Juarez nurse as "melena."  At this time, Timothy Juarez indicates that he is feeling a little better.  He denies chest pain and abdominal pain.  I am unable to obtain additional history as the patient is on BiPAP and cannot speak more than 1 or 2 words at a time.   Past Medical History:  Diagnosis Date   Acute embolism and thombos unsp deep vn unsp lower extremity (HCC)    Acute respiratory failure (HCC)    AKI (acute kidney injury) (HCC) 12/27/2018   Allergy    Anemia    Aneurysm of unspecified site (HCC)    ARF (acute respiratory failure) (HCC)    H/O   Bronchitis    Cellulitis and abscess of leg 09/27/2019   CHF (congestive heart failure) (HCC)    COPD (chronic obstructive pulmonary disease) (HCC)    Cough    Epistaxis    Gastrointestinal hemorrhage    GERD (gastroesophageal reflux disease)    Gout    Hematemesis 04/27/2020   High anion gap metabolic acidosis 12/27/2018   Hyperlipidemia     Hypertension    Hypokalemia    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) 09/27/2019   Insomnia    Iron deficiency anemia 04/21/2017   Muscle contracture    MUSCLE SPASMS, muscle weakness   Peripheral vascular disease (HCC)    Pneumonia    Pressure ulcer    Severe sepsis (HCC) 12/27/2018   Toxic encephalopathy 12/27/2018    Past Surgical History:  Procedure Laterality Date   AMPUTATION Left 09/19/2015   Procedure: AMPUTATION BELOW KNEE;  Surgeon: Annice Needy, MD;  Location: [facility] ORS;  Service: Vascular;  Laterality: Left;   AMPUTATION Left 11/01/2015   Procedure: AMPUTATION ABOVE KNEE;  Surgeon: Annice Needy, MD;  Location: [facility] ORS;  Service: Vascular;  Laterality: Left;   AMPUTATION Left 09/29/2019   Procedure: IRRIGATION AND DEBRIDEMENT OF LEFT AKA;  Surgeon: Annice Needy, MD;  Location: [facility] ORS;  Service: General;  Laterality: Left;   AMPUTATION Right 07/18/2021   Procedure: AMPUTATION ABOVE KNEE;  Surgeon: Annice Needy, MD;  Location: [facility] ORS;  Service: Vascular;  Laterality: Right;   APPLICATION OF WOUND VAC Left 10/18/2015   Procedure: APPLICATION OF WOUND VAC;  Surgeon: Annice Needy, MD;  Location: [facility] ORS;  Service: Vascular;  Laterality: Left;   APPLICATION OF WOUND VAC Left 09/30/2019   Procedure: APPLICATION OF WOUND VAC;  Surgeon: Levora Dredge  G, MD;  Location: [facility] ORS;  Service: Vascular;  Laterality: Left;  serial # [id number]   CARDIAC CATHETERIZATION     CATARACT EXTRACTION W/PHACO Right 08/18/2019   Procedure: CATARACT EXTRACTION PHACO AND INTRAOCULAR LENS PLACEMENT (IOC) RIGHT;  Surgeon: Galen Manila, MD;  Location: [facility] ORS;  Service: Ophthalmology;  Laterality: Right;  [place] 03:29.0 CDE 45.68 Fluid Pack Lot # [id number]   CENTRAL VENOUS CATHETER INSERTION Right 09/30/2019   Procedure: INSERTION CENTRAL LINE ADULT;  Surgeon: Renford Dills, MD;  Location: [facility] ORS;  Service: Vascular;  Laterality: Right;   COLONOSCOPY WITH PROPOFOL N/A 05/25/2017    Procedure: COLONOSCOPY WITH PROPOFOL;  Surgeon: Wyline Mood, MD;  Location: [facility] ENDOSCOPY;  Service: Gastroenterology;  Laterality: N/A;   COLONOSCOPY WITH PROPOFOL N/A 10/14/2017   Procedure: COLONOSCOPY WITH PROPOFOL;  Surgeon: Wyline Mood, MD;  Location: [facility] ENDOSCOPY;  Service: Gastroenterology;  Laterality: N/A;   ESOPHAGOGASTRODUODENOSCOPY (EGD) WITH PROPOFOL N/A 05/25/2017   Procedure: ESOPHAGOGASTRODUODENOSCOPY (EGD) WITH PROPOFOL;  Surgeon: Wyline Mood, MD;  Location: [facility] ENDOSCOPY;  Service: Gastroenterology;  Laterality: N/A;   ESOPHAGOGASTRODUODENOSCOPY (EGD) WITH PROPOFOL N/A 10/14/2017   Procedure: ESOPHAGOGASTRODUODENOSCOPY (EGD) WITH PROPOFOL;  Surgeon: Wyline Mood, MD;  Location: [facility] ENDOSCOPY;  Service: Gastroenterology;  Laterality: N/A;   ESOPHAGOGASTRODUODENOSCOPY (EGD) WITH PROPOFOL N/A 04/27/2020   Procedure: ESOPHAGOGASTRODUODENOSCOPY (EGD) WITH PROPOFOL;  Surgeon: Toney Reil, MD;  Location: [facility] ENDOSCOPY;  Service: Gastroenterology;  Laterality: N/A;   ESOPHAGOGASTRODUODENOSCOPY (EGD) WITH PROPOFOL N/A 06/18/2020   Procedure: ESOPHAGOGASTRODUODENOSCOPY (EGD) WITH PROPOFOL;  Surgeon: Toney Reil, MD;  Location: [facility] ENDOSCOPY;  Service: Gastroenterology;  Laterality: N/A;   EYE SURGERY     GIVENS CAPSULE STUDY N/A 07/14/2017   Procedure: GIVENS CAPSULE STUDY;  Surgeon: Wyline Mood, MD;  Location: [facility] ENDOSCOPY;  Service: Gastroenterology;  Laterality: N/A;   GIVENS CAPSULE STUDY N/A 10/30/2017   Procedure: GIVENS CAPSULE STUDY 12 HR;  Surgeon: Wyline Mood, MD;  Location: [facility] ENDOSCOPY;  Service: Gastroenterology;  Laterality: N/A;   IVC FILTER INSERTION N/A 10/12/2017   Procedure: IVC FILTER INSERTION;  Surgeon: Annice Needy, MD;  Location: [facility] INVASIVE CV LAB;  Service: Cardiovascular;  Laterality: N/A;   LOWER EXTREMITY ANGIOGRAPHY Right 10/04/2018   Procedure: LOWER EXTREMITY ANGIOGRAPHY;  Surgeon: Annice Needy, MD;  Location: [facility] INVASIVE CV LAB;   Service: Cardiovascular;  Laterality: Right;   LOWER EXTREMITY ANGIOGRAPHY Right 03/21/2021   Procedure: LOWER EXTREMITY ANGIOGRAPHY;  Surgeon: Annice Needy, MD;  Location: [facility] INVASIVE CV LAB;  Service: Cardiovascular;  Laterality: Right;   LOWER EXTREMITY ANGIOGRAPHY Right 05/23/2021   Procedure: LOWER EXTREMITY ANGIOGRAPHY;  Surgeon: Annice Needy, MD;  Location: [facility] INVASIVE CV LAB;  Service: Cardiovascular;  Laterality: Right;   PERIPHERAL VASCULAR CATHETERIZATION Left 01/18/2015   Procedure: Lower Extremity Angiography;  Surgeon: Annice Needy, MD;  Location: [facility] INVASIVE CV LAB;  Service: Cardiovascular;  Laterality: Left;   PERIPHERAL VASCULAR CATHETERIZATION N/A 01/18/2015   Procedure: Lower Extremity Intervention;  Surgeon: Annice Needy, MD;  Location: [facility] INVASIVE CV LAB;  Service: Cardiovascular;  Laterality: N/A;   PERIPHERAL VASCULAR CATHETERIZATION  07/30/2015   Procedure: Lower Extremity Intervention;  Surgeon: Annice Needy, MD;  Location: [facility] INVASIVE CV LAB;  Service: Cardiovascular;;   PERIPHERAL VASCULAR CATHETERIZATION N/A 07/30/2015   Procedure: Abdominal Aortogram w/Lower Extremity;  Surgeon: Annice Needy, MD;  Location: [facility] INVASIVE CV LAB;  Service: Cardiovascular;  Laterality: N/A;   PERIPHERAL VASCULAR CATHETERIZATION Left 08/22/2015   Procedure: Lower Extremity Angiography;  Surgeon: Annice Needy, MD;  Location: [facility] INVASIVE CV LAB;  Service: Cardiovascular;  Laterality: Left;   PERIPHERAL VASCULAR CATHETERIZATION Left 08/22/2015   Procedure: Lower Extremity Intervention;  Surgeon: Annice Needy, MD;  Location: [facility] INVASIVE CV LAB;  Service: Cardiovascular;  Laterality: Left;   PERIPHERAL VASCULAR CATHETERIZATION Right 03/31/2016   Procedure: Lower Extremity Angiography;  Surgeon: Annice Needy, MD;  Location: [facility] INVASIVE CV LAB;  Service: Cardiovascular;  Laterality: Right;   PERIPHERAL VASCULAR CATHETERIZATION  03/31/2016   Procedure: Lower Extremity Intervention;  Surgeon: Annice Needy, MD;  Location: [facility] INVASIVE CV LAB;  Service: Cardiovascular;;   PERIPHERAL VASCULAR CATHETERIZATION Left 04/10/2016   Procedure: Lower Extremity Angiography;  Surgeon: Annice Needy, MD;  Location: [facility] INVASIVE CV LAB;  Service: Cardiovascular;  Laterality: Left;   VACUUM ASSISTED CLOSURE CHANGE Left 10/03/2019   Procedure: LEFT THIGH VACUUM ASSISTED CLOSURE CHANGE;  Surgeon: Annice Needy, MD;  Location: [facility] ORS;  Service: General;  Laterality: Left;   WOUND DEBRIDEMENT Left 10/18/2015   Procedure: DEBRIDEMENT WOUND   ( LEFT BKA DEBRIDEMENT );  Surgeon: Annice Needy, MD;  Location: [facility] ORS;  Service: Vascular;  Laterality: Left;     Home Medications:  Prior to Admission medications   Medication Sig Start Date Brecklyn Galvis Date Taking? Authorizing Provider  acetaminophen (TYLENOL) 325 MG tablet Take 650 mg by mouth every 4 (four) hours as needed for fever.    Yes [provider]  ALPHAGAN P 0.1 % SOLN Place 1 drop into the right eye 3 (three) times daily. 03/14/23  Yes [provider]  apixaban (ELIQUIS) 2.5 MG TABS tablet Take 1 tablet (2.5 mg total) by mouth 2 (two) times daily. 05/10/20  Yes Rickard Patience, MD  ascorbic acid (VITAMIN C) 500 MG tablet Take 500 mg by mouth 2 (two) times daily.   Yes [provider]  aspirin EC 81 MG EC tablet Take 1 tablet (81 mg total) by mouth daily. Swallow whole. 03/23/21  Yes Stegmayer, Cala Bradford A, PA-C  atorvastatin (LIPITOR) 10 MG tablet Take 1 tablet (10 mg total) by mouth daily. Patient taking differently: Take 5 mg by mouth daily. 10/04/18  Yes Dew, Marlow Baars, MD  baclofen (LIORESAL) 10 MG tablet Take 10 mg by mouth 3 (three) times daily. Hold for sedation   Yes [provider]  Cholecalciferol (VITAMIN D3) 50 MCG (2000 UT) TABS Take 4,000 Int'l Units by mouth daily. 01/15/23  Yes [provider]  docusate sodium (COLACE) 100 MG capsule Take 100 mg by mouth daily as needed for mild constipation.   Yes [provider]   DULoxetine (CYMBALTA) 20 MG capsule Take 40 mg by mouth daily. 04/02/23  Yes [provider]  fentaNYL (DURAGESIC) 25 MCG/HR Place 1 patch onto the skin every 3 (three) days.  10/13/19  Yes [provider]  ferrous sulfate 324 (65 Fe) MG TBEC Take 324 mg by mouth 2 (two) times daily.    Yes [provider]  Fluticasone-Umeclidin-Vilant 100-62.5-25 MCG/ACT AEPB Inhale 1 puff into the lungs every morning.   Yes [provider]  furosemide (LASIX) 40 MG tablet Take 40 mg by mouth daily.   Yes [provider]  gabapentin (NEURONTIN) 100 MG capsule Take 100 mg by mouth every 12 (twelve) hours.   Yes [provider]  GOODSENSE ARTIFICIAL TEARS 0.5-0.6 % SOLN Place into the right eye 4 (four) times daily. 12/11/22  Yes [provider]  guaiFENesin (ROBITUSSIN) 100  MG/5ML SOLN Take 15 mLs by mouth 3 (three) times daily as needed for cough (congestion).   Yes [provider]  ketorolac (ACULAR) 0.4 % SOLN Place 1 drop into the right eye 2 (two) times daily. 03/15/21  Yes [provider]  loperamide (IMODIUM) 2 MG capsule Take 2 mg by mouth 3 (three) times daily as needed for diarrhea or loose stools.   Yes [provider]  nitroGLYCERIN (NITROSTAT) 0.4 MG SL tablet Place 0.14 mg under the tongue every 5 (five) minutes as needed for chest pain.   Yes [provider]  Omega-3 Fatty Acids (FISH OIL) 1000 MG CAPS Take 1,000 mg by mouth daily.    Yes [provider]  Oxycodone HCl 10 MG TABS Take 10 mg by mouth every 4 (four) hours as needed (severe pain).   Yes [provider]  oxymetazoline (AFRIN) 0.05 % nasal spray Place 2 sprays into both nostrils 2 (two) times daily as needed (For nose bleeds).   Yes [provider]  potassium chloride SA (K-DUR,KLOR-CON) 20 MEQ tablet Take 20 mEq by mouth daily.   Yes [provider]  sodium chloride (OCEAN) 0.65 % SOLN nasal spray Place 1  spray into both nostrils 2 (two) times daily as needed for congestion.   Yes [provider]  spiritus frumenti (ETHYL ALCOHOL) SOLN 3 oz of bourbon by mouth at bed time as needed for sleep, pleasure.   Yes [provider]  tamsulosin (FLOMAX) 0.4 MG CAPS capsule Take 1 capsule (0.4 mg total) by mouth daily. 10/06/19  Yes Delfino Lovett, MD  timolol (TIMOPTIC) 0.5 % ophthalmic solution Place 1 drop into the right eye daily. 02/16/23  Yes [provider]  acidophilus (RISAQUAD) CAPS capsule Take 1 capsule by mouth 2 (two) times daily. Patient not taking: Reported on 04/08/2023    [provider]  brimonidine-timolol (COMBIGAN) 0.2-0.5 % ophthalmic solution Place 1 drop into the right eye 2 (two) times daily. Patient not taking: Reported on 04/08/2023    [provider]  pantoprazole (PROTONIX) 40 MG tablet Take 1 tablet (40 mg total) by mouth 2 (two) times daily. Patient not taking: Reported on 04/08/2023 04/30/20   Charise Killian, MD    Inpatient Medications: Scheduled Meds:  Chlorhexidine Gluconate Cloth  6 each Topical Daily   methylPREDNISolone (SOLU-MEDROL) injection  125 mg Intravenous Daily   pantoprazole (PROTONIX) IV  40 mg Intravenous Q12H   Continuous Infusions:  ceFEPime (MAXIPIME) IV Stopped (04/08/23 0434)   doxycycline (VIBRAMYCIN) IV     lactated ringers 150 mL/hr (04/08/23 0700)   PRN Meds: acetaminophen **OR** acetaminophen, hydrALAZINE, metoprolol tartrate, ondansetron **OR** ondansetron (ZOFRAN) IV, polyethylene glycol, senna-docusate  Allergies:   No Known Allergies  Social History:   Social History   Tobacco Use   Smoking status: Former    Current packs/day: 0.00    Average packs/day: 1 pack/day for 44.0 years (44.0 ttl pk-yrs)    Types: Cigarettes    Start date: 11/17/1968    Quit date: 11/17/2012    Years since quitting: 10.3   Smokeless tobacco: Never  Vaping Use   Vaping status: Never Used  Substance Use Topics    Alcohol use: Not Currently   Drug use: Never     Family History:   Family History  Problem Relation Age of Onset   Heart attack Mother    Varicose Veins Neg Hx      ROS:  Unable to obtain due to critical illness.  Physical Exam/Data:   Vitals:   04/08/23 0200 04/08/23 0500 04/08/23 0550 04/08/23 0630  BP: 137/67 (!) 123/57 (!) 132/90 120/67  Pulse: (!) 110 89 (!) 113 96  Resp: (!) 30 16 (!) 26 20  Temp: 97.8 F (36.6 C)  (!) 97.3 F (36.3 C)   TempSrc: Oral  Axillary   SpO2: 97% 97% 98% 96%  Weight:      Height:        Intake/Output Summary (Last 24 hours) at 04/08/2023 0736 Last data filed at 04/08/2023 0700 Gross per 24 hour  Intake 2280.12 ml  Output 880 ml  Net 1400.12 ml      04/07/2023    3:11 PM 04/06/2023    9:52 AM 10/07/2021   10:45 AM  Last 3 Weights  Weight (lbs) 214 lb 11.2 oz 224 lb 224 lb  Weight (kg) 97.387 kg 101.606 kg 101.606 kg     Body mass index is 34.65 kg/m.  General: Elderly man lying in bed with BiPAP mask in place.  He appears comfortable. HEENT: BiPAP mask in place. Neck: Unable to assess JVP due to positioning and BiPAP mask straps. Cardiac: Regular rate and rhythm without murmurs. Lungs: Coarse breath sounds anteriorly. Abd: soft, nontender, no hepatomegaly  Ext: Patient status post bilateral AKA's.  No edema noted in either thigh. Musculoskeletal:  No deformities, BUE and BLE strength normal and equal Skin: warm and dry  Neuro: Somnolent but awakens to voice.  Able to move upper and lower extremities. Psych: Unable to assess due to critical illness.  EKG:  The EKG was personally reviewed and demonstrates: Baseline wander.  Normal sinus rhythm with up to 1 mm of inferior ST segment elevation and some beats.  Question mild PR depression. Telemetry:  Telemetry was personally reviewed and demonstrates: Normal sinus rhythm with PACs.  Relevant CV Studies: None  Laboratory Data:  High Sensitivity Troponin:   Recent Labs  Lab  04/07/23 1515 04/07/23 1819  TROPONINIHS 34* 32*     Chemistry Recent Labs  Lab 04/07/23 1515 04/08/23 0231  NA 132* 135  K 3.8 3.8  CL 95* 104  CO2 21* 18*  GLUCOSE 238* 200*  BUN 39* 35*  CREATININE 1.99* 1.50*  CALCIUM 8.1* 7.4*  GFRNONAA 35* 49*  ANIONGAP 16* 13    Recent Labs  Lab 04/07/23 1515  PROT 6.8  ALBUMIN 2.8*  AST 46*  ALT 25  ALKPHOS 66  BILITOT 3.7*   Lipids No results for input(s): "CHOL", "TRIG", "HDL", "LABVLDL", "LDLCALC", "CHOLHDL" in the last 168 hours.  Hematology Recent Labs  Lab 04/07/23 1515 04/08/23 0231  WBC 24.3* 26.7*  RBC 6.16* 5.17  HGB 16.2 13.7  HCT 48.7 42.2  MCV 79.1* 81.6  MCH 26.3 26.5  MCHC 33.3 32.5  RDW 14.9 14.9  PLT 159 145*   Thyroid No results for input(s): "TSH", "FREET4" in the last 168 hours.  BNP Recent Labs  Lab 04/07/23 1515  BNP 262.1*    DDimer No results for input(s): "DDIMER" in the last 168 hours.   Radiology/Studies:  DG Chest Port 1 View  Result Date: 04/08/2023 CLINICAL DATA:  72 year old male with history of shortness of breath. EXAM: PORTABLE CHEST 1 VIEW COMPARISON:  Chest x-ray 04/07/2023. FINDINGS: Dense consolidation in the right upper lobe. Worsening patchy consolidation also noted in the apical portion of the right upper lobe and in the perihilar aspect of the right mid to lower lung, partially obscuring the right heart border, suggesting developing  right middle lobe involvement as well. Widespread areas of interstitial prominence and architectural distortion in the left lung, similar to prior examinations, likely reflective of areas of chronic postinfectious scarring and bronchiectasis, better demonstrated on prior chest CTs. No pleural effusions. No pneumothorax. No evidence of pulmonary edema. Heart size is normal. Mediastinal contours are distorted by patient positioning. IMPRESSION: 1. Worsening multilobar right-sided pneumonia, most severe in the right upper lobe, as above.  Electronically Signed   By: Trudie Reed M.D.   On: 04/08/2023 07:01   DG Chest Portable 1 View  Result Date: 04/07/2023 CLINICAL DATA:  Shortness of breath EXAM: PORTABLE CHEST 1 VIEW COMPARISON:  Chest radiograph dated 04/26/2020, CT chest dated 04/27/2020 FINDINGS: Normal lung volumes. Right upper lung consolidation. Left upper lung linear scarring. No pleural effusion or pneumothorax. The heart size and mediastinal contours are within normal limits. No acute osseous abnormality. IMPRESSION: Right upper lung consolidation, suspicious for pneumonia in the acute clinical setting. Electronically Signed   By: Agustin Cree M.D.   On: 04/07/2023 16:51     Assessment and Plan:   Abnormal EKG with concern for inferior STEMI: ST elevation was incidentally noted on telemetry monitor this morning.  Patient does not complain of any chest pain, though in the setting of his COVID-19 infection and respiratory failure, it is challenging to obtain a history.  EKG findings could be due to an acute coronary occlusion, the subtle PR depression raise the possibility of pericarditis as well.  Timothy Juarez and I briefly discussed proceeding with cardiac catheterization but agreed to defer this.  I believe the risks outweigh potential benefits at this time given his respiratory failure and reported melena this morning with a 2.5 gm drop in hemoglobin over the course of 11 hours.  This even prompted heparin to be discontinued.  I recommend trending the patient's hemoglobin and conferring with gastroenterology.  It may be beneficial to escalate his PPI.  If there is no further evidence of bleeding, it would be worthwhile to challenge Timothy Juarez with heparin and aspirin before considering cardiac catheterization. -Defer cath at this time. -Trend hemoglobin.  If stable without further melena, recommend addition of aspirin and heparin. -Obtain echocardiogram. -Initiate high-intensity statin therapy when patient is able to  take oral medications on a consistent basis.  Melena: Chart indicates a history of prior GI bleeding.  Nurses report melena this morning with 2.5 g drop in hemoglobin from admission labs yesterday.  Heparin now on hold. -Agree with holding heparin. -Continue pantoprazole 40 mg IV twice daily. -Consider consultation with gastroenterology. -Trend hemoglobin  Acute respiratory failure with hypoxia and COVID-19: -Continue BiPAP and supportive measures per internal medicine.  Acute kidney injury: Creatinine improving but still well above baseline. -Continue gentle hydration and avoidance of nephrotoxic agents, if possible.  History of DVT: On apixaban at home, currently on hold with melena. -Resume heparin infusion when safe to do so.  For questions or updates, please contact [facility] Please consult [email or web address] for contact info under [facility].  Signed, Yvonne Kendall, MD  04/08/2023 7:36 AM

## 2023-04-08 NOTE — Progress Notes (Addendum)
       CROSS COVER NOTE  NAME: CASSANOVA VINSON MRN: [id number] DOB : [birth year]    Concern as stated by nurse / staff   hey DR Para March patient is here for COVID.Marland KitchenMarland Kitchenpatient is on a Hep drip for history of DVT.Marland KitchenMarland KitchenMarland KitchenHe just arrived to the unit and ad a medium dark tarry stool. IT looks likes hhis Hgb has drop from 16 to 13 on his morning labs   the monitor is also ringing out for ST elevation.Marland KitchenMarland KitchenMarland KitchenI'm gonna grab an EKG    Pertinent findings on chart review:    04/08/2023    5:50 AM 04/08/2023    5:00 AM 04/08/2023    2:00 AM  Vitals with BMI  Systolic 132 123 409  Diastolic 90 57 67  Pulse 113 89 110    H&P from 8/20 reviewed: Patient admitted with severe sepsis secondary to CAP/COVID-positive Started on heparin drip to replace home Eliquis while on BiPAP  Patient assessment Patient lying flat comfortably, on BiPAP, arouses easily to voice.  He denies chest pain  Review of Systems  Cardiovascular:  Negative for chest pain and palpitations.  Gastrointestinal:  Negative for abdominal pain.    Physical Exam Vitals and nursing note reviewed.  Constitutional:      Comments: On BiPAP but appears comfortable  HENT:     Head: Normocephalic and atraumatic.  Cardiovascular:     Rate and Rhythm: Regular rhythm. Tachycardia present.     Heart sounds: Normal heart sounds.  Pulmonary:     Effort: Pulmonary effort is normal. Tachypnea present.     Breath sounds: Normal breath sounds.     Comments: Coarse breath sounds throughout Abdominal:     Palpations: Abdomen is soft.     Tenderness: There is no abdominal tenderness.  Neurological:     Mental Status: Mental status is at baseline.    EKG interpretation: Question ST elevation inferior and lateral leads. .  Assessment and  Interventions   Assessment:  #1 :ABNORMAL EKG: ST elevation  -Spoke with Dr. Okey Dupre STEMI on-call who reviewed the EKG and requested EKG be repeated stat.  Will see patient -Troponin ordered -Cardiology consult  placed -Signing out to incoming provider, Dr Nelson Chimes due to change of shift  #2 Melena stool  Possible acute blood loss anemia Chronic anticoagulation due to recurrent DVT (on heparin drip to replace home Eliquis while on BiPAP)   Plan: Occult blood to lab for verification Serial H&H Hold heparin for now pending GI consult Consult GI       CRITICAL CARE Performed by: Andris Baumann   Total critical care time: 60 minutes  Critical care time was exclusive of separately billable procedures and treating other patients.  Critical care was necessary to treat or prevent imminent or life-threatening deterioration.  Critical care was time spent personally by me on the following activities: development of treatment plan with patient and/or surrogate as well as nursing, discussions with consultants, evaluation of patient's response to treatment, examination of patient, obtaining history from patient or surrogate, ordering and performing treatments and interventions, ordering and review of laboratory studies, ordering and review of radiographic studies, pulse oximetry and re-evaluation of patient's condition.

## 2023-04-09 DIAGNOSIS — Z8616 Personal history of COVID-19: Secondary | ICD-10-CM

## 2023-04-09 DIAGNOSIS — I70229 Atherosclerosis of native arteries of extremities with rest pain, unspecified extremity: Secondary | ICD-10-CM

## 2023-04-09 DIAGNOSIS — I10 Essential (primary) hypertension: Secondary | ICD-10-CM

## 2023-04-09 DIAGNOSIS — A419 Sepsis, unspecified organism: Secondary | ICD-10-CM | POA: Diagnosis not present

## 2023-04-09 DIAGNOSIS — I5032 Chronic diastolic (congestive) heart failure: Secondary | ICD-10-CM | POA: Diagnosis not present

## 2023-04-09 DIAGNOSIS — U071 COVID-19: Secondary | ICD-10-CM | POA: Diagnosis not present

## 2023-04-09 DIAGNOSIS — J9601 Acute respiratory failure with hypoxia: Secondary | ICD-10-CM | POA: Diagnosis not present

## 2023-04-09 DIAGNOSIS — Z89611 Acquired absence of right leg above knee: Secondary | ICD-10-CM

## 2023-04-09 DIAGNOSIS — Z89612 Acquired absence of left leg above knee: Secondary | ICD-10-CM

## 2023-04-09 LAB — COMPREHENSIVE METABOLIC PANEL
ALT: 24 U/L (ref 0–44)
AST: 23 U/L (ref 15–41)
Albumin: 1.9 g/dL — ABNORMAL LOW (ref 3.5–5.0)
Alkaline Phosphatase: 47 U/L (ref 38–126)
Anion gap: 8 (ref 5–15)
BUN: 32 mg/dL — ABNORMAL HIGH (ref 8–23)
CO2: 20 mmol/L — ABNORMAL LOW (ref 22–32)
Calcium: 7.6 mg/dL — ABNORMAL LOW (ref 8.9–10.3)
Chloride: 111 mmol/L (ref 98–111)
Creatinine, Ser: 1.06 mg/dL (ref 0.61–1.24)
GFR, Estimated: >60 mL/min (ref >60)
Glucose, Bld: 149 mg/dL — ABNORMAL HIGH (ref 70–99)
Potassium: 3.2 mmol/L — ABNORMAL LOW (ref 3.5–5.1)
Sodium: 139 mmol/L (ref 135–145)
Total Bilirubin: 1.8 mg/dL — ABNORMAL HIGH (ref 0.3–1.2)
Total Protein: 5.2 g/dL — ABNORMAL LOW (ref 6.5–8.1)

## 2023-04-09 LAB — HEPARIN LEVEL (UNFRACTIONATED): Heparin Unfractionated: >1.1 [IU]/mL — ABNORMAL HIGH (ref 0.30–0.70)

## 2023-04-09 LAB — CBC
HCT: 35.7 % — ABNORMAL LOW (ref 39.0–52.0)
Hemoglobin: 12.1 g/dL — ABNORMAL LOW (ref 13.0–17.0)
MCH: 26.4 pg (ref 26.0–34.0)
MCHC: 33.9 g/dL (ref 30.0–36.0)
MCV: 77.8 fL — ABNORMAL LOW (ref 80.0–100.0)
Platelets: 137 10*3/uL — ABNORMAL LOW (ref 150–400)
RBC: 4.59 MIL/uL (ref 4.22–5.81)
RDW: 14.7 % (ref 11.5–15.5)
WBC: 24.1 10*3/uL — ABNORMAL HIGH (ref 4.0–10.5)
nRBC: 0 % (ref 0.0–0.2)

## 2023-04-09 LAB — APTT
aPTT: 95 s — ABNORMAL HIGH (ref 24–36)
aPTT: 73 seconds — ABNORMAL HIGH (ref 24–36)

## 2023-04-09 LAB — GLUCOSE, CAPILLARY: Glucose-Capillary: 175 mg/dL — ABNORMAL HIGH (ref 70–99)

## 2023-04-09 LAB — PROCALCITONIN: Procalcitonin: 28.28 ng/mL

## 2023-04-09 LAB — C-REACTIVE PROTEIN: CRP: 24.4 mg/dL — ABNORMAL HIGH (ref <1.0)

## 2023-04-09 MED ORDER — POTASSIUM CHLORIDE 10 MEQ/100ML IV SOLN
10.0000 meq | INTRAVENOUS | Status: AC
  Administered 2023-04-09 (×2): 10 meq via INTRAVENOUS
  Filled 2023-04-09 (×2): qty 100

## 2023-04-09 MED ORDER — APIXABAN 5 MG PO TABS
5.0000 mg | ORAL_TABLET | Freq: Two times a day (BID) | ORAL | Status: DC
  Administered 2023-04-09 – 2023-04-13 (×8): 5 mg via ORAL
  Filled 2023-04-09 (×8): qty 1

## 2023-04-09 MED ORDER — VANCOMYCIN HCL 1250 MG/250ML IV SOLN
1250.0000 mg | INTRAVENOUS | Status: DC
  Administered 2023-04-09 – 2023-04-11 (×3): 1250 mg via INTRAVENOUS
  Filled 2023-04-09 (×3): qty 250

## 2023-04-09 NOTE — Assessment & Plan Note (Addendum)
Suspect secondary to severe sepsis, improved -Encourage p.o. hydration -Monitor renal function -Avoid nephrotoxins

## 2023-04-09 NOTE — Consult Note (Signed)
PHARMACY CONSULT NOTE - ELECTROLYTES  Pharmacy Consult for Electrolyte Monitoring and Replacement   Recent Labs: Height: 5\' 6"  (167.6 cm) Weight: 97.4 kg (214 lb 11.2 oz) IBW/kg (Calculated) : 63.8 Estimated Creatinine Clearance: 68.8 mL/min (by C-G formula based on SCr of 1.06 mg/dL). Potassium (mmol/L)  Date Value  04/09/2023 3.2 (L)  12/01/2013 3.9   Magnesium (mg/dL)  Date Value  95/62/1308 2.1  04/01/2013 2.2   Calcium (mg/dL)  Date Value  65/78/4696 7.6 (L)   Calcium, Total (mg/dL)  Date Value  29/52/8413 9.4   Albumin (g/dL)  Date Value  24/40/1027 1.9 (L)  03/25/2013 1.2 (L)   Phosphorus (mg/dL)  Date Value  25/36/6440 2.7  03/29/2013 3.9   Sodium (mmol/L)  Date Value  04/09/2023 139  12/01/2013 136   Corrected Ca: 9.28 mg/dL  Assessment  Timothy Juarez is a 72 y.o. male presenting with shortness of breath. PMH significant for bilateral AKA, morbid obesity, GERD, BPH, neuropathy, hyperlipidemia, depression, anxiety, peripheral atherosclerosis of extremity arteries, insomnia. Pharmacy has been consulted to monitor and replace electrolytes.  Diet: Dysphagia 3 diet Pertinent medications: N/A  Goal of Therapy: Electrolytes WNL  Plan:  KCL IV x 2 Check BMP, Mg, Phos with AM labs  Thank you for allowing pharmacy to be a part of this patient's care.  Bettey Costa, PharmD Clinical Pharmacist 04/09/2023 7:45 AM

## 2023-04-09 NOTE — Assessment & Plan Note (Addendum)
With multiple organ involvement, renal and pulmonary  suspect secondary to community-acquired pneumonia/MRSA pneumonia given with wedge shaped consolidation on chest x-ray and viral infection (COVID PCR +), MRSA PCR positive. Significantly elevated procalcitonin at 47.15>>28.80>>4.91.  Blood cultures remain negative.Sputum cultures with MRSA, worsening leukocytosis again , we added ceftriaxone yesterday, clinically seems improving so most likely some effect with concurrent steroid use which has been discontinued today -Continue ceftriaxone -Continue with vancomycin -

## 2023-04-09 NOTE — Care Management Important Message (Signed)
Important Message  Patient Details  Name: Timothy Juarez MRN: [id number] Date of Birth: 02-Dec-1950   Medicare Important Message Given:  N/A - LOS <3 / Initial given by admissions     Johnell Comings 04/09/2023, 5:43 PM

## 2023-04-09 NOTE — Assessment & Plan Note (Addendum)
BNP mildly elevated at 138.  Echocardiogram without any significant abnormality. Strict I's and O's -Ordered 1 dose of IV Lasix today -Restart home Lasix from tomorrow

## 2023-04-09 NOTE — Assessment & Plan Note (Addendum)
Suspect secondary to community acquired pneumonia, started worsening again after stopping cefepime yesterday. -Continue to monitor

## 2023-04-09 NOTE — Consult Note (Signed)
Pharmacy Antibiotic Note  Timothy Juarez is a 72 y.o. male admitted on 04/07/2023 with  shortness of breath .  Pharmacy has been consulted for Vancomycin and Cefepime dosing. Patient also on Doxycycline for atypical pneumonia coverage.  Renal function has stabilized and returned to baseline. Will transition to AUC dosing.  Plan: Vancomycin 1250 mg IV Q 24 hrs. Goal AUC 400-550. Expected AUC: 497.6 Expected Css: 11.3 SCr used: 1.06, Vd used: 0.5   Cefepime 2g IV Q8 hours  Height: 5\' 6"  (167.6 cm) Weight: 97.4 kg (214 lb 11.2 oz) IBW/kg (Calculated) : 63.8  Temp (24hrs), Avg:97.8 F (36.6 C), Min:97 F (36.1 C), Max:98.5 F (36.9 C)  Recent Labs  Lab 04/07/23 1515 04/07/23 1520 04/07/23 1819 04/07/23 2113 04/08/23 0231 04/08/23 1816 04/09/23 0243  WBC 24.3*  --   --   --  26.7*  --  24.1*  CREATININE 1.99*  --   --   --  1.50*  --  1.06  LATICACIDVEN  --  3.8* 3.8* 3.1*  --   --   --   VANCORANDOM  --   --   --   --   --  8  --     Estimated Creatinine Clearance: 68.8 mL/min (by C-G formula based on SCr of 1.06 mg/dL).    No Known Allergies  Antimicrobials this admission: Vanc 8/20 >>  Cefepime 8/20 >>  Doxycycline 8/21 >>  Dose adjustments this admission: N/A  Microbiology results: 8/20 BCx: NGTD 8/21 Sputum cx: NG 8/21 Resp cx: pending 8/20 MRSA PCR: POSITIVE  Thank you for allowing pharmacy to be a part of this patient's care.  Ciara Kagan A Shaylee Stanislawski 04/09/2023 1:35 PM

## 2023-04-09 NOTE — Assessment & Plan Note (Signed)
Airborne and contact precaution, symptomatic support Patient received Solu-Medrol with EMS at 125 mg IV prior to ED presentation Solu-Medrol 125 mg IV daily, 2 doses ordered for 04/08/2023. Significantly elevated CRP -Started on prednisone today

## 2023-04-09 NOTE — Progress Notes (Addendum)
Good day. Watched movies. Denied pain. Breathing much better. Down to 2L nasal cannula. 1825 Report called to Jorg RN on 2C 1835 Transferred to room 228 via clean bed.

## 2023-04-09 NOTE — Consult Note (Signed)
ANTICOAGULATION CONSULT NOTE  Pharmacy Consult for Heparin Infusion Indication:  history of DVT  No Known Allergies  Patient Measurements: Height: 5\' 6"  (167.6 cm) Weight: 97.4 kg (214 lb 11.2 oz) IBW/kg (Calculated) : 63.8 Heparin Dosing Weight: 85 kg  Vital Signs: Temp: 98.5 F (36.9 C) (08/22 0800) Temp Source: Oral (08/22 0200) BP: 118/69 (08/22 1200) Pulse Rate: 75 (08/22 1200)  Labs: Recent Labs    04/07/23 1515 04/07/23 1515 04/07/23 1819 04/08/23 0227 04/08/23 0231 04/08/23 0741 04/08/23 0918 04/08/23 1108 04/08/23 2348 04/09/23 0243 04/09/23 1000  HGB 16.2  --   --   --  13.7  --   --  13.2  --  12.1*  --   HCT 48.7  --   --   --  42.2  --   --   --   --  35.7*  --   PLT 159  --   --   --  145*  --   --   --   --  137*  --   APTT  --    < > 40* 61*  --   --   --   --  95*  --  73*  LABPROT  --   --  23.7*  --   --   --   --   --   --   --   --   INR  --   --  2.1*  --   --   --   --   --   --   --   --   HEPARINUNFRC  --   --  >1.10*  --   --   --   --   --   --   --  >1.10*  CREATININE 1.99*  --   --   --  1.50*  --   --   --   --  1.06  --   TROPONINIHS 34*  --  32*  --   --  15 18*  --   --   --   --    < > = values in this interval not displayed.    Estimated Creatinine Clearance: 68.8 mL/min (by C-G formula based on SCr of 1.06 mg/dL).   Medical History: Past Medical History:  Diagnosis Date   Acute embolism and thombos unsp deep vn unsp lower extremity (HCC)    Acute respiratory failure (HCC)    AKI (acute kidney injury) (HCC) 12/27/2018   Allergy    Anemia    Aneurysm of unspecified site (HCC)    ARF (acute respiratory failure) (HCC)    H/O   Bronchitis    Cellulitis and abscess of leg 09/27/2019   CHF (congestive heart failure) (HCC)    COPD (chronic obstructive pulmonary disease) (HCC)    Cough    Epistaxis    Gastrointestinal hemorrhage    GERD (gastroesophageal reflux disease)    Gout    Hematemesis 04/27/2020   High anion gap  metabolic acidosis 12/27/2018   Hyperlipidemia    Hypertension    Hypokalemia    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) 09/27/2019   Insomnia    Iron deficiency anemia 04/21/2017   Muscle contracture    MUSCLE SPASMS, muscle weakness   Peripheral vascular disease (HCC)    Pneumonia    Pressure ulcer    Severe sepsis (HCC) 12/27/2018   Toxic encephalopathy 12/27/2018    Pertinent Medications:  Apixaban 2.5mg  BID,  last dose: patient believes he took it this morning(~0800 on 8/20), Called [facility] but no one was available to assist.  Assessment: 72 year old male with history of bilateral AKA, morbid obesity, GERD, BPH, neuropathy, hyperlipidemia, depression, anxiety, peripheral atherosclerosis of the extremity arteries, insomnia, who presents to the emergency department from [facility] for chief concerns of shortness of breath. Patient takes Apixaban at home for history of DVT. Currently, he is having trouble swallowing. Pharmacy has been consulted to initiate heparin infusion. Overnight patient had an episode of melena so heparin infusion was stopped and is now being resumed  Goal of Therapy:  Heparin level 0.3-0.7 units/ml Heparin level 66-102 units/ml Monitor platelets by anticoagulation protocol: Yes  8/21 2348 aPTT 95, therapeutic x 1 8/22 1000 aPTT 73, therapeutic x 2(HL>1.1, not correlating)   Plan:  ---Continue heparin infusion at 1700 units/hr ---Recheck aPTT and anti-Xa level with AM labs ---Will continue to dose via aPTT until both levels correlate, then will transition to ant-Xa dosing. ---Continue to monitor H&H and platelets  Bettey Costa, PharmD Clinical Pharmacist 04/09/2023 1:32 PM

## 2023-04-09 NOTE — Progress Notes (Signed)
Rounding Note    Patient Name: Timothy Juarez Date of Encounter: 04/09/2023  [facility] Cardiologist: [place]-Dr. End  Subjective   Reports feeling better, denies chest pain Still with residual cough Reports he has had COVID 3 times Off BiPAP Saturations in low 90s on nasal cannula Echocardiogram yesterday with no pericardial effusion, normal LV function Telemetry reviewed, ST abnormality markedly improved  Inpatient Medications    Scheduled Meds:  aspirin EC  81 mg Oral Daily   atorvastatin  10 mg Oral Daily   brimonidine  1 drop Right Eye TID   Chlorhexidine Gluconate Cloth  6 each Topical Daily   DULoxetine  40 mg Oral Daily   ferrous sulfate  324 mg Oral BID WC   fluticasone furoate-vilanterol  1 puff Inhalation Daily   And   umeclidinium bromide  1 puff Inhalation Daily   gabapentin  100 mg Oral Q12H   ketorolac  1 drop Right Eye BID   pantoprazole (PROTONIX) IV  40 mg Intravenous Q12H   predniSONE  50 mg Oral Q breakfast   tamsulosin  0.4 mg Oral Daily   timolol  1 drop Right Eye Daily   vancomycin variable dose per unstable renal function (pharmacist dosing)   Does not apply See admin instructions   Continuous Infusions:  ceFEPime (MAXIPIME) IV 2 g (04/09/23 0905)   doxycycline (VIBRAMYCIN) IV Stopped (04/09/23 0022)   heparin 1,700 Units/hr (04/09/23 0732)   potassium chloride     PRN Meds: acetaminophen **OR** acetaminophen, docusate sodium, hydrALAZINE, metoprolol tartrate, ondansetron **OR** ondansetron (ZOFRAN) IV, polyethylene glycol, senna-docusate   Vital Signs    Vitals:   04/09/23 0200 04/09/23 0300 04/09/23 0400 04/09/23 0500  BP: (!) 105/51 (!) 112/57 (!) 106/52 (!) 111/48  Pulse: 69 72 69 70  Resp: 20 (!) 32 (!) 22 17  Temp: 97.8 F (36.6 C)     TempSrc: Oral     SpO2: 90% (!) 86% 93% 92%  Weight:      Height:        Intake/Output Summary (Last 24 hours) at 04/09/2023 0940 Last data filed at 04/09/2023 0400 Gross  per 24 hour  Intake 1219.02 ml  Output 1050 ml  Net 169.02 ml      04/07/2023    3:11 PM 04/06/2023    9:52 AM 10/07/2021   10:45 AM  Last 3 Weights  Weight (lbs) 214 lb 11.2 oz 224 lb 224 lb  Weight (kg) 97.387 kg 101.606 kg 101.606 kg      Telemetry    Normal sinus rhythm with PACs, ST abnormality markedly improved- Personally Reviewed  ECG     - Personally Reviewed  Physical Exam   GEN: No acute distress.   Neck: No JVD Cardiac: RRR, no murmurs, rubs, or gallops.  Respiratory: Clear to auscultation bilaterally. GI: Soft, nontender, non-distended  MS: No edema; No deformity. Neuro:  Nonfocal  Psych: Normal affect   Labs    High Sensitivity Troponin:   Recent Labs  Lab 04/07/23 1515 04/07/23 1819 04/08/23 0741 04/08/23 0918  TROPONINIHS 34* 32* 15 18*     Chemistry Recent Labs  Lab 04/07/23 1515 04/08/23 0231 04/09/23 0243  NA 132* 135 139  K 3.8 3.8 3.2*  CL 95* 104 111  CO2 21* 18* 20*  GLUCOSE 238* 200* 149*  BUN 39* 35* 32*  CREATININE 1.99* 1.50* 1.06  CALCIUM 8.1* 7.4* 7.6*  PROT 6.8  --  5.2*  ALBUMIN 2.8*  --  1.9*  AST 46*  --  23  ALT 25  --  24  ALKPHOS 66  --  47  BILITOT 3.7*  --  1.8*  GFRNONAA 35* 49* >60  ANIONGAP 16* 13 8    Lipids No results for input(s): "CHOL", "TRIG", "HDL", "LABVLDL", "LDLCALC", "CHOLHDL" in the last 168 hours.  Hematology Recent Labs  Lab 04/07/23 1515 04/08/23 0231 04/08/23 1108 04/09/23 0243  WBC 24.3* 26.7*  --  24.1*  RBC 6.16* 5.17  --  4.59  HGB 16.2 13.7 13.2 12.1*  HCT 48.7 42.2  --  35.7*  MCV 79.1* 81.6  --  77.8*  MCH 26.3 26.5  --  26.4  MCHC 33.3 32.5  --  33.9  RDW 14.9 14.9  --  14.7  PLT 159 145*  --  137*   Thyroid No results for input(s): "TSH", "FREET4" in the last 168 hours.  BNP Recent Labs  Lab 04/07/23 1515 04/08/23 0741  BNP 262.1* 138.1*    DDimer No results for input(s): "DDIMER" in the last 168 hours.   Radiology    ECHOCARDIOGRAM COMPLETE  Result Date:  04/08/2023    ECHOCARDIOGRAM REPORT   Patient Name:   Timothy Juarez Date of Exam: 04/08/2023 Medical Rec #:  [id number]         Height:       66.0 in Accession #:    [id number]        Weight:       214.7 lb Date of Birth:  February 10, 1951         BSA:          2.061 m Patient Age:    72 years          BP:           120/67 mmHg Patient Gender: M                 HR:           94 bpm. Exam Location:  [facility] Procedure: 2D Echo, Cardiac Doppler and Color Doppler Indications:     Abnormal ECG  History:         Patient has prior history of Echocardiogram examinations, most                  recent 10/02/2019. CHF, CAD, Abnormal ECG, COPD; Risk                  Factors:Hypertension and Dyslipidemia. ETOH use, Sepsis, COVID                  +.  Sonographer:     Mikki Harbor Referring Phys:  [phone] CHRISTOPHER END Diagnosing Phys: Julien Nordmann MD  Sonographer Comments: Technically difficult study due to poor echo windows and patient is obese. IMPRESSIONS  1. Left ventricular ejection fraction, by estimation, is 55 to 60%. The left ventricle has normal function. The left ventricle has no regional wall motion abnormalities. There is mild left ventricular hypertrophy. Left ventricular diastolic parameters are consistent with Grade I diastolic dysfunction (impaired relaxation).  2. Right ventricular systolic function is normal. The right ventricular size is normal. There is normal pulmonary artery systolic pressure. The estimated right ventricular systolic pressure is 31.6 mmHg.  3. The mitral valve is normal in structure. Mild mitral valve regurgitation. No evidence of mitral stenosis.  4. The aortic valve has an indeterminant number of cusps. Aortic valve regurgitation is not visualized. No aortic stenosis is present.  5. The inferior vena cava is normal in size with greater than 50% respiratory variability, suggesting right atrial pressure of 3 mmHg. FINDINGS  Left Ventricle: Left ventricular ejection fraction, by estimation, is  55 to 60%. The left ventricle has normal function. The left ventricle has no regional wall motion abnormalities. The left ventricular internal cavity size was normal in size. There is  mild left ventricular hypertrophy. Left ventricular diastolic parameters are consistent with Grade I diastolic dysfunction (impaired relaxation). Right Ventricle: The right ventricular size is normal. No increase in right ventricular wall thickness. Right ventricular systolic function is normal. There is normal pulmonary artery systolic pressure. The tricuspid regurgitant velocity is 2.43 m/s, and  with an assumed right atrial pressure of 8 mmHg, the estimated right ventricular systolic pressure is 31.6 mmHg. Left Atrium: Left atrial size was normal in size. Right Atrium: Right atrial size was normal in size. Pericardium: There is no evidence of pericardial effusion. Mitral Valve: The mitral valve is normal in structure. There is mild calcification of the mitral valve leaflet(s). Mild mitral valve regurgitation. No evidence of mitral valve stenosis. MV peak gradient, 3.0 mmHg. The mean mitral valve gradient is 2.0 mmHg. Tricuspid Valve: The tricuspid valve is normal in structure. Tricuspid valve regurgitation is mild . No evidence of tricuspid stenosis. Aortic Valve: The aortic valve has an indeterminant number of cusps. Aortic valve regurgitation is not visualized. No aortic stenosis is present. Aortic valve mean gradient measures 2.0 mmHg. Aortic valve peak gradient measures 4.2 mmHg. Aortic valve area, by VTI measures 3.05 cm. Pulmonic Valve: The pulmonic valve was normal in structure. Pulmonic valve regurgitation is not visualized. No evidence of pulmonic stenosis. Aorta: The aortic root is normal in size and structure. Venous: The inferior vena cava is normal in size with greater than 50% respiratory variability, suggesting right atrial pressure of 3 mmHg. IAS/Shunts: No atrial level shunt detected by color flow Doppler.  LEFT  VENTRICLE PLAX 2D LVIDd:         3.60 cm   Diastology LVIDs:         2.80 cm   LV e' medial:    7.51 cm/s LV PW:         1.40 cm   LV E/e' medial:  7.7 LV IVS:        1.20 cm   LV e' lateral:   8.59 cm/s LVOT diam:     2.00 cm   LV E/e' lateral: 6.7 LV SV:         63 LV SV Index:   30 LVOT Area:     3.14 cm  RIGHT VENTRICLE RV Basal diam:  3.45 cm RV Mid diam:    3.00 cm RV S prime:     13.40 cm/s TAPSE (M-mode): 2.5 cm LEFT ATRIUM             Index        RIGHT ATRIUM           Index LA diam:        3.50 cm 1.70 cm/m   RA Area:     14.30 cm LA Vol (A2C):   31.8 ml 15.43 ml/m  RA Volume:   35.80 ml  17.37 ml/m LA Vol (A4C):   48.0 ml 23.29 ml/m LA Biplane Vol: 43.2 ml 20.96 ml/m  AORTIC VALVE                    PULMONIC VALVE AV Area (Vmax):  3.32 cm     PV Vmax:       1.03 m/s AV Area (Vmean):   2.55 cm     PV Peak grad:  4.2 mmHg AV Area (VTI):     3.05 cm AV Vmax:           103.00 cm/s AV Vmean:          73.600 cm/s AV VTI:            0.206 m AV Peak Grad:      4.2 mmHg AV Mean Grad:      2.0 mmHg LVOT Vmax:         109.00 cm/s LVOT Vmean:        59.800 cm/s LVOT VTI:          0.200 m LVOT/AV VTI ratio: 0.97  AORTA Ao Root diam: 3.60 cm MITRAL VALVE               TRICUSPID VALVE MV Area (PHT): 2.91 cm    TR Peak grad:   23.6 mmHg MV Area VTI:   3.16 cm    TR Vmax:        243.00 cm/s MV Peak grad:  3.0 mmHg MV Mean grad:  2.0 mmHg    SHUNTS MV Vmax:       0.87 m/s    Systemic VTI:  0.20 m MV Vmean:      63.9 cm/s   Systemic Diam: 2.00 cm MV Decel Time: 261 msec MV E velocity: 57.60 cm/s MV A velocity: 74.40 cm/s MV E/A ratio:  0.77 Julien Nordmann MD Electronically signed by Julien Nordmann MD Signature Date/Time: 04/08/2023/11:25:54 AM    Final    DG Chest Port 1 View  Result Date: 04/08/2023 CLINICAL DATA:  73 year old male with history of shortness of breath. EXAM: PORTABLE CHEST 1 VIEW COMPARISON:  Chest x-ray 04/07/2023. FINDINGS: Dense consolidation in the right upper lobe. Worsening patchy  consolidation also noted in the apical portion of the right upper lobe and in the perihilar aspect of the right mid to lower lung, partially obscuring the right heart border, suggesting developing right middle lobe involvement as well. Widespread areas of interstitial prominence and architectural distortion in the left lung, similar to prior examinations, likely reflective of areas of chronic postinfectious scarring and bronchiectasis, better demonstrated on prior chest CTs. No pleural effusions. No pneumothorax. No evidence of pulmonary edema. Heart size is normal. Mediastinal contours are distorted by patient positioning. IMPRESSION: 1. Worsening multilobar right-sided pneumonia, most severe in the right upper lobe, as above. Electronically Signed   By: Trudie Reed M.D.   On: 04/08/2023 07:01   DG Chest Portable 1 View  Result Date: 04/07/2023 CLINICAL DATA:  Shortness of breath EXAM: PORTABLE CHEST 1 VIEW COMPARISON:  Chest radiograph dated 04/26/2020, CT chest dated 04/27/2020 FINDINGS: Normal lung volumes. Right upper lung consolidation. Left upper lung linear scarring. No pleural effusion or pneumothorax. The heart size and mediastinal contours are within normal limits. No acute osseous abnormality. IMPRESSION: Right upper lung consolidation, suspicious for pneumonia in the acute clinical setting. Electronically Signed   By: Agustin Cree M.D.   On: 04/07/2023 16:51    Cardiac Studies   Echo as above  Patient Profile     Timothy Juarez is a 72 y.o. male with a hx of PAD status post bilateral AKA's, recurrent DVTs, HFpEF, hypertension, hyperlipidemia, GI bleed, GERD, and COPD, who is being seen 04/08/2023 for the evaluation of abnormal  EKG   Assessment & Plan    Acute respiratory failure with hypoxia and COVID-19: -Weaned off BiPAP, now on nasal cannula oxygen, saturations low 90s   Abnormal EKG with concern for inferior STEMI: ST elevation was incidentally noted yesterday on telemetry  monitor  Denies anginal symptoms, cardiac enzymes negative Echocardiogram with no significant pericardial effusion Again this morning with no chest pain Will defer ischemic workup in the setting of Covid and lack of symptoms -Can discontinue heparin infusion  Melena: Chart indicates a history of prior GI bleeding.   Hemoglobin stable Can discontinue heparin On PPI per medicine service  Acute kidney injury:, Creatinine improving, close to baseline, improved with hydration  History of DVT: On apixaban at home, currently on hold with melena. Will defer to medicine service when to restart   Total encounter time more than 50 minutes  Greater than 50% was spent in counseling and coordination of care with the patient   For questions or updates, please contact [facility] Please consult [email or web address] for contact info under        Signed, Julien Nordmann, MD  04/09/2023, 9:40 AM

## 2023-04-09 NOTE — Consult Note (Signed)
ANTICOAGULATION CONSULT NOTE  Pharmacy Consult for Heparin Infusion Indication:  history of DVT  No Known Allergies  Patient Measurements: Height: 5\' 6"  (167.6 cm) Weight: 97.4 kg (214 lb 11.2 oz) IBW/kg (Calculated) : 63.8 Heparin Dosing Weight: 85 kg  Vital Signs: Temp: 97 F (36.1 C) (08/21 2000) Temp Source: Oral (08/21 2000) BP: 105/57 (08/22 0100) Pulse Rate: 69 (08/22 0100)  Labs: Recent Labs    04/07/23 1515 04/07/23 1819 04/08/23 0227 04/08/23 0231 04/08/23 0741 04/08/23 0918 04/08/23 1108 04/08/23 2348  HGB 16.2  --   --  13.7  --   --  13.2  --   HCT 48.7  --   --  42.2  --   --   --   --   PLT 159  --   --  145*  --   --   --   --   APTT  --  40* 61*  --   --   --   --  95*  LABPROT  --  23.7*  --   --   --   --   --   --   INR  --  2.1*  --   --   --   --   --   --   HEPARINUNFRC  --  >1.10*  --   --   --   --   --   --   CREATININE 1.99*  --   --  1.50*  --   --   --   --   TROPONINIHS 34* 32*  --   --  15 18*  --   --     Estimated Creatinine Clearance: 48.6 mL/min (A) (by C-G formula based on SCr of 1.5 mg/dL (H)).   Medical History: Past Medical History:  Diagnosis Date   Acute embolism and thombos unsp deep vn unsp lower extremity (HCC)    Acute respiratory failure (HCC)    AKI (acute kidney injury) (HCC) 12/27/2018   Allergy    Anemia    Aneurysm of unspecified site (HCC)    ARF (acute respiratory failure) (HCC)    H/O   Bronchitis    Cellulitis and abscess of leg 09/27/2019   CHF (congestive heart failure) (HCC)    COPD (chronic obstructive pulmonary disease) (HCC)    Cough    Epistaxis    Gastrointestinal hemorrhage    GERD (gastroesophageal reflux disease)    Gout    Hematemesis 04/27/2020   High anion gap metabolic acidosis 12/27/2018   Hyperlipidemia    Hypertension    Hypokalemia    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) 09/27/2019   Insomnia    Iron deficiency anemia 04/21/2017   Muscle contracture    MUSCLE  SPASMS, muscle weakness   Peripheral vascular disease (HCC)    Pneumonia    Pressure ulcer    Severe sepsis (HCC) 12/27/2018   Toxic encephalopathy 12/27/2018    Pertinent Medications:  Apixaban 2.5mg  BID, last dose: patient believes he took it this morning(~0800 on 8/20), Called [facility] but no one was available to assist.  Assessment: 72 year old male with history of bilateral AKA, morbid obesity, GERD, BPH, neuropathy, hyperlipidemia, depression, anxiety, peripheral atherosclerosis of the extremity arteries, insomnia, who presents to the emergency department from [facility] for chief concerns of shortness of breath. Patient takes Apixaban at home for history of DVT. Currently, he is having trouble swallowing. Pharmacy has been consulted to initiate heparin infusion.  Overnight patient had an episode of melena so heparin infusion was stopped and is now being resumed  Goal of Therapy:  Heparin level 0.3-0.7 units/ml Heparin level 66-102 units/ml Monitor platelets by anticoagulation protocol: Yes  8/21 2348 aPTT 95, therapeutic x 1   Plan:  ---Continue heparin infusion at 1700 units/hr ---Recheck aPTT level in 8 hours to confirm and anti-Xa level daily while on heparin ---Will continue to dose via aPTT until both levels correlate, then will transition to ant-Xa dosing. ---Continue to monitor H&H and platelets  Otelia Sergeant, PharmD, [facility] 04/09/2023 1:58 AM

## 2023-04-09 NOTE — Progress Notes (Signed)
Progress Note   Patient: Timothy Juarez:[id number] DOB: 30-May-1951 DOA: 04/07/2023     2 DOS: the patient was seen and examined on 04/09/2023   Brief hospital course: Mr. Timothy Juarez is a 72 year old male with history of bilateral AKA, morbid obesity, GERD, BPH, neuropathy, hyperlipidemia, depression, anxiety, peripheral atherosclerosis of the extremity arteries, insomnia, who presents to the emergency department from [facility] for chief concerns of shortness of breath.  Of note, patient may have been hypoxic overnight.  Per ED documentation, patient was said to have SpO2 of 87% on room air.  EMS gave patient DuoNebs x 3, 2 g of magnesium, Solu-Medrol 125 mg IV one-time dose and patient on CPAP.  Vitals in the ED showed temperature of 97.8, respiration rate of 30, heart rate of 130, blood pressure 96/65, SpO2 97% on BiPAP.  VBG showed 7.37/CO2 of 39, O2 was pending.  Serum sodium is 132, potassium 3.8, chloride 95, bicarb 21, BUN of 39, serum creatinine 1.99, eGFR 35, nonfasting blood glucose 238, WBC 24.3, hemoglobin 16.2, platelets of 159.  High sensitive troponin was 34.  ED treatment: Sodium chloride 1 L bolus, cefepime, azithromycin.  8/21: Overnight patient had an episode of melena so heparin infusion was stopped.  Monitor signaling for ST elevation, EKG with concern of ST elevation in inferior and lateral leads, cardiology is consulted.  No chest pain and troponin which was barely positive on admission has been normalized.  They discussed cardiac catheterization initially but postponed due to current severe illness. GI was consulted after melena, hemoglobin decreased to 13.7 from 16 on admission.  Likely dilutional or admission hemoglobin might be concentrated as baseline appears to be around 10-12. GI will hold off to any scope at this time. Restarting heparin infusion as hemoglobin remained stable. Concern of MRSA pneumonia, will continue vancomycin and cefepime.  8/22:  Afebrile, saturating in low 90s on 3 L of oxygen.  Echocardiogram with normal EF and grade 1 diastolic dysfunction, no regional wall motion abnormalities.  Procalcitonin started trending at 28.28, potassium 3.2, CO2 20, AKI resolved, improving T. bili at 1.8. Some improvement in leukocytosis at 24 but all cell lines decreased.  Hemoglobin 12.1. Hemoccult card positive.  Stopping heparin infusion as ACS less likely.  Assessment and Plan: * Sepsis with acute hypoxic respiratory failure without septic shock (HCC) With multiple organ involvement, renal and pulmonary Etiology workup in progress, suspect secondary to community-acquired pneumonia/MRSA pneumonia given with wedge shaped consolidation on chest x-ray and viral infection (COVID PCR +), MRSA PCR positive Significantly elevated procalcitonin at 47.15>>28.80.  Preliminary blood culture negative in 24-hour.  Sputum cultures pending Patient did received IV fluid and broad-spectrum antibiotics per sepsis protocol. Continue cefepime and vancomycin. Change azithromycin to doxycycline 100 mg IV twice daily for atypical coverage in setting of prolonged QTc.  Acute respiratory failure with hypoxia (HCC) Secondary to community-acquired pneumonia, MRSA pneumonia Able to weaned off from BiPAP-currently on 3 L of oxygen  COVID-19 virus infection Airborne and contact precaution, symptomatic support Patient received Solu-Medrol with EMS at 125 mg IV prior to ED presentation Solu-Medrol 125 mg IV daily, 2 doses ordered for 04/08/2023. Significantly elevated CRP -Started on prednisone today  Leukocytosis Suspect secondary to community acquired pneumonia, started improving  Recurrent deep vein thrombosis (DVT) (HCC) Patient is on Eliquis 2.5 mg p.o. twice daily, however patient does not appear to be reliably swallowing medication at this time given patient is on BiPAP We will hold Eliquis 2.5 mg p.o. on admission  Heparin per pharmacy  ordered  Essential hypertension Blood pressure within goal, he was only on Lasix at home which is currently being held. Hydralazine 5 mg IV every 8 hours as needed for SBP greater than 180, 2 days ordered  AKI (acute kidney injury) (HCC) Suspect secondary to severe sepsis, improved -Encourage p.o. hydration -Monitor renal function -Avoid nephrotoxins  (HFpEF) heart failure with preserved ejection fraction (HCC) BNP mildly elevated at 138.  Echocardiogram without any significant abnormality. Strict I's and O's Monitor volume status closely as patient received IV fluid  Morbid obesity (HCC) Estimated body mass index is 34.65 kg/m as calculated from the following:   Height as of this encounter: 5\' 6"  (1.676 m).   Weight as of this encounter: 97.4 kg.   This complicates overall care and prognosis.   Hyperlipidemia -Continue home statin      Subjective: Patient appears more comfortable today.  No chest pain or shortness of breath, on 3 L of oxygen.  Physical Exam: Vitals:   04/09/23 1100 04/09/23 1200 04/09/23 1300 04/09/23 1400  BP: 117/65 118/69 118/63 116/63  Pulse: 89 75 72 82  Resp: (!) 23 (!) 26 17 (!) 23  Temp:  98.4 F (36.9 C)    TempSrc:      SpO2: 90% 93% 94% 94%  Weight:      Height:       General.  Ill-appearing, obese elderly man, in no acute distress. Pulmonary.  Few scattered rhonchi bilaterally, normal respiratory effort. CV.  Regular rate and rhythm, no JVD, rub or murmur. Abdomen.  Soft, nontender, nondistended, BS positive. CNS.  Alert and oriented .  No focal neurologic deficit. Extremities.  Bilateral AKA Psychiatry.  Judgment and insight appears normal.    Data Reviewed: Prior data reviewed  Family Communication: Talked with daughter on phone.  Disposition: Status is: Inpatient Remains inpatient appropriate because: Severity of illness.  Planned Discharge Destination: Skilled nursing facility  DVT Prophylaxis: Heparin Time spent: 45  minutes  This record has been created using Conservation officer, historic buildings. Errors have been sought and corrected,but may not always be located. Such creation errors do not reflect on the standard of care.   Author: Arnetha Courser, MD 04/09/2023 3:11 PM  For on call review [email or web address].

## 2023-04-09 NOTE — TOC Initial Note (Signed)
Transition of Care [facility]) - Initial/Assessment Note    Patient Details  Name: Timothy Juarez MRN: [id number] Date of Birth: January 26, 1951  Transition of Care [facility]) CM/SW Contact:    Kreg Shropshire, RN Phone Number: 04/09/2023, 10:08 AM  Clinical Narrative:                  Pt arrived from ED from: Resident of [facility] Caregiver Support: Support of [facility] DME at Home: Wheelchair, walker, Press photographer,  Transportation: [employer] provides transportation Previous Services: HH/SNF Preference: [employer] has PT/OT First Person of Contact: Adolphus Birchwood Daughter PCP: Dale Wyandotte, MD  Readmission prevention screening completed.   Debbie with admissions at [facility] stated if pt needs bipap at facility. They will need to know ahead of time so they can order it. Also pt is allowed to come back to [facility]. He is a long term resident.   Cm will continue to follow for toc needs and d/c planning.    Barriers to Discharge: Continued Medical Work up   Patient Goals and CMS Choice   CMS Medicare.gov Compare Post Acute Care list provided to:: Patient Choice offered to / list presented to : Patient      Expected Discharge Plan and Services     Post Acute Care Choice: Skilled Nursing Facility Living arrangements for the past 2 months: Skilled Nursing Facility                                      Prior Living Arrangements/Services Living arrangements for the past 2 months: Skilled Nursing Facility Lives with:: Facility Resident          Need for Family Participation in Patient Care: Yes (Comment) Care giver support system in place?: Yes (comment) Current home services: DME    Activities of Daily Living      Permission Sought/Granted                  Emotional Assessment   Attitude/Demeanor/Rapport: Engaged Affect (typically observed): Calm        Admission diagnosis:  Severe sepsis with acute organ dysfunction  (HCC) [A41.9, R65.20] Sepsis with acute hypoxic respiratory failure without septic shock, due to unspecified organism (HCC) [A41.9, R65.20, J96.01] Patient Active Problem List   Diagnosis Date Noted   Severe sepsis with acute organ dysfunction (HCC) 04/07/2023   Morbid obesity (HCC) 04/07/2023   S/P AKA (above knee amputation) bilateral (HCC) 04/06/2023   Carotid bruit 04/06/2023   Atherosclerosis of artery of extremity with rest pain (HCC) 07/18/2021   Ischemic leg 03/21/2021   Esophagitis    Esophageal ulcer with bleeding 04/28/2020   Chronic diastolic CHF (congestive heart failure) (HCC) 04/27/2020   Leukocytosis 04/27/2020   Alcohol use 04/27/2020   Pulmonary disease    Hypotension 09/29/2019   History of COVID-19 09/27/2019   Abscess of left AKA stump 09/27/2019   (HFpEF) heart failure with preserved ejection fraction (HCC) 09/27/2019   COPD with chronic bronchitis 09/27/2019   Chronic anticoagulation with eliquis 09/27/2019   Acute on chronic respiratory failure with hypoxia (HCC) 12/27/2018   Community acquired bilateral lower lobe pneumonia 12/27/2018   COVID-19 virus infection 12/27/2018   AKI (acute kidney injury) (HCC) 12/27/2018   Acute respiratory failure with hypoxia (HCC) 12/27/2018   Status post above-knee amputation (HCC) 11/02/2018   Symptomatic anemia 10/10/2017  Chronic deep vein thrombosis (DVT) of left lower extremity (HCC)    Iron deficiency anemia 04/21/2017   Essential hypertension 03/13/2017   Recurrent deep vein thrombosis (DVT) (HCC) 03/11/2017   Hyperlipidemia 11/04/2016   Pressure ulcer 04/11/2016   Pseudoaneurysm of left femoral artery (HCC) 04/11/2016   Atherosclerosis of native arteries of extremity with rest pain (HCC) 04/10/2016   Atherosclerosis of native arteries of extremities with gangrene, left leg (HCC) 11/01/2015   Ischemia of lower extremity 09/14/2015   Atherosclerotic peripheral vascular disease with ulceration (HCC) 01/18/2015    PCP:  Dale Mokelumne Hill, MD Pharmacy:   [facility] [place], [place] - [address] [place] [address] Phone: [phone] Fax: [phone]     Social Determinants of Health (SDOH) Social History: SDOH Screenings   Tobacco Use: Medium Risk (04/07/2023)   SDOH Interventions:     Readmission Risk Interventions    04/09/2023   10:07 AM  Readmission Risk Prevention Plan  Transportation Screening Complete  PCP or Specialist Appt within 3-5 Days Complete  HRI or Home Care Consult Complete  Social Work Consult for Recovery Care Planning/Counseling Complete  Palliative Care Screening Not Applicable  Medication Review Oceanographer) Referral to Pharmacy

## 2023-04-09 NOTE — Assessment & Plan Note (Addendum)
Secondary to community-acquired pneumonia, MRSA pneumonia Able to weaned off from BiPAP-currently on 2 L of oxygen, no baseline oxygen use -Try weaning as tolerated

## 2023-04-10 DIAGNOSIS — A419 Sepsis, unspecified organism: Secondary | ICD-10-CM | POA: Diagnosis not present

## 2023-04-10 DIAGNOSIS — J9601 Acute respiratory failure with hypoxia: Secondary | ICD-10-CM | POA: Diagnosis not present

## 2023-04-10 DIAGNOSIS — I10 Essential (primary) hypertension: Secondary | ICD-10-CM | POA: Diagnosis not present

## 2023-04-10 DIAGNOSIS — E878 Other disorders of electrolyte and fluid balance, not elsewhere classified: Secondary | ICD-10-CM | POA: Insufficient documentation

## 2023-04-10 DIAGNOSIS — U071 COVID-19: Secondary | ICD-10-CM | POA: Diagnosis not present

## 2023-04-10 LAB — BASIC METABOLIC PANEL
Anion gap: 8 (ref 5–15)
BUN: 33 mg/dL — ABNORMAL HIGH (ref 8–23)
CO2: 21 mmol/L — ABNORMAL LOW (ref 22–32)
Calcium: 7.9 mg/dL — ABNORMAL LOW (ref 8.9–10.3)
Chloride: 113 mmol/L — ABNORMAL HIGH (ref 98–111)
Creatinine, Ser: 1.02 mg/dL (ref 0.61–1.24)
GFR, Estimated: >60 mL/min (ref >60)
Glucose, Bld: 138 mg/dL — ABNORMAL HIGH (ref 70–99)
Potassium: 3.2 mmol/L — ABNORMAL LOW (ref 3.5–5.1)
Sodium: 142 mmol/L (ref 135–145)

## 2023-04-10 LAB — PHOSPHORUS: Phosphorus: 1.5 mg/dL — ABNORMAL LOW (ref 2.5–4.6)

## 2023-04-10 LAB — MAGNESIUM: Magnesium: 2.2 mg/dL (ref 1.7–2.4)

## 2023-04-10 MED ORDER — K PHOS MONO-SOD PHOS DI & MONO 155-852-130 MG PO TABS
500.0000 mg | ORAL_TABLET | ORAL | Status: DC
  Filled 2023-04-10 (×4): qty 2

## 2023-04-10 MED ORDER — POTASSIUM CHLORIDE CRYS ER 20 MEQ PO TBCR
40.0000 meq | EXTENDED_RELEASE_TABLET | Freq: Once | ORAL | Status: AC
  Administered 2023-04-10: 40 meq via ORAL

## 2023-04-10 MED ORDER — PANTOPRAZOLE SODIUM 40 MG PO TBEC
40.0000 mg | DELAYED_RELEASE_TABLET | Freq: Every day | ORAL | Status: DC
  Administered 2023-04-11 – 2023-04-12 (×2): 40 mg via ORAL
  Filled 2023-04-10 (×3): qty 1

## 2023-04-10 MED ORDER — POTASSIUM & SODIUM PHOSPHATES 280-160-250 MG PO PACK
2.0000 | PACK | ORAL | Status: AC
  Administered 2023-04-10 – 2023-04-11 (×4): 2 via ORAL
  Filled 2023-04-10: qty 2
  Filled 2023-04-10: qty 1
  Filled 2023-04-10: qty 2

## 2023-04-10 MED ORDER — DOXYCYCLINE HYCLATE 100 MG PO TABS
100.0000 mg | ORAL_TABLET | Freq: Two times a day (BID) | ORAL | Status: DC
  Administered 2023-04-10 – 2023-04-12 (×4): 100 mg via ORAL
  Filled 2023-04-10 (×4): qty 1

## 2023-04-10 MED ORDER — SODIUM CHLORIDE 0.9 % IV SOLN: INTRAVENOUS | Status: DC | PRN

## 2023-04-10 NOTE — Progress Notes (Signed)
PHARMACIST - PHYSICIAN COMMUNICATION DR: Nelson Chimes CONCERNING: Antibiotic IV to Oral Route Change Policy  RECOMMENDATION: This patient is receiving doxycycline by the intravenous route.  Based on criteria approved by the Pharmacy and Therapeutics Committee, the antibiotic(s) is/are being converted to the equivalent oral dose form(s).  DESCRIPTION: These criteria include: Patient being treated for a respiratory tract infection, urinary tract infection, cellulitis or clostridium difficile associated diarrhea if on metronidazole The patient is not neutropenic and does not exhibit a GI malabsorption state The patient is eating (either orally or via tube) and/or has been taking other orally administered medications for a least 24 hours The patient is improving clinically and has a Tmax < 100.5  If you have questions about this conversion, please contact the Pharmacy Department  []   [phone] )  [facility] [x]   [phone] )  [facility] []   [phone] )  [facility]  []   [phone] )  [facility] []   [phone] )  Ilene Qua    Littie Deeds, PharmD PGY1 Pharmacy Resident

## 2023-04-10 NOTE — Plan of Care (Signed)

## 2023-04-10 NOTE — Progress Notes (Signed)
PHARMACIST - PHYSICIAN COMMUNICATION  DR: Nelson Chimes  CONCERNING: IV to Oral Route Change Policy  RECOMMENDATION: This patient is receiving pantoprazole by the intravenous route. Based on criteria approved by the Pharmacy and Therapeutics Committee, the intravenous medication(s) is/are being converted to the equivalent oral dose form(s).  DESCRIPTION: These criteria include: The patient is eating (either orally or via tube) and/or has been taking other orally administered medications for a least 24 hours The patient has no evidence of active gastrointestinal bleeding or impaired GI absorption (gastrectomy, short bowel, patient on TNA or NPO).  If you have questions about this conversion, please contact the Pharmacy Department  []   [phone] )  [facility] [x]   [phone] )  [facility] []   [phone] )  [facility] []   [phone] )  [facility] []   [phone] )  Ilene Qua   Littie Deeds, PharmD PGY1 Pharmacy Resident 04/10/2023 2:18 PM

## 2023-04-10 NOTE — Progress Notes (Signed)
Progress Note   Patient: Timothy Juarez QQV:[id number] DOB: [birth year] DOA: 04/07/2023     3 DOS: the patient was seen and examined on 04/10/2023   Brief hospital course: Mr. Timothy Juarez is a 72 year old male with history of bilateral AKA, morbid obesity, GERD, BPH, neuropathy, hyperlipidemia, depression, anxiety, peripheral atherosclerosis of the extremity arteries, insomnia, who presents to the emergency department from [facility] for chief concerns of shortness of breath.  Of note, patient may have been hypoxic overnight.  Per ED documentation, patient was said to have SpO2 of 87% on room air.  EMS gave patient DuoNebs x 3, 2 g of magnesium, Solu-Medrol 125 mg IV one-time dose and patient on CPAP.  Vitals in the ED showed temperature of 97.8, respiration rate of 30, heart rate of 130, blood pressure 96/65, SpO2 97% on BiPAP.  VBG showed 7.37/CO2 of 39, O2 was pending.  Serum sodium is 132, potassium 3.8, chloride 95, bicarb 21, BUN of 39, serum creatinine 1.99, eGFR 35, nonfasting blood glucose 238, WBC 24.3, hemoglobin 16.2, platelets of 159.  High sensitive troponin was 34.  ED treatment: Sodium chloride 1 L bolus, cefepime, azithromycin.  8/21: Overnight patient had an episode of melena so heparin infusion was stopped.  Monitor signaling for ST elevation, EKG with concern of ST elevation in inferior and lateral leads, cardiology is consulted.  No chest pain and troponin which was barely positive on admission has been normalized.  They discussed cardiac catheterization initially but postponed due to current severe illness. GI was consulted after melena, hemoglobin decreased to 13.7 from 16 on admission.  Likely dilutional or admission hemoglobin might be concentrated as baseline appears to be around 10-12. GI will hold off to any scope at this time. Restarting heparin infusion as hemoglobin remained stable. Concern of MRSA pneumonia, will continue vancomycin and cefepime.  8/22:  Afebrile, saturating in low 90s on 3 L of oxygen.  Echocardiogram with normal EF and grade 1 diastolic dysfunction, no regional wall motion abnormalities.  Procalcitonin started trending at 28.28, potassium 3.2, CO2 20, AKI resolved, improving T. bili at 1.8. Some improvement in leukocytosis at 24 but all cell lines decreased.  Hemoglobin 12.1. Hemoccult card positive.  Stopping heparin infusion as ACS less likely.  8/23: Hemodynamically stable, currently on 2 L of oxygen, no baseline oxygen use.  Sputum cultures with Staph aureus-pending susceptibility.  Blood cultures remain negative.  Stopping cefepime and we will continue with vancomycin for now, likely be discharged tomorrow on doxycycline.  Assessment and Plan: * Sepsis with acute hypoxic respiratory failure without septic shock (HCC) With multiple organ involvement, renal and pulmonary  suspect secondary to community-acquired pneumonia/MRSA pneumonia given with wedge shaped consolidation on chest x-ray and viral infection (COVID PCR +), MRSA PCR positive. Significantly elevated procalcitonin at 47.15>>28.80.  Preliminary blood culture negative .Sputum cultures with Staph aureus-pending susceptibility Patient did received IV fluid and broad-spectrum antibiotics per sepsis protocol. -Stop cefepime and continue with vancomycin -Likely be discharged on doxycycline which can cover Staph aureus and atypical both  Acute respiratory failure with hypoxia (HCC) Secondary to community-acquired pneumonia, MRSA pneumonia Able to weaned off from BiPAP-currently on 2 L of oxygen, no baseline oxygen use -Try weaning as tolerated  COVID-19 virus infection Airborne and contact precaution, symptomatic support Patient received Solu-Medrol with EMS at 125 mg IV prior to ED presentation Solu-Medrol 125 mg IV daily, 2 doses ordered for 04/08/2023. Significantly elevated CRP -Started on prednisone today  Leukocytosis Suspect secondary to community acquired  pneumonia, started improving  Recurrent deep vein thrombosis (DVT) (HCC) Patient is on Eliquis 2.5 mg p.o. twice daily, however patient does not appear to be reliably swallowing medication at this time given patient is on BiPAP We will hold Eliquis 2.5 mg p.o. on admission Heparin per pharmacy ordered  Essential hypertension Blood pressure within goal, he was only on Lasix at home which is currently being held. Hydralazine 5 mg IV every 8 hours as needed for SBP greater than 180, 2 days ordered  AKI (acute kidney injury) (HCC) Suspect secondary to severe sepsis, improved -Encourage p.o. hydration -Monitor renal function -Avoid nephrotoxins  (HFpEF) heart failure with preserved ejection fraction (HCC) BNP mildly elevated at 138.  Echocardiogram without any significant abnormality. Strict I's and O's Monitor volume status closely as patient received IV fluid  Morbid obesity (HCC) Estimated body mass index is 34.65 kg/m as calculated from the following:   Height as of this encounter: 5\' 6"  (1.676 m).   Weight as of this encounter: 97.4 kg.   This complicates overall care and prognosis.   Electrolyte abnormality Hypokalemia and hypophosphatemia.  Magnesium normal -Replete electrolytes and monitor  Hyperlipidemia -Continue home statin      Subjective: Patient was sitting comfortably in bed when seen today.  Oriented to self only.  No new concern.  Physical Exam: Vitals:   04/09/23 1800 04/09/23 1855 04/09/23 1916 04/10/23 0114  BP: 136/61 (!) 146/58 (!) 146/86 134/74  Pulse: 73 76 81 71  Resp: (!) 21 20 16 20   Temp:  97.6 F (36.4 C) 97.8 F (36.6 C) 97.7 F (36.5 C)  TempSrc:  Oral Oral Oral  SpO2: (!) 87% (!) 86% 93% 94%  Weight:      Height:       General.  Frail elderly man, in no acute distress. Pulmonary.  Lungs clear bilaterally, normal respiratory effort. CV.  Regular rate and rhythm, no JVD, rub or murmur. Abdomen.  Soft, nontender, nondistended, BS  positive. CNS.  Alert and oriented .  No focal neurologic deficit. Extremities.  Bilateral AKA Psychiatry.  Judgment and insight appears normal.     Data Reviewed: Prior data reviewed  Family Communication:   Disposition: Status is: Inpatient Remains inpatient appropriate because: Severity of illness.  Planned Discharge Destination: Skilled nursing facility  DVT Prophylaxis: Heparin Time spent: 44 minutes  This record has been created using Conservation officer, historic buildings. Errors have been sought and corrected,but may not always be located. Such creation errors do not reflect on the standard of care.   Author: Arnetha Courser, MD 04/10/2023 3:31 PM  For on call review [email or web address].

## 2023-04-10 NOTE — Assessment & Plan Note (Signed)
Hypokalemia and hypophosphatemia.  Magnesium normal -Replete electrolytes and monitor

## 2023-04-10 NOTE — TOC Progression Note (Addendum)
Transition of Care [facility]) - Progression Note    Patient Details  Name: JGUADALUPE FINNIGAN MRN: [id number] Date of Birth: 09/15/1950  Transition of Care [facility]) CM/SW Contact  Margarito Liner, LCSW Phone Number: 04/10/2023, 10:39 AM  Clinical Narrative:   MD confirmed patient will not need bipap when he returns to SNF. Admissions coordinator is aware.  1:10 pm: Per MD, potential discharge tomorrow. [facility] admissions coordinator is aware and agreeable. Patient was not on oxygen prior to admission but admissions coordinator confirmed they can provide it if needed when he returns.  Expected Discharge Plan: Skilled Nursing Facility Barriers to Discharge: Continued Medical Work up  Expected Discharge Plan and Services     Post Acute Care Choice: Skilled Nursing Facility Living arrangements for the past 2 months: Skilled Nursing Facility                                       Social Determinants of Health (SDOH) Interventions SDOH Screenings   Tobacco Use: Medium Risk (04/07/2023)    Readmission Risk Interventions    04/09/2023   10:07 AM  Readmission Risk Prevention Plan  Transportation Screening Complete  PCP or Specialist Appt within 3-5 Days Complete  HRI or Home Care Consult Complete  Social Work Consult for Recovery Care Planning/Counseling Complete  Palliative Care Screening Not Applicable  Medication Review Oceanographer) Referral to Pharmacy

## 2023-04-10 NOTE — Consult Note (Signed)
Pharmacy Antibiotic Note  Timothy Juarez is a 72 y.o. male admitted on 04/07/2023 with  shortness of breath .  Pharmacy has been consulted for vancomycin and cefepime dosing. Patient also on doxycycline for atypical pneumonia coverage.  Renal function has stabilized and returned to baseline. Will transition to AUC dosing.  Plan: Cefepime discontinued today Continue vancomycin 1250 mg Q24H Goal AUC 400-550 Expected AUC: 469.8 Expected Css: 11.3 SCr used 1.02, IBW, Vd 0.5 Pharmacy will continue to monitor and dose adjust appropriately  Height: 5\' 6"  (167.6 cm) Weight: 97.4 kg (214 lb 11.2 oz) IBW/kg (Calculated) : 63.8  Temp (24hrs), Avg:98 F (36.7 C), Min:97.6 F (36.4 C), Max:98.5 F (36.9 C)  Recent Labs  Lab 04/07/23 1515 04/07/23 1520 04/07/23 1819 04/07/23 2113 04/08/23 0231 04/08/23 1816 04/09/23 0243 04/10/23 0935  WBC 24.3*  --   --   --  26.7*  --  24.1*  --   CREATININE 1.99*  --   --   --  1.50*  --  1.06 1.02  LATICACIDVEN  --  3.8* 3.8* 3.1*  --   --   --   --   VANCORANDOM  --   --   --   --   --  8  --   --     Estimated Creatinine Clearance: 71.5 mL/min (by C-G formula based on SCr of 1.02 mg/dL).    No Known Allergies  Antimicro/bials this admission: Cefepime 8/20 >> 8/23 Vanc 8/20 >>  Doxycycline 8/21 >>  Dose adjustments this admission: N/A  Microbiology results: 8/20 MRSA PCR: POSITIVE 8/20 BCx: NGTD 8/21 Sputum cx: NG 8/21 Resp cx: pending  Thank you for allowing pharmacy to be a part of this patient's care.  Littie Deeds, PharmD PGY1 Pharmacy Resident 04/10/2023 11:54 AM

## 2023-04-10 NOTE — Consult Note (Signed)
PHARMACY CONSULT NOTE - ELECTROLYTES  Pharmacy Consult for Electrolyte Monitoring and Replacement   Recent Labs: Height: 5\' 6"  (167.6 cm) Weight: 97.4 kg (214 lb 11.2 oz) IBW/kg (Calculated) : 63.8 Estimated Creatinine Clearance: 68.8 mL/min (by C-G formula based on SCr of 1.06 mg/dL). Potassium (mmol/L)  Date Value  04/10/2023 3.2 (L)  12/01/2013 3.9   Magnesium (mg/dL)  Date Value  57/84/6962 2.2  04/01/2013 2.2   Calcium (mg/dL)  Date Value  95/28/4132 7.9 (L)   Calcium, Total (mg/dL)  Date Value  44/08/270 9.4   Albumin (g/dL)  Date Value  53/66/4403 1.9 (L)  03/25/2013 1.2 (L)   Phosphorus (mg/dL)  Date Value  47/42/5956 1.5 (L)  03/29/2013 3.9   Sodium (mmol/L)  Date Value  04/10/2023 142  12/01/2013 136   Corrected Ca: 9.58 mg/dL  Assessment  Timothy Juarez is a 72 y.o. male presenting with shortness of breath. PMH significant for bilateral AKA, morbid obesity, GERD, BPH, neuropathy, hyperlipidemia, depression, anxiety, peripheral atherosclerosis of extremity arteries, insomnia. Pharmacy has been consulted to monitor and replace electrolytes.  Diet: Dysphagia 3 diet Pertinent medications: N/A  Goal of Therapy: Electrolytes WNL  Plan:  K 3.2 - KCl 40 mEq x 1 Phos 1.5 - K-Phos Neutral 500 mg PO x 4 Check BMP, Mg, Phos with AM labs  Thank you for allowing pharmacy to be a part of this patient's care.  Littie Deeds, PharmD PGY1 Pharmacy Resident 04/10/2023 10:36 AM

## 2023-04-10 NOTE — NC FL2 (Signed)
[place] MEDICAID FL2 LEVEL OF CARE FORM     IDENTIFICATION  Patient Name: Timothy Juarez Birthdate: [birth year] Sex: male Admission Date (Current Location): 04/07/2023  [place] and [place] Number:  Chiropodist and Address:  [facility], [address], [place], [place] [address]      Provider Number: [id number]  Attending Physician Name and Address:  Arnetha Courser, MD  Relative Name and Phone Number:       Current Level of Care: Hospital Recommended Level of Care: Skilled Nursing Facility Prior Approval Number:    Date Approved/Denied:   PASRR Number: [id number]  Discharge Plan: SNF    Current Diagnoses: Patient Active Problem List   Diagnosis Date Noted   Sepsis with acute hypoxic respiratory failure without septic shock (HCC) 04/07/2023   Morbid obesity (HCC) 04/07/2023   S/P AKA (above knee amputation) bilateral (HCC) 04/06/2023   Carotid bruit 04/06/2023   Atherosclerosis of artery of extremity with rest pain (HCC) 07/18/2021   Ischemic leg 03/21/2021   Esophagitis    Esophageal ulcer with bleeding 04/28/2020   Chronic diastolic CHF (congestive heart failure) (HCC) 04/27/2020   Leukocytosis 04/27/2020   Alcohol use 04/27/2020   Pulmonary disease    Hypotension 09/29/2019   History of COVID-19 09/27/2019   Abscess of left AKA stump 09/27/2019   (HFpEF) heart failure with preserved ejection fraction (HCC) 09/27/2019   COPD with chronic bronchitis 09/27/2019   Chronic anticoagulation with eliquis 09/27/2019   Acute on chronic respiratory failure with hypoxia (HCC) 12/27/2018   Community acquired bilateral lower lobe pneumonia 12/27/2018   COVID-19 virus infection 12/27/2018   AKI (acute kidney injury) (HCC) 12/27/2018   Acute respiratory failure with hypoxia (HCC) 12/27/2018   Status post above-knee amputation (HCC) 11/02/2018   Symptomatic anemia 10/10/2017   Chronic deep vein thrombosis (DVT) of left lower extremity  (HCC)    Iron deficiency anemia 04/21/2017   Essential hypertension 03/13/2017   Recurrent deep vein thrombosis (DVT) (HCC) 03/11/2017   Hyperlipidemia 11/04/2016   Pressure ulcer 04/11/2016   Pseudoaneurysm of left femoral artery (HCC) 04/11/2016   Atherosclerosis of native arteries of extremity with rest pain (HCC) 04/10/2016   Atherosclerosis of native arteries of extremities with gangrene, left leg (HCC) 11/01/2015   Ischemia of lower extremity 09/14/2015   Atherosclerotic peripheral vascular disease with ulceration (HCC) 01/18/2015    Orientation RESPIRATION BLADDER Height & Weight     Self, Time, Situation, Place  O2 (Nasal Cannula 3 L. Bipap has been discontinued.) Incontinent, Indwelling catheter Weight: 214 lb 11.2 oz (97.4 kg) Height:  5\' 6"  (167.6 cm)  BEHAVIORAL SYMPTOMS/MOOD NEUROLOGICAL BOWEL NUTRITION STATUS   (None)  (None) Continent Diet (DYS 3)  AMBULATORY STATUS COMMUNICATION OF NEEDS Skin     Verbally Other (Comment), PU Stage and Appropriate Care (Erythema/redness.) PU Stage 1 Dressing:  (Medial sacrum: Foam every 3 days.)                     Personal Care Assistance Level of Assistance              Functional Limitations Info  Sight, Hearing, Speech Sight Info: Adequate Hearing Info: Adequate Speech Info: Adequate    SPECIAL CARE FACTORS FREQUENCY                       Contractures Contractures Info: Not present    Additional Factors Info  Code Status, Allergies, Isolation Precautions Code Status Info:  Full code Allergies Info: NKDA     Isolation Precautions Info: Airborne/Contact: COVID, MRSA.     Current Medications (04/10/2023):  This is the current hospital active medication list Current Facility-Administered Medications  Medication Dose Route Frequency Provider Last Rate Last Admin   acetaminophen (TYLENOL) tablet 650 mg  650 mg Oral Q6H PRN Cox, Amy N, DO   650 mg at 04/09/23 2126   Or   acetaminophen (TYLENOL)  suppository 650 mg  650 mg Rectal Q6H PRN Cox, Amy N, DO       apixaban (ELIQUIS) tablet 5 mg  5 mg Oral BID Arnetha Courser, MD   5 mg at 04/09/23 2125   aspirin EC tablet 81 mg  81 mg Oral Daily Arnetha Courser, MD   81 mg at 04/09/23 0907   atorvastatin (LIPITOR) tablet 10 mg  10 mg Oral Daily Arnetha Courser, MD   10 mg at 04/09/23 0907   brimonidine (ALPHAGAN) 0.15 % ophthalmic solution 1 drop  1 drop Right Eye TID Lowella Bandy, RPH   1 drop at 04/09/23 2138   ceFEPIme (MAXIPIME) 2 g in sodium chloride 0.9 % 100 mL IVPB  2 g Intravenous Q12H Mila Merry A, RPH 200 mL/hr at 04/09/23 2024 2 g at 04/09/23 2024   docusate sodium (COLACE) capsule 100 mg  100 mg Oral Daily PRN Arnetha Courser, MD       doxycycline (VIBRAMYCIN) 100 mg in sodium chloride 0.9 % 250 mL IVPB  100 mg Intravenous Q12H Cox, Amy N, DO 125 mL/hr at 04/09/23 2316 100 mg at 04/09/23 2316   DULoxetine (CYMBALTA) DR capsule 40 mg  40 mg Oral Daily Arnetha Courser, MD   40 mg at 04/09/23 0908   ferrous sulfate tablet 324 mg  324 mg Oral BID WC Arnetha Courser, MD   324 mg at 04/09/23 1637   fluticasone furoate-vilanterol (BREO ELLIPTA) 100-25 MCG/ACT 1 puff  1 puff Inhalation Daily Arnetha Courser, MD   1 puff at 04/09/23 0800   And   umeclidinium bromide (INCRUSE ELLIPTA) 62.5 MCG/ACT 1 puff  1 puff Inhalation Daily Arnetha Courser, MD   1 puff at 04/09/23 0800   gabapentin (NEURONTIN) capsule 100 mg  100 mg Oral Q12H Rockwell Alexandria, RPH   100 mg at 04/09/23 2125   ketorolac (ACULAR) 0.5 % ophthalmic solution 1 drop  1 drop Right Eye BID Lowella Bandy, RPH   1 drop at 04/09/23 2137   metoprolol tartrate (LOPRESSOR) injection 5 mg  5 mg Intravenous Q2H PRN Cox, Amy N, DO       ondansetron (ZOFRAN) tablet 4 mg  4 mg Oral Q6H PRN Cox, Amy N, DO       Or   ondansetron (ZOFRAN) injection 4 mg  4 mg Intravenous Q6H PRN Cox, Amy N, DO       pantoprazole (PROTONIX) injection 40 mg  40 mg Intravenous Q12H Jaynie Collins, DO   40 mg at  04/09/23 2136   polyethylene glycol (MIRALAX / GLYCOLAX) packet 17 g  17 g Oral BID PRN Cox, Amy N, DO       predniSONE (DELTASONE) tablet 50 mg  50 mg Oral Q breakfast Arnetha Courser, MD   50 mg at 04/09/23 8119   senna-docusate (Senokot-S) tablet 1 tablet  1 tablet Oral QHS PRN Cox, Amy N, DO   1 tablet at 04/09/23 0906   tamsulosin (FLOMAX) capsule 0.4 mg  0.4 mg Oral Daily Arnetha Courser, MD  0.4 mg at 04/09/23 0907   timolol (TIMOPTIC) 0.5 % ophthalmic solution 1 drop  1 drop Right Eye Daily Lowella Bandy, RPH   1 drop at 04/09/23 1610   vancomycin (VANCOREADY) IVPB 1250 mg/250 mL  1,250 mg Intravenous Q24H Mila Merry A, RPH 166.7 mL/hr at 04/09/23 2139 1,250 mg at 04/09/23 2139     Discharge Medications: Please see discharge summary for a list of discharge medications.  Relevant Imaging Results:  Relevant Lab Results:   Additional Information SS#: [id number]  Margarito Liner, LCSW

## 2023-04-10 NOTE — Assessment & Plan Note (Signed)
 Hydralazine 5 mg IV every 8 hours as needed for SBP greater than 180, 2 days ordered

## 2023-04-11 DIAGNOSIS — U071 COVID-19: Secondary | ICD-10-CM | POA: Diagnosis not present

## 2023-04-11 DIAGNOSIS — A419 Sepsis, unspecified organism: Secondary | ICD-10-CM | POA: Diagnosis not present

## 2023-04-11 DIAGNOSIS — I10 Essential (primary) hypertension: Secondary | ICD-10-CM | POA: Diagnosis not present

## 2023-04-11 DIAGNOSIS — J9601 Acute respiratory failure with hypoxia: Secondary | ICD-10-CM | POA: Diagnosis not present

## 2023-04-11 LAB — CBC
HCT: 38.7 % — ABNORMAL LOW (ref 39.0–52.0)
Hemoglobin: 13 g/dL (ref 13.0–17.0)
MCH: 26.3 pg (ref 26.0–34.0)
MCHC: 33.6 g/dL (ref 30.0–36.0)
MCV: 78.3 fL — ABNORMAL LOW (ref 80.0–100.0)
Platelets: 236 10*3/uL (ref 150–400)
RBC: 4.94 MIL/uL (ref 4.22–5.81)
RDW: 14.9 % (ref 11.5–15.5)
WBC: 29.8 10*3/uL — ABNORMAL HIGH (ref 4.0–10.5)
nRBC: 0.1 % (ref 0.0–0.2)

## 2023-04-11 LAB — BASIC METABOLIC PANEL
Anion gap: 8 (ref 5–15)
BUN: 29 mg/dL — ABNORMAL HIGH (ref 8–23)
CO2: 21 mmol/L — ABNORMAL LOW (ref 22–32)
Calcium: 7.9 mg/dL — ABNORMAL LOW (ref 8.9–10.3)
Chloride: 115 mmol/L — ABNORMAL HIGH (ref 98–111)
Creatinine, Ser: 0.9 mg/dL (ref 0.61–1.24)
GFR, Estimated: >60 mL/min (ref >60)
Glucose, Bld: 140 mg/dL — ABNORMAL HIGH (ref 70–99)
Potassium: 3.1 mmol/L — ABNORMAL LOW (ref 3.5–5.1)
Sodium: 144 mmol/L (ref 135–145)

## 2023-04-11 LAB — CULTURE, RESPIRATORY W GRAM STAIN

## 2023-04-11 LAB — C-REACTIVE PROTEIN: CRP: 6.5 mg/dL — ABNORMAL HIGH (ref <1.0)

## 2023-04-11 LAB — PHOSPHORUS: Phosphorus: 1.8 mg/dL — ABNORMAL LOW (ref 2.5–4.6)

## 2023-04-11 MED ORDER — FUROSEMIDE 10 MG/ML IJ SOLN
INTRAMUSCULAR | Status: AC
  Filled 2023-04-11: qty 4

## 2023-04-11 MED ORDER — POTASSIUM & SODIUM PHOSPHATES 280-160-250 MG PO PACK
2.0000 | PACK | ORAL | Status: AC
  Administered 2023-04-11 (×2): 2 via ORAL
  Filled 2023-04-11 (×3): qty 2

## 2023-04-11 MED ORDER — SODIUM CHLORIDE 0.9 % IV SOLN
2.0000 g | Freq: Three times a day (TID) | INTRAVENOUS | Status: DC
  Filled 2023-04-11: qty 12.5

## 2023-04-11 MED ORDER — POTASSIUM CHLORIDE CRYS ER 20 MEQ PO TBCR
40.0000 meq | EXTENDED_RELEASE_TABLET | ORAL | Status: AC
  Administered 2023-04-11: 40 meq via ORAL
  Filled 2023-04-11: qty 2

## 2023-04-11 MED ORDER — SODIUM CHLORIDE 0.9 % IV SOLN
2.0000 g | INTRAVENOUS | Status: DC
  Administered 2023-04-11 – 2023-04-13 (×3): 2 g via INTRAVENOUS
  Filled 2023-04-11 (×3): qty 20

## 2023-04-11 MED ORDER — FUROSEMIDE 10 MG/ML IJ SOLN
40.0000 mg | Freq: Once | INTRAMUSCULAR | Status: AC
  Administered 2023-04-11: 40 mg via INTRAVENOUS

## 2023-04-11 MED ORDER — FUROSEMIDE 40 MG PO TABS
40.0000 mg | ORAL_TABLET | Freq: Every day | ORAL | Status: DC
  Administered 2023-04-12 – 2023-04-13 (×2): 40 mg via ORAL
  Filled 2023-04-11 (×2): qty 1

## 2023-04-11 NOTE — Plan of Care (Signed)
  Problem: Fluid Volume: Goal: Hemodynamic stability will improve 04/11/2023 1411 by Ermalene Searing, RN Outcome: Progressing 04/11/2023 1411 by Ermalene Searing, RN Outcome: Progressing   Problem: Clinical Measurements: Goal: Diagnostic test results will improve 04/11/2023 1411 by Ermalene Searing, RN Outcome: Progressing 04/11/2023 1411 by Ermalene Searing, RN Outcome: Progressing Goal: Signs and symptoms of infection will decrease 04/11/2023 1411 by Ermalene Searing, RN Outcome: Progressing 04/11/2023 1411 by Ermalene Searing, RN Outcome: Progressing   Problem: Respiratory: Goal: Ability to maintain adequate ventilation will improve 04/11/2023 1411 by Ermalene Searing, RN Outcome: Progressing 04/11/2023 1411 by Ermalene Searing, RN Outcome: Progressing   Problem: Education: Goal: Knowledge of General Education information will improve Description: Including pain rating scale, medication(s)/side effects and non-pharmacologic comfort measures 04/11/2023 1411 by Ermalene Searing, RN Outcome: Progressing 04/11/2023 1411 by Ermalene Searing, RN Outcome: Progressing   Problem: Health Behavior/Discharge Planning: Goal: Ability to manage health-related needs will improve 04/11/2023 1411 by Ermalene Searing, RN Outcome: Progressing 04/11/2023 1411 by Ermalene Searing, RN Outcome: Progressing   Problem: Clinical Measurements: Goal: Ability to maintain clinical measurements within normal limits will improve 04/11/2023 1411 by Ermalene Searing, RN Outcome: Progressing 04/11/2023 1411 by Ermalene Searing, RN Outcome: Progressing Goal: Will remain free from infection 04/11/2023 1411 by Ermalene Searing, RN Outcome: Progressing 04/11/2023 1411 by Ermalene Searing, RN Outcome: Progressing Goal: Diagnostic test results will improve 04/11/2023 1411 by Ermalene Searing, RN Outcome: Progressing 04/11/2023 1411 by Ermalene Searing, RN Outcome: Progressing Goal: Respiratory complications will improve 04/11/2023 1411 by Ermalene Searing, RN Outcome:  Progressing 04/11/2023 1411 by Ermalene Searing, RN Outcome: Progressing Goal: Cardiovascular complication will be avoided 04/11/2023 1411 by Ermalene Searing, RN Outcome: Progressing 04/11/2023 1411 by Ermalene Searing, RN Outcome: Progressing   Problem: Activity: Goal: Risk for activity intolerance will decrease 04/11/2023 1411 by Ermalene Searing, RN Outcome: Progressing 04/11/2023 1411 by Ermalene Searing, RN Outcome: Progressing   Problem: Nutrition: Goal: Adequate nutrition will be maintained 04/11/2023 1411 by Ermalene Searing, RN Outcome: Progressing 04/11/2023 1411 by Ermalene Searing, RN Outcome: Progressing   Problem: Coping: Goal: Level of anxiety will decrease 04/11/2023 1411 by Ermalene Searing, RN Outcome: Progressing 04/11/2023 1411 by Ermalene Searing, RN Outcome: Progressing   Problem: Elimination: Goal: Will not experience complications related to bowel motility 04/11/2023 1411 by Ermalene Searing, RN Outcome: Progressing 04/11/2023 1411 by Ermalene Searing, RN Outcome: Progressing Goal: Will not experience complications related to urinary retention 04/11/2023 1411 by Ermalene Searing, RN Outcome: Progressing 04/11/2023 1411 by Ermalene Searing, RN Outcome: Progressing   Problem: Pain Managment: Goal: General experience of comfort will improve 04/11/2023 1411 by Ermalene Searing, RN Outcome: Progressing 04/11/2023 1411 by Ermalene Searing, RN Outcome: Progressing   Problem: Safety: Goal: Ability to remain free from injury will improve 04/11/2023 1411 by Ermalene Searing, RN Outcome: Progressing 04/11/2023 1411 by Ermalene Searing, RN Outcome: Progressing   Problem: Skin Integrity: Goal: Risk for impaired skin integrity will decrease 04/11/2023 1411 by Ermalene Searing, RN Outcome: Progressing 04/11/2023 1411 by Ermalene Searing, RN Outcome: Progressing

## 2023-04-11 NOTE — Progress Notes (Signed)
Progress Note   Patient: Timothy Juarez UEA:[id number] DOB: [birth year] DOA: 04/07/2023     4 DOS: the patient was seen and examined on 04/11/2023   Brief hospital course: [name] is a 72 year old male with history of bilateral AKA, morbid obesity, GERD, BPH, neuropathy, hyperlipidemia, depression, anxiety, peripheral atherosclerosis of the extremity arteries, insomnia, who presents to the emergency department from [facility] for chief concerns of shortness of breath.  Of note, patient may have been hypoxic overnight.  Per ED documentation, patient was said to have SpO2 of 87% on room air.  EMS gave patient DuoNebs x 3, 2 g of magnesium, Solu-Medrol 125 mg IV one-time dose and patient on CPAP.  Vitals in the ED showed temperature of 97.8, respiration rate of 30, heart rate of 130, blood pressure 96/65, SpO2 97% on BiPAP.  VBG showed 7.37/CO2 of 39, O2 was pending.  Serum sodium is 132, potassium 3.8, chloride 95, bicarb 21, BUN of 39, serum creatinine 1.99, eGFR 35, nonfasting blood glucose 238, WBC 24.3, hemoglobin 16.2, platelets of 159.  High sensitive troponin was 34.  ED treatment: Sodium chloride 1 L bolus, cefepime, azithromycin.  8/21: Overnight patient had an episode of melena so heparin infusion was stopped.  Monitor signaling for ST elevation, EKG with concern of ST elevation in inferior and lateral leads, cardiology is consulted.  No chest pain and troponin which was barely positive on admission has been normalized.  They discussed cardiac catheterization initially but postponed due to current severe illness. GI was consulted after melena, hemoglobin decreased to 13.7 from 16 on admission.  Likely dilutional or admission hemoglobin might be concentrated as baseline appears to be around 10-12. GI will hold off to any scope at this time. Restarting heparin infusion as hemoglobin remained stable. Concern of MRSA pneumonia, will continue vancomycin and cefepime.  8/22:  Afebrile, saturating in low 90s on 3 L of oxygen.  Echocardiogram with normal EF and grade 1 diastolic dysfunction, no regional wall motion abnormalities.  Procalcitonin started trending at 28.28, potassium 3.2, CO2 20, AKI resolved, improving T. bili at 1.8. Some improvement in leukocytosis at 24 but all cell lines decreased.  Hemoglobin 12.1. Hemoccult card positive.  Stopping heparin infusion as ACS less likely.  8/23: Hemodynamically stable, currently on 2 L of oxygen, no baseline oxygen use.  Sputum cultures with Staph aureus-pending susceptibility.  Blood cultures remain negative.  Stopping cefepime and we will continue with vancomycin for now, likely be discharged tomorrow on doxycycline.  8/24: Afebrile but remained on 2 L of oxygen, worsening leukocytosis after discontinuing cefepime yesterday, starting ceftriaxone for gram-negative coverage, sputum with MRSA. More crackles noted today-giving 1 dose of IV Lasix.  Assessment and Plan: * Sepsis with acute hypoxic respiratory failure without septic shock (HCC) With multiple organ involvement, renal and pulmonary  suspect secondary to community-acquired pneumonia/MRSA pneumonia given with wedge shaped consolidation on chest x-ray and viral infection (COVID PCR +), MRSA PCR positive. Significantly elevated procalcitonin at 47.15>>28.80.  Preliminary blood culture negative .Sputum cultures with MRSA, worsening leukocytosis again after stopping cefepime. -Add ceftriaxone -Continue with vancomycin -  Acute respiratory failure with hypoxia (HCC) Secondary to community-acquired pneumonia, MRSA pneumonia Able to weaned off from BiPAP-currently on 2 L of oxygen, no baseline oxygen use -Try weaning as tolerated  COVID-19 virus infection Airborne and contact precaution, symptomatic support Patient received Solu-Medrol with EMS at 125 mg IV prior to ED presentation Solu-Medrol 125 mg IV daily, 2 doses ordered for 04/08/2023. Significantly  elevated  CRP -Started on prednisone today  Leukocytosis Suspect secondary to community acquired pneumonia, started worsening again after stopping cefepime yesterday. -Continue to monitor  Recurrent deep vein thrombosis (DVT) (HCC) -Continue home Eliquis  Essential hypertension Blood pressure within goal, he was only on Lasix at home which is currently being held. Hydralazine 5 mg IV every 8 hours as needed for SBP greater than 180, 2 days ordered  AKI (acute kidney injury) (HCC) Suspect secondary to severe sepsis, improved -Encourage p.o. hydration -Monitor renal function -Avoid nephrotoxins  (HFpEF) heart failure with preserved ejection fraction (HCC) BNP mildly elevated at 138.  Echocardiogram without any significant abnormality. Strict I's and O's -Ordered 1 dose of IV Lasix today -Restart home Lasix from tomorrow  Morbid obesity (HCC) Estimated body mass index is 34.65 kg/m as calculated from the following:   Height as of this encounter: 5\' 6"  (1.676 m).   Weight as of this encounter: 97.4 kg.   This complicates overall care and prognosis.   Electrolyte abnormality Hypokalemia and hypophosphatemia.  Magnesium normal -Replete electrolytes and monitor  Hyperlipidemia -Continue home statin      Subjective: Patient was lying down comfortably when seen today.  Remained on 2 L of oxygen.  Cough seems improving  Physical Exam: Vitals:   04/10/23 1800 04/10/23 1914 04/11/23 0313 04/11/23 0832  BP: (!) 153/76 (!) 146/71 (!) 157/81 (!) 156/75  Pulse: 74 68 69 73  Resp: 20 20 20 18   Temp: 97.8 F (36.6 C) 97.8 F (36.6 C) (!) 97.4 F (36.3 C) 98 F (36.7 C)  TempSrc: Oral Oral Oral Oral  SpO2: 97% 96% 95% 92%  Weight:      Height:       General.  Ill-appearing gentleman, in no acute distress. Pulmonary.  Lungs clear bilaterally, normal respiratory effort. CV.  Regular rate and rhythm, no JVD, rub or murmur. Abdomen.  Soft, nontender, nondistended, BS positive. CNS.   Alert and oriented .  No focal neurologic deficit. Extremities.  No edema, no cyanosis, pulses intact and symmetrical.    Data Reviewed: Prior data reviewed  Family Communication: Talked with daughter on phone  Disposition: Status is: Inpatient Remains inpatient appropriate because: Severity of illness.  Planned Discharge Destination: Skilled nursing facility  DVT Prophylaxis: Heparin Time spent: 40 minutes  This record has been created using Conservation officer, historic buildings. Errors have been sought and corrected,but may not always be located. Such creation errors do not reflect on the standard of care.   Author: Arnetha Courser, MD 04/11/2023 1:13 PM  For on call review [email or web address].

## 2023-04-11 NOTE — Consult Note (Signed)
PHARMACY CONSULT NOTE - ELECTROLYTES  Pharmacy Consult for Electrolyte Monitoring and Replacement   Recent Labs: Height: 5\' 6"  (167.6 cm) Weight: 97.4 kg (214 lb 11.2 oz) IBW/kg (Calculated) : 63.8 Estimated Creatinine Clearance: 81 mL/min (by C-G formula based on SCr of 0.9 mg/dL). Potassium (mmol/L)  Date Value  04/11/2023 3.1 (L)  12/01/2013 3.9   Magnesium (mg/dL)  Date Value  46/96/2952 2.2  04/01/2013 2.2   Calcium (mg/dL)  Date Value  84/13/2440 7.9 (L)   Calcium, Total (mg/dL)  Date Value  06/14/2535 9.4   Albumin (g/dL)  Date Value  64/40/3474 1.9 (L)  03/25/2013 1.2 (L)   Phosphorus (mg/dL)  Date Value  25/95/6387 1.8 (L)  03/29/2013 3.9   Sodium (mmol/L)  Date Value  04/11/2023 144  12/01/2013 136   Corrected Ca: 9.58 mg/dL  Assessment  Timothy Juarez is a 72 y.o. male presenting with shortness of breath. PMH significant for bilateral AKA, morbid obesity, GERD, BPH, neuropathy, hyperlipidemia, depression, anxiety, peripheral atherosclerosis of extremity arteries, insomnia. Pharmacy has been consulted to monitor and replace electrolytes. -Covid+  Diet: Dysphagia 3 diet Pertinent medications: N/A  Goal of Therapy: Electrolytes WNL  Plan:  K 3.1 - will order KCl 40 mEq x 2   (3rd day of low K) Phos 1.8 - will order Phos-NaK 2 packets PO x 3 Check BMP, Mg, Phos with AM labs  Thank you for allowing pharmacy to be a part of this patient's care.  Bari Mantis PharmD Clinical Pharmacist 04/11/2023

## 2023-04-11 NOTE — Consult Note (Addendum)
Pharmacy Antibiotic Note  Timothy Juarez is a 72 y.o. male admitted on 04/07/2023 with  shortness of breath .  Pharmacy has been consulted for vancomycin and cefepime dosing. Patient also on doxycycline for atypical pneumonia coverage.  Renal function has stabilized and returned to baseline. Will transition to AUC dosing.  Plan: Cefepime discontinued 8/23.Per provider will start ceftriaxone today due to worsening WBC. Continue vancomycin 1250 mg Q24H Goal AUC 400-550 Expected AUC: 469.8 Expected Css: 11.3 SCr used 1.02, IBW, Vd 0.5 Pharmacy will continue to monitor and dose adjust appropriately  Height: 5\' 6"  (167.6 cm) Weight: 97.4 kg (214 lb 11.2 oz) IBW/kg (Calculated) : 63.8  Temp (24hrs), Avg:97.8 F (36.6 C), Min:97.4 F (36.3 C), Max:98 F (36.7 C)  Recent Labs  Lab 04/07/23 1515 04/07/23 1520 04/07/23 1819 04/07/23 2113 04/08/23 0231 04/08/23 1816 04/09/23 0243 04/10/23 0935 04/11/23 0548  WBC 24.3*  --   --   --  26.7*  --  24.1*  --  29.8*  CREATININE 1.99*  --   --   --  1.50*  --  1.06 1.02 0.90  LATICACIDVEN  --  3.8* 3.8* 3.1*  --   --   --   --   --   VANCORANDOM  --   --   --   --   --  8  --   --   --     Estimated Creatinine Clearance: 81 mL/min (by C-G formula based on SCr of 0.9 mg/dL).    No Known Allergies  Antimicro/bials this admission: Ceftriaxone 8/24>> Cefepime 8/20 >> 8/23 Vanc 8/20 >>  Doxycycline 8/21 >>  Dose adjustments this admission: N/A  Microbiology results: 8/20 MRSA PCR: POSITIVE 8/20 BCx: NGTD 8/21 Sputum cx: NG 8/21 Resp cx: pending  Thank you for allowing pharmacy to be a part of this patient's care.  Gardner Candle, PharmD, BCPS Clinical Pharmacist 04/11/2023 9:20 AM

## 2023-04-11 NOTE — Assessment & Plan Note (Signed)
Continue home Eliquis. 

## 2023-04-11 NOTE — Plan of Care (Signed)
  Problem: Health Behavior/Discharge Planning: Goal: Ability to manage health-related needs will improve Outcome: Progressing   Problem: Activity: Goal: Risk for activity intolerance will decrease Outcome: Progressing   Problem: Nutrition: Goal: Adequate nutrition will be maintained Outcome: Progressing   Problem: Coping: Goal: Level of anxiety will decrease Outcome: Progressing   

## 2023-04-12 DIAGNOSIS — I10 Essential (primary) hypertension: Secondary | ICD-10-CM | POA: Diagnosis not present

## 2023-04-12 DIAGNOSIS — A419 Sepsis, unspecified organism: Secondary | ICD-10-CM | POA: Diagnosis not present

## 2023-04-12 DIAGNOSIS — J9601 Acute respiratory failure with hypoxia: Secondary | ICD-10-CM | POA: Diagnosis not present

## 2023-04-12 DIAGNOSIS — U071 COVID-19: Secondary | ICD-10-CM | POA: Diagnosis not present

## 2023-04-12 LAB — CULTURE, BLOOD (ROUTINE X 2)
Culture: NO GROWTH
Culture: NO GROWTH
Special Requests: ADEQUATE
Special Requests: ADEQUATE

## 2023-04-12 LAB — PHOSPHORUS: Phosphorus: 2.2 mg/dL — ABNORMAL LOW (ref 2.5–4.6)

## 2023-04-12 LAB — BASIC METABOLIC PANEL
Anion gap: 6 (ref 5–15)
BUN: 28 mg/dL — ABNORMAL HIGH (ref 8–23)
CO2: 23 mmol/L (ref 22–32)
Calcium: 7.5 mg/dL — ABNORMAL LOW (ref 8.9–10.3)
Chloride: 111 mmol/L (ref 98–111)
Creatinine, Ser: 0.84 mg/dL (ref 0.61–1.24)
GFR, Estimated: >60 mL/min (ref >60)
Glucose, Bld: 120 mg/dL — ABNORMAL HIGH (ref 70–99)
Potassium: 3 mmol/L — ABNORMAL LOW (ref 3.5–5.1)
Sodium: 140 mmol/L (ref 135–145)

## 2023-04-12 LAB — CBC
HCT: 38.4 % — ABNORMAL LOW (ref 39.0–52.0)
Hemoglobin: 12.8 g/dL — ABNORMAL LOW (ref 13.0–17.0)
MCH: 26.2 pg (ref 26.0–34.0)
MCHC: 33.3 g/dL (ref 30.0–36.0)
MCV: 78.7 fL — ABNORMAL LOW (ref 80.0–100.0)
Platelets: 246 10*3/uL (ref 150–400)
RBC: 4.88 MIL/uL (ref 4.22–5.81)
RDW: 14.9 % (ref 11.5–15.5)
WBC: 30.6 10*3/uL — ABNORMAL HIGH (ref 4.0–10.5)
nRBC: 0.3 % — ABNORMAL HIGH (ref 0.0–0.2)

## 2023-04-12 LAB — MAGNESIUM: Magnesium: 2.2 mg/dL (ref 1.7–2.4)

## 2023-04-12 LAB — PROCALCITONIN: Procalcitonin: 4.91 ng/mL

## 2023-04-12 MED ORDER — POTASSIUM & SODIUM PHOSPHATES 280-160-250 MG PO PACK
2.0000 | PACK | ORAL | Status: AC
  Administered 2023-04-12 (×2): 2 via ORAL
  Filled 2023-04-12 (×2): qty 2

## 2023-04-12 MED ORDER — CHLORHEXIDINE GLUCONATE CLOTH 2 % EX PADS
6.0000 | MEDICATED_PAD | Freq: Every day | CUTANEOUS | Status: DC
  Administered 2023-04-12: 6 via TOPICAL

## 2023-04-12 MED ORDER — POTASSIUM CHLORIDE CRYS ER 20 MEQ PO TBCR
40.0000 meq | EXTENDED_RELEASE_TABLET | ORAL | Status: AC
  Administered 2023-04-12 (×2): 40 meq via ORAL
  Filled 2023-04-12 (×2): qty 2

## 2023-04-12 MED ORDER — VANCOMYCIN HCL 750 MG/150ML IV SOLN
750.0000 mg | Freq: Two times a day (BID) | INTRAVENOUS | Status: DC
  Administered 2023-04-12 – 2023-04-13 (×2): 750 mg via INTRAVENOUS
  Filled 2023-04-12 (×3): qty 150

## 2023-04-12 NOTE — Consult Note (Signed)
Pharmacy Antibiotic Note  Timothy Juarez is a 72 y.o. male admitted on 04/07/2023 with  shortness of breath .  Pharmacy has been consulted for vancomycin dosing. Patient also on doxycycline for atypical pneumonia coverage.  Renal function has stabilized and returned to baseline. Will transition to AUC dosing. -also on ceftriaxone, doxycycline   (8/24 MD note: worsening leukocytosis after discontinuing cefepime yesterday, starting ceftriaxone for gram-negative coverage)  -sputum cx: MRSA+  Plan: Scr improved 1.02>>0.90>>0.84 Will adjust vancomycin from1250 mg Q24H to 750mg  Q12H  for MRSA PNA Goal AUC 400-550 Expected AUC: 481 Expected Css: 14.2 SCr used 0.84, IBW, Vd 0.5  (BMI 34.6) Pharmacy will continue to monitor and dose adjust appropriately   Height: 5\' 6"  (167.6 cm) Weight: 97.4 kg (214 lb 11.2 oz) IBW/kg (Calculated) : 63.8  Temp (24hrs), Avg:97.9 F (36.6 C), Min:97.2 F (36.2 C), Max:98.5 F (36.9 C)  Recent Labs  Lab 04/07/23 1515 04/07/23 1520 04/07/23 1819 04/07/23 2113 04/08/23 0231 04/08/23 1816 04/09/23 0243 04/10/23 0935 04/11/23 0548 04/12/23 0455  WBC 24.3*  --   --   --  26.7*  --  24.1*  --  29.8* 30.6*  CREATININE 1.99*  --   --   --  1.50*  --  1.06 1.02 0.90 0.84  LATICACIDVEN  --  3.8* 3.8* 3.1*  --   --   --   --   --   --   VANCORANDOM  --   --   --   --   --  8  --   --   --   --     Estimated Creatinine Clearance: 86.8 mL/min (by C-G formula based on SCr of 0.84 mg/dL).    No Known Allergies  Antimicro/bials this admission: Ceftriaxone 8/24>> Cefepime 8/20 >> 8/23 Vanc 8/20 >>  Doxycycline 8/21 >>  Dose adjustments this admission: 8/25  vanc 1250 q24h to 750 q12h  Microbiology results: 8/20 MRSA PCR: POSITIVE 8/20 BCx: NGTD 8/21 Sputum cx: NG 8/21 Resp cx: pending  Thank you for allowing pharmacy to be a part of this patient's care.  Angelique Blonder, PharmD Clinical Pharmacist 04/12/2023 8:57 AM

## 2023-04-12 NOTE — Plan of Care (Signed)

## 2023-04-12 NOTE — Consult Note (Signed)
PHARMACY CONSULT NOTE - ELECTROLYTES  Pharmacy Consult for Electrolyte Monitoring and Replacement   Recent Labs: Height: 5\' 6"  (167.6 cm) Weight: 97.4 kg (214 lb 11.2 oz) IBW/kg (Calculated) : 63.8 Estimated Creatinine Clearance: 86.8 mL/min (by C-G formula based on SCr of 0.84 mg/dL). Potassium (mmol/L)  Date Value  04/12/2023 3.0 (L)  12/01/2013 3.9   Magnesium (mg/dL)  Date Value  09/81/1914 2.2  04/01/2013 2.2   Calcium (mg/dL)  Date Value  78/29/5621 7.5 (L)   Calcium, Total (mg/dL)  Date Value  30/86/5784 9.4   Albumin (g/dL)  Date Value  69/62/9528 1.9 (L)  03/25/2013 1.2 (L)   Phosphorus (mg/dL)  Date Value  41/32/4401 2.2 (L)  03/29/2013 3.9   Sodium (mmol/L)  Date Value  04/12/2023 140  12/01/2013 136   Corrected Ca: 9.58 mg/dL  Assessment  Timothy Juarez is a 72 y.o. male presenting with shortness of breath. PMH significant for bilateral AKA, morbid obesity, GERD, BPH, neuropathy, hyperlipidemia, depression, anxiety, peripheral atherosclerosis of extremity arteries, insomnia. Pharmacy has been consulted to monitor and replace electrolytes. -Covid+  Diet: Dysphagia 3 diet Pertinent medications: N/A  Goal of Therapy: Electrolytes WNL  Plan:  On furosemide 40 mg po daily (received furosemide 40mg  IV x 1 8/24@ 1021) --K 3.0 - will order KCl 40 mEq q4h x 2  (4th day of low K)- only received 1 dose of KCL on 8/24 (2 were ordered) per charting on MAR --Phos 2.2 - will order Phos-NaK 2 packets PO x 2 Check BMP, Mg, Phos with AM labs  Thank you for allowing pharmacy to be a part of this patient's care.  Bari Mantis PharmD Clinical Pharmacist 04/12/2023

## 2023-04-12 NOTE — Progress Notes (Signed)
Progress Note   Patient: Timothy Juarez DXI:[id number] DOB: 12-22-1950 DOA: 04/07/2023     5 DOS: the patient was seen and examined on 04/12/2023   Brief hospital course: Mr. Timothy Juarez is a 72 year old male with history of bilateral AKA, morbid obesity, GERD, BPH, neuropathy, hyperlipidemia, depression, anxiety, peripheral atherosclerosis of the extremity arteries, insomnia, who presents to the emergency department from [facility] for chief concerns of shortness of breath.  Of note, patient may have been hypoxic overnight.  Per ED documentation, patient was said to have SpO2 of 87% on room air.  EMS gave patient DuoNebs x 3, 2 g of magnesium, Solu-Medrol 125 mg IV one-time dose and patient on CPAP.  Vitals in the ED showed temperature of 97.8, respiration rate of 30, heart rate of 130, blood pressure 96/65, SpO2 97% on BiPAP.  VBG showed 7.37/CO2 of 39, O2 was pending.  Serum sodium is 132, potassium 3.8, chloride 95, bicarb 21, BUN of 39, serum creatinine 1.99, eGFR 35, nonfasting blood glucose 238, WBC 24.3, hemoglobin 16.2, platelets of 159.  High sensitive troponin was 34.  ED treatment: Sodium chloride 1 L bolus, cefepime, azithromycin.  8/21: Overnight patient had an episode of melena so heparin infusion was stopped.  Monitor signaling for ST elevation, EKG with concern of ST elevation in inferior and lateral leads, cardiology is consulted.  No chest pain and troponin which was barely positive on admission has been normalized.  They discussed cardiac catheterization initially but postponed due to current severe illness. GI was consulted after melena, hemoglobin decreased to 13.7 from 16 on admission.  Likely dilutional or admission hemoglobin might be concentrated as baseline appears to be around 10-12. GI will hold off to any scope at this time. Restarting heparin infusion as hemoglobin remained stable. Concern of MRSA pneumonia, will continue vancomycin and cefepime.  8/22:  Afebrile, saturating in low 90s on 3 L of oxygen.  Echocardiogram with normal EF and grade 1 diastolic dysfunction, no regional wall motion abnormalities.  Procalcitonin started trending at 28.28, potassium 3.2, CO2 20, AKI resolved, improving T. bili at 1.8. Some improvement in leukocytosis at 24 but all cell lines decreased.  Hemoglobin 12.1. Hemoccult card positive.  Stopping heparin infusion as ACS less likely.  8/23: Hemodynamically stable, currently on 2 L of oxygen, no baseline oxygen use.  Sputum cultures with Staph aureus-pending susceptibility.  Blood cultures remain negative.  Stopping cefepime and we will continue with vancomycin for now, likely be discharged tomorrow on doxycycline.  8/24: Afebrile but remained on 2 L of oxygen, worsening leukocytosis after discontinuing cefepime yesterday, starting ceftriaxone for gram-negative coverage, sputum with MRSA. More crackles noted today-giving 1 dose of IV Lasix.  8/25: Remained afebrile, improving procalcitonin and CRP with slight worsening of leukocytosis.  Stopping prednisone.  Hypophosphatemia and hypokalemia which is being repleted.  Assessment and Plan: * Sepsis with acute hypoxic respiratory failure without septic shock (HCC) With multiple organ involvement, renal and pulmonary  suspect secondary to community-acquired pneumonia/MRSA pneumonia given with wedge shaped consolidation on chest x-ray and viral infection (COVID PCR +), MRSA PCR positive. Significantly elevated procalcitonin at 47.15>>28.80>>4.91.  Blood cultures remain negative.Sputum cultures with MRSA, worsening leukocytosis again , we added ceftriaxone yesterday, clinically seems improving so most likely some effect with concurrent steroid use which has been discontinued today -Continue ceftriaxone -Continue with vancomycin -  Acute respiratory failure with hypoxia (HCC) Secondary to community-acquired pneumonia, MRSA pneumonia Able to weaned off from BiPAP-currently  on 2 L of  oxygen, no baseline oxygen use -Try weaning as tolerated  COVID-19 virus infection Airborne and contact precaution, symptomatic support Patient received Solu-Medrol with EMS at 125 mg IV prior to ED presentation Solu-Medrol 125 mg IV daily, 2 doses ordered for 04/08/2023. Significantly elevated CRP -Started on prednisone today  Leukocytosis Suspect secondary to community acquired pneumonia, started worsening again after stopping cefepime yesterday. -Continue to monitor  Recurrent deep vein thrombosis (DVT) (HCC) -Continue home Eliquis  Essential hypertension Blood pressure within goal, he was only on Lasix at home which is currently being held. Hydralazine 5 mg IV every 8 hours as needed for SBP greater than 180, 2 days ordered  AKI (acute kidney injury) (HCC) Suspect secondary to severe sepsis, improved -Encourage p.o. hydration -Monitor renal function -Avoid nephrotoxins  (HFpEF) heart failure with preserved ejection fraction (HCC) BNP mildly elevated at 138.  Echocardiogram without any significant abnormality. Strict I's and O's -Ordered 1 dose of IV Lasix today -Restart home Lasix from tomorrow  Morbid obesity (HCC) Estimated body mass index is 34.65 kg/m as calculated from the following:   Height as of this encounter: 5\' 6"  (1.676 m).   Weight as of this encounter: 97.4 kg.   This complicates overall care and prognosis.   Electrolyte abnormality Hypokalemia and hypophosphatemia.  Magnesium normal -Replete electrolytes and monitor  Hyperlipidemia -Continue home statin      Subjective: Patient was seen and examined today.  No new concern.  Remained on 2 L of oxygen.  Physical Exam: Vitals:   04/11/23 0832 04/11/23 1738 04/11/23 1925 04/12/23 0322  BP: (!) 156/75 (!) 152/84 139/74 (!) 152/68  Pulse: 73 63 75 70  Resp: 18 16 20 20   Temp: 98 F (36.7 C) (!) 97.2 F (36.2 C) 98.5 F (36.9 C) 98 F (36.7 C)  TempSrc: Oral  Oral Oral  SpO2: 92%  94% 97% 95%  Weight:      Height:       General.  Well-developed elderly man, in no acute distress. Pulmonary.  Lungs clear bilaterally, normal respiratory effort. CV.  Regular rate and rhythm, no JVD, rub or murmur. Abdomen.  Soft, nontender, nondistended, BS positive. CNS.  Alert and oriented .  No focal neurologic deficit. Extremities.  Bilateral AKA Psychiatry.  Judgment and insight appears normal.   He  Data Reviewed: Prior data reviewed  Family Communication: Talked with daughter on phone  Disposition: Status is: Inpatient Remains inpatient appropriate because: Severity of illness.  Planned Discharge Destination: Skilled nursing facility  DVT Prophylaxis: Heparin Time spent: 40 minutes  This record has been created using Conservation officer, historic buildings. Errors have been sought and corrected,but may not always be located. Such creation errors do not reflect on the standard of care.   Author: Arnetha Courser, MD 04/12/2023 2:16 PM  For on call review [email or web address].

## 2023-04-13 DIAGNOSIS — A4102 Sepsis due to Methicillin resistant Staphylococcus aureus: Secondary | ICD-10-CM | POA: Diagnosis not present

## 2023-04-13 DIAGNOSIS — A419 Sepsis, unspecified organism: Secondary | ICD-10-CM | POA: Diagnosis not present

## 2023-04-13 DIAGNOSIS — U071 COVID-19: Secondary | ICD-10-CM | POA: Diagnosis not present

## 2023-04-13 DIAGNOSIS — J9601 Acute respiratory failure with hypoxia: Secondary | ICD-10-CM | POA: Diagnosis not present

## 2023-04-13 DIAGNOSIS — I82409 Acute embolism and thrombosis of unspecified deep veins of unspecified lower extremity: Secondary | ICD-10-CM | POA: Diagnosis not present

## 2023-04-13 LAB — CBC
HCT: 40.1 % (ref 39.0–52.0)
Hemoglobin: 13.3 g/dL (ref 13.0–17.0)
MCH: 26.2 pg (ref 26.0–34.0)
MCHC: 33.2 g/dL (ref 30.0–36.0)
MCV: 78.9 fL — ABNORMAL LOW (ref 80.0–100.0)
Platelets: 287 10*3/uL (ref 150–400)
RBC: 5.08 MIL/uL (ref 4.22–5.81)
RDW: 15 % (ref 11.5–15.5)
WBC: 31.3 10*3/uL — ABNORMAL HIGH (ref 4.0–10.5)
nRBC: 0.2 % (ref 0.0–0.2)

## 2023-04-13 LAB — CBC WITH DIFFERENTIAL/PLATELET
Abs Immature Granulocytes: 6.39 10*3/uL — ABNORMAL HIGH (ref 0.00–0.07)
Basophils Absolute: 0.1 10*3/uL (ref 0.0–0.1)
Basophils Relative: 0 %
Eosinophils Absolute: 0.2 10*3/uL (ref 0.0–0.5)
Eosinophils Relative: 1 %
HCT: 40.7 % (ref 39.0–52.0)
Hemoglobin: 13.3 g/dL (ref 13.0–17.0)
Immature Granulocytes: 21 %
Lymphocytes Relative: 9 %
Lymphs Abs: 2.8 10*3/uL (ref 0.7–4.0)
MCH: 26.2 pg (ref 26.0–34.0)
MCHC: 32.7 g/dL (ref 30.0–36.0)
MCV: 80.1 fL (ref 80.0–100.0)
Monocytes Absolute: 1 10*3/uL (ref 0.1–1.0)
Monocytes Relative: 3 %
Neutro Abs: 19.8 10*3/uL — ABNORMAL HIGH (ref 1.7–7.7)
Neutrophils Relative %: 66 %
Platelets: 283 10*3/uL (ref 150–400)
RBC: 5.08 MIL/uL (ref 4.22–5.81)
RDW: 15.3 % (ref 11.5–15.5)
Smear Review: NORMAL
WBC: 30.2 10*3/uL — ABNORMAL HIGH (ref 4.0–10.5)
nRBC: 0.3 % — ABNORMAL HIGH (ref 0.0–0.2)

## 2023-04-13 LAB — BASIC METABOLIC PANEL
Anion gap: 8 (ref 5–15)
BUN: 21 mg/dL (ref 8–23)
CO2: 24 mmol/L (ref 22–32)
Calcium: 7.5 mg/dL — ABNORMAL LOW (ref 8.9–10.3)
Chloride: 108 mmol/L (ref 98–111)
Creatinine, Ser: 0.73 mg/dL (ref 0.61–1.24)
GFR, Estimated: >60 mL/min (ref >60)
Glucose, Bld: 105 mg/dL — ABNORMAL HIGH (ref 70–99)
Potassium: 3.2 mmol/L — ABNORMAL LOW (ref 3.5–5.1)
Sodium: 140 mmol/L (ref 135–145)

## 2023-04-13 LAB — TECHNOLOGIST SMEAR REVIEW: Plt Morphology: NORMAL

## 2023-04-13 LAB — PHOSPHORUS: Phosphorus: 2.9 mg/dL (ref 2.5–4.6)

## 2023-04-13 LAB — MAGNESIUM: Magnesium: 2 mg/dL (ref 1.7–2.4)

## 2023-04-13 MED ORDER — DOXYCYCLINE HYCLATE 100 MG PO TABS: 100.0000 mg | ORAL_TABLET | Freq: Two times a day (BID) | ORAL | 0 refills | Status: AC

## 2023-04-13 MED ORDER — POTASSIUM CHLORIDE CRYS ER 20 MEQ PO TBCR: 20.0000 meq | EXTENDED_RELEASE_TABLET | Freq: Two times a day (BID) | ORAL | Status: AC

## 2023-04-13 MED ORDER — FERROUS SULFATE 325 (65 FE) MG PO TABS: 325.0000 mg | ORAL_TABLET | Freq: Two times a day (BID) | ORAL | Status: DC

## 2023-04-13 MED ORDER — POTASSIUM CHLORIDE CRYS ER 20 MEQ PO TBCR
40.0000 meq | EXTENDED_RELEASE_TABLET | Freq: Three times a day (TID) | ORAL | Status: DC
  Administered 2023-04-13: 40 meq via ORAL
  Filled 2023-04-13: qty 2

## 2023-04-13 NOTE — Consult Note (Signed)
PHARMACY CONSULT NOTE - ELECTROLYTES  Pharmacy Consult for Electrolyte Monitoring and Replacement   Recent Labs: Height: 5\' 6"  (167.6 cm) Weight: 98.6 kg (217 lb 6 oz) IBW/kg (Calculated) : 63.8 Estimated Creatinine Clearance: 91.7 mL/min (by C-G formula based on SCr of 0.73 mg/dL). Potassium (mmol/L)  Date Value  04/13/2023 3.2 (L)  12/01/2013 3.9   Magnesium (mg/dL)  Date Value  16/05/9603 2.0  04/01/2013 2.2   Calcium (mg/dL)  Date Value  54/04/8118 7.5 (L)   Calcium, Total (mg/dL)  Date Value  14/78/2956 9.4   Albumin (g/dL)  Date Value  21/30/8657 1.9 (L)  03/25/2013 1.2 (L)   Phosphorus (mg/dL)  Date Value  84/69/6295 2.9  03/29/2013 3.9   Sodium (mmol/L)  Date Value  04/13/2023 140  12/01/2013 136   Corrected Ca: 9.58 mg/dL  Assessment  Timothy Juarez is a 72 y.o. male presenting with shortness of breath. PMH significant for bilateral AKA, morbid obesity, GERD, BPH, neuropathy, hyperlipidemia, depression, anxiety, peripheral atherosclerosis of extremity arteries, insomnia. Pharmacy has been consulted to monitor and replace electrolytes.  Diet: Dysphagia 3 diet Pertinent medications: furosemide 40 mg PO daily  Goal of Therapy: Electrolytes WNL  Plan:  Give KCl 40 mEq PO x3 as patient remains hypokalemic Mg stable, daily monitoring no longer warranted  Check BMP with AM labs  Thank you for allowing pharmacy to be a part of this patient's care.  Celene Squibb, PharmD Clinical Pharmacist 04/13/2023 7:41 AM

## 2023-04-13 NOTE — Discharge Summary (Signed)
Physician Discharge Summary   Patient: Timothy Juarez MRN: [id number] DOB: 1950-12-29  Admit date:     04/07/2023  Discharge date: 04/13/23  Discharge Physician: Arnetha Courser   PCP: Dale Canjilon, MD   Recommendations at discharge:  Please obtain CBC and BMP within next 3 to 4 days If persistent or worsening leukocytosis, patient need to see an hematologist as outpatient. Please complete 3 more days of doxycycline Patient is also being discharged on 2 L of oxygen, please try weaning as tolerated Follow-up with primary care provider  Discharge Diagnoses: Principal Problem:   Sepsis with acute hypoxic respiratory failure without septic shock (HCC) Active Problems:   Acute respiratory failure with hypoxia (HCC)   COVID-19 virus infection   Recurrent deep vein thrombosis (DVT) (HCC)   Leukocytosis   Essential hypertension   AKI (acute kidney injury) (HCC)   (HFpEF) heart failure with preserved ejection fraction (HCC)   Morbid obesity (HCC)   Hyperlipidemia   History of COVID-19   Chronic anticoagulation with eliquis   Alcohol use   Esophageal ulcer with bleeding   Atherosclerosis of artery of extremity with rest pain (HCC)   S/P AKA (above knee amputation) bilateral (HCC)   Electrolyte abnormality   Hospital Course: [name] is a 72 year old male with history of bilateral AKA, morbid obesity, GERD, BPH, neuropathy, hyperlipidemia, depression, anxiety, peripheral atherosclerosis of the extremity arteries, insomnia, who presents to the emergency department from [facility] for chief concerns of shortness of breath.  Of note, patient may have been hypoxic overnight.  Per ED documentation, patient was said to have SpO2 of 87% on room air.  EMS gave patient DuoNebs x 3, 2 g of magnesium, Solu-Medrol 125 mg IV one-time dose and patient on CPAP.  Vitals in the ED showed temperature of 97.8, respiration rate of 30, heart rate of 130, blood pressure 96/65, SpO2 97% on  BiPAP.  VBG showed 7.37/CO2 of 39, O2 was pending.  Serum sodium is 132, potassium 3.8, chloride 95, bicarb 21, BUN of 39, serum creatinine 1.99, eGFR 35, nonfasting blood glucose 238, WBC 24.3, hemoglobin 16.2, platelets of 159.  High sensitive troponin was 34.  ED treatment: Sodium chloride 1 L bolus, cefepime, azithromycin.  8/21: Overnight patient had an episode of melena so heparin infusion was stopped.  Monitor signaling for ST elevation, EKG with concern of ST elevation in inferior and lateral leads, cardiology is consulted.  No chest pain and troponin which was barely positive on admission has been normalized.  They discussed cardiac catheterization initially but postponed due to current severe illness. GI was consulted after melena, hemoglobin decreased to 13.7 from 16 on admission.  Likely dilutional or admission hemoglobin might be concentrated as baseline appears to be around 10-12. GI will hold off to any scope at this time. Restarting heparin infusion as hemoglobin remained stable. Concern of MRSA pneumonia, will continue vancomycin and cefepime.  8/22: Afebrile, saturating in low 90s on 3 L of oxygen.  Echocardiogram with normal EF and grade 1 diastolic dysfunction, no regional wall motion abnormalities.  Procalcitonin started trending at 28.28, potassium 3.2, CO2 20, AKI resolved, improving T. bili at 1.8. Some improvement in leukocytosis at 24 but all cell lines decreased.  Hemoglobin 12.1. Hemoccult card positive.  Stopping heparin infusion as ACS less likely.  8/23: Hemodynamically stable, currently on 2 L of oxygen, no baseline oxygen use.  Sputum cultures with Staph aureus-pending susceptibility.  Blood cultures remain negative.  Stopping cefepime and we will  continue with vancomycin for now, likely be discharged tomorrow on doxycycline.  8/24: Afebrile but remained on 2 L of oxygen, worsening leukocytosis after discontinuing cefepime yesterday, starting ceftriaxone for  gram-negative coverage, sputum with MRSA. More crackles noted today-giving 1 dose of IV Lasix.  8/25: Remained afebrile, improving procalcitonin and CRP with slight worsening of leukocytosis.  Stopping prednisone.  Hypophosphatemia and hypokalemia which is being repleted.  8/26: Hemodynamically stable and remained afebrile.  Slight worsening leukocytosis but all other infectious parameters has been improving.  Persistent mild hypokalemia, home potassium supplement dose was increased to twice daily.  Patient will need repeat CBC in next few days and if remained elevated then he will need a outpatient hematology evaluation.  Likely secondary to steroid use at this time and they were discontinued.  Patient completed a 7-day course of antibiotics.  He has been given 3 more days of doxycycline on discharge.  Patient never required any pain medications while in the hospital so they were discontinued.  Patient will continue on current medications and need to have a close follow-up with his providers for further recommendations.  Assessment and Plan: * Sepsis with acute hypoxic respiratory failure without septic shock (HCC) With multiple organ involvement, renal and pulmonary  suspect secondary to community-acquired pneumonia/MRSA pneumonia given with wedge shaped consolidation on chest x-ray and viral infection (COVID PCR +), MRSA PCR positive. Significantly elevated procalcitonin at 47.15>>28.80>>4.91.  Blood cultures remain negative.Sputum cultures with MRSA, worsening leukocytosis again , we added ceftriaxone yesterday, clinically seems improving so most likely some effect with concurrent steroid use which has been discontinued today -Completed a 7-day course of antibiotic today. -Continue with doxycycline for 3 more days  Acute respiratory failure with hypoxia (HCC) Secondary to community-acquired pneumonia, MRSA pneumonia Able to weaned off from BiPAP-currently on 2 L of oxygen, no baseline  oxygen use -Try weaning as tolerated  COVID-19 virus infection Airborne and contact precaution, symptomatic support Patient received Solu-Medrol with EMS at 125 mg IV prior to ED presentation Solu-Medrol 125 mg IV daily, 2 doses ordered for 04/08/2023. Significantly elevated CRP Steroids for discontinued later due to worsening leukocytosis.  Leukocytosis Slight worsening of leukocytosis despite other infectious parameters are improving, likely secondary to steroid use. Prednisone will be discontinued If patient continued to have worsening leukocytosis will need outpatient hematology evaluation.  Recurrent deep vein thrombosis (DVT) (HCC) -Continue home Eliquis  Essential hypertension Blood pressure within goal, he was only on Lasix at home which is currently being held. Hydralazine 5 mg IV every 8 hours as needed for SBP greater than 180, 2 days ordered  AKI (acute kidney injury) (HCC) Suspect secondary to severe sepsis, improved -Encourage p.o. hydration -Monitor renal function -Avoid nephrotoxins  (HFpEF) heart failure with preserved ejection fraction (HCC) BNP mildly elevated at 138.  Echocardiogram without any significant abnormality. Strict I's and O's -Ordered 1 dose of IV Lasix today -Restart home Lasix from tomorrow  Morbid obesity (HCC) Estimated body mass index is 34.65 kg/m as calculated from the following:   Height as of this encounter: 5\' 6"  (1.676 m).   Weight as of this encounter: 97.4 kg.   This complicates overall care and prognosis.   Electrolyte abnormality Hypokalemia and hypophosphatemia.  Magnesium normal -Replete electrolytes and monitor  Hyperlipidemia -Continue home statin      Consultants: PCCM Procedures performed: None Disposition: Skilled nursing facility Diet recommendation:  Discharge Diet Orders (From admission, onward)     Start     Ordered   04/13/23  0000  Diet - low sodium heart healthy        04/13/23 1102            Cardiac diet DISCHARGE MEDICATION: Allergies as of 04/13/2023   No Known Allergies      Medication List     STOP taking these medications    fentaNYL 25 MCG/HR Commonly known as: DURAGESIC   Oxycodone HCl 10 MG Tabs       TAKE these medications    acetaminophen 325 MG tablet Commonly known as: TYLENOL Take 650 mg by mouth every 4 (four) hours as needed for fever.   Alphagan P 0.1 % Soln Generic drug: brimonidine Place 1 drop into the right eye 3 (three) times daily.   apixaban 2.5 MG Tabs tablet Commonly known as: Eliquis Take 1 tablet (2.5 mg total) by mouth 2 (two) times daily.   ascorbic acid 500 MG tablet Commonly known as: VITAMIN C Take 500 mg by mouth 2 (two) times daily.   aspirin EC 81 MG tablet Take 1 tablet (81 mg total) by mouth daily. Swallow whole.   atorvastatin 10 MG tablet Commonly known as: Lipitor Take 1 tablet (10 mg total) by mouth daily. What changed: how much to take   baclofen 10 MG tablet Commonly known as: LIORESAL Take 10 mg by mouth 3 (three) times daily. Hold for sedation   docusate sodium 100 MG capsule Commonly known as: COLACE Take 100 mg by mouth daily as needed for mild constipation.   doxycycline 100 MG tablet Commonly known as: VIBRA-TABS Take 1 tablet (100 mg total) by mouth every 12 (twelve) hours for 3 days.   DULoxetine 20 MG capsule Commonly known as: CYMBALTA Take 40 mg by mouth daily.   ferrous sulfate 324 (65 Fe) MG Tbec Take 324 mg by mouth 2 (two) times daily.   Fish Oil 1000 MG Caps Take 1,000 mg by mouth daily.   Fluticasone-Umeclidin-Vilant 100-62.5-25 MCG/ACT Aepb Inhale 1 puff into the lungs every morning.   furosemide 40 MG tablet Commonly known as: LASIX Take 40 mg by mouth daily.   gabapentin 100 MG capsule Commonly known as: NEURONTIN Take 100 mg by mouth every 12 (twelve) hours.   GoodSense Artificial Tears 0.5-0.6 % Soln Generic drug: Polyvinyl Alcohol-Povidone Place into the  right eye 4 (four) times daily.   guaiFENesin 100 MG/5ML Soln Commonly known as: ROBITUSSIN Take 15 mLs by mouth 3 (three) times daily as needed for cough (congestion).   ketorolac 0.4 % Soln Commonly known as: ACULAR Place 1 drop into the right eye 2 (two) times daily.   loperamide 2 MG capsule Commonly known as: IMODIUM Take 2 mg by mouth 3 (three) times daily as needed for diarrhea or loose stools.   nitroGLYCERIN 0.4 MG SL tablet Commonly known as: NITROSTAT Place 0.14 mg under the tongue every 5 (five) minutes as needed for chest pain.   oxymetazoline 0.05 % nasal spray Commonly known as: AFRIN Place 2 sprays into both nostrils 2 (two) times daily as needed (For nose bleeds).   potassium chloride SA 20 MEQ tablet Commonly known as: KLOR-CON M Take 1 tablet (20 mEq total) by mouth 2 (two) times daily. What changed: when to take this   sodium chloride 0.65 % Soln nasal spray Commonly known as: OCEAN Place 1 spray into both nostrils 2 (two) times daily as needed for congestion.   spiritus frumenti Soln Commonly known as: ethyl alcohol 3 oz of bourbon by mouth at bed  time as needed for sleep, pleasure.   tamsulosin 0.4 MG Caps capsule Commonly known as: FLOMAX Take 1 capsule (0.4 mg total) by mouth daily.   timolol 0.5 % ophthalmic solution Commonly known as: TIMOPTIC Place 1 drop into the right eye daily.   Vitamin D3 50 MCG (2000 UT) Tabs Take 4,000 Int'l Units by mouth daily.               Discharge Care Instructions  (From admission, onward)           Start     Ordered   04/13/23 0000  No dressing needed        04/13/23 1102            Contact information for follow-up providers     Dale Tennille, MD. Schedule an appointment as soon as possible for a visit in 1 week(s).   Contact information: [address] [place] [address] [phone]              Contact information for after-discharge care     Destination      [facility] [place] .   Service: Skilled Nursing Contact information: [address] [place] [address] [phone]                    Discharge Exam: Ceasar Mons Weights   04/07/23 1511 04/13/23 0418  Weight: 97.4 kg 98.6 kg   General.  Obese gentleman, in no acute distress. Pulmonary.  Lungs clear bilaterally, normal respiratory effort. CV.  Regular rate and rhythm, no JVD, rub or murmur. Abdomen.  Soft, nontender, nondistended, BS positive. CNS.  Alert and oriented .  No focal neurologic deficit. Extremities.  Bilateral AKA  Condition at discharge: stable  The results of significant diagnostics from this hospitalization (including imaging, microbiology, ancillary and laboratory) are listed below for reference.   Imaging Studies: ECHOCARDIOGRAM COMPLETE  Result Date: 04/08/2023    ECHOCARDIOGRAM REPORT   Patient Name:   DONTAI TILLEY Date of Exam: 04/08/2023 Medical Rec #:  [id number]         Height:       66.0 in Accession #:    [id number]        Weight:       214.7 lb Date of Birth:  21-Apr-1951         BSA:          2.061 m Patient Age:    72 years          BP:           120/67 mmHg Patient Gender: M                 HR:           94 bpm. Exam Location:  [facility] Procedure: 2D Echo, Cardiac Doppler and Color Doppler Indications:     Abnormal ECG  History:         Patient has prior history of Echocardiogram examinations, most                  recent 10/02/2019. CHF, CAD, Abnormal ECG, COPD; Risk                  Factors:Hypertension and Dyslipidemia. ETOH use, Sepsis, COVID                  +.  Sonographer:     Mikki Harbor Referring Phys:  [phone] CHRISTOPHER END Diagnosing Phys: Marcial Pacas  Gollan MD  Sonographer Comments: Technically difficult study due to poor echo windows and patient is obese. IMPRESSIONS  1. Left ventricular ejection fraction, by estimation, is 55 to 60%. The left ventricle has normal function. The left ventricle has no regional wall motion  abnormalities. There is mild left ventricular hypertrophy. Left ventricular diastolic parameters are consistent with Grade I diastolic dysfunction (impaired relaxation).  2. Right ventricular systolic function is normal. The right ventricular size is normal. There is normal pulmonary artery systolic pressure. The estimated right ventricular systolic pressure is 31.6 mmHg.  3. The mitral valve is normal in structure. Mild mitral valve regurgitation. No evidence of mitral stenosis.  4. The aortic valve has an indeterminant number of cusps. Aortic valve regurgitation is not visualized. No aortic stenosis is present.  5. The inferior vena cava is normal in size with greater than 50% respiratory variability, suggesting right atrial pressure of 3 mmHg. FINDINGS  Left Ventricle: Left ventricular ejection fraction, by estimation, is 55 to 60%. The left ventricle has normal function. The left ventricle has no regional wall motion abnormalities. The left ventricular internal cavity size was normal in size. There is  mild left ventricular hypertrophy. Left ventricular diastolic parameters are consistent with Grade I diastolic dysfunction (impaired relaxation). Right Ventricle: The right ventricular size is normal. No increase in right ventricular wall thickness. Right ventricular systolic function is normal. There is normal pulmonary artery systolic pressure. The tricuspid regurgitant velocity is 2.43 m/s, and  with an assumed right atrial pressure of 8 mmHg, the estimated right ventricular systolic pressure is 31.6 mmHg. Left Atrium: Left atrial size was normal in size. Right Atrium: Right atrial size was normal in size. Pericardium: There is no evidence of pericardial effusion. Mitral Valve: The mitral valve is normal in structure. There is mild calcification of the mitral valve leaflet(s). Mild mitral valve regurgitation. No evidence of mitral valve stenosis. MV peak gradient, 3.0 mmHg. The mean mitral valve gradient is 2.0  mmHg. Tricuspid Valve: The tricuspid valve is normal in structure. Tricuspid valve regurgitation is mild . No evidence of tricuspid stenosis. Aortic Valve: The aortic valve has an indeterminant number of cusps. Aortic valve regurgitation is not visualized. No aortic stenosis is present. Aortic valve mean gradient measures 2.0 mmHg. Aortic valve peak gradient measures 4.2 mmHg. Aortic valve area, by VTI measures 3.05 cm. Pulmonic Valve: The pulmonic valve was normal in structure. Pulmonic valve regurgitation is not visualized. No evidence of pulmonic stenosis. Aorta: The aortic root is normal in size and structure. Venous: The inferior vena cava is normal in size with greater than 50% respiratory variability, suggesting right atrial pressure of 3 mmHg. IAS/Shunts: No atrial level shunt detected by color flow Doppler.  LEFT VENTRICLE PLAX 2D LVIDd:         3.60 cm   Diastology LVIDs:         2.80 cm   LV e' medial:    7.51 cm/s LV PW:         1.40 cm   LV E/e' medial:  7.7 LV IVS:        1.20 cm   LV e' lateral:   8.59 cm/s LVOT diam:     2.00 cm   LV E/e' lateral: 6.7 LV SV:         63 LV SV Index:   30 LVOT Area:     3.14 cm  RIGHT VENTRICLE RV Basal diam:  3.45 cm RV Mid diam:    3.00  cm RV S prime:     13.40 cm/s TAPSE (M-mode): 2.5 cm LEFT ATRIUM             Index        RIGHT ATRIUM           Index LA diam:        3.50 cm 1.70 cm/m   RA Area:     14.30 cm LA Vol (A2C):   31.8 ml 15.43 ml/m  RA Volume:   35.80 ml  17.37 ml/m LA Vol (A4C):   48.0 ml 23.29 ml/m LA Biplane Vol: 43.2 ml 20.96 ml/m  AORTIC VALVE                    PULMONIC VALVE AV Area (Vmax):    3.32 cm     PV Vmax:       1.03 m/s AV Area (Vmean):   2.55 cm     PV Peak grad:  4.2 mmHg AV Area (VTI):     3.05 cm AV Vmax:           103.00 cm/s AV Vmean:          73.600 cm/s AV VTI:            0.206 m AV Peak Grad:      4.2 mmHg AV Mean Grad:      2.0 mmHg LVOT Vmax:         109.00 cm/s LVOT Vmean:        59.800 cm/s LVOT VTI:          0.200  m LVOT/AV VTI ratio: 0.97  AORTA Ao Root diam: 3.60 cm MITRAL VALVE               TRICUSPID VALVE MV Area (PHT): 2.91 cm    TR Peak grad:   23.6 mmHg MV Area VTI:   3.16 cm    TR Vmax:        243.00 cm/s MV Peak grad:  3.0 mmHg MV Mean grad:  2.0 mmHg    SHUNTS MV Vmax:       0.87 m/s    Systemic VTI:  0.20 m MV Vmean:      63.9 cm/s   Systemic Diam: 2.00 cm MV Decel Time: 261 msec MV E velocity: 57.60 cm/s MV A velocity: 74.40 cm/s MV E/A ratio:  0.77 Julien Nordmann MD Electronically signed by Julien Nordmann MD Signature Date/Time: 04/08/2023/11:25:54 AM    Final    DG Chest Port 1 View  Result Date: 04/08/2023 CLINICAL DATA:  72 year old male with history of shortness of breath. EXAM: PORTABLE CHEST 1 VIEW COMPARISON:  Chest x-ray 04/07/2023. FINDINGS: Dense consolidation in the right upper lobe. Worsening patchy consolidation also noted in the apical portion of the right upper lobe and in the perihilar aspect of the right mid to lower lung, partially obscuring the right heart border, suggesting developing right middle lobe involvement as well. Widespread areas of interstitial prominence and architectural distortion in the left lung, similar to prior examinations, likely reflective of areas of chronic postinfectious scarring and bronchiectasis, better demonstrated on prior chest CTs. No pleural effusions. No pneumothorax. No evidence of pulmonary edema. Heart size is normal. Mediastinal contours are distorted by patient positioning. IMPRESSION: 1. Worsening multilobar right-sided pneumonia, most severe in the right upper lobe, as above. Electronically Signed   By: Trudie Reed M.D.   On: 04/08/2023 07:01   DG Chest Portable 1 View  Result Date: 04/07/2023 CLINICAL DATA:  Shortness of breath EXAM: PORTABLE CHEST 1 VIEW COMPARISON:  Chest radiograph dated 04/26/2020, CT chest dated 04/27/2020 FINDINGS: Normal lung volumes. Right upper lung consolidation. Left upper lung linear scarring. No pleural  effusion or pneumothorax. The heart size and mediastinal contours are within normal limits. No acute osseous abnormality. IMPRESSION: Right upper lung consolidation, suspicious for pneumonia in the acute clinical setting. Electronically Signed   By: Agustin Cree M.D.   On: 04/07/2023 16:51    Microbiology: Results for orders placed or performed during the hospital encounter of 04/07/23  SARS Coronavirus 2 by RT PCR (hospital order, performed in [facility] hospital lab) *cepheid single result test* Anterior Nasal Swab     Status: Abnormal   Collection Time: 04/07/23  3:15 PM   Specimen: Anterior Nasal Swab  Result Value Ref Range Status   SARS Coronavirus 2 by RT PCR POSITIVE (A) NEGATIVE Final    Comment: (NOTE) SARS-CoV-2 target nucleic acids are DETECTED  SARS-CoV-2 RNA is generally detectable in upper respiratory specimens  during the acute phase of infection.  Positive results are indicative  of the presence of the identified virus, but do not rule out bacterial infection or co-infection with other pathogens not detected by the test.  Clinical correlation with patient history and  other diagnostic information is necessary to determine patient infection status.  The expected result is negative.  Fact Sheet for Patients:   RoadLapTop.co.za   Fact Sheet for Healthcare Providers:   http://kim-miller.com/    This test is not yet approved or cleared by the [place] FDA and  has been authorized for detection and/or diagnosis of SARS-CoV-2 by FDA under an Emergency Use Authorization (EUA).  This EUA will remain in effect (meaning this test can be used) for the duration of  the COVID-19 declaration under Section 564(b)(1)  of the Act, 21 U.S.C. section 360-bbb-3(b)(1), unless the authorization is terminated or revoked sooner.   Performed at [facility], [address] [place], [place] [address]   Blood culture (routine x 2)      Status: None   Collection Time: 04/07/23  3:19 PM   Specimen: BLOOD  Result Value Ref Range Status   Specimen Description BLOOD LEFT ANTECUBITAL  Final   Special Requests   Final    BOTTLES DRAWN AEROBIC AND ANAEROBIC Blood Culture adequate volume   Culture   Final    NO GROWTH 5 DAYS Performed at [facility], [address] [place], [place] [address]    Report Status 04/12/2023 FINAL  Final  Blood culture (routine x 2)     Status: None   Collection Time: 04/07/23  6:19 PM   Specimen: BLOOD  Result Value Ref Range Status   Specimen Description BLOOD BLOOD LEFT FOREARM  Final   Special Requests   Final    BOTTLES DRAWN AEROBIC AND ANAEROBIC Blood Culture adequate volume   Culture   Final    NO GROWTH 5 DAYS Performed at [facility], [address], [place], [place] [address]    Report Status 04/12/2023 FINAL  Final  MRSA Next Gen by PCR, Nasal     Status: Abnormal   Collection Time: 04/07/23  9:46 PM  Result Value Ref Range Status   MRSA by PCR Next Gen DETECTED (A) NOT DETECTED Final    Comment: RESULT CALLED TO, READ BACK BY AND VERIFIED WITH: JACOB MOORE RN @0058  04/08/23 ASW (NOTE) The GeneXpert MRSA Assay (FDA  approved for NASAL specimens only), is one component of a comprehensive MRSA colonization surveillance program. It is not intended to diagnose MRSA infection nor to guide or monitor treatment for MRSA infections. Test performance is not FDA approved in patients less than 45 years old. Performed at [facility], [address] [place], [place] [address]   Expectorated Sputum Assessment w Gram Stain, Rflx to Resp Cult     Status: None   Collection Time: 04/08/23  7:25 PM   Specimen: Sputum  Result Value Ref Range Status   Specimen Description SPUTUM  Final   Special Requests NONE  Final   Sputum evaluation   Final    THIS SPECIMEN IS ACCEPTABLE FOR SPUTUM CULTURE Performed at [facility], [address]  [place], [place] [address]    Report Status 04/08/2023 FINAL  Final  Culture, Respiratory w Gram Stain     Status: None   Collection Time: 04/08/23  7:25 PM   Specimen: SPU  Result Value Ref Range Status   Specimen Description   Final    SPUTUM Performed at [facility], [address], [place], [place] [address]    Special Requests   Final    NONE Reflexed from [id number] Performed at [facility], [address] [place], [place] [address]    Gram Stain   Final    RARE WBC PRESENT, PREDOMINANTLY PMN RARE GRAM POSITIVE COCCI Performed at [facility], [address] [place], [place] [address]    Culture RARE METHICILLIN RESISTANT STAPHYLOCOCCUS AUREUS  Final   Report Status 04/11/2023 FINAL  Final   Organism ID, Bacteria METHICILLIN RESISTANT STAPHYLOCOCCUS AUREUS  Final      Susceptibility   Methicillin resistant staphylococcus aureus - MIC*    CIPROFLOXACIN 4 RESISTANT Resistant     ERYTHROMYCIN <=0.25 SENSITIVE Sensitive     GENTAMICIN <=0.5 SENSITIVE Sensitive     OXACILLIN >=4 RESISTANT Resistant     TETRACYCLINE <=1 SENSITIVE Sensitive     VANCOMYCIN 1 SENSITIVE Sensitive     TRIMETH/SULFA <=10 SENSITIVE Sensitive     CLINDAMYCIN <=0.25 SENSITIVE Sensitive     RIFAMPIN <=0.5 SENSITIVE Sensitive     Inducible Clindamycin NEGATIVE Sensitive     LINEZOLID 2 SENSITIVE Sensitive     * RARE METHICILLIN RESISTANT STAPHYLOCOCCUS AUREUS    Labs: CBC: Recent Labs  Lab 04/07/23 1515 04/08/23 0231 04/08/23 1108 04/09/23 0243 04/11/23 0548 04/12/23 0455 04/13/23 0527  WBC 24.3* 26.7*  --  24.1* 29.8* 30.6* 30.2*  31.3*  NEUTROABS 21.4*  --   --   --   --   --  19.8*  HGB 16.2 13.7 13.2 12.1* 13.0 12.8* 13.3  13.3  HCT 48.7 42.2  --  35.7* 38.7* 38.4* 40.7  40.1  MCV 79.1* 81.6  --  77.8* 78.3* 78.7* 80.1  78.9*  PLT 159 145*  --  137* 236 246 283  287   Basic Metabolic Panel: Recent Labs  Lab 04/09/23 0243 04/10/23 0935 04/11/23 0548  04/12/23 0455 04/13/23 0527  NA 139 142 144 140 140  K 3.2* 3.2* 3.1* 3.0* 3.2*  CL 111 113* 115* 111 108  CO2 20* 21* 21* 23 24  GLUCOSE 149* 138* 140* 120* 105*  BUN 32* 33* 29* 28* 21  CREATININE 1.06 1.02 0.90 0.84 0.73  CALCIUM 7.6* 7.9* 7.9* 7.5* 7.5*  MG  --  2.2  --  2.2 2.0  PHOS  --  1.5* 1.8* 2.2*  2.9   Liver Function Tests: Recent Labs  Lab 04/07/23 1515 04/09/23 0243  AST 46* 23  ALT 25 24  ALKPHOS 66 47  BILITOT 3.7* 1.8*  PROT 6.8 5.2*  ALBUMIN 2.8* 1.9*   CBG: Recent Labs  Lab 04/08/23 0536  GLUCAP 175*    Discharge time spent: greater than 30 minutes.  This record has been created using Conservation officer, historic buildings. Errors have been sought and corrected,but may not always be located. Such creation errors do not reflect on the standard of care.   Signed: Arnetha Courser, MD [facility] 04/13/2023

## 2023-04-13 NOTE — TOC Transition Note (Signed)
Transition of Care [facility]) - CM/SW Discharge Note   Patient Details  Name: Timothy Juarez MRN: [id number] Date of Birth: 1951/06/17  Transition of [facility]) CM/SW Contact:  Margarito Liner, LCSW Phone Number: 04/13/2023, 1:36 PM   Clinical Narrative:  Patient has orders to discharge back to [facility] today. RN has already called report. EMS transport has been arranged and he is next on the list. No further concerns. CSW signing off.   Final next level of care: Skilled Nursing Facility Barriers to Discharge: Barriers Resolved   Patient Goals and CMS Choice CMS Medicare.gov Compare Post Acute Care list provided to:: Patient Choice offered to / list presented to : Patient  Discharge Placement                  Patient to be transferred to facility by: EMS Name of family member notified: Adolphus Birchwood Patient and family notified of of transfer: 04/13/23  Discharge Plan and Services Additional resources added to the After Visit Summary for       Post Acute Care Choice: Skilled Nursing Facility                               Social Determinants of Health (SDOH) Interventions SDOH Screenings   Tobacco Use: Medium Risk (04/07/2023)     Readmission Risk Interventions    04/09/2023   10:07 AM  Readmission Risk Prevention Plan  Transportation Screening Complete  PCP or Specialist Appt within 3-5 Days Complete  HRI or Home Care Consult Complete  Social Work Consult for Recovery Care Planning/Counseling Complete  Palliative Care Screening Not Applicable  Medication Review Oceanographer) Referral to Pharmacy

## 2023-04-13 NOTE — Plan of Care (Signed)
  Problem: Fluid Volume: Goal: Hemodynamic stability will improve Outcome: Progressing   Problem: Clinical Measurements: Goal: Diagnostic test results will improve Outcome: Progressing   Problem: Respiratory: Goal: Ability to maintain adequate ventilation will improve Outcome: Progressing   Problem: Activity: Goal: Risk for activity intolerance will decrease Outcome: Progressing   Problem: Nutrition: Goal: Adequate nutrition will be maintained Outcome: Progressing   Problem: Coping: Goal: Level of anxiety will decrease Outcome: Progressing   Problem: Elimination: Goal: Will not experience complications related to bowel motility Outcome: Progressing   Problem: Pain Managment: Goal: General experience of comfort will improve Outcome: Progressing   Problem: Safety: Goal: Ability to remain free from injury will improve Outcome: Progressing   Problem: Skin Integrity: Goal: Risk for impaired skin integrity will decrease Outcome: Progressing

## 2023-04-13 NOTE — Progress Notes (Signed)
Report called to [facility]; they are ready for him.

## 2023-04-13 NOTE — Care Management Important Message (Signed)
Important Message  Patient Details  Name: Timothy Juarez MRN: [id number] Date of Birth: 07/12/1951   Medicare Important Message Given:  Other (see comment)  Attempted to reach patient via room phone [phone]) to review Medicare IM.  Line busy at this time.   Johnell Comings 04/13/2023, 11:51 AM

## 2023-06-09 ENCOUNTER — Encounter: Payer: Self-pay | Admitting: Oncology

## 2023-07-01 ENCOUNTER — Other Ambulatory Visit: Payer: Self-pay | Admitting: Family Medicine

## 2023-07-01 DIAGNOSIS — R0602 Shortness of breath: Secondary | ICD-10-CM

## 2023-07-01 DIAGNOSIS — Z8701 Personal history of pneumonia (recurrent): Secondary | ICD-10-CM

## 2023-07-02 ENCOUNTER — Encounter: Payer: Self-pay | Admitting: Oncology

## 2023-07-07 ENCOUNTER — Ambulatory Visit
Admission: RE | Admit: 2023-07-07 | Discharge: 2023-07-07 | Disposition: A | Discharge status: Home / Self Care | Payer: Medicare Other | Source: Ambulatory Visit | Attending: Family Medicine | Admitting: Family Medicine

## 2023-07-07 DIAGNOSIS — R0602 Shortness of breath: Secondary | ICD-10-CM | POA: Diagnosis present

## 2023-07-07 DIAGNOSIS — Z8701 Personal history of pneumonia (recurrent): Secondary | ICD-10-CM | POA: Diagnosis present

## 2023-07-08 ENCOUNTER — Encounter: Payer: Self-pay | Admitting: Oncology

## 2023-07-15 ENCOUNTER — Ambulatory Visit: Payer: Medicare Other | Admitting: Student in an Organized Health Care Education/Training Program

## 2023-07-15 ENCOUNTER — Encounter: Payer: Self-pay | Admitting: Student in an Organized Health Care Education/Training Program

## 2023-07-15 VITALS — BP 136/64 | HR 76 | Temp 97.6°F | Wt 214.0 lb

## 2023-07-15 DIAGNOSIS — J4489 Other specified chronic obstructive pulmonary disease: Secondary | ICD-10-CM | POA: Diagnosis not present

## 2023-07-15 DIAGNOSIS — J8410 Pulmonary fibrosis, unspecified: Secondary | ICD-10-CM | POA: Diagnosis not present

## 2023-07-15 NOTE — Progress Notes (Signed)
Assessment & Plan:   1. Lung fibrosis (HCC) 2. Emphysema  The patient is presenting for evaluation of findings noted on his chest CT performed to document improvement in his right upper lobe consolidation from his previous hospitalization.  Patient is currently asymptomatic from a respiratory perspective.   I have personally reviewed his chest CT and noted improvement in the right upper lobe consolidation.  He does have significant emphysematous changes as well as scarring in the bilateral lungs that are stable compared to his previous chest CT from 2021.  There was a large bullae in the right lower lobe that is no longer evident on his most recent chest CT. there were subpleural areas of rounded consolidation within the bilateral lobes that were new from 2021 for which she will require repeat imaging.  I will order a repeat CT scan of his chest to be performed in 6 months after which I will see him in follow-up.  Should the CT document stability in the findings, we will see his further radiographic monitoring.  - CT CHEST WO CONTRAST; Future  Return in about 7 months (around 02/12/2024).  I spent 45 minutes caring for this patient today, including preparing to see the patient, obtaining a medical history , reviewing a separately obtained history, performing a medically appropriate examination and/or evaluation, counseling and educating the patient/family/caregiver, ordering medications, tests, or procedures, documenting clinical information in the electronic health record, and independently interpreting results (not separately reported/billed) and communicating results to the patient/family/caregiver  Raechel Chute, MD [facility] 07/15/2023 8:51 PM    End of visit medications:  No orders of the defined types were placed in this encounter.    Current Outpatient Medications:    acetaminophen (TYLENOL) 325 MG tablet, Take 650 mg by mouth every 4 (four) hours as needed  for fever. , Disp: , Rfl:    ALPHAGAN P 0.1 % SOLN, Place 1 drop into the right eye 3 (three) times daily., Disp: , Rfl:    apixaban (ELIQUIS) 2.5 MG TABS tablet, Take 1 tablet (2.5 mg total) by mouth 2 (two) times daily., Disp: 60 tablet, Rfl: 3   ascorbic acid (VITAMIN C) 500 MG tablet, Take 500 mg by mouth 2 (two) times daily., Disp: , Rfl:    aspirin EC 81 MG EC tablet, Take 1 tablet (81 mg total) by mouth daily. Swallow whole., Disp: 90 tablet, Rfl: 3   atorvastatin (LIPITOR) 10 MG tablet, Take 1 tablet (10 mg total) by mouth daily. (Patient taking differently: Take 5 mg by mouth daily.), Disp: 30 tablet, Rfl: 11   baclofen (LIORESAL) 10 MG tablet, Take 10 mg by mouth 3 (three) times daily. Hold for sedation, Disp: , Rfl:    Cholecalciferol (VITAMIN D3) 50 MCG (2000 UT) TABS, Take 4,000 Int'l Units by mouth daily., Disp: , Rfl:    docusate sodium (COLACE) 100 MG capsule, Take 100 mg by mouth daily as needed for mild constipation., Disp: , Rfl:    DULoxetine (CYMBALTA) 20 MG capsule, Take 40 mg by mouth daily., Disp: , Rfl:    fentaNYL (DURAGESIC) 25 MCG/HR, Place 1 patch onto the skin every 3 (three) days., Disp: , Rfl:    ferrous sulfate 324 (65 Fe) MG TBEC, Take 324 mg by mouth daily in the afternoon., Disp: , Rfl:    Fluticasone-Umeclidin-Vilant 100-62.5-25 MCG/ACT AEPB, Inhale 1 puff into the lungs every morning., Disp: , Rfl:    furosemide (LASIX) 40 MG tablet, Take 40 mg by mouth  daily., Disp: , Rfl:    gabapentin (NEURONTIN) 100 MG capsule, Take 100 mg by mouth every 12 (twelve) hours., Disp: , Rfl:    GOODSENSE ARTIFICIAL TEARS 0.5-0.6 % SOLN, Place into the right eye 4 (four) times daily., Disp: , Rfl:    guaiFENesin (ROBITUSSIN) 100 MG/5ML SOLN, Take 15 mLs by mouth 3 (three) times daily as needed for cough (congestion)., Disp: , Rfl:    ketorolac (ACULAR) 0.4 % SOLN, Place 1 drop into the right eye 2 (two) times daily., Disp: , Rfl:    loperamide (IMODIUM) 2 MG capsule, Take 2 mg by  mouth 3 (three) times daily as needed for diarrhea or loose stools., Disp: , Rfl:    nitroGLYCERIN (NITROSTAT) 0.4 MG SL tablet, Place 0.14 mg under the tongue every 5 (five) minutes as needed for chest pain., Disp: , Rfl:    Omega-3 Fatty Acids (FISH OIL) 1000 MG CAPS, Take 1,000 mg by mouth daily. , Disp: , Rfl:    Oxycodone HCl 10 MG TABS, Take 10 mg by mouth as needed., Disp: , Rfl:    OXYGEN, Inhale 1 L into the lungs at bedtime., Disp: , Rfl:    oxymetazoline (AFRIN) 0.05 % nasal spray, Place 2 sprays into both nostrils 2 (two) times daily as needed (For nose bleeds)., Disp: , Rfl:    pantoprazole (PROTONIX) 40 MG tablet, Take 40 mg by mouth 2 (two) times daily., Disp: , Rfl:    potassium chloride SA (KLOR-CON M) 20 MEQ tablet, Take 1 tablet (20 mEq total) by mouth 2 (two) times daily., Disp: , Rfl:    sodium chloride (OCEAN) 0.65 % SOLN nasal spray, Place 1 spray into both nostrils 2 (two) times daily as needed for congestion., Disp: , Rfl:    spiritus frumenti (ETHYL ALCOHOL) SOLN, 3 oz of bourbon by mouth at bed time as needed for sleep, pleasure., Disp: , Rfl:    tamsulosin (FLOMAX) 0.4 MG CAPS capsule, Take 1 capsule (0.4 mg total) by mouth daily., Disp: 30 capsule, Rfl: 0   timolol (TIMOPTIC) 0.5 % ophthalmic solution, Place 1 drop into the right eye daily., Disp: , Rfl:    Subjective:   PATIENT ID: Timothy Juarez GENDER: male DOB: 1951/04/21, MRN: [id number]  Chief Complaint  Patient presents with   Consult    No cough, shortness of breath or wheezing.     HPI  Patient is a 72 year-old male presenting from his nursing facility for the evaluation of findings of scarring on his chest CT.  Patient was recently admitted to the hospital in August 2024 (8/20 - 8/26) with increased shortness of breath and diagnosed with hypoxic respiratory failure secondary to community-acquired pneumonia requiring the initiation of oxygen therapy as well as IV antibiotics.  He underwent chest  imaging that included chest x-ray noted for a dense consolidation of the right upper lobe consistent with pneumonia.  He was recommended repeat outpatient imaging to ensure improvement.  He underwent a CT scan of the chest performed on 07/07/2023 showing near complete resolution of the right upper lobe consolidation previously seen on chest x-ray with linear consolidation likely representing post infectious scarring.  The CT was also noted for bullous emphysematous changes that are unchanged when compared to his previous chest CT in 2021.  Today, the patient feels he is in his usual state of health.  He denies any shortness of breath or respiratory symptoms whatsoever.  He denies any cough, denies any sputum production, denies any fevers,  and denies any chills.  He does not have any chest pain or chest tightness.  He is on sure as to why he is presenting to clinic today.  Patient has a history of smoking, with 44 pack years of smoking history, quit in 2014.  He denies any use of illicit drugs.  He previously worked in [employer].  Ancillary information including prior medications, full medical/surgical/family/social histories, and PFTs (when available) are listed below and have been reviewed.   Review of Systems  Constitutional:  Negative for chills, fever and weight loss.  Respiratory:  Negative for cough, hemoptysis, sputum production and shortness of breath.   Cardiovascular:  Negative for chest pain.     Objective:   Vitals:   07/15/23 1118  BP: 136/64  Pulse: 76  Temp: 97.6 F (36.4 C)  TempSrc: Temporal  SpO2: 94 %  Weight: 214 lb (97.1 kg)   94% on RA BMI Readings from Last 3 Encounters:  07/15/23 34.54 kg/m  04/13/23 35.09 kg/m  04/06/23 29.55 kg/m   Wt Readings from Last 3 Encounters:  07/15/23 214 lb (97.1 kg)  04/13/23 217 lb 6 oz (98.6 kg)  04/06/23 224 lb (101.6 kg)    Physical Exam Constitutional:      Appearance: Normal appearance.  Cardiovascular:      Rate and Rhythm: Normal rate and regular rhythm.     Pulses: Normal pulses.     Heart sounds: Normal heart sounds.  Pulmonary:     Effort: Pulmonary effort is normal.     Breath sounds: Rales present. No wheezing or rhonchi.  Musculoskeletal:     Comments: Bilateral AKA  Neurological:     General: No focal deficit present.     Mental Status: He is alert. Mental status is at baseline.       Ancillary Information    Past Medical History:  Diagnosis Date   Acute embolism and thombos unsp deep vn unsp lower extremity (HCC)    Acute respiratory failure (HCC)    AKI (acute kidney injury) (HCC) 12/27/2018   Allergy    Anemia    Aneurysm of unspecified site (HCC)    ARF (acute respiratory failure) (HCC)    H/O   Bronchitis    Cellulitis and abscess of leg 09/27/2019   CHF (congestive heart failure) (HCC)    COPD (chronic obstructive pulmonary disease) (HCC)    Cough    Epistaxis    Gastrointestinal hemorrhage    GERD (gastroesophageal reflux disease)    Gout    Hematemesis 04/27/2020   High anion gap metabolic acidosis 12/27/2018   Hyperlipidemia    Hypertension    Hypokalemia    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) 09/27/2019   Insomnia    Iron deficiency anemia 04/21/2017   Muscle contracture    MUSCLE SPASMS, muscle weakness   Peripheral vascular disease (HCC)    Pneumonia    Pressure ulcer    Severe sepsis (HCC) 12/27/2018   Toxic encephalopathy 12/27/2018     Family History  Problem Relation Age of Onset   Heart attack Mother    Varicose Veins Neg Hx      Past Surgical History:  Procedure Laterality Date   AMPUTATION Left 09/19/2015   Procedure: AMPUTATION BELOW KNEE;  Surgeon: Annice Needy, MD;  Location: [facility];  Service: Vascular;  Laterality: Left;   AMPUTATION Left 11/01/2015   Procedure: AMPUTATION ABOVE KNEE;  Surgeon: Annice Needy, MD;  Location: [facility];  Service:  Vascular;  Laterality: Left;   AMPUTATION Left 09/29/2019   Procedure:  IRRIGATION AND DEBRIDEMENT OF LEFT AKA;  Surgeon: Annice Needy, MD;  Location: [facility];  Service: General;  Laterality: Left;   AMPUTATION Right 07/18/2021   Procedure: AMPUTATION ABOVE KNEE;  Surgeon: Annice Needy, MD;  Location: [facility];  Service: Vascular;  Laterality: Right;   APPLICATION OF WOUND VAC Left 10/18/2015   Procedure: APPLICATION OF WOUND VAC;  Surgeon: Annice Needy, MD;  Location: [facility];  Service: Vascular;  Laterality: Left;   APPLICATION OF WOUND VAC Left 09/30/2019   Procedure: APPLICATION OF WOUND VAC;  Surgeon: Renford Dills, MD;  Location: [facility];  Service: Vascular;  Laterality: Left;  serial # [id number]   CARDIAC CATHETERIZATION     CATARACT EXTRACTION W/PHACO Right 08/18/2019   Procedure: CATARACT EXTRACTION PHACO AND INTRAOCULAR LENS PLACEMENT (IOC) RIGHT;  Surgeon: Galen Manila, MD;  Location: [facility];  Service: Ophthalmology;  Laterality: Right;  [place] 03:29.0 CDE 45.68 Fluid Pack Lot # [id number]   CENTRAL VENOUS CATHETER INSERTION Right 09/30/2019   Procedure: INSERTION CENTRAL LINE ADULT;  Surgeon: Renford Dills, MD;  Location: [facility];  Service: Vascular;  Laterality: Right;   COLONOSCOPY WITH PROPOFOL N/A 05/25/2017   Procedure: COLONOSCOPY WITH PROPOFOL;  Surgeon: Wyline Mood, MD;  Location: [facility] ENDOSCOPY;  Service: Gastroenterology;  Laterality: N/A;   COLONOSCOPY WITH PROPOFOL N/A 10/14/2017   Procedure: COLONOSCOPY WITH PROPOFOL;  Surgeon: Wyline Mood, MD;  Location: [facility] ENDOSCOPY;  Service: Gastroenterology;  Laterality: N/A;   ESOPHAGOGASTRODUODENOSCOPY (EGD) WITH PROPOFOL N/A 05/25/2017   Procedure: ESOPHAGOGASTRODUODENOSCOPY (EGD) WITH PROPOFOL;  Surgeon: Wyline Mood, MD;  Location: [facility] ENDOSCOPY;  Service: Gastroenterology;  Laterality: N/A;   ESOPHAGOGASTRODUODENOSCOPY (EGD) WITH PROPOFOL N/A 10/14/2017   Procedure: ESOPHAGOGASTRODUODENOSCOPY (EGD) WITH PROPOFOL;  Surgeon: Wyline Mood, MD;  Location[facility]COPY;  Service:  Gastroenterology;  Laterality: N/A;   ESOPHAGOGASTRODUODENOSCOPY (EGD) WITH PROPOFOL N/A 04/27/2020   Procedure: ESOPHAGOGASTRODUODENOSCOPY (EGD) WITH PROPOFOL;  Surgeon: Toney Reil, MD;  Location: [facility] ENDOSCOPY;  Service: Gastroenterology;  Laterality: N/A;   ESOPHAGOGASTRODUODENOSCOPY (EGD) WITH PROPOFOL N/A 06/18/2020   Procedure: ESOPHAGOGASTRODUODENOSCOPY (EGD) WITH PROPOFOL;  Surgeon: Toney Reil, MD;  Location: [facility] ENDOSCOPY;  Service: Gastroenterology;  Laterality: N/A;   EYE SURGERY     GIVENS CAPSULE STUDY N/A 07/14/2017   Procedure: GIVENS CAPSULE STUDY;  Surgeon: Wyline Mood, MD;  Location: [facility] ENDOSCOPY;  Service: Gastroenterology;  Laterality: N/A;   GIVENS CAPSULE STUDY N/A 10/30/2017   Procedure: GIVENS CAPSULE STUDY 12 HR;  Surgeon: Wyline Mood, MD;  Location: [facility] ENDOSCOPY;  Service: Gastroenterology;  Laterality: N/A;   IVC FILTER INSERTION N/A 10/12/2017   Procedure: IVC FILTER INSERTION;  Surgeon: Annice Needy, MD;  Location: [facility] INVASIVE CV LAB;  Service: Cardiovascular;  Laterality: N/A;   LOWER EXTREMITY ANGIOGRAPHY Right 10/04/2018   Procedure: LOWER EXTREMITY ANGIOGRAPHY;  Surgeon: Annice Needy, MD;  Location: [facility];  Service: Cardiovascular;  Laterality: Right;   LOWER EXTREMITY ANGIOGRAPHY Right 03/21/2021   Procedure: LOWER EXTREMITY ANGIOGRAPHY;  Surgeon: Annice Needy, MD;  Location: [facility];  Service: Cardiovascular;  Laterality: Right;   LOWER EXTREMITY ANGIOGRAPHY Right 05/23/2021   Procedure: LOWER EXTREMITY ANGIOGRAPHY;  Surgeon: Annice Needy, MD;  Location: [facility];  Service: Cardiovascular;  Laterality: Right;   PERIPHERAL VASCULAR CATHETERIZATION Left 01/18/2015   Procedure: Lower Extremity Angiography;  Surgeon: Annice Needy, MD;  Location: [facility] INVASIVE CV  LAB;  Service: Cardiovascular;  Laterality: Left;   PERIPHERAL VASCULAR CATHETERIZATION N/A 01/18/2015   Procedure: Lower Extremity Intervention;   Surgeon: Annice Needy, MD;  Location: [facility];  Service: Cardiovascular;  Laterality: N/A;   PERIPHERAL VASCULAR CATHETERIZATION  07/30/2015   Procedure: Lower Extremity Intervention;  Surgeon: Annice Needy, MD;  Location: [facility];  Service: Cardiovascular;;   PERIPHERAL VASCULAR CATHETERIZATION N/A 07/30/2015   Procedure: Abdominal Aortogram w/Lower Extremity;  Surgeon: Annice Needy, MD;  Location: [facility];  Service: Cardiovascular;  Laterality: N/A;   PERIPHERAL VASCULAR CATHETERIZATION Left 08/22/2015   Procedure: Lower Extremity Angiography;  Surgeon: Annice Needy, MD;  Location: [facility];  Service: Cardiovascular;  Laterality: Left;   PERIPHERAL VASCULAR CATHETERIZATION Left 08/22/2015   Procedure: Lower Extremity Intervention;  Surgeon: Annice Needy, MD;  Location: [facility];  Service: Cardiovascular;  Laterality: Left;   PERIPHERAL VASCULAR CATHETERIZATION Right 03/31/2016   Procedure: Lower Extremity Angiography;  Surgeon: Annice Needy, MD;  Location: [facility];  Service: Cardiovascular;  Laterality: Right;   PERIPHERAL VASCULAR CATHETERIZATION  03/31/2016   Procedure: Lower Extremity Intervention;  Surgeon: Annice Needy, MD;  Location: [facility];  Service: Cardiovascular;;   PERIPHERAL VASCULAR CATHETERIZATION Left 04/10/2016   Procedure: Lower Extremity Angiography;  Surgeon: Annice Needy, MD;  Location: [facility];  Service: Cardiovascular;  Laterality: Left;   VACUUM ASSISTED CLOSURE CHANGE Left 10/03/2019   Procedure: LEFT THIGH VACUUM ASSISTED CLOSURE CHANGE;  Surgeon: Annice Needy, MD;  Location: [facility];  Service: General;  Laterality: Left;   WOUND DEBRIDEMENT Left 10/18/2015   Procedure: DEBRIDEMENT WOUND   ( LEFT BKA DEBRIDEMENT );  Surgeon: Annice Needy, MD;  Location: [facility];  Service: Vascular;  Laterality: Left;    Social History   Socioeconomic History   Marital status: Legally Separated     Spouse name: Not on file   Number of children: Not on file   Years of education: Not on file   Highest education level: Not on file  Occupational History   Not on file  Tobacco Use   Smoking status: Former    Current packs/day: 0.00    Average packs/day: 1 pack/day for 44.0 years (44.0 ttl pk-yrs)    Types: Cigarettes    Start date: 11/17/1968    Quit date: 11/17/2012    Years since quitting: 10.6   Smokeless tobacco: Never  Vaping Use   Vaping status: Never Used  Substance and Sexual Activity   Alcohol use: Not Currently   Drug use: Never   Sexual activity: Not Currently  Other Topics Concern   Not on file  Social History Narrative   Not on file   Social Determinants of Health   Financial Resource Strain: Not on file  Food Insecurity: Not on file  Transportation Needs: Not on file  Physical Activity: Not on file  Stress: Not on file  Social Connections: Not on file  Intimate Partner Violence: Not on file     No Known Allergies   CBC    Component Value Date/Time   WBC 31.3 (H) 04/13/2023 0527   WBC 30.2 (H) 04/13/2023 0527   RBC 5.08 04/13/2023 0527   RBC 5.08 04/13/2023 0527   HGB 13.3 04/13/2023 0527   HGB 13.3 04/13/2023 0527   HGB 11.2 (L) 05/07/2020 1409   HCT 40.1 04/13/2023 0527   HCT 40.7  04/13/2023 0527   HCT 36.6 (L) 05/07/2020 1409   PLT 287 04/13/2023 0527   PLT 283 04/13/2023 0527   PLT 342 05/07/2020 1409   MCV 78.9 (L) 04/13/2023 0527   MCV 80.1 04/13/2023 0527   MCV 88 05/07/2020 1409   MCV 84 12/01/2013 1317   MCH 26.2 04/13/2023 0527   MCH 26.2 04/13/2023 0527   MCHC 33.2 04/13/2023 0527   MCHC 32.7 04/13/2023 0527   RDW 15.0 04/13/2023 0527   RDW 15.3 04/13/2023 0527   RDW 16.1 (H) 05/07/2020 1409   RDW 13.9 12/01/2013 1317   LYMPHSABS 2.8 04/13/2023 0527   LYMPHSABS 3.2 04/03/2013 0529   MONOABS 1.0 04/13/2023 0527   MONOABS 1.3 (H) 04/03/2013 0529   EOSABS 0.2 04/13/2023 0527   EOSABS 0.2 04/03/2013 0529   BASOSABS 0.1  04/13/2023 0527   BASOSABS 0.1 04/03/2013 0529    Pulmonary Functions Testing Results:     No data to display          Outpatient Medications Prior to Visit  Medication Sig Dispense Refill   acetaminophen (TYLENOL) 325 MG tablet Take 650 mg by mouth every 4 (four) hours as needed for fever.      ALPHAGAN P 0.1 % SOLN Place 1 drop into the right eye 3 (three) times daily.     apixaban (ELIQUIS) 2.5 MG TABS tablet Take 1 tablet (2.5 mg total) by mouth 2 (two) times daily. 60 tablet 3   ascorbic acid (VITAMIN C) 500 MG tablet Take 500 mg by mouth 2 (two) times daily.     aspirin EC 81 MG EC tablet Take 1 tablet (81 mg total) by mouth daily. Swallow whole. 90 tablet 3   atorvastatin (LIPITOR) 10 MG tablet Take 1 tablet (10 mg total) by mouth daily. (Patient taking differently: Take 5 mg by mouth daily.) 30 tablet 11   baclofen (LIORESAL) 10 MG tablet Take 10 mg by mouth 3 (three) times daily. Hold for sedation     Cholecalciferol (VITAMIN D3) 50 MCG (2000 UT) TABS Take 4,000 Int'l Units by mouth daily.     docusate sodium (COLACE) 100 MG capsule Take 100 mg by mouth daily as needed for mild constipation.     DULoxetine (CYMBALTA) 20 MG capsule Take 40 mg by mouth daily.     fentaNYL (DURAGESIC) 25 MCG/HR Place 1 patch onto the skin every 3 (three) days.     ferrous sulfate 324 (65 Fe) MG TBEC Take 324 mg by mouth daily in the afternoon.     Fluticasone-Umeclidin-Vilant 100-62.5-25 MCG/ACT AEPB Inhale 1 puff into the lungs every morning.     furosemide (LASIX) 40 MG tablet Take 40 mg by mouth daily.     gabapentin (NEURONTIN) 100 MG capsule Take 100 mg by mouth every 12 (twelve) hours.     GOODSENSE ARTIFICIAL TEARS 0.5-0.6 % SOLN Place into the right eye 4 (four) times daily.     guaiFENesin (ROBITUSSIN) 100 MG/5ML SOLN Take 15 mLs by mouth 3 (three) times daily as needed for cough (congestion).     ketorolac (ACULAR) 0.4 % SOLN Place 1 drop into the right eye 2 (two) times daily.      loperamide (IMODIUM) 2 MG capsule Take 2 mg by mouth 3 (three) times daily as needed for diarrhea or loose stools.     nitroGLYCERIN (NITROSTAT) 0.4 MG SL tablet Place 0.14 mg under the tongue every 5 (five) minutes as needed for chest pain.     Omega-3  Fatty Acids (FISH OIL) 1000 MG CAPS Take 1,000 mg by mouth daily.      Oxycodone HCl 10 MG TABS Take 10 mg by mouth as needed.     OXYGEN Inhale 1 L into the lungs at bedtime.     oxymetazoline (AFRIN) 0.05 % nasal spray Place 2 sprays into both nostrils 2 (two) times daily as needed (For nose bleeds).     pantoprazole (PROTONIX) 40 MG tablet Take 40 mg by mouth 2 (two) times daily.     potassium chloride SA (KLOR-CON M) 20 MEQ tablet Take 1 tablet (20 mEq total) by mouth 2 (two) times daily.     sodium chloride (OCEAN) 0.65 % SOLN nasal spray Place 1 spray into both nostrils 2 (two) times daily as needed for congestion.     spiritus frumenti (ETHYL ALCOHOL) SOLN 3 oz of bourbon by mouth at bed time as needed for sleep, pleasure.     tamsulosin (FLOMAX) 0.4 MG CAPS capsule Take 1 capsule (0.4 mg total) by mouth daily. 30 capsule 0   timolol (TIMOPTIC) 0.5 % ophthalmic solution Place 1 drop into the right eye daily.     No facility-administered medications prior to visit.

## 2024-02-05 ENCOUNTER — Ambulatory Visit
Admission: RE | Admit: 2024-02-05 | Discharge: 2024-02-05 | Disposition: A | Discharge status: Home / Self Care | Payer: Medicare Other | Source: Ambulatory Visit | Attending: Student in an Organized Health Care Education/Training Program | Admitting: Student in an Organized Health Care Education/Training Program

## 2024-02-05 DIAGNOSIS — J8410 Pulmonary fibrosis, unspecified: Secondary | ICD-10-CM | POA: Insufficient documentation

## 2024-03-06 ENCOUNTER — Encounter: Payer: Self-pay | Admitting: Oncology

## 2024-03-23 ENCOUNTER — Ambulatory Visit: Payer: Self-pay | Admitting: Student in an Organized Health Care Education/Training Program

## 2024-03-29 ENCOUNTER — Encounter: Payer: Self-pay | Admitting: Oncology

## 2024-04-05 ENCOUNTER — Other Ambulatory Visit (INDEPENDENT_AMBULATORY_CARE_PROVIDER_SITE_OTHER): Payer: Medicare Other

## 2024-04-05 ENCOUNTER — Ambulatory Visit (INDEPENDENT_AMBULATORY_CARE_PROVIDER_SITE_OTHER): Payer: Medicare Other | Admitting: Nurse Practitioner

## 2024-04-05 ENCOUNTER — Encounter (INDEPENDENT_AMBULATORY_CARE_PROVIDER_SITE_OTHER): Payer: Self-pay | Admitting: Nurse Practitioner

## 2024-04-05 VITALS — BP 165/72 | HR 74

## 2024-04-05 DIAGNOSIS — I10 Essential (primary) hypertension: Secondary | ICD-10-CM

## 2024-04-05 DIAGNOSIS — E785 Hyperlipidemia, unspecified: Secondary | ICD-10-CM | POA: Diagnosis not present

## 2024-04-05 DIAGNOSIS — R0989 Other specified symptoms and signs involving the circulatory and respiratory systems: Secondary | ICD-10-CM | POA: Diagnosis not present

## 2024-04-05 DIAGNOSIS — I6523 Occlusion and stenosis of bilateral carotid arteries: Secondary | ICD-10-CM | POA: Diagnosis not present

## 2024-04-05 NOTE — Progress Notes (Signed)
 Subjective:    Patient ID: Timothy Juarez, male    DOB: 1950/12/27, 73 y.o.   MRN: [id number] Chief Complaint  Patient presents with   Follow-up     1 year follow up with carotid see gs/fb     The patient returns today for carotid artery duplex.  He was last seen about a year or so ago and a bruit was auscultated at that time leading to this follow-up.  Patient denies any TIA or CVA-like symptoms.  He denies any amaurosis fugax.  Today noninvasive study showed 1 to 39% stenosis of the right ICA with a occlusion of the left ICA.  Based on the velocities I suspect this is in the higher end of that bracket.  There is also no flow noted within the right vertebral artery.    Review of Systems  Neurological:  Positive for weakness.  All other systems reviewed and are negative.      Objective:   Physical Exam Vitals reviewed.  HENT:     Head: Normocephalic.  Neck:     Vascular: Carotid bruit present.  Cardiovascular:     Rate and Rhythm: Normal rate.  Pulmonary:     Effort: Pulmonary effort is normal.  Musculoskeletal:     Right Lower Extremity: Right leg is amputated above knee.     Left Lower Extremity: Left leg is amputated above knee.  Skin:    General: Skin is warm and dry.  Neurological:     Mental Status: He is alert and oriented to person, place, and time.  Psychiatric:        Mood and Affect: Mood normal.        Behavior: Behavior normal.        Thought Content: Thought content normal.        Judgment: Judgment normal.     BP (!) 165/72   Pulse 74   Past Medical History:  Diagnosis Date   Acute embolism and thombos unsp deep vn unsp lower extremity (HCC)    Acute respiratory failure (HCC)    AKI (acute kidney injury) (HCC) 12/27/2018   Allergy    Anemia    Aneurysm of unspecified site (HCC)    ARF (acute respiratory failure) (HCC)    H/O   Bronchitis    Cellulitis and abscess of leg 09/27/2019   CHF (congestive heart failure) (HCC)    COPD (chronic  obstructive pulmonary disease) (HCC)    Cough    Epistaxis    Gastrointestinal hemorrhage    GERD (gastroesophageal reflux disease)    Gout    Hematemesis 04/27/2020   High anion gap metabolic acidosis 12/27/2018   Hyperlipidemia    Hypertension    Hypokalemia    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) 09/27/2019   Insomnia    Iron  deficiency anemia 04/21/2017   Muscle contracture    MUSCLE SPASMS, muscle weakness   Peripheral vascular disease (HCC)    Pneumonia    Pressure ulcer    Severe sepsis (HCC) 12/27/2018   Toxic encephalopathy 12/27/2018    Social History   Socioeconomic History   Marital status: Legally Separated    Spouse name: Not on file   Number of children: Not on file   Years of education: Not on file   Highest education level: Not on file  Occupational History   Not on file  Tobacco Use   Smoking status: Former    Current packs/day: 0.00    Average packs/day:  1 pack/day for 44.0 years (44.0 ttl pk-yrs)    Types: Cigarettes    Start date: 11/17/1968    Quit date: 11/17/2012    Years since quitting: 11.3   Smokeless tobacco: Never  Vaping Use   Vaping status: Never Used  Substance and Sexual Activity   Alcohol use: Not Currently   Drug use: Never   Sexual activity: Not Currently  Other Topics Concern   Not on file  Social History Narrative   Not on file   Social Drivers of Health   Financial Resource Strain: Not on file  Food Insecurity: Not on file  Transportation Needs: Not on file  Physical Activity: Not on file  Stress: Not on file  Social Connections: Not on file  Intimate Partner Violence: Not on file    Past Surgical History:  Procedure Laterality Date   AMPUTATION Left 09/19/2015   Procedure: AMPUTATION BELOW KNEE;  Surgeon: Selinda GORMAN Gu, MD;  Location: [facility];  Service: Vascular;  Laterality: Left;   AMPUTATION Left 11/01/2015   Procedure: AMPUTATION ABOVE KNEE;  Surgeon: Selinda GORMAN Gu, MD;  Location: [facility];  Service:  Vascular;  Laterality: Left;   AMPUTATION Left 09/29/2019   Procedure: IRRIGATION AND DEBRIDEMENT OF LEFT AKA;  Surgeon: Gu Selinda GORMAN, MD;  Location: [facility];  Service: General;  Laterality: Left;   AMPUTATION Right 07/18/2021   Procedure: AMPUTATION ABOVE KNEE;  Surgeon: Gu Selinda GORMAN, MD;  Location: [facility];  Service: Vascular;  Laterality: Right;   APPLICATION OF WOUND VAC Left 10/18/2015   Procedure: APPLICATION OF WOUND VAC;  Surgeon: Selinda GORMAN Gu, MD;  Location: [facility];  Service: Vascular;  Laterality: Left;   APPLICATION OF WOUND VAC Left 09/30/2019   Procedure: APPLICATION OF WOUND VAC;  Surgeon: Jama Cordella MATSU, MD;  Location: [facility];  Service: Vascular;  Laterality: Left;  serial # [id number]   CARDIAC CATHETERIZATION     CATARACT EXTRACTION W/PHACO Right 08/18/2019   Procedure: CATARACT EXTRACTION PHACO AND INTRAOCULAR LENS PLACEMENT (IOC) RIGHT;  Surgeon: Jaye Fallow, MD;  Location: [facility];  Service: Ophthalmology;  Laterality: Right;  US  03:29.0 CDE 45.68 Fluid Pack Lot # [id number]   CENTRAL VENOUS CATHETER INSERTION Right 09/30/2019   Procedure: INSERTION CENTRAL LINE ADULT;  Surgeon: Jama Cordella MATSU, MD;  Location: [facility];  Service: Vascular;  Laterality: Right;   COLONOSCOPY WITH PROPOFOL  N/A 05/25/2017   Procedure: COLONOSCOPY WITH PROPOFOL ;  Surgeon: Therisa Bi, MD;  Location: [facility]COPY;  Service: Gastroenterology;  Laterality: N/A;   COLONOSCOPY WITH PROPOFOL  N/A 10/14/2017   Procedure: COLONOSCOPY WITH PROPOFOL ;  Surgeon: Therisa Bi, MD;  Location: [facility] ENDOSCOPY;  Service: Gastroenterology;  Laterality: N/A;   ESOPHAGOGASTRODUODENOSCOPY (EGD) WITH PROPOFOL  N/A 05/25/2017   Procedure: ESOPHAGOGASTRODUODENOSCOPY (EGD) WITH PROPOFOL ;  Surgeon: Therisa Bi, MD;  Location: [facility]COPY;  Service: Gastroenterology;  Laterality: N/A;   ESOPHAGOGASTRODUODENOSCOPY (EGD) WITH PROPOFOL  N/A 10/14/2017   Procedure: ESOPHAGOGASTRODUODENOSCOPY (EGD) WITH PROPOFOL ;   Surgeon: Therisa Bi, MD;  Location: [facility] ENDOSCOPY;  Service: Gastroenterology;  Laterality: N/A;   ESOPHAGOGASTRODUODENOSCOPY (EGD) WITH PROPOFOL  N/A 04/27/2020   Procedure: ESOPHAGOGASTRODUODENOSCOPY (EGD) WITH PROPOFOL ;  Surgeon: Unk Corinn Skiff, MD;  Location: [facility];  Service: Gastroenterology;  Laterality: N/A;   ESOPHAGOGASTRODUODENOSCOPY (EGD) WITH PROPOFOL  N/A 06/18/2020   Procedure: ESOPHAGOGASTRODUODENOSCOPY (EGD) WITH PROPOFOL ;  Surgeon: Unk Corinn Skiff, MD;  Location: [facility]COPY;  Service: Gastroenterology;  Laterality: N/A;   EYE SURGERY     GIVENS CAPSULE STUDY N/A 07/14/2017   Procedure:  GIVENS CAPSULE STUDY;  Surgeon: Therisa Bi, MD;  Location: [facility] ENDOSCOPY;  Service: Gastroenterology;  Laterality: N/A;   GIVENS CAPSULE STUDY N/A 10/30/2017   Procedure: GIVENS CAPSULE STUDY 12 HR;  Surgeon: Therisa Bi, MD;  Location: [facility];  Service: Gastroenterology;  Laterality: N/A;   IVC FILTER INSERTION N/A 10/12/2017   Procedure: IVC FILTER INSERTION;  Surgeon: Marea Selinda RAMAN, MD;  Location: [facility];  Service: Cardiovascular;  Laterality: N/A;   LOWER EXTREMITY ANGIOGRAPHY Right 10/04/2018   Procedure: LOWER EXTREMITY ANGIOGRAPHY;  Surgeon: Marea Selinda RAMAN, MD;  Location: [facility];  Service: Cardiovascular;  Laterality: Right;   LOWER EXTREMITY ANGIOGRAPHY Right 03/21/2021   Procedure: LOWER EXTREMITY ANGIOGRAPHY;  Surgeon: Marea Selinda RAMAN, MD;  Location: [facility];  Service: Cardiovascular;  Laterality: Right;   LOWER EXTREMITY ANGIOGRAPHY Right 05/23/2021   Procedure: LOWER EXTREMITY ANGIOGRAPHY;  Surgeon: Marea Selinda RAMAN, MD;  Location: [facility];  Service: Cardiovascular;  Laterality: Right;   PERIPHERAL VASCULAR CATHETERIZATION Left 01/18/2015   Procedure: Lower Extremity Angiography;  Surgeon: Selinda RAMAN Marea, MD;  Location: [facility];  Service: Cardiovascular;  Laterality: Left;   PERIPHERAL VASCULAR CATHETERIZATION  N/A 01/18/2015   Procedure: Lower Extremity Intervention;  Surgeon: Selinda RAMAN Marea, MD;  Location: [facility];  Service: Cardiovascular;  Laterality: N/A;   PERIPHERAL VASCULAR CATHETERIZATION  07/30/2015   Procedure: Lower Extremity Intervention;  Surgeon: Selinda RAMAN Marea, MD;  Location: [facility];  Service: Cardiovascular;;   PERIPHERAL VASCULAR CATHETERIZATION N/A 07/30/2015   Procedure: Abdominal Aortogram w/Lower Extremity;  Surgeon: Selinda RAMAN Marea, MD;  Location: [facility];  Service: Cardiovascular;  Laterality: N/A;   PERIPHERAL VASCULAR CATHETERIZATION Left 08/22/2015   Procedure: Lower Extremity Angiography;  Surgeon: Selinda RAMAN Marea, MD;  Location: [facility];  Service: Cardiovascular;  Laterality: Left;   PERIPHERAL VASCULAR CATHETERIZATION Left 08/22/2015   Procedure: Lower Extremity Intervention;  Surgeon: Selinda RAMAN Marea, MD;  Location: [facility];  Service: Cardiovascular;  Laterality: Left;   PERIPHERAL VASCULAR CATHETERIZATION Right 03/31/2016   Procedure: Lower Extremity Angiography;  Surgeon: Selinda RAMAN Marea, MD;  Location: [facility];  Service: Cardiovascular;  Laterality: Right;   PERIPHERAL VASCULAR CATHETERIZATION  03/31/2016   Procedure: Lower Extremity Intervention;  Surgeon: Selinda RAMAN Marea, MD;  Location: [facility];  Service: Cardiovascular;;   PERIPHERAL VASCULAR CATHETERIZATION Left 04/10/2016   Procedure: Lower Extremity Angiography;  Surgeon: Selinda RAMAN Marea, MD;  Location: [facility];  Service: Cardiovascular;  Laterality: Left;   VACUUM ASSISTED CLOSURE CHANGE Left 10/03/2019   Procedure: LEFT THIGH VACUUM ASSISTED CLOSURE CHANGE;  Surgeon: Marea Selinda RAMAN, MD;  Location: [facility];  Service: General;  Laterality: Left;   WOUND DEBRIDEMENT Left 10/18/2015   Procedure: DEBRIDEMENT WOUND   ( LEFT BKA DEBRIDEMENT );  Surgeon: Selinda RAMAN Marea, MD;  Location: [facility];  Service: Vascular;  Laterality: Left;    Family History  Problem  Relation Age of Onset   Heart attack Mother    Varicose Veins Neg Hx     No Known Allergies     Latest Ref Rng & Units 04/13/2023    5:27 AM 04/12/2023    4:55 AM 04/11/2023    5:48 AM  CBC  WBC 4.0 - 10.5 K/uL 4.0 - 10.5 K/uL 31.3    30.2  30.6  29.8   Hemoglobin 13.0 - 17.0 g/dL  13.0 - 17.0 g/dL 86.6    86.6  87.1  86.9   Hematocrit 39.0 - 52.0 % 39.0 - 52.0 % 40.1    40.7  38.4  38.7   Platelets 150 - 400 K/uL 150 - 400 K/uL 287    283  246  236       CMP     Component Value Date/Time   NA 140 04/13/2023 0527   NA 136 12/01/2013 1317   K 3.2 (L) 04/13/2023 0527   K 3.9 12/01/2013 1317   CL 108 04/13/2023 0527   CL 100 12/01/2013 1317   CO2 24 04/13/2023 0527   CO2 33 (H) 12/01/2013 1317   GLUCOSE 105 (H) 04/13/2023 0527   GLUCOSE 91 12/01/2013 1317   BUN 21 04/13/2023 0527   BUN 24 (H) 09/25/2014 0846   CREATININE 0.73 04/13/2023 0527   CREATININE 1.29 09/25/2014 0846   CALCIUM  7.5 (L) 04/13/2023 0527   CALCIUM  9.4 12/01/2013 1317   PROT 5.2 (L) 04/09/2023 0243   PROT 6.5 03/25/2013 0351   ALBUMIN  1.9 (L) 04/09/2023 0243   ALBUMIN  1.2 (L) 03/25/2013 0351   AST 23 04/09/2023 0243   AST 45 (H) 03/25/2013 0351   ALT 24 04/09/2023 0243   ALT 35 03/25/2013 0351   ALKPHOS 47 04/09/2023 0243   ALKPHOS 71 03/25/2013 0351   BILITOT 1.8 (H) 04/09/2023 0243   BILITOT 0.3 03/25/2013 0351   GFRNONAA >60 04/13/2023 0527   GFRNONAA 60 (L) 09/25/2014 0846   GFRNONAA >60 03/16/2014 0750     No results found.     Assessment & Plan:   1. Bilateral carotid artery stenosis (Primary) Recommend:  Given the patient's asymptomatic subcritical stenosis no further invasive testing or surgery at this time.  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  He has antegrade flow in the left vertebral with no discernible flow in the right  Continue antiplatelet therapy as prescribed Continue management of CAD, HTN and  Hyperlipidemia Healthy heart diet,  encouraged exercise at least 4 times per week  Follow up in 12 months with duplex ultrasound and physical exam   2. Hyperlipidemia, unspecified hyperlipidemia type Continue statin as ordered and reviewed, no changes at this time  3. Essential hypertension Continue antihypertensive medications as already ordered, these medications have been reviewed and there are no changes at this time.   Current Outpatient Medications on File Prior to Visit  Medication Sig Dispense Refill   acetaminophen  (TYLENOL ) 325 MG tablet Take 650 mg by mouth every 4 (four) hours as needed for fever.      ALPHAGAN  P 0.1 % SOLN Place 1 drop into the right eye 3 (three) times daily.     apixaban  (ELIQUIS ) 2.5 MG TABS tablet Take 1 tablet (2.5 mg total) by mouth 2 (two) times daily. 60 tablet 3   ascorbic acid  (VITAMIN C ) 500 MG tablet Take 500 mg by mouth 2 (two) times daily.     aspirin  EC 81 MG EC tablet Take 1 tablet (81 mg total) by mouth daily. Swallow whole. 90 tablet 3   atorvastatin  (LIPITOR) 10 MG tablet Take 1 tablet (10 mg total) by mouth daily. (Patient taking differently: Take 5 mg by mouth daily.) 30 tablet 11   baclofen  (LIORESAL ) 10 MG tablet Take 10 mg by mouth 3 (three) times daily. Hold for sedation     Cholecalciferol (VITAMIN D3) 50 MCG (2000 UT) TABS Take 4,000 Int'l  Units by mouth daily.     docusate sodium  (COLACE) 100 MG capsule Take 100 mg by mouth daily as needed for mild constipation.     DULoxetine  (CYMBALTA ) 20 MG capsule Take 40 mg by mouth daily.     fentaNYL  (DURAGESIC ) 25 MCG/HR Place 1 patch onto the skin every 3 (three) days.     ferrous sulfate  324 (65 Fe) MG TBEC Take 324 mg by mouth daily in the afternoon.     Fluticasone -Umeclidin-Vilant 100-62.5-25 MCG/ACT AEPB Inhale 1 puff into the lungs every morning.     furosemide  (LASIX ) 40 MG tablet Take 40 mg by mouth daily.     gabapentin  (NEURONTIN ) 100 MG capsule Take 100 mg by mouth every 12  (twelve) hours.     GOODSENSE ARTIFICIAL TEARS 0.5-0.6 % SOLN Place into the right eye 4 (four) times daily.     guaiFENesin  (ROBITUSSIN) 100 MG/5ML SOLN Take 15 mLs by mouth 3 (three) times daily as needed for cough (congestion).     ketorolac  (ACULAR ) 0.4 % SOLN Place 1 drop into the right eye 2 (two) times daily.     loperamide  (IMODIUM ) 2 MG capsule Take 2 mg by mouth 3 (three) times daily as needed for diarrhea or loose stools.     nitroGLYCERIN  (NITROSTAT ) 0.4 MG SL tablet Place 0.14 mg under the tongue every 5 (five) minutes as needed for chest pain.     Omega-3 Fatty Acids (FISH OIL) 1000 MG CAPS Take 1,000 mg by mouth daily.      Oxycodone  HCl 10 MG TABS Take 10 mg by mouth as needed.     OXYGEN  Inhale 1 L into the lungs at bedtime.     oxymetazoline  (AFRIN) 0.05 % nasal spray Place 2 sprays into both nostrils 2 (two) times daily as needed (For nose bleeds).     pantoprazole  (PROTONIX ) 40 MG tablet Take 40 mg by mouth 2 (two) times daily.     potassium chloride  SA (KLOR-CON  M) 20 MEQ tablet Take 1 tablet (20 mEq total) by mouth 2 (two) times daily.     sodium chloride  (OCEAN) 0.65 % SOLN nasal spray Place 1 spray into both nostrils 2 (two) times daily as needed for congestion.     spiritus frumenti (ETHYL ALCOHOL) SOLN 3 oz of bourbon by mouth at bed time as needed for sleep, pleasure.     tamsulosin  (FLOMAX ) 0.4 MG CAPS capsule Take 1 capsule (0.4 mg total) by mouth daily. 30 capsule 0   timolol  (TIMOPTIC ) 0.5 % ophthalmic solution Place 1 drop into the right eye daily.     No current facility-administered medications on file prior to visit.    There are no Patient Instructions on file for this visit. No follow-ups on file.   Cadan Maggart E Kaycen Whitworth, NP

## 2024-08-22 ENCOUNTER — Ambulatory Visit: Admitting: Student in an Organized Health Care Education/Training Program

## 2025-04-05 ENCOUNTER — Ambulatory Visit (INDEPENDENT_AMBULATORY_CARE_PROVIDER_SITE_OTHER): Admitting: Nurse Practitioner

## 2025-04-05 ENCOUNTER — Encounter (INDEPENDENT_AMBULATORY_CARE_PROVIDER_SITE_OTHER)
